# Patient Record
Sex: Male | Born: 1945 | Race: Black or African American | Hispanic: No | Marital: Single | Smoking: Current every day smoker
Health system: Southern US, Community
[De-identification: ages and names within clinical notes are randomized; demographics above are authoritative.]

## PROBLEM LIST (undated history)

## (undated) ENCOUNTER — Emergency Department (HOSPITAL_COMMUNITY): Admission: EM | Payer: Medicare Other | Source: Home / Self Care

## (undated) DIAGNOSIS — K219 Gastro-esophageal reflux disease without esophagitis: Secondary | ICD-10-CM

## (undated) DIAGNOSIS — R109 Unspecified abdominal pain: Secondary | ICD-10-CM

## (undated) DIAGNOSIS — I10 Essential (primary) hypertension: Secondary | ICD-10-CM

## (undated) DIAGNOSIS — IMO0001 Reserved for inherently not codable concepts without codable children: Secondary | ICD-10-CM

## (undated) DIAGNOSIS — R634 Abnormal weight loss: Secondary | ICD-10-CM

## (undated) DIAGNOSIS — M25569 Pain in unspecified knee: Secondary | ICD-10-CM

## (undated) DIAGNOSIS — F329 Major depressive disorder, single episode, unspecified: Secondary | ICD-10-CM

## (undated) DIAGNOSIS — F32A Depression, unspecified: Secondary | ICD-10-CM

## (undated) DIAGNOSIS — B1920 Unspecified viral hepatitis C without hepatic coma: Secondary | ICD-10-CM

## (undated) DIAGNOSIS — K769 Liver disease, unspecified: Secondary | ICD-10-CM

## (undated) DIAGNOSIS — R05 Cough: Secondary | ICD-10-CM

## (undated) DIAGNOSIS — F509 Eating disorder, unspecified: Secondary | ICD-10-CM

## (undated) DIAGNOSIS — R002 Palpitations: Secondary | ICD-10-CM

## (undated) DIAGNOSIS — I708 Atherosclerosis of other arteries: Secondary | ICD-10-CM

## (undated) DIAGNOSIS — M1990 Unspecified osteoarthritis, unspecified site: Secondary | ICD-10-CM

## (undated) DIAGNOSIS — F419 Anxiety disorder, unspecified: Secondary | ICD-10-CM

## (undated) HISTORY — DX: Essential (primary) hypertension: I10

## (undated) HISTORY — DX: Major depressive disorder, single episode, unspecified: F32.9

## (undated) HISTORY — DX: Pain in unspecified knee: M25.569

## (undated) HISTORY — DX: Eating disorder, unspecified: F50.9

## (undated) HISTORY — DX: Palpitations: R00.2

## (undated) HISTORY — DX: Unspecified abdominal pain: R10.9

## (undated) HISTORY — DX: Atherosclerosis of other arteries: I70.8

## (undated) HISTORY — DX: Abnormal weight loss: R63.4

## (undated) HISTORY — DX: Depression, unspecified: F32.A

## (undated) HISTORY — DX: Cough: R05

## (undated) HISTORY — DX: Liver disease, unspecified: K76.9

## (undated) HISTORY — DX: Unspecified viral hepatitis C without hepatic coma: B19.20

---

## 1988-12-13 HISTORY — PX: TUMOR REMOVAL: SHX12

## 1998-05-26 ENCOUNTER — Emergency Department (HOSPITAL_COMMUNITY): Admission: EM | Admit: 1998-05-26 | Discharge: 1998-05-26 | Payer: Self-pay | Admitting: Emergency Medicine

## 2000-05-24 ENCOUNTER — Ambulatory Visit (HOSPITAL_COMMUNITY): Admission: RE | Admit: 2000-05-24 | Discharge: 2000-05-24 | Payer: Self-pay | Admitting: Gastroenterology

## 2001-04-28 ENCOUNTER — Encounter: Admission: RE | Admit: 2001-04-28 | Discharge: 2001-04-28 | Payer: Self-pay | Admitting: *Deleted

## 2001-04-28 ENCOUNTER — Encounter: Payer: Self-pay | Admitting: *Deleted

## 2002-02-19 ENCOUNTER — Ambulatory Visit (HOSPITAL_COMMUNITY): Admission: RE | Admit: 2002-02-19 | Discharge: 2002-02-19 | Payer: Self-pay | Admitting: *Deleted

## 2005-01-19 ENCOUNTER — Encounter: Admission: RE | Admit: 2005-01-19 | Discharge: 2005-01-19 | Payer: Self-pay | Admitting: Internal Medicine

## 2005-02-16 ENCOUNTER — Encounter: Admission: RE | Admit: 2005-02-16 | Discharge: 2005-02-16 | Payer: Self-pay | Admitting: Internal Medicine

## 2005-07-13 ENCOUNTER — Ambulatory Visit: Payer: Self-pay | Admitting: Internal Medicine

## 2011-01-03 ENCOUNTER — Encounter: Payer: Self-pay | Admitting: Internal Medicine

## 2011-06-03 ENCOUNTER — Other Ambulatory Visit: Payer: Self-pay | Admitting: Gastroenterology

## 2011-06-03 ENCOUNTER — Ambulatory Visit (INDEPENDENT_AMBULATORY_CARE_PROVIDER_SITE_OTHER): Payer: Medicare Other | Admitting: Gastroenterology

## 2011-06-03 VITALS — BP 166/97 | HR 76 | Temp 97.7°F | Ht 71.0 in | Wt 174.0 lb

## 2011-06-03 DIAGNOSIS — B182 Chronic viral hepatitis C: Secondary | ICD-10-CM

## 2011-06-03 LAB — COMPREHENSIVE METABOLIC PANEL
ALT: 51 U/L (ref 0–53)
AST: 50 U/L — ABNORMAL HIGH (ref 0–37)
Albumin: 4.1 g/dL (ref 3.5–5.2)
Alkaline Phosphatase: 65 U/L (ref 39–117)
BUN: 13 mg/dL (ref 6–23)
CO2: 24 mEq/L (ref 19–32)
Calcium: 9.5 mg/dL (ref 8.4–10.5)
Chloride: 106 mEq/L (ref 96–112)
Creat: 0.8 mg/dL (ref 0.50–1.35)
Glucose, Bld: 93 mg/dL (ref 70–99)
Potassium: 4.3 mEq/L (ref 3.5–5.3)
Sodium: 139 mEq/L (ref 135–145)
Total Bilirubin: 0.5 mg/dL (ref 0.3–1.2)
Total Protein: 7.5 g/dL (ref 6.0–8.3)

## 2011-06-03 LAB — IRON AND TIBC
%SAT: 40 % (ref 20–55)
Iron: 147 ug/dL (ref 42–165)
TIBC: 372 ug/dL (ref 215–435)
UIBC: 225 ug/dL

## 2011-06-04 LAB — HEPATITIS A ANTIBODY, TOTAL: Hep A Total Ab: POSITIVE — AB

## 2011-06-04 LAB — HEPATITIS B SURFACE ANTIGEN: Hepatitis B Surface Ag: NEGATIVE

## 2011-06-04 LAB — TSH: TSH: 0.864 u[IU]/mL (ref 0.350–4.500)

## 2011-06-04 LAB — PROTIME-INR
INR: 0.93 (ref <1.50)
Prothrombin Time: 12.8 seconds (ref 11.6–15.2)

## 2011-06-04 LAB — HEPATITIS B CORE ANTIBODY, TOTAL: Hep B Core Total Ab: POSITIVE — AB

## 2011-06-04 LAB — ANA: Anti Nuclear Antibody(ANA): NEGATIVE

## 2011-06-04 LAB — FERRITIN: Ferritin: 1349 ng/mL — ABNORMAL HIGH (ref 22–322)

## 2011-06-04 LAB — HEPATITIS B SURFACE ANTIBODY,QUALITATIVE: Hep B S Ab: NEGATIVE

## 2011-06-04 LAB — AFP TUMOR MARKER: AFP-Tumor Marker: 16.5 ng/mL — ABNORMAL HIGH (ref 0.0–8.0)

## 2011-06-10 LAB — HEPATITIS C GENOTYPE

## 2011-06-10 NOTE — Progress Notes (Addendum)
NAME:  Ronald Wolf, Ronald Wolf  MR#:  [id number]      DATE:  06/03/2011  DOB:  04/20/1946    Referring Physician   cc: Dorothyann Peng MD, [facility], [address], [address], [place], [place]  [address]   Fax: [phone]  Larey Dresser, MD, [facility], [facility], [place]. [address], [address], [place], [place] [address]  Fax: [phone]   Reason for referral:  Genotype unknown hepatitis C virus.    History of present illness:  The patient is a 65 year old gentleman whom I have been asked by Dr. Allyne Gee to see in consultation regarding genotype unknown hepatitis C.  On 07/13/2005, the patient was seen at this clinic regarding his  hepatitis C.  The notes indicate that he was positive for the hepatitis C antibody in February 2006 with an ALT of 56. He reported that he had had hepatitis C viral testing the week prior to his  consultation on July 13, 2005, and therefore it was not drawn. Old records were requested, and it appears that the only laboratories that were done were his liver enzymes, but no viral load or genotyping were  done.  The patient was supposed to return in followup. There is no indication from the records as to what happened.  The patient claims that he never received a followup appointment.  He now returns at the request of Dr. Allyne Gee to discuss his hepatitis C. There are no symptoms referable to his history of hepatitis C, and there is some to suggest cryoglobulin mediated or decompensated liver  disease.  With respect to risk factors for liver disease, he reports drinking a pint of alcohol twice a week, with his last drink being Monday. He believes that this a significant reduction in his previous use. It  should be noted that his referral notes from 02/15/2011 state that he smelled of alcohol. He had 3 DWIs, the last being over 10 years ago. There is no history of intravenous or intranasal drug use, tattoos,   unsterile body piercing, but he thought that he believes he had a blood transfusion in 1990 when he underwent surgery for bladder  cancer.  There is no family history of liver disease, and he cannot recall being immunized against hepatitis A or B.   PAST MEDICAL HISTORY:  Significant for hypertension. 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 He continues to follow up with [facility] for  this. He denies any history of diabetes, dyslipidemia, coronary disease .   Past surgical history:  Resection of bladder cancer in 1990.    Past psychiatric history:  In the 1990s he saw a therapist to deal with the impact of his bladder cancer, but he denies any history of depression other than as a reaction to his bladder cancer. The notes of 07/13/2005 indicate that  he was treated with fluoxetine but stopped it as I believe it did not help him.   CURRENT MEDICATIONS:  1. Losartan 50 mg p.o. daily.  2. Nitrofurantoin 100 mg nightly to prevent bacterial infections through the ureteral conduit.   Allergies:  Denies.     Smoking:  Approximately a pack of cigarettes per day.    Alcohol:  As above.   FAMILY HISTORY:  As above.   SOCIAL HISTORY:  Divorced.  Has 6 children. 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   REVIEW OF SYSTEMS:  All 10 systems reviewed today with the patient.  Negative other than that which is mentioned above. CES-D was 30.   PHYSICAL EXAMINATION:  Constitutional:  Appeared stated age without significant bitemporal wasting.   Vital signs:  Height 71 inches, weight 124 pounds, blood pressure 166/97, pulse 76, temperature 97.7 degrees Fahrenheit.   Ears, nose, mouth and throat:  Unremarkable oropharynx.    neck:  No thyromegaly or neck masses.    Chest:  Resonant to percussion.  Clear to auscultation.    Cardiovascular:  Heart sounds normal.  S1, S2 without murmurs or rubs.   There is no peripheral edema.    abdomen:  Normal bowel sounds.  No masses or tenderness.  I could not appreciate a liver edge or spleen tip.  I could not appreciate any hernias.    Lymphatics:  No cervical or inguinal lymphadenopathy.    Central Nervous System:  No asterixis or focal neurologic findings.    Dermatologic:  Anicteric without palmar erythema or spider angiomata.    Eyes:  Anicteric sclerae.  Pupils are equal and reactive to light.   LABORATORY STUDIES:  From 02/15/2011, creatinine was 0.68.  AST 92, ALT 77, ALP 86, total bilirubin 0.6, albumin 4.3.  Triglycerides 166. GGG 427.  From  02/25/2010, platelet count was 288. Albumin 3.9, globulin 3.8, AST 60, ALT 54, ALP 88, total bilirubin 0.8, creatinine was 0.8.   IMAGING STUDIES:  Last imaging of the liver on 02/16/2005 with contrast showed 2 low-attenuation lesion within liver and a third smaller lesion near the capsule in the left hepatic lobe, which were indeterminate in nature  but thought to probably be cysts.  There is also hyperdensity within the liver suggesting fatty infiltration.    assessment:  The patient is a 65 year old gentleman with a history of genotype unknown hepatitis C virus, with a history of significant alcohol use as well. I have concerns about his ongoing alcohol use, such that it  may preclude him from being considered for treatment.  In addition, he has been lost to followup for 6 years now, so I need to see how compliant he will be, because compliance with therapy is key to  success.  There is a history of depression in the past, and one wonders if he is self-medicating with alcohol.  In my discussion today with the patient, we discussed the nature and natural history of hepatitis C. We discussed the significance of genotyping.  We discussed the role of biopsy for genotype 1. We  discussed treatment with pegylated interferon and ribavirin for all genotypes, and a protease inhibitor in addition for  genotype 1 patients.  I reviewed the success rates, our treatment protocol, and  the side effects of treatment. We also discussed the risks of contagion.  I discussed the interactions with alcohol use and abuse  and cirrhosis from hepatitis C.  I have told him that he must completely abstain from alcohol.   PLAN:  1. Standard laboratories including genotyping him. 2. If genotype 1, will consider proceeding to liver biopsy to demonstrate the significance of disease and then bring him back to clinic to followup. 3. If genotype non-1, will bring back to clinic to followup to see if he has made any progress with his abstinence as well as compliance with follow up. 4. Literature on hepatitis C given.              Brooke Dare, MD  ADDENDUM: Genotype 1a.  Will book for biopsy.   Hepatitis A immune.   Total Hepatitis B core Ab positive - will consider this a true positive and not immunize.  Ferritin 1349, likely considering his iron saturation of 40%, a reflection of HCV and alcoholic liver disease.  Will assess iron on biopsy.  403 .[id number]  D:  Thu Jun 21 20:26:47 2012 ; T:  Sat Jun 23 14:24:48 2012  Job #:  [id number]

## 2012-02-08 ENCOUNTER — Other Ambulatory Visit: Payer: Self-pay | Admitting: Gastroenterology

## 2012-02-08 DIAGNOSIS — R6881 Early satiety: Secondary | ICD-10-CM

## 2012-02-08 DIAGNOSIS — R1013 Epigastric pain: Secondary | ICD-10-CM

## 2012-02-08 DIAGNOSIS — R634 Abnormal weight loss: Secondary | ICD-10-CM

## 2012-02-15 ENCOUNTER — Other Ambulatory Visit (HOSPITAL_COMMUNITY): Payer: Medicare Other

## 2012-04-13 ENCOUNTER — Other Ambulatory Visit (HOSPITAL_COMMUNITY): Payer: Self-pay | Admitting: Urology

## 2012-04-13 ENCOUNTER — Other Ambulatory Visit (HOSPITAL_COMMUNITY): Payer: Medicare Other

## 2012-04-13 DIAGNOSIS — Z8551 Personal history of malignant neoplasm of bladder: Secondary | ICD-10-CM

## 2012-04-14 ENCOUNTER — Ambulatory Visit (HOSPITAL_COMMUNITY)
Admission: RE | Admit: 2012-04-14 | Discharge: 2012-04-14 | Disposition: A | Discharge status: Home / Self Care | Payer: Medicare Other | Source: Ambulatory Visit | Attending: Urology | Admitting: Urology

## 2012-04-14 DIAGNOSIS — Z9889 Other specified postprocedural states: Secondary | ICD-10-CM | POA: Insufficient documentation

## 2012-04-14 DIAGNOSIS — Z8551 Personal history of malignant neoplasm of bladder: Secondary | ICD-10-CM

## 2012-04-14 DIAGNOSIS — C679 Malignant neoplasm of bladder, unspecified: Secondary | ICD-10-CM | POA: Insufficient documentation

## 2012-05-22 ENCOUNTER — Encounter: Payer: Self-pay | Admitting: Internal Medicine

## 2012-05-22 ENCOUNTER — Ambulatory Visit (INDEPENDENT_AMBULATORY_CARE_PROVIDER_SITE_OTHER): Payer: Medicare Other | Admitting: Internal Medicine

## 2012-05-22 VITALS — BP 124/72 | HR 79 | Temp 98.4°F | Resp 18 | Ht 70.0 in | Wt 161.0 lb

## 2012-05-22 DIAGNOSIS — B1920 Unspecified viral hepatitis C without hepatic coma: Secondary | ICD-10-CM

## 2012-05-22 DIAGNOSIS — G500 Trigeminal neuralgia: Secondary | ICD-10-CM

## 2012-05-22 DIAGNOSIS — C679 Malignant neoplasm of bladder, unspecified: Secondary | ICD-10-CM

## 2012-05-22 MED ORDER — CARBAMAZEPINE ER 100 MG PO TB12: 100.0000 mg | ORAL_TABLET | Freq: Two times a day (BID) | ORAL | Status: DC

## 2012-05-28 DIAGNOSIS — G500 Trigeminal neuralgia: Secondary | ICD-10-CM | POA: Insufficient documentation

## 2012-05-28 DIAGNOSIS — C679 Malignant neoplasm of bladder, unspecified: Secondary | ICD-10-CM | POA: Insufficient documentation

## 2012-05-28 DIAGNOSIS — B1920 Unspecified viral hepatitis C without hepatic coma: Secondary | ICD-10-CM | POA: Insufficient documentation

## 2012-05-28 NOTE — Progress Notes (Signed)
  Subjective:    Patient ID: Ronald Wolf, male    DOB: 1946-01-21, 66 y.o.   MRN: [id number]  HPI Pt presents to clinic for evaluation of facial pain. Notes chronic intermittent for 15 years of severe facial pain. Pain in unilateral left sided and lasts 20-30 secs. Occurs every few months and when active will be daily. Has attempted medication in the past but nothing currently. Also h/o hep c s/p ID with no follow up. Has not undergone treatment but it was being discussed. Has PMD and was unaware he was seeing IM physician today- was referred by a friend. No other complaints.  Past Medical History  Diagnosis Date  . Depression   . Eating disorder   . Hepatitis C   . Hypertension    Past Surgical History  Procedure Date  . Tumor removal 1990    bladder    reports that he has been smoking.  He has never used smokeless tobacco. He reports that he drinks alcohol. He reports that he does not use illicit drugs. family history includes Cancer in his brother; Diabetes in his mother; and Healthy in his sister.  There is no history of Prostate cancer, and Colon cancer, and Breast cancer, and Heart disease, and Hypertension, . Allergies  Allergen Reactions  . Iohexol      Desc: pt broke out in hives years ago from iv contrast. he was given 50mg  benadryl po, 1 hr prior to ct and did fine today w/o complications.  JB      Review of Systems  Neurological: Negative for dizziness, facial asymmetry, speech difficulty, weakness, numbness and headaches.  All other systems reviewed and are negative.       Objective:   Physical Exam  Nursing note and vitals reviewed. Constitutional: He appears well-developed and well-nourished. No distress.  HENT:  Head: Normocephalic and atraumatic.  Right Ear: External ear normal.  Left Ear: External ear normal.  Eyes: Conjunctivae are normal. No scleral icterus.  Neck: Neck supple.  Cardiovascular: Normal rate, regular rhythm and normal heart sounds.     Pulmonary/Chest: Effort normal and breath sounds normal.  Lymphadenopathy:    He has no cervical adenopathy.  Neurological: He is alert. No cranial nerve deficit.  Skin: Skin is warm and dry. No rash noted. He is not diaphoretic. No erythema.  Psychiatric: He has a normal mood and affect.          Assessment & Plan:

## 2012-05-28 NOTE — Assessment & Plan Note (Signed)
Begin tegretol. Schedule neurology consult. May f/u with pmd or here

## 2012-05-28 NOTE — Assessment & Plan Note (Signed)
Schedule follow up with ID

## 2012-06-12 ENCOUNTER — Other Ambulatory Visit: Payer: Self-pay | Admitting: Internal Medicine

## 2012-06-12 ENCOUNTER — Ambulatory Visit (INDEPENDENT_AMBULATORY_CARE_PROVIDER_SITE_OTHER): Payer: Medicare Other | Admitting: Internal Medicine

## 2012-06-12 ENCOUNTER — Encounter: Payer: Self-pay | Admitting: Internal Medicine

## 2012-06-12 VITALS — BP 142/76 | HR 84 | Temp 98.8°F | Resp 16 | Wt 168.2 lb

## 2012-06-12 DIAGNOSIS — Z79899 Other long term (current) drug therapy: Secondary | ICD-10-CM

## 2012-06-12 DIAGNOSIS — G500 Trigeminal neuralgia: Secondary | ICD-10-CM

## 2012-06-12 DIAGNOSIS — I10 Essential (primary) hypertension: Secondary | ICD-10-CM

## 2012-06-12 DIAGNOSIS — B1920 Unspecified viral hepatitis C without hepatic coma: Secondary | ICD-10-CM

## 2012-06-12 LAB — CBC WITH DIFFERENTIAL/PLATELET
Basophils Absolute: 0 10*3/uL (ref 0.0–0.1)
Basophils Relative: 1 % (ref 0–1)
Eosinophils Absolute: 0.1 10*3/uL (ref 0.0–0.7)
Eosinophils Relative: 2 % (ref 0–5)
HCT: 36.4 % — ABNORMAL LOW (ref 39.0–52.0)
Hemoglobin: 12.5 g/dL — ABNORMAL LOW (ref 13.0–17.0)
Lymphocytes Relative: 51 % — ABNORMAL HIGH (ref 12–46)
Lymphs Abs: 3.5 10*3/uL (ref 0.7–4.0)
MCH: 34.9 pg — ABNORMAL HIGH (ref 26.0–34.0)
MCHC: 34.3 g/dL (ref 30.0–36.0)
MCV: 101.7 fL — ABNORMAL HIGH (ref 78.0–100.0)
Monocytes Absolute: 0.9 10*3/uL (ref 0.1–1.0)
Monocytes Relative: 14 % — ABNORMAL HIGH (ref 3–12)
Neutro Abs: 2.3 10*3/uL (ref 1.7–7.7)
Neutrophils Relative %: 32 % — ABNORMAL LOW (ref 43–77)
Platelets: 266 10*3/uL (ref 150–400)
RBC: 3.58 MIL/uL — ABNORMAL LOW (ref 4.22–5.81)
RDW: 12.4 % (ref 11.5–15.5)
WBC: 6.8 10*3/uL (ref 4.0–10.5)

## 2012-06-12 LAB — HEPATIC FUNCTION PANEL
ALT: 36 U/L (ref 0–53)
AST: 36 U/L (ref 0–37)
Albumin: 3.9 g/dL (ref 3.5–5.2)
Alkaline Phosphatase: 60 U/L (ref 39–117)
Bilirubin, Direct: 0.1 mg/dL (ref 0.0–0.3)
Indirect Bilirubin: 0.4 mg/dL (ref 0.0–0.9)
Total Bilirubin: 0.5 mg/dL (ref 0.3–1.2)
Total Protein: 7.2 g/dL (ref 6.0–8.3)

## 2012-06-12 MED ORDER — CARBAMAZEPINE ER 100 MG PO TB12: 100.0000 mg | ORAL_TABLET | Freq: Two times a day (BID) | ORAL | Status: DC

## 2012-06-12 MED ORDER — LOSARTAN POTASSIUM 100 MG PO TABS: 100.0000 mg | ORAL_TABLET | Freq: Every day | ORAL | Status: DC

## 2012-06-12 NOTE — Progress Notes (Signed)
  Subjective:    Patient ID: Ronald Wolf, male    DOB: 1946/04/20, 66 y.o.   MRN: [id number]  HPI Pt presents to clinic for followup of multiple medical problems. Last visit began tegretol for suspected trigeminal neuralgia. Tolerates with mild intermittent dizziness. No syncope or falls. States facial pain entirely resolved since beginning medication-has only mild intermittent tingling. Neurology appt pending next week. H/o hep c and referral to re-establish with ID pending. BP mildly elevated and states just ran out of arb. Last lab work in January.   Past Medical History  Diagnosis Date  . Depression   . Eating disorder   . Hepatitis C   . Hypertension    Past Surgical History  Procedure Date  . Tumor removal 1990    bladder    reports that he has been smoking.  He has never used smokeless tobacco. He reports that he drinks alcohol. He reports that he does not use illicit drugs. family history includes Cancer in his brother; Diabetes in his mother; and Healthy in his sister.  There is no history of Prostate cancer, and Colon cancer, and Breast cancer, and Heart disease, and Hypertension, . Allergies  Allergen Reactions  . Iohexol      Desc: pt broke out in hives years ago from iv contrast. he was given 50mg  benadryl po, 1 hr prior to ct and did fine today w/o complications.  JB       Review of Systems see hpi     Objective:   Physical Exam  Nursing note and vitals reviewed. Constitutional: He appears well-developed and well-nourished. No distress.  HENT:  Head: Normocephalic and atraumatic.  Neurological: He is alert.  Skin: He is not diaphoretic.  Psychiatric: He has a normal mood and affect.          Assessment & Plan:

## 2012-06-12 NOTE — Assessment & Plan Note (Signed)
RF losartan. Obtain cbc, chem7, tsh.

## 2012-06-12 NOTE — Assessment & Plan Note (Signed)
ID appt pending. Obtain lft

## 2012-06-12 NOTE — Assessment & Plan Note (Signed)
Improved. Continue tegretol. Keep neurology appt given 15+ year hx of sx's.

## 2012-06-13 LAB — BASIC METABOLIC PANEL
BUN: 22 mg/dL (ref 6–23)
CO2: 18 mEq/L — ABNORMAL LOW (ref 19–32)
Calcium: 9.1 mg/dL (ref 8.4–10.5)
Chloride: 108 mEq/L (ref 96–112)
Creat: 1.01 mg/dL (ref 0.50–1.35)
Glucose, Bld: 101 mg/dL — ABNORMAL HIGH (ref 70–99)
Potassium: 4.2 mEq/L (ref 3.5–5.3)
Sodium: 139 mEq/L (ref 135–145)

## 2012-06-13 LAB — TSH: TSH: 2.329 u[IU]/mL (ref 0.350–4.500)

## 2012-06-16 ENCOUNTER — Telehealth: Payer: Self-pay | Admitting: *Deleted

## 2012-06-16 DIAGNOSIS — D531 Other megaloblastic anemias, not elsewhere classified: Secondary | ICD-10-CM

## 2012-06-16 LAB — VITAMIN B12

## 2012-06-16 NOTE — Telephone Encounter (Signed)
Message copied by Regis Bill on Fri Jun 16, 2012  1:07 PM ------      Message from: Edwyna Perfect      Created: Wed Jun 14, 2012 11:57 PM       pls see if [facility] can add vit b12 dx-megaloblastic anemia. Labs ok

## 2012-06-16 NOTE — Telephone Encounter (Signed)
Patient informed; will call when B12 lab results are received; per [facility], Vitamin B12 lab Dx: megaloblastic anemia has been added on to previous blood work/SLS

## 2012-06-19 NOTE — Telephone Encounter (Signed)
Can it be ordered with next visit pls

## 2012-06-19 NOTE — Telephone Encounter (Signed)
No future labs pending, will order at next office appt per Provider.

## 2012-06-19 NOTE — Telephone Encounter (Signed)
Received call from Sherri at [facility] stating they are unable to add B12 level as there was not enough specimen to perform the test.  Do we need to call pt back to the lab to complete this test?

## 2012-07-18 ENCOUNTER — Encounter: Payer: Self-pay | Admitting: Internal Medicine

## 2012-07-18 ENCOUNTER — Ambulatory Visit (HOSPITAL_BASED_OUTPATIENT_CLINIC_OR_DEPARTMENT_OTHER)
Admission: RE | Admit: 2012-07-18 | Discharge: 2012-07-18 | Disposition: A | Discharge status: Home / Self Care | Payer: Medicare Other | Source: Ambulatory Visit | Attending: Internal Medicine | Admitting: Internal Medicine

## 2012-07-18 ENCOUNTER — Other Ambulatory Visit: Payer: Self-pay | Admitting: Internal Medicine

## 2012-07-18 ENCOUNTER — Ambulatory Visit (INDEPENDENT_AMBULATORY_CARE_PROVIDER_SITE_OTHER): Payer: Medicare Other | Admitting: Internal Medicine

## 2012-07-18 VITALS — BP 118/64 | HR 88 | Temp 98.4°F | Resp 16 | Ht 71.0 in | Wt 163.0 lb

## 2012-07-18 DIAGNOSIS — R200 Anesthesia of skin: Secondary | ICD-10-CM

## 2012-07-18 DIAGNOSIS — R209 Unspecified disturbances of skin sensation: Secondary | ICD-10-CM | POA: Insufficient documentation

## 2012-07-18 DIAGNOSIS — Z1389 Encounter for screening for other disorder: Secondary | ICD-10-CM

## 2012-07-18 DIAGNOSIS — I10 Essential (primary) hypertension: Secondary | ICD-10-CM

## 2012-07-18 DIAGNOSIS — Z8551 Personal history of malignant neoplasm of bladder: Secondary | ICD-10-CM | POA: Insufficient documentation

## 2012-07-18 DIAGNOSIS — G500 Trigeminal neuralgia: Secondary | ICD-10-CM

## 2012-07-18 DIAGNOSIS — B1920 Unspecified viral hepatitis C without hepatic coma: Secondary | ICD-10-CM | POA: Insufficient documentation

## 2012-07-18 DIAGNOSIS — R6884 Jaw pain: Secondary | ICD-10-CM | POA: Insufficient documentation

## 2012-07-18 MED ORDER — GADOBENATE DIMEGLUMINE 529 MG/ML IV SOLN
15.0000 mL | Freq: Once | INTRAVENOUS | Status: AC | PRN
  Administered 2012-07-18: 15 mL via INTRAVENOUS

## 2012-07-18 MED ORDER — CARBAMAZEPINE ER 100 MG PO TB12: 100.0000 mg | ORAL_TABLET | Freq: Two times a day (BID) | ORAL | Status: DC

## 2012-07-18 NOTE — Progress Notes (Signed)
  Subjective:    Patient ID: Ronald Wolf, male    DOB: September 26, 1946, 66 y.o.   MRN: [id number]  HPI Pt presents to clinic for followup of multiple medical problems. Notes previously improved unilateral facial pain and numbness has now worsened despite tegretol. Now radiates to his forehead. Remains left sided and does not cross the midline. Duration of sx's has been years. Tolerating tegretol without side effects. No other neurologic sx's. Did not keep appointment with neurology.BP reviewed normotensive and tolerating ARB without side effects.  Past Medical History  Diagnosis Date  . Depression   . Eating disorder   . Hepatitis C   . Hypertension    Past Surgical History  Procedure Date  . Tumor removal 1990    bladder    reports that he has been smoking.  He has never used smokeless tobacco. He reports that he drinks alcohol. He reports that he does not use illicit drugs. family history includes Cancer in his brother; Diabetes in his mother; and Healthy in his sister.  There is no history of Prostate cancer, and Colon cancer, and Breast cancer, and Heart disease, and Hypertension, . Allergies  Allergen Reactions  . Iohexol      Desc: pt broke out in hives years ago from iv contrast. he was given 50mg  benadryl po, 1 hr prior to ct and did fine today w/o complications.  JB       Review of Systems see hpi     Objective:   Physical Exam  Nursing note and vitals reviewed. Constitutional: He appears well-developed and well-nourished.  HENT:  Head: Normocephalic and atraumatic.  Right Ear: External ear normal.  Left Ear: External ear normal.  Eyes: Conjunctivae and EOM are normal. No scleral icterus.  Neurological: He is alert.  Psychiatric: He has a normal mood and affect.          Assessment & Plan:

## 2012-07-18 NOTE — Patient Instructions (Addendum)
We are in the process of scheduling an MRI of the brain and a neurology appointment for you

## 2012-07-19 DIAGNOSIS — R200 Anesthesia of skin: Secondary | ICD-10-CM | POA: Insufficient documentation

## 2012-07-19 NOTE — Assessment & Plan Note (Signed)
Suspected trigeminal neuralgia however chronicity and worsening sx's. Proceed with MRI of head and reschedule neurology consult. Continue tegretol.

## 2012-07-19 NOTE — Assessment & Plan Note (Signed)
Normotensive and stable. Continue current regimen. Monitor bp as outpt and followup in clinic as scheduled.  

## 2012-09-14 ENCOUNTER — Ambulatory Visit (HOSPITAL_BASED_OUTPATIENT_CLINIC_OR_DEPARTMENT_OTHER)
Admission: RE | Admit: 2012-09-14 | Discharge: 2012-09-14 | Disposition: A | Discharge status: Home / Self Care | Payer: Medicare Other | Source: Ambulatory Visit | Attending: Internal Medicine | Admitting: Internal Medicine

## 2012-09-14 ENCOUNTER — Encounter: Payer: Self-pay | Admitting: Internal Medicine

## 2012-09-14 ENCOUNTER — Ambulatory Visit (INDEPENDENT_AMBULATORY_CARE_PROVIDER_SITE_OTHER): Payer: Medicare Other | Admitting: Internal Medicine

## 2012-09-14 VITALS — BP 136/80 | HR 79 | Temp 97.8°F | Resp 16 | Ht 70.0 in | Wt 170.0 lb

## 2012-09-14 DIAGNOSIS — R079 Chest pain, unspecified: Secondary | ICD-10-CM

## 2012-09-14 DIAGNOSIS — D531 Other megaloblastic anemias, not elsewhere classified: Secondary | ICD-10-CM

## 2012-09-14 DIAGNOSIS — G500 Trigeminal neuralgia: Secondary | ICD-10-CM

## 2012-09-14 DIAGNOSIS — Z125 Encounter for screening for malignant neoplasm of prostate: Secondary | ICD-10-CM

## 2012-09-14 DIAGNOSIS — Z23 Encounter for immunization: Secondary | ICD-10-CM

## 2012-09-14 DIAGNOSIS — D539 Nutritional anemia, unspecified: Secondary | ICD-10-CM

## 2012-09-14 DIAGNOSIS — M899 Disorder of bone, unspecified: Secondary | ICD-10-CM

## 2012-09-14 DIAGNOSIS — D649 Anemia, unspecified: Secondary | ICD-10-CM

## 2012-09-14 LAB — CBC WITH DIFFERENTIAL/PLATELET
Basophils Absolute: 0 10*3/uL (ref 0.0–0.1)
Basophils Relative: 0 % (ref 0–1)
Eosinophils Absolute: 0.1 10*3/uL (ref 0.0–0.7)
Eosinophils Relative: 2 % (ref 0–5)
HCT: 36.8 % — ABNORMAL LOW (ref 39.0–52.0)
Hemoglobin: 12.3 g/dL — ABNORMAL LOW (ref 13.0–17.0)
Lymphocytes Relative: 47 % — ABNORMAL HIGH (ref 12–46)
Lymphs Abs: 3.2 10*3/uL (ref 0.7–4.0)
MCH: 35.5 pg — ABNORMAL HIGH (ref 26.0–34.0)
MCHC: 33.4 g/dL (ref 30.0–36.0)
MCV: 106.4 fL — ABNORMAL HIGH (ref 78.0–100.0)
Monocytes Absolute: 1 10*3/uL (ref 0.1–1.0)
Monocytes Relative: 14 % — ABNORMAL HIGH (ref 3–12)
Neutro Abs: 2.5 10*3/uL (ref 1.7–7.7)
Neutrophils Relative %: 37 % — ABNORMAL LOW (ref 43–77)
Platelets: 264 10*3/uL (ref 150–400)
RBC: 3.46 MIL/uL — ABNORMAL LOW (ref 4.22–5.81)
RDW: 12.5 % (ref 11.5–15.5)
WBC: 6.7 10*3/uL (ref 4.0–10.5)

## 2012-09-14 LAB — VITAMIN B12: Vitamin B-12: 1392 pg/mL — ABNORMAL HIGH (ref 211–911)

## 2012-09-14 LAB — FERRITIN: Ferritin: 1649 ng/mL — ABNORMAL HIGH (ref 22–322)

## 2012-09-14 NOTE — Patient Instructions (Addendum)
We are in the process of scheduling a heart stress test for you

## 2012-09-15 LAB — PSA, MEDICARE: PSA: 0.02 ng/mL (ref <=4.00)

## 2012-09-17 DIAGNOSIS — M899 Disorder of bone, unspecified: Secondary | ICD-10-CM | POA: Insufficient documentation

## 2012-09-17 DIAGNOSIS — R079 Chest pain, unspecified: Secondary | ICD-10-CM | POA: Insufficient documentation

## 2012-09-17 DIAGNOSIS — D649 Anemia, unspecified: Secondary | ICD-10-CM | POA: Insufficient documentation

## 2012-09-17 NOTE — Assessment & Plan Note (Signed)
Obtain PSA. Discussed potential bone scan and we'll readdress at close followup after cardiac evaluation

## 2012-09-17 NOTE — Assessment & Plan Note (Addendum)
Reschedule neurology consult

## 2012-09-17 NOTE — Progress Notes (Signed)
  Subjective:    Patient ID: Ronald Wolf, male    DOB: September 15, 1946, 66 y.o.   MRN: [id number]  HPI Pt presents to clinic for followup of multiple medical problems. Continues to have neuropathic pain felt to be trigeminal  neuralgia of left face. Two attempts to refer to neurology the patient has not kept appointment. Willing to have appointment rescheduled. 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 States colonoscopy approximately 3 years ago. Also notes left sided chest pain intermittently occurring sometimes at night. Has associated left arm numbness. Symptoms are nonexertional. There is been at least one occasion where belching or taking TUMS helped. Denies GERD symptoms.  Past Medical History  Diagnosis Date  . Depression   . Eating disorder   . Hepatitis C   . Hypertension    Past Surgical History  Procedure Date  . Tumor removal 1990    bladder    reports that he has been smoking.  He has never used smokeless tobacco. He reports that he drinks alcohol. He reports that he does not use illicit drugs. family history includes Cancer in his brother; Diabetes in his mother; and Healthy in his sister.  There is no history of Prostate cancer, and Colon cancer, and Breast cancer, and Heart disease, and Hypertension, . Allergies  Allergen Reactions  . Iohexol      Desc: pt broke out in hives years ago from iv contrast. he was given 50mg  benadryl po, 1 hr prior to ct and did fine today w/o complications.  JB       Review of Systems see history of present illness     Objective:   Physical Exam  Nursing note and vitals reviewed. Constitutional: He appears well-developed and well-nourished. No distress.  HENT:  Head: Normocephalic and atraumatic.  Right Ear: External ear normal.  Eyes: Conjunctivae normal are normal. No scleral icterus.  Neck: Neck supple.  Cardiovascular: Normal rate, regular rhythm and normal heart sounds.  Exam reveals no  gallop and no friction rub.   No murmur heard. Pulmonary/Chest: Effort normal and breath sounds normal. No respiratory distress. He has no wheezes. He has no rales.  Neurological: He is alert.  Skin: Skin is warm and dry. He is not diaphoretic.  Psychiatric: He has a normal mood and affect.          Assessment & Plan:

## 2012-09-17 NOTE — Assessment & Plan Note (Signed)
Obtain CBC B12 and iron level

## 2012-09-17 NOTE — Assessment & Plan Note (Signed)
Does have both typical and atypical features. EKG obtained demonstrates normal sinus rhythm with a rate of seventy-seven. Normal axis and interval. No evidence of acute ischemic change. Obtain chest x-ray and schedule nuclear stress test. Given samples of Nexium 40 mg daily. Close followup scheduled

## 2012-09-21 ENCOUNTER — Other Ambulatory Visit: Payer: Self-pay | Admitting: Internal Medicine

## 2012-09-21 DIAGNOSIS — R079 Chest pain, unspecified: Secondary | ICD-10-CM

## 2012-09-29 ENCOUNTER — Telehealth: Payer: Self-pay | Admitting: *Deleted

## 2012-09-29 NOTE — Telephone Encounter (Signed)
Pt called stating tegretol is no longer helping his facial pain. He reports that he took 5 of them yesterday with no relief. Pt states the pain is almost unbearable and wants to know what else he can do. Pt reports that he doesn't want to go back to previous neurology group because they did not help him before.  Please advise.

## 2012-10-02 ENCOUNTER — Other Ambulatory Visit: Payer: Self-pay | Admitting: Internal Medicine

## 2012-10-02 DIAGNOSIS — G518 Other disorders of facial nerve: Secondary | ICD-10-CM

## 2012-10-02 NOTE — Telephone Encounter (Signed)
Referral order placed.

## 2012-10-02 NOTE — Telephone Encounter (Signed)
Per VO, [facility], pt can get new referral to Neurology only if he keeps this appointment made, has had previous missed OVs that were scheduled and there will be no future referrals if this is not complied with/SLS

## 2012-10-02 NOTE — Telephone Encounter (Signed)
Patient states that he does want to be referred to a neurologist but not the one that Dr. Rodena Medin has previously referred him to.

## 2012-10-05 ENCOUNTER — Ambulatory Visit (INDEPENDENT_AMBULATORY_CARE_PROVIDER_SITE_OTHER): Payer: Medicare Other | Admitting: Internal Medicine

## 2012-10-05 ENCOUNTER — Encounter: Payer: Medicare Other | Admitting: Physician Assistant

## 2012-10-05 ENCOUNTER — Encounter: Payer: Self-pay | Admitting: Internal Medicine

## 2012-10-05 DIAGNOSIS — G500 Trigeminal neuralgia: Secondary | ICD-10-CM

## 2012-10-05 DIAGNOSIS — M898X1 Other specified disorders of bone, shoulder: Secondary | ICD-10-CM | POA: Insufficient documentation

## 2012-10-05 DIAGNOSIS — J310 Chronic rhinitis: Secondary | ICD-10-CM

## 2012-10-05 DIAGNOSIS — G8929 Other chronic pain: Secondary | ICD-10-CM | POA: Insufficient documentation

## 2012-10-05 DIAGNOSIS — I10 Essential (primary) hypertension: Secondary | ICD-10-CM

## 2012-10-05 DIAGNOSIS — R079 Chest pain, unspecified: Secondary | ICD-10-CM

## 2012-10-05 DIAGNOSIS — M899 Disorder of bone, unspecified: Secondary | ICD-10-CM

## 2012-10-05 LAB — IRON: Iron: 151 ug/dL (ref 42–165)

## 2012-10-05 MED ORDER — TRAMADOL HCL 50 MG PO TABS: ORAL_TABLET | ORAL | Status: AC

## 2012-10-05 MED ORDER — FLUTICASONE PROPIONATE 50 MCG/ACT NA SUSP: NASAL | Status: DC

## 2012-10-05 MED ORDER — DICLOFENAC SODIUM 75 MG PO TBEC: DELAYED_RELEASE_TABLET | ORAL | Status: AC

## 2012-10-05 MED ORDER — AMLODIPINE BESYLATE 5 MG PO TABS: ORAL_TABLET | ORAL | Status: DC

## 2012-10-05 MED ORDER — TRAMADOL HCL 50 MG PO TABS: ORAL_TABLET | ORAL | Status: DC

## 2012-10-05 NOTE — Assessment & Plan Note (Signed)
Temporary Ultram when necessary for pain. Keep neurology appointment. Cranial MRI without evidence of mass.

## 2012-10-05 NOTE — Assessment & Plan Note (Signed)
Attempt Flonase daily

## 2012-10-05 NOTE — Assessment & Plan Note (Signed)
Provided with additional Nexium samples and encouraged to take daily. Stress test scheduled

## 2012-10-05 NOTE — Assessment & Plan Note (Signed)
At next visit discussed possible bone scan for followup of indeterminate C2 area noted on MRI

## 2012-10-05 NOTE — Progress Notes (Signed)
  Subjective:    Patient ID: Ronald Wolf, male    DOB: 10/30/1946, 66 y.o.   MRN: [id number]  HPI Pt presents to clinic for followup of multiple medical problems. Blood pressure mildly elevated however states has not taken medication today. Home monitoring demonstrates systolic blood pressure in the 140's range. Compliant with losartan daily without adverse effect. Initially scheduled for nuclear stress test due to intermittent chest pain however test was denied by his insurance company. Has been rescheduled for EST and currently pending. Taking Nexium samples intermittently with some possible improvement of discomfort. Complains of chronic nasal drainage described as clear and predominantly the left side. Has intermittent left maxillary sinus discomfort. Also complains of chronic intermittent left scapular pain described as a burning. Denies neck pain, radicular arm pain, shoulder pain or pain with movement. Taking no medication for the problem. No alleviating or exacerbating factors. Chronic facial pain polyps of the neuralgia now worsening. Has not kept multiple referral appointments for neurology. Appointment remade for next week. Compliant with Tegretol daily.  Past Medical History  Diagnosis Date  . Depression   . Eating disorder   . Hepatitis C   . Hypertension    Past Surgical History  Procedure Date  . Tumor removal 1990    bladder    reports that he has been smoking.  He has never used smokeless tobacco. He reports that he drinks alcohol. He reports that he does not use illicit drugs. family history includes Cancer in his brother; Diabetes in his mother; and Healthy in his sister.  There is no history of Prostate cancer, and Colon cancer, and Breast cancer, and Heart disease, and Hypertension, . Allergies  Allergen Reactions  . Iohexol      Desc: pt broke out in hives years ago from iv contrast. he was given 50mg  benadryl po, 1 hr prior to ct and did fine today w/o complications.   JB       Review of Systems see hpi     Objective:   Physical Exam  Nursing note and vitals reviewed. Constitutional: He appears well-developed and well-nourished. No distress.  HENT:  Head: Normocephalic and atraumatic.  Musculoskeletal:       Left scapula nontender. No bony abnormality. Full range of motion left shoulder with intermittent crepitus. No pain with abduction with passive or active resistance.  Neurological: He is alert.  Skin: He is not diaphoretic.  Psychiatric: He has a normal mood and affect.          Assessment & Plan:

## 2012-10-05 NOTE — Assessment & Plan Note (Signed)
Chronically elevated ferritin.? Acute phase reactant. Check iron level

## 2012-10-05 NOTE — Assessment & Plan Note (Signed)
Suboptimal control. Add Norvasc 5 mg a day. Close followup scheduled. monitor blood pressure as an outpatient as well

## 2012-10-05 NOTE — Assessment & Plan Note (Signed)
Discussed cervical vs. shoulder etiology. Attempt Voltaren with food and no other anti-inflammatory. Followup if no improvement or worsening.

## 2012-10-18 ENCOUNTER — Ambulatory Visit (INDEPENDENT_AMBULATORY_CARE_PROVIDER_SITE_OTHER): Payer: Medicare Other | Admitting: Physician Assistant

## 2012-10-18 DIAGNOSIS — R079 Chest pain, unspecified: Secondary | ICD-10-CM

## 2012-10-18 NOTE — Progress Notes (Signed)
Ronald Wolf is a 66 y.o. male referred by PCP for ETT due to chest pain.  He has a hx of HTN.  No hx of DM2,HL.  He is a smoker.  No FHx of CAD.  Notes left sided chest pain for the last 1-2 months.  Not exertional. Notes DOE.  No syncope.  Exam unremarkable.  Of note, baseline BP high, but he has not taken his medications today.  Exercise Treadmill Test  Pre-Exercise Testing Evaluation Rhythm: normal sinus  Rate: 71   PR:  .17 QRS:  .10  QT:  .38 QTc: .41           Test  Exercise Tolerance Test Ordering MD: Charlynn Court  Interpreting MD: [name], PA-C  Unique Test No: 1  Treadmill:  1  Indication for ETT: chest pain - rule out ischemia  Contraindication to ETT: No   Stress Modality: exercise - treadmill  Cardiac Imaging Performed: non   Protocol: standard Bruce - maximal  Max BP:  210/97  Max MPHR (bpm):  154 85% MPR (bpm):  131  MPHR obtained (bpm):  148 % MPHR obtained:  96%  Reached 85% MPHR (min:sec):  1:50 Total Exercise Time (min-sec):  3:01  Workload in METS:  4.6 Borg Scale: 13  Reason ETT Terminated:  desired heart rate attained; hypertensive response    ST Segment Analysis At Rest: normal ST segments - no evidence of significant ST depression With Exercise: no evidence of significant ST depression  Other Information Arrhythmia:  No Angina during ETT:  absent (0) Quality of ETT:  diagnostic  ETT Interpretation:  normal - no evidence of ischemia by ST analysis  Comments: Poor exercise tolerance. No chest pain. Hypertensive BP response to exercise.  Baseline BP elevated (patient did not take medications today). No ST-T changes to suggest ischemia.   Recommendations: Follow up with Letitia Libra, Ala Dach, MD as directed. Patient advised to take BP medications as soon as possible today.  Luna Glasgow, PA-C  4:07 PM 10/18/2012

## 2012-10-27 NOTE — Progress Notes (Signed)
Thought his f/u appt was before now. pls notify stress test looks ok

## 2012-11-03 ENCOUNTER — Ambulatory Visit: Payer: Medicare Other | Admitting: Internal Medicine

## 2012-11-16 ENCOUNTER — Ambulatory Visit (INDEPENDENT_AMBULATORY_CARE_PROVIDER_SITE_OTHER): Payer: Medicare Other | Admitting: Internal Medicine

## 2012-11-16 ENCOUNTER — Encounter: Payer: Self-pay | Admitting: Internal Medicine

## 2012-11-16 VITALS — BP 156/84 | HR 78 | Temp 98.6°F | Resp 14 | Wt 168.8 lb

## 2012-11-16 DIAGNOSIS — M899 Disorder of bone, unspecified: Secondary | ICD-10-CM

## 2012-11-16 DIAGNOSIS — R7989 Other specified abnormal findings of blood chemistry: Secondary | ICD-10-CM

## 2012-11-16 DIAGNOSIS — I10 Essential (primary) hypertension: Secondary | ICD-10-CM

## 2012-11-16 DIAGNOSIS — M949 Disorder of cartilage, unspecified: Secondary | ICD-10-CM

## 2012-11-16 MED ORDER — AMLODIPINE BESYLATE 10 MG PO TABS: ORAL_TABLET | ORAL | Status: DC

## 2012-11-16 NOTE — Patient Instructions (Signed)
Please increase your norvasc (amlodipine) to 10mg . You can take two 5mg  pills until they run out then fill the new prescription we gave you.

## 2012-11-18 NOTE — Assessment & Plan Note (Signed)
Hematology consult  ?

## 2012-11-18 NOTE — Progress Notes (Signed)
  Subjective:    Patient ID: Ronald Wolf, male    DOB: Mar 25, 1946, 66 y.o.   MRN: [id number]  HPI Pt presents to clinic for followup of multiple medical problems. S/p neurology consult for trigeminal neuralgia with med dose increase. No improvement of sx's. BP remains elevated after adding norvasc-tolerating without side effects. H/o bladder ca following with urology with regular surveillance. Reviewed cranial mri with indeterminate bony appearance of c2. Also reviewed elevated ferritin.  Past Medical History  Diagnosis Date  . Depression   . Eating disorder   . Hepatitis C   . Hypertension    Past Surgical History  Procedure Date  . Tumor removal 1990    bladder    reports that he has been smoking.  He has never used smokeless tobacco. He reports that he drinks alcohol. He reports that he does not use illicit drugs. family history includes Cancer in his brother; Diabetes in his mother; and Healthy in his sister.  There is no history of Prostate cancer, and Colon cancer, and Breast cancer, and Heart disease, and Hypertension, . Allergies  Allergen Reactions  . Iohexol      Desc: pt broke out in hives years ago from iv contrast. he was given 50mg  benadryl po, 1 hr prior to ct and did fine today w/o complications.  JB       Review of Systems see hpi     Objective:   Physical Exam  Nursing note and vitals reviewed. Constitutional: He appears well-developed and well-nourished. No distress.  Skin: He is not diaphoretic.          Assessment & Plan:

## 2012-11-18 NOTE — Assessment & Plan Note (Signed)
Increase norvasc 10mg  qd

## 2012-11-23 ENCOUNTER — Telehealth: Payer: Self-pay | Admitting: Hematology & Oncology

## 2012-11-23 NOTE — Telephone Encounter (Signed)
Per referral pt wanted appointment before 12-21. When I talked to Pt he said 12-23 would be ok. Pt aware of 12-23 appointment

## 2012-12-04 ENCOUNTER — Other Ambulatory Visit (HOSPITAL_BASED_OUTPATIENT_CLINIC_OR_DEPARTMENT_OTHER): Payer: Medicare Other | Admitting: Lab

## 2012-12-04 ENCOUNTER — Ambulatory Visit (HOSPITAL_BASED_OUTPATIENT_CLINIC_OR_DEPARTMENT_OTHER): Payer: Medicare Other | Admitting: Medical

## 2012-12-04 ENCOUNTER — Ambulatory Visit: Payer: Medicare Other

## 2012-12-04 VITALS — BP 172/83 | HR 86 | Temp 98.8°F | Resp 18 | Ht 71.0 in | Wt 167.0 lb

## 2012-12-04 DIAGNOSIS — R7989 Other specified abnormal findings of blood chemistry: Secondary | ICD-10-CM

## 2012-12-04 DIAGNOSIS — M899 Disorder of bone, unspecified: Secondary | ICD-10-CM

## 2012-12-04 DIAGNOSIS — B1920 Unspecified viral hepatitis C without hepatic coma: Secondary | ICD-10-CM

## 2012-12-04 DIAGNOSIS — Z8551 Personal history of malignant neoplasm of bladder: Secondary | ICD-10-CM

## 2012-12-04 LAB — CBC WITH DIFFERENTIAL (CANCER CENTER ONLY)
BASO#: 0.1 10*3/uL (ref 0.0–0.2)
BASO%: 0.6 % (ref 0.0–2.0)
EOS%: 1.3 % (ref 0.0–7.0)
Eosinophils Absolute: 0.1 10*3/uL (ref 0.0–0.5)
HCT: 39.4 % (ref 38.7–49.9)
HGB: 13.2 g/dL (ref 13.0–17.1)
LYMPH#: 2.7 10*3/uL (ref 0.9–3.3)
LYMPH%: 35.2 % (ref 14.0–48.0)
MCH: 35.9 pg — ABNORMAL HIGH (ref 28.0–33.4)
MCHC: 33.5 g/dL (ref 32.0–35.9)
MCV: 107 fL — ABNORMAL HIGH (ref 82–98)
MONO#: 0.9 10*3/uL (ref 0.1–0.9)
MONO%: 12.1 % (ref 0.0–13.0)
NEUT#: 3.9 10*3/uL (ref 1.5–6.5)
NEUT%: 50.8 % (ref 40.0–80.0)
Platelets: 249 10*3/uL (ref 145–400)
RBC: 3.68 10*6/uL — ABNORMAL LOW (ref 4.20–5.70)
RDW: 11.4 % (ref 11.1–15.7)
WBC: 7.7 10*3/uL (ref 4.0–10.0)

## 2012-12-04 LAB — CHCC SATELLITE - SMEAR

## 2012-12-04 NOTE — H&P (Signed)
[facility] NEW PATIENT EVALUATION   Name: Ronald Wolf Date: 12/04/2012 MRN: [id number] DOB: October 10, 1946   REFERRING PHYSICIAN: Edwyna Perfect, MD  REASON FOR REFERRAL: Increased level of ferritin and follow up on Brain MR from 8/13    HISTORY OF PRESENT ILLNESS:Ronald Wolf is a 66 y.o. male who was kindly referred to [place] for further evaluation of his increased ferritin level.  This is a pleasant, gentleman, who, reports, that he was told his ferritin level was high and needed to followup with hematologist.  He does have a significant past medical history for depression, hepatitis C, hypertension, trigeminal neuralgia, any history of bladder cancer.  According to Ronald Wolf.  He follows up at [facility] urology for the history of bladder cancer.  He recently had some lab work done in his ferritin level was noted to be 1,649.  This was back on October 13.  Back on June 03, 2011 his ferritin level was 1349.  His iron studies were all within normal limits.  The last iron panel we have on him from June, 2012 revealed an iron of 147, with 40% saturation.  Again, he does have a history of hepatitis C.  His AFP tumor marker was 16.5.  On 06/03/2011.  He had a negative ANA.  His TSH is within normal limits.  His PSA is 0.02.  According to Ronald Wolf.  He's not been seen by a gastroenterologist.  I'm not sure why this is.  He, reports, that he's had it for about 6 years.  He states, that after he had the, tumor, removed from his bladder in 1990 he received a blood transfusion.  He states he found that he had hepatitis C in 2006.  It is a possibility his elevated ferritin could be likely due to his hepatitis C.  I think the main concern right now, is to get him referred to a gastroenterologist.  We will go ahead with an ultrasound of his abdomen to rule out a hepatoma.  In terms of the history of bladder cancer.  He did have an MR of the brain, which did reveal an area of altered signal intensity C2  vertebral body/base of the dense.  Metastatic disease cannot be ruled out.  He does have followup with regular surveillance at [facility] urology.  One would think that if this in fact, was metastatic disease, it would've progressed by now.  We will go ahead and repeat an MR of the brain with and without contrast.  He does report a decreased appetite.  He is having unintentional weight loss.  He, reports she's lost about 20 pounds less than one year.  He does complain of some fatigue.  He does have some dyspnea on exertion.  He does report a cough that has been chronic.  He does not report any unusual bone pain, or arthralgias or myalgias.  He does not report any specific joint pain.  He denies any fevers, chills, or night sweats.  He denies any palpable adenopathy.  He denies any headaches, visual changes, or rashes.  He denies any obvious, or abnormal bleeding.  He denies any abdominal pain.  We did go ahead and send off hemochromatosis assay. I do think his biggest issue right now is the hepatitis, C.  He will require a thorough workup with this.  We will get an ultrasound of the liver.  He, otherwise, is able to perform his activities of daily living independently without any hindrance or decline  PAST MEDICAL HISTORY:  has a past medical history of Depression; Eating disorder; Hepatitis C; Hypertension, trigeminal neuralgia , and bladder cancer  PAST SURGICAL HISTORY: Past Surgical History  Procedure Date  . Tumor removal 1990    bladder   Current Outpatient Prescriptions on File Prior to Visit  Medication Sig Dispense Refill  . amLODipine (NORVASC) 10 MG tablet One by mouth daily for blood pressure  30 tablet  6  . fish oil-omega-3 fatty acids 1000 MG capsule Take 2 g by mouth daily.      . fluticasone (FLONASE) 50 MCG/ACT nasal spray 2 sprays each nostril once a day for nasal drainage  16 g  6  . losartan (COZAAR) 100 MG tablet Take 100 mg by mouth daily. Pt will pick up at pharmacy 12-04-12  ,  has been out for 3 days.       ALLERGIES: Iohexol  SOCIAL HISTORY: He resides in [place].  He lives alone.  He is retired, but still continues to work.  He  reports that he has been smoking.   He's been smoking for the past 40 years about a pack a day. He has never used smokeless tobacco. He reports that he drinks alcohol on a daily basis . He reports that he does not use illicit drugs.  FAMILY HISTORY: family history includes Cancer in his brother; Diabetes in his mother; and Healthy in his sister.  There is no history of Prostate cancer, and Colon cancer, and Breast cancer, and Heart disease, and Hypertension, .  LABORATORY DATA:  White count 7.7, hemoglobin 13.2, hematocrit 39.4, MCV 107, platelets 249,000   RADIOGRAPHY: MR of the brain with and without contrast on 07/25/2012: Impression: #1 motion degraded exam.  No acute infarct is noted.  No intracranial hemorrhage.  Scattered, mild, small vessel disease, type changes.  Global atrophy.  An area of altered signal intensity C2 vertebral body/base of the dense.  Etiology indeterminate.  Metastatic disease, not entirely excluded.  No other findings to suggest metastatic disease.   Review of Systems: Constitutional:Negative for malaise/fatigue, fever, chills, weight loss, diaphoresis, activity change, appetite change, and unexpected weight change.  HEENT: Negative for double vision, blurred vision, visual loss, ear pain, tinnitus, congestion, rhinorrhea, epistaxis sore throat or sinus disease, oral pain/lesion, tongue soreness Respiratory: Negative for cough, chest tightness, shortness of breath, wheezing and stridor.  Cardiovascular: Negative for chest pain, palpitations, leg swelling, orthopnea, PND, DOE or claudication Gastrointestinal: Negative for nausea, vomiting, abdominal pain, diarrhea, constipation, blood in stool, melena, hematochezia, abdominal distention, anal bleeding, rectal pain, anorexia and hematemesis.  Genitourinary:  Negative for dysuria, frequency, hematuria,  Musculoskeletal: Negative for myalgias, back pain, joint swelling, arthralgias and gait problem.  Skin: Negative for rash, color change, pallor and wound.  Neurological:. Negative for dizziness/light-headedness, tremors, seizures, syncope, facial asymmetry, speech difficulty, weakness, numbness, headaches and paresthesias.  Hematological: Negative for adenopathy. Does not bruise/bleed easily.  Psychiatric/Behavioral:  Negative for depression, no loss of interest in normal activity or change in sleep pattern.   Physical Exam: This is a pleasant, 65 year old, well-developed, well-nourished, African American, gentleman, in no obvious distress Vitals: Temperature 90.8 degrees, pulse 86, respirations 18, blood pressure 172/83, weight 167 pounds HEENT reveals a normocephalic, atraumatic skull, no scleral icterus, no oral lesions  Neck is supple without any cervical or supraclavicular adenopathy.  Lungs are clear to auscultation bilaterally. There are no wheezes, rales or rhonci Cardiac is regular rate and rhythm with a normal S1 and S2. There are no  murmurs, rubs, or bruits.  Abdomen is soft with good bowel sounds, there is no palpable mass. There is no palpable hepatosplenomegaly. There is no palpable fluid wave.  Musculoskeletal no tenderness of the spine, ribs, or hips.  Extremities there are no clubbing, cyanosis, or edema.  Skin no petechia, purpura or ecchymosis Neurologic is nonfocal.  Assessment/Plan: This is a pleasant, 66 year old, African American, gentleman, with the following issues:  #1.  Elevated ferritin level.  This is most likely secondary to his hepatitis C..  We did send off a hemachromatosis assay.   #2.  Hepatitis C. ,  I think right now, this is his biggest problem. We will go ahead and make a referral to gastroenterology.  We will go ahead and get an ultrasound of his abdomen.  This could potentially be the cause of his elevated  ferritin.  I did advise against his daily alcohol use, however, not sure how compliant he will be.  #3  History of bladder cancer.  He continues to followup at [facility] urology.  #4  Cranial MRI with indeterminate bony appearance of C2 vertebral body/base of the dens from August 2013.  We will go ahead and repeat a MR of the brain to rule out metastatic disease.  #5.  Followup.  We will follow back up with Mr. Vogan in 2 months, but before then should there be questions or concerns.   The above assessment and plan was collaborated and agreed upon with Dr. Myna Hidalgo.  Addendum:  It does appear.  Back on 06/03/2011.  Mr. Gerdeman was seen by Dr. Lavada Mesi for his genotype unknown hepatitis C virus.  Apparently at that time.  He was drinking a pint of alcohol twice a week.  He had also had 3 DWIs with the last being over 11 years ago.  There is no history of intravenous or intranasal drug use, tattoos, or unsterile body piercing.  The significance of genotyping was discussed with the patient.  It was discussed the role of biopsy.  For genotype 1.  Treatment with pegylated interferon and ribavirin for all genotypes, and a protease inhibitor in addition, for genotype 1 patients, was discussed.  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  They did book him for a biopsy.  However, I am unable to find, that pathology report.  I do not see, where he ever made it back in for followup.

## 2012-12-04 NOTE — Progress Notes (Signed)
This office note has been dictated.

## 2012-12-05 ENCOUNTER — Ambulatory Visit (HOSPITAL_BASED_OUTPATIENT_CLINIC_OR_DEPARTMENT_OTHER)
Admission: RE | Admit: 2012-12-05 | Discharge: 2012-12-05 | Disposition: A | Discharge status: Home / Self Care | Payer: Medicare Other | Source: Ambulatory Visit | Attending: Medical | Admitting: Medical

## 2012-12-05 ENCOUNTER — Ambulatory Visit (HOSPITAL_BASED_OUTPATIENT_CLINIC_OR_DEPARTMENT_OTHER): Admission: RE | Admit: 2012-12-05 | Payer: Medicare Other | Source: Ambulatory Visit

## 2012-12-05 DIAGNOSIS — B1920 Unspecified viral hepatitis C without hepatic coma: Secondary | ICD-10-CM | POA: Insufficient documentation

## 2012-12-05 DIAGNOSIS — K8020 Calculus of gallbladder without cholecystitis without obstruction: Secondary | ICD-10-CM | POA: Insufficient documentation

## 2012-12-05 LAB — IRON AND TIBC
%SAT: 82 % — ABNORMAL HIGH (ref 20–55)
Iron: 273 ug/dL — ABNORMAL HIGH (ref 42–165)
TIBC: 334 ug/dL (ref 215–435)
UIBC: 61 ug/dL — ABNORMAL LOW (ref 125–400)

## 2012-12-05 LAB — COMPREHENSIVE METABOLIC PANEL
ALT: 76 U/L — ABNORMAL HIGH (ref 0–53)
AST: 78 U/L — ABNORMAL HIGH (ref 0–37)
Albumin: 4 g/dL (ref 3.5–5.2)
Alkaline Phosphatase: 94 U/L (ref 39–117)
BUN: 21 mg/dL (ref 6–23)
CO2: 25 mEq/L (ref 19–32)
Calcium: 9.7 mg/dL (ref 8.4–10.5)
Chloride: 105 mEq/L (ref 96–112)
Creatinine, Ser: 0.93 mg/dL (ref 0.50–1.35)
Glucose, Bld: 90 mg/dL (ref 70–99)
Potassium: 4.7 mEq/L (ref 3.5–5.3)
Sodium: 137 mEq/L (ref 135–145)
Total Bilirubin: 0.8 mg/dL (ref 0.3–1.2)
Total Protein: 7.7 g/dL (ref 6.0–8.3)

## 2012-12-05 LAB — FERRITIN: Ferritin: 1944 ng/mL — ABNORMAL HIGH (ref 22–322)

## 2012-12-08 ENCOUNTER — Telehealth: Payer: Self-pay | Admitting: Hematology & Oncology

## 2012-12-08 NOTE — Telephone Encounter (Signed)
Was checking to see if [facility] has gotten referral for pt. Tried to call no answer, checked referral saw note to [facility]. Sent note back to Amy with CC to [place] that we needed them to see Pt that [facility] would not see him.

## 2012-12-08 NOTE — Telephone Encounter (Signed)
Called to check on referral, she said they do not see Hep C patients and we need to call [phone] which is a clinic in [facility] but she did not know the name of it. [place] PA aware.

## 2012-12-08 NOTE — Telephone Encounter (Signed)
Per [place] PA cx referral to GI MD

## 2012-12-09 ENCOUNTER — Encounter (HOSPITAL_BASED_OUTPATIENT_CLINIC_OR_DEPARTMENT_OTHER): Payer: Self-pay

## 2012-12-09 ENCOUNTER — Ambulatory Visit (HOSPITAL_BASED_OUTPATIENT_CLINIC_OR_DEPARTMENT_OTHER)
Admission: RE | Admit: 2012-12-09 | Discharge: 2012-12-09 | Disposition: A | Discharge status: Home / Self Care | Payer: Medicare Other | Source: Ambulatory Visit | Attending: Medical | Admitting: Medical

## 2012-12-09 DIAGNOSIS — G319 Degenerative disease of nervous system, unspecified: Secondary | ICD-10-CM | POA: Insufficient documentation

## 2012-12-09 DIAGNOSIS — R7989 Other specified abnormal findings of blood chemistry: Secondary | ICD-10-CM

## 2012-12-09 MED ORDER — GADOBENATE DIMEGLUMINE 529 MG/ML IV SOLN
15.0000 mL | Freq: Once | INTRAVENOUS | Status: AC | PRN
  Administered 2012-12-09: 15 mL via INTRAVENOUS

## 2012-12-28 ENCOUNTER — Telehealth: Payer: Self-pay | Admitting: Internal Medicine

## 2012-12-28 ENCOUNTER — Telehealth: Payer: Self-pay | Admitting: *Deleted

## 2012-12-28 ENCOUNTER — Other Ambulatory Visit: Payer: Self-pay | Admitting: Medical

## 2012-12-28 NOTE — Telephone Encounter (Signed)
Patient informed that Oncology would have been forwarded the results of the tests in question, as they were the office/provider that ordered the test/imaging when seen in office 12.26.13; gave pt contact name [in EMR] & office phone number/SLS

## 2012-12-28 NOTE — Telephone Encounter (Signed)
Patient states that he never has received the results from the [place] and MRI that he did a month ago.

## 2012-12-28 NOTE — Progress Notes (Signed)
Attempted to call pt. Several times to no avail.  Pt. Finally called this office today to get results of MRI of brain and abdominal U/S.  Provided results.  Pt. Had ref. Made to GI for f/u on Hep C.  Apparently pt. Has been non-compliant in the past with follow-ups.

## 2012-12-28 NOTE — Telephone Encounter (Signed)
Pt left a message asking for the results of his MRI and [place]. Gave message to Eunice Blase, PA as she saw the pt. He can be reached at [phone].

## 2013-01-16 ENCOUNTER — Telehealth: Payer: Self-pay

## 2013-01-16 NOTE — Telephone Encounter (Signed)
Patient informed and states he is still having pain in his jaw. I informed patient that he would need to come in for an appt since we have never seen him for any of these issues. Pt said "oh my god" and hung up.  I discussed this with Dr Abner Greenspan and she voiced that she agreed that pt needs an appt.

## 2013-01-16 NOTE — Telephone Encounter (Signed)
Patient called stating he had labs and an ultrasound done in Dec 2013 and has never got the results back. Pt stated he is really worried about these tests and would like the results ASAP.  I apologized to the patient and informed him that the MD and myself were new at this office and I would find out the answers for him and get back with him this afternoon. Patient voiced understanding.  Please advise?

## 2013-01-16 NOTE — Telephone Encounter (Signed)
So I have reviewed his chart. Dr Rodena Medin did not order the tests so that is why he did not manage them. MR showed no acute process or mass in brain, spot in neck is unchanged so no acute worry. Labs unremarkable just mildly elevated LFTs c/w Hep C. Ultrasound showed 2 small lesions that are likely benign but they recommend a follow up image in about 3 months which would be late February or early March, he can either com ein to see me to get that ordered or go back to hematology for further evaluation since they ordered the test

## 2013-01-16 NOTE — Telephone Encounter (Signed)
FYI: Pt left another message stating he wanted Dr "Lonna Duval" to return his call. I called pt and informed him again I could take a message and pass it on to Dr Abner Greenspan. I also informed him that I discussed this with Dr Abner Greenspan and she stated she would not call and he would need an appt to be referred out. Pt then proceeded to say he wanted to know his lab results. I informed him that I would tell him again but we have told him twice already. Pt doesn't think he needs to make an appt and then hung up.

## 2013-02-05 ENCOUNTER — Ambulatory Visit: Payer: Medicare Other | Admitting: Hematology & Oncology

## 2013-02-05 ENCOUNTER — Other Ambulatory Visit: Payer: Medicare Other | Admitting: Lab

## 2013-02-05 ENCOUNTER — Telehealth: Payer: Self-pay | Admitting: Hematology & Oncology

## 2013-02-05 NOTE — Telephone Encounter (Signed)
Pt cx 2-24 said would call back to reschedule

## 2013-03-29 ENCOUNTER — Telehealth: Payer: Self-pay | Admitting: Family Medicine

## 2013-03-29 ENCOUNTER — Encounter: Payer: Self-pay | Admitting: Family Medicine

## 2013-03-29 ENCOUNTER — Ambulatory Visit (INDEPENDENT_AMBULATORY_CARE_PROVIDER_SITE_OTHER): Payer: Medicare Other | Admitting: Family Medicine

## 2013-03-29 VITALS — BP 126/76 | HR 75 | Temp 98.6°F | Resp 18 | Wt 165.0 lb

## 2013-03-29 DIAGNOSIS — G500 Trigeminal neuralgia: Secondary | ICD-10-CM

## 2013-03-29 DIAGNOSIS — R079 Chest pain, unspecified: Secondary | ICD-10-CM

## 2013-03-29 DIAGNOSIS — Z5189 Encounter for other specified aftercare: Secondary | ICD-10-CM

## 2013-03-29 DIAGNOSIS — S0432XD Injury of trigeminal nerve, left side, subsequent encounter: Secondary | ICD-10-CM

## 2013-03-29 DIAGNOSIS — K769 Liver disease, unspecified: Secondary | ICD-10-CM

## 2013-03-29 DIAGNOSIS — I10 Essential (primary) hypertension: Secondary | ICD-10-CM

## 2013-03-29 DIAGNOSIS — K7689 Other specified diseases of liver: Secondary | ICD-10-CM

## 2013-03-29 DIAGNOSIS — M549 Dorsalgia, unspecified: Secondary | ICD-10-CM

## 2013-03-29 MED ORDER — CARBAMAZEPINE 100 MG PO CHEW: 200.0000 mg | CHEWABLE_TABLET | Freq: Two times a day (BID) | ORAL | Status: DC

## 2013-03-29 NOTE — Telephone Encounter (Signed)
Patient is scheduled for CT abd w/wo    He has allergy to IV contrast and needs pre medication   , see instruction from [place] [facility]  ,patient also needs order for labs

## 2013-04-01 ENCOUNTER — Encounter: Payer: Self-pay | Admitting: Family Medicine

## 2013-04-01 ENCOUNTER — Other Ambulatory Visit: Payer: Self-pay | Admitting: Family Medicine

## 2013-04-01 DIAGNOSIS — K769 Liver disease, unspecified: Secondary | ICD-10-CM

## 2013-04-01 DIAGNOSIS — Z79899 Other long term (current) drug therapy: Secondary | ICD-10-CM

## 2013-04-01 HISTORY — DX: Liver disease, unspecified: K76.9

## 2013-04-01 NOTE — Telephone Encounter (Signed)
So please call Ronald Wolf and see how they need me to change is order. His chart reports he is allergic to IV contrast, but when he received Benadryl first he did fine, how do they need me to change the order. I have placed an order for him to come do a renal panel.

## 2013-04-01 NOTE — Assessment & Plan Note (Signed)
Well controlled, no changes to meds 

## 2013-04-01 NOTE — Progress Notes (Signed)
Patient ID: Ronald Wolf, male   DOB: May 02, 1946, 67 y.o.   MRN: [id number] LLIAM HOH [id number] 11-05-1946 04/01/2013      Progress Note-Follow Up  Subjective  Chief Complaint  Chief Complaint  Patient presents with  . Hypertension    Pt states BP is elevated every evening.  . Weight Loss    Pt reports 15 pound weight loss since his first visit here.  Marland Kitchen Results    Pt wants to discuss imaging results. Recent labs indicated elevated LFTs.  . Jaw Pain    Pt continues to have jaw pain    HPI  Patient is a 67 year old AA male in today for follow up with numerous complaints. He notes weight loss and persistent pain secondary to his left sided trigeminal neuralgia, did see neurologya nd was given an rx for Carbamazepine and found that helpful but is out of the med at this time. He is concerned about worsening SOB with exertion. And worsening fatigue. Notes intermittent chest pain, anterior chest wall and worse at night. Has episodes of palpitations worse at night for the past few weeks. Does not note cp with palp. No nausea or diaphoresis associated with pain, no pain in office. No other GI or Gu c/o. Has persistent, stable low back pain.  Past Medical History  Diagnosis Date  . Depression   . Eating disorder   . Hepatitis C   . Hypertension   . Lesion of liver 04/01/2013    Past Surgical History  Procedure Laterality Date  . 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 Prostate cancer Neg Hx   . Colon cancer Neg Hx   . Breast cancer Neg Hx   . Heart disease Neg Hx   . Diabetes Mother   . Hypertension Neg Hx   . Healthy Sister     x 2  . Cancer Brother     stomach    History   Social History  . Marital Status: Single    Spouse Name: N/A    Number of Children: N/A  . Years of Education: N/A   Occupational History  . Not on file.   Social History Main Topics  . Smoking status: Current Every Day Smoker  . Smokeless  tobacco: Never Used  . Alcohol Use: Yes  . Drug Use: No  . Sexually Active: Not on file   Other Topics Concern  . Not on file   Social History Narrative  . No narrative on file    Current Outpatient Prescriptions on File Prior to Visit  Medication Sig Dispense Refill  . amLODipine (NORVASC) 10 MG tablet One by mouth daily for blood pressure  30 tablet  6  . fish oil-omega-3 fatty acids 1000 MG capsule Take 2 g by mouth daily.      . fluticasone (FLONASE) 50 MCG/ACT nasal spray 2 sprays each nostril once a day for nasal drainage  16 g  6  . losartan (COZAAR) 100 MG tablet Take 100 mg by mouth daily. Pt will pick up at pharmacy 12-04-12  , has been out for 3 days.       No current facility-administered medications on file prior to visit.    Allergies  Allergen Reactions  . Iohexol      Desc: pt broke out in hives years ago from iv contrast. he was given 50mg  benadryl po, 1 hr prior to  ct and did fine today w/o complications.  JB     Review of Systems  Review of Systems  Constitutional: Positive for weight loss and malaise/fatigue. Negative for fever.  HENT: Negative for congestion.   Eyes: Negative for discharge.  Respiratory: Positive for shortness of breath. Negative for cough and sputum production.   Cardiovascular: Positive for chest pain. Negative for palpitations and leg swelling.  Gastrointestinal: Negative for heartburn, nausea, abdominal pain and diarrhea.  Genitourinary: Negative for dysuria.  Musculoskeletal: Negative for falls.  Skin: Negative for rash.  Neurological: Negative for loss of consciousness and headaches.  Endo/Heme/Allergies: Negative for polydipsia.  Psychiatric/Behavioral: Negative for depression and suicidal ideas. The patient is not nervous/anxious and does not have insomnia.     Objective  BP 126/76  Pulse 75  Temp(Src) 98.6 F (37 C) (Oral)  Resp 18  Wt 165 lb (74.844 kg)  BMI 23.02 kg/m2  SpO2 98%  Physical Exam  Physical Exam   Constitutional: He is oriented to person, place, and time and well-developed, well-nourished, and in no distress. No distress.  HENT:  Head: Normocephalic and atraumatic.  Eyes: Conjunctivae are normal.  Neck: Neck supple. No thyromegaly present.  Cardiovascular: Normal rate, regular rhythm and normal heart sounds.   Pulmonary/Chest: Effort normal and breath sounds normal. No respiratory distress.  Abdominal: He exhibits no distension and no mass. There is no tenderness.  Musculoskeletal: He exhibits no edema.  Neurological: He is alert and oriented to person, place, and time.  Skin: Skin is warm.  Psychiatric: Memory, affect and judgment normal.    Lab Results  Component Value Date   TSH 2.329 06/12/2012   Lab Results  Component Value Date   WBC 7.7 12/04/2012   HGB 13.2 12/04/2012   HCT 39.4 12/04/2012   MCV 107* 12/04/2012   PLT 249 12/04/2012   Lab Results  Component Value Date   CREATININE 0.93 12/04/2012   BUN 21 12/04/2012   NA 137 12/04/2012   K 4.7 12/04/2012   CL 105 12/04/2012   CO2 25 12/04/2012   Lab Results  Component Value Date   ALT 76* 12/04/2012   AST 78* 12/04/2012   ALKPHOS 94 12/04/2012   BILITOT 0.8 12/04/2012    Assessment & Plan  HTN (hypertension) Well controlled, no changes to meds  Lesion of liver CT in fall 2013 showed some lesions in the liver, repeat imaging ordered today to assess for stability.  Trigeminal neuralgia Good response to Carbamazepine bid and is given a refill today.   Chest pain Patient has been struggling with intermittent atypical chest pain for several months and is complaining of worsening sob as well. Is referred to cardiology for further consideration at this time, he will seek further care if symptoms return and do not resolve before his cardiology work up has been completed.

## 2013-04-01 NOTE — Assessment & Plan Note (Signed)
Good response to Carbamazepine bid and is given a refill today.

## 2013-04-01 NOTE — Assessment & Plan Note (Signed)
CT in fall 2013 showed some lesions in the liver, repeat imaging ordered today to assess for stability.

## 2013-04-01 NOTE — Assessment & Plan Note (Signed)
Patient has been struggling with intermittent atypical chest pain for several months and is complaining of worsening sob as well. Is referred to cardiology for further consideration at this time, he will seek further care if symptoms return and do not resolve before his cardiology work up has been completed.

## 2013-04-02 ENCOUNTER — Other Ambulatory Visit: Payer: Self-pay | Admitting: Family Medicine

## 2013-04-02 ENCOUNTER — Ambulatory Visit (INDEPENDENT_AMBULATORY_CARE_PROVIDER_SITE_OTHER): Payer: Medicare Other | Admitting: Family Medicine

## 2013-04-02 DIAGNOSIS — Z5189 Encounter for other specified aftercare: Secondary | ICD-10-CM

## 2013-04-02 DIAGNOSIS — Z91041 Radiographic dye allergy status: Secondary | ICD-10-CM

## 2013-04-02 MED ORDER — DIPHENHYDRAMINE HCL 25 MG PO TABS: 50.0000 mg | ORAL_TABLET | Freq: Once | ORAL | Status: DC

## 2013-04-02 MED ORDER — PREDNISONE 50 MG PO TABS: ORAL_TABLET | ORAL | Status: DC

## 2013-04-02 NOTE — Telephone Encounter (Signed)
I have ordered the prednisone please let him to pick it up and how to take it prior to the CT scan

## 2013-04-02 NOTE — Telephone Encounter (Signed)
I spoke to Ronald Wolf in radiology and she states that pt has to have Prednisone 50 mg 13 hours before procedure,7 hours before and 1 hour before along with 50 mg of Benadryl 1 hour before.

## 2013-04-02 NOTE — Telephone Encounter (Signed)
I have placed order in computer for Benadryl 50 mg 1 hour prior to C T scan, po, not sure if radiology needs to hear that verbally, please call and let them and patient know what to do

## 2013-04-02 NOTE — Progress Notes (Signed)
Patient ID: Ronald Wolf, male   DOB: 10-21-1946, 67 y.o.   MRN: [id number] Needs 50 mg Benadryl po 1 hour prior to CT scan. Order placed

## 2013-04-03 NOTE — Telephone Encounter (Signed)
Left a message for patient to return my call. 

## 2013-04-04 ENCOUNTER — Telehealth: Payer: Self-pay

## 2013-04-04 DIAGNOSIS — S0432XD Injury of trigeminal nerve, left side, subsequent encounter: Secondary | ICD-10-CM

## 2013-04-04 NOTE — Telephone Encounter (Signed)
Rose with CT called stating she can't get ahold of pt. She is going to leave pt on her schedule but will most likely have to reschedule because no labs have been done.  I tried to call pt again and left a message to return my call.  I'm closing note due to pt supposed to be going tomorrow

## 2013-04-04 NOTE — Telephone Encounter (Signed)
Please go ahead and send his carbamezapine again, I sent it at his visit but I am not sure it got there. Sen a months supply and 3 refills

## 2013-04-04 NOTE — Telephone Encounter (Signed)
Patient left a message stating that his jaw really hurts and he went to the pharmacy and there was no pain medication there?   I tried to call patient back and got his vm. Left a message to return my call

## 2013-04-05 ENCOUNTER — Other Ambulatory Visit: Payer: Medicare Other

## 2013-04-05 DIAGNOSIS — Z79899 Other long term (current) drug therapy: Secondary | ICD-10-CM

## 2013-04-05 LAB — RENAL FUNCTION PANEL
Albumin: 3.7 g/dL (ref 3.5–5.2)
BUN: 14 mg/dL (ref 6–23)
CO2: 24 mEq/L (ref 19–32)
Calcium: 9.2 mg/dL (ref 8.4–10.5)
Chloride: 107 mEq/L (ref 96–112)
Creat: 0.9 mg/dL (ref 0.50–1.35)
Glucose, Bld: 128 mg/dL — ABNORMAL HIGH (ref 70–99)
Phosphorus: 3.5 mg/dL (ref 2.3–4.6)
Potassium: 3.9 mEq/L (ref 3.5–5.3)
Sodium: 139 mEq/L (ref 135–145)

## 2013-04-05 MED ORDER — CARBAMAZEPINE 100 MG PO CHEW: 200.0000 mg | CHEWABLE_TABLET | Freq: Two times a day (BID) | ORAL | Status: DC

## 2013-04-05 NOTE — Telephone Encounter (Signed)
Resent rx

## 2013-04-06 ENCOUNTER — Telehealth: Payer: Self-pay

## 2013-04-06 ENCOUNTER — Ambulatory Visit (INDEPENDENT_AMBULATORY_CARE_PROVIDER_SITE_OTHER)
Admission: RE | Admit: 2013-04-06 | Discharge: 2013-04-06 | Disposition: A | Discharge status: Home / Self Care | Payer: Medicare Other | Source: Ambulatory Visit | Attending: Family Medicine | Admitting: Family Medicine

## 2013-04-06 DIAGNOSIS — K769 Liver disease, unspecified: Secondary | ICD-10-CM

## 2013-04-06 DIAGNOSIS — K7689 Other specified diseases of liver: Secondary | ICD-10-CM

## 2013-04-06 MED ORDER — IOHEXOL 350 MG/ML SOLN
100.0000 mL | Freq: Once | INTRAVENOUS | Status: AC | PRN
  Administered 2013-04-06: 100 mL via INTRAVENOUS

## 2013-04-10 ENCOUNTER — Telehealth: Payer: Self-pay | Admitting: Family Medicine

## 2013-04-10 NOTE — Progress Notes (Signed)
Quick Note:  Patient Informed and voiced understanding ______ 

## 2013-04-10 NOTE — Telephone Encounter (Signed)
Please call patient with CT results    [phone]

## 2013-04-10 NOTE — Progress Notes (Signed)
Patient informed and voiced understanding

## 2013-04-10 NOTE — Telephone Encounter (Signed)
Patient informed. 

## 2013-04-13 NOTE — Telephone Encounter (Signed)
Opened in error

## 2013-04-23 ENCOUNTER — Telehealth: Payer: Self-pay

## 2013-04-23 NOTE — Telephone Encounter (Signed)
Pt called stating he would like his MRI results. I informed pt that MD and I both looked at the chart and we did not order any MRI.

## 2013-04-24 ENCOUNTER — Ambulatory Visit: Payer: Medicare Other | Admitting: Cardiovascular Disease

## 2013-05-03 ENCOUNTER — Ambulatory Visit (INDEPENDENT_AMBULATORY_CARE_PROVIDER_SITE_OTHER): Payer: Medicare Other | Admitting: Cardiovascular Disease

## 2013-05-03 ENCOUNTER — Encounter: Payer: Self-pay | Admitting: Cardiovascular Disease

## 2013-05-03 VITALS — BP 134/80 | HR 81 | Ht 71.0 in | Wt 169.0 lb

## 2013-05-03 DIAGNOSIS — R079 Chest pain, unspecified: Secondary | ICD-10-CM

## 2013-05-03 DIAGNOSIS — IMO0001 Reserved for inherently not codable concepts without codable children: Secondary | ICD-10-CM | POA: Insufficient documentation

## 2013-05-03 DIAGNOSIS — I10 Essential (primary) hypertension: Secondary | ICD-10-CM

## 2013-05-03 DIAGNOSIS — F17200 Nicotine dependence, unspecified, uncomplicated: Secondary | ICD-10-CM | POA: Insufficient documentation

## 2013-05-03 MED ORDER — VARENICLINE TARTRATE 0.5 MG X 11 & 1 MG X 42 PO MISC: ORAL | Status: DC

## 2013-05-03 NOTE — Progress Notes (Signed)
Patient ID: Ronald Wolf, male   DOB: February 08, 1946, 67 y.o.   MRN: [id number] 67 yo referred for chest pain.  Just had an normal ETT with [name] PA.  Seems to have dementia and very forgetful  Denies current SSCP.  Wanted results of jaw and head xrays Not in system ? Done in [facility].  Reviewed his body CT and no evidence of CA  Has colostomy SSCP was self limited over a month or so Not pleuritic  Smokes 1 ppd Interested in chantix.  Explained its use.  Occasional palpitatins No syncope  ROS: Denies fever, malais, weight loss, blurry vision, decreased visual acuity, cough, sputum, SOB, hemoptysis, pleuritic pain, palpitaitons, heartburn, abdominal pain, melena, lower extremity edema, claudication, or rash.  All other systems reviewed and negative   General: Affect appropriate Healthy:  appears stated age HEENT: normal Neck supple with no adenopathy JVP normal no bruits no thyromegaly Lungs clear with no wheezing and good diaphragmatic motion Heart:  S1/S2 no murmur,rub, gallop or click PMI normal Abdomen: benighn, BS positve, no tenderness, no AAA colostomy no bruit.  No HSM or HJR Distal pulses intact with no bruits No edema Neuro non-focal but memory poor Skin warm and dry No muscular weakness  Medications Current Outpatient Prescriptions  Medication Sig Dispense Refill  . amLODipine (NORVASC) 10 MG tablet One by mouth daily for blood pressure  30 tablet  6  . carbamazepine (TEGRETOL) 100 MG chewable tablet Chew 2 tablets (200 mg total) by mouth 2 (two) times daily.  120 tablet  2  . fish oil-omega-3 fatty acids 1000 MG capsule Take 2 g by mouth daily.      . fluticasone (FLONASE) 50 MCG/ACT nasal spray 2 sprays each nostril once a day for nasal drainage  16 g  6   No current facility-administered medications for this visit.    Allergies Iohexol  Family History: Family History  Problem Relation Age of Onset  . Prostate cancer Neg Hx   . Colon cancer Neg Hx   . Breast  cancer Neg Hx   . Heart disease Neg Hx   . Diabetes Mother   . Hypertension Neg Hx   . Healthy Sister     x 2  . Cancer Brother     stomach    Social History: History   Social History  . Marital Status: Single    Spouse Name: N/A    Number of Children: N/A  . Years of Education: N/A   Occupational History  . Not on file.   Social History Main Topics  . Smoking status: Current Every Day Smoker  . Smokeless tobacco: Never Used  . Alcohol Use: Yes  . Drug Use: No  . Sexually Active: Not on file   Other Topics Concern  . Not on file   Social History Narrative  . No narrative on file    Electrocardiogram:  Sr rate 81 LAD LVH    Assessment and Plan

## 2013-05-03 NOTE — Patient Instructions (Addendum)
Your physician has recommended you make the following change in your medication:   CHANTIX WAS SENT TO YOUR PHARMACY TO ASSIST YOU IN STOPPING CIGARETTE SMOKING  Your physician recommends that you schedule a follow-up appointment in: CALL AS NEEDED

## 2013-05-03 NOTE — Assessment & Plan Note (Signed)
Called in Chantix for patient Discussed quit date on fathers day.  F/U Dr Rogelia Rohrer

## 2013-05-03 NOTE — Assessment & Plan Note (Signed)
Atypical Resolved Normal ETT

## 2013-05-03 NOTE — Assessment & Plan Note (Signed)
Well controlled.  Continue current medications and low sodium Dash type diet.    

## 2013-05-04 ENCOUNTER — Other Ambulatory Visit: Payer: Self-pay | Admitting: Internal Medicine

## 2013-05-04 NOTE — Telephone Encounter (Signed)
Rx sent in to pharmacy. 

## 2013-05-11 ENCOUNTER — Ambulatory Visit (INDEPENDENT_AMBULATORY_CARE_PROVIDER_SITE_OTHER): Payer: Medicare Other | Admitting: Family Medicine

## 2013-05-11 ENCOUNTER — Encounter: Payer: Self-pay | Admitting: Family Medicine

## 2013-05-11 VITALS — BP 130/70 | HR 71 | Temp 98.3°F | Ht 71.0 in | Wt 170.5 lb

## 2013-05-11 DIAGNOSIS — G500 Trigeminal neuralgia: Secondary | ICD-10-CM

## 2013-05-11 DIAGNOSIS — F17200 Nicotine dependence, unspecified, uncomplicated: Secondary | ICD-10-CM

## 2013-05-11 DIAGNOSIS — R350 Frequency of micturition: Secondary | ICD-10-CM

## 2013-05-11 DIAGNOSIS — M549 Dorsalgia, unspecified: Secondary | ICD-10-CM

## 2013-05-11 DIAGNOSIS — I10 Essential (primary) hypertension: Secondary | ICD-10-CM

## 2013-05-11 DIAGNOSIS — I708 Atherosclerosis of other arteries: Secondary | ICD-10-CM

## 2013-05-11 LAB — POCT URINALYSIS DIPSTICK
Glucose, UA: NEGATIVE
Ketones, UA: NEGATIVE
Nitrite, UA: NEGATIVE
Protein, UA: 30
Spec Grav, UA: 1.01
Urobilinogen, UA: 0.2
pH, UA: 7.5

## 2013-05-11 MED ORDER — AMOXICILLIN-POT CLAVULANATE ER 1000-62.5 MG PO TB12: 1.0000 | ORAL_TABLET | Freq: Two times a day (BID) | ORAL | Status: DC

## 2013-05-11 MED ORDER — TRAMADOL HCL 50 MG PO TABS: 50.0000 mg | ORAL_TABLET | Freq: Three times a day (TID) | ORAL | Status: DC | PRN

## 2013-05-11 MED ORDER — PREDNISONE 20 MG PO TABS: ORAL_TABLET | ORAL | Status: DC

## 2013-05-11 NOTE — Patient Instructions (Addendum)
Salon Pas patches Start a probiotics such as Digestive Advantage Cranberry tab daily   Urinary Tract Infection Urinary tract infections (UTIs) can develop anywhere along your urinary tract. Your urinary tract is your body's drainage system for removing wastes and extra water. Your urinary tract includes two kidneys, two ureters, a bladder, and a urethra. Your kidneys are a pair of bean-shaped organs. Each kidney is about the size of your fist. They are located below your ribs, one on each side of your spine. CAUSES Infections are caused by microbes, which are microscopic organisms, including fungi, viruses, and bacteria. These organisms are so small that they can only be seen through a microscope. Bacteria are the microbes that most commonly cause UTIs. SYMPTOMS  Symptoms of UTIs may vary by age and gender of the patient and by the location of the infection. Symptoms in young women typically include a frequent and intense urge to urinate and a painful, burning feeling in the bladder or urethra during urination. Older women and men are more likely to be tired, shaky, and weak and have muscle aches and abdominal pain. A fever may mean the infection is in your kidneys. Other symptoms of a kidney infection include pain in your back or sides below the ribs, nausea, and vomiting. DIAGNOSIS To diagnose a UTI, your caregiver will ask you about your symptoms. Your caregiver also will ask to provide a urine sample. The urine sample will be tested for bacteria and white blood cells. White blood cells are made by your body to help fight infection. TREATMENT  Typically, UTIs can be treated with medication. Because most UTIs are caused by a bacterial infection, they usually can be treated with the use of antibiotics. The choice of antibiotic and length of treatment depend on your symptoms and the type of bacteria causing your infection. HOME CARE INSTRUCTIONS  If you were prescribed antibiotics, take them exactly  as your caregiver instructs you. Finish the medication even if you feel better after you have only taken some of the medication.  Drink enough water and fluids to keep your urine clear or pale yellow.  Avoid caffeine, tea, and carbonated beverages. They tend to irritate your bladder.  Empty your bladder often. Avoid holding urine for long periods of time.  Empty your bladder before and after sexual intercourse.  After a bowel movement, women should cleanse from front to back. Use each tissue only once. SEEK MEDICAL CARE IF:   You have back pain.  You develop a fever.  Your symptoms do not begin to resolve within 3 days. SEEK IMMEDIATE MEDICAL CARE IF:   You have severe back pain or lower abdominal pain.  You develop chills.  You have nausea or vomiting.  You have continued burning or discomfort with urination. MAKE SURE YOU:   Understand these instructions.  Will watch your condition.  Will get help right away if you are not doing well or get worse. Document Released: 09/08/2005 Document Revised: 05/30/2012 Document Reviewed: 01/07/2012 [facility] Patient Information 2014 [place], [place].

## 2013-05-13 ENCOUNTER — Encounter: Payer: Self-pay | Admitting: Family Medicine

## 2013-05-13 DIAGNOSIS — I7090 Unspecified atherosclerosis: Secondary | ICD-10-CM

## 2013-05-13 DIAGNOSIS — I708 Atherosclerosis of other arteries: Secondary | ICD-10-CM

## 2013-05-13 HISTORY — DX: Unspecified atherosclerosis: I70.90

## 2013-05-13 HISTORY — DX: Atherosclerosis of other arteries: I70.8

## 2013-05-13 NOTE — Assessment & Plan Note (Signed)
Continues to smoke, encouraged complete cessation

## 2013-05-13 NOTE — Assessment & Plan Note (Signed)
Pain comes and goes, gets some relief from Carbamezapine will increase to tid

## 2013-05-13 NOTE — Assessment & Plan Note (Signed)
Seen on recent CT abdomen and pelvis. Patient continues to smoke and has multiple comorbidities and is asymptomatic at this time. Consider referral to vascular surgery as needed.

## 2013-05-13 NOTE — Progress Notes (Signed)
Patient ID: Ronald Wolf, male   DOB: 1945/12/18, 67 y.o.   MRN: [id number] Ronald Wolf [id number] [birth year] 05/13/2013      Progress Note-Follow Up  Subjective  Chief Complaint  Chief Complaint  Patient presents with  . Follow-up    6 week    HPI  Patient is a 67 year old [place] American male who is in today for followup. He previously she has struggled with bladder cancer and after excision has been left with recurrent UTIs. He previously followed with urology but would like to have treatment here if possible. Is having some urinary frequency urgency and hematuria. No fevers or chills but he does have fatigue, malaise and weakness. No chest pain or palpitations. Continues to have trouble with his trigeminal nausea on the left side of his face but it does fluctuate in intensity. Sometimes is tolerable and sometimes not. He is getting some relief from carbamazepine.  Past Medical History  Diagnosis Date  . Depression   . Eating disorder   . Hepatitis C   . Hypertension   . Lesion of liver 04/01/2013  . Atherosclerosis of arteries 05/13/2013    Past Surgical History  Procedure Laterality Date  . 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 Prostate cancer Neg Hx   . Colon cancer Neg Hx   . Breast cancer Neg Hx   . Heart disease Neg Hx   . Diabetes Mother   . Hypertension Neg Hx   . Healthy Sister     x 2  . Cancer Brother     stomach    History   Social History  . Marital Status: Single    Spouse Name: N/A    Number of Children: N/A  . Years of Education: N/A   Occupational History  . Not on file.   Social History Main Topics  . Smoking status: Current Every Day Smoker  . Smokeless tobacco: Never Used  . Alcohol Use: Yes  . Drug Use: No  . Sexually Active: Not on file   Other Topics Concern  . Not on file   Social History Narrative  . No narrative on file    Current Outpatient Prescriptions on File Prior to  Visit  Medication Sig Dispense Refill  . amLODipine (NORVASC) 10 MG tablet One by mouth daily for blood pressure  30 tablet  6  . amLODipine (NORVASC) 5 MG tablet Take 1 tablet (5 mg total) by mouth daily.  30 tablet  5  . carbamazepine (TEGRETOL) 100 MG chewable tablet Chew 2 tablets (200 mg total) by mouth 2 (two) times daily.  120 tablet  2  . fish oil-omega-3 fatty acids 1000 MG capsule Take 2 g by mouth daily.      . fluticasone (FLONASE) 50 MCG/ACT nasal spray 2 sprays each nostril once a day for nasal drainage  16 g  6  . varenicline (CHANTIX STARTING MONTH PAK) 0.5 MG X 11 & 1 MG X 42 tablet Take one 0.5 mg tablet by mouth once daily for 3 days, then increase to one 0.5 mg tablet twice daily for 4 days, then increase to one 1 mg tablet twice daily.  53 tablet  0   No current facility-administered medications on file prior to visit.    Allergies  Allergen Reactions  . Iohexol      Desc: pt broke out in hives years ago from iv contrast. he  was given 50mg  benadryl po, 1 hr prior to ct and did fine today w/o complications.  JB     Review of Systems  Review of Systems  Constitutional: Positive for malaise/fatigue. Negative for fever.  HENT: Negative for congestion.   Eyes: Negative for discharge.  Respiratory: Negative for shortness of breath.   Cardiovascular: Negative for chest pain, palpitations and leg swelling.  Gastrointestinal: Negative for nausea, abdominal pain and diarrhea.  Genitourinary: Positive for urgency, frequency and hematuria. Negative for dysuria.  Musculoskeletal: Positive for myalgias and back pain. Negative for falls.  Skin: Negative for rash.  Neurological: Negative for loss of consciousness and headaches.  Endo/Heme/Allergies: Negative for polydipsia.  Psychiatric/Behavioral: Negative for depression and suicidal ideas. The patient is not nervous/anxious and does not have insomnia.     Objective  BP 130/70  Pulse 71  Temp(Src) 98.3 F (36.8 C)  (Oral)  Ht 5\' 11"  (1.803 m)  Wt 170 lb 8 oz (77.338 kg)  BMI 23.79 kg/m2  SpO2 97%  Physical Exam  Physical Exam  Constitutional: He is oriented to person, place, and time and well-developed, well-nourished, and in no distress. No distress.  HENT:  Head: Normocephalic and atraumatic.  Eyes: Conjunctivae are normal.  Neck: Neck supple. No thyromegaly present.  Cardiovascular: Normal rate, regular rhythm and normal heart sounds.   Pulmonary/Chest: Effort normal and breath sounds normal. No respiratory distress.  Abdominal: He exhibits no distension and no mass. There is no tenderness.  Musculoskeletal: He exhibits no edema.  Neurological: He is alert and oriented to person, place, and time.  Skin: Skin is warm.  Psychiatric: Memory, affect and judgment normal.    Lab Results  Component Value Date   TSH 2.329 06/12/2012   Lab Results  Component Value Date   WBC 7.7 12/04/2012   HGB 13.2 12/04/2012   HCT 39.4 12/04/2012   MCV 107* 12/04/2012   PLT 249 12/04/2012   Lab Results  Component Value Date   CREATININE 0.90 04/05/2013   BUN 14 04/05/2013   NA 139 04/05/2013   K 3.9 04/05/2013   CL 107 04/05/2013   CO2 24 04/05/2013   Lab Results  Component Value Date   ALT 76* 12/04/2012   AST 78* 12/04/2012   ALKPHOS 94 12/04/2012   BILITOT 0.8 12/04/2012     Assessment & Plan  HTN (hypertension) Well controlled today, no changes.   Smoking Continues to smoke, encouraged complete cessation  Atherosclerosis of arteries Seen on recent CT abdomen and pelvis. Patient continues to smoke and has multiple comorbidities and is asymptomatic at this time. Consider referral to vascular surgery as needed.  Trigeminal neuralgia Pain comes and goes, gets some relief from Carbamezapine will increase to tid

## 2013-05-13 NOTE — Assessment & Plan Note (Signed)
Well controlled today, no changes 

## 2013-05-14 LAB — URINE CULTURE: Colony Count: >=100000

## 2013-05-15 ENCOUNTER — Other Ambulatory Visit: Payer: Self-pay | Admitting: Family Medicine

## 2013-05-15 DIAGNOSIS — R350 Frequency of micturition: Secondary | ICD-10-CM

## 2013-05-15 LAB — URINALYSIS, ROUTINE W REFLEX MICROSCOPIC
Glucose, UA: NEGATIVE mg/dL
Hgb urine dipstick: NEGATIVE
Ketones, ur: NEGATIVE mg/dL
Nitrite: NEGATIVE
Protein, ur: 30 mg/dL — AB
Specific Gravity, Urine: 1.016 (ref 1.005–1.030)
Urobilinogen, UA: 1 mg/dL (ref 0.0–1.0)
pH: 7 (ref 5.0–8.0)

## 2013-05-15 NOTE — Addendum Note (Signed)
Addended by: Mervin Kung A on: 05/15/2013 03:56 PM   Modules accepted: Orders

## 2013-05-16 LAB — URINALYSIS, MICROSCOPIC ONLY
Casts: NONE SEEN
Crystals: NONE SEEN
Squamous Epithelial / LPF: NONE SEEN

## 2013-05-17 LAB — URINE CULTURE: Colony Count: >=100000

## 2013-05-17 NOTE — Progress Notes (Signed)
Quick Note:  Patient Informed and voiced understanding ______ 

## 2013-06-21 ENCOUNTER — Encounter: Payer: Self-pay | Admitting: Nurse Practitioner

## 2013-06-21 ENCOUNTER — Ambulatory Visit (INDEPENDENT_AMBULATORY_CARE_PROVIDER_SITE_OTHER): Payer: Medicare Other | Admitting: Nurse Practitioner

## 2013-06-21 VITALS — BP 120/80 | HR 83 | Temp 98.6°F | Resp 16 | Ht 71.0 in | Wt 168.1 lb

## 2013-06-21 DIAGNOSIS — R350 Frequency of micturition: Secondary | ICD-10-CM

## 2013-06-21 DIAGNOSIS — R10819 Abdominal tenderness, unspecified site: Secondary | ICD-10-CM

## 2013-06-21 DIAGNOSIS — R319 Hematuria, unspecified: Secondary | ICD-10-CM

## 2013-06-21 DIAGNOSIS — N390 Urinary tract infection, site not specified: Secondary | ICD-10-CM

## 2013-06-21 LAB — POCT URINALYSIS DIPSTICK
Glucose, UA: NEGATIVE
Ketones, UA: NEGATIVE
Nitrite, UA: POSITIVE
Protein, UA: 30
Spec Grav, UA: 1.01
Urobilinogen, UA: 0.2
pH, UA: 6.5

## 2013-06-21 MED ORDER — AMOXICILLIN-POT CLAVULANATE ER 1000-62.5 MG PO TB12: 1.0000 | ORAL_TABLET | Freq: Two times a day (BID) | ORAL | Status: DC

## 2013-06-21 NOTE — Patient Instructions (Addendum)
You have indications of a kidney infection:nitrates and white cells. Blood is showing today whereas it did not before. I will recommend another 5 days of augmentin, but want you to see urology again in 2-4 weeks to see if the blood clears. Also, our office will call you this afternoon regarding who you were referred to for GI (stomach). I think this is important, given that you have Heapatis C and have had some elevated liver enzymes. Try Vitamin C 500 mg twice daily, to see if it helps with urine odor. Pleasure to meet you!

## 2013-06-23 LAB — URINE CULTURE: Colony Count: >=100000

## 2013-06-24 NOTE — Progress Notes (Signed)
Subjective:    Ronald Wolf is a 67 y.o. male who complains of foul smelling urine and he has had a urostomy since 1990.Marland Kitchen He has had symptoms for 1 month. He recently finished a round of augmentin for the same symtpoms-states odor went away, but has now returned. He has no other symptoms, but states this odor is embarrassing-he feels he cannot empty his urostomy bag in a public bathroom due to the strong odor. Patient denies back pain, cough and fever. Patient does not have a history of recurrent UTI. Patient does not have a history of pyelonephritis. He was seeing Dr. Vonita Moss (urology) for many years before his retirement. He has not followed with urology in several months.  The following portions of the patient's history were reviewed and updated as appropriate: allergies, current medications, past medical history, past surgical history and problem list.  Review of Systems Constitutional: negative for chills and fevers Respiratory: negative for asthma, cough, pleurisy/chest pain and wheezing Cardiovascular: negative for chest pain, irregular heart beat and syncope Gastrointestinal: positive for urostomy, negative for change in bowel habits, nausea and vomiting, . Genitourinary:positive for malodorous urine, negative for typical symptoms of UTI such as dysuria, hesitancy, etc, because pt has urostomy w/collection bag.    Objective:    BP 120/80  Pulse 83  Temp(Src) 98.6 F (37 C) (Oral)  Resp 16  Ht 5\' 11"  (1.803 m)  Wt 168 lb 1.3 oz (76.241 kg)  BMI 23.45 kg/m2  SpO2 98% General appearance: alert, cooperative, appears older than stated age and no distress Eyes: positive findings: sclera mildly icteric Lungs: clear to auscultation bilaterally Heart: regular rate and rhythm, S1, S2 normal, no murmur, click, rub or gallop Abdomen: pt has urostomy w/bag RLQ. C/o tenderness to palpation RUQ & LUQ, abdomen is rounded & protruberant although he is not overweight. Extremities: extremities  normal, atraumatic, no cyanosis or edema  Laboratory:  Urine dipstick: pos for nitrites, leuks & blood.   Micro exam: pending.    Assessment:   Complicated UT-pt has urostomy w/collection bag Hematuria Abdominal tenderness- may be r/t infected urine. DD: activated Hep C    Plan:   Urine culture pending, 5 more days augmentin, F/u w/urology. Continue to monitor. Consider checking hepa func if symptoms persist. May need to f/u w/GI See pt intsructions.

## 2013-06-25 ENCOUNTER — Encounter: Payer: Self-pay | Admitting: Nurse Practitioner

## 2013-06-25 ENCOUNTER — Telehealth: Payer: Self-pay | Admitting: *Deleted

## 2013-06-25 NOTE — Telephone Encounter (Signed)
Called pt today-he has not filled anitbiotic prescription that was prescribed last week, plans to pick up today. Still having malodorous urine, although odor is somewhat improved. He is not having back pain or fever. His urine culture grew e. Coli.  I sent letter to Dr Margarita Grizzle today-urology, requesting his help with recurrent malodorous urine from urostomy bag.   I told pt that he needs to have labs done to "keep eye" on liver enzymes and h/o anemia & elevated ferritin. Labs were ordered 2/14, but not completed.  I am concerned that Ronald Wolf may have some dementia or other disorganized thought processes that interfere with his ability to self-care (keep appointments, etc.).

## 2013-06-25 NOTE — Telephone Encounter (Signed)
Please advise 

## 2013-06-25 NOTE — Telephone Encounter (Signed)
Message copied by Marlene Lard on Mon Jun 25, 2013  1:44 PM ------      Message from: WEAVER, [place] [facility]      Created: Mon Jun 25, 2013 11:05 AM       Please call [facility] in [place] and ask what doctor this patient sees. I need to send letter. thanks ------

## 2013-06-25 NOTE — Telephone Encounter (Signed)
Patient is seeing Dr. Magdalene Molly.

## 2013-06-26 ENCOUNTER — Telehealth: Payer: Self-pay

## 2013-06-26 DIAGNOSIS — R823 Hemoglobinuria: Secondary | ICD-10-CM

## 2013-06-26 DIAGNOSIS — N390 Urinary tract infection, site not specified: Secondary | ICD-10-CM

## 2013-06-26 NOTE — Telephone Encounter (Signed)
I informed patient and he states he will come in around 2 weeks. UA ordered

## 2013-06-26 NOTE — Telephone Encounter (Signed)
Received fax from [facility]. They noted they left a message for the patient to CB to schedule appt.

## 2013-07-09 LAB — URINALYSIS
Bilirubin Urine: NEGATIVE
Glucose, UA: NEGATIVE mg/dL
Ketones, ur: NEGATIVE mg/dL
Leukocytes, UA: NEGATIVE
Nitrite: POSITIVE — AB
Protein, ur: 30 mg/dL — AB
Specific Gravity, Urine: 1.011 (ref 1.005–1.030)
Urobilinogen, UA: 0.2 mg/dL (ref 0.0–1.0)
pH: 7 (ref 5.0–8.0)

## 2013-07-10 NOTE — Addendum Note (Signed)
Addended by: Court Joy on: 07/10/2013 04:58 PM   Modules accepted: Orders

## 2013-07-14 LAB — URINE CULTURE: Colony Count: >=100000

## 2013-07-16 MED ORDER — SULFAMETHOXAZOLE-TMP DS 800-160 MG PO TABS: 1.0000 | ORAL_TABLET | Freq: Two times a day (BID) | ORAL | Status: DC

## 2013-07-16 NOTE — Addendum Note (Signed)
Addended by: Court Joy on: 07/16/2013 04:59 PM   Modules accepted: Orders

## 2013-09-24 ENCOUNTER — Encounter: Payer: Self-pay | Admitting: Family Medicine

## 2013-09-24 ENCOUNTER — Ambulatory Visit (INDEPENDENT_AMBULATORY_CARE_PROVIDER_SITE_OTHER): Payer: Medicare Other | Admitting: Family Medicine

## 2013-09-24 VITALS — BP 142/88 | HR 84 | Temp 98.3°F | Ht 71.0 in | Wt 168.1 lb

## 2013-09-24 DIAGNOSIS — Z720 Tobacco use: Secondary | ICD-10-CM

## 2013-09-24 DIAGNOSIS — N390 Urinary tract infection, site not specified: Secondary | ICD-10-CM

## 2013-09-24 DIAGNOSIS — M25569 Pain in unspecified knee: Secondary | ICD-10-CM

## 2013-09-24 DIAGNOSIS — F17200 Nicotine dependence, unspecified, uncomplicated: Secondary | ICD-10-CM

## 2013-09-24 DIAGNOSIS — R05 Cough: Secondary | ICD-10-CM

## 2013-09-24 DIAGNOSIS — Z23 Encounter for immunization: Secondary | ICD-10-CM

## 2013-09-24 DIAGNOSIS — R079 Chest pain, unspecified: Secondary | ICD-10-CM

## 2013-09-24 DIAGNOSIS — R109 Unspecified abdominal pain: Secondary | ICD-10-CM

## 2013-09-24 DIAGNOSIS — M25562 Pain in left knee: Secondary | ICD-10-CM

## 2013-09-24 DIAGNOSIS — B1920 Unspecified viral hepatitis C without hepatic coma: Secondary | ICD-10-CM

## 2013-09-24 DIAGNOSIS — I10 Essential (primary) hypertension: Secondary | ICD-10-CM

## 2013-09-24 DIAGNOSIS — R059 Cough, unspecified: Secondary | ICD-10-CM

## 2013-09-24 MED ORDER — VARENICLINE TARTRATE 0.5 MG X 11 & 1 MG X 42 PO MISC: ORAL | Status: DC

## 2013-09-24 MED ORDER — VARENICLINE TARTRATE 1 MG PO TABS: 1.0000 mg | ORAL_TABLET | Freq: Two times a day (BID) | ORAL | Status: DC

## 2013-09-24 NOTE — Progress Notes (Signed)
Patient ID: Ronald Wolf, male   DOB: 11-Dec-1946, 67 y.o.   MRN: [id number] Ronald Wolf [id number] 03-29-1946 09/24/2013      Progress Note-Follow Up  Subjective  Chief Complaint  Chief Complaint  Patient presents with  . Follow-up    3 month  . Injections    flu- high dose    HPI  Patient is a 67 year old American male who is in today for followup. He continues to smoke but only a small amount at night. He is complaining of some left knee pain. It hurts medially and comes and goes. He describes aching it hurts when he is lying down when he is walking. It is somewhat better when he is sitting. No redness or warmth. No falls or injury. He is complaining of persistent cough and some shortness of breath and occasional chest pain. Acknowledges also some anorexia. No nausea vomiting or diarrhea. Some occasional abdominal cramping is noted but this is fleeting. Cough is worse each bedtime  Past Medical History  Diagnosis Date  . Depression   . Eating disorder   . Hepatitis C   . Hypertension   . Lesion of liver 04/01/2013  . Atherosclerosis of arteries 05/13/2013    Past Surgical History  Procedure Laterality Date  . 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 Prostate cancer Neg Hx   . Colon cancer Neg Hx   . Breast cancer Neg Hx   . Heart disease Neg Hx   . Diabetes Mother   . Hypertension Neg Hx   . Healthy Sister     x 2  . Cancer Brother     stomach    History   Social History  . Marital Status: Single    Spouse Name: N/A    Number of Children: N/A  . Years of Education: N/A   Occupational History  . Not on file.   Social History Main Topics  . Smoking status: Current Every Day Smoker  . Smokeless tobacco: Never Used  . Alcohol Use: Yes  . Drug Use: No  . Sexual Activity: Not on file   Other Topics Concern  . Not on file   Social History Narrative  . No narrative on file    Current Outpatient Prescriptions  on File Prior to Visit  Medication Sig Dispense Refill  . amLODipine (NORVASC) 10 MG tablet One by mouth daily for blood pressure  30 tablet  6  . amLODipine (NORVASC) 5 MG tablet Take 1 tablet (5 mg total) by mouth daily.  30 tablet  5  . amoxicillin-clavulanate (AUGMENTIN XR) 1000-62.5 MG per tablet Take 1 tablet by mouth 2 (two) times daily.  10 tablet  0  . carbamazepine (TEGRETOL) 100 MG chewable tablet Chew 2 tablets (200 mg total) by mouth 2 (two) times daily.  120 tablet  2  . fish oil-omega-3 fatty acids 1000 MG capsule Take 2 g by mouth daily.      . fluticasone (FLONASE) 50 MCG/ACT nasal spray 2 sprays each nostril once a day for nasal drainage  16 g  6  . predniSONE (DELTASONE) 20 MG tablet 2 tabs po daily x 5 days and 1 tab po daily x 5 days and then 1/2 tab po daily  20 tablet  1  . sulfamethoxazole-trimethoprim (BACTRIM DS) 800-160 MG per tablet Take 1 tablet by mouth 2 (two) times daily.  14 tablet  0  .  traMADol (ULTRAM) 50 MG tablet Take 1 tablet (50 mg total) by mouth every 8 (eight) hours as needed for pain.  60 tablet  1  . varenicline (CHANTIX STARTING MONTH PAK) 0.5 MG X 11 & 1 MG X 42 tablet Take one 0.5 mg tablet by mouth once daily for 3 days, then increase to one 0.5 mg tablet twice daily for 4 days, then increase to one 1 mg tablet twice daily.  53 tablet  0   No current facility-administered medications on file prior to visit.    Allergies  Allergen Reactions  . Iohexol      Desc: pt broke out in hives years ago from iv contrast. he was given 50mg  benadryl po, 1 hr prior to ct and did fine today w/o complications.  JB     Review of Systems  Review of Systems  Constitutional: Positive for malaise/fatigue and diaphoresis. Negative for fever.  HENT: Negative for congestion.   Eyes: Negative for discharge.  Respiratory: Positive for cough and sputum production. Negative for shortness of breath.   Cardiovascular: Positive for palpitations. Negative for chest pain  and leg swelling.  Gastrointestinal: Negative for nausea and diarrhea.  Genitourinary: Negative for dysuria.  Musculoskeletal: Negative for falls.  Skin: Negative for rash.  Neurological: Negative for loss of consciousness and headaches.  Endo/Heme/Allergies: Negative for polydipsia.  Psychiatric/Behavioral: Negative for depression and suicidal ideas. The patient is not nervous/anxious and does not have insomnia.     Objective  BP 150/84  Pulse 84  Temp(Src) 98.3 F (36.8 C) (Oral)  Ht 5\' 11"  (1.803 m)  Wt 168 lb 1.9 oz (76.259 kg)  BMI 23.46 kg/m2  SpO2 98%  Physical Exam  Physical Exam  Constitutional: He is oriented to person, place, and time and well-developed, well-nourished, and in no distress. No distress.  HENT:  Head: Normocephalic and atraumatic.  Eyes: Conjunctivae are normal.  Neck: Neck supple. No thyromegaly present.  Cardiovascular: Normal rate, regular rhythm and normal heart sounds.   No murmur heard. Pulmonary/Chest: Effort normal and breath sounds normal. No respiratory distress.  Abdominal: He exhibits no distension and no mass. There is no tenderness.  Musculoskeletal: He exhibits no edema.  Neurological: He is alert and oriented to person, place, and time.  Skin: Skin is warm.  Psychiatric: Memory, affect and judgment normal.    Lab Results  Component Value Date   TSH 2.329 06/12/2012   Lab Results  Component Value Date   WBC 7.7 12/04/2012   HGB 13.2 12/04/2012   HCT 39.4 12/04/2012   MCV 107* 12/04/2012   PLT 249 12/04/2012   Lab Results  Component Value Date   CREATININE 0.90 04/05/2013   BUN 14 04/05/2013   NA 139 04/05/2013   K 3.9 04/05/2013   CL 107 04/05/2013   CO2 24 04/05/2013   Lab Results  Component Value Date   ALT 76* 12/04/2012   AST 78* 12/04/2012   ALKPHOS 94 12/04/2012   BILITOT 0.8 12/04/2012     Assessment & Plan  HTN (hypertension) Improved on recheck, no changes today  Medial knee pain Xray did show some  degenerative changes. Have referred to ortho for furthere consideration, may try salon pas cream prn til seen  Smoking Still smoking each night encouraged cessation  Abdominal  pain, other specified site Intermittent, weight stable will monitor  Chest pain Unclear etiology has been seen by cardiology, is a current smoker with some sob with exertion and a cough. CXR nondiagnostic  will set up CT of chest to r/o malignancy

## 2013-09-24 NOTE — Patient Instructions (Signed)
Knee Pain  The knee is the complex joint between your thigh and your lower leg. It is made up of bones, tendons, ligaments, and cartilage. The bones that make up the knee are:   The femur in the thigh.   The tibia and fibula in the lower leg.   The patella or kneecap riding in the groove on the lower femur.  CAUSES   Knee pain is a common complaint with many causes. A few of these causes are:   Injury, such as:   A ruptured ligament or tendon injury.   Torn cartilage.   Medical conditions, such as:   Gout   Arthritis   Infections   Overuse, over training or overdoing a physical activity.  Knee pain can be minor or severe. Knee pain can accompany debilitating injury. Minor knee problems often respond well to self-care measures or get well on their own. More serious injuries may need medical intervention or even surgery.  SYMPTOMS  The knee is complex. Symptoms of knee problems can vary widely. Some of the problems are:   Pain with movement and weight bearing.   Swelling and tenderness.   Buckling of the knee.   Inability to straighten or extend your knee.   Your knee locks and you cannot straighten it.   Warmth and redness with pain and fever.   Deformity or dislocation of the kneecap.  DIAGNOSIS   Determining what is wrong may be very straight forward such as when there is an injury. It can also be challenging because of the complexity of the knee. Tests to make a diagnosis may include:   Your caregiver taking a history and doing a physical exam.   Routine X-rays can be used to rule out other problems. X-rays will not reveal a cartilage tear. Some injuries of the knee can be diagnosed by:   Arthroscopy a surgical technique by which a small video camera is inserted through tiny incisions on the sides of the knee. This procedure is used to examine and repair internal knee joint problems. Tiny instruments can be used during arthroscopy to repair the torn knee cartilage (meniscus).   Arthrography  is a radiology technique. A contrast liquid is directly injected into the knee joint. Internal structures of the knee joint then become visible on X-ray film.   An MRI scan is a non x-ray radiology procedure in which magnetic fields and a computer produce two- or three-dimensional images of the inside of the knee. Cartilage tears are often visible using an MRI scanner. MRI scans have largely replaced arthrography in diagnosing cartilage tears of the knee.   Blood work.   Examination of the fluid that helps to lubricate the knee joint (synovial fluid). This is done by taking a sample out using a needle and a syringe.  TREATMENT  The treatment of knee problems depends on the cause. Some of these treatments are:   Depending on the injury, proper casting, splinting, surgery or physical therapy care will be needed.   Give yourself adequate recovery time. Do not overuse your joints. If you begin to get sore during workout routines, back off. Slow down or do fewer repetitions.   For repetitive activities such as cycling or running, maintain your strength and nutrition.   Alternate muscle groups. For example if you are a weight lifter, work the upper body on one day and the lower body the next.   Either tight or weak muscles do not give the proper support for your   knee. Tight or weak muscles do not absorb the stress placed on the knee joint. Keep the muscles surrounding the knee strong.   Take care of mechanical problems.   If you have flat feet, orthotics or special shoes may help. See your caregiver if you need help.   Arch supports, sometimes with wedges on the inner or outer aspect of the heel, can help. These can shift pressure away from the side of the knee most bothered by osteoarthritis.   A brace called an "unloader" brace also may be used to help ease the pressure on the most arthritic side of the knee.   If your caregiver has prescribed crutches, braces, wraps or ice, use as directed. The acronym for  this is PRICE. This means protection, rest, ice, compression and elevation.   Nonsteroidal anti-inflammatory drugs (NSAID's), can help relieve pain. But if taken immediately after an injury, they may actually increase swelling. Take NSAID's with food in your stomach. Stop them if you develop stomach problems. Do not take these if you have a history of ulcers, stomach pain or bleeding from the bowel. Do not take without your caregiver's approval if you have problems with fluid retention, heart failure, or kidney problems.   For ongoing knee problems, physical therapy may be helpful.   Glucosamine and chondroitin are over-the-counter dietary supplements. Both may help relieve the pain of osteoarthritis in the knee. These medicines are different from the usual anti-inflammatory drugs. Glucosamine may decrease the rate of cartilage destruction.   Injections of a corticosteroid drug into your knee joint may help reduce the symptoms of an arthritis flare-up. They may provide pain relief that lasts a few months. You may have to wait a few months between injections. The injections do have a small increased risk of infection, water retention and elevated blood sugar levels.   Hyaluronic acid injected into damaged joints may ease pain and provide lubrication. These injections may work by reducing inflammation. A series of shots may give relief for as long as 6 months.   Topical painkillers. Applying certain ointments to your skin may help relieve the pain and stiffness of osteoarthritis. Ask your pharmacist for suggestions. Many over the-counter products are approved for temporary relief of arthritis pain.   In some countries, doctors often prescribe topical NSAID's for relief of chronic conditions such as arthritis and tendinitis. A review of treatment with NSAID creams found that they worked as well as oral medications but without the serious side effects.  PREVENTION   Maintain a healthy weight. Extra pounds put  more strain on your joints.   Get strong, stay limber. Weak muscles are a common cause of knee injuries. Stretching is important. Include flexibility exercises in your workouts.   Be smart about exercise. If you have osteoarthritis, chronic knee pain or recurring injuries, you may need to change the way you exercise. This does not mean you have to stop being active. If your knees ache after jogging or playing basketball, consider switching to swimming, water aerobics or other low-impact activities, at least for a few days a week. Sometimes limiting high-impact activities will provide relief.   Make sure your shoes fit well. Choose footwear that is right for your sport.   Protect your knees. Use the proper gear for knee-sensitive activities. Use kneepads when playing volleyball or laying carpet. Buckle your seat belt every time you drive. Most shattered kneecaps occur in car accidents.   Rest when you are tired.  SEEK MEDICAL CARE IF:     You have knee pain that is continual and does not seem to be getting better.   SEEK IMMEDIATE MEDICAL CARE IF:   Your knee joint feels hot to the touch and you have a high fever.  MAKE SURE YOU:    Understand these instructions.   Will watch your condition.   Will get help right away if you are not doing well or get worse.  Document Released: 09/26/2007 Document Revised: 02/21/2012 Document Reviewed: 09/26/2007  [employer] Patient Information 2014 [employer]

## 2013-09-25 ENCOUNTER — Inpatient Hospital Stay: Admission: RE | Admit: 2013-09-25 | Payer: Medicare Other | Source: Ambulatory Visit

## 2013-09-25 ENCOUNTER — Ambulatory Visit
Admission: RE | Admit: 2013-09-25 | Discharge: 2013-09-25 | Disposition: A | Discharge status: Home / Self Care | Payer: Medicare Other | Source: Ambulatory Visit | Attending: Family Medicine | Admitting: Family Medicine

## 2013-09-25 ENCOUNTER — Other Ambulatory Visit: Payer: Medicare Other

## 2013-09-25 DIAGNOSIS — R059 Cough, unspecified: Secondary | ICD-10-CM

## 2013-09-25 DIAGNOSIS — M25562 Pain in left knee: Secondary | ICD-10-CM

## 2013-09-25 DIAGNOSIS — R05 Cough: Secondary | ICD-10-CM

## 2013-09-26 ENCOUNTER — Telehealth: Payer: Self-pay | Admitting: *Deleted

## 2013-09-26 ENCOUNTER — Encounter: Payer: Self-pay | Admitting: Family Medicine

## 2013-09-26 ENCOUNTER — Other Ambulatory Visit: Payer: Medicare Other

## 2013-09-26 DIAGNOSIS — R109 Unspecified abdominal pain: Secondary | ICD-10-CM

## 2013-09-26 DIAGNOSIS — R059 Cough, unspecified: Secondary | ICD-10-CM

## 2013-09-26 DIAGNOSIS — M25569 Pain in unspecified knee: Secondary | ICD-10-CM

## 2013-09-26 DIAGNOSIS — R05 Cough: Secondary | ICD-10-CM | POA: Insufficient documentation

## 2013-09-26 HISTORY — DX: Cough, unspecified: R05.9

## 2013-09-26 HISTORY — DX: Unspecified abdominal pain: R10.9

## 2013-09-26 HISTORY — DX: Pain in unspecified knee: M25.569

## 2013-09-26 NOTE — Telephone Encounter (Signed)
So I have already referred him to orthopaedics for his knee, make sure he knows when this appt is. I have also ordered him a CT chest to investigate his cough, cp etc

## 2013-09-26 NOTE — Assessment & Plan Note (Signed)
Improved on recheck, no changes today 

## 2013-09-26 NOTE — Assessment & Plan Note (Signed)
Unclear etiology has been seen by cardiology, is a current smoker with some sob with exertion and a cough. CXR nondiagnostic will set up CT of chest to r/o malignancy

## 2013-09-26 NOTE — Assessment & Plan Note (Signed)
Xray did show some degenerative changes. Have referred to ortho for furthere consideration, may try salon pas cream prn til seen

## 2013-09-26 NOTE — Assessment & Plan Note (Signed)
Still smoking each night encouraged cessation

## 2013-09-26 NOTE — Telephone Encounter (Signed)
Message copied by Kathi Simpers on Wed Sep 26, 2013  4:24 PM ------      Message from: Danise Edge A      Created: Tue Sep 25, 2013  7:20 PM       Notify arthritis noted in knee, mild but no acute disease ------

## 2013-09-26 NOTE — Assessment & Plan Note (Signed)
Intermittent, weight stable will monitor

## 2013-09-26 NOTE — Telephone Encounter (Signed)
Notified pt of result and he voices understanding. Pt wants to know what Provider recommends. He states that he was advised to try an OTC cream first but has not picked it up yet. Advised him to proceed with previous recommendation. Please advise if there are further instructions?

## 2013-09-27 NOTE — Telephone Encounter (Signed)
Patient went to Ortho Tues, 10.14.14, confirm arthritis; pt also had CT scan done on Tues, 10.14.14, informed we will call with results. Pt will be in to give Urine sample to lab shortly/SLS

## 2013-09-28 ENCOUNTER — Other Ambulatory Visit (HOSPITAL_BASED_OUTPATIENT_CLINIC_OR_DEPARTMENT_OTHER): Payer: Medicare Other

## 2013-10-01 ENCOUNTER — Telehealth: Payer: Self-pay | Admitting: *Deleted

## 2013-10-01 NOTE — Telephone Encounter (Signed)
He can have Voltaren 1 % gel but it is expensive and some insurances pay and some do not. Apply 4 gm topically to affected joint qid prn pain, max of 32 gm per day. Disp #5 tubes with 1 rf, for OA of knees

## 2013-10-01 NOTE — Telephone Encounter (Signed)
Received call from pt. He states the OTC cream for his knee has not helped. He states he just saw his chiropractor that told him to call us and request rx for voltaren gel.  Advised pt to have chiropractor fax [place] their office note and we would call him back with response.  Please advise.

## 2013-10-02 ENCOUNTER — Ambulatory Visit (HOSPITAL_BASED_OUTPATIENT_CLINIC_OR_DEPARTMENT_OTHER): Admission: RE | Admit: 2013-10-02 | Payer: Medicare Other | Source: Ambulatory Visit

## 2013-10-02 MED ORDER — DICLOFENAC SODIUM 1 % TD GEL: 4.0000 g | Freq: Four times a day (QID) | TRANSDERMAL | Status: DC | PRN

## 2013-10-02 NOTE — Telephone Encounter (Signed)
Notified pt and he voices understanding. Rx sent. 

## 2013-10-28 ENCOUNTER — Other Ambulatory Visit: Payer: Self-pay | Admitting: Family Medicine

## 2013-10-29 NOTE — Telephone Encounter (Signed)
Please advise Amlodipine refill? Is pt taking Amlodipine 10 mg and 5 mg?

## 2013-10-29 NOTE — Telephone Encounter (Signed)
I signed the rx I do not know what I signed

## 2013-10-29 NOTE — Telephone Encounter (Signed)
Looks like we dropped it to 5 unless someone else increased it again

## 2014-01-01 ENCOUNTER — Other Ambulatory Visit (HOSPITAL_COMMUNITY): Payer: Self-pay | Admitting: Urology

## 2014-01-01 DIAGNOSIS — N281 Cyst of kidney, acquired: Secondary | ICD-10-CM

## 2014-01-08 ENCOUNTER — Ambulatory Visit (HOSPITAL_COMMUNITY)
Admission: RE | Admit: 2014-01-08 | Discharge: 2014-01-08 | Disposition: A | Discharge status: Home / Self Care | Payer: Medicare Other | Source: Ambulatory Visit | Attending: Urology | Admitting: Urology

## 2014-01-08 DIAGNOSIS — I10 Essential (primary) hypertension: Secondary | ICD-10-CM | POA: Insufficient documentation

## 2014-01-08 DIAGNOSIS — N281 Cyst of kidney, acquired: Secondary | ICD-10-CM

## 2014-06-21 ENCOUNTER — Emergency Department (HOSPITAL_BASED_OUTPATIENT_CLINIC_OR_DEPARTMENT_OTHER): Payer: Medicare Other

## 2014-06-21 ENCOUNTER — Encounter (HOSPITAL_BASED_OUTPATIENT_CLINIC_OR_DEPARTMENT_OTHER): Payer: Self-pay | Admitting: Emergency Medicine

## 2014-06-21 ENCOUNTER — Encounter: Payer: Self-pay | Admitting: Family Medicine

## 2014-06-21 ENCOUNTER — Emergency Department (HOSPITAL_BASED_OUTPATIENT_CLINIC_OR_DEPARTMENT_OTHER)
Admission: EM | Admit: 2014-06-21 | Discharge: 2014-06-21 | Disposition: A | Discharge status: Home / Self Care | Payer: Medicare Other | Attending: Emergency Medicine | Admitting: Emergency Medicine

## 2014-06-21 ENCOUNTER — Ambulatory Visit (INDEPENDENT_AMBULATORY_CARE_PROVIDER_SITE_OTHER): Payer: Medicare Other | Admitting: Family Medicine

## 2014-06-21 VITALS — BP 130/64 | HR 80 | Temp 99.4°F | Ht 71.0 in | Wt 164.0 lb

## 2014-06-21 DIAGNOSIS — K5900 Constipation, unspecified: Secondary | ICD-10-CM

## 2014-06-21 DIAGNOSIS — R0602 Shortness of breath: Secondary | ICD-10-CM

## 2014-06-21 DIAGNOSIS — R630 Anorexia: Secondary | ICD-10-CM

## 2014-06-21 DIAGNOSIS — J189 Pneumonia, unspecified organism: Secondary | ICD-10-CM

## 2014-06-21 DIAGNOSIS — R002 Palpitations: Secondary | ICD-10-CM

## 2014-06-21 DIAGNOSIS — J209 Acute bronchitis, unspecified: Secondary | ICD-10-CM | POA: Insufficient documentation

## 2014-06-21 DIAGNOSIS — I10 Essential (primary) hypertension: Secondary | ICD-10-CM

## 2014-06-21 DIAGNOSIS — Z8619 Personal history of other infectious and parasitic diseases: Secondary | ICD-10-CM | POA: Insufficient documentation

## 2014-06-21 DIAGNOSIS — F329 Major depressive disorder, single episode, unspecified: Secondary | ICD-10-CM | POA: Insufficient documentation

## 2014-06-21 DIAGNOSIS — F17200 Nicotine dependence, unspecified, uncomplicated: Secondary | ICD-10-CM

## 2014-06-21 DIAGNOSIS — F3289 Other specified depressive episodes: Secondary | ICD-10-CM | POA: Insufficient documentation

## 2014-06-21 DIAGNOSIS — Z8719 Personal history of other diseases of the digestive system: Secondary | ICD-10-CM | POA: Insufficient documentation

## 2014-06-21 DIAGNOSIS — R198 Other specified symptoms and signs involving the digestive system and abdomen: Secondary | ICD-10-CM

## 2014-06-21 DIAGNOSIS — J159 Unspecified bacterial pneumonia: Secondary | ICD-10-CM | POA: Insufficient documentation

## 2014-06-21 DIAGNOSIS — R079 Chest pain, unspecified: Secondary | ICD-10-CM

## 2014-06-21 DIAGNOSIS — D649 Anemia, unspecified: Secondary | ICD-10-CM

## 2014-06-21 DIAGNOSIS — R634 Abnormal weight loss: Secondary | ICD-10-CM

## 2014-06-21 DIAGNOSIS — Z791 Long term (current) use of non-steroidal anti-inflammatories (NSAID): Secondary | ICD-10-CM | POA: Insufficient documentation

## 2014-06-21 DIAGNOSIS — Z79899 Other long term (current) drug therapy: Secondary | ICD-10-CM | POA: Insufficient documentation

## 2014-06-21 DIAGNOSIS — J40 Bronchitis, not specified as acute or chronic: Secondary | ICD-10-CM

## 2014-06-21 DIAGNOSIS — R194 Change in bowel habit: Secondary | ICD-10-CM

## 2014-06-21 DIAGNOSIS — R0789 Other chest pain: Secondary | ICD-10-CM

## 2014-06-21 DIAGNOSIS — Z8739 Personal history of other diseases of the musculoskeletal system and connective tissue: Secondary | ICD-10-CM | POA: Insufficient documentation

## 2014-06-21 DIAGNOSIS — R1013 Epigastric pain: Secondary | ICD-10-CM

## 2014-06-21 HISTORY — DX: Abnormal weight loss: R63.4

## 2014-06-21 HISTORY — DX: Palpitations: R00.2

## 2014-06-21 LAB — TROPONIN I
Troponin I: <0.3 ng/mL (ref <0.30)
Troponin I: <0.3 ng/mL (ref <0.30)

## 2014-06-21 LAB — CBC WITH DIFFERENTIAL/PLATELET
Basophils Absolute: 0 10*3/uL (ref 0.0–0.1)
Basophils Relative: 0 % (ref 0–1)
Eosinophils Absolute: 0.1 10*3/uL (ref 0.0–0.7)
Eosinophils Relative: 1 % (ref 0–5)
HCT: 39.6 % (ref 39.0–52.0)
Hemoglobin: 13.6 g/dL (ref 13.0–17.0)
Lymphocytes Relative: 42 % (ref 12–46)
Lymphs Abs: 4 10*3/uL (ref 0.7–4.0)
MCH: 35.6 pg — ABNORMAL HIGH (ref 26.0–34.0)
MCHC: 34.3 g/dL (ref 30.0–36.0)
MCV: 103.7 fL — ABNORMAL HIGH (ref 78.0–100.0)
Monocytes Absolute: 1.2 10*3/uL — ABNORMAL HIGH (ref 0.1–1.0)
Monocytes Relative: 12 % (ref 3–12)
Neutro Abs: 4.3 10*3/uL (ref 1.7–7.7)
Neutrophils Relative %: 45 % (ref 43–77)
Platelets: 214 10*3/uL (ref 150–400)
RBC: 3.82 MIL/uL — ABNORMAL LOW (ref 4.22–5.81)
RDW: 12.4 % (ref 11.5–15.5)
WBC: 9.5 10*3/uL (ref 4.0–10.5)

## 2014-06-21 LAB — COMPREHENSIVE METABOLIC PANEL
ALT: 33 U/L (ref 0–53)
AST: 32 U/L (ref 0–37)
Albumin: 3.8 g/dL (ref 3.5–5.2)
Alkaline Phosphatase: 75 U/L (ref 39–117)
Anion gap: 13 (ref 5–15)
BUN: 15 mg/dL (ref 6–23)
CO2: 24 mEq/L (ref 19–32)
Calcium: 10 mg/dL (ref 8.4–10.5)
Chloride: 104 mEq/L (ref 96–112)
Creatinine, Ser: 1.1 mg/dL (ref 0.50–1.35)
GFR calc Af Amer: 78 mL/min — ABNORMAL LOW (ref >90)
GFR calc non Af Amer: 67 mL/min — ABNORMAL LOW (ref >90)
Glucose, Bld: 102 mg/dL — ABNORMAL HIGH (ref 70–99)
Potassium: 4.2 mEq/L (ref 3.7–5.3)
Sodium: 141 mEq/L (ref 137–147)
Total Bilirubin: 0.6 mg/dL (ref 0.3–1.2)
Total Protein: 7.8 g/dL (ref 6.0–8.3)

## 2014-06-21 LAB — D-DIMER, QUANTITATIVE (NOT AT ARMC): D-Dimer, Quant: 0.48 ug/mL-FEU (ref 0.00–0.48)

## 2014-06-21 MED ORDER — DEXTROSE 5 % IV SOLN
500.0000 mg | Freq: Once | INTRAVENOUS | Status: AC
  Administered 2014-06-21: 500 mg via INTRAVENOUS
  Filled 2014-06-21: qty 500

## 2014-06-21 MED ORDER — ALBUTEROL SULFATE (2.5 MG/3ML) 0.083% IN NEBU
5.0000 mg | INHALATION_SOLUTION | Freq: Once | RESPIRATORY_TRACT | Status: AC
  Administered 2014-06-21: 5 mg via RESPIRATORY_TRACT
  Filled 2014-06-21: qty 6

## 2014-06-21 MED ORDER — ALBUTEROL SULFATE HFA 108 (90 BASE) MCG/ACT IN AERS: 1.0000 | INHALATION_SPRAY | Freq: Four times a day (QID) | RESPIRATORY_TRACT | Status: DC | PRN

## 2014-06-21 MED ORDER — AZITHROMYCIN 250 MG PO TABS: 250.0000 mg | ORAL_TABLET | Freq: Every day | ORAL | Status: DC

## 2014-06-21 NOTE — Assessment & Plan Note (Signed)
Continues to smoke.

## 2014-06-21 NOTE — Discharge Instructions (Signed)
Use albuterol as needed for cough or shortness of breath.   Take azithromycin as prescribed.   Stay hydrated.   You need outpatient stress test with your doctor for further workup.   Follow up with your doctor.   Return to ER if you have severe chest pain, shortness of breath.

## 2014-06-21 NOTE — ED Notes (Signed)
Pt c/o palpations/ SOB  with walking  X 1 week, snet from [facility] office with EKg changes

## 2014-06-21 NOTE — Assessment & Plan Note (Signed)
Needs cbc labs

## 2014-06-21 NOTE — Progress Notes (Signed)
Patient ID: Ronald Wolf, male   DOB: 1946-12-11, 68 y.o.   MRN: [id number] Ronald Wolf [id number] [birth year] 06/21/2014      Progress Note-Follow Up  Subjective  Chief Complaint  Chief Complaint  Patient presents with  . Follow-up    HPI  Patient is a 68 year old male in today for routine medical care. Patient is in today for the first time in several months complaining of escalation of chest pain, palpitations, epigastric pain, anorexia, weight loss. He has had trouble off and on for the last 6-12 months is changes in bowel habits. Noting he went from once daily to every third day with the stool. No bloody or tarry stool. He's had intermittent episodes of palpitations occurring both day and night over the last 6 months. In the last week he has come notably worse. He has been noting worsening shortness of breath and dyspnea on exertion for roughly 6 months and that has also escalated. He notes no appetite and has not really today. Even small bites of food and he can feel nauseous and full. No recent fevers or chills. No significant congestion or complaints of headache. Palpitations can be brought on by exertion but also can occur at rest and have directly over the last week. He had an episode of feeling palpitations while in the office but it resolved before we were able to auscultate. No chest pain in office  Past Medical History  Diagnosis Date  . Depression   . Eating disorder   . Hepatitis C   . Hypertension   . Lesion of liver 04/01/2013  . Atherosclerosis of arteries 05/13/2013  . Medial knee pain 09/26/2013  . Abdominal pain, other specified site 09/26/2013  . Cough 09/26/2013  . Palpitations 06/21/2014  . Loss of weight 06/21/2014    Past Surgical History  Procedure Laterality Date  . 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 Prostate cancer Neg Hx   . Colon cancer Neg Hx   . Breast cancer Neg Hx   . Heart disease Neg Hx   .  Diabetes Mother   . Hypertension Neg Hx   . Healthy Sister     x 2  . Cancer Brother     stomach    History   Social History  . Marital Status: Single    Spouse Name: N/A    Number of Children: N/A  . Years of Education: N/A   Occupational History  . Not on file.   Social History Main Topics  . Smoking status: Current Every Day Smoker  . Smokeless tobacco: Never Used  . Alcohol Use: Yes  . Drug Use: No  . Sexual Activity: Not on file   Other Topics Concern  . Not on file   Social History Narrative  . No narrative on file    Current Outpatient Prescriptions on File Prior to Visit  Medication Sig Dispense Refill  . amLODipine (NORVASC) 10 MG tablet One by mouth daily for blood pressure  30 tablet  6  . amLODipine (NORVASC) 5 MG tablet take 1 tablet by mouth once daily  30 tablet  5  . diclofenac sodium (VOLTAREN) 1 % GEL Apply 4 g topically 4 (four) times daily as needed. Max 32gm per day.  5 Tube  1  . fish oil-omega-3 fatty acids 1000 MG capsule Take 2 g by mouth daily.      Marland Kitchen  traMADol (ULTRAM) 50 MG tablet Take 1 tablet (50 mg total) by mouth every 8 (eight) hours as needed for pain.  60 tablet  1  . carbamazepine (TEGRETOL) 100 MG chewable tablet Chew 2 tablets (200 mg total) by mouth 2 (two) times daily.  120 tablet  2   No current facility-administered medications on file prior to visit.    Allergies  Allergen Reactions  . Iohexol      Desc: pt broke out in hives years ago from iv contrast. he was given 50mg  benadryl po, 1 hr prior to ct and did fine today w/o complications.  JB     Review of Systems  Review of Systems  Constitutional: Positive for weight loss and malaise/fatigue. Negative for fever and chills.  HENT: Negative for congestion, hearing loss and nosebleeds.   Eyes: Negative for discharge.  Respiratory: Positive for shortness of breath. Negative for cough, sputum production and wheezing.   Cardiovascular: Positive for chest pain and  palpitations. Negative for leg swelling.  Gastrointestinal: Positive for nausea, abdominal pain and constipation. Negative for heartburn, vomiting, diarrhea, blood in stool and melena.  Genitourinary: Negative for dysuria, urgency, frequency and hematuria.  Musculoskeletal: Positive for myalgias. Negative for back pain and falls.  Skin: Negative for rash.  Neurological: Negative for dizziness, tremors, sensory change, focal weakness, loss of consciousness, weakness and headaches.  Endo/Heme/Allergies: Negative for polydipsia. Does not bruise/bleed easily.  Psychiatric/Behavioral: Negative for depression and suicidal ideas. The patient is not nervous/anxious and does not have insomnia.     Objective  BP 130/64  Pulse 80  Temp(Src) 99.4 F (37.4 C) (Oral)  Ht 5\' 11"  (1.803 m)  Wt 164 lb (74.39 kg)  BMI 22.88 kg/m2  SpO2 97%  Physical Exam  Physical Exam  Constitutional: He is oriented to person, place, and time and well-developed, well-nourished, and in no distress. No distress.  HENT:  Head: Normocephalic and atraumatic.  Eyes: Conjunctivae are normal.  Neck: Neck supple. No thyromegaly present.  Cardiovascular: Normal rate, regular rhythm and normal heart sounds.   No murmur heard. Pulmonary/Chest: Effort normal and breath sounds normal. No respiratory distress.  Abdominal: Soft. Bowel sounds are normal. He exhibits no distension and no mass. There is tenderness.  Epigastric tenderness noted  Musculoskeletal: He exhibits no edema.  Neurological: He is alert and oriented to person, place, and time.  Skin: Skin is warm.  Psychiatric: Memory, affect and judgment normal.    Lab Results  Component Value Date   TSH 2.329 06/12/2012   Lab Results  Component Value Date   WBC 7.7 12/04/2012   HGB 13.2 12/04/2012   HCT 39.4 12/04/2012   MCV 107* 12/04/2012   PLT 249 12/04/2012   Lab Results  Component Value Date   CREATININE 0.90 04/05/2013   BUN 14 04/05/2013   NA 139  04/05/2013   K 3.9 04/05/2013   CL 107 04/05/2013   CO2 24 04/05/2013   Lab Results  Component Value Date   ALT 76* 12/04/2012   AST 78* 12/04/2012   ALKPHOS 94 12/04/2012   BILITOT 0.8 12/04/2012   No results found for this basename: CHOL   No results found for this basename: HDL   No results found for this basename: LDLCALC   No results found for this basename: TRIG   No results found for this basename: CHOLHDL     Assessment & Plan  Palpitations Patient is in today for the first time in several months complaining of a  one-week history of escalation as palpitations, shortness of breath, chest pain, epigastric pain, anorexia. 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 Due to his risk factors and is worsening symptoms we will refer him to the emergency room for more emergent workup. Due to his weight loss, epigastric pain, nausea and loss of appetite there are also concerns for intra-abdominal pathology including H. Pylori, constipation and or concerning disease. Over time would benefit from abdominal x-ray possibly CAT scanning as well as blood work to rule out H. Pyloric, pancreatic disease etc. Needs rule out for acute cardiac process  Smoking Continue sto smoke  Anemia Needs cbc labs  HTN (hypertension) Well controlled, no changes to meds. Encouraged heart healthy diet such as the DASH diet and exercise as tolerated.   Loss of weight Differential is extensive, intraabdominal pathology needs to be ruled out   .

## 2014-06-21 NOTE — Assessment & Plan Note (Signed)
Differential is extensive, intraabdominal pathology needs to be ruled out

## 2014-06-21 NOTE — Patient Instructions (Signed)
And in the

## 2014-06-21 NOTE — Assessment & Plan Note (Signed)
Well controlled, no changes to meds. Encouraged heart healthy diet such as the DASH diet and exercise as tolerated.  °

## 2014-06-21 NOTE — ED Provider Notes (Signed)
CSN: [id number]     Arrival date & time 06/21/14  1720 History   First MD Initiated Contact with Patient 06/21/14 1737     Chief Complaint  Patient presents with  . Palpitations     (Consider location/radiation/quality/duration/timing/severity/associated sxs/prior Treatment) The history is provided by the patient.  Ronald Wolf is a 68 y.o. male hx of hep C, HTN, here with palpitations, shortness of breath, weight loss. Weight loss over several months. Also shortness of breath and palpitations when he ambulates over the last week. Has intermittent chest pain as well. Denies hx of CAD or stents. 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 No active chest pain. Still smoking and has some productive cough and low grade temp.    Past Medical History  Diagnosis Date  . Depression   . Eating disorder   . Hepatitis C   . Hypertension   . Lesion of liver 04/01/2013  . Atherosclerosis of arteries 05/13/2013  . Medial knee pain 09/26/2013  . Abdominal pain, other specified site 09/26/2013  . Cough 09/26/2013  . Palpitations 06/21/2014  . Loss of weight 06/21/2014   Past Surgical History  Procedure Laterality Date  . 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 Prostate cancer Neg Hx   . Colon cancer Neg Hx   . Breast cancer Neg Hx   . Heart disease Neg Hx   . Diabetes Mother   . Hypertension Neg Hx   . Healthy Sister     x 2  . Cancer Brother     stomach   History  Substance Use Topics  . Smoking status: Current Every Day Smoker -- 1.00 packs/day  . Smokeless tobacco: Never Used  . Alcohol Use: Yes    Review of Systems  Respiratory: Positive for cough and shortness of breath.   Cardiovascular: Positive for palpitations.  All other systems reviewed and are negative.     Allergies  Iohexol  Home Medications   Prior to Admission medications   Medication Sig Start Date End Date Taking? Authorizing Provider  amLODipine  (NORVASC) 10 MG tablet One by mouth daily for blood pressure 11/16/12   Burnice Logan, MD  amLODipine (NORVASC) 5 MG tablet take 1 tablet by mouth once daily 10/28/13   Mosie Lukes, MD  carbamazepine (TEGRETOL) 100 MG chewable tablet Chew 2 tablets (200 mg total) by mouth 2 (two) times daily. 03/29/13   Mosie Lukes, MD  diclofenac sodium (VOLTAREN) 1 % GEL Apply 4 g topically 4 (four) times daily as needed. Max 32gm per day. 10/02/13   Mosie Lukes, MD  fish oil-omega-3 fatty acids 1000 MG capsule Take 2 g by mouth daily.    Historical Provider, MD  traMADol (ULTRAM) 50 MG tablet Take 1 tablet (50 mg total) by mouth every 8 (eight) hours as needed for pain. 05/11/13   Mosie Lukes, MD   BP 146/78  Pulse 89  Temp(Src) 98.8 F (37.1 C) (Oral)  Resp 22  Ht 5\' 11"  (1.803 m)  Wt 164 lb (74.39 kg)  BMI 22.88 kg/m2  SpO2 100% Physical Exam  Nursing note and vitals reviewed. Constitutional: He is oriented to person, place, and time. He appears well-nourished.  Coughing   HENT:  Head: Normocephalic.  Mouth/Throat: Oropharynx is clear and moist.  Eyes: Conjunctivae and EOM are normal. Pupils are equal, round, and reactive to light.  Neck: Normal range of motion. Neck supple.  Cardiovascular: Normal rate, regular rhythm and normal heart sounds.   Pulmonary/Chest:  Crackles R base   Abdominal: Soft. Bowel sounds are normal. He exhibits no distension. There is no tenderness. There is no rebound.  Musculoskeletal: Normal range of motion. He exhibits no edema and no tenderness.  Neurological: He is alert and oriented to person, place, and time. No cranial nerve deficit. Coordination normal.  Skin: Skin is warm and dry.  Psychiatric: He has a normal mood and affect. His behavior is normal. Judgment and thought content normal.    ED Course  Procedures (including critical care time) Labs Review Labs Reviewed  CBC WITH DIFFERENTIAL - Abnormal; Notable for the following:    RBC 3.82 (*)     MCV 103.7 (*)    MCH 35.6 (*)    Monocytes Absolute 1.2 (*)    All other components within normal limits  COMPREHENSIVE METABOLIC PANEL - Abnormal; Notable for the following:    Glucose, Bld 102 (*)    GFR calc non Af Amer 67 (*)    GFR calc Af Amer 78 (*)    All other components within normal limits  TROPONIN I  D-DIMER, QUANTITATIVE  TROPONIN I    Imaging Review Dg Chest 2 View  06/21/2014   CLINICAL DATA:  Chest pain and palpitations  EXAM: CHEST  2 VIEW  COMPARISON:  Chest radiograph 09/25/2013  FINDINGS: Normal cardiac silhouette. Lungs are hyperinflated. The chronic bronchitic markings centrally. No infiltrate or pneumothorax. No pulmonary edema. Degenerative osteophytosis of the thoracic spine.  IMPRESSION: 1. Hyperinflated lungs. 2. No acute findings.   Electronically Signed   By: Suzy Bouchard M.D.   On: 06/21/2014 18:07     EKG Interpretation   Date/Time:  Friday June 21 2014 17:29:09 EDT Ventricular Rate:  66 PR Interval:  200 QRS Duration: 112 QT Interval:  388 QTC Calculation: 406 R Axis:   -54 Text Interpretation:  Normal sinus rhythm Left anterior fascicular block  Left ventricular hypertrophy Abnormal ECG No significant change since last  tracing Confirmed by Letisha Yera  MD, Lonzy Mato ([id number]) on 06/21/2014 5:38:23 PM      MDM   Final diagnoses:  None    Ronald Wolf is a 68 y.o. male here with SOB on exertion, palpitations. Consider ACS vs pneumonia (crackles R base). Will get trop x 2. Will get cxr and get labs and reassess.   8:48 PM Trop neg x 2. D-dimer nl. CXR showed no acute findings. I still think he has atypical pneumonia. Given zithromax. Will d/c home. Has outpatient f/u.    Wandra Arthurs, MD 06/21/14 2107

## 2014-06-21 NOTE — Assessment & Plan Note (Addendum)
Patient is in today for the first time in several months complaining of a one-week history of escalation as palpitations, shortness of breath, chest pain, epigastric pain, anorexia. 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 Due to his risk factors and is worsening symptoms we will refer him to the emergency room for more emergent workup. Due to his weight loss, epigastric pain, nausea and loss of appetite there are also concerns for intra-abdominal pathology including H. Pylori, constipation and or concerning disease. Over time would benefit from abdominal x-ray possibly CAT scanning as well as blood work to rule out H. Pyloric, pancreatic disease etc. Needs rule out for acute cardiac process

## 2014-06-21 NOTE — ED Notes (Signed)
Patient transported to X-ray 

## 2014-06-21 NOTE — ED Notes (Signed)
MD at bedside discussing plan of care.

## 2014-06-21 NOTE — Progress Notes (Signed)
Pre visit review using our clinic review tool, if applicable. No additional management support is needed unless otherwise documented below in the visit note. 

## 2014-06-25 ENCOUNTER — Telehealth: Payer: Self-pay | Admitting: Family Medicine

## 2014-06-25 NOTE — Telephone Encounter (Signed)
Spoke with pt. He is requesting results from his ER visit. Doesn't understand what they found. Advised pt per ER note they are treating him for possible atypical pneumonia. I asked pt if he is taking zpack and inhaler that was prescribed from the ER. Pt states he did not get anything. Advised pt to check his papers given to him by the ER and he found both Rxs. Advised pt to get Rxs filled and start medications. Pt has f/u on 06/28/14 with PA, Hassell Done and states he will need note to return to work at that time.

## 2014-06-25 NOTE — Telephone Encounter (Signed)
Patient is requesting last results

## 2014-06-25 NOTE — Telephone Encounter (Signed)
Thank you for making me aware of this.  I will reassess his symptoms at his visit on the 17th.

## 2014-06-28 ENCOUNTER — Encounter: Payer: Self-pay | Admitting: Physician Assistant

## 2014-06-28 ENCOUNTER — Ambulatory Visit (INDEPENDENT_AMBULATORY_CARE_PROVIDER_SITE_OTHER): Payer: Medicare Other | Admitting: Physician Assistant

## 2014-06-28 ENCOUNTER — Ambulatory Visit (HOSPITAL_BASED_OUTPATIENT_CLINIC_OR_DEPARTMENT_OTHER)
Admission: RE | Admit: 2014-06-28 | Discharge: 2014-06-28 | Disposition: A | Discharge status: Home / Self Care | Payer: Medicare Other | Source: Ambulatory Visit | Attending: Family Medicine | Admitting: Family Medicine

## 2014-06-28 VITALS — BP 150/72 | HR 101 | Temp 98.8°F | Resp 16 | Ht 71.0 in | Wt 160.2 lb

## 2014-06-28 DIAGNOSIS — K5900 Constipation, unspecified: Secondary | ICD-10-CM

## 2014-06-28 DIAGNOSIS — Z8551 Personal history of malignant neoplasm of bladder: Secondary | ICD-10-CM | POA: Insufficient documentation

## 2014-06-28 DIAGNOSIS — R079 Chest pain, unspecified: Secondary | ICD-10-CM

## 2014-06-28 DIAGNOSIS — I10 Essential (primary) hypertension: Secondary | ICD-10-CM

## 2014-06-28 DIAGNOSIS — R634 Abnormal weight loss: Secondary | ICD-10-CM | POA: Insufficient documentation

## 2014-06-28 DIAGNOSIS — R0602 Shortness of breath: Secondary | ICD-10-CM

## 2014-06-28 DIAGNOSIS — M545 Low back pain, unspecified: Secondary | ICD-10-CM

## 2014-06-28 DIAGNOSIS — J189 Pneumonia, unspecified organism: Secondary | ICD-10-CM

## 2014-06-28 DIAGNOSIS — R1013 Epigastric pain: Secondary | ICD-10-CM

## 2014-06-28 DIAGNOSIS — R002 Palpitations: Secondary | ICD-10-CM

## 2014-06-28 DIAGNOSIS — R194 Change in bowel habit: Secondary | ICD-10-CM

## 2014-06-28 DIAGNOSIS — R630 Anorexia: Secondary | ICD-10-CM | POA: Insufficient documentation

## 2014-06-28 LAB — HEPATIC FUNCTION PANEL
ALT: 27 U/L (ref 0–53)
AST: 28 U/L (ref 0–37)
Albumin: 3.8 g/dL (ref 3.5–5.2)
Alkaline Phosphatase: 74 U/L (ref 39–117)
Bilirubin, Direct: 0.2 mg/dL (ref 0.0–0.3)
Indirect Bilirubin: 0.4 mg/dL (ref 0.2–1.2)
Total Bilirubin: 0.6 mg/dL (ref 0.2–1.2)
Total Protein: 7.4 g/dL (ref 6.0–8.3)

## 2014-06-28 LAB — AMYLASE: Amylase: 83 U/L (ref 0–105)

## 2014-06-28 LAB — RENAL FUNCTION PANEL
Albumin: 3.8 g/dL (ref 3.5–5.2)
BUN: 13 mg/dL (ref 6–23)
CO2: 26 mEq/L (ref 19–32)
Calcium: 9.9 mg/dL (ref 8.4–10.5)
Chloride: 107 mEq/L (ref 96–112)
Creat: 1 mg/dL (ref 0.50–1.35)
Glucose, Bld: 108 mg/dL — ABNORMAL HIGH (ref 70–99)
Phosphorus: 3.2 mg/dL (ref 2.3–4.6)
Potassium: 4.3 mEq/L (ref 3.5–5.3)
Sodium: 142 mEq/L (ref 135–145)

## 2014-06-28 LAB — CBC WITH DIFFERENTIAL/PLATELET
Basophils Absolute: 0 10*3/uL (ref 0.0–0.1)
Basophils Relative: 0 % (ref 0–1)
Eosinophils Absolute: 0.1 10*3/uL (ref 0.0–0.7)
Eosinophils Relative: 2 % (ref 0–5)
HCT: 42.1 % (ref 39.0–52.0)
Hemoglobin: 14.2 g/dL (ref 13.0–17.0)
Lymphocytes Relative: 38 % (ref 12–46)
Lymphs Abs: 2.4 10*3/uL (ref 0.7–4.0)
MCH: 34.5 pg — ABNORMAL HIGH (ref 26.0–34.0)
MCHC: 33.7 g/dL (ref 30.0–36.0)
MCV: 102.4 fL — ABNORMAL HIGH (ref 78.0–100.0)
Monocytes Absolute: 0.8 10*3/uL (ref 0.1–1.0)
Monocytes Relative: 13 % — ABNORMAL HIGH (ref 3–12)
Neutro Abs: 2.9 10*3/uL (ref 1.7–7.7)
Neutrophils Relative %: 47 % (ref 43–77)
Platelets: 287 10*3/uL (ref 150–400)
RBC: 4.11 MIL/uL — ABNORMAL LOW (ref 4.22–5.81)
RDW: 12.8 % (ref 11.5–15.5)
WBC: 6.2 10*3/uL (ref 4.0–10.5)

## 2014-06-28 LAB — LIPASE: Lipase: 61 U/L (ref 0–75)

## 2014-06-28 LAB — TSH: TSH: 1.394 u[IU]/mL (ref 0.350–4.500)

## 2014-06-28 LAB — SEDIMENTATION RATE: Sed Rate: 40 mm/h — ABNORMAL HIGH (ref 0–16)

## 2014-06-28 MED ORDER — AMLODIPINE BESYLATE 5 MG PO TABS: ORAL_TABLET | ORAL | Status: DC

## 2014-06-28 MED ORDER — TRAMADOL HCL 50 MG PO TABS: 50.0000 mg | ORAL_TABLET | Freq: Three times a day (TID) | ORAL | Status: DC | PRN

## 2014-06-28 NOTE — Patient Instructions (Addendum)
Finish antibiotic.  Stay well hydrated.  Continue multivitamin.  Use Delsym for cough.  I am glad you are feeling better.  The pneumonia seems to be resolving with medication.  We will recheck labs today.   For anorexia and weight changes, please obtain the labs we have ordered.  Then go to the first floor and obtain imaging.  I will call you with all of your results and we will proceed from there.    You will also be contacted by a Cardiologist due to your recent abnormal EKGs.

## 2014-06-28 NOTE — Progress Notes (Signed)
Pre visit review using our clinic review tool, if applicable. No additional management support is needed unless otherwise documented below in the visit note/SLS  

## 2014-07-01 ENCOUNTER — Telehealth: Payer: Self-pay | Admitting: *Deleted

## 2014-07-01 DIAGNOSIS — R630 Anorexia: Secondary | ICD-10-CM

## 2014-07-01 DIAGNOSIS — R700 Elevated erythrocyte sedimentation rate: Secondary | ICD-10-CM

## 2014-07-01 LAB — H. PYLORI ANTIBODY, IGG: H Pylori IgG: 0.78 {ISR}

## 2014-07-01 NOTE — Telephone Encounter (Signed)
Message copied by Rockwell Germany on Mon Jul 01, 2014  5:20 PM ------      Message from: Raiford Noble      Created: Mon Jul 01, 2014 12:07 PM       See previous note ------

## 2014-07-01 NOTE — Telephone Encounter (Signed)
Notes Recorded by Leeanne Rio, PA-C on 07/01/2014 at 12:07 PM Labs are unremarkable except for elevated ESR. Recommend he come back in for repeat ESR, hs-CRP, ANA and RF. Needs to schedule follow-up with Dr. Charlett Blake in 2 weeks. I would like to send him to GI for evaluation of anorexia. Please see if patient is willing to let me do this.  Patient informed, understood & agreed; please proceed with GI Referral, future orders placed/SLS

## 2014-07-02 NOTE — Telephone Encounter (Signed)
Referral placed.

## 2014-07-03 ENCOUNTER — Encounter: Payer: Self-pay | Admitting: Gastroenterology

## 2014-07-07 DIAGNOSIS — J189 Pneumonia, unspecified organism: Secondary | ICD-10-CM | POA: Insufficient documentation

## 2014-07-07 NOTE — Progress Notes (Signed)
Patient presents to clinic today for ER follow-up of CAP.  Patient seen in ER on 06/21/14 for cough, shortness of breath and fatigue.  CXR revealed hyperinflated lungs but no acute abnormality.  ER physician still with concern for CAP as patient was dehydrated.  Patient given Rx for Azithromycin, albuterol and cough medication..  Since discharge patient states he was unaware he was supposed to pick up a prescription for an antibiotic until he spoke with a CMA in our office who reiterated ER instructions to patient.  Is currently taking Azithromycin.  Still has several days of medication left.  Endorses continued fatigue but improved breathing and diminished cough.  Denies fever, chills, pleuritic chest pain.  Denies new symptoms.   Patient also needs completion of workup started at last visit with PCP.  Patient had complained of anorexia and weight loss over the past several months.  See previous clinic note from Penni Homans, MD.  Patient was triaged to ER due to abnormal EKG.  Patient has not obtained blood work ordered by MD.  Past Medical History  Diagnosis Date  . Depression   . Eating disorder   . Hepatitis C   . Hypertension   . Lesion of liver 04/01/2013  . Atherosclerosis of arteries 05/13/2013  . Medial knee pain 09/26/2013  . Abdominal pain, other specified site 09/26/2013  . Cough 09/26/2013  . Palpitations 06/21/2014  . Loss of weight 06/21/2014    Current Outpatient Prescriptions on File Prior to Visit  Medication Sig Dispense Refill  . albuterol (PROVENTIL HFA;VENTOLIN HFA) 108 (90 BASE) MCG/ACT inhaler Inhale 1-2 puffs into the lungs every 6 (six) hours as needed for wheezing or shortness of breath.  1 Inhaler  0  . azithromycin (ZITHROMAX) 250 MG tablet Take 1 tablet (250 mg total) by mouth daily.  5 tablet  0  . diclofenac sodium (VOLTAREN) 1 % GEL Apply 4 g topically 4 (four) times daily as needed. Max 32gm per day.  5 Tube  1  . fish oil-omega-3 fatty acids 1000 MG capsule Take  2 g by mouth daily.       No current facility-administered medications on file prior to visit.    Allergies  Allergen Reactions  . Iohexol      Desc: pt broke out in hives years ago from iv contrast. he was given 74m benadryl po, 1 hr prior to ct and did fine today w/o complications.  JB     Family History  Problem Relation Age of Onset  . Prostate cancer Neg Hx   . Colon cancer Neg Hx   . Breast cancer Neg Hx   . Heart disease Neg Hx   . Diabetes Mother   . Hypertension Neg Hx   . Healthy Sister     x 2  . Cancer Brother     stomach    History   Social History  . Marital Status: Single    Spouse Name: N/A    Number of Children: N/A  . Years of Education: N/A   Social History Main Topics  . Smoking status: Current Every Day Smoker -- 1.00 packs/day  . Smokeless tobacco: Never Used  . Alcohol Use: Yes  . Drug Use: Yes    Special: Marijuana  . Sexual Activity: None   Other Topics Concern  . None   Social History Narrative  . None    Review of Systems - See HPI.  All other ROS are negative.  BP 150/72  Pulse  101  Temp(Src) 98.8 F (37.1 C) (Oral)  Resp 16  Ht _0  (1.803 m)  Wt 160 lb 4 oz (72.689 kg)  BMI 22.36 kg/m2  SpO2 99%  Physical Exam  Constitutional: He is oriented to person, place, and time and well-developed, well-nourished, and in no distress.  HENT:  Head: Normocephalic and atraumatic.  Right Ear: External ear normal.  Left Ear: External ear normal.  Nose: Nose normal.  Mouth/Throat: Oropharynx is clear and moist. No oropharyngeal exudate.  Eyes: Conjunctivae are normal. Pupils are equal, round, and reactive to light.  Neck: Neck supple.  Cardiovascular: Normal rate, regular rhythm, normal heart sounds and intact distal pulses.   Pulmonary/Chest: Effort normal and breath sounds normal. No respiratory distress. He has no wheezes. He has no rales. He exhibits no tenderness.  Abdominal: Soft. Bowel sounds are normal. He exhibits no  distension and no mass. There is no tenderness. There is no rebound and no guarding.  Lymphadenopathy:    He has no cervical adenopathy.  Neurological: He is alert and oriented to person, place, and time.  Skin: Skin is warm and dry. No rash noted.  Psychiatric: Affect normal.    Recent Results (from the past 2160 hour(s))  CBC WITH DIFFERENTIAL     Status: Abnormal   Collection Time    06/21/14  5:37 PM      Result Value Ref Range   WBC 9.5  4.0 - 10.5 K/uL   RBC 3.82 (*) 4.22 - 5.81 MIL/uL   Hemoglobin 13.6  13.0 - 17.0 g/dL   HCT 39.6  39.0 - 52.0 %   MCV 103.7 (*) 78.0 - 100.0 fL   MCH 35.6 (*) 26.0 - 34.0 pg   MCHC 34.3  30.0 - 36.0 g/dL   RDW 12.4  11.5 - 15.5 %   Platelets 214  150 - 400 K/uL   Neutrophils Relative % 45  43 - 77 %   Neutro Abs 4.3  1.7 - 7.7 K/uL   Lymphocytes Relative 42  12 - 46 %   Lymphs Abs 4.0  0.7 - 4.0 K/uL   Monocytes Relative 12  3 - 12 %   Monocytes Absolute 1.2 (*) 0.1 - 1.0 K/uL   Eosinophils Relative 1  0 - 5 %   Eosinophils Absolute 0.1  0.0 - 0.7 K/uL   Basophils Relative 0  0 - 1 %   Basophils Absolute 0.0  0.0 - 0.1 K/uL  COMPREHENSIVE METABOLIC PANEL     Status: Abnormal   Collection Time    06/21/14  5:37 PM      Result Value Ref Range   Sodium 141  137 - 147 mEq/L   Potassium 4.2  3.7 - 5.3 mEq/L   Chloride 104  96 - 112 mEq/L   CO2 24  19 - 32 mEq/L   Glucose, Bld 102 (*) 70 - 99 mg/dL   BUN 15  6 - 23 mg/dL   Creatinine, Ser 1.10  0.50 - 1.35 mg/dL   Calcium 10.0  8.4 - 10.5 mg/dL   Total Protein 7.8  6.0 - 8.3 g/dL   Albumin 3.8  3.5 - 5.2 g/dL   AST 32  0 - 37 U/L   ALT 33  0 - 53 U/L   Alkaline Phosphatase 75  39 - 117 U/L   Total Bilirubin 0.6  0.3 - 1.2 mg/dL   GFR calc non Af Amer 67 (*) >90 mL/min   GFR calc Af  Amer 78 (*) >90 mL/min   Comment: (NOTE)     The eGFR has been calculated using the CKD EPI equation.     This calculation has not been validated in all clinical situations.     eGFR's persistently <90  mL/min signify possible Chronic Kidney     Disease.   Anion gap 13  5 - 15  TROPONIN I     Status: None   Collection Time    06/21/14  5:37 PM      Result Value Ref Range   Troponin I <0.30  <0.30 ng/mL   Comment:            Due to the release kinetics of cTnI,     a negative result within the first hours     of the onset of symptoms does not rule out     myocardial infarction with certainty.     If myocardial infarction is still suspected,     repeat the test at appropriate intervals.  D-DIMER, QUANTITATIVE     Status: None   Collection Time    06/21/14  6:12 PM      Result Value Ref Range   D-Dimer, Quant 0.48  0.00 - 0.48 ug/mL-FEU   Comment:            AT THE INHOUSE ESTABLISHED CUTOFF     VALUE OF 0.48 ug/mL FEU,     THIS ASSAY HAS BEEN DOCUMENTED     IN THE LITERATURE TO HAVE     A SENSITIVITY AND NEGATIVE     PREDICTIVE VALUE OF AT LEAST     98 TO 99%.  THE TEST RESULT     SHOULD BE CORRELATED WITH     AN ASSESSMENT OF THE CLINICAL     PROBABILITY OF DVT / VTE.  TROPONIN I     Status: None   Collection Time    06/21/14  8:16 PM      Result Value Ref Range   Troponin I <0.30  <0.30 ng/mL   Comment:            Due to the release kinetics of cTnI,     a negative result within the first hours     of the onset of symptoms does not rule out     myocardial infarction with certainty.     If myocardial infarction is still suspected,     repeat the test at appropriate intervals.  H. PYLORI ANTIBODY, IGG     Status: None   Collection Time    06/28/14 11:22 AM      Result Value Ref Range   H Pylori IgG 0.78     Comment: No significant level of IgG antibody to H. pylori detected.              ISR = Immune Status Ratio                  <0.90         ISR       Negative                  0.90 - 1.09   ISR       Equivocal                  >=1.10        ISR       Positive           The  above results were obtained with the Immulite 2000 H. pylori IgG     EIA.  Results  obtained from other manufacturer's assay methods may not     be used interchangeably.        RENAL FUNCTION PANEL     Status: Abnormal   Collection Time    06/28/14 11:22 AM      Result Value Ref Range   Sodium 142  135 - 145 mEq/L   Potassium 4.3  3.5 - 5.3 mEq/L   Chloride 107  96 - 112 mEq/L   CO2 26  19 - 32 mEq/L   Glucose, Bld 108 (*) 70 - 99 mg/dL   BUN 13  6 - 23 mg/dL   Creat 1.00  0.50 - 1.35 mg/dL   Albumin 3.8  3.5 - 5.2 g/dL   Calcium 9.9  8.4 - 10.5 mg/dL   Phosphorus 3.2  2.3 - 4.6 mg/dL  HEPATIC FUNCTION PANEL     Status: None   Collection Time    06/28/14 11:22 AM      Result Value Ref Range   Total Bilirubin 0.6  0.2 - 1.2 mg/dL   Bilirubin, Direct 0.2  0.0 - 0.3 mg/dL   Indirect Bilirubin 0.4  0.2 - 1.2 mg/dL   Alkaline Phosphatase 74  39 - 117 U/L   AST 28  0 - 37 U/L   ALT 27  0 - 53 U/L   Total Protein 7.4  6.0 - 8.3 g/dL   Albumin 3.8  3.5 - 5.2 g/dL  TSH     Status: None   Collection Time    06/28/14 11:22 AM      Result Value Ref Range   TSH 1.394  0.350 - 4.500 uIU/mL  CBC WITH DIFFERENTIAL     Status: Abnormal   Collection Time    06/28/14 11:22 AM      Result Value Ref Range   WBC 6.2  4.0 - 10.5 K/uL   RBC 4.11 (*) 4.22 - 5.81 MIL/uL   Hemoglobin 14.2  13.0 - 17.0 g/dL   HCT 42.1  39.0 - 52.0 %   MCV 102.4 (*) 78.0 - 100.0 fL   MCH 34.5 (*) 26.0 - 34.0 pg   MCHC 33.7  30.0 - 36.0 g/dL   RDW 12.8  11.5 - 15.5 %   Platelets 287  150 - 400 K/uL   Neutrophils Relative % 47  43 - 77 %   Neutro Abs 2.9  1.7 - 7.7 K/uL   Lymphocytes Relative 38  12 - 46 %   Lymphs Abs 2.4  0.7 - 4.0 K/uL   Monocytes Relative 13 (*) 3 - 12 %   Monocytes Absolute 0.8  0.1 - 1.0 K/uL   Eosinophils Relative 2  0 - 5 %   Eosinophils Absolute 0.1  0.0 - 0.7 K/uL   Basophils Relative 0  0 - 1 %   Basophils Absolute 0.0  0.0 - 0.1 K/uL   Smear Review Criteria for review not met    SEDIMENTATION RATE     Status: Abnormal   Collection Time    06/28/14 11:22 AM       Result Value Ref Range   Sed Rate 40 (*) 0 - 16 mm/hr  AMYLASE     Status: None   Collection Time    06/28/14 11:22 AM      Result Value Ref Range   Amylase 83  0 - 105 U/L  LIPASE     Status: None   Collection Time    06/28/14 11:22 AM      Result Value Ref Range   Lipase 61  0 - 75 U/L    Assessment/Plan: CAP (community acquired pneumonia) Diagnosed based on clinical assessment in ER.  X-ray negative for noted consolidation.  Patient to continue Azithromycin until all tablets are gone.  Increase fluids.  Rest.  Continue albuterol if needed for chest tightness. Plain Mucinex.  Probiotic.  Humidifier in bedroom.  Return precautions discussed with patient.   Loss of weight Please obtain labs previously ordered.  Will obtain imaging.  Will refer to GI for further evaluation and colonoscopy.

## 2014-07-07 NOTE — Assessment & Plan Note (Signed)
Diagnosed based on clinical assessment in ER.  X-ray negative for noted consolidation.  Patient to continue Azithromycin until all tablets are gone.  Increase fluids.  Rest.  Continue albuterol if needed for chest tightness. Plain Mucinex.  Probiotic.  Humidifier in bedroom.  Return precautions discussed with patient.

## 2014-07-07 NOTE — Assessment & Plan Note (Signed)
Please obtain labs previously ordered.  Will obtain imaging.  Will refer to GI for further evaluation and colonoscopy.

## 2014-07-15 ENCOUNTER — Ambulatory Visit: Payer: Medicare Other | Admitting: Gastroenterology

## 2014-07-23 ENCOUNTER — Encounter: Payer: Self-pay | Admitting: Family Medicine

## 2014-07-23 ENCOUNTER — Ambulatory Visit (INDEPENDENT_AMBULATORY_CARE_PROVIDER_SITE_OTHER): Payer: Medicare Other | Admitting: Family Medicine

## 2014-07-23 VITALS — BP 152/70 | HR 80 | Temp 98.9°F | Ht 71.0 in | Wt 171.0 lb

## 2014-07-23 DIAGNOSIS — I10 Essential (primary) hypertension: Secondary | ICD-10-CM

## 2014-07-23 DIAGNOSIS — F32A Depression, unspecified: Secondary | ICD-10-CM

## 2014-07-23 DIAGNOSIS — F419 Anxiety disorder, unspecified: Principal | ICD-10-CM

## 2014-07-23 DIAGNOSIS — R52 Pain, unspecified: Secondary | ICD-10-CM

## 2014-07-23 DIAGNOSIS — F17200 Nicotine dependence, unspecified, uncomplicated: Secondary | ICD-10-CM

## 2014-07-23 DIAGNOSIS — F329 Major depressive disorder, single episode, unspecified: Secondary | ICD-10-CM

## 2014-07-23 DIAGNOSIS — F341 Dysthymic disorder: Secondary | ICD-10-CM

## 2014-07-23 DIAGNOSIS — I739 Peripheral vascular disease, unspecified: Secondary | ICD-10-CM

## 2014-07-23 MED ORDER — PREDNISONE 20 MG PO TABS: ORAL_TABLET | ORAL | Status: DC

## 2014-07-23 MED ORDER — PREDNISONE 5 MG PO TABS: ORAL_TABLET | ORAL | Status: DC

## 2014-07-23 MED ORDER — BUPROPION HCL ER (XL) 150 MG PO TB24: 150.0000 mg | ORAL_TABLET | Freq: Every day | ORAL | Status: DC

## 2014-07-23 MED ORDER — BUPROPION HCL ER (XL) 300 MG PO TB24: 300.0000 mg | ORAL_TABLET | Freq: Every day | ORAL | Status: DC

## 2014-07-23 NOTE — Progress Notes (Signed)
Pre visit review using our clinic review tool, if applicable. No additional management support is needed unless otherwise documented below in the visit note. 

## 2014-07-24 ENCOUNTER — Telehealth: Payer: Self-pay | Admitting: Family Medicine

## 2014-07-24 NOTE — Telephone Encounter (Signed)
Relevant patient education mailed to patient.  

## 2014-07-28 ENCOUNTER — Encounter: Payer: Self-pay | Admitting: Family Medicine

## 2014-07-28 DIAGNOSIS — R52 Pain, unspecified: Secondary | ICD-10-CM | POA: Insufficient documentation

## 2014-07-28 DIAGNOSIS — I739 Peripheral vascular disease, unspecified: Secondary | ICD-10-CM | POA: Insufficient documentation

## 2014-07-28 DIAGNOSIS — F329 Major depressive disorder, single episode, unspecified: Secondary | ICD-10-CM | POA: Insufficient documentation

## 2014-07-28 DIAGNOSIS — F32A Depression, unspecified: Secondary | ICD-10-CM | POA: Insufficient documentation

## 2014-07-28 DIAGNOSIS — F419 Anxiety disorder, unspecified: Principal | ICD-10-CM

## 2014-07-28 NOTE — Assessment & Plan Note (Signed)
Is c/o back pain has been seeing chiropractor and he is struggling with unsteady gait and fatigue, consider further referral if syptoms worsen

## 2014-07-28 NOTE — Assessment & Plan Note (Signed)
conitnues to smoke 1/2 ppd. Encouraged complete cessation. Discussed need to quit as relates to risk of numerous cancers, cardiac and pulmonary disease as well as neurologic complications. Counseled for greater than 3 minutes

## 2014-07-28 NOTE — Assessment & Plan Note (Signed)
Mild elevation but improved with recheck

## 2014-07-28 NOTE — Assessment & Plan Note (Signed)
He declines referral to vascular today and has been seen in past, will proceed with ABI and reassess at next visit, encouraged smoking cessation

## 2014-07-28 NOTE — Progress Notes (Signed)
Patient ID: Ronald Wolf, male   DOB: [birth year], 68 y.o.   MRN: [id number] Ronald Wolf [id number] 1946/09/08 07/28/2014      Progress Note-Follow Up  Subjective  Chief Complaint  Chief Complaint  Patient presents with  . Follow-up    HPI  Patient is a 68 year old male in today for routine medical care. She is noting improvement in his respiratory symptoms. His cough and congestion are somewhat improved although he does still smoke half pack per day and often clear clear phlegm in the mornings. No fevers or chills. He continues to complain of unsteady gait and weakness. No falls but he has trouble walking a straight line frequently. Denies any headaches or other concerning acute symptoms. Does still acknowledge fatigue and anorexia. Denies CP/palp/SOB/HA/congestion/fevers/GI or GU c/o. Taking meds as prescribed  Past Medical History  Diagnosis Date  . Depression   . Eating disorder   . Hepatitis C   . Hypertension   . Lesion of liver 04/01/2013  . Atherosclerosis of arteries 05/13/2013  . Medial knee pain 09/26/2013  . Abdominal pain, other specified site 09/26/2013  . Cough 09/26/2013  . Palpitations 06/21/2014  . Loss of weight 06/21/2014    Past Surgical History  Procedure Laterality Date  . 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 Prostate cancer Neg Hx   . Colon cancer Neg Hx   . Breast cancer Neg Hx   . Heart disease Neg Hx   . Diabetes Mother   . Hypertension Neg Hx   . Healthy Sister     x 2  . Cancer Brother     stomach    History   Social History  . Marital Status: Single    Spouse Name: N/A    Number of Children: N/A  . Years of Education: N/A   Occupational History  . Not on file.   Social History Main Topics  . Smoking status: Current Every Day Smoker -- 1.00 packs/day  . Smokeless tobacco: Never Used  . Alcohol Use: Yes  . Drug Use: Yes    Special: Marijuana  . Sexual Activity: Not on file   Other  Topics Concern  . Not on file   Social History Narrative  . No narrative on file    Current Outpatient Prescriptions on File Prior to Visit  Medication Sig Dispense Refill  . albuterol (PROVENTIL HFA;VENTOLIN HFA) 108 (90 BASE) MCG/ACT inhaler Inhale 1-2 puffs into the lungs every 6 (six) hours as needed for wheezing or shortness of breath.  1 Inhaler  0  . amLODipine (NORVASC) 5 MG tablet take 1 tablet by mouth once daily  30 tablet  5  . carbamazepine (TEGRETOL) 100 MG chewable tablet Chew 200 mg by mouth 2 (two) times daily as needed.      . diclofenac sodium (VOLTAREN) 1 % GEL Apply 4 g topically 4 (four) times daily as needed. Max 32gm per day.  5 Tube  1  . fish oil-omega-3 fatty acids 1000 MG capsule Take 2 g by mouth daily.      . traMADol (ULTRAM) 50 MG tablet Take 1 tablet (50 mg total) by mouth every 8 (eight) hours as needed.  60 tablet  1   No current facility-administered medications on file prior to visit.    Allergies  Allergen Reactions  . Iohexol      Desc: pt broke out in hives  years ago from iv contrast. he was given 50mg  benadryl po, 1 hr prior to ct and did fine today w/o complications.  JB     Review of Systems  Review of Systems  Constitutional: Positive for malaise/fatigue. Negative for fever.  HENT: Positive for congestion.   Eyes: Positive for pain. Negative for discharge.  Respiratory: Positive for sputum production and shortness of breath.   Cardiovascular: Negative for chest pain, palpitations and leg swelling.  Gastrointestinal: Negative for nausea, abdominal pain and diarrhea.  Genitourinary: Negative for dysuria.  Musculoskeletal: Positive for back pain and myalgias. Negative for falls.  Skin: Negative for rash.  Neurological: Positive for dizziness. Negative for loss of consciousness and headaches.  Endo/Heme/Allergies: Negative for polydipsia.  Psychiatric/Behavioral: Negative for depression and suicidal ideas. The patient is not  nervous/anxious and does not have insomnia.     Objective  BP 152/70  Pulse 80  Temp(Src) 98.9 F (37.2 C) (Oral)  Ht 5\' 11"  (1.803 m)  Wt 171 lb 0.6 oz (77.583 kg)  BMI 23.87 kg/m2  SpO2 98%  Physical Exam  Physical Exam  Constitutional: He is oriented to person, place, and time and well-developed, well-nourished, and in no distress. No distress.  HENT:  Head: Normocephalic and atraumatic.  Eyes: Conjunctivae are normal.  Neck: Neck supple. No thyromegaly present.  Cardiovascular: Normal rate, regular rhythm and normal heart sounds.   No murmur heard. Pulmonary/Chest: Effort normal and breath sounds normal. No respiratory distress.  Abdominal: He exhibits no distension and no mass. There is no tenderness.  Musculoskeletal: He exhibits no edema.  Neurological: He is alert and oriented to person, place, and time.  Skin: Skin is warm.  Psychiatric: Memory, affect and judgment normal.    Lab Results  Component Value Date   TSH 1.394 06/28/2014   Lab Results  Component Value Date   WBC 6.2 06/28/2014   HGB 14.2 06/28/2014   HCT 42.1 06/28/2014   MCV 102.4* 06/28/2014   PLT 287 06/28/2014   Lab Results  Component Value Date   CREATININE 1.00 06/28/2014   BUN 13 06/28/2014   NA 142 06/28/2014   K 4.3 06/28/2014   CL 107 06/28/2014   CO2 26 06/28/2014   Lab Results  Component Value Date   ALT 27 06/28/2014   AST 28 06/28/2014   ALKPHOS 74 06/28/2014   BILITOT 0.6 06/28/2014     Assessment & Plan  HTN (hypertension) Mild elevation but improved with recheck  PVD (peripheral vascular disease) He declines referral to vascular today and has been seen in past, will proceed with ABI and reassess at next visit, encouraged smoking cessation  Smoking conitnues to smoke 1/2 ppd. Encouraged complete cessation. Discussed need to quit as relates to risk of numerous cancers, cardiac and pulmonary disease as well as neurologic complications. Counseled for greater than 3  minutes  Pain Is c/o back pain has been seeing chiropractor and he is struggling with unsteady gait and fatigue, consider further referral if syptoms worsen

## 2014-07-30 ENCOUNTER — Encounter (HOSPITAL_COMMUNITY): Payer: Medicare Other

## 2014-09-11 ENCOUNTER — Ambulatory Visit: Payer: Medicare Other | Admitting: Gastroenterology

## 2014-10-23 ENCOUNTER — Encounter: Payer: Self-pay | Admitting: Medical

## 2014-10-23 ENCOUNTER — Ambulatory Visit (INDEPENDENT_AMBULATORY_CARE_PROVIDER_SITE_OTHER): Payer: Medicare Other | Admitting: Medical

## 2014-10-23 VITALS — BP 160/80 | HR 76 | Temp 98.3°F | Ht 71.0 in | Wt 171.4 lb

## 2014-10-23 DIAGNOSIS — Z23 Encounter for immunization: Secondary | ICD-10-CM

## 2014-10-23 DIAGNOSIS — I2510 Atherosclerotic heart disease of native coronary artery without angina pectoris: Secondary | ICD-10-CM

## 2014-10-23 DIAGNOSIS — Z720 Tobacco use: Secondary | ICD-10-CM

## 2014-10-23 DIAGNOSIS — R739 Hyperglycemia, unspecified: Secondary | ICD-10-CM

## 2014-10-23 DIAGNOSIS — F17200 Nicotine dependence, unspecified, uncomplicated: Secondary | ICD-10-CM

## 2014-10-23 DIAGNOSIS — Z0000 Encounter for general adult medical examination without abnormal findings: Secondary | ICD-10-CM

## 2014-10-23 DIAGNOSIS — R319 Hematuria, unspecified: Secondary | ICD-10-CM

## 2014-10-23 DIAGNOSIS — R82998 Other abnormal findings in urine: Secondary | ICD-10-CM | POA: Insufficient documentation

## 2014-10-23 DIAGNOSIS — R94120 Abnormal auditory function study: Secondary | ICD-10-CM

## 2014-10-23 DIAGNOSIS — N390 Urinary tract infection, site not specified: Secondary | ICD-10-CM

## 2014-10-23 DIAGNOSIS — R5383 Other fatigue: Secondary | ICD-10-CM

## 2014-10-23 DIAGNOSIS — Z431 Encounter for attention to gastrostomy: Secondary | ICD-10-CM | POA: Insufficient documentation

## 2014-10-23 DIAGNOSIS — Z1211 Encounter for screening for malignant neoplasm of colon: Secondary | ICD-10-CM

## 2014-10-23 LAB — POCT URINALYSIS DIPSTICK
Glucose, UA: NEGATIVE
Ketones, UA: NEGATIVE
Nitrite, UA: POSITIVE
Protein, UA: 300
Spec Grav, UA: 1.01
Urobilinogen, UA: 1
pH, UA: 7

## 2014-10-23 MED ORDER — ZOSTER VACCINE LIVE 19400 UNT/0.65ML ~~LOC~~ SOLR: 0.6500 mL | Freq: Once | SUBCUTANEOUS | Status: DC

## 2014-10-23 MED ORDER — CIPROFLOXACIN HCL 500 MG PO TABS: 500.0000 mg | ORAL_TABLET | Freq: Two times a day (BID) | ORAL | Status: DC

## 2014-10-23 NOTE — Patient Instructions (Addendum)
We gave you flu vaccine and pneumo vaccine today. I will send zosta vaccine to your pharmacy. I am putting in future order for fasting lipid panel. Check you blood pressure daily and if above 140/90 then take amlodipine 10 mg q day.  Preventive Care for Adults A healthy lifestyle and preventive care can promote health and wellness. Preventive health guidelines for men include the following key practices:  A routine yearly physical is a good way to check with your health care provider about your health and preventative screening. It is a chance to share any concerns and updates on your health and to receive a thorough exam.  Visit your dentist for a routine exam and preventative care every 6 months. Brush your teeth twice a day and floss once a day. Good oral hygiene prevents tooth decay and gum disease.  The frequency of eye exams is based on your age, health, family medical history, use of contact lenses, and other factors. Follow your health care provider's recommendations for frequency of eye exams.  Eat a healthy diet. Foods such as vegetables, fruits, whole grains, low-fat dairy products, and lean protein foods contain the nutrients you need without too many calories. Decrease your intake of foods high in solid fats, added sugars, and salt. Eat the right amount of calories for you.Get information about a proper diet from your health care provider, if necessary.  Regular physical exercise is one of the most important things you can do for your health. Most adults should get at least 150 minutes of moderate-intensity exercise (any activity that increases your heart rate and causes you to sweat) each week. In addition, most adults need muscle-strengthening exercises on 2 or more days a week.  Maintain a healthy weight. The body mass index (BMI) is a screening tool to identify possible weight problems. It provides an estimate of body fat based on height and weight. Your health care provider can find  your BMI and can help you achieve or maintain a healthy weight.For adults 20 years and older:  A BMI below 18.5 is considered underweight.  A BMI of 18.5 to 24.9 is normal.  A BMI of 25 to 29.9 is considered overweight.  A BMI of 30 and above is considered obese.  Maintain normal blood lipids and cholesterol levels by exercising and minimizing your intake of saturated fat. Eat a balanced diet with plenty of fruit and vegetables. Blood tests for lipids and cholesterol should begin at age 25 and be repeated every 5 years. If your lipid or cholesterol levels are high, you are over 50, or you are at high risk for heart disease, you may need your cholesterol levels checked more frequently.Ongoing high lipid and cholesterol levels should be treated with medicines if diet and exercise are not working.  If you smoke, find out from your health care provider how to quit. If you do not use tobacco, do not start.  Lung cancer screening is recommended for adults aged 62-80 years who are at high risk for developing lung cancer because of a history of smoking. A yearly low-dose CT scan of the lungs is recommended for people who have at least a 30-pack-year history of smoking and are a current smoker or have quit within the past 15 years. A pack year of smoking is smoking an average of 1 pack of cigarettes a day for 1 year (for example: 1 pack a day for 30 years or 2 packs a day for 15 years). Yearly screening should continue  until the smoker has stopped smoking for at least 15 years. Yearly screening should be stopped for people who develop a health problem that would prevent them from having lung cancer treatment.  If you choose to drink alcohol, do not have more than 2 drinks per day. One drink is considered to be 12 ounces (355 mL) of beer, 5 ounces (148 mL) of wine, or 1.5 ounces (44 mL) of liquor.  Avoid use of street drugs. Do not share needles with anyone. Ask for help if you need support or instructions  about stopping the use of drugs.  High blood pressure causes heart disease and increases the risk of stroke. Your blood pressure should be checked at least every 1-2 years. Ongoing high blood pressure should be treated with medicines, if weight loss and exercise are not effective.  If you are 62-84 years old, ask your health care provider if you should take aspirin to prevent heart disease.  Diabetes screening involves taking a blood sample to check your fasting blood sugar level. This should be done once every 3 years, after age 80, if you are within normal weight and without risk factors for diabetes. Testing should be considered at a younger age or be carried out more frequently if you are overweight and have at least 1 risk factor for diabetes.  Colorectal cancer can be detected and often prevented. Most routine colorectal cancer screening begins at the age of 30 and continues through age 66. However, your health care provider may recommend screening at an earlier age if you have risk factors for colon cancer. On a yearly basis, your health care provider may provide home test kits to check for hidden blood in the stool. Use of a small camera at the end of a tube to directly examine the colon (sigmoidoscopy or colonoscopy) can detect the earliest forms of colorectal cancer. Talk to your health care provider about this at age 26, when routine screening begins. Direct exam of the colon should be repeated every 5-10 years through age 54, unless early forms of precancerous polyps or small growths are found.  People who are at an increased risk for hepatitis B should be screened for this virus. You are considered at high risk for hepatitis B if:  You were born in a country where hepatitis B occurs often. Talk with your health care provider about which countries are considered high risk.  Your parents were born in a high-risk country and you have not received a shot to protect against hepatitis B  (hepatitis B vaccine).  You have HIV or AIDS.  You use needles to inject street drugs.  You live with, or have sex with, someone who has hepatitis B.  You are a man who has sex with other men (MSM).  You get hemodialysis treatment.  You take certain medicines for conditions such as cancer, organ transplantation, and autoimmune conditions.  Hepatitis C blood testing is recommended for all people born from [birth year] through 1965 and any individual with known risks for hepatitis C.  Practice safe sex. Use condoms and avoid high-risk sexual practices to reduce the spread of sexually transmitted infections (STIs). STIs include gonorrhea, chlamydia, syphilis, trichomonas, herpes, HPV, and human immunodeficiency virus (HIV). Herpes, HIV, and HPV are viral illnesses that have no cure. They can result in disability, cancer, and death.  If you are at risk of being infected with HIV, it is recommended that you take a prescription medicine daily to prevent HIV infection. This is  called preexposure prophylaxis (PrEP). You are considered at risk if:  You are a man who has sex with other men (MSM) and have other risk factors.  You are a heterosexual man, are sexually active, and are at increased risk for HIV infection.  You take drugs by injection.  You are sexually active with a partner who has HIV.  Talk with your health care provider about whether you are at high risk of being infected with HIV. If you choose to begin PrEP, you should first be tested for HIV. You should then be tested every 3 months for as long as you are taking PrEP.  A one-time screening for abdominal aortic aneurysm (AAA) and surgical repair of large AAAs by ultrasound are recommended for men ages 61 to 74 years who are current or former smokers.  Healthy men should no longer receive prostate-specific antigen (PSA) blood tests as part of routine cancer screening. Talk with your health care provider about prostate cancer  screening.  Testicular cancer screening is not recommended for adult males who have no symptoms. Screening includes self-exam, a health care provider exam, and other screening tests. Consult with your health care provider about any symptoms you have or any concerns you have about testicular cancer.  Use sunscreen. Apply sunscreen liberally and repeatedly throughout the day. You should seek shade when your shadow is shorter than you. Protect yourself by wearing long sleeves, pants, a wide-brimmed hat, and sunglasses year round, whenever you are outdoors.  Once a month, do a whole-body skin exam, using a mirror to look at the skin on your back. Tell your health care provider about new moles, moles that have irregular borders, moles that are larger than a pencil eraser, or moles that have changed in shape or color.  Stay current with required vaccines (immunizations).  Influenza vaccine. All adults should be immunized every year.  Tetanus, diphtheria, and acellular pertussis (Td, Tdap) vaccine. An adult who has not previously received Tdap or who does not know his vaccine status should receive 1 dose of Tdap. This initial dose should be followed by tetanus and diphtheria toxoids (Td) booster doses every 10 years. Adults with an unknown or incomplete history of completing a 3-dose immunization series with Td-containing vaccines should begin or complete a primary immunization series including a Tdap dose. Adults should receive a Td booster every 10 years.  Varicella vaccine. An adult without evidence of immunity to varicella should receive 2 doses or a second dose if he has previously received 1 dose.  Human papillomavirus (HPV) vaccine. Males aged 97-21 years who have not received the vaccine previously should receive the 3-dose series. Males aged 22-26 years may be immunized. Immunization is recommended through the age of 50 years for any male who has sex with males and did not get any or all doses  earlier. Immunization is recommended for any person with an immunocompromised condition through the age of 70 years if he did not get any or all doses earlier. During the 3-dose series, the second dose should be obtained 4-8 weeks after the first dose. The third dose should be obtained 24 weeks after the first dose and 16 weeks after the second dose.  Zoster vaccine. One dose is recommended for adults aged 8 years or older unless certain conditions are present.  Measles, mumps, and rubella (MMR) vaccine. Adults born before [birth year] generally are considered immune to measles and mumps. Adults born in [birth year] or later should have 1 or more doses of  MMR vaccine unless there is a contraindication to the vaccine or there is laboratory evidence of immunity to each of the three diseases. A routine second dose of MMR vaccine should be obtained at least 28 days after the first dose for students attending postsecondary schools, health care workers, or international travelers. People who received inactivated measles vaccine or an unknown type of measles vaccine during 1963-1967 should receive 2 doses of MMR vaccine. People who received inactivated mumps vaccine or an unknown type of mumps vaccine before 1979 and are at high risk for mumps infection should consider immunization with 2 doses of MMR vaccine. Unvaccinated health care workers born before [birth year] who lack laboratory evidence of measles, mumps, or rubella immunity or laboratory confirmation of disease should consider measles and mumps immunization with 2 doses of MMR vaccine or rubella immunization with 1 dose of MMR vaccine.  Pneumococcal 13-valent conjugate (PCV13) vaccine. When indicated, a person who is uncertain of his immunization history and has no record of immunization should receive the PCV13 vaccine. An adult aged 45 years or older who has certain medical conditions and has not been previously immunized should receive 1 dose of PCV13 vaccine. This PCV13  should be followed with a dose of pneumococcal polysaccharide (PPSV23) vaccine. The PPSV23 vaccine dose should be obtained at least 8 weeks after the dose of PCV13 vaccine. An adult aged 5 years or older who has certain medical conditions and previously received 1 or more doses of PPSV23 vaccine should receive 1 dose of PCV13. The PCV13 vaccine dose should be obtained 1 or more years after the last PPSV23 vaccine dose.  Pneumococcal polysaccharide (PPSV23) vaccine. When PCV13 is also indicated, PCV13 should be obtained first. All adults aged 70 years and older should be immunized. An adult younger than age 5 years who has certain medical conditions should be immunized. Any person who resides in a nursing home or long-term care facility should be immunized. An adult smoker should be immunized. People with an immunocompromised condition and certain other conditions should receive both PCV13 and PPSV23 vaccines. People with human immunodeficiency virus (HIV) infection should be immunized as soon as possible after diagnosis. Immunization during chemotherapy or radiation therapy should be avoided. Routine use of PPSV23 vaccine is not recommended for American Indians, [place] Natives, or people younger than 65 years unless there are medical conditions that require PPSV23 vaccine. When indicated, people who have unknown immunization and have no record of immunization should receive PPSV23 vaccine. One-time revaccination 5 years after the first dose of PPSV23 is recommended for people aged 19-64 years who have chronic kidney failure, nephrotic syndrome, asplenia, or immunocompromised conditions. People who received 1-2 doses of PPSV23 before age 40 years should receive another dose of PPSV23 vaccine at age 13 years or later if at least 5 years have passed since the previous dose. Doses of PPSV23 are not needed for people immunized with PPSV23 at or after age 1 years.  Meningococcal vaccine. Adults with asplenia or  persistent complement component deficiencies should receive 2 doses of quadrivalent meningococcal conjugate (MenACWY-D) vaccine. The doses should be obtained at least 2 months apart. Microbiologists working with certain meningococcal bacteria, [place] recruits, people at risk during an outbreak, and people who travel to or live in countries with a high rate of meningitis should be immunized. A first-year college student up through age 48 years who is living in a residence hall should receive a dose if he did not receive a dose on or after his  16th birthday. Adults who have certain high-risk conditions should receive one or more doses of vaccine.  Hepatitis A vaccine. Adults who wish to be protected from this disease, have certain high-risk conditions, work with hepatitis A-infected animals, work in hepatitis A research labs, or travel to or work in countries with a high rate of hepatitis A should be immunized. Adults who were previously unvaccinated and who anticipate close contact with an international adoptee during the first 60 days after arrival in the [place] from a country with a high rate of hepatitis A should be immunized.  Hepatitis B vaccine. Adults should be immunized if they wish to be protected from this disease, have certain high-risk conditions, may be exposed to blood or other infectious body fluids, are household contacts or sex partners of hepatitis B positive people, are clients or workers in certain care facilities, or travel to or work in countries with a high rate of hepatitis B.  Haemophilus influenzae type b (Hib) vaccine. A previously unvaccinated person with asplenia or sickle cell disease or having a scheduled splenectomy should receive 1 dose of Hib vaccine. Regardless of previous immunization, a recipient of a hematopoietic stem cell transplant should receive a 3-dose series 6-12 months after his successful transplant. Hib vaccine is not recommended for adults with HIV  infection. Preventive Service / Frequency Ages 45 to 60  Blood pressure check.** / Every 1 to 2 years.  Lipid and cholesterol check.** / Every 5 years beginning at age 40.  Hepatitis C blood test.** / For any individual with known risks for hepatitis C.  Skin self-exam. / Monthly.  Influenza vaccine. / Every year.  Tetanus, diphtheria, and acellular pertussis (Tdap, Td) vaccine.** / Consult your health care provider. 1 dose of Td every 10 years.  Varicella vaccine.** / Consult your health care provider.  HPV vaccine. / 3 doses over 6 months, if 76 or younger.  Measles, mumps, rubella (MMR) vaccine.** / You need at least 1 dose of MMR if you were born in 1957 or later. You may also need a second dose.  Pneumococcal 13-valent conjugate (PCV13) vaccine.** / Consult your health care provider.  Pneumococcal polysaccharide (PPSV23) vaccine.** / 1 to 2 doses if you smoke cigarettes or if you have certain conditions.  Meningococcal vaccine.** / 1 dose if you are age 58 to 75 years and a Market researcher living in a residence hall, or have one of several medical conditions. You may also need additional booster doses.  Hepatitis A vaccine.** / Consult your health care provider.  Hepatitis B vaccine.** / Consult your health care provider.  Haemophilus influenzae type b (Hib) vaccine.** / Consult your health care provider. Ages 109 to 55  Blood pressure check.** / Every 1 to 2 years.  Lipid and cholesterol check.** / Every 5 years beginning at age 36.  Lung cancer screening. / Every year if you are aged 64-80 years and have a 30-pack-year history of smoking and currently smoke or have quit within the past 15 years. Yearly screening is stopped once you have quit smoking for at least 15 years or develop a health problem that would prevent you from having lung cancer treatment.  Fecal occult blood test (FOBT) of stool. / Every year beginning at age 93 and continuing until age 50.  You may not have to do this test if you get a colonoscopy every 10 years.  Flexible sigmoidoscopy** or colonoscopy.** / Every 5 years for a flexible sigmoidoscopy or every 10 years  for a colonoscopy beginning at age 22 and continuing until age 56.  Hepatitis C blood test.** / For all people born from [birth year] through 1965 and any individual with known risks for hepatitis C.  Skin self-exam. / Monthly.  Influenza vaccine. / Every year.  Tetanus, diphtheria, and acellular pertussis (Tdap/Td) vaccine.** / Consult your health care provider. 1 dose of Td every 10 years.  Varicella vaccine.** / Consult your health care provider.  Zoster vaccine.** / 1 dose for adults aged 71 years or older.  Measles, mumps, rubella (MMR) vaccine.** / You need at least 1 dose of MMR if you were born in 1957 or later. You may also need a second dose.  Pneumococcal 13-valent conjugate (PCV13) vaccine.** / Consult your health care provider.  Pneumococcal polysaccharide (PPSV23) vaccine.** / 1 to 2 doses if you smoke cigarettes or if you have certain conditions.  Meningococcal vaccine.** / Consult your health care provider.  Hepatitis A vaccine.** / Consult your health care provider.  Hepatitis B vaccine.** / Consult your health care provider.  Haemophilus influenzae type b (Hib) vaccine.** / Consult your health care provider. Ages 67 and over  Blood pressure check.** / Every 1 to 2 years.  Lipid and cholesterol check.**/ Every 5 years beginning at age 71.  Lung cancer screening. / Every year if you are aged 52-80 years and have a 30-pack-year history of smoking and currently smoke or have quit within the past 15 years. Yearly screening is stopped once you have quit smoking for at least 15 years or develop a health problem that would prevent you from having lung cancer treatment.  Fecal occult blood test (FOBT) of stool. / Every year beginning at age 44 and continuing until age 98. You may not have to do this  test if you get a colonoscopy every 10 years.  Flexible sigmoidoscopy** or colonoscopy.** / Every 5 years for a flexible sigmoidoscopy or every 10 years for a colonoscopy beginning at age 28 and continuing until age 18.  Hepatitis C blood test.** / For all people born from [birth year] through 1965 and any individual with known risks for hepatitis C.  Abdominal aortic aneurysm (AAA) screening.** / A one-time screening for ages 74 to 34 years who are current or former smokers.  Skin self-exam. / Monthly.  Influenza vaccine. / Every year.  Tetanus, diphtheria, and acellular pertussis (Tdap/Td) vaccine.** / 1 dose of Td every 10 years.  Varicella vaccine.** / Consult your health care provider.  Zoster vaccine.** / 1 dose for adults aged 45 years or older.  Pneumococcal 13-valent conjugate (PCV13) vaccine.** / Consult your health care provider.  Pneumococcal polysaccharide (PPSV23) vaccine.** / 1 dose for all adults aged 66 years and older.  Meningococcal vaccine.** / Consult your health care provider.  Hepatitis A vaccine.** / Consult your health care provider.  Hepatitis B vaccine.** / Consult your health care provider.  Haemophilus influenzae type b (Hib) vaccine.** / Consult your health care provider. **Family history and personal history of risk and conditions may change your health care provider's recommendations. Document Released: 01/25/2002 Document Revised: 12/04/2013 Document Reviewed: 04/26/2011 [facility] Patient Information 2015 [place], [place]. This information is not intended to replace advice given to you by your health care provider. Make sure you discuss any questions you have with your health care provider. her

## 2014-10-23 NOTE — Assessment & Plan Note (Signed)
We gave you flu vaccine and pneumo vaccine today. I will send zosta vaccine to your pharmacy. I am putting in future order for fasting lipid panel. Check you blood pressure daily and if above 140/90 then take amlodipine 10 mg q day.  See smart set plan and meds and orders.

## 2014-10-23 NOTE — Progress Notes (Signed)
Pre visit review using our clinic review tool, if applicable. No additional management support is needed unless otherwise documented below in the visit note. 

## 2014-10-23 NOTE — Assessment & Plan Note (Signed)
Mild in past will get a1-c today.

## 2014-10-23 NOTE — Progress Notes (Signed)
Subjective:    Patient ID: Ronald Wolf, male    DOB: 22-Oct-1946, 68 y.o.   MRN: [id number]  HPI   Pt in for wellness exam.   Pt has some atherosclerosis. No recent lipid panel.  Pt last colonoscopy. No old report in chart. [facility] [place]([employer])  Pt does smoke now. Smoked more than 30 yrs. Pack a day. Pt states CT of chest chest done last year. I see order but don't see the report.  Abdominal US done in 11-2013. No aneursym.  Pt has history of bladder cancer. Had bladder removed.  Pt will get flu vaccine. Pneumo vaccine.  Pt will take zostavax order and consider.  Last ekg done 06-2014.  Hypertension. BP level is high. No cardiac or neurologic signs or symptom. I got 160/80. Pt states that when he checks his bp range varies. Usually when stressed it is higher. Sometimes at home 120/80. Pt not on medication today. Pt is on amlodipine.   Pt feels lousy. This is the way he has felt for months. Not new. No energy at all exhausted. No black or blood stools.  Pt did have little elevated bs on last visit.            Review of Systems  See medicare smart set. Objective:   Physical Exam  See medicare smart set.    Assessment & Plan:   Subjective:    Ronald Wolf is a 68 y.o. male who presents for Medicare Annual/Subsequent preventive examination.   Preventive Screening-Counseling & Management  Tobacco History  Smoking status  . Current Every Day Smoker -- 1.00 packs/day  Smokeless tobacco  . Never Used    Problems Prior to Visit 1. Fatigue, hyperlipidemia  Current Problems (verified) Patient Active Problem List   Diagnosis Date Noted  . PVD (peripheral vascular disease) 07/28/2014  . Pain 07/28/2014  . Anxiety and depression 07/28/2014  . CAP (community acquired pneumonia) 07/07/2014  . Palpitations 06/21/2014  . Loss of weight 06/21/2014  . Medial knee pain 09/26/2013  . Abdominal  pain, other specified site 09/26/2013    . Cough 09/26/2013  . Atherosclerosis of arteries 05/13/2013  . Smoking 05/03/2013  . Lesion of liver 04/01/2013  . Rhinitis 10/05/2012  . Iron excess 10/05/2012  . Chronic scapular pain 10/05/2012  . Chest pain 09/17/2012  . Anemia 09/17/2012  . Bone lesion 09/17/2012  . Lt facial numbness 07/19/2012  . HTN (hypertension) 06/12/2012  . Hepatitis C 05/28/2012  . Trigeminal neuralgia 05/28/2012  . Bladder cancer 05/28/2012    Medications Prior to Visit Current Outpatient Prescriptions on File Prior to Visit  Medication Sig Dispense Refill  . albuterol (PROVENTIL HFA;VENTOLIN HFA) 108 (90 BASE) MCG/ACT inhaler Inhale 1-2 puffs into the lungs every 6 (six) hours as needed for wheezing or shortness of breath. 1 Inhaler 0  . amLODipine (NORVASC) 5 MG tablet take 1 tablet by mouth once daily 30 tablet 5  . buPROPion (WELLBUTRIN XL) 150 MG 24 hr tablet Take 1 tablet (150 mg total) by mouth daily. 7 tablet 0  . buPROPion (WELLBUTRIN XL) 300 MG 24 hr tablet Take 1 tablet (300 mg total) by mouth daily. 30 tablet 2  . carbamazepine (TEGRETOL) 100 MG chewable tablet Chew 200 mg by mouth 2 (two) times daily as needed.    . diclofenac sodium (VOLTAREN) 1 % GEL Apply 4 g topically 4 (four) times daily as needed. Max 32gm per day. 5 Tube 1  . fish  oil-omega-3 fatty acids 1000 MG capsule Take 2 g by mouth daily.    . predniSONE (DELTASONE) 20 MG tablet 2 tabs po daily x 5day then 1 tab po daily x 5 day then 1/2 tab po daily x 5 day 20 tablet 0  . predniSONE (DELTASONE) 5 MG tablet Start after done with 20 mg tabs then 1 tab po daily x 5 days then 1/2 tab po daily x 5 days 10 tablet 0  . traMADol (ULTRAM) 50 MG tablet Take 1 tablet (50 mg total) by mouth every 8 (eight) hours as needed. 60 tablet 1   No current facility-administered medications on file prior to visit.    Current Medications (verified) Current Outpatient Prescriptions  Medication Sig Dispense Refill  . albuterol (PROVENTIL  HFA;VENTOLIN HFA) 108 (90 BASE) MCG/ACT inhaler Inhale 1-2 puffs into the lungs every 6 (six) hours as needed for wheezing or shortness of breath. 1 Inhaler 0  . amLODipine (NORVASC) 5 MG tablet take 1 tablet by mouth once daily 30 tablet 5  . buPROPion (WELLBUTRIN XL) 150 MG 24 hr tablet Take 1 tablet (150 mg total) by mouth daily. 7 tablet 0  . buPROPion (WELLBUTRIN XL) 300 MG 24 hr tablet Take 1 tablet (300 mg total) by mouth daily. 30 tablet 2  . carbamazepine (TEGRETOL) 100 MG chewable tablet Chew 200 mg by mouth 2 (two) times daily as needed.    . diclofenac sodium (VOLTAREN) 1 % GEL Apply 4 g topically 4 (four) times daily as needed. Max 32gm per day. 5 Tube 1  . fish oil-omega-3 fatty acids 1000 MG capsule Take 2 g by mouth daily.    . predniSONE (DELTASONE) 20 MG tablet 2 tabs po daily x 5day then 1 tab po daily x 5 day then 1/2 tab po daily x 5 day 20 tablet 0  . predniSONE (DELTASONE) 5 MG tablet Start after done with 20 mg tabs then 1 tab po daily x 5 days then 1/2 tab po daily x 5 days 10 tablet 0  . traMADol (ULTRAM) 50 MG tablet Take 1 tablet (50 mg total) by mouth every 8 (eight) hours as needed. 60 tablet 1   No current facility-administered medications for this visit.     Allergies (verified) Iohexol   PAST HISTORY  Family History Family History  Problem Relation Age of Onset  . Prostate cancer Neg Hx   . Colon cancer Neg Hx   . Breast cancer Neg Hx   . Heart disease Neg Hx   . Diabetes Mother   . Hypertension Neg Hx   . Healthy Sister     x 2  . Cancer Brother     stomach    Social History History  Substance Use Topics  . Smoking status: Current Every Day Smoker -- 1.00 packs/day  . Smokeless tobacco: Never Used  . Alcohol Use: Yes    Are there smokers in your home (other than you)?  No  Risk Factors Current exercise habits: Walks alot on the job. Walking all day. Not official.  Dietary issues discussed: none.  Cardiac risk factors: male, age,  smoker, CAD,  Mild hyperglycemia..  Depression Screen (Note: if answer to either of the following is "Yes", a more complete depression screening is indicated)   Q1: Over the past two weeks, have you felt down, depressed or hopeless? Yes  Q2: Over the past two weeks, have you felt little interest or pleasure in doing things? No  Have you lost  interest or pleasure in daily life? Yes  Do you often feel hopeless? Yes  Do you cry easily over simple problems? No  Activities of Daily Living In your present state of health, do you have any difficulty performing the following activities?:  Driving? No Managing money?  No Feeding yourself? No Getting from bed to chair? No Climbing a flight of stairs? Yes Preparing food and eating?: No Bathing or showering? No Getting dressed: No Getting to the toilet? No Using the toilet:No Moving around from place to place: No In the past year have you fallen or had a near fall?:No   Are you sexually active?  No  Do you have more than one partner?  No  Hearing Difficulties: Sometimes. Do you often ask people to speak up or repeat themselves? Yes Do you experience ringing or noises in your ears? No Do you have difficulty understanding soft or whispered voices? Yes   Do you feel that you have a problem with memory? Yes. Occasional.  Do you often misplace items? No  Do you feel safe at home?  Yes  Cognitive Testing  Alert? Yes  Normal Appearance?Yes  Oriented to person? Yes  Place? Yes   Time? Yes  Recall of three objects?  Yes  Can perform simple calculations? Yes  Displays appropriate judgment?Yes  Can read the correct time from a watch face?Yes   Advanced Directives have been discussed with the patient? Yes. Pt is aware that he needs but has not done it.   List the Names of Other Physician/Practitioners you currently use: 1.  Dr. Johnsie Cancel [facility], Dr Katheran [facility].  Indicate any recent Medical Services you may have received from other  than [facility] providers in the past year (date may be approximate).  Immunization History  Administered Date(s) Administered  . Influenza Split 09/14/2012  . Influenza, High Dose Seasonal PF 09/24/2013    Screening Tests Health Maintenance  Topic Date Due  . TETANUS/TDAP  02/11/1965  . COLONOSCOPY  02/12/1996  . ZOSTAVAX  02/11/2006  . PNEUMOCOCCAL POLYSACCHARIDE VACCINE AGE 98 AND OVER  02/12/2011  . INFLUENZA VACCINE  07/13/2014    All answers were reviewed with the patient and necessary referrals were made:  Mackie Pai, PA-C   10/23/2014   History reviewed:  He  has a past medical history of Depression; Eating disorder; Hepatitis C; Hypertension; Lesion of liver (04/01/2013); Atherosclerosis of arteries (05/13/2013); Medial knee pain (09/26/2013); Abdominal pain, other specified site (09/26/2013); Cough (09/26/2013); Palpitations (06/21/2014); and Loss of weight (06/21/2014). He  does not have any pertinent problems on file. He  has past surgical history that includes Tumor removal (1990). His family history includes Cancer in his brother; Diabetes in his mother; Healthy in his sister. There is no history of Prostate cancer, Colon cancer, Breast cancer, Heart disease, or Hypertension. He  reports that he has been smoking.  He has never used smokeless tobacco. He reports that he drinks alcohol. He reports that he uses illicit drugs (Marijuana). He has a current medication list which includes the following prescription(s): albuterol, amlodipine, bupropion, bupropion, carbamazepine, diclofenac sodium, fish oil-omega-3 fatty acids, prednisone, prednisone, tramadol, and zoster vaccine live (pf). Current Outpatient Prescriptions on File Prior to Visit  Medication Sig Dispense Refill  . albuterol (PROVENTIL HFA;VENTOLIN HFA) 108 (90 BASE) MCG/ACT inhaler Inhale 1-2 puffs into the lungs every 6 (six) hours as needed for wheezing or shortness of breath. 1 Inhaler 0  . amLODipine (NORVASC) 5 MG  tablet take 1 tablet by  mouth once daily 30 tablet 5  . buPROPion (WELLBUTRIN XL) 150 MG 24 hr tablet Take 1 tablet (150 mg total) by mouth daily. 7 tablet 0  . buPROPion (WELLBUTRIN XL) 300 MG 24 hr tablet Take 1 tablet (300 mg total) by mouth daily. 30 tablet 2  . carbamazepine (TEGRETOL) 100 MG chewable tablet Chew 200 mg by mouth 2 (two) times daily as needed.    . diclofenac sodium (VOLTAREN) 1 % GEL Apply 4 g topically 4 (four) times daily as needed. Max 32gm per day. 5 Tube 1  . fish oil-omega-3 fatty acids 1000 MG capsule Take 2 g by mouth daily.    . predniSONE (DELTASONE) 20 MG tablet 2 tabs po daily x 5day then 1 tab po daily x 5 day then 1/2 tab po daily x 5 day 20 tablet 0  . predniSONE (DELTASONE) 5 MG tablet Start after done with 20 mg tabs then 1 tab po daily x 5 days then 1/2 tab po daily x 5 days 10 tablet 0  . traMADol (ULTRAM) 50 MG tablet Take 1 tablet (50 mg total) by mouth every 8 (eight) hours as needed. 60 tablet 1   No current facility-administered medications on file prior to visit.   He is allergic to iohexol.  Review of Systems A comprehensive review of systems was negative except for: Severe daily fatigue.    Objective:    Vision by Snellen chart: right CWU:GQBVQXI declines measurement, left HWT:UUEKCMK declines measurement Blood pressure 182/85, pulse 76, temperature 98.3 F (36.8 C), temperature source Oral, height 5\' 11"  (1.803 m), weight 171 lb 6.4 oz (77.747 kg), SpO2 98 %. Body mass index is 23.92 kg/(m^2).  General Mental Status- Alert. Orientation- Oriented x3.  Build and Nutrition- Well nourished and Well Developed.  Skin General:-Normal. Color- Normal color. Moisture- Normal. Temperature-Warm.  HEENT  Ears- Normal. Auditory Canal- Bilateral-Normal. Tympanic Membrane- Bilateral-Normal. Eye Fundi-Bilateral-Normal. Pupil- bilateral- Direct reaction to light normal. Nose & Sinuses- Normal. Nostrils-Bilateral- Normal. Mouth &  Throat-Normal.  Neck Neck- No Bruits or Masses. Trachea midline.  Thyroid- Normal.  Chest and Lung Exam Percussion: Quality and Intensity-Percussion normal. Percussion of the chest reveals- No Dullness.  Palpation: Palpation of the chest reveals- Non-tender- No dullness. Auscultation: Breath Sounds- Normal.  Adventitous Sounds:-No adventitious sounds.  Cardiovascular Inspection:- No Heaves. Auscultation:-Normal sinus rhythm without murmur gallop, S1 WNL and S2 WNL.  Abdomen Inspection:-Inspection Normal. Inspection of the abdomen reveals- No hernias Palpation/Percussion:- Palpation and Percussion of the Abdomen reveal- Non Tender and No Palpable abdominal masses. Liver: Other Characteristics- No hepatomegaly. Spleen:Other Characteristics- No Splenomegaly. Auscultation:- Auscultation of the abdomen reveals- Bowel sounds normal and No Abdominal bruits.  Male Genitourinary Deferred by pt.  Rectal Deferred by pt.  Peripheral  Vascular Lower Extremity:Inspection- Bilateral-Inspection Normal  Palpation: Femoral pulse- Bilateral- 2+. Popliteal pulse- Bilateral-2+. Dorsalis pedis pulse- Bilateral- 1/2+. Edema- Bilateral- No edema.  Neurologic(CN III-XII intact) neg rhomber. Finger to nose intact. Neg romberg.  Mental Status:- Normal. Cranial Nerves:-Normal Bilaterally. Motor:-Normal. Strength:5/5 normal muscle strength-All Muscles. General Assessment of Reflexes: Right Knee-2+. Left Knee- 2+. Coordination-Normal. Gait- Normal.  Meningeal Signs- None.  Musculoskeletal Global Assessment General-Joints show full range of motion without obvious deformity and Normal muscle mass. Strength in upper and lower extremities.  Lymphatics General lymphatics Description- No generalized lymphadenopathy.    Assessment:    Patient presents for yearly preventative medicine examination. Medicare questionnaire was completed  All immunizations and health maintenance protocols were  reviewed with the patient and needed orders  were placed.  Appropriate screening laboratory values were ordered for the patient including screening of hyperlipidemia, renal function and hepatic function. If indicated by BPH, a PSA was ordered.  Medication reconciliation,  past medical history, social history, problem list and allergies were reviewed in detail with the patient  Goals were established with regard to weight loss, exercise, and  diet in compliance with medications  End of life planning was discussed.      Plan:    During the course of the visit the patient was educated and counseled about appropriate screening and preventive services including:    fluvaccine, pneumovaccine(today). zostavax. Referal colonoscopy, Ct of chest(smoker) and audiology(for hearing eval).  Diet review for nutrition referral? Yes ____  Not Indicated ___x_   Patient Instructions (the written plan) was given to the patient.  Medicare Attestation I have personally reviewed: The patient's medical and social history Their use of alcohol, tobacco or illicit drugs Their current medications and supplements The patient's functional ability including ADLs,fall risks, home safety risks, cognitive, and hearing and visual impairment Diet and physical activities Evidence for depression or mood disorders  The patient's weight, height, BMI, and visual acuity have been recorded in the chart.  I have made referrals, counseling, and provided education to the patient based on review of the above and I have provided the patient with a written personalized care plan for preventive services.     Mackie Pai, PA-C   10/23/2014

## 2014-10-23 NOTE — Assessment & Plan Note (Signed)
Pt has profound fatigue so did labs today. Did order cbc, cmp today. He also has hx of hep c so will see if his enzymes are elevated.

## 2014-10-23 NOTE — Assessment & Plan Note (Signed)
With odor to urine and nitrates in urine. Culture is pending. Will go ahead and send cipro to his pharmacy.

## 2014-10-24 ENCOUNTER — Telehealth: Payer: Self-pay | Admitting: Medical

## 2014-10-24 LAB — CBC WITH DIFFERENTIAL/PLATELET
Basophils Absolute: 0 10*3/uL (ref 0.0–0.1)
Basophils Relative: 0.4 % (ref 0.0–3.0)
Eosinophils Absolute: 0.1 10*3/uL (ref 0.0–0.7)
Eosinophils Relative: 1.3 % (ref 0.0–5.0)
HCT: 41.1 % (ref 39.0–52.0)
Hemoglobin: 13.3 g/dL (ref 13.0–17.0)
Lymphocytes Relative: 40.9 % (ref 12.0–46.0)
Lymphs Abs: 3.2 10*3/uL (ref 0.7–4.0)
MCHC: 32.3 g/dL (ref 30.0–36.0)
MCV: 107 fl — ABNORMAL HIGH (ref 78.0–100.0)
Monocytes Absolute: 0.8 10*3/uL (ref 0.1–1.0)
Monocytes Relative: 10.1 % (ref 3.0–12.0)
Neutro Abs: 3.7 10*3/uL (ref 1.4–7.7)
Neutrophils Relative %: 47.3 % (ref 43.0–77.0)
Platelets: 275 10*3/uL (ref 150.0–400.0)
RBC: 3.85 Mil/uL — ABNORMAL LOW (ref 4.22–5.81)
RDW: 12.5 % (ref 11.5–15.5)
WBC: 7.8 10*3/uL (ref 4.0–10.5)

## 2014-10-24 LAB — COMPREHENSIVE METABOLIC PANEL
ALT: 49 U/L (ref 0–53)
AST: 53 U/L — ABNORMAL HIGH (ref 0–37)
Albumin: 3.3 g/dL — ABNORMAL LOW (ref 3.5–5.2)
Alkaline Phosphatase: 77 U/L (ref 39–117)
BUN: 18 mg/dL (ref 6–23)
CO2: 23 mEq/L (ref 19–32)
Calcium: 9.2 mg/dL (ref 8.4–10.5)
Chloride: 108 mEq/L (ref 96–112)
Creatinine, Ser: 1 mg/dL (ref 0.4–1.5)
GFR: 96.48 mL/min (ref >60.00)
Glucose, Bld: 100 mg/dL — ABNORMAL HIGH (ref 70–99)
Potassium: 4.2 mEq/L (ref 3.5–5.1)
Sodium: 141 mEq/L (ref 135–145)
Total Bilirubin: 0.5 mg/dL (ref 0.2–1.2)
Total Protein: 7.5 g/dL (ref 6.0–8.3)

## 2014-10-24 LAB — URINE CULTURE: Colony Count: >=100000

## 2014-10-24 LAB — HEMOGLOBIN A1C: Hgb A1c MFr Bld: 4.8 % (ref 4.6–6.5)

## 2014-10-24 NOTE — Telephone Encounter (Signed)
Did you send out his urine culture. I don't want to change cipro. Usually is good for uti and prostatitis. If we did send out culture then will know on Monday if has bacteria and with sensitivity can change. So that culture is very important.

## 2014-10-25 ENCOUNTER — Other Ambulatory Visit (INDEPENDENT_AMBULATORY_CARE_PROVIDER_SITE_OTHER): Payer: Medicare Other

## 2014-10-25 ENCOUNTER — Other Ambulatory Visit: Payer: Self-pay | Admitting: Medical

## 2014-10-25 DIAGNOSIS — R8290 Unspecified abnormal findings in urine: Secondary | ICD-10-CM

## 2014-10-25 DIAGNOSIS — N419 Inflammatory disease of prostate, unspecified: Secondary | ICD-10-CM

## 2014-10-26 LAB — LIPID PANEL
Cholesterol: 159 mg/dL (ref 0–200)
HDL: 60 mg/dL (ref >39)
LDL Cholesterol: 77 mg/dL (ref 0–99)
Total CHOL/HDL Ratio: 2.7 Ratio
Triglycerides: 111 mg/dL (ref <150)
VLDL: 22 mg/dL (ref 0–40)

## 2014-10-26 LAB — PSA: PSA: 0.01 ng/mL (ref <=4.00)

## 2014-10-27 LAB — URINE CULTURE: Colony Count: >=100000

## 2014-10-28 ENCOUNTER — Ambulatory Visit (HOSPITAL_BASED_OUTPATIENT_CLINIC_OR_DEPARTMENT_OTHER): Payer: Medicare Other

## 2014-10-28 ENCOUNTER — Other Ambulatory Visit (HOSPITAL_BASED_OUTPATIENT_CLINIC_OR_DEPARTMENT_OTHER): Payer: Medicare Other

## 2014-10-29 ENCOUNTER — Telehealth: Payer: Self-pay | Admitting: *Deleted

## 2014-10-29 NOTE — Telephone Encounter (Signed)
On the 12th he wanted to switch antibiotic and I sent LPN noted stating I did not recommend switching. Then got a message on the 13th asking if should start the antibiotic. So my question is did he ever take the cipro??

## 2014-10-29 NOTE — Telephone Encounter (Signed)
Pt called Ronald Wolf on Mon (10/28/14) wanting to know why his blood that was drawn on (10/23/14) was still in the lab.  He informed Ronald Wolf that the tube of blood was shown to him when he was here on (10/25/14).  I called the pt to fine out what questions he had and what I could help him with.  First the pt stated that he did not have any questions, and that he had spoken with someone and they had given him his lab results.   I asked the pt if he had any questions regarding the blood we had drawn on him, and he stated that he had blood drawn on (10/23/14) and he didn't no why we still had his blood in the lab when he came back for more blood work on (10/25/14).  I explained to the pt that we was asked to drawn a extra tube of blood in case we need to add on blood test.  And that is why we had the tube of blood.   Asked the pt if he had any other questions and he stated that he did not.  Asked if he understood and he stated that he did.//AB/CMA

## 2014-10-30 ENCOUNTER — Other Ambulatory Visit: Payer: Self-pay

## 2014-10-30 NOTE — Telephone Encounter (Signed)
Decided to advise pt to continue cipro since he thinks med is helping.

## 2014-10-30 NOTE — Telephone Encounter (Signed)
  Will advise lpn to pass message to continue cipro for 7 days.

## 2014-10-31 NOTE — Telephone Encounter (Signed)
Patient advised to complete Cipro since he states it is helping.

## 2014-11-05 ENCOUNTER — Ambulatory Visit (HOSPITAL_BASED_OUTPATIENT_CLINIC_OR_DEPARTMENT_OTHER)
Admission: RE | Admit: 2014-11-05 | Discharge: 2014-11-05 | Disposition: A | Discharge status: Home / Self Care | Payer: Medicare Other | Source: Ambulatory Visit | Attending: Medical | Admitting: Medical

## 2014-11-05 DIAGNOSIS — B1920 Unspecified viral hepatitis C without hepatic coma: Secondary | ICD-10-CM | POA: Insufficient documentation

## 2014-11-05 DIAGNOSIS — I2510 Atherosclerotic heart disease of native coronary artery without angina pectoris: Secondary | ICD-10-CM | POA: Diagnosis not present

## 2014-11-05 DIAGNOSIS — I700 Atherosclerosis of aorta: Secondary | ICD-10-CM | POA: Insufficient documentation

## 2014-11-05 DIAGNOSIS — M2578 Osteophyte, vertebrae: Secondary | ICD-10-CM | POA: Diagnosis not present

## 2014-11-05 DIAGNOSIS — F17200 Nicotine dependence, unspecified, uncomplicated: Secondary | ICD-10-CM

## 2014-11-05 DIAGNOSIS — Z720 Tobacco use: Secondary | ICD-10-CM | POA: Diagnosis not present

## 2014-11-05 DIAGNOSIS — J9811 Atelectasis: Secondary | ICD-10-CM | POA: Insufficient documentation

## 2014-11-05 DIAGNOSIS — I10 Essential (primary) hypertension: Secondary | ICD-10-CM | POA: Diagnosis present

## 2014-11-19 ENCOUNTER — Ambulatory Visit: Payer: Medicare Other | Admitting: Physician Assistant

## 2014-11-21 ENCOUNTER — Ambulatory Visit (INDEPENDENT_AMBULATORY_CARE_PROVIDER_SITE_OTHER): Payer: Medicare Other | Admitting: Medical

## 2014-11-21 ENCOUNTER — Encounter: Payer: Self-pay | Admitting: Medical

## 2014-11-21 VITALS — BP 175/80 | HR 79 | Temp 98.8°F | Wt 170.6 lb

## 2014-11-21 DIAGNOSIS — N3001 Acute cystitis with hematuria: Secondary | ICD-10-CM

## 2014-11-21 DIAGNOSIS — I10 Essential (primary) hypertension: Secondary | ICD-10-CM

## 2014-11-21 DIAGNOSIS — N390 Urinary tract infection, site not specified: Secondary | ICD-10-CM | POA: Insufficient documentation

## 2014-11-21 DIAGNOSIS — R8290 Unspecified abnormal findings in urine: Secondary | ICD-10-CM

## 2014-11-21 DIAGNOSIS — R319 Hematuria, unspecified: Secondary | ICD-10-CM

## 2014-11-21 DIAGNOSIS — B3749 Other urogenital candidiasis: Secondary | ICD-10-CM | POA: Insufficient documentation

## 2014-11-21 DIAGNOSIS — G500 Trigeminal neuralgia: Secondary | ICD-10-CM

## 2014-11-21 LAB — POCT URINALYSIS DIPSTICK
Bilirubin, UA: NEGATIVE
Glucose, UA: NEGATIVE
Ketones, UA: NEGATIVE
Nitrite, UA: NEGATIVE
Spec Grav, UA: 1.02
Urobilinogen, UA: 0.2
pH, UA: 6

## 2014-11-21 MED ORDER — SULFAMETHOXAZOLE-TRIMETHOPRIM 800-160 MG PO TABS: 1.0000 | ORAL_TABLET | Freq: Two times a day (BID) | ORAL | Status: DC

## 2014-11-21 MED ORDER — AMLODIPINE BESYLATE 10 MG PO TABS: 10.0000 mg | ORAL_TABLET | Freq: Every day | ORAL | Status: DC

## 2014-11-21 MED ORDER — TRAMADOL HCL 50 MG PO TABS
50.0000 mg | ORAL_TABLET | Freq: Four times a day (QID) | ORAL | Status: DC | PRN
Start: 2014-11-21 — End: 2015-03-27

## 2014-11-21 MED ORDER — CARBAMAZEPINE 100 MG PO CHEW: 200.0000 mg | CHEWABLE_TABLET | Freq: Two times a day (BID) | ORAL | Status: DC | PRN

## 2014-11-21 NOTE — Progress Notes (Signed)
Subjective:    Patient ID: Ronald Wolf, male    DOB: May 03, 1946, 68 y.o.   MRN: [id number]  HPI    Pt has some high blood pressure in the past. No ha, no chest pain. No neurololigic symptoms . No cardiac symptoms. He thinks bp due to facial  region pain.  Left side of face hurts. Pain for years. Pain is on and off in the past. Recent pain since Sunday. Pain is very sensitive to light touch. Pt had teeth pulled in the past trying to find out cause. Pt states that he saw neurolgist maybe 2 years ago. Pt is familiar with term trigeminal neuralgia but he is not sure if he had that diagnosis. Pt states for a while has been out of his carbamazepine which he took for facial pain.  Pt states some odor to his urine just recently. But no pain or fever.  Past Medical History  Diagnosis Date  . Depression   . Eating disorder   . Hepatitis C   . Hypertension   . Lesion of liver 04/01/2013  . Atherosclerosis of arteries 05/13/2013  . Medial knee pain 09/26/2013  . Abdominal pain, other specified site 09/26/2013  . Cough 09/26/2013  . Palpitations 06/21/2014  . Loss of weight 06/21/2014    History   Social History  . Marital Status: Single    Spouse Name: N/A    Number of Children: N/A  . Years of Education: N/A   Occupational History  . Not on file.   Social History Main Topics  . Smoking status: Current Every Day Smoker -- 1.00 packs/day  . Smokeless tobacco: Never Used  . Alcohol Use: Yes  . Drug Use: Yes    Special: Marijuana  . Sexual Activity: Not on file   Other Topics Concern  . Not on file   Social History Narrative    Past Surgical History  Procedure Laterality Date  . 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 Prostate cancer Neg Hx   . Colon cancer Neg Hx   . Breast cancer Neg Hx   . Heart disease Neg Hx   . Diabetes Mother   . Hypertension Neg Hx   . Healthy Sister     x 2  . Cancer Brother     stomach     Allergies  Allergen Reactions  . Iohexol      Desc: pt broke out in hives years ago from iv contrast. he was given 50mg  benadryl po, 1 hr prior to ct and did fine today w/o complications.  JB     Current Outpatient Prescriptions on File Prior to Visit  Medication Sig Dispense Refill  . amLODipine (NORVASC) 5 MG tablet take 1 tablet by mouth once daily 30 tablet 5  . albuterol (PROVENTIL HFA;VENTOLIN HFA) 108 (90 BASE) MCG/ACT inhaler Inhale 1-2 puffs into the lungs every 6 (six) hours as needed for wheezing or shortness of breath. (Patient not taking: Reported on 11/21/2014) 1 Inhaler 0  . buPROPion (WELLBUTRIN XL) 150 MG 24 hr tablet Take 1 tablet (150 mg total) by mouth daily. (Patient not taking: Reported on 11/21/2014) 7 tablet 0  . buPROPion (WELLBUTRIN XL) 300 MG 24 hr tablet Take 1 tablet (300 mg total) by mouth daily. (Patient not taking: Reported on 11/21/2014) 30 tablet 2  . ciprofloxacin (CIPRO) 500 MG tablet Take 1 tablet (500 mg total) by mouth  2 (two) times daily. (Patient not taking: Reported on 11/21/2014) 20 tablet 0  . diclofenac sodium (VOLTAREN) 1 % GEL Apply 4 g topically 4 (four) times daily as needed. Max 32gm per day. (Patient not taking: Reported on 11/21/2014) 5 Tube 1  . fish oil-omega-3 fatty acids 1000 MG capsule Take 2 g by mouth daily.    . predniSONE (DELTASONE) 20 MG tablet 2 tabs po daily x 5day then 1 tab po daily x 5 day then 1/2 tab po daily x 5 day (Patient not taking: Reported on 11/21/2014) 20 tablet 0  . predniSONE (DELTASONE) 5 MG tablet Start after done with 20 mg tabs then 1 tab po daily x 5 days then 1/2 tab po daily x 5 days (Patient not taking: Reported on 11/21/2014) 10 tablet 0  . traMADol (ULTRAM) 50 MG tablet Take 1 tablet (50 mg total) by mouth every 8 (eight) hours as needed. (Patient not taking: Reported on 11/21/2014) 60 tablet 1  . zoster vaccine live, PF, (ZOSTAVAX) 34742 UNT/0.65ML injection Inject 19,400 Units into the skin once.  (Patient not taking: Reported on 11/21/2014) 1 each 0   No current facility-administered medications on file prior to visit.    BP 175/80 mmHg  Pulse 79  Temp(Src) 98.8 F (37.1 C) (Oral)  Wt 170 lb 9.6 oz (77.384 kg)  SpO2 97%      Review of Systems  Constitutional: Negative for fever, chills and fatigue.  HENT: Negative.   Respiratory: Negative for cough, chest tightness, shortness of breath and wheezing.   Cardiovascular: Negative for chest pain and palpitations.  Gastrointestinal: Negative.   Genitourinary: Negative for dysuria, frequency, flank pain, decreased urine volume, discharge, scrotal swelling, enuresis, penile pain and testicular pain.       He states bad odor to his urine this week.  Musculoskeletal: Negative for back pain.  Skin:       Tender area left cheek region.  Neurological: Negative for dizziness, tremors, seizures, syncope, facial asymmetry, speech difficulty, weakness, light-headedness, numbness and headaches.       Sharp transient intremittent severe facial pain left side.  Hematological: Negative for adenopathy. Does not bruise/bleed easily.       Objective:   Physical Exam   General Mental Status- Alert. General Appearance- Not in acute distress.   Skin General: Color- Normal Color. Moisture- Normal Moisture.  Neck Carotid Arteries- Normal color. Moisture- Normal Moisture. No carotid bruits. No JVD.  Chest and Lung Exam Auscultation: Breath Sounds:-Normal.  Cardiovascular Auscultation:Rythm- Regular. Murmurs & Other Heart Sounds:Auscultation of the heart reveals- No Murmurs.  Abdomen Inspection:-Inspeection Normal. Palpation/Percussion:Note:No mass. Palpation and Percussion of the abdomen reveal- Non Tender, Non Distended + BS, no rebound or guarding.  Back- no cva tenderness  HEENT- Rt tm normal. Lt tm- faint minimal redness in center. No frontal or rt side maxillary sinus tenderness. Mouth- when pt removed his upper denture, I  palpated his palate and could cause onset of his severe pain.   Skin- on palpation of skin over his lt cheek. Small 1cm x 1cm area that is mild indurate and tender.    Neurologic Cranial Nerve exam:- CN III-XII intact(No nystagmus), symmetric smile. Drift Test:- No drift. Romberg Exam:- Negative.  .Finger to Nose:- Normal/Intact Strength:- 5/5 equal and symmetric strength both upper and lower extremities.        Assessment & Plan:

## 2014-11-21 NOTE — Assessment & Plan Note (Signed)
For your blood pressure which is elevated, I want you to take amlodipine 10 mg every day. I sent in new prescription.

## 2014-11-21 NOTE — Assessment & Plan Note (Signed)
I refilled prior rx for which he had ran out and has not been on for weeks per his report.

## 2014-11-21 NOTE — Patient Instructions (Addendum)
For your blood pressure which is elevated, I want you to take amlodipine 10 mg every day. I sent in new prescription.  You may have skin infection of rt cheek so I am prescribing bactrim ds. You report odor to your urine. I will send your urine out for culture and bactrim is antibiotic that can treat uti. Will get psa today.  Please see dentist for refit of your denture. As this may be factor in your pain.  To help with the facial pain, I am prescribing tramadol  Follow up 7 days or as needed.

## 2014-11-21 NOTE — Assessment & Plan Note (Signed)
You report odor to your urine. I will send your urine out for culture and bactrim is antibiotic that can treat uti. Will get psa today.

## 2014-11-21 NOTE — Progress Notes (Signed)
Pre visit review using our clinic review tool, if applicable. No additional management support is needed unless otherwise documented below in the visit note. 

## 2014-11-22 LAB — PSA: PSA: 0.01 ng/mL — ABNORMAL LOW (ref 0.10–4.00)

## 2014-11-25 ENCOUNTER — Telehealth: Payer: Self-pay | Admitting: Medical

## 2014-11-25 LAB — URINE CULTURE: Colony Count: >=100000

## 2014-11-25 MED ORDER — AMPICILLIN 500 MG PO CAPS: 500.0000 mg | ORAL_CAPSULE | Freq: Three times a day (TID) | ORAL | Status: DC

## 2014-11-25 NOTE — Telephone Encounter (Signed)
rx ampicillin per urine culture.

## 2014-12-19 ENCOUNTER — Telehealth: Payer: Self-pay

## 2014-12-19 NOTE — Telephone Encounter (Signed)
Patient called in to schedule an appointment. States that he had fallen yesterday, has a laceration, has a headache with dizziness, and blurred vision. Advised patient to go to ED now. Information was reviewed by Dr B as well.  Patient ask if he could go in the morning. I advised him that these symptoms are not something that should wait and he should go NOW!! States understanding.   Patient also requested to get a refill on Tegretol and antibiotics from his last visit. Recommended that if  he is still experiencing the same issues that he should discuss with the ED provider as well. Verbalizes that he understands.

## 2014-12-24 ENCOUNTER — Telehealth: Payer: Self-pay | Admitting: Family Medicine

## 2014-12-24 NOTE — Telephone Encounter (Signed)
Caller name: Ronald Wolf, Ronald Wolf Relation to pt: self  Call back number: [phone] Pharmacy:  [facility] - [place], [place] [phone] (Phone)     Reason for call:  pt states he is completely out requesting a refill ampicillin (PRINCIPEN) 500 MG capsule & carbamazepine (TEGRETOL) 100 MG chewable tablet

## 2014-12-25 ENCOUNTER — Other Ambulatory Visit: Payer: Self-pay

## 2014-12-25 MED ORDER — AMPICILLIN 500 MG PO CAPS: 500.0000 mg | ORAL_CAPSULE | Freq: Three times a day (TID) | ORAL | Status: DC

## 2014-12-25 MED ORDER — CARBAMAZEPINE 100 MG PO CHEW: 200.0000 mg | CHEWABLE_TABLET | Freq: Two times a day (BID) | ORAL | Status: DC | PRN

## 2015-03-24 ENCOUNTER — Other Ambulatory Visit: Payer: Self-pay

## 2015-03-24 MED ORDER — AMPICILLIN 500 MG PO CAPS: 500.0000 mg | ORAL_CAPSULE | Freq: Three times a day (TID) | ORAL | Status: DC

## 2015-03-24 MED ORDER — CARBAMAZEPINE 100 MG PO CHEW: 200.0000 mg | CHEWABLE_TABLET | Freq: Two times a day (BID) | ORAL | Status: DC | PRN

## 2015-03-24 NOTE — Telephone Encounter (Signed)
Patient requesting medications to last until he can be seen on Thursday. States he has urine odor and can come in to give specemine prior to Thursday if necessary. He is also having problems wit his jaw and has not been able to eat.Marland Kitchen Has to come in after 3:30 for appointment .

## 2015-03-25 NOTE — Telephone Encounter (Signed)
Left message for patient that temporary supply of the 2 medications he requested had been sent to pharmacy per his request.

## 2015-03-26 ENCOUNTER — Other Ambulatory Visit: Payer: Medicare Other

## 2015-03-27 ENCOUNTER — Ambulatory Visit (INDEPENDENT_AMBULATORY_CARE_PROVIDER_SITE_OTHER): Payer: Medicare Other | Admitting: Medical

## 2015-03-27 ENCOUNTER — Encounter: Payer: Self-pay | Admitting: Medical

## 2015-03-27 VITALS — BP 162/84 | HR 90 | Temp 98.2°F | Ht 71.0 in | Wt 166.2 lb

## 2015-03-27 DIAGNOSIS — F329 Major depressive disorder, single episode, unspecified: Secondary | ICD-10-CM

## 2015-03-27 DIAGNOSIS — R8290 Unspecified abnormal findings in urine: Secondary | ICD-10-CM | POA: Diagnosis not present

## 2015-03-27 DIAGNOSIS — G500 Trigeminal neuralgia: Secondary | ICD-10-CM

## 2015-03-27 DIAGNOSIS — N390 Urinary tract infection, site not specified: Secondary | ICD-10-CM | POA: Diagnosis not present

## 2015-03-27 DIAGNOSIS — F418 Other specified anxiety disorders: Secondary | ICD-10-CM

## 2015-03-27 DIAGNOSIS — F32A Depression, unspecified: Secondary | ICD-10-CM

## 2015-03-27 DIAGNOSIS — R82998 Other abnormal findings in urine: Secondary | ICD-10-CM

## 2015-03-27 DIAGNOSIS — Z87440 Personal history of urinary (tract) infections: Secondary | ICD-10-CM

## 2015-03-27 DIAGNOSIS — F419 Anxiety disorder, unspecified: Secondary | ICD-10-CM

## 2015-03-27 LAB — POCT URINALYSIS DIPSTICK
Bilirubin, UA: 17
Blood, UA: NEGATIVE
Glucose, UA: NEGATIVE
Ketones, UA: 50
Nitrite, UA: NEGATIVE
Protein, UA: 100
Spec Grav, UA: 1.015
Urobilinogen, UA: 0.2
pH, UA: 7

## 2015-03-27 MED ORDER — CARBAMAZEPINE 100 MG PO CHEW: 200.0000 mg | CHEWABLE_TABLET | Freq: Two times a day (BID) | ORAL | Status: DC | PRN

## 2015-03-27 MED ORDER — AMLODIPINE BESYLATE 10 MG PO TABS
10.0000 mg | ORAL_TABLET | Freq: Every day | ORAL | Status: DC
Start: 2015-03-27 — End: 2016-05-06

## 2015-03-27 MED ORDER — TRAMADOL HCL 50 MG PO TABS: 50.0000 mg | ORAL_TABLET | Freq: Four times a day (QID) | ORAL | Status: DC | PRN

## 2015-03-27 NOTE — Assessment & Plan Note (Signed)
Pt will continue tegretol and will rx tramadol. Ask that he let us know if combination not stopping pain.

## 2015-03-27 NOTE — Assessment & Plan Note (Signed)
I asked him to follow up in 3 wks to see if his pain controlled and to assess mood. May give antidepressant on follow up.

## 2015-03-27 NOTE — Progress Notes (Signed)
Subjective:    Patient ID: Ronald Wolf, male    DOB: April 09, 1946, 69 y.o.   MRN: [id number]  HPI  Pt in for follow up. He has history of jaw pain. He by history of trigeminal neuralgia. Pt has seen various dentist in the past and had teeth pulled before dx made. He ran out of tegrotol about 2 wks ago. Pt had some tramadol in the past.  Pt states frustrated and depressed due to pain. This has been the case for 2 wks. Pain constant now.  Pt is also urinating frequently past week. Hx of bladder removed.Pt states urine from catheter. Hx of prostate cancer. No fever or chills. Foul smell to urine.  Review of Systems  Constitutional: Negative for fever, chills and fatigue.  Respiratory: Negative for cough, choking and wheezing.   Cardiovascular: Negative for chest pain and palpitations.  Gastrointestinal: Negative for vomiting, abdominal pain and anal bleeding.  Musculoskeletal: Negative for arthralgias and neck pain.       Lt jaw area pain.  Hematological: Negative for adenopathy. Does not bruise/bleed easily.  Psychiatric/Behavioral: Positive for dysphoric mood. Negative for suicidal ideas and behavioral problems. The patient is not nervous/anxious.        Due to pain.   Past Medical History  Diagnosis Date  . Depression   . Eating disorder   . Hepatitis C   . Hypertension   . Lesion of liver 04/01/2013  . Atherosclerosis of arteries 05/13/2013  . Medial knee pain 09/26/2013  . Abdominal pain, other specified site 09/26/2013  . Cough 09/26/2013  . Palpitations 06/21/2014  . Loss of weight 06/21/2014    History   Social History  . Marital Status: Single    Spouse Name: N/A  . Number of Children: N/A  . Years of Education: N/A   Occupational History  . Not on file.   Social History Main Topics  . Smoking status: Current Every Day Smoker -- 1.00 packs/day  . Smokeless tobacco: Never Used  . Alcohol Use: Yes  . Drug Use: Yes    Special: Marijuana  . Sexual Activity: Not  on file   Other Topics Concern  . Not on file   Social History Narrative    Past Surgical History  Procedure Laterality Date  . 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 Prostate cancer Neg Hx   . Colon cancer Neg Hx   . Breast cancer Neg Hx   . Heart disease Neg Hx   . Diabetes Mother   . Hypertension Neg Hx   . Healthy Sister     x 2  . Cancer Brother     stomach    Allergies  Allergen Reactions  . Iohexol      Desc: pt broke out in hives years ago from iv contrast. he was given 50mg  benadryl po, 1 hr prior to ct and did fine today w/o complications.  JB     Current Outpatient Prescriptions on File Prior to Visit  Medication Sig Dispense Refill  . albuterol (PROVENTIL HFA;VENTOLIN HFA) 108 (90 BASE) MCG/ACT inhaler Inhale 1-2 puffs into the lungs every 6 (six) hours as needed for wheezing or shortness of breath. (Patient not taking: Reported on 11/21/2014) 1 Inhaler 0  . amLODipine (NORVASC) 5 MG tablet take 1 tablet by mouth once daily 30 tablet 5  . ampicillin (PRINCIPEN) 500 MG capsule Take 1 capsule (500 mg  total) by mouth 3 (three) times daily. 9 capsule 0  . buPROPion (WELLBUTRIN XL) 150 MG 24 hr tablet Take 1 tablet (150 mg total) by mouth daily. (Patient not taking: Reported on 11/21/2014) 7 tablet 0  . buPROPion (WELLBUTRIN XL) 300 MG 24 hr tablet Take 1 tablet (300 mg total) by mouth daily. (Patient not taking: Reported on 11/21/2014) 30 tablet 2  . carbamazepine (TEGRETOL) 100 MG chewable tablet Chew 2 tablets (200 mg total) by mouth 2 (two) times daily as needed. 60 tablet 0  . ciprofloxacin (CIPRO) 500 MG tablet Take 1 tablet (500 mg total) by mouth 2 (two) times daily. (Patient not taking: Reported on 11/21/2014) 20 tablet 0  . diclofenac sodium (VOLTAREN) 1 % GEL Apply 4 g topically 4 (four) times daily as needed. Max 32gm per day. (Patient not taking: Reported on 11/21/2014) 5 Tube 1  . fish oil-omega-3 fatty  acids 1000 MG capsule Take 2 g by mouth daily.    . predniSONE (DELTASONE) 20 MG tablet 2 tabs po daily x 5day then 1 tab po daily x 5 day then 1/2 tab po daily x 5 day (Patient not taking: Reported on 11/21/2014) 20 tablet 0  . predniSONE (DELTASONE) 5 MG tablet Start after done with 20 mg tabs then 1 tab po daily x 5 days then 1/2 tab po daily x 5 days (Patient not taking: Reported on 11/21/2014) 10 tablet 0  . sulfamethoxazole-trimethoprim (BACTRIM DS,SEPTRA DS) 800-160 MG per tablet Take 1 tablet by mouth 2 (two) times daily. 14 tablet 0  . traMADol (ULTRAM) 50 MG tablet Take 1 tablet (50 mg total) by mouth every 8 (eight) hours as needed. (Patient not taking: Reported on 11/21/2014) 60 tablet 1  . zoster vaccine live, PF, (ZOSTAVAX) 47829 UNT/0.65ML injection Inject 19,400 Units into the skin once. (Patient not taking: Reported on 11/21/2014) 1 each 0   No current facility-administered medications on file prior to visit.    BP 162/84 mmHg  Pulse 90  Temp(Src) 98.2 F (36.8 C) (Oral)  Ht 5\' 11"  (1.803 m)  Wt 166 lb 3.2 oz (75.388 kg)  BMI 23.19 kg/m2  SpO2 98%      Objective:   Physical Exam  General  Mental Status - Alert. General Appearance - Well groomed. Not in acute distress.  Skin Rashes- No Rashes.  HEENT Head- Normal. Ear Auditory Canal - Left- Normal. Right - Normal.Tympanic Membrane- Left- Normal. Right- Normal. Eye Sclera/Conjunctiva- Left- Normal. Right- Normal. Nose & Sinuses Nasal Mucosa- Left-  Not boggy or Congested. Right-  Not  boggy or Congested. Mouth & Throat Lips: Upper Lip- Normal: no dryness, cracking, pallor, cyanosis, or vesicular eruption. Lower Lip-Normal: no dryness, cracking, pallor, cyanosis or vesicular eruption. Buccal Mucosa- Bilateral- No Aphthous ulcers. Oropharynx- No Discharge or Erythema. Tonsils: Characteristics- Bilateral- No Erythema or Congestion. Size/Enlargement- Bilateral- No enlargement. Discharge- bilateral-None.  Lt jaw-  faint tender to palpation presently.   Abdomen Inspection:-Inspection Normal.  Palpation/Perucssion: Palpation and Percussion of the abdomen reveal- Non Tender, No Rebound tenderness, No rigidity(Guarding) and No Palpable abdominal masses.  Liver:-Normal.  Spleen:- Normal.   Back- no cva tenderness.    Chest and Lung Exam Auscultation: Breath Sounds:-Clear even and unlabored.  Cardiovascular Auscultation:Rythm- Regular, rate and rhythm. Murmurs & Other Heart Sounds:Ausculatation of the heart reveal- No Murmurs.  Lymphatic Head & Neck General Head & Neck Lymphatics: Bilateral: Description- No Localized lymphadenopathy.  Neuro- CN III- XII grossly intact.        Assessment &  Plan:

## 2015-03-27 NOTE — Assessment & Plan Note (Signed)
Foul odor to urine today. Will get culture. Pt had antibiotic sent to his pharmacy the other day. Pending urine culture.

## 2015-03-27 NOTE — Patient Instructions (Addendum)
Trigeminal neuralgia Pt will continue tegretol and will rx tramadol. Ask that he let us know if combination not stopping pain.   History of UTI Foul odor to urine today. Will get culture. Pt had antibiotic sent to his pharmacy the other day. Pending urine culture.   Anxiety and depression I asked him to follow up in 3 wks to see if his pain controlled and to assess mood. May give antidepressant on follow up.   Second rx of tramadol accidentally printed. This rx was shredded.  Follow up 3 wks or as needed.  Asked staff/lpn to get pt to sign controlled med contract.  Yesterday pt gave uds.

## 2015-03-27 NOTE — Progress Notes (Signed)
Pre visit review using our clinic review tool, if applicable. No additional management support is needed unless otherwise documented below in the visit note. 

## 2015-03-30 ENCOUNTER — Telehealth: Payer: Self-pay | Admitting: Medical

## 2015-03-30 LAB — URINE CULTURE: Colony Count: >=100000

## 2015-03-30 MED ORDER — AMPICILLIN 500 MG PO CAPS: 500.0000 mg | ORAL_CAPSULE | Freq: Three times a day (TID) | ORAL | Status: DC

## 2015-03-30 NOTE — Telephone Encounter (Signed)
rx ampicillin.

## 2015-03-31 NOTE — Telephone Encounter (Signed)
Patient informed 4 additional days  ABO sent to pharmacy.

## 2015-04-24 ENCOUNTER — Ambulatory Visit (INDEPENDENT_AMBULATORY_CARE_PROVIDER_SITE_OTHER): Payer: Medicare Other | Admitting: Medical

## 2015-04-24 ENCOUNTER — Encounter: Payer: Self-pay | Admitting: Medical

## 2015-04-24 ENCOUNTER — Telehealth: Payer: Self-pay | Admitting: Medical

## 2015-04-24 VITALS — BP 172/91 | HR 89 | Temp 98.8°F | Ht 71.0 in | Wt 158.2 lb

## 2015-04-24 DIAGNOSIS — R8290 Unspecified abnormal findings in urine: Secondary | ICD-10-CM

## 2015-04-24 DIAGNOSIS — F329 Major depressive disorder, single episode, unspecified: Secondary | ICD-10-CM | POA: Insufficient documentation

## 2015-04-24 DIAGNOSIS — R42 Dizziness and giddiness: Secondary | ICD-10-CM

## 2015-04-24 DIAGNOSIS — N390 Urinary tract infection, site not specified: Secondary | ICD-10-CM

## 2015-04-24 DIAGNOSIS — R51 Headache: Secondary | ICD-10-CM

## 2015-04-24 DIAGNOSIS — G4452 New daily persistent headache (NDPH): Secondary | ICD-10-CM

## 2015-04-24 DIAGNOSIS — G500 Trigeminal neuralgia: Secondary | ICD-10-CM

## 2015-04-24 DIAGNOSIS — Z8551 Personal history of malignant neoplasm of bladder: Secondary | ICD-10-CM

## 2015-04-24 DIAGNOSIS — F418 Other specified anxiety disorders: Secondary | ICD-10-CM

## 2015-04-24 DIAGNOSIS — F419 Anxiety disorder, unspecified: Secondary | ICD-10-CM

## 2015-04-24 DIAGNOSIS — I10 Essential (primary) hypertension: Secondary | ICD-10-CM

## 2015-04-24 DIAGNOSIS — R519 Headache, unspecified: Secondary | ICD-10-CM | POA: Insufficient documentation

## 2015-04-24 DIAGNOSIS — F32A Depression, unspecified: Secondary | ICD-10-CM | POA: Insufficient documentation

## 2015-04-24 DIAGNOSIS — R82998 Other abnormal findings in urine: Secondary | ICD-10-CM

## 2015-04-24 LAB — POCT URINALYSIS DIPSTICK
Bilirubin, UA: 1
Glucose, UA: NEGATIVE
Ketones, UA: NEGATIVE
Nitrite, UA: POSITIVE
Protein, UA: 100
Spec Grav, UA: 1.015
Urobilinogen, UA: 0.2
pH, UA: 7

## 2015-04-24 MED ORDER — CARBAMAZEPINE 200 MG PO TABS: 200.0000 mg | ORAL_TABLET | Freq: Three times a day (TID) | ORAL | Status: DC

## 2015-04-24 MED ORDER — LOSARTAN POTASSIUM 50 MG PO TABS: 50.0000 mg | ORAL_TABLET | Freq: Every day | ORAL | Status: DC

## 2015-04-24 MED ORDER — BUPROPION HCL ER (XL) 150 MG PO TB24: 150.0000 mg | ORAL_TABLET | Freq: Every day | ORAL | Status: DC

## 2015-04-24 NOTE — Assessment & Plan Note (Signed)
New ha may be bp related but also constant dizziness. Will try to get bp down but also try to get ct of head stat without contrast.

## 2015-04-24 NOTE — Progress Notes (Signed)
Subjective:    Patient ID: Ronald Wolf, male    DOB: [birth year], 69 y.o.   MRN: [id number]  HPI  Pt in states that he has some history of trigeminal neuralgia. Pain he has severe sensitivity to left upper lip. He states when he touches his face feels like lightning. Pt states he can't eat much due to pain.  Pt is on tegretol for pain.   Pt also states he is feeling dizziness since last visit. He has lost balance 2-3 times since last saw him.  Pt reports he has gotten ha recently for about 10 days. He reports he almost never gets ha.   He is not sure if tramadol effected his balance. He has not been taking much of tramadol.  Pt is getting depressed due to his conditions above. But on review he states he is not taking wellbutrin.  Review of Systems  Constitutional: Negative for fever, chills, diaphoresis, activity change and fatigue.  Respiratory: Negative for cough, chest tightness and shortness of breath.   Cardiovascular: Negative for chest pain, palpitations and leg swelling.  Gastrointestinal: Negative for nausea, vomiting and abdominal pain.  Genitourinary: Negative.        Foul urine smell.  Musculoskeletal: Negative for back pain, neck pain and neck stiffness.  Neurological: Positive for dizziness and headaches. Negative for tremors, seizures, syncope, facial asymmetry, speech difficulty, weakness, light-headedness and numbness.       Mild dizziness and. Ha.  Left side face pain  Hematological: Negative for adenopathy. Does not bruise/bleed easily.  Psychiatric/Behavioral: Positive for dysphoric mood. Negative for suicidal ideas, behavioral problems, confusion, sleep disturbance and agitation. The patient is not nervous/anxious.     Past Medical History  Diagnosis Date  . Depression   . Eating disorder   . Hepatitis C   . Hypertension   . Lesion of liver 04/01/2013  . Atherosclerosis of arteries 05/13/2013  . Medial knee pain 09/26/2013  . Abdominal pain, other  specified site 09/26/2013  . Cough 09/26/2013  . Palpitations 06/21/2014  . Loss of weight 06/21/2014    History   Social History  . Marital Status: Single    Spouse Name: N/A  . Number of Children: N/A  . Years of Education: N/A   Occupational History  . Not on file.   Social History Main Topics  . Smoking status: Current Every Day Smoker -- 1.00 packs/day  . Smokeless tobacco: Never Used  . Alcohol Use: Yes  . Drug Use: Yes    Special: Marijuana  . Sexual Activity: Not on file   Other Topics Concern  . Not on file   Social History Narrative    Past Surgical History  Procedure Laterality Date  . 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 Prostate cancer Neg Hx   . Colon cancer Neg Hx   . Breast cancer Neg Hx   . Heart disease Neg Hx   . Diabetes Mother   . Hypertension Neg Hx   . Healthy Sister     x 2  . Cancer Brother     stomach    Allergies  Allergen Reactions  . Iohexol      Desc: pt broke out in hives years ago from iv contrast. he was given 50mg  benadryl po, 1 hr prior to ct and did fine today w/o complications.  JB     Current Outpatient Prescriptions on File Prior  to Visit  Medication Sig Dispense Refill  . amLODipine (NORVASC) 10 MG tablet Take 1 tablet (10 mg total) by mouth daily. 30 tablet 5  . buPROPion (WELLBUTRIN XL) 150 MG 24 hr tablet Take 1 tablet (150 mg total) by mouth daily. 7 tablet 0  . fish oil-omega-3 fatty acids 1000 MG capsule Take 2 g by mouth daily.    Marland Kitchen sulfamethoxazole-trimethoprim (BACTRIM DS,SEPTRA DS) 800-160 MG per tablet Take 1 tablet by mouth 2 (two) times daily. 14 tablet 0  . traMADol (ULTRAM) 50 MG tablet Take 1 tablet (50 mg total) by mouth every 6 (six) hours as needed. 30 tablet 0  . albuterol (PROVENTIL HFA;VENTOLIN HFA) 108 (90 BASE) MCG/ACT inhaler Inhale 1-2 puffs into the lungs every 6 (six) hours as needed for wheezing or shortness of breath. (Patient not taking:  Reported on 11/21/2014) 1 Inhaler 0  . predniSONE (DELTASONE) 20 MG tablet 2 tabs po daily x 5day then 1 tab po daily x 5 day then 1/2 tab po daily x 5 day (Patient not taking: Reported on 11/21/2014) 20 tablet 0  . predniSONE (DELTASONE) 5 MG tablet Start after done with 20 mg tabs then 1 tab po daily x 5 days then 1/2 tab po daily x 5 days (Patient not taking: Reported on 11/21/2014) 10 tablet 0  . traMADol (ULTRAM) 50 MG tablet Take 1 tablet (50 mg total) by mouth every 8 (eight) hours as needed. (Patient not taking: Reported on 11/21/2014) 60 tablet 1  . zoster vaccine live, PF, (ZOSTAVAX) 25638 UNT/0.65ML injection Inject 19,400 Units into the skin once. (Patient not taking: Reported on 11/21/2014) 1 each 0   No current facility-administered medications on file prior to visit.    BP 172/91 mmHg  Pulse 89  Temp(Src) 98.8 F (37.1 C) (Oral)  Ht 5\' 11"  (1.803 m)  Wt 158 lb 3.2 oz (71.759 kg)  BMI 22.07 kg/m2  SpO2 98%      Objective:   Physical Exam  General  Mental Status - Alert. General Appearance - Well groomed. Not in acute distress.  Skin Rashes- No Rashes.  HEENT Head- Normal. Ear Auditory Canal - Left- Normal. Right - Normal.Tympanic Membrane- Left- Normal. Right- Normal. Eye Sclera/Conjunctiva- Left- Normal. Right- Normal. Nose & Sinuses Nasal Mucosa- Left- Not boggy or Congested. Right- Not boggy or Congested. Mouth & Throat Lips: Upper Lip- Normal: no dryness, cracking, pallor, cyanosis, or vesicular eruption. Lower Lip-Normal: no dryness, cracking, pallor, cyanosis or vesicular eruption. Buccal Mucosa- Bilateral- No Aphthous ulcers. Oropharynx- No Discharge or Erythema. Tonsils: Characteristics- Bilateral- No Erythema or Congestion. Size/Enlargement- Bilateral- No enlargement. Discharge- bilateral-None.  Lt jaw- faint tender to palpation presently.   Abdomen Inspection:-Inspection Normal.  Palpation/Perucssion: Palpation and Percussion of the abdomen  reveal- Non Tender, No Rebound tenderness, No rigidity(Guarding) and No Palpable abdominal masses.  Liver:-Normal.  Spleen:- Normal.   Back- no cva tenderness.    Chest and Lung Exam Auscultation: Breath Sounds:-Clear even and unlabored.  Cardiovascular Auscultation:Rythm- Regular, rate and rhythm. Murmurs & Other Heart Sounds:Ausculatation of the heart reveal- No Murmurs.  Lymphatic Head & Neck General Head & Neck Lymphatics: Bilateral: Description- No Localized lymphadenopathy.     . Neurologic Cranial Nerve exam:- CN III-XII intact(No nystagmus), symmetric smile. Romberg Exam:- Negative.  Finger to Nose:- Normal/Intact Strength:- 5/5 equal and symmetric strength both upper and lower extremities.     Assessment & Plan:

## 2015-04-24 NOTE — Assessment & Plan Note (Signed)
Rx wellbutrin.

## 2015-04-24 NOTE — Assessment & Plan Note (Signed)
Hx of. 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 Pt has chronic foul smelling urine. I will refer pt to urologist. Repeat urine culture today.

## 2015-04-24 NOTE — Assessment & Plan Note (Signed)
Will refer you back to your neurologist. Increase tegretol to 200 mg 3 times daily.

## 2015-04-24 NOTE — Telephone Encounter (Signed)
Message to referral staff. To schedule ct stat.

## 2015-04-24 NOTE — Patient Instructions (Addendum)
HTN (hypertension) Losartan 50 mg 1 tab a day. Continue amlodipine. Get cbc and cmp today.     Trigeminal neuralgia Will refer you back to your neurologist. Increase tegretol to 200 mg 3 times daily.   Headache New ha may be bp related but also constant dizziness. Will try to get bp down but also try to get ct of head stat without contrast.    Bladder cancer Hx of. 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 Pt has chronic foul smelling urine. I will refer pt to urologist. Repeat urine culture today.     If dizziness or ha pending ct head then go to ED. Or if new neurologic type symptom occur then ED.   Follow up in 1-1.5 month with Dr. Charlett Blake. She has seen you in the past and want to make sure she is aware of your condition.

## 2015-04-24 NOTE — Assessment & Plan Note (Signed)
Losartan 50 mg 1 tab a day. Continue amlodipine. Get cbc and cmp today.

## 2015-04-24 NOTE — Telephone Encounter (Signed)
I called pt and notified him that I have his tramadol bottle here in our office. I asked him to pick it up tomorrow. Explained that I want him to have it for severe pain and it is type of med that he needs to be in his possession. If by chance he does not pick it up by 11 am will call him again. As I do not want to hold this medication.When I was placing his numerous lab order for visit grabbed his paperwork and bottle as well by accident.

## 2015-04-24 NOTE — Progress Notes (Signed)
Pre visit review using our clinic review tool, if applicable. No additional management support is needed unless otherwise documented below in the visit note. 

## 2015-04-25 ENCOUNTER — Telehealth: Payer: Self-pay | Admitting: Medical

## 2015-04-25 LAB — URINE CULTURE: Colony Count: >=100000

## 2015-04-25 LAB — COMPREHENSIVE METABOLIC PANEL
ALT: 77 U/L — ABNORMAL HIGH (ref 0–53)
AST: 64 U/L — ABNORMAL HIGH (ref 0–37)
Albumin: 3.9 g/dL (ref 3.5–5.2)
Alkaline Phosphatase: 109 U/L (ref 39–117)
BUN: 12 mg/dL (ref 6–23)
CO2: 24 mEq/L (ref 19–32)
Calcium: 10.3 mg/dL (ref 8.4–10.5)
Chloride: 102 mEq/L (ref 96–112)
Creatinine, Ser: 0.89 mg/dL (ref 0.40–1.50)
GFR: 108.93 mL/min (ref >60.00)
Glucose, Bld: 103 mg/dL — ABNORMAL HIGH (ref 70–99)
Potassium: 4 mEq/L (ref 3.5–5.1)
Sodium: 135 mEq/L (ref 135–145)
Total Bilirubin: 0.8 mg/dL (ref 0.2–1.2)
Total Protein: 8.3 g/dL (ref 6.0–8.3)

## 2015-04-25 LAB — CBC WITH DIFFERENTIAL/PLATELET
Basophils Absolute: 0 10*3/uL (ref 0.0–0.1)
Basophils Relative: 0.6 % (ref 0.0–3.0)
Eosinophils Absolute: 0.1 10*3/uL (ref 0.0–0.7)
Eosinophils Relative: 0.9 % (ref 0.0–5.0)
HCT: 44.1 % (ref 39.0–52.0)
Hemoglobin: 14.9 g/dL (ref 13.0–17.0)
Lymphocytes Relative: 32.8 % (ref 12.0–46.0)
Lymphs Abs: 2.7 10*3/uL (ref 0.7–4.0)
MCHC: 33.9 g/dL (ref 30.0–36.0)
MCV: 102.4 fl — ABNORMAL HIGH (ref 78.0–100.0)
Monocytes Absolute: 0.9 10*3/uL (ref 0.1–1.0)
Monocytes Relative: 10.5 % (ref 3.0–12.0)
Neutro Abs: 4.5 10*3/uL (ref 1.4–7.7)
Neutrophils Relative %: 55.2 % (ref 43.0–77.0)
Platelets: 271 10*3/uL (ref 150.0–400.0)
RBC: 4.31 Mil/uL (ref 4.22–5.81)
RDW: 12.6 % (ref 11.5–15.5)
WBC: 8.2 10*3/uL (ref 4.0–10.5)

## 2015-04-25 NOTE — Telephone Encounter (Signed)
I called pt cell and left message stating insurance authorized ct of the head. Radiology has left him a message so they can arrange the test. Also reminded him to come upstairs an get his medicine which I have her at office(by accident). Will call back later if he does not call back.

## 2015-04-25 NOTE — Telephone Encounter (Signed)
I called pt and wanted him to get ct of head done today.Pt does not understand why he needs to get test done today. He has to go to funeral. He will let me know if he will get test done now. If not now then advised on Monday. Since he is not coming by today to do CT . Pt won't be in to get his tramadol rx which I accidentally had on my desk.( see last night telephone call). Last night I locked the rx in my desk. This am in light of the fact that pt not coming in today I discussed the med situation with Josh. He recommended giving the prescription to [facility]. I talked with her and she will lock up the rx in her desk.   On Monday will call pt regarding getting the CT of  his head for new onset ha, dizziness and htn. Remind him to pick up rx from our office on Monday as well. Note done 04-25-2015 at 11:02.

## 2015-04-29 ENCOUNTER — Telehealth: Payer: Self-pay | Admitting: Medical

## 2015-04-29 ENCOUNTER — Ambulatory Visit (HOSPITAL_BASED_OUTPATIENT_CLINIC_OR_DEPARTMENT_OTHER): Payer: Medicare Other

## 2015-04-29 ENCOUNTER — Ambulatory Visit (INDEPENDENT_AMBULATORY_CARE_PROVIDER_SITE_OTHER): Payer: Medicare Other | Admitting: *Deleted

## 2015-04-29 DIAGNOSIS — T83511A Infection and inflammatory reaction due to indwelling urethral catheter, initial encounter: Principal | ICD-10-CM

## 2015-04-29 DIAGNOSIS — T8351XA Infection and inflammatory reaction due to indwelling urinary catheter, initial encounter: Secondary | ICD-10-CM | POA: Diagnosis not present

## 2015-04-29 DIAGNOSIS — N390 Urinary tract infection, site not specified: Secondary | ICD-10-CM | POA: Diagnosis not present

## 2015-04-29 MED ORDER — CEFTRIAXONE SODIUM 1 G IJ SOLR: 1.0000 g | INTRAMUSCULAR | Status: DC

## 2015-04-29 MED ORDER — CIPROFLOXACIN HCL 500 MG PO TABS: 500.0000 mg | ORAL_TABLET | Freq: Two times a day (BID) | ORAL | Status: DC

## 2015-04-29 MED ORDER — CEFTRIAXONE SODIUM 1 G IJ SOLR
1.0000 g | Freq: Once | INTRAMUSCULAR | Status: AC
  Administered 2015-04-29: 1 g via INTRAMUSCULAR

## 2015-04-29 NOTE — Telephone Encounter (Signed)
Nursing staff did give pt his tramadol rx when he came in for injection Rocephin.

## 2015-04-29 NOTE — Progress Notes (Signed)
Pre visit review using our clinic review tool, if applicable. No additional management support is needed unless otherwise documented below in the visit note.  Verbal order from [employer], [place] for 1G Rocephin IM.   Patient tolerated injection well.  Medication (Tramadol) given to patient per note:   Mackie Pai, PA-C at 04/29/2015 9:16 AM     Status: Signed        Note to lpn. To make sure pt gets his tramadol rx when he is office today for rocephin.

## 2015-04-29 NOTE — Telephone Encounter (Signed)
Tramadol Rx given to patient at nurse visit.

## 2015-04-29 NOTE — Telephone Encounter (Signed)
Note to lpn. To make sure pt gets his tramadol rx when he is office today for rocephin.

## 2015-04-29 NOTE — Telephone Encounter (Signed)
Note to RN.

## 2015-04-29 NOTE — Telephone Encounter (Signed)
-----   Message from Mackie Pai, PA-C sent at 04/29/2015 3:39 PM EDT -----    Thanks for giving the injection. Did you give him the tramadol prescription.    ----- Message from Bunnie Domino, LPN sent at 3/77/9396 11:47 AM EDT -----    Printed information for [place]. I forgot I will not be here either    ----- Message from Mackie Pai, PA-C sent at 04/29/2015 9:21 AM EDT -----    Just realized that regarding Ronald Wolf that this is my half day. I won't be in when he is here. Pt tramadol rx bottle is in [facility] office. When he was in last time he gave me bottle saying that it made him dizzy. And I accidentally carried it into my office. He needs to get it today. Please make sure when he is in that you talk with Dannielle Karvonen so he can get back his medication.    ----- Message from Mackie Pai, PA-C sent at 04/29/2015 8:51 AM EDT -----    Sent in rx cipro. Let him know about the injection rocephin. Could just be nurse visit. Unless he has unstable vital signs. So if he agrees on injection. Check bp, pulse and temp.

## 2015-04-29 NOTE — Telephone Encounter (Signed)
Pt coming in today for rocephin IM. Will notify Conception Oms RN so we can get him his medication.

## 2015-04-29 NOTE — Telephone Encounter (Signed)
Fever, chills, fatigue. 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 Rx cipro. Advise pt to come in for rocephin im.

## 2015-05-05 ENCOUNTER — Ambulatory Visit: Payer: Medicare Other | Admitting: Neurology

## 2015-05-06 ENCOUNTER — Ambulatory Visit (INDEPENDENT_AMBULATORY_CARE_PROVIDER_SITE_OTHER): Payer: Medicare Other | Admitting: Family Medicine

## 2015-05-06 ENCOUNTER — Encounter: Payer: Self-pay | Admitting: Family Medicine

## 2015-05-06 VITALS — BP 130/72 | HR 82 | Temp 98.7°F | Ht 71.0 in | Wt 162.4 lb

## 2015-05-06 DIAGNOSIS — F17200 Nicotine dependence, unspecified, uncomplicated: Secondary | ICD-10-CM

## 2015-05-06 DIAGNOSIS — M545 Low back pain, unspecified: Secondary | ICD-10-CM

## 2015-05-06 DIAGNOSIS — Z720 Tobacco use: Secondary | ICD-10-CM | POA: Diagnosis not present

## 2015-05-06 DIAGNOSIS — R52 Pain, unspecified: Secondary | ICD-10-CM

## 2015-05-06 DIAGNOSIS — I10 Essential (primary) hypertension: Secondary | ICD-10-CM

## 2015-05-06 DIAGNOSIS — G500 Trigeminal neuralgia: Secondary | ICD-10-CM

## 2015-05-06 DIAGNOSIS — R739 Hyperglycemia, unspecified: Secondary | ICD-10-CM

## 2015-05-06 LAB — CBC
HCT: 40.5 % (ref 39.0–52.0)
Hemoglobin: 13.6 g/dL (ref 13.0–17.0)
MCHC: 33.5 g/dL (ref 30.0–36.0)
MCV: 102.4 fl — ABNORMAL HIGH (ref 78.0–100.0)
Platelets: 265 10*3/uL (ref 150.0–400.0)
RBC: 3.96 Mil/uL — ABNORMAL LOW (ref 4.22–5.81)
RDW: 13.1 % (ref 11.5–15.5)
WBC: 7.1 10*3/uL (ref 4.0–10.5)

## 2015-05-06 LAB — COMPREHENSIVE METABOLIC PANEL
ALT: 51 U/L (ref 0–53)
AST: 58 U/L — ABNORMAL HIGH (ref 0–37)
Albumin: 3.8 g/dL (ref 3.5–5.2)
Alkaline Phosphatase: 113 U/L (ref 39–117)
BUN: 11 mg/dL (ref 6–23)
CO2: 28 mEq/L (ref 19–32)
Calcium: 9.5 mg/dL (ref 8.4–10.5)
Chloride: 107 mEq/L (ref 96–112)
Creatinine, Ser: 0.75 mg/dL (ref 0.40–1.50)
GFR: 132.71 mL/min (ref >60.00)
Glucose, Bld: 112 mg/dL — ABNORMAL HIGH (ref 70–99)
Potassium: 4 mEq/L (ref 3.5–5.1)
Sodium: 140 mEq/L (ref 135–145)
Total Bilirubin: 0.7 mg/dL (ref 0.2–1.2)
Total Protein: 7.7 g/dL (ref 6.0–8.3)

## 2015-05-06 LAB — SEDIMENTATION RATE: Sed Rate: 51 mm/hr — ABNORMAL HIGH (ref 0–22)

## 2015-05-06 MED ORDER — CARBAMAZEPINE 200 MG PO TABS: 200.0000 mg | ORAL_TABLET | Freq: Three times a day (TID) | ORAL | Status: DC

## 2015-05-06 MED ORDER — TRAMADOL HCL 50 MG PO TABS: 50.0000 mg | ORAL_TABLET | Freq: Four times a day (QID) | ORAL | Status: DC | PRN

## 2015-05-06 NOTE — Progress Notes (Signed)
Pre visit review using our clinic review tool, if applicable. No additional management support is needed unless otherwise documented below in the visit note. 

## 2015-05-06 NOTE — Patient Instructions (Signed)
Trigeminal Neuralgia  Trigeminal neuralgia is a nerve disorder that causes sudden attacks of severe facial pain. It is caused by damage to the trigeminal nerve, a major nerve in the face. It is more common in women and in the elderly, although it can also happen in younger patients. Attacks last from a few seconds to several minutes and can occur from a couple of times per year to several times per day. Trigeminal neuralgia can be a very distressing and disabling condition. Surgery may be needed in very severe cases if medical treatment does not give relief.  HOME CARE INSTRUCTIONS    If your caregiver prescribed medication to help prevent attacks, take as directed.   To help prevent attacks:   Chew on the unaffected side of the mouth.   Avoid touching your face.   Avoid blasts of hot or cold air.   Men may wish to grow a beard to avoid having to shave.  SEEK IMMEDIATE MEDICAL CARE IF:   Pain is unbearable and your medicine does not help.   You develop new, unexplained symptoms (problems).   You have problems that may be related to a medication you are taking.  Document Released: 11/26/2000 Document Revised: 02/21/2012 Document Reviewed: 09/26/2009  [employer] Patient Information 2015 [employer]. This information is not intended to replace advice given to you by your health care provider. Make sure you discuss any questions you have with your health care provider.

## 2015-05-06 NOTE — Progress Notes (Signed)
Ronald Wolf  [id number] 31-May-1946 05/06/2015      Progress Note-Follow Up  Subjective  Chief Complaint  Chief Complaint  Patient presents with  . Follow-up    disability     HPI  Patient is a 69 y.o. male in today for routine medical care. Patient is in today for numerous reasons. He is having worsening pain in his cheek secondary to his trigeminal neuralgia. Medications he needs to take cause sedation and have less affected his work. At this time he is unable to drive for continue his previous work position. Has been having back pain mostly in the lower back. Initially responded to chiropractic care but now pain is persistent and there is a radicular element. He reports poor appetite but denies nausea vomiting, diarrhea or change in bowel habits. Needs refill on his Tegretol and his tramadol to manage his pain. Denies CP/palp/SOB/HA/congestion/fevers/GI or GU c/o. Taking meds as prescribed  Past Medical History  Diagnosis Date  . Depression   . Eating disorder   . Hepatitis C   . Hypertension   . Lesion of liver 04/01/2013  . Atherosclerosis of arteries 05/13/2013  . Medial knee pain 09/26/2013  . Abdominal pain, other specified site 09/26/2013  . Cough 09/26/2013  . Palpitations 06/21/2014  . Loss of weight 06/21/2014    Past Surgical History  Procedure Laterality Date  . 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 Prostate cancer Neg Hx   . Colon cancer Neg Hx   . Breast cancer Neg Hx   . Heart disease Neg Hx   . Diabetes Mother   . Hypertension Neg Hx   . Healthy Sister     x 2  . Cancer Brother     stomach    History   Social History  . Marital Status: Single    Spouse Name: N/A  . Number of Children: N/A  . Years of Education: N/A   Occupational History  . Not on file.   Social History Main Topics  . Smoking status: Current Every Day Smoker -- 1.00 packs/day  . Smokeless tobacco: Never Used  . Alcohol Use:  Yes  . Drug Use: Yes    Special: Marijuana  . Sexual Activity: Not on file   Other Topics Concern  . Not on file   Social History Narrative    Current Outpatient Prescriptions on File Prior to Visit  Medication Sig Dispense Refill  . albuterol (PROVENTIL HFA;VENTOLIN HFA) 108 (90 BASE) MCG/ACT inhaler Inhale 1-2 puffs into the lungs every 6 (six) hours as needed for wheezing or shortness of breath. 1 Inhaler 0  . amLODipine (NORVASC) 10 MG tablet Take 1 tablet (10 mg total) by mouth daily. 30 tablet 5  . buPROPion (WELLBUTRIN XL) 150 MG 24 hr tablet Take 1 tablet (150 mg total) by mouth daily. 30 tablet 1  . carbamazepine (TEGRETOL) 200 MG tablet Take 1 tablet (200 mg total) by mouth 3 (three) times daily. 90 tablet 0  . traMADol (ULTRAM) 50 MG tablet Take 1 tablet (50 mg total) by mouth every 6 (six) hours as needed. 30 tablet 0  . fish oil-omega-3 fatty acids 1000 MG capsule Take 2 g by mouth daily.    Marland Kitchen losartan (COZAAR) 50 MG tablet Take 1 tablet (50 mg total) by mouth daily. 30 tablet 1   No current facility-administered medications on file prior to visit.  Allergies  Allergen Reactions  . Iohexol      Desc: pt broke out in hives years ago from iv contrast. he was given 50mg  benadryl po, 1 hr prior to ct and did fine today w/o complications.  JB     Review of Systems  Review of Systems  Constitutional: Positive for malaise/fatigue. Negative for fever.  HENT: Negative for congestion.   Eyes: Positive for redness. Negative for discharge.  Respiratory: Negative for shortness of breath.   Cardiovascular: Negative for chest pain, palpitations and leg swelling.  Gastrointestinal: Positive for nausea. Negative for abdominal pain and diarrhea.  Genitourinary: Negative for dysuria.  Musculoskeletal: Positive for myalgias, back pain, falls and neck pain.  Skin: Negative for rash.  Neurological: Positive for tingling. Negative for loss of consciousness and headaches.    Endo/Heme/Allergies: Negative for polydipsia.  Psychiatric/Behavioral: Negative for depression and suicidal ideas. The patient is not nervous/anxious and does not have insomnia.     Objective  BP 174/82 mmHg  Pulse 82  Temp(Src) 98.7 F (37.1 C) (Oral)  Ht 5\' 11"  (1.803 m)  Wt 162 lb 6 oz (73.653 kg)  BMI 22.66 kg/m2  SpO2 98%  Physical Exam  Physical Exam  Constitutional: He is oriented to person, place, and time and well-developed, well-nourished, and in no distress. No distress.  HENT:  Head: Normocephalic and atraumatic.  Eyes: Conjunctivae are normal.  Neck: Neck supple. No thyromegaly present.  Cardiovascular: Normal rate, regular rhythm and normal heart sounds.   No murmur heard. Pulmonary/Chest: Effort normal and breath sounds normal. No respiratory distress.  Abdominal: He exhibits no distension and no mass. There is no tenderness.  Musculoskeletal: He exhibits no edema.  Neurological: He is alert and oriented to person, place, and time.  Skin: Skin is warm.  Psychiatric: Memory, affect and judgment normal.    Lab Results  Component Value Date   TSH 1.394 06/28/2014   Lab Results  Component Value Date   WBC 8.2 04/24/2015   HGB 14.9 04/24/2015   HCT 44.1 04/24/2015   MCV 102.4* 04/24/2015   PLT 271.0 04/24/2015   Lab Results  Component Value Date   CREATININE 0.89 04/24/2015   BUN 12 04/24/2015   NA 135 04/24/2015   K 4.0 04/24/2015   CL 102 04/24/2015   CO2 24 04/24/2015   Lab Results  Component Value Date   ALT 77* 04/24/2015   AST 64* 04/24/2015   ALKPHOS 109 04/24/2015   BILITOT 0.8 04/24/2015   Lab Results  Component Value Date   CHOL 159 10/25/2014   Lab Results  Component Value Date   HDL 60 10/25/2014   Lab Results  Component Value Date   LDLCALC 77 10/25/2014   Lab Results  Component Value Date   TRIG 111 10/25/2014   Lab Results  Component Value Date   CHOLHDL 2.7 10/25/2014     Assessment & Plan  HTN  (hypertension) Well controlled, no changes to meds. Encouraged heart healthy diet such as the DASH diet and exercise as tolerated. Improved on recheck   Trigeminal neuralgia Worsening pain, will increase medications and is referred back to neurology due to increased pain   Smoking Encouraged complete cessation. Discussed need to quit as relates to risk of numerous cancers, cardiac and pulmonary disease as well as neurologic complications. Counseled for greater than 3 minutes   Hyperglycemia  minimize simple carbs. Increase exercise as tolerated.   Pain Low back pain, referred to ortho for evaluation. Encouraged moist  heat and gentle stretching as tolerated. May try NSAIDs and prescription meds as directed and report if symptoms worsen or seek immediate care

## 2015-05-07 ENCOUNTER — Telehealth: Payer: Self-pay

## 2015-05-07 NOTE — Telephone Encounter (Signed)
-----   Message from Mosie Lukes, MD sent at 05/06/2015 11:07 PM EDT ----- No acute concerns in labwork, no changes

## 2015-05-07 NOTE — Telephone Encounter (Signed)
Pt notified verbalized understanding. No questions or concerns at this time 

## 2015-05-12 ENCOUNTER — Encounter: Payer: Self-pay | Admitting: Family Medicine

## 2015-05-12 NOTE — Assessment & Plan Note (Signed)
minimize simple carbs. Increase exercise as tolerated.  

## 2015-05-12 NOTE — Assessment & Plan Note (Signed)
Low back pain, referred to ortho for evaluation. Encouraged moist heat and gentle stretching as tolerated. May try NSAIDs and prescription meds as directed and report if symptoms worsen or seek immediate care

## 2015-05-12 NOTE — Assessment & Plan Note (Signed)
Well controlled, no changes to meds. Encouraged heart healthy diet such as the DASH diet and exercise as tolerated. Improved on recheck 

## 2015-05-12 NOTE — Assessment & Plan Note (Signed)
Encouraged complete cessation. Discussed need to quit as relates to risk of numerous cancers, cardiac and pulmonary disease as well as neurologic complications. Counseled for greater than 3 minutes 

## 2015-05-12 NOTE — Assessment & Plan Note (Signed)
Worsening pain, will increase medications and is referred back to neurology due to increased pain

## 2015-05-15 ENCOUNTER — Telehealth: Payer: Self-pay | Admitting: Neurology

## 2015-05-15 ENCOUNTER — Ambulatory Visit: Payer: Self-pay | Admitting: Neurology

## 2015-05-15 NOTE — Telephone Encounter (Signed)
This patient did not show for a new patient appointment today. 

## 2015-05-20 ENCOUNTER — Telehealth: Payer: Self-pay | Admitting: Family Medicine

## 2015-05-20 ENCOUNTER — Encounter: Payer: Self-pay | Admitting: Neurology

## 2015-05-20 NOTE — Telephone Encounter (Signed)
Caller name: Inez Catalina from [employer] for short term disability  Call back number: [phone] ext. [phone] fax# [phone]    Reason for call:  Requesting additional office notes to support pt being out of work.

## 2015-05-21 NOTE — Telephone Encounter (Signed)
Requested OV notes faxed successfully to [facility] [place] for short term disability at the fax number provided below. JG//CMA

## 2015-05-23 NOTE — Telephone Encounter (Signed)
Received call from Perry Hall at [employer] regarding pts' disability claim. She would like to have a letter stating that patient is totally disabled starting 05/06/2015 for 1-2 months. She would also like to have the exact reason as to why. This needs to be faxed to her at [phone]. Please advise. JG/CMA

## 2015-05-28 ENCOUNTER — Encounter: Payer: Self-pay | Admitting: Neurology

## 2015-05-28 ENCOUNTER — Ambulatory Visit (INDEPENDENT_AMBULATORY_CARE_PROVIDER_SITE_OTHER): Payer: Medicare Other | Admitting: Neurology

## 2015-05-28 VITALS — BP 158/98 | HR 88 | Ht 71.0 in | Wt 162.4 lb

## 2015-05-28 DIAGNOSIS — G500 Trigeminal neuralgia: Secondary | ICD-10-CM | POA: Diagnosis not present

## 2015-05-28 DIAGNOSIS — Z5181 Encounter for therapeutic drug level monitoring: Secondary | ICD-10-CM

## 2015-05-28 MED ORDER — GABAPENTIN 300 MG PO CAPS: ORAL_CAPSULE | ORAL | Status: DC

## 2015-05-28 NOTE — Patient Instructions (Signed)
Trigeminal Neuralgia  Trigeminal neuralgia is a nerve disorder that causes sudden attacks of severe facial pain. It is caused by damage to the trigeminal nerve, a major nerve in the face. It is more common in women and in the elderly, although it can also happen in younger patients. Attacks last from a few seconds to several minutes and can occur from a couple of times per year to several times per day. Trigeminal neuralgia can be a very distressing and disabling condition. Surgery may be needed in very severe cases if medical treatment does not give relief.  HOME CARE INSTRUCTIONS    If your caregiver prescribed medication to help prevent attacks, take as directed.   To help prevent attacks:   Chew on the unaffected side of the mouth.   Avoid touching your face.   Avoid blasts of hot or cold air.   Men may wish to grow a beard to avoid having to shave.  SEEK IMMEDIATE MEDICAL CARE IF:   Pain is unbearable and your medicine does not help.   You develop new, unexplained symptoms (problems).   You have problems that may be related to a medication you are taking.  Document Released: 11/26/2000 Document Revised: 02/21/2012 Document Reviewed: 09/26/2009  [employer] Patient Information 2015 [employer]. This information is not intended to replace advice given to you by your health care provider. Make sure you discuss any questions you have with your health care provider.

## 2015-05-28 NOTE — Progress Notes (Signed)
Reason for visit: Trigeminal neuralgia  Referring physician: Dr. Erick Alley is a 69 y.o. male  History of present illness:  Ronald Wolf is a 69 year old right-handed black male with a history of trigeminal neuralgia on the left V2 distribution since 1999. Over the last 2 years, he indicates that the symptoms have gradually gotten much worse. The patient is having episodes of severe pain at this point that may be induced by chewing or talking or with light touch on the face. He has not been able to shave on a regular basis, and he is losing weight because he cannot eat well. He is able to rest at night if he is able to position himself in the proper position. He denies any numbness or weakness on the face, arms, or legs. He does note some gait instability. He denies issues controlling the bowels or the bladder. In 2013, he had MRI evaluation of the brain that was unremarkable. This was reviewed on line. The patient is sent to this office for further evaluation.  Past Medical History  Diagnosis Date  . Depression   . Eating disorder   . Hepatitis C   . Hypertension   . Lesion of liver 04/01/2013  . Atherosclerosis of arteries 05/13/2013  . Medial knee pain 09/26/2013  . Abdominal pain, other specified site 09/26/2013  . Cough 09/26/2013  . Palpitations 06/21/2014  . Loss of weight 06/21/2014    Past Surgical History  Procedure Laterality Date  . 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 Prostate cancer Neg Hx   . Colon cancer Neg Hx   . Breast cancer Neg Hx   . Heart disease Neg Hx   . Hypertension Neg Hx   . Diabetes Mother   . Healthy Sister     x 2  . Cancer Brother     stomach  . Dementia Father     Social history:  reports that he has been smoking Cigarettes.  He has been smoking about 1.00 pack per day. He has never used smokeless tobacco. He reports that he drinks alcohol. He reports that he does not use illicit  drugs.  Medications:  Prior to Admission medications   Medication Sig Start Date End Date Taking? Authorizing Provider  albuterol (PROVENTIL HFA;VENTOLIN HFA) 108 (90 BASE) MCG/ACT inhaler Inhale 1-2 puffs into the lungs every 6 (six) hours as needed for wheezing or shortness of breath. 06/21/14  Yes Wandra Arthurs, MD  amLODipine (NORVASC) 10 MG tablet Take 1 tablet (10 mg total) by mouth daily. 03/27/15  Yes Edward Saguier, PA-C  buPROPion (WELLBUTRIN XL) 150 MG 24 hr tablet Take 1 tablet (150 mg total) by mouth daily. 04/24/15  Yes Edward Saguier, PA-C  carbamazepine (TEGRETOL) 200 MG tablet Take 1 tablet (200 mg total) by mouth 3 (three) times daily. 05/06/15  Yes Mosie Lukes, MD  fish oil-omega-3 fatty acids 1000 MG capsule Take 2 g by mouth daily.   Yes Historical Provider, MD  losartan (COZAAR) 50 MG tablet Take 1 tablet (50 mg total) by mouth daily. 04/24/15  Yes Edward Saguier, PA-C  predniSONE (DELTASONE) 5 MG tablet Take 5 mg by mouth daily with breakfast.   Yes Historical Provider, MD  traMADol (ULTRAM) 50 MG tablet Take 1 tablet (50 mg total) by mouth every 6 (six) hours as needed. 05/06/15  Yes Mosie Lukes, MD  gabapentin (NEURONTIN)  300 MG capsule One capsule twice a day for 2 weeks, then take one capsule three times a day 05/28/15   Kathrynn Ducking, MD      Allergies  Allergen Reactions  . Iohexol      Desc: pt broke out in hives years ago from iv contrast. he was given 50mg  benadryl po, 1 hr prior to ct and did fine today w/o complications.  JB     ROS:  Out of a complete 14 system review of symptoms, the patient complains only of the following symptoms, and all other reviewed systems are negative.  Weight loss, fatigue Blurred vision, double vision, eye pain Cough, wheezing Hearing loss Feeling cold, increased thirst Joint pain Impotence Runny nose Memory loss, confusion, headache, weakness, slurred speech, dizziness Depression, too much sleep, decreased energy,  change in appetite, racing thoughts  Blood pressure 158/98, pulse 88, height 5\' 11"  (1.803 m), weight 162 lb 6.4 oz (73.664 kg).  Physical Exam  General: The patient is alert and cooperative at the time of the examination.  Eyes: Pupils are equal, round, and reactive to light. Discs are flat bilaterally.  Neck: The neck is supple, no carotid bruits are noted.  Respiratory: The respiratory examination is clear.  Cardiovascular: The cardiovascular examination reveals a regular rate and rhythm, no obvious murmurs or rubs are noted.  Skin: Extremities are without significant edema.  Neurologic Exam  Mental status: The patient is alert and oriented x 3 at the time of the examination. The patient has apparent normal recent and remote memory, with an apparently normal attention span and concentration ability.  Cranial nerves: Facial symmetry is present. There is good sensation of the face to pinprick and soft touch bilaterally. The strength of the facial muscles and the muscles to head turning and shoulder shrug are normal bilaterally. Speech is well enunciated, no aphasia or dysarthria is noted. Extraocular movements are full. Visual fields are full. The tongue is midline, and the patient has symmetric elevation of the soft palate. No obvious hearing deficits are noted.  Motor: The motor testing reveals 5 over 5 strength of all 4 extremities. Good symmetric motor tone is noted throughout.  Sensory: Sensory testing is intact to pinprick, soft touch, vibration sensation, and position sense on all 4 extremities. No evidence of extinction is noted.  Coordination: Cerebellar testing reveals good finger-nose-finger and heel-to-shin bilaterally.  Gait and station: Gait is normal. Tandem gait is slightly unsteady. Romberg is negative. No drift is seen.  Reflexes: Deep tendon reflexes are symmetric and normal bilaterally. Toes are downgoing bilaterally.   Assessment/Plan:  1. Left trigeminal  neuralgia, V2 distribution  The patient is having severe pain at this point. A trial on prednisone previously did not help. There appears to be a discrepancy on how he is taking his carbamazepine. The records indicate he takes 200 mg 3 times daily, but the patient indicates that he is taking 3 chewable tablets 4 times a day as needed. The patient will go for blood work today for the carbamazepine level. Gabapentin will be added to the regimen. A note was written to keep him out of work for the next 90 days. If the medications are not effective, the patient will be referred to [facility] for gamma knife therapy.  Jill Alexanders MD 05/28/2015 7:22 PM  [facility] [address] [place], [place] [address]  Phone [phone] Fax [phone]

## 2015-05-29 ENCOUNTER — Telehealth: Payer: Self-pay | Admitting: Neurology

## 2015-05-29 LAB — CARBAMAZEPINE LEVEL, TOTAL: Carbamazepine Lvl: 6.4 ug/mL (ref 4.0–12.0)

## 2015-05-29 NOTE — Telephone Encounter (Signed)
I called the patient. The carbamazepine level is in the therapeutic range. It appears that the patient is taking the 100 mg carbamazepine tablets, taking the medication on an as-needed basis. I indicated that this is not a when necessary medication. The patient is to begin taking 200 mg 3 times a day and stay on the medication. Gabapentin will be added to this. We will need to recheck the carbamazepine level in the future, it is not clear that the patient was actually taking the medication daily.

## 2015-05-30 ENCOUNTER — Telehealth: Payer: Self-pay | Admitting: Family Medicine

## 2015-05-30 NOTE — Telephone Encounter (Signed)
Spoke to this patient and his appointment has nothing to do with a brain scan.  He said it is suppose to just be a followup from his last appt. In May for Trigeminal neuralgia.  He is confused because the appts. Been changed and not sure what the deal is??

## 2015-05-30 NOTE — Telephone Encounter (Signed)
-----   Message from Mosie Lukes, MD sent at 05/29/2015  5:45 PM EDT ----- Regarding: FW: Appt for pt Can you check what he is after. There is no brain scan in the computer to review that I see, he did recently see [facility] and maybe they did one out side of the system if that is the case he should follow that up with him since I do not have it. ----- Message -----    From: Margot Ables    Sent: 05/29/2015   8:43 AM      To: Mosie Lukes, MD Subject: Appt for pt                                    Patient called back to reschedule appt. Currently scheduled for 6/21 at 4:45pm for a follow up on a brain scan. It is over a month past that date to find another follow up appt. Can I use one of the "Unavailable - Provider Requested" spots on 6/21 or another day for him? I thought it might be something that needed follow up on at the indicated f/u interval. Thanks!

## 2015-05-31 NOTE — Telephone Encounter (Signed)
I need him back in follow up just to assess pain level and document his medical situation for his disability case. They will turn it down if he does not have documentation. He should come in some time in the next month

## 2015-06-02 NOTE — Telephone Encounter (Signed)
Patient has appointment this week June 05, 2015.

## 2015-06-03 ENCOUNTER — Ambulatory Visit: Payer: Medicare Other | Admitting: Family Medicine

## 2015-06-04 ENCOUNTER — Encounter: Payer: Self-pay | Admitting: Family Medicine

## 2015-06-04 NOTE — Telephone Encounter (Signed)
I have written his letterjessica

## 2015-06-05 ENCOUNTER — Ambulatory Visit (INDEPENDENT_AMBULATORY_CARE_PROVIDER_SITE_OTHER): Payer: Medicare Other | Admitting: Family Medicine

## 2015-06-05 ENCOUNTER — Ambulatory Visit (HOSPITAL_BASED_OUTPATIENT_CLINIC_OR_DEPARTMENT_OTHER)
Admission: RE | Admit: 2015-06-05 | Discharge: 2015-06-05 | Disposition: A | Discharge status: Home / Self Care | Payer: Medicare Other | Source: Ambulatory Visit | Attending: Family Medicine | Admitting: Family Medicine

## 2015-06-05 ENCOUNTER — Encounter: Payer: Self-pay | Admitting: Family Medicine

## 2015-06-05 VITALS — BP 132/82 | HR 95 | Temp 98.3°F | Ht 71.0 in | Wt 159.5 lb

## 2015-06-05 DIAGNOSIS — H547 Unspecified visual loss: Secondary | ICD-10-CM

## 2015-06-05 DIAGNOSIS — R739 Hyperglycemia, unspecified: Secondary | ICD-10-CM | POA: Diagnosis not present

## 2015-06-05 DIAGNOSIS — M5440 Lumbago with sciatica, unspecified side: Secondary | ICD-10-CM | POA: Diagnosis not present

## 2015-06-05 DIAGNOSIS — M5116 Intervertebral disc disorders with radiculopathy, lumbar region: Secondary | ICD-10-CM | POA: Diagnosis not present

## 2015-06-05 DIAGNOSIS — F329 Major depressive disorder, single episode, unspecified: Secondary | ICD-10-CM

## 2015-06-05 DIAGNOSIS — F419 Anxiety disorder, unspecified: Principal | ICD-10-CM

## 2015-06-05 DIAGNOSIS — Z720 Tobacco use: Secondary | ICD-10-CM

## 2015-06-05 DIAGNOSIS — M545 Low back pain: Secondary | ICD-10-CM | POA: Diagnosis present

## 2015-06-05 DIAGNOSIS — G500 Trigeminal neuralgia: Secondary | ICD-10-CM

## 2015-06-05 DIAGNOSIS — R002 Palpitations: Secondary | ICD-10-CM

## 2015-06-05 DIAGNOSIS — F17200 Nicotine dependence, unspecified, uncomplicated: Secondary | ICD-10-CM

## 2015-06-05 DIAGNOSIS — F418 Other specified anxiety disorders: Secondary | ICD-10-CM

## 2015-06-05 DIAGNOSIS — F32A Depression, unspecified: Secondary | ICD-10-CM

## 2015-06-05 DIAGNOSIS — I10 Essential (primary) hypertension: Secondary | ICD-10-CM

## 2015-06-05 MED ORDER — TRAMADOL HCL 50 MG PO TABS
50.0000 mg | ORAL_TABLET | Freq: Four times a day (QID) | ORAL | Status: DC | PRN
Start: 2015-06-05 — End: 2015-09-05

## 2015-06-05 MED ORDER — BUPROPION HCL ER (XL) 150 MG PO TB24: 150.0000 mg | ORAL_TABLET | Freq: Every day | ORAL | Status: DC

## 2015-06-05 MED ORDER — CARBAMAZEPINE 200 MG PO TABS: 400.0000 mg | ORAL_TABLET | Freq: Three times a day (TID) | ORAL | Status: DC

## 2015-06-05 NOTE — Patient Instructions (Signed)
Trigeminal Neuralgia  Trigeminal neuralgia is a nerve disorder that causes sudden attacks of severe facial pain. It is caused by damage to the trigeminal nerve, a major nerve in the face. It is more common in women and in the elderly, although it can also happen in younger patients. Attacks last from a few seconds to several minutes and can occur from a couple of times per year to several times per day. Trigeminal neuralgia can be a very distressing and disabling condition. Surgery may be needed in very severe cases if medical treatment does not give relief.  HOME CARE INSTRUCTIONS    If your caregiver prescribed medication to help prevent attacks, take as directed.   To help prevent attacks:   Chew on the unaffected side of the mouth.   Avoid touching your face.   Avoid blasts of hot or cold air.   Men may wish to grow a beard to avoid having to shave.  SEEK IMMEDIATE MEDICAL CARE IF:   Pain is unbearable and your medicine does not help.   You develop new, unexplained symptoms (problems).   You have problems that may be related to a medication you are taking.  Document Released: 11/26/2000 Document Revised: 02/21/2012 Document Reviewed: 09/26/2009  [employer] Patient Information 2015 [employer]. This information is not intended to replace advice given to you by your health care provider. Make sure you discuss any questions you have with your health care provider.

## 2015-06-05 NOTE — Progress Notes (Signed)
Pre visit review using our clinic review tool, if applicable. No additional management support is needed unless otherwise documented below in the visit note. 

## 2015-06-06 LAB — HEMOGLOBIN A1C: Hgb A1c MFr Bld: 4.7 % (ref 4.6–6.5)

## 2015-06-06 NOTE — Telephone Encounter (Signed)
Letter faxed successfully. JG//CMA

## 2015-06-11 ENCOUNTER — Other Ambulatory Visit: Payer: Self-pay

## 2015-06-11 MED ORDER — LOSARTAN POTASSIUM 50 MG PO TABS: 50.0000 mg | ORAL_TABLET | Freq: Every day | ORAL | Status: DC

## 2015-06-18 ENCOUNTER — Encounter: Payer: Self-pay | Admitting: Family Medicine

## 2015-06-18 DIAGNOSIS — M545 Low back pain, unspecified: Secondary | ICD-10-CM | POA: Insufficient documentation

## 2015-06-18 DIAGNOSIS — H547 Unspecified visual loss: Secondary | ICD-10-CM | POA: Insufficient documentation

## 2015-06-18 NOTE — Progress Notes (Signed)
Ronald Wolf  [id number] [birth year] 06/18/2015      Progress Note-Follow Up  Subjective  Chief Complaint  Chief Complaint  Patient presents with  . Follow-up    HPI  Patient is a 69 y.o. male in today for routine medical care. Patient is in today for follow-up. Continues to struggle with significant trigeminal neuralgia pain and does not know whether he will be able to return to work. At this point neurology is keeping him out of work total October as a work on his pain level. He is also noting significant low back pain and radicular symptoms down both legs right worse than left and worse with ambulation. No incontinence. No numbness or tingling in the legs. Is using tramadol and Tegretol for his trigeminal neuralgia with only partial response. No recent illness. Denies CP/palp/SOB/HA/congestion/fevers/GI or GU c/o. Taking meds as prescribed  Past Medical History  Diagnosis Date  . Depression   . Eating disorder   . Hepatitis C   . Hypertension   . Lesion of liver 04/01/2013  . Atherosclerosis of arteries 05/13/2013  . Medial knee pain 09/26/2013  . Abdominal pain, other specified site 09/26/2013  . Cough 09/26/2013  . Palpitations 06/21/2014  . Loss of weight 06/21/2014    Past Surgical History  Procedure Laterality Date  . 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 Prostate cancer Neg Hx   . Colon cancer Neg Hx   . Breast cancer Neg Hx   . Heart disease Neg Hx   . Hypertension Neg Hx   . Diabetes Mother   . Healthy Sister     x 2  . Cancer Brother     stomach  . Dementia Father     History   Social History  . Marital Status: Single    Spouse Name: N/A  . Number of Children: 6  . Years of Education: 14   Occupational History  . Not on file.   Social History Main Topics  . Smoking status: Current Every Day Smoker -- 1.00 packs/day    Types: Cigarettes  . Smokeless tobacco: Never Used  . Alcohol Use: 0.0 oz/week      0 Standard drinks or equivalent per week     Comment: 1/2 pint daily  . Drug Use: No  . Sexual Activity: Not on file   Other Topics Concern  . Not on file   Social History Narrative   Patient drinks caffeine occasionally.   Patient is right handed.    Current Outpatient Prescriptions on File Prior to Visit  Medication Sig Dispense Refill  . albuterol (PROVENTIL HFA;VENTOLIN HFA) 108 (90 BASE) MCG/ACT inhaler Inhale 1-2 puffs into the lungs every 6 (six) hours as needed for wheezing or shortness of breath. 1 Inhaler 0  . amLODipine (NORVASC) 10 MG tablet Take 1 tablet (10 mg total) by mouth daily. 30 tablet 5  . fish oil-omega-3 fatty acids 1000 MG capsule Take 2 g by mouth daily.    Marland Kitchen gabapentin (NEURONTIN) 300 MG capsule One capsule twice a day for 2 weeks, then take one capsule three times a day 90 capsule 3  . predniSONE (DELTASONE) 5 MG tablet Take 5 mg by mouth daily with breakfast.     No current facility-administered medications on file prior to visit.    Allergies  Allergen Reactions  . Iohexol      Desc: pt broke out in  hives years ago from iv contrast. he was given 50mg  benadryl po, 1 hr prior to ct and did fine today w/o complications.  JB     Review of Systems  Review of Systems  Constitutional: Negative for fever and malaise/fatigue.  HENT: Negative for congestion.   Eyes: Negative for discharge.  Respiratory: Negative for shortness of breath.   Cardiovascular: Negative for chest pain, palpitations and leg swelling.  Gastrointestinal: Negative for nausea, abdominal pain and diarrhea.  Genitourinary: Negative for dysuria.  Musculoskeletal: Positive for back pain and joint pain. Negative for falls.  Skin: Negative for rash.  Neurological: Positive for tremors and sensory change. Negative for focal weakness, seizures, loss of consciousness and headaches.  Endo/Heme/Allergies: Negative for polydipsia.  Psychiatric/Behavioral: Negative for depression and  suicidal ideas. The patient is not nervous/anxious and does not have insomnia.     Objective  BP 132/82 mmHg  Pulse 95  Temp(Src) 98.3 F (36.8 C) (Oral)  Ht 5\' 11"  (1.803 m)  Wt 159 lb 8 oz (72.349 kg)  BMI 22.26 kg/m2  SpO2 100%  Physical Exam  Physical Exam  Constitutional: He is oriented to person, place, and time and well-developed, well-nourished, and in no distress. No distress.  HENT:  Head: Normocephalic and atraumatic.  Eyes: Conjunctivae are normal.  Neck: Neck supple. No thyromegaly present.  Cardiovascular: Normal rate, regular rhythm and normal heart sounds.   Pulmonary/Chest: Effort normal and breath sounds normal. No respiratory distress.  Abdominal: Soft. Bowel sounds are normal. He exhibits no distension and no mass. There is no tenderness.  Musculoskeletal: He exhibits no edema.  Pain with palpation over low back at midline and over b/l posterior sacroiliac joints  Neurological: He is alert and oriented to person, place, and time.  Skin: Skin is warm.  Psychiatric: Memory, affect and judgment normal.    Lab Results  Component Value Date   TSH 1.394 06/28/2014   Lab Results  Component Value Date   WBC 7.1 05/06/2015   HGB 13.6 05/06/2015   HCT 40.5 05/06/2015   MCV 102.4* 05/06/2015   PLT 265.0 05/06/2015   Lab Results  Component Value Date   CREATININE 0.75 05/06/2015   BUN 11 05/06/2015   NA 140 05/06/2015   K 4.0 05/06/2015   CL 107 05/06/2015   CO2 28 05/06/2015   Lab Results  Component Value Date   ALT 51 05/06/2015   AST 58* 05/06/2015   ALKPHOS 113 05/06/2015   BILITOT 0.7 05/06/2015   Lab Results  Component Value Date   CHOL 159 10/25/2014   Lab Results  Component Value Date   HDL 60 10/25/2014   Lab Results  Component Value Date   LDLCALC 77 10/25/2014   Lab Results  Component Value Date   TRIG 111 10/25/2014   Lab Results  Component Value Date   CHOLHDL 2.7 10/25/2014     Assessment &  Plan  Smoking Encouraged complete cessation. Discussed need to quit as relates to risk of numerous cancers, cardiac and pulmonary disease as well as neurologic complications. Counseled for greater than 3 minutes  HTN (hypertension) Well controlled, no changes to meds. Encouraged heart healthy diet such as the DASH diet and exercise as tolerated.   Palpitations No recent flares  Lumbago Xray confirms degenerative joint disease, may need to consider referral to neurosurgery or ortho if symptoms worsen. Encouraged moist heat and gentle stretching as tolerated. May try NSAIDs and prescription meds as directed and report if symptoms worsen or  seek immediate care  Decreased visual acuity Referred to opthamology for consideration  Trigeminal neuralgia Has been in a great deal of pain and is following with Dr Jannifer Franklin of neurology. Is out of work for til October 2016 as they work on controlling his pain. Is maintaining on Tegretol and Tramadol

## 2015-06-18 NOTE — Assessment & Plan Note (Signed)
Encouraged complete cessation. Discussed need to quit as relates to risk of numerous cancers, cardiac and pulmonary disease as well as neurologic complications. Counseled for greater than 3 minutes 

## 2015-06-18 NOTE — Assessment & Plan Note (Signed)
Xray confirms degenerative joint disease, may need to consider referral to neurosurgery or ortho if symptoms worsen. Encouraged moist heat and gentle stretching as tolerated. May try NSAIDs and prescription meds as directed and report if symptoms worsen or seek immediate care

## 2015-06-18 NOTE — Assessment & Plan Note (Signed)
Has been in a great deal of pain and is following with Dr Jannifer Franklin of neurology. Is out of work for til October 2016 as they work on controlling his pain. Is maintaining on Tegretol and Tramadol

## 2015-06-18 NOTE — Assessment & Plan Note (Signed)
No recent flares 

## 2015-06-18 NOTE — Assessment & Plan Note (Signed)
Referred to opthamology for consideration

## 2015-06-18 NOTE — Assessment & Plan Note (Signed)
Well controlled, no changes to meds. Encouraged heart healthy diet such as the DASH diet and exercise as tolerated.  °

## 2015-07-01 ENCOUNTER — Telehealth: Payer: Self-pay | Admitting: Family Medicine

## 2015-07-01 NOTE — Telephone Encounter (Signed)
Caller name:betty Relationship to patient:none Can be reached:[phone] ext [phone] Pharmacy:  Reason for call:wants to discuss short term disability papers

## 2015-07-03 NOTE — Telephone Encounter (Signed)
Called and unable to reach by phone. Will try again later.

## 2015-08-20 ENCOUNTER — Telehealth: Payer: Self-pay | Admitting: Family Medicine

## 2015-08-20 DIAGNOSIS — N390 Urinary tract infection, site not specified: Secondary | ICD-10-CM

## 2015-08-20 NOTE — Telephone Encounter (Signed)
Relation to NT:ZGYF Call back [place]: [facility], [place] [phone] (Phone) [phone] (Fax)         Reason for call:  Patient requesting a refill antibiotic patient does not know the name of Rx.

## 2015-08-20 NOTE — Telephone Encounter (Signed)
Pt states he feels he has a UTI would like to have antibiotic filled. Asked pt if he would be able to come in to do a sample so that we can make any treatment changes. Told pt I would forward the msg. Please advise.

## 2015-08-20 NOTE — Telephone Encounter (Signed)
Am willing to give a refill on the Bactrim Ds once he gives [place] a urine sample for a UA and c&s. Confirm what his symtoms are.

## 2015-08-21 ENCOUNTER — Other Ambulatory Visit (INDEPENDENT_AMBULATORY_CARE_PROVIDER_SITE_OTHER): Payer: Medicare Other

## 2015-08-21 DIAGNOSIS — N390 Urinary tract infection, site not specified: Secondary | ICD-10-CM | POA: Diagnosis not present

## 2015-08-21 LAB — URINALYSIS
Bilirubin Urine: NEGATIVE
Hgb urine dipstick: NEGATIVE
Ketones, ur: NEGATIVE
Leukocytes, UA: NEGATIVE
Nitrite: NEGATIVE
Specific Gravity, Urine: 1.01 (ref 1.000–1.030)
Urine Glucose: NEGATIVE
Urobilinogen, UA: 0.2 (ref 0.0–1.0)
pH: 8 (ref 5.0–8.0)

## 2015-08-21 NOTE — Telephone Encounter (Signed)
Patient informed and did schedule lab appt and ordered lab test.

## 2015-08-22 LAB — URINE CULTURE: Colony Count: >=100000

## 2015-08-25 MED ORDER — CIPROFLOXACIN HCL 250 MG PO TABS: 250.0000 mg | ORAL_TABLET | Freq: Two times a day (BID) | ORAL | Status: DC

## 2015-08-25 NOTE — Addendum Note (Signed)
Addended by: Leticia Penna A on: 08/25/2015 05:10 PM   Modules accepted: Orders

## 2015-09-05 ENCOUNTER — Ambulatory Visit (INDEPENDENT_AMBULATORY_CARE_PROVIDER_SITE_OTHER): Payer: Medicare Other | Admitting: Family Medicine

## 2015-09-05 VITALS — BP 138/80 | HR 77 | Temp 98.0°F | Resp 16 | Wt 161.2 lb

## 2015-09-05 DIAGNOSIS — I10 Essential (primary) hypertension: Secondary | ICD-10-CM

## 2015-09-05 DIAGNOSIS — F418 Other specified anxiety disorders: Secondary | ICD-10-CM | POA: Diagnosis not present

## 2015-09-05 DIAGNOSIS — Z23 Encounter for immunization: Secondary | ICD-10-CM | POA: Diagnosis not present

## 2015-09-05 DIAGNOSIS — R739 Hyperglycemia, unspecified: Secondary | ICD-10-CM

## 2015-09-05 DIAGNOSIS — D649 Anemia, unspecified: Secondary | ICD-10-CM

## 2015-09-05 DIAGNOSIS — F17200 Nicotine dependence, unspecified, uncomplicated: Secondary | ICD-10-CM

## 2015-09-05 DIAGNOSIS — Z720 Tobacco use: Secondary | ICD-10-CM

## 2015-09-05 MED ORDER — BENZONATATE 100 MG PO CAPS: 100.0000 mg | ORAL_CAPSULE | Freq: Two times a day (BID) | ORAL | Status: DC | PRN

## 2015-09-05 MED ORDER — TRAMADOL HCL 50 MG PO TABS: 50.0000 mg | ORAL_TABLET | Freq: Four times a day (QID) | ORAL | Status: DC | PRN

## 2015-09-05 MED ORDER — BUPROPION HCL ER (XL) 300 MG PO TB24: 300.0000 mg | ORAL_TABLET | Freq: Every day | ORAL | Status: DC

## 2015-09-05 NOTE — Patient Instructions (Addendum)
Salon Pas or Aspercreme with Lidocaine patches as needed for shoulder    Hypertension Hypertension, commonly called high blood pressure, is when the force of blood pumping through your arteries is too strong. Your arteries are the blood vessels that carry blood from your heart throughout your body. A blood pressure reading consists of a higher number over a lower number, such as 110/72. The higher number (systolic) is the pressure inside your arteries when your heart pumps. The lower number (diastolic) is the pressure inside your arteries when your heart relaxes. Ideally you want your blood pressure below 120/80. Hypertension forces your heart to work harder to pump blood. Your arteries may become narrow or stiff. Having hypertension puts you at risk for heart disease, stroke, and other problems.  RISK FACTORS Some risk factors for high blood pressure are controllable. Others are not.  Risk factors you cannot control include:   Race. You may be at higher risk if you are African American.  Age. Risk increases with age.  Gender. Men are at higher risk than women before age 42 years. After age 17, women are at higher risk than men. Risk factors you can control include:  Not getting enough exercise or physical activity.  Being overweight.  Getting too much fat, sugar, calories, or salt in your diet.  Drinking too much alcohol. SIGNS AND SYMPTOMS Hypertension does not usually cause signs or symptoms. Extremely high blood pressure (hypertensive crisis) may cause headache, anxiety, shortness of breath, and nosebleed. DIAGNOSIS  To check if you have hypertension, your health care provider will measure your blood pressure while you are seated, with your arm held at the level of your heart. It should be measured at least twice using the same arm. Certain conditions can cause a difference in blood pressure between your right and left arms. A blood pressure reading that is higher than normal on one  occasion does not mean that you need treatment. If one blood pressure reading is high, ask your health care provider about having it checked again. TREATMENT  Treating high blood pressure includes making lifestyle changes and possibly taking medicine. Living a healthy lifestyle can help lower high blood pressure. You may need to change some of your habits. Lifestyle changes may include:  Following the DASH diet. This diet is high in fruits, vegetables, and whole grains. It is low in salt, red meat, and added sugars.  Getting at least 2 hours of brisk physical activity every week.  Losing weight if necessary.  Not smoking.  Limiting alcoholic beverages.  Learning ways to reduce stress. If lifestyle changes are not enough to get your blood pressure under control, your health care provider may prescribe medicine. You may need to take more than one. Work closely with your health care provider to understand the risks and benefits. HOME CARE INSTRUCTIONS  Have your blood pressure rechecked as directed by your health care provider.   Take medicines only as directed by your health care provider. Follow the directions carefully. Blood pressure medicines must be taken as prescribed. The medicine does not work as well when you skip doses. Skipping doses also puts you at risk for problems.   Do not smoke.   Monitor your blood pressure at home as directed by your health care provider. SEEK MEDICAL CARE IF:   You think you are having a reaction to medicines taken.  You have recurrent headaches or feel dizzy.  You have swelling in your ankles.  You have trouble with your  vision. SEEK IMMEDIATE MEDICAL CARE IF:  You develop a severe headache or confusion.  You have unusual weakness, numbness, or feel faint.  You have severe chest or abdominal pain.  You vomit repeatedly.  You have trouble breathing. MAKE SURE YOU:   Understand these instructions.  Will watch your  condition.  Will get help right away if you are not doing well or get worse. Document Released: 11/29/2005 Document Revised: 04/15/2014 Document Reviewed: 09/21/2013 [facility] Patient Information 2015 [place], [place]. This information is not intended to replace advice given to you by your health care provider. Make sure you discuss any questions you have with your health care provider.

## 2015-09-05 NOTE — Progress Notes (Signed)
Pre visit review using our clinic review tool, if applicable. No additional management support is needed unless otherwise documented below in the visit note. 

## 2015-09-12 ENCOUNTER — Encounter: Payer: Self-pay | Admitting: Family Medicine

## 2015-09-12 NOTE — Assessment & Plan Note (Signed)
minimize simple carbs. Increase exercise as tolerated.  

## 2015-09-12 NOTE — Progress Notes (Signed)
Subjective:    Patient ID: Ronald Wolf, male    DOB: [birth year], 69 y.o.   MRN: [id number]  Chief Complaint  Patient presents with  . Follow-up    jaw pain    HPI Patient is in today for for follow-up. Continues to struggle with fatigue and has lost his job. An injury his head requiring stitches but that was several months ago. He denies any persistent neurologic complaints. Does note some intermittent left shoulder pain. No falls or recent injury noted. Denies CP/palp/SOB/HA/congestion/fevers/GI or GU c/o. Taking meds as prescribed  Past Medical History  Diagnosis Date  . Depression   . Eating disorder   . Hepatitis C   . Hypertension   . Lesion of liver 04/01/2013  . Atherosclerosis of arteries 05/13/2013  . Medial knee pain 09/26/2013  . Abdominal pain, other specified site 09/26/2013  . Cough 09/26/2013  . Palpitations 06/21/2014  . Loss of weight 06/21/2014    Past Surgical History  Procedure Laterality Date  . 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 Prostate cancer Neg Hx   . Colon cancer Neg Hx   . Breast cancer Neg Hx   . Heart disease Neg Hx   . Hypertension Neg Hx   . Diabetes Mother   . Healthy Sister     x 2  . Cancer Brother     stomach  . Dementia Father     Social History   Social History  . Marital Status: Single    Spouse Name: N/A  . Number of Children: 6  . Years of Education: 14   Occupational History  . Not on file.   Social History Main Topics  . Smoking status: Current Every Day Smoker -- 1.00 packs/day    Types: Cigarettes  . Smokeless tobacco: Never Used  . Alcohol Use: 0.0 oz/week    0 Standard drinks or equivalent per week     Comment: 1/2 pint daily  . Drug Use: No  . Sexual Activity: Not on file   Other Topics Concern  . Not on file   Social History Narrative   Patient drinks caffeine occasionally.   Patient is right handed.    Outpatient Prescriptions Prior to Visit    Medication Sig Dispense Refill  . amLODipine (NORVASC) 10 MG tablet Take 1 tablet (10 mg total) by mouth daily. 30 tablet 5  . carbamazepine (TEGRETOL) 200 MG tablet Take 2 tablets (400 mg total) by mouth 3 (three) times daily. 90 tablet 0  . fish oil-omega-3 fatty acids 1000 MG capsule Take 2 g by mouth daily.    Marland Kitchen losartan (COZAAR) 50 MG tablet Take 1 tablet (50 mg total) by mouth daily. 30 tablet 1  . buPROPion (WELLBUTRIN XL) 150 MG 24 hr tablet Take 1 tablet (150 mg total) by mouth daily. 30 tablet 1  . ciprofloxacin (CIPRO) 250 MG tablet Take 1 tablet (250 mg total) by mouth 2 (two) times daily. 10 tablet 0  . gabapentin (NEURONTIN) 300 MG capsule One capsule twice a day for 2 weeks, then take one capsule three times a day 90 capsule 3  . traMADol (ULTRAM) 50 MG tablet Take 1 tablet (50 mg total) by mouth every 6 (six) hours as needed. 30 tablet 0  . albuterol (PROVENTIL HFA;VENTOLIN HFA) 108 (90 BASE) MCG/ACT inhaler Inhale 1-2 puffs into the lungs every 6 (six) hours as needed for wheezing  or shortness of breath. (Patient not taking: Reported on 09/05/2015) 1 Inhaler 0  . predniSONE (DELTASONE) 5 MG tablet Take 5 mg by mouth daily with breakfast.     No facility-administered medications prior to visit.    Allergies  Allergen Reactions  . Iohexol      Desc: pt broke out in hives years ago from iv contrast. he was given 50mg  benadryl po, 1 hr prior to ct and did fine today w/o complications.  JB     Review of Systems  Constitutional: Negative for fever and malaise/fatigue.  HENT: Negative for congestion.   Eyes: Negative for discharge.  Respiratory: Negative for shortness of breath.   Cardiovascular: Negative for chest pain, palpitations and leg swelling.  Gastrointestinal: Negative for nausea and abdominal pain.  Genitourinary: Negative for dysuria.  Musculoskeletal: Negative for falls.  Skin: Negative for rash.  Neurological: Negative for loss of consciousness and headaches.   Endo/Heme/Allergies: Negative for environmental allergies.  Psychiatric/Behavioral: Negative for depression. The patient is not nervous/anxious.        Objective:    Physical Exam  Constitutional: He is oriented to person, place, and time. He appears well-developed and well-nourished. No distress.  HENT:  Head: Normocephalic and atraumatic.  Nose: Nose normal.  Eyes: Right eye exhibits no discharge. Left eye exhibits no discharge.  Neck: Normal range of motion. Neck supple.  Cardiovascular: Normal rate, regular rhythm and normal heart sounds.   No murmur heard. Pulmonary/Chest: Effort normal and breath sounds normal.  Abdominal: Soft. Bowel sounds are normal. There is no tenderness.  Musculoskeletal: He exhibits no edema.  Neurological: He is alert and oriented to person, place, and time.  Skin: Skin is warm and dry.  Psychiatric: He has a normal mood and affect.  Nursing note and vitals reviewed.   BP 138/80 mmHg  Pulse 77  Temp(Src) 98 F (36.7 C) (Oral)  Resp 16  Wt 161 lb 3.2 oz (73.12 kg)  SpO2 77% Wt Readings from Last 3 Encounters:  09/05/15 161 lb 3.2 oz (73.12 kg)  06/05/15 159 lb 8 oz (72.349 kg)  05/28/15 162 lb 6.4 oz (73.664 kg)     Lab Results  Component Value Date   WBC 7.1 05/06/2015   HGB 13.6 05/06/2015   HCT 40.5 05/06/2015   PLT 265.0 05/06/2015   GLUCOSE 112* 05/06/2015   CHOL 159 10/25/2014   TRIG 111 10/25/2014   HDL 60 10/25/2014   LDLCALC 77 10/25/2014   ALT 51 05/06/2015   AST 58* 05/06/2015   NA 140 05/06/2015   K 4.0 05/06/2015   CL 107 05/06/2015   CREATININE 0.75 05/06/2015   BUN 11 05/06/2015   CO2 28 05/06/2015   TSH 1.394 06/28/2014   PSA 0.01* 11/21/2014   INR 0.93 06/03/2011   HGBA1C 4.7 06/05/2015    Lab Results  Component Value Date   TSH 1.394 06/28/2014   Lab Results  Component Value Date   WBC 7.1 05/06/2015   HGB 13.6 05/06/2015   HCT 40.5 05/06/2015   MCV 102.4* 05/06/2015   PLT 265.0 05/06/2015    Lab Results  Component Value Date   NA 140 05/06/2015   K 4.0 05/06/2015   CO2 28 05/06/2015   GLUCOSE 112* 05/06/2015   BUN 11 05/06/2015   CREATININE 0.75 05/06/2015   BILITOT 0.7 05/06/2015   ALKPHOS 113 05/06/2015   AST 58* 05/06/2015   ALT 51 05/06/2015   PROT 7.7 05/06/2015   ALBUMIN 3.8 05/06/2015  CALCIUM 9.5 05/06/2015   ANIONGAP 13 06/21/2014   GFR 132.71 05/06/2015   Lab Results  Component Value Date   CHOL 159 10/25/2014   Lab Results  Component Value Date   HDL 60 10/25/2014   Lab Results  Component Value Date   LDLCALC 77 10/25/2014   Lab Results  Component Value Date   TRIG 111 10/25/2014   Lab Results  Component Value Date   CHOLHDL 2.7 10/25/2014   Lab Results  Component Value Date   HGBA1C 4.7 06/05/2015       Assessment & Plan:   Problem List Items Addressed This Visit    Smoking   Hyperglycemia    minimize simple carbs. Increase exercise as tolerated.       HTN (hypertension)    Well controlled, no changes to meds. Encouraged heart healthy diet such as the DASH diet and exercise as tolerated.       Anemia    Increase leafy greens, consider increased lean red meat and using cast iron cookware. Continue to monitor, report any concerns       Other Visit Diagnoses    Need for influenza vaccination    -  Primary    Relevant Orders    Flu vaccine HIGH DOSE PF (Fluzone High dose) (Completed)    Depression with anxiety        Relevant Medications    buPROPion (WELLBUTRIN XL) 300 MG 24 hr tablet       I have discontinued Mr. Dault gabapentin, buPROPion, and ciprofloxacin. I am also having him start on buPROPion and benzonatate. Additionally, I am having him maintain his fish oil-omega-3 fatty acids, albuterol, amLODipine, predniSONE, carbamazepine, losartan, and traMADol.  Meds ordered this encounter  Medications  . traMADol (ULTRAM) 50 MG tablet    Sig: Take 1 tablet (50 mg total) by mouth every 6 (six) hours as needed.     Dispense:  30 tablet    Refill:  0  . buPROPion (WELLBUTRIN XL) 300 MG 24 hr tablet    Sig: Take 1 tablet (300 mg total) by mouth daily.    Dispense:  30 tablet    Refill:  3  . benzonatate (TESSALON) 100 MG capsule    Sig: Take 1 capsule (100 mg total) by mouth 2 (two) times daily as needed for cough.    Dispense:  30 capsule    Refill:  1     Penni Homans, MD

## 2015-09-12 NOTE — Assessment & Plan Note (Signed)
Increase leafy greens, consider increased lean red meat and using cast iron cookware. Continue to monitor, report any concerns 

## 2015-09-12 NOTE — Assessment & Plan Note (Signed)
Well controlled, no changes to meds. Encouraged heart healthy diet such as the DASH diet and exercise as tolerated.  °

## 2015-09-22 ENCOUNTER — Ambulatory Visit: Payer: Medicare Other | Admitting: Neurology

## 2015-11-17 ENCOUNTER — Telehealth: Payer: Self-pay | Admitting: *Deleted

## 2015-11-19 ENCOUNTER — Telehealth: Payer: Self-pay | Admitting: Family Medicine

## 2015-11-19 NOTE — Telephone Encounter (Signed)
Received call from [facility] about lidocaine ointment that was requested by the patient and they faxed form 11/11/15. Advised her that the pt would need to discuss with provider.

## 2015-12-02 ENCOUNTER — Telehealth: Payer: Self-pay | Admitting: *Deleted

## 2015-12-02 NOTE — Telephone Encounter (Signed)
Forwarded to [place] to scan/email to medical records. JG//[id number]

## 2016-01-06 ENCOUNTER — Ambulatory Visit: Payer: Medicare Other | Admitting: Family Medicine

## 2016-01-08 NOTE — Telephone Encounter (Signed)
No Information

## 2016-02-26 ENCOUNTER — Encounter (HOSPITAL_COMMUNITY): Payer: Medicare Other

## 2016-03-08 ENCOUNTER — Encounter: Payer: Self-pay | Admitting: Hematology & Oncology

## 2016-03-08 ENCOUNTER — Other Ambulatory Visit (HOSPITAL_BASED_OUTPATIENT_CLINIC_OR_DEPARTMENT_OTHER): Payer: Medicare Other

## 2016-03-08 ENCOUNTER — Ambulatory Visit: Payer: Medicare Other

## 2016-03-08 ENCOUNTER — Ambulatory Visit (HOSPITAL_BASED_OUTPATIENT_CLINIC_OR_DEPARTMENT_OTHER): Payer: Medicare Other | Admitting: Hematology & Oncology

## 2016-03-08 DIAGNOSIS — B1920 Unspecified viral hepatitis C without hepatic coma: Secondary | ICD-10-CM

## 2016-03-08 DIAGNOSIS — R7989 Other specified abnormal findings of blood chemistry: Secondary | ICD-10-CM

## 2016-03-08 LAB — COMPREHENSIVE METABOLIC PANEL
ALT: 43 U/L (ref 0–55)
AST: 42 U/L — ABNORMAL HIGH (ref 5–34)
Albumin: 3.6 g/dL (ref 3.5–5.0)
Alkaline Phosphatase: 79 U/L (ref 40–150)
Anion Gap: 9 mEq/L (ref 3–11)
BUN: 14.2 mg/dL (ref 7.0–26.0)
CO2: 28 mEq/L (ref 22–29)
Calcium: 9.8 mg/dL (ref 8.4–10.4)
Chloride: 108 mEq/L (ref 98–109)
Creatinine: 0.9 mg/dL (ref 0.7–1.3)
EGFR: >90 mL/min/{1.73_m2} (ref >90)
Glucose: 98 mg/dl (ref 70–140)
Potassium: 4.5 mEq/L (ref 3.5–5.1)
Sodium: 145 mEq/L (ref 136–145)
Total Bilirubin: 0.74 mg/dL (ref 0.20–1.20)
Total Protein: 8.2 g/dL (ref 6.4–8.3)

## 2016-03-08 LAB — CBC WITH DIFFERENTIAL (CANCER CENTER ONLY)
BASO#: 0 10*3/uL (ref 0.0–0.2)
BASO%: 0.3 % (ref 0.0–2.0)
EOS%: 1.1 % (ref 0.0–7.0)
Eosinophils Absolute: 0.1 10*3/uL (ref 0.0–0.5)
HCT: 42.1 % (ref 38.7–49.9)
HGB: 14.5 g/dL (ref 13.0–17.1)
LYMPH#: 2.4 10*3/uL (ref 0.9–3.3)
LYMPH%: 33.5 % (ref 14.0–48.0)
MCH: 36.4 pg — ABNORMAL HIGH (ref 28.0–33.4)
MCHC: 34.4 g/dL (ref 32.0–35.9)
MCV: 106 fL — ABNORMAL HIGH (ref 82–98)
MONO#: 1 10*3/uL — ABNORMAL HIGH (ref 0.1–0.9)
MONO%: 13.3 % — ABNORMAL HIGH (ref 0.0–13.0)
NEUT#: 3.7 10*3/uL (ref 1.5–6.5)
NEUT%: 51.8 % (ref 40.0–80.0)
Platelets: 274 10*3/uL (ref 145–400)
RBC: 3.98 10*6/uL — ABNORMAL LOW (ref 4.20–5.70)
RDW: 13.3 % (ref 11.1–15.7)
WBC: 7.1 10*3/uL (ref 4.0–10.0)

## 2016-03-08 LAB — CHCC SATELLITE - SMEAR

## 2016-03-09 LAB — IRON AND TIBC
%SAT: 53 % (ref 20–55)
Iron: 183 ug/dL — ABNORMAL HIGH (ref 42–163)
TIBC: 348 ug/dL (ref 202–409)
UIBC: 164 ug/dL (ref 117–376)

## 2016-03-09 LAB — FERRITIN: Ferritin: 1168 ng/ml — ABNORMAL HIGH (ref 22–316)

## 2016-03-09 LAB — AFP TUMOR MARKER: AFP, Serum, Tumor Marker: 8.5 ng/mL — ABNORMAL HIGH (ref 0.0–8.3)

## 2016-03-09 NOTE — Progress Notes (Signed)
Referral MD  Reason for Referral: Hepatitis C and elevated ferritin  Chief Complaint  Patient presents with  . OTHER    New Patient  : I have no idea why I am here.  HPI: Ronald Wolf is a very nice 70 year old African-American male. He has hepatitis C. He is not sure how he got this. I think he did do some recreational drugs when he was younger. He never has had a Blood transfusion.  He has had routine lab studies done.  Gong back to 2012, a ferritin was 1349. Iron saturation was 40%.  In 2013, the ferritin was 1649. In December of that year, the ferritin was 1944 with iron saturation of 82%.  It was noted that he had hepatitis C back in 2012. Not sure as to why he is never been treated.  In February of this year, some lab work was done. This showed a ferritin of 1290. His iron saturation was 42%. Total iron was 121.  He was referred to the [facility] to evaluate for hemochromatosis.  I must say that I'm never seen an African-American patient with hemochromatosis.  He is still smoking. He is still having some alcoholic beverages.  There is no evidence family who has had cirrhosis. No and his family has had early onset heart disease.  He's had no nausea.  There's been no bleeding.  He's had no obvious change in bowel or bladder habits.  Overall, his performance status is ECOG 1.   Past Medical History  Diagnosis Date  . Depression   . Eating disorder   . Hepatitis C   . Hypertension   . Lesion of liver 04/01/2013  . Atherosclerosis of arteries 05/13/2013  . Medial knee pain 09/26/2013  . Abdominal pain, other specified site 09/26/2013  . Cough 09/26/2013  . Palpitations 06/21/2014  . Loss of weight 06/21/2014  :  Past Surgical History  Procedure Laterality Date  . 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  albuterol (PROVENTIL HFA;VENTOLIN HFA) 108 (90 BASE) MCG/ACT inhaler, Inhale 1-2 puffs into the lungs every  6 (six) hours as needed for wheezing or shortness of breath., Disp: 1 Inhaler, Rfl: 0 .  amLODipine (NORVASC) 10 MG tablet, Take 1 tablet (10 mg total) by mouth daily., Disp: 30 tablet, Rfl: 5 .  benzonatate (TESSALON) 100 MG capsule, Take 1 capsule (100 mg total) by mouth 2 (two) times daily as needed for cough., Disp: 30 capsule, Rfl: 1 .  buPROPion (WELLBUTRIN XL) 300 MG 24 hr tablet, Take 1 tablet (300 mg total) by mouth daily., Disp: 30 tablet, Rfl: 3 .  fish oil-omega-3 fatty acids 1000 MG capsule, Take 2 g by mouth daily., Disp: , Rfl: :  :  Allergies  Allergen Reactions  . Iohexol      Desc: pt broke out in hives years ago from iv contrast. he was given 50mg  benadryl po, 1 hr prior to ct and did fine today w/o complications.  JB   :  Family History  Problem Relation Age of Onset  . Prostate cancer Neg Hx   . Colon cancer Neg Hx   . Breast cancer Neg Hx   . Heart disease Neg Hx   . Hypertension Neg Hx   . Diabetes Mother   . Healthy Sister     x 2  . Cancer Brother     stomach  . Dementia Father   :  Social History   Social History  . Marital Status: Single    Spouse Name: N/A  . Number of Children: 6  . Years of Education: 14   Occupational History  . Not on file.   Social History Main Topics  . Smoking status: Current Every Day Smoker -- 1.00 packs/day    Types: Cigarettes  . Smokeless tobacco: Never Used  . Alcohol Use: 0.0 oz/week    0 Standard drinks or equivalent per week     Comment: 1/2 pint daily  . Drug Use: No  . Sexual Activity: Not on file   Other Topics Concern  . Not on file   Social History Narrative   Patient drinks caffeine occasionally.   Patient is right handed.  :  Pertinent items are noted in HPI.  Exam: @IPVITALS @ Well-developed and well-nourished African-American male. Vital signs temperature of 98.2. Pulse 74. Blood pressure 177/92. Weight is 163 pounds. Head and neck exam shows no ocular or oral lesions. He has no scleral  icterus. He has no adenopathy in the neck. Lungs are clear. Cardiac exam regular rate and rhythm with no murmurs, rubs or bruits. Abdomen is soft. He has good bowel sounds. There is no fluid wave. There is no palpable liver or spleen tip. Back exam shows no tenderness over the spine, ribs or hips. Extremities shows no clubbing, cyanosis or edema. Skin exam shows no rashes, ecchymoses or petechia.    Recent Labs  03/08/16 1355  WBC 7.1  HGB 14.5  HCT 42.1  PLT 274    Recent Labs  03/08/16 1356  NA 145  K 4.5  CO2 28  GLUCOSE 98  BUN 14.2  CREATININE 0.9  CALCIUM 9.8    Blood smear review:  Normochromic and normocytic population of red blood cells. There are no deflated red blood cells. I see no target cells. He has no inclusion bodies. White cells been normal in morphology maturation. He has no hypersegmented polys. He has no immature myeloid or lymphoid forms. Platelets are adequate in number and size. Platelets are well granulated.  Pathology: None     Assessment and Plan:  Ronald Wolf is a 70 year old African-American male. He has an elevated ferritin. He has had this for 4 years. If he had hemachromatosis, I would expect this to be a lot worse.  I had to believe of the elevated ferritin is off from his hepatitis. Ferritin is an acute phase reactant.  Again, I am not sure as why he has not been treated yet. I'm sure once he gets treated and hepatitis goes into remission, his ferritin will improve.  As I stated previously, I never seen hemachromatosis in an African-American patient. This is typically a disease of European Caucasians.  His liver function tests are not all that bad.  I wonder if he needs some kind of liver biopsy to assess for chronic active hepatitis.  From my point of view, I don't think we have to see him back. We will see what his lab work shows.  I spent about 45 minutes with him. He is very nice. It was good talked with him. I answered his  questions.  Laurey Arrow

## 2016-03-10 ENCOUNTER — Other Ambulatory Visit (HOSPITAL_COMMUNITY): Payer: Self-pay | Admitting: Nurse Practitioner

## 2016-03-10 DIAGNOSIS — B1920 Unspecified viral hepatitis C without hepatic coma: Secondary | ICD-10-CM

## 2016-03-10 LAB — HEMOGLOBINOPATHY EVALUATION
HGB C: 0 %
HGB S: 0 %
Hemoglobin A2 Quantitation: 2.3 % (ref 0.7–3.1)
Hemoglobin F Quantitation: 0 % (ref 0.0–2.0)
Hgb A: 97.7 % (ref 94.0–98.0)

## 2016-03-15 LAB — HEMOCHROMATOSIS DNA-PCR(C282Y,H63D)

## 2016-04-08 ENCOUNTER — Ambulatory Visit (HOSPITAL_COMMUNITY)
Admission: RE | Admit: 2016-04-08 | Discharge: 2016-04-08 | Disposition: A | Discharge status: Home / Self Care | Payer: Medicare Other | Source: Ambulatory Visit | Attending: Vascular Surgery | Admitting: Vascular Surgery

## 2016-04-08 ENCOUNTER — Other Ambulatory Visit (HOSPITAL_COMMUNITY): Payer: Self-pay | Admitting: Internal Medicine

## 2016-04-08 DIAGNOSIS — I7789 Other specified disorders of arteries and arterioles: Secondary | ICD-10-CM | POA: Insufficient documentation

## 2016-04-08 DIAGNOSIS — F17200 Nicotine dependence, unspecified, uncomplicated: Secondary | ICD-10-CM | POA: Insufficient documentation

## 2016-04-08 DIAGNOSIS — Z0000 Encounter for general adult medical examination without abnormal findings: Secondary | ICD-10-CM | POA: Diagnosis present

## 2016-05-12 ENCOUNTER — Other Ambulatory Visit (HOSPITAL_COMMUNITY): Payer: Self-pay | Admitting: *Deleted

## 2016-05-12 ENCOUNTER — Encounter (HOSPITAL_COMMUNITY): Payer: Self-pay

## 2016-05-12 ENCOUNTER — Encounter (HOSPITAL_COMMUNITY)
Admission: RE | Admit: 2016-05-12 | Discharge: 2016-05-12 | Disposition: A | Discharge status: Home / Self Care | Payer: Medicare Other | Source: Ambulatory Visit | Attending: Otolaryngology | Admitting: Otolaryngology

## 2016-05-12 DIAGNOSIS — I10 Essential (primary) hypertension: Secondary | ICD-10-CM | POA: Diagnosis not present

## 2016-05-12 DIAGNOSIS — C320 Malignant neoplasm of glottis: Secondary | ICD-10-CM | POA: Diagnosis present

## 2016-05-12 DIAGNOSIS — R490 Dysphonia: Secondary | ICD-10-CM | POA: Diagnosis not present

## 2016-05-12 DIAGNOSIS — J449 Chronic obstructive pulmonary disease, unspecified: Secondary | ICD-10-CM | POA: Diagnosis not present

## 2016-05-12 DIAGNOSIS — F17210 Nicotine dependence, cigarettes, uncomplicated: Secondary | ICD-10-CM | POA: Diagnosis not present

## 2016-05-12 DIAGNOSIS — K219 Gastro-esophageal reflux disease without esophagitis: Secondary | ICD-10-CM | POA: Diagnosis not present

## 2016-05-12 HISTORY — DX: Anxiety disorder, unspecified: F41.9

## 2016-05-12 HISTORY — DX: Gastro-esophageal reflux disease without esophagitis: K21.9

## 2016-05-12 HISTORY — DX: Reserved for inherently not codable concepts without codable children: IMO0001

## 2016-05-12 HISTORY — DX: Unspecified osteoarthritis, unspecified site: M19.90

## 2016-05-12 LAB — CBC
HCT: 43.8 % (ref 39.0–52.0)
Hemoglobin: 13.7 g/dL (ref 13.0–17.0)
MCH: 33.2 pg (ref 26.0–34.0)
MCHC: 31.3 g/dL (ref 30.0–36.0)
MCV: 106.1 fL — ABNORMAL HIGH (ref 78.0–100.0)
Platelets: 360 10*3/uL (ref 150–400)
RBC: 4.13 MIL/uL — ABNORMAL LOW (ref 4.22–5.81)
RDW: 12.3 % (ref 11.5–15.5)
WBC: 9.5 10*3/uL (ref 4.0–10.5)

## 2016-05-12 LAB — COMPREHENSIVE METABOLIC PANEL
ALT: 18 U/L (ref 17–63)
AST: 20 U/L (ref 15–41)
Albumin: 3.7 g/dL (ref 3.5–5.0)
Alkaline Phosphatase: 73 U/L (ref 38–126)
Anion gap: 7 (ref 5–15)
BUN: 17 mg/dL (ref 6–20)
CO2: 26 mmol/L (ref 22–32)
Calcium: 10.2 mg/dL (ref 8.9–10.3)
Chloride: 107 mmol/L (ref 101–111)
Creatinine, Ser: 1.26 mg/dL — ABNORMAL HIGH (ref 0.61–1.24)
GFR calc Af Amer: >60 mL/min (ref >60)
GFR calc non Af Amer: 56 mL/min — ABNORMAL LOW (ref >60)
Glucose, Bld: 93 mg/dL (ref 65–99)
Potassium: 4.6 mmol/L (ref 3.5–5.1)
Sodium: 140 mmol/L (ref 135–145)
Total Bilirubin: 0.7 mg/dL (ref 0.3–1.2)
Total Protein: 7.8 g/dL (ref 6.5–8.1)

## 2016-05-12 NOTE — Pre-Procedure Instructions (Signed)
    Ronald Wolf  05/12/2016      [id number], [place] [address] Phone: [phone] Fax: [phone]    Your procedure is scheduled on 05-14-2016  Friday    Report to [facility] Admitting at 7:30 A.M.    Call this number if you have problems the morning of surgery:  [phone]   Remember:  Do not eat food or drink liquids after midnight.   Take these medicines the morning of surgery with A SIP OF WATER Albuterol inhaler if needed,bupropion(Wellbutrin),Omeprezole(Prilosec),Visine eye drops as needed   Do not wear jewelry,   Do not wear lotions, powders, or perfumes.  You may NOT wear deodorant.  Do not shave 48 hours prior to surgery.  Men may shave face and neck.   Do not bring valuables to the hospital.  [facility] is not responsible for any belongings or valuables.  Contacts, dentures or bridgework may not be worn into surgery.  Leave your suitcase in the car.  After surgery it may be brought to your room.  For patients admitted to the hospital, discharge time will be determined by your treatment team.  Patients discharged the day of surgery will not be allowed to drive home.    Special instructions:  See instructions on CHG showers   Please read over the following fact sheets that you were given. Surgical Site Infection Prevention

## 2016-05-14 ENCOUNTER — Ambulatory Visit (HOSPITAL_COMMUNITY): Payer: Medicare Other | Admitting: Certified Registered Nurse Anesthetist

## 2016-05-14 ENCOUNTER — Encounter (HOSPITAL_COMMUNITY): Payer: Self-pay | Admitting: *Deleted

## 2016-05-14 ENCOUNTER — Encounter (HOSPITAL_COMMUNITY)
Admission: RE | Disposition: A | Discharge status: Home / Self Care | Payer: Self-pay | Source: Ambulatory Visit | Attending: Otolaryngology

## 2016-05-14 ENCOUNTER — Ambulatory Visit (HOSPITAL_COMMUNITY)
Admission: RE | Admit: 2016-05-14 | Discharge: 2016-05-14 | Disposition: A | Discharge status: Home / Self Care | Payer: Medicare Other | Source: Ambulatory Visit | Attending: Otolaryngology | Admitting: Otolaryngology

## 2016-05-14 DIAGNOSIS — F17210 Nicotine dependence, cigarettes, uncomplicated: Secondary | ICD-10-CM | POA: Diagnosis not present

## 2016-05-14 DIAGNOSIS — K219 Gastro-esophageal reflux disease without esophagitis: Secondary | ICD-10-CM | POA: Insufficient documentation

## 2016-05-14 DIAGNOSIS — I10 Essential (primary) hypertension: Secondary | ICD-10-CM | POA: Diagnosis not present

## 2016-05-14 DIAGNOSIS — C320 Malignant neoplasm of glottis: Secondary | ICD-10-CM | POA: Insufficient documentation

## 2016-05-14 DIAGNOSIS — R490 Dysphonia: Secondary | ICD-10-CM | POA: Diagnosis not present

## 2016-05-14 DIAGNOSIS — J449 Chronic obstructive pulmonary disease, unspecified: Secondary | ICD-10-CM | POA: Insufficient documentation

## 2016-05-14 HISTORY — PX: MICROLARYNGOSCOPY WITH CO2 LASER AND EXCISION OF VOCAL CORD LESION: SHX5970

## 2016-05-14 SURGERY — MICROLARYNGOSCOPY WITH CO2 LASER AND EXCISION OF VOCAL CORD LESION
Anesthesia: General

## 2016-05-14 MED ORDER — PROPOFOL 1000 MG/100ML IV EMUL
INTRAVENOUS | Status: AC
  Filled 2016-05-14: qty 200

## 2016-05-14 MED ORDER — HYDROCODONE-ACETAMINOPHEN 5-325 MG PO TABS: 1.0000 | ORAL_TABLET | Freq: Four times a day (QID) | ORAL | Status: DC | PRN

## 2016-05-14 MED ORDER — LACTATED RINGERS IV SOLN
INTRAVENOUS | Status: DC
  Administered 2016-05-14 (×2): via INTRAVENOUS

## 2016-05-14 MED ORDER — ONDANSETRON HCL 4 MG/2ML IJ SOLN
INTRAMUSCULAR | Status: AC
  Filled 2016-05-14: qty 2

## 2016-05-14 MED ORDER — SUGAMMADEX SODIUM 200 MG/2ML IV SOLN
INTRAVENOUS | Status: DC | PRN
  Administered 2016-05-14: 280 mg via INTRAVENOUS

## 2016-05-14 MED ORDER — FENTANYL CITRATE (PF) 250 MCG/5ML IJ SOLN
INTRAMUSCULAR | Status: AC
  Filled 2016-05-14: qty 5

## 2016-05-14 MED ORDER — MEPERIDINE HCL 25 MG/ML IJ SOLN: 6.2500 mg | INTRAMUSCULAR | Status: DC | PRN

## 2016-05-14 MED ORDER — FENTANYL CITRATE (PF) 100 MCG/2ML IJ SOLN: 25.0000 ug | INTRAMUSCULAR | Status: DC | PRN

## 2016-05-14 MED ORDER — ROCURONIUM BROMIDE 50 MG/5ML IV SOLN
INTRAVENOUS | Status: AC
  Filled 2016-05-14: qty 1

## 2016-05-14 MED ORDER — PHENYLEPHRINE 40 MCG/ML (10ML) SYRINGE FOR IV PUSH (FOR BLOOD PRESSURE SUPPORT)
PREFILLED_SYRINGE | INTRAVENOUS | Status: AC
  Filled 2016-05-14: qty 10

## 2016-05-14 MED ORDER — EPINEPHRINE HCL (NASAL) 0.1 % NA SOLN
NASAL | Status: AC
  Filled 2016-05-14: qty 30

## 2016-05-14 MED ORDER — EPINEPHRINE HCL 1 MG/ML IJ SOLN
INTRAMUSCULAR | Status: DC | PRN
  Administered 2016-05-14: 30 mL

## 2016-05-14 MED ORDER — PROMETHAZINE HCL 25 MG/ML IJ SOLN: 6.2500 mg | INTRAMUSCULAR | Status: DC | PRN

## 2016-05-14 MED ORDER — MIDAZOLAM HCL 2 MG/2ML IJ SOLN
INTRAMUSCULAR | Status: AC
  Filled 2016-05-14: qty 2

## 2016-05-14 MED ORDER — ONDANSETRON HCL 4 MG/2ML IJ SOLN
INTRAMUSCULAR | Status: DC | PRN
  Administered 2016-05-14: 4 mg via INTRAVENOUS

## 2016-05-14 MED ORDER — PROPOFOL 10 MG/ML IV BOLUS
INTRAVENOUS | Status: DC | PRN
  Administered 2016-05-14: 40 mg via INTRAVENOUS
  Administered 2016-05-14 (×2): 100 mg via INTRAVENOUS
  Administered 2016-05-14 (×2): 40 mg via INTRAVENOUS

## 2016-05-14 MED ORDER — PROPOFOL 10 MG/ML IV BOLUS
INTRAVENOUS | Status: AC
  Filled 2016-05-14: qty 20

## 2016-05-14 MED ORDER — FENTANYL CITRATE (PF) 100 MCG/2ML IJ SOLN
INTRAMUSCULAR | Status: DC | PRN
  Administered 2016-05-14 (×2): 50 ug via INTRAVENOUS

## 2016-05-14 MED ORDER — PROPOFOL 500 MG/50ML IV EMUL
INTRAVENOUS | Status: DC | PRN
  Administered 2016-05-14: 150 ug/kg/min via INTRAVENOUS

## 2016-05-14 MED ORDER — MIDAZOLAM HCL 2 MG/2ML IJ SOLN: 0.5000 mg | Freq: Once | INTRAMUSCULAR | Status: DC | PRN

## 2016-05-14 MED ORDER — MIDAZOLAM HCL 5 MG/5ML IJ SOLN
INTRAMUSCULAR | Status: DC | PRN
  Administered 2016-05-14: 2 mg via INTRAVENOUS

## 2016-05-14 MED ORDER — 0.9 % SODIUM CHLORIDE (POUR BTL) OPTIME
TOPICAL | Status: DC | PRN
  Administered 2016-05-14: 1000 mL

## 2016-05-14 MED ORDER — ROCURONIUM BROMIDE 100 MG/10ML IV SOLN
INTRAVENOUS | Status: DC | PRN
  Administered 2016-05-14: 40 mg via INTRAVENOUS

## 2016-05-14 MED ORDER — LIDOCAINE HCL 4 % MT SOLN
OROMUCOSAL | Status: DC | PRN
  Administered 2016-05-14: 4 mL via TOPICAL

## 2016-05-14 MED ORDER — DEXAMETHASONE SODIUM PHOSPHATE 10 MG/ML IJ SOLN
INTRAMUSCULAR | Status: DC | PRN
  Administered 2016-05-14: 10 mg via INTRAVENOUS

## 2016-05-14 SURGICAL SUPPLY — 19 items
BANDAGE EYE OVAL (MISCELLANEOUS) ×3 IMPLANT
CANISTER SUCTION 2500CC (MISCELLANEOUS) ×3 IMPLANT
CONT SPEC 4OZ CLIKSEAL STRL BL (MISCELLANEOUS) ×3 IMPLANT
COVER TABLE BACK 60X90 (DRAPES) ×3 IMPLANT
CRADLE DONUT ADULT HEAD (MISCELLANEOUS) ×3 IMPLANT
DRAPE PROXIMA HALF (DRAPES) ×3 IMPLANT
GAUZE SPONGE 4X4 12PLY STRL (GAUZE/BANDAGES/DRESSINGS) ×3 IMPLANT
GLOVE BIO SURGEON STRL SZ7.5 (GLOVE) ×3 IMPLANT
GOWN STRL REUS W/ TWL LRG LVL3 (GOWN DISPOSABLE) ×2 IMPLANT
GOWN STRL REUS W/TWL LRG LVL3 (GOWN DISPOSABLE) ×6
KIT BASIN OR (CUSTOM PROCEDURE TRAY) ×3 IMPLANT
KIT ROOM TURNOVER OR (KITS) ×3 IMPLANT
NS IRRIG 1000ML POUR BTL (IV SOLUTION) ×3 IMPLANT
PAD ARMBOARD 7.5X6 YLW CONV (MISCELLANEOUS) ×3 IMPLANT
PATTIES SURGICAL .5 X3 (DISPOSABLE) ×3 IMPLANT
TOWEL OR 17X24 6PK STRL BLUE (TOWEL DISPOSABLE) ×3 IMPLANT
TUBE CONNECTING 12'X1/4 (SUCTIONS) ×1
TUBE CONNECTING 12X1/4 (SUCTIONS) ×2 IMPLANT
WATER STERILE IRR 1000ML POUR (IV SOLUTION) ×3 IMPLANT

## 2016-05-14 NOTE — H&P (Signed)
Ronald Wolf is an 70 y.o. male.   Chief Complaint: hoarseness, right vocal fold lesion HPI: 70 year old male with hoarseness for the past couple of months that has worsened.  He has had no pain but does have some trouble swallowing.  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  Presents for surgical biopsy.  Past Medical History  Diagnosis Date  . Depression   . Eating disorder   . Hepatitis C   . Hypertension   . Lesion of liver 04/01/2013  . Atherosclerosis of arteries 05/13/2013  . Medial knee pain 09/26/2013  . Abdominal pain, other specified site 09/26/2013  . Cough 09/26/2013  . Palpitations 06/21/2014  . Loss of weight 06/21/2014  . Shortness of breath dyspnea     with exertion  . Anxiety   . GERD (gastroesophageal reflux disease)   . Arthritis     Past Surgical History  Procedure Laterality Date  . 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 Prostate cancer Neg Hx   . Colon cancer Neg Hx   . Breast cancer Neg Hx   . Heart disease Neg Hx   . Hypertension Neg Hx   . Diabetes Mother   . Healthy Sister     x 2  . Cancer Brother     stomach  . Dementia Father    Social History:  reports that he has been smoking Cigarettes.  He has a 35 pack-year smoking history. He has never used smokeless tobacco. He reports that he does not drink alcohol or use illicit drugs.  Allergies:  Allergies  Allergen Reactions  . Iohexol Hives     Desc: pt broke out in hives years ago from iv contrast. he was given 20m benadryl po, 1 hr prior to ct and did fine today w/o complications.  JB     Medications Prior to Admission  Medication Sig Dispense Refill  . albuterol (PROVENTIL HFA;VENTOLIN HFA) 108 (90 BASE) MCG/ACT inhaler Inhale 1-2 puffs into the lungs every 6 (six) hours as needed for wheezing or shortness of breath. 1 Inhaler 0  . buPROPion (WELLBUTRIN XL) 300 MG 24 hr tablet Take 1 tablet (300 mg  total) by mouth daily. 30 tablet 3  . fish oil-omega-3 fatty acids 1000 MG capsule Take 2 g by mouth daily as needed (for supplementatioin).     .Marland Kitchenlosartan (COZAAR) 50 MG tablet Take 50 mg by mouth daily.  0  . omeprazole (PRILOSEC) 40 MG capsule Take 40 mg by mouth daily as needed (for acid reflux or heartburn).   0  . tetrahydrozoline-zinc (VISINE-AC) 0.05-0.25 % ophthalmic solution Place 1 drop into both eyes daily as needed.      Results for orders placed or performed during the hospital encounter of 05/12/16 (from the past 48 hour(s))  CBC     Status: Abnormal   Collection Time: 05/12/16  2:36 PM  Result Value Ref Range   WBC 9.5 4.0 - 10.5 K/uL   RBC 4.13 (L) 4.22 - 5.81 MIL/uL   Hemoglobin 13.7 13.0 - 17.0 g/dL   HCT 43.8 39.0 - 52.0 %   MCV 106.1 (H) 78.0 - 100.0 fL   MCH 33.2 26.0 - 34.0 pg   MCHC 31.3 30.0 - 36.0 g/dL   RDW 12.3 11.5 - 15.5 %   Platelets 360 150 - 400 K/uL  Comprehensive metabolic  panel     Status: Abnormal   Collection Time: 05/12/16  2:36 PM  Result Value Ref Range   Sodium 140 135 - 145 mmol/L   Potassium 4.6 3.5 - 5.1 mmol/L   Chloride 107 101 - 111 mmol/L   CO2 26 22 - 32 mmol/L   Glucose, Bld 93 65 - 99 mg/dL   BUN 17 6 - 20 mg/dL   Creatinine, Ser 1.26 (H) 0.61 - 1.24 mg/dL   Calcium 10.2 8.9 - 10.3 mg/dL   Total Protein 7.8 6.5 - 8.1 g/dL   Albumin 3.7 3.5 - 5.0 g/dL   AST 20 15 - 41 U/L   ALT 18 17 - 63 U/L   Alkaline Phosphatase 73 38 - 126 U/L   Total Bilirubin 0.7 0.3 - 1.2 mg/dL   GFR calc non Af Amer 56 (L) >60 mL/min   GFR calc Af Amer >60 >60 mL/min    Comment: (NOTE) The eGFR has been calculated using the CKD EPI equation. This calculation has not been validated in all clinical situations. eGFR's persistently <60 mL/min signify possible Chronic Kidney Disease.    Anion gap 7 5 - 15   No results found.  Review of Systems  All other systems reviewed and are negative.   Blood pressure 187/88, pulse 83, temperature 97.7 F  (36.5 C), temperature source Oral, resp. rate 20, height _0  (1.803 m), weight 68.947 kg (152 lb), SpO2 100 %. Physical Exam  Constitutional: He is oriented to person, place, and time. He appears well-developed and well-nourished. No distress.  HENT:  Head: Normocephalic and atraumatic.  Right Ear: External ear normal.  Left Ear: External ear normal.  Nose: Nose normal.  Mouth/Throat: Oropharynx is clear and moist.  Markedly hoarse.  Eyes: Conjunctivae and EOM are normal. Pupils are equal, round, and reactive to light.  Neck: Normal range of motion. Neck supple.  Cardiovascular: Normal rate.   Respiratory: Effort normal.  Musculoskeletal: Normal range of motion.  Neurological: He is alert and oriented to person, place, and time. No cranial nerve deficit.  Skin: Skin is warm and dry.  Psychiatric: He has a normal mood and affect. His behavior is normal. Judgment and thought content normal.     Assessment/Plan Right vocal fold lesion, hoarseness To OR for SMDL with CO2 laser excision of right vocal fold lesion.  Melida Quitter, MD 05/14/2016, 9:51 AM

## 2016-05-14 NOTE — Transfer of Care (Signed)
Immediate Anesthesia Transfer of Care Note  Patient: Ronald Wolf  Procedure(s) Performed: Procedure(s) with comments: MICROLARYNGOSCOPY WITH CO2 LASER AND EXCISION OF VOCAL CORD LESION (N/A) - Suspended micro laryngoscopy with CO2 laser excision of vocal fold lesion  Patient Location: PACU  Anesthesia Type:General  Level of Consciousness: awake  Airway & Oxygen Therapy: Patient Spontanous Breathing and Patient connected to nasal cannula oxygen  Post-op Assessment: Report given to RN and Post -op Vital signs reviewed and stable  Post vital signs: Reviewed and stable  Last Vitals:  Filed Vitals:   05/14/16 0755  BP: 187/88  Pulse: 83  Temp: 36.5 C  Resp: 20    Last Pain: There were no vitals filed for this visit.       Complications: No apparent anesthesia complications

## 2016-05-14 NOTE — Anesthesia Postprocedure Evaluation (Signed)
Anesthesia Post Note  Patient: Ronald Wolf  Procedure(s) Performed: Procedure(s) (LRB): MICROLARYNGOSCOPY WITH CO2 LASER AND EXCISION OF VOCAL CORD LESION (N/A)  Patient location during evaluation: PACU Anesthesia Type: General Level of consciousness: awake and alert, oriented and patient cooperative Pain management: pain level controlled Vital Signs Assessment: post-procedure vital signs reviewed and stable Respiratory status: spontaneous breathing, nonlabored ventilation and respiratory function stable Cardiovascular status: blood pressure returned to baseline and stable Postop Assessment: no signs of nausea or vomiting Anesthetic complications: no    Last Vitals:  Filed Vitals:   05/14/16 1209 05/14/16 1221  BP: 159/86 165/83  Pulse: 64 65  Temp: 36.4 C   Resp: 20     Last Pain: There were no vitals filed for this visit.               [name]

## 2016-05-14 NOTE — Brief Op Note (Signed)
05/14/2016  10:50 AM  PATIENT:  Ronald Wolf  70 y.o. male  PRE-OPERATIVE DIAGNOSIS:  vocal cord lesion  POST-OPERATIVE DIAGNOSIS:  vocal cord lesion  PROCEDURE:  Procedure(s) with comments: MICROLARYNGOSCOPY WITH CO2 LASER AND EXCISION OF VOCAL CORD LESION (N/A) - Suspended micro laryngoscopy with CO2 laser excision of vocal fold lesion  SURGEON:  Surgeon(s) and Role:    * Melida Quitter, MD - Primary  PHYSICIAN ASSISTANT:   ASSISTANTS: none   ANESTHESIA:   general  EBL:  Total I/O In: 700 [I.V.:700] Out: 25 [Blood:25]  BLOOD ADMINISTERED:none  DRAINS: none   LOCAL MEDICATIONS USED:  NONE  SPECIMEN:  Source of Specimen:  right vocal fold lesion  DISPOSITION OF SPECIMEN:  PATHOLOGY  COUNTS:  YES  TOURNIQUET:  * No tourniquets in log *  DICTATION: .Other Dictation: Dictation Number [id number]  PLAN OF CARE: Discharge to home after PACU  PATIENT DISPOSITION:  PACU - hemodynamically stable.   Delay start of Pharmacological VTE agent (>24hrs) due to surgical blood loss or risk of bleeding: no

## 2016-05-14 NOTE — Anesthesia Preprocedure Evaluation (Addendum)
Anesthesia Evaluation  Patient identified by MRN, date of birth, ID band Patient awake    Reviewed: Allergy & Precautions, NPO status , Patient's Chart, lab work & pertinent test results  History of Anesthesia Complications Negative for: history of anesthetic complications  Airway Mallampati: I  TM Distance: >3 FB Neck ROM: Full    Dental  (+) Edentulous Upper, Edentulous Lower   Pulmonary shortness of breath, COPD,  COPD inhaler, Current Smoker,    breath sounds clear to auscultation       Cardiovascular Exercise Tolerance: Poor hypertension, Pt. on medications (-) angina+ Peripheral Vascular Disease   Rhythm:Regular Rate:Normal     Neuro/Psych    GI/Hepatic GERD  Medicated and Controlled,(+) Hepatitis -, C  Endo/Other  negative endocrine ROS  Renal/GU Renal InsufficiencyRenal disease (creat 1.26)     Musculoskeletal  (+) Arthritis , Osteoarthritis,    Abdominal Normal abdominal exam  (+)   Peds  Hematology negative hematology ROS (+)   Anesthesia Other Findings   Reproductive/Obstetrics                           Lab Results  Component Value Date   WBC 9.5 05/12/2016   HGB 13.7 05/12/2016   HCT 43.8 05/12/2016   MCV 106.1* 05/12/2016   PLT 360 05/12/2016     Chemistry      Component Value Date/Time   NA 140 05/12/2016 1436   NA 145 03/08/2016 1356   K 4.6 05/12/2016 1436   K 4.5 03/08/2016 1356   CL 107 05/12/2016 1436   CO2 26 05/12/2016 1436   CO2 28 03/08/2016 1356   BUN 17 05/12/2016 1436   BUN 14.2 03/08/2016 1356   CREATININE 1.26* 05/12/2016 1436   CREATININE 0.9 03/08/2016 1356   CREATININE 1.00 06/28/2014 1122      Component Value Date/Time   CALCIUM 10.2 05/12/2016 1436   CALCIUM 9.8 03/08/2016 1356   ALKPHOS 73 05/12/2016 1436   ALKPHOS 79 03/08/2016 1356   AST 20 05/12/2016 1436   AST 42* 03/08/2016 1356   ALT 18 05/12/2016 1436   ALT 43 03/08/2016 1356    BILITOT 0.7 05/12/2016 1436   BILITOT 0.74 03/08/2016 1356     BP Readings from Last 3 Encounters:  05/14/16 187/88  05/12/16 141/82  03/08/16 177/92    Anesthesia Physical Anesthesia Plan  ASA: III  Anesthesia Plan: General   Post-op Pain Management:    Induction: Intravenous  Airway Management Planned:   Additional Equipment:   Intra-op Plan:   Post-operative Plan: Extubation in OR  Informed Consent: I have reviewed the patients History and Physical, chart, labs and discussed the procedure including the risks, benefits and alternatives for the proposed anesthesia with the patient or authorized representative who has indicated his/her understanding and acceptance.   Dental advisory given  Plan Discussed with: CRNA, Anesthesiologist and Surgeon  Anesthesia Plan Comments: (Plan routine monitors, GA with Jet Ventilation)       Anesthesia Quick Evaluation

## 2016-05-15 NOTE — Op Note (Signed)
NAME:  Ronald Wolf, Ronald Wolf NO.:  [id number]  MEDICAL RECORD NO.:  [id number]  LOCATION:  [facility]                         FACILITY:  [facility]  PHYSICIAN:  Onnie Graham, MD     DATE OF BIRTH:  [birth year]  DATE OF PROCEDURE:  05/14/2016 DATE OF DISCHARGE:  05/14/2016                              OPERATIVE REPORT   PREOPERATIVE DIAGNOSIS:  Right vocal fold lesion and hoarseness.  POSTOPERATIVE DIAGNOSIS:  Right vocal fold lesion and hoarseness.  PROCEDURE:  Suspended microdirect laryngoscopy with CO2 laser excision of right vocal fold lesion.  SURGEON:  Melida Quitter, M.D.  ANESTHESIA:  General mask anesthesia.  COMPLICATIONS:  None.  INDICATION:  The patient is a 70 year old male with a couple of months history of worsening hoarseness with a little bit of trouble swallowing but no pain.  In the office, he was found to have a pedunculated white lesion of the right vocal fold and presents to the operating room for surgical management.  FINDINGS:  There was a fairly bulky, wide and papillary mass of the right vocal fold that was pedicled to much of the length of the membranous vocal fold and extending just a bit onto the superior surface of the vocal fold.  The gross lesion was fully excised in a piecemeal fashion with CO2 laser by dissecting into the superficial muscular portion of the vocal fold.  DESCRIPTION OF PROCEDURE:  The patient was identified in the holding room and informed consent having been obtained including discussion of risks, benefits, alternatives, the patient was brought to the operative suite and put on the operating table in supine position.  Anesthesia was induced, and the patient was maintained via mask ventilation.  The eyes were taped closed and the bed was turned 90 degrees from anesthesia.  A damp gauze was placed over the upper gum and a Storz laryngoscope was then placed into a glottic position and suspended to the Mayo stand using a  Lewy arm.  Jet ventilation was then initiated.  Damp eye pads were taped over the eyes and a damp towel was placed over the face.  A 0- degree telescope was used to make a preoperative photograph.  The lesion was then grasped with an upbiting cup forceps and using the CO2 laser, the lesion was removed in a piecemeal fashion and passed to Nursing for pathology.  Dissection continued to just lateral of the lesion on the superior surface of the vocal fold and dissection continued through the superficial muscular layer of the vocal fold until the entirety of the lesion was removed grossly.  Epinephrine-soaked pledgets were held against the site to help control bleeding.  Airway was suctioned and a postoperative photograph was made with a 0-degree telescope. Topical lidocaine was then sprayed against the larynx, and the laryngoscope was then taken out of suspension and removed from the patient's mouth while suctioning the airway.  He was returned to mask ventilation and turned back to Anesthesia for wake up.  He was moved to recovery room in stable condition.     Onnie Graham, MD     DDB/MEDQ  D:  05/14/2016  T:  05/15/2016  Job:  [id number]  cc:   Dr. Redmond Baseman' office

## 2016-05-17 ENCOUNTER — Encounter (HOSPITAL_COMMUNITY): Payer: Self-pay | Admitting: Otolaryngology

## 2016-06-04 ENCOUNTER — Other Ambulatory Visit: Payer: Self-pay

## 2016-06-04 MED ORDER — LOSARTAN POTASSIUM 50 MG PO TABS: 50.0000 mg | ORAL_TABLET | Freq: Every day | ORAL | Status: DC

## 2016-07-28 ENCOUNTER — Other Ambulatory Visit: Payer: Self-pay | Admitting: Medical

## 2016-08-05 ENCOUNTER — Ambulatory Visit (HOSPITAL_COMMUNITY): Payer: Medicare Other

## 2016-08-13 ENCOUNTER — Telehealth: Payer: Self-pay | Admitting: Family Medicine

## 2016-08-13 NOTE — Telephone Encounter (Signed)
Received call from [facility]. Patient complaining of severe oral pain. Has history of trigeminal neuralgia. He's requesting carbamazepine. This is no longer his medication list. It appears that it was discontinued in March. He has not seen his PCP since September 2016. Will not refill this medication as a result of the above. He has seen neurology. RN to direct him to the on-call neurologist.

## 2016-09-14 ENCOUNTER — Encounter: Payer: Self-pay | Admitting: Gastroenterology

## 2016-09-14 ENCOUNTER — Ambulatory Visit (HOSPITAL_COMMUNITY): Payer: Medicare Other

## 2016-11-02 ENCOUNTER — Ambulatory Visit (AMBULATORY_SURGERY_CENTER): Payer: Self-pay

## 2016-11-02 ENCOUNTER — Telehealth: Payer: Self-pay

## 2016-11-02 VITALS — Ht 71.0 in | Wt 176.0 lb

## 2016-11-02 DIAGNOSIS — Z860100 Personal history of colon polyps, unspecified: Secondary | ICD-10-CM

## 2016-11-02 MED ORDER — SUPREP BOWEL PREP KIT 17.5-3.13-1.6 GM/177ML PO SOLN: 1.0000 | Freq: Once | ORAL | 0 refills | Status: AC

## 2016-11-02 NOTE — Telephone Encounter (Signed)
Dr Ardeth Perfect,      Pt saw me today in previsit for colonoscopy.  He mentioned he hasnt taken his Losartan in 3 days b/c he feels so dizzy.  Advised him to call your office, but thought I'd send you a note as well.                                                           Just FYI,                                                                          Thurston Pounds RN

## 2016-11-02 NOTE — Progress Notes (Signed)
No allergies to eggs or soy No past problems with anesthesia No diet meds No home oxygen  Declined emmi 

## 2016-11-09 ENCOUNTER — Encounter: Payer: Self-pay | Admitting: Gastroenterology

## 2016-11-16 ENCOUNTER — Encounter: Payer: Self-pay | Admitting: Gastroenterology

## 2016-11-16 ENCOUNTER — Ambulatory Visit (AMBULATORY_SURGERY_CENTER): Payer: Medicare Other | Admitting: Gastroenterology

## 2016-11-16 VITALS — BP 143/89 | HR 71 | Temp 97.8°F | Resp 24 | Ht 71.0 in | Wt 176.0 lb

## 2016-11-16 DIAGNOSIS — D124 Benign neoplasm of descending colon: Secondary | ICD-10-CM | POA: Diagnosis not present

## 2016-11-16 DIAGNOSIS — D123 Benign neoplasm of transverse colon: Secondary | ICD-10-CM

## 2016-11-16 DIAGNOSIS — Z86010 Personal history of colonic polyps: Secondary | ICD-10-CM

## 2016-11-16 DIAGNOSIS — D122 Benign neoplasm of ascending colon: Secondary | ICD-10-CM

## 2016-11-16 MED ORDER — SODIUM CHLORIDE 0.9 % IV SOLN: 500.0000 mL | INTRAVENOUS | Status: DC

## 2016-11-16 NOTE — Patient Instructions (Signed)
YOU HAD AN ENDOSCOPIC PROCEDURE TODAY AT [facility]:   Refer to the procedure report that was given to you for any specific questions about what was found during the examination.  If the procedure report does not answer your questions, please call your gastroenterologist to clarify.  If you requested that your care partner not be given the details of your procedure findings, then the procedure report has been included in a sealed envelope for you to review at your convenience later.  YOU SHOULD EXPECT: Some feelings of bloating in the abdomen. Passage of more gas than usual.  Walking can help get rid of the air that was put into your GI tract during the procedure and reduce the bloating. If you had a lower endoscopy (such as a colonoscopy or flexible sigmoidoscopy) you may notice spotting of blood in your stool or on the toilet paper. If you underwent a bowel prep for your procedure, you may not have a normal bowel movement for a few days.  Please Note:  You might notice some irritation and congestion in your nose or some drainage.  This is from the oxygen used during your procedure.  There is no need for concern and it should clear up in a day or so.  SYMPTOMS TO REPORT IMMEDIATELY:   Following lower endoscopy (colonoscopy or flexible sigmoidoscopy):  Excessive amounts of blood in the stool  Significant tenderness or worsening of abdominal pains  Swelling of the abdomen that is new, acute  Fever of 100F or higher   Following upper endoscopy (EGD)  Vomiting of blood or coffee ground material  New chest pain or pain under the shoulder blades  Painful or persistently difficult swallowing  New shortness of breath  Fever of 100F or higher  Black, tarry-looking stools  For urgent or emergent issues, a gastroenterologist can be reached at any hour by calling [phone].   DIET:  We do recommend a small meal at first, but then you may proceed to your regular diet.  Drink  plenty of fluids but you should avoid alcoholic beverages for 24 hours.  ACTIVITY:  You should plan to take it easy for the rest of today and you should NOT DRIVE or use heavy machinery until tomorrow (because of the sedation medicines used during the test).    FOLLOW UP: Our staff will call the number listed on your records the next business day following your procedure to check on you and address any questions or concerns that you may have regarding the information given to you following your procedure. If we do not reach you, we will leave a message.  However, if you are feeling well and you are not experiencing any problems, there is no need to return our call.  We will assume that you have returned to your regular daily activities without incident.  If any biopsies were taken you will be contacted by phone or by letter within the next 1-3 weeks.  Please call us at [phone] if you have not heard about the biopsies in 3 weeks.   Polyps (handout given) Diverticulosis (handout given) Repeat Colonoscopy will be determined by pathology   SIGNATURES/CONFIDENTIALITY: You and/or your care partner have signed paperwork which will be entered into your electronic medical record.  These signatures attest to the fact that that the information above on your After Visit Summary has been reviewed and is understood.  Full responsibility of the confidentiality of this discharge information lies with you and/or  your care-partner. 

## 2016-11-16 NOTE — Progress Notes (Signed)
Upon admission" pt. Stated that he has a sprang ankle left,pt. Stated that he did it helping granddaughter put up [employer]. he lost his balance."

## 2016-11-16 NOTE — Progress Notes (Signed)
Called to room to assist during endoscopic procedure.  Patient ID and intended procedure confirmed with present staff. Received instructions for my participation in the procedure from the performing physician.  

## 2016-11-16 NOTE — Progress Notes (Signed)
Report given to PACU RN, vss 

## 2016-11-16 NOTE — Op Note (Signed)
[place] Patient Name: Ronald Wolf Procedure Date: 11/16/2016 11:06 AM MRN: [id number] Endoscopist: [place]. Loletha Carrow , MD Age: 70 Referring MD:  Date of Birth: Feb 22, 1946 Gender: Male Account #: [id number] Procedure:                Colonoscopy Indications:              High risk colon cancer surveillance: Personal                            history of colonic polyps (2 diminutive TA 1998, no                            polyps 2001) Medicines:                Monitored Anesthesia Care Procedure:                Pre-Anesthesia Assessment:                           - Prior to the procedure, a History and Physical                            was performed, and patient medications and                            allergies were reviewed. The patient's tolerance of                            previous anesthesia was also reviewed. The risks                            and benefits of the procedure and the sedation                            options and risks were discussed with the patient.                            All questions were answered, and informed consent                            was obtained. Prior Anticoagulants: The patient has                            taken no previous anticoagulant or antiplatelet                            agents. ASA Grade Assessment: II - A patient with                            mild systemic disease. After reviewing the risks                            and benefits, the patient was deemed in  satisfactory condition to undergo the procedure.                           After obtaining informed consent, the colonoscope                            was passed under direct vision. Throughout the                            procedure, the patient's blood pressure, pulse, and                            oxygen saturations were monitored continuously. The                            Model CF-HQ190L [id number] [id number]) scope was introduced                           through the anus and advanced to the the cecum,                            identified by appendiceal orifice and ileocecal                            valve. The colonoscopy was performed without                            difficulty. The patient tolerated the procedure                            well. The quality of the bowel preparation was                            excellent. The ileocecal valve, appendiceal                            orifice, and rectum were photographed. The quality                            of the bowel preparation was evaluated using the                            BBPS [facility] Bowel Preparation Scale) with scores                            of: Right Colon = 3, Transverse Colon = 3 and Left                            Colon = 3 (entire mucosa seen well with no residual                            staining, small fragments of stool or opaque  liquid). The total BBPS score equals 9. The bowel                            preparation used was SUPREP. Scope In: 11:20:47 AM Scope Out: 11:39:52 AM Scope Withdrawal Time: 0 hours 15 minutes 39 seconds  Total Procedure Duration: 0 hours 19 minutes 5 seconds  Findings:                 The digital rectal exam findings include decreased                            sphincter tone.                           Six sessile polyps were found in the proximal                            descending colon, distal transverse colon and                            hepatic flexure. The polyps were 4 mm in size.                            These polyps were removed with a cold snare.                            Resection and retrieval were complete.                           A few medium-mouthed diverticula were found in the                            left colon and right colon.                           The exam was otherwise without abnormality on                            direct and retroflexion  views. Complications:            No immediate complications. Estimated Blood Loss:     Estimated blood loss: none. Impression:               - Decreased sphincter tone found on digital rectal                            exam.                           - Six 4 mm polyps in the proximal descending colon,                            in the distal transverse colon and at the hepatic                            flexure, removed  with a cold snare. Resected and                            retrieved.                           - Diverticulosis in the left colon and in the right                            colon.                           - The examination was otherwise normal on direct                            and retroflexion views. Recommendation:           - Patient has a contact number available for                            emergencies. The signs and symptoms of potential                            delayed complications were discussed with the                            patient. Return to normal activities tomorrow.                            Written discharge instructions were provided to the                            patient.                           - Resume previous diet.                           - Continue present medications.                           - Await pathology results.                           - Repeat colonoscopy is recommended for                            surveillance. The colonoscopy date will be                            determined after pathology results from today's                            exam become available for review. Angelamarie Avakian L. Loletha Carrow, MD 11/16/2016 11:51:12 AM This report has been signed electronically.

## 2016-11-17 ENCOUNTER — Telehealth: Payer: Self-pay | Admitting: *Deleted

## 2016-11-17 NOTE — Telephone Encounter (Signed)
  Follow up Call-  Call back number 11/16/2016  Post procedure Call Back phone  # [phone]  Permission to leave phone message Yes  Some recent data might be hidden     Patient questions:  Do you have a fever, pain , or abdominal swelling? No. Pain Score  0 *  Have you tolerated food without any problems? Yes.    Have you been able to return to your normal activities? Yes.    Do you have any questions about your discharge instructions: Diet   No. Medications  No. Follow up visit  No.  Do you have questions or concerns about your Care? No.  Actions: * If pain score is 4 or above: No action needed, pain <4.

## 2016-11-18 ENCOUNTER — Encounter: Payer: Self-pay | Admitting: Gastroenterology

## 2017-01-25 ENCOUNTER — Other Ambulatory Visit: Payer: Self-pay | Admitting: Internal Medicine

## 2017-01-25 DIAGNOSIS — Z0000 Encounter for general adult medical examination without abnormal findings: Secondary | ICD-10-CM

## 2017-01-25 DIAGNOSIS — F17208 Nicotine dependence, unspecified, with other nicotine-induced disorders: Secondary | ICD-10-CM

## 2017-01-27 ENCOUNTER — Inpatient Hospital Stay
Admission: RE | Admit: 2017-01-27 | Discharge: 2017-01-27 | Disposition: A | Discharge status: Home / Self Care | Payer: Medicare Other | Source: Ambulatory Visit | Attending: Internal Medicine | Admitting: Internal Medicine

## 2017-07-13 ENCOUNTER — Encounter (HOSPITAL_COMMUNITY): Payer: Self-pay | Admitting: *Deleted

## 2017-07-13 DIAGNOSIS — R4182 Altered mental status, unspecified: Secondary | ICD-10-CM | POA: Diagnosis present

## 2017-07-13 LAB — CBG MONITORING, ED: Glucose-Capillary: 94 mg/dL (ref 65–99)

## 2017-07-13 NOTE — ED Triage Notes (Signed)
Pt lives alone. A neighbor found pt outside, disoriented and confused, then contacted pt's family. Pt reports not eating x 2 days due to lack of appetite. Pt A&Ox2, to place and name

## 2017-07-14 ENCOUNTER — Emergency Department (HOSPITAL_COMMUNITY)
Admission: EM | Admit: 2017-07-14 | Discharge: 2017-07-14 | Discharge status: Against Medical Advice | Payer: Medicare Other | Attending: Emergency Medicine | Admitting: Emergency Medicine

## 2017-07-14 LAB — COMPREHENSIVE METABOLIC PANEL
ALT: 20 U/L (ref 17–63)
AST: 22 U/L (ref 15–41)
Albumin: 4.2 g/dL (ref 3.5–5.0)
Alkaline Phosphatase: 97 U/L (ref 38–126)
Anion gap: 10 (ref 5–15)
BUN: 26 mg/dL — ABNORMAL HIGH (ref 6–20)
CO2: 19 mmol/L — ABNORMAL LOW (ref 22–32)
Calcium: 10 mg/dL (ref 8.9–10.3)
Chloride: 109 mmol/L (ref 101–111)
Creatinine, Ser: 1.42 mg/dL — ABNORMAL HIGH (ref 0.61–1.24)
GFR calc Af Amer: 56 mL/min — ABNORMAL LOW (ref >60)
GFR calc non Af Amer: 48 mL/min — ABNORMAL LOW (ref >60)
Glucose, Bld: 98 mg/dL (ref 65–99)
Potassium: 4.6 mmol/L (ref 3.5–5.1)
Sodium: 138 mmol/L (ref 135–145)
Total Bilirubin: 0.8 mg/dL (ref 0.3–1.2)
Total Protein: 8.3 g/dL — ABNORMAL HIGH (ref 6.5–8.1)

## 2017-07-14 LAB — CBC
HCT: 42.6 % (ref 39.0–52.0)
Hemoglobin: 13.9 g/dL (ref 13.0–17.0)
MCH: 34.6 pg — ABNORMAL HIGH (ref 26.0–34.0)
MCHC: 32.6 g/dL (ref 30.0–36.0)
MCV: 106 fL — ABNORMAL HIGH (ref 78.0–100.0)
Platelets: 380 10*3/uL (ref 150–400)
RBC: 4.02 MIL/uL — ABNORMAL LOW (ref 4.22–5.81)
RDW: 11.9 % (ref 11.5–15.5)
WBC: 9.4 10*3/uL (ref 4.0–10.5)

## 2017-07-14 NOTE — ED Notes (Signed)
Unable to locate pt  

## 2017-07-14 NOTE — ED Notes (Signed)
Pt seen exiting lobby with family

## 2017-07-14 NOTE — ED Notes (Addendum)
Pt called for in waiting area for vitial signs. No answer no response.

## 2017-07-25 ENCOUNTER — Other Ambulatory Visit: Payer: Self-pay | Admitting: Internal Medicine

## 2017-07-25 DIAGNOSIS — R4182 Altered mental status, unspecified: Secondary | ICD-10-CM

## 2017-08-08 ENCOUNTER — Other Ambulatory Visit: Payer: Self-pay | Admitting: Internal Medicine

## 2017-08-08 ENCOUNTER — Ambulatory Visit
Admission: RE | Admit: 2017-08-08 | Discharge: 2017-08-08 | Disposition: A | Discharge status: Home / Self Care | Payer: Medicare Other | Source: Ambulatory Visit | Attending: Internal Medicine | Admitting: Internal Medicine

## 2017-08-08 DIAGNOSIS — R4182 Altered mental status, unspecified: Secondary | ICD-10-CM

## 2017-08-22 ENCOUNTER — Other Ambulatory Visit: Payer: Self-pay | Admitting: Internal Medicine

## 2017-08-22 DIAGNOSIS — I639 Cerebral infarction, unspecified: Secondary | ICD-10-CM

## 2017-08-23 ENCOUNTER — Telehealth: Payer: Self-pay | Admitting: Radiology

## 2017-08-23 NOTE — Telephone Encounter (Signed)
Called 13 hour prep to [facility]. Called pt and explained prep, he seemed confused about what I was taking about. Explained he would need to take 3 doses of Prednisone at specific times and Benadryl with the last dose.

## 2017-08-24 ENCOUNTER — Ambulatory Visit
Admission: RE | Admit: 2017-08-24 | Discharge: 2017-08-24 | Disposition: A | Discharge status: Home / Self Care | Payer: Medicare Other | Source: Ambulatory Visit | Attending: Internal Medicine | Admitting: Internal Medicine

## 2017-08-24 DIAGNOSIS — I639 Cerebral infarction, unspecified: Secondary | ICD-10-CM

## 2017-08-24 MED ORDER — IOPAMIDOL (ISOVUE-370) INJECTION 76%: 75.0000 mL | Freq: Once | INTRAVENOUS | Status: DC | PRN

## 2017-08-26 ENCOUNTER — Other Ambulatory Visit (HOSPITAL_COMMUNITY): Payer: Self-pay | Admitting: Interventional Radiology

## 2017-08-26 ENCOUNTER — Other Ambulatory Visit: Payer: Medicare Other

## 2017-08-26 DIAGNOSIS — I771 Stricture of artery: Secondary | ICD-10-CM

## 2017-08-29 ENCOUNTER — Ambulatory Visit (HOSPITAL_COMMUNITY): Payer: Medicare Other | Attending: Cardiology

## 2017-08-29 DIAGNOSIS — I10 Essential (primary) hypertension: Secondary | ICD-10-CM | POA: Diagnosis not present

## 2017-08-29 DIAGNOSIS — I639 Cerebral infarction, unspecified: Secondary | ICD-10-CM | POA: Diagnosis present

## 2017-08-29 DIAGNOSIS — Z720 Tobacco use: Secondary | ICD-10-CM | POA: Insufficient documentation

## 2017-09-07 ENCOUNTER — Other Ambulatory Visit: Payer: Self-pay | Admitting: Radiology

## 2017-09-07 ENCOUNTER — Ambulatory Visit (HOSPITAL_COMMUNITY)
Admission: RE | Admit: 2017-09-07 | Discharge: 2017-09-07 | Disposition: A | Discharge status: Home / Self Care | Payer: Medicare Other | Source: Ambulatory Visit | Attending: Interventional Radiology | Admitting: Interventional Radiology

## 2017-09-07 ENCOUNTER — Other Ambulatory Visit (HOSPITAL_COMMUNITY): Payer: Self-pay | Admitting: Interventional Radiology

## 2017-09-07 DIAGNOSIS — I771 Stricture of artery: Secondary | ICD-10-CM

## 2017-09-07 HISTORY — PX: IR RADIOLOGIST EVAL & MGMT: IMG5224

## 2017-09-08 ENCOUNTER — Encounter (HOSPITAL_COMMUNITY): Payer: Self-pay | Admitting: Interventional Radiology

## 2017-09-09 ENCOUNTER — Encounter (HOSPITAL_COMMUNITY): Payer: Self-pay

## 2017-09-09 ENCOUNTER — Other Ambulatory Visit (HOSPITAL_COMMUNITY): Payer: Self-pay | Admitting: Interventional Radiology

## 2017-09-09 ENCOUNTER — Ambulatory Visit (HOSPITAL_COMMUNITY)
Admission: RE | Admit: 2017-09-09 | Discharge: 2017-09-09 | Disposition: A | Discharge status: Home / Self Care | Payer: Medicare Other | Source: Ambulatory Visit | Attending: Interventional Radiology | Admitting: Interventional Radiology

## 2017-09-09 DIAGNOSIS — B1920 Unspecified viral hepatitis C without hepatic coma: Secondary | ICD-10-CM | POA: Insufficient documentation

## 2017-09-09 DIAGNOSIS — F17210 Nicotine dependence, cigarettes, uncomplicated: Secondary | ICD-10-CM | POA: Insufficient documentation

## 2017-09-09 DIAGNOSIS — I771 Stricture of artery: Secondary | ICD-10-CM

## 2017-09-09 DIAGNOSIS — Z8673 Personal history of transient ischemic attack (TIA), and cerebral infarction without residual deficits: Secondary | ICD-10-CM | POA: Diagnosis not present

## 2017-09-09 DIAGNOSIS — Z7982 Long term (current) use of aspirin: Secondary | ICD-10-CM | POA: Diagnosis not present

## 2017-09-09 DIAGNOSIS — G508 Other disorders of trigeminal nerve: Secondary | ICD-10-CM | POA: Insufficient documentation

## 2017-09-09 DIAGNOSIS — K219 Gastro-esophageal reflux disease without esophagitis: Secondary | ICD-10-CM | POA: Diagnosis not present

## 2017-09-09 DIAGNOSIS — F329 Major depressive disorder, single episode, unspecified: Secondary | ICD-10-CM | POA: Insufficient documentation

## 2017-09-09 DIAGNOSIS — I10 Essential (primary) hypertension: Secondary | ICD-10-CM | POA: Diagnosis not present

## 2017-09-09 DIAGNOSIS — I6502 Occlusion and stenosis of left vertebral artery: Secondary | ICD-10-CM | POA: Diagnosis not present

## 2017-09-09 DIAGNOSIS — I6601 Occlusion and stenosis of right middle cerebral artery: Secondary | ICD-10-CM | POA: Diagnosis not present

## 2017-09-09 DIAGNOSIS — I6522 Occlusion and stenosis of left carotid artery: Secondary | ICD-10-CM | POA: Insufficient documentation

## 2017-09-09 DIAGNOSIS — M1990 Unspecified osteoarthritis, unspecified site: Secondary | ICD-10-CM | POA: Diagnosis not present

## 2017-09-09 DIAGNOSIS — F419 Anxiety disorder, unspecified: Secondary | ICD-10-CM | POA: Insufficient documentation

## 2017-09-09 DIAGNOSIS — R930 Abnormal findings on diagnostic imaging of skull and head, not elsewhere classified: Secondary | ICD-10-CM | POA: Diagnosis present

## 2017-09-09 HISTORY — PX: IR ANGIO VERTEBRAL SEL VERTEBRAL BILAT MOD SED: IMG5369

## 2017-09-09 HISTORY — PX: IR ANGIO INTRA EXTRACRAN SEL COM CAROTID INNOMINATE BILAT MOD SED: IMG5360

## 2017-09-09 LAB — BASIC METABOLIC PANEL
Anion gap: 8 (ref 5–15)
BUN: 19 mg/dL (ref 6–20)
CO2: 23 mmol/L (ref 22–32)
Calcium: 8.9 mg/dL (ref 8.9–10.3)
Chloride: 109 mmol/L (ref 101–111)
Creatinine, Ser: 1.43 mg/dL — ABNORMAL HIGH (ref 0.61–1.24)
GFR calc Af Amer: 55 mL/min — ABNORMAL LOW (ref >60)
GFR calc non Af Amer: 48 mL/min — ABNORMAL LOW (ref >60)
Glucose, Bld: 112 mg/dL — ABNORMAL HIGH (ref 65–99)
Potassium: 3.7 mmol/L (ref 3.5–5.1)
Sodium: 140 mmol/L (ref 135–145)

## 2017-09-09 LAB — CBC
HCT: 30.7 % — ABNORMAL LOW (ref 39.0–52.0)
Hemoglobin: 9.9 g/dL — ABNORMAL LOW (ref 13.0–17.0)
MCH: 34.1 pg — ABNORMAL HIGH (ref 26.0–34.0)
MCHC: 32.2 g/dL (ref 30.0–36.0)
MCV: 105.9 fL — ABNORMAL HIGH (ref 78.0–100.0)
Platelets: 222 10*3/uL (ref 150–400)
RBC: 2.9 MIL/uL — ABNORMAL LOW (ref 4.22–5.81)
RDW: 14.9 % (ref 11.5–15.5)
WBC: 8.2 10*3/uL (ref 4.0–10.5)

## 2017-09-09 LAB — PROTIME-INR
INR: 1.02
Prothrombin Time: 13.3 seconds (ref 11.4–15.2)

## 2017-09-09 LAB — APTT: aPTT: 26 seconds (ref 24–36)

## 2017-09-09 MED ORDER — MIDAZOLAM HCL 2 MG/2ML IJ SOLN
INTRAMUSCULAR | Status: AC
  Filled 2017-09-09: qty 2

## 2017-09-09 MED ORDER — DIPHENHYDRAMINE HCL 25 MG PO CAPS
ORAL_CAPSULE | ORAL | Status: AC
  Filled 2017-09-09: qty 2

## 2017-09-09 MED ORDER — SODIUM CHLORIDE 0.9 % IV SOLN: Freq: Once | INTRAVENOUS | Status: DC

## 2017-09-09 MED ORDER — HEPARIN SODIUM (PORCINE) 1000 UNIT/ML IJ SOLN
INTRAMUSCULAR | Status: AC
  Filled 2017-09-09: qty 2

## 2017-09-09 MED ORDER — IOPAMIDOL (ISOVUE-300) INJECTION 61%
INTRAVENOUS | Status: AC
  Administered 2017-09-09: 15 mL
  Filled 2017-09-09: qty 50

## 2017-09-09 MED ORDER — SODIUM CHLORIDE 0.9 % IV SOLN: INTRAVENOUS | Status: AC

## 2017-09-09 MED ORDER — FENTANYL CITRATE (PF) 100 MCG/2ML IJ SOLN
INTRAMUSCULAR | Status: AC | PRN
  Administered 2017-09-09: 25 ug via INTRAVENOUS

## 2017-09-09 MED ORDER — FENTANYL CITRATE (PF) 100 MCG/2ML IJ SOLN
INTRAMUSCULAR | Status: AC
  Filled 2017-09-09: qty 2

## 2017-09-09 MED ORDER — HEPARIN SODIUM (PORCINE) 1000 UNIT/ML IJ SOLN
INTRAMUSCULAR | Status: AC | PRN
  Administered 2017-09-09: 1000 [IU] via INTRAVENOUS

## 2017-09-09 MED ORDER — LIDOCAINE HCL (PF) 1 % IJ SOLN
INTRAMUSCULAR | Status: AC | PRN
  Administered 2017-09-09: 10 mL

## 2017-09-09 MED ORDER — MIDAZOLAM HCL 2 MG/2ML IJ SOLN
INTRAMUSCULAR | Status: AC | PRN
  Administered 2017-09-09: 1 mg via INTRAVENOUS

## 2017-09-09 MED ORDER — DIPHENHYDRAMINE HCL 25 MG PO CAPS
50.0000 mg | ORAL_CAPSULE | ORAL | Status: AC
  Administered 2017-09-09: 50 mg via ORAL

## 2017-09-09 MED ORDER — LIDOCAINE HCL 1 % IJ SOLN
INTRAMUSCULAR | Status: AC
  Filled 2017-09-09: qty 20

## 2017-09-09 MED ORDER — IOPAMIDOL (ISOVUE-300) INJECTION 61%
INTRAVENOUS | Status: AC
  Administered 2017-09-09: 80 mL
  Filled 2017-09-09: qty 150

## 2017-09-09 NOTE — Sedation Documentation (Signed)
Patient is resting comfortably. 

## 2017-09-09 NOTE — H&P (Signed)
Chief Complaint: Patient was seen in consultation today for cerebral arteriogram at the request of Dr Hoover Brunette   Supervising Physician: Luanne Bras  Patient Status: [facility] - Out-pt  History of Present Illness: Ronald Wolf is a 71 y.o. male   Hx CVA 07/2017 Worsening memory issues over last 4 weeks or so Left jaw pain Being treated now for trigeminal neuropathy No real relief  CTA 9/12: IMPRESSION: 1. Positive for severe atherosclerotic stenoses of the: - Left ICA siphon, - Left vertebrobasilar junction, - Left PCA P2 and P3 segments, and - Left MCA M1 segment. This constellation supports a watershed type ischemia mechanism for the posterior left hemisphere infarct which occurred in August. 2. Moderate atherosclerotic stenosis of the: - Left vertebral artery origin, - proximal Basilar artery, - Right ICA siphon, - Right MCA M1 segment. 3. Bilateral cervical carotid atherosclerosis without hemodynamically significant stenosis. 4. Expected appearance of the August posterior left hemisphere infarct. No associated hemorrhage or mass effect. 5. No new intracranial abnormality identified.  Was consulted with Dr Estanislado Pandy 9/26 Scheduled now for cerebral arteriogram    Past Medical History:  Diagnosis Date  . Abdominal pain, other specified site 09/26/2013  . Anxiety   . Arthritis   . Atherosclerosis of arteries 05/13/2013  . Cough 09/26/2013  . Depression   . Eating disorder   . GERD (gastroesophageal reflux disease)   . Hepatitis C   . Hypertension   . Lesion of liver 04/01/2013  . Loss of weight 06/21/2014  . Medial knee pain 09/26/2013  . Palpitations 06/21/2014  . Shortness of breath dyspnea    with exertion    Past Surgical History:  Procedure Laterality Date  . IR RADIOLOGIST EVAL & MGMT  09/07/2017  . MICROLARYNGOSCOPY WITH CO2 LASER AND EXCISION OF VOCAL CORD LESION N/A 05/14/2016   Procedure: MICROLARYNGOSCOPY WITH CO2 LASER AND EXCISION OF  VOCAL CORD LESION;  Surgeon: Melida Quitter, MD;  Location: [place];  Service: ENT;  Laterality: N/A;  Suspended micro laryngoscopy with CO2 laser excision of vocal fold lesion  . TUMOR REMOVAL  1990   bladder    Allergies: Iohexol  Medications: Prior to Admission medications   Medication Sig Start Date End Date Taking? Authorizing Provider  aspirin EC 81 MG tablet Take 81 mg by mouth daily.   Yes [provider]  buPROPion (WELLBUTRIN XL) 300 MG 24 hr tablet Take 1 tablet (300 mg total) by mouth daily. 09/05/15  Yes Mosie Lukes, MD  carbamazepine (CARBATROL) 100 MG 12 hr capsule Take 200 mg by mouth 2 (two) times daily.    Yes [provider]  fish oil-omega-3 fatty acids 1000 MG capsule Take 2 g by mouth daily as needed (for supplementatioin).    Yes [provider]  HYDROcodone-acetaminophen (NORCO/VICODIN) 5-325 MG tablet Take 1 tablet by mouth every 6 (six) hours as needed for moderate pain. 05/14/16  Yes Melida Quitter, MD  losartan (COZAAR) 50 MG tablet take 1 tablet by mouth once daily 07/29/16  Yes Saguier, Percell Miller, PA-C  omeprazole (PRILOSEC) 40 MG capsule Take 40 mg by mouth daily as needed (for acid reflux or heartburn).  04/13/16  Yes [provider]  albuterol (PROVENTIL HFA;VENTOLIN HFA) 108 (90 BASE) MCG/ACT inhaler Inhale 1-2 puffs into the lungs every 6 (six) hours as needed for wheezing or shortness of breath. Patient not taking: Reported on 11/16/2016 06/21/14   Drenda Freeze, MD  losartan (COZAAR) 100 MG tablet  08/14/17  [provider]  tetrahydrozoline-zinc (VISINE-AC) 0.05-0.25 % ophthalmic solution Place 1 drop into both eyes daily as needed.    [provider]     Family History  Problem Relation Age of Onset  . Diabetes Mother   . Healthy Sister        x 2  . Cancer Brother        stomach  . Dementia Father   . Prostate cancer Neg Hx   . Colon cancer Neg Hx   . Breast cancer Neg Hx   . Heart disease Neg Hx    . Hypertension Neg Hx     Social History   Social History  . Marital status: Single    Spouse name: N/A  . Number of children: 6  . Years of education: 52   Social History Main Topics  . Smoking status: Current Every Day Smoker    Packs/day: 1.00    Years: 35.00    Types: Cigarettes  . Smokeless tobacco: Never Used  . Alcohol use 0.0 oz/week     Comment: 1/2 pint daily    none in a month and a half  . Drug use: No  . Sexual activity: Not Asked   Other Topics Concern  . None   Social History Narrative   Patient drinks caffeine occasionally.   Patient is right handed.    Review of Systems: A 12 point ROS discussed and pertinent positives are indicated in the HPI above.  All other systems are negative.  Review of Systems  Constitutional: Positive for activity change. Negative for appetite change, fatigue and fever.  HENT: Negative for tinnitus and trouble swallowing.   Eyes: Negative for visual disturbance.  Respiratory: Negative for shortness of breath.   Cardiovascular: Negative for chest pain.  Gastrointestinal: Negative for abdominal pain.  Musculoskeletal: Negative for gait problem.  Neurological: Negative for dizziness, tremors, seizures, syncope, facial asymmetry, speech difficulty, weakness, light-headedness, numbness and headaches.  Psychiatric/Behavioral: Positive for decreased concentration. Negative for behavioral problems.    Vital Signs: BP 135/74   Pulse 93   Temp 98.5 F (36.9 C)   Resp 18   Ht 5\' 11"  (1.803 m)   Wt 165 lb (74.8 kg)   SpO2 99%   BMI 23.01 kg/m   Physical Exam  Constitutional: He is oriented to person, place, and time. He appears well-nourished.  HENT:  Head: Atraumatic.  Eyes: EOM are normal.  Neck: Neck supple.  Cardiovascular: Normal rate, regular rhythm and normal heart sounds.   No murmur heard. Pulmonary/Chest: Effort normal and breath sounds normal. He has no wheezes.  Abdominal: Soft. Bowel sounds are normal.  There is no tenderness.  Musculoskeletal: Normal range of motion.  Neurological: He is alert and oriented to person, place, and time.  Skin: Skin is warm and dry.  Psychiatric: He has a normal mood and affect. His behavior is normal. Judgment and thought content normal.  Nursing note and vitals reviewed.   Imaging: Ct Angio Head W Or Wo Contrast  Result Date: 08/24/2017 CLINICAL DATA:  71 year old male with left MCA/PCA watershed territory infarct in August, superimposed on underlying chronic ischemia. EXAM: CT ANGIOGRAPHY HEAD AND NECK TECHNIQUE: Multidetector CT imaging of the head and neck was performed using the standard protocol during bolus administration of intravenous contrast. Multiplanar CT image reconstructions and MIPs were obtained to evaluate the vascular anatomy. Carotid stenosis measurements (when applicable) are obtained utilizing NASCET criteria, using the distal internal carotid diameter as the denominator. CONTRAST:  75 mL Isovue 370 COMPARISON:  Brain MRI 08/08/2017, and earlier. Head CT without contrast 02/16/2005. FINDINGS: CT HEAD Brain: Persistent hypodensity in the posterior left hemisphere (series 602, image 40) corresponding to the diffusion abnormality demonstrated in August. No associated hemorrhage or mass effect. Preserved gray-white matter differentiation elsewhere. No ventriculomegaly. Calvarium and skull base: Negative. Paranasal sinuses: Clear. Orbits: Visualized orbits and scalp soft tissues are within normal limits. CTA NECK Skeleton: Absent dentition. Cervical spine degeneration and flowing anterior endplate osteophytes with subsequent cervicothoracic junction interbody ankylosis. No acute osseous abnormality identified. Upper chest: Centrilobular and some paraseptal emphysema. No superior mediastinal lymphadenopathy. Other neck: Negative.  No cervical lymphadenopathy. Aortic arch: 3 vessel arch configuration. Mild to moderate soft and calcified arch atherosclerosis.  Right carotid system: No brachiocephalic or right CCA origin stenosis despite plaque. Soft and calcified plaque at the right carotid bifurcation but no significant stenosis (series 607, image 106). Severe tortuosity of the right ICA at the skullbase with a kinked appearance (Same image). Left carotid system: No left CCA origin stenosis despite some soft plaque. Left carotid bifurcation soft and calcified plaque with no stenosis. Mildly tortuous cervical left ICA. Vertebral arteries: No hemodynamically significant proximal right subclavian artery stenosis despite soft and calcified plaque. Soft and calcified plaque at the right vertebral artery origin resulting in mild to moderate stenosis. Otherwise the right vertebral artery is normal to the skullbase. No proximal left subclavian artery stenosis despite soft and calcified plaque. Soft and calcified plaque at the left vertebral artery origin resulting in moderate stenosis (series 606, image 173). The left vertebral artery is otherwise negative to the skullbase. CTA HEAD Posterior circulation: Mild distal right vertebral artery atherosclerosis proximal to the vertebrobasilar junction with no stenosis. Dominant appearing right AICA origin is patent. Moderate to severe distal left vertebral artery atherosclerosis, worst at the left vertebrobasilar junction where there is severe stenosis or occlusion (series 606, image 126). The basilar artery is patent and irregular in keeping with atherosclerosis. There is mild to moderate tandem basilar artery stenosis. The distal basilar appears more normal. Patent SCA and PCA origins. Right posterior communicating artery is present, the left is diminutive or absent. Right PCA branches are within normal limits. There is moderate to severe irregularity and stenosis in the left PCA P2 and P3 segments (series 700, image 52). The left PICA has an early takeoff and is patent. Anterior circulation: The left ICA siphon is patent but there  is combined soft and calcified plaque beginning in the cavernous segment resulting in tandem moderate to severe stenosis (series 701, image 238 through 245. Normal left ophthalmic artery origin. Patent left carotid terminus. Extensive calcified plaque of the right ICA siphon. Moderate stenosis in the distal cavernous segment. Normal right ophthalmic and posterior communicating artery origins. Patent right ICA terminus. Normal MCA and ACA origins. Tortuous ACA is with diminutive or absent anterior communicating artery. ACA branches are within normal limits. Left MCA M1 segment is patent but irregular with moderate to severe distal M1 stenosis best seen on series 700, image 54. The left MCA bifurcation and left MCA branches are patent with only mild irregularity. Right MCA mid M1 segment demonstrates moderate irregularity and stenosis (series 700, image 52). Right MCA bifurcation is patent. Right MCA branches are within normal limits. Venous sinuses: Patent. Anatomic variants: None. Delayed phase: Mild gyriform post ischemic enhancement at the posterior left hemisphere infarct. No other abnormal enhancement identified. Review of the MIP images confirms the above findings IMPRESSION: 1. Positive for  severe atherosclerotic stenoses of the: - Left ICA siphon, - Left vertebrobasilar junction, - Left PCA P2 and P3 segments, and - Left MCA M1 segment. This constellation supports a watershed type ischemia mechanism for the posterior left hemisphere infarct which occurred in August. 2. Moderate atherosclerotic stenosis of the: - Left vertebral artery origin, - proximal Basilar artery, - Right ICA siphon, - Right MCA M1 segment. 3. Bilateral cervical carotid atherosclerosis without hemodynamically significant stenosis. 4. Expected appearance of the August posterior left hemisphere infarct. No associated hemorrhage or mass effect. 5. No new intracranial abnormality identified. 6. Aortic Atherosclerosis (ICD10-I70.0) and  Emphysema (ICD10-J43.9). Electronically Signed   By: Genevie Ann M.D.   On: 08/24/2017 20:54   Ct Angio Neck W Or Wo Contrast  Result Date: 08/24/2017 CLINICAL DATA:  71 year old male with left MCA/PCA watershed territory infarct in August, superimposed on underlying chronic ischemia. EXAM: CT ANGIOGRAPHY HEAD AND NECK TECHNIQUE: Multidetector CT imaging of the head and neck was performed using the standard protocol during bolus administration of intravenous contrast. Multiplanar CT image reconstructions and MIPs were obtained to evaluate the vascular anatomy. Carotid stenosis measurements (when applicable) are obtained utilizing NASCET criteria, using the distal internal carotid diameter as the denominator. CONTRAST:  75 mL Isovue 370 COMPARISON:  Brain MRI 08/08/2017, and earlier. Head CT without contrast 02/16/2005. FINDINGS: CT HEAD Brain: Persistent hypodensity in the posterior left hemisphere (series 602, image 40) corresponding to the diffusion abnormality demonstrated in August. No associated hemorrhage or mass effect. Preserved gray-white matter differentiation elsewhere. No ventriculomegaly. Calvarium and skull base: Negative. Paranasal sinuses: Clear. Orbits: Visualized orbits and scalp soft tissues are within normal limits. CTA NECK Skeleton: Absent dentition. Cervical spine degeneration and flowing anterior endplate osteophytes with subsequent cervicothoracic junction interbody ankylosis. No acute osseous abnormality identified. Upper chest: Centrilobular and some paraseptal emphysema. No superior mediastinal lymphadenopathy. Other neck: Negative.  No cervical lymphadenopathy. Aortic arch: 3 vessel arch configuration. Mild to moderate soft and calcified arch atherosclerosis. Right carotid system: No brachiocephalic or right CCA origin stenosis despite plaque. Soft and calcified plaque at the right carotid bifurcation but no significant stenosis (series 607, image 106). Severe tortuosity of the right ICA  at the skullbase with a kinked appearance (Same image). Left carotid system: No left CCA origin stenosis despite some soft plaque. Left carotid bifurcation soft and calcified plaque with no stenosis. Mildly tortuous cervical left ICA. Vertebral arteries: No hemodynamically significant proximal right subclavian artery stenosis despite soft and calcified plaque. Soft and calcified plaque at the right vertebral artery origin resulting in mild to moderate stenosis. Otherwise the right vertebral artery is normal to the skullbase. No proximal left subclavian artery stenosis despite soft and calcified plaque. Soft and calcified plaque at the left vertebral artery origin resulting in moderate stenosis (series 606, image 173). The left vertebral artery is otherwise negative to the skullbase. CTA HEAD Posterior circulation: Mild distal right vertebral artery atherosclerosis proximal to the vertebrobasilar junction with no stenosis. Dominant appearing right AICA origin is patent. Moderate to severe distal left vertebral artery atherosclerosis, worst at the left vertebrobasilar junction where there is severe stenosis or occlusion (series 606, image 126). The basilar artery is patent and irregular in keeping with atherosclerosis. There is mild to moderate tandem basilar artery stenosis. The distal basilar appears more normal. Patent SCA and PCA origins. Right posterior communicating artery is present, the left is diminutive or absent. Right PCA branches are within normal limits. There is moderate to severe irregularity and stenosis  in the left PCA P2 and P3 segments (series 700, image 52). The left PICA has an early takeoff and is patent. Anterior circulation: The left ICA siphon is patent but there is combined soft and calcified plaque beginning in the cavernous segment resulting in tandem moderate to severe stenosis (series 701, image 238 through 245. Normal left ophthalmic artery origin. Patent left carotid terminus.  Extensive calcified plaque of the right ICA siphon. Moderate stenosis in the distal cavernous segment. Normal right ophthalmic and posterior communicating artery origins. Patent right ICA terminus. Normal MCA and ACA origins. Tortuous ACA is with diminutive or absent anterior communicating artery. ACA branches are within normal limits. Left MCA M1 segment is patent but irregular with moderate to severe distal M1 stenosis best seen on series 700, image 54. The left MCA bifurcation and left MCA branches are patent with only mild irregularity. Right MCA mid M1 segment demonstrates moderate irregularity and stenosis (series 700, image 52). Right MCA bifurcation is patent. Right MCA branches are within normal limits. Venous sinuses: Patent. Anatomic variants: None. Delayed phase: Mild gyriform post ischemic enhancement at the posterior left hemisphere infarct. No other abnormal enhancement identified. Review of the MIP images confirms the above findings IMPRESSION: 1. Positive for severe atherosclerotic stenoses of the: - Left ICA siphon, - Left vertebrobasilar junction, - Left PCA P2 and P3 segments, and - Left MCA M1 segment. This constellation supports a watershed type ischemia mechanism for the posterior left hemisphere infarct which occurred in August. 2. Moderate atherosclerotic stenosis of the: - Left vertebral artery origin, - proximal Basilar artery, - Right ICA siphon, - Right MCA M1 segment. 3. Bilateral cervical carotid atherosclerosis without hemodynamically significant stenosis. 4. Expected appearance of the August posterior left hemisphere infarct. No associated hemorrhage or mass effect. 5. No new intracranial abnormality identified. 6. Aortic Atherosclerosis (ICD10-I70.0) and Emphysema (ICD10-J43.9). Electronically Signed   By: Genevie Ann M.D.   On: 08/24/2017 20:54   Ir Radiologist Eval & Mgmt  Result Date: 09/08/2017 EXAM: NEW PATIENT OFFICE VISIT CHIEF COMPLAINT: Memory difficulties and confusion a  few weeks ago.  Left jaw pain. Current Pain Level: 1-10 HISTORY OF PRESENT ILLNESS: The patient is a 71 year old right-handed gentleman who has been referred for evaluation of a recently discovered severe stenosis intracranially of his left vertebrobasilar junction, his left internal carotid artery cavernous segment, and probably his left MCA proximally. The patient is accompanied by his brother, his niece and his sister. The patient's main complaints are those of recent onset of memory difficulties, intermittent confusion, and also some difficulty in seeing to the right side. He also complains of left-sided jaw pain which is intermittent, sharp and spreads in the trigeminal nerve distribution over his mandibular, maxillary and also ophthalmic region. This pain is fairly incapacitating. It is precipitated usually by changes in temperature such as smoking, drinking alcohol, or shaving. The patient has been prescribed carbamazepine for this. However, it is doubtful if the patient has been taking these medications on regular basis. Overall, the patient denies any motor weakness, sensory symptoms, incoordination, falling spells, seizure-like activity. He denies any speech difficulties. He lives by himself. Past Medical History: Depression. Hypertension. Tobacco and alcohol abuse. HCV hepatitis C. History of bladder cancer status post cystectomy with ileal conduit in 1990. Previous UTIs. History of cirrhosis of the liver. Medications: Ultram. Carbamazepine. Losartan. Omeprazole. Omega 3 tablets. ProAir albuterol aerosol solution. Allergies: Iohexol. Social History: Single. He has 5 children. Four siblings. The patient drinks up to half a  pint of brandy a day. Smokes 1/2 pack per day cigarettes. Denies using illicit chemicals. Family History:  One brother with intestinal cancer. REVIEW OF SYSTEMS: Negative unless as mentioned above. PHYSICAL EXAMINATION: Appears to be in no acute distress. Affect appropriate to the  situation. Speech and comprehension normal. No gross lateralizing abnormalities except for partial right homonymous visual field deficit. Motor, sensory, coordination, station and gait unremarkable grossly. ASSESSMENT AND PLAN: The patient's recent MRI of the brain, and CT angiogram of the head and neck were all reviewed with him and his family. Brought to their attention was the subacute infarct involving the left occipital and the left parieto-occipital regions. The CT angiogram apparently reveals a high-grade stenosis of the left middle cerebral artery M1 segment, and also the left internal carotid artery intra cavernous segment, and also irregularity of the proximal basilar artery with possible significant stenosis involving the left vertebrobasilar junction. The need for a cerebral diagnostic arteriogram was discussed with him and his family. The reasons of more accurately verifying the suspected areas of stenosis probably symptomatic were all reviewed. The reasoning was to identify a potentially reversible causes of stenosis with probable endovascular intervention to prevent further ischemic neurological event which could be significantly debilitating or even be fatal. The patient, the brother and sister were in agreement to proceed with a diagnostic catheter arteriogram initially. The procedure, the potential minute risk of a stroke, alternatives were all reviewed in detail. Questions were answered to their satisfaction. The diagnostic catheter arteriogram will be scheduled as soon as possible. In the meantime, the patient has been advised to start taking 1 baby aspirin a day. In addition it was stressed upon him to take his carbamazepine on a regular basis to have continued relief of his trigeminal neuralgia. Should the patient develop symptoms of progressive visual blurring, blindness, gait imbalance, stage difficulties, facial or limb weakness, to call 911. They all leave with good understanding and  agreement with the above management plan. Electronically Signed   By: Luanne Bras M.D.   On: 09/07/2017 17:57    Labs:  CBC:  Recent Labs  07/13/17 2302 09/09/17 0827  WBC 9.4 8.2  HGB 13.9 9.9*  HCT 42.6 30.7*  PLT 380 222    COAGS:  Recent Labs  09/09/17 0827  INR 1.02  APTT 26    BMP:  Recent Labs  07/13/17 2302 09/09/17 0827  NA 138 140  K 4.6 3.7  CL 109 109  CO2 19* 23  GLUCOSE 98 112*  BUN 26* 19  CALCIUM 10.0 8.9  CREATININE 1.42* 1.43*  GFRNONAA 48* 48*  GFRAA 56* 55*    LIVER FUNCTION TESTS:  Recent Labs  07/13/17 2302  BILITOT 0.8  AST 22  ALT 20  ALKPHOS 97  PROT 8.3*  ALBUMIN 4.2    TUMOR MARKERS: No results for input(s): AFPTM, CEA, CA199, CHROMGRNA in the last 8760 hours.  Assessment and Plan:  Recent CVA Left jaw pain-- trigeminal neuropathy Noted cerebral artery stenosis on CTA Now scheduled for cerebral arteriogram Risks and benefits of cerebral arteriogram were discussed with the patient including, but not limited to bleeding, infection, vascular injury, contrast induced renal failure, stroke or even death. This interventional procedure involves the use of X-rays and because of the nature of the planned procedure, it is possible that we will have prolonged use of X-ray fluoroscopy. Potential radiation risks to you include (but are not limited to) the following: - A slightly elevated risk for cancer  several years later in life. This risk is typically less than 0.5% percent. This risk is low in comparison to the normal incidence of human cancer, which is 33% for women and 50% for men according to the [employer]. - Radiation induced injury can include skin redness, resembling a rash, tissue breakdown / ulcers and hair loss (which can be temporary or permanent).  The likelihood of either of these occurring depends on the difficulty of the procedure and whether you are sensitive to radiation due to previous  procedures, disease, or genetic conditions.  IF your procedure requires a prolonged use of radiation, you will be notified and given written instructions for further action.  It is your responsibility to monitor the irradiated area for the 2 weeks following the procedure and to notify your physician if you are concerned that you have suffered a radiation induced injury.    All of the patient's questions were answered, patient is agreeable to proceed. Consent signed and in chart.    Thank you for this interesting consult.  I greatly enjoyed meeting Ronald Wolf and look forward to participating in their care.  A copy of this report was sent to the requesting provider on this date.  Electronically Signed: Lavonia Drafts, PA-C 09/09/2017, 9:43 AM   I spent a total of  30 Minutes   in face to face in clinical consultation, greater than 50% of which was counseling/coordinating care for cerebral arteriogram

## 2017-09-09 NOTE — Procedures (Signed)
S/P 4 vessel cerebral arteriogram. RT CFA approach. Findings. 1.LVA origin  75 % stenosis . 2.70 to 80 % stenosis  LT VBJ. 3.RT MCA M1 50 to 70 % stenosis . 4. Lt  MCA 75 to 80 % stenosis. 5.. LIT ICA 50 % petrous cav junction ,and 60 % stenosis Lt ICA caval cav junction. LT

## 2017-09-09 NOTE — Discharge Instructions (Signed)
Cerebral Angiogram, Care After °Refer to this sheet in the next few weeks. These instructions provide you with information on caring for yourself after your procedure. Your health care provider may also give you more specific instructions. Your treatment has been planned according to current medical practices, but problems sometimes occur. Call your health care provider if you have any problems or questions after your procedure. °What can I expect after the procedure? °After your procedure, it is typical to have the following: °· Bruising at the catheter insertion site that usually fades within 1-2 weeks. °· Blood collecting in the tissue (hematoma) that may be painful to the touch. It should usually decrease in size and tenderness within 1-2 weeks. °· A mild headache. ° °Follow these instructions at home: °· Take medicines only as directed by your health care provider. °· You may shower 24-48 hours after the procedure or as directed by your health care provider. Remove the bandage (dressing) and gently wash the site with plain soap and water. Pat the area dry with a clean towel. Do not rub the site, because this may cause bleeding. °· Do not take baths, swim, or use a hot tub until your health care provider approves. °· Check your insertion site every day for redness, swelling, or drainage. °· Do not apply powder or lotion to the site. °· Do not lift over 10 lb (4.5 kg) for 5 days after your procedure or as directed by your health care provider. °· Ask your health care provider when it is okay to: °? Return to work or school. °? Resume usual physical activities or sports. °? Resume sexual activity. °· Do not drive home if you are discharged the same day as the procedure. Have someone else drive you. °· You may drive 24 hours after the procedure unless otherwise instructed by your health care provider. °· Do not operate machinery or power tools for 24 hours after the procedure or as directed by your health care  provider. °· If your procedure was done as an outpatient procedure, which means that you went home the same day as your procedure, a responsible adult should be with you for the first 24 hours after you arrive home. °· Keep all follow-up visits as directed by your health care provider. This is important. °Contact a health care provider if: °· You have a fever. °· You have chills. °· You have increased bleeding from the catheter insertion site. Hold pressure on the site. °Get help right away if: °· You have vision changes or loss of vision. °· You have numbness or weakness on one side of your body. °· You have difficulty talking, or you have slurred speech or cannot speak (aphasia). °· You feel confused or have difficulty remembering. °· You have unusual pain at the catheter insertion site. °· You have redness, warmth, or swelling at the catheter insertion site. °· You have drainage (other than a small amount of blood on the dressing) from the catheter insertion site. °· The catheter insertion site is bleeding, and the bleeding does not stop after 30 minutes of holding steady pressure on the site. °These symptoms may represent a serious problem that is an emergency. Do not wait to see if the symptoms will go away. Get medical help right away. Call your local emergency services (911 in [place] Do not drive yourself to the hospital. °This information is not intended to replace advice given to you by your health care provider. Make sure you discuss any questions   you have with your health care provider. °Document Released: 04/15/2014 Document Revised: 05/06/2016 Document Reviewed: 12/12/2013 °Elsevier Interactive Patient Education © 2017 Elsevier Inc. ° °

## 2017-09-09 NOTE — Sedation Documentation (Signed)
[place] removed per MD

## 2017-09-12 ENCOUNTER — Encounter (HOSPITAL_COMMUNITY): Payer: Self-pay | Admitting: Interventional Radiology

## 2017-09-13 ENCOUNTER — Telehealth (HOSPITAL_COMMUNITY): Payer: Self-pay

## 2017-09-13 NOTE — Telephone Encounter (Signed)
Called to schedule f/u, left message for pt to return call. AW 

## 2017-09-14 ENCOUNTER — Other Ambulatory Visit (HOSPITAL_COMMUNITY): Payer: Self-pay | Admitting: Interventional Radiology

## 2017-09-14 DIAGNOSIS — I771 Stricture of artery: Secondary | ICD-10-CM

## 2017-09-20 ENCOUNTER — Other Ambulatory Visit: Payer: Self-pay | Admitting: Internal Medicine

## 2017-09-20 ENCOUNTER — Ambulatory Visit (INDEPENDENT_AMBULATORY_CARE_PROVIDER_SITE_OTHER): Payer: Medicare Other

## 2017-09-20 ENCOUNTER — Telehealth: Payer: Self-pay | Admitting: Internal Medicine

## 2017-09-20 DIAGNOSIS — R002 Palpitations: Secondary | ICD-10-CM | POA: Diagnosis not present

## 2017-09-20 DIAGNOSIS — R410 Disorientation, unspecified: Secondary | ICD-10-CM

## 2017-09-20 DIAGNOSIS — R9431 Abnormal electrocardiogram [ECG] [EKG]: Secondary | ICD-10-CM

## 2017-09-20 DIAGNOSIS — I639 Cerebral infarction, unspecified: Secondary | ICD-10-CM

## 2017-09-20 NOTE — Telephone Encounter (Signed)
Sure, happy to see him

## 2017-09-20 NOTE — Telephone Encounter (Signed)
Caller name:Wade,[name] Relation to pt: daughter   Call back number: [phone]   Reason for call:  Patient would like to re establish with you available

## 2017-09-21 NOTE — Telephone Encounter (Signed)
Daughter informed awaiting call back

## 2017-09-22 NOTE — Telephone Encounter (Signed)
Patient scheduled with Dr. Charlett Blake for Thu 11/10/2017 at 11:15am

## 2017-10-06 ENCOUNTER — Ambulatory Visit (HOSPITAL_COMMUNITY): Payer: Medicare Other

## 2017-10-20 ENCOUNTER — Ambulatory Visit (HOSPITAL_COMMUNITY): Admission: RE | Admit: 2017-10-20 | Payer: Medicare Other | Source: Ambulatory Visit

## 2017-11-10 ENCOUNTER — Ambulatory Visit: Payer: Medicare Other | Admitting: Family Medicine

## 2017-11-29 ENCOUNTER — Telehealth: Payer: Self-pay | Admitting: Family Medicine

## 2017-11-29 NOTE — Telephone Encounter (Signed)
Copied from [place] [id number]. Topic: Appointment Scheduling - Scheduling Inquiry for Clinic >> Nov 29, 2017  3:51 PM Neva Seat wrote: Pt's daughter [name] called needing help with her dad.  She states he has been diagnosed with Neuralgia - not eating  - losing weight- and when he can eat he has severe abdominal pain.  His New Pt visit isn't until Feb. 7th with Dr. Charlett Blake.  He is requesting to see her... If he can be seen sooner...  I Rolley Sims,  spoke with Pt Mr. Ronald Wolf who gave [name], his daughter permission to speak with [place] about his medical needs on his behalf.  Her cell [phone]. I also advised her to have her dad to sign a release of info form for future medical issues.

## 2017-12-01 NOTE — Telephone Encounter (Signed)
I am willing to see him sooner than February but am out of the office next week. The following week am willing to come in early or stay late to get started but if he is having severe abdominal pain I am not sure he should wiat. He may need to see the ER or another provider urgently.

## 2017-12-01 NOTE — Telephone Encounter (Signed)
Please advise 

## 2017-12-05 NOTE — Telephone Encounter (Signed)
We only have 16 so far for that day

## 2017-12-05 NOTE — Telephone Encounter (Signed)
Spoke with patients daughter she stated it is hard getting him up in the early mornings.   Set an appointment with you for Jan 8th at 6pm I did tell her about our grace period.

## 2017-12-07 NOTE — Telephone Encounter (Signed)
Yes go ahead and add him

## 2017-12-20 ENCOUNTER — Ambulatory Visit: Payer: Medicare Other | Admitting: Family Medicine

## 2017-12-20 ENCOUNTER — Encounter: Payer: Self-pay | Admitting: Family Medicine

## 2017-12-20 VITALS — BP 138/84 | HR 79 | Temp 97.9°F | Resp 18 | Ht 71.0 in | Wt 157.6 lb

## 2017-12-20 DIAGNOSIS — Z79899 Other long term (current) drug therapy: Secondary | ICD-10-CM

## 2017-12-20 DIAGNOSIS — R350 Frequency of micturition: Secondary | ICD-10-CM

## 2017-12-20 DIAGNOSIS — J301 Allergic rhinitis due to pollen: Secondary | ICD-10-CM

## 2017-12-20 DIAGNOSIS — R05 Cough: Secondary | ICD-10-CM

## 2017-12-20 DIAGNOSIS — Z87440 Personal history of urinary (tract) infections: Secondary | ICD-10-CM

## 2017-12-20 DIAGNOSIS — C320 Malignant neoplasm of glottis: Secondary | ICD-10-CM | POA: Diagnosis not present

## 2017-12-20 DIAGNOSIS — G500 Trigeminal neuralgia: Secondary | ICD-10-CM

## 2017-12-20 DIAGNOSIS — R200 Anesthesia of skin: Secondary | ICD-10-CM

## 2017-12-20 DIAGNOSIS — G8929 Other chronic pain: Secondary | ICD-10-CM

## 2017-12-20 DIAGNOSIS — Z23 Encounter for immunization: Secondary | ICD-10-CM | POA: Diagnosis not present

## 2017-12-20 DIAGNOSIS — R739 Hyperglycemia, unspecified: Secondary | ICD-10-CM

## 2017-12-20 DIAGNOSIS — M898X1 Other specified disorders of bone, shoulder: Secondary | ICD-10-CM

## 2017-12-20 DIAGNOSIS — I639 Cerebral infarction, unspecified: Secondary | ICD-10-CM | POA: Insufficient documentation

## 2017-12-20 DIAGNOSIS — R002 Palpitations: Secondary | ICD-10-CM

## 2017-12-20 DIAGNOSIS — I10 Essential (primary) hypertension: Secondary | ICD-10-CM | POA: Diagnosis not present

## 2017-12-20 DIAGNOSIS — F1099 Alcohol use, unspecified with unspecified alcohol-induced disorder: Secondary | ICD-10-CM

## 2017-12-20 DIAGNOSIS — E785 Hyperlipidemia, unspecified: Secondary | ICD-10-CM

## 2017-12-20 DIAGNOSIS — R059 Cough, unspecified: Secondary | ICD-10-CM

## 2017-12-20 LAB — POC URINALSYSI DIPSTICK (AUTOMATED)
Bilirubin, UA: NEGATIVE
Blood, UA: NEGATIVE
Glucose, UA: NEGATIVE
Ketones, UA: NEGATIVE
Nitrite, UA: NEGATIVE
Protein, UA: POSITIVE
Spec Grav, UA: 1.01 (ref 1.010–1.025)
Urobilinogen, UA: 0.2 E.U./dL
pH, UA: 7 (ref 5.0–8.0)

## 2017-12-20 MED ORDER — LIDOCAINE VISCOUS 2 % MT SOLN: 20.0000 mL | OROMUCOSAL | 0 refills | Status: DC | PRN

## 2017-12-20 MED ORDER — TRAMADOL HCL 50 MG PO TABS: 50.0000 mg | ORAL_TABLET | Freq: Three times a day (TID) | ORAL | 0 refills | Status: DC | PRN

## 2017-12-20 NOTE — Assessment & Plan Note (Signed)
Seasonal

## 2017-12-20 NOTE — Assessment & Plan Note (Signed)
move

## 2017-12-20 NOTE — Progress Notes (Signed)
Subjective:  I acted as a Education administrator for Dr. Charlett Blake. [place], [place]  Patient ID: Ronald Wolf, male    DOB: 1946/07/26, 72 y.o.   MRN: [id number]  No chief complaint on file.   HPI  Patient is in today to re establish care and is accompanied by his daughters.  His ongoing greatest complaint is of his severe trigeminal neuralgia pain on the left side of his face.  They actually already have an appointment scheduled with a neurologist at [facility] but have not gone for yet.  The carbamazepine helps to manage each day but he still has breakthrough pain for which he uses medications on occasion.  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 Redmond Baseman although he has been lost to follow-up.  Does have some throat pain at times.  Denies any dysphagia or bleeding.  He does note that the pain in his jaw is bad enough that he does not eat regularly.  No recent febrile illness or hospitalizations.  He has noted some urinary frequency but no dysuria or hematuria.  No change in bowel habits.  Has missed some medications the last few days. Denies CP/palp/SOB/HA/congestion/fevers/GI c/o. Taking meds as prescribed  Patient Care Team: Velna Hatchet, MD as PCP - General (Internal Medicine)   Past Medical History:  Diagnosis Date  . Abdominal pain, other specified site 09/26/2013  . Anxiety   . Arthritis   . Atherosclerosis of arteries 05/13/2013  . Cough 09/26/2013  . Depression   . Eating disorder   . GERD (gastroesophageal reflux disease)   . Hepatitis C   . Hypertension   . Lesion of liver 04/01/2013  . Loss of weight 06/21/2014  . Medial knee pain 09/26/2013  . Palpitations 06/21/2014  . Shortness of breath dyspnea    with exertion    Past Surgical History:  Procedure Laterality Date  . IR ANGIO INTRA EXTRACRAN SEL COM CAROTID INNOMINATE BILAT MOD SED  09/09/2017  . IR ANGIO VERTEBRAL SEL VERTEBRAL BILAT MOD SED   09/09/2017  . IR RADIOLOGIST EVAL & MGMT  09/07/2017  . MICROLARYNGOSCOPY WITH CO2 LASER AND EXCISION OF VOCAL CORD LESION N/A 05/14/2016   Procedure: MICROLARYNGOSCOPY WITH CO2 LASER AND EXCISION OF VOCAL CORD LESION;  Surgeon: Melida Quitter, MD;  Location: [place];  Service: ENT;  Laterality: N/A;  Suspended micro laryngoscopy with CO2 laser excision of vocal fold lesion  . TUMOR REMOVAL  1990   bladder    Family History  Problem Relation Age of Onset  . Diabetes Mother   . Arthritis Mother   . Healthy Sister        x 2  . Cancer Brother        stomach  . Dementia Father   . Prostate cancer Neg Hx   . Colon cancer Neg Hx   . Breast cancer Neg Hx   . Heart disease Neg Hx   . Hypertension Neg Hx     Social History   Socioeconomic History  . Marital status: Single    Spouse name: Not on file  . Number of children: 6  . Years of education: 38  . Highest education level: Not on file  Social Needs  . Financial resource strain: Not on file  . Food insecurity - worry: Not on file  . Food insecurity - inability: Not on file  . Transportation needs - medical: Not on  file  . Transportation needs - non-medical: Not on file  Occupational History  . Not on file  Tobacco Use  . Smoking status: Current Every Day Smoker    Packs/day: 1.00    Years: 35.00    Pack years: 35.00    Types: Cigarettes  . Smokeless tobacco: Never Used  Substance and Sexual Activity  . Alcohol use: Yes    Alcohol/week: 0.0 oz    Comment: 11 pint daily    none in a month and a half  . Drug use: No  . Sexual activity: Not on file  Other Topics Concern  . Not on file  Social History Narrative   Patient drinks caffeine occasionally.   Patient is right handed   Lives alone   Wears seat belts    Outpatient Medications Prior to Visit  Medication Sig Dispense Refill  . aspirin EC 81 MG tablet Take 81 mg by mouth daily.    Marland Kitchen atorvastatin (LIPITOR) 20 MG tablet Take 1 tablet (20 mg total) by mouth daily. 90  tablet 3  . carbamazepine (CARBATROL) 100 MG 12 hr capsule Take 200 mg by mouth 2 (two) times daily.     Marland Kitchen losartan (COZAAR) 100 MG tablet   0  . tetrahydrozoline-zinc (VISINE-AC) 0.05-0.25 % ophthalmic solution Place 1 drop into both eyes daily as needed.    Marland Kitchen albuterol (PROVENTIL HFA;VENTOLIN HFA) 108 (90 BASE) MCG/ACT inhaler Inhale 1-2 puffs into the lungs every 6 (six) hours as needed for wheezing or shortness of breath. (Patient not taking: Reported on 11/16/2016) 1 Inhaler 0  . buPROPion (WELLBUTRIN XL) 300 MG 24 hr tablet Take 1 tablet (300 mg total) by mouth daily. 30 tablet 3  . fish oil-omega-3 fatty acids 1000 MG capsule Take 2 g by mouth daily as needed (for supplementatioin).     Marland Kitchen HYDROcodone-acetaminophen (NORCO/VICODIN) 5-325 MG tablet Take 1 tablet by mouth every 6 (six) hours as needed for moderate pain. 20 tablet 0  . losartan (COZAAR) 50 MG tablet take 1 tablet by mouth once daily 15 tablet 0  . omeprazole (PRILOSEC) 40 MG capsule Take 40 mg by mouth daily as needed (for acid reflux or heartburn).   0   No facility-administered medications prior to visit.     Allergies  Allergen Reactions  . Iohexol Hives     Desc: pt broke out in hives years ago from iv contrast. he was given 53m benadryl po, 1 hr prior to ct and did fine today w/o complications.  JB     Review of Systems  Constitutional: Positive for malaise/fatigue. Negative for chills and fever.  HENT: Negative for congestion and hearing loss.   Eyes: Negative for discharge.  Respiratory: Negative for cough, sputum production and shortness of breath.   Cardiovascular: Negative for chest pain, palpitations and leg swelling.  Gastrointestinal: Negative for abdominal pain, blood in stool, constipation, diarrhea, heartburn, nausea and vomiting.  Genitourinary: Negative for dysuria, frequency, hematuria and urgency.  Musculoskeletal: Positive for joint pain. Negative for back pain, falls and myalgias.  Skin: Negative  for rash.  Neurological: Positive for headaches. Negative for dizziness, sensory change, loss of consciousness and weakness.  Endo/Heme/Allergies: Negative for environmental allergies. Does not bruise/bleed easily.  Psychiatric/Behavioral: Positive for depression. Negative for suicidal ideas. The patient is not nervous/anxious and does not have insomnia.        Objective:    Physical Exam  Constitutional: He is oriented to person, place, and time. He appears well-developed and  well-nourished. No distress.  HENT:  Head: Normocephalic and atraumatic.  Eyes: Conjunctivae are normal.  Neck: Neck supple. No thyromegaly present.  Cardiovascular: Normal rate, regular rhythm and normal heart sounds.  No murmur heard. Pulmonary/Chest: Effort normal and breath sounds normal. No respiratory distress. He has no wheezes.  Abdominal: Soft. Bowel sounds are normal. He exhibits no mass. There is no tenderness.  Musculoskeletal: He exhibits no edema.  Lymphadenopathy:    He has no cervical adenopathy.  Neurological: He is alert and oriented to person, place, and time.  Skin: Skin is warm and dry.  Psychiatric: He has a normal mood and affect. His behavior is normal.    BP 138/84   Pulse 79   Temp 97.9 F (36.6 C) (Oral)   Resp 18   Ht 5' 11"  (1.803 m)   Wt 157 lb 9.6 oz (71.5 kg)   SpO2 100%   BMI 21.98 kg/m  Wt Readings from Last 3 Encounters:  12/20/17 157 lb 9.6 oz (71.5 kg)  09/09/17 165 lb (74.8 kg)  07/13/17 165 lb (74.8 kg)   BP Readings from Last 3 Encounters:  12/25/17 138/84  09/09/17 (!) 144/71  07/13/17 (!) 191/102     Immunization History  Administered Date(s) Administered  . Influenza Split 09/14/2012  . Influenza, High Dose Seasonal PF 09/24/2013, 09/05/2015, 12/20/2017  . Influenza,inj,Quad PF,6+ Mos 10/23/2014  . Pneumococcal Conjugate-13 10/23/2014    Health Maintenance  Topic Date Due  . TETANUS/TDAP  02/11/1965  . PNA vac Low Risk Adult (2 of 2 - PPSV23)  10/24/2015  . COLONOSCOPY  11/17/2019  . INFLUENZA VACCINE  Completed  . Hepatitis C Screening  Completed    Lab Results  Component Value Date   WBC 8.3 12/20/2017   HGB 13.5 12/20/2017   HCT 40.6 12/20/2017   PLT 355.0 12/20/2017   GLUCOSE 102 (H) 12/20/2017   CHOL 185 12/20/2017   TRIG 198.0 (H) 12/20/2017   HDL 46.90 12/20/2017   LDLCALC 99 12/20/2017   ALT 25 12/20/2017   AST 26 12/20/2017   NA 141 12/20/2017   K 4.8 12/20/2017   CL 107 12/20/2017   CREATININE 1.02 12/20/2017   BUN 20 12/20/2017   CO2 24 12/20/2017   TSH 1.46 12/20/2017   PSA 0.01 (L) 11/21/2014   INR 1.02 09/09/2017   HGBA1C 4.9 12/20/2017    Lab Results  Component Value Date   TSH 1.46 12/20/2017   Lab Results  Component Value Date   WBC 8.3 12/20/2017   HGB 13.5 12/20/2017   HCT 40.6 12/20/2017   MCV 108.8 (H) 12/20/2017   PLT 355.0 12/20/2017   Lab Results  Component Value Date   NA 141 12/20/2017   K 4.8 12/20/2017   CHLORIDE 108 03/08/2016   CO2 24 12/20/2017   GLUCOSE 102 (H) 12/20/2017   BUN 20 12/20/2017   CREATININE 1.02 12/20/2017   BILITOT 0.6 12/20/2017   ALKPHOS 118 (H) 12/20/2017   AST 26 12/20/2017   ALT 25 12/20/2017   PROT 7.9 12/20/2017   ALBUMIN 4.1 12/20/2017   CALCIUM 10.1 12/20/2017   ANIONGAP 8 09/09/2017   EGFR >90 03/08/2016   GFR 92.37 12/20/2017   Lab Results  Component Value Date   CHOL 185 12/20/2017   Lab Results  Component Value Date   HDL 46.90 12/20/2017   Lab Results  Component Value Date   LDLCALC 99 12/20/2017   Lab Results  Component Value Date   TRIG 198.0 (H) 12/20/2017  Lab Results  Component Value Date   CHOLHDL 4 12/20/2017   Lab Results  Component Value Date   HGBA1C 4.9 12/20/2017         Assessment & Plan:   Problem List Items Addressed This Visit    Palpitations (Chronic)    On occasion mostly after smoking 2 much      Trigeminal neuralgia    Has struggled with significant pain for years. Manages it  with carbazepine but still has pain. Has an appt soon with [facility] neurology for further consideration.       Relevant Orders   Ambulatory referral to Neurology   HTN (hypertension)    Well controlled, no changes to meds. Encouraged heart healthy diet such as the DASH diet and exercise as tolerated.       Relevant Medications   atorvastatin (LIPITOR) 20 MG tablet   Other Relevant Orders   CBC (Completed)   Comprehensive metabolic panel (Completed)   TSH (Completed)   RESOLVED: Lt facial numbness   Relevant Orders   Ambulatory referral to Neurology   Rhinitis    Seasonal       Chronic scapular pain    Has tried chiropractic but gets minimal relief at times. Try topical lidocaine patches      Cough    move      Relevant Orders   Ambulatory referral to ENT   Hyperglycemia - Primary    minimize simple carbs. Increase exercise as tolerated.      Relevant Orders   Hemoglobin A1c (Completed)   History of UTI    Urine culture negative. UA unremarkable      Stroke [facility])    Last event back in July      Relevant Medications   atorvastatin (LIPITOR) 20 MG tablet   Cancer of glottis [facility])    Has followed with ENT Dr Redmond Baseman in the past but had been lost to follow up. He agrees to return referral is placed.       Relevant Orders   Ambulatory referral to ENT   Alcohol use, unspecified with unspecified alcohol-induced disorder ([place])    Encouraged cessation, discussed need to titrate down or consider treatment. He is accompanied by his daughters during this visit to reestablish as a patient here after several years.        Other Visit Diagnoses    High risk medication use       Relevant Orders   Pain Mgmt, Profile 8 w/Conf, U (Completed)   Hyperlipidemia, unspecified hyperlipidemia type       Relevant Medications   atorvastatin (LIPITOR) 20 MG tablet   Other Relevant Orders   Lipid panel (Completed)   Urine frequency       Relevant Orders   Urine Culture (Completed)    POCT Urinalysis Dipstick (Automated) (Completed)   Needs flu shot       Relevant Orders   Flu vaccine HIGH DOSE PF (Fluzone High dose) (Completed)      I have discontinued Danney L. Bredeson's fish oil-omega-3 fatty acids, albuterol, buPROPion, omeprazole, and HYDROcodone-acetaminophen. I am also having him start on traMADol and lidocaine. Additionally, I am having him maintain his tetrahydrozoline-zinc, carbamazepine, aspirin EC, losartan, and atorvastatin.  Meds ordered this encounter  Medications  . traMADol (ULTRAM) 50 MG tablet    Sig: Take 1 tablet (50 mg total) by mouth 3 (three) times daily as needed.    Dispense:  90 tablet    Refill:  0  .  lidocaine (XYLOCAINE) 2 % solution    Sig: Use as directed 20 mLs in the mouth or throat as needed for mouth pain.    Dispense:  100 mL    Refill:  0    CMA served as scribe during this visit. History, Physical and Plan performed by medical provider. Documentation and orders reviewed and attested to.  Penni Homans, MD

## 2017-12-20 NOTE — Assessment & Plan Note (Signed)
On occasion mostly after smoking 2 much

## 2017-12-20 NOTE — Assessment & Plan Note (Signed)
Has tried chiropractic but gets minimal relief at times. Try topical lidocaine patches

## 2017-12-20 NOTE — Assessment & Plan Note (Signed)
Last event back in July

## 2017-12-20 NOTE — Patient Instructions (Addendum)
Vitamin B complex daily Aspercreme, salon pas and icy hot companies make Lidocaine gel  Move Omeprazole to evening and chew two Tums at bed  At next visit here pneumovax pneumonia   Tetanus and the shingles called Shingrix it is 2 shots over a 2 to 6 month window at pharmacy Trigeminal Neuralgia Trigeminal neuralgia is a nerve disorder that causes attacks of severe facial pain. The attacks last from a few seconds to several minutes. They can happen for days, weeks, or months and then go away for months or years. Trigeminal neuralgia is also called tic douloureux. What are the causes? This condition is caused by damage to a nerve in the face that is called the trigeminal nerve. An attack can be triggered by:  Talking.  Chewing.  Putting on makeup.  Washing your face.  Shaving your face.  Brushing your teeth.  Touching your face.  What increases the risk? This condition is more likely to develop in:  Women.  People who are 82 years of age or older.  What are the signs or symptoms? The main symptom of this condition is pain in the jaw, lips, eyes, nose, scalp, forehead, and face. The pain may be intense, stabbing, electric, or shock-like. How is this diagnosed? This condition is diagnosed with a physical exam. A CT scan or MRI may be done to rule out other conditions that can cause facial pain. How is this treated? This condition may be treated with:  Avoiding the things that trigger your attacks.  Pain medicine.  Surgery. This may be done in severe cases if other medical treatment does not provide relief.  Follow these instructions at home:  Take over-the-counter and prescription medicines only as told by your health care provider.  If you wish to get pregnant, talk with your health care provider before you start trying to get pregnant.  Avoid the things that trigger your attacks. It may help to: ? Chew on the unaffected side of your mouth. ? Avoid touching your  face. ? Avoid blasts of hot or cold air. Contact a health care provider if:  Your pain medicine is not helping.  You develop new, unexplained symptoms, such as: ? Double vision. ? Facial weakness. ? Changes in hearing or balance.  You become pregnant. Get help right away if:  Your pain is unbearable, and your pain medicine does not help. This information is not intended to replace advice given to you by your health care provider. Make sure you discuss any questions you have with your health care provider. Document Released: 11/26/2000 Document Revised: 08/01/2016 Document Reviewed: 03/24/2015 Elsevier Interactive Patient Education  [employer].

## 2017-12-21 LAB — COMPREHENSIVE METABOLIC PANEL
ALT: 25 U/L (ref 0–53)
AST: 26 U/L (ref 0–37)
Albumin: 4.1 g/dL (ref 3.5–5.2)
Alkaline Phosphatase: 118 U/L — ABNORMAL HIGH (ref 39–117)
BUN: 20 mg/dL (ref 6–23)
CO2: 24 mEq/L (ref 19–32)
Calcium: 10.1 mg/dL (ref 8.4–10.5)
Chloride: 107 mEq/L (ref 96–112)
Creatinine, Ser: 1.02 mg/dL (ref 0.40–1.50)
GFR: 92.37 mL/min (ref >60.00)
Glucose, Bld: 102 mg/dL — ABNORMAL HIGH (ref 70–99)
Potassium: 4.8 mEq/L (ref 3.5–5.1)
Sodium: 141 mEq/L (ref 135–145)
Total Bilirubin: 0.6 mg/dL (ref 0.2–1.2)
Total Protein: 7.9 g/dL (ref 6.0–8.3)

## 2017-12-21 LAB — CBC
HCT: 40.6 % (ref 39.0–52.0)
Hemoglobin: 13.5 g/dL (ref 13.0–17.0)
MCHC: 33.2 g/dL (ref 30.0–36.0)
MCV: 108.8 fl — ABNORMAL HIGH (ref 78.0–100.0)
Platelets: 355 10*3/uL (ref 150.0–400.0)
RBC: 3.73 Mil/uL — ABNORMAL LOW (ref 4.22–5.81)
RDW: 12.9 % (ref 11.5–15.5)
WBC: 8.3 10*3/uL (ref 4.0–10.5)

## 2017-12-21 LAB — LIPID PANEL
Cholesterol: 185 mg/dL (ref 0–200)
HDL: 46.9 mg/dL (ref >39.00)
LDL Cholesterol: 99 mg/dL (ref 0–99)
NonHDL: 138.13
Total CHOL/HDL Ratio: 4
Triglycerides: 198 mg/dL — ABNORMAL HIGH (ref 0.0–149.0)
VLDL: 39.6 mg/dL (ref 0.0–40.0)

## 2017-12-21 LAB — HEMOGLOBIN A1C: Hgb A1c MFr Bld: 4.9 % (ref 4.6–6.5)

## 2017-12-21 LAB — TSH: TSH: 1.46 u[IU]/mL (ref 0.35–4.50)

## 2017-12-22 LAB — URINE CULTURE
MICRO NUMBER:: 90034815
SPECIMEN QUALITY:: ADEQUATE

## 2017-12-24 LAB — PAIN MGMT, PROFILE 8 W/CONF, U
6 Acetylmorphine: NEGATIVE ng/mL (ref <10)
Alcohol Metabolites: POSITIVE ng/mL — AB (ref <500)
Amphetamines: NEGATIVE ng/mL (ref <500)
Benzodiazepines: NEGATIVE ng/mL (ref <100)
Buprenorphine, Urine: NEGATIVE ng/mL (ref <5)
Cocaine Metabolite: NEGATIVE ng/mL (ref <150)
Creatinine: 67.9 mg/dL
Ethyl Glucuronide (ETG): 57759 ng/mL — ABNORMAL HIGH (ref <500)
Ethyl Sulfate (ETS): 19376 ng/mL — ABNORMAL HIGH (ref <100)
MDMA: NEGATIVE ng/mL (ref <500)
Marijuana Metabolite: NEGATIVE ng/mL (ref <20)
Opiates: NEGATIVE ng/mL (ref <100)
Oxidant: NEGATIVE ug/mL (ref <200)
Oxycodone: NEGATIVE ng/mL (ref <100)
pH: 8.04 (ref 4.5–9.0)

## 2017-12-25 DIAGNOSIS — C320 Malignant neoplasm of glottis: Secondary | ICD-10-CM | POA: Insufficient documentation

## 2017-12-25 DIAGNOSIS — F1099 Alcohol use, unspecified with unspecified alcohol-induced disorder: Secondary | ICD-10-CM | POA: Insufficient documentation

## 2017-12-25 NOTE — Assessment & Plan Note (Signed)
Has struggled with significant pain for years. Manages it with carbazepine but still has pain. Has an appt soon with [facility] neurology for further consideration.

## 2017-12-25 NOTE — Assessment & Plan Note (Signed)
Well controlled, no changes to meds. Encouraged heart healthy diet such as the DASH diet and exercise as tolerated.  °

## 2017-12-25 NOTE — Assessment & Plan Note (Signed)
minimize simple carbs. Increase exercise as tolerated.  

## 2017-12-25 NOTE — Assessment & Plan Note (Signed)
Urine culture negative. UA unremarkable

## 2017-12-25 NOTE — Assessment & Plan Note (Signed)
Encouraged cessation, discussed need to titrate down or consider treatment. He is accompanied by his daughters during this visit to reestablish as a patient here after several years.

## 2017-12-25 NOTE — Assessment & Plan Note (Signed)
Has followed with ENT Dr Redmond Baseman in the past but had been lost to follow up. He agrees to return referral is placed.

## 2018-01-12 ENCOUNTER — Telehealth: Payer: Self-pay | Admitting: Family Medicine

## 2018-01-12 NOTE — Telephone Encounter (Signed)
Copied from [place] [id number]. Topic: Quick Communication - See Telephone Encounter >> Jan 12, 2018  9:47 AM Burnis Medin, NT wrote: CRM for notification. See Telephone encounter for: Adela Lank is calling to see if the doctor or nurse could give her a call for pre authorization for some medications that the patient has requested. Medications are calcipotrine, hydrocortisone valerate cream, mometasone, omega 3 and oxiconazole. She would like a call back. 01/12/18.

## 2018-01-13 NOTE — Telephone Encounter (Signed)
Spoke with pharmacy he uses a different pharmacy

## 2018-01-19 ENCOUNTER — Ambulatory Visit: Payer: Medicare Other | Admitting: Family Medicine

## 2018-02-23 DIAGNOSIS — Z510 Encounter for antineoplastic radiation therapy: Secondary | ICD-10-CM | POA: Diagnosis not present

## 2018-02-23 DIAGNOSIS — G500 Trigeminal neuralgia: Secondary | ICD-10-CM | POA: Diagnosis not present

## 2018-03-02 ENCOUNTER — Ambulatory Visit: Payer: Medicare Other | Admitting: Family Medicine

## 2018-03-08 ENCOUNTER — Other Ambulatory Visit: Payer: Self-pay | Admitting: Family Medicine

## 2018-03-08 NOTE — Telephone Encounter (Signed)
Last OV: 12/20/17 PCP: [place], [place] [phone] (Phone) [phone] (Fax)

## 2018-03-08 NOTE — Telephone Encounter (Signed)
Copied from [place] [id number]. Topic: Quick Communication - Rx Refill/Question >> Mar 08, 2018  4:15 PM Arletha Grippe wrote: Medication: lidocane 5% ointment solution Has the patient contacted their pharmacy? Yes.   (Agent: If no, request that the patient contact the pharmacy for the refill.) Preferred Pharmacy (with phone number or street name): [facility] [phone]  Agent: Please be advised that RX refills may take up to 3 business days. We ask that you follow-up with your pharmacy.

## 2018-03-14 MED ORDER — LIDOCAINE VISCOUS 2 % MT SOLN: 20.0000 mL | OROMUCOSAL | 0 refills | Status: DC | PRN

## 2018-04-11 ENCOUNTER — Ambulatory Visit: Payer: Self-pay | Admitting: Family Medicine

## 2018-04-11 ENCOUNTER — Telehealth (HOSPITAL_COMMUNITY): Payer: Self-pay

## 2018-04-11 NOTE — Telephone Encounter (Signed)
Called to schedule consult, no answer, left vm. AW  

## 2018-08-02 DIAGNOSIS — H2513 Age-related nuclear cataract, bilateral: Secondary | ICD-10-CM | POA: Diagnosis not present

## 2018-12-17 ENCOUNTER — Inpatient Hospital Stay (HOSPITAL_COMMUNITY)
Admission: EM | Admit: 2018-12-17 | Discharge: 2018-12-24 | DRG: 064 | Disposition: A | Discharge status: Skilled Nursing Facility | Payer: Medicare PPO | Attending: Family Medicine | Admitting: Family Medicine

## 2018-12-17 ENCOUNTER — Emergency Department (HOSPITAL_COMMUNITY): Payer: Medicare PPO

## 2018-12-17 DIAGNOSIS — R402362 Coma scale, best motor response, obeys commands, at arrival to emergency department: Secondary | ICD-10-CM | POA: Diagnosis present

## 2018-12-17 DIAGNOSIS — I6389 Other cerebral infarction: Secondary | ICD-10-CM | POA: Diagnosis not present

## 2018-12-17 DIAGNOSIS — I69320 Aphasia following cerebral infarction: Secondary | ICD-10-CM | POA: Diagnosis not present

## 2018-12-17 DIAGNOSIS — R29701 NIHSS score 1: Secondary | ICD-10-CM | POA: Diagnosis present

## 2018-12-17 DIAGNOSIS — F419 Anxiety disorder, unspecified: Secondary | ICD-10-CM | POA: Diagnosis present

## 2018-12-17 DIAGNOSIS — M2550 Pain in unspecified joint: Secondary | ICD-10-CM | POA: Diagnosis not present

## 2018-12-17 DIAGNOSIS — R402142 Coma scale, eyes open, spontaneous, at arrival to emergency department: Secondary | ICD-10-CM | POA: Diagnosis present

## 2018-12-17 DIAGNOSIS — Z79899 Other long term (current) drug therapy: Secondary | ICD-10-CM

## 2018-12-17 DIAGNOSIS — D649 Anemia, unspecified: Secondary | ICD-10-CM | POA: Diagnosis present

## 2018-12-17 DIAGNOSIS — Z7401 Bed confinement status: Secondary | ICD-10-CM | POA: Diagnosis not present

## 2018-12-17 DIAGNOSIS — Z9289 Personal history of other medical treatment: Secondary | ICD-10-CM

## 2018-12-17 DIAGNOSIS — I10 Essential (primary) hypertension: Secondary | ICD-10-CM | POA: Diagnosis not present

## 2018-12-17 DIAGNOSIS — D7589 Other specified diseases of blood and blood-forming organs: Secondary | ICD-10-CM | POA: Diagnosis present

## 2018-12-17 DIAGNOSIS — I672 Cerebral atherosclerosis: Secondary | ICD-10-CM | POA: Diagnosis present

## 2018-12-17 DIAGNOSIS — R402422 Glasgow coma scale score 9-12, at arrival to emergency department: Secondary | ICD-10-CM | POA: Diagnosis not present

## 2018-12-17 DIAGNOSIS — Z01818 Encounter for other preprocedural examination: Secondary | ICD-10-CM | POA: Diagnosis not present

## 2018-12-17 DIAGNOSIS — Z7982 Long term (current) use of aspirin: Secondary | ICD-10-CM

## 2018-12-17 DIAGNOSIS — G459 Transient cerebral ischemic attack, unspecified: Secondary | ICD-10-CM | POA: Diagnosis not present

## 2018-12-17 DIAGNOSIS — I160 Hypertensive urgency: Secondary | ICD-10-CM | POA: Diagnosis present

## 2018-12-17 DIAGNOSIS — T465X6A Underdosing of other antihypertensive drugs, initial encounter: Secondary | ICD-10-CM | POA: Diagnosis present

## 2018-12-17 DIAGNOSIS — Z9114 Patient's other noncompliance with medication regimen: Secondary | ICD-10-CM

## 2018-12-17 DIAGNOSIS — I63512 Cerebral infarction due to unspecified occlusion or stenosis of left middle cerebral artery: Secondary | ICD-10-CM | POA: Diagnosis not present

## 2018-12-17 DIAGNOSIS — Z87440 Personal history of urinary (tract) infections: Secondary | ICD-10-CM

## 2018-12-17 DIAGNOSIS — R402222 Coma scale, best verbal response, incomprehensible words, at arrival to emergency department: Secondary | ICD-10-CM | POA: Diagnosis present

## 2018-12-17 DIAGNOSIS — I639 Cerebral infarction, unspecified: Secondary | ICD-10-CM | POA: Diagnosis not present

## 2018-12-17 DIAGNOSIS — J322 Chronic ethmoidal sinusitis: Secondary | ICD-10-CM | POA: Diagnosis present

## 2018-12-17 DIAGNOSIS — J321 Chronic frontal sinusitis: Secondary | ICD-10-CM | POA: Diagnosis present

## 2018-12-17 DIAGNOSIS — N390 Urinary tract infection, site not specified: Secondary | ICD-10-CM | POA: Diagnosis not present

## 2018-12-17 DIAGNOSIS — Z888 Allergy status to other drugs, medicaments and biological substances status: Secondary | ICD-10-CM | POA: Diagnosis not present

## 2018-12-17 DIAGNOSIS — F17210 Nicotine dependence, cigarettes, uncomplicated: Secondary | ICD-10-CM | POA: Diagnosis present

## 2018-12-17 DIAGNOSIS — Z8551 Personal history of malignant neoplasm of bladder: Secondary | ICD-10-CM

## 2018-12-17 DIAGNOSIS — I471 Supraventricular tachycardia: Secondary | ICD-10-CM | POA: Diagnosis not present

## 2018-12-17 DIAGNOSIS — N179 Acute kidney failure, unspecified: Secondary | ICD-10-CM | POA: Diagnosis not present

## 2018-12-17 DIAGNOSIS — R2689 Other abnormalities of gait and mobility: Secondary | ICD-10-CM | POA: Diagnosis not present

## 2018-12-17 DIAGNOSIS — F17200 Nicotine dependence, unspecified, uncomplicated: Secondary | ICD-10-CM | POA: Diagnosis not present

## 2018-12-17 DIAGNOSIS — R4182 Altered mental status, unspecified: Secondary | ICD-10-CM

## 2018-12-17 DIAGNOSIS — M6281 Muscle weakness (generalized): Secondary | ICD-10-CM | POA: Diagnosis not present

## 2018-12-17 DIAGNOSIS — R404 Transient alteration of awareness: Secondary | ICD-10-CM | POA: Diagnosis not present

## 2018-12-17 DIAGNOSIS — K219 Gastro-esophageal reflux disease without esophagitis: Secondary | ICD-10-CM | POA: Diagnosis present

## 2018-12-17 DIAGNOSIS — F1010 Alcohol abuse, uncomplicated: Secondary | ICD-10-CM | POA: Diagnosis present

## 2018-12-17 DIAGNOSIS — R4701 Aphasia: Secondary | ICD-10-CM | POA: Diagnosis present

## 2018-12-17 DIAGNOSIS — I63312 Cerebral infarction due to thrombosis of left middle cerebral artery: Secondary | ICD-10-CM | POA: Diagnosis not present

## 2018-12-17 DIAGNOSIS — R5381 Other malaise: Secondary | ICD-10-CM | POA: Diagnosis not present

## 2018-12-17 DIAGNOSIS — F329 Major depressive disorder, single episode, unspecified: Secondary | ICD-10-CM | POA: Diagnosis present

## 2018-12-17 DIAGNOSIS — R933 Abnormal findings on diagnostic imaging of other parts of digestive tract: Secondary | ICD-10-CM | POA: Diagnosis not present

## 2018-12-17 DIAGNOSIS — B1920 Unspecified viral hepatitis C without hepatic coma: Secondary | ICD-10-CM | POA: Diagnosis present

## 2018-12-17 DIAGNOSIS — F1011 Alcohol abuse, in remission: Secondary | ICD-10-CM | POA: Diagnosis present

## 2018-12-17 DIAGNOSIS — D638 Anemia in other chronic diseases classified elsewhere: Secondary | ICD-10-CM | POA: Diagnosis present

## 2018-12-17 DIAGNOSIS — I4510 Unspecified right bundle-branch block: Secondary | ICD-10-CM | POA: Diagnosis not present

## 2018-12-17 DIAGNOSIS — R2681 Unsteadiness on feet: Secondary | ICD-10-CM | POA: Diagnosis not present

## 2018-12-17 DIAGNOSIS — R41841 Cognitive communication deficit: Secondary | ICD-10-CM | POA: Diagnosis not present

## 2018-12-17 DIAGNOSIS — IMO0001 Reserved for inherently not codable concepts without codable children: Secondary | ICD-10-CM | POA: Diagnosis present

## 2018-12-17 DIAGNOSIS — R52 Pain, unspecified: Secondary | ICD-10-CM | POA: Diagnosis not present

## 2018-12-17 LAB — URINALYSIS, ROUTINE W REFLEX MICROSCOPIC
Bilirubin Urine: NEGATIVE
Glucose, UA: NEGATIVE mg/dL
Hgb urine dipstick: NEGATIVE
Ketones, ur: NEGATIVE mg/dL
Nitrite: NEGATIVE
Protein, ur: 100 mg/dL — AB
Specific Gravity, Urine: 1.012 (ref 1.005–1.030)
pH: 9 — ABNORMAL HIGH (ref 5.0–8.0)

## 2018-12-17 LAB — DIFFERENTIAL
Abs Immature Granulocytes: 0.02 10*3/uL (ref 0.00–0.07)
Basophils Absolute: 0 10*3/uL (ref 0.0–0.1)
Basophils Relative: 0 %
Eosinophils Absolute: 0.1 10*3/uL (ref 0.0–0.5)
Eosinophils Relative: 1 %
Immature Granulocytes: 0 %
Lymphocytes Relative: 32 %
Lymphs Abs: 2.8 10*3/uL (ref 0.7–4.0)
Monocytes Absolute: 1.1 10*3/uL — ABNORMAL HIGH (ref 0.1–1.0)
Monocytes Relative: 13 %
Neutro Abs: 4.6 10*3/uL (ref 1.7–7.7)
Neutrophils Relative %: 54 %

## 2018-12-17 LAB — COMPREHENSIVE METABOLIC PANEL
ALT: 46 U/L — ABNORMAL HIGH (ref 0–44)
AST: 46 U/L — ABNORMAL HIGH (ref 15–41)
Albumin: 3.6 g/dL (ref 3.5–5.0)
Alkaline Phosphatase: 75 U/L (ref 38–126)
Anion gap: 11 (ref 5–15)
BUN: 18 mg/dL (ref 8–23)
CO2: 24 mmol/L (ref 22–32)
Calcium: 9.8 mg/dL (ref 8.9–10.3)
Chloride: 106 mmol/L (ref 98–111)
Creatinine, Ser: 0.98 mg/dL (ref 0.61–1.24)
GFR calc Af Amer: >60 mL/min (ref >60)
GFR calc non Af Amer: >60 mL/min (ref >60)
Glucose, Bld: 98 mg/dL (ref 70–99)
Potassium: 3.9 mmol/L (ref 3.5–5.1)
Sodium: 141 mmol/L (ref 135–145)
Total Bilirubin: 0.9 mg/dL (ref 0.3–1.2)
Total Protein: 7.8 g/dL (ref 6.5–8.1)

## 2018-12-17 LAB — CBC
HCT: 45 % (ref 39.0–52.0)
Hemoglobin: 14.5 g/dL (ref 13.0–17.0)
MCH: 35.8 pg — ABNORMAL HIGH (ref 26.0–34.0)
MCHC: 32.2 g/dL (ref 30.0–36.0)
MCV: 111.1 fL — ABNORMAL HIGH (ref 80.0–100.0)
Platelets: 267 10*3/uL (ref 150–400)
RBC: 4.05 MIL/uL — ABNORMAL LOW (ref 4.22–5.81)
RDW: 12.8 % (ref 11.5–15.5)
WBC: 8.5 10*3/uL (ref 4.0–10.5)
nRBC: 0 % (ref 0.0–0.2)

## 2018-12-17 LAB — PROTIME-INR
INR: 0.95
Prothrombin Time: 12.6 seconds (ref 11.4–15.2)

## 2018-12-17 LAB — RAPID URINE DRUG SCREEN, HOSP PERFORMED
Amphetamines: NOT DETECTED
Barbiturates: NOT DETECTED
Benzodiazepines: NOT DETECTED
Cocaine: NOT DETECTED
Opiates: NOT DETECTED
Tetrahydrocannabinol: NOT DETECTED

## 2018-12-17 LAB — ETHANOL: Alcohol, Ethyl (B): <10 mg/dL (ref <10)

## 2018-12-17 LAB — APTT: aPTT: 28 seconds (ref 24–36)

## 2018-12-17 LAB — I-STAT TROPONIN, ED: Troponin i, poc: 0.03 ng/mL (ref 0.00–0.08)

## 2018-12-17 MED ORDER — SODIUM CHLORIDE 0.9 % IV SOLN
INTRAVENOUS | Status: DC
  Administered 2018-12-18 (×2): via INTRAVENOUS

## 2018-12-17 MED ORDER — SENNOSIDES-DOCUSATE SODIUM 8.6-50 MG PO TABS: 1.0000 | ORAL_TABLET | Freq: Every evening | ORAL | Status: DC | PRN

## 2018-12-17 MED ORDER — ACETAMINOPHEN 160 MG/5ML PO SOLN: 650.0000 mg | ORAL | Status: DC | PRN

## 2018-12-17 MED ORDER — ASPIRIN 325 MG PO TABS
325.0000 mg | ORAL_TABLET | Freq: Every day | ORAL | Status: DC
  Administered 2018-12-18: 325 mg via ORAL
  Filled 2018-12-17: qty 1

## 2018-12-17 MED ORDER — ACETAMINOPHEN 650 MG RE SUPP: 650.0000 mg | RECTAL | Status: DC | PRN

## 2018-12-17 MED ORDER — ASPIRIN 300 MG RE SUPP: 300.0000 mg | Freq: Every day | RECTAL | Status: DC

## 2018-12-17 MED ORDER — ACETAMINOPHEN 325 MG PO TABS: 650.0000 mg | ORAL_TABLET | ORAL | Status: DC | PRN

## 2018-12-17 MED ORDER — ATORVASTATIN CALCIUM 40 MG PO TABS
40.0000 mg | ORAL_TABLET | Freq: Every day | ORAL | Status: DC
  Administered 2018-12-18: 40 mg via ORAL
  Filled 2018-12-17: qty 1

## 2018-12-17 MED ORDER — ENOXAPARIN SODIUM 40 MG/0.4ML ~~LOC~~ SOLN
40.0000 mg | Freq: Every day | SUBCUTANEOUS | Status: DC
  Administered 2018-12-18 – 2018-12-24 (×7): 40 mg via SUBCUTANEOUS
  Filled 2018-12-17 (×7): qty 0.4

## 2018-12-17 MED ORDER — LOSARTAN POTASSIUM 50 MG PO TABS
100.0000 mg | ORAL_TABLET | Freq: Every day | ORAL | Status: DC
  Administered 2018-12-18 – 2018-12-23 (×6): 100 mg via ORAL
  Filled 2018-12-17 (×8): qty 2

## 2018-12-17 MED ORDER — STROKE: EARLY STAGES OF RECOVERY BOOK
Freq: Once | Status: AC
  Administered 2018-12-18: 09:00:00
  Filled 2018-12-17: qty 1

## 2018-12-17 NOTE — ED Triage Notes (Signed)
BIB EMS from home, LKW Friday evening. Per family, pt woke up yesterday AM (Saturday) altered, woke up this afternoon from nap and has been non verbal ever since. Hypertensive. CBG 102.

## 2018-12-17 NOTE — ED Provider Notes (Signed)
[facility] EMERGENCY DEPARTMENT Provider Note   CSN: [id number] Arrival date & time: 12/17/18  2012     History   Chief Complaint Chief Complaint  Patient presents with  . Altered Mental Status    HPI Ronald Wolf is a 73 y.o. male.  HPI Patient presents to the emergency room for evaluation of altered mental status.  According to the EMS report the patient started having a change in his mental status yesterday morning.  The patient was less alert and seemed confused.  Today his symptoms are worse and he is not speaking.  He still is following some commands but is not at his baseline.  Patient has history of stroke as well as prior urinary tract infections.  Patient himself is unable to answer any questions for me.  He is sitting up in bed and looking at me and does follow some commands intermittently. Past Medical History:  Diagnosis Date  . Abdominal pain, other specified site 09/26/2013  . Anxiety   . Arthritis   . Atherosclerosis of arteries 05/13/2013  . Cough 09/26/2013  . Depression   . Eating disorder   . GERD (gastroesophageal reflux disease)   . Hepatitis C   . Hypertension   . Lesion of liver 04/01/2013  . Loss of weight 06/21/2014  . Medial knee pain 09/26/2013  . Palpitations 06/21/2014  . Shortness of breath dyspnea    with exertion    Patient Active Problem List   Diagnosis Date Noted  . 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 Alcohol use, unspecified with unspecified alcohol-induced disorder ([place]) 12/25/2017  . Stroke ([place]) 12/20/2017  . Decreased visual acuity 06/18/2015  . History of bladder cancer 04/24/2015  . History of UTI 03/27/2015  . Fatigue 10/23/2014  . Hyperglycemia 10/23/2014  . Wellness examination 10/23/2014  . PVD (peripheral vascular disease) ([place]) 07/28/2014  . Anxiety and depression 07/28/2014  . Palpitations 06/21/2014  . Cough 09/26/2013  . Atherosclerosis of arteries 05/13/2013  . Smoking 05/03/2013  . Lesion  of liver 04/01/2013  . Rhinitis 10/05/2012  . Chronic scapular pain 10/05/2012  . Anemia 09/17/2012  . Bone lesion 09/17/2012  . HTN (hypertension) 06/12/2012  . Trigeminal neuralgia 05/28/2012    Past Surgical History:  Procedure Laterality Date  . IR ANGIO INTRA EXTRACRAN SEL COM CAROTID INNOMINATE BILAT MOD SED  09/09/2017  . IR ANGIO VERTEBRAL SEL VERTEBRAL BILAT MOD SED  09/09/2017  . IR RADIOLOGIST EVAL & MGMT  09/07/2017  . MICROLARYNGOSCOPY WITH CO2 LASER AND EXCISION OF VOCAL CORD LESION N/A 05/14/2016   Procedure: MICROLARYNGOSCOPY WITH CO2 LASER AND EXCISION OF VOCAL CORD LESION;  Surgeon: Melida Quitter, MD;  Location: [facility];  Service: ENT;  Laterality: N/A;  Suspended micro laryngoscopy with CO2 laser excision of vocal fold lesion  . TUMOR REMOVAL  1990   bladder        Home Medications    Prior to Admission medications   Medication Sig Start Date End Date Taking? Authorizing Provider  aspirin EC 81 MG tablet Take 81 mg by mouth daily.   Yes [provider]  losartan (COZAAR) 100 MG tablet Take 100 mg by mouth daily.  08/14/17  Yes [provider]  tetrahydrozoline-zinc (VISINE-AC) 0.05-0.25 % ophthalmic solution Place 1 drop into both eyes daily as needed (dry eyes).    Yes [provider]  lidocaine (XYLOCAINE) 2 % solution Use as directed 20 mLs in the mouth or throat as needed for mouth  pain. Patient not taking: Reported on 12/17/2018 03/14/18   Mosie Lukes, MD  traMADol (ULTRAM) 50 MG tablet Take 1 tablet (50 mg total) by mouth 3 (three) times daily as needed. Patient not taking: Reported on 12/17/2018 12/20/17   Mosie Lukes, MD    Family History Family History  Problem Relation Age of Onset  . Diabetes Mother   . Arthritis Mother   . Healthy Sister        x 2  . Cancer Brother        stomach  . Dementia Father   . Prostate cancer Neg Hx   . Colon cancer Neg Hx   . Breast cancer Neg Hx   . Heart disease Neg Hx   . Hypertension Neg  Hx     Social History Social History   Tobacco Use  . Smoking status: Current Every Day Smoker    Packs/day: 1.00    Years: 35.00    Pack years: 35.00    Types: Cigarettes  . Smokeless tobacco: Never Used  Substance Use Topics  . Alcohol use: Yes    Alcohol/week: 0.0 standard drinks    Comment: 11 pint daily    none in a month and a half  . Drug use: No    Types: Marijuana     Allergies   Iohexol   Review of Systems Review of Systems  Unable to perform ROS: Mental status change     Physical Exam Updated Vital Signs BP (!) 205/96   Pulse 67   Temp 98.2 F (36.8 C) (Oral)   Resp (!) 23   SpO2 98%   Physical Exam Vitals signs and nursing note reviewed.  Constitutional:      General: He is not in acute distress.    Appearance: He is well-developed. He is not ill-appearing or toxic-appearing.  HENT:     Head: Normocephalic and atraumatic.     Right Ear: External ear normal.     Left Ear: External ear normal.  Eyes:     General: No scleral icterus.       Right eye: No discharge.        Left eye: No discharge.     Conjunctiva/sclera: Conjunctivae normal.  Neck:     Musculoskeletal: Neck supple.     Trachea: No tracheal deviation.  Cardiovascular:     Rate and Rhythm: Normal rate and regular rhythm.  Pulmonary:     Effort: Pulmonary effort is normal. No respiratory distress.     Breath sounds: Normal breath sounds. No stridor. No wheezing or rales.  Abdominal:     General: Bowel sounds are normal. There is no distension.     Palpations: Abdomen is soft.     Tenderness: There is no abdominal tenderness. There is no guarding or rebound.     Comments: Urostomy bag noted in the right lower abdomen  Musculoskeletal:        General: No tenderness.  Skin:    General: Skin is warm and dry.     Findings: No rash.  Neurological:     Mental Status: He is alert. He is disoriented.     GCS: GCS eye subscore is 4. GCS verbal subscore is 2. GCS motor subscore is 6.      Cranial Nerves: No cranial nerve deficit (no facial droop, extraocular movements intact, patient does not answer any questions).     Sensory: No sensory deficit.     Motor: No abnormal muscle tone  or seizure activity.     Comments: Patient moves all extremities however he does not consistently follow commands      ED Treatments / Results  Labs (all labs ordered are listed, but only abnormal results are displayed) Labs Reviewed  CBC - Abnormal; Notable for the following components:      Result Value   RBC 4.05 (*)    MCV 111.1 (*)    MCH 35.8 (*)    All other components within normal limits  DIFFERENTIAL - Abnormal; Notable for the following components:   Monocytes Absolute 1.1 (*)    All other components within normal limits  COMPREHENSIVE METABOLIC PANEL - Abnormal; Notable for the following components:   AST 46 (*)    ALT 46 (*)    All other components within normal limits  URINALYSIS, ROUTINE W REFLEX MICROSCOPIC - Abnormal; Notable for the following components:   Color, Urine AMBER (*)    APPearance CLOUDY (*)    pH 9.0 (*)    Protein, ur 100 (*)    Leukocytes, UA TRACE (*)    Bacteria, UA MANY (*)    Non Squamous Epithelial 0-5 (*)    All other components within normal limits  URINE CULTURE  ETHANOL  PROTIME-INR  APTT  RAPID URINE DRUG SCREEN, HOSP PERFORMED  I-STAT TROPONIN, ED    EKG EKG Interpretation  Date/Time:  Sunday December 17 2018 21:03:26 EST Ventricular Rate:  68 PR Interval:    QRS Duration: 132 QT Interval:  437 QTC Calculation: 465 R Axis:   -72 Text Interpretation:  sinus rhythm RBBB and LAFB Left ventricular hypertrophy No significant change since last tracing Confirmed by Dorie Rank [phone]) on 12/17/2018 9:06:48 PM   Radiology Ct Head Wo Contrast  Result Date: 12/17/2018 CLINICAL DATA:  Nonverbal since awakening from a nap this afternoon, history hypertension, smoker EXAM: CT HEAD WITHOUT CONTRAST TECHNIQUE: Contiguous axial images  were obtained from the base of the skull through the vertex without intravenous contrast. Sagittal and coronal MPR images reconstructed from axial data set. COMPARISON:  08/24/2017 FINDINGS: Brain: Generalized atrophy. Normal ventricular morphology. No midline shift or mass effect. Further evolution of LEFT posterior parietal watershed infarct seen previously. Small vessel chronic ischemic changes of deep cerebral white matter. No intracranial hemorrhage, mass lesion, or evidence of acute infarction. No extra-axial fluid collections. Vascular: Atherosclerotic calcifications of internal carotid arteries at skull base Skull: Intact Sinuses/Orbits: Opacified RIGHT frontal sinus with air-fluid level. Remaining paranasal sinuses and mastoid air cells clear. Other: N/A IMPRESSION: Atrophy with small vessel chronic ischemic changes of deep cerebral white matter. Further evolution of previously identified LEFT posterior parietal watershed infarct. No definite new intracranial abnormalities. Question RIGHT frontal sinusitis. Electronically Signed   By: Lavonia Dana M.D.   On: 12/17/2018 21:00    Procedures Procedures (including critical care time)  Medications Ordered in ED Medications - No data to display   Initial Impression / Assessment and Plan / ED Course  I have reviewed the triage vital signs and the nursing notes.  Pertinent labs & imaging results that were available during my care of the patient were reviewed by me and considered in my medical decision making (see chart for details).   Patient presents to the emergency room for evaluation of a change in his mental status.  Patient's laboratory tests are unremarkable.  Urinalysis does show many bacteria in the urine however he does have a urostomy.  I am not certain that this is acute infection  rather than colonization.  CT scan shows prior strokes but no acute stroke.  Patient does have an aphasia that is concerning for possible recurrent stroke.  He  is well outside any acute intervention window.  His symptoms started greater than 24 hours ago.  I will consult the medical service for admission and further evaluation.  He will likely need MRI and neurology consultation.    Final Clinical Impressions(s) / ED Diagnoses   Final diagnoses:  Altered mental status, unspecified altered mental status type  Aphasia      Dorie Rank, MD 12/17/18 2308

## 2018-12-17 NOTE — ED Notes (Signed)
Admitting at bedside 

## 2018-12-17 NOTE — ED Notes (Signed)
Patient transported to CT 

## 2018-12-17 NOTE — H&P (Signed)
History and Physical   Ronald Wolf CBS:[id number] DOB: [birth year] DOA: 12/17/2018  Referring MD/NP/PA: Dr Dorie Rank  PCP: Mosie Lukes, MD   Outpatient Specialists: None   Patient coming from: Home  Chief Complaint: Aphasia  HPI: Ronald Wolf is a 73 y.o. male with medical history significant of previous CVA, hypertension, recurrent UTIs, previous alcohol abuse, GERD, history of bladder cancer and medication noncompliance who was brought in due to progressive altered mental status since yesterday morning.  Patient has not been himself since yesterday morning.  Initially family thought may be UTI.  Today however he has not been able to verbalize much.  He can answer 1 or 2 words only this evening.  No recent accidents.  He has had prior CVA back in 2014 apparently.  Not taking any medications.  He has longstanding hypertension with systolic in the 846K but patient has not been taking his blood pressure medicine on a regular basis.  Initial CT head showed no evidence of CVA.  Urinalysis WBCs but suspected to be secondary to chronic catheter.  ED Course: Temperature 98.2 with blood pressure 213/90 pulse 71 respiratory 23 oxygen sat 98% room air.  CBC essentially normal chemistry mostly normal except for mildly elevated AST and ALT at 46.  Initial troponin 0 0.03.  Head CT without contrast showed no acute findings.  Urine drug screen is negative.  Urinalysis showed cloudy urine which is amber with trace leukocytes many bacteria WBC 21-50 and triple phosphate area.  Patient is therefore being admitted for possible CVA or UTI.  Review of Systems: As per HPI otherwise 10 point review of systems negative.    Past Medical History:  Diagnosis Date  . Abdominal pain, other specified site 09/26/2013  . Anxiety   . Arthritis   . Atherosclerosis of arteries 05/13/2013  . Cough 09/26/2013  . Depression   . Eating disorder   . GERD (gastroesophageal reflux disease)   . Hepatitis C   . Hypertension     . Lesion of liver 04/01/2013  . Loss of weight 06/21/2014  . Medial knee pain 09/26/2013  . Palpitations 06/21/2014  . Shortness of breath dyspnea    with exertion    Past Surgical History:  Procedure Laterality Date  . IR ANGIO INTRA EXTRACRAN SEL COM CAROTID INNOMINATE BILAT MOD SED  09/09/2017  . IR ANGIO VERTEBRAL SEL VERTEBRAL BILAT MOD SED  09/09/2017  . IR RADIOLOGIST EVAL & MGMT  09/07/2017  . MICROLARYNGOSCOPY WITH CO2 LASER AND EXCISION OF VOCAL CORD LESION N/A 05/14/2016   Procedure: MICROLARYNGOSCOPY WITH CO2 LASER AND EXCISION OF VOCAL CORD LESION;  Surgeon: Melida Quitter, MD;  Location: [facility];  Service: ENT;  Laterality: N/A;  Suspended micro laryngoscopy with CO2 laser excision of vocal fold lesion  . TUMOR REMOVAL  1990   bladder     reports that he has been smoking cigarettes. He has a 35.00 pack-year smoking history. He has never used smokeless tobacco. He reports current alcohol use. He reports that he does not use drugs.  Allergies  Allergen Reactions  . Iohexol Hives     Desc: pt broke out in hives years ago from iv contrast. he was given 50mg  benadryl po, 1 hr prior to ct and did fine today w/o complications.  JB     Family History  Problem Relation Age of Onset  . Diabetes Mother   . Arthritis Mother   . Healthy Sister  x 2  . Cancer Brother        stomach  . Dementia Father   . Prostate cancer Neg Hx   . Colon cancer Neg Hx   . Breast cancer Neg Hx   . Heart disease Neg Hx   . Hypertension Neg Hx      Prior to Admission medications   Medication Sig Start Date End Date Taking? Authorizing Provider  aspirin EC 81 MG tablet Take 81 mg by mouth daily.   Yes [provider]  losartan (COZAAR) 100 MG tablet Take 100 mg by mouth daily.  08/14/17  Yes [provider]  tetrahydrozoline-zinc (VISINE-AC) 0.05-0.25 % ophthalmic solution Place 1 drop into both eyes daily as needed (dry eyes).    Yes [provider]  lidocaine  (XYLOCAINE) 2 % solution Use as directed 20 mLs in the mouth or throat as needed for mouth pain. Patient not taking: Reported on 12/17/2018 03/14/18   Mosie Lukes, MD  traMADol (ULTRAM) 50 MG tablet Take 1 tablet (50 mg total) by mouth 3 (three) times daily as needed. Patient not taking: Reported on 12/17/2018 12/20/17   Mosie Lukes, MD    Physical Exam: Vitals:   12/17/18 2130 12/17/18 2145 12/17/18 2300 12/17/18 2345  BP: (!) 197/86 (!) 200/86 (!) 205/96 (!) 194/90  Pulse: 71 69 67 66  Resp: 17 15 (!) 23 18  Temp:      TempSrc:      SpO2: 99% 98% 98% 98%      Constitutional: NAD, calm, comfortable Vitals:   12/17/18 2130 12/17/18 2145 12/17/18 2300 12/17/18 2345  BP: (!) 197/86 (!) 200/86 (!) 205/96 (!) 194/90  Pulse: 71 69 67 66  Resp: 17 15 (!) 23 18  Temp:      TempSrc:      SpO2: 99% 98% 98% 98%   Eyes: PERRL, lids and conjunctivae normal ENMT: Mucous membranes are moist. Posterior pharynx clear of any exudate or lesions.Normal dentition.  Neck: normal, supple, no masses, no thyromegaly Respiratory: clear to auscultation bilaterally, no wheezing, no crackles. Normal respiratory effort. No accessory muscle use.  Cardiovascular: Regular rate and rhythm, no murmurs / rubs / gallops. No extremity edema. 2+ pedal pulses. No carotid bruits.  Abdomen: no tenderness, no masses palpated. No hepatosplenomegaly. Bowel sounds positive.  Musculoskeletal: no clubbing / cyanosis. No joint deformity upper and lower extremities. Good ROM, no contractures. Normal muscle tone.  Skin: no rashes, lesions, ulcers. No induration Neurologic: CN 2-12 grossly intact. Sensation intact, DTR normal. Strength 5/5 in all 4.  Psychiatric: Normal judgment and insight. Alert and oriented x 3. Normal mood.     Labs on Admission: I have personally reviewed following labs and imaging studies  CBC: Recent Labs  Lab 12/17/18 2032  WBC 8.5  NEUTROABS 4.6  HGB 14.5  HCT 45.0  MCV 111.1*  PLT 992    Basic Metabolic Panel: Recent Labs  Lab 12/17/18 2032  NA 141  K 3.9  CL 106  CO2 24  GLUCOSE 98  BUN 18  CREATININE 0.98  CALCIUM 9.8   GFR: CrCl cannot be calculated (Unknown ideal weight.). Liver Function Tests: Recent Labs  Lab 12/17/18 2032  AST 46*  ALT 46*  ALKPHOS 75  BILITOT 0.9  PROT 7.8  ALBUMIN 3.6   No results for input(s): LIPASE, AMYLASE in the last 168 hours. No results for input(s): AMMONIA in the last 168 hours. Coagulation Profile: Recent Labs  Lab 12/17/18 2032  INR 0.95   Cardiac Enzymes: No results for input(s): CKTOTAL, CKMB, CKMBINDEX, TROPONINI in the last 168 hours. BNP (last 3 results) No results for input(s): PROBNP in the last 8760 hours. HbA1C: No results for input(s): HGBA1C in the last 72 hours. CBG: No results for input(s): GLUCAP in the last 168 hours. Lipid Profile: No results for input(s): CHOL, HDL, LDLCALC, TRIG, CHOLHDL, LDLDIRECT in the last 72 hours. Thyroid Function Tests: No results for input(s): TSH, T4TOTAL, FREET4, T3FREE, THYROIDAB in the last 72 hours. Anemia Panel: No results for input(s): VITAMINB12, FOLATE, FERRITIN, TIBC, IRON, RETICCTPCT in the last 72 hours. Urine analysis:    Component Value Date/Time   COLORURINE AMBER (A) 12/17/2018 2159   APPEARANCEUR CLOUDY (A) 12/17/2018 2159   LABSPEC 1.012 12/17/2018 2159   PHURINE 9.0 (H) 12/17/2018 2159   GLUCOSEU NEGATIVE 12/17/2018 2159   GLUCOSEU NEGATIVE 08/21/2015 1412   HGBUR NEGATIVE 12/17/2018 2159   BILIRUBINUR NEGATIVE 12/17/2018 2159   BILIRUBINUR negative 12/20/2017 1846   KETONESUR NEGATIVE 12/17/2018 2159   PROTEINUR 100 (A) 12/17/2018 2159   UROBILINOGEN 0.2 12/20/2017 1846   UROBILINOGEN 0.2 08/21/2015 1412   NITRITE NEGATIVE 12/17/2018 2159   LEUKOCYTESUR TRACE (A) 12/17/2018 2159   Sepsis Labs: @LABRCNTIP (procalcitonin:4,lacticidven:4) )No results found for this or any previous visit (from the past 240 hour(s)).   Radiological  Exams on Admission: Ct Head Wo Contrast  Result Date: 12/17/2018 CLINICAL DATA:  Nonverbal since awakening from a nap this afternoon, history hypertension, smoker EXAM: CT HEAD WITHOUT CONTRAST TECHNIQUE: Contiguous axial images were obtained from the base of the skull through the vertex without intravenous contrast. Sagittal and coronal MPR images reconstructed from axial data set. COMPARISON:  08/24/2017 FINDINGS: Brain: Generalized atrophy. Normal ventricular morphology. No midline shift or mass effect. Further evolution of LEFT posterior parietal watershed infarct seen previously. Small vessel chronic ischemic changes of deep cerebral white matter. No intracranial hemorrhage, mass lesion, or evidence of acute infarction. No extra-axial fluid collections. Vascular: Atherosclerotic calcifications of internal carotid arteries at skull base Skull: Intact Sinuses/Orbits: Opacified RIGHT frontal sinus with air-fluid level. Remaining paranasal sinuses and mastoid air cells clear. Other: N/A IMPRESSION: Atrophy with small vessel chronic ischemic changes of deep cerebral white matter. Further evolution of previously identified LEFT posterior parietal watershed infarct. No definite new intracranial abnormalities. Question RIGHT frontal sinusitis. Electronically Signed   By: Lavonia Dana M.D.   On: 12/17/2018 21:00    EKG: Independently reviewed.  It showed sinus rhythm with a rate of 68, right bundle branch block, left anterior fascicular block, no significant ST changes.  EKG unchanged from previous.  Assessment/Plan Principal Problem:   CVA (cerebral vascular accident) ([place]) Active Problems:   HTN (hypertension)   Anemia   Smoking   UTI (urinary tract infection)   History of bladder cancer     #1 aphasia and altered mental status: Suspected acute CVA.  Negative head CT.  Will get MRI of the brain with carotid Dopplers as well as echocardiogram.  If MRI is positive neurology will be consulted on the  stroke team will follow with the patient.  Patient arrived outside the window for TPA symptoms have been going on more than 24 hours.  Continue with aspirin and statin for now.  Speech therapy occupational therapy and physical therapy consultation.  #2 uncontrolled hypertension: Patient has not been taking his medications on a regular basis.  If CVA is confirmed we will do permissive hypertension otherwise we will aggressively treat.  Put  patient back on his losartan and additional medications as needed.    #3 anemia of chronic disease: follow H&H and treat accordingly.  #4 history of bladder cancer: Defer to his outpatient oncologist for any continued care.  #5 UTI: Urine culture and sensitivities ordered.  We will treat patient with empiric Rocephin.   DVT prophylaxis: Lovenox Code Status: Full code Family Communication: Daughter, granddaughter and girlfriend at bedside Disposition Plan: To be determined Consults called: None at this point Admission status: Inpatient  Severity of Illness: The appropriate patient status for this patient is INPATIENT. Inpatient status is judged to be reasonable and necessary in order to provide the required intensity of service to ensure the patient's safety. The patient's presenting symptoms, physical exam findings, and initial radiographic and laboratory data in the context of their chronic comorbidities is felt to place them at high risk for further clinical deterioration. Furthermore, it is not anticipated that the patient will be medically stable for discharge from the hospital within 2 midnights of admission. The following factors support the patient status of inpatient.   " The patient's presenting symptoms include aphasia and altered mental status. " The worrisome physical exam findings include aphasia with confusion. " The initial radiographic and laboratory data are worrisome because of normal head CT. " The chronic co-morbidities include  hypertension on medication noncompliance.   * I certify that at the point of admission it is my clinical judgment that the patient will require inpatient hospital care spanning beyond 2 midnights from the point of admission due to high intensity of service, high risk for further deterioration and high frequency of surveillance required.Barbette Merino MD [facility] Pager [phone]  If 7PM-7AM, please contact night-coverage [email or web address] Password [email or web address]  12/18/2018, 12:00 AM

## 2018-12-18 ENCOUNTER — Inpatient Hospital Stay (HOSPITAL_COMMUNITY): Payer: Medicare PPO

## 2018-12-18 DIAGNOSIS — F17200 Nicotine dependence, unspecified, uncomplicated: Secondary | ICD-10-CM

## 2018-12-18 DIAGNOSIS — I639 Cerebral infarction, unspecified: Secondary | ICD-10-CM

## 2018-12-18 DIAGNOSIS — D649 Anemia, unspecified: Secondary | ICD-10-CM

## 2018-12-18 DIAGNOSIS — I6389 Other cerebral infarction: Secondary | ICD-10-CM

## 2018-12-18 LAB — COMPREHENSIVE METABOLIC PANEL
ALT: 43 U/L (ref 0–44)
AST: 40 U/L (ref 15–41)
Albumin: 3.4 g/dL — ABNORMAL LOW (ref 3.5–5.0)
Alkaline Phosphatase: 77 U/L (ref 38–126)
Anion gap: 9 (ref 5–15)
BUN: 15 mg/dL (ref 8–23)
CO2: 24 mmol/L (ref 22–32)
Calcium: 9.3 mg/dL (ref 8.9–10.3)
Chloride: 106 mmol/L (ref 98–111)
Creatinine, Ser: 0.81 mg/dL (ref 0.61–1.24)
GFR calc Af Amer: >60 mL/min (ref >60)
GFR calc non Af Amer: >60 mL/min (ref >60)
Glucose, Bld: 101 mg/dL — ABNORMAL HIGH (ref 70–99)
Potassium: 3.7 mmol/L (ref 3.5–5.1)
Sodium: 139 mmol/L (ref 135–145)
Total Bilirubin: 1.2 mg/dL (ref 0.3–1.2)
Total Protein: 7.4 g/dL (ref 6.5–8.1)

## 2018-12-18 LAB — CBC
HCT: 43 % (ref 39.0–52.0)
HCT: 44.6 % (ref 39.0–52.0)
Hemoglobin: 13.7 g/dL (ref 13.0–17.0)
Hemoglobin: 13.8 g/dL (ref 13.0–17.0)
MCH: 34.9 pg — ABNORMAL HIGH (ref 26.0–34.0)
MCH: 34.2 pg — ABNORMAL HIGH (ref 26.0–34.0)
MCHC: 31.9 g/dL (ref 30.0–36.0)
MCHC: 30.9 g/dL (ref 30.0–36.0)
MCV: 109.4 fL — ABNORMAL HIGH (ref 80.0–100.0)
MCV: 110.4 fL — ABNORMAL HIGH (ref 80.0–100.0)
Platelets: 239 10*3/uL (ref 150–400)
Platelets: 244 10*3/uL (ref 150–400)
RBC: 3.93 MIL/uL — ABNORMAL LOW (ref 4.22–5.81)
RBC: 4.04 MIL/uL — ABNORMAL LOW (ref 4.22–5.81)
RDW: 12.7 % (ref 11.5–15.5)
RDW: 12.6 % (ref 11.5–15.5)
WBC: 8.1 10*3/uL (ref 4.0–10.5)
WBC: 7.8 10*3/uL (ref 4.0–10.5)
nRBC: 0 % (ref 0.0–0.2)
nRBC: 0 % (ref 0.0–0.2)

## 2018-12-18 LAB — LIPID PANEL
Cholesterol: 178 mg/dL (ref 0–200)
HDL: 41 mg/dL (ref >40)
LDL Cholesterol: 113 mg/dL — ABNORMAL HIGH (ref 0–99)
Total CHOL/HDL Ratio: 4.3 RATIO
Triglycerides: 119 mg/dL (ref <150)
VLDL: 24 mg/dL (ref 0–40)

## 2018-12-18 LAB — HEMOGLOBIN A1C
Hgb A1c MFr Bld: 4.2 % — ABNORMAL LOW (ref 4.8–5.6)
Mean Plasma Glucose: 73.84 mg/dL

## 2018-12-18 LAB — CREATININE, SERUM
Creatinine, Ser: 0.98 mg/dL (ref 0.61–1.24)
GFR calc Af Amer: >60 mL/min (ref >60)
GFR calc non Af Amer: >60 mL/min (ref >60)

## 2018-12-18 LAB — ECHOCARDIOGRAM COMPLETE
Height: 71 in
Weight: 2447.99 oz

## 2018-12-18 LAB — TROPONIN I: Troponin I: <0.03 ng/mL (ref <0.03)

## 2018-12-18 MED ORDER — CLOPIDOGREL BISULFATE 75 MG PO TABS
300.0000 mg | ORAL_TABLET | Freq: Once | ORAL | Status: AC
  Administered 2018-12-18: 300 mg via ORAL
  Filled 2018-12-18: qty 4

## 2018-12-18 MED ORDER — CLOPIDOGREL BISULFATE 75 MG PO TABS: 75.0000 mg | ORAL_TABLET | Freq: Every day | ORAL | Status: DC

## 2018-12-18 MED ORDER — ASPIRIN EC 81 MG PO TBEC
81.0000 mg | DELAYED_RELEASE_TABLET | Freq: Every day | ORAL | Status: DC
  Administered 2018-12-18 – 2018-12-24 (×7): 81 mg via ORAL
  Filled 2018-12-18 (×8): qty 1

## 2018-12-18 MED ORDER — CLOPIDOGREL BISULFATE 75 MG PO TABS
75.0000 mg | ORAL_TABLET | Freq: Every day | ORAL | Status: DC
  Administered 2018-12-19 – 2018-12-24 (×6): 75 mg via ORAL
  Filled 2018-12-18 (×7): qty 1

## 2018-12-18 MED ORDER — PERFLUTREN LIPID MICROSPHERE
1.0000 mL | INTRAVENOUS | Status: AC | PRN
  Administered 2018-12-18: 2 mL via INTRAVENOUS
  Filled 2018-12-18: qty 10

## 2018-12-18 NOTE — Evaluation (Signed)
Speech Language Pathology Evaluation Patient Details Name: Ronald Wolf MRN: [id number] DOB: 1946/10/04 Today's Date: 12/18/2018 Time: 1025-1100 SLP Time Calculation (min) (ACUTE ONLY): 35 min  Problem List:  Patient Active Problem List   Diagnosis Date Noted  . CVA (cerebral vascular accident) ([place]) 12/17/2018  . 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 Alcohol use, unspecified with unspecified alcohol-induced disorder ([place]) 12/25/2017  . Stroke ([place]) 12/20/2017  . Decreased visual acuity 06/18/2015  . History of bladder cancer 04/24/2015  . History of UTI 03/27/2015  . UTI (urinary tract infection) 11/21/2014  . Fatigue 10/23/2014  . Hyperglycemia 10/23/2014  . Wellness examination 10/23/2014  . PVD (peripheral vascular disease) ([place]) 07/28/2014  . Anxiety and depression 07/28/2014  . Palpitations 06/21/2014  . Cough 09/26/2013  . Atherosclerosis of arteries 05/13/2013  . Smoking 05/03/2013  . Lesion of liver 04/01/2013  . Rhinitis 10/05/2012  . Chronic scapular pain 10/05/2012  . Anemia 09/17/2012  . Bone lesion 09/17/2012  . HTN (hypertension) 06/12/2012  . Trigeminal neuralgia 05/28/2012   Past Medical History:  Past Medical History:  Diagnosis Date  . Abdominal pain, other specified site 09/26/2013  . Anxiety   . Arthritis   . Atherosclerosis of arteries 05/13/2013  . Cough 09/26/2013  . Depression   . Eating disorder   . GERD (gastroesophageal reflux disease)   . Hepatitis C   . Hypertension   . Lesion of liver 04/01/2013  . Loss of weight 06/21/2014  . Medial knee pain 09/26/2013  . Palpitations 06/21/2014  . Shortness of breath dyspnea    with exertion   Past Surgical History:  Past Surgical History:  Procedure Laterality Date  . IR ANGIO INTRA EXTRACRAN SEL COM CAROTID INNOMINATE BILAT MOD SED  09/09/2017  . IR ANGIO VERTEBRAL SEL VERTEBRAL BILAT MOD SED  09/09/2017  . IR RADIOLOGIST EVAL & MGMT  09/07/2017  . MICROLARYNGOSCOPY WITH CO2 LASER AND EXCISION OF  VOCAL CORD LESION N/A 05/14/2016   Procedure: MICROLARYNGOSCOPY WITH CO2 LASER AND EXCISION OF VOCAL CORD LESION;  Surgeon: Melida Quitter, MD;  Location: [facility];  Service: ENT;  Laterality: N/A;  Suspended micro laryngoscopy with CO2 laser excision of vocal fold lesion  . TUMOR REMOVAL  1990   bladder   HPI:  73 y.o. male with medical history significant for previous CVA, hypertension, recurrent UTIs, previous alcohol abuse, GERD, history of bladder cancer and medication noncompliance who was brought in due to progressive altered mental status.  MRI 12/18/18 reveals patchy multifocal acute to early subacute left MCA territory infarcts involving the left frontal lobe and basal ganglia. Associated faint petechial hemorrhage without frank hemorrhagictransformation. No significant mass effect. 2. Chronic left parieto-occipital infarct 3. Underlying moderately advanced cerebral atrophy with chronic   Assessment / Plan / Recommendation Clinical Impression  Pt with acute left frontal CVA and hx ETOH presents with symptoms of aphasia; however, poor participation during assessment prevented thorough diagnosis of degree and type of aphasia.  Demonstrated decreased verbal initiation and delayed response time.  Pt followed simple commands during predictable context, but initiated no effort to follow less predictable commands.  He did not repeat when asked.  He stated his name and DOB when queried, but did not name familiar family members nor common items in room.  He occasionally spoke spontaneously in fluent phrases with no dysarthria noted.  Pt's brother, Ronald Wolf, was present and we discussed plan over next few days, including testing and acute rehab therapy evaluations.  Ronald Wolf reiterated his brother's  dependence on alcohol, confirmed by pt's girlfriend, Ronald Wolf, over phone that pt was drinking a pint of liquor daily until "a couple weeks ago."  SLP will follow for ongoing assessment as participation will allow.  At this time  he would require 24 hour supervision.     SLP Assessment  SLP Recommendation/Assessment: Patient needs continued Speech Lanaguage Pathology Services    Follow Up Recommendations  24 hour supervision/assistance;Other (comment)(tba)    Frequency and Duration min 1 x/week  2 weeks      SLP Evaluation Cognition  Overall Cognitive Status: Impaired/Different from baseline Arousal/Alertness: Awake/alert Attention: Sustained Sustained Attention: Impaired Sustained Attention Impairment: Verbal basic       Comprehension  Auditory Comprehension Overall Auditory Comprehension: Impaired Yes/No Questions: Impaired Basic Immediate Environment Questions: 25-49% accurate Commands: Impaired One Step Basic Commands: 0-24% accurate Conversation: Simple Visual Recognition/Discrimination Discrimination: Exceptions to [facility] Pictures: Unable to indentify    Expression Expression Primary Mode of Expression: Verbal Verbal Expression Overall Verbal Expression: Impaired Initiation: Impaired Automatic Speech: Name Level of Generative/Spontaneous Verbalization: Phrase Repetition: (no response) Naming: Impairment Confrontation: Impaired Pictures: Unable to indentify Convergent: 25-49% accurate   Oral / Motor  Oral Motor/Sensory Function Overall Oral Motor/Sensory Function: Other (comment)(no apparent asymmetry) Motor Speech Overall Motor Speech: Appears within functional limits for tasks assessed   GO                    Juan Quam Laurice 12/18/2018, 11:51 AM  Estill Bamberg L. Tivis Ringer, [place] Office number [phone] Pager [phone]

## 2018-12-18 NOTE — Consult Note (Addendum)
Neurology Consultation  Reason for Consult: Stroke Referring Physician:  Dr. Bonner Puna  CC: Altered mental status and difficulty speaking  History is obtained from: Friends who are in room  HPI: Ronald Wolf is a 73 y.o. male with past medical history of trigeminal neuralgia of the left V2 distribution ,previous CVA, hypertension, recurrent UTIs, alcohol abuse and medication noncompliance.  Due to altered mental status on Saturday patient was brought to the emergency department as family thought he might be having an UTI.  On Sunday his symptoms became worse and was only able to answer 1 or 2 words.  Patient was admitted to the hospital for a aphasia and altered mental status along with uncontrolled hypertension and UTI.  On admission patient was placed on Rocephin.  Patient was transferred to do MRI this morning which noted patient had a left MCA distribution infarct.  Neurology was consulted for evaluation.   ED course : Patient was transferred to [facility] by EMS secondary to altered mental status last Saturday.  By the time he came to the hospital family noted that his symptoms are worse and he was not speaking.  While in the ED he was still following commands but not his baseline.  Head CT was obtained which showed atrophy with small vessel chronic ischemic changes of deep cerebral white matter.  There is also findings of a left posterior parietal watershed infarct.  Due to patient being out of window for TPA and had a window for intervention patient was admitted to the hospital.  It was noted that patient's blood pressure was 213/90 with a pulse of 71 upon arriving at the emergency department.   Chart review: Patient has been seen by neurology for trigeminal neuralgia.  At that time he was seeing Dr. Jannifer Franklin of [facility].  Since then patient has had gamma knife surgery for that trigeminal neuralgia at [facility].  Per friend who lives with the patient.  Patient has  neurocognitive decline- she cooks, does the bills, helps bathe him, cannot remember the date but has no problem with family members.  Patient, per live-in friend, does not take aspirin on a daily basis nor does he take his antihypertensives on a daily basis.  He does smoke half pack a day.   LKW: 12/15/2018 tpa given?: no, out of window Premorbid modified Rankin scale (mRS): 1 Stroke scale 3  ROS: Unable to obtain due to altered mental status.   Past Medical History:  Diagnosis Date  . Abdominal pain, other specified site 09/26/2013  . Anxiety   . Arthritis   . Atherosclerosis of arteries 05/13/2013  . Cough 09/26/2013  . Depression   . Eating disorder   . GERD (gastroesophageal reflux disease)   . Hepatitis C   . Hypertension   . Lesion of liver 04/01/2013  . Loss of weight 06/21/2014  . Medial knee pain 09/26/2013  . Palpitations 06/21/2014  . Shortness of breath dyspnea    with exertion    Family History  Problem Relation Age of Onset  . Diabetes Mother   . Arthritis Mother   . Healthy Sister        x 2  . Cancer Brother        stomach  . Dementia Father   . Prostate cancer Neg Hx   . Colon cancer Neg Hx   . Breast cancer Neg Hx   . Heart disease Neg Hx   . Hypertension Neg Hx  Social History:   reports that he has been smoking cigarettes. He has a 35.00 pack-year smoking history. He has never used smokeless tobacco. He reports current alcohol use. He reports that he does not use drugs.  Medications  Current Facility-Administered Medications:  .  0.9 %  sodium chloride infusion, , Intravenous, Continuous, Garba, Mohammad L, MD, Last Rate: 100 mL/hr at 12/18/18 0032 .  acetaminophen (TYLENOL) tablet 650 mg, 650 mg, Oral, Q4H PRN **OR** acetaminophen (TYLENOL) solution 650 mg, 650 mg, Per Tube, Q4H PRN **OR** acetaminophen (TYLENOL) suppository 650 mg, 650 mg, Rectal, Q4H PRN, Jonelle Sidle, Mohammad L, MD .  aspirin suppository 300 mg, 300 mg, Rectal, Daily **OR**  aspirin tablet 325 mg, 325 mg, Oral, Daily, Jonelle Sidle, Mohammad L, MD, 325 mg at 12/18/18 0915 .  atorvastatin (LIPITOR) tablet 40 mg, 40 mg, Oral, q1800, Garba, Mohammad L, MD .  enoxaparin (LOVENOX) injection 40 mg, 40 mg, Subcutaneous, Daily, Garba, Mohammad L, MD .  losartan (COZAAR) tablet 100 mg, 100 mg, Oral, Daily, Jonelle Sidle, Mohammad L, MD, 100 mg at 12/18/18 0915 .  senna-docusate (Senokot-S) tablet 1 tablet, 1 tablet, Oral, QHS PRN, Elwyn Reach, MD   Exam: Current vital signs: BP (!) 145/81 (BP Location: Right Arm)   Pulse 61   Temp 98.3 F (36.8 C) (Oral)   Resp 18   Ht 5\' 11"  (1.803 m)   Wt 69.4 kg   SpO2 100%   BMI 21.34 kg/m  Vital signs in last 24 hours: Temp:  [98.2 F (36.8 C)-98.9 F (37.2 C)] 98.3 F (36.8 C) (01/06 1203) Pulse Rate:  [61-72] 61 (01/06 1203) Resp:  [15-24] 18 (01/06 1203) BP: (145-213)/(80-96) 145/81 (01/06 1203) SpO2:  [97 %-100 %] 100 % (01/06 1203) Weight:  [69.4 kg] 69.4 kg (01/06 0900)  Physical Exam  Constitutional: Appears well-developed and well-nourished.  Psych: Affect appropriate to situation Eyes: No scleral injection HENT: No OP obstrucion Head: Normocephalic.  Cardiovascular: Normal rate and regular rhythm.  Respiratory: Effort normal, non-labored breathing GI: Soft.  No distension. There is no tenderness.  Skin: WDI  Neuro: Mental Status: Patient knows that he is at the hospital, does not know the year, he does know that his [place], able to name pen and thumb, able to repeat but does have significant difficulty following verbal commands and is very bradykinetic when he does so.  He has also very bradyphrenic when answering questions.  As patient does not speak a lot I noted he has more of a receptive aphasia than expressive aphasia at the time of consultation Cranial Nerves: II: Right hemianopsia III,IV, VI: EOMI without ptosis or diploplia. Pupils are equal, round, and reactive to light.   V: Facial sensation is  symmetric to temperature- it should be noted that is difficult to get a clear sensation with patient as he oftentimes does not understand what I am asking him VII: Facial movement is symmetric.  VIII: hearing is intact to voice X: Uvula elevates symmetrically XI: Shoulder shrug is symmetric. XII: tongue is midline without atrophy or fasciculations.  Motor: Tone is normal. Bulk is normal. 5/5 strength in the upper extremities however the lower extremities when I was testing his strength patient did not understand I was try to get him to lift his legs but had great strength with hip extension and knee flexion along with plantarflexion and dorsiflexion. Sensory: Stated there is no difference from right to left and sensation Deep Tendon Reflexes: 1+ in the upper extremities and patella with  no Achilles Plantars: Downgoing bilaterally Cerebellar: No finger to finger dysmetria noted could not get patient to do heel-to-shin  Labs I have reviewed labs in epic and the results pertinent to this consultation are:   CBC    Component Value Date/Time   WBC 7.8 12/18/2018 0322   RBC 4.04 (L) 12/18/2018 0322   HGB 13.8 12/18/2018 0322   HGB 14.5 03/08/2016 1355   HCT 44.6 12/18/2018 0322   HCT 42.1 03/08/2016 1355   PLT 244 12/18/2018 0322   PLT 274 03/08/2016 1355   MCV 110.4 (H) 12/18/2018 0322   MCV 106 (H) 03/08/2016 1355   MCH 34.2 (H) 12/18/2018 0322   MCHC 30.9 12/18/2018 0322   RDW 12.6 12/18/2018 0322   RDW 13.3 03/08/2016 1355   LYMPHSABS 2.8 12/17/2018 2032   LYMPHSABS 2.4 03/08/2016 1355   MONOABS 1.1 (H) 12/17/2018 2032   EOSABS 0.1 12/17/2018 2032   EOSABS 0.1 03/08/2016 1355   BASOSABS 0.0 12/17/2018 2032   BASOSABS 0.0 03/08/2016 1355    CMP     Component Value Date/Time   NA 139 12/18/2018 0322   NA 145 03/08/2016 1356   K 3.7 12/18/2018 0322   K 4.5 03/08/2016 1356   CL 106 12/18/2018 0322   CO2 24 12/18/2018 0322   CO2 28 03/08/2016 1356   GLUCOSE 101 (H)  12/18/2018 0322   GLUCOSE 98 03/08/2016 1356   BUN 15 12/18/2018 0322   BUN 14.2 03/08/2016 1356   CREATININE 0.81 12/18/2018 0322   CREATININE 0.9 03/08/2016 1356   CALCIUM 9.3 12/18/2018 0322   CALCIUM 9.8 03/08/2016 1356   PROT 7.4 12/18/2018 0322   PROT 8.2 03/08/2016 1356   ALBUMIN 3.4 (L) 12/18/2018 0322   ALBUMIN 3.6 03/08/2016 1356   AST 40 12/18/2018 0322   AST 42 (H) 03/08/2016 1356   ALT 43 12/18/2018 0322   ALT 43 03/08/2016 1356   ALKPHOS 77 12/18/2018 0322   ALKPHOS 79 03/08/2016 1356   BILITOT 1.2 12/18/2018 0322   BILITOT 0.74 03/08/2016 1356   GFRNONAA >60 12/18/2018 0322   GFRAA >60 12/18/2018 0322    Lipid Panel     Component Value Date/Time   CHOL 178 12/18/2018 0322   TRIG 119 12/18/2018 0322   HDL 41 12/18/2018 0322   CHOLHDL 4.3 12/18/2018 0322   VLDL 24 12/18/2018 0322   LDLCALC 113 (H) 12/18/2018 0322     Imaging I have reviewed the images obtained:  CT-scan of the brain--Atrophy with small vessel chronic ischemic changes of deep cerebral white matter.   Further evolution of previously identified LEFT posterior parietal watershed infarct.  MRI /MRA examination of the brain-- --Patchy multifocal acute to early subacute left MCA territory infarcts involving the left frontal lobe and basal ganglia. Associated faint petechial hemorrhage without frank hemorrhagic transformation. No significant mass effect. Chronic left parieto-occipital infarct . Underlying moderately advanced cerebral atrophy with chronic small vessel ischemic disease. --Negative intracranial MRA for large vessel occlusion.  Extensive atheromatous disease throughout the intracranial circulation, with notable findings including moderate tandem cavernous/supraclinoid left ICA stenoses, moderate bilateral M1 stenoses, moderate to severe proximal left M2 branch stenoses, and severe tandem left V4 stenoses. Overall, appearance of the intracranial circulation likely relatively similar as  compared to previous arteriogram from 2018.  Etta Quill PA-C [facility] [phone]  M-F  (9:00 am- 5:00 PM)  12/18/2018, 12:42 PM     Assessment:  Left MCA stroke most likely secondary to intracranial atherosclerotic disease.  Recommend #Transthoracic Echo,   and carotid duplex # Start patient on ASA 81mg  and plavix 75,g daily (loaded with 300mg  Plavix) x 59months  #Start or continue Atorvastatin 80 mg/other high intensity statin # BP goal: permissive HTN upto 220/120 mmHg # HBAIC and Lipid profile # Telemetry monitoring # Frequent neuro checks # NPO until passes stroke swallow screen    NEUROHOSPITALIST ADDENDUM Performed a face to face diagnostic evaluation.   I have reviewed the contents of history and physical exam as documented by PA/ARNP/Resident and agree with above documentation.  I have discussed and formulated the above plan as documented. Edits to the note have been made as needed.  Ronald Wolf is a 73 year old male history of hypertension, prior stroke, alcohol abuse, bladder cancer admitted for altered mental status for about 1 day.  Initially it was thought that he had a UTI however patient speech continued to get worse.  An MRI brain was performed which showed watershed-like pattern  Of acute infarcts in the left MCA distribution.  Patient has moderate to severe severe left ICA stenosis as well as severe left M2 stenosis.  He also has multivessel intracranial atherosclerotic disease.  Carotid Dopplers are still pending and important to rule out  symptomatic carotid stenosis. I will start him on dual antiplatelet therapy for 3 months as well as high-dose statin.  He will also need aggressive control of risk factors, healthy diet and exercise  Stroke Team to follow.      Karena Addison  MD [facility] [phone]   If 7pm to 7am, please call on call as listed on AMION.

## 2018-12-18 NOTE — ED Notes (Addendum)
Pt returned from MRI °

## 2018-12-18 NOTE — Evaluation (Signed)
Occupational Therapy Evaluation Patient Details Name: Ronald Wolf MRN: [id number] DOB: Feb 24, 1946 Today's Date: 12/18/2018    History of Present Illness Ronald Wolf is a 73 y.o. male with medical history significant of previous CVA, hypertension, recurrent UTIs, previous alcohol abuse, GERD, history of bladder cancer and medication noncompliance , brought in with AMS and imaging now showing L MCA infarct   Clinical Impression   Pt admitted with L MCA infarct, now presenting with mild R hemiparesis, aphasia, and cognitive deficits that limits Ronald ability to complete ADLs with PLOF. Per discussion with family present, family is willing and able to provide 24/7 (S) and min A level care. Discussed benefits of brief SNF stay and this is OT's current recommendation. If family takes pt home- recommend BSC and TTB. Pt overall at min A level currently d/t cognitive deficits.     Follow Up Recommendations  SNF;Supervision/Assistance - 24 hour    Equipment Recommendations  None recommended by OT    Recommendations for Other Services PT consult;Speech consult     Precautions / Restrictions Precautions Precautions: Fall Restrictions Weight Bearing Restrictions: No      Mobility Bed Mobility Overal bed mobility: Needs Assistance Bed Mobility: Sit to Supine;Supine to Sit     Supine to sit: Min assist Sit to supine: Min assist      Transfers Overall transfer level: Needs assistance Equipment used: Rolling walker (2 wheeled) Transfers: Sit to/from [facility] Sit to Stand: Min guard Stand pivot transfers: Min assist       General transfer comment: cueing for Rw management and UE placement    Balance Overall balance assessment: Mild deficits observed, not formally tested                                         ADL either performed or assessed with clinical judgement   ADL Overall ADL's : Needs assistance/impaired Eating/Feeding: Set up    Grooming: Set up   Upper Body Bathing: Supervision/ safety;Cueing for sequencing   Lower Body Bathing: Minimal assistance;Sit to/from stand;Cueing for sequencing   Upper Body Dressing : Minimal assistance;Cueing for sequencing;Sitting   Lower Body Dressing: Minimal assistance;Cueing for safety;Cueing for sequencing;Sit to/from stand   Toilet Transfer: Minimal assistance;BSC;RW;Stand-pivot;Cueing for sequencing;Cueing for safety   Toileting- Clothing Manipulation and Hygiene: Minimal assistance;Sit to/from stand;Cueing for sequencing       Functional mobility during ADLs: Minimal assistance;Rolling walker;Cueing for sequencing;Cueing for safety       Vision Baseline Vision/History: Cataracts Patient Visual Report: Peripheral vision impairment(per family) Vision Assessment?: Yes Eye Alignment: Impaired (comment) Ocular Range of Motion: Restricted on the right Alignment/Gaze Preference: Gaze left Tracking/Visual Pursuits: Unable to hold eye position out of midline;Requires cues, head turns, or add eye shifts to track Saccades: Impaired - to be further tested in functional context Visual Fields: Right homonymous hemianopsia     Perception Perception Perception Tested?: Yes Perception Deficits: Inattention/neglect Inattention/Neglect: Does not attend to right visual field   Praxis Praxis Praxis tested?: Deficits Deficits: Ideomotor;Motor Impersistence;Organization;Ideation;Initiation    Pertinent Vitals/Pain Pain Assessment: No/denies pain     Hand Dominance Right   Extremity/Trunk Assessment Upper Extremity Assessment Upper Extremity Assessment: RUE deficits/detail;Generalized weakness RUE Deficits / Details: 4/5, decreased coordination RUE Coordination: decreased fine motor   Lower Extremity Assessment Lower Extremity Assessment: Defer to PT evaluation   Cervical / Trunk Assessment Cervical / Trunk Assessment: Kyphotic  Communication  Communication Communication: Receptive difficulties;Expressive difficulties   Cognition Arousal/Alertness: Awake/alert Behavior During Therapy: WFL for tasks assessed/performed Overall Cognitive Status: Impaired/Different from baseline Area of Impairment: Orientation;Attention;Memory;Following commands;Safety/judgement;Awareness;Problem solving                 Orientation Level: Time;Situation Current Attention Level: Sustained Memory: Decreased short-term memory Following Commands: Follows one step commands inconsistently Safety/Judgement: Decreased awareness of safety Awareness: Intellectual Problem Solving: Slow processing;Difficulty sequencing;Decreased initiation;Requires verbal cues;Requires tactile cues     General Comments  Pt's Wolf and Ronald Wolf present throughout Ronald Wolf expects to be discharged to:: Private residence Living Arrangements: Spouse/significant other Available Help at Discharge: Family;Available 24 hours/day Type of Home: House Home Access: Level entry     Home Layout: One level     Bathroom Shower/Tub: Teacher, early years/pre: Standard Bathroom Accessibility: No   Home Equipment: Cane - single point   Additional Comments: All information supplied by pt's Wolf and Ronald Wolf  Lives With: Significant other    Prior Functioning/Environment Level of Independence: Independent with assistive device(s)        Comments: Pt's girlfriend performed all home manegement tasks, overall independent with ADLs, occasonally used cane        OT Problem List: Decreased strength;Decreased range of motion;Decreased activity tolerance;Impaired balance (sitting and/or standing);Impaired vision/perception;Decreased coordination;Decreased cognition;Decreased safety awareness;Decreased knowledge of use of DME or AE;Decreased knowledge of precautions;Impaired UE functional use      OT Treatment/Interventions:  Self-care/ADL training;Therapeutic exercise;Neuromuscular education;Energy conservation;DME and/or AE instruction;Balance training;Patient/family education;Visual/perceptual remediation/compensation;Cognitive remediation/compensation;Therapeutic activities;Manual therapy    OT Goals(Current goals can be found in the care plan section) Acute Rehab OT Goals Patient Stated Goal: none stated OT Goal Formulation: With patient/family Time For Goal Achievement: 12/25/18 Potential to Achieve Goals: Fair  OT Frequency: Min 2X/week    AM-PAC OT "6 Clicks" Daily Activity     Outcome Measure Help from another person eating meals?: A Little Help from another person taking care of personal grooming?: A Little Help from another person toileting, which includes using toliet, bedpan, or urinal?: A Little Help from another person bathing (including washing, rinsing, drying)?: A Little Help from another person to put on and taking off regular upper body clothing?: A Little Help from another person to put on and taking off regular lower body clothing?: A Little 6 Click Score: 18   End of Session Equipment Utilized During Treatment: Rolling walker Nurse Communication: Mobility status  Activity Tolerance: Patient tolerated treatment well Patient left: in bed  OT Visit Diagnosis: Unsteadiness on feet (R26.81);Muscle weakness (generalized) (M62.81)                Time: 1610-9604 OT Time Calculation (min): 24 min Charges:  OT General Charges $OT Visit: 1 Visit OT Evaluation $OT Eval Moderate Complexity: 1 Mod OT Treatments $Therapeutic Activity: 8-22 mins  Curtis Sites OTR/L  12/18/2018, 2:34 PM

## 2018-12-18 NOTE — ED Notes (Signed)
Attempted report and was advised RN was in a contact room, would call back.

## 2018-12-18 NOTE — Progress Notes (Signed)
PROGRESS NOTE  Ronald Wolf  GGY:[id number] DOB: [birth year] DOA: 12/17/2018 PCP: Mosie Lukes, MD   Brief Narrative: Ronald Wolf is a 73 y.o. male with a history of CVA, HTN, tobacco use, recurrent UTI's, alcohol abuse in remission, GERD, history of bladder CA and medical nonadherence who presented to the ED with progressive AMS and aphasia. Found to be hypertensive, having not taken BP medications regularly. MRI demonstrated acute to subacute left MCA infarcts.  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  - Continue stroke work up. Echo, carotid U/S formal interpretation pending. HbA1c 4.2%. - With significant deficits, PT, OT recommend SNF at discharge. CSW consulted. - Start high intensity statin (LDL 113), ASA per neurology - Permissive HTN  HTN with hypertensive urgency:  - Permissive HTN, but will need good control going forward. Continue ARB.  History of bladder CA:  - Oncology follow up recommended  Anemia of chronic disease:  - Monitor intermittently. - Macrocytosis noted. Check vitamin B12, folate.  Tobacco use:  - Cessation counseling provided.   UTI:  - Empiric abx, monitor urine culture.   DVT prophylaxis: Lovenox Code Status: Full Family Communication: Multiple at bedside Disposition Plan: SNF  Consultants:   Neurology  Procedures:   None  Antimicrobials:  Ceftriaxone   Subjective: Aphasic, stable per family. Denies pain or new numbness, weakness.  Objective: Vitals:   12/18/18 0837 12/18/18 0900 12/18/18 1203 12/18/18 1613  BP: (!) 175/84  (!) 145/81 (!) 168/79  Pulse: 65  61 61  Resp: (!) 24  18 18   Temp: 98.4 F (36.9 C)  98.3 F (36.8 C) 99.1 F (37.3 C)  TempSrc: Oral  Oral Oral    SpO2: 98%  100% 96%  Weight:  69.4 kg    Height:  5\' 11"  (1.803 m)      Intake/Output Summary (Last 24 hours) at 12/18/2018 1807 Last data filed at 12/18/2018 1200 Gross per 24 hour  Intake 600 ml  Output 1250 ml  Net -650 ml   Filed Weights   12/18/18 0900  Weight: 69.4 kg    Gen: 73 y.o. male in no distress  Pulm: Non-labored breathing room air. Clear to auscultation bilaterally.  CV: Regular rate and rhythm. No murmur, rub, or gallop. No JVD, no pedal edema. GI: Abdomen soft, non-tender, non-distended, with normoactive bowel sounds. No organomegaly or masses felt. Ext: Warm, no deformities Skin: No rashes, lesions or ulcers Neuro: Alert and oriented. Expressive aphasia noted, sparse speech, delayed response. In extremities, no focal neurological deficits. Psych: Judgement and insight appear fair. Mood & affect appropriate.   Data Reviewed: I have personally reviewed following labs and imaging studies  CBC: Recent Labs  Lab 12/17/18 2032 12/18/18 0049 12/18/18 0322  WBC 8.5 8.1 7.8  NEUTROABS 4.6  --   --   HGB 14.5 13.7 13.8  HCT 45.0 43.0 44.6  MCV 111.1* 109.4* 110.4*  PLT 267 239 035   Basic Metabolic Panel: Recent Labs  Lab 12/17/18 2032 12/18/18 0049 12/18/18 0322  NA 141  --  139  K 3.9  --  3.7  CL 106  --  106  CO2 24  --  24  GLUCOSE 98  --  101*  BUN 18  --  15  CREATININE 0.98 0.98 0.81  CALCIUM 9.8  --  9.3   GFR: Estimated Creatinine Clearance: 80.9 mL/min (by C-G formula based on SCr of 0.81 mg/dL). Liver Function Tests: Recent Labs  Lab 12/17/18 2032 12/18/18 0322  AST 46* 40  ALT 46* 43  ALKPHOS 75 77  BILITOT 0.9 1.2  PROT 7.8 7.4  ALBUMIN 3.6 3.4*   No results for input(s): LIPASE, AMYLASE in the last 168 hours. No results for input(s): AMMONIA in the last 168 hours. Coagulation Profile: Recent Labs  Lab 12/17/18 2032  INR 0.95   Cardiac Enzymes: Recent Labs  Lab 12/18/18 0049  TROPONINI <0.03   BNP (last 3  results) No results for input(s): PROBNP in the last 8760 hours. HbA1C: Recent Labs    12/18/18 0322  HGBA1C 4.2*   CBG: No results for input(s): GLUCAP in the last 168 hours. Lipid Profile: Recent Labs    12/18/18 0322  CHOL 178  HDL 41  LDLCALC 113*  TRIG 119  CHOLHDL 4.3   Thyroid Function Tests: No results for input(s): TSH, T4TOTAL, FREET4, T3FREE, THYROIDAB in the last 72 hours. Anemia Panel: No results for input(s): VITAMINB12, FOLATE, FERRITIN, TIBC, IRON, RETICCTPCT in the last 72 hours. Urine analysis:    Component Value Date/Time   COLORURINE AMBER (A) 12/17/2018 2159   APPEARANCEUR CLOUDY (A) 12/17/2018 2159   LABSPEC 1.012 12/17/2018 2159   PHURINE 9.0 (H) 12/17/2018 2159   GLUCOSEU NEGATIVE 12/17/2018 2159   GLUCOSEU NEGATIVE 08/21/2015 1412   HGBUR NEGATIVE 12/17/2018 2159   BILIRUBINUR NEGATIVE 12/17/2018 2159   BILIRUBINUR negative 12/20/2017 1846   KETONESUR NEGATIVE 12/17/2018 2159   PROTEINUR 100 (A) 12/17/2018 2159   UROBILINOGEN 0.2 12/20/2017 1846   UROBILINOGEN 0.2 08/21/2015 1412   NITRITE NEGATIVE 12/17/2018 2159   LEUKOCYTESUR TRACE (A) 12/17/2018 2159   No results found for this or any previous visit (from the past 240 hour(s)).    Radiology Studies: Dg Chest 1 View  Result Date: 12/18/2018 CLINICAL DATA:  Initial evaluation for MRI clearance. EXAM: CHEST  1 VIEW COMPARISON:  Prior radiograph from 06/21/2014. FINDINGS: Cardiomegaly. Mediastinal silhouette within normal limits. Aortic atherosclerosis. Lungs mildly hypoinflated. Mild diffuse interstitial congestion without overt pulmonary edema. No pleural effusion. No consolidative opacity. No pneumothorax. No acute osseous abnormality. No radiopaque hardware or other finding to prevent MRI exam. IMPRESSION: 1. Patient clear for MRI.  No radiopaque hardware identified. 2. Cardiomegaly with mild diffuse pulmonary interstitial congestion without overt pulmonary edema. 3. Aortic atherosclerosis.  Electronically Signed   By: Jeannine Boga M.D.   On: 12/18/2018 01:56   Dg Abdomen 1 View  Result Date: 12/18/2018 CLINICAL DATA:  Initial evaluation for MRI screening clearance. EXAM: ABDOMEN - 1 VIEW COMPARISON:  Prior CT from 04/06/2013 FINDINGS: Bowel gas pattern within normal limits without obstruction or ileus. No abnormal bowel wall thickening. No soft tissue mass or abnormal calcification. Multiple surgical clips seen overlying the pelvis, stable from prior CT. Anastomotic suture noted within the right abdomen. No other radiopaque density or findings to prevent MRI exam. Extensive degenerative changes noted within the visualized spine and about the hips bilaterally. IMPRESSION: 1. Patient cleared for MRI. Multiple surgical clips overlying the pelvis with anastomotic suture within the right abdomen felt to be safe for MRI, and are stable relative to prior CT from 2013. 2. Nonobstructive bowel gas pattern Electronically Signed   By: Jeannine Boga M.D.   On: 12/18/2018 01:54   Ct Head Wo Contrast  Result Date: 12/17/2018 CLINICAL DATA:  Nonverbal since awakening from a nap this afternoon, history hypertension, smoker EXAM: CT HEAD WITHOUT CONTRAST TECHNIQUE: Contiguous axial images were obtained from the base of the skull through the vertex without intravenous contrast. Sagittal and coronal MPR images reconstructed from axial data set. COMPARISON:  08/24/2017 FINDINGS: Brain: Generalized atrophy. Normal ventricular morphology. No midline shift or mass effect. Further evolution of LEFT posterior parietal watershed infarct seen previously. Small vessel chronic ischemic changes of deep cerebral white matter. No intracranial hemorrhage, mass lesion, or evidence of acute infarction. No extra-axial fluid collections. Vascular: Atherosclerotic calcifications of internal carotid arteries at skull base Skull: Intact Sinuses/Orbits: Opacified RIGHT frontal sinus with air-fluid level. Remaining  paranasal sinuses and mastoid air cells clear. Other: N/A IMPRESSION: Atrophy with small vessel chronic ischemic changes of deep cerebral white matter. Further evolution of previously identified LEFT posterior parietal watershed infarct. No definite new intracranial abnormalities. Question RIGHT frontal sinusitis. Electronically Signed   By: Lavonia Dana M.D.   On: 12/17/2018 21:00   Mr Brain Wo Contrast  Result Date: 12/18/2018 CLINICAL DATA:  Initial evaluation for acute TIA. EXAM: MRI HEAD WITHOUT CONTRAST MRA HEAD WITHOUT CONTRAST TECHNIQUE: Multiplanar, multiecho pulse sequences of the brain and surrounding structures were obtained without intravenous contrast. Angiographic images of the head were obtained using MRA technique without contrast. COMPARISON:  Prior CT from 12/17/2017 as well as previous MRI from 08/08/2017 and arteriogram from 09/09/2017. FINDINGS: MRI HEAD FINDINGS Brain: Examination mildly degraded by motion artifact. Moderately advanced cerebral atrophy with chronic small vessel ischemic disease. Encephalomalacia with gliosis within the left parieto-occipital region compatible with remote left posterior MCA territory infarcts and/or left MCA/PCA watershed infarcts. Associated chronic hemosiderin staining and/or laminar necrosis within this region. Intrinsic diffusion abnormality related to susceptibility artifact noted about this chronic infarct. Patchy multifocal areas of acute to early subacute ischemic infarcts seen involving the cortical gray matter and underlying hemispheric white matter of the anterior and mid left frontal lobe, left MCA territory (series 5, image 90, 80 for). Associated patchy involvement within the underlying caudate and left lentiform nuclei. Associated minimal faint petechial hemorrhage without hemorrhagic transformation (series 20, image 44). No other evidence for acute or subacute ischemia. Gray-white matter differentiation otherwise maintained. No other evidence  for acute or chronic intracranial hemorrhage. No mass lesion, midline shift or mass effect. Ventricular prominence related global parenchymal volume loss without hydrocephalus. No extra-axial fluid collection. Vascular: Major intracranial vascular flow voids maintained at the skull base. Skull and upper cervical spine: Prominent degenerative changes noted about the C1-2 articulation. Craniocervical junction otherwise unremarkable. Bone marrow signal intensity within normal limits. No scalp soft tissue abnormality. Sinuses/Orbits: Globes and orbital soft tissues within normal limits. Right fronto ethmoidal sinusitis noted. Small bilateral mastoid effusions, of doubtful significance. Inner ear structures grossly normal. Other: None. MRA HEAD FINDINGS ANTERIOR CIRCULATION: Visualized distal cervical segments of the internal carotid arteries are patent with symmetric antegrade flow. Petrous, cavernous, and supraclinoid right ICA patent to the terminus without stenosis. Petrous left ICA widely patent. Short-segment moderate approximately 50% stenosis at the left petrous-cavernous junction. Tandem moderate stenoses involving the cavernous and supraclinoid left ICA of up to approximately 50%. Left ICA terminus well perfused. Right A1 widely patent. Short-segment moderate stenosis within the mid left A1 segment (series 1028, image 9) anterior communicating artery not well assessed on this exam. Anterior cerebral arteries demonstrates mild atheromatous irregularity but are patent to their distal aspects without flow-limiting stenosis. Short-segment moderate approximate 50-70%  stenosis at the mid right M1 segment (series 1028, image 8). Right MCA otherwise patent to the bifurcation. Extensive small vessel atheromatous irregularity seen throughout the right MCA branches. Left M1 segment diffusely irregular, likely reflecting atherosclerotic disease. There is a superimposed short-segment moderate approximate 50% stenosis at the  proximal left M1 segment (series 9, image 98). Left M1 otherwise patent to the bifurcation. Short-segment moderate to severe proximal left M2 stenoses involving both the superior and inferior divisions. Extensive atheromatous irregularity distally within the left MCA branches. POSTERIOR CIRCULATION: Right vertebral artery dominant, with a diffusely hypoplastic left vertebral artery. Atheromatous irregularity throughout the V4 segments compatible with atherosclerotic change. No significant stenosis on the right. There are severe tandem stenoses involving the distal left V4 segment (series 1037, image 6). Posterior inferior cerebral arteries not seen. Diffuse atheromatous irregularity throughout the basilar artery without high-grade stenosis. Superior cerebral arteries patent bilaterally. Both of the posterior cerebral arteries primarily supplied via the basilar. Extensive atheromatous change throughout the PCAs bilaterally without high-grade stenosis. Overall, appearance of the intracranial circulation likely similar as compared to previous arteriogram. IMPRESSION: MRI HEAD IMPRESSION: 1. Patchy multifocal acute to early subacute left MCA territory infarcts involving the left frontal lobe and basal ganglia. Associated faint petechial hemorrhage without frank hemorrhagic transformation. No significant mass effect. 2. Chronic left parieto-occipital infarct 3. Underlying moderately advanced cerebral atrophy with chronic small vessel ischemic disease. 4. Right fronto ethmoidal sinusitis. MRA HEAD IMPRESSION: 1. Negative intracranial MRA for large vessel occlusion. 2. Extensive atheromatous disease throughout the intracranial circulation, with notable findings including moderate tandem cavernous/supraclinoid left ICA stenoses, moderate bilateral M1 stenoses, moderate to severe proximal left M2 branch stenoses, and severe tandem left V4 stenoses. Overall, appearance of the intracranial circulation likely relatively similar  as compared to previous arteriogram from 2018. Electronically Signed   By: Jeannine Boga M.D.   On: 12/18/2018 03:27   Mr Jodene Nam Head Wo Contrast  Result Date: 12/18/2018 CLINICAL DATA:  Initial evaluation for acute TIA. EXAM: MRI HEAD WITHOUT CONTRAST MRA HEAD WITHOUT CONTRAST TECHNIQUE: Multiplanar, multiecho pulse sequences of the brain and surrounding structures were obtained without intravenous contrast. Angiographic images of the head were obtained using MRA technique without contrast. COMPARISON:  Prior CT from 12/17/2017 as well as previous MRI from 08/08/2017 and arteriogram from 09/09/2017. FINDINGS: MRI HEAD FINDINGS Brain: Examination mildly degraded by motion artifact. Moderately advanced cerebral atrophy with chronic small vessel ischemic disease. Encephalomalacia with gliosis within the left parieto-occipital region compatible with remote left posterior MCA territory infarcts and/or left MCA/PCA watershed infarcts. Associated chronic hemosiderin staining and/or laminar necrosis within this region. Intrinsic diffusion abnormality related to susceptibility artifact noted about this chronic infarct. Patchy multifocal areas of acute to early subacute ischemic infarcts seen involving the cortical gray matter and underlying hemispheric white matter of the anterior and mid left frontal lobe, left MCA territory (series 5, image 90, 80 for). Associated patchy involvement within the underlying caudate and left lentiform nuclei. Associated minimal faint petechial hemorrhage without hemorrhagic transformation (series 20, image 44). No other evidence for acute or subacute ischemia. Gray-white matter differentiation otherwise maintained. No other evidence for acute or chronic intracranial hemorrhage. No mass lesion, midline shift or mass effect. Ventricular prominence related global parenchymal volume loss without hydrocephalus. No extra-axial fluid collection. Vascular: Major intracranial vascular flow  voids maintained at the skull base. Skull and upper cervical spine: Prominent degenerative changes noted about the C1-2 articulation. Craniocervical junction otherwise unremarkable. Bone marrow signal intensity within normal limits.  No scalp soft tissue abnormality. Sinuses/Orbits: Globes and orbital soft tissues within normal limits. Right fronto ethmoidal sinusitis noted. Small bilateral mastoid effusions, of doubtful significance. Inner ear structures grossly normal. Other: None. MRA HEAD FINDINGS ANTERIOR CIRCULATION: Visualized distal cervical segments of the internal carotid arteries are patent with symmetric antegrade flow. Petrous, cavernous, and supraclinoid right ICA patent to the terminus without stenosis. Petrous left ICA widely patent. Short-segment moderate approximately 50% stenosis at the left petrous-cavernous junction. Tandem moderate stenoses involving the cavernous and supraclinoid left ICA of up to approximately 50%. Left ICA terminus well perfused. Right A1 widely patent. Short-segment moderate stenosis within the mid left A1 segment (series 1028, image 9) anterior communicating artery not well assessed on this exam. Anterior cerebral arteries demonstrates mild atheromatous irregularity but are patent to their distal aspects without flow-limiting stenosis. Short-segment moderate approximate 50-70% stenosis at the mid right M1 segment (series 1028, image 8). Right MCA otherwise patent to the bifurcation. Extensive small vessel atheromatous irregularity seen throughout the right MCA branches. Left M1 segment diffusely irregular, likely reflecting atherosclerotic disease. There is a superimposed short-segment moderate approximate 50% stenosis at the proximal left M1 segment (series 9, image 98). Left M1 otherwise patent to the bifurcation. Short-segment moderate to severe proximal left M2 stenoses involving both the superior and inferior divisions. Extensive atheromatous irregularity distally  within the left MCA branches. POSTERIOR CIRCULATION: Right vertebral artery dominant, with a diffusely hypoplastic left vertebral artery. Atheromatous irregularity throughout the V4 segments compatible with atherosclerotic change. No significant stenosis on the right. There are severe tandem stenoses involving the distal left V4 segment (series 1037, image 6). Posterior inferior cerebral arteries not seen. Diffuse atheromatous irregularity throughout the basilar artery without high-grade stenosis. Superior cerebral arteries patent bilaterally. Both of the posterior cerebral arteries primarily supplied via the basilar. Extensive atheromatous change throughout the PCAs bilaterally without high-grade stenosis. Overall, appearance of the intracranial circulation likely similar as compared to previous arteriogram. IMPRESSION: MRI HEAD IMPRESSION: 1. Patchy multifocal acute to early subacute left MCA territory infarcts involving the left frontal lobe and basal ganglia. Associated faint petechial hemorrhage without frank hemorrhagic transformation. No significant mass effect. 2. Chronic left parieto-occipital infarct 3. Underlying moderately advanced cerebral atrophy with chronic small vessel ischemic disease. 4. Right fronto ethmoidal sinusitis. MRA HEAD IMPRESSION: 1. Negative intracranial MRA for large vessel occlusion. 2. Extensive atheromatous disease throughout the intracranial circulation, with notable findings including moderate tandem cavernous/supraclinoid left ICA stenoses, moderate bilateral M1 stenoses, moderate to severe proximal left M2 branch stenoses, and severe tandem left V4 stenoses. Overall, appearance of the intracranial circulation likely relatively similar as compared to previous arteriogram from 2018. Electronically Signed   By: Jeannine Boga M.D.   On: 12/18/2018 03:27   Vas US Carotid (at [place] Only)  Result Date: 12/18/2018 Carotid Arterial Duplex Study Indications:   CVA and  Speech disturbance. Risk Factors:  Hypertension, prior CVA. Other Factors: Non compliant with medications.  Performing Technologist: Toma Copier RVS  Examination Guidelines: A complete evaluation includes B-mode imaging, spectral Doppler, color Doppler, and power Doppler as needed of all accessible portions of each vessel. Bilateral testing is considered an integral part of a complete examination. Limited examinations for reoccurring indications may be performed as noted.  Right Carotid Findings: +----------+--------+--------+--------+------------+--------------------+           PSV cm/sEDV cm/sStenosisDescribe    Comments             +----------+--------+--------+--------+------------+--------------------+ CCA Prox  75  4                           mild intimal changes +----------+--------+--------+--------+------------+--------------------+ CCA Distal63      9                           mild intimal changes +----------+--------+--------+--------+------------+--------------------+ ICA Prox  32      7               heterogenousmild plaque          +----------+--------+--------+--------+------------+--------------------+ ICA Mid   67      14                          tortuous             +----------+--------+--------+--------+------------+--------------------+ ICA Distal76      16                          tortuous             +----------+--------+--------+--------+------------+--------------------+ ECA       112     12              heterogenousmild plaque          +----------+--------+--------+--------+------------+--------------------+ +----------+--------+-------+--------+-------------------+           PSV cm/sEDV cmsDescribeArm Pressure (mmHG) +----------+--------+-------+--------+-------------------+ [id number]                                         +----------+--------+-------+--------+-------------------+ +---------+--------+--+--------+-+  VertebralPSV cm/s46EDV cm/s8 +---------+--------+--+--------+-+ 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  Left Carotid Findings: +----------+--------+--------+--------+------------+---------------------------+           PSV cm/sEDV cm/sStenosisDescribe    Comments                    +----------+--------+--------+--------+------------+---------------------------+ CCA Prox  84      3                           mild intimal changes        +----------+--------+--------+--------+------------+---------------------------+ CCA Distal61      9                           mild intimal changes        +----------+--------+--------+--------+------------+---------------------------+ ICA Prox  50      10              heterogenousmild plaque                 +----------+--------+--------+--------+------------+---------------------------+ ICA Mid   86      19              heterogenousNot visualized              +----------+--------+--------+--------+------------+---------------------------+ ICA Distal                                    unable to visualize the  most distal ICA due to high                                               bifurcation]                +----------+--------+--------+--------+------------+---------------------------+ ECA       36      5               heterogenousmild plaque                 +----------+--------+--------+--------+------------+---------------------------+ +----------+--------+--------+--------+-------------------+ SubclavianPSV cm/sEDV cm/sDescribeArm Pressure (mmHG) +----------+--------+--------+--------+-------------------+           151                                         +----------+--------+--------+--------+-------------------+ +---------+--------+--+--------+-+ VertebralPSV cm/s34EDV cm/s6 +---------+--------+--+--------+-+  Technically difficult due to 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  Summary: Right Carotid: Velocities in the right ICA are consistent with a 1-39% stenosis.                See technician notes listed above. Left Carotid: Velocities in the left ICA are consistent with a 1-39% stenosis.               See technician notes listed above. Vertebrals:  Bilateral vertebral arteries demonstrate antegrade flow. Subclavians: Normal flow hemodynamics were seen in bilateral subclavian              arteries. *See table(s) above for measurements and observations.     Preliminary     Scheduled Meds: . aspirin  300 mg Rectal Daily   Or  . aspirin  325 mg Oral Daily  . atorvastatin  40 mg Oral q1800  . enoxaparin (LOVENOX) injection  40 mg Subcutaneous Daily  . losartan  100 mg Oral Daily   Continuous Infusions: . sodium chloride 100 mL/hr at 12/18/18 0032     LOS: 1 day   Time spent: 25 minutes.  Patrecia Pour, MD [facility] [email or web address] Password [facility] 12/18/2018, 6:07 PM

## 2018-12-18 NOTE — Progress Notes (Signed)
Echocardiogram 2D Echocardiogram has been performed.  12/18/2018 3:23 PM Maudry Mayhew, MHA, RVT, RDCS, RDMS

## 2018-12-18 NOTE — ED Notes (Signed)
Patient transported to MRI 

## 2018-12-18 NOTE — Progress Notes (Signed)
Bilateral carotid duplex completed- Technically difficult due to extremely high bifurcations. Preliminary results found in CV PROC under chart review. Liberal Eather Chaires,RVS 12/18/2018. 3:10 PM

## 2018-12-18 NOTE — Evaluation (Signed)
Physical Therapy Evaluation Patient Details Name: Ronald Wolf MRN: [id number] DOB: 1946/03/22 Today's Date: 12/18/2018   History of Present Illness  Ronald Wolf is a 73 y.o. male with medical history significant of previous CVA, hypertension, recurrent UTIs, previous alcohol abuse, GERD, history of bladder cancer and medication noncompliance , brought in with AMS and imaging now showing L MCA infarct  Clinical Impression  Pt admitted with/for AMS and found to have suffered a stroke.  Presently, this patient needs minimal assist for basic mobility and gait.  Pt could benefit from ST rehab at the SNF level to improve strength, stability and overall function prior to getting home..  Pt currently limited functionally due to the problems listed. ( See problems list.)   Pt will benefit from PT to maximize function and safety in order to get ready for next venue listed below.     Follow Up Recommendations SNF;Supervision/Assistance - 24 hour    Equipment Recommendations  None recommended by PT    Recommendations for Other Services       Precautions / Restrictions Precautions Precautions: Fall Restrictions Weight Bearing Restrictions: No      Mobility  Bed Mobility Overal bed mobility: Needs Assistance Bed Mobility: Sit to Supine;Supine to Sit     Supine to sit: Min assist Sit to supine: Min guard   General bed mobility comments: stability assist and extra time to get OOB  Transfers Overall transfer level: Needs assistance Equipment used: Rolling walker (2 wheeled) Transfers: Sit to/from Stand Sit to Stand: Min guard Stand pivot transfers: Min assist       General transfer comment: cuing for hand placement and pt used legs for stability against the bed frame.  Ambulation/Gait Ambulation/Gait assistance: Min Web designer (Feet): 160 Feet Assistive device: None;IV Pole Gait Pattern/deviations: Step-through pattern;Decreased step length - right;Decreased step  length - left;Decreased stance time - right;Decreased stride length Gait velocity: slow Gait velocity interpretation: <1.31 ft/sec, indicative of household ambulator General Gait Details: short, low amplitude steps, tentative gait with hand held stiff in a guard position.  pt consistently reaching for the IV pole due to clearly not confident.  Stairs            Wheelchair Mobility    Modified Rankin (Stroke Patients Only) Modified Rankin (Stroke Patients Only) Pre-Morbid Rankin Score: Slight disability Modified Rankin: Moderately severe disability     Balance Overall balance assessment: Mild deficits observed, not formally tested                                           Pertinent Vitals/Pain Pain Assessment: No/denies pain    Home Living Family/patient expects to be discharged to:: Private residence Living Arrangements: Spouse/significant other Available Help at Discharge: Family;Available 24 hours/day Type of Home: House Home Access: Level entry     Home Layout: One level Home Equipment: Cane - single point Additional Comments: All information supplied by pt's son and brother    Prior Function Level of Independence: Independent with assistive device(s)         Comments: Pt's girlfriend performed all home manegement tasks, overall independent with ADLs, occasonally used cane     Hand Dominance   Dominant Hand: Right    Extremity/Trunk Assessment   Upper Extremity Assessment Upper Extremity Assessment: Defer to OT evaluation RUE Deficits / Details: 4/5, decreased coordination RUE Coordination: decreased fine motor  Lower Extremity Assessment Lower Extremity Assessment: Difficult to assess due to impaired cognition(no overt focal weakness, R mildly weaker)    Cervical / Trunk Assessment Cervical / Trunk Assessment: Kyphotic  Communication   Communication: Receptive difficulties;Expressive difficulties  Cognition  Arousal/Alertness: Awake/alert Behavior During Therapy: WFL for tasks assessed/performed Overall Cognitive Status: Impaired/Different from baseline Area of Impairment: Orientation;Attention;Memory;Following commands;Safety/judgement;Awareness;Problem solving                 Orientation Level: Time;Situation Current Attention Level: Sustained Memory: Decreased short-term memory Following Commands: Follows one step commands inconsistently Safety/Judgement: Decreased awareness of safety Awareness: Intellectual Problem Solving: Slow processing;Difficulty sequencing;Decreased initiation;Requires verbal cues;Requires tactile cues        General Comments General comments (skin integrity, edema, etc.): pt's significant other/room mate was prest for the end of the session    Exercises     Assessment/Plan    PT Assessment Patient needs continued PT services  PT Problem List Decreased strength;Decreased balance;Decreased mobility;Decreased coordination       PT Treatment Interventions DME instruction;Gait training;Functional mobility training;Therapeutic activities;Balance training;Patient/family education;Neuromuscular re-education    PT Goals (Current goals can be found in the Care Plan section)  Acute Rehab PT Goals Patient Stated Goal: none stated PT Goal Formulation: Patient unable to participate in goal setting Time For Goal Achievement: 12/25/18 Potential to Achieve Goals: Good    Frequency Min 3X/week   Barriers to discharge        Co-evaluation               AM-PAC PT "6 Clicks" Mobility  Outcome Measure Help needed turning from your back to your side while in a flat bed without using bedrails?: A Little Help needed moving from lying on your back to sitting on the side of a flat bed without using bedrails?: A Little Help needed moving to and from a bed to a chair (including a wheelchair)?: A Little Help needed standing up from a chair using your arms  (e.g., wheelchair or bedside chair)?: A Little Help needed to walk in hospital room?: A Little Help needed climbing 3-5 steps with a railing? : A Little 6 Click Score: 18    End of Session   Activity Tolerance: Patient tolerated treatment well;Patient limited by fatigue Patient left: in bed;with call bell/phone within reach;with bed alarm set;with family/visitor present Nurse Communication: Mobility status PT Visit Diagnosis: Unsteadiness on feet (R26.81);Other abnormalities of gait and mobility (R26.89);Hemiplegia and hemiparesis Hemiplegia - Right/Left: Right Hemiplegia - dominant/non-dominant: Dominant Hemiplegia - caused by: Cerebral infarction    Time: 0037-0488 PT Time Calculation (min) (ACUTE ONLY): 29 min   Charges:   PT Evaluation $PT Eval Moderate Complexity: 1 Mod PT Treatments $Gait Training: 8-22 mins        12/18/2018  Donnella Sham, PT Acute Rehabilitation Services [phone]  (pager) [phone]  (office)  Tessie Fass Jeb Schloemer 12/18/2018, 5:13 PM

## 2018-12-18 NOTE — Progress Notes (Signed)
Report received from [place], [place] in the ED. Patient last seen normal Friday, and came to the ED on Sunday, after patient became totally aphasic.Alert, patient is pleasant and cooperative. Passed swallow and is on a heart healthy diet. CT was negative. MRI positive for infarct. Denies pain. Awaiting patient.

## 2018-12-19 DIAGNOSIS — I63312 Cerebral infarction due to thrombosis of left middle cerebral artery: Secondary | ICD-10-CM

## 2018-12-19 LAB — URINE CULTURE

## 2018-12-19 LAB — FOLATE: Folate: 7 ng/mL (ref >5.9)

## 2018-12-19 LAB — VITAMIN B12: Vitamin B-12: 322 pg/mL (ref 180–914)

## 2018-12-19 MED ORDER — ATORVASTATIN CALCIUM 80 MG PO TABS
80.0000 mg | ORAL_TABLET | Freq: Every day | ORAL | Status: DC
  Administered 2018-12-19 – 2018-12-23 (×5): 80 mg via ORAL
  Filled 2018-12-19 (×5): qty 1

## 2018-12-19 NOTE — NC FL2 (Signed)
[place] LEVEL OF CARE SCREENING TOOL     IDENTIFICATION  Patient Name: Ronald Wolf Birthdate: Jul 31, 1946 Sex: male Admission Date (Current Location): 12/17/2018  [facility] and [place] Number:  Herbalist and Address:  [facility], [place] [address], [place], [place] [address]      Provider Number: [id number]  Attending Physician Name and Address:  Patrecia Pour, MD  Relative Name and Phone Number:       Current Level of Care: Hospital Recommended Level of Care: [place] Prior Approval Number:    Date Approved/Denied:   PASRR Number: [id number]  Discharge Plan: SNF    Current Diagnoses: Patient Active Problem List   Diagnosis Date Noted  . CVA (cerebral vascular accident) ([place]) 12/17/2018  . 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 Alcohol use, unspecified with unspecified alcohol-induced disorder ([place]) 12/25/2017  . Stroke ([place]) 12/20/2017  . Decreased visual acuity 06/18/2015  . History of bladder cancer 04/24/2015  . History of UTI 03/27/2015  . UTI (urinary tract infection) 11/21/2014  . Fatigue 10/23/2014  . Hyperglycemia 10/23/2014  . Wellness examination 10/23/2014  . PVD (peripheral vascular disease) ([place]) 07/28/2014  . Anxiety and depression 07/28/2014  . Palpitations 06/21/2014  . Cough 09/26/2013  . Atherosclerosis of arteries 05/13/2013  . Smoking 05/03/2013  . Lesion of liver 04/01/2013  . Rhinitis 10/05/2012  . Chronic scapular pain 10/05/2012  . Anemia 09/17/2012  . Bone lesion 09/17/2012  . HTN (hypertension) 06/12/2012  . Trigeminal neuralgia 05/28/2012    Orientation RESPIRATION BLADDER Height & Weight     Self, Place  Normal Incontinent Weight: 153 lb (69.4 kg) Height:  5\' 11"  (180.3 cm)  BEHAVIORAL SYMPTOMS/MOOD NEUROLOGICAL BOWEL NUTRITION STATUS      Continent Diet(see DC summary)  AMBULATORY STATUS COMMUNICATION OF NEEDS Skin   Limited Assist Verbally Normal                        Personal Care Assistance Level of Assistance  Bathing, Feeding, Dressing Bathing Assistance: Limited assistance Feeding assistance: Independent Dressing Assistance: Limited assistance     Functional Limitations Info  Sight, Hearing, Speech Sight Info: Adequate Hearing Info: Adequate Speech Info: Impaired(expressive aphasia)    SPECIAL CARE FACTORS FREQUENCY  PT (By licensed PT), OT (By licensed OT), Speech therapy     PT Frequency: 5x/wk OT Frequency: 5x/wk     Speech Therapy Frequency: 5x/wk      Contractures Contractures Info: Not present    Additional Factors Info  Code Status, Allergies Code Status Info: Full Allergies Info: Iohexol           Current Medications (12/19/2018):  This is the current hospital active medication list Current Facility-Administered Medications  Medication Dose Route Frequency Provider Last Rate Last Dose  . 0.9 %  sodium chloride infusion   Intravenous Continuous Elwyn Reach, MD 100 mL/hr at 12/18/18 2257    . acetaminophen (TYLENOL) tablet 650 mg  650 mg Oral Q4H PRN Elwyn Reach, MD       Or  . acetaminophen (TYLENOL) solution 650 mg  650 mg Per Tube Q4H PRN Elwyn Reach, MD       Or  . acetaminophen (TYLENOL) suppository 650 mg  650 mg Rectal Q4H PRN Elwyn Reach, MD      . aspirin EC tablet 81 mg  81 mg Oral Daily Aroor, Lanice Schwab, MD   81 mg  at 12/19/18 1026  . atorvastatin (LIPITOR) tablet 40 mg  40 mg Oral q1800 Elwyn Reach, MD   40 mg at 12/18/18 1807  . clopidogrel (PLAVIX) tablet 75 mg  75 mg Oral Daily Aroor, Lanice Schwab, MD   75 mg at 12/19/18 1026  . enoxaparin (LOVENOX) injection 40 mg  40 mg Subcutaneous Daily Gala Romney L, MD   40 mg at 12/19/18 1027  . losartan (COZAAR) tablet 100 mg  100 mg Oral Daily Gala Romney L, MD   100 mg at 12/19/18 1026  . senna-docusate (Senokot-S) tablet 1 tablet  1 tablet Oral QHS PRN Elwyn Reach, MD         Discharge  Medications: Please see discharge summary for a list of discharge medications.  Relevant Imaging Results:  Relevant Lab Results:   Additional Information SS#: [id number]  Geralynn Ochs, LCSW

## 2018-12-19 NOTE — Clinical Social Work Note (Signed)
Clinical Social Work Assessment  Patient Details  Name: Ronald Wolf MRN: [id number] Date of Birth: [birth year]  Date of referral:  12/19/18               Reason for consult:  Facility Placement                Permission sought to share information with:  Facility Sport and exercise psychologist, Family Supports Permission granted to share information::  Yes, Verbal Permission Granted  Name::     Ronald Wolf  Agency::  SNF  Relationship::  Daughter, Environmental manager Information:     Housing/Transportation Living arrangements for the past 2 months:  Single Family Home Source of Information:  Patient, Medical Team, Siblings, Adult Children Patient Interpreter Needed:  None Criminal Activity/Legal Involvement Pertinent to Current Situation/Hospitalization:  No - Comment as needed Significant Relationships:  Siblings, Significant Other, Adult Children Lives with:  Self, Significant Other Do you feel safe going back to the place where you live?  Yes Need for family participation in patient care:  Yes (Comment)  Care giving concerns:  Patient from home with significant other, but will need short term rehab at discharge. Patient's family asking whether patient would need placement or could go home.   Social Worker assessment / plan:  CSW met with patient, significant other, and brother at bedside to discuss recommendation for SNF. CSW discussed expectations for SNF placement, and answered family's questions about home health. Patient said he'd like to talk it over with his family, and come up with a decision later. Patient's brother told CSW to also call and discuss with patient's daughter. CSW called and spoke with patient's daughter over the phone, and patient's daughter said she definitely thinks that the patient needs SNF at discharge. CSW emailed patient's daughter information on facility options, and she will review to come up with preferences. CSW faxed out referral and will follow.  Employment  status:  Retired Nurse, adult PT Recommendations:  [place] / Referral to community resources:  [place]  Patient/Family's Response to care:  Patient was unsure about what he wanted to do, but patient's daughter said that the patient will definitely need SNF placement at discharge.  Patient/Family's Understanding of and Emotional Response to Diagnosis, Current Treatment, and Prognosis:  Patient's family aware that the patient will need assistance when he leaves the hospital. Patient's brother asked about whether SNF would help the family with figuring out what they needed to do to help the patient after he gets back home, as they don't really know what they'd need to do therapy-wise because the patient was doing his own stuff at home before this happened. Patient's daughter appreciative of information as well and will review list.  Emotional Assessment Appearance:  Appears stated age Attitude/Demeanor/Rapport:  Engaged Affect (typically observed):  Pleasant Orientation:  Oriented to Self, Oriented to Place Alcohol / Substance use:  Not Applicable Psych involvement (Current and /or in the community):  No (Comment)  Discharge Needs  Concerns to be addressed:  Care Coordination Readmission within the last 30 days:  No Current discharge risk:  Physical Impairment, Dependent with Mobility Barriers to Discharge:  Continued Medical Work up, [place], [place] 12/19/2018, 3:54 PM

## 2018-12-19 NOTE — Progress Notes (Signed)
PROGRESS NOTE  Ronald Wolf  GHW:[id number] DOB: 1946-04-16 DOA: 12/17/2018 PCP: Mosie Lukes, MD   Brief Narrative: Ronald Wolf is a 73 y.o. male with a history of prior CVA, HTN, tobacco use, recurrent UTI's, alcohol abuse in remission, GERD, history of bladder CA and medical nonadherence who presented to the ED with progressive AMS and aphasia. Found to be hypertensive, having not taken BP medications regularly. Initial concern was for UTI, though MRI demonstrated acute to subacute watershed left MCA infarcts. CT angiography revealed multivessel atherosclerotic disease with mod-severe left ICA stenosis and severe M2 stenosis. High-dose statin and dual antiplatelet therapy were initiated and therapy consulted. Current recommendation is for acute rehabilitation at SNF, CSW consulted, FL2 signed.  Assessment & Plan: Principal Problem:   CVA (cerebral vascular accident) ([place]) Active Problems:   HTN (hypertension)   Anemia   Smoking   UTI (urinary tract infection)   History of bladder cancer  Acute MCA territory CVA's on chronic CVD: With resultant expressive aphasia. No CES on echocardiogram. Carotid U/S with bilateral 1-39% stenosis with high level of bifurcation. HbA1c 4.2%. - Discussed with neurology, Dr. Leonie Man. Due to atherosclerotic burden, pt was loaded with plavix 300mg  followed by [facility] with plans to continue aspirin indefinitely and plavix for 3 months. - With significant deficits, PT, OT recommend SNF at discharge. CSW consulted. - Started high intensity statin (LDL 113) - Permissive HTN per neuro  HTN with hypertensive urgency:  - Permissive HTN, but will need good control going forward. Continue ARB.  History of bladder CA:  - Oncology follow up recommended  Anemia of chronic disease:  - Monitor intermittently. - Macrocytosis noted, though B12 and folate are wnl.  Tobacco use:  - Cessation counseling provided.   Pyuria: Urine culture nonclonal. This is not likely a  true infection. No leukocytosis or fever.   DVT prophylaxis: Lovenox Code Status: Full Family Communication: Multiple at bedside Disposition Plan: Medically stable for discharge to SNF. CSW working diligently.  Consultants:   Neurology, stroke team  Procedures:   None  Antimicrobials:  None  Subjective: Aphasia is improving but still severe. Able to swallow, consents to going to rehab for acute therapy. No new symptoms to speak of.   Objective: Vitals:   12/18/18 1613 12/18/18 1953 12/18/18 2331 12/19/18 0338  BP: (!) 168/79 (!) 172/76 (!) 173/86 (!) 176/84  Pulse: 61 66 (!) 59 (!) 53  Resp: 18 (!) 21 (!) 21 20  Temp: 99.1 F (37.3 C) 98.7 F (37.1 C) 98.6 F (37 C) 98.4 F (36.9 C)  TempSrc: Oral Oral Oral Oral  SpO2: 96% 100% 100% 99%  Weight:      Height:        Intake/Output Summary (Last 24 hours) at 12/19/2018 1445 Last data filed at 12/19/2018 1200 Gross per 24 hour  Intake 820 ml  Output 1510 ml  Net -690 ml   Filed Weights   12/18/18 0900  Weight: 69.4 kg   Gen: 73 y.o. male in no distress Pulm: Nonlabored breathing room air. Clear. CV: Regular rate and rhythm. No murmur, rub, or gallop. No JVD, no dependent edema. GI: Abdomen soft, non-tender, non-distended, with normoactive bowel sounds.  Ext: Warm, no deformities. Distal pulses decreased, ~1+. Skin: No rashes, lesions or ulcers on visualized skin.  Neuro: Alert and oriented. Sparse words with appropriate content and phonation. Slow response times, moves all extremities. Psych: Judgement and insight appear fair. Mood euthymic & affect congruent. Behavior is appropriate.  Data Reviewed: I have personally reviewed following labs and imaging studies  CBC: Recent Labs  Lab 12/17/18 2032 12/18/18 0049 12/18/18 0322  WBC 8.5 8.1 7.8  NEUTROABS 4.6  --   --   HGB 14.5 13.7 13.8  HCT 45.0 43.0 44.6  MCV 111.1* 109.4* 110.4*  PLT 267 239 244   Basic Metabolic Panel: Recent Labs  Lab  12/17/18 2032 12/18/18 0049 12/18/18 0322  NA 141  --  139  K 3.9  --  3.7  CL 106  --  106  CO2 24  --  24  GLUCOSE 98  --  101*  BUN 18  --  15  CREATININE 0.98 0.98 0.81  CALCIUM 9.8  --  9.3   GFR: Estimated Creatinine Clearance: 80.9 mL/min (by C-G formula based on SCr of 0.81 mg/dL). Liver Function Tests: Recent Labs  Lab 12/17/18 2032 12/18/18 0322  AST 46* 40  ALT 46* 43  ALKPHOS 75 77  BILITOT 0.9 1.2  PROT 7.8 7.4  ALBUMIN 3.6 3.4*   No results for input(s): LIPASE, AMYLASE in the last 168 hours. No results for input(s): AMMONIA in the last 168 hours. Coagulation Profile: Recent Labs  Lab 12/17/18 2032  INR 0.95   Cardiac Enzymes: Recent Labs  Lab 12/18/18 0049  TROPONINI <0.03   BNP (last 3 results) No results for input(s): PROBNP in the last 8760 hours. HbA1C: Recent Labs    12/18/18 0322  HGBA1C 4.2*   CBG: No results for input(s): GLUCAP in the last 168 hours. Lipid Profile: Recent Labs    12/18/18 0322  CHOL 178  HDL 41  LDLCALC 113*  TRIG 119  CHOLHDL 4.3   Thyroid Function Tests: No results for input(s): TSH, T4TOTAL, FREET4, T3FREE, THYROIDAB in the last 72 hours. Anemia Panel: Recent Labs    12/19/18 0619  VITAMINB12 322  FOLATE 7.0   Urine analysis:    Component Value Date/Time   COLORURINE AMBER (A) 12/17/2018 2159   APPEARANCEUR CLOUDY (A) 12/17/2018 2159   LABSPEC 1.012 12/17/2018 2159   PHURINE 9.0 (H) 12/17/2018 2159   GLUCOSEU NEGATIVE 12/17/2018 2159   GLUCOSEU NEGATIVE 08/21/2015 1412   HGBUR NEGATIVE 12/17/2018 2159   BILIRUBINUR NEGATIVE 12/17/2018 2159   BILIRUBINUR negative 12/20/2017 1846   KETONESUR NEGATIVE 12/17/2018 2159   PROTEINUR 100 (A) 12/17/2018 2159   UROBILINOGEN 0.2 12/20/2017 1846   UROBILINOGEN 0.2 08/21/2015 1412   NITRITE NEGATIVE 12/17/2018 2159   LEUKOCYTESUR TRACE (A) 12/17/2018 2159   Recent Results (from the past 240 hour(s))  Urine Culture     Status: Abnormal    Collection Time: 12/17/18 11:00 PM  Result Value Ref Range Status   Specimen Description URINE, RANDOM  Final   Special Requests   Final    NONE Performed at [facility], [place] [address], [place], [place] [address]    Culture MULTIPLE SPECIES PRESENT, SUGGEST RECOLLECTION (A)  Final   Report Status 12/19/2018 FINAL  Final      Radiology Studies: Dg Chest 1 View  Result Date: 12/18/2018 CLINICAL DATA:  Initial evaluation for MRI clearance. EXAM: CHEST  1 VIEW COMPARISON:  Prior radiograph from 06/21/2014. FINDINGS: Cardiomegaly. Mediastinal silhouette within normal limits. Aortic atherosclerosis. Lungs mildly hypoinflated. Mild diffuse interstitial congestion without overt pulmonary edema. No pleural effusion. No consolidative opacity. No pneumothorax. No acute osseous abnormality. No radiopaque hardware or other finding to prevent MRI exam. IMPRESSION: 1. Patient clear for MRI.  No radiopaque hardware identified.  2. Cardiomegaly with mild diffuse pulmonary interstitial congestion without overt pulmonary edema. 3. Aortic atherosclerosis. Electronically Signed   By: Jeannine Boga M.D.   On: 12/18/2018 01:56   Dg Abdomen 1 View  Result Date: 12/18/2018 CLINICAL DATA:  Initial evaluation for MRI screening clearance. EXAM: ABDOMEN - 1 VIEW COMPARISON:  Prior CT from 04/06/2013 FINDINGS: Bowel gas pattern within normal limits without obstruction or ileus. No abnormal bowel wall thickening. No soft tissue mass or abnormal calcification. Multiple surgical clips seen overlying the pelvis, stable from prior CT. Anastomotic suture noted within the right abdomen. No other radiopaque density or findings to prevent MRI exam. Extensive degenerative changes noted within the visualized spine and about the hips bilaterally. IMPRESSION: 1. Patient cleared for MRI. Multiple surgical clips overlying the pelvis with anastomotic suture within the right abdomen felt to be safe for MRI, and are stable relative  to prior CT from 2013. 2. Nonobstructive bowel gas pattern Electronically Signed   By: Jeannine Boga M.D.   On: 12/18/2018 01:54   Ct Head Wo Contrast  Result Date: 12/17/2018 CLINICAL DATA:  Nonverbal since awakening from a nap this afternoon, history hypertension, smoker EXAM: CT HEAD WITHOUT CONTRAST TECHNIQUE: Contiguous axial images were obtained from the base of the skull through the vertex without intravenous contrast. Sagittal and coronal MPR images reconstructed from axial data set. COMPARISON:  08/24/2017 FINDINGS: Brain: Generalized atrophy. Normal ventricular morphology. No midline shift or mass effect. Further evolution of LEFT posterior parietal watershed infarct seen previously. Small vessel chronic ischemic changes of deep cerebral white matter. No intracranial hemorrhage, mass lesion, or evidence of acute infarction. No extra-axial fluid collections. Vascular: Atherosclerotic calcifications of internal carotid arteries at skull base Skull: Intact Sinuses/Orbits: Opacified RIGHT frontal sinus with air-fluid level. Remaining paranasal sinuses and mastoid air cells clear. Other: N/A IMPRESSION: Atrophy with small vessel chronic ischemic changes of deep cerebral white matter. Further evolution of previously identified LEFT posterior parietal watershed infarct. No definite new intracranial abnormalities. Question RIGHT frontal sinusitis. Electronically Signed   By: Lavonia Dana M.D.   On: 12/17/2018 21:00   Mr Brain Wo Contrast  Result Date: 12/18/2018 CLINICAL DATA:  Initial evaluation for acute TIA. EXAM: MRI HEAD WITHOUT CONTRAST MRA HEAD WITHOUT CONTRAST TECHNIQUE: Multiplanar, multiecho pulse sequences of the brain and surrounding structures were obtained without intravenous contrast. Angiographic images of the head were obtained using MRA technique without contrast. COMPARISON:  Prior CT from 12/17/2017 as well as previous MRI from 08/08/2017 and arteriogram from 09/09/2017. FINDINGS:  MRI HEAD FINDINGS Brain: Examination mildly degraded by motion artifact. Moderately advanced cerebral atrophy with chronic small vessel ischemic disease. Encephalomalacia with gliosis within the left parieto-occipital region compatible with remote left posterior MCA territory infarcts and/or left MCA/PCA watershed infarcts. Associated chronic hemosiderin staining and/or laminar necrosis within this region. Intrinsic diffusion abnormality related to susceptibility artifact noted about this chronic infarct. Patchy multifocal areas of acute to early subacute ischemic infarcts seen involving the cortical gray matter and underlying hemispheric white matter of the anterior and mid left frontal lobe, left MCA territory (series 5, image 90, 80 for). Associated patchy involvement within the underlying caudate and left lentiform nuclei. Associated minimal faint petechial hemorrhage without hemorrhagic transformation (series 20, image 44). No other evidence for acute or subacute ischemia. Gray-white matter differentiation otherwise maintained. No other evidence for acute or chronic intracranial hemorrhage. No mass lesion, midline shift or mass effect. Ventricular prominence related global parenchymal volume loss without hydrocephalus. No extra-axial fluid  collection. Vascular: Major intracranial vascular flow voids maintained at the skull base. Skull and upper cervical spine: Prominent degenerative changes noted about the C1-2 articulation. Craniocervical junction otherwise unremarkable. Bone marrow signal intensity within normal limits. No scalp soft tissue abnormality. Sinuses/Orbits: Globes and orbital soft tissues within normal limits. Right fronto ethmoidal sinusitis noted. Small bilateral mastoid effusions, of doubtful significance. Inner ear structures grossly normal. Other: None. MRA HEAD FINDINGS ANTERIOR CIRCULATION: Visualized distal cervical segments of the internal carotid arteries are patent with symmetric  antegrade flow. Petrous, cavernous, and supraclinoid right ICA patent to the terminus without stenosis. Petrous left ICA widely patent. Short-segment moderate approximately 50% stenosis at the left petrous-cavernous junction. Tandem moderate stenoses involving the cavernous and supraclinoid left ICA of up to approximately 50%. Left ICA terminus well perfused. Right A1 widely patent. Short-segment moderate stenosis within the mid left A1 segment (series 1028, image 9) anterior communicating artery not well assessed on this exam. Anterior cerebral arteries demonstrates mild atheromatous irregularity but are patent to their distal aspects without flow-limiting stenosis. Short-segment moderate approximate 50-70% stenosis at the mid right M1 segment (series 1028, image 8). Right MCA otherwise patent to the bifurcation. Extensive small vessel atheromatous irregularity seen throughout the right MCA branches. Left M1 segment diffusely irregular, likely reflecting atherosclerotic disease. There is a superimposed short-segment moderate approximate 50% stenosis at the proximal left M1 segment (series 9, image 98). Left M1 otherwise patent to the bifurcation. Short-segment moderate to severe proximal left M2 stenoses involving both the superior and inferior divisions. Extensive atheromatous irregularity distally within the left MCA branches. POSTERIOR CIRCULATION: Right vertebral artery dominant, with a diffusely hypoplastic left vertebral artery. Atheromatous irregularity throughout the V4 segments compatible with atherosclerotic change. No significant stenosis on the right. There are severe tandem stenoses involving the distal left V4 segment (series 1037, image 6). Posterior inferior cerebral arteries not seen. Diffuse atheromatous irregularity throughout the basilar artery without high-grade stenosis. Superior cerebral arteries patent bilaterally. Both of the posterior cerebral arteries primarily supplied via the basilar.  Extensive atheromatous change throughout the PCAs bilaterally without high-grade stenosis. Overall, appearance of the intracranial circulation likely similar as compared to previous arteriogram. IMPRESSION: MRI HEAD IMPRESSION: 1. Patchy multifocal acute to early subacute left MCA territory infarcts involving the left frontal lobe and basal ganglia. Associated faint petechial hemorrhage without frank hemorrhagic transformation. No significant mass effect. 2. Chronic left parieto-occipital infarct 3. Underlying moderately advanced cerebral atrophy with chronic small vessel ischemic disease. 4. Right fronto ethmoidal sinusitis. MRA HEAD IMPRESSION: 1. Negative intracranial MRA for large vessel occlusion. 2. Extensive atheromatous disease throughout the intracranial circulation, with notable findings including moderate tandem cavernous/supraclinoid left ICA stenoses, moderate bilateral M1 stenoses, moderate to severe proximal left M2 branch stenoses, and severe tandem left V4 stenoses. Overall, appearance of the intracranial circulation likely relatively similar as compared to previous arteriogram from 2018. Electronically Signed   By: Jeannine Boga M.D.   On: 12/18/2018 03:27   Mr Jodene Nam Head Wo Contrast  Result Date: 12/18/2018 CLINICAL DATA:  Initial evaluation for acute TIA. EXAM: MRI HEAD WITHOUT CONTRAST MRA HEAD WITHOUT CONTRAST TECHNIQUE: Multiplanar, multiecho pulse sequences of the brain and surrounding structures were obtained without intravenous contrast. Angiographic images of the head were obtained using MRA technique without contrast. COMPARISON:  Prior CT from 12/17/2017 as well as previous MRI from 08/08/2017 and arteriogram from 09/09/2017. FINDINGS: MRI HEAD FINDINGS Brain: Examination mildly degraded by motion artifact. Moderately advanced cerebral atrophy with chronic small vessel ischemic disease.  Encephalomalacia with gliosis within the left parieto-occipital region compatible with remote  left posterior MCA territory infarcts and/or left MCA/PCA watershed infarcts. Associated chronic hemosiderin staining and/or laminar necrosis within this region. Intrinsic diffusion abnormality related to susceptibility artifact noted about this chronic infarct. Patchy multifocal areas of acute to early subacute ischemic infarcts seen involving the cortical gray matter and underlying hemispheric white matter of the anterior and mid left frontal lobe, left MCA territory (series 5, image 90, 80 for). Associated patchy involvement within the underlying caudate and left lentiform nuclei. Associated minimal faint petechial hemorrhage without hemorrhagic transformation (series 20, image 44). No other evidence for acute or subacute ischemia. Gray-white matter differentiation otherwise maintained. No other evidence for acute or chronic intracranial hemorrhage. No mass lesion, midline shift or mass effect. Ventricular prominence related global parenchymal volume loss without hydrocephalus. No extra-axial fluid collection. Vascular: Major intracranial vascular flow voids maintained at the skull base. Skull and upper cervical spine: Prominent degenerative changes noted about the C1-2 articulation. Craniocervical junction otherwise unremarkable. Bone marrow signal intensity within normal limits. No scalp soft tissue abnormality. Sinuses/Orbits: Globes and orbital soft tissues within normal limits. Right fronto ethmoidal sinusitis noted. Small bilateral mastoid effusions, of doubtful significance. Inner ear structures grossly normal. Other: None. MRA HEAD FINDINGS ANTERIOR CIRCULATION: Visualized distal cervical segments of the internal carotid arteries are patent with symmetric antegrade flow. Petrous, cavernous, and supraclinoid right ICA patent to the terminus without stenosis. Petrous left ICA widely patent. Short-segment moderate approximately 50% stenosis at the left petrous-cavernous junction. Tandem moderate stenoses  involving the cavernous and supraclinoid left ICA of up to approximately 50%. Left ICA terminus well perfused. Right A1 widely patent. Short-segment moderate stenosis within the mid left A1 segment (series 1028, image 9) anterior communicating artery not well assessed on this exam. Anterior cerebral arteries demonstrates mild atheromatous irregularity but are patent to their distal aspects without flow-limiting stenosis. Short-segment moderate approximate 50-70% stenosis at the mid right M1 segment (series 1028, image 8). Right MCA otherwise patent to the bifurcation. Extensive small vessel atheromatous irregularity seen throughout the right MCA branches. Left M1 segment diffusely irregular, likely reflecting atherosclerotic disease. There is a superimposed short-segment moderate approximate 50% stenosis at the proximal left M1 segment (series 9, image 98). Left M1 otherwise patent to the bifurcation. Short-segment moderate to severe proximal left M2 stenoses involving both the superior and inferior divisions. Extensive atheromatous irregularity distally within the left MCA branches. POSTERIOR CIRCULATION: Right vertebral artery dominant, with a diffusely hypoplastic left vertebral artery. Atheromatous irregularity throughout the V4 segments compatible with atherosclerotic change. No significant stenosis on the right. There are severe tandem stenoses involving the distal left V4 segment (series 1037, image 6). Posterior inferior cerebral arteries not seen. Diffuse atheromatous irregularity throughout the basilar artery without high-grade stenosis. Superior cerebral arteries patent bilaterally. Both of the posterior cerebral arteries primarily supplied via the basilar. Extensive atheromatous change throughout the PCAs bilaterally without high-grade stenosis. Overall, appearance of the intracranial circulation likely similar as compared to previous arteriogram. IMPRESSION: MRI HEAD IMPRESSION: 1. Patchy multifocal  acute to early subacute left MCA territory infarcts involving the left frontal lobe and basal ganglia. Associated faint petechial hemorrhage without frank hemorrhagic transformation. No significant mass effect. 2. Chronic left parieto-occipital infarct 3. Underlying moderately advanced cerebral atrophy with chronic small vessel ischemic disease. 4. Right fronto ethmoidal sinusitis. MRA HEAD IMPRESSION: 1. Negative intracranial MRA for large vessel occlusion. 2. Extensive atheromatous disease throughout the intracranial circulation, with notable findings including moderate  tandem cavernous/supraclinoid left ICA stenoses, moderate bilateral M1 stenoses, moderate to severe proximal left M2 branch stenoses, and severe tandem left V4 stenoses. Overall, appearance of the intracranial circulation likely relatively similar as compared to previous arteriogram from 2018. Electronically Signed   By: Jeannine Boga M.D.   On: 12/18/2018 03:27   Vas US Carotid (at [place] Only)  Result Date: 12/19/2018 Carotid Arterial Duplex Study Indications:   CVA and Speech disturbance. Risk Factors:  Hypertension, prior CVA. Other Factors: Non compliant with medications.  Performing Technologist: Toma Copier RVS  Examination Guidelines: A complete evaluation includes B-mode imaging, spectral Doppler, color Doppler, and power Doppler as needed of all accessible portions of each vessel. Bilateral testing is considered an integral part of a complete examination. Limited examinations for reoccurring indications may be performed as noted.  Right Carotid Findings: +----------+--------+--------+--------+------------+--------------------+           PSV cm/sEDV cm/sStenosisDescribe    Comments             +----------+--------+--------+--------+------------+--------------------+ CCA Prox  75      4                           mild intimal changes +----------+--------+--------+--------+------------+--------------------+  CCA Distal63      9                           mild intimal changes +----------+--------+--------+--------+------------+--------------------+ ICA Prox  32      7               heterogenousmild plaque          +----------+--------+--------+--------+------------+--------------------+ ICA Mid   67      14                          tortuous             +----------+--------+--------+--------+------------+--------------------+ ICA Distal76      16                          tortuous             +----------+--------+--------+--------+------------+--------------------+ ECA       112     12              heterogenousmild plaque          +----------+--------+--------+--------+------------+--------------------+ +----------+--------+-------+--------+-------------------+           PSV cm/sEDV cmsDescribeArm Pressure (mmHG) +----------+--------+-------+--------+-------------------+ [id number]                                         +----------+--------+-------+--------+-------------------+ +---------+--------+--+--------+-+ VertebralPSV cm/s46EDV cm/s8 +---------+--------+--+--------+-+ 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  Left Carotid Findings: +----------+--------+--------+--------+------------+---------------------------+           PSV cm/sEDV cm/sStenosisDescribe    Comments                    +----------+--------+--------+--------+------------+---------------------------+ CCA Prox  84      3                           mild intimal changes        +----------+--------+--------+--------+------------+---------------------------+ CCA Distal61      9  mild intimal changes        +----------+--------+--------+--------+------------+---------------------------+ ICA Prox  50      10              heterogenousmild plaque                  +----------+--------+--------+--------+------------+---------------------------+ ICA Mid   86      19              heterogenousNot visualized              +----------+--------+--------+--------+------------+---------------------------+ ICA Distal                                    unable to visualize the                                                   most distal ICA due to high                                               bifurcation]                +----------+--------+--------+--------+------------+---------------------------+ ECA       36      5               heterogenousmild plaque                 +----------+--------+--------+--------+------------+---------------------------+ +----------+--------+--------+--------+-------------------+ SubclavianPSV cm/sEDV cm/sDescribeArm Pressure (mmHG) +----------+--------+--------+--------+-------------------+           151                                         +----------+--------+--------+--------+-------------------+ +---------+--------+--+--------+-+ VertebralPSV cm/s34EDV cm/s6 +---------+--------+--+--------+-+ Technically difficult due to 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  Summary: Right Carotid: Velocities in the right ICA are consistent with a 1-39% stenosis.                See technician notes listed above. Left Carotid: Velocities in the left ICA are consistent with a 1-39% stenosis.               See technician notes listed above. Vertebrals:  Bilateral vertebral arteries demonstrate antegrade flow. Subclavians: Normal flow hemodynamics were seen in bilateral subclavian              arteries. *See table(s) above for measurements and observations.  Electronically signed by Antony Contras MD on 12/19/2018 at 2:05:41 PM.    Final     Scheduled Meds: . aspirin EC  81 mg Oral Daily  . atorvastatin  40 mg Oral q1800  . clopidogrel  75 mg Oral Daily  . enoxaparin (LOVENOX) injection  40 mg  Subcutaneous Daily  . losartan  100 mg Oral Daily   Continuous Infusions: . sodium chloride 100 mL/hr at 12/18/18 2257     LOS: 2 days   Time spent: 25 minutes.  Patrecia Pour, MD [facility] [email or web address] Password [email or web address] 12/19/2018, 2:45 PM

## 2018-12-19 NOTE — Progress Notes (Signed)
Occupational Therapy Treatment Patient Details Name: Ronald Wolf MRN: [id number] DOB: 1946/05/09 Today's Date: 12/19/2018    History of present illness Ronald Wolf is a 73 y.o. male with medical history significant of previous CVA, hypertension, recurrent UTIs, previous alcohol abuse, GERD, history of bladder cancer and medication noncompliance , brought in with AMS and imaging now showing L MCA infarct   OT comments  Pt progressing towards OT goals, presents sitting up in recliner agreeable to therapy session. Pt completing functional mobility in room and short distance into hallway using RW with minA this session; completing standing grooming ADL at sink with minA for static balance. Pt requires increased time to perform functional tasks and cues for safe RW use as well as to avoid obstacles in R visual field during mobility. He continues to demonstrate cognitive impairments requiring increased time for processing and mod multimodal cues to follow one step commands. Feel SNF recommendation remains appropriate at this time to maximize pt's safety and independence with ADLs and mobility prior to return home. Will continue to follow acutely to progress pt towards established OT goals.   Follow Up Recommendations  SNF;Supervision/Assistance - 24 hour    Equipment Recommendations  None recommended by OT          Precautions / Restrictions Precautions Precautions: Fall Restrictions Weight Bearing Restrictions: No       Mobility Bed Mobility               General bed mobility comments: OOB in recliner upon arrival  Transfers Overall transfer level: Needs assistance   Transfers: Sit to/from Stand Sit to Stand: Min assist         General transfer comment: cuing for hand placement with assist to rise and steady at RW; increased time and cues for placing RUE onto RW    Balance Overall balance assessment: Needs assistance Sitting-balance support: Feet supported Sitting  balance-Leahy Scale: Fair     Standing balance support: Bilateral upper extremity supported;During functional activity;No upper extremity supported Standing balance-Leahy Scale: Poor                             ADL either performed or assessed with clinical judgement   ADL Overall ADL's : Needs assistance/impaired Eating/Feeding: Set up;Sitting Eating/Feeding Details (indicate cue type and reason): setup assist to open containers, pt required cues to correctly identify items as he initially attempts to use spoon for cup of water (family member stating he most likely thinks it is icecream); had to adjust tray towards L side of table as pt with difficulty locating items towards R side Grooming: Wash/dry face;Minimal assistance;Standing Grooming Details (indicate cue type and reason): minA standing balance; pt performing task appropriately given cues to initiate                              Functional mobility during ADLs: Minimal assistance;Rolling walker;Cueing for sequencing;Cueing for safety General ADL Comments: pt performing standing grooming ADL and short distance into hallway using RW, requires cues to avoid obstacles on R side     Vision   Additional Comments: pt with noted R peripheral deficits; requires cues and assist to avoid obstacles on R side   Perception     Praxis      Cognition Arousal/Alertness: Awake/alert Behavior During Therapy: WFL for tasks assessed/performed Overall Cognitive Status: Impaired/Different from baseline Area of Impairment: Orientation;Attention;Memory;Following commands;Safety/judgement;Awareness;Problem  solving                 Orientation Level: Time;Situation Current Attention Level: Sustained Memory: Decreased short-term memory Following Commands: Follows one step commands inconsistently;Follows one step commands with increased time Safety/Judgement: Decreased awareness of safety Awareness:  Intellectual Problem Solving: Slow processing;Difficulty sequencing;Decreased initiation;Requires verbal cues;Requires tactile cues General Comments: pt requires multimodal cues to follow simple commands this session, increased processing time; states the year is 1989 then [place]; able to identify 3 friend/family members present in room given increased time to do so        Exercises     Shoulder Instructions       General Comments pt's family present and supportive during session    Pertinent Vitals/ Pain       Pain Assessment: No/denies pain  Home Living                                          Prior Functioning/Environment              Frequency  Min 2X/week        Progress Toward Goals  OT Goals(current goals can now be found in the care plan section)  Progress towards OT goals: Progressing toward goals  Acute Rehab OT Goals Patient Stated Goal: none stated, per family to return to PLOF OT Goal Formulation: With patient/family Time For Goal Achievement: 01/01/19 Potential to Achieve Goals: Good  Plan Discharge plan remains appropriate    Co-evaluation                 AM-PAC OT "6 Clicks" Daily Activity     Outcome Measure   Help from another person eating meals?: A Little Help from another person taking care of personal grooming?: A Little Help from another person toileting, which includes using toliet, bedpan, or urinal?: A Little Help from another person bathing (including washing, rinsing, drying)?: A Little Help from another person to put on and taking off regular upper body clothing?: A Little Help from another person to put on and taking off regular lower body clothing?: A Lot 6 Click Score: 17    End of Session Equipment Utilized During Treatment: Gait belt;Rolling walker  OT Visit Diagnosis: Unsteadiness on feet (R26.81);Muscle weakness (generalized) (M62.81)   Activity Tolerance Patient tolerated treatment well    Patient Left in chair;with call bell/phone within reach;with chair alarm set   Nurse Communication Mobility status        Time: 6270-3500 OT Time Calculation (min): 25 min  Charges: OT General Charges $OT Visit: 1 Visit OT Treatments $Self Care/Home Management : 23-37 mins  Ronald Wolf, [place] Pager [phone] Office [phone]    Raymondo Band 12/19/2018, 1:01 PM

## 2018-12-19 NOTE — Progress Notes (Addendum)
STROKE TEAM PROGRESS NOTE   INTERVAL HISTORY His friend (girl) is at the bedside.  She is upset given his confused state. Has prior history of stroke and states he was admitted to the hospital   Vitals:   12/18/18 1613 12/18/18 1953 12/18/18 2331 12/19/18 0338  BP: (!) 168/79 (!) 172/76 (!) 173/86 (!) 176/84  Pulse: 61 66 (!) 59 (!) 53  Resp: 18 (!) 21 (!) 21 20  Temp: 99.1 F (37.3 C) 98.7 F (37.1 C) 98.6 F (37 C) 98.4 F (36.9 C)  TempSrc: Oral Oral Oral Oral  SpO2: 96% 100% 100% 99%  Weight:      Height:        CBC:  Recent Labs  Lab 12/17/18 2032 12/18/18 0049 12/18/18 0322  WBC 8.5 8.1 7.8  NEUTROABS 4.6  --   --   HGB 14.5 13.7 13.8  HCT 45.0 43.0 44.6  MCV 111.1* 109.4* 110.4*  PLT 267 239 694    Basic Metabolic Panel:  Recent Labs  Lab 12/17/18 2032 12/18/18 0049 12/18/18 0322  NA 141  --  139  K 3.9  --  3.7  CL 106  --  106  CO2 24  --  24  GLUCOSE 98  --  101*  BUN 18  --  15  CREATININE 0.98 0.98 0.81  CALCIUM 9.8  --  9.3   Lipid Panel:     Component Value Date/Time   CHOL 178 12/18/2018 0322   TRIG 119 12/18/2018 0322   HDL 41 12/18/2018 0322   CHOLHDL 4.3 12/18/2018 0322   VLDL 24 12/18/2018 0322   LDLCALC 113 (H) 12/18/2018 0322   HgbA1c:  Lab Results  Component Value Date   HGBA1C 4.2 (L) 12/18/2018   Urine Drug Screen:     Component Value Date/Time   LABOPIA NONE DETECTED 12/17/2018 2159   COCAINSCRNUR NONE DETECTED 12/17/2018 2159   LABBENZ NONE DETECTED 12/17/2018 2159   AMPHETMU NONE DETECTED 12/17/2018 2159   THCU NONE DETECTED 12/17/2018 2159   LABBARB NONE DETECTED 12/17/2018 2159    Alcohol Level     Component Value Date/Time   Total [facility] <10 12/17/2018 2032    IMAGING Dg Chest 1 View  Result Date: 12/18/2018 CLINICAL DATA:  Initial evaluation for MRI clearance. EXAM: CHEST  1 VIEW COMPARISON:  Prior radiograph from 06/21/2014. FINDINGS: Cardiomegaly. Mediastinal silhouette within normal limits. Aortic  atherosclerosis. Lungs mildly hypoinflated. Mild diffuse interstitial congestion without overt pulmonary edema. No pleural effusion. No consolidative opacity. No pneumothorax. No acute osseous abnormality. No radiopaque hardware or other finding to prevent MRI exam. IMPRESSION: 1. Patient clear for MRI.  No radiopaque hardware identified. 2. Cardiomegaly with mild diffuse pulmonary interstitial congestion without overt pulmonary edema. 3. Aortic atherosclerosis. Electronically Signed   By: Jeannine Boga M.D.   On: 12/18/2018 01:56   Dg Abdomen 1 View  Result Date: 12/18/2018 CLINICAL DATA:  Initial evaluation for MRI screening clearance. EXAM: ABDOMEN - 1 VIEW COMPARISON:  Prior CT from 04/06/2013 FINDINGS: Bowel gas pattern within normal limits without obstruction or ileus. No abnormal bowel wall thickening. No soft tissue mass or abnormal calcification. Multiple surgical clips seen overlying the pelvis, stable from prior CT. Anastomotic suture noted within the right abdomen. No other radiopaque density or findings to prevent MRI exam. Extensive degenerative changes noted within the visualized spine and about the hips bilaterally. IMPRESSION: 1. Patient cleared for MRI. Multiple surgical clips overlying the pelvis with anastomotic suture within the right abdomen  felt to be safe for MRI, and are stable relative to prior CT from 2013. 2. Nonobstructive bowel gas pattern Electronically Signed   By: Jeannine Boga M.D.   On: 12/18/2018 01:54   Ct Head Wo Contrast  Result Date: 12/17/2018 CLINICAL DATA:  Nonverbal since awakening from a nap this afternoon, history hypertension, smoker EXAM: CT HEAD WITHOUT CONTRAST TECHNIQUE: Contiguous axial images were obtained from the base of the skull through the vertex without intravenous contrast. Sagittal and coronal MPR images reconstructed from axial data set. COMPARISON:  08/24/2017 FINDINGS: Brain: Generalized atrophy. Normal ventricular morphology. No  midline shift or mass effect. Further evolution of LEFT posterior parietal watershed infarct seen previously. Small vessel chronic ischemic changes of deep cerebral white matter. No intracranial hemorrhage, mass lesion, or evidence of acute infarction. No extra-axial fluid collections. Vascular: Atherosclerotic calcifications of internal carotid arteries at skull base Skull: Intact Sinuses/Orbits: Opacified RIGHT frontal sinus with air-fluid level. Remaining paranasal sinuses and mastoid air cells clear. Other: N/A IMPRESSION: Atrophy with small vessel chronic ischemic changes of deep cerebral white matter. Further evolution of previously identified LEFT posterior parietal watershed infarct. No definite new intracranial abnormalities. Question RIGHT frontal sinusitis. Electronically Signed   By: Lavonia Dana M.D.   On: 12/17/2018 21:00   Mr Brain Wo Contrast  Result Date: 12/18/2018 CLINICAL DATA:  Initial evaluation for acute TIA. EXAM: MRI HEAD WITHOUT CONTRAST MRA HEAD WITHOUT CONTRAST TECHNIQUE: Multiplanar, multiecho pulse sequences of the brain and surrounding structures were obtained without intravenous contrast. Angiographic images of the head were obtained using MRA technique without contrast. COMPARISON:  Prior CT from 12/17/2017 as well as previous MRI from 08/08/2017 and arteriogram from 09/09/2017. FINDINGS: MRI HEAD FINDINGS Brain: Examination mildly degraded by motion artifact. Moderately advanced cerebral atrophy with chronic small vessel ischemic disease. Encephalomalacia with gliosis within the left parieto-occipital region compatible with remote left posterior MCA territory infarcts and/or left MCA/PCA watershed infarcts. Associated chronic hemosiderin staining and/or laminar necrosis within this region. Intrinsic diffusion abnormality related to susceptibility artifact noted about this chronic infarct. Patchy multifocal areas of acute to early subacute ischemic infarcts seen involving the  cortical gray matter and underlying hemispheric white matter of the anterior and mid left frontal lobe, left MCA territory (series 5, image 90, 80 for). Associated patchy involvement within the underlying caudate and left lentiform nuclei. Associated minimal faint petechial hemorrhage without hemorrhagic transformation (series 20, image 44). No other evidence for acute or subacute ischemia. Gray-white matter differentiation otherwise maintained. No other evidence for acute or chronic intracranial hemorrhage. No mass lesion, midline shift or mass effect. Ventricular prominence related global parenchymal volume loss without hydrocephalus. No extra-axial fluid collection. Vascular: Major intracranial vascular flow voids maintained at the skull base. Skull and upper cervical spine: Prominent degenerative changes noted about the C1-2 articulation. Craniocervical junction otherwise unremarkable. Bone marrow signal intensity within normal limits. No scalp soft tissue abnormality. Sinuses/Orbits: Globes and orbital soft tissues within normal limits. Right fronto ethmoidal sinusitis noted. Small bilateral mastoid effusions, of doubtful significance. Inner ear structures grossly normal. Other: None. MRA HEAD FINDINGS ANTERIOR CIRCULATION: Visualized distal cervical segments of the internal carotid arteries are patent with symmetric antegrade flow. Petrous, cavernous, and supraclinoid right ICA patent to the terminus without stenosis. Petrous left ICA widely patent. Short-segment moderate approximately 50% stenosis at the left petrous-cavernous junction. Tandem moderate stenoses involving the cavernous and supraclinoid left ICA of up to approximately 50%. Left ICA terminus well perfused. Right A1 widely patent. Short-segment moderate  stenosis within the mid left A1 segment (series 1028, image 9) anterior communicating artery not well assessed on this exam. Anterior cerebral arteries demonstrates mild atheromatous irregularity  but are patent to their distal aspects without flow-limiting stenosis. Short-segment moderate approximate 50-70% stenosis at the mid right M1 segment (series 1028, image 8). Right MCA otherwise patent to the bifurcation. Extensive small vessel atheromatous irregularity seen throughout the right MCA branches. Left M1 segment diffusely irregular, likely reflecting atherosclerotic disease. There is a superimposed short-segment moderate approximate 50% stenosis at the proximal left M1 segment (series 9, image 98). Left M1 otherwise patent to the bifurcation. Short-segment moderate to severe proximal left M2 stenoses involving both the superior and inferior divisions. Extensive atheromatous irregularity distally within the left MCA branches. POSTERIOR CIRCULATION: Right vertebral artery dominant, with a diffusely hypoplastic left vertebral artery. Atheromatous irregularity throughout the V4 segments compatible with atherosclerotic change. No significant stenosis on the right. There are severe tandem stenoses involving the distal left V4 segment (series 1037, image 6). Posterior inferior cerebral arteries not seen. Diffuse atheromatous irregularity throughout the basilar artery without high-grade stenosis. Superior cerebral arteries patent bilaterally. Both of the posterior cerebral arteries primarily supplied via the basilar. Extensive atheromatous change throughout the PCAs bilaterally without high-grade stenosis. Overall, appearance of the intracranial circulation likely similar as compared to previous arteriogram. IMPRESSION: MRI HEAD IMPRESSION: 1. Patchy multifocal acute to early subacute left MCA territory infarcts involving the left frontal lobe and basal ganglia. Associated faint petechial hemorrhage without frank hemorrhagic transformation. No significant mass effect. 2. Chronic left parieto-occipital infarct 3. Underlying moderately advanced cerebral atrophy with chronic small vessel ischemic disease. 4. Right  fronto ethmoidal sinusitis. MRA HEAD IMPRESSION: 1. Negative intracranial MRA for large vessel occlusion. 2. Extensive atheromatous disease throughout the intracranial circulation, with notable findings including moderate tandem cavernous/supraclinoid left ICA stenoses, moderate bilateral M1 stenoses, moderate to severe proximal left M2 branch stenoses, and severe tandem left V4 stenoses. Overall, appearance of the intracranial circulation likely relatively similar as compared to previous arteriogram from 2018. Electronically Signed   By: Jeannine Boga M.D.   On: 12/18/2018 03:27   Mr Jodene Nam Head Wo Contrast  Result Date: 12/18/2018 CLINICAL DATA:  Initial evaluation for acute TIA. EXAM: MRI HEAD WITHOUT CONTRAST MRA HEAD WITHOUT CONTRAST TECHNIQUE: Multiplanar, multiecho pulse sequences of the brain and surrounding structures were obtained without intravenous contrast. Angiographic images of the head were obtained using MRA technique without contrast. COMPARISON:  Prior CT from 12/17/2017 as well as previous MRI from 08/08/2017 and arteriogram from 09/09/2017. FINDINGS: MRI HEAD FINDINGS Brain: Examination mildly degraded by motion artifact. Moderately advanced cerebral atrophy with chronic small vessel ischemic disease. Encephalomalacia with gliosis within the left parieto-occipital region compatible with remote left posterior MCA territory infarcts and/or left MCA/PCA watershed infarcts. Associated chronic hemosiderin staining and/or laminar necrosis within this region. Intrinsic diffusion abnormality related to susceptibility artifact noted about this chronic infarct. Patchy multifocal areas of acute to early subacute ischemic infarcts seen involving the cortical gray matter and underlying hemispheric white matter of the anterior and mid left frontal lobe, left MCA territory (series 5, image 90, 80 for). Associated patchy involvement within the underlying caudate and left lentiform nuclei. Associated  minimal faint petechial hemorrhage without hemorrhagic transformation (series 20, image 44). No other evidence for acute or subacute ischemia. Gray-white matter differentiation otherwise maintained. No other evidence for acute or chronic intracranial hemorrhage. No mass lesion, midline shift or mass effect. Ventricular prominence related global parenchymal volume loss  without hydrocephalus. No extra-axial fluid collection. Vascular: Major intracranial vascular flow voids maintained at the skull base. Skull and upper cervical spine: Prominent degenerative changes noted about the C1-2 articulation. Craniocervical junction otherwise unremarkable. Bone marrow signal intensity within normal limits. No scalp soft tissue abnormality. Sinuses/Orbits: Globes and orbital soft tissues within normal limits. Right fronto ethmoidal sinusitis noted. Small bilateral mastoid effusions, of doubtful significance. Inner ear structures grossly normal. Other: None. MRA HEAD FINDINGS ANTERIOR CIRCULATION: Visualized distal cervical segments of the internal carotid arteries are patent with symmetric antegrade flow. Petrous, cavernous, and supraclinoid right ICA patent to the terminus without stenosis. Petrous left ICA widely patent. Short-segment moderate approximately 50% stenosis at the left petrous-cavernous junction. Tandem moderate stenoses involving the cavernous and supraclinoid left ICA of up to approximately 50%. Left ICA terminus well perfused. Right A1 widely patent. Short-segment moderate stenosis within the mid left A1 segment (series 1028, image 9) anterior communicating artery not well assessed on this exam. Anterior cerebral arteries demonstrates mild atheromatous irregularity but are patent to their distal aspects without flow-limiting stenosis. Short-segment moderate approximate 50-70% stenosis at the mid right M1 segment (series 1028, image 8). Right MCA otherwise patent to the bifurcation. Extensive small vessel  atheromatous irregularity seen throughout the right MCA branches. Left M1 segment diffusely irregular, likely reflecting atherosclerotic disease. There is a superimposed short-segment moderate approximate 50% stenosis at the proximal left M1 segment (series 9, image 98). Left M1 otherwise patent to the bifurcation. Short-segment moderate to severe proximal left M2 stenoses involving both the superior and inferior divisions. Extensive atheromatous irregularity distally within the left MCA branches. POSTERIOR CIRCULATION: Right vertebral artery dominant, with a diffusely hypoplastic left vertebral artery. Atheromatous irregularity throughout the V4 segments compatible with atherosclerotic change. No significant stenosis on the right. There are severe tandem stenoses involving the distal left V4 segment (series 1037, image 6). Posterior inferior cerebral arteries not seen. Diffuse atheromatous irregularity throughout the basilar artery without high-grade stenosis. Superior cerebral arteries patent bilaterally. Both of the posterior cerebral arteries primarily supplied via the basilar. Extensive atheromatous change throughout the PCAs bilaterally without high-grade stenosis. Overall, appearance of the intracranial circulation likely similar as compared to previous arteriogram. IMPRESSION: MRI HEAD IMPRESSION: 1. Patchy multifocal acute to early subacute left MCA territory infarcts involving the left frontal lobe and basal ganglia. Associated faint petechial hemorrhage without frank hemorrhagic transformation. No significant mass effect. 2. Chronic left parieto-occipital infarct 3. Underlying moderately advanced cerebral atrophy with chronic small vessel ischemic disease. 4. Right fronto ethmoidal sinusitis. MRA HEAD IMPRESSION: 1. Negative intracranial MRA for large vessel occlusion. 2. Extensive atheromatous disease throughout the intracranial circulation, with notable findings including moderate tandem  cavernous/supraclinoid left ICA stenoses, moderate bilateral M1 stenoses, moderate to severe proximal left M2 branch stenoses, and severe tandem left V4 stenoses. Overall, appearance of the intracranial circulation likely relatively similar as compared to previous arteriogram from 2018. Electronically Signed   By: Jeannine Boga M.D.   On: 12/18/2018 03:27   Vas US Carotid (at [place] Only)  Result Date: 12/18/2018 Carotid Arterial Duplex Study Indications:   CVA and Speech disturbance. Risk Factors:  Hypertension, prior CVA. Other Factors: Non compliant with medications.  Performing Technologist: Toma Copier RVS  Examination Guidelines: A complete evaluation includes B-mode imaging, spectral Doppler, color Doppler, and power Doppler as needed of all accessible portions of each vessel. Bilateral testing is considered an integral part of a complete examination. Limited examinations for reoccurring indications may be performed as noted.  Right Carotid Findings: +----------+--------+--------+--------+------------+--------------------+           PSV cm/sEDV cm/sStenosisDescribe    Comments             +----------+--------+--------+--------+------------+--------------------+ CCA Prox  75      4                           mild intimal changes +----------+--------+--------+--------+------------+--------------------+ CCA Distal63      9                           mild intimal changes +----------+--------+--------+--------+------------+--------------------+ ICA Prox  32      7               heterogenousmild plaque          +----------+--------+--------+--------+------------+--------------------+ ICA Mid   67      14                          tortuous             +----------+--------+--------+--------+------------+--------------------+ ICA Distal76      16                          tortuous              +----------+--------+--------+--------+------------+--------------------+ ECA       112     12              heterogenousmild plaque          +----------+--------+--------+--------+------------+--------------------+ +----------+--------+-------+--------+-------------------+           PSV cm/sEDV cmsDescribeArm Pressure (mmHG) +----------+--------+-------+--------+-------------------+ [id number]                                         +----------+--------+-------+--------+-------------------+ +---------+--------+--+--------+-+ VertebralPSV cm/s46EDV cm/s8 +---------+--------+--+--------+-+ 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  Left Carotid Findings: +----------+--------+--------+--------+------------+---------------------------+           PSV cm/sEDV cm/sStenosisDescribe    Comments                    +----------+--------+--------+--------+------------+---------------------------+ CCA Prox  84      3                           mild intimal changes        +----------+--------+--------+--------+------------+---------------------------+ CCA Distal61      9                           mild intimal changes        +----------+--------+--------+--------+------------+---------------------------+ ICA Prox  50      10              heterogenousmild plaque                 +----------+--------+--------+--------+------------+---------------------------+ ICA Mid   86      19              heterogenousNot visualized              +----------+--------+--------+--------+------------+---------------------------+ ICA Distal  unable to visualize the                                                   most distal ICA due to high                                               bifurcation]                +----------+--------+--------+--------+------------+---------------------------+ ECA       36       5               heterogenousmild plaque                 +----------+--------+--------+--------+------------+---------------------------+ +----------+--------+--------+--------+-------------------+ SubclavianPSV cm/sEDV cm/sDescribeArm Pressure (mmHG) +----------+--------+--------+--------+-------------------+           151                                         +----------+--------+--------+--------+-------------------+ +---------+--------+--+--------+-+ VertebralPSV cm/s34EDV cm/s6 +---------+--------+--+--------+-+ Technically difficult due to 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  Summary: Right Carotid: Velocities in the right ICA are consistent with a 1-39% stenosis.                See technician notes listed above. Left Carotid: Velocities in the left ICA are consistent with a 1-39% stenosis.               See technician notes listed above. Vertebrals:  Bilateral vertebral arteries demonstrate antegrade flow. Subclavians: Normal flow hemodynamics were seen in bilateral subclavian              arteries. *See table(s) above for measurements and observations.     Preliminary    2D Echocardiogram  - Left ventricle: The cavity size was normal. Wall thickness was normal. Systolic function was normal. The estimated ejection fraction was in the range of 60% to 65%. Wall motion was normal; there were no regional wall motion abnormalities. Doppler parameters are consistent with abnormal left ventricular relaxation (grade 1 diastolic dysfunction). - Mitral valve: Calcified annulus. - Right atrium: The atrium was mildly dilated. Impressions: - No cardiac source of emboli was indentified.   PHYSICAL EXAM Pleasant elderly African-American male currently not in distress. . Afebrile. Head is nontraumatic. Neck is supple without bruit.    Cardiac exam no murmur or gallop. Lungs are clear to auscultation. Distal pulses are well felt. Neurological Exam :  Awake alert nonfluent speech  with word hesitancy and only short sentences. Follows only simple midline and one-step commands. No paraphasic errors. Difficulty with naming and petition. Blinks to threat on the left but not on the right side. Mild right lower facial weakness. Tongue midline. Motor system exam reveals symmetric upper and lower extremity strength but some poor effort by testing right grip. Feels subjective diminished sensation on the right hemibody. Deep tendon results in symmetric. Plantars are downgoing. Gait not tested.  ASSESSMENT/PLAN Ronald Wolf is a 73 y.o. male with history of trigeminal neuralgia of the left V2 distribution ,previous CVA, hypertension, recurrent UTIs, alcohol abuse and medication noncompliance presenting with altered mental status,  aphasia, uncontrolled HTN.   Stroke:  Patchy left MCA infarcts in setting of severe large vessel disease  CT head 1/5 2100 evolution L post parietal watershed infarct. No new. ? R frontal sinusitis. Small vessel disease. Atrophy.  MRI  Patchy L MCA infarcts w/ faint petechial hmg. Old L PO infarct. Small vessel disease. Atrophy. R fronto ethmoidal sinusitis.  MRA  Neg LVO. Extensive atherosclerotic disease, similar to 2018  Carotid Doppler  B ICA 1-39% stenosis, VAs antegrade   2D Echo  EF 60-65%. No source of embolus   LDL 113  HgbA1c 4.2  Lovenox 40 mg sq daily for VTE prophylaxis  No antithrombotic (pt non-compliant with aspirin 81 mg that was ordered daily prior to admission), now on aspirin 81 mg daily and clopidogrel 75 mg daily following plavix load. Continue DAPT x 3 months the aspirin alone  Therapy recommendations:  SNF  Disposition:  pending   Hypertension  Stable  Non-compliant with home meds . Permissive hypertension (OK if < 220/120) but gradually normalize in 5-7 days . Long-term BP goal normotensive  Hyperlipidemia  Home meds:  No statin  Now on lipitor 40  LDL 113, goal < 70  Continue statin at  discharge  Other Stroke Risk Factors  Advanced age  Cigarette smoker, advised to stop smoking  ETOH use, advised to drink no more than 2 drink(s) a day  Hx stroke/TIA  07/2017 MRI results acute on subacute L parietal occipital infarct, watershed vs MCA. Old sm L frontal love infarct new since 2013. No associated hospitalization.   Other Active Problems  Medical non-complaince  Hx bladder cancer  Anemia of chronic disease  UTI, empiric tx, Cx pending   trigeminal neuralgia s/p gamma knife, followed by Dr. Jannifer Franklin  NOTHING FURTHER TO ADD FROM THE STROKE STANDPOINT  Patient has a 10-15% risk of having another stroke over the next year, the highest risk is within 2 weeks of the most recent stroke/TIA (risk of having a stroke following a stroke or TIA is the same).  Ongoing risk factor control by Primary Care Physician  Stroke Service will sign off. Please call should any needs arise.  Follow-up Stroke Clinic at [facility] in 4 weeks, order placed.   Hospital day # 2  Burnetta Sabin, MSN, APRN, ANVP-BC, AGPCNP-BC Advanced Practice Stroke Nurse [place] for Schedule & Pager information 12/19/2018 3:17 PM  I have personally obtained history,examined this patient, reviewed notes, independently viewed imaging studies, participated in medical decision making and plan of care.ROS completed by me personally and pertinent positives fully documented  I have made any additions or clarifications directly to the above note. Agree with note above. 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 Recommend dual antiplatelet therapy for 3 months along with aggressive risk factor modification. Continue ongoing therapies. Follow-up as an outpatient in stroke clinic in 6 weeks. Greater than 50% time during this 25 minute visit was spent on  counseling and coordination of care about his stroke and answered questions. Discussed with patient and girlfriend and Dr. Genella Mech, [place] Pager: [phone] 12/19/2018 4:49 PM  To contact Stroke Continuity provider, please refer to [email or web address] After hours, contact General Neurology

## 2018-12-20 MED ORDER — METOPROLOL TARTRATE 12.5 MG HALF TABLET
12.5000 mg | ORAL_TABLET | Freq: Two times a day (BID) | ORAL | Status: DC
  Administered 2018-12-20 – 2018-12-24 (×9): 12.5 mg via ORAL
  Filled 2018-12-20 (×9): qty 1

## 2018-12-20 NOTE — Consult Note (Addendum)
[facility] Nurse ostomy consult note Stoma type/location: RLQ urostomy Stomal assessment/size: Measured through pouch, approximately 1 and 1/4 inches, red, round, moist and budded. OS at center. Peristomal assessment: Unremarkable Treatment options for stomal/peristomal skin: I am applying a convex pouch today, but return to a flat system would suffice upon discharge. Output: amber clear urine Ostomy pouching: wearing a 2-piece [employer]va[employer] pouching system with no adapter for bedside drainage bag.  Taught that we do not carry ConvaTec, but that he could switch back to them upon discharge or use those from home. I have ordered [employer] brand pouches and adapters this morning and a spare pouching system is in the room.  Education provided: See above Enrolled patient in [facility] program: No. Patient is established with a supply provider.  [facility] nursing team will not follow, but will remain available to this patient, the nursing and medical teams.  Please re-consult if needed. Thanks, Maudie Flakes, MSN, RN, [place], Arther Abbott  Pager# [phone]

## 2018-12-20 NOTE — Progress Notes (Signed)
PROGRESS NOTE  Ronald Wolf  JEH:[id number] DOB: 02-04-1946 DOA: 12/17/2018 PCP: Mosie Lukes, MD   Brief Narrative: Ronald Wolf is a 73 y.o. male with a history of prior CVA, HTN, tobacco use, recurrent UTI's, alcohol abuse in remission, GERD, history of bladder CA and medical nonadherence who presented to the ED with progressive AMS and aphasia. Found to be hypertensive, having not taken BP medications regularly. Initial concern was for UTI, though MRI demonstrated acute to subacute watershed left MCA infarcts. CT angiography revealed multivessel atherosclerotic disease with mod-severe left ICA stenosis and severe M2 stenosis. High-dose statin and dual antiplatelet therapy were initiated and therapy consulted. Current recommendation is for acute rehabilitation at SNF, CSW consulted, FL2 signed.  Assessment & Plan: Principal Problem:   CVA (cerebral vascular accident) ([place]) Active Problems:   HTN (hypertension)   Anemia   Smoking   UTI (urinary tract infection)   History of bladder cancer  Acute MCA territory CVA's on chronic CVD: With resultant expressive aphasia. No CES on echocardiogram. Carotid U/S with bilateral 1-39% stenosis with high level of bifurcation. HbA1c 4.2%. - Discussed with neurology, Dr. Leonie Man. Due to atherosclerotic burden, pt was loaded with plavix 300mg  followed by [facility] with plans to continue aspirin indefinitely and plavix for 3 months. - With significant deficits, PT, OT recommend SNF at discharge. CSW consulted. - Started high intensity statin (LDL 113)  HTN with hypertensive urgency:  - Moving out of permissive HTN window. Continue ARB at high dose.  History of bladder CA s/p urostomy:  - Oncology follow up recommended - WOC consulted for management/supplies.   SVT: Short runs noted on telemetry.  - Start low dose metoprolol and continue monitoring, consider cardiac monitoring as outpatient, though stroke was not consistent with embolism.  Anemia of  chronic disease:  - Monitor intermittently. - Macrocytosis noted, though B12 and folate are wnl.  Tobacco use:  - Cessation counseling provided.   Pyuria: Urine culture nonclonal. This is not likely a true infection. No leukocytosis or fever.   DVT prophylaxis: Lovenox Code Status: Full Family Communication: None at bedside this AM. Disposition Plan: Medically stable for discharge to SNF. CSW working diligently.  Consultants:   Neurology, stroke team  Procedures:   None  Antimicrobials:  None  Subjective: Has not improved to prior level of functioning but eager to work with therapy at [facility]. No palpitations, chest pain, dyspnea.   Objective: Vitals:   12/19/18 2010 12/20/18 0004 12/20/18 0411 12/20/18 0745  BP: (!) 144/67 (!) 161/73 131/70 (!) 141/73  Pulse: 65 64 (!) 59 (!) 57  Resp: 18 17 17 18   Temp: 98.7 F (37.1 C) 99 F (37.2 C) 98.5 F (36.9 C) 98.6 F (37 C)  TempSrc: Oral Oral Oral Oral  SpO2: 100% 97% 97% 98%  Weight:      Height:        Intake/Output Summary (Last 24 hours) at 12/20/2018 1442 Last data filed at 12/20/2018 1200 Gross per 24 hour  Intake 360 ml  Output -  Net 360 ml   Filed Weights   12/18/18 0900  Weight: 69.4 kg   Gen: 73 y.o. male in no distress Pulm: Nonlabored breathing room air. Clear. CV: Regular rate and rhythm. No murmur, rub, or gallop. No JVD, no dependent edema. GI: Abdomen soft, non-tender, non-distended, with normoactive bowel sounds.  Ext: Warm, no deformities Skin: No rashes, lesions or ulcers on visualized skin. RLQ urostomy pink, no erythema/induration/tenderness Neuro: Alert and oriented. +expressive  aphasia, sparse words, slow but appropriate response. No other focal neurological deficits. Psych: Judgement and insight appear fair. Mood euthymic & affect congruent. Behavior is appropriate.    Data Reviewed: I have personally reviewed following labs and imaging studies  CBC: Recent Labs  Lab 12/17/18 2032  12/18/18 0049 12/18/18 0322  WBC 8.5 8.1 7.8  NEUTROABS 4.6  --   --   HGB 14.5 13.7 13.8  HCT 45.0 43.0 44.6  MCV 111.1* 109.4* 110.4*  PLT 267 239 253   Basic Metabolic Panel: Recent Labs  Lab 12/17/18 2032 12/18/18 0049 12/18/18 0322  NA 141  --  139  K 3.9  --  3.7  CL 106  --  106  CO2 24  --  24  GLUCOSE 98  --  101*  BUN 18  --  15  CREATININE 0.98 0.98 0.81  CALCIUM 9.8  --  9.3   GFR: Estimated Creatinine Clearance: 80.9 mL/min (by C-G formula based on SCr of 0.81 mg/dL). Liver Function Tests: Recent Labs  Lab 12/17/18 2032 12/18/18 0322  AST 46* 40  ALT 46* 43  ALKPHOS 75 77  BILITOT 0.9 1.2  PROT 7.8 7.4  ALBUMIN 3.6 3.4*   No results for input(s): LIPASE, AMYLASE in the last 168 hours. No results for input(s): AMMONIA in the last 168 hours. Coagulation Profile: Recent Labs  Lab 12/17/18 2032  INR 0.95   Cardiac Enzymes: Recent Labs  Lab 12/18/18 0049  TROPONINI <0.03   BNP (last 3 results) No results for input(s): PROBNP in the last 8760 hours. HbA1C: Recent Labs    12/18/18 0322  HGBA1C 4.2*   CBG: No results for input(s): GLUCAP in the last 168 hours. Lipid Profile: Recent Labs    12/18/18 0322  CHOL 178  HDL 41  LDLCALC 113*  TRIG 119  CHOLHDL 4.3   Thyroid Function Tests: No results for input(s): TSH, T4TOTAL, FREET4, T3FREE, THYROIDAB in the last 72 hours. Anemia Panel: Recent Labs    12/19/18 0619  VITAMINB12 322  FOLATE 7.0   Urine analysis:    Component Value Date/Time   COLORURINE AMBER (A) 12/17/2018 2159   APPEARANCEUR CLOUDY (A) 12/17/2018 2159   LABSPEC 1.012 12/17/2018 2159   PHURINE 9.0 (H) 12/17/2018 2159   GLUCOSEU NEGATIVE 12/17/2018 2159   GLUCOSEU NEGATIVE 08/21/2015 1412   HGBUR NEGATIVE 12/17/2018 2159   BILIRUBINUR NEGATIVE 12/17/2018 2159   BILIRUBINUR negative 12/20/2017 1846   KETONESUR NEGATIVE 12/17/2018 2159   PROTEINUR 100 (A) 12/17/2018 2159   UROBILINOGEN 0.2 12/20/2017 1846    UROBILINOGEN 0.2 08/21/2015 1412   NITRITE NEGATIVE 12/17/2018 2159   LEUKOCYTESUR TRACE (A) 12/17/2018 2159   Recent Results (from the past 240 hour(s))  Urine Culture     Status: Abnormal   Collection Time: 12/17/18 11:00 PM  Result Value Ref Range Status   Specimen Description URINE, RANDOM  Final   Special Requests   Final    NONE Performed at [facility], [place] [address], [place], [place] [address]    Culture MULTIPLE SPECIES PRESENT, SUGGEST RECOLLECTION (A)  Final   Report Status 12/19/2018 FINAL  Final      Radiology Studies: Vas US Carotid (at [facility] Only)  Result Date: 12/19/2018 Carotid Arterial Duplex Study Indications:   CVA and Speech disturbance. Risk Factors:  Hypertension, prior CVA. Other Factors: Non compliant with medications.  Performing Technologist: Toma Copier RVS  Examination Guidelines: A complete evaluation includes B-mode imaging, spectral  Doppler, color Doppler, and power Doppler as needed of all accessible portions of each vessel. Bilateral testing is considered an integral part of a complete examination. Limited examinations for reoccurring indications may be performed as noted.  Right Carotid Findings: +----------+--------+--------+--------+------------+--------------------+           PSV cm/sEDV cm/sStenosisDescribe    Comments             +----------+--------+--------+--------+------------+--------------------+ CCA Prox  75      4                           mild intimal changes +----------+--------+--------+--------+------------+--------------------+ CCA Distal63      9                           mild intimal changes +----------+--------+--------+--------+------------+--------------------+ ICA Prox  32      7               heterogenousmild plaque          +----------+--------+--------+--------+------------+--------------------+ ICA Mid   67      14                          tortuous              +----------+--------+--------+--------+------------+--------------------+ ICA Distal76      16                          tortuous             +----------+--------+--------+--------+------------+--------------------+ ECA       112     12              heterogenousmild plaque          +----------+--------+--------+--------+------------+--------------------+ +----------+--------+-------+--------+-------------------+           PSV cm/sEDV cmsDescribeArm Pressure (mmHG) +----------+--------+-------+--------+-------------------+ [id number]                                         +----------+--------+-------+--------+-------------------+ +---------+--------+--+--------+-+ VertebralPSV cm/s46EDV cm/s8 +---------+--------+--+--------+-+ 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  Left Carotid Findings: +----------+--------+--------+--------+------------+---------------------------+           PSV cm/sEDV cm/sStenosisDescribe    Comments                    +----------+--------+--------+--------+------------+---------------------------+ CCA Prox  84      3                           mild intimal changes        +----------+--------+--------+--------+------------+---------------------------+ CCA Distal61      9                           mild intimal changes        +----------+--------+--------+--------+------------+---------------------------+ ICA Prox  50      10              heterogenousmild plaque                 +----------+--------+--------+--------+------------+---------------------------+ ICA Mid   86      19              heterogenousNot visualized              +----------+--------+--------+--------+------------+---------------------------+  ICA Distal                                    unable to visualize the                                                   most distal ICA due to high                                                bifurcation]                +----------+--------+--------+--------+------------+---------------------------+ ECA       36      5               heterogenousmild plaque                 +----------+--------+--------+--------+------------+---------------------------+ +----------+--------+--------+--------+-------------------+ SubclavianPSV cm/sEDV cm/sDescribeArm Pressure (mmHG) +----------+--------+--------+--------+-------------------+           151                                         +----------+--------+--------+--------+-------------------+ +---------+--------+--+--------+-+ VertebralPSV cm/s34EDV cm/s6 +---------+--------+--+--------+-+ Technically difficult due to 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  Summary: Right Carotid: Velocities in the right ICA are consistent with a 1-39% stenosis.                See technician notes listed above. Left Carotid: Velocities in the left ICA are consistent with a 1-39% stenosis.               See technician notes listed above. Vertebrals:  Bilateral vertebral arteries demonstrate antegrade flow. Subclavians: Normal flow hemodynamics were seen in bilateral subclavian              arteries. *See table(s) above for measurements and observations.  Electronically signed by Antony Contras MD on 12/19/2018 at 2:05:41 PM.    Final     Scheduled Meds: . aspirin EC  81 mg Oral Daily  . atorvastatin  80 mg Oral q1800  . clopidogrel  75 mg Oral Daily  . enoxaparin (LOVENOX) injection  40 mg Subcutaneous Daily  . losartan  100 mg Oral Daily   Continuous Infusions: . sodium chloride 100 mL/hr at 12/18/18 2257     LOS: 3 days   Time spent: 25 minutes.  Patrecia Pour, MD [facility] [email or web address] Password [email or web address] 12/20/2018, 2:42 PM

## 2018-12-20 NOTE — Progress Notes (Signed)
Physical Therapy Treatment Patient Details Name: Ronald Wolf MRN: [id number] DOB: May 23, 1946 Today's Date: 12/20/2018    History of Present Illness Ronald Wolf is a 73 y.o. male with medical history significant of previous CVA, hypertension, recurrent UTIs, previous alcohol abuse, GERD, history of bladder cancer and medication noncompliance , brought in with AMS and imaging now showing L MCA infarct    PT Comments    Pt slowly improving.  There is not a great difference in his stability with the cane or the RW, but appears more tentative with the cane.  Pt clearly has a visual field deficit on the right and consistently runs into obstacles on the right and misses door ways and passages, ie the walkway around the foot of the bed to the chair.    Follow Up Recommendations  SNF;Supervision/Assistance - 24 hour     Equipment Recommendations  None recommended by PT(may need RW )    Recommendations for Other Services       Precautions / Restrictions Precautions Precautions: Fall    Mobility  Bed Mobility Overal bed mobility: Needs Assistance Bed Mobility: Supine to Sit     Supine to sit: Min guard     General bed mobility comments: extra time to get to EOB, but no rail or assist  Transfers Overall transfer level: Needs assistance   Transfers: Sit to/from Stand Sit to Stand: Min assist         General transfer comment: steadying assist at least this first time up today  Ambulation/Gait Ambulation/Gait assistance: Min assist Gait Distance (Feet): 80 Feet(x2, first with the cane then with the RW.) Assistive device: Rolling walker (2 wheeled);Straight cane Gait Pattern/deviations: Step-through pattern;Decreased step length - right;Decreased step length - left;Decreased stride length Gait velocity: slow Gait velocity interpretation: <1.31 ft/sec, indicative of household ambulator General Gait Details: Short, low amplitude steps continue with consistently flexed knees  whether with cane or RW.  pt also stays too far behine the RW, but is not as tentative as with just the cane.   Stairs             Wheelchair Mobility    Modified Rankin (Stroke Patients Only) Modified Rankin (Stroke Patients Only) Pre-Morbid Rankin Score: Slight disability Modified Rankin: Moderately severe disability     Balance Overall balance assessment: Needs assistance Sitting-balance support: Feet supported Sitting balance-Leahy Scale: Fair     Standing balance support: Bilateral upper extremity supported;During functional activity;No upper extremity supported Standing balance-Leahy Scale: Poor Standing balance comment: reliant on external stability or an AD                            Cognition Arousal/Alertness: Awake/alert Behavior During Therapy: WFL for tasks assessed/performed Overall Cognitive Status: Impaired/Different from baseline Area of Impairment: Orientation;Attention;Memory;Following commands;Safety/judgement;Awareness;Problem solving                 Orientation Level: Time;Situation Current Attention Level: Sustained Memory: Decreased short-term memory Following Commands: Follows one step commands inconsistently;Follows one step commands with increased time Safety/Judgement: Decreased awareness of safety Awareness: Intellectual Problem Solving: Slow processing;Difficulty sequencing;Decreased initiation;Requires verbal cues;Requires tactile cues        Exercises      General Comments        Pertinent Vitals/Pain Pain Assessment: Faces Faces Pain Scale: No hurt    Home Living  Prior Function            PT Goals (current goals can now be found in the care plan section) Acute Rehab PT Goals PT Goal Formulation: Patient unable to participate in goal setting Time For Goal Achievement: 12/25/18 Potential to Achieve Goals: Good Progress towards PT goals: Progressing toward goals     Frequency    Min 3X/week      PT Plan Current plan remains appropriate    Co-evaluation              AM-PAC PT "6 Clicks" Mobility   Outcome Measure  Help needed turning from your back to your side while in a flat bed without using bedrails?: A Little Help needed moving from lying on your back to sitting on the side of a flat bed without using bedrails?: A Little Help needed moving to and from a bed to a chair (including a wheelchair)?: A Little Help needed standing up from a chair using your arms (e.g., wheelchair or bedside chair)?: A Little Help needed to walk in hospital room?: A Little Help needed climbing 3-5 steps with a railing? : A Little 6 Click Score: 18    End of Session   Activity Tolerance: Patient tolerated treatment well;Patient limited by fatigue Patient left: in chair;with call bell/phone within reach;with chair alarm set Nurse Communication: Mobility status PT Visit Diagnosis: Unsteadiness on feet (R26.81);Other abnormalities of gait and mobility (R26.89);Hemiplegia and hemiparesis Hemiplegia - Right/Left: Right Hemiplegia - dominant/non-dominant: Dominant Hemiplegia - caused by: Cerebral infarction     Time: 1436-1500 PT Time Calculation (min) (ACUTE ONLY): 24 min  Charges:  $Gait Training: 8-22 mins $Therapeutic Activity: 8-22 mins                     12/20/2018  Donnella Sham, PT Acute Rehabilitation Services [phone]  (pager) [phone]  (office)   Ronald Wolf 12/20/2018, 3:30 PM

## 2018-12-21 NOTE — Progress Notes (Signed)
CSW received email back from daughter this morning that she either deleted CSW email or didn't receive the email, so CSW sent it back to daughter. Daughter emailed back that she had received the email, and would review facility options and come up with a decision as soon as possible.  Ronald Wolf, [place] Clinical Social Worker [phone]

## 2018-12-21 NOTE — Progress Notes (Signed)
CSW met with patient's significant other and daughter at bedside to discuss offers and where they are with their research. Patient's daughter requested [facility], [facility], or [facility]. CSW reached out to [facility] and [facility], and they are both full; [facility] has initiated [employer] authorization.  CSW to continue to follow.   , LCSW Clinical Social Worker [phone]  

## 2018-12-21 NOTE — Progress Notes (Signed)
PROGRESS NOTE  Ronald Wolf  EXB:[id number] DOB: 1946-05-31 DOA: 12/17/2018 PCP: Mosie Lukes, MD   Brief Narrative: Ronald Wolf is a 73 y.o. male with a history of prior CVA, HTN, tobacco use, recurrent UTI's, alcohol abuse in remission, GERD, history of bladder CA and medical nonadherence who presented to the ED with progressive AMS and aphasia. Found to be hypertensive, having not taken BP medications regularly. Initial concern was for UTI, though MRI demonstrated acute to subacute watershed left MCA infarcts. CT angiography revealed multivessel atherosclerotic disease with mod-severe left ICA stenosis and severe M2 stenosis. High-dose statin and dual antiplatelet therapy were initiated and therapy consulted. Current recommendation is for acute rehabilitation at SNF, CSW consulted, FL2 signed.  Assessment & Plan: Principal Problem:   CVA (cerebral vascular accident) ([place]) Active Problems:   HTN (hypertension)   Anemia   Smoking   UTI (urinary tract infection)   History of bladder cancer  Acute MCA territory CVA's on chronic CVD: With resultant expressive aphasia. No CES on echocardiogram. Carotid U/S with bilateral 1-39% stenosis with high level of bifurcation. HbA1c 4.2%. - Discussed with neurology, Dr. Leonie Man. Due to atherosclerotic burden, pt was loaded with plavix 300mg  followed by [facility] with plans to continue aspirin indefinitely and plavix for 3 months. - With significant deficits, PT, OT recommend SNF at discharge. CSW consulted. - Started high intensity statin (LDL 113)  HTN with hypertensive urgency:  - Moving out of permissive HTN window. Continue ARB at high dose. Metop added as below.  History of bladder CA s/p urostomy:  - Oncology follow up recommended - WOC consulted for management/supplies.   SVT: Short runs noted on telemetry.  - Started low dose metoprolol and continue monitoring, consider cardiac monitoring as outpatient, though stroke not consistent with embolic  etiology. HR 50's-70's.  Anemia of chronic disease: Currently hgb is stable in 13's. - Monitor intermittently. - Macrocytosis noted, though B12 and folate are wnl.  Tobacco use:  - Cessation counseling provided.   Pyuria: Urine culture nonclonal. This is not likely a true infection. No leukocytosis or fever.   DVT prophylaxis: Lovenox Code Status: Full Family Communication: None at bedside this AM. Disposition Plan: Medically stable for discharge to SNF. CSW working diligently. Insurance authorization in process for [place].   Consultants:   Neurology, stroke team  Procedures:   None  Antimicrobials:  None  Subjective: No new complaints, wants to get better as fast as possible, knows SNF is the best chance of that. No new numbness, weakness.  Objective: Vitals:   12/20/18 1532 12/20/18 1934 12/20/18 2313 12/21/18 0755  BP:  (!) 159/72 (!) 149/70 136/70  Pulse: 75 70 77 65  Resp:  20 20 17   Temp:  98.7 F (37.1 C) 98.5 F (36.9 C) 98.7 F (37.1 C)  TempSrc:  Oral Oral Oral  SpO2:  97% 98% 100%  Weight:      Height:        Intake/Output Summary (Last 24 hours) at 12/21/2018 1419 Last data filed at 12/21/2018 0000 Gross per 24 hour  Intake 705.61 ml  Output 800 ml  Net -94.39 ml   Filed Weights   12/18/18 0900  Weight: 69.4 kg   Gen: 73 y.o. male in no distress Pulm: Nonlabored breathing room air. Clear. CV: Regular rate and rhythm. No murmur, rub, or gallop. No JVD, no dependent edema. GI: Abdomen soft, non-tender, non-distended, with normoactive bowel sounds.  Ext: Warm, no deformities Skin: No rashes, lesions or  ulcers on visualized skin. Neuro: Alert and oriented. +expressive aphasia, delayed response but cognitive function intact. Psych: Judgement and insight appear fair. Mood euthymic & affect congruent. Behavior is appropriate.    Data Reviewed: I have personally reviewed following labs and imaging studies  CBC: Recent Labs  Lab 12/17/18 2032  12/18/18 0049 12/18/18 0322  WBC 8.5 8.1 7.8  NEUTROABS 4.6  --   --   HGB 14.5 13.7 13.8  HCT 45.0 43.0 44.6  MCV 111.1* 109.4* 110.4*  PLT 267 239 962   Basic Metabolic Panel: Recent Labs  Lab 12/17/18 2032 12/18/18 0049 12/18/18 0322  NA 141  --  139  K 3.9  --  3.7  CL 106  --  106  CO2 24  --  24  GLUCOSE 98  --  101*  BUN 18  --  15  CREATININE 0.98 0.98 0.81  CALCIUM 9.8  --  9.3   GFR: Estimated Creatinine Clearance: 80.9 mL/min (by C-G formula based on SCr of 0.81 mg/dL).  Liver Function Tests: Recent Labs  Lab 12/17/18 2032 12/18/18 0322  AST 46* 40  ALT 46* 43  ALKPHOS 75 77  BILITOT 0.9 1.2  PROT 7.8 7.4  ALBUMIN 3.6 3.4*   Coagulation Profile: Recent Labs  Lab 12/17/18 2032  INR 0.95   Cardiac Enzymes: Recent Labs  Lab 12/18/18 0049  TROPONINI <0.03    Anemia Panel: Recent Labs    12/19/18 0619  VITAMINB12 322  FOLATE 7.0   Urine analysis:    Component Value Date/Time   COLORURINE AMBER (A) 12/17/2018 2159   APPEARANCEUR CLOUDY (A) 12/17/2018 2159   LABSPEC 1.012 12/17/2018 2159   PHURINE 9.0 (H) 12/17/2018 2159   GLUCOSEU NEGATIVE 12/17/2018 2159   GLUCOSEU NEGATIVE 08/21/2015 1412   HGBUR NEGATIVE 12/17/2018 2159   BILIRUBINUR NEGATIVE 12/17/2018 2159   BILIRUBINUR negative 12/20/2017 1846   KETONESUR NEGATIVE 12/17/2018 2159   PROTEINUR 100 (A) 12/17/2018 2159   UROBILINOGEN 0.2 12/20/2017 1846   UROBILINOGEN 0.2 08/21/2015 1412   NITRITE NEGATIVE 12/17/2018 2159   LEUKOCYTESUR TRACE (A) 12/17/2018 2159   Recent Results (from the past 240 hour(s))  Urine Culture     Status: Abnormal   Collection Time: 12/17/18 11:00 PM  Result Value Ref Range Status   Specimen Description URINE, RANDOM  Final   Special Requests   Final    NONE Performed at [facility], [place] [address], [place], [place] [address]    Culture MULTIPLE SPECIES PRESENT, SUGGEST RECOLLECTION (A)  Final   Report Status 12/19/2018 FINAL  Final        Radiology Studies: No results found.  Scheduled Meds: . aspirin EC  81 mg Oral Daily  . atorvastatin  80 mg Oral q1800  . clopidogrel  75 mg Oral Daily  . enoxaparin (LOVENOX) injection  40 mg Subcutaneous Daily  . losartan  100 mg Oral Daily  . metoprolol tartrate  12.5 mg Oral BID   Continuous Infusions: . sodium chloride Stopped (12/20/18 2305)    LOS: 4 days   Time spent: 25 minutes.  Patrecia Pour, MD [facility] [email or web address] Password [email or web address] 12/21/2018, 2:19 PM

## 2018-12-21 NOTE — Progress Notes (Signed)
  Speech Language Pathology Treatment: Cognitive-Linquistic  Patient Details Name: Ronald Wolf MRN: [id number] DOB: Apr 06, 1946 Today's Date: 12/21/2018 Time: 9675-9163 SLP Time Calculation (min) (ACUTE ONLY): 25 min  Assessment / Plan / Recommendation Clinical Impression  Pt presents with much improved spontaneity of expression - he is speaking in fluent sentences; no dysarthria; answers questions, engages in social communication and humor appropriately.  Pt is less willing to participate in formal assessment - he had difficulty initiating responses and completing tasks related to clock-drawing, number/letter sequencing.  Demonstrated delayed processing of instructions and difficulty executing task, exacerbated by his skepticism of purpose of testing.  He is following simple, contextual directions related to self and environment.  Communication is functional to meet basic needs.  No further acute SLP f/u is warranted.  Pt may benefit from further assessment after admission to SNF rehab.    HPI HPI: 73 y.o. male with medical history significant for previous CVA, hypertension, recurrent UTIs, previous alcohol abuse, GERD, history of bladder cancer and medication noncompliance who was brought in due to progressive altered mental status.  MRI 12/18/18 reveals patchy multifocal acute to early subacute left MCA territory infarcts involving the left frontal lobe and basal ganglia. Associated faint petechial hemorrhage without frank hemorrhagictransformation. No significant mass effect. 2. Chronic left parieto-occipital infarct 3. Underlying moderately advanced cerebral atrophy with chronic      SLP Plan  All goals met       Recommendations                   Oral Care Recommendations: Oral care BID Follow up Recommendations: 24 hour supervision/assistance SLP Visit Diagnosis: Cognitive communication deficit (W46.659) Plan: All goals met                     Margareta Laureano L. Tivis Ringer, [place]  CCC/SLP Acute Rehabilitation Services Office number [phone] Pager [phone]   Juan Quam Laurice 12/21/2018, 3:11 PM

## 2018-12-22 NOTE — Progress Notes (Addendum)
CSW contacted by [facility] about updated PT notes. CSW alerted PT and sent updated note once received. Patient continues to await authorization to admit to SNF when authorization received. CSW met with daughter at bedside to discuss how the only hold up at this point is waiting on authorization.  CSW to follow.  Laveda Abbe, [place] Clinical Social Worker [phone]

## 2018-12-22 NOTE — Progress Notes (Signed)
Physical Therapy Treatment Patient Details Name: Ronald Wolf MRN: [id number] DOB: August 19, 1946 Today's Date: 12/22/2018    History of Present Illness Ronald Wolf is a 73 y.o. male with medical history significant of previous CVA, hypertension, recurrent UTIs, previous alcohol abuse, GERD, history of bladder cancer and medication noncompliance , brought in with AMS and imaging now showing L MCA infarct    PT Comments    Pt is making slow gains with mobility and gait stability, but safety still further hampered by pt not acknowledging or attending to information/objects in his right /visual field deficit. Furthermore, pt is having some trouble doing various cognitive tasks such as using directional cues to return to his room.   Follow Up Recommendations  SNF;Supervision/Assistance - 24 hour     Equipment Recommendations  None recommended by PT    Recommendations for Other Services       Precautions / Restrictions Precautions Precautions: Fall    Mobility  Bed Mobility               General bed mobility comments: OOB on arrival  Transfers Overall transfer level: Needs assistance Equipment used: Rolling walker (2 wheeled) Transfers: Sit to/from Stand Sit to Stand: Min assist(min guard from elevated surface)         General transfer comment: assist need to get pt more forward so he could boost up.  Ambulation/Gait Ambulation/Gait assistance: Min assist Gait Distance (Feet): 180 Feet Assistive device: Rolling walker (2 wheeled) Gait Pattern/deviations: Step-through pattern Gait velocity: slow Gait velocity interpretation: <1.31 ft/sec, indicative of household ambulator General Gait Details: Steps continue short with mildly flexed knees.  Pt needs consistent cues to scan his environment, even left, but has to be physically cued to look right.   Stairs             Wheelchair Mobility    Modified Rankin (Stroke Patients Only) Modified Rankin (Stroke  Patients Only) Modified Rankin: Moderately severe disability     Balance Overall balance assessment: Needs assistance Sitting-balance support: Feet supported Sitting balance-Leahy Scale: Fair       Standing balance-Leahy Scale: Poor Standing balance comment: reliant on external stability or an AD                            Cognition Arousal/Alertness: Awake/alert Behavior During Therapy: WFL for tasks assessed/performed Overall Cognitive Status: Impaired/Different from baseline                   Orientation Level: Time;Situation Current Attention Level: Sustained Memory: Decreased short-term memory Following Commands: Follows one step commands with increased time;Follows one step commands inconsistently Safety/Judgement: Decreased awareness of safety Awareness: Intellectual Problem Solving: Slow processing General Comments: pt is not aware of much of his surrounding on the right.  Pt can't use the directional cues in the hospital even if assisted or led through the cognitive task.      Exercises      General Comments General comments (skin integrity, edema, etc.): Daughter present to witness how pt mobilizes and to receive updates.      Pertinent Vitals/Pain Pain Assessment: Faces Faces Pain Scale: No hurt    Home Living                      Prior Function            PT Goals (current goals can now be found in the care plan  section) Acute Rehab PT Goals Patient Stated Goal: none stated, per family to return to PLOF PT Goal Formulation: Patient unable to participate in goal setting Time For Goal Achievement: 12/25/18 Potential to Achieve Goals: Good Progress towards PT goals: Progressing toward goals    Frequency    Min 2X/week      PT Plan Current plan remains appropriate    Co-evaluation              AM-PAC PT "6 Clicks" Mobility   Outcome Measure  Help needed turning from your back to your side while in a flat  bed without using bedrails?: A Little Help needed moving from lying on your back to sitting on the side of a flat bed without using bedrails?: A Little Help needed moving to and from a bed to a chair (including a wheelchair)?: A Little Help needed standing up from a chair using your arms (e.g., wheelchair or bedside chair)?: A Little Help needed to walk in hospital room?: A Little Help needed climbing 3-5 steps with a railing? : A Little 6 Click Score: 18    End of Session   Activity Tolerance: Patient tolerated treatment well;Other (comment) Patient left: in chair;with call bell/phone within reach;with chair alarm set Nurse Communication: Mobility status PT Visit Diagnosis: Unsteadiness on feet (R26.81);Other abnormalities of gait and mobility (R26.89)     Time: 1914-7829 PT Time Calculation (min) (ACUTE ONLY): 23 min  Charges:  $Gait Training: 8-22 mins $Therapeutic Activity: 8-22 mins                     12/22/2018  Donnella Sham, PT [place] [phone]  (pager) [phone]  (office)   Ronald Wolf 12/22/2018, 1:19 PM

## 2018-12-22 NOTE — Progress Notes (Signed)
PROGRESS NOTE  Ronald Wolf  UDJ:[id number] DOB: 1946/06/16 DOA: 12/17/2018 PCP: Mosie Lukes, MD   Brief Narrative: Ronald Wolf is a 73 y.o. male with a history of prior CVA, HTN, tobacco use, recurrent UTI's, alcohol abuse in remission, GERD, history of bladder CA and medical nonadherence who presented to the ED with progressive AMS and aphasia. Found to be hypertensive, having not taken BP medications regularly. Initial concern was for UTI, though MRI demonstrated acute to subacute watershed left MCA infarcts. CT angiography revealed multivessel atherosclerotic disease with mod-severe left ICA stenosis and severe M2 stenosis. High-dose statin and dual antiplatelet therapy were initiated and therapy consulted. Current recommendation is for acute rehabilitation at SNF, CSW consulted, FL2 signed.  Assessment & Plan: Principal Problem:   CVA (cerebral vascular accident) ([place]) Active Problems:   HTN (hypertension)   Anemia   Smoking   UTI (urinary tract infection)   History of bladder cancer  Acute MCA territory CVA's on chronic CVD: With resultant expressive aphasia. No CES on echocardiogram. Carotid U/S with bilateral 1-39% stenosis with high level of bifurcation. HbA1c 4.2%. - Patient evaluated by Dr. Leonie Man. Due to atherosclerotic burden, pt was loaded with plavix 300mg  followed by [facility] with plans to continue aspirin indefinitely and plavix for 3 months. - With significant deficits, PT, OT recommend SNF at discharge. CSW consulted. - Started high intensity statin (LDL 113) - Needs neurology follow up in 6-8 weeks.  HTN with hypertensive urgency:  - Moving out of permissive HTN window. Continue ARB at high dose. Metop added as below. BP improved.  History of bladder CA s/p urostomy:  - Oncology follow up recommended - WOC consulted for management/supplies.   SVT: Short runs noted on telemetry.  - Started low dose metoprolol and continue monitoring, consider cardiac monitoring as  outpatient, though stroke not consistent with embolic etiology. HR remains without significant bradycardia and mild 1st deg AVB on my personal review of telemetry today. No changes to beta blocker.  Anemia of chronic disease: Currently hgb is stable in 13's. - Monitor intermittently. - Macrocytosis noted, though B12 and folate are wnl.  Tobacco use:  - Cessation counseling provided.   Pyuria: Urine culture nonclonal. This is not likely a true infection. No leukocytosis or fever.   DVT prophylaxis: Lovenox Code Status: Full Family Communication: None at bedside this AM. Disposition Plan: Medically stable for discharge to SNF. CSW working diligently. Insurance authorization in process for [place].   Consultants:   Neurology, stroke team  Procedures:   None  Antimicrobials:  None  Subjective: Continues to work with therapy, eating ok, no palpitations, dizziness/lightheadedness, chest pain or dyspnea.   Objective: Vitals:   12/22/18 0449 12/22/18 0754 12/22/18 1216 12/22/18 1646  BP: (!) 146/79 (!) 160/74 135/75 112/60  Pulse: (!) 57 61 (!) 59 70  Resp: 18 17 16 17   Temp: 98.4 F (36.9 C) 98.9 F (37.2 C) 98.1 F (36.7 C) 98.7 F (37.1 C)  TempSrc: Oral Oral Oral Oral  SpO2: 95% 100% 100% 100%  Weight:      Height:        Intake/Output Summary (Last 24 hours) at 12/22/2018 1920 Last data filed at 12/22/2018 1551 Gross per 24 hour  Intake -  Output 1150 ml  Net -1150 ml   Filed Weights   12/18/18 0900  Weight: 69.4 kg   Gen: 73 y.o. male in no distress Pulm: Nonlabored breathing room air. Clear. CV: Regular rate and rhythm. No murmur, rub,  or gallop. No JVD, no dependent edema. GI: Abdomen soft, non-tender, non-distended, with normoactive bowel sounds.  Ext: Warm, no deformities Skin: No new rashes, lesions or ulcers on visualized skin. Neuro: Alert and oriented. Expressive aphasia continues, some delayed processing continues. No new focal neurological  deficits. Psych: Judgement and insight appear fair. Mood euthymic & affect congruent. Behavior is appropriate.    Data Reviewed: I have personally reviewed following labs and imaging studies  CBC: Recent Labs  Lab 12/17/18 2032 12/18/18 0049 12/18/18 0322  WBC 8.5 8.1 7.8  NEUTROABS 4.6  --   --   HGB 14.5 13.7 13.8  HCT 45.0 43.0 44.6  MCV 111.1* 109.4* 110.4*  PLT 267 239 203   Basic Metabolic Panel: Recent Labs  Lab 12/17/18 2032 12/18/18 0049 12/18/18 0322  NA 141  --  139  K 3.9  --  3.7  CL 106  --  106  CO2 24  --  24  GLUCOSE 98  --  101*  BUN 18  --  15  CREATININE 0.98 0.98 0.81  CALCIUM 9.8  --  9.3   GFR: Estimated Creatinine Clearance: 80.9 mL/min (by C-G formula based on SCr of 0.81 mg/dL).  Liver Function Tests: Recent Labs  Lab 12/17/18 2032 12/18/18 0322  AST 46* 40  ALT 46* 43  ALKPHOS 75 77  BILITOT 0.9 1.2  PROT 7.8 7.4  ALBUMIN 3.6 3.4*   Coagulation Profile: Recent Labs  Lab 12/17/18 2032  INR 0.95   Cardiac Enzymes: Recent Labs  Lab 12/18/18 0049  TROPONINI <0.03    Anemia Panel: No results for input(s): VITAMINB12, FOLATE, FERRITIN, TIBC, IRON, RETICCTPCT in the last 72 hours. Urine analysis:    Component Value Date/Time   COLORURINE AMBER (A) 12/17/2018 2159   APPEARANCEUR CLOUDY (A) 12/17/2018 2159   LABSPEC 1.012 12/17/2018 2159   PHURINE 9.0 (H) 12/17/2018 2159   GLUCOSEU NEGATIVE 12/17/2018 2159   GLUCOSEU NEGATIVE 08/21/2015 1412   HGBUR NEGATIVE 12/17/2018 2159   BILIRUBINUR NEGATIVE 12/17/2018 2159   BILIRUBINUR negative 12/20/2017 1846   KETONESUR NEGATIVE 12/17/2018 2159   PROTEINUR 100 (A) 12/17/2018 2159   UROBILINOGEN 0.2 12/20/2017 1846   UROBILINOGEN 0.2 08/21/2015 1412   NITRITE NEGATIVE 12/17/2018 2159   LEUKOCYTESUR TRACE (A) 12/17/2018 2159   Recent Results (from the past 240 hour(s))  Urine Culture     Status: Abnormal   Collection Time: 12/17/18 11:00 PM  Result Value Ref Range Status    Specimen Description URINE, RANDOM  Final   Special Requests   Final    NONE Performed at [facility], [place] [address], [place], [place] [address]    Culture MULTIPLE SPECIES PRESENT, SUGGEST RECOLLECTION (A)  Final   Report Status 12/19/2018 FINAL  Final      Radiology Studies: No results found.  Scheduled Meds: . aspirin EC  81 mg Oral Daily  . atorvastatin  80 mg Oral q1800  . clopidogrel  75 mg Oral Daily  . enoxaparin (LOVENOX) injection  40 mg Subcutaneous Daily  . losartan  100 mg Oral Daily  . metoprolol tartrate  12.5 mg Oral BID   Continuous Infusions: . sodium chloride Stopped (12/20/18 2305)    LOS: 5 days   Time spent: 25 minutes.  Patrecia Pour, MD [facility] [email or web address] Password [email or web address] 12/22/2018, 7:20 PM

## 2018-12-22 NOTE — Plan of Care (Signed)
Pt is progressing toward desired goal 

## 2018-12-23 NOTE — Progress Notes (Signed)
PROGRESS NOTE  Ronald Wolf  ASN:[id number] DOB: 09/17/1946 DOA: 12/17/2018 PCP: Mosie Lukes, MD   Brief Narrative: Ronald Wolf is a 73 y.o. male with a history of prior CVA, HTN, tobacco use, recurrent UTI's, alcohol abuse in remission, GERD, history of bladder CA and medical nonadherence who presented to the ED with progressive AMS and aphasia. Found to be hypertensive, having not taken BP medications regularly. Initial concern was for UTI, though MRI demonstrated acute to subacute watershed left MCA infarcts. CT angiography revealed multivessel atherosclerotic disease with mod-severe left ICA stenosis and severe M2 stenosis. High-dose statin and dual antiplatelet therapy were initiated and therapy consulted. Current recommendation is for acute rehabilitation at SNF, CSW consulted, FL2 signed.  Assessment & Plan: Principal Problem:   CVA (cerebral vascular accident) ([place]) Active Problems:   HTN (hypertension)   Anemia   Smoking   UTI (urinary tract infection)   History of bladder cancer  Acute MCA territory CVA's on chronic CVD: With resultant expressive aphasia. No CES on echocardiogram. Carotid U/S with bilateral 1-39% stenosis with high level of bifurcation. HbA1c 4.2%. - Patient evaluated by Dr. Leonie Man. Due to atherosclerotic burden, pt was loaded with plavix 300mg  followed by [facility] with plans to continue aspirin indefinitely and plavix for 3 months. - With significant deficits, PT, OT recommend SNF at discharge. CSW consulted. - Started high intensity statin (LDL 113) - Needs neurology follow up in 6-8 weeks.  HTN with hypertensive urgency:  - Moving out of permissive HTN window. Continue ARB at high dose. Metop added as below. BP improved. 129/66 today.  History of bladder CA s/p urostomy:  - Oncology follow up recommended - WOC consulted for management/supplies.   SVT: Short runs noted on telemetry.  - Started low dose metoprolol and continue monitoring, consider cardiac  monitoring as outpatient, though stroke not consistent with embolic etiology. HR stable on my review of telemetry today. Continue beta blocker.  Anemia of chronic disease: Currently hgb is stable in 13's. - Monitor intermittently. - Macrocytosis noted, though B12 and folate are wnl.  Tobacco use:  - Cessation counseling provided.   Pyuria: Urine culture nonclonal. This is not likely a true infection. No leukocytosis or fever.   DVT prophylaxis: Lovenox Code Status: Full Family Communication: None at bedside this AM. Disposition Plan: Medically stable for discharge to SNF. CSW working diligently. Insurance authorization in process for [place].   Consultants:   Neurology, stroke team  Procedures:   None  Antimicrobials:  None  Subjective: Frustrated with restrictions due to fall risk. Eating well. No new numbness or weakness.   Objective: Vitals:   12/23/18 0035 12/23/18 0402 12/23/18 0731 12/23/18 1218  BP: (!) 120/59 (!) 146/78 (!) 151/72 129/66  Pulse: 64 65 61 (!) 58  Resp: 18 18 18 18   Temp: 98.4 F (36.9 C) 98.3 F (36.8 C) 98.2 F (36.8 C) 98.1 F (36.7 C)  TempSrc: Oral Oral Oral Oral  SpO2: 100% 100% 100% 100%  Weight:      Height:        Intake/Output Summary (Last 24 hours) at 12/23/2018 1221 Last data filed at 12/22/2018 1551 Gross per 24 hour  Intake -  Output 600 ml  Net -600 ml   Filed Weights   12/18/18 0900  Weight: 69.4 kg   Gen: 73 y.o. male in no distress Pulm: Nonlabored breathing room air. Clear. CV: Regular borderline bradycardia. No murmur, JVD, or dependent edema. GI: Abdomen soft, non-tender, non-distended, with normoactive  bowel sounds. Urostomy site intact, no tenderness or erythema. Ext: Warm, no deformities Skin: No new rashes, lesions or ulcers on visualized skin. Neuro: Alert and oriented. Expressive aphasia is stable. Psych: Judgement and insight appear fair. Mood depressed & affect restricted. Behavior is appropriate.      Data Reviewed: I have personally reviewed following labs and imaging studies  CBC: Recent Labs  Lab 12/17/18 2032 12/18/18 0049 12/18/18 0322  WBC 8.5 8.1 7.8  NEUTROABS 4.6  --   --   HGB 14.5 13.7 13.8  HCT 45.0 43.0 44.6  MCV 111.1* 109.4* 110.4*  PLT 267 239 106   Basic Metabolic Panel: Recent Labs  Lab 12/17/18 2032 12/18/18 0049 12/18/18 0322  NA 141  --  139  K 3.9  --  3.7  CL 106  --  106  CO2 24  --  24  GLUCOSE 98  --  101*  BUN 18  --  15  CREATININE 0.98 0.98 0.81  CALCIUM 9.8  --  9.3   GFR: Estimated Creatinine Clearance: 80.9 mL/min (by C-G formula based on SCr of 0.81 mg/dL).  Liver Function Tests: Recent Labs  Lab 12/17/18 2032 12/18/18 0322  AST 46* 40  ALT 46* 43  ALKPHOS 75 77  BILITOT 0.9 1.2  PROT 7.8 7.4  ALBUMIN 3.6 3.4*   Coagulation Profile: Recent Labs  Lab 12/17/18 2032  INR 0.95   Cardiac Enzymes: Recent Labs  Lab 12/18/18 0049  TROPONINI <0.03    Anemia Panel: No results for input(s): VITAMINB12, FOLATE, FERRITIN, TIBC, IRON, RETICCTPCT in the last 72 hours. Urine analysis:    Component Value Date/Time   COLORURINE AMBER (A) 12/17/2018 2159   APPEARANCEUR CLOUDY (A) 12/17/2018 2159   LABSPEC 1.012 12/17/2018 2159   PHURINE 9.0 (H) 12/17/2018 2159   GLUCOSEU NEGATIVE 12/17/2018 2159   GLUCOSEU NEGATIVE 08/21/2015 1412   HGBUR NEGATIVE 12/17/2018 2159   BILIRUBINUR NEGATIVE 12/17/2018 2159   BILIRUBINUR negative 12/20/2017 1846   KETONESUR NEGATIVE 12/17/2018 2159   PROTEINUR 100 (A) 12/17/2018 2159   UROBILINOGEN 0.2 12/20/2017 1846   UROBILINOGEN 0.2 08/21/2015 1412   NITRITE NEGATIVE 12/17/2018 2159   LEUKOCYTESUR TRACE (A) 12/17/2018 2159   Recent Results (from the past 240 hour(s))  Urine Culture     Status: Abnormal   Collection Time: 12/17/18 11:00 PM  Result Value Ref Range Status   Specimen Description URINE, RANDOM  Final   Special Requests   Final    NONE Performed at [facility], [place] [address], [place], [place] [address]    Culture MULTIPLE SPECIES PRESENT, SUGGEST RECOLLECTION (A)  Final   Report Status 12/19/2018 FINAL  Final      Radiology Studies: No results found.  Scheduled Meds: . aspirin EC  81 mg Oral Daily  . atorvastatin  80 mg Oral q1800  . clopidogrel  75 mg Oral Daily  . enoxaparin (LOVENOX) injection  40 mg Subcutaneous Daily  . losartan  100 mg Oral Daily  . metoprolol tartrate  12.5 mg Oral BID   Continuous Infusions: . sodium chloride Stopped (12/20/18 2305)    LOS: 6 days   Time spent: 25 minutes.  Patrecia Pour, MD [facility] [email or web address] Password [facility] 12/23/2018, 12:21 PM

## 2018-12-23 NOTE — Progress Notes (Signed)
CSW called [facility] to check on insurance authorization status. CSW left Child psychotherapist.   Awaiting a return phone call. CSW will continue to follow.   Domenic Schwab, MSW, [place]

## 2018-12-23 NOTE — Progress Notes (Signed)
CSW has received insurance authorization from [place]. Patient will be able to discharge to the SNF once he is medically stable.   CSW will continue to follow.   Domenic Schwab, MSW, [place]

## 2018-12-24 DIAGNOSIS — I10 Essential (primary) hypertension: Secondary | ICD-10-CM | POA: Diagnosis not present

## 2018-12-24 DIAGNOSIS — I69320 Aphasia following cerebral infarction: Secondary | ICD-10-CM | POA: Diagnosis not present

## 2018-12-24 DIAGNOSIS — Z7401 Bed confinement status: Secondary | ICD-10-CM | POA: Diagnosis not present

## 2018-12-24 DIAGNOSIS — I63312 Cerebral infarction due to thrombosis of left middle cerebral artery: Secondary | ICD-10-CM | POA: Diagnosis not present

## 2018-12-24 DIAGNOSIS — Z720 Tobacco use: Secondary | ICD-10-CM | POA: Diagnosis not present

## 2018-12-24 DIAGNOSIS — R29818 Other symptoms and signs involving the nervous system: Secondary | ICD-10-CM | POA: Diagnosis not present

## 2018-12-24 DIAGNOSIS — Z20828 Contact with and (suspected) exposure to other viral communicable diseases: Secondary | ICD-10-CM | POA: Diagnosis not present

## 2018-12-24 DIAGNOSIS — F17200 Nicotine dependence, unspecified, uncomplicated: Secondary | ICD-10-CM | POA: Diagnosis not present

## 2018-12-24 DIAGNOSIS — F339 Major depressive disorder, recurrent, unspecified: Secondary | ICD-10-CM | POA: Diagnosis not present

## 2018-12-24 DIAGNOSIS — D638 Anemia in other chronic diseases classified elsewhere: Secondary | ICD-10-CM | POA: Diagnosis not present

## 2018-12-24 DIAGNOSIS — R404 Transient alteration of awareness: Secondary | ICD-10-CM | POA: Diagnosis not present

## 2018-12-24 DIAGNOSIS — R2681 Unsteadiness on feet: Secondary | ICD-10-CM | POA: Diagnosis not present

## 2018-12-24 DIAGNOSIS — F329 Major depressive disorder, single episode, unspecified: Secondary | ICD-10-CM | POA: Diagnosis not present

## 2018-12-24 DIAGNOSIS — M6281 Muscle weakness (generalized): Secondary | ICD-10-CM | POA: Diagnosis not present

## 2018-12-24 DIAGNOSIS — Z8551 Personal history of malignant neoplasm of bladder: Secondary | ICD-10-CM | POA: Diagnosis not present

## 2018-12-24 DIAGNOSIS — R4189 Other symptoms and signs involving cognitive functions and awareness: Secondary | ICD-10-CM | POA: Diagnosis not present

## 2018-12-24 DIAGNOSIS — M2550 Pain in unspecified joint: Secondary | ICD-10-CM | POA: Diagnosis not present

## 2018-12-24 DIAGNOSIS — N179 Acute kidney failure, unspecified: Secondary | ICD-10-CM | POA: Diagnosis not present

## 2018-12-24 DIAGNOSIS — R5381 Other malaise: Secondary | ICD-10-CM | POA: Diagnosis not present

## 2018-12-24 DIAGNOSIS — D649 Anemia, unspecified: Secondary | ICD-10-CM | POA: Diagnosis not present

## 2018-12-24 DIAGNOSIS — R41841 Cognitive communication deficit: Secondary | ICD-10-CM | POA: Diagnosis not present

## 2018-12-24 DIAGNOSIS — I471 Supraventricular tachycardia: Secondary | ICD-10-CM | POA: Diagnosis not present

## 2018-12-24 DIAGNOSIS — R2689 Other abnormalities of gait and mobility: Secondary | ICD-10-CM | POA: Diagnosis not present

## 2018-12-24 LAB — CREATININE, SERUM
Creatinine, Ser: 1.36 mg/dL — ABNORMAL HIGH (ref 0.61–1.24)
GFR calc Af Amer: 60 mL/min — ABNORMAL LOW (ref >60)
GFR calc non Af Amer: 52 mL/min — ABNORMAL LOW (ref >60)

## 2018-12-24 MED ORDER — CLOPIDOGREL BISULFATE 75 MG PO TABS: 75.0000 mg | ORAL_TABLET | Freq: Every day | ORAL | Status: DC

## 2018-12-24 MED ORDER — METOPROLOL TARTRATE 25 MG PO TABS: 12.5000 mg | ORAL_TABLET | Freq: Two times a day (BID) | ORAL | Status: DC

## 2018-12-24 MED ORDER — ATORVASTATIN CALCIUM 80 MG PO TABS: 80.0000 mg | ORAL_TABLET | Freq: Every day | ORAL | Status: DC

## 2018-12-24 NOTE — Progress Notes (Signed)
CSW called and left a message with [facility] regarding the patient. He is ready for DC and we need a room number for the patient along with the number for the nurse to report to.   CSW is awaiting a return phone call.   CSW will continue to follow.   Domenic Schwab, MSW, [place]

## 2018-12-24 NOTE — Discharge Summary (Signed)
Physician Discharge Summary  Ronald Wolf VOH:[id number] DOB: [birth year] DOA: 12/17/2018  PCP: Mosie Lukes, MD  Admit date: 12/17/2018 Discharge date: 12/24/2018  Admitted From: Home Disposition: SNF   Recommendations for Outpatient Follow-up:  1. Follow up with PCP in 1-2 weeks 2. Please obtain BMP in the next week. Holding losartan for AKI.  3. Follow up with neurology in 6-8 weeks post CVA.  Home Health: N/A Equipment/Devices: Per SNF Discharge Condition: Stable CODE STATUS: Full Diet recommendation: Heart healthy  Brief/Interim Summary: Ronald Wolf is a 73 y.o. male with a history of prior CVA, HTN, tobacco use, recurrent UTI's, alcohol abuse in remission, GERD, history of bladder CA and medical nonadherence who presented to the ED with progressive AMS and aphasia. Found to be hypertensive, having not taken BP medications regularly. Initial concern was for UTI, though MRI demonstrated acute to subacute watershed left MCA infarcts. CT angiography revealed multivessel atherosclerotic disease with mod-severe left ICA stenosis and severe M2 stenosis. High-dose statin and dual antiplatelet therapy were initiated and therapy consulted. Current recommendation is for acute rehabilitation at SNF, CSW consulted, FL2 signed.  Discharge Diagnoses:  Principal Problem:   CVA (cerebral vascular accident) ([place]) Active Problems:   HTN (hypertension)   Anemia   Smoking   UTI (urinary tract infection)   History of bladder cancer  Acute MCA territory CVA's on chronic CVD: With resultant expressive aphasia. No CES on echocardiogram. Carotid U/S with bilateral 1-39% stenosis with high level of bifurcation. HbA1c 4.2%. - Patient evaluated by Dr. Leonie Man. Due to atherosclerotic burden, pt was loaded with plavix 300mg  followed by [facility] with plans to continue aspirin indefinitely and plavix for 3 months. - With significant deficits, PT, OT recommend SNF at discharge.   - Started high intensity statin  (LDL 113) - Needs neurology follow up in 6-8 weeks.  HTN with hypertensive urgency:  - Moving out of permissive HTN window. Continued ARB at high dose, though will hold due to AKI. Pt not compliant with this as outpatient, so may need to restart at lower dose. Metop added as below. BP improved.   History of bladder CA s/p urostomy:  - Oncology follow up recommended - WOC consulted for management/supplies.   SVT: Short runs noted on telemetry.  - Started low dose metoprolol and continue monitoring, consider cardiac monitoring as outpatient, though stroke not consistent with embolic etiology.    Anemia of chronic disease: Currently hgb is stable in 13's. - Monitor intermittently. - Macrocytosis noted, though B12 and folate are wnl.  Tobacco use:  - Cessation counseling provided.   Pyuria: Urine culture nonclonal. This is not likely a true infection. No leukocytosis or fever.   Discharge Instructions Discharge Instructions    Ambulatory referral to Neurology   Complete by:  As directed    Follow up with stroke clinic NP (Jessica Vanschaick or Cecille Rubin, if both not available, consider Dr. Antony Contras, Dr. Bess Harvest, or Dr. Sarina Ill) at [facility] in about 4 weeks.  Needs neuro follow up at discharge. Anticipate he will go to SNF when d/c'd. We are signing off today     Allergies as of 12/24/2018      Reactions   Iohexol Hives    Desc: pt broke out in hives years ago from iv contrast. he was given 50mg  benadryl po, 1 hr prior to ct and did fine today w/o complications.  JB      Medication List    STOP taking  these medications   losartan 100 MG tablet Commonly known as:  COZAAR   traMADol 50 MG tablet Commonly known as:  ULTRAM     TAKE these medications   aspirin EC 81 MG tablet Take 81 mg by mouth daily.   atorvastatin 80 MG tablet Commonly known as:  LIPITOR Take 1 tablet (80 mg total) by mouth daily at 6 PM.   clopidogrel  75 MG tablet Commonly known as:  PLAVIX Take 1 tablet (75 mg total) by mouth daily.   lidocaine 2 % solution Commonly known as:  XYLOCAINE Use as directed 20 mLs in the mouth or throat as needed for mouth pain.   metoprolol tartrate 25 MG tablet Commonly known as:  LOPRESSOR Take 0.5 tablets (12.5 mg total) by mouth 2 (two) times daily.   tetrahydrozoline-zinc 0.05-0.25 % ophthalmic solution Commonly known as:  VISINE-AC Place 1 drop into both eyes daily as needed (dry eyes).       Contact information for follow-up providers    [facility] Follow up in 4 week(s).   Specialty:  Neurology Why:  stroke clinic. office will call with appt date and time. Contact information: [address] [place] [phone]           Contact information for after-discharge care    [facility] .   Service:  Skilled Nursing Contact information: [address] [address] [phone]                 Allergies  Allergen Reactions  . Iohexol Hives     Desc: pt broke out in hives years ago from iv contrast. he was given 50mg  benadryl po, 1 hr prior to ct and did fine today w/o complications.  [place]     Consultations:  Neurology  Procedures/Studies: Dg Chest 1 View  Result Date: 12/18/2018 CLINICAL DATA:  Initial evaluation for MRI clearance. EXAM: CHEST  1 VIEW COMPARISON:  Prior radiograph from 06/21/2014. FINDINGS: Cardiomegaly. Mediastinal silhouette within normal limits. Aortic atherosclerosis. Lungs mildly hypoinflated. Mild diffuse interstitial congestion without overt pulmonary edema. No pleural effusion. No consolidative opacity. No pneumothorax. No acute osseous abnormality. No radiopaque hardware or other finding to prevent MRI exam. IMPRESSION: 1. Patient clear for MRI.  No radiopaque hardware identified. 2. Cardiomegaly with mild diffuse pulmonary  interstitial congestion without overt pulmonary edema. 3. Aortic atherosclerosis. Electronically Signed   By: Jeannine Boga M.D.   On: 12/18/2018 01:56   Dg Abdomen 1 View  Result Date: 12/18/2018 CLINICAL DATA:  Initial evaluation for MRI screening clearance. EXAM: ABDOMEN - 1 VIEW COMPARISON:  Prior CT from 04/06/2013 FINDINGS: Bowel gas pattern within normal limits without obstruction or ileus. No abnormal bowel wall thickening. No soft tissue mass or abnormal calcification. Multiple surgical clips seen overlying the pelvis, stable from prior CT. Anastomotic suture noted within the right abdomen. No other radiopaque density or findings to prevent MRI exam. Extensive degenerative changes noted within the visualized spine and about the hips bilaterally. IMPRESSION: 1. Patient cleared for MRI. Multiple surgical clips overlying the pelvis with anastomotic suture within the right abdomen felt to be safe for MRI, and are stable relative to prior CT from 2013. 2. Nonobstructive bowel gas pattern Electronically Signed   By: Jeannine Boga M.D.   On: 12/18/2018 01:54   Ct Head Wo Contrast  Result Date: 12/17/2018 CLINICAL DATA:  Nonverbal since awakening from a nap  this afternoon, history hypertension, smoker EXAM: CT HEAD WITHOUT CONTRAST TECHNIQUE: Contiguous axial images were obtained from the base of the skull through the vertex without intravenous contrast. Sagittal and coronal MPR images reconstructed from axial data set. COMPARISON:  08/24/2017 FINDINGS: Brain: Generalized atrophy. Normal ventricular morphology. No midline shift or mass effect. Further evolution of LEFT posterior parietal watershed infarct seen previously. Small vessel chronic ischemic changes of deep cerebral white matter. No intracranial hemorrhage, mass lesion, or evidence of acute infarction. No extra-axial fluid collections. Vascular: Atherosclerotic calcifications of internal carotid arteries at skull base Skull: Intact  Sinuses/Orbits: Opacified RIGHT frontal sinus with air-fluid level. Remaining paranasal sinuses and mastoid air cells clear. Other: N/A IMPRESSION: Atrophy with small vessel chronic ischemic changes of deep cerebral white matter. Further evolution of previously identified LEFT posterior parietal watershed infarct. No definite new intracranial abnormalities. Question RIGHT frontal sinusitis. Electronically Signed   By: Lavonia Dana M.D.   On: 12/17/2018 21:00   Mr Brain Wo Contrast  Result Date: 12/18/2018 CLINICAL DATA:  Initial evaluation for acute TIA. EXAM: MRI HEAD WITHOUT CONTRAST MRA HEAD WITHOUT CONTRAST TECHNIQUE: Multiplanar, multiecho pulse sequences of the brain and surrounding structures were obtained without intravenous contrast. Angiographic images of the head were obtained using MRA technique without contrast. COMPARISON:  Prior CT from 12/17/2017 as well as previous MRI from 08/08/2017 and arteriogram from 09/09/2017. FINDINGS: MRI HEAD FINDINGS Brain: Examination mildly degraded by motion artifact. Moderately advanced cerebral atrophy with chronic small vessel ischemic disease. Encephalomalacia with gliosis within the left parieto-occipital region compatible with remote left posterior MCA territory infarcts and/or left MCA/PCA watershed infarcts. Associated chronic hemosiderin staining and/or laminar necrosis within this region. Intrinsic diffusion abnormality related to susceptibility artifact noted about this chronic infarct. Patchy multifocal areas of acute to early subacute ischemic infarcts seen involving the cortical gray matter and underlying hemispheric white matter of the anterior and mid left frontal lobe, left MCA territory (series 5, image 90, 80 for). Associated patchy involvement within the underlying caudate and left lentiform nuclei. Associated minimal faint petechial hemorrhage without hemorrhagic transformation (series 20, image 44). No other evidence for acute or subacute  ischemia. Gray-white matter differentiation otherwise maintained. No other evidence for acute or chronic intracranial hemorrhage. No mass lesion, midline shift or mass effect. Ventricular prominence related global parenchymal volume loss without hydrocephalus. No extra-axial fluid collection. Vascular: Major intracranial vascular flow voids maintained at the skull base. Skull and upper cervical spine: Prominent degenerative changes noted about the C1-2 articulation. Craniocervical junction otherwise unremarkable. Bone marrow signal intensity within normal limits. No scalp soft tissue abnormality. Sinuses/Orbits: Globes and orbital soft tissues within normal limits. Right fronto ethmoidal sinusitis noted. Small bilateral mastoid effusions, of doubtful significance. Inner ear structures grossly normal. Other: None. MRA HEAD FINDINGS ANTERIOR CIRCULATION: Visualized distal cervical segments of the internal carotid arteries are patent with symmetric antegrade flow. Petrous, cavernous, and supraclinoid right ICA patent to the terminus without stenosis. Petrous left ICA widely patent. Short-segment moderate approximately 50% stenosis at the left petrous-cavernous junction. Tandem moderate stenoses involving the cavernous and supraclinoid left ICA of up to approximately 50%. Left ICA terminus well perfused. Right A1 widely patent. Short-segment moderate stenosis within the mid left A1 segment (series 1028, image 9) anterior communicating artery not well assessed on this exam. Anterior cerebral arteries demonstrates mild atheromatous irregularity but are patent to their distal aspects without flow-limiting stenosis. Short-segment moderate approximate 50-70% stenosis at the mid right M1 segment (series 1028, image 8). Right  MCA otherwise patent to the bifurcation. Extensive small vessel atheromatous irregularity seen throughout the right MCA branches. Left M1 segment diffusely irregular, likely reflecting atherosclerotic  disease. There is a superimposed short-segment moderate approximate 50% stenosis at the proximal left M1 segment (series 9, image 98). Left M1 otherwise patent to the bifurcation. Short-segment moderate to severe proximal left M2 stenoses involving both the superior and inferior divisions. Extensive atheromatous irregularity distally within the left MCA branches. POSTERIOR CIRCULATION: Right vertebral artery dominant, with a diffusely hypoplastic left vertebral artery. Atheromatous irregularity throughout the V4 segments compatible with atherosclerotic change. No significant stenosis on the right. There are severe tandem stenoses involving the distal left V4 segment (series 1037, image 6). Posterior inferior cerebral arteries not seen. Diffuse atheromatous irregularity throughout the basilar artery without high-grade stenosis. Superior cerebral arteries patent bilaterally. Both of the posterior cerebral arteries primarily supplied via the basilar. Extensive atheromatous change throughout the PCAs bilaterally without high-grade stenosis. Overall, appearance of the intracranial circulation likely similar as compared to previous arteriogram. IMPRESSION: MRI HEAD IMPRESSION: 1. Patchy multifocal acute to early subacute left MCA territory infarcts involving the left frontal lobe and basal ganglia. Associated faint petechial hemorrhage without frank hemorrhagic transformation. No significant mass effect. 2. Chronic left parieto-occipital infarct 3. Underlying moderately advanced cerebral atrophy with chronic small vessel ischemic disease. 4. Right fronto ethmoidal sinusitis. MRA HEAD IMPRESSION: 1. Negative intracranial MRA for large vessel occlusion. 2. Extensive atheromatous disease throughout the intracranial circulation, with notable findings including moderate tandem cavernous/supraclinoid left ICA stenoses, moderate bilateral M1 stenoses, moderate to severe proximal left M2 branch stenoses, and severe tandem left V4  stenoses. Overall, appearance of the intracranial circulation likely relatively similar as compared to previous arteriogram from 2018. Electronically Signed   By: Jeannine Boga M.D.   On: 12/18/2018 03:27   Mr Jodene Nam Head Wo Contrast  Result Date: 12/18/2018 CLINICAL DATA:  Initial evaluation for acute TIA. EXAM: MRI HEAD WITHOUT CONTRAST MRA HEAD WITHOUT CONTRAST TECHNIQUE: Multiplanar, multiecho pulse sequences of the brain and surrounding structures were obtained without intravenous contrast. Angiographic images of the head were obtained using MRA technique without contrast. COMPARISON:  Prior CT from 12/17/2017 as well as previous MRI from 08/08/2017 and arteriogram from 09/09/2017. FINDINGS: MRI HEAD FINDINGS Brain: Examination mildly degraded by motion artifact. Moderately advanced cerebral atrophy with chronic small vessel ischemic disease. Encephalomalacia with gliosis within the left parieto-occipital region compatible with remote left posterior MCA territory infarcts and/or left MCA/PCA watershed infarcts. Associated chronic hemosiderin staining and/or laminar necrosis within this region. Intrinsic diffusion abnormality related to susceptibility artifact noted about this chronic infarct. Patchy multifocal areas of acute to early subacute ischemic infarcts seen involving the cortical gray matter and underlying hemispheric white matter of the anterior and mid left frontal lobe, left MCA territory (series 5, image 90, 80 for). Associated patchy involvement within the underlying caudate and left lentiform nuclei. Associated minimal faint petechial hemorrhage without hemorrhagic transformation (series 20, image 44). No other evidence for acute or subacute ischemia. Gray-white matter differentiation otherwise maintained. No other evidence for acute or chronic intracranial hemorrhage. No mass lesion, midline shift or mass effect. Ventricular prominence related global parenchymal volume loss without  hydrocephalus. No extra-axial fluid collection. Vascular: Major intracranial vascular flow voids maintained at the skull base. Skull and upper cervical spine: Prominent degenerative changes noted about the C1-2 articulation. Craniocervical junction otherwise unremarkable. Bone marrow signal intensity within normal limits. No scalp soft tissue abnormality. Sinuses/Orbits: Globes and orbital soft tissues within  normal limits. Right fronto ethmoidal sinusitis noted. Small bilateral mastoid effusions, of doubtful significance. Inner ear structures grossly normal. Other: None. MRA HEAD FINDINGS ANTERIOR CIRCULATION: Visualized distal cervical segments of the internal carotid arteries are patent with symmetric antegrade flow. Petrous, cavernous, and supraclinoid right ICA patent to the terminus without stenosis. Petrous left ICA widely patent. Short-segment moderate approximately 50% stenosis at the left petrous-cavernous junction. Tandem moderate stenoses involving the cavernous and supraclinoid left ICA of up to approximately 50%. Left ICA terminus well perfused. Right A1 widely patent. Short-segment moderate stenosis within the mid left A1 segment (series 1028, image 9) anterior communicating artery not well assessed on this exam. Anterior cerebral arteries demonstrates mild atheromatous irregularity but are patent to their distal aspects without flow-limiting stenosis. Short-segment moderate approximate 50-70% stenosis at the mid right M1 segment (series 1028, image 8). Right MCA otherwise patent to the bifurcation. Extensive small vessel atheromatous irregularity seen throughout the right MCA branches. Left M1 segment diffusely irregular, likely reflecting atherosclerotic disease. There is a superimposed short-segment moderate approximate 50% stenosis at the proximal left M1 segment (series 9, image 98). Left M1 otherwise patent to the bifurcation. Short-segment moderate to severe proximal left M2 stenoses involving  both the superior and inferior divisions. Extensive atheromatous irregularity distally within the left MCA branches. POSTERIOR CIRCULATION: Right vertebral artery dominant, with a diffusely hypoplastic left vertebral artery. Atheromatous irregularity throughout the V4 segments compatible with atherosclerotic change. No significant stenosis on the right. There are severe tandem stenoses involving the distal left V4 segment (series 1037, image 6). Posterior inferior cerebral arteries not seen. Diffuse atheromatous irregularity throughout the basilar artery without high-grade stenosis. Superior cerebral arteries patent bilaterally. Both of the posterior cerebral arteries primarily supplied via the basilar. Extensive atheromatous change throughout the PCAs bilaterally without high-grade stenosis. Overall, appearance of the intracranial circulation likely similar as compared to previous arteriogram. IMPRESSION: MRI HEAD IMPRESSION: 1. Patchy multifocal acute to early subacute left MCA territory infarcts involving the left frontal lobe and basal ganglia. Associated faint petechial hemorrhage without frank hemorrhagic transformation. No significant mass effect. 2. Chronic left parieto-occipital infarct 3. Underlying moderately advanced cerebral atrophy with chronic small vessel ischemic disease. 4. Right fronto ethmoidal sinusitis. MRA HEAD IMPRESSION: 1. Negative intracranial MRA for large vessel occlusion. 2. Extensive atheromatous disease throughout the intracranial circulation, with notable findings including moderate tandem cavernous/supraclinoid left ICA stenoses, moderate bilateral M1 stenoses, moderate to severe proximal left M2 branch stenoses, and severe tandem left V4 stenoses. Overall, appearance of the intracranial circulation likely relatively similar as compared to previous arteriogram from 2018. Electronically Signed   By: Jeannine Boga M.D.   On: 12/18/2018 03:27   Vas US Carotid (at [facility]  Only)  Result Date: 12/19/2018 Carotid Arterial Duplex Study Indications:   CVA and Speech disturbance. Risk Factors:  Hypertension, prior CVA. Other Factors: Non compliant with medications.  Performing Technologist: Toma Copier RVS  Examination Guidelines: A complete evaluation includes B-mode imaging, spectral Doppler, color Doppler, and power Doppler as needed of all accessible portions of each vessel. Bilateral testing is considered an integral part of a complete examination. Limited examinations for reoccurring indications may be performed as noted.  Right Carotid Findings: +----------+--------+--------+--------+------------+--------------------+           PSV cm/sEDV cm/sStenosisDescribe    Comments             +----------+--------+--------+--------+------------+--------------------+ CCA Prox  75      4  mild intimal changes +----------+--------+--------+--------+------------+--------------------+ CCA Distal63      9                           mild intimal changes +----------+--------+--------+--------+------------+--------------------+ ICA Prox  32      7               heterogenousmild plaque          +----------+--------+--------+--------+------------+--------------------+ ICA Mid   67      14                          tortuous             +----------+--------+--------+--------+------------+--------------------+ ICA Distal76      16                          tortuous             +----------+--------+--------+--------+------------+--------------------+ ECA       112     12              heterogenousmild plaque          +----------+--------+--------+--------+------------+--------------------+ +----------+--------+-------+--------+-------------------+           PSV cm/sEDV cmsDescribeArm Pressure (mmHG) +----------+--------+-------+--------+-------------------+ [id number]                                          +----------+--------+-------+--------+-------------------+ +---------+--------+--+--------+-+ VertebralPSV cm/s46EDV cm/s8 +---------+--------+--+--------+-+ 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  Left Carotid Findings: +----------+--------+--------+--------+------------+---------------------------+           PSV cm/sEDV cm/sStenosisDescribe    Comments                    +----------+--------+--------+--------+------------+---------------------------+ CCA Prox  84      3                           mild intimal changes        +----------+--------+--------+--------+------------+---------------------------+ CCA Distal61      9                           mild intimal changes        +----------+--------+--------+--------+------------+---------------------------+ ICA Prox  50      10              heterogenousmild plaque                 +----------+--------+--------+--------+------------+---------------------------+ ICA Mid   86      19              heterogenousNot visualized              +----------+--------+--------+--------+------------+---------------------------+ ICA Distal                                    unable to visualize the                                                   most distal ICA due  to high                                               bifurcation]                +----------+--------+--------+--------+------------+---------------------------+ ECA       36      5               heterogenousmild plaque                 +----------+--------+--------+--------+------------+---------------------------+ +----------+--------+--------+--------+-------------------+ SubclavianPSV cm/sEDV cm/sDescribeArm Pressure (mmHG) +----------+--------+--------+--------+-------------------+           151                                         +----------+--------+--------+--------+-------------------+  +---------+--------+--+--------+-+ VertebralPSV cm/s34EDV cm/s6 +---------+--------+--+--------+-+ Technically difficult due to 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  Summary: Right Carotid: Velocities in the right ICA are consistent with a 1-39% stenosis.                See technician notes listed above. Left Carotid: Velocities in the left ICA are consistent with a 1-39% stenosis.               See technician notes listed above. Vertebrals:  Bilateral vertebral arteries demonstrate antegrade flow. Subclavians: Normal flow hemodynamics were seen in bilateral subclavian              arteries. *See table(s) above for measurements and observations.  Electronically signed by Antony Contras MD on 12/19/2018 at 2:05:41 PM.    Final      Subjective: No new complaints. Working with PT, frustrated at delay in [employer].  Discharge Exam: Vitals:   12/24/18 0324 12/24/18 0750  BP: 137/88 (!) 146/61  Pulse: (!) 55 61  Resp: 18 20  Temp: 98 F (36.7 C) 98.1 F (36.7 C)  SpO2: 100% 100%   General: Pt is alert, awake, not in acute distress Cardiovascular: RRR, S1/S2 +, no rubs, no gallops Respiratory: CTA bilaterally, no wheezing, no rhonchi Abdominal: Soft, NT, ND, bowel sounds + Extremities: No edema, no cyanosis Neuro: Alert, oriented, delayed speech and processing, right field visual deficits suspected. No numbness or weakness x4 extremities.  Labs: Basic Metabolic Panel: Recent Labs  Lab 12/17/18 2032 12/18/18 0049 12/18/18 0322 12/24/18 0531  NA 141  --  139  --   K 3.9  --  3.7  --   CL 106  --  106  --   CO2 24  --  24  --   GLUCOSE 98  --  101*  --   BUN 18  --  15  --   CREATININE 0.98 0.98 0.81 1.36*  CALCIUM 9.8  --  9.3  --    Liver Function Tests: Recent Labs  Lab 12/17/18 2032 12/18/18 0322  AST 46* 40  ALT 46* 43  ALKPHOS 75 77  BILITOT 0.9 1.2  PROT 7.8 7.4  ALBUMIN 3.6 3.4*   CBC: Recent Labs  Lab 12/17/18 2032 12/18/18 0049  12/18/18 0322  WBC 8.5 8.1 7.8  NEUTROABS 4.6  --   --   HGB 14.5 13.7 13.8  HCT 45.0 43.0 44.6  MCV 111.1* 109.4* 110.4*  PLT 267 239 244  Cardiac Enzymes: Recent Labs  Lab 12/18/18 0049  TROPONINI <0.03   Urinalysis    Component Value Date/Time   COLORURINE AMBER (A) 12/17/2018 2159   APPEARANCEUR CLOUDY (A) 12/17/2018 2159   LABSPEC 1.012 12/17/2018 2159   PHURINE 9.0 (H) 12/17/2018 2159   GLUCOSEU NEGATIVE 12/17/2018 2159   GLUCOSEU NEGATIVE 08/21/2015 1412   HGBUR NEGATIVE 12/17/2018 2159   BILIRUBINUR NEGATIVE 12/17/2018 2159   BILIRUBINUR negative 12/20/2017 1846   KETONESUR NEGATIVE 12/17/2018 2159   PROTEINUR 100 (A) 12/17/2018 2159   UROBILINOGEN 0.2 12/20/2017 1846   UROBILINOGEN 0.2 08/21/2015 1412   NITRITE NEGATIVE 12/17/2018 2159   LEUKOCYTESUR TRACE (A) 12/17/2018 2159    Microbiology Recent Results (from the past 240 hour(s))  Urine Culture     Status: Abnormal   Collection Time: 12/17/18 11:00 PM  Result Value Ref Range Status   Specimen Description URINE, RANDOM  Final   Special Requests   Final    NONE Performed at [facility], [place] [address] [place], [place] [address]    Culture MULTIPLE SPECIES PRESENT, SUGGEST RECOLLECTION (A)  Final   Report Status 12/19/2018 FINAL  Final    Time coordinating discharge: Approximately 40 minutes  Patrecia Pour, MD  [facility] 12/24/2018, 8:21 AM Pager [phone]

## 2018-12-24 NOTE — Progress Notes (Signed)
P.t d/c from unit via [facility].  Soil scientist on person. Clothes packed up in personal bag and transported with p.t. P.t. daughter called. And updated.

## 2018-12-24 NOTE — Clinical Social Work Placement (Signed)
Nurse to call and report to [phone] and will report to room 312.   CLINICAL SOCIAL WORK PLACEMENT  NOTE  Date:  12/24/2018  Patient Details  Name: Ronald Wolf MRN: [id number] Date of Birth: 1946-05-14  Clinical Social Work is seeking post-discharge placement for this patient at the [place] level of care (*CSW will initial, date and re-position this form in  chart as items are completed):  Yes   Patient/family provided with [facility] Work Department's list of facilities offering this level of care within the geographic area requested by the patient (or if unable, by the patient's family).  Yes   Patient/family informed of their freedom to choose among providers that offer the needed level of care, that participate in Medicare, Medicaid or managed care program needed by the patient, have an available bed and are willing to accept the patient.  Yes   Patient/family informed of [facility]'s ownership interest in [facility] and [facility], as well as of the fact that they are under no obligation to receive care at these facilities.  PASRR submitted to EDS on       PASRR number received on 12/19/18     Existing PASRR number confirmed on       FL2 transmitted to all facilities in geographic area requested by pt/family on       FL2 transmitted to all facilities within larger geographic area on       Patient informed that his/her managed care company has contracts with or will negotiate with certain facilities, including the following:        Yes   Patient/family informed of bed offers received.  Patient chooses bed at [facility] recommends and patient chooses bed at      Patient to be transferred to [facility] on 12/24/18.  Patient to be transferred to facility by [facility]     Patient family notified on 12/24/18 of transfer.  Name of family member notified:  Derrick Ravel, Daughter      PHYSICIAN       Additional Comment:    _______________________________________________ Gelene Mink, [place] 12/24/2018, 10:58 AM

## 2018-12-25 ENCOUNTER — Non-Acute Institutional Stay (SKILLED_NURSING_FACILITY): Payer: Medicare PPO | Admitting: Adult Health

## 2018-12-25 ENCOUNTER — Encounter: Payer: Self-pay | Admitting: Adult Health

## 2018-12-25 DIAGNOSIS — Z720 Tobacco use: Secondary | ICD-10-CM

## 2018-12-25 DIAGNOSIS — I471 Supraventricular tachycardia: Secondary | ICD-10-CM | POA: Diagnosis not present

## 2018-12-25 DIAGNOSIS — N179 Acute kidney failure, unspecified: Secondary | ICD-10-CM | POA: Diagnosis not present

## 2018-12-25 DIAGNOSIS — I63312 Cerebral infarction due to thrombosis of left middle cerebral artery: Secondary | ICD-10-CM

## 2018-12-25 DIAGNOSIS — Z8551 Personal history of malignant neoplasm of bladder: Secondary | ICD-10-CM | POA: Diagnosis not present

## 2018-12-25 DIAGNOSIS — I10 Essential (primary) hypertension: Secondary | ICD-10-CM

## 2018-12-25 NOTE — Progress Notes (Signed)
Location:  [facility] Room Number: 270-W Place of Service:  SNF (31) Provider:  Durenda Age, NP  Patient Care Team: Mosie Lukes, MD as PCP - General (Family Medicine)  Extended Emergency Contact Information Primary Emergency Contact: [place] Mobile Phone: [phone] Relation: Daughter  Code Status:  Full Code  Goals of care: Advanced Directive information Advanced Directives 09/09/2017  Does Patient Have a Medical Advance Directive? No     Chief Complaint  Patient presents with  . Acute Visit    Hospital followup status post    HPI:  Pt is a 73 y.o. male seen today for hospital followup.  He was admitted to [facility] for short-term rehabilitation following an admission at [facility] 1/5-1/12/19 for CVA. He has a PMH of CVA, HTN, tobacco use, recurrent UTIs, alcohol abuse in remission, GERD, history of bladder CA, and medical nonadherence.  He was having progressive AMS and aphasia.  He was found to be hypertensive, having not taken BP medications on a regular basis.  MRI demonstrated acute to subacute watershed left MCA infarcts.  CT angiography revealed multivessel atherosclerotic disease with moderate to severe left ICA stenosis and severe M2 stenosis.  High-dose statin and dual antiplatelet therapy were initiated. He was evaluated by Dr. Leonie Man, due to atherosclerotic burden, he was given Plavix 300 mg followed by DAPT with plans to continue Aspirin indefinitely and Plavix for 3 months.  He was seen in his room today with speech therapist at bedside. He is verbally responsive but disoriented to time and place.   Past Medical History:  Diagnosis Date  . Abdominal pain, other specified site 09/26/2013  . Anxiety   . Arthritis   . Atherosclerosis of arteries 05/13/2013  . Cough 09/26/2013  . Depression   . Eating disorder   . GERD (gastroesophageal reflux disease)   . Hepatitis C   .  Hypertension   . Lesion of liver 04/01/2013  . Loss of weight 06/21/2014  . Medial knee pain 09/26/2013  . Palpitations 06/21/2014  . Shortness of breath dyspnea    with exertion   Past Surgical History:  Procedure Laterality Date  . IR ANGIO INTRA EXTRACRAN SEL COM CAROTID INNOMINATE BILAT MOD SED  09/09/2017  . IR ANGIO VERTEBRAL SEL VERTEBRAL BILAT MOD SED  09/09/2017  . IR RADIOLOGIST EVAL & MGMT  09/07/2017  . MICROLARYNGOSCOPY WITH CO2 LASER AND EXCISION OF VOCAL CORD LESION N/A 05/14/2016   Procedure: MICROLARYNGOSCOPY WITH CO2 LASER AND EXCISION OF VOCAL CORD LESION;  Surgeon: Melida Quitter, MD;  Location: [facility];  Service: ENT;  Laterality: N/A;  Suspended micro laryngoscopy with CO2 laser excision of vocal fold lesion  . TUMOR REMOVAL  1990   bladder    Allergies  Allergen Reactions  . Iohexol Hives     Desc: pt broke out in hives years ago from iv contrast. he was given 50mg  benadryl po, 1 hr prior to ct and did fine today w/o complications.  JB     Outpatient Encounter Medications as of 12/25/2018  Medication Sig  . aspirin EC 81 MG tablet Take 81 mg by mouth daily.  Marland Kitchen atorvastatin (LIPITOR) 80 MG tablet Take 1 tablet (80 mg total) by mouth daily at 6 PM.  . clopidogrel (PLAVIX) 75 MG tablet Take 1 tablet (75 mg total) by mouth daily.  Marland Kitchen lidocaine (XYLOCAINE) 2 % solution Use as directed 20 mLs in the mouth or throat as needed for mouth  pain.  . metoprolol tartrate (LOPRESSOR) 25 MG tablet Take 0.5 tablets (12.5 mg total) by mouth 2 (two) times daily.  Marland Kitchen tetrahydrozoline-zinc (VISINE-AC) 0.05-0.25 % ophthalmic solution Place 1 drop into both eyes daily as needed (dry eyes).    No facility-administered encounter medications on file as of 12/25/2018.     Review of Systems  GENERAL: No change in appetite, no fatigue, no weight changes, no fever, chills or weakness MOUTH and THROAT: Denies oral discomfort, gingival pain or bleeding RESPIRATORY: no cough, SOB, DOE, wheezing,  hemoptysis CARDIAC: No chest pain, edema or palpitations GI: No abdominal pain, diarrhea, constipation, heart burn, nausea or vomiting NEUROLOGICAL: Denies dizziness, syncope, numbness, or headache PSYCHIATRIC: Denies feelings of depression or anxiety. No report of hallucinations, insomnia, paranoia, or agitation   Immunization History  Administered Date(s) Administered  . Influenza Split 09/14/2012  . Influenza, High Dose Seasonal PF 09/24/2013, 09/05/2015, 12/20/2017  . Influenza,inj,Quad PF,6+ Mos 10/23/2014  . Pneumococcal Conjugate-13 10/23/2014   Pertinent  Health Maintenance Due  Topic Date Due  . PNA vac Low Risk Adult (2 of 2 - PPSV23) 10/24/2015  . INFLUENZA VACCINE  07/13/2018  . COLONOSCOPY  11/17/2019   Fall Risk  12/20/2017 03/08/2016 10/23/2014 10/23/2014  Falls in the past year? No Yes No No  Number falls in past yr: - 1 - -  Injury with Fall? - No - -  Risk for fall due to : - - - History of fall(s)    Vitals:   12/25/18 1225  BP: (!) 141/80  Pulse: 64  Resp: 20  Temp: (!) 97.3 F (36.3 C)  TempSrc: Oral  SpO2: 100%  Weight: 153 lb (69.4 kg)  Height: 5\' 11"  (1.803 m)   Body mass index is 21.34 kg/m.  Physical Exam  GENERAL APPEARANCE: Well nourished. In no acute distress. Normal body habitus SKIN:  Skin is warm and dry.  MOUTH and THROAT: Lips are without lesions. Oral mucosa is moist and without lesions.  RESPIRATORY: Breathing is even & unlabored, BS CTAB CARDIAC: RRR, no murmur,no extra heart sounds, no edema GI: Abdomen soft, normal BS, no masses, no tenderness GU:  Right urostomy attached to urine bag  EXTREMITIES:  Able to move X 4 extremities NEUROLOGICAL: There is no tremor. Speech is clear. Alert to self, disoriented to time and place. PSYCHIATRIC:  Affect and behavior are appropriate   Labs reviewed: Recent Labs    12/17/18 2032 12/18/18 0049 12/18/18 0322 12/24/18 0531  NA 141  --  139  --   K 3.9  --  3.7  --   CL 106  --  106   --   CO2 24  --  24  --   GLUCOSE 98  --  101*  --   BUN 18  --  15  --   CREATININE 0.98 0.98 0.81 1.36*  CALCIUM 9.8  --  9.3  --    Recent Labs    12/17/18 2032 12/18/18 0322  AST 46* 40  ALT 46* 43  ALKPHOS 75 77  BILITOT 0.9 1.2  PROT 7.8 7.4  ALBUMIN 3.6 3.4*   Recent Labs    12/17/18 2032 12/18/18 0049 12/18/18 0322  WBC 8.5 8.1 7.8  NEUTROABS 4.6  --   --   HGB 14.5 13.7 13.8  HCT 45.0 43.0 44.6  MCV 111.1* 109.4* 110.4*  PLT 267 239 244   Lab Results  Component Value Date   TSH 1.46 12/20/2017   Lab Results  Component Value Date   HGBA1C 4.2 (L) 12/18/2018   Lab Results  Component Value Date   CHOL 178 12/18/2018   HDL 41 12/18/2018   LDLCALC 113 (H) 12/18/2018   TRIG 119 12/18/2018   CHOLHDL 4.3 12/18/2018    Significant Diagnostic Results in last 30 days:  Dg Chest 1 View  Result Date: 12/18/2018 CLINICAL DATA:  Initial evaluation for MRI clearance. EXAM: CHEST  1 VIEW COMPARISON:  Prior radiograph from 06/21/2014. FINDINGS: Cardiomegaly. Mediastinal silhouette within normal limits. Aortic atherosclerosis. Lungs mildly hypoinflated. Mild diffuse interstitial congestion without overt pulmonary edema. No pleural effusion. No consolidative opacity. No pneumothorax. No acute osseous abnormality. No radiopaque hardware or other finding to prevent MRI exam. IMPRESSION: 1. Patient clear for MRI.  No radiopaque hardware identified. 2. Cardiomegaly with mild diffuse pulmonary interstitial congestion without overt pulmonary edema. 3. Aortic atherosclerosis. Electronically Signed   By: Jeannine Boga M.D.   On: 12/18/2018 01:56   Dg Abdomen 1 View  Result Date: 12/18/2018 CLINICAL DATA:  Initial evaluation for MRI screening clearance. EXAM: ABDOMEN - 1 VIEW COMPARISON:  Prior CT from 04/06/2013 FINDINGS: Bowel gas pattern within normal limits without obstruction or ileus. No abnormal bowel wall thickening. No soft tissue mass or abnormal calcification.  Multiple surgical clips seen overlying the pelvis, stable from prior CT. Anastomotic suture noted within the right abdomen. No other radiopaque density or findings to prevent MRI exam. Extensive degenerative changes noted within the visualized spine and about the hips bilaterally. IMPRESSION: 1. Patient cleared for MRI. Multiple surgical clips overlying the pelvis with anastomotic suture within the right abdomen felt to be safe for MRI, and are stable relative to prior CT from 2013. 2. Nonobstructive bowel gas pattern Electronically Signed   By: Jeannine Boga M.D.   On: 12/18/2018 01:54   Ct Head Wo Contrast  Result Date: 12/17/2018 CLINICAL DATA:  Nonverbal since awakening from a nap this afternoon, history hypertension, smoker EXAM: CT HEAD WITHOUT CONTRAST TECHNIQUE: Contiguous axial images were obtained from the base of the skull through the vertex without intravenous contrast. Sagittal and coronal MPR images reconstructed from axial data set. COMPARISON:  08/24/2017 FINDINGS: Brain: Generalized atrophy. Normal ventricular morphology. No midline shift or mass effect. Further evolution of LEFT posterior parietal watershed infarct seen previously. Small vessel chronic ischemic changes of deep cerebral white matter. No intracranial hemorrhage, mass lesion, or evidence of acute infarction. No extra-axial fluid collections. Vascular: Atherosclerotic calcifications of internal carotid arteries at skull base Skull: Intact Sinuses/Orbits: Opacified RIGHT frontal sinus with air-fluid level. Remaining paranasal sinuses and mastoid air cells clear. Other: N/A IMPRESSION: Atrophy with small vessel chronic ischemic changes of deep cerebral white matter. Further evolution of previously identified LEFT posterior parietal watershed infarct. No definite new intracranial abnormalities. Question RIGHT frontal sinusitis. Electronically Signed   By: Lavonia Dana M.D.   On: 12/17/2018 21:00   Mr Brain Wo Contrast  Result  Date: 12/18/2018 CLINICAL DATA:  Initial evaluation for acute TIA. EXAM: MRI HEAD WITHOUT CONTRAST MRA HEAD WITHOUT CONTRAST TECHNIQUE: Multiplanar, multiecho pulse sequences of the brain and surrounding structures were obtained without intravenous contrast. Angiographic images of the head were obtained using MRA technique without contrast. COMPARISON:  Prior CT from 12/17/2017 as well as previous MRI from 08/08/2017 and arteriogram from 09/09/2017. FINDINGS: MRI HEAD FINDINGS Brain: Examination mildly degraded by motion artifact. Moderately advanced cerebral atrophy with chronic small vessel ischemic disease. Encephalomalacia with gliosis within the left parieto-occipital region compatible with remote left  posterior MCA territory infarcts and/or left MCA/PCA watershed infarcts. Associated chronic hemosiderin staining and/or laminar necrosis within this region. Intrinsic diffusion abnormality related to susceptibility artifact noted about this chronic infarct. Patchy multifocal areas of acute to early subacute ischemic infarcts seen involving the cortical gray matter and underlying hemispheric white matter of the anterior and mid left frontal lobe, left MCA territory (series 5, image 90, 80 for). Associated patchy involvement within the underlying caudate and left lentiform nuclei. Associated minimal faint petechial hemorrhage without hemorrhagic transformation (series 20, image 44). No other evidence for acute or subacute ischemia. Gray-white matter differentiation otherwise maintained. No other evidence for acute or chronic intracranial hemorrhage. No mass lesion, midline shift or mass effect. Ventricular prominence related global parenchymal volume loss without hydrocephalus. No extra-axial fluid collection. Vascular: Major intracranial vascular flow voids maintained at the skull base. Skull and upper cervical spine: Prominent degenerative changes noted about the C1-2 articulation. Craniocervical junction  otherwise unremarkable. Bone marrow signal intensity within normal limits. No scalp soft tissue abnormality. Sinuses/Orbits: Globes and orbital soft tissues within normal limits. Right fronto ethmoidal sinusitis noted. Small bilateral mastoid effusions, of doubtful significance. Inner ear structures grossly normal. Other: None. MRA HEAD FINDINGS ANTERIOR CIRCULATION: Visualized distal cervical segments of the internal carotid arteries are patent with symmetric antegrade flow. Petrous, cavernous, and supraclinoid right ICA patent to the terminus without stenosis. Petrous left ICA widely patent. Short-segment moderate approximately 50% stenosis at the left petrous-cavernous junction. Tandem moderate stenoses involving the cavernous and supraclinoid left ICA of up to approximately 50%. Left ICA terminus well perfused. Right A1 widely patent. Short-segment moderate stenosis within the mid left A1 segment (series 1028, image 9) anterior communicating artery not well assessed on this exam. Anterior cerebral arteries demonstrates mild atheromatous irregularity but are patent to their distal aspects without flow-limiting stenosis. Short-segment moderate approximate 50-70% stenosis at the mid right M1 segment (series 1028, image 8). Right MCA otherwise patent to the bifurcation. Extensive small vessel atheromatous irregularity seen throughout the right MCA branches. Left M1 segment diffusely irregular, likely reflecting atherosclerotic disease. There is a superimposed short-segment moderate approximate 50% stenosis at the proximal left M1 segment (series 9, image 98). Left M1 otherwise patent to the bifurcation. Short-segment moderate to severe proximal left M2 stenoses involving both the superior and inferior divisions. Extensive atheromatous irregularity distally within the left MCA branches. POSTERIOR CIRCULATION: Right vertebral artery dominant, with a diffusely hypoplastic left vertebral artery. Atheromatous  irregularity throughout the V4 segments compatible with atherosclerotic change. No significant stenosis on the right. There are severe tandem stenoses involving the distal left V4 segment (series 1037, image 6). Posterior inferior cerebral arteries not seen. Diffuse atheromatous irregularity throughout the basilar artery without high-grade stenosis. Superior cerebral arteries patent bilaterally. Both of the posterior cerebral arteries primarily supplied via the basilar. Extensive atheromatous change throughout the PCAs bilaterally without high-grade stenosis. Overall, appearance of the intracranial circulation likely similar as compared to previous arteriogram. IMPRESSION: MRI HEAD IMPRESSION: 1. Patchy multifocal acute to early subacute left MCA territory infarcts involving the left frontal lobe and basal ganglia. Associated faint petechial hemorrhage without frank hemorrhagic transformation. No significant mass effect. 2. Chronic left parieto-occipital infarct 3. Underlying moderately advanced cerebral atrophy with chronic small vessel ischemic disease. 4. Right fronto ethmoidal sinusitis. MRA HEAD IMPRESSION: 1. Negative intracranial MRA for large vessel occlusion. 2. Extensive atheromatous disease throughout the intracranial circulation, with notable findings including moderate tandem cavernous/supraclinoid left ICA stenoses, moderate bilateral M1 stenoses, moderate to severe proximal  left M2 branch stenoses, and severe tandem left V4 stenoses. Overall, appearance of the intracranial circulation likely relatively similar as compared to previous arteriogram from 2018. Electronically Signed   By: Jeannine Boga M.D.   On: 12/18/2018 03:27   Mr Jodene Nam Head Wo Contrast  Result Date: 12/18/2018 CLINICAL DATA:  Initial evaluation for acute TIA. EXAM: MRI HEAD WITHOUT CONTRAST MRA HEAD WITHOUT CONTRAST TECHNIQUE: Multiplanar, multiecho pulse sequences of the brain and surrounding structures were obtained without  intravenous contrast. Angiographic images of the head were obtained using MRA technique without contrast. COMPARISON:  Prior CT from 12/17/2017 as well as previous MRI from 08/08/2017 and arteriogram from 09/09/2017. FINDINGS: MRI HEAD FINDINGS Brain: Examination mildly degraded by motion artifact. Moderately advanced cerebral atrophy with chronic small vessel ischemic disease. Encephalomalacia with gliosis within the left parieto-occipital region compatible with remote left posterior MCA territory infarcts and/or left MCA/PCA watershed infarcts. Associated chronic hemosiderin staining and/or laminar necrosis within this region. Intrinsic diffusion abnormality related to susceptibility artifact noted about this chronic infarct. Patchy multifocal areas of acute to early subacute ischemic infarcts seen involving the cortical gray matter and underlying hemispheric white matter of the anterior and mid left frontal lobe, left MCA territory (series 5, image 90, 80 for). Associated patchy involvement within the underlying caudate and left lentiform nuclei. Associated minimal faint petechial hemorrhage without hemorrhagic transformation (series 20, image 44). No other evidence for acute or subacute ischemia. Gray-white matter differentiation otherwise maintained. No other evidence for acute or chronic intracranial hemorrhage. No mass lesion, midline shift or mass effect. Ventricular prominence related global parenchymal volume loss without hydrocephalus. No extra-axial fluid collection. Vascular: Major intracranial vascular flow voids maintained at the skull base. Skull and upper cervical spine: Prominent degenerative changes noted about the C1-2 articulation. Craniocervical junction otherwise unremarkable. Bone marrow signal intensity within normal limits. No scalp soft tissue abnormality. Sinuses/Orbits: Globes and orbital soft tissues within normal limits. Right fronto ethmoidal sinusitis noted. Small bilateral mastoid  effusions, of doubtful significance. Inner ear structures grossly normal. Other: None. MRA HEAD FINDINGS ANTERIOR CIRCULATION: Visualized distal cervical segments of the internal carotid arteries are patent with symmetric antegrade flow. Petrous, cavernous, and supraclinoid right ICA patent to the terminus without stenosis. Petrous left ICA widely patent. Short-segment moderate approximately 50% stenosis at the left petrous-cavernous junction. Tandem moderate stenoses involving the cavernous and supraclinoid left ICA of up to approximately 50%. Left ICA terminus well perfused. Right A1 widely patent. Short-segment moderate stenosis within the mid left A1 segment (series 1028, image 9) anterior communicating artery not well assessed on this exam. Anterior cerebral arteries demonstrates mild atheromatous irregularity but are patent to their distal aspects without flow-limiting stenosis. Short-segment moderate approximate 50-70% stenosis at the mid right M1 segment (series 1028, image 8). Right MCA otherwise patent to the bifurcation. Extensive small vessel atheromatous irregularity seen throughout the right MCA branches. Left M1 segment diffusely irregular, likely reflecting atherosclerotic disease. There is a superimposed short-segment moderate approximate 50% stenosis at the proximal left M1 segment (series 9, image 98). Left M1 otherwise patent to the bifurcation. Short-segment moderate to severe proximal left M2 stenoses involving both the superior and inferior divisions. Extensive atheromatous irregularity distally within the left MCA branches. POSTERIOR CIRCULATION: Right vertebral artery dominant, with a diffusely hypoplastic left vertebral artery. Atheromatous irregularity throughout the V4 segments compatible with atherosclerotic change. No significant stenosis on the right. There are severe tandem stenoses involving the distal left V4 segment (series 1037, image 6). Posterior inferior cerebral  arteries not  seen. Diffuse atheromatous irregularity throughout the basilar artery without high-grade stenosis. Superior cerebral arteries patent bilaterally. Both of the posterior cerebral arteries primarily supplied via the basilar. Extensive atheromatous change throughout the PCAs bilaterally without high-grade stenosis. Overall, appearance of the intracranial circulation likely similar as compared to previous arteriogram. IMPRESSION: MRI HEAD IMPRESSION: 1. Patchy multifocal acute to early subacute left MCA territory infarcts involving the left frontal lobe and basal ganglia. Associated faint petechial hemorrhage without frank hemorrhagic transformation. No significant mass effect. 2. Chronic left parieto-occipital infarct 3. Underlying moderately advanced cerebral atrophy with chronic small vessel ischemic disease. 4. Right fronto ethmoidal sinusitis. MRA HEAD IMPRESSION: 1. Negative intracranial MRA for large vessel occlusion. 2. Extensive atheromatous disease throughout the intracranial circulation, with notable findings including moderate tandem cavernous/supraclinoid left ICA stenoses, moderate bilateral M1 stenoses, moderate to severe proximal left M2 branch stenoses, and severe tandem left V4 stenoses. Overall, appearance of the intracranial circulation likely relatively similar as compared to previous arteriogram from 2018. Electronically Signed   By: Jeannine Boga M.D.   On: 12/18/2018 03:27   Vas US Carotid (at [facility] Only)  Result Date: 12/19/2018 Carotid Arterial Duplex Study Indications:   CVA and Speech disturbance. Risk Factors:  Hypertension, prior CVA. Other Factors: Non compliant with medications.  Performing Technologist: Toma Copier RVS  Examination Guidelines: A complete evaluation includes B-mode imaging, spectral Doppler, color Doppler, and power Doppler as needed of all accessible portions of each vessel. Bilateral testing is considered an integral part of a complete examination.  Limited examinations for reoccurring indications may be performed as noted.  Right Carotid Findings: +----------+--------+--------+--------+------------+--------------------+           PSV cm/sEDV cm/sStenosisDescribe    Comments             +----------+--------+--------+--------+------------+--------------------+ CCA Prox  75      4                           mild intimal changes +----------+--------+--------+--------+------------+--------------------+ CCA Distal63      9                           mild intimal changes +----------+--------+--------+--------+------------+--------------------+ ICA Prox  32      7               heterogenousmild plaque          +----------+--------+--------+--------+------------+--------------------+ ICA Mid   67      14                          tortuous             +----------+--------+--------+--------+------------+--------------------+ ICA Distal76      16                          tortuous             +----------+--------+--------+--------+------------+--------------------+ ECA       112     12              heterogenousmild plaque          +----------+--------+--------+--------+------------+--------------------+ +----------+--------+-------+--------+-------------------+           PSV cm/sEDV cmsDescribeArm Pressure (mmHG) +----------+--------+-------+--------+-------------------+ [id number]                                         +----------+--------+-------+--------+-------------------+ +---------+--------+--+--------+-+  VertebralPSV cm/s46EDV cm/s8 +---------+--------+--+--------+-+ 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  Left Carotid Findings: +----------+--------+--------+--------+------------+---------------------------+           PSV cm/sEDV cm/sStenosisDescribe    Comments                    +----------+--------+--------+--------+------------+---------------------------+  CCA Prox  84      3                           mild intimal changes        +----------+--------+--------+--------+------------+---------------------------+ CCA Distal61      9                           mild intimal changes        +----------+--------+--------+--------+------------+---------------------------+ ICA Prox  50      10              heterogenousmild plaque                 +----------+--------+--------+--------+------------+---------------------------+ ICA Mid   86      19              heterogenousNot visualized              +----------+--------+--------+--------+------------+---------------------------+ ICA Distal                                    unable to visualize the                                                   most distal ICA due to high                                               bifurcation]                +----------+--------+--------+--------+------------+---------------------------+ ECA       36      5               heterogenousmild plaque                 +----------+--------+--------+--------+------------+---------------------------+ +----------+--------+--------+--------+-------------------+ SubclavianPSV cm/sEDV cm/sDescribeArm Pressure (mmHG) +----------+--------+--------+--------+-------------------+           151                                         +----------+--------+--------+--------+-------------------+ +---------+--------+--+--------+-+ VertebralPSV cm/s34EDV cm/s6 +---------+--------+--+--------+-+ Technically difficult due to 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  Summary: Right Carotid: Velocities in the right ICA are consistent with a 1-39% stenosis.                See technician notes listed above. Left Carotid: Velocities in the left ICA are consistent with a 1-39% stenosis.               See technician notes listed above. Vertebrals:  Bilateral vertebral arteries demonstrate antegrade flow.  Subclavians: Normal flow hemodynamics were seen in bilateral subclavian  arteries. *See table(s) above for measurements and observations.  Electronically signed by Antony Contras MD on 12/19/2018 at 2:05:41 PM.    Final     Assessment/Plan  1. Cerebrovascular accident (CVA) due to thrombosis of left middle cerebral artery ([place]) -  MRI demonstrated acute to subacute watershed left MCA infarcts.  CT angiography revealed multivessel atherosclerotic disease with moderate to severe left ICA stenosis and severe M2 stenosis; for PT and OT, for therapeutic strengthening exercises, continue aspirin EC 81 mg 1 tab daily indefinitely, and Plavix 75 mg 1 tab daily x3 months, atorvastatin 80 mg 1 tab every 6 p.m., follow-up with neurology in 4 weeks   2. Essential hypertension -  continue metoprolol tartrate 25 mg 1 tab twice daily, BP/HR BID X 1 week, ARB on hold due to AKI   3. History of bladder cancer -S/P urostomy, follow-up with oncology   4. SVT (supraventricular tachycardia) (HCC) - rate-controlled, continue metoprolol tartrate 25 mg 1 tab twice daily   5. Tobacco use - counseled, will start on Nicotine patch   6. AKI - ARB on hold due to AKI, will repeat BMP in 1 week Lab Results  Component Value Date   CREATININE 1.36 (H) 12/24/2018      Family/ staff Communication:  Discussed plan of care with patient.  Labs/tests ordered:  BMP in 1 week  Goals of care:   Short-term rehabilitation.   Durenda Age, NP [facility] [phone] (Monday-Friday 8:00 a.m. - 5:00 p.m.) [phone] (after hours)

## 2018-12-26 ENCOUNTER — Non-Acute Institutional Stay (SKILLED_NURSING_FACILITY): Payer: Medicare PPO | Admitting: Internal Medicine

## 2018-12-26 ENCOUNTER — Encounter: Payer: Self-pay | Admitting: Internal Medicine

## 2018-12-26 DIAGNOSIS — I10 Essential (primary) hypertension: Secondary | ICD-10-CM | POA: Diagnosis not present

## 2018-12-26 DIAGNOSIS — R4189 Other symptoms and signs involving cognitive functions and awareness: Secondary | ICD-10-CM | POA: Diagnosis not present

## 2018-12-26 DIAGNOSIS — R29818 Other symptoms and signs involving the nervous system: Secondary | ICD-10-CM | POA: Diagnosis not present

## 2018-12-26 DIAGNOSIS — I63312 Cerebral infarction due to thrombosis of left middle cerebral artery: Secondary | ICD-10-CM | POA: Diagnosis not present

## 2018-12-26 NOTE — Progress Notes (Signed)
NURSING HOME LOCATION:  [facility] ROOM NUMBER:  [id number]  CODE STATUS:  Full Code  PCP:  Mosie Lukes, MD  [address] [place] [id number]  This is a comprehensive admission note to [facility] performed on this date less than 30 days from date of admission. Included are preadmission medical/surgical history; reconciled medication list; family history; social history and comprehensive review of systems.  Corrections and additions to the records were documented. Comprehensive physical exam was also performed. Additionally a clinical summary was entered for each active diagnosis pertinent to this admission in the Problem List to enhance continuity of care.  HPI: The patient was hospitalized 1/5-1/11/2019 having presented from home to the ED with progressive acute mental status changes and aphasia.  He was found to be hypertensive in the context of noncompliance with his antihypertensive medications.   MRI revealed acute to subacute watershed left MCA infarcts.  CT angiography revealed multivessel atherosclerotic disease with moderate-severe left ICA stenosis and severe M2 stenosis.  Neurology, Dr. Leonie Man consulted.  High-dose statin and dual antiplatelet therapy were initiated because of atherosclerotic burden.  Plavix was to be continued for 3 months and aspirin indefinitely. Hypertensive urgency was treated initially with permissive hypertension.High-dose ARB was held due to AKI.  As noted the patient has been noncompliant with this regimen.  Metoprolol was added. He had intermittent short runs of SVT.  The stroke was not felt to be consistent with embolic etiology. Urinalysis revealed pyuria ; urine culture revealed multiple species. No fever or leukocytosis noted. He had anemia attributed to chronic disease.  Hemoglobin was stable.  Macrocytosis was present, B12 and folate are normal.  Rehab at the SNF was recommended.  Neurology follow-up in 6-8 weeks was  recommended.  Past medical and surgical history: History of bladder cancer, status post urostomy.  Medical history also includes history of hepatitis C, GERD, depression, anxiety. Other surgeries include excision of a vocal cord lesion.  Social history: Patient was an active smoker at the time of presentation with his strokes.  Cessation counseling was provided.  He states he started smoking at age 52.  History of alcohol abuse  Family history: Reviewed   Review of systems: Responses are typically monosyllabic and "no".  Responses are questionable as he changes his history.  He was able to give the day of the week but stated it was 2000.  He could not name the president.BIMS testing revealed a score of 8.  Physical exam:  Pertinent or positive findings: Affect is markedly flat and he has masked facies. Responses are slow and monosyllabic as noted.  Head is shaven.  He has a mustache.  Arcus senilis is present.  Eyebrows are essentially absent.  He is edentulous.  Heart sounds are distant and heard best in the epigastrium.  Breath sounds are also markedly distant with a suggestion of minor scattered rhonchi.  He exhibits an intermittent deep cough which is nonproductive.  Ostomy present right lower quadrant.  Pedal pulses are decreased, especially dorsalis pedis pulses.  Clubbing of the nailbeds is present.  Although strength appears good to opposition in the extremities; he required 1-2 person assistant to sit up.  General appearance: Adequately nourished; no acute distress, increased work of breathing is present.   Lymphatic: No lymphadenopathy about the head, neck, axilla. Eyes: No conjunctival inflammation or lid edema is present. There is no scleral icterus. Ears:  External ear exam shows no significant lesions or deformities.  Nose:  External nasal examination shows no deformity or inflammation. Nasal mucosa are pink and moist without lesions, exudates Oral exam: Lips and gums are healthy  appearing.There is no oropharyngeal erythema or exudate. Neck:  No thyromegaly, masses, tenderness noted.    Heart:  No gallop, murmur, click, rub.  Abdomen: Bowel sounds are normal.  Abdomen is soft and nontender with no organomegaly, hernias, masses. GU: Deferred  Extremities:  No cyanosis, edema. Neurologic exam: Balance, Rhomberg, finger to nose testing could not be completed due to clinical state Skin: Warm & dry w/o tenting. No significant lesions or rash.  See clinical summary under each active problem in the Problem List with associated updated therapeutic plan

## 2018-12-26 NOTE — Patient Instructions (Signed)
See assessment and plan under each diagnosis in the problem list and acutely for this visit 

## 2018-12-26 NOTE — Assessment & Plan Note (Signed)
BP controlled; no change in antihypertensive medications  

## 2018-12-26 NOTE — Assessment & Plan Note (Signed)
PT/OT at [facility] Neurology follow-up 6-8 weeks

## 2018-12-26 NOTE — Assessment & Plan Note (Addendum)
Additional testing as indicated per Neurology

## 2019-01-09 ENCOUNTER — Encounter: Payer: Self-pay | Admitting: Adult Health

## 2019-01-09 ENCOUNTER — Non-Acute Institutional Stay (SKILLED_NURSING_FACILITY): Payer: Medicare PPO | Admitting: Adult Health

## 2019-01-09 DIAGNOSIS — I63312 Cerebral infarction due to thrombosis of left middle cerebral artery: Secondary | ICD-10-CM | POA: Diagnosis not present

## 2019-01-09 NOTE — Progress Notes (Signed)
Location:  [place] Room Number: 643-P Place of Service:  SNF (31) Provider:  Durenda Age, NP  Patient Care Team: Mosie Lukes, MD as PCP - General (Family Medicine)  Extended Emergency Contact Information Primary Emergency Contact: [place] Mobile Phone: [phone] Relation: Daughter  Code Status:  Full Code  Goals of care: Advanced Directive information Advanced Directives 09/09/2017  Does Patient Have a Medical Advance Directive? No     Chief Complaint  Patient presents with  . Acute Visit    Patient with right sided neglect, seen for possible extension of stroke.    HPI:   Pt is a 73 y.o. male seen today for an acute visit due to concerns of the speech therapist that he has right-sided neglect and concern for extension of his CVA. Yesterday, he was noted to be walking into the wall on his right side while walking with a walker. MRI head done on 12/18/18 showed patchy multifocal acute to early subacute left MCA territory infarcts involving the left frontal lobe and basal ganglia; associated faint petechial hemorrhage without frank hemorrhagic transformation; has chronic left parieto-occipital infarct.   He was an inpatient at [facility] 1/5-1/11/2019 for a CVA. High-dose statin and dual antiplatelet therapy were initiated. He was evaluated by Dr. Leonie Man and was given Plavix 300 mg followed by DAPT with plans to continue Aspirin indefinitely and Plavix for 3 months. He is a short-term rehabilitation resident of [facility].  He has a PMH of CVA, HTN, tobacco use, recurrent UTIs, alcohol abuse in remission, GERD, history of bladder CA, and medical nonadherence.      Past Medical History:  Diagnosis Date  . Abdominal pain, other specified site 09/26/2013  . Anxiety   . Arthritis   . Atherosclerosis of arteries 05/13/2013  . Cough 09/26/2013  . Depression   . Eating disorder   . GERD  (gastroesophageal reflux disease)   . Hepatitis C   . Hypertension   . Lesion of liver 04/01/2013  . Loss of weight 06/21/2014  . Medial knee pain 09/26/2013  . Palpitations 06/21/2014  . Shortness of breath dyspnea    with exertion   Past Surgical History:  Procedure Laterality Date  . IR ANGIO INTRA EXTRACRAN SEL COM CAROTID INNOMINATE BILAT MOD SED  09/09/2017  . IR ANGIO VERTEBRAL SEL VERTEBRAL BILAT MOD SED  09/09/2017  . IR RADIOLOGIST EVAL & MGMT  09/07/2017  . MICROLARYNGOSCOPY WITH CO2 LASER AND EXCISION OF VOCAL CORD LESION N/A 05/14/2016   Procedure: MICROLARYNGOSCOPY WITH CO2 LASER AND EXCISION OF VOCAL CORD LESION;  Surgeon: Melida Quitter, MD;  Location: [place];  Service: ENT;  Laterality: N/A;  Suspended micro laryngoscopy with CO2 laser excision of vocal fold lesion  . TUMOR REMOVAL  1990   bladder    Allergies  Allergen Reactions  . Iohexol Hives     Desc: pt broke out in hives years ago from iv contrast. he was given 50mg  benadryl po, 1 hr prior to ct and did fine today w/o complications.  JB     Outpatient Encounter Medications as of 01/09/2019  Medication Sig  . aspirin EC 81 MG tablet Take 81 mg by mouth daily.  Marland Kitchen atorvastatin (LIPITOR) 80 MG tablet Take 1 tablet (80 mg total) by mouth daily at 6 PM.  . clopidogrel (PLAVIX) 75 MG tablet Take 1 tablet (75 mg total) by mouth daily.  Marland Kitchen lidocaine (XYLOCAINE) 2 % solution Use as directed  20 mLs in the mouth or throat as needed for mouth pain.  . metoprolol tartrate (LOPRESSOR) 25 MG tablet Take 0.5 tablets (12.5 mg total) by mouth 2 (two) times daily.  . nicotine (NICODERM CQ - DOSED IN MG/24 HOURS) 21 mg/24hr patch Place 21 mg onto the skin daily.  Marland Kitchen NUTRITIONAL SUPPLEMENT LIQD Take 120 mLs by mouth daily.  . pseudoephedrine-dextromethorphan-guaifenesin (ROBITUSSIN-PE) 30-10-100 MG/5ML solution Take 10 mLs by mouth 3 (three) times daily.  . sertraline (ZOLOFT) 50 MG tablet Take 50 mg by mouth daily.  Marland Kitchen  tetrahydrozoline-zinc (VISINE-AC) 0.05-0.25 % ophthalmic solution Place 1 drop into both eyes daily as needed (dry eyes).    No facility-administered encounter medications on file as of 01/09/2019.     Review of Systems  GENERAL: No change in appetite, no fatigue, no weight changes, no fever, chills or weakness MOUTH and THROAT: Denies oral discomfort, gingival pain or bleeding, pain from teeth or hoarseness   RESPIRATORY: no cough, SOB, DOE, wheezing, hemoptysis CARDIAC: No chest pain, edema or palpitations GI: No abdominal pain, diarrhea, constipation, heart burn, nausea or vomiting NEUROLOGICAL: Denies dizziness, syncope, numbness, or headache PSYCHIATRIC: Denies feelings of depression or anxiety. No report of hallucinations, insomnia, paranoia, or agitation   Immunization History  Administered Date(s) Administered  . Influenza Split 09/14/2012  . Influenza, High Dose Seasonal PF 09/24/2013, 09/05/2015, 12/20/2017  . Influenza,inj,Quad PF,6+ Mos 10/23/2014  . Pneumococcal Conjugate-13 10/23/2014   Pertinent  Health Maintenance Due  Topic Date Due  . PNA vac Low Risk Adult (2 of 2 - PPSV23) 10/24/2015  . INFLUENZA VACCINE  07/13/2018  . COLONOSCOPY  11/17/2019   Fall Risk  12/20/2017 03/08/2016 10/23/2014 10/23/2014  Falls in the past year? No Yes No No  Number falls in past yr: - 1 - -  Injury with Fall? - No - -  Risk for fall due to : - - - History of fall(s)     Vitals:   01/09/19 1435  BP: 125/79  Pulse: 93  Resp: 18  Temp: (!) 97.5 F (36.4 C)  TempSrc: Oral  SpO2: 98%  Weight: 147 lb 6.4 oz (66.9 kg)  Height: 5\' 11"  (1.803 m)   Body mass index is 20.56 kg/m.  Physical Exam  GENERAL APPEARANCE: Well nourished. In no acute distress. Normal body habitus SKIN:  Skin is warm and dry.  MOUTH and THROAT: Lips are without lesions. Oral mucosa is moist and without lesions.  RESPIRATORY: Breathing is even & unlabored, BS CTAB CARDIAC: RRR, no murmur,no extra heart  sounds, no edema GI: Abdomen soft, normal BS, no masses, no tenderness GU:  Right urostomy draining to urine bag with yellowish urine. EXTREMITIES:  Able to move X 4 extremities NEUROLOGICAL: There is no tremor. Speech is clear. Has right lateral deficit. Has receptive aphasia PSYCHIATRIC: Affect and behavior are appropriate   Labs reviewed: Recent Labs    12/17/18 2032 12/18/18 0049 12/18/18 0322 12/24/18 0531  NA 141  --  139  --   K 3.9  --  3.7  --   CL 106  --  106  --   CO2 24  --  24  --   GLUCOSE 98  --  101*  --   BUN 18  --  15  --   CREATININE 0.98 0.98 0.81 1.36*  CALCIUM 9.8  --  9.3  --    Recent Labs    12/17/18 2032 12/18/18 0322  AST 46* 40  ALT 46* 43  ALKPHOS 75 77  BILITOT 0.9 1.2  PROT 7.8 7.4  ALBUMIN 3.6 3.4*   Recent Labs    12/17/18 2032 12/18/18 0049 12/18/18 0322  WBC 8.5 8.1 7.8  NEUTROABS 4.6  --   --   HGB 14.5 13.7 13.8  HCT 45.0 43.0 44.6  MCV 111.1* 109.4* 110.4*  PLT 267 239 244   Lab Results  Component Value Date   TSH 1.46 12/20/2017   Lab Results  Component Value Date   HGBA1C 4.2 (L) 12/18/2018   Lab Results  Component Value Date   CHOL 178 12/18/2018   HDL 41 12/18/2018   LDLCALC 113 (H) 12/18/2018   TRIG 119 12/18/2018   CHOLHDL 4.3 12/18/2018    Significant Diagnostic Results in last 30 days:  Dg Chest 1 View  Result Date: 12/18/2018 CLINICAL DATA:  Initial evaluation for MRI clearance. EXAM: CHEST  1 VIEW COMPARISON:  Prior radiograph from 06/21/2014. FINDINGS: Cardiomegaly. Mediastinal silhouette within normal limits. Aortic atherosclerosis. Lungs mildly hypoinflated. Mild diffuse interstitial congestion without overt pulmonary edema. No pleural effusion. No consolidative opacity. No pneumothorax. No acute osseous abnormality. No radiopaque hardware or other finding to prevent MRI exam. IMPRESSION: 1. Patient clear for MRI.  No radiopaque hardware identified. 2. Cardiomegaly with mild diffuse pulmonary  interstitial congestion without overt pulmonary edema. 3. Aortic atherosclerosis. Electronically Signed   By: Jeannine Boga M.D.   On: 12/18/2018 01:56   Dg Abdomen 1 View  Result Date: 12/18/2018 CLINICAL DATA:  Initial evaluation for MRI screening clearance. EXAM: ABDOMEN - 1 VIEW COMPARISON:  Prior CT from 04/06/2013 FINDINGS: Bowel gas pattern within normal limits without obstruction or ileus. No abnormal bowel wall thickening. No soft tissue mass or abnormal calcification. Multiple surgical clips seen overlying the pelvis, stable from prior CT. Anastomotic suture noted within the right abdomen. No other radiopaque density or findings to prevent MRI exam. Extensive degenerative changes noted within the visualized spine and about the hips bilaterally. IMPRESSION: 1. Patient cleared for MRI. Multiple surgical clips overlying the pelvis with anastomotic suture within the right abdomen felt to be safe for MRI, and are stable relative to prior CT from 2013. 2. Nonobstructive bowel gas pattern Electronically Signed   By: Jeannine Boga M.D.   On: 12/18/2018 01:54   Ct Head Wo Contrast  Result Date: 12/17/2018 CLINICAL DATA:  Nonverbal since awakening from a nap this afternoon, history hypertension, smoker EXAM: CT HEAD WITHOUT CONTRAST TECHNIQUE: Contiguous axial images were obtained from the base of the skull through the vertex without intravenous contrast. Sagittal and coronal MPR images reconstructed from axial data set. COMPARISON:  08/24/2017 FINDINGS: Brain: Generalized atrophy. Normal ventricular morphology. No midline shift or mass effect. Further evolution of LEFT posterior parietal watershed infarct seen previously. Small vessel chronic ischemic changes of deep cerebral white matter. No intracranial hemorrhage, mass lesion, or evidence of acute infarction. No extra-axial fluid collections. Vascular: Atherosclerotic calcifications of internal carotid arteries at skull base Skull: Intact  Sinuses/Orbits: Opacified RIGHT frontal sinus with air-fluid level. Remaining paranasal sinuses and mastoid air cells clear. Other: N/A IMPRESSION: Atrophy with small vessel chronic ischemic changes of deep cerebral white matter. Further evolution of previously identified LEFT posterior parietal watershed infarct. No definite new intracranial abnormalities. Question RIGHT frontal sinusitis. Electronically Signed   By: Lavonia [place] M.D.   On: 12/17/2018 21:00   Mr Brain Wo Contrast  Result Date: 12/18/2018 CLINICAL DATA:  Initial evaluation for acute TIA. EXAM: MRI HEAD WITHOUT CONTRAST MRA HEAD  WITHOUT CONTRAST TECHNIQUE: Multiplanar, multiecho pulse sequences of the brain and surrounding structures were obtained without intravenous contrast. Angiographic images of the head were obtained using MRA technique without contrast. COMPARISON:  Prior CT from 12/17/2017 as well as previous MRI from 08/08/2017 and arteriogram from 09/09/2017. FINDINGS: MRI HEAD FINDINGS Brain: Examination mildly degraded by motion artifact. Moderately advanced cerebral atrophy with chronic small vessel ischemic disease. Encephalomalacia with gliosis within the left parieto-occipital region compatible with remote left posterior MCA territory infarcts and/or left MCA/PCA watershed infarcts. Associated chronic hemosiderin staining and/or laminar necrosis within this region. Intrinsic diffusion abnormality related to susceptibility artifact noted about this chronic infarct. Patchy multifocal areas of acute to early subacute ischemic infarcts seen involving the cortical gray matter and underlying hemispheric white matter of the anterior and mid left frontal lobe, left MCA territory (series 5, image 90, 80 for). Associated patchy involvement within the underlying caudate and left lentiform nuclei. Associated minimal faint petechial hemorrhage without hemorrhagic transformation (series 20, image 44). No other evidence for acute or subacute  ischemia. Gray-white matter differentiation otherwise maintained. No other evidence for acute or chronic intracranial hemorrhage. No mass lesion, midline shift or mass effect. Ventricular prominence related global parenchymal volume loss without hydrocephalus. No extra-axial fluid collection. Vascular: Major intracranial vascular flow voids maintained at the skull base. Skull and upper cervical spine: Prominent degenerative changes noted about the C1-2 articulation. Craniocervical junction otherwise unremarkable. Bone marrow signal intensity within normal limits. No scalp soft tissue abnormality. Sinuses/Orbits: Globes and orbital soft tissues within normal limits. Right fronto ethmoidal sinusitis noted. Small bilateral mastoid effusions, of doubtful significance. Inner ear structures grossly normal. Other: None. MRA HEAD FINDINGS ANTERIOR CIRCULATION: Visualized distal cervical segments of the internal carotid arteries are patent with symmetric antegrade flow. Petrous, cavernous, and supraclinoid right ICA patent to the terminus without stenosis. Petrous left ICA widely patent. Short-segment moderate approximately 50% stenosis at the left petrous-cavernous junction. Tandem moderate stenoses involving the cavernous and supraclinoid left ICA of up to approximately 50%. Left ICA terminus well perfused. Right A1 widely patent. Short-segment moderate stenosis within the mid left A1 segment (series 1028, image 9) anterior communicating artery not well assessed on this exam. Anterior cerebral arteries demonstrates mild atheromatous irregularity but are patent to their distal aspects without flow-limiting stenosis. Short-segment moderate approximate 50-70% stenosis at the mid right M1 segment (series 1028, image 8). Right MCA otherwise patent to the bifurcation. Extensive small vessel atheromatous irregularity seen throughout the right MCA branches. Left M1 segment diffusely irregular, likely reflecting atherosclerotic  disease. There is a superimposed short-segment moderate approximate 50% stenosis at the proximal left M1 segment (series 9, image 98). Left M1 otherwise patent to the bifurcation. Short-segment moderate to severe proximal left M2 stenoses involving both the superior and inferior divisions. Extensive atheromatous irregularity distally within the left MCA branches. POSTERIOR CIRCULATION: Right vertebral artery dominant, with a diffusely hypoplastic left vertebral artery. Atheromatous irregularity throughout the V4 segments compatible with atherosclerotic change. No significant stenosis on the right. There are severe tandem stenoses involving the distal left V4 segment (series 1037, image 6). Posterior inferior cerebral arteries not seen. Diffuse atheromatous irregularity throughout the basilar artery without high-grade stenosis. Superior cerebral arteries patent bilaterally. Both of the posterior cerebral arteries primarily supplied via the basilar. Extensive atheromatous change throughout the PCAs bilaterally without high-grade stenosis. Overall, appearance of the intracranial circulation likely similar as compared to previous arteriogram. IMPRESSION: MRI HEAD IMPRESSION: 1. Patchy multifocal acute to early subacute left MCA territory  infarcts involving the left frontal lobe and basal ganglia. Associated faint petechial hemorrhage without frank hemorrhagic transformation. No significant mass effect. 2. Chronic left parieto-occipital infarct 3. Underlying moderately advanced cerebral atrophy with chronic small vessel ischemic disease. 4. Right fronto ethmoidal sinusitis. MRA HEAD IMPRESSION: 1. Negative intracranial MRA for large vessel occlusion. 2. Extensive atheromatous disease throughout the intracranial circulation, with notable findings including moderate tandem cavernous/supraclinoid left ICA stenoses, moderate bilateral M1 stenoses, moderate to severe proximal left M2 branch stenoses, and severe tandem left V4  stenoses. Overall, appearance of the intracranial circulation likely relatively similar as compared to previous arteriogram from 2018. Electronically Signed   By: Jeannine Boga M.D.   On: 12/18/2018 03:27   Mr Jodene Nam Head Wo Contrast  Result Date: 12/18/2018 CLINICAL DATA:  Initial evaluation for acute TIA. EXAM: MRI HEAD WITHOUT CONTRAST MRA HEAD WITHOUT CONTRAST TECHNIQUE: Multiplanar, multiecho pulse sequences of the brain and surrounding structures were obtained without intravenous contrast. Angiographic images of the head were obtained using MRA technique without contrast. COMPARISON:  Prior CT from 12/17/2017 as well as previous MRI from 08/08/2017 and arteriogram from 09/09/2017. FINDINGS: MRI HEAD FINDINGS Brain: Examination mildly degraded by motion artifact. Moderately advanced cerebral atrophy with chronic small vessel ischemic disease. Encephalomalacia with gliosis within the left parieto-occipital region compatible with remote left posterior MCA territory infarcts and/or left MCA/PCA watershed infarcts. Associated chronic hemosiderin staining and/or laminar necrosis within this region. Intrinsic diffusion abnormality related to susceptibility artifact noted about this chronic infarct. Patchy multifocal areas of acute to early subacute ischemic infarcts seen involving the cortical gray matter and underlying hemispheric white matter of the anterior and mid left frontal lobe, left MCA territory (series 5, image 90, 80 for). Associated patchy involvement within the underlying caudate and left lentiform nuclei. Associated minimal faint petechial hemorrhage without hemorrhagic transformation (series 20, image 44). No other evidence for acute or subacute ischemia. Gray-white matter differentiation otherwise maintained. No other evidence for acute or chronic intracranial hemorrhage. No mass lesion, midline shift or mass effect. Ventricular prominence related global parenchymal volume loss without  hydrocephalus. No extra-axial fluid collection. Vascular: Major intracranial vascular flow voids maintained at the skull base. Skull and upper cervical spine: Prominent degenerative changes noted about the C1-2 articulation. Craniocervical junction otherwise unremarkable. Bone marrow signal intensity within normal limits. No scalp soft tissue abnormality. Sinuses/Orbits: Globes and orbital soft tissues within normal limits. Right fronto ethmoidal sinusitis noted. Small bilateral mastoid effusions, of doubtful significance. Inner ear structures grossly normal. Other: None. MRA HEAD FINDINGS ANTERIOR CIRCULATION: Visualized distal cervical segments of the internal carotid arteries are patent with symmetric antegrade flow. Petrous, cavernous, and supraclinoid right ICA patent to the terminus without stenosis. Petrous left ICA widely patent. Short-segment moderate approximately 50% stenosis at the left petrous-cavernous junction. Tandem moderate stenoses involving the cavernous and supraclinoid left ICA of up to approximately 50%. Left ICA terminus well perfused. Right A1 widely patent. Short-segment moderate stenosis within the mid left A1 segment (series 1028, image 9) anterior communicating artery not well assessed on this exam. Anterior cerebral arteries demonstrates mild atheromatous irregularity but are patent to their distal aspects without flow-limiting stenosis. Short-segment moderate approximate 50-70% stenosis at the mid right M1 segment (series 1028, image 8). Right MCA otherwise patent to the bifurcation. Extensive small vessel atheromatous irregularity seen throughout the right MCA branches. Left M1 segment diffusely irregular, likely reflecting atherosclerotic disease. There is a superimposed short-segment moderate approximate 50% stenosis at the proximal left M1 segment (series 9,  image 98). Left M1 otherwise patent to the bifurcation. Short-segment moderate to severe proximal left M2 stenoses involving  both the superior and inferior divisions. Extensive atheromatous irregularity distally within the left MCA branches. POSTERIOR CIRCULATION: Right vertebral artery dominant, with a diffusely hypoplastic left vertebral artery. Atheromatous irregularity throughout the V4 segments compatible with atherosclerotic change. No significant stenosis on the right. There are severe tandem stenoses involving the distal left V4 segment (series 1037, image 6). Posterior inferior cerebral arteries not seen. Diffuse atheromatous irregularity throughout the basilar artery without high-grade stenosis. Superior cerebral arteries patent bilaterally. Both of the posterior cerebral arteries primarily supplied via the basilar. Extensive atheromatous change throughout the PCAs bilaterally without high-grade stenosis. Overall, appearance of the intracranial circulation likely similar as compared to previous arteriogram. IMPRESSION: MRI HEAD IMPRESSION: 1. Patchy multifocal acute to early subacute left MCA territory infarcts involving the left frontal lobe and basal ganglia. Associated faint petechial hemorrhage without frank hemorrhagic transformation. No significant mass effect. 2. Chronic left parieto-occipital infarct 3. Underlying moderately advanced cerebral atrophy with chronic small vessel ischemic disease. 4. Right fronto ethmoidal sinusitis. MRA HEAD IMPRESSION: 1. Negative intracranial MRA for large vessel occlusion. 2. Extensive atheromatous disease throughout the intracranial circulation, with notable findings including moderate tandem cavernous/supraclinoid left ICA stenoses, moderate bilateral M1 stenoses, moderate to severe proximal left M2 branch stenoses, and severe tandem left V4 stenoses. Overall, appearance of the intracranial circulation likely relatively similar as compared to previous arteriogram from 2018. Electronically Signed   By: Jeannine Boga M.D.   On: 12/18/2018 03:27   Vas US Carotid (at [facility]  Only)  Result Date: 12/19/2018 Carotid Arterial Duplex Study Indications:   CVA and Speech disturbance. Risk Factors:  Hypertension, prior CVA. Other Factors: Non compliant with medications.  Performing Technologist: Toma Copier RVS  Examination Guidelines: A complete evaluation includes B-mode imaging, spectral Doppler, color Doppler, and power Doppler as needed of all accessible portions of each vessel. Bilateral testing is considered an integral part of a complete examination. Limited examinations for reoccurring indications may be performed as noted.  Right Carotid Findings: +----------+--------+--------+--------+------------+--------------------+           PSV cm/sEDV cm/sStenosisDescribe    Comments             +----------+--------+--------+--------+------------+--------------------+ CCA Prox  75      4                           mild intimal changes +----------+--------+--------+--------+------------+--------------------+ CCA Distal63      9                           mild intimal changes +----------+--------+--------+--------+------------+--------------------+ ICA Prox  32      7               heterogenousmild plaque          +----------+--------+--------+--------+------------+--------------------+ ICA Mid   67      14                          tortuous             +----------+--------+--------+--------+------------+--------------------+ ICA Distal76      16                          tortuous             +----------+--------+--------+--------+------------+--------------------+  ECA       112     12              heterogenousmild plaque          +----------+--------+--------+--------+------------+--------------------+ +----------+--------+-------+--------+-------------------+           PSV cm/sEDV cmsDescribeArm Pressure (mmHG) +----------+--------+-------+--------+-------------------+ [id number]                                          +----------+--------+-------+--------+-------------------+ +---------+--------+--+--------+-+ VertebralPSV cm/s46EDV cm/s8 +---------+--------+--+--------+-+ 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  Left Carotid Findings: +----------+--------+--------+--------+------------+---------------------------+           PSV cm/sEDV cm/sStenosisDescribe    Comments                    +----------+--------+--------+--------+------------+---------------------------+ CCA Prox  84      3                           mild intimal changes        +----------+--------+--------+--------+------------+---------------------------+ CCA Distal61      9                           mild intimal changes        +----------+--------+--------+--------+------------+---------------------------+ ICA Prox  50      10              heterogenousmild plaque                 +----------+--------+--------+--------+------------+---------------------------+ ICA Mid   86      19              heterogenousNot visualized              +----------+--------+--------+--------+------------+---------------------------+ ICA Distal                                    unable to visualize the                                                   most distal ICA due to high                                               bifurcation]                +----------+--------+--------+--------+------------+---------------------------+ ECA       36      5               heterogenousmild plaque                 +----------+--------+--------+--------+------------+---------------------------+ +----------+--------+--------+--------+-------------------+ SubclavianPSV cm/sEDV cm/sDescribeArm Pressure (mmHG) +----------+--------+--------+--------+-------------------+           151                                         +----------+--------+--------+--------+-------------------+  +---------+--------+--+--------+-+ VertebralPSV cm/s34EDV  cm/s6 +---------+--------+--+--------+-+ Technically difficult due to 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  Summary: Right Carotid: Velocities in the right ICA are consistent with a 1-39% stenosis.                See technician notes listed above. Left Carotid: Velocities in the left ICA are consistent with a 1-39% stenosis.               See technician notes listed above. Vertebrals:  Bilateral vertebral arteries demonstrate antegrade flow. Subclavians: Normal flow hemodynamics were seen in bilateral subclavian              arteries. *See table(s) above for measurements and observations.  Electronically signed by Antony Contras MD on 12/19/2018 at 2:05:41 PM.    Final     Assessment/Plan  1. Cerebrovascular accident (CVA) due to thrombosis of left middle cerebral artery ([place]) -  Noted to have right lateral deficit, had been noted to run on the wall with therapists, will continue Plavix 75 mg daily, ASA 81 mg daily, Atorvastatin 80 mg Q 6 PM, will get neurology consult   Family/ staff Communication: Discussed plan of care with resident.  Labs/tests ordered:  None  Goals of care:   Short-term rehaiblitation.   Durenda Age, NP [facility] [phone] (Monday-Friday 8:00 a.m. - 5:00 p.m.) [phone] (after hours)

## 2019-01-12 ENCOUNTER — Non-Acute Institutional Stay (SKILLED_NURSING_FACILITY): Payer: Medicare PPO | Admitting: Adult Health

## 2019-01-12 ENCOUNTER — Encounter: Payer: Self-pay | Admitting: Adult Health

## 2019-01-12 DIAGNOSIS — I471 Supraventricular tachycardia: Secondary | ICD-10-CM | POA: Diagnosis not present

## 2019-01-12 DIAGNOSIS — Z720 Tobacco use: Secondary | ICD-10-CM

## 2019-01-12 DIAGNOSIS — I63312 Cerebral infarction due to thrombosis of left middle cerebral artery: Secondary | ICD-10-CM

## 2019-01-12 DIAGNOSIS — Z20828 Contact with and (suspected) exposure to other viral communicable diseases: Secondary | ICD-10-CM | POA: Diagnosis not present

## 2019-01-12 DIAGNOSIS — F339 Major depressive disorder, recurrent, unspecified: Secondary | ICD-10-CM

## 2019-01-12 DIAGNOSIS — I10 Essential (primary) hypertension: Secondary | ICD-10-CM

## 2019-01-12 MED ORDER — METOPROLOL TARTRATE 25 MG PO TABS: 12.5000 mg | ORAL_TABLET | Freq: Two times a day (BID) | ORAL | 0 refills | Status: DC

## 2019-01-12 MED ORDER — SERTRALINE HCL 50 MG PO TABS: 50.0000 mg | ORAL_TABLET | Freq: Every day | ORAL | 0 refills | Status: DC

## 2019-01-12 MED ORDER — CLOPIDOGREL BISULFATE 75 MG PO TABS: 75.0000 mg | ORAL_TABLET | Freq: Every day | ORAL | 0 refills | Status: DC

## 2019-01-12 MED ORDER — NICOTINE 21 MG/24HR TD PT24: 21.0000 mg | MEDICATED_PATCH | Freq: Every day | TRANSDERMAL | 0 refills | Status: AC

## 2019-01-12 MED ORDER — OSELTAMIVIR PHOSPHATE 75 MG PO CAPS: 75.0000 mg | ORAL_CAPSULE | Freq: Every day | ORAL | 0 refills | Status: AC

## 2019-01-12 MED ORDER — ATORVASTATIN CALCIUM 80 MG PO TABS: 80.0000 mg | ORAL_TABLET | Freq: Every day | ORAL | 0 refills | Status: DC

## 2019-01-12 MED ORDER — LIDOCAINE VISCOUS HCL 2 % MT SOLN: 20.0000 mL | OROMUCOSAL | 0 refills | Status: DC | PRN

## 2019-01-12 NOTE — Progress Notes (Signed)
Location:  [place] Room Number: 573-U Place of Service:  SNF (31) Provider:  Durenda Age, NP  Patient Care Team: Mosie Lukes, MD as PCP - General (Family Medicine)  Extended Emergency Contact Information Primary Emergency Contact: [place] Mobile Phone: [phone] Relation: Daughter  Code Status:  Full Code  Goals of care: Advanced Directive information Advanced Directives 09/09/2017  Does Patient Have a Medical Advance Directive? No     Chief Complaint  Patient presents with  . Discharge Note    Patient is to discharge home on 01/13/19    HPI:  Pt is a 73 y.o. male seen today for a discharge visit.  He is to discharge home on 01/13/19 with home health PT, OT, and Nursing services.    He has been admitted to [facility] on 12/24/2018 following an admission at [facility] 1/5- 12/24/2022 CVA. He has a PMH of CVA, HTN, tobacco use, recurrent UTIs, GERD, history of bladder cancer, and medical nonadherence.  He was having progressive AMS and aphasia.  He was found to be hypertensive, having a taking BP medications on a regular basis.  MRI demonstrated acute to subacute watershed left MCA infarcts.  CT angiography revealed multivessel atherosclerotic disease with moderate to severe left ICA stenosis and severe M2 stenosis.  High-dose statin and dual antiplatelet therapy were initiated.  He was evaluated by neurology.  He was given Plavix 300 mg followed by [facility] with plans to continue aspirin indefinitely and Plavix for 3 months.  Patient was admitted to this facility for short-term rehabilitation after the patient's recent hospitalization.  Patient has completed SNF rehabilitation and therapy has cleared the patient for discharge.   Past Medical History:  Diagnosis Date  . Abdominal pain, other specified site 09/26/2013  . Anxiety   . Arthritis   . Atherosclerosis of arteries 05/13/2013  . Cough  09/26/2013  . Depression   . Eating disorder   . GERD (gastroesophageal reflux disease)   . Hepatitis C   . Hypertension   . Lesion of liver 04/01/2013  . Loss of weight 06/21/2014  . Medial knee pain 09/26/2013  . Palpitations 06/21/2014  . Shortness of breath dyspnea    with exertion   Past Surgical History:  Procedure Laterality Date  . IR ANGIO INTRA EXTRACRAN SEL COM CAROTID INNOMINATE BILAT MOD SED  09/09/2017  . IR ANGIO VERTEBRAL SEL VERTEBRAL BILAT MOD SED  09/09/2017  . IR RADIOLOGIST EVAL & MGMT  09/07/2017  . MICROLARYNGOSCOPY WITH CO2 LASER AND EXCISION OF VOCAL CORD LESION N/A 05/14/2016   Procedure: MICROLARYNGOSCOPY WITH CO2 LASER AND EXCISION OF VOCAL CORD LESION;  Surgeon: Melida Quitter, MD;  Location: [place];  Service: ENT;  Laterality: N/A;  Suspended micro laryngoscopy with CO2 laser excision of vocal fold lesion  . TUMOR REMOVAL  1990   bladder    Allergies  Allergen Reactions  . Iohexol Hives     Desc: pt broke out in hives years ago from iv contrast. he was given 50mg  benadryl po, 1 hr prior to ct and did fine today w/o complications.  JB     Outpatient Encounter Medications as of 01/12/2019  Medication Sig  . aspirin EC 81 MG tablet Take 81 mg by mouth daily.   Marland Kitchen atorvastatin (LIPITOR) 80 MG tablet Take 1 tablet (80 mg total) by mouth daily at 6 PM.  . clopidogrel (PLAVIX) 75 MG tablet Take 1 tablet (75 mg total) by mouth  daily.  . lidocaine (XYLOCAINE) 2 % solution Use as directed 20 mLs in the mouth or throat as needed for mouth pain.  . metoprolol tartrate (LOPRESSOR) 25 MG tablet Take 0.5 tablets (12.5 mg total) by mouth 2 (two) times daily.  . nicotine (NICODERM CQ - DOSED IN MG/24 HOURS) 21 mg/24hr patch Place 21 mg onto the skin daily.  Marland Kitchen NUTRITIONAL SUPPLEMENT LIQD Take 120 mLs by mouth daily. MedPass  . oseltamivir (TAMIFLU) 75 MG capsule Take 75 mg by mouth daily.  . pseudoephedrine-dextromethorphan-guaifenesin (ROBITUSSIN-PE) 30-10-100 MG/5ML solution  Take 10 mLs by mouth 3 (three) times daily.  . sertraline (ZOLOFT) 50 MG tablet Take 50 mg by mouth daily.  Marland Kitchen tetrahydrozoline-zinc (VISINE-AC) 0.05-0.25 % ophthalmic solution Place 1 drop into both eyes daily as needed (dry eyes).    No facility-administered encounter medications on file as of 01/12/2019.     Review of Systems  GENERAL: No change in appetite, no fatigue, no weight changes, no fever, chills or weakness EYES: Denies dry eyes, eye pain, itching or discharge, has right neglect EARS: Denies change in hearing, ringing in ears, or earache MOUTH and THROAT: Denies oral discomfort, gingival pain or bleeding RESPIRATORY: no cough, SOB, DOE, wheezing, hemoptysis CARDIAC: No chest pain, edema or palpitations GI: No abdominal pain, diarrhea, constipation, heart burn, nausea or vomiting NEUROLOGICAL: Denies dizziness, syncope, numbness, or headache PSYCHIATRIC: Denies feelings of depression or anxiety. No report of hallucinations, insomnia, paranoia, or agitation    Immunization History  Administered Date(s) Administered  . Influenza Split 09/14/2012  . Influenza, High Dose Seasonal PF 09/24/2013, 09/05/2015, 12/20/2017  . Influenza,inj,Quad PF,6+ Mos 10/23/2014  . Pneumococcal Conjugate-13 10/23/2014   Pertinent  Health Maintenance Due  Topic Date Due  . PNA vac Low Risk Adult (2 of 2 - PPSV23) 10/24/2015  . INFLUENZA VACCINE  07/13/2018  . COLONOSCOPY  11/17/2019   Fall Risk  12/20/2017 03/08/2016 10/23/2014 10/23/2014  Falls in the past year? No Yes No No  Number falls in past yr: - 1 - -  Injury with Fall? - No - -  Risk for fall due to : - - - History of fall(s)      Vitals:   01/12/19 1002  BP: 117/68  Pulse: 65  Resp: 20  Temp: (!) 97 F (36.1 C)  TempSrc: Oral  SpO2: 98%  Weight: 147 lb 6.4 oz (66.9 kg)  Height: 5\' 11"  (1.803 m)   Body mass index is 20.56 kg/m.  Physical Exam  GENERAL APPEARANCE:  In no acute distress.  SKIN:  Skin is warm and dry.    EYES: Lids open and close normally. No blepharitis, entropion or ectropion.  Conjunctivae are clear and sclerae are white. Lenses are without opacity. Has right neglect EARS: Pinnae are normal. Patient hears normal voice tunes of the examiner MOUTH and THROAT: Lips are without lesions. Oral mucosa is moist and without lesions.  RESPIRATORY: Breathing is even & unlabored, BS CTAB CARDIAC: RRR, no murmur,no extra heart sounds, no edema GI: Abdomen soft, normal BS, no masses, no tenderness GU:  Right urostomy attached to a bag draining to urine bag EXTREMITIES: Able to move X 4 extremities NEUROLOGICAL: There is no tremor. Speech is clear. Alert and oriented X 3. PSYCHIATRIC:  Affect and behavior are appropriate   Labs reviewed: Recent Labs    12/17/18 2032 12/18/18 0049 12/18/18 0322 12/24/18 0531  NA 141  --  139  --   K 3.9  --  3.7  --  CL 106  --  106  --   CO2 24  --  24  --   GLUCOSE 98  --  101*  --   BUN 18  --  15  --   CREATININE 0.98 0.98 0.81 1.36*  CALCIUM 9.8  --  9.3  --    Recent Labs    12/17/18 2032 12/18/18 0322  AST 46* 40  ALT 46* 43  ALKPHOS 75 77  BILITOT 0.9 1.2  PROT 7.8 7.4  ALBUMIN 3.6 3.4*   Recent Labs    12/17/18 2032 12/18/18 0049 12/18/18 0322  WBC 8.5 8.1 7.8  NEUTROABS 4.6  --   --   HGB 14.5 13.7 13.8  HCT 45.0 43.0 44.6  MCV 111.1* 109.4* 110.4*  PLT 267 239 244   Lab Results  Component Value Date   TSH 1.46 12/20/2017   Lab Results  Component Value Date   HGBA1C 4.2 (L) 12/18/2018   Lab Results  Component Value Date   CHOL 178 12/18/2018   HDL 41 12/18/2018   LDLCALC 113 (H) 12/18/2018   TRIG 119 12/18/2018   CHOLHDL 4.3 12/18/2018    Significant Diagnostic Results in last 30 days:  Dg Chest 1 View  Result Date: 12/18/2018 CLINICAL DATA:  Initial evaluation for MRI clearance. EXAM: CHEST  1 VIEW COMPARISON:  Prior radiograph from 06/21/2014. FINDINGS: Cardiomegaly. Mediastinal silhouette within normal limits.  Aortic atherosclerosis. Lungs mildly hypoinflated. Mild diffuse interstitial congestion without overt pulmonary edema. No pleural effusion. No consolidative opacity. No pneumothorax. No acute osseous abnormality. No radiopaque hardware or other finding to prevent MRI exam. IMPRESSION: 1. Patient clear for MRI.  No radiopaque hardware identified. 2. Cardiomegaly with mild diffuse pulmonary interstitial congestion without overt pulmonary edema. 3. Aortic atherosclerosis. Electronically Signed   By: Jeannine Boga M.D.   On: 12/18/2018 01:56   Dg Abdomen 1 View  Result Date: 12/18/2018 CLINICAL DATA:  Initial evaluation for MRI screening clearance. EXAM: ABDOMEN - 1 VIEW COMPARISON:  Prior CT from 04/06/2013 FINDINGS: Bowel gas pattern within normal limits without obstruction or ileus. No abnormal bowel wall thickening. No soft tissue mass or abnormal calcification. Multiple surgical clips seen overlying the pelvis, stable from prior CT. Anastomotic suture noted within the right abdomen. No other radiopaque density or findings to prevent MRI exam. Extensive degenerative changes noted within the visualized spine and about the hips bilaterally. IMPRESSION: 1. Patient cleared for MRI. Multiple surgical clips overlying the pelvis with anastomotic suture within the right abdomen felt to be safe for MRI, and are stable relative to prior CT from 2013. 2. Nonobstructive bowel gas pattern Electronically Signed   By: Jeannine Boga M.D.   On: 12/18/2018 01:54   Ct Head Wo Contrast  Result Date: 12/17/2018 CLINICAL DATA:  Nonverbal since awakening from a nap this afternoon, history hypertension, smoker EXAM: CT HEAD WITHOUT CONTRAST TECHNIQUE: Contiguous axial images were obtained from the base of the skull through the vertex without intravenous contrast. Sagittal and coronal MPR images reconstructed from axial data set. COMPARISON:  08/24/2017 FINDINGS: Brain: Generalized atrophy. Normal ventricular morphology.  No midline shift or mass effect. Further evolution of LEFT posterior parietal watershed infarct seen previously. Small vessel chronic ischemic changes of deep cerebral white matter. No intracranial hemorrhage, mass lesion, or evidence of acute infarction. No extra-axial fluid collections. Vascular: Atherosclerotic calcifications of internal carotid arteries at skull base Skull: Intact Sinuses/Orbits: Opacified RIGHT frontal sinus with air-fluid level. Remaining paranasal sinuses and mastoid air  cells clear. Other: N/A IMPRESSION: Atrophy with small vessel chronic ischemic changes of deep cerebral white matter. Further evolution of previously identified LEFT posterior parietal watershed infarct. No definite new intracranial abnormalities. Question RIGHT frontal sinusitis. Electronically Signed   By: Lavonia Dana M.D.   On: 12/17/2018 21:00   Mr Brain Wo Contrast  Result Date: 12/18/2018 CLINICAL DATA:  Initial evaluation for acute TIA. EXAM: MRI HEAD WITHOUT CONTRAST MRA HEAD WITHOUT CONTRAST TECHNIQUE: Multiplanar, multiecho pulse sequences of the brain and surrounding structures were obtained without intravenous contrast. Angiographic images of the head were obtained using MRA technique without contrast. COMPARISON:  Prior CT from 12/17/2017 as well as previous MRI from 08/08/2017 and arteriogram from 09/09/2017. FINDINGS: MRI HEAD FINDINGS Brain: Examination mildly degraded by motion artifact. Moderately advanced cerebral atrophy with chronic small vessel ischemic disease. Encephalomalacia with gliosis within the left parieto-occipital region compatible with remote left posterior MCA territory infarcts and/or left MCA/PCA watershed infarcts. Associated chronic hemosiderin staining and/or laminar necrosis within this region. Intrinsic diffusion abnormality related to susceptibility artifact noted about this chronic infarct. Patchy multifocal areas of acute to early subacute ischemic infarcts seen involving the  cortical gray matter and underlying hemispheric white matter of the anterior and mid left frontal lobe, left MCA territory (series 5, image 90, 80 for). Associated patchy involvement within the underlying caudate and left lentiform nuclei. Associated minimal faint petechial hemorrhage without hemorrhagic transformation (series 20, image 44). No other evidence for acute or subacute ischemia. Gray-white matter differentiation otherwise maintained. No other evidence for acute or chronic intracranial hemorrhage. No mass lesion, midline shift or mass effect. Ventricular prominence related global parenchymal volume loss without hydrocephalus. No extra-axial fluid collection. Vascular: Major intracranial vascular flow voids maintained at the skull base. Skull and upper cervical spine: Prominent degenerative changes noted about the C1-2 articulation. Craniocervical junction otherwise unremarkable. Bone marrow signal intensity within normal limits. No scalp soft tissue abnormality. Sinuses/Orbits: Globes and orbital soft tissues within normal limits. Right fronto ethmoidal sinusitis noted. Small bilateral mastoid effusions, of doubtful significance. Inner ear structures grossly normal. Other: None. MRA HEAD FINDINGS ANTERIOR CIRCULATION: Visualized distal cervical segments of the internal carotid arteries are patent with symmetric antegrade flow. Petrous, cavernous, and supraclinoid right ICA patent to the terminus without stenosis. Petrous left ICA widely patent. Short-segment moderate approximately 50% stenosis at the left petrous-cavernous junction. Tandem moderate stenoses involving the cavernous and supraclinoid left ICA of up to approximately 50%. Left ICA terminus well perfused. Right A1 widely patent. Short-segment moderate stenosis within the mid left A1 segment (series 1028, image 9) anterior communicating artery not well assessed on this exam. Anterior cerebral arteries demonstrates mild atheromatous irregularity  but are patent to their distal aspects without flow-limiting stenosis. Short-segment moderate approximate 50-70% stenosis at the mid right M1 segment (series 1028, image 8). Right MCA otherwise patent to the bifurcation. Extensive small vessel atheromatous irregularity seen throughout the right MCA branches. Left M1 segment diffusely irregular, likely reflecting atherosclerotic disease. There is a superimposed short-segment moderate approximate 50% stenosis at the proximal left M1 segment (series 9, image 98). Left M1 otherwise patent to the bifurcation. Short-segment moderate to severe proximal left M2 stenoses involving both the superior and inferior divisions. Extensive atheromatous irregularity distally within the left MCA branches. POSTERIOR CIRCULATION: Right vertebral artery dominant, with a diffusely hypoplastic left vertebral artery. Atheromatous irregularity throughout the V4 segments compatible with atherosclerotic change. No significant stenosis on the right. There are severe tandem stenoses involving the distal left  V4 segment (series 1037, image 6). Posterior inferior cerebral arteries not seen. Diffuse atheromatous irregularity throughout the basilar artery without high-grade stenosis. Superior cerebral arteries patent bilaterally. Both of the posterior cerebral arteries primarily supplied via the basilar. Extensive atheromatous change throughout the PCAs bilaterally without high-grade stenosis. Overall, appearance of the intracranial circulation likely similar as compared to previous arteriogram. IMPRESSION: MRI HEAD IMPRESSION: 1. Patchy multifocal acute to early subacute left MCA territory infarcts involving the left frontal lobe and basal ganglia. Associated faint petechial hemorrhage without frank hemorrhagic transformation. No significant mass effect. 2. Chronic left parieto-occipital infarct 3. Underlying moderately advanced cerebral atrophy with chronic small vessel ischemic disease. 4. Right  fronto ethmoidal sinusitis. MRA HEAD IMPRESSION: 1. Negative intracranial MRA for large vessel occlusion. 2. Extensive atheromatous disease throughout the intracranial circulation, with notable findings including moderate tandem cavernous/supraclinoid left ICA stenoses, moderate bilateral M1 stenoses, moderate to severe proximal left M2 branch stenoses, and severe tandem left V4 stenoses. Overall, appearance of the intracranial circulation likely relatively similar as compared to previous arteriogram from 2018. Electronically Signed   By: Jeannine Boga M.D.   On: 12/18/2018 03:27   Mr Jodene Nam Head Wo Contrast  Result Date: 12/18/2018 CLINICAL DATA:  Initial evaluation for acute TIA. EXAM: MRI HEAD WITHOUT CONTRAST MRA HEAD WITHOUT CONTRAST TECHNIQUE: Multiplanar, multiecho pulse sequences of the brain and surrounding structures were obtained without intravenous contrast. Angiographic images of the head were obtained using MRA technique without contrast. COMPARISON:  Prior CT from 12/17/2017 as well as previous MRI from 08/08/2017 and arteriogram from 09/09/2017. FINDINGS: MRI HEAD FINDINGS Brain: Examination mildly degraded by motion artifact. Moderately advanced cerebral atrophy with chronic small vessel ischemic disease. Encephalomalacia with gliosis within the left parieto-occipital region compatible with remote left posterior MCA territory infarcts and/or left MCA/PCA watershed infarcts. Associated chronic hemosiderin staining and/or laminar necrosis within this region. Intrinsic diffusion abnormality related to susceptibility artifact noted about this chronic infarct. Patchy multifocal areas of acute to early subacute ischemic infarcts seen involving the cortical gray matter and underlying hemispheric white matter of the anterior and mid left frontal lobe, left MCA territory (series 5, image 90, 80 for). Associated patchy involvement within the underlying caudate and left lentiform nuclei. Associated  minimal faint petechial hemorrhage without hemorrhagic transformation (series 20, image 44). No other evidence for acute or subacute ischemia. Gray-white matter differentiation otherwise maintained. No other evidence for acute or chronic intracranial hemorrhage. No mass lesion, midline shift or mass effect. Ventricular prominence related global parenchymal volume loss without hydrocephalus. No extra-axial fluid collection. Vascular: Major intracranial vascular flow voids maintained at the skull base. Skull and upper cervical spine: Prominent degenerative changes noted about the C1-2 articulation. Craniocervical junction otherwise unremarkable. Bone marrow signal intensity within normal limits. No scalp soft tissue abnormality. Sinuses/Orbits: Globes and orbital soft tissues within normal limits. Right fronto ethmoidal sinusitis noted. Small bilateral mastoid effusions, of doubtful significance. Inner ear structures grossly normal. Other: None. MRA HEAD FINDINGS ANTERIOR CIRCULATION: Visualized distal cervical segments of the internal carotid arteries are patent with symmetric antegrade flow. Petrous, cavernous, and supraclinoid right ICA patent to the terminus without stenosis. Petrous left ICA widely patent. Short-segment moderate approximately 50% stenosis at the left petrous-cavernous junction. Tandem moderate stenoses involving the cavernous and supraclinoid left ICA of up to approximately 50%. Left ICA terminus well perfused. Right A1 widely patent. Short-segment moderate stenosis within the mid left A1 segment (series 1028, image 9) anterior communicating artery not well assessed on this exam. Anterior  cerebral arteries demonstrates mild atheromatous irregularity but are patent to their distal aspects without flow-limiting stenosis. Short-segment moderate approximate 50-70% stenosis at the mid right M1 segment (series 1028, image 8). Right MCA otherwise patent to the bifurcation. Extensive small vessel  atheromatous irregularity seen throughout the right MCA branches. Left M1 segment diffusely irregular, likely reflecting atherosclerotic disease. There is a superimposed short-segment moderate approximate 50% stenosis at the proximal left M1 segment (series 9, image 98). Left M1 otherwise patent to the bifurcation. Short-segment moderate to severe proximal left M2 stenoses involving both the superior and inferior divisions. Extensive atheromatous irregularity distally within the left MCA branches. POSTERIOR CIRCULATION: Right vertebral artery dominant, with a diffusely hypoplastic left vertebral artery. Atheromatous irregularity throughout the V4 segments compatible with atherosclerotic change. No significant stenosis on the right. There are severe tandem stenoses involving the distal left V4 segment (series 1037, image 6). Posterior inferior cerebral arteries not seen. Diffuse atheromatous irregularity throughout the basilar artery without high-grade stenosis. Superior cerebral arteries patent bilaterally. Both of the posterior cerebral arteries primarily supplied via the basilar. Extensive atheromatous change throughout the PCAs bilaterally without high-grade stenosis. Overall, appearance of the intracranial circulation likely similar as compared to previous arteriogram. IMPRESSION: MRI HEAD IMPRESSION: 1. Patchy multifocal acute to early subacute left MCA territory infarcts involving the left frontal lobe and basal ganglia. Associated faint petechial hemorrhage without frank hemorrhagic transformation. No significant mass effect. 2. Chronic left parieto-occipital infarct 3. Underlying moderately advanced cerebral atrophy with chronic small vessel ischemic disease. 4. Right fronto ethmoidal sinusitis. MRA HEAD IMPRESSION: 1. Negative intracranial MRA for large vessel occlusion. 2. Extensive atheromatous disease throughout the intracranial circulation, with notable findings including moderate tandem  cavernous/supraclinoid left ICA stenoses, moderate bilateral M1 stenoses, moderate to severe proximal left M2 branch stenoses, and severe tandem left V4 stenoses. Overall, appearance of the intracranial circulation likely relatively similar as compared to previous arteriogram from 2018. Electronically Signed   By: Jeannine Boga M.D.   On: 12/18/2018 03:27   Vas US Carotid (at [place] Only)  Result Date: 12/19/2018 Carotid Arterial Duplex Study Indications:   CVA and Speech disturbance. Risk Factors:  Hypertension, prior CVA. Other Factors: Non compliant with medications.  Performing Technologist: Toma Copier RVS  Examination Guidelines: A complete evaluation includes B-mode imaging, spectral Doppler, color Doppler, and power Doppler as needed of all accessible portions of each vessel. Bilateral testing is considered an integral part of a complete examination. Limited examinations for reoccurring indications may be performed as noted.  Right Carotid Findings: +----------+--------+--------+--------+------------+--------------------+           PSV cm/sEDV cm/sStenosisDescribe    Comments             +----------+--------+--------+--------+------------+--------------------+ CCA Prox  75      4                           mild intimal changes +----------+--------+--------+--------+------------+--------------------+ CCA Distal63      9                           mild intimal changes +----------+--------+--------+--------+------------+--------------------+ ICA Prox  32      7               heterogenousmild plaque          +----------+--------+--------+--------+------------+--------------------+ ICA Mid   67      14  tortuous             +----------+--------+--------+--------+------------+--------------------+ ICA Distal76      16                          tortuous              +----------+--------+--------+--------+------------+--------------------+ ECA       112     12              heterogenousmild plaque          +----------+--------+--------+--------+------------+--------------------+ +----------+--------+-------+--------+-------------------+           PSV cm/sEDV cmsDescribeArm Pressure (mmHG) +----------+--------+-------+--------+-------------------+ [id number]                                         +----------+--------+-------+--------+-------------------+ +---------+--------+--+--------+-+ VertebralPSV cm/s46EDV cm/s8 +---------+--------+--+--------+-+ 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  Left Carotid Findings: +----------+--------+--------+--------+------------+---------------------------+           PSV cm/sEDV cm/sStenosisDescribe    Comments                    +----------+--------+--------+--------+------------+---------------------------+ CCA Prox  84      3                           mild intimal changes        +----------+--------+--------+--------+------------+---------------------------+ CCA Distal61      9                           mild intimal changes        +----------+--------+--------+--------+------------+---------------------------+ ICA Prox  50      10              heterogenousmild plaque                 +----------+--------+--------+--------+------------+---------------------------+ ICA Mid   86      19              heterogenousNot visualized              +----------+--------+--------+--------+------------+---------------------------+ ICA Distal                                    unable to visualize the                                                   most distal ICA due to high                                               bifurcation]                +----------+--------+--------+--------+------------+---------------------------+ ECA       36       5               heterogenousmild plaque                 +----------+--------+--------+--------+------------+---------------------------+ +----------+--------+--------+--------+-------------------+  SubclavianPSV cm/sEDV cm/sDescribeArm Pressure (mmHG) +----------+--------+--------+--------+-------------------+           151                                         +----------+--------+--------+--------+-------------------+ +---------+--------+--+--------+-+ VertebralPSV cm/s34EDV cm/s6 +---------+--------+--+--------+-+ Technically difficult due to 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  Summary: Right Carotid: Velocities in the right ICA are consistent with a 1-39% stenosis.                See technician notes listed above. Left Carotid: Velocities in the left ICA are consistent with a 1-39% stenosis.               See technician notes listed above. Vertebrals:  Bilateral vertebral arteries demonstrate antegrade flow. Subclavians: Normal flow hemodynamics were seen in bilateral subclavian              arteries. *See table(s) above for measurements and observations.  Electronically signed by Antony Contras MD on 12/19/2018 at 2:05:41 PM.    Final     Assessment/Plan  1. Cerebrovascular accident (CVA) due to thrombosis of left middle cerebral artery ([place]) --Follow-up with neurology - atorvastatin (LIPITOR) 80 MG tablet; Take 1 tablet (80 mg total) by mouth daily at 6 PM.  Dispense: 30 tablet; Refill: 0 - clopidogrel (PLAVIX) 75 MG tablet; Take 1 tablet (75 mg total) by mouth daily.  Dispense: 30 tablet; Refill: 0 - metoprolol tartrate (LOPRESSOR) 25 MG tablet; Take 0.5 tablets (12.5 mg total) by mouth 2 (two) times daily.  Dispense: 30 tablet; Refill: 0  2. Essential hypertension - metoprolol tartrate (LOPRESSOR) 25 MG tablet; Take 0.5 tablets (12.5 mg total) by mouth 2 (two) times daily.  Dispense: 30 tablet; Refill: 0  3. SVT (supraventricular tachycardia) (HCC) - metoprolol  tartrate (LOPRESSOR) 25 MG tablet; Take 0.5 tablets (12.5 mg total) by mouth 2 (two) times daily.  Dispense: 30 tablet; Refill: 0  4. Depression, recurrent ([place]) - sertraline (ZOLOFT) 50 MG tablet; Take 1 tablet (50 mg total) by mouth daily.  Dispense: 30 tablet; Refill: 0  5. Tobacco use - nicotine (NICODERM CQ - DOSED IN MG/24 HOURS) 21 mg/24hr patch; Place 1 patch (21 mg total) onto the skin daily for 28 days.  Dispense: 28 patch; Refill: 0  6. Exposure to the flu - oseltamivir (TAMIFLU) 75 MG capsule; Take 1 capsule (75 mg total) by mouth daily for 5 days.  Dispense: 5 capsule; Refill: 0    I have filled out patient's discharge paperwork and written prescriptions.  Patient will receive home health PT, OT and Nursing..  DME provided:  Shower bench, 3-in-1 bedside commode,urostomy supplies and a wheelchair.   Total discharge time: Greater than 30 minutes Greater than 50% was spent in counseling and coordination of care.   Discharge time involved coordination of the discharge process with social worker, nursing staff and therapy department. Medical justification for home health services/DME verified.   Durenda Age, NP [facility] [phone] (Monday-Friday 8:00 a.m. - 5:00 p.m.) [phone] (after hours)

## 2019-01-15 ENCOUNTER — Inpatient Hospital Stay: Payer: Medicare PPO | Admitting: Family Medicine

## 2019-01-18 ENCOUNTER — Telehealth: Payer: Self-pay | Admitting: Family Medicine

## 2019-01-18 NOTE — Telephone Encounter (Signed)
Copied from [place] [id number]. Topic: Quick Communication - See Telephone Encounter >> Jan 18, 2019 10:49 AM Bea Graff, NT wrote: CRM for notification. See Telephone encounter for: 01/18/19. Keica with [facility] calling to let Dr. Charlett Blake know they will start seeing this pt on 01/19/2019 instead of 01/18/2019. CB#: [phone]

## 2019-01-19 ENCOUNTER — Telehealth: Payer: Self-pay | Admitting: Family Medicine

## 2019-01-19 DIAGNOSIS — Z8551 Personal history of malignant neoplasm of bladder: Secondary | ICD-10-CM | POA: Diagnosis not present

## 2019-01-19 DIAGNOSIS — I69320 Aphasia following cerebral infarction: Secondary | ICD-10-CM | POA: Diagnosis not present

## 2019-01-19 DIAGNOSIS — B1920 Unspecified viral hepatitis C without hepatic coma: Secondary | ICD-10-CM | POA: Diagnosis not present

## 2019-01-19 DIAGNOSIS — Z87440 Personal history of urinary (tract) infections: Secondary | ICD-10-CM | POA: Diagnosis not present

## 2019-01-19 DIAGNOSIS — I119 Hypertensive heart disease without heart failure: Secondary | ICD-10-CM | POA: Diagnosis not present

## 2019-01-19 DIAGNOSIS — G3184 Mild cognitive impairment, so stated: Secondary | ICD-10-CM | POA: Diagnosis not present

## 2019-01-19 DIAGNOSIS — Z87891 Personal history of nicotine dependence: Secondary | ICD-10-CM | POA: Diagnosis not present

## 2019-01-19 DIAGNOSIS — M1990 Unspecified osteoarthritis, unspecified site: Secondary | ICD-10-CM | POA: Diagnosis not present

## 2019-01-19 NOTE — Telephone Encounter (Signed)
Copied from [place]. Topic: General - Other >> Jan 19, 2019 12:59 PM Yvette Rack wrote: Reason for CRM: Nurse Willis Modena would like someone to call either [facility] or [facility] @ [phone] stating that pt has been discharged from a facility on the 1st and would like to f/u with meds and diease management for  1 x 1    2 x 3   and 2 PRN

## 2019-01-22 DIAGNOSIS — G3184 Mild cognitive impairment, so stated: Secondary | ICD-10-CM | POA: Diagnosis not present

## 2019-01-22 DIAGNOSIS — Z87440 Personal history of urinary (tract) infections: Secondary | ICD-10-CM | POA: Diagnosis not present

## 2019-01-22 DIAGNOSIS — Z87891 Personal history of nicotine dependence: Secondary | ICD-10-CM | POA: Diagnosis not present

## 2019-01-22 DIAGNOSIS — I69320 Aphasia following cerebral infarction: Secondary | ICD-10-CM | POA: Diagnosis not present

## 2019-01-22 DIAGNOSIS — B1920 Unspecified viral hepatitis C without hepatic coma: Secondary | ICD-10-CM | POA: Diagnosis not present

## 2019-01-22 DIAGNOSIS — M1990 Unspecified osteoarthritis, unspecified site: Secondary | ICD-10-CM | POA: Diagnosis not present

## 2019-01-22 DIAGNOSIS — Z8551 Personal history of malignant neoplasm of bladder: Secondary | ICD-10-CM | POA: Diagnosis not present

## 2019-01-22 DIAGNOSIS — I119 Hypertensive heart disease without heart failure: Secondary | ICD-10-CM | POA: Diagnosis not present

## 2019-01-22 NOTE — Telephone Encounter (Signed)
Please advise 

## 2019-01-22 NOTE — Telephone Encounter (Signed)
Yes please give them order to follow up as requested and bring him in her for follow up in the next couple of weeks.

## 2019-01-23 ENCOUNTER — Telehealth: Payer: Self-pay | Admitting: Family Medicine

## 2019-01-23 NOTE — Telephone Encounter (Unsigned)
Copied from [place] [id number]. Topic: Quick Communication - Home Health Verbal Orders >> Jan 23, 2019  9:31 AM Yvette Rack wrote: Caller/Agency: Joey with [facility] Number: [phone] Requesting OT/PT/Skilled Nursing/Social Work: PT  Frequency: 2 times a week for 4 weeks

## 2019-01-25 DIAGNOSIS — Z87891 Personal history of nicotine dependence: Secondary | ICD-10-CM | POA: Diagnosis not present

## 2019-01-25 DIAGNOSIS — B1920 Unspecified viral hepatitis C without hepatic coma: Secondary | ICD-10-CM | POA: Diagnosis not present

## 2019-01-25 DIAGNOSIS — Z87440 Personal history of urinary (tract) infections: Secondary | ICD-10-CM | POA: Diagnosis not present

## 2019-01-25 DIAGNOSIS — G3184 Mild cognitive impairment, so stated: Secondary | ICD-10-CM | POA: Diagnosis not present

## 2019-01-25 DIAGNOSIS — I119 Hypertensive heart disease without heart failure: Secondary | ICD-10-CM | POA: Diagnosis not present

## 2019-01-25 DIAGNOSIS — Z8551 Personal history of malignant neoplasm of bladder: Secondary | ICD-10-CM | POA: Diagnosis not present

## 2019-01-25 DIAGNOSIS — I69320 Aphasia following cerebral infarction: Secondary | ICD-10-CM | POA: Diagnosis not present

## 2019-01-25 DIAGNOSIS — M1990 Unspecified osteoarthritis, unspecified site: Secondary | ICD-10-CM | POA: Diagnosis not present

## 2019-01-25 NOTE — Telephone Encounter (Signed)
Verbal orders given  

## 2019-01-26 DIAGNOSIS — I119 Hypertensive heart disease without heart failure: Secondary | ICD-10-CM | POA: Diagnosis not present

## 2019-01-26 DIAGNOSIS — Z87891 Personal history of nicotine dependence: Secondary | ICD-10-CM | POA: Diagnosis not present

## 2019-01-26 DIAGNOSIS — Z87440 Personal history of urinary (tract) infections: Secondary | ICD-10-CM | POA: Diagnosis not present

## 2019-01-26 DIAGNOSIS — Z8551 Personal history of malignant neoplasm of bladder: Secondary | ICD-10-CM | POA: Diagnosis not present

## 2019-01-26 DIAGNOSIS — G3184 Mild cognitive impairment, so stated: Secondary | ICD-10-CM | POA: Diagnosis not present

## 2019-01-26 DIAGNOSIS — B1920 Unspecified viral hepatitis C without hepatic coma: Secondary | ICD-10-CM | POA: Diagnosis not present

## 2019-01-26 DIAGNOSIS — I69320 Aphasia following cerebral infarction: Secondary | ICD-10-CM | POA: Diagnosis not present

## 2019-01-26 DIAGNOSIS — M1990 Unspecified osteoarthritis, unspecified site: Secondary | ICD-10-CM | POA: Diagnosis not present

## 2019-01-30 DIAGNOSIS — Z87891 Personal history of nicotine dependence: Secondary | ICD-10-CM | POA: Diagnosis not present

## 2019-01-30 DIAGNOSIS — G3184 Mild cognitive impairment, so stated: Secondary | ICD-10-CM | POA: Diagnosis not present

## 2019-01-30 DIAGNOSIS — Z87440 Personal history of urinary (tract) infections: Secondary | ICD-10-CM | POA: Diagnosis not present

## 2019-01-30 DIAGNOSIS — M1990 Unspecified osteoarthritis, unspecified site: Secondary | ICD-10-CM | POA: Diagnosis not present

## 2019-01-30 DIAGNOSIS — I119 Hypertensive heart disease without heart failure: Secondary | ICD-10-CM | POA: Diagnosis not present

## 2019-01-30 DIAGNOSIS — I69320 Aphasia following cerebral infarction: Secondary | ICD-10-CM | POA: Diagnosis not present

## 2019-01-30 DIAGNOSIS — Z8551 Personal history of malignant neoplasm of bladder: Secondary | ICD-10-CM | POA: Diagnosis not present

## 2019-01-30 DIAGNOSIS — B1920 Unspecified viral hepatitis C without hepatic coma: Secondary | ICD-10-CM | POA: Diagnosis not present

## 2019-01-31 ENCOUNTER — Inpatient Hospital Stay: Payer: Medicare PPO | Admitting: Adult Health

## 2019-01-31 ENCOUNTER — Telehealth: Payer: Self-pay

## 2019-01-31 NOTE — Telephone Encounter (Signed)
Patient was a no call/no show for their appointment today.   

## 2019-01-31 NOTE — Progress Notes (Deleted)
[facility] [address] [place]. [place] [address] [phone]       OFFICE FOLLOW UP NOTE  Mr. Ronald Wolf Date of Birth:  21-Nov-1946 Medical Record Number:  [id number]   Reason for Referral:  hospital stroke follow up  CHIEF COMPLAINT:  No chief complaint on file.   HPI: Ronald Wolf is being seen today for initial visit in the office for *** on ***. History obtained from *** and chart review. Reviewed all radiology images and labs personally.   ROS:   14 system review of systems performed and negative with exception of ***  PMH:  Past Medical History:  Diagnosis Date  . Abdominal pain, other specified site 09/26/2013  . Anxiety   . Arthritis   . Atherosclerosis of arteries 05/13/2013  . Cough 09/26/2013  . Depression   . Eating disorder   . GERD (gastroesophageal reflux disease)   . Hepatitis C   . Hypertension   . Lesion of liver 04/01/2013  . Loss of weight 06/21/2014  . Medial knee pain 09/26/2013  . Palpitations 06/21/2014  . Shortness of breath dyspnea    with exertion    PSH:  Past Surgical History:  Procedure Laterality Date  . IR ANGIO INTRA EXTRACRAN SEL COM CAROTID INNOMINATE BILAT MOD SED  09/09/2017  . IR ANGIO VERTEBRAL SEL VERTEBRAL BILAT MOD SED  09/09/2017  . IR RADIOLOGIST EVAL & MGMT  09/07/2017  . MICROLARYNGOSCOPY WITH CO2 LASER AND EXCISION OF VOCAL CORD LESION N/A 05/14/2016   Procedure: MICROLARYNGOSCOPY WITH CO2 LASER AND EXCISION OF VOCAL CORD LESION;  Surgeon: Melida Quitter, MD;  Location: [facility];  Service: ENT;  Laterality: N/A;  Suspended micro laryngoscopy with CO2 laser excision of vocal fold lesion  . TUMOR REMOVAL  1990   bladder    Social History:  Social History   Socioeconomic History  . Marital status: Single    Spouse name: Not on file  . Number of children: 6  . Years of education: 39  . Highest education level: Not on file  Occupational History  . Not on file  Social Needs  . Financial resource  strain: Not on file  . Food insecurity:    Worry: Not on file    Inability: Not on file  . Transportation needs:    Medical: Not on file    Non-medical: Not on file  Tobacco Use  . Smoking status: Current Every Day Smoker    Packs/day: 1.00    Years: 35.00    Pack years: 35.00    Types: Cigarettes  . Smokeless tobacco: Never Used  Substance and Sexual Activity  . Alcohol use: Yes    Alcohol/week: 0.0 standard drinks    Comment: 11 pint daily    none in a month and a half  . Drug use: No    Types: Marijuana  . Sexual activity: Not on file  Lifestyle  . Physical activity:    Days per week: Not on file    Minutes per session: Not on file  . Stress: Not on file  Relationships  . Social connections:    Talks on phone: Not on file    Gets together: Not on file    Attends religious service: Not on file    Active member of club or organization: Not on file    Attends meetings of clubs or organizations: Not on file    Relationship status: Not on file  . Intimate partner violence:  Fear of current or ex partner: Not on file    Emotionally abused: Not on file    Physically abused: Not on file    Forced sexual activity: Not on file  Other Topics Concern  . Not on file  Social History Narrative   Patient drinks caffeine occasionally.   Patient is right handed   Lives alone   Wears seat belts    Family History:  Family History  Problem Relation Age of Onset  . Diabetes Mother   . Arthritis Mother   . Healthy Sister        x 2  . Cancer Brother        stomach  . Dementia Father   . Prostate cancer Neg Hx   . Colon cancer Neg Hx   . Breast cancer Neg Hx   . Heart disease Neg Hx   . Hypertension Neg Hx     Medications:   Current Outpatient Medications on File Prior to Visit  Medication Sig Dispense Refill  . aspirin EC 81 MG tablet Take 81 mg by mouth daily.     Marland Kitchen atorvastatin (LIPITOR) 80 MG tablet Take 1 tablet (80 mg total) by mouth daily at 6 PM. 30 tablet 0    . clopidogrel (PLAVIX) 75 MG tablet Take 1 tablet (75 mg total) by mouth daily. 30 tablet 0  . lidocaine (XYLOCAINE) 2 % solution Use as directed 20 mLs in the mouth or throat as needed for mouth pain. 100 mL 0  . metoprolol tartrate (LOPRESSOR) 25 MG tablet Take 0.5 tablets (12.5 mg total) by mouth 2 (two) times daily. 30 tablet 0  . nicotine (NICODERM CQ - DOSED IN MG/24 HOURS) 21 mg/24hr patch Place 1 patch (21 mg total) onto the skin daily for 28 days. 28 patch 0  . NUTRITIONAL SUPPLEMENT LIQD Take 120 mLs by mouth daily. MedPass    . pseudoephedrine-dextromethorphan-guaifenesin (ROBITUSSIN-PE) 30-10-100 MG/5ML solution Take 10 mLs by mouth 3 (three) times daily.    . sertraline (ZOLOFT) 50 MG tablet Take 1 tablet (50 mg total) by mouth daily. 30 tablet 0  . tetrahydrozoline-zinc (VISINE-AC) 0.05-0.25 % ophthalmic solution Place 1 drop into both eyes daily as needed (dry eyes).      No current facility-administered medications on file prior to visit.     Allergies:   Allergies  Allergen Reactions  . Iohexol Hives     Desc: pt broke out in hives years ago from iv contrast. he was given 50mg  benadryl po, 1 hr prior to ct and did fine today w/o complications.  JB      Physical Exam  There were no vitals filed for this visit. There is no height or weight on file to calculate BMI. No exam data present  Depression screen [facility] 2/9 12/20/2017  Decreased Interest 0  Down, Depressed, Hopeless 0  PHQ - 2 Score 0  Altered sleeping -  Tired, decreased energy -  Change in appetite -  Feeling bad or failure about yourself  -  Trouble concentrating -  Moving slowly or fidgety/restless -  Suicidal thoughts -  PHQ-9 Score -     General: well developed, well nourished, seated, in no evident distress Head: head normocephalic and atraumatic.   Neck: supple with no carotid or supraclavicular bruits Cardiovascular: regular rate and rhythm, no murmurs Musculoskeletal: no deformity Skin:  no  rash/petichiae Vascular:  Normal pulses all extremities  Neurologic Exam Mental Status: Awake and fully alert. Oriented to place and  time. Recent and remote memory intact. Attention span, concentration and fund of knowledge appropriate. Mood and affect appropriate.  Cranial Nerves: Fundoscopic exam reveals sharp disc margins. Pupils equal, briskly reactive to light. Extraocular movements full without nystagmus. Visual fields full to confrontation. Hearing intact. Facial sensation intact. Face, tongue, palate moves normally and symmetrically.  Motor: Normal bulk and tone. Normal strength in all tested extremity muscles. Sensory.: intact to touch , pinprick , position and vibratory sensation.  Coordination: Rapid alternating movements normal in all extremities. Finger-to-nose and heel-to-shin performed accurately bilaterally. Gait and Station: Arises from chair without difficulty. Stance is normal. Gait demonstrates normal stride length and balance. Able to heel, toe and tandem walk without difficulty.  Reflexes: 1+ and symmetric. Toes downgoing.    NIHSS  *** Modified [place]in  *** CHA2DS2-VASc *** HAS-BLED ***   Diagnostic Data (Labs, Imaging, Testing)  CT HEAD WO CONTRAST ***  CT ANGIO HEAD W OR WO CONTRAST CT ANGIO NECK W OR WO CONTRAST ***  MR BRAIN WO CONTRAST ***  MR MRA HEAD  MR MRA NECK ***  ECHOCARDIOGRAM ***    ASSESSMENT: ARISTIDE WAGGLE is a 73 y.o. year old male here with *** on *** secondary to ***. Vascular risk factors include ***.     PLAN:  1. *** : Continue {anticoagulants:31417}  and ***  for secondary stroke prevention. Maintain strict control of hypertension with blood pressure goal below 130/90, diabetes with hemoglobin A1c goal below 6.5% and cholesterol with LDL cholesterol (bad cholesterol) goal below 70 mg/dL.  I also advised the patient to eat a healthy diet with plenty of whole grains, cereals, fruits and vegetables, exercise regularly with at  least 30 minutes of continuous activity daily and maintain ideal body weight. 2. HTN: Advised to continue current treatment regimen.  Today's BP ***.  Advised to continue to monitor at home along with continued follow-up with PCP for management 3. HLD: Advised to continue current treatment regimen along with continued follow-up with PCP for future prescribing and monitoring of lipid panel 4. DMII: Advised to continue to monitor glucose levels at home along with continued follow-up with PCP for management and monitoring    Follow up in *** or call earlier if needed   Greater than 50% of time during this 25 minute visit was spent on counseling, explanation of diagnosis of ***, reviewing risk factor management of ***, planning of further management along with potential future management, and discussion with patient and family answering all questions.    Venancio Poisson, AGNP-BC  [facility] [address] [place], [place] [address]  Phone [phone] Fax [phone] Note: This document was prepared with digital dictation and possible smart phrase technology. Any transcriptional errors that result from this process are unintentional.

## 2019-02-01 DIAGNOSIS — G3184 Mild cognitive impairment, so stated: Secondary | ICD-10-CM | POA: Diagnosis not present

## 2019-02-01 DIAGNOSIS — Z87891 Personal history of nicotine dependence: Secondary | ICD-10-CM | POA: Diagnosis not present

## 2019-02-01 DIAGNOSIS — I119 Hypertensive heart disease without heart failure: Secondary | ICD-10-CM | POA: Diagnosis not present

## 2019-02-01 DIAGNOSIS — M1990 Unspecified osteoarthritis, unspecified site: Secondary | ICD-10-CM | POA: Diagnosis not present

## 2019-02-01 DIAGNOSIS — B1920 Unspecified viral hepatitis C without hepatic coma: Secondary | ICD-10-CM | POA: Diagnosis not present

## 2019-02-01 DIAGNOSIS — Z8551 Personal history of malignant neoplasm of bladder: Secondary | ICD-10-CM | POA: Diagnosis not present

## 2019-02-01 DIAGNOSIS — Z87440 Personal history of urinary (tract) infections: Secondary | ICD-10-CM | POA: Diagnosis not present

## 2019-02-01 DIAGNOSIS — I69320 Aphasia following cerebral infarction: Secondary | ICD-10-CM | POA: Diagnosis not present

## 2019-02-05 ENCOUNTER — Encounter: Payer: Self-pay | Admitting: Adult Health

## 2019-02-06 DIAGNOSIS — B1920 Unspecified viral hepatitis C without hepatic coma: Secondary | ICD-10-CM | POA: Diagnosis not present

## 2019-02-06 DIAGNOSIS — Z8551 Personal history of malignant neoplasm of bladder: Secondary | ICD-10-CM | POA: Diagnosis not present

## 2019-02-06 DIAGNOSIS — I119 Hypertensive heart disease without heart failure: Secondary | ICD-10-CM | POA: Diagnosis not present

## 2019-02-06 DIAGNOSIS — M1990 Unspecified osteoarthritis, unspecified site: Secondary | ICD-10-CM | POA: Diagnosis not present

## 2019-02-06 DIAGNOSIS — G3184 Mild cognitive impairment, so stated: Secondary | ICD-10-CM | POA: Diagnosis not present

## 2019-02-06 DIAGNOSIS — I69320 Aphasia following cerebral infarction: Secondary | ICD-10-CM | POA: Diagnosis not present

## 2019-02-06 DIAGNOSIS — Z87891 Personal history of nicotine dependence: Secondary | ICD-10-CM | POA: Diagnosis not present

## 2019-02-06 DIAGNOSIS — Z87440 Personal history of urinary (tract) infections: Secondary | ICD-10-CM | POA: Diagnosis not present

## 2019-02-08 DIAGNOSIS — Z87440 Personal history of urinary (tract) infections: Secondary | ICD-10-CM | POA: Diagnosis not present

## 2019-02-08 DIAGNOSIS — G3184 Mild cognitive impairment, so stated: Secondary | ICD-10-CM | POA: Diagnosis not present

## 2019-02-08 DIAGNOSIS — I69320 Aphasia following cerebral infarction: Secondary | ICD-10-CM | POA: Diagnosis not present

## 2019-02-08 DIAGNOSIS — Z8551 Personal history of malignant neoplasm of bladder: Secondary | ICD-10-CM | POA: Diagnosis not present

## 2019-02-08 DIAGNOSIS — B1920 Unspecified viral hepatitis C without hepatic coma: Secondary | ICD-10-CM | POA: Diagnosis not present

## 2019-02-08 DIAGNOSIS — Z87891 Personal history of nicotine dependence: Secondary | ICD-10-CM | POA: Diagnosis not present

## 2019-02-08 DIAGNOSIS — I119 Hypertensive heart disease without heart failure: Secondary | ICD-10-CM | POA: Diagnosis not present

## 2019-02-08 DIAGNOSIS — M1990 Unspecified osteoarthritis, unspecified site: Secondary | ICD-10-CM | POA: Diagnosis not present

## 2019-02-09 ENCOUNTER — Other Ambulatory Visit: Payer: Self-pay | Admitting: Adult Health

## 2019-02-09 DIAGNOSIS — B1920 Unspecified viral hepatitis C without hepatic coma: Secondary | ICD-10-CM | POA: Diagnosis not present

## 2019-02-09 DIAGNOSIS — I63312 Cerebral infarction due to thrombosis of left middle cerebral artery: Secondary | ICD-10-CM

## 2019-02-09 DIAGNOSIS — I10 Essential (primary) hypertension: Secondary | ICD-10-CM

## 2019-02-09 DIAGNOSIS — Z87440 Personal history of urinary (tract) infections: Secondary | ICD-10-CM | POA: Diagnosis not present

## 2019-02-09 DIAGNOSIS — I119 Hypertensive heart disease without heart failure: Secondary | ICD-10-CM | POA: Diagnosis not present

## 2019-02-09 DIAGNOSIS — M1990 Unspecified osteoarthritis, unspecified site: Secondary | ICD-10-CM | POA: Diagnosis not present

## 2019-02-09 DIAGNOSIS — F339 Major depressive disorder, recurrent, unspecified: Secondary | ICD-10-CM

## 2019-02-09 DIAGNOSIS — Z87891 Personal history of nicotine dependence: Secondary | ICD-10-CM | POA: Diagnosis not present

## 2019-02-09 DIAGNOSIS — G3184 Mild cognitive impairment, so stated: Secondary | ICD-10-CM | POA: Diagnosis not present

## 2019-02-09 DIAGNOSIS — I69320 Aphasia following cerebral infarction: Secondary | ICD-10-CM | POA: Diagnosis not present

## 2019-02-09 DIAGNOSIS — I471 Supraventricular tachycardia: Secondary | ICD-10-CM

## 2019-02-09 DIAGNOSIS — Z8551 Personal history of malignant neoplasm of bladder: Secondary | ICD-10-CM | POA: Diagnosis not present

## 2019-02-12 ENCOUNTER — Other Ambulatory Visit: Payer: Self-pay | Admitting: Adult Health

## 2019-02-12 DIAGNOSIS — I10 Essential (primary) hypertension: Secondary | ICD-10-CM

## 2019-02-12 DIAGNOSIS — I471 Supraventricular tachycardia: Secondary | ICD-10-CM

## 2019-02-12 DIAGNOSIS — I63312 Cerebral infarction due to thrombosis of left middle cerebral artery: Secondary | ICD-10-CM

## 2019-02-13 DIAGNOSIS — Z8551 Personal history of malignant neoplasm of bladder: Secondary | ICD-10-CM | POA: Diagnosis not present

## 2019-02-13 DIAGNOSIS — M1990 Unspecified osteoarthritis, unspecified site: Secondary | ICD-10-CM | POA: Diagnosis not present

## 2019-02-13 DIAGNOSIS — I69320 Aphasia following cerebral infarction: Secondary | ICD-10-CM | POA: Diagnosis not present

## 2019-02-13 DIAGNOSIS — Z87440 Personal history of urinary (tract) infections: Secondary | ICD-10-CM | POA: Diagnosis not present

## 2019-02-13 DIAGNOSIS — Z87891 Personal history of nicotine dependence: Secondary | ICD-10-CM | POA: Diagnosis not present

## 2019-02-13 DIAGNOSIS — G3184 Mild cognitive impairment, so stated: Secondary | ICD-10-CM | POA: Diagnosis not present

## 2019-02-13 DIAGNOSIS — I119 Hypertensive heart disease without heart failure: Secondary | ICD-10-CM | POA: Diagnosis not present

## 2019-02-13 DIAGNOSIS — B1920 Unspecified viral hepatitis C without hepatic coma: Secondary | ICD-10-CM | POA: Diagnosis not present

## 2019-02-16 DIAGNOSIS — B1920 Unspecified viral hepatitis C without hepatic coma: Secondary | ICD-10-CM | POA: Diagnosis not present

## 2019-02-16 DIAGNOSIS — I69320 Aphasia following cerebral infarction: Secondary | ICD-10-CM | POA: Diagnosis not present

## 2019-02-16 DIAGNOSIS — I119 Hypertensive heart disease without heart failure: Secondary | ICD-10-CM | POA: Diagnosis not present

## 2019-02-16 DIAGNOSIS — Z8551 Personal history of malignant neoplasm of bladder: Secondary | ICD-10-CM | POA: Diagnosis not present

## 2019-02-16 DIAGNOSIS — Z87891 Personal history of nicotine dependence: Secondary | ICD-10-CM | POA: Diagnosis not present

## 2019-02-16 DIAGNOSIS — G3184 Mild cognitive impairment, so stated: Secondary | ICD-10-CM | POA: Diagnosis not present

## 2019-02-16 DIAGNOSIS — Z87440 Personal history of urinary (tract) infections: Secondary | ICD-10-CM | POA: Diagnosis not present

## 2019-02-16 DIAGNOSIS — M1990 Unspecified osteoarthritis, unspecified site: Secondary | ICD-10-CM | POA: Diagnosis not present

## 2019-02-26 ENCOUNTER — Other Ambulatory Visit: Payer: Self-pay | Admitting: Adult Health

## 2019-02-26 ENCOUNTER — Other Ambulatory Visit: Payer: Self-pay | Admitting: Family Medicine

## 2019-02-26 DIAGNOSIS — I471 Supraventricular tachycardia: Secondary | ICD-10-CM

## 2019-02-26 DIAGNOSIS — I63312 Cerebral infarction due to thrombosis of left middle cerebral artery: Secondary | ICD-10-CM

## 2019-02-26 DIAGNOSIS — I10 Essential (primary) hypertension: Secondary | ICD-10-CM

## 2019-02-26 MED ORDER — METOPROLOL TARTRATE 25 MG PO TABS: 12.5000 mg | ORAL_TABLET | Freq: Two times a day (BID) | ORAL | 0 refills | Status: DC

## 2019-02-26 MED ORDER — ATORVASTATIN CALCIUM 80 MG PO TABS: 80.0000 mg | ORAL_TABLET | Freq: Every day | ORAL | 0 refills | Status: DC

## 2019-02-26 NOTE — Telephone Encounter (Signed)
Copied from [place] [id number]. Topic: Quick Communication - Rx Refill/Question >> Feb 26, 2019  4:26 PM Blase Mess A wrote: Medication: atorvastatin (LIPITOR) 80 MG tablet [[id number]] , metoprolol tartrate (LOPRESSOR) 25 MG tablet [[id number]] ,   Has the patient contacted their pharmacy? Yes  (Agent: If no, request that the patient contact the pharmacy for the refill.) (Agent: If yes, when and what did the pharmacy advise?)  Preferred Pharmacy (with phone number or street name): [facility] [phone] - [place], [place] - [address] AT [address] [phone] (Phone) [phone] (Fax)    Agent: Please be advised that RX refills may take up to 3 business days. We ask that you follow-up with your pharmacy.

## 2019-02-26 NOTE — Telephone Encounter (Signed)
30 day supplies sent. Overdue for visit.

## 2019-02-27 ENCOUNTER — Other Ambulatory Visit: Payer: Self-pay

## 2019-02-27 ENCOUNTER — Encounter: Payer: Self-pay | Admitting: Family Medicine

## 2019-02-27 ENCOUNTER — Other Ambulatory Visit: Payer: Self-pay | Admitting: Family Medicine

## 2019-02-27 ENCOUNTER — Ambulatory Visit: Payer: Medicare PPO | Admitting: Family Medicine

## 2019-02-27 DIAGNOSIS — R739 Hyperglycemia, unspecified: Secondary | ICD-10-CM

## 2019-02-27 DIAGNOSIS — G500 Trigeminal neuralgia: Secondary | ICD-10-CM | POA: Diagnosis not present

## 2019-02-27 DIAGNOSIS — I63312 Cerebral infarction due to thrombosis of left middle cerebral artery: Secondary | ICD-10-CM

## 2019-02-27 DIAGNOSIS — I10 Essential (primary) hypertension: Secondary | ICD-10-CM

## 2019-02-27 DIAGNOSIS — Z8673 Personal history of transient ischemic attack (TIA), and cerebral infarction without residual deficits: Secondary | ICD-10-CM | POA: Diagnosis not present

## 2019-02-27 MED ORDER — NICOTINE 21 MG/24HR TD PT24: 21.0000 mg | MEDICATED_PATCH | Freq: Every day | TRANSDERMAL | 1 refills | Status: DC

## 2019-02-27 MED ORDER — CLOPIDOGREL BISULFATE 75 MG PO TABS: 75.0000 mg | ORAL_TABLET | Freq: Every day | ORAL | 0 refills | Status: DC

## 2019-02-27 MED ORDER — NICOTINE 14 MG/24HR TD PT24: 14.0000 mg | MEDICATED_PATCH | Freq: Every day | TRANSDERMAL | 1 refills | Status: DC

## 2019-02-27 MED ORDER — NICOTINE 7 MG/24HR TD PT24: 7.0000 mg | MEDICATED_PATCH | Freq: Every day | TRANSDERMAL | 1 refills | Status: DC

## 2019-02-27 NOTE — Patient Instructions (Signed)
Tobacco Use Disorder Tobacco use disorder (TUD) occurs when a person craves, seeks, and uses tobacco, regardless of the consequences. This disorder can cause problems with mental and physical health. It can affect your ability to have healthy relationships, and it can keep you from meeting your responsibilities at work, home, or school. Tobacco may be:  Smoked as a cigarette or cigar.  Inhaled using e-cigarettes.  Smoked in a pipe or hookah.  Chewed as smokeless tobacco.  Inhaled into the nostrils as snuff. Tobacco products contain a dangerous chemical called nicotine, which is very addictive. Nicotine triggers hormones that make the body feel stimulated and works on areas of the brain that make you feel good. These effects can make it hard for people to quit nicotine. Tobacco contains many other unsafe chemicals that can damage almost every organ in the body. Smoking tobacco also puts others in danger due to fire risk and possible health problems caused by breathing in secondhand smoke. What are the signs or symptoms? Symptoms of TUD may include:  Being unable to slow down or stop your tobacco use.  Spending an abnormal amount of time getting or using tobacco.  Craving tobacco. Cravings may last for up to 6 months after quitting.  Tobacco use that: ? Interferes with your work, school, or home life. ? Interferes with your personal and social relationships. ? Makes you give up activities that you once enjoyed or found important.  Using tobacco even though you know that it is: ? Dangerous or bad for your health or someone else's health. ? Causing problems in your life.  Needing more and more of the substance to get the same effect (developing tolerance).  Experiencing unpleasant symptoms if you do not use the substance (withdrawal). Withdrawal symptoms may include: ? Depressed, anxious, or irritable mood. ? Difficulty concentrating. ? Increased appetite. ? Restlessness or trouble  sleeping.  Using the substance to avoid withdrawal. How is this diagnosed? This condition may be diagnosed based on:  Your current and past tobacco use. Your health care provider may ask questions about how your tobacco use affects your life.  A physical exam. You may be diagnosed with TUD if you have at least two symptoms within a 12-month period. How is this treated? This condition is treated by stopping tobacco use. Many people are unable to quit on their own and need help. Treatment may include:  Nicotine replacement therapy (NRT). NRT provides nicotine without the other harmful chemicals in tobacco. NRT gradually lowers the dosage of nicotine in the body and reduces withdrawal symptoms. NRT is available as: ? Over-the-counter gums, lozenges, and skin patches. ? Prescription mouth inhalers and nasal sprays.  Medicine that acts on the brain to reduce cravings and withdrawal symptoms.  A type of talk therapy that examines your triggers for tobacco use, how to avoid them, and how to cope with cravings (behavioral therapy).  Hypnosis. This may help with withdrawal symptoms.  Joining a support group for others coping with TUD. The best treatment for TUD is usually a combination of medicine, talk therapy, and support groups. Recovery can be a long process. Many people start using tobacco again after stopping (relapse). If you relapse, it does not mean that treatment will not work. Follow these instructions at home:  Lifestyle  Do not use any products that contain nicotine or tobacco, such as cigarettes and e-cigarettes.  Avoid things that trigger tobacco use as much as you can. Triggers include people and situations that usually cause you   to use tobacco.  Avoid drinks that contain caffeine, including coffee. These may worsen some withdrawal symptoms.  Find ways to manage stress. Wanting to smoke may cause stress, and stress can make you want to smoke. Relaxation techniques such as  deep breathing, meditation, and yoga may help.  Attend support groups as needed. These groups are an important part of long-term recovery for many people. General instructions  Take over-the-counter and prescription medicines only as told by your health care provider.  Check with your health care provider before taking any new prescription or over-the-counter medicines.  Decide on a friend, family member, or smoking quit-line (such as 1-800-QUIT-NOW in the [place] that you can call or text when you feel the urge to smoke or when you need help coping with cravings.  Keep all follow-up visits as told by your health care provider and therapist. This is important. Contact a health care provider if:  You are not able to take your medicines as prescribed.  Your symptoms get worse, even with treatment. Summary  Tobacco use disorder (TUD) occurs when a person craves, seeks, and uses tobacco regardless of the consequences.  This condition may be diagnosed based on your current and past tobacco use and a physical exam.  Many people are unable to quit on their own and need help. Recovery can be a long process.  The most effective treatment for TUD is usually a combination of medicine, talk therapy, and support groups. This information is not intended to replace advice given to you by your health care provider. Make sure you discuss any questions you have with your health care provider. Document Released: 08/04/2004 Document Revised: 11/16/2017 Document Reviewed: 11/16/2017 Elsevier Interactive Patient Education  2019 [employer]vier Inc.  

## 2019-02-28 NOTE — Assessment & Plan Note (Signed)
Improved some on repeat but he has been out of his Metoprolol so we have restarted and he will let us know BP numbers in one week.

## 2019-02-28 NOTE — Assessment & Plan Note (Signed)
Has been home from a nursing home for about a month, is here today with his daughter and he lives with a girlfriend who helps to care for him. They will help make sure he gets his medications and to appointments etc. They have missed a neurology follow up appointment so he is set up with a new referral no change in meds today

## 2019-02-28 NOTE — Assessment & Plan Note (Signed)
Manageable at this time.

## 2019-02-28 NOTE — Assessment & Plan Note (Signed)
hgba1c acceptable, minimize simple carbs. Increase exercise as tolerated.  

## 2019-02-28 NOTE — Progress Notes (Addendum)
Subjective:    Patient ID: Ronald Wolf, male    DOB: Apr 10, 1946, 73 y.o.   MRN: [id number]  Chief Complaint  Patient presents with  . Stroke    Pt states doing good since getting our if rehab and states feeling weak today due to not eating. Pt states no appetite.  . Follow-up  . Medication Refill    Plavix    HPI Patient is in today for follow up. He is here today accompanied by his daughter. He has been home from a nursing home for about a month. He has been out of his medications but he reports he feels well. No current acute concerns. No recent febrile illness. Denies CP/palp/SOB/HA/congestion/fevers/GI or GU c/o. Taking meds as prescribed  Past Medical History:  Diagnosis Date  . Abdominal pain, other specified site 09/26/2013  . Anxiety   . Arthritis   . Atherosclerosis of arteries 05/13/2013  . Cough 09/26/2013  . Depression   . Eating disorder   . GERD (gastroesophageal reflux disease)   . Hepatitis C   . Hypertension   . Lesion of liver 04/01/2013  . Loss of weight 06/21/2014  . Medial knee pain 09/26/2013  . Palpitations 06/21/2014  . Shortness of breath dyspnea    with exertion    Past Surgical History:  Procedure Laterality Date  . IR ANGIO INTRA EXTRACRAN SEL COM CAROTID INNOMINATE BILAT MOD SED  09/09/2017  . IR ANGIO VERTEBRAL SEL VERTEBRAL BILAT MOD SED  09/09/2017  . IR RADIOLOGIST EVAL & MGMT  09/07/2017  . MICROLARYNGOSCOPY WITH CO2 LASER AND EXCISION OF VOCAL CORD LESION N/A 05/14/2016   Procedure: MICROLARYNGOSCOPY WITH CO2 LASER AND EXCISION OF VOCAL CORD LESION;  Surgeon: Melida Quitter, MD;  Location: [facility];  Service: ENT;  Laterality: N/A;  Suspended micro laryngoscopy with CO2 laser excision of vocal fold lesion  . TUMOR REMOVAL  1990   bladder    Family History  Problem Relation Age of Onset  . Diabetes Mother   . Arthritis Mother   . Healthy Sister        x 2  . Cancer Brother        stomach  . Dementia Father   . Prostate cancer Neg Hx   .  Colon cancer Neg Hx   . Breast cancer Neg Hx   . Heart disease Neg Hx   . Hypertension Neg Hx     Social History   Socioeconomic History  . Marital status: Single    Spouse name: Not on file  . Number of children: 6  . Years of education: 65  . Highest education level: Not on file  Occupational History  . Not on file  Social Needs  . Financial resource strain: Not on file  . Food insecurity:    Worry: Not on file    Inability: Not on file  . Transportation needs:    Medical: Not on file    Non-medical: Not on file  Tobacco Use  . Smoking status: Current Every Day Smoker    Packs/day: 1.00    Years: 35.00    Pack years: 35.00    Types: Cigarettes  . Smokeless tobacco: Never Used  Substance and Sexual Activity  . Alcohol use: Yes    Alcohol/week: 0.0 standard drinks    Comment: 11 pint daily    none in a month and a half  . Drug use: No    Types: Marijuana  . Sexual activity: Not on  file  Lifestyle  . Physical activity:    Days per week: Not on file    Minutes per session: Not on file  . Stress: Not on file  Relationships  . Social connections:    Talks on phone: Not on file    Gets together: Not on file    Attends religious service: Not on file    Active member of club or organization: Not on file    Attends meetings of clubs or organizations: Not on file    Relationship status: Not on file  . Intimate partner violence:    Fear of current or ex partner: Not on file    Emotionally abused: Not on file    Physically abused: Not on file    Forced sexual activity: Not on file  Other Topics Concern  . Not on file  Social History Narrative   Patient drinks caffeine occasionally.   Patient is right handed   Lives alone   Wears seat belts    Outpatient Medications Prior to Visit  Medication Sig Dispense Refill  . aspirin EC 81 MG tablet Take 81 mg by mouth daily.     Marland Kitchen atorvastatin (LIPITOR) 80 MG tablet TAKE 1 TABLET(80 MG) BY MOUTH DAILY AT 6 PM 90 tablet 1   . metoprolol tartrate (LOPRESSOR) 25 MG tablet TAKE 1/2 TABLET(12.5 MG) BY MOUTH TWICE DAILY 90 tablet 1  . NUTRITIONAL SUPPLEMENT LIQD Take 120 mLs by mouth daily. [employer]    . tetrahydrozoline-zinc (VISINE-AC) 0.05-0.25 % ophthalmic solution Place 1 drop into both eyes daily as needed (dry eyes).     . clopidogrel (PLAVIX) 75 MG tablet Take 1 tablet (75 mg total) by mouth daily. 30 tablet 0  . lidocaine (XYLOCAINE) 2 % solution Use as directed 20 mLs in the mouth or throat as needed for mouth pain. 100 mL 0  . pseudoephedrine-dextromethorphan-guaifenesin (ROBITUSSIN-PE) 30-10-100 MG/5ML solution Take 10 mLs by mouth 3 (three) times daily.    . sertraline (ZOLOFT) 50 MG tablet Take 1 tablet (50 mg total) by mouth daily. 30 tablet 0   No facility-administered medications prior to visit.     Allergies  Allergen Reactions  . Iohexol Hives     Desc: pt broke out in hives years ago from iv contrast. he was given 31m benadryl po, 1 hr prior to ct and did fine today w/o complications.  JB     Review of Systems  Constitutional: Negative for fever and malaise/fatigue.  HENT: Negative for congestion.   Eyes: Negative for blurred vision.  Respiratory: Negative for shortness of breath.   Cardiovascular: Negative for chest pain, palpitations and leg swelling.  Gastrointestinal: Negative for abdominal pain, blood in stool and nausea.  Genitourinary: Negative for dysuria and frequency.  Musculoskeletal: Negative for falls.  Skin: Negative for rash.  Neurological: Negative for dizziness, loss of consciousness and headaches.  Endo/Heme/Allergies: Negative for environmental allergies.  Psychiatric/Behavioral: Positive for memory loss. Negative for depression. The patient is not nervous/anxious.        Objective:    Physical Exam Vitals signs and nursing note reviewed.  Constitutional:      General: He is not in acute distress.    Appearance: He is well-developed.     Comments: In a  wheelchair  HENT:     Head: Normocephalic and atraumatic.     Nose: Nose normal.  Eyes:     General:        Right eye: No discharge.  Left eye: No discharge.  Neck:     Musculoskeletal: Normal range of motion and neck supple.  Cardiovascular:     Rate and Rhythm: Normal rate and regular rhythm.     Heart sounds: Murmur present.  Pulmonary:     Effort: Pulmonary effort is normal.     Breath sounds: Normal breath sounds.  Abdominal:     General: Bowel sounds are normal.     Palpations: Abdomen is soft.     Tenderness: There is no abdominal tenderness.  Skin:    General: Skin is warm and dry.  Neurological:     Mental Status: He is alert and oriented to person, place, and time.     BP (!) 152/80   Pulse (!) 59   Temp 97.6 F (36.4 C) (Oral)   Resp 18   Ht _0  (1.803 m)   Wt 157 lb 12.8 oz (71.6 kg)   SpO2 98%   BMI 22.01 kg/m  Wt Readings from Last 3 Encounters:  02/27/19 157 lb 12.8 oz (71.6 kg)  01/12/19 147 lb 6.4 oz (66.9 kg)  01/09/19 147 lb 6.4 oz (66.9 kg)     Lab Results  Component Value Date   WBC 7.8 12/18/2018   HGB 13.8 12/18/2018   HCT 44.6 12/18/2018   PLT 244 12/18/2018   GLUCOSE 101 (H) 12/18/2018   CHOL 178 12/18/2018   TRIG 119 12/18/2018   HDL 41 12/18/2018   LDLCALC 113 (H) 12/18/2018   ALT 43 12/18/2018   AST 40 12/18/2018   NA 139 12/18/2018   K 3.7 12/18/2018   CL 106 12/18/2018   CREATININE 1.36 (H) 12/24/2018   BUN 15 12/18/2018   CO2 24 12/18/2018   TSH 1.46 12/20/2017   PSA 0.01 (L) 11/21/2014   INR 0.95 12/17/2018   HGBA1C 4.2 (L) 12/18/2018    Lab Results  Component Value Date   TSH 1.46 12/20/2017   Lab Results  Component Value Date   WBC 7.8 12/18/2018   HGB 13.8 12/18/2018   HCT 44.6 12/18/2018   MCV 110.4 (H) 12/18/2018   PLT 244 12/18/2018   Lab Results  Component Value Date   NA 139 12/18/2018   K 3.7 12/18/2018   CHLORIDE 108 03/08/2016   CO2 24 12/18/2018   GLUCOSE 101 (H) 12/18/2018    BUN 15 12/18/2018   CREATININE 1.36 (H) 12/24/2018   BILITOT 1.2 12/18/2018   ALKPHOS 77 12/18/2018   AST 40 12/18/2018   ALT 43 12/18/2018   PROT 7.4 12/18/2018   ALBUMIN 3.4 (L) 12/18/2018   CALCIUM 9.3 12/18/2018   ANIONGAP 9 12/18/2018   EGFR >90 03/08/2016   GFR 92.37 12/20/2017   Lab Results  Component Value Date   CHOL 178 12/18/2018   Lab Results  Component Value Date   HDL 41 12/18/2018   Lab Results  Component Value Date   LDLCALC 113 (H) 12/18/2018   Lab Results  Component Value Date   TRIG 119 12/18/2018   Lab Results  Component Value Date   CHOLHDL 4.3 12/18/2018   Lab Results  Component Value Date   HGBA1C 4.2 (L) 12/18/2018       Assessment & Plan:   Problem List Items Addressed This Visit    Trigeminal neuralgia    Manageable at this time.       Relevant Medications   nicotine (NICODERM CQ) 7 mg/24hr patch   nicotine (NICODERM CQ) 14 mg/24hr patch   nicotine (NICODERM  CQ) 21 mg/24hr patch   HTN (hypertension)    Improved some on repeat but he has been out of his Metoprolol so we have restarted and he will let us know BP numbers in one week.      Hyperglycemia    hgba1c acceptable, minimize simple carbs. Increase exercise as tolerated.      CVA (cerebral vascular accident) ([place])    Has been home from a nursing home for about a month, is here today with his daughter and he lives with a girlfriend who helps to care for him. They will help make sure he gets his medications and to appointments etc. They have missed a neurology follow up appointment so he is set up with a new referral no change in meds today      Relevant Orders   Ambulatory referral to Neurology      I have discontinued Duquan L. Berke's pseudoephedrine-dextromethorphan-guaifenesin, clopidogrel, sertraline, and lidocaine. I am also having him start on nicotine, nicotine, and nicotine. Additionally, I am having him maintain his tetrahydrozoline-zinc, aspirin EC, Nutritional  Supplement, metoprolol tartrate, and atorvastatin.  Meds ordered this encounter  Medications  . DISCONTD: clopidogrel (PLAVIX) 75 MG tablet    Sig: Take 1 tablet (75 mg total) by mouth daily.    Dispense:  30 tablet    Refill:  0  . nicotine (NICODERM CQ) 7 mg/24hr patch    Sig: Place 1 patch (7 mg total) onto the skin daily. START WHEN DONE WITH 14 MG PATCHES    Dispense:  28 patch    Refill:  1  . nicotine (NICODERM CQ) 14 mg/24hr patch    Sig: Place 1 patch (14 mg total) onto the skin daily. START AFTER DONE WITH 21 MG PATCHES    Dispense:  28 patch    Refill:  1  . nicotine (NICODERM CQ) 21 mg/24hr patch    Sig: Place 1 patch (21 mg total) onto the skin daily.    Dispense:  28 patch    Refill:  1     Penni Homans, MD

## 2019-04-16 ENCOUNTER — Telehealth: Payer: Self-pay

## 2019-04-16 NOTE — Telephone Encounter (Signed)
I contacted the pt and was able to discuss appt scheduled for 04/17/19 at 2 pm with his wife. I advised due to current COVID 19 pandemic, our office is severely reducing in office visits for at least the next 2 weeks, in order to minimize the risk to our patients and healthcare providers.  Pt was offered a video visit but was declined.  Pt has been rescheduled for face to face visit on 06/21/19 at 2 pm. I stated if our office was still under the covid 19 restrictions we wold notify him closer to his appt date.

## 2019-04-17 ENCOUNTER — Ambulatory Visit: Payer: Medicare PPO | Admitting: Neurology

## 2019-06-21 ENCOUNTER — Telehealth: Payer: Self-pay | Admitting: Neurology

## 2019-06-21 ENCOUNTER — Ambulatory Visit: Payer: Self-pay | Admitting: Neurology

## 2019-06-21 NOTE — Telephone Encounter (Signed)
This is the second new patient no-show for this patient, the patient will be discharged from our practice.

## 2019-06-25 ENCOUNTER — Encounter: Payer: Self-pay | Admitting: Neurology

## 2019-07-30 ENCOUNTER — Ambulatory Visit: Payer: Medicare PPO | Admitting: Family Medicine

## 2019-07-30 DIAGNOSIS — Z0289 Encounter for other administrative examinations: Secondary | ICD-10-CM

## 2019-09-06 ENCOUNTER — Other Ambulatory Visit: Payer: Self-pay | Admitting: Family Medicine

## 2019-09-06 DIAGNOSIS — I471 Supraventricular tachycardia: Secondary | ICD-10-CM

## 2019-09-06 DIAGNOSIS — I10 Essential (primary) hypertension: Secondary | ICD-10-CM

## 2019-09-06 DIAGNOSIS — I63312 Cerebral infarction due to thrombosis of left middle cerebral artery: Secondary | ICD-10-CM

## 2019-10-20 DIAGNOSIS — M170 Bilateral primary osteoarthritis of knee: Secondary | ICD-10-CM | POA: Diagnosis not present

## 2019-10-20 DIAGNOSIS — I10 Essential (primary) hypertension: Secondary | ICD-10-CM | POA: Diagnosis not present

## 2019-10-20 DIAGNOSIS — E559 Vitamin D deficiency, unspecified: Secondary | ICD-10-CM | POA: Diagnosis not present

## 2019-10-20 DIAGNOSIS — Z6822 Body mass index (BMI) 22.0-22.9, adult: Secondary | ICD-10-CM | POA: Diagnosis not present

## 2019-10-20 DIAGNOSIS — I739 Peripheral vascular disease, unspecified: Secondary | ICD-10-CM | POA: Diagnosis not present

## 2019-10-20 DIAGNOSIS — Z8673 Personal history of transient ischemic attack (TIA), and cerebral infarction without residual deficits: Secondary | ICD-10-CM | POA: Diagnosis not present

## 2019-10-20 DIAGNOSIS — E785 Hyperlipidemia, unspecified: Secondary | ICD-10-CM | POA: Diagnosis not present

## 2019-10-20 DIAGNOSIS — Z720 Tobacco use: Secondary | ICD-10-CM | POA: Diagnosis not present

## 2019-10-20 DIAGNOSIS — J449 Chronic obstructive pulmonary disease, unspecified: Secondary | ICD-10-CM | POA: Diagnosis not present

## 2019-11-06 ENCOUNTER — Encounter: Payer: Self-pay | Admitting: Gastroenterology

## 2019-11-20 DIAGNOSIS — Z20828 Contact with and (suspected) exposure to other viral communicable diseases: Secondary | ICD-10-CM | POA: Diagnosis not present

## 2019-12-16 ENCOUNTER — Emergency Department (HOSPITAL_COMMUNITY): Payer: Medicare PPO

## 2019-12-16 ENCOUNTER — Encounter (HOSPITAL_COMMUNITY): Payer: Self-pay | Admitting: Emergency Medicine

## 2019-12-16 ENCOUNTER — Other Ambulatory Visit: Payer: Self-pay

## 2019-12-16 ENCOUNTER — Inpatient Hospital Stay (HOSPITAL_COMMUNITY)
Admission: EM | Admit: 2019-12-16 | Discharge: 2020-01-01 | DRG: 698 | Disposition: A | Discharge status: Skilled Nursing Facility | Payer: Medicare PPO | Attending: Internal Medicine | Admitting: Internal Medicine

## 2019-12-16 DIAGNOSIS — E785 Hyperlipidemia, unspecified: Secondary | ICD-10-CM | POA: Diagnosis not present

## 2019-12-16 DIAGNOSIS — J189 Pneumonia, unspecified organism: Secondary | ICD-10-CM | POA: Diagnosis not present

## 2019-12-16 DIAGNOSIS — G40909 Epilepsy, unspecified, not intractable, without status epilepticus: Secondary | ICD-10-CM | POA: Diagnosis present

## 2019-12-16 DIAGNOSIS — Z833 Family history of diabetes mellitus: Secondary | ICD-10-CM

## 2019-12-16 DIAGNOSIS — R4182 Altered mental status, unspecified: Secondary | ICD-10-CM | POA: Diagnosis present

## 2019-12-16 DIAGNOSIS — F17210 Nicotine dependence, cigarettes, uncomplicated: Secondary | ICD-10-CM | POA: Diagnosis present

## 2019-12-16 DIAGNOSIS — R000 Tachycardia, unspecified: Secondary | ICD-10-CM | POA: Diagnosis not present

## 2019-12-16 DIAGNOSIS — I10 Essential (primary) hypertension: Secondary | ICD-10-CM | POA: Diagnosis not present

## 2019-12-16 DIAGNOSIS — I6389 Other cerebral infarction: Secondary | ICD-10-CM | POA: Diagnosis not present

## 2019-12-16 DIAGNOSIS — R400 Somnolence: Secondary | ICD-10-CM | POA: Diagnosis not present

## 2019-12-16 DIAGNOSIS — G40001 Localization-related (focal) (partial) idiopathic epilepsy and epileptic syndromes with seizures of localized onset, not intractable, with status epilepticus: Secondary | ICD-10-CM | POA: Diagnosis not present

## 2019-12-16 DIAGNOSIS — N390 Urinary tract infection, site not specified: Secondary | ICD-10-CM | POA: Diagnosis not present

## 2019-12-16 DIAGNOSIS — R2990 Unspecified symptoms and signs involving the nervous system: Secondary | ICD-10-CM

## 2019-12-16 DIAGNOSIS — A419 Sepsis, unspecified organism: Secondary | ICD-10-CM | POA: Diagnosis present

## 2019-12-16 DIAGNOSIS — Z800 Family history of malignant neoplasm of digestive organs: Secondary | ICD-10-CM

## 2019-12-16 DIAGNOSIS — Z91041 Radiographic dye allergy status: Secondary | ICD-10-CM

## 2019-12-16 DIAGNOSIS — I4510 Unspecified right bundle-branch block: Secondary | ICD-10-CM | POA: Diagnosis present

## 2019-12-16 DIAGNOSIS — Z8673 Personal history of transient ischemic attack (TIA), and cerebral infarction without residual deficits: Secondary | ICD-10-CM | POA: Diagnosis not present

## 2019-12-16 DIAGNOSIS — R1310 Dysphagia, unspecified: Secondary | ICD-10-CM | POA: Diagnosis present

## 2019-12-16 DIAGNOSIS — R4701 Aphasia: Secondary | ICD-10-CM | POA: Diagnosis not present

## 2019-12-16 DIAGNOSIS — R29718 NIHSS score 18: Secondary | ICD-10-CM | POA: Diagnosis present

## 2019-12-16 DIAGNOSIS — R488 Other symbolic dysfunctions: Secondary | ICD-10-CM | POA: Diagnosis not present

## 2019-12-16 DIAGNOSIS — M2550 Pain in unspecified joint: Secondary | ICD-10-CM | POA: Diagnosis not present

## 2019-12-16 DIAGNOSIS — Z8551 Personal history of malignant neoplasm of bladder: Secondary | ICD-10-CM

## 2019-12-16 DIAGNOSIS — R404 Transient alteration of awareness: Secondary | ICD-10-CM | POA: Diagnosis not present

## 2019-12-16 DIAGNOSIS — E876 Hypokalemia: Secondary | ICD-10-CM | POA: Diagnosis not present

## 2019-12-16 DIAGNOSIS — Z20822 Contact with and (suspected) exposure to covid-19: Secondary | ICD-10-CM | POA: Diagnosis not present

## 2019-12-16 DIAGNOSIS — I672 Cerebral atherosclerosis: Secondary | ICD-10-CM | POA: Diagnosis present

## 2019-12-16 DIAGNOSIS — I639 Cerebral infarction, unspecified: Secondary | ICD-10-CM | POA: Diagnosis not present

## 2019-12-16 DIAGNOSIS — R29810 Facial weakness: Secondary | ICD-10-CM | POA: Diagnosis not present

## 2019-12-16 DIAGNOSIS — R739 Hyperglycemia, unspecified: Secondary | ICD-10-CM | POA: Diagnosis present

## 2019-12-16 DIAGNOSIS — F329 Major depressive disorder, single episode, unspecified: Secondary | ICD-10-CM | POA: Diagnosis present

## 2019-12-16 DIAGNOSIS — G9341 Metabolic encephalopathy: Secondary | ICD-10-CM | POA: Diagnosis not present

## 2019-12-16 DIAGNOSIS — I160 Hypertensive urgency: Secondary | ICD-10-CM | POA: Diagnosis present

## 2019-12-16 DIAGNOSIS — I6523 Occlusion and stenosis of bilateral carotid arteries: Secondary | ICD-10-CM | POA: Diagnosis not present

## 2019-12-16 DIAGNOSIS — Z7902 Long term (current) use of antithrombotics/antiplatelets: Secondary | ICD-10-CM

## 2019-12-16 DIAGNOSIS — Z7982 Long term (current) use of aspirin: Secondary | ICD-10-CM

## 2019-12-16 DIAGNOSIS — R918 Other nonspecific abnormal finding of lung field: Secondary | ICD-10-CM | POA: Diagnosis not present

## 2019-12-16 DIAGNOSIS — R52 Pain, unspecified: Secondary | ICD-10-CM | POA: Diagnosis not present

## 2019-12-16 DIAGNOSIS — R401 Stupor: Secondary | ICD-10-CM | POA: Diagnosis not present

## 2019-12-16 DIAGNOSIS — F1021 Alcohol dependence, in remission: Secondary | ICD-10-CM | POA: Diagnosis present

## 2019-12-16 DIAGNOSIS — R509 Fever, unspecified: Secondary | ICD-10-CM | POA: Diagnosis not present

## 2019-12-16 DIAGNOSIS — R5381 Other malaise: Secondary | ICD-10-CM | POA: Diagnosis not present

## 2019-12-16 DIAGNOSIS — E870 Hyperosmolality and hypernatremia: Secondary | ICD-10-CM | POA: Diagnosis not present

## 2019-12-16 DIAGNOSIS — R262 Difficulty in walking, not elsewhere classified: Secondary | ICD-10-CM | POA: Diagnosis not present

## 2019-12-16 DIAGNOSIS — I119 Hypertensive heart disease without heart failure: Secondary | ICD-10-CM | POA: Diagnosis present

## 2019-12-16 DIAGNOSIS — G92 Toxic encephalopathy: Secondary | ICD-10-CM | POA: Diagnosis not present

## 2019-12-16 DIAGNOSIS — R1312 Dysphagia, oropharyngeal phase: Secondary | ICD-10-CM | POA: Diagnosis not present

## 2019-12-16 DIAGNOSIS — R64 Cachexia: Secondary | ICD-10-CM | POA: Diagnosis not present

## 2019-12-16 DIAGNOSIS — K219 Gastro-esophageal reflux disease without esophagitis: Secondary | ICD-10-CM | POA: Diagnosis present

## 2019-12-16 DIAGNOSIS — Z8521 Personal history of malignant neoplasm of larynx: Secondary | ICD-10-CM

## 2019-12-16 DIAGNOSIS — R569 Unspecified convulsions: Secondary | ICD-10-CM | POA: Diagnosis not present

## 2019-12-16 DIAGNOSIS — F1910 Other psychoactive substance abuse, uncomplicated: Secondary | ICD-10-CM | POA: Diagnosis present

## 2019-12-16 DIAGNOSIS — F0150 Vascular dementia without behavioral disturbance: Secondary | ICD-10-CM | POA: Diagnosis present

## 2019-12-16 DIAGNOSIS — Y846 Urinary catheterization as the cause of abnormal reaction of the patient, or of later complication, without mention of misadventure at the time of the procedure: Secondary | ICD-10-CM | POA: Diagnosis present

## 2019-12-16 DIAGNOSIS — I69351 Hemiplegia and hemiparesis following cerebral infarction affecting right dominant side: Secondary | ICD-10-CM | POA: Diagnosis not present

## 2019-12-16 DIAGNOSIS — Z8619 Personal history of other infectious and parasitic diseases: Secondary | ICD-10-CM

## 2019-12-16 DIAGNOSIS — I6629 Occlusion and stenosis of unspecified posterior cerebral artery: Secondary | ICD-10-CM | POA: Diagnosis present

## 2019-12-16 DIAGNOSIS — T83598A Infection and inflammatory reaction due to other prosthetic device, implant and graft in urinary system, initial encounter: Secondary | ICD-10-CM | POA: Diagnosis not present

## 2019-12-16 DIAGNOSIS — R269 Unspecified abnormalities of gait and mobility: Secondary | ICD-10-CM | POA: Diagnosis not present

## 2019-12-16 DIAGNOSIS — B379 Candidiasis, unspecified: Secondary | ICD-10-CM | POA: Diagnosis not present

## 2019-12-16 DIAGNOSIS — Z8261 Family history of arthritis: Secondary | ICD-10-CM

## 2019-12-16 DIAGNOSIS — M6281 Muscle weakness (generalized): Secondary | ICD-10-CM | POA: Diagnosis not present

## 2019-12-16 DIAGNOSIS — I6622 Occlusion and stenosis of left posterior cerebral artery: Secondary | ICD-10-CM | POA: Diagnosis not present

## 2019-12-16 DIAGNOSIS — Z7401 Bed confinement status: Secondary | ICD-10-CM | POA: Diagnosis not present

## 2019-12-16 DIAGNOSIS — R531 Weakness: Secondary | ICD-10-CM | POA: Diagnosis not present

## 2019-12-16 DIAGNOSIS — F445 Conversion disorder with seizures or convulsions: Secondary | ICD-10-CM | POA: Diagnosis not present

## 2019-12-16 DIAGNOSIS — I63319 Cerebral infarction due to thrombosis of unspecified middle cerebral artery: Secondary | ICD-10-CM

## 2019-12-16 DIAGNOSIS — R29818 Other symptoms and signs involving the nervous system: Secondary | ICD-10-CM | POA: Diagnosis not present

## 2019-12-16 DIAGNOSIS — Z6822 Body mass index (BMI) 22.0-22.9, adult: Secondary | ICD-10-CM

## 2019-12-16 DIAGNOSIS — Z79899 Other long term (current) drug therapy: Secondary | ICD-10-CM

## 2019-12-16 LAB — COMPREHENSIVE METABOLIC PANEL
ALT: 40 U/L (ref 0–44)
AST: 36 U/L (ref 15–41)
Albumin: 3.7 g/dL (ref 3.5–5.0)
Alkaline Phosphatase: 100 U/L (ref 38–126)
Anion gap: 10 (ref 5–15)
BUN: 16 mg/dL (ref 8–23)
CO2: 24 mmol/L (ref 22–32)
Calcium: 9.5 mg/dL (ref 8.9–10.3)
Chloride: 111 mmol/L (ref 98–111)
Creatinine, Ser: 1.14 mg/dL (ref 0.61–1.24)
GFR calc Af Amer: >60 mL/min (ref >60)
GFR calc non Af Amer: >60 mL/min (ref >60)
Glucose, Bld: 131 mg/dL — ABNORMAL HIGH (ref 70–99)
Potassium: 3.6 mmol/L (ref 3.5–5.1)
Sodium: 145 mmol/L (ref 135–145)
Total Bilirubin: 0.9 mg/dL (ref 0.3–1.2)
Total Protein: 7.5 g/dL (ref 6.5–8.1)

## 2019-12-16 LAB — DIFFERENTIAL
Abs Immature Granulocytes: 0.06 10*3/uL (ref 0.00–0.07)
Basophils Absolute: 0.1 10*3/uL (ref 0.0–0.1)
Basophils Relative: 0 %
Eosinophils Absolute: 0.1 10*3/uL (ref 0.0–0.5)
Eosinophils Relative: 1 %
Immature Granulocytes: 1 %
Lymphocytes Relative: 36 %
Lymphs Abs: 4 10*3/uL (ref 0.7–4.0)
Monocytes Absolute: 1.1 10*3/uL — ABNORMAL HIGH (ref 0.1–1.0)
Monocytes Relative: 10 %
Neutro Abs: 5.9 10*3/uL (ref 1.7–7.7)
Neutrophils Relative %: 52 %

## 2019-12-16 LAB — CBC
HCT: 43.4 % (ref 39.0–52.0)
Hemoglobin: 13.8 g/dL (ref 13.0–17.0)
MCH: 35.2 pg — ABNORMAL HIGH (ref 26.0–34.0)
MCHC: 31.8 g/dL (ref 30.0–36.0)
MCV: 110.7 fL — ABNORMAL HIGH (ref 80.0–100.0)
Platelets: 286 10*3/uL (ref 150–400)
RBC: 3.92 MIL/uL — ABNORMAL LOW (ref 4.22–5.81)
RDW: 12.8 % (ref 11.5–15.5)
WBC: 11.1 10*3/uL — ABNORMAL HIGH (ref 4.0–10.5)
nRBC: 0 % (ref 0.0–0.2)

## 2019-12-16 LAB — SARS CORONAVIRUS 2 (TAT 6-24 HRS): SARS Coronavirus 2: NEGATIVE

## 2019-12-16 LAB — TSH: TSH: 1.4 u[IU]/mL (ref 0.350–4.500)

## 2019-12-16 LAB — AMMONIA: Ammonia: 52 umol/L — ABNORMAL HIGH (ref 9–35)

## 2019-12-16 LAB — I-STAT CHEM 8, ED
BUN: 18 mg/dL (ref 8–23)
Calcium, Ion: 1.3 mmol/L (ref 1.15–1.40)
Chloride: 110 mmol/L (ref 98–111)
Creatinine, Ser: 1.1 mg/dL (ref 0.61–1.24)
Glucose, Bld: 121 mg/dL — ABNORMAL HIGH (ref 70–99)
HCT: 43 % (ref 39.0–52.0)
Hemoglobin: 14.6 g/dL (ref 13.0–17.0)
Potassium: 3.4 mmol/L — ABNORMAL LOW (ref 3.5–5.1)
Sodium: 145 mmol/L (ref 135–145)
TCO2: 26 mmol/L (ref 22–32)

## 2019-12-16 LAB — APTT: aPTT: 24 seconds (ref 24–36)

## 2019-12-16 LAB — PROTIME-INR
INR: 1 (ref 0.8–1.2)
Prothrombin Time: 12.8 seconds (ref 11.4–15.2)

## 2019-12-16 LAB — CBG MONITORING, ED: Glucose-Capillary: 117 mg/dL — ABNORMAL HIGH (ref 70–99)

## 2019-12-16 MED ORDER — ACETAMINOPHEN 650 MG RE SUPP
650.0000 mg | RECTAL | Status: DC | PRN
  Administered 2019-12-17 – 2019-12-18 (×4): 650 mg via RECTAL
  Filled 2019-12-16 (×4): qty 1

## 2019-12-16 MED ORDER — ACETAMINOPHEN 325 MG PO TABS
650.0000 mg | ORAL_TABLET | ORAL | Status: DC | PRN
  Administered 2019-12-19 – 2019-12-22 (×2): 650 mg via ORAL
  Filled 2019-12-16 (×2): qty 2

## 2019-12-16 MED ORDER — LEVETIRACETAM IN NACL 1500 MG/100ML IV SOLN
1500.0000 mg | Freq: Once | INTRAVENOUS | Status: AC
  Administered 2019-12-16: 1500 mg via INTRAVENOUS
  Filled 2019-12-16: qty 100

## 2019-12-16 MED ORDER — LORAZEPAM 2 MG/ML IJ SOLN: 2.0000 mg | Freq: Once | INTRAMUSCULAR | Status: AC

## 2019-12-16 MED ORDER — LEVETIRACETAM IN NACL 500 MG/100ML IV SOLN
500.0000 mg | Freq: Two times a day (BID) | INTRAVENOUS | Status: DC
  Administered 2019-12-16 – 2019-12-24 (×16): 500 mg via INTRAVENOUS
  Filled 2019-12-16 (×19): qty 100

## 2019-12-16 MED ORDER — ASPIRIN EC 81 MG PO TBEC: 81.0000 mg | DELAYED_RELEASE_TABLET | Freq: Every day | ORAL | Status: DC

## 2019-12-16 MED ORDER — CLOPIDOGREL BISULFATE 75 MG PO TABS
75.0000 mg | ORAL_TABLET | Freq: Every day | ORAL | Status: DC
  Administered 2019-12-20 – 2020-01-01 (×13): 75 mg via ORAL
  Filled 2019-12-16 (×15): qty 1

## 2019-12-16 MED ORDER — DIPHENHYDRAMINE HCL 50 MG/ML IJ SOLN
25.0000 mg | Freq: Once | INTRAMUSCULAR | Status: AC
  Administered 2019-12-16: 25 mg via INTRAVENOUS

## 2019-12-16 MED ORDER — LORAZEPAM 2 MG/ML IJ SOLN
1.0000 mg | INTRAMUSCULAR | Status: DC | PRN
  Administered 2019-12-17: 2 mg via INTRAVENOUS
  Filled 2019-12-16: qty 1

## 2019-12-16 MED ORDER — NICOTINE 21 MG/24HR TD PT24
21.0000 mg | MEDICATED_PATCH | Freq: Every day | TRANSDERMAL | Status: DC
  Administered 2019-12-16 – 2020-01-01 (×17): 21 mg via TRANSDERMAL
  Filled 2019-12-16 (×17): qty 1

## 2019-12-16 MED ORDER — THIAMINE HCL 100 MG/ML IJ SOLN
100.0000 mg | Freq: Every day | INTRAMUSCULAR | Status: DC
  Administered 2019-12-17 – 2019-12-28 (×5): 100 mg via INTRAVENOUS
  Filled 2019-12-16 (×6): qty 2

## 2019-12-16 MED ORDER — FOLIC ACID 1 MG PO TABS
1.0000 mg | ORAL_TABLET | Freq: Every day | ORAL | Status: DC
  Administered 2019-12-20 – 2020-01-01 (×13): 1 mg via ORAL
  Filled 2019-12-16 (×15): qty 1

## 2019-12-16 MED ORDER — SODIUM CHLORIDE 0.9% FLUSH
3.0000 mL | Freq: Once | INTRAVENOUS | Status: AC
Start: 2019-12-16 — End: 2019-12-16
  Administered 2019-12-16: 3 mL via INTRAVENOUS

## 2019-12-16 MED ORDER — SENNOSIDES-DOCUSATE SODIUM 8.6-50 MG PO TABS: 1.0000 | ORAL_TABLET | Freq: Every evening | ORAL | Status: DC | PRN

## 2019-12-16 MED ORDER — LORAZEPAM 2 MG/ML IJ SOLN
INTRAMUSCULAR | Status: AC
  Administered 2019-12-16: 2 mg via INTRAVENOUS
  Filled 2019-12-16: qty 1

## 2019-12-16 MED ORDER — IOHEXOL 350 MG/ML SOLN
125.0000 mL | Freq: Once | INTRAVENOUS | Status: AC | PRN
  Administered 2019-12-16: 125 mL via INTRAVENOUS

## 2019-12-16 MED ORDER — LORAZEPAM 1 MG PO TABS: 1.0000 mg | ORAL_TABLET | ORAL | Status: DC | PRN

## 2019-12-16 MED ORDER — ATORVASTATIN CALCIUM 80 MG PO TABS
80.0000 mg | ORAL_TABLET | Freq: Every day | ORAL | Status: DC
  Administered 2019-12-19 – 2020-01-01 (×14): 80 mg via ORAL
  Filled 2019-12-16 (×15): qty 1

## 2019-12-16 MED ORDER — SODIUM CHLORIDE 0.9 % IV SOLN: INTRAVENOUS | Status: DC

## 2019-12-16 MED ORDER — ADULT MULTIVITAMIN W/MINERALS CH
1.0000 | ORAL_TABLET | Freq: Every day | ORAL | Status: DC
  Administered 2019-12-20 – 2020-01-01 (×13): 1 via ORAL
  Filled 2019-12-16 (×15): qty 1

## 2019-12-16 MED ORDER — THIAMINE HCL 100 MG PO TABS
100.0000 mg | ORAL_TABLET | Freq: Every day | ORAL | Status: DC
  Administered 2019-12-20 – 2020-01-01 (×11): 100 mg via ORAL
  Filled 2019-12-16 (×14): qty 1

## 2019-12-16 MED ORDER — STROKE: EARLY STAGES OF RECOVERY BOOK: Freq: Once | Status: DC

## 2019-12-16 MED ORDER — ENOXAPARIN SODIUM 40 MG/0.4ML ~~LOC~~ SOLN
40.0000 mg | SUBCUTANEOUS | Status: DC
  Administered 2019-12-16 – 2020-01-01 (×17): 40 mg via SUBCUTANEOUS
  Filled 2019-12-16 (×16): qty 0.4

## 2019-12-16 MED ORDER — ACETAMINOPHEN 160 MG/5ML PO SOLN: 650.0000 mg | ORAL | Status: DC | PRN

## 2019-12-16 NOTE — ED Notes (Signed)
Patient started to have bilateral eye and face twitching. Providers notified and at bedside. Lasted 60 seconds and Ativan ordered 2 mh ordered.

## 2019-12-16 NOTE — H&P (Signed)
History and Physical    Ronald Wolf [id number] DOB: 1946/11/14 DOA: 12/16/2019  PCP: Mosie Lukes, MD Consultants:  Jannifer Franklin - neurology Patient coming from: Home - lives with girlfriend; NOK: Daughter, [name], [phone]  Chief Complaint: code stroke  HPI: Ronald Wolf is a 74 y.o. 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  He is unable to provide history.    I spoke with the patient's daughter.  Her brother found him today about 1pm unresponsive.  Yesterday, he was alert, counted out money, was weak because he hadn't eaten in 3 days - his girlfriend is his caregiver and went out of town (without notifying family).  He ate well yesterday after that.  No one would have seen him in the interim.  No known deficits from prior strokes other than vascular dementia.   ED Course:  New CVA, worsening seizures.  Called Code Stroke.  Seen by neuro, nothing obvious on CTA.  Needs MRI and seizure meds - loaded with Keppra and needs 500 mg BID.  Has had persistent seizure activity in the ER x 2 prior to [place].  Non-verbal, which is not baseline.  Review of Systems: Unable to perform  Ambulatory Status: Ambulates without assistance  Past Medical History:  Diagnosis Date  . Abdominal pain, other specified site 09/26/2013  . Anxiety   . Arthritis   . Atherosclerosis of arteries 05/13/2013  . Cough 09/26/2013  . Depression   . Eating disorder   . GERD (gastroesophageal reflux disease)   . Hepatitis C   . Hypertension   . Lesion of liver 04/01/2013  . Loss of weight 06/21/2014  . Medial knee pain 09/26/2013  . Palpitations 06/21/2014  . Shortness of breath dyspnea    with exertion    Past Surgical History:  Procedure Laterality Date  . IR ANGIO INTRA  EXTRACRAN SEL COM CAROTID INNOMINATE BILAT MOD SED  09/09/2017  . IR ANGIO VERTEBRAL SEL VERTEBRAL BILAT MOD SED  09/09/2017  . IR RADIOLOGIST EVAL & MGMT  09/07/2017  . MICROLARYNGOSCOPY WITH CO2 LASER AND EXCISION OF VOCAL CORD LESION N/A 05/14/2016   Procedure: MICROLARYNGOSCOPY WITH CO2 LASER AND EXCISION OF VOCAL CORD LESION;  Surgeon: Melida Quitter, MD;  Location: [facility];  Service: ENT;  Laterality: N/A;  Suspended micro laryngoscopy with CO2 laser excision of vocal fold lesion  . TUMOR REMOVAL  1990   bladder    Social History   Socioeconomic History  . Marital status: Single    Spouse name: Not on file  . Number of children: 6  . Years of education: 44  . Highest education level: Not on file  Occupational History  . Not on file  Tobacco Use  . Smoking status: Current Every Day Smoker    Packs/day: 2.00    Years: 60.00    Pack years: 120.00    Types: Cigarettes  . Smokeless tobacco: Never Used  Substance and Sexual Activity  . Alcohol use: Yes    Alcohol/week: 0.0 standard drinks    Comment:  drinks brandy daily  . Drug use: Yes    Types: Marijuana    Comment: "the legal kind"  . Sexual activity: Not on file  Other Topics Concern  . Not on file  Social History Narrative   Patient drinks caffeine  occasionally.   Patient is right handed   Lives alone   Wears seat belts   Social Determinants of Health   Financial Resource Strain:   . Difficulty of Paying Living Expenses: Not on file  Food Insecurity:   . Worried About Charity fundraiser in the Last Year: Not on file  . Ran Out of Food in the Last Year: Not on file  Transportation Needs:   . Lack of Transportation (Medical): Not on file  . Lack of Transportation (Non-Medical): Not on file  Physical Activity:   . Days of Exercise per Week: Not on file  . Minutes of Exercise per Session: Not on file  Stress:   . Feeling of Stress : Not on file  Social Connections:   . Frequency of Communication with Friends and  Family: Not on file  . Frequency of Social Gatherings with Friends and Family: Not on file  . Attends Religious Services: Not on file  . Active Member of Clubs or Organizations: Not on file  . Attends Archivist Meetings: Not on file  . Marital Status: Not on file  Intimate Partner Violence:   . Fear of Current or Ex-Partner: Not on file  . Emotionally Abused: Not on file  . Physically Abused: Not on file  . Sexually Abused: Not on file    Allergies  Allergen Reactions  . Iohexol Hives     Desc: pt broke out in hives years ago from iv contrast. he was given 50mg  benadryl po, 1 hr prior to ct and did fine today w/o complications.  JB     Family History  Problem Relation Age of Onset  . Diabetes Mother   . Arthritis Mother   . Healthy Sister        x 2  . Cancer Brother        stomach  . Dementia Father   . Prostate cancer Neg Hx   . Colon cancer Neg Hx   . Breast cancer Neg Hx   . Heart disease Neg Hx   . Hypertension Neg Hx     Prior to Admission medications   Medication Sig Start Date End Date Taking? Authorizing Provider  aspirin EC 81 MG tablet Take 81 mg by mouth daily.     [provider]  atorvastatin (LIPITOR) 80 MG tablet TAKE 1 TABLET(80 MG) BY MOUTH DAILY AT 6 PM Patient taking differently: Take 80 mg by mouth daily at 6 PM.  09/06/19   Mosie Lukes, MD  clopidogrel (PLAVIX) 75 MG tablet TAKE 1 TABLET(75 MG) BY MOUTH DAILY Patient taking differently: Take 75 mg by mouth daily.  09/06/19   Mosie Lukes, MD  metoprolol tartrate (LOPRESSOR) 25 MG tablet TAKE 1/2 TABLET(12.5 MG) BY MOUTH TWICE DAILY Patient taking differently: Take 12.5 mg by mouth 2 (two) times daily.  09/06/19   Mosie Lukes, MD  nicotine (NICODERM CQ) 14 mg/24hr patch Place 1 patch (14 mg total) onto the skin daily. START AFTER DONE WITH 21 MG PATCHES 02/27/19   Mosie Lukes, MD  nicotine (NICODERM CQ) 21 mg/24hr patch Place 1 patch (21 mg total) onto the skin daily.  02/27/19   Mosie Lukes, MD  nicotine (NICODERM CQ) 7 mg/24hr patch Place 1 patch (7 mg total) onto the skin daily. START WHEN DONE WITH 14 MG PATCHES 02/27/19   Mosie Lukes, MD  NUTRITIONAL SUPPLEMENT LIQD Take 120 mLs by mouth daily. [employer]  [provider]  tetrahydrozoline-zinc (VISINE-AC) 0.05-0.25 % ophthalmic solution Place 1 drop into both eyes daily as needed (dry eyes).     [provider]    Physical Exam: Vitals:   12/16/19 1620 12/16/19 1630 12/16/19 1645 12/16/19 1700  BP: (!) 213/111 (!) 211/104 (!) 215/105 (!) 205/106  Pulse: (!) 127 (!) 124 (!) 128 (!) 128  Resp: 16 15 (!) 24 19  Temp:      TempSrc:      SpO2: 100% 100% 100% 100%  Weight:      Height:         . General:  Sleeping comfortably at the time of my exam, after Ativan; per neurology note, he was awake but not responsive to stimuli . Eyes:  PERRL when forced open, normal lids, iris . ENT:  grossly normal lips & tongue, mmm . Neck:  no LAD, masses or thyromegaly . Cardiovascular:  RR with tachycardia, no m/r/g. No LE edema.  Marland Kitchen Respiratory:   CTA bilaterally with no wheezes/rales/rhonchi.  Normal respiratory effort. . Abdomen:  soft, NT, ND, NABS . Skin:  no rash or induration seen on limited exam . Musculoskeletal:  Unable to effectively assess strength/tone, no bony abnormality . Psychiatric:  Sedated and unresponsive . Neurologic:  Unable to perform, no obvious facial droop    Radiological Exams on Admission: CT Code Stroke CTA Head W/WO contrast  Result Date: 12/16/2019 EXAM: CT ANGIOGRAPHY HEAD AND NECK CT PERFUSION BRAIN TECHNIQUE: Multidetector CT imaging of the head and neck was performed using the standard protocol during bolus administration of intravenous contrast. Multiplanar CT image reconstructions and MIPs were obtained to evaluate the vascular anatomy. Carotid stenosis measurements (when applicable) are obtained utilizing NASCET criteria, using the distal internal  carotid diameter as the denominator. Multiphase CT imaging of the brain was performed following IV bolus contrast injection. Subsequent parametric perfusion maps were calculated using RAPID software. CONTRAST:  121mL OMNIPAQUE IOHEXOL 350 MG/ML SOLN COMPARISON:  Head CT same day.  CT angiography 08/24/2017. FINDINGS: CTA NECK FINDINGS Aortic arch: There is aortic atherosclerosis. The scan does not include the brachiocephalic vessel origins other than the left subclavian origin which is widely patent. Right carotid system: Common carotid artery widely patent to the bifurcation. Atherosclerotic disease at the carotid bifurcation and ICA bulb but no stenosis of the bulb beyond that of more distal cervical ICA. Left carotid system: Common carotid artery widely patent to the bifurcation. Atherosclerotic plaque at the carotid bifurcation and ICA bulb. Minimal diameter of the ICA bulb is 4 mm. Compared to a more distal cervical ICA diameter of 4.2 mm, this does not represent any significant stenosis. Vertebral arteries: 30% stenosis at both vertebral artery origins. Beyond the origins, both vessels are widely patent through the cervical region to the foramen magnum. Skeleton: Ordinary cervical spondylosis and facet osteoarthritis. Other neck: No mass or lymphadenopathy. Upper chest: Scarring and emphysematous changes at the apices. No acute process. Review of the MIP images confirms the above findings CTA HEAD FINDINGS Anterior circulation: Internal carotid arteries are patent through the carotid siphon regions, but there is severe atherosclerotic narrowing and irregularity, particularly on the left where there appears to be greater than 80% stenosis. Stenosis on the right estimated at 50-70%. Supraclinoid internal carotid arteries are widely patent. Both anterior cerebral arteries are widely patent. There is atherosclerotic disease of both M1 segments, 50% stenosis on the right and serial irregular 50-70% stenoses on the  left. I do not see any acute  distal large or medium vessel occlusion however. Posterior circulation: Dominant right vertebral artery widely patent to the basilar. Small left vertebral artery largely terminates in PICA. Mild narrowing and irregularity of the basilar without flow limiting stenosis. Superior cerebellar and posterior cerebral arteries are patent. 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 Severe stenosis of the left P1 segment. Venous sinuses: Patent and normal. Anatomic variants: None significant. 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 No sign of acute infarction by head CT. CBF (<30%) Volume: 64mL Perfusion (Tmax>6.0s) volume: 30mL Mismatch Volume: 103mL Infarction Location:None IMPRESSION: No acute large vessel occlusion. Atherosclerotic disease at both carotid bifurcations. No significant stenosis. 30% stenosis at both vertebral artery origins. Severe atherosclerotic disease in both carotid siphon regions, worse on the left than the right. On the left, stenosis appears to be greater than 80%. On the right, stenosis is estimated at 50-70%. Atherosclerotic disease of both M1 segments. 50% stenosis on the right. Serial irregular 50-70% stenoses on the left. Atherosclerotic narrowing of both PCA branches as seen previously, with a severe stenosis of the P1 segment on the left. Electronically Signed   By: Nelson Chimes M.D.   On: 12/16/2019 15:57   CT Code Stroke CTA Neck W/WO contrast  Result Date: 12/16/2019 EXAM: CT ANGIOGRAPHY HEAD AND NECK CT PERFUSION BRAIN TECHNIQUE: Multidetector CT imaging of the head and neck was performed using the standard protocol during bolus administration of intravenous contrast. Multiplanar CT image reconstructions and MIPs were obtained to evaluate the vascular anatomy. Carotid stenosis measurements (when applicable) are  obtained utilizing NASCET criteria, using the distal internal carotid diameter as the denominator. Multiphase CT imaging of the brain was performed following IV bolus contrast injection. Subsequent parametric perfusion maps were calculated using RAPID software. CONTRAST:  185mL OMNIPAQUE IOHEXOL 350 MG/ML SOLN COMPARISON:  Head CT same day.  CT angiography 08/24/2017. FINDINGS: CTA NECK FINDINGS Aortic arch: There is aortic atherosclerosis. The scan does not include the brachiocephalic vessel origins other than the left subclavian origin which is widely patent. Right carotid system: Common carotid artery widely patent to the bifurcation. Atherosclerotic disease at the carotid bifurcation and ICA bulb but no stenosis of the bulb beyond that of more distal cervical ICA. Left carotid system: Common carotid artery widely patent to the bifurcation. Atherosclerotic plaque at the carotid bifurcation and ICA bulb. Minimal diameter of the ICA bulb is 4 mm. Compared to a more distal cervical ICA diameter of 4.2 mm, this does not represent any significant stenosis. Vertebral arteries: 30% stenosis at both vertebral artery origins. Beyond the origins, both vessels are widely patent through the cervical region to the foramen magnum. Skeleton: Ordinary cervical spondylosis and facet osteoarthritis. Other neck: No mass or lymphadenopathy. Upper chest: Scarring and emphysematous changes at the apices. No acute process. Review of the MIP images confirms the above findings CTA HEAD FINDINGS Anterior circulation: Internal carotid arteries are patent through the carotid siphon regions, but there is severe atherosclerotic narrowing and irregularity, particularly on the left where there appears to be greater than 80% stenosis. Stenosis on the right estimated at 50-70%. Supraclinoid internal carotid arteries are widely patent. Both anterior cerebral arteries are widely patent. There is atherosclerotic disease of both M1 segments, 50%  stenosis on the right and serial irregular 50-70% stenoses on the left. I do not see any acute distal large or medium vessel  occlusion however. Posterior circulation: Dominant right vertebral artery widely patent to the basilar. Small left vertebral artery largely terminates in PICA. Mild narrowing and irregularity of the basilar without flow limiting stenosis. Superior cerebellar and posterior cerebral arteries are patent. 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 Severe stenosis of the left P1 segment. Venous sinuses: Patent and normal. Anatomic variants: None significant. 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 No sign of acute infarction by head CT. CBF (<30%) Volume: 26mL Perfusion (Tmax>6.0s) volume: 42mL Mismatch Volume: 27mL Infarction Location:None IMPRESSION: No acute large vessel occlusion. Atherosclerotic disease at both carotid bifurcations. No significant stenosis. 30% stenosis at both vertebral artery origins. Severe atherosclerotic disease in both carotid siphon regions, worse on the left than the right. On the left, stenosis appears to be greater than 80%. On the right, stenosis is estimated at 50-70%. Atherosclerotic disease of both M1 segments. 50% stenosis on the right. Serial irregular 50-70% stenoses on the left. Atherosclerotic narrowing of both PCA branches as seen previously, with a severe stenosis of the P1 segment on the left. Electronically Signed   By: Nelson Chimes M.D.   On: 12/16/2019 15:57   MR Brain Wo Contrast (neuro protocol)  Result Date: 12/16/2019 CLINICAL DATA:  Right arm weakness and gait disturbance. Abnormal gaze. Old infarctions left MCA territory. EXAM: MRI HEAD WITHOUT CONTRAST TECHNIQUE: Multiplanar, multiecho pulse sequences of the brain and surrounding structures were obtained without intravenous contrast. COMPARISON:  CT  studies earlier same day.  MRI 04/28/2019 FINDINGS: Brain: The study suffers from some motion degradation. No sign of acute infarction based on diffusion imaging. Punctate focus of restricted diffusion in the left posterior frontal region and left parietal region in the areas of old infarction not likely related to acute insult. No brainstem or cerebellar infarction. Right cerebral hemisphere shows mild chronic small-vessel change of the deep white matter. On the left, there is an extensive old left MCA territory infarction affecting the left basal ganglia, external capsule and frontoparietal brain. Hemosiderin deposition in the regions of old left MCA infarction. Volume loss with ex vacuo enlargement of the left lateral ventricle. Mass lesion or extra-axial collection Vascular: Major vessels at the base of the brain show flow. 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 No sign of acute or subacute infarction. 2 small foci of restricted diffusion within the regions of old infarction in the left posterior frontal and parietal region not felt to relate to acute insult. Hemosiderin deposition in the regions of the old left MCA infarction. Electronically Signed   By: Nelson Chimes M.D.   On: 12/16/2019 17:53   CT Code Stroke Cerebral Perfusion with contrast  Result Date: 12/16/2019 EXAM: CT ANGIOGRAPHY HEAD AND NECK CT PERFUSION BRAIN TECHNIQUE: Multidetector CT imaging of the head and neck was performed using the standard protocol during bolus administration of intravenous contrast. Multiplanar CT image reconstructions and MIPs were obtained to evaluate the vascular anatomy. Carotid stenosis measurements (when applicable) are obtained utilizing NASCET criteria, using the distal internal carotid diameter as the denominator. Multiphase CT imaging of the brain was performed following IV bolus contrast injection.  Subsequent parametric perfusion maps were calculated using RAPID software. CONTRAST:  165mL OMNIPAQUE IOHEXOL 350 MG/ML SOLN COMPARISON:  Head CT same day.  CT angiography 08/24/2017. FINDINGS: CTA NECK  FINDINGS Aortic arch: There is aortic atherosclerosis. The scan does not include the brachiocephalic vessel origins other than the left subclavian origin which is widely patent. Right carotid system: Common carotid artery widely patent to the bifurcation. Atherosclerotic disease at the carotid bifurcation and ICA bulb but no stenosis of the bulb beyond that of more distal cervical ICA. Left carotid system: Common carotid artery widely patent to the bifurcation. Atherosclerotic plaque at the carotid bifurcation and ICA bulb. Minimal diameter of the ICA bulb is 4 mm. Compared to a more distal cervical ICA diameter of 4.2 mm, this does not represent any significant stenosis. Vertebral arteries: 30% stenosis at both vertebral artery origins. Beyond the origins, both vessels are widely patent through the cervical region to the foramen magnum. Skeleton: Ordinary cervical spondylosis and facet osteoarthritis. Other neck: No mass or lymphadenopathy. Upper chest: Scarring and emphysematous changes at the apices. No acute process. Review of the MIP images confirms the above findings CTA HEAD FINDINGS Anterior circulation: Internal carotid arteries are patent through the carotid siphon regions, but there is severe atherosclerotic narrowing and irregularity, particularly on the left where there appears to be greater than 80% stenosis. Stenosis on the right estimated at 50-70%. Supraclinoid internal carotid arteries are widely patent. Both anterior cerebral arteries are widely patent. There is atherosclerotic disease of both M1 segments, 50% stenosis on the right and serial irregular 50-70% stenoses on the left. I do not see any acute distal large or medium vessel occlusion however. Posterior circulation: Dominant right vertebral  artery widely patent to the basilar. Small left vertebral artery largely terminates in PICA. Mild narrowing and irregularity of the basilar without flow limiting stenosis. Superior cerebellar and posterior cerebral arteries are patent. 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 Severe stenosis of the left P1 segment. Venous sinuses: Patent and normal. Anatomic variants: None significant. 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 No sign of acute infarction by head CT. CBF (<30%) Volume: 19mL Perfusion (Tmax>6.0s) volume: 64mL Mismatch Volume: 84mL Infarction Location:None IMPRESSION: No acute large vessel occlusion. Atherosclerotic disease at both carotid bifurcations. No significant stenosis. 30% stenosis at both vertebral artery origins. Severe atherosclerotic disease in both carotid siphon regions, worse on the left than the right. On the left, stenosis appears to be greater than 80%. On the right, stenosis is estimated at 50-70%. Atherosclerotic disease of both M1 segments. 50% stenosis on the right. Serial irregular 50-70% stenoses on the left. Atherosclerotic narrowing of both PCA branches as seen previously, with a severe stenosis of the P1 segment on the left. Electronically Signed   By: Nelson Chimes M.D.   On: 12/16/2019 15:57   CT HEAD CODE STROKE WO CONTRAST  Result Date: 12/16/2019 CLINICAL DATA:  Code stroke. Right sided arm weakness, gait disturbance and abnormal gaze. EXAM: CT HEAD WITHOUT CONTRAST TECHNIQUE: Contiguous axial images were obtained from the base of the skull through the vertex without intravenous contrast. COMPARISON:  MRI 12/18/2018 FINDINGS: Brain: No abnormality is seen affecting the brainstem or cerebellum. Cerebral hemispheres show atrophy with chronic small-vessel ischemic changes of the white matter. There is old infarction in the left  middle cerebral artery territory with encephalomalacia and gliosis. This is progressive compared to the study of last year, particularly in the frontal region and left basal ganglia/external capsule. No finding typical of acute insult. No mass, hemorrhage, hydrocephalus or  extra-axial collection. Vascular: There is atherosclerotic calcification of the major vessels at the base of the brain. Skull: Negative Sinuses/Orbits: Clear/normal Other: None ASPECTS ([place] Stroke Program Early CT Score) difficult to apply the setting of extensive old left MCA territory infarction. No sign of acute infarction suspected. IMPRESSION: 1. Extensive old ischemic changes in the left MCA territory. No sign of acute infarction. Compared to the study of last January, findings are progressive particularly in the left frontal region, basal ganglia and external capsule region. 2. No sign of hemorrhage. 3. These results were communicated to Dr. Rory Percy at 3:29 pmon 1/3/2021by text page via the [facility] messaging system. Electronically Signed   By: Nelson Chimes M.D.   On: 12/16/2019 15:30    EKG: Independently reviewed.  Sinus tachycardia with rate 115; RBBB, LAFB; nonspecific ST changes which may be rate related or due to ischemia   Labs on Admission: I have personally reviewed the available labs and imaging studies at the time of the admission.  Pertinent labs:   Glucose 131 WBC 11.1 INR 1.0   Assessment/Plan Principal Problem:   Altered mental status Active Problems:   HTN (hypertension)   Hyperglycemia   Seizure (HCC)   Polysubstance abuse (HCC)   History of stroke   AMS -Patient found unresponsive at home -With prior h/o CVA, aphasia, and recurrent brief seizures, there is concern for recurrent CVA -Will admit for fruther CVA evaluation -Seizure is also an expected diagnosis given his prior reported h/o seizures for which he is not taking AEDs -Telemetry monitoring -MRI -Echo -Risk stratification with FLP,  A1c; will also check TSH and UDS -Continue ASA and Plavix daily -Neurology consult -PT/OT/ST/Nutrition Consults  HTN -Allow permissive HTN for now -Treat BP only if >220/120, and then with goal of 15% reduction -Hold Lopressor and plan to restart in 48-72 hours   HLD -Check FLP -Cotinue Lipitor 80 mg daily   Hyperglycemia -A1c in 2019 and 2020 indicated no diabetes -Will recheck A1c -No SSI for now  Polysubstance abuse -Cessation should be encouraged on an ongoing basis -UDS ordered -Will order CIWA, as the patient's daughter is unsure how much alcohol he actually drinks  Heavy tobacco dependence -Encourage cessation.   -Patch ordered      Note: This patient has been tested and is pending for the novel coronavirus COVID-19.    DVT prophylaxis:  Lovenox  Code Status: Full - confirmed with family Family Communication: None present; I spoke with the patient's daughter at the time of admission Disposition Plan:  Home once clinically improved Consults called: Neurology; PT/OT/ST/TOC team/Nutrition  Admission status: Admit - It is my clinical opinion that admission to INPATIENT is reasonable and necessary because of the expectation that this patient will require hospital care that crosses at least 2 midnights to treat this condition based on the medical complexity of the problems presented.  Given the aforementioned information, the predictability of an adverse outcome is felt to be significant.     Karmen Bongo MD [facility]   How to contact the [facility] Attending or Consulting provider [place] or covering provider during after hours [place], for this patient?  1. Check the care team in [facility] and look for a) attending/consulting [facility] provider listed and b) the [facility] team listed 2. Log into [email or web address] and use [place]'s universal password to access. If you do not have the password, please contact the hospital operator. 3. Locate the [facility] provider you are looking for  under [facility]  and page to a number that you can be directly reached. 4. If you still have difficulty reaching the provider, please page the [facility] (Director on Call) for the Hospitalists listed on amion for assistance.   12/16/2019, 6:16 PM

## 2019-12-16 NOTE — ED Notes (Signed)
Patient developed bilateral eye twitching providers notified PA and MD at bedside lasting 60 seconds.

## 2019-12-16 NOTE — ED Provider Notes (Signed)
[facility] EMERGENCY DEPARTMENT Provider Note   CSN: [id number] Arrival date & time: 12/16/19  1513     History Chief Complaint  Patient presents with  . Code Stroke    Ronald Wolf is a 74 y.o. male presenting for evaluation for stroke.  Level 5 caveat due to patient being nonverbal.  History obtained from patient's daughter, EMS, and triage note.   Last seen normal 830 PM yesterday. H/o stroke in 12/2018. ?if f/u with neuro. Today family notices R sided weakness. Had 1 min episode of sz-like activity of the face. EMS was called. Pt had another episode of face twitching with EMS.  Pt has been nonverbal since arrival to the ED, not baseline per daughter.  Per daughter pt lives with his girlfriend. Pt is able to ambulate minimally without assistance. He needs assistance with taking care of himself (bathing and feeding).   Additional history obtained from chart review.  History of CVA (?on plavix), hep C, hypertension, noncompliance, anxiety, cancer of the glottis, alcohol use, trigeminal neuralgia    HPI     Past Medical History:  Diagnosis Date  . Abdominal pain, other specified site 09/26/2013  . Anxiety   . Arthritis   . Atherosclerosis of arteries 05/13/2013  . Cough 09/26/2013  . Depression   . Eating disorder   . GERD (gastroesophageal reflux disease)   . Hepatitis C   . Hypertension   . Lesion of liver 04/01/2013  . Loss of weight 06/21/2014  . Medial knee pain 09/26/2013  . Palpitations 06/21/2014  . Shortness of breath dyspnea    with exertion    Patient Active Problem List   Diagnosis Date Noted  . Neurocognitive deficits 12/26/2018  . CVA (cerebral vascular accident) ([place]) 12/17/2018  . Cancer of glottis ([place]) 12/25/2017  . Alcohol use, unspecified with unspecified alcohol-induced disorder ([place]) 12/25/2017  . Stroke ([place]) 12/20/2017  . Decreased visual acuity 06/18/2015  . History of bladder cancer 04/24/2015  . History of UTI  03/27/2015  . UTI (urinary tract infection) 11/21/2014  . Fatigue 10/23/2014  . Hyperglycemia 10/23/2014  . Wellness examination 10/23/2014  . PVD (peripheral vascular disease) ([place]) 07/28/2014  . Anxiety and depression 07/28/2014  . Palpitations 06/21/2014  . Cough 09/26/2013  . Atherosclerosis of arteries 05/13/2013  . Smoking 05/03/2013  . Lesion of liver 04/01/2013  . Rhinitis 10/05/2012  . Chronic scapular pain 10/05/2012  . Anemia 09/17/2012  . Bone lesion 09/17/2012  . HTN (hypertension) 06/12/2012  . Trigeminal neuralgia 05/28/2012    Past Surgical History:  Procedure Laterality Date  . IR ANGIO INTRA EXTRACRAN SEL COM CAROTID INNOMINATE BILAT MOD SED  09/09/2017  . IR ANGIO VERTEBRAL SEL VERTEBRAL BILAT MOD SED  09/09/2017  . IR RADIOLOGIST EVAL & MGMT  09/07/2017  . MICROLARYNGOSCOPY WITH CO2 LASER AND EXCISION OF VOCAL CORD LESION N/A 05/14/2016   Procedure: MICROLARYNGOSCOPY WITH CO2 LASER AND EXCISION OF VOCAL CORD LESION;  Surgeon: Melida Quitter, MD;  Location: [facility];  Service: ENT;  Laterality: N/A;  Suspended micro laryngoscopy with CO2 laser excision of vocal fold lesion  . TUMOR REMOVAL  1990   bladder       Family History  Problem Relation Age of Onset  . Diabetes Mother   . Arthritis Mother   . Healthy Sister        x 2  . Cancer Brother        stomach  . Dementia Father   . Prostate  cancer Neg Hx   . Colon cancer Neg Hx   . Breast cancer Neg Hx   . Heart disease Neg Hx   . Hypertension Neg Hx     Social History   Tobacco Use  . Smoking status: Current Every Day Smoker    Packs/day: 1.00    Years: 35.00    Pack years: 35.00    Types: Cigarettes  . Smokeless tobacco: Never Used  Substance Use Topics  . Alcohol use: Yes    Alcohol/week: 0.0 standard drinks    Comment: 11 pint daily    none in a month and a half  . Drug use: No    Types: Marijuana    Home Medications Prior to Admission medications   Medication Sig Start Date End Date  Taking? Authorizing Provider  aspirin EC 81 MG tablet Take 81 mg by mouth daily.    Yes [provider]  atorvastatin (LIPITOR) 80 MG tablet TAKE 1 TABLET(80 MG) BY MOUTH DAILY AT 6 PM Patient taking differently: Take 80 mg by mouth daily at 6 PM.  09/06/19  Yes Mosie Lukes, MD  clopidogrel (PLAVIX) 75 MG tablet TAKE 1 TABLET(75 MG) BY MOUTH DAILY Patient taking differently: Take 75 mg by mouth daily.  09/06/19  Yes Mosie Lukes, MD  metoprolol tartrate (LOPRESSOR) 25 MG tablet TAKE 1/2 TABLET(12.5 MG) BY MOUTH TWICE DAILY Patient taking differently: Take 12.5 mg by mouth 2 (two) times daily.  09/06/19  Yes Mosie Lukes, MD  NUTRITIONAL SUPPLEMENT LIQD Take 120 mLs by mouth daily. [employer]   Yes [provider]  tetrahydrozoline-zinc (VISINE-AC) 0.05-0.25 % ophthalmic solution Place 1 drop into both eyes daily as needed (dry eyes).    Yes [provider]  nicotine (NICODERM CQ) 14 mg/24hr patch Place 1 patch (14 mg total) onto the skin daily. START AFTER DONE WITH 21 MG PATCHES Patient not taking: Reported on 12/16/2019 02/27/19   Mosie Lukes, MD  nicotine (NICODERM CQ) 21 mg/24hr patch Place 1 patch (21 mg total) onto the skin daily. Patient not taking: Reported on 12/16/2019 02/27/19   Mosie Lukes, MD  nicotine (NICODERM CQ) 7 mg/24hr patch Place 1 patch (7 mg total) onto the skin daily. START WHEN DONE WITH 14 MG PATCHES Patient not taking: Reported on 12/16/2019 02/27/19   Mosie Lukes, MD    Allergies    Iohexol  Review of Systems   Review of Systems  Unable to perform ROS: Patient nonverbal  Neurological: Positive for seizures and weakness.  Hematological: Bruises/bleeds easily.    Physical Exam Updated Vital Signs BP (!) 205/106   Pulse (!) 128   Temp 98.5 F (36.9 C) (Oral)   Resp 19   Ht 5\' 9"  (1.753 m)   Wt 70.6 kg   SpO2 100%   BMI 22.99 kg/m   Physical Exam Vitals and nursing note reviewed.  Constitutional:      Comments: Thin  elderly male who is not speaking  HENT:     Head: Normocephalic and atraumatic.  Eyes:     Extraocular Movements: Extraocular movements intact.     Conjunctiva/sclera: Conjunctivae normal.     Pupils: Pupils are equal, round, and reactive to light.  Cardiovascular:     Rate and Rhythm: Regular rhythm. Tachycardia present.     Pulses: Normal pulses.     Comments: tachycardic around 120 Pulmonary:     Effort: Pulmonary effort is normal. No respiratory distress.  Breath sounds: Normal breath sounds. No wheezing.     Comments: Clear lung sounds Abdominal:     General: There is no distension.     Palpations: Abdomen is soft. There is no mass.     Tenderness: There is no abdominal tenderness. There is no guarding or rebound.     Comments: Urostomy without signs of infection  Musculoskeletal:        General: Normal range of motion.     Cervical back: Normal range of motion and neck supple.     Comments: Weakness of R upper and lower ext  Skin:    General: Skin is warm and dry.     Capillary Refill: Capillary refill takes less than 2 seconds.  Neurological:     GCS: GCS eye subscore is 4. GCS verbal subscore is 1. GCS motor subscore is 5.     Sensory: Sensory deficit present.     Motor: Weakness and pronator drift present.     ED Results / Procedures / Treatments   Labs (all labs ordered are listed, but only abnormal results are displayed) Labs Reviewed  CBC - Abnormal; Notable for the following components:      Result Value   WBC 11.1 (*)    RBC 3.92 (*)    MCV 110.7 (*)    MCH 35.2 (*)    All other components within normal limits  DIFFERENTIAL - Abnormal; Notable for the following components:   Monocytes Absolute 1.1 (*)    All other components within normal limits  COMPREHENSIVE METABOLIC PANEL - Abnormal; Notable for the following components:   Glucose, Bld 131 (*)    All other components within normal limits  I-STAT CHEM 8, ED - Abnormal; Notable for the following  components:   Potassium 3.4 (*)    Glucose, Bld 121 (*)    All other components within normal limits  CBG MONITORING, ED - Abnormal; Notable for the following components:   Glucose-Capillary 117 (*)    All other components within normal limits  SARS CORONAVIRUS 2 (TAT 6-24 HRS)  PROTIME-INR  APTT  AMMONIA    EKG None  Radiology CT Code Stroke CTA Head W/WO contrast  Result Date: 12/16/2019 EXAM: CT ANGIOGRAPHY HEAD AND NECK CT PERFUSION BRAIN TECHNIQUE: Multidetector CT imaging of the head and neck was performed using the standard protocol during bolus administration of intravenous contrast. Multiplanar CT image reconstructions and MIPs were obtained to evaluate the vascular anatomy. Carotid stenosis measurements (when applicable) are obtained utilizing NASCET criteria, using the distal internal carotid diameter as the denominator. Multiphase CT imaging of the brain was performed following IV bolus contrast injection. Subsequent parametric perfusion maps were calculated using RAPID software. CONTRAST:  156mL OMNIPAQUE IOHEXOL 350 MG/ML SOLN COMPARISON:  Head CT same day.  CT angiography 08/24/2017. FINDINGS: CTA NECK FINDINGS Aortic arch: There is aortic atherosclerosis. The scan does not include the brachiocephalic vessel origins other than the left subclavian origin which is widely patent. Right carotid system: Common carotid artery widely patent to the bifurcation. Atherosclerotic disease at the carotid bifurcation and ICA bulb but no stenosis of the bulb beyond that of more distal cervical ICA. Left carotid system: Common carotid artery widely patent to the bifurcation. Atherosclerotic plaque at the carotid bifurcation and ICA bulb. Minimal diameter of the ICA bulb is 4 mm. Compared to a more distal cervical ICA diameter of 4.2 mm, this does not represent any significant stenosis. Vertebral arteries: 30% stenosis at both vertebral artery origins.  Beyond the origins, both vessels are widely  patent through the cervical region to the foramen magnum. Skeleton: Ordinary cervical spondylosis and facet osteoarthritis. Other neck: No mass or lymphadenopathy. Upper chest: Scarring and emphysematous changes at the apices. No acute process. Review of the MIP images confirms the above findings CTA HEAD FINDINGS Anterior circulation: Internal carotid arteries are patent through the carotid siphon regions, but there is severe atherosclerotic narrowing and irregularity, particularly on the left where there appears to be greater than 80% stenosis. Stenosis on the right estimated at 50-70%. Supraclinoid internal carotid arteries are widely patent. Both anterior cerebral arteries are widely patent. There is atherosclerotic disease of both M1 segments, 50% stenosis on the right and serial irregular 50-70% stenoses on the left. I do not see any acute distal large or medium vessel occlusion however. Posterior circulation: Dominant right vertebral artery widely patent to the basilar. Small left vertebral artery largely terminates in PICA. Mild narrowing and irregularity of the basilar without flow limiting stenosis. Superior cerebellar and posterior cerebral arteries are patent. 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 Severe stenosis of the left P1 segment. Venous sinuses: Patent and normal. Anatomic variants: None significant. 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 No sign of acute infarction by head CT. CBF (<30%) Volume: 9mL Perfusion (Tmax>6.0s) volume: 85mL Mismatch Volume: 3mL Infarction Location:None IMPRESSION: No acute large vessel occlusion. Atherosclerotic disease at both carotid bifurcations. No significant stenosis. 30% stenosis at both vertebral artery origins. Severe atherosclerotic disease in both carotid siphon regions, worse on the left than the right. On  the left, stenosis appears to be greater than 80%. On the right, stenosis is estimated at 50-70%. Atherosclerotic disease of both M1 segments. 50% stenosis on the right. Serial irregular 50-70% stenoses on the left. Atherosclerotic narrowing of both PCA branches as seen previously, with a severe stenosis of the P1 segment on the left. Electronically Signed   By: Nelson Chimes M.D.   On: 12/16/2019 15:57   CT Code Stroke CTA Neck W/WO contrast  Result Date: 12/16/2019 EXAM: CT ANGIOGRAPHY HEAD AND NECK CT PERFUSION BRAIN TECHNIQUE: Multidetector CT imaging of the head and neck was performed using the standard protocol during bolus administration of intravenous contrast. Multiplanar CT image reconstructions and MIPs were obtained to evaluate the vascular anatomy. Carotid stenosis measurements (when applicable) are obtained utilizing NASCET criteria, using the distal internal carotid diameter as the denominator. Multiphase CT imaging of the brain was performed following IV bolus contrast injection. Subsequent parametric perfusion maps were calculated using RAPID software. CONTRAST:  140mL OMNIPAQUE IOHEXOL 350 MG/ML SOLN COMPARISON:  Head CT same day.  CT angiography 08/24/2017. FINDINGS: CTA NECK FINDINGS Aortic arch: There is aortic atherosclerosis. The scan does not include the brachiocephalic vessel origins other than the left subclavian origin which is widely patent. Right carotid system: Common carotid artery widely patent to the bifurcation. Atherosclerotic disease at the carotid bifurcation and ICA bulb but no stenosis of the bulb beyond that of more distal cervical ICA. Left carotid system: Common carotid artery widely patent to the bifurcation. Atherosclerotic plaque at the carotid bifurcation and ICA bulb. Minimal diameter of the ICA bulb is 4 mm. Compared to a more distal cervical ICA diameter of 4.2 mm, this does not represent any significant stenosis. Vertebral arteries: 30% stenosis at both vertebral  artery origins. Beyond the origins, both  vessels are widely patent through the cervical region to the foramen magnum. Skeleton: Ordinary cervical spondylosis and facet osteoarthritis. Other neck: No mass or lymphadenopathy. Upper chest: Scarring and emphysematous changes at the apices. No acute process. Review of the MIP images confirms the above findings CTA HEAD FINDINGS Anterior circulation: Internal carotid arteries are patent through the carotid siphon regions, but there is severe atherosclerotic narrowing and irregularity, particularly on the left where there appears to be greater than 80% stenosis. Stenosis on the right estimated at 50-70%. Supraclinoid internal carotid arteries are widely patent. Both anterior cerebral arteries are widely patent. There is atherosclerotic disease of both M1 segments, 50% stenosis on the right and serial irregular 50-70% stenoses on the left. I do not see any acute distal large or medium vessel occlusion however. Posterior circulation: Dominant right vertebral artery widely patent to the basilar. Small left vertebral artery largely terminates in PICA. Mild narrowing and irregularity of the basilar without flow limiting stenosis. Superior cerebellar and posterior cerebral arteries are patent. 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 Severe stenosis of the left P1 segment. Venous sinuses: Patent and normal. Anatomic variants: None significant. 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 No sign of acute infarction by head CT. CBF (<30%) Volume: 37mL Perfusion (Tmax>6.0s) volume: 57mL Mismatch Volume: 58mL Infarction Location:None IMPRESSION: No acute large vessel occlusion. Atherosclerotic disease at both carotid bifurcations. No significant stenosis. 30% stenosis at both vertebral artery origins. Severe atherosclerotic disease in both  carotid siphon regions, worse on the left than the right. On the left, stenosis appears to be greater than 80%. On the right, stenosis is estimated at 50-70%. Atherosclerotic disease of both M1 segments. 50% stenosis on the right. Serial irregular 50-70% stenoses on the left. Atherosclerotic narrowing of both PCA branches as seen previously, with a severe stenosis of the P1 segment on the left. Electronically Signed   By: Nelson Chimes M.D.   On: 12/16/2019 15:57   CT Code Stroke Cerebral Perfusion with contrast  Result Date: 12/16/2019 EXAM: CT ANGIOGRAPHY HEAD AND NECK CT PERFUSION BRAIN TECHNIQUE: Multidetector CT imaging of the head and neck was performed using the standard protocol during bolus administration of intravenous contrast. Multiplanar CT image reconstructions and MIPs were obtained to evaluate the vascular anatomy. Carotid stenosis measurements (when applicable) are obtained utilizing NASCET criteria, using the distal internal carotid diameter as the denominator. Multiphase CT imaging of the brain was performed following IV bolus contrast injection. Subsequent parametric perfusion maps were calculated using RAPID software. CONTRAST:  197mL OMNIPAQUE IOHEXOL 350 MG/ML SOLN COMPARISON:  Head CT same day.  CT angiography 08/24/2017. FINDINGS: CTA NECK FINDINGS Aortic arch: There is aortic atherosclerosis. The scan does not include the brachiocephalic vessel origins other than the left subclavian origin which is widely patent. Right carotid system: Common carotid artery widely patent to the bifurcation. Atherosclerotic disease at the carotid bifurcation and ICA bulb but no stenosis of the bulb beyond that of more distal cervical ICA. Left carotid system: Common carotid artery widely patent to the bifurcation. Atherosclerotic plaque at the carotid bifurcation and ICA bulb. Minimal diameter of the ICA bulb is 4 mm. Compared to a more distal cervical ICA diameter of 4.2 mm, this does not represent any  significant stenosis. Vertebral arteries: 30% stenosis at both vertebral artery origins. Beyond the origins, both vessels are widely patent through  the cervical region to the foramen magnum. Skeleton: Ordinary cervical spondylosis and facet osteoarthritis. Other neck: No mass or lymphadenopathy. Upper chest: Scarring and emphysematous changes at the apices. No acute process. Review of the MIP images confirms the above findings CTA HEAD FINDINGS Anterior circulation: Internal carotid arteries are patent through the carotid siphon regions, but there is severe atherosclerotic narrowing and irregularity, particularly on the left where there appears to be greater than 80% stenosis. Stenosis on the right estimated at 50-70%. Supraclinoid internal carotid arteries are widely patent. Both anterior cerebral arteries are widely patent. There is atherosclerotic disease of both M1 segments, 50% stenosis on the right and serial irregular 50-70% stenoses on the left. I do not see any acute distal large or medium vessel occlusion however. Posterior circulation: Dominant right vertebral artery widely patent to the basilar. Small left vertebral artery largely terminates in PICA. Mild narrowing and irregularity of the basilar without flow limiting stenosis. Superior cerebellar and posterior cerebral arteries are patent. 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 Severe stenosis of the left P1 segment. Venous sinuses: Patent and normal. Anatomic variants: None significant. 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 No sign of acute infarction by head CT. CBF (<30%) Volume: 55mL Perfusion (Tmax>6.0s) volume: 53mL Mismatch Volume: 48mL Infarction Location:None IMPRESSION: No acute large vessel occlusion. Atherosclerotic disease at both carotid bifurcations. No significant stenosis. 30% stenosis at  both vertebral artery origins. Severe atherosclerotic disease in both carotid siphon regions, worse on the left than the right. On the left, stenosis appears to be greater than 80%. On the right, stenosis is estimated at 50-70%. Atherosclerotic disease of both M1 segments. 50% stenosis on the right. Serial irregular 50-70% stenoses on the left. Atherosclerotic narrowing of both PCA branches as seen previously, with a severe stenosis of the P1 segment on the left. Electronically Signed   By: Nelson Chimes M.D.   On: 12/16/2019 15:57   CT HEAD CODE STROKE WO CONTRAST  Result Date: 12/16/2019 CLINICAL DATA:  Code stroke. Right sided arm weakness, gait disturbance and abnormal gaze. EXAM: CT HEAD WITHOUT CONTRAST TECHNIQUE: Contiguous axial images were obtained from the base of the skull through the vertex without intravenous contrast. COMPARISON:  MRI 12/18/2018 FINDINGS: Brain: No abnormality is seen affecting the brainstem or cerebellum. Cerebral hemispheres show atrophy with chronic small-vessel ischemic changes of the white matter. There is old infarction in the left middle cerebral artery territory with encephalomalacia and gliosis. This is progressive compared to the study of last year, particularly in the frontal region and left basal ganglia/external capsule. No finding typical of acute insult. No mass, hemorrhage, hydrocephalus or extra-axial collection. Vascular: There is atherosclerotic calcification of the major vessels at the base of the brain. Skull: Negative Sinuses/Orbits: Clear/normal Other: None ASPECTS ([place] Stroke Program Early CT Score) difficult to apply the setting of extensive old left MCA territory infarction. No sign of acute infarction suspected. IMPRESSION: 1. Extensive old ischemic changes in the left MCA territory. No sign of acute infarction. Compared to the study of last January, findings are progressive particularly in the left frontal region, basal ganglia and external capsule  region. 2. No sign of hemorrhage. 3. These results were communicated to Dr. Rory Percy at 3:29 pmon 1/3/2021by text page via the [facility] messaging system. Electronically Signed   By: Nelson Chimes M.D.   On: 12/16/2019 15:30  Procedures .Critical Care Performed by: Franchot Heidelberg, PA-C Authorized by: Franchot Heidelberg, PA-C   Critical care provider statement:    Critical care time (minutes):  60   Critical care time was exclusive of:  Separately billable procedures and treating other patients and teaching time   Critical care was necessary to treat or prevent imminent or life-threatening deterioration of the following conditions:  CNS failure or compromise   Critical care was time spent personally by me on the following activities:  Blood draw for specimens, development of treatment plan with patient or surrogate, evaluation of patient's response to treatment, examination of patient, obtaining history from patient or surrogate, ordering and performing treatments and interventions, ordering and review of laboratory studies, ordering and review of radiographic studies, pulse oximetry, re-evaluation of patient's condition and review of old charts   I assumed direction of critical care for this patient from another provider in my specialty: no   Comments:     Pt with signs of stroke and repetitive sz.  Will need admission and sz-medications.    (including critical care time)  Medications Ordered in ED Medications  sodium chloride flush (NS) 0.9 % injection 3 mL (3 mLs Intravenous Given 12/16/19 1605)  diphenhydrAMINE (BENADRYL) injection 25 mg (25 mg Intravenous Given 12/16/19 1530)  iohexol (OMNIPAQUE) 350 MG/ML injection 125 mL (125 mLs Intravenous Contrast Given 12/16/19 1546)  levETIRAcetam (KEPPRA) IVPB 1500 mg/ 100 mL premix (0 mg Intravenous Stopped 12/16/19 1640)  LORazepam (ATIVAN) injection 2 mg (2 mg Intravenous Given 12/16/19 1617)    ED Course  I have reviewed the triage vital signs and the  nursing notes.  Pertinent labs & imaging results that were available during my care of the patient were reviewed by me and considered in my medical decision making (see chart for details).    MDM Rules/Calculators/A&P                      Patient presenting with acute onset right-sided weakness.  Nonverbal, not baseline.  GCS of 10.  Last seen normal at 830 last night, thus not a candidate for TPA.  Additionally, has had multiple short seizure-like activity described by twitching of the face. ?status.  Was evaluated by neurology, will obtain CTA with perfusion.  Patient does have a contrast allergy of hives, as such 25 mg of Benadryl was given prior to contrast.  On reevaluation after contrast, patient without sign of hives or anaphylaxis.  No signs of respiratory distress.  Called in by RN as patient had another episode of facial twitching, last about a minute. 2 of ativan given.   Per neuro recommendation, pt will need 1500 mg keppra bolus and then 500 mg BID. Will need admission for CVA w/u. CTA/P negative. MRI ordered. Labs overall reassuring. Will admit to medicine.   Discussed with Dr. Lorin Mercy from [facility] service, pt to be admitted.   Final Clinical Impression(s) / ED Diagnoses Final diagnoses:  Cerebrovascular accident (CVA), unspecified mechanism ([place])  Seizure [facility])    Rx / DC Orders ED Discharge Orders    None       Franchot Heidelberg, PA-C 12/16/19 2007    Fredia Sorrow, MD 12/23/19 1001

## 2019-12-16 NOTE — ED Provider Notes (Signed)
Medical screening examination/treatment/procedure(s) were conducted as a shared visit with non-physician practitioner(s) and myself.  I personally evaluated the patient during the encounter.      ED ECG REPORT   Date: 12/16/2019  Rate: 115  Rhythm: sinus tachycardia  QRS Axis: normal  Intervals: normal  ST/T Wave abnormalities: nonspecific ST/T changes  Conduction Disutrbances:right bundle branch block and left anterior fascicular block  Narrative Interpretation:   Old EKG Reviewed: none available  I have personally reviewed the EKG tracing and agree with the computerized printout as noted.   Patient seen by me along with the physician assistant.  Patient arrived as a code stroke.  Patient with a history of prior stroke.  Used to be followed by neurology.  Brought in by EMS.  Patient apparently had twitching of the right side of his face.  Seizure-like activity.  Patient past medical history significant for the stroke as mentioned above with residual deficits with difficulty walking also has hepatitis C hypertension and possible cognitive deficit.  Last seen normal 8:30 PM.  Family went to check on him today and he was not acting himself.  He was nonverbal EMS was called.  Brought here for evaluation in route patient had a 45-second full facial twitching that resolved spontaneously without any medications.  Patient arrived as a code stroke.  CT head had chronic changes no acute changes CTA and perfusion without any significant abnormalities.  But comparison according to neuro hospitalist of the head CT compared to old there could be an extension of his old infarct area.  Patient brought back here patient continued to have 2 more episodes of seizures was given Ativan and got a Keppra load.  Patient now an MRI.  Plan is admission to medical service.  But if he has any further seizures will notify neuro hospitalist again.  Critical care time documented by physician assistant.   Fredia Sorrow, MD 12/16/19 [phone]

## 2019-12-16 NOTE — ED Notes (Signed)
Called pharmacy for medication to be sent STAT.

## 2019-12-16 NOTE — Consult Note (Addendum)
Neurology Consultation  Reason for Consult: Cpde stroke Referring Physician: Dr Rogene Houston   CC: Aphasia, RSW, seizure en route with facial twitching 45 sec  History is obtained from: Chart review  HPI: Ronald Wolf is a 74 y.o. male who has a past medical history of prior strokes with residual deficits of difficulty walking, hepatitis C, depression, anxiety, hypertension, possible cognitive deficits, presented to the emergency room with a last known normal at 8:30 PM last night when seen by family.  This morning when the family checked on him, he was not acting like himself.  He had difficulty walking to the door.  He was swelling to the right.  He also was nonverbal.  EMS was called.  They assessed him and found him to be LVO positive on the stroke assessment and brought him to [facility] for further evaluation.  At the house, he was systolic [id number].  CBG-no hypoglycemia On the way to the hospital, had a 45-second episode of full facial twitching that resolved spontaneously without administration of benzodiazepines.  I was able to speak with the daughter Ms. Alveta Heimlich on the phone, who said that he has been deteriorating in terms of his mental abilities over the past couple of years.  He has not done his bills for the past 2 years.  He lives with his girlfriend who has changed his care from his prior neurologist to somewhere else where she does not know.  He is not on any antiseizure medications although he had a seizure few months ago and was told that that is secondary to his stroke.  At baseline, he lives with girlfriend, walks independently but is unable to take care of his ADLs without constant help.  CT head: Chronic changes.  No acute changes CTA and perfusion: Allergic to dye-hives-->25 mg benadryl IV given pre- med to CTA  LKW: 8:30 PM on 12/15/2019 tpa given?: no, outside the window Premorbid modified Rankin scale (mRS): 3 ROS:  Unable to obtain due to altered mental status.    Past Medical History:  Diagnosis Date  . Abdominal pain, other specified site 09/26/2013  . Anxiety   . Arthritis   . Atherosclerosis of arteries 05/13/2013  . Cough 09/26/2013  . Depression   . Eating disorder   . GERD (gastroesophageal reflux disease)   . Hepatitis C   . Hypertension   . Lesion of liver 04/01/2013  . Loss of weight 06/21/2014  . Medial knee pain 09/26/2013  . Palpitations 06/21/2014  . Shortness of breath dyspnea    with exertion    Family History  Problem Relation Age of Onset  . Diabetes Mother   . Arthritis Mother   . Healthy Sister        x 2  . Cancer Brother        stomach  . Dementia Father   . Prostate cancer Neg Hx   . Colon cancer Neg Hx   . Breast cancer Neg Hx   . Heart disease Neg Hx   . Hypertension Neg Hx    Social History:   reports that he has been smoking cigarettes. He has a 35.00 pack-year smoking history. He has never used smokeless tobacco. He reports current alcohol use. He reports that he does not use drugs.  Medications  Current Facility-Administered Medications:  .  diphenhydrAMINE (BENADRYL) injection 25 mg, 25 mg, Intravenous, Once, Laurey Morale N, NP .  sodium chloride flush (NS) 0.9 % injection 3 mL, 3 mL, Intravenous, Once,  Fredia Sorrow, MD  Current Outpatient Medications:  .  aspirin EC 81 MG tablet, Take 81 mg by mouth daily. , Disp: , Rfl:  .  atorvastatin (LIPITOR) 80 MG tablet, TAKE 1 TABLET(80 MG) BY MOUTH DAILY AT 6 PM, Disp: 90 tablet, Rfl: 1 .  clopidogrel (PLAVIX) 75 MG tablet, TAKE 1 TABLET(75 MG) BY MOUTH DAILY, Disp: 90 tablet, Rfl: 1 .  metoprolol tartrate (LOPRESSOR) 25 MG tablet, TAKE 1/2 TABLET(12.5 MG) BY MOUTH TWICE DAILY, Disp: 90 tablet, Rfl: 1 .  nicotine (NICODERM CQ) 14 mg/24hr patch, Place 1 patch (14 mg total) onto the skin daily. START AFTER DONE WITH 21 MG PATCHES, Disp: 28 patch, Rfl: 1 .  nicotine (NICODERM CQ) 21 mg/24hr patch, Place 1 patch (21 mg total) onto the skin daily.,  Disp: 28 patch, Rfl: 1 .  nicotine (NICODERM CQ) 7 mg/24hr patch, Place 1 patch (7 mg total) onto the skin daily. START WHEN DONE WITH 14 MG PATCHES, Disp: 28 patch, Rfl: 1 .  NUTRITIONAL SUPPLEMENT LIQD, Take 120 mLs by mouth daily. MedPass, Disp: , Rfl:  .  tetrahydrozoline-zinc (VISINE-AC) 0.05-0.25 % ophthalmic solution, Place 1 drop into both eyes daily as needed (dry eyes). , Disp: , Rfl:    Exam: Current vital signs: There were no vitals taken for this visit. Vital signs in last 24 hours:   General: Awake, not responsive to stimuli. HEENT: Cephalic atraumatic, dry oral mucous membranes Lungs: Clear to auscultation Cardiovascular: S1-2 heard regular rate rhythm Abdomen: Soft nondistended nontender Neurological exam He is awake, appears alert, does not follow any commands. Sitting in blankly staring ahead.  Does not track the examiner Cranial nerves: Pupils are equal round react light, extraocular movements are difficult to assess but he has a slight leftward gaze preference, blinks to threat from left not from the right, right lower facial subtle weakness at rest. Motor exam: Right upper extremity is flaccid, right lower extremity is 3/5, left upper extremities 5/5, left lower extremity is 3/5. Sensory exam: Mild grimace to noxious stimulation from the right side.  Withdrawal and strong grimace to noxious stimulation on the left. Coordination cannot be tested due to mentation NIH stroke scale: 1a Level of Conscious.: 0 1b LOC Questions: 2 1c LOC Commands: 2 2 Best Gaze: 1 3 Visual: 0 4 Facial Palsy: 1 5a Motor Arm - left: 0 5b Motor Arm - Right: 3 6a Motor Leg - Left: 2 6b Motor Leg - Right: 2 7 Limb Ataxia: 0 8 Sensory: 0 9 Best Language: 3 10 Dysarthria: 2 11 Extinct. and Inatten.: 0 TOTAL: 18   Labs I have reviewed labs in epic and the results pertinent to this consultation are: CBC    Component Value Date/Time   WBC 11.1 (H) 12/16/2019 1515   RBC 3.92 (L)  12/16/2019 1515   HGB 14.6 12/16/2019 1535   HGB 14.5 03/08/2016 1355   HCT 43.0 12/16/2019 1535   HCT 42.1 03/08/2016 1355   PLT 286 12/16/2019 1515   PLT 274 03/08/2016 1355   MCV 110.7 (H) 12/16/2019 1515   MCV 106 (H) 03/08/2016 1355   MCH 35.2 (H) 12/16/2019 1515   MCHC 31.8 12/16/2019 1515   RDW 12.8 12/16/2019 1515   RDW 13.3 03/08/2016 1355   LYMPHSABS 4.0 12/16/2019 1515   LYMPHSABS 2.4 03/08/2016 1355   MONOABS 1.1 (H) 12/16/2019 1515   EOSABS 0.1 12/16/2019 1515   EOSABS 0.1 03/08/2016 1355   BASOSABS 0.1 12/16/2019 1515   BASOSABS 0.0 03/08/2016  1355   CMP     Component Value Date/Time   NA 145 12/16/2019 1535   NA 145 03/08/2016 1356   K 3.4 (L) 12/16/2019 1535   K 4.5 03/08/2016 1356   CL 110 12/16/2019 1535   CO2 24 12/18/2018 0322   CO2 28 03/08/2016 1356   GLUCOSE 121 (H) 12/16/2019 1535   GLUCOSE 98 03/08/2016 1356   BUN 18 12/16/2019 1535   BUN 14.2 03/08/2016 1356   CREATININE 1.10 12/16/2019 1535   CREATININE 0.9 03/08/2016 1356   CALCIUM 9.3 12/18/2018 0322   CALCIUM 9.8 03/08/2016 1356   PROT 7.4 12/18/2018 0322   PROT 8.2 03/08/2016 1356   ALBUMIN 3.4 (L) 12/18/2018 0322   ALBUMIN 3.6 03/08/2016 1356   AST 40 12/18/2018 0322   AST 42 (H) 03/08/2016 1356   ALT 43 12/18/2018 0322   ALT 43 03/08/2016 1356   ALKPHOS 77 12/18/2018 0322   ALKPHOS 79 03/08/2016 1356   BILITOT 1.2 12/18/2018 0322   BILITOT 0.74 03/08/2016 1356   GFRNONAA 52 (L) 12/24/2018 0531   GFRAA 60 (L) 12/24/2018 0531     Imaging I have reviewed the images obtained:  CT-scan of the brain-no acute changes.  Chronic white matter disease.  Extensive old ischemic changes in the left MCA territory.  Compared to study in January, there is progressive hypodensities in the left frontal region basal ganglia and external capsule.  No hemorrhage. CT angio-multifocal intracranial stenosis.  No emergent LVO.  Severe atherosclerotic disease in both carotid siphons worse on the left  than right.  Left is greater than 80%.  Right is 50 to 70%.  Also atherosclerotic M ones bilaterally 50% stenosis on the right and a regular serial 50- 70% on the left.  Atherosclerotic narrowing of the PCA branches along with severe stenosis of P1 segment on the left. CT perfusion with no perfusion deficit.  Assessment: 74 year old man past history of strokes with gait deficits, hepatitis C, depression, anxiety, hypertension, probable cognitive deficits and a history of a seizure few months ago not on any antiepileptics presented to the emergency room with difficulty talking and right-sided weakness along with a short seizure involving facial twitching that happened while he was in the ambulance. His examination is suggestive of a left hemispheric deficit but the CT angiogram and CT perfusion study did not show any evidence of a thrombus or perfusion deficit. Noncontrast head CT has showed progressed white matter disease in the left hemisphere compared to last year. Current presentation could be secondary to hypoperfusion and new strokes because of the left carotid stenosis-worse on the left to about 80%.  Along with left M1 stenosis which is also worse compared to the right 50 to 70%. Other possibilities include seizures. He will need admission for further work-up  Impression: Evaluate for stroke Evaluate for symptomatic carotid stenosis Multifocal intracranial atherosclerosis Evaluate for seizure Evaluate for toxic metabolic encephalopathy  Recommendations: Admit to hospitalist Telemetry Frequent neurochecks 2D echo Continue dual antiplatelet High intensity statin Loaded with Keppra 1500 mg IV now and continue 500 mg twice daily going forward since this is the second episode of the seizure and he has structural reasons disease. Seizure precautions PT OT speech therapy I do not think is a good candidate for intervention given his baseline modified Rankin score of 3-4, but I will defer to  the stroke team to discuss with endovascular after the MRI results become available. Check chest x-ray, UA, labs EEG in AM Stroke neurology will  continue to follow with you.  -- Amie Portland, MD [facility] Pager: [phone] If 7pm to 7am, please call on call as listed on AMION.   CRITICAL CARE ATTESTATION Performed by: Amie Portland, MD Total critical care time: 55 minutes Critical care time was exclusive of separately billable procedures and treating other patients and/or supervising APPs/Residents/Students Critical care was necessary to treat or prevent imminent or life-threatening deterioration due to code stroke for right-sided weakness, right-sided weakness, aphasia, strokelike symptoms, seizure This patient is critically ill and at significant risk for neurological worsening and/or death and care requires constant monitoring. Critical care was time spent personally by me on the following activities: development of treatment plan with patient and/or surrogate as well as nursing, discussions with consultants, evaluation of patient's response to treatment, examination of patient, obtaining history from patient or surrogate, ordering and performing treatments and interventions, ordering and review of laboratory studies, ordering and review of radiographic studies, pulse oximetry, re-evaluation of patient's condition, participation in multidisciplinary rounds and medical decision making of high complexity in the care of this patient.

## 2019-12-16 NOTE — ED Notes (Signed)
Provider made aware of pt condition. Unable to thoroughly do [facility] d/t patient not being a/o enough to do assessment.

## 2019-12-16 NOTE — ED Triage Notes (Signed)
Arrived via EMS CODE STROKE LVO positive. Patient family stated LKW 2030 last night. Family stated noticed facial twitching and not acting himself. EMS reported patient developed facical twitching, bilateral eye blinking lasting 45 seconds. Upon arrival patient non verbal airway intact.

## 2019-12-17 ENCOUNTER — Inpatient Hospital Stay (HOSPITAL_COMMUNITY): Payer: Medicare PPO

## 2019-12-17 DIAGNOSIS — A419 Sepsis, unspecified organism: Secondary | ICD-10-CM

## 2019-12-17 DIAGNOSIS — I6389 Other cerebral infarction: Secondary | ICD-10-CM

## 2019-12-17 LAB — ECHOCARDIOGRAM COMPLETE
Height: 69 in
Weight: 2491.2 oz

## 2019-12-17 LAB — LIPID PANEL
Cholesterol: 143 mg/dL (ref 0–200)
HDL: 48 mg/dL (ref >40)
LDL Cholesterol: 78 mg/dL (ref 0–99)
Total CHOL/HDL Ratio: 3 RATIO
Triglycerides: 85 mg/dL (ref <150)
VLDL: 17 mg/dL (ref 0–40)

## 2019-12-17 LAB — RAPID URINE DRUG SCREEN, HOSP PERFORMED
Amphetamines: NOT DETECTED
Barbiturates: NOT DETECTED
Benzodiazepines: NOT DETECTED
Cocaine: NOT DETECTED
Opiates: NOT DETECTED
Tetrahydrocannabinol: NOT DETECTED

## 2019-12-17 LAB — HEMOGLOBIN A1C
Hgb A1c MFr Bld: 4.5 % — ABNORMAL LOW (ref 4.8–5.6)
Mean Plasma Glucose: 82.45 mg/dL

## 2019-12-17 MED ORDER — LABETALOL HCL 5 MG/ML IV SOLN
10.0000 mg | INTRAVENOUS | Status: DC | PRN
  Administered 2019-12-17 – 2019-12-19 (×7): 10 mg via INTRAVENOUS
  Filled 2019-12-17 (×7): qty 4

## 2019-12-17 MED ORDER — METOPROLOL TARTRATE 5 MG/5ML IV SOLN
2.5000 mg | Freq: Four times a day (QID) | INTRAVENOUS | Status: DC
  Administered 2019-12-17 – 2019-12-18 (×4): 2.5 mg via INTRAVENOUS
  Filled 2019-12-17 (×4): qty 5

## 2019-12-17 MED ORDER — ASPIRIN 300 MG RE SUPP
300.0000 mg | Freq: Every day | RECTAL | Status: DC
  Administered 2019-12-17 – 2019-12-19 (×3): 300 mg via RECTAL
  Filled 2019-12-17 (×4): qty 1

## 2019-12-17 MED ORDER — SODIUM CHLORIDE 0.9 % IV SOLN
1.0000 g | INTRAVENOUS | Status: DC
  Administered 2019-12-17: 1 g via INTRAVENOUS
  Filled 2019-12-17 (×2): qty 10

## 2019-12-17 MED ORDER — SODIUM CHLORIDE 0.9 % IV SOLN: INTRAVENOUS | Status: DC

## 2019-12-17 NOTE — ED Notes (Signed)
Found pt

## 2019-12-17 NOTE — ED Notes (Signed)
Paged provider, who returned call.  Aware of vitals.  Will put order in for blood pressure control.  Pt is to be NPO and ordered a swallow evaluation.

## 2019-12-17 NOTE — Procedures (Signed)
Patient Name: CLIVE DISHAW  MRN: [id number]  Epilepsy Attending: [name]  Referring Physician/Provider: Dr. Karmen Bongo Date: 12/17/2019 Duration: 28.50 minutes  Patient history: 74 year old male with left MCA infarct presented with seizure-like episode.  EEG to evaluate for seizures.  Level of alertness: Awake  AEDs during EEG study: Keppra  Technical aspects: This EEG study was done with scalp electrodes positioned according to the 10-20 International system of electrode placement. Electrical activity was acquired at a sampling rate of 500Hz  and reviewed with a high frequency filter of 70Hz  and a low frequency filter of 1Hz . EEG data were recorded continuously and digitally stored.   Description: During awake state, no clear posterior dominant rhythm was seen.  EEG showed continuous generalized, maximal left frontotemporal 3 to 5 Hz slowing.  Sharp waves were also seen in left frontotemporal region.  Hyperventilation and photic stimulation were not performed.  Abnormality -Continuous slow, generalized, maximal left frontotemporal -Sharp waves, left frontotemporal  IMPRESSION: This study showed evidence of epileptogenicity and cortical dysfunction in left frontotemporal region likely secondary to underlying encephalomalacia.  Additionally there is evidence of mild to moderate diffuse encephalopathy, nonspecific etiology.  No seizures were seen throughout the recording.  Dominic Rhome [name]

## 2019-12-17 NOTE — ED Notes (Signed)
Family notified of bed assignment on [facility].

## 2019-12-17 NOTE — ED Notes (Signed)
Found pt w/ urine collection bag full, overflowing.  Bed sheets changed.  Pt continues to be non-responsive however continues to hold his hands at his throat.  As we were cleaning him and changing his sheets, we noticed it appeared that he had trouble swallowing.  He has a deep cough that sounds very fleam in the throat.  Pt sat up and was able to clear his throat.

## 2019-12-17 NOTE — ED Notes (Signed)
EEG at bedside.

## 2019-12-17 NOTE — ED Notes (Signed)
ED TO INPATIENT HANDOFF REPORT  ED Nurse Name and Phone #:   S Name/Age/Gender Ronald Wolf 74 y.o. male Room/Bed: 011C/011C  Code Status   Code Status: Full Code  Home/SNF/Other Nursing Home Patient oriented to: self Is this baseline? No   Triage Complete: Triage complete  Chief Complaint Seizure [facility]) [R56.9]  Triage Note Arrived via EMS CODE STROKE LVO positive. Patient family stated LKW 2030 last night. Family stated noticed facial twitching and not acting himself. EMS reported patient developed facical twitching, bilateral eye blinking lasting 45 seconds. Upon arrival patient non verbal airway intact.   Pt does not follow directions at this time.  Pt not taking PO'S    Allergies Allergies  Allergen Reactions  . Iohexol Hives     Desc: pt broke out in hives years ago from iv contrast. he was given 50mg  benadryl po, 1 hr prior to ct and did fine today w/o complications.  JB     Level of Care/Admitting Diagnosis ED Disposition    ED Disposition Condition Comment   Admit  Hospital Area: [place] [[id number]]  Level of Care: Progressive [102]  Admit to Progressive based on following criteria: NEUROLOGICAL AND NEUROSURGICAL complex patients with significant risk of instability, who do not meet ICU criteria, yet require close observation or frequent assessment (< / = every 2 - 4 hours) with medical / nursing intervention.  Covid Evaluation: Asymptomatic Screening Protocol (No Symptoms)  Diagnosis: Seizure ([place]) [id number]  Admitting Physician: Karmen Bongo [[id number]]  Attending Physician: Karmen Bongo [[id number]]  Estimated length of stay: past midnight tomorrow  Certification:: I certify this patient will need inpatient services for at least 2 midnights       B Medical/Surgery History Past Medical History:  Diagnosis Date  . Abdominal pain, other specified site 09/26/2013  . Anxiety   . Arthritis   . Atherosclerosis of arteries 05/13/2013  . Cough  09/26/2013  . Depression   . Eating disorder   . GERD (gastroesophageal reflux disease)   . Hepatitis C   . Hypertension   . Lesion of liver 04/01/2013  . Loss of weight 06/21/2014  . Medial knee pain 09/26/2013  . Palpitations 06/21/2014  . Shortness of breath dyspnea    with exertion   Past Surgical History:  Procedure Laterality Date  . IR ANGIO INTRA EXTRACRAN SEL COM CAROTID INNOMINATE BILAT MOD SED  09/09/2017  . IR ANGIO VERTEBRAL SEL VERTEBRAL BILAT MOD SED  09/09/2017  . IR RADIOLOGIST EVAL & MGMT  09/07/2017  . MICROLARYNGOSCOPY WITH CO2 LASER AND EXCISION OF VOCAL CORD LESION N/A 05/14/2016   Procedure: MICROLARYNGOSCOPY WITH CO2 LASER AND EXCISION OF VOCAL CORD LESION;  Surgeon: Melida Quitter, MD;  Location: [place];  Service: ENT;  Laterality: N/A;  Suspended micro laryngoscopy with CO2 laser excision of vocal fold lesion  . TUMOR REMOVAL  1990   bladder     A IV Location/Drains/Wounds Patient Lines/Drains/Airways Status   Active Line/Drains/Airways    Name:   Placement date:   Placement time:   Site:   Days:   Peripheral IV 12/16/19 Left Antecubital   12/16/19    1600    Antecubital   1   Peripheral IV 12/17/19 Right Hand   12/17/19    1119    Hand   less than 1   Urostomy Ureterostomy right RLQ   --    --    RLQ      Incision (Closed) 05/14/16 Lip  05/14/16    1029     1312          Intake/Output Last 24 hours  Intake/Output Summary (Last 24 hours) at 12/17/2019 1833 Last data filed at 12/17/2019 1600 Gross per 24 hour  Intake 395.73 ml  Output 175 ml  Net 220.73 ml    Labs/Imaging Results for orders placed or performed during the hospital encounter of 12/16/19 (from the past 48 hour(s))  Protime-INR     Status: None   Collection Time: 12/16/19  3:15 PM  Result Value Ref Range   Prothrombin Time 12.8 11.4 - 15.2 seconds   INR 1.0 0.8 - 1.2    Comment: (NOTE) INR goal varies based on device and disease states. Performed at [facility], [place] [address] [place], [place] [address]   APTT     Status: None   Collection Time: 12/16/19  3:15 PM  Result Value Ref Range   aPTT 24 24 - 36 seconds    Comment: Performed at [facility] [address] [place], [place] [address]  CBC     Status: Abnormal   Collection Time: 12/16/19  3:15 PM  Result Value Ref Range   WBC 11.1 (H) 4.0 - 10.5 K/uL   RBC 3.92 (L) 4.22 - 5.81 MIL/uL   Hemoglobin 13.8 13.0 - 17.0 g/dL   HCT 43.4 39.0 - 52.0 %   MCV 110.7 (H) 80.0 - 100.0 fL   MCH 35.2 (H) 26.0 - 34.0 pg   MCHC 31.8 30.0 - 36.0 g/dL   RDW 12.8 11.5 - 15.5 %   Platelets 286 150 - 400 K/uL   nRBC 0.0 0.0 - 0.2 %    Comment: Performed at [facility], [place] [address], [place], [place] [address]  Differential     Status: Abnormal   Collection Time: 12/16/19  3:15 PM  Result Value Ref Range   Neutrophils Relative % 52 %   Neutro Abs 5.9 1.7 - 7.7 K/uL   Lymphocytes Relative 36 %   Lymphs Abs 4.0 0.7 - 4.0 K/uL   Monocytes Relative 10 %   Monocytes Absolute 1.1 (H) 0.1 - 1.0 K/uL   Eosinophils Relative 1 %   Eosinophils Absolute 0.1 0.0 - 0.5 K/uL   Basophils Relative 0 %   Basophils Absolute 0.1 0.0 - 0.1 K/uL   Immature Granulocytes 1 %   Abs Immature Granulocytes 0.06 0.00 - 0.07 K/uL    Comment: Performed at [facility] [address] [place], [place] [address]  Comprehensive metabolic panel     Status: Abnormal   Collection Time: 12/16/19  3:15 PM  Result Value Ref Range   Sodium 145 135 - 145 mmol/L   Potassium 3.6 3.5 - 5.1 mmol/L   Chloride 111 98 - 111 mmol/L   CO2 24 22 - 32 mmol/L   Glucose, Bld 131 (H) 70 - 99 mg/dL   BUN 16 8 - 23 mg/dL   Creatinine, Ser 1.14 0.61 - 1.24 mg/dL   Calcium 9.5 8.9 - 10.3 mg/dL   Total Protein 7.5 6.5 - 8.1 g/dL   Albumin 3.7 3.5 - 5.0 g/dL   AST 36 15 - 41 U/L   ALT 40 0 - 44 U/L   Alkaline Phosphatase 100 38 - 126 U/L   Total Bilirubin 0.9 0.3 - 1.2 mg/dL   GFR calc non Af Amer >60 >60 mL/min   GFR calc Af Amer >60 >60  mL/min   Anion gap 10 5 - 15    Comment: Performed at [facility] [address] [place], [place] [address]  CBG monitoring, ED     Status: Abnormal   Collection Time: 12/16/19  3:19 PM  Result Value Ref Range   Glucose-Capillary 117 (H) 70 - 99 mg/dL  I-stat chem 8, ED     Status: Abnormal   Collection Time: 12/16/19  3:35 PM  Result Value Ref Range   Sodium 145 135 - 145 mmol/L   Potassium 3.4 (L) 3.5 - 5.1 mmol/L   Chloride 110 98 - 111 mmol/L   BUN 18 8 - 23 mg/dL   Creatinine, Ser 1.10 0.61 - 1.24 mg/dL   Glucose, Bld 121 (H) 70 - 99 mg/dL   Calcium, Ion 1.30 1.15 - 1.40 mmol/L   TCO2 26 22 - 32 mmol/L   Hemoglobin 14.6 13.0 - 17.0 g/dL   HCT 43.0 39.0 - 52.0 %  SARS CORONAVIRUS 2 (TAT 6-24 HRS) Nasopharyngeal Nasopharyngeal Swab     Status: None   Collection Time: 12/16/19  3:55 PM   Specimen: Nasopharyngeal Swab  Result Value Ref Range   SARS Coronavirus 2 NEGATIVE NEGATIVE    Comment: (NOTE) SARS-CoV-2 target nucleic acids are NOT DETECTED. The SARS-CoV-2 RNA is generally detectable in upper and lower respiratory specimens during the acute phase of infection. Negative results do not preclude SARS-CoV-2 infection, do not rule out co-infections with other pathogens, and should not be used as the sole basis for treatment or other patient management decisions. Negative results must be combined with clinical observations, patient history, and epidemiological information. The expected result is Negative. Fact Sheet for Patients: SugarRoll.be Fact Sheet for Healthcare Providers: https://www.woods-mathews.com/ This test is not yet approved or cleared by the [place] FDA and  has been authorized for detection and/or diagnosis of SARS-CoV-2 by FDA under an Emergency Use Authorization (EUA). This EUA will remain  in effect (meaning this test can be used) for the duration of the COVID-19 declaration under Section 56 4(b)(1)  of the Act, 21 U.S.C. section 360bbb-3(b)(1), unless the authorization is terminated or revoked sooner. Performed at [facility], [place] [address], [place], [place] [address]   Ammonia     Status: Abnormal   Collection Time: 12/16/19  4:56 PM  Result Value Ref Range   Ammonia 52 (H) 9 - 35 umol/L    Comment: Performed at [facility], [place] [address] [place], [place] [address]  TSH     Status: None   Collection Time: 12/16/19  8:13 PM  Result Value Ref Range   TSH 1.400 0.350 - 4.500 uIU/mL    Comment: Performed by a 3rd Generation assay with a functional sensitivity of <=0.01 uIU/mL. Performed at [facility], [place] [address], [place], [place] [address]   Urine rapid drug screen (hosp performed)not at [facility]     Status: None   Collection Time: 12/17/19  7:25 AM  Result Value Ref Range   Opiates NONE DETECTED NONE DETECTED   Cocaine NONE DETECTED NONE DETECTED   Benzodiazepines NONE DETECTED NONE DETECTED   Amphetamines NONE DETECTED NONE DETECTED   Tetrahydrocannabinol NONE DETECTED NONE DETECTED   Barbiturates NONE DETECTED NONE DETECTED    Comment: (NOTE) DRUG SCREEN FOR MEDICAL PURPOSES ONLY.  IF CONFIRMATION IS NEEDED FOR ANY PURPOSE, NOTIFY LAB WITHIN 5 DAYS. LOWEST DETECTABLE LIMITS FOR URINE DRUG SCREEN Drug Class  Cutoff (ng/mL) Amphetamine and metabolites    1000 Barbiturate and metabolites    200 Benzodiazepine                 [id number] Tricyclics and metabolites     300 Opiates and metabolites        300 Cocaine and metabolites        300 THC                            50 Performed at [facility], [place] [address], [place], [place] [address]   Hemoglobin A1c     Status: Abnormal   Collection Time: 12/17/19  8:19 AM  Result Value Ref Range   Hgb A1c MFr Bld 4.5 (L) 4.8 - 5.6 %    Comment: (NOTE) Pre diabetes:          5.7%-6.4% Diabetes:              >6.4% Glycemic control for   <7.0% adults with diabetes    Mean Plasma  Glucose 82.45 mg/dL    Comment: Performed at [facility] [address], [place], [place] [address]  Lipid panel     Status: None   Collection Time: 12/17/19  8:19 AM  Result Value Ref Range   Cholesterol 143 0 - 200 mg/dL   Triglycerides 85 <150 mg/dL   HDL 48 >40 mg/dL   Total CHOL/HDL Ratio 3.0 RATIO   VLDL 17 0 - 40 mg/dL   LDL Cholesterol 78 0 - 99 mg/dL    Comment:        Total Cholesterol/HDL:CHD Risk Coronary Heart Disease Risk Table                     Men   Women  1/2 Average Risk   3.4   3.3  Average Risk       5.0   4.4  2 X Average Risk   9.6   7.1  3 X Average Risk  23.4   11.0        Use the calculated Patient Ratio above and the CHD Risk Table to determine the patient's CHD Risk.        ATP III CLASSIFICATION (LDL):  <100     mg/dL   Optimal  100-129  mg/dL   Near or Above                    Optimal  130-159  mg/dL   Borderline  160-189  mg/dL   High  >190     mg/dL   Very High Performed at [facility] [address], [place],  [address]    CT Code Stroke CTA Head W/WO contrast  Result Date: 12/16/2019 EXAM: CT ANGIOGRAPHY HEAD AND NECK CT PERFUSION BRAIN TECHNIQUE: Multidetector CT imaging of the head and neck was performed using the standard protocol during bolus administration of intravenous contrast. Multiplanar CT image reconstructions and MIPs were obtained to evaluate the vascular anatomy. Carotid stenosis measurements (when applicable) are obtained utilizing NASCET criteria, using the distal internal carotid diameter as the denominator. Multiphase CT imaging of the brain was performed following IV bolus contrast injection. Subsequent parametric perfusion maps were calculated using RAPID software. CONTRAST:  167mL OMNIPAQUE IOHEXOL 350 MG/ML SOLN COMPARISON:  Head CT same day.  CT angiography 08/24/2017. FINDINGS: CTA NECK FINDINGS Aortic arch: There is aortic atherosclerosis. The  scan does not include the brachiocephalic vessel origins  other than the left subclavian origin which is widely patent. Right carotid system: Common carotid artery widely patent to the bifurcation. Atherosclerotic disease at the carotid bifurcation and ICA bulb but no stenosis of the bulb beyond that of more distal cervical ICA. Left carotid system: Common carotid artery widely patent to the bifurcation. Atherosclerotic plaque at the carotid bifurcation and ICA bulb. Minimal diameter of the ICA bulb is 4 mm. Compared to a more distal cervical ICA diameter of 4.2 mm, this does not represent any significant stenosis. Vertebral arteries: 30% stenosis at both vertebral artery origins. Beyond the origins, both vessels are widely patent through the cervical region to the foramen magnum. Skeleton: Ordinary cervical spondylosis and facet osteoarthritis. Other neck: No mass or lymphadenopathy. Upper chest: Scarring and emphysematous changes at the apices. No acute process. Review of the MIP images confirms the above findings CTA HEAD FINDINGS Anterior circulation: Internal carotid arteries are patent through the carotid siphon regions, but there is severe atherosclerotic narrowing and irregularity, particularly on the left where there appears to be greater than 80% stenosis. Stenosis on the right estimated at 50-70%. Supraclinoid internal carotid arteries are widely patent. Both anterior cerebral arteries are widely patent. There is atherosclerotic disease of both M1 segments, 50% stenosis on the right and serial irregular 50-70% stenoses on the left. I do not see any acute distal large or medium vessel occlusion however. Posterior circulation: Dominant right vertebral artery widely patent to the basilar. Small left vertebral artery largely terminates in PICA. Mild narrowing and irregularity of the basilar without flow limiting stenosis. Superior cerebellar and posterior cerebral arteries are patent. 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 Severe  stenosis of the left P1 segment. Venous sinuses: Patent and normal. Anatomic variants: None significant. 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 No sign of acute infarction by head CT. CBF (<30%) Volume: 72mL Perfusion (Tmax>6.0s) volume: 97mL Mismatch Volume: 73mL Infarction Location:None IMPRESSION: No acute large vessel occlusion. Atherosclerotic disease at both carotid bifurcations. No significant stenosis. 30% stenosis at both vertebral artery origins. Severe atherosclerotic disease in both carotid siphon regions, worse on the left than the right. On the left, stenosis appears to be greater than 80%. On the right, stenosis is estimated at 50-70%. Atherosclerotic disease of both M1 segments. 50% stenosis on the right. Serial irregular 50-70% stenoses on the left. Atherosclerotic narrowing of both PCA branches as seen previously, with a severe stenosis of the P1 segment on the left. Electronically Signed   By: Nelson Chimes M.D.   On: 12/16/2019 15:57   CT Code Stroke CTA Neck W/WO contrast  Result Date: 12/16/2019 EXAM: CT ANGIOGRAPHY HEAD AND NECK CT PERFUSION BRAIN TECHNIQUE: Multidetector CT imaging of the head and neck was performed using the standard protocol during bolus administration of intravenous contrast. Multiplanar CT image reconstructions and MIPs were obtained to evaluate the vascular anatomy. Carotid stenosis measurements (when applicable) are obtained utilizing NASCET criteria, using the distal internal carotid diameter as the denominator. Multiphase CT imaging of the brain was performed following IV bolus contrast injection. Subsequent parametric perfusion maps were calculated using RAPID software. CONTRAST:  134mL OMNIPAQUE IOHEXOL 350 MG/ML SOLN COMPARISON:  Head CT same day.  CT angiography 08/24/2017. FINDINGS: CTA NECK FINDINGS Aortic arch: There is aortic atherosclerosis. The  scan does not include  the brachiocephalic vessel origins other than the left subclavian origin which is widely patent. Right carotid system: Common carotid artery widely patent to the bifurcation. Atherosclerotic disease at the carotid bifurcation and ICA bulb but no stenosis of the bulb beyond that of more distal cervical ICA. Left carotid system: Common carotid artery widely patent to the bifurcation. Atherosclerotic plaque at the carotid bifurcation and ICA bulb. Minimal diameter of the ICA bulb is 4 mm. Compared to a more distal cervical ICA diameter of 4.2 mm, this does not represent any significant stenosis. Vertebral arteries: 30% stenosis at both vertebral artery origins. Beyond the origins, both vessels are widely patent through the cervical region to the foramen magnum. Skeleton: Ordinary cervical spondylosis and facet osteoarthritis. Other neck: No mass or lymphadenopathy. Upper chest: Scarring and emphysematous changes at the apices. No acute process. Review of the MIP images confirms the above findings CTA HEAD FINDINGS Anterior circulation: Internal carotid arteries are patent through the carotid siphon regions, but there is severe atherosclerotic narrowing and irregularity, particularly on the left where there appears to be greater than 80% stenosis. Stenosis on the right estimated at 50-70%. Supraclinoid internal carotid arteries are widely patent. Both anterior cerebral arteries are widely patent. There is atherosclerotic disease of both M1 segments, 50% stenosis on the right and serial irregular 50-70% stenoses on the left. I do not see any acute distal large or medium vessel occlusion however. Posterior circulation: Dominant right vertebral artery widely patent to the basilar. Small left vertebral artery largely terminates in PICA. Mild narrowing and irregularity of the basilar without flow limiting stenosis. Superior cerebellar and posterior cerebral arteries are patent. Sternotomy disease in  both PCA branches, worse on the left than the right. Severe stenosis of the left P1 segment. Venous sinuses: Patent and normal. Anatomic variants: None significant. 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 No sign of acute infarction by head CT. CBF (<30%) Volume: 50mL Perfusion (Tmax>6.0s) volume: 28mL Mismatch Volume: 40mL Infarction Location:None IMPRESSION: No acute large vessel occlusion. Atherosclerotic disease at both carotid bifurcations. No significant stenosis. 30% stenosis at both vertebral artery origins. Severe atherosclerotic disease in both carotid siphon regions, worse on the left than the right. On the left, stenosis appears to be greater than 80%. On the right, stenosis is estimated at 50-70%. Atherosclerotic disease of both M1 segments. 50% stenosis on the right. Serial irregular 50-70% stenoses on the left. Atherosclerotic narrowing of both PCA branches as seen previously, with a severe stenosis of the P1 segment on the left. Electronically Signed   By: Nelson Chimes M.D.   On: 12/16/2019 15:57   MR Brain Wo Contrast (neuro protocol)  Result Date: 12/16/2019 CLINICAL DATA:  Right arm weakness and gait disturbance. Abnormal gaze. Old infarctions left MCA territory. EXAM: MRI HEAD WITHOUT CONTRAST TECHNIQUE: Multiplanar, multiecho pulse sequences of the brain and surrounding structures were obtained without intravenous contrast. COMPARISON:  CT studies earlier same day.  MRI 04/28/2019 FINDINGS: Brain: The study suffers from some motion degradation. No sign of acute infarction based on diffusion imaging. Punctate focus of restricted diffusion in the left posterior frontal region and left parietal region in the areas of old infarction not likely related to acute insult. No brainstem or cerebellar infarction. Right cerebral hemisphere shows mild chronic small-vessel change of the deep  white matter. On the left, there is an extensive old left MCA territory infarction affecting  the left basal ganglia, external capsule and frontoparietal brain. Hemosiderin deposition in the regions of old left MCA infarction. Volume loss with ex vacuo enlargement of the left lateral ventricle. Mass lesion or extra-axial collection Vascular: Major vessels at the base of the brain show flow. 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 No sign of acute or subacute infarction. 2 small foci of restricted diffusion within the regions of old infarction in the left posterior frontal and parietal region not felt to relate to acute insult. Hemosiderin deposition in the regions of the old left MCA infarction. Electronically Signed   By: Nelson Chimes M.D.   On: 12/16/2019 17:53   CT Code Stroke Cerebral Perfusion with contrast  Result Date: 12/16/2019 EXAM: CT ANGIOGRAPHY HEAD AND NECK CT PERFUSION BRAIN TECHNIQUE: Multidetector CT imaging of the head and neck was performed using the standard protocol during bolus administration of intravenous contrast. Multiplanar CT image reconstructions and MIPs were obtained to evaluate the vascular anatomy. Carotid stenosis measurements (when applicable) are obtained utilizing NASCET criteria, using the distal internal carotid diameter as the denominator. Multiphase CT imaging of the brain was performed following IV bolus contrast injection. Subsequent parametric perfusion maps were calculated using RAPID software. CONTRAST:  163mL OMNIPAQUE IOHEXOL 350 MG/ML SOLN COMPARISON:  Head CT same day.  CT angiography 08/24/2017. FINDINGS: CTA NECK FINDINGS Aortic arch: There is aortic atherosclerosis. The scan does not include the brachiocephalic vessel origins other than the left subclavian origin which is widely patent. Right carotid system: Common carotid artery widely patent to the  bifurcation. Atherosclerotic disease at the carotid bifurcation and ICA bulb but no stenosis of the bulb beyond that of more distal cervical ICA. Left carotid system: Common carotid artery widely patent to the bifurcation. Atherosclerotic plaque at the carotid bifurcation and ICA bulb. Minimal diameter of the ICA bulb is 4 mm. Compared to a more distal cervical ICA diameter of 4.2 mm, this does not represent any significant stenosis. Vertebral arteries: 30% stenosis at both vertebral artery origins. Beyond the origins, both vessels are widely patent through the cervical region to the foramen magnum. Skeleton: Ordinary cervical spondylosis and facet osteoarthritis. Other neck: No mass or lymphadenopathy. Upper chest: Scarring and emphysematous changes at the apices. No acute process. Review of the MIP images confirms the above findings CTA HEAD FINDINGS Anterior circulation: Internal carotid arteries are patent through the carotid siphon regions, but there is severe atherosclerotic narrowing and irregularity, particularly on the left where there appears to be greater than 80% stenosis. Stenosis on the right estimated at 50-70%. Supraclinoid internal carotid arteries are widely patent. Both anterior cerebral arteries are widely patent. There is atherosclerotic disease of both M1 segments, 50% stenosis on the right and serial irregular 50-70% stenoses on the left. I do not see any acute distal large or medium vessel occlusion however. Posterior circulation: Dominant right vertebral artery widely patent to the basilar. Small left vertebral artery largely terminates in PICA. Mild narrowing and irregularity of the basilar without flow limiting stenosis. Superior cerebellar and posterior cerebral arteries are patent. 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 Severe stenosis of the left P1 segment. Venous sinuses: Patent and normal. Anatomic variants: None significant. 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 No sign of acute infarction by head CT. CBF (<  30%) Volume: 19mL Perfusion (Tmax>6.0s) volume: 38mL Mismatch Volume: 53mL Infarction Location:None IMPRESSION: No acute large vessel occlusion. Atherosclerotic disease at both carotid bifurcations. No significant stenosis. 30% stenosis at both vertebral artery origins. Severe atherosclerotic disease in both carotid siphon regions, worse on the left than the right. On the left, stenosis appears to be greater than 80%. On the right, stenosis is estimated at 50-70%. Atherosclerotic disease of both M1 segments. 50% stenosis on the right. Serial irregular 50-70% stenoses on the left. Atherosclerotic narrowing of both PCA branches as seen previously, with a severe stenosis of the P1 segment on the left. Electronically Signed   By: Nelson Chimes M.D.   On: 12/16/2019 15:57   DG Chest Port 1 View  Result Date: 12/17/2019 CLINICAL DATA:  Altered mental status. EXAM: PORTABLE CHEST 1 VIEW COMPARISON:  Chest x-ray 12/18/2018 and chest CT dated 11/05/2014 FINDINGS: The heart size and mediastinal contours are within normal limits. Aortic atherosclerosis. Both lungs are clear. The visualized skeletal structures are unremarkable. IMPRESSION: 1. No active cardiopulmonary disease. 2.  Aortic Atherosclerosis (ICD10-I70.0). Electronically Signed   By: Lorriane Shire M.D.   On: 12/17/2019 14:06   EEG adult  Result Date: 12/17/2019 [name], MD     12/17/2019 11:55 AM Patient Name: Ronald Wolf MRN: [id number] Epilepsy Attending: [name] Referring Physician/Provider: Dr. Karmen Bongo Date: 12/17/2019 Duration: 28.50 minutes Patient history: 74 year old male with left MCA infarct presented with seizure-like episode.  EEG to evaluate for seizures. Level of alertness: Awake AEDs during EEG study: Keppra Technical aspects: This EEG study was  done with scalp electrodes positioned according to the 10-20 International system of electrode placement. Electrical activity was acquired at a sampling rate of 500Hz  and reviewed with a high frequency filter of 70Hz  and a low frequency filter of 1Hz . EEG data were recorded continuously and digitally stored. Description: During awake state, no clear posterior dominant rhythm was seen.  EEG showed continuous generalized, maximal left frontotemporal 3 to 5 Hz slowing.  Sharp waves were also seen in left frontotemporal region.  Hyperventilation and photic stimulation were not performed. Abnormality -Continuous slow, generalized, maximal left frontotemporal -Sharp waves, left frontotemporal IMPRESSION: This study showed evidence of epileptogenicity and cortical dysfunction in left frontotemporal region likely secondary to underlying encephalomalacia.  Additionally there is evidence of mild to moderate diffuse encephalopathy, nonspecific etiology.  No seizures were seen throughout the recording. [name]   ECHOCARDIOGRAM COMPLETE  Result Date: 12/17/2019   ECHOCARDIOGRAM REPORT   Patient Name:   Ronald Wolf Date of Exam: 12/17/2019 Medical Rec #:  [id number]     Height:       69.0 in Accession #:    [id number]    Weight:       155.7 lb Date of Birth:  1946/11/02      BSA:          1.86 m Patient Age:    55 years      BP:           187/94 mmHg Patient Gender: M             HR:           118 bpm. Exam Location:  Inpatient Procedure: 2D Echo Indications:    Stroke 434.91/I163.9  History:        Patient has prior history of Echocardiogram examinations, most  recent 12/18/2018. Risk Factors:Hypertension. H/o CVA.  Sonographer:    Clayton Lefort RDCS (AE) Referring Phys: [id number] JENNIFER YATES  Sonographer Comments: Image acquisition challenging due to uncooperative patient and Image acquisition challenging due to patient behavioral factors. Patient increasingly uncooperative as echocardiogram progressed.  Patient continued to use left arm to push against my left arm as I scanned, making image acquisition challenging. Patient did not respond to requests to keep arm at left side and image acquisition became impossible. IMPRESSIONS  1. Left ventricular ejection fraction, by visual estimation, is 60 to 65%. The left ventricle has normal function. There is moderately increased left ventricular hypertrophy.  2. Left ventricular diastolic parameters are consistent with Grade I diastolic dysfunction (impaired relaxation).  3. The left ventricle has no regional wall motion abnormalities.  4. Global right ventricle has normal systolic function.The right ventricular size is normal. No increase in right ventricular wall thickness.  5. Left atrial size was normal.  6. Right atrial size was normal.  7. The mitral valve is normal in structure. No evidence of mitral valve regurgitation.  8. The tricuspid valve is normal in structure.  9. The aortic valve is normal in structure. Aortic valve regurgitation is not visualized. 10. The pulmonic valve was normal in structure. Pulmonic valve regurgitation is not visualized. 11. Patient would not cooperate with completion of echo. 12. The atrial septum is grossly normal. FINDINGS  Left Ventricle: Left ventricular ejection fraction, by visual estimation, is 60 to 65%. The left ventricle has normal function. The left ventricle has no regional wall motion abnormalities. There is moderately increased left ventricular hypertrophy. Left ventricular diastolic parameters are consistent with Grade I diastolic dysfunction (impaired relaxation). Right Ventricle: The right ventricular size is normal. No increase in right ventricular wall thickness. Global RV systolic function is has normal systolic function. Left Atrium: Left atrial size was normal in size. Right Atrium: Right atrial size was normal in size Pericardium: There is no evidence of pericardial effusion. Mitral Valve: The mitral valve is  normal in structure. No evidence of mitral valve regurgitation. MV peak gradient, 10.1 mmHg. Tricuspid Valve: The tricuspid valve is normal in structure. Tricuspid valve regurgitation is not demonstrated. Aortic Valve: The aortic valve is normal in structure. Aortic valve regurgitation is not visualized. Aortic valve mean gradient measures 4.0 mmHg. Aortic valve peak gradient measures 6.1 mmHg. Aortic valve area, by VTI measures 3.15 cm. Pulmonic Valve: The pulmonic valve was normal in structure. Pulmonic valve regurgitation is not visualized. Pulmonic regurgitation is not visualized. Aorta: The aortic root and ascending aorta are structurally normal, with no evidence of dilitation. IAS/Shunts: The atrial septum is grossly normal. Additional Comments: Patient would not cooperate with completion of echo.  LEFT VENTRICLE PLAX 2D LVIDd:         3.80 cm  Diastology LVIDs:         2.70 cm  LV e' lateral:   14.60 cm/s LV PW:         1.30 cm  LV E/e' lateral: 3.5 LV IVS:        1.60 cm  LV e' medial:    4.57 cm/s LVOT diam:     2.20 cm  LV E/e' medial:  11.1 LV SV:         35 ml LV SV Index:   18.81 LVOT Area:     3.80 cm  RIGHT VENTRICLE TAPSE (M-mode): 1.9 cm LEFT ATRIUM           Index LA diam:  4.30 cm 2.32 cm/m LA Vol (A4C): 52.8 ml 28.43 ml/m  AORTIC VALVE AV Area (Vmax):    3.46 cm AV Area (Vmean):   3.02 cm AV Area (VTI):     3.15 cm AV Vmax:           123.00 cm/s AV Vmean:          94.000 cm/s AV VTI:            0.210 m AV Peak Grad:      6.1 mmHg AV Mean Grad:      4.0 mmHg LVOT Vmax:         112.00 cm/s LVOT Vmean:        74.800 cm/s LVOT VTI:          0.174 m LVOT/AV VTI ratio: 0.83  AORTA Ao Root diam: 2.90 cm MITRAL VALVE MV Area (PHT): 5.88 cm              SHUNTS MV Peak grad:  10.1 mmHg             Systemic VTI:  0.17 m MV Mean grad:  3.0 mmHg              Systemic Diam: 2.20 cm MV Vmax:       1.59 m/s MV Vmean:      78.0 cm/s MV VTI:        0.26 m MV PHT:        37.41 msec MV Decel Time: 129  msec MV E velocity: 50.70 cm/s  103 cm/s MV A velocity: 150.00 cm/s 70.3 cm/s MV E/A ratio:  0.34        1.5  Mertie Moores MD Electronically signed by Mertie Moores MD Signature Date/Time: 12/17/2019/2:23:44 PM    Final    CT HEAD CODE STROKE WO CONTRAST  Result Date: 12/16/2019 CLINICAL DATA:  Code stroke. Right sided arm weakness, gait disturbance and abnormal gaze. EXAM: CT HEAD WITHOUT CONTRAST TECHNIQUE: Contiguous axial images were obtained from the base of the skull through the vertex without intravenous contrast. COMPARISON:  MRI 12/18/2018 FINDINGS: Brain: No abnormality is seen affecting the brainstem or cerebellum. Cerebral hemispheres show atrophy with chronic small-vessel ischemic changes of the white matter. There is old infarction in the left middle cerebral artery territory with encephalomalacia and gliosis. This is progressive compared to the study of last year, particularly in the frontal region and left basal ganglia/external capsule. No finding typical of acute insult. No mass, hemorrhage, hydrocephalus or extra-axial collection. Vascular: There is atherosclerotic calcification of the major vessels at the base of the brain. Skull: Negative Sinuses/Orbits: Clear/normal Other: None ASPECTS ([place] Stroke Program Early CT Score) difficult to apply the setting of extensive old left MCA territory infarction. No sign of acute infarction suspected. IMPRESSION: 1. Extensive old ischemic changes in the left MCA territory. No sign of acute infarction. Compared to the study of last January, findings are progressive particularly in the left frontal region, basal ganglia and external capsule region. 2. No sign of hemorrhage. 3. These results were communicated to Dr. Rory Percy at 3:29 pmon 1/3/2021by text page via the [facility] messaging system. Electronically Signed   By: Nelson Chimes M.D.   On: 12/16/2019 15:30    Pending Labs Unresulted Labs (From admission, onward)    Start     Ordered   12/18/19 0500   CBC  Tomorrow morning,   R     12/17/19 1332   12/18/19 [id number]  Basic metabolic  panel  Tomorrow morning,   R     12/17/19 1332   12/17/19 1329  Culture, blood (routine x 2)  BLOOD CULTURE X 2,   R (with STAT occurrences)     12/17/19 1332   12/17/19 1329  Culture, Urine  ONCE - STAT,   STAT     12/17/19 1332          Vitals/Pain Today's Vitals   12/17/19 1700 12/17/19 1715 12/17/19 1756 12/17/19 1815  BP: (!) 184/96 (!) 188/97 (!) 186/103 (!) 177/106  Pulse: 97 100  (!) 102  Resp: (!) 21 (!) 24  (!) 31  Temp:      TempSrc:      SpO2: 97% 99%  99%  Weight:      Height:      PainSc:        Isolation Precautions No active isolations  Medications Medications  nicotine (NICODERM CQ - dosed in mg/24 hours) patch 21 mg (21 mg Transdermal Patch Applied 12/17/19 0937)  atorvastatin (LIPITOR) tablet 80 mg (0 mg Oral Hold 12/16/19 2049)  clopidogrel (PLAVIX) tablet 75 mg (75 mg Oral Not Given 12/17/19 0906)  enoxaparin (LOVENOX) injection 40 mg (40 mg Subcutaneous Given 12/16/19 2143)   stroke: mapping our early stages of recovery book ( Does not apply Not Given 12/16/19 2105)  acetaminophen (TYLENOL) tablet 650 mg ( Oral See Alternative 12/17/19 1008)    Or  acetaminophen (TYLENOL) 160 MG/5ML solution 650 mg ( Per Tube See Alternative 12/17/19 1008)    Or  acetaminophen (TYLENOL) suppository 650 mg (650 mg Rectal Given 12/17/19 1008)  senna-docusate (Senokot-S) tablet 1 tablet (has no administration in time range)  levETIRAcetam (KEPPRA) IVPB 500 mg/100 mL premix (0 mg Intravenous Stopped 12/17/19 1019)  LORazepam (ATIVAN) tablet 1-4 mg ( Oral See Alternative 12/17/19 1810)    Or  LORazepam (ATIVAN) injection 1-4 mg (2 mg Intravenous Given 12/17/19 1810)  thiamine tablet 100 mg ( Oral See Alternative 12/17/19 0937)    Or  thiamine (B-1) injection 100 mg (100 mg Intravenous Given [id number])  folic acid (FOLVITE) tablet 1 mg (1 mg Oral Not Given 12/17/19 0906)  multivitamin with minerals tablet 1 tablet  (1 tablet Oral Not Given 12/17/19 0906)  aspirin suppository 300 mg (300 mg Rectal Given 12/17/19 0937)  labetalol (NORMODYNE) injection 10 mg (10 mg Intravenous Given 12/17/19 0822)  cefTRIAXone (ROCEPHIN) 1 g in sodium chloride 0.9 % 100 mL IVPB (0 g Intravenous Stopped 12/17/19 1600)  metoprolol tartrate (LOPRESSOR) injection 2.5 mg (2.5 mg Intravenous Given 12/17/19 1811)  0.9 %  sodium chloride infusion ( Intravenous New Bag/Given 12/17/19 1530)  sodium chloride flush (NS) 0.9 % injection 3 mL (3 mLs Intravenous Given 12/16/19 1605)  diphenhydrAMINE (BENADRYL) injection 25 mg (25 mg Intravenous Given 12/16/19 1530)  iohexol (OMNIPAQUE) 350 MG/ML injection 125 mL (125 mLs Intravenous Contrast Given 12/16/19 1546)  levETIRAcetam (KEPPRA) IVPB 1500 mg/ 100 mL premix (0 mg Intravenous Stopped 12/16/19 1640)  LORazepam (ATIVAN) injection 2 mg (2 mg Intravenous Given 12/16/19 1617)    Mobility non-ambulatory High fall risk   Focused Assessments Neuro Assessment Handoff:  Swallow screen pass? No    NIH Stroke Scale ( + Modified Stroke Scale Criteria)  Interval: Initial(By Dr Rory Percy) Level of Consciousness (1a.)   : Alert, keenly responsive LOC Questions (1b. )   +: Answers neither question correctly LOC Commands (1c. )   + : Performs one task correctly Best Gaze (2. )  +:  Normal Visual (3. )  +: No visual loss Facial Palsy (4. )    : Minor paralysis Motor Arm, Left (5a. )   +: No drift Motor Arm, Right (5b. )   +: No drift Motor Leg, Left (6a. )   +: No drift Motor Leg, Right (6b. )   +: Some effort against gravity Limb Ataxia (7. ): Absent Sensory (8. )   +: Normal, no sensory loss Best Language (9. )   +: Mute, global aphasia Dysarthria (10. ): Severe dysarthria, patient's speech is so slurred as to be unintelligible in the absence of or out of proportion to any dysphasia, or is mute/anarthric Extinction/Inattention (11.)   +: No Abnormality Modified SS Total  +: 8 Complete NIHSS TOTAL: 18      Neuro Assessment: Exceptions to [facility] Neuro Checks:   Initial(By Dr Rory Percy) (12/16/19 1605)  Last Documented NIHSS Modified Score: 8 (12/17/19 0820) Has TPA been given? No If patient is a Neuro Trauma and patient is going to OR before floor call report to [facility] nurse: [phone] or [phone]     R Recommendations: See Admitting Provider Note  Report given to:   Additional Notes:

## 2019-12-17 NOTE — Progress Notes (Signed)
Portable EEG completed, results pending. 

## 2019-12-17 NOTE — Progress Notes (Signed)
Reason for consult:   Subjective:patient awake, mumbles incoherently    [id number] to obtain due to poor mental status  Examination  Vital signs in last 24 hours: Temp:  [98.5 F (36.9 C)-101.5 F (38.6 C)] 100.7 F (38.2 C) (01/04 1112) Pulse Rate:  [103-140] 111 (01/04 1000) Resp:  [15-31] 17 (01/04 1000) BP: (170-224)/(77-120) 192/102 (01/04 1000) SpO2:  [90 %-100 %] 92 % (01/04 1000) Weight:  [70.6 kg] 70.6 kg (01/03 1559)  General: lying in bed CVS: pulse-normal rate and rhythm RS: breathing comfortably Extremities: normal   Neuro: MS: Alert, mumbles incoherently, follows simple commands CN: pupils equal and reactive,  EOMI, face symmetric, tongue midline Motor: He is antigravity in both upper extremities, wiggles toes in both legs Reflexes:  plantars: flexor Coordination: Not tested Gait: not tested  Basic Metabolic Panel: Recent Labs  Lab 12/16/19 1515 12/16/19 1535  NA 145 145  K 3.6 3.4*  CL 111 110  CO2 24  --   GLUCOSE 131* 121*  BUN 16 18  CREATININE 1.14 1.10  CALCIUM 9.5  --     CBC: Recent Labs  Lab 12/16/19 1515 12/16/19 1535  WBC 11.1*  --   NEUTROABS 5.9  --   HGB 13.8 14.6  HCT 43.4 43.0  MCV 110.7*  --   PLT 286  --      Coagulation Studies: Recent Labs    12/16/19 1515  LABPROT 12.8  INR 1.0    Imaging Reviewed:     ASSESSMENT AND PLAN  74 year old male with past medical history of CVA ( L MCA), hepatitis C, hypertension and seizure disorder presents to the emergency room difficulty talking and worsening right-sided deficits followed by seizure characterized with facial twitching witnessed in the ER. MRI brain was completed which showed extensive old left MCA infarction, no acute stroke.  Presentation felt to be likely secondary to seizure. Workup also included CT angiogram of the neck : noted to have left carotid stenosis of approximately 80% and right carotid stenosis at 50 to 70%.  CT angiogram of the head showed  intracranial atherosclerotic disease with bilateral M1 segment stenosis and PCA  stenosis with significant P1 segment stenosis.   Seizure  Chronic left MCA stroke Intracranial atherosclerotic disease Bilateral carotid stenosis  Recommendations Routine EEG in the morning Continue Keppra 500 mg twice daily Continue aspirin and Plavix, high dose statin for intracranial and extracranial atherosclerotic disease Will defer evaluation for carotid endarterectomy as MRI negative for any acute infarcts, outpatient stroke neurology and vascular surgery follow-up PT OT and speech therapy Seizure precautions   Karena Addison Ramy Greth [facility] Pager Number [phone] For questions after 7pm please refer to AMION to reach the Neurologist on call

## 2019-12-17 NOTE — Progress Notes (Signed)
  Echocardiogram 2D Echocardiogram has been performed.  Ronald Wolf 12/17/2019, 12:21 PM

## 2019-12-17 NOTE — Progress Notes (Signed)
Spoke to the pts daughter, update was given. Daughter requested facetime call to see the pt. Pt was able to mumble words once he seen and heard the daughter. Unable to understand what the pt was saying. Will notify daughter with any significant changes.

## 2019-12-17 NOTE — ED Triage Notes (Signed)
Pt does not follow directions at this time.  Pt not taking PO'S

## 2019-12-17 NOTE — Progress Notes (Signed)
PROGRESS NOTE   [place]  [id number]    DOB: 05-02-1946    DOA: 12/16/2019  PCP: Mosie Lukes, MD   I have briefly reviewed patients previous medical records in [facility].  Chief Complaint:   Chief Complaint  Patient presents with  . Code Stroke    Brief Narrative:  74 year old male with PMH of anxiety, depression, GERD, hepatitis C, hypertension, hyperlipidemia, trigeminal neuralgia s/p, knife treatment, CVA (left MCA), alcohol dependence in remission, vascular dementia, seizure disorder post stroke not on medications PTA, s/p urostomy, followed by family unresponsive at 1 PM on 1/3.  He reportedly had difficulty talking, worsening right-sided deficits followed by seizures characterized by facial twitching witnessed in the ER.  Acute stroke ruled out by MRI.  Admitted for suspected seizures, confirmed by EEG.  Neurology consulted.   Assessment & Plan:  Principal Problem:   Altered mental status Active Problems:   HTN (hypertension)   Hyperglycemia   Seizure (HCC)   Polysubstance abuse (HCC)   History of stroke   Seizure disorder  MRI brain negative for acute stroke.  EEG confirms epileptogenicity and cortical dysfunction in left frontotemporal region likely secondary to underlying encephalomalacia from prior CVA along with diffuse encephalopathic changes.  Neurology consultation and follow-up appreciated.  Continue IV Keppra 500 mg twice daily until speech therapy and able to take safely by mouth.  S/p chronic left MCA stroke  No acute stroke based on repeat MRI.  Neurology input appreciated.  Changed aspirin to rectal until able to take p.o. after which continue aspirin, Plavix and high-dose statins.  Has right hemiparesis.  Bilateral carotid artery stenosis  CTA neck showed left carotid artery stenosis of approximately 80% and right carotid artery stenosis at 50-70%.  Outpatient follow-up.  Suspected sepsis due to UTI  Present on admission  including fever of 101.5, tachypnea, tachycardia, mild leukocytosis and suspected urinary source.  Gentle IV fluids and start IV ceftriaxone pending urine culture results.  We will also get chest x-ray in case he has aspiration pneumonia due to his dysphagia.  Check blood cultures x2.  Acute metabolic encephalopathy  Suspect due to postictal state.  Currently alert but not oriented, does not follow commands, unsafe for oral intake.  Could also be related to fever/sepsis, possibly from UTI.  Likely worse than his baseline.  Extensive imaging and EEG results as noted below.  No concern for metabolic derangements as cause.  TSH 1.4.  A1c 4.5.  UDS negative.  Monitor closely.  Essential hypertension/hypertensive urgency  Blood pressures markedly elevated in the ED up to 217/93.  Also associated sinus tachycardia in the 130s.  Appears to be on metoprolol 12.5 mg twice daily at home.  Currently n.p.o.  Hence will place on scheduled IV metoprolol 2.5 mg every 6 hourly and as needed IV labetalol 10 mg every 2 hours as needed.  Aggressive blood pressure control since no acute CVA.  Initiate p.o. meds when able.  Hyperlipidemia  Resume home statins when able.  LDL 78.  Tobacco abuse  Has nicotine patch.  Reported polysubstance abuse  UDS negative.  Suspected dysphagia  N.p.o. pending ST evaluation.  S/p urostomy   DVT prophylaxis: Lovenox Code Status: Full Family Communication: None at bedside Disposition: To be determined pending clinical improvement   Consultants:   Neurology  Procedures:   None  Antimicrobials:   IV ceftriaxone   Subjective:  Patient is alert, tracks activity around the room with his eyes, mumbles incomprehensibly, not oriented,  does not follow instructions.  Objective:   Vitals:   12/17/19 1005 12/17/19 1112 12/17/19 1115 12/17/19 1130  BP:   (!) 192/95 (!) 187/94  Pulse:   (!) 123 (!) 113  Resp:   (!) 27 (!) 27  Temp: (!) 101.5 F (38.6 C)  (!) 100.7 F (38.2 C)    TempSrc: Oral Oral    SpO2:   91% 95%  Weight:      Height:        General exam: Elderly male, moderately built and thinly nourished, chronically ill looking lying comfortably propped up in bed without distress.  Oral mucosa dry. Respiratory system: Clear to auscultation but poor inspiratory effort. Respiratory effort normal. Cardiovascular system: S1 & S2 heard, RRR. No JVD, murmurs, rubs, gallops or clicks. No pedal edema. Gastrointestinal system: Abdomen is nondistended, soft and nontender. No organomegaly or masses felt. Normal bowel sounds heard.  Has urostomy with clear urine. Central nervous system: Mental status as noted above.  Aphasic Extremities: Purposeful movements of left upper extremity, at least grade 3 x 5 power.  At least grade 2 x 5 power in bilateral lower extremities.?  Contractures in right upper extremity with grade 2 x 5 power. Skin: No rashes, lesions or ulcers Psychiatry: Judgement and insight impaired. Mood & affect cannot be assessed.     Data Reviewed:   I have personally reviewed following labs and imaging studies   CBC: Recent Labs  Lab 12/16/19 1515 12/16/19 1535  WBC 11.1*  --   NEUTROABS 5.9  --   HGB 13.8 14.6  HCT 43.4 43.0  MCV 110.7*  --   PLT 286  --     Basic Metabolic Panel: Recent Labs  Lab 12/16/19 1515 12/16/19 1535  NA 145 145  K 3.6 3.4*  CL 111 110  CO2 24  --   GLUCOSE 131* 121*  BUN 16 18  CREATININE 1.14 1.10  CALCIUM 9.5  --     Liver Function Tests: Recent Labs  Lab 12/16/19 1515  AST 36  ALT 40  ALKPHOS 100  BILITOT 0.9  PROT 7.5  ALBUMIN 3.7    CBG: Recent Labs  Lab 12/16/19 1519  GLUCAP 117*    Microbiology Studies:   Recent Results (from the past 240 hour(s))  SARS CORONAVIRUS 2 (TAT 6-24 HRS) Nasopharyngeal Nasopharyngeal Swab     Status: None   Collection Time: 12/16/19  3:55 PM   Specimen: Nasopharyngeal Swab  Result Value Ref Range Status   SARS Coronavirus  2 NEGATIVE NEGATIVE Final    Comment: (NOTE) SARS-CoV-2 target nucleic acids are NOT DETECTED. The SARS-CoV-2 RNA is generally detectable in upper and lower respiratory specimens during the acute phase of infection. Negative results do not preclude SARS-CoV-2 infection, do not rule out co-infections with other pathogens, and should not be used as the sole basis for treatment or other patient management decisions. Negative results must be combined with clinical observations, patient history, and epidemiological information. The expected result is Negative. Fact Sheet for Patients: SugarRoll.be Fact Sheet for Healthcare Providers: https://www.woods-mathews.com/ This test is not yet approved or cleared by the [place] FDA and  has been authorized for detection and/or diagnosis of SARS-CoV-2 by FDA under an Emergency Use Authorization (EUA). This EUA will remain  in effect (meaning this test can be used) for the duration of the COVID-19 declaration under Section 56 4(b)(1) of the Act, 21 U.S.C. section 360bbb-3(b)(1), unless the authorization is terminated or revoked sooner. Performed at  [facility], [place] [address], [place], [place] [address]      Radiology Studies:  CT Code Stroke CTA Head W/WO contrast  Result Date: 12/16/2019 EXAM: CT ANGIOGRAPHY HEAD AND NECK CT PERFUSION BRAIN TECHNIQUE: Multidetector CT imaging of the head and neck was performed using the standard protocol during bolus administration of intravenous contrast. Multiplanar CT image reconstructions and MIPs were obtained to evaluate the vascular anatomy. Carotid stenosis measurements (when applicable) are obtained utilizing NASCET criteria, using the distal internal carotid diameter as the denominator. Multiphase CT imaging of the brain was performed following IV bolus contrast injection. Subsequent parametric perfusion maps were calculated using RAPID software.  CONTRAST:  11mL OMNIPAQUE IOHEXOL 350 MG/ML SOLN COMPARISON:  Head CT same day.  CT angiography 08/24/2017. FINDINGS: CTA NECK FINDINGS Aortic arch: There is aortic atherosclerosis. The scan does not include the brachiocephalic vessel origins other than the left subclavian origin which is widely patent. Right carotid system: Common carotid artery widely patent to the bifurcation. Atherosclerotic disease at the carotid bifurcation and ICA bulb but no stenosis of the bulb beyond that of more distal cervical ICA. Left carotid system: Common carotid artery widely patent to the bifurcation. Atherosclerotic plaque at the carotid bifurcation and ICA bulb. Minimal diameter of the ICA bulb is 4 mm. Compared to a more distal cervical ICA diameter of 4.2 mm, this does not represent any significant stenosis. Vertebral arteries: 30% stenosis at both vertebral artery origins. Beyond the origins, both vessels are widely patent through the cervical region to the foramen magnum. Skeleton: Ordinary cervical spondylosis and facet osteoarthritis. Other neck: No mass or lymphadenopathy. Upper chest: Scarring and emphysematous changes at the apices. No acute process. Review of the MIP images confirms the above findings CTA HEAD FINDINGS Anterior circulation: Internal carotid arteries are patent through the carotid siphon regions, but there is severe atherosclerotic narrowing and irregularity, particularly on the left where there appears to be greater than 80% stenosis. Stenosis on the right estimated at 50-70%. Supraclinoid internal carotid arteries are widely patent. Both anterior cerebral arteries are widely patent. There is atherosclerotic disease of both M1 segments, 50% stenosis on the right and serial irregular 50-70% stenoses on the left. I do not see any acute distal large or medium vessel occlusion however. Posterior circulation: Dominant right vertebral artery widely patent to the basilar. Small left vertebral artery largely  terminates in PICA. Mild narrowing and irregularity of the basilar without flow limiting stenosis. Superior cerebellar and posterior cerebral arteries are patent. 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 Severe stenosis of the left P1 segment. Venous sinuses: Patent and normal. Anatomic variants: None significant. 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 No sign of acute infarction by head CT. CBF (<30%) Volume: 82mL Perfusion (Tmax>6.0s) volume: 50mL Mismatch Volume: 63mL Infarction Location:None IMPRESSION: No acute large vessel occlusion. Atherosclerotic disease at both carotid bifurcations. No significant stenosis. 30% stenosis at both vertebral artery origins. Severe atherosclerotic disease in both carotid siphon regions, worse on the left than the right. On the left, stenosis appears to be greater than 80%. On the right, stenosis is estimated at 50-70%. Atherosclerotic disease of both M1 segments. 50% stenosis on the right. Serial irregular 50-70% stenoses on the left. Atherosclerotic narrowing of both PCA branches as seen previously, with a severe stenosis of the P1 segment on the left. Electronically Signed  By: Nelson Chimes M.D.   On: 12/16/2019 15:57   CT Code Stroke CTA Neck W/WO contrast  Result Date: 12/16/2019 EXAM: CT ANGIOGRAPHY HEAD AND NECK CT PERFUSION BRAIN TECHNIQUE: Multidetector CT imaging of the head and neck was performed using the standard protocol during bolus administration of intravenous contrast. Multiplanar CT image reconstructions and MIPs were obtained to evaluate the vascular anatomy. Carotid stenosis measurements (when applicable) are obtained utilizing NASCET criteria, using the distal internal carotid diameter as the denominator. Multiphase CT imaging of the brain was performed following IV bolus contrast injection. Subsequent  parametric perfusion maps were calculated using RAPID software. CONTRAST:  132mL OMNIPAQUE IOHEXOL 350 MG/ML SOLN COMPARISON:  Head CT same day.  CT angiography 08/24/2017. FINDINGS: CTA NECK FINDINGS Aortic arch: There is aortic atherosclerosis. The scan does not include the brachiocephalic vessel origins other than the left subclavian origin which is widely patent. Right carotid system: Common carotid artery widely patent to the bifurcation. Atherosclerotic disease at the carotid bifurcation and ICA bulb but no stenosis of the bulb beyond that of more distal cervical ICA. Left carotid system: Common carotid artery widely patent to the bifurcation. Atherosclerotic plaque at the carotid bifurcation and ICA bulb. Minimal diameter of the ICA bulb is 4 mm. Compared to a more distal cervical ICA diameter of 4.2 mm, this does not represent any significant stenosis. Vertebral arteries: 30% stenosis at both vertebral artery origins. Beyond the origins, both vessels are widely patent through the cervical region to the foramen magnum. Skeleton: Ordinary cervical spondylosis and facet osteoarthritis. Other neck: No mass or lymphadenopathy. Upper chest: Scarring and emphysematous changes at the apices. No acute process. Review of the MIP images confirms the above findings CTA HEAD FINDINGS Anterior circulation: Internal carotid arteries are patent through the carotid siphon regions, but there is severe atherosclerotic narrowing and irregularity, particularly on the left where there appears to be greater than 80% stenosis. Stenosis on the right estimated at 50-70%. Supraclinoid internal carotid arteries are widely patent. Both anterior cerebral arteries are widely patent. There is atherosclerotic disease of both M1 segments, 50% stenosis on the right and serial irregular 50-70% stenoses on the left. I do not see any acute distal large or medium vessel occlusion however. Posterior circulation: Dominant right vertebral artery  widely patent to the basilar. Small left vertebral artery largely terminates in PICA. Mild narrowing and irregularity of the basilar without flow limiting stenosis. Superior cerebellar and posterior cerebral arteries are patent. 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 Severe stenosis of the left P1 segment. Venous sinuses: Patent and normal. Anatomic variants: None significant. 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 No sign of acute infarction by head CT. CBF (<30%) Volume: 72mL Perfusion (Tmax>6.0s) volume: 19mL Mismatch Volume: 9mL Infarction Location:None IMPRESSION: No acute large vessel occlusion. Atherosclerotic disease at both carotid bifurcations. No significant stenosis. 30% stenosis at both vertebral artery origins. Severe atherosclerotic disease in both carotid siphon regions, worse on the left than the right. On the left, stenosis appears to be greater than 80%. On the right, stenosis is estimated at 50-70%. Atherosclerotic disease of both M1 segments. 50% stenosis on the right. Serial irregular 50-70% stenoses on the left. Atherosclerotic narrowing of both PCA branches as seen previously, with a severe stenosis of the P1 segment on the left. Electronically Signed   By: Jan Fireman.D.  On: 12/16/2019 15:57   MR Brain Wo Contrast (neuro protocol)  Result Date: 12/16/2019 CLINICAL DATA:  Right arm weakness and gait disturbance. Abnormal gaze. Old infarctions left MCA territory. EXAM: MRI HEAD WITHOUT CONTRAST TECHNIQUE: Multiplanar, multiecho pulse sequences of the brain and surrounding structures were obtained without intravenous contrast. COMPARISON:  CT studies earlier same day.  MRI 04/28/2019 FINDINGS: Brain: The study suffers from some motion degradation. No sign of acute infarction based on diffusion imaging. Punctate focus of restricted  diffusion in the left posterior frontal region and left parietal region in the areas of old infarction not likely related to acute insult. No brainstem or cerebellar infarction. Right cerebral hemisphere shows mild chronic small-vessel change of the deep white matter. On the left, there is an extensive old left MCA territory infarction affecting the left basal ganglia, external capsule and frontoparietal brain. Hemosiderin deposition in the regions of old left MCA infarction. Volume loss with ex vacuo enlargement of the left lateral ventricle. Mass lesion or extra-axial collection Vascular: Major vessels at the base of the brain show flow. 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 No sign of acute or subacute infarction. 2 small foci of restricted diffusion within the regions of old infarction in the left posterior frontal and parietal region not felt to relate to acute insult. Hemosiderin deposition in the regions of the old left MCA infarction. Electronically Signed   By: Nelson Chimes M.D.   On: 12/16/2019 17:53   CT Code Stroke Cerebral Perfusion with contrast  Result Date: 12/16/2019 EXAM: CT ANGIOGRAPHY HEAD AND NECK CT PERFUSION BRAIN TECHNIQUE: Multidetector CT imaging of the head and neck was performed using the standard protocol during bolus administration of intravenous contrast. Multiplanar CT image reconstructions and MIPs were obtained to evaluate the vascular anatomy. Carotid stenosis measurements (when applicable) are obtained utilizing NASCET criteria, using the distal internal carotid diameter as the denominator. Multiphase CT imaging of the brain was performed following IV bolus contrast injection. Subsequent parametric perfusion maps were calculated using RAPID software. CONTRAST:  121mL OMNIPAQUE IOHEXOL 350 MG/ML SOLN COMPARISON:  Head CT same day.  CT angiography 08/24/2017. FINDINGS: CTA  NECK FINDINGS Aortic arch: There is aortic atherosclerosis. The scan does not include the brachiocephalic vessel origins other than the left subclavian origin which is widely patent. Right carotid system: Common carotid artery widely patent to the bifurcation. Atherosclerotic disease at the carotid bifurcation and ICA bulb but no stenosis of the bulb beyond that of more distal cervical ICA. Left carotid system: Common carotid artery widely patent to the bifurcation. Atherosclerotic plaque at the carotid bifurcation and ICA bulb. Minimal diameter of the ICA bulb is 4 mm. Compared to a more distal cervical ICA diameter of 4.2 mm, this does not represent any significant stenosis. Vertebral arteries: 30% stenosis at both vertebral artery origins. Beyond the origins, both vessels are widely patent through the cervical region to the foramen magnum. Skeleton: Ordinary cervical spondylosis and facet osteoarthritis. Other neck: No mass or lymphadenopathy. Upper chest: Scarring and emphysematous changes at the apices. No acute process. Review of the MIP images confirms the above findings CTA HEAD FINDINGS Anterior circulation: Internal carotid arteries are patent through the carotid siphon regions, but there is severe atherosclerotic narrowing and irregularity, particularly on the left where there appears to be greater than 80% stenosis. Stenosis on the right estimated at 50-70%. Supraclinoid internal carotid arteries are widely patent. Both anterior cerebral  arteries are widely patent. There is atherosclerotic disease of both M1 segments, 50% stenosis on the right and serial irregular 50-70% stenoses on the left. I do not see any acute distal large or medium vessel occlusion however. Posterior circulation: Dominant right vertebral artery widely patent to the basilar. Small left vertebral artery largely terminates in PICA. Mild narrowing and irregularity of the basilar without flow limiting stenosis. Superior cerebellar and  posterior cerebral arteries are patent. 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 Severe stenosis of the left P1 segment. Venous sinuses: Patent and normal. Anatomic variants: None significant. 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 No sign of acute infarction by head CT. CBF (<30%) Volume: 70mL Perfusion (Tmax>6.0s) volume: 60mL Mismatch Volume: 43mL Infarction Location:None IMPRESSION: No acute large vessel occlusion. Atherosclerotic disease at both carotid bifurcations. No significant stenosis. 30% stenosis at both vertebral artery origins. Severe atherosclerotic disease in both carotid siphon regions, worse on the left than the right. On the left, stenosis appears to be greater than 80%. On the right, stenosis is estimated at 50-70%. Atherosclerotic disease of both M1 segments. 50% stenosis on the right. Serial irregular 50-70% stenoses on the left. Atherosclerotic narrowing of both PCA branches as seen previously, with a severe stenosis of the P1 segment on the left. Electronically Signed   By: [place] Chimes M.D.   On: 12/16/2019 15:57   EEG adult  Result Date: 12/17/2019 [name], MD     12/17/2019 11:55 AM Patient Name: [place] MRN: [id number] Epilepsy Attending: [name] Referring Physician/Provider: Dr. Karmen Bongo Date: 12/17/2019 Duration: 28.50 minutes Patient history: 74 year old male with left MCA infarct presented with seizure-like episode.  EEG to evaluate for seizures. Level of alertness: Awake AEDs during EEG study: Keppra Technical aspects: This EEG study was done with scalp electrodes positioned according to the 10-20 International system of electrode placement. Electrical activity was acquired at a sampling rate of 500Hz  and reviewed with a high frequency filter of 70Hz  and a low frequency filter of 1Hz . EEG data were recorded  continuously and digitally stored. Description: During awake state, no clear posterior dominant rhythm was seen.  EEG showed continuous generalized, maximal left frontotemporal 3 to 5 Hz slowing.  Sharp waves were also seen in left frontotemporal region.  Hyperventilation and photic stimulation were not performed. Abnormality -Continuous slow, generalized, maximal left frontotemporal -Sharp waves, left frontotemporal IMPRESSION: This study showed evidence of epileptogenicity and cortical dysfunction in left frontotemporal region likely secondary to underlying encephalomalacia.  Additionally there is evidence of mild to moderate diffuse encephalopathy, nonspecific etiology.  No seizures were seen throughout the recording. [name]   CT HEAD CODE STROKE WO CONTRAST  Result Date: 12/16/2019 CLINICAL DATA:  Code stroke. Right sided arm weakness, gait disturbance and abnormal gaze. EXAM: CT HEAD WITHOUT CONTRAST TECHNIQUE: Contiguous axial images were obtained from the base of the skull through the vertex without intravenous contrast. COMPARISON:  MRI 12/18/2018 FINDINGS: Brain: No abnormality is seen affecting the brainstem or cerebellum. Cerebral hemispheres show atrophy with chronic small-vessel ischemic changes of the white matter. There is old infarction in the left middle cerebral artery territory with encephalomalacia and gliosis. This is progressive compared to the study of last year, particularly in the frontal region and left basal ganglia/external capsule. No finding typical of acute insult. No mass, hemorrhage, hydrocephalus or  extra-axial collection. Vascular: There is atherosclerotic calcification of the major vessels at the base of the brain. Skull: Negative Sinuses/Orbits: Clear/normal Other: None ASPECTS ([place] Stroke Program Early CT Score) difficult to apply the setting of extensive old left MCA territory infarction. No sign of acute infarction suspected. IMPRESSION: 1. Extensive old  ischemic changes in the left MCA territory. No sign of acute infarction. Compared to the study of last January, findings are progressive particularly in the left frontal region, basal ganglia and external capsule region. 2. No sign of hemorrhage. 3. These results were communicated to Dr. Rory Percy at 3:29 pmon 1/3/2021by text page via the [facility] messaging system. Electronically Signed   By: Nelson Chimes M.D.   On: 12/16/2019 15:30     Scheduled Meds:   .  stroke: mapping our early stages of recovery book   Does not apply Once  . aspirin  300 mg Rectal Daily  . atorvastatin  80 mg Oral q1800  . clopidogrel  75 mg Oral Daily  . enoxaparin (LOVENOX) injection  40 mg Subcutaneous Q24H  . folic acid  1 mg Oral Daily  . multivitamin with minerals  1 tablet Oral Daily  . nicotine  21 mg Transdermal Daily  . thiamine  100 mg Oral Daily   Or  . thiamine  100 mg Intravenous Daily    Continuous Infusions:   . sodium chloride 50 mL/hr at 12/17/19 0041  . levETIRAcetam Stopped (12/17/19 1019)     LOS: 1 day     Vernell Leep, MD, [place], [facility]. [facility]    To contact the attending provider between 7A-7P or the covering provider during after hours 7P-7A, please log into the web site [email or web address] and access using universal [place] password for that web site. If you do not have the password, please call the hospital operator.  12/17/2019, 1:05 PM

## 2019-12-18 ENCOUNTER — Inpatient Hospital Stay (HOSPITAL_COMMUNITY): Payer: Medicare PPO

## 2019-12-18 DIAGNOSIS — R400 Somnolence: Secondary | ICD-10-CM

## 2019-12-18 DIAGNOSIS — E870 Hyperosmolality and hypernatremia: Secondary | ICD-10-CM

## 2019-12-18 LAB — CBC
HCT: 42.5 % (ref 39.0–52.0)
Hemoglobin: 13.5 g/dL (ref 13.0–17.0)
MCH: 34.9 pg — ABNORMAL HIGH (ref 26.0–34.0)
MCHC: 31.8 g/dL (ref 30.0–36.0)
MCV: 109.8 fL — ABNORMAL HIGH (ref 80.0–100.0)
Platelets: 254 10*3/uL (ref 150–400)
RBC: 3.87 MIL/uL — ABNORMAL LOW (ref 4.22–5.81)
RDW: 12.7 % (ref 11.5–15.5)
WBC: 17.4 10*3/uL — ABNORMAL HIGH (ref 4.0–10.5)
nRBC: 0 % (ref 0.0–0.2)

## 2019-12-18 LAB — BASIC METABOLIC PANEL
Anion gap: 14 (ref 5–15)
BUN: 15 mg/dL (ref 8–23)
CO2: 24 mmol/L (ref 22–32)
Calcium: 9.3 mg/dL (ref 8.9–10.3)
Chloride: 110 mmol/L (ref 98–111)
Creatinine, Ser: 1.03 mg/dL (ref 0.61–1.24)
GFR calc Af Amer: >60 mL/min (ref >60)
GFR calc non Af Amer: >60 mL/min (ref >60)
Glucose, Bld: 101 mg/dL — ABNORMAL HIGH (ref 70–99)
Potassium: 3.6 mmol/L (ref 3.5–5.1)
Sodium: 148 mmol/L — ABNORMAL HIGH (ref 135–145)

## 2019-12-18 LAB — MRSA PCR SCREENING: MRSA by PCR: NEGATIVE

## 2019-12-18 LAB — LACTIC ACID, PLASMA
Lactic Acid, Venous: 3.5 mmol/L (ref 0.5–1.9)
Lactic Acid, Venous: 3.6 mmol/L (ref 0.5–1.9)

## 2019-12-18 LAB — URINE CULTURE

## 2019-12-18 LAB — INFLUENZA PANEL BY PCR (TYPE A & B)
Influenza A By PCR: NEGATIVE
Influenza B By PCR: NEGATIVE

## 2019-12-18 MED ORDER — SODIUM CHLORIDE 0.9 % IV BOLUS (SEPSIS)
1000.0000 mL | Freq: Once | INTRAVENOUS | Status: AC
  Administered 2019-12-18: 1000 mL via INTRAVENOUS

## 2019-12-18 MED ORDER — VANCOMYCIN HCL IN DEXTROSE 1-5 GM/200ML-% IV SOLN
1000.0000 mg | Freq: Once | INTRAVENOUS | Status: DC
  Filled 2019-12-18: qty 200

## 2019-12-18 MED ORDER — SODIUM CHLORIDE 0.45 % IV SOLN: INTRAVENOUS | Status: AC

## 2019-12-18 MED ORDER — PIPERACILLIN-TAZOBACTAM 3.375 G IVPB
3.3750 g | Freq: Three times a day (TID) | INTRAVENOUS | Status: AC
  Administered 2019-12-18 – 2019-12-23 (×17): 3.375 g via INTRAVENOUS
  Filled 2019-12-18 (×18): qty 50

## 2019-12-18 MED ORDER — VANCOMYCIN HCL 1250 MG/250ML IV SOLN
1250.0000 mg | INTRAVENOUS | Status: DC
  Administered 2019-12-19 – 2019-12-20 (×2): 1250 mg via INTRAVENOUS
  Filled 2019-12-18 (×3): qty 250

## 2019-12-18 MED ORDER — METOPROLOL TARTRATE 5 MG/5ML IV SOLN
5.0000 mg | Freq: Four times a day (QID) | INTRAVENOUS | Status: DC
  Administered 2019-12-18 – 2019-12-19 (×5): 5 mg via INTRAVENOUS
  Filled 2019-12-18 (×5): qty 5

## 2019-12-18 MED ORDER — VANCOMYCIN HCL 1250 MG/250ML IV SOLN
1250.0000 mg | Freq: Once | INTRAVENOUS | Status: AC
  Administered 2019-12-18: 1250 mg via INTRAVENOUS
  Filled 2019-12-18: qty 250

## 2019-12-18 NOTE — Progress Notes (Signed)
Administered 10mg  Labetalol for BP 181/90. Will continue to monitor pt.

## 2019-12-18 NOTE — Progress Notes (Signed)
Notified by NT that MEWS was a 5. BP 193/96, temp 102.5 orally, respiratory rate was 27. Tylenol suppository and PRN labetalol given. Dr. Algis Liming notified. Rapid Response notified. Will follow protocol. [place]

## 2019-12-18 NOTE — Progress Notes (Signed)
CRITICAL VALUE ALERT  Critical Value:  Lactic Acid 3.5  Date & Time Notied:  [id number]  Provider Notified: Dr. Algis Liming   Orders Received/Actions taken: awaiting orders

## 2019-12-18 NOTE — Progress Notes (Addendum)
PROGRESS NOTE   CORBITT FOSS  [id number]    DOB: 06-03-1946    DOA: 12/16/2019  PCP: Mosie Lukes, MD   I have briefly reviewed patients previous medical records in [facility].  Chief Complaint:   Chief Complaint  Patient presents with  . Code Stroke    Brief Narrative:  74 year old male with PMH of anxiety, depression, GERD, hepatitis C, hypertension, hyperlipidemia, trigeminal neuralgia s/p, knife treatment, CVA (left MCA), alcohol dependence in remission, vascular dementia, seizure disorder post stroke not on medications PTA, s/p urostomy, followed by family unresponsive at 1 PM on 1/3.  He reportedly had difficulty talking, worsening right-sided deficits followed by seizures characterized by facial twitching witnessed in the ER.  Acute stroke ruled out by MRI.  Admitted for suspected seizures, confirmed by EEG.  Neurology consulted.  Hospital course complicated by febrile illness, sepsis due to UTI versus other etiology. On 1/5, treated with aggressive IVF per sepsis protocol, developed congested cough and IV changed to NSL.   Assessment & Plan:  Principal Problem:   Altered mental status Active Problems:   HTN (hypertension)   Hyperglycemia   Seizure (HCC)   Polysubstance abuse (HCC)   History of stroke   Seizure disorder  MRI brain negative for acute stroke.  EEG confirms epileptogenicity and cortical dysfunction in left frontotemporal region likely secondary to underlying encephalomalacia from prior CVA along with diffuse encephalopathic changes.  Continue IV Keppra 500 mg twice daily until speech therapy and able to take safely by mouth.  Neurology follow-up appreciated.  I discussed with Dr. Lorraine Lax, due to worsening mental status changes today (less responsive compared to yesterday), repeated spot EEG without seizure-like activity.  Current mental status attributed to sepsis related acute encephalopathy.  S/p chronic left MCA stroke  No acute stroke based on  repeat MRI.  Neurology input appreciated.  Changed aspirin to rectal until able to take p.o. after which continue aspirin, Plavix and high-dose statins.  Has right hemiparesis.  Bilateral carotid artery stenosis  CTA neck showed left carotid artery stenosis of approximately 80% and right carotid artery stenosis at 50-70%.  Outpatient follow-up with neurology and vascular surgery..  Sepsis due to UTI versus other etiologies-principal problem now.  Present on admission including fever of 101.5, tachypnea, tachycardia, mild leukocytosis and suspected urinary source.  Despite gentle IV fluids and IV ceftriaxone, developed high fevers overnight.  Repeated chest x-ray and no acute cardiopulmonary disease, personally reviewed.  Flu panel PCR negative.  MRSA PCR negative.  Blood cultures x2 from 1/4: Negative to date.  Urine culture from 1/4 likely contaminant, will repeat but may not be helpful now that on antibiotics.  Worsened leukocytosis  Empirically started IV ceftriaxone discontinued and initiated IV Zosyn and vancomycin per pharmacy.    On 1/5 initiated IV fluids per sepsis protocol, developed congested cough without respiratory distress or tachypnea.  Changed IV fluids to normal saline lock.  Lactate elevated, continue to trend.  Acute metabolic encephalopathy  Suspect due to sepsis and possible postictal state.  Less alert than yesterday and not oriented, does not follow commands, unsafe for oral intake.   Likely worse than his baseline.  Extensive imaging and EEG results as noted below.  No concern for metabolic derangements as cause.  TSH 1.4.  A1c 4.5.  UDS negative.  Monitor closely.  Hypernatremia  Likely due to volume depletion.  Changed normal saline to IV half-normal saline follow BMP in a.m.  Essential hypertension/hypertensive urgency  Blood pressures  markedly elevated in the ED up to 217/93.  Also associated sinus tachycardia in the 130s.  Appears to be on  metoprolol 12.5 mg twice daily at home.  Currently n.p.o. changed IV metoprolol to 5 mg every 6 hours and continue as needed IV labetalol 10 mg every 2 hours as needed.  Aggressive blood pressure control since no acute CVA.  Initiate p.o. meds when able.  Hyperlipidemia  Resume home statins when able.  LDL 78.  Tobacco abuse  Has nicotine patch.  Reported polysubstance abuse  UDS negative.  Suspected dysphagia  N.p.o. pending ST evaluation.  May not be able to evaluate due to current mental status changes  S/p urostomy  Alcohol use  As per daughter, a bottle of brandy would last him 1 to 2 weeks.  Currently encephalopathic without overt tremors.  Discontinued Ativan but continue to monitor CIWA.   DVT prophylaxis: Lovenox Code Status: Full Family Communication: Discussed in detail with patient's daughter at bedside.  She indicates that at baseline patient is coherent.  Updated critical nature of patient's illness.  She confirmed full code. Disposition: To be determined pending clinical improvement   Consultants:   Neurology  Procedures:   None  Antimicrobials:   IV ceftriaxone-discontinued IV Zosyn and vancomycin 1/5 >   Subjective:  Appears less alert, not tracking activity with eyes as he did yesterday in ED.  Spontaneous movement of all limbs.  Nonverbal and does not follow instructions.  As per RN, high fevers but no noted cough, nausea, vomiting, diarrhea or skin lesions.  Objective:   Vitals:   12/18/19 1548 12/18/19 1658 12/18/19 1704 12/18/19 1757  BP: (!) 161/95 (!) 176/97  (!) 182/90  Pulse: 92 88  81  Resp: (!) 24 18  (!) 24  Temp: 98.5 F (36.9 C)  (!) 100.6 F (38.1 C) 98.3 F (36.8 C)  TempSrc: Oral  Rectal Oral  SpO2: 98% 95%  97%  Weight:      Height:        General exam: Elderly male, moderately built and thinly nourished, chronically ill looking lying comfortably propped up in bed without distress.  Oral mucosa dry. Respiratory system:  Clear to auscultation.  No increased work of breathing. Cardiovascular system: S1 and S2 heard, RRR.  No JVD, murmurs or pedal edema.  Telemetry personally reviewed: Sinus tachycardia in the 100s. Gastrointestinal system: Abdomen is nondistended, soft and nontender. No organomegaly or masses felt. Normal bowel sounds heard.  Has urostomy with clear urine. Central nervous system: Mental status as noted above.  Nonverbal.  Not following instructions.  No facial asymmetry. Extremities: Seems to be moving all extremities today.?  Contractures in right upper extremity with grade 2 x 5 power. Skin: Examined in detail along with patient's RN, no acute findings to explain fever. Lymphatics: No obvious lymphadenopathy. Psychiatry: Judgement and insight impaired. Mood & affect cannot be assessed.     Data Reviewed:   I have personally reviewed following labs and imaging studies   CBC: Recent Labs  Lab 12/16/19 1515 12/16/19 1535 12/18/19 0331  WBC 11.1*  --  17.4*  NEUTROABS 5.9  --   --   HGB 13.8 14.6 13.5  HCT 43.4 43.0 42.5  MCV 110.7*  --  109.8*  PLT 286  --  0000000    Basic Metabolic Panel: Recent Labs  Lab 12/16/19 1515 12/16/19 1535 12/18/19 0331  NA 145 145 148*  K 3.6 3.4* 3.6  CL 111 110 110  CO2 24  --  24  GLUCOSE 131* 121* 101*  BUN 16 18 15   CREATININE 1.14 1.10 1.03  CALCIUM 9.5  --  9.3    Liver Function Tests: Recent Labs  Lab 12/16/19 1515  AST 36  ALT 40  ALKPHOS 100  BILITOT 0.9  PROT 7.5  ALBUMIN 3.7    CBG: Recent Labs  Lab 12/16/19 1519  GLUCAP 117*    Microbiology Studies:   Recent Results (from the past 240 hour(s))  SARS CORONAVIRUS 2 (TAT 6-24 HRS) Nasopharyngeal Nasopharyngeal Swab     Status: None   Collection Time: 12/16/19  3:55 PM   Specimen: Nasopharyngeal Swab  Result Value Ref Range Status   SARS Coronavirus 2 NEGATIVE NEGATIVE Final    Comment: (NOTE) SARS-CoV-2 target nucleic acids are NOT DETECTED. The SARS-CoV-2 RNA  is generally detectable in upper and lower respiratory specimens during the acute phase of infection. Negative results do not preclude SARS-CoV-2 infection, do not rule out co-infections with other pathogens, and should not be used as the sole basis for treatment or other patient management decisions. Negative results must be combined with clinical observations, patient history, and epidemiological information. The expected result is Negative. Fact Sheet for Patients: SugarRoll.be Fact Sheet for Healthcare Providers: https://www.woods-mathews.com/ This test is not yet approved or cleared by the [place] FDA and  has been authorized for detection and/or diagnosis of SARS-CoV-2 by FDA under an Emergency Use Authorization (EUA). This EUA will remain  in effect (meaning this test can be used) for the duration of the COVID-19 declaration under Section 56 4(b)(1) of the Act, 21 U.S.C. section 360bbb-3(b)(1), unless the authorization is terminated or revoked sooner. Performed at [facility], [place] [address] [place], [place] [address]   Culture, Urine     Status: Abnormal   Collection Time: 12/17/19  3:43 PM   Specimen: Urine, Random  Result Value Ref Range Status   Specimen Description URINE, RANDOM  Final   Special Requests   Final    NONE Performed at [facility], [place] [address] [place], [place] [address]    Culture MULTIPLE SPECIES PRESENT, SUGGEST RECOLLECTION (A)  Final   Report Status 12/18/2019 FINAL  Final  Culture, blood (routine x 2)     Status: None (Preliminary result)   Collection Time: 12/17/19  8:58 PM   Specimen: BLOOD  Result Value Ref Range Status   Specimen Description BLOOD LEFT ANTECUBITAL  Final   Special Requests   Final    BOTTLES DRAWN AEROBIC ONLY Blood Culture results may not be optimal due to an inadequate volume of blood received in culture bottles   Culture   Final    NO GROWTH < 24 HOURS  Performed at [facility], [place] [address], [place], [place] [address]    Report Status PENDING  Incomplete  Culture, blood (routine x 2)     Status: None (Preliminary result)   Collection Time: 12/17/19  9:17 PM   Specimen: BLOOD LEFT HAND  Result Value Ref Range Status   Specimen Description BLOOD LEFT HAND  Final   Special Requests   Final    BOTTLES DRAWN AEROBIC ONLY Blood Culture results may not be optimal due to an inadequate volume of blood received in culture bottles   Culture   Final    NO GROWTH < 24 HOURS Performed at [facility], [place] [address] [place],  [address]    Report Status PENDING  Incomplete  MRSA  PCR Screening     Status: None   Collection Time: 12/18/19  8:38 AM   Specimen: Nasopharyngeal  Result Value Ref Range Status   MRSA by PCR NEGATIVE NEGATIVE Final    Comment:        The GeneXpert MRSA Assay (FDA approved for NASAL specimens only), is one component of a comprehensive MRSA colonization surveillance program. It is not intended to diagnose MRSA infection nor to guide or monitor treatment for MRSA infections. Performed at [facility], [place] [address], [place],  [address]      Radiology Studies:  EEG  Result Date: 12/18/2019 [name], MD     12/18/2019 12:51 PM Patient Name: Ronald Wolf MRN: [id number] Epilepsy Attending: [name] Referring Physician/Provider: Etta Quill, PA Date: 12/18/2019 Duration: 24.31 minutes  Patient history: 74 year old male with left MCA infarct presented with seizure-like episode, now with worsening ams.  EEG to evaluate for seizures.  Level of alertness: Lethargic  AEDs during EEG study: Keppra  Technical aspects: This EEG study was done with scalp electrodes positioned according to the 10-20 International system of electrode placement. Electrical activity was acquired at a sampling rate of 500Hz  and reviewed with a high frequency filter of 70Hz  and a low frequency filter of  1Hz . EEG data were recorded continuously and digitally stored.  Description: During awake state, no clear posterior dominant rhythm was seen.  EEG showed continuous generalized, maximal left frontotemporal 3 to 5 Hz slowing.  Sharp waves were also seen in left frontotemporal region.  Hyperventilation and photic stimulation were not performed.  Abnormality -Continuous slow, generalized, maximal left frontotemporal -Sharp waves, left frontotemporal  IMPRESSION: This study showed evidence of epileptogenicity and cortical dysfunction in left frontotemporal region likely secondary to underlying encephalomalacia.  Additionally there is evidence of mild to moderate diffuse encephalopathy, nonspecific etiology.  No seizures were seen throughout the recording. EEG is similar to previous study performed yesterday.  [name]   DG Chest Port 1 View  Result Date: 12/18/2019 CLINICAL DATA:  Fever EXAM: PORTABLE CHEST 1 VIEW COMPARISON:  12/17/2019 FINDINGS: The heart size and mediastinal contours are stable. No new focal airspace consolidation, pleural effusion, or pneumothorax. There is a 10 cm thin linear radiopaque structure projecting over the mid and right hemithorax, new from prior, and likely external to the patient. IMPRESSION: 1. No active cardiopulmonary disease. 2. 10 cm thin linear radiopaque structure projecting over the mid and right hemithorax, likely external to the patient. Electronically Signed   By: Davina Poke D.O.   On: 12/18/2019 09:16     Scheduled Meds:   . aspirin  300 mg Rectal Daily  . atorvastatin  80 mg Oral q1800  . clopidogrel  75 mg Oral Daily  . enoxaparin (LOVENOX) injection  40 mg Subcutaneous Q24H  . folic acid  1 mg Oral Daily  . metoprolol tartrate  5 mg Intravenous Q6H  . multivitamin with minerals  1 tablet Oral Daily  . nicotine  21 mg Transdermal Daily  . thiamine  100 mg Oral Daily   Or  . thiamine  100 mg Intravenous Daily    Continuous Infusions:    . sodium chloride 100 mL/hr at 12/18/19 0912  . levETIRAcetam 500 mg (12/18/19 0947)  . piperacillin-tazobactam (ZOSYN)  IV 3.375 g (12/18/19 1801)  . [START ON 12/19/2019] vancomycin       LOS: 2 days     Vernell Leep, MD, [place], [facility]. [facility]  To contact the attending provider between 7A-7P or the covering provider during after hours 7P-7A, please log into the web site [email or web address] and access using universal [place] password for that web site. If you do not have the password, please call the hospital operator.  12/18/2019, 6:28 PM

## 2019-12-18 NOTE — Progress Notes (Signed)
Nutrition Follow-up   RD working remotely.  DOCUMENTATION CODES:   Not applicable  INTERVENTION:  Diet advancement per MD and team as appropriate.  RD to continue to monitor.  NUTRITION DIAGNOSIS:   Inadequate oral intake related to inability to eat as evidenced by NPO status.  GOAL:   Patient will meet greater than or equal to 90% of their needs  MONITOR:   Diet advancement, Skin, Weight trends, Labs, I & O's  REASON FOR ASSESSMENT:   Consult Assessment of nutrition requirement/status  ASSESSMENT:   74 year old male with PMH of anxiety, depression, GERD, hepatitis C, hypertension, hyperlipidemia, trigeminal neuralgia s/p, knife treatment, CVA (left MCA), alcohol dependence in remission, vascular dementia, 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 MRI brain negative for acute stroke.  EEG confirms epileptogenicity and cortical dysfunction in left frontotemporal region.  Per MD, pt nonverbal and not following instructions. RD unable to obtain pt nutrition history. Pt with acute metabolic encephalopathy suspected due to sepsis and possible postictal state. Pt continues on NPO status. SLP to evaluate for swallow. RD to order nutritional supplements as appropriate once diet advances.  Unable to complete Nutrition-Focused physical exam at this time.   Labs and medications reviewed. Sodium elevated at 148.   Diet Order:   Diet Order            Diet NPO time specified  Diet effective now              EDUCATION NEEDS:   Not appropriate for education at this time  Skin:  Skin Assessment: Reviewed RN Assessment  Last BM:  Unknown  Height:   Ht Readings from Last 1 Encounters:  12/16/19 5\' 9"  (1.753 m)    Weight:   Wt Readings from Last 1 Encounters:  12/17/19 62.2 kg    Ideal Body Weight:  72.7 kg  BMI:  Body mass index is 20.25 kg/m.  Estimated Nutritional Needs:   Kcal:  1850-2050  Protein:  85-100  grams  Fluid:  >/= 1.8 L/day    Ronald Parker, MS, RD, LDN Pager # [phone] After hours/ weekend pager # [phone]

## 2019-12-18 NOTE — Progress Notes (Signed)
NEUROLOGY PROGRESS NOTE  Subjective: Patient alert, no locality, tracks me in the room with eyes.  He also developed a fever overnight and temperature was 102.5.  Exam: Vitals:   12/18/19 0915 12/18/19 0930  BP: (!) 169/98 (!) 165/97  Pulse: (!) 106 (!) 107  Resp: (!) 24 (!) 25  Temp: 98.5 F (36.9 C) 98.8 F (37.1 C)  SpO2: 95% 94%    ROS Unable to obtain due to mental poor status  Physical Exam  Constitutional: Cachectic Eyes: No scleral injection HENT: No OP obstrucion Head: Normocephalic.  Cardiovascular: Normal rate and regular rhythm.  Respiratory: Effort normal, non-labored breathing GI: Soft.  No distension.   Skin: WDI   Neuro:  MS: Alert not following commands but tracking me in the room Cranial nerves: Pupils equal and reactive, extraocular muscles intact, face symmetrical, Motor: Holding bilateral upper extremities antigravity.  Will be in both legs spontaneously Plantar: Flexion bilaterally   Medications:  Scheduled: .  stroke: mapping our early stages of recovery book   Does not apply Once  . aspirin  300 mg Rectal Daily  . atorvastatin  80 mg Oral q1800  . clopidogrel  75 mg Oral Daily  . enoxaparin (LOVENOX) injection  40 mg Subcutaneous Q24H  . folic acid  1 mg Oral Daily  . metoprolol tartrate  5 mg Intravenous Q6H  . multivitamin with minerals  1 tablet Oral Daily  . nicotine  21 mg Transdermal Daily  . thiamine  100 mg Oral Daily   Or  . thiamine  100 mg Intravenous Daily   Continuous: . sodium chloride 100 mL/hr at 12/18/19 0912  . levETIRAcetam 500 mg (12/18/19 0947)  . piperacillin-tazobactam (ZOSYN)  IV    . vancomycin    . [START ON 12/19/2019] vancomycin     [id number] **OR** acetaminophen (TYLENOL) oral liquid 160 mg/5 mL **OR** acetaminophen, labetalol, LORazepam **OR** LORazepam, senna-docusate  Pertinent Labs/Diagnostics: -Sodium 148 -White blood cell 17.4   MR Brain Wo Contrast (neuro protocol)  Result Date:  12/16/2019  IMPRESSION: Extensive old infarction in the left MCA territory. No sign of acute or subacute infarction. 2 small foci of restricted diffusion within the regions of old infarction in the left posterior frontal and parietal region not felt to relate to acute insult. Hemosiderin deposition in the regions of the old left MCA infarction. Electronically Signed   By: Nelson Chimes M.D.   On: 12/16/2019 17:53   CT Code Stroke Cerebral Perfusion with contrast  Result Date: 12/16/2019  IMPRESSION: No acute large vessel occlusion. Atherosclerotic disease at both carotid bifurcations. No significant stenosis. 30% stenosis at both vertebral artery origins. Severe atherosclerotic disease in both carotid siphon regions, worse on the left than the right. On the left, stenosis appears to be greater than 80%. On the right, stenosis is estimated at 50-70%. Atherosclerotic disease of both M1 segments. 50% stenosis on the right. Serial irregular 50-70% stenoses on the left. Atherosclerotic narrowing of both PCA branches as seen previously, with a severe stenosis of the P1 segment on the left. Electronically Signed   By: Nelson Chimes M.D.   On: 12/16/2019 15:57   EEG adult  Result Date: 12/17/2019  IMPRESSION: This study showed evidence of epileptogenicity and cortical dysfunction in left frontotemporal region likely secondary to underlying encephalomalacia.  Additionally there is evidence of mild to moderate diffuse encephalopathy, nonspecific etiology.  No seizures were seen throughout the recording. [name]   ECHOCARDIOGRAM COMPLETE  Result Date: 12/17/2019  ECHOCARDIOGRAM REPORT   Patient Name:   Ronald Wolf Date of Exam:  IMPRESSIONS  1. Left ventricular ejection fraction, by visual estimation, is 60 to 65%. The left ventricle has normal function. There is moderately increased left ventricular hypertrophy.  2. Left ventricular diastolic parameters are consistent with Grade I diastolic dysfunction  (impaired relaxation).  3. The left ventricle has no regional wall motion abnormalities.  4. Global right ventricle has normal systolic function.The right ventricular size is normal. No increase in right ventricular wall thickness.  5. Left atrial size was normal.  6. Right atrial size was normal.  7. The mitral valve is normal in structure. No evidence of mitral valve regurgitation.  8. The tricuspid valve is normal in structure.  9. The aortic valve is normal in structure. Aortic valve regurgitation is not visualized. 10. The pulmonic valve was normal in structure. Pulmonic valve regurgitation is not visualized. 11. Patient would not cooperate with completion of echo. 12. The atrial septum is grossly normal.CT HEAD CODE STROKE WO CONTRAST  Result Date: 12/16/2019 IMPRESSION: 1. Extensive old ischemic changes in the left MCA territory. No sign of acute infarction. Compared to the study of last January, findings are progressive particularly in the left frontal region, basal ganglia and external capsule region. 2. No sign of hemorrhage. 3. These results were communicated to Dr. Rory Percy at 3:29 pmon 1/3/2021by text page via the [facility] messaging system. Electronically Signed   By: Nelson Chimes M.D.   On: 12/16/2019 15:30   EEG-12/17/2019  Etta Quill PA-C [facility] [phone]   12/18/2019, 10:17 AM   NEUROHOSPITALIST ADDENDUM Performed a face to face diagnostic evaluation.   I have reviewed the contents of history and physical exam as documented by PA/ARNP/Resident and agree with above documentation.  I have discussed and formulated the above plan as documented. Edits to the note have been made as needed.   Assessment:  74 year-old male with past medical history of CVA (left MCA), hepatitis C, hypertension and seizure disorder presented to the emergency department with difficulty talking and worsening right-sided deficits followed by seizure characterized with facial twitching witnessed by  ER staff.  MRI brain was completed which showed extensive old left MCA infarct, no acute stroke.  Presentation was felt to be likely secondary to seizure. Work-up for stroke including CT angiogram of the neck: Noted to have a left carotid stenosis of the 80% and right carotid stenosis at 50 to 70%.  CT angiogram of the head showed intracranial atherosclerotic disease with bilateral M1 segment stenosis and PCA stenosis with significant P1 segment stenosis  Patient has not had any further clinical seizures since admission.  Routine EEG performed yesterday showed cortical dysfunction in the left frontotemporal region, likely due to underlying encephalomalacia.  On examination patient remains significantly altered today, nonverbal and not following any commands.  This decline from yesterday, where a least he would follow simple commands such as raising his arm.  He does track examiner across the room and is moving all extremities.  Patient developed a fever, has now been started on broad-spectrum antibiotics. Due to worsening confusion repeat routine EEG was performed, continues to show left frontotemporal dysfunction with no electrographic seizures.   Impression: --Acute metabolic encephalopathy in the setting of sepsis --Seizures -Chronic left MCA stroke -Intracranial atherosclerotic disease -Bilateral carotid stenosis   Recommendations: -Primary team to continue treating underlying sepsis, avoid cefepime if possible -Routine EEG this a.m. repeated: Negative for ongoing seizures, redemonstrates frontotemporal dysfunction similar to  yesterday's EEG likely due to underlying encephalomalacia from prior stroke. -Continue Keppra 500 mg twice daily -Continue aspirin and Plavix, high-dose statin for intracranial and extracranial atherosclerotic disease -We will defer evaluation for carotid endarterectomy as the MRI was negative for acute infarcts, outpatient stroke neurology and vascular surgery  follow-up -PT/OT and speech therapy -Seizure precautions     Donell Sliwinski MD [facility] [phone]   If 7pm to 7am, please call on call as listed on AMION.

## 2019-12-18 NOTE — Significant Event (Signed)
Rapid Response Event Note  Overview: Code Sepsis Initiation  Initial Focused Assessment: Nurse called to inform that Code Sepsis Bundle was started for the patient. Per nurse, patient was already receiving Vancomycin and Zosyn. Upon arrival, patient was alert, he could track me around the room, nonverbal, and not able to follow commands. HR - 80s SR, SBP 170s, 97% on RA, RR 20-24. Patient's hands and feet were quite cold to touch - concern for cold sepsis. Temperature checked rectally -- 100. 5 and patient had mild rigors/chills. Lung sounds -- clear in the upper fields - diminished in the lower. + Cough. + Urostomy  Interventions: -- No RRT Interventions   Plan of Care: -- RN started fluid boluses as ordered -- Continue to follow orders per sepsis orders and MD -- Please check rectal temperature - given patient's overall condition and age -- Monitor VS and recheck as needed  -- Monitor for signs of worsening sepsis or shock -- Strict I/Os   Event Summary:  Start Time 1630 End Time 1710   Ronald Wolf, [place]

## 2019-12-18 NOTE — Procedures (Signed)
Patient Name: Ronald Wolf  MRN: [id number]  Epilepsy Attending: [name]  Referring Physician/Provider: Etta Quill, PA Date: 12/18/2019 Duration: 24.31 minutes   Patient history: 74 year old male with left MCA infarct presented with seizure-like episode, now with worsening ams.  EEG to evaluate for seizures.   Level of alertness: Lethargic   AEDs during EEG study: Keppra   Technical aspects: This EEG study was done with scalp electrodes positioned according to the 10-20 International system of electrode placement. Electrical activity was acquired at a sampling rate of 500Hz  and reviewed with a high frequency filter of 70Hz  and a low frequency filter of 1Hz . EEG data were recorded continuously and digitally stored.    Description: During awake state, no clear posterior dominant rhythm was seen.  EEG showed continuous generalized, maximal left frontotemporal 3 to 5 Hz slowing.  Sharp waves were also seen in left frontotemporal region.  Hyperventilation and photic stimulation were not performed.   Abnormality -Continuous slow, generalized, maximal left frontotemporal -Sharp waves, left frontotemporal   IMPRESSION: This study showed evidence of epileptogenicity and cortical dysfunction in left frontotemporal region likely secondary to underlying encephalomalacia.  Additionally there is evidence of mild to moderate diffuse encephalopathy, nonspecific etiology.  No seizures were seen throughout the recording.  EEG is similar to previous study performed yesterday.   Ketzia Guzek [name]

## 2019-12-18 NOTE — Progress Notes (Signed)
BP 182/90, taken manually. Temp 102.4, taken rectally. Dr. Algis Liming notified. [place]

## 2019-12-18 NOTE — Significant Event (Signed)
Rapid Response Event Note  Overview: MEWS  Received a call at 0833 from nurse to report MEWS score of 5 - per nurse patient had a temperature and hypertension. Nurse treated both with PRN medications and MD was notified. I instructed the nurse to continue checking VS per MEWS protocol and externally cool the patient with ice packs as well to help with fever   I called back at 1003 to get an update from the nurse, she informed that BP and Temperature had improved but now he is febrile and hypertensive. I asked that she obtain a manual BP and rectal temperature and notify the MD on call.   Quadarius Henton R

## 2019-12-18 NOTE — Progress Notes (Signed)
Pharmacy Antibiotic Note  Ronald Wolf is a 74 y.o. male admitted on 12/16/2019 with AMS after seizures and was started on Rocephin for possible UTI. The patient is with continued fevers and worsening WBC on Rocephin. Pharmacy has been consulted to broaden to Vancomycin + Zosyn.   Plan: - Start Vancomycin 1250 mg IV every 24 hours - Start Zosyn 3.375g IV every 8 hours - Will continue to follow renal function, culture results, LOT, and antibiotic de-escalation plans    Height: 5\' 9"  (175.3 cm) Weight: 137 lb 2 oz (62.2 kg) IBW/kg (Calculated) : 70.7  Temp (24hrs), Avg:100.2 F (37.9 C), Min:97.4 F (36.3 C), Max:102.5 F (39.2 C)  Recent Labs  Lab 12/16/19 1515 12/16/19 1535 12/18/19 0331  WBC 11.1*  --  17.4*  CREATININE 1.14 1.10 1.03    Estimated Creatinine Clearance: 56.2 mL/min (by C-G formula based on SCr of 1.03 mg/dL).    Allergies  Allergen Reactions  . Iohexol Hives     Desc: pt broke out in hives years ago from iv contrast. he was given 50mg  benadryl po, 1 hr prior to ct and did fine today w/o complications.  JB     Antimicrobials this admission: CTX 1/4 >> 1/5 Vanc 1/5 >> Zosyn 1/5 >>  Dose adjustments this admission: n/a  Microbiology results: 1/3 COVID >> neg 1/4 UCx >> 1/4 BCx >> 1/4 MRSA PCR >>  Thank you for allowing pharmacy to be a part of this patient's care.  Alycia Rossetti, PharmD, BCPS Clinical Pharmacist Clinical phone for 12/18/2019: [phone] 12/18/2019 9:01 AM   **Pharmacist phone directory can now be found on [email or web address] ([email or web address]).  Listed under [place].

## 2019-12-18 NOTE — Progress Notes (Signed)
PT Cancellation Note  Patient Details Name: Ronald Wolf MRN: [id number] DOB: 1946/05/20   Cancelled Treatment:    Reason Eval/Treat Not Completed: Patient not medically ready. Per RN pt with temp of 102, high BP, and RR in 20s with MEWS of 5. Pt also with no command follow and very lethargic. Pt not appropriate for PT eval at this time. PT to return as able, as appropriate to complete PT eval.  Kittie Plater, PT, DPT Acute Rehabilitation Services Pager #: [phone] Office #: [phone]    [place] Lopes 12/18/2019, 10:37 AM

## 2019-12-18 NOTE — Progress Notes (Signed)
Administered PRN Labetalol 10mg  for BP 197/95. Additionally, administered 650mg  Tylenol suppository for temp 100.4. Will continue to monitor pt.

## 2019-12-18 NOTE — Progress Notes (Addendum)
SLP Cancellation Note  Patient Details Name: Ronald Wolf MRN: [id number] DOB: June 28, 1946   Cancelled treatment:       Reason Eval/Treat Not Completed: Patient at procedure or test/unavailable. Orders acknowledged, patient getting EKG.  Per nurse, patient also with fever. Will re-attempt later in day.  Addendum: Re-attemped 4:30pm. Code septic just called on patient, therefore pt not appropriate for eval at this time.   Marina Goodell, M.Ed., CCC-SLP Speech Therapy  12/18/2019, 11:35 AM

## 2019-12-18 NOTE — Progress Notes (Addendum)
Patient BP 182/90, PRN Labetalol given. Pt has developed a congested cough. Lungs sound diminished. Dr. Algis Liming notified. Orders to stop second bolus. Keep patient KVO.  [place]

## 2019-12-18 NOTE — Progress Notes (Signed)
OT Cancellation Note  Patient Details Name: Ronald Wolf MRN: [id number] DOB: 1946/11/05   Cancelled Treatment:    Reason Eval/Treat Not Completed: Medical issues which prohibited therapy Per RN, pt with 102 temp, high BP and RR in 20s. Pt not appropriate for OT eval at this time, will continue to follow and return as time allows and pt is appropriate.   Dorinda Hill OTR/L Acute Rehabilitation Services Office: [place] 12/18/2019, 10:56 AM

## 2019-12-18 NOTE — Progress Notes (Signed)
EEG completed, results pending Notified Neuro for STAT read

## 2019-12-19 DIAGNOSIS — G9341 Metabolic encephalopathy: Secondary | ICD-10-CM

## 2019-12-19 LAB — BASIC METABOLIC PANEL
Anion gap: 13 (ref 5–15)
BUN: 21 mg/dL (ref 8–23)
CO2: 19 mmol/L — ABNORMAL LOW (ref 22–32)
Calcium: 8.6 mg/dL — ABNORMAL LOW (ref 8.9–10.3)
Chloride: 115 mmol/L — ABNORMAL HIGH (ref 98–111)
Creatinine, Ser: 1.1 mg/dL (ref 0.61–1.24)
GFR calc Af Amer: >60 mL/min (ref >60)
GFR calc non Af Amer: >60 mL/min (ref >60)
Glucose, Bld: 80 mg/dL (ref 70–99)
Potassium: 2.7 mmol/L — CL (ref 3.5–5.1)
Sodium: 147 mmol/L — ABNORMAL HIGH (ref 135–145)

## 2019-12-19 LAB — CBC WITH DIFFERENTIAL/PLATELET
Abs Immature Granulocytes: 0.08 10*3/uL — ABNORMAL HIGH (ref 0.00–0.07)
Basophils Absolute: 0 10*3/uL (ref 0.0–0.1)
Basophils Relative: 0 %
Eosinophils Absolute: 0 10*3/uL (ref 0.0–0.5)
Eosinophils Relative: 0 %
HCT: 42.5 % (ref 39.0–52.0)
Hemoglobin: 13.6 g/dL (ref 13.0–17.0)
Immature Granulocytes: 1 %
Lymphocytes Relative: 15 %
Lymphs Abs: 2.4 10*3/uL (ref 0.7–4.0)
MCH: 34.8 pg — ABNORMAL HIGH (ref 26.0–34.0)
MCHC: 32 g/dL (ref 30.0–36.0)
MCV: 108.7 fL — ABNORMAL HIGH (ref 80.0–100.0)
Monocytes Absolute: 1.9 10*3/uL — ABNORMAL HIGH (ref 0.1–1.0)
Monocytes Relative: 12 %
Neutro Abs: 11.8 10*3/uL — ABNORMAL HIGH (ref 1.7–7.7)
Neutrophils Relative %: 72 %
Platelets: 219 10*3/uL (ref 150–400)
RBC: 3.91 MIL/uL — ABNORMAL LOW (ref 4.22–5.81)
RDW: 12.5 % (ref 11.5–15.5)
WBC: 16.2 10*3/uL — ABNORMAL HIGH (ref 4.0–10.5)
nRBC: 0 % (ref 0.0–0.2)

## 2019-12-19 LAB — MAGNESIUM: Magnesium: 1.8 mg/dL (ref 1.7–2.4)

## 2019-12-19 LAB — URINE CULTURE: Culture: NO GROWTH

## 2019-12-19 LAB — LACTIC ACID, PLASMA: Lactic Acid, Venous: 1.7 mmol/L (ref 0.5–1.9)

## 2019-12-19 MED ORDER — DEXTROSE 5 % IV SOLN: INTRAVENOUS | Status: AC

## 2019-12-19 MED ORDER — POTASSIUM CHLORIDE CRYS ER 20 MEQ PO TBCR
40.0000 meq | EXTENDED_RELEASE_TABLET | Freq: Once | ORAL | Status: DC
  Filled 2019-12-19: qty 2

## 2019-12-19 MED ORDER — POTASSIUM CHLORIDE 10 MEQ/100ML IV SOLN
10.0000 meq | INTRAVENOUS | Status: AC
  Administered 2019-12-19 (×4): 10 meq via INTRAVENOUS
  Filled 2019-12-19 (×4): qty 100

## 2019-12-19 MED ORDER — METOPROLOL TARTRATE 25 MG PO TABS
25.0000 mg | ORAL_TABLET | Freq: Two times a day (BID) | ORAL | Status: DC
  Administered 2019-12-19 – 2019-12-20 (×2): 25 mg via ORAL
  Filled 2019-12-19 (×2): qty 1

## 2019-12-19 MED ORDER — AMLODIPINE BESYLATE 10 MG PO TABS
10.0000 mg | ORAL_TABLET | Freq: Every day | ORAL | Status: DC
  Administered 2019-12-19 – 2020-01-01 (×14): 10 mg via ORAL
  Filled 2019-12-19 (×14): qty 1

## 2019-12-19 NOTE — Progress Notes (Signed)
NEUROLOGY PROGRESS NOTE  Subjective: Patient is more alert today.  When asking him to follow commands he states "go get him get him".  Exam: Vitals:   12/19/19 0655 12/19/19 0720  BP:  (!) 169/84  Pulse:  70  Resp: 20 15  Temp:  (!) 97.4 F (36.3 C)  SpO2: 98%     ROS -Unable to obtain secondary to poor mental status    Physical Exam  Constitutional: Cachectic Psych: Affect appropriate to situation Eyes: No scleral injection HENT: No OP obstrucion Head: Normocephalic.  Cardiovascular: Normal rate and regular rhythm.  Respiratory: Effort normal, non-labored breathing GI: Soft.  No distension. There is no tenderness.  Skin: WDI   Neuro:  Mental Status: Patient easily woken from sleep.  When asked to follow commands as above states "go get them get him ". Cranial Nerves: Pupils equal round and reactive, extraocular muscle intact, tracks my movements in the room, face symmetric Motor: Moving all extremities with right upper extremity weaker than left.  Moving all extremities spontaneously Plantar: Flexion bilaterally     Medications:  Scheduled: . aspirin  300 mg Rectal Daily  . atorvastatin  80 mg Oral q1800  . clopidogrel  75 mg Oral Daily  . enoxaparin (LOVENOX) injection  40 mg Subcutaneous Q24H  . folic acid  1 mg Oral Daily  . metoprolol tartrate  5 mg Intravenous Q6H  . multivitamin with minerals  1 tablet Oral Daily  . nicotine  21 mg Transdermal Daily  . potassium chloride  40 mEq Oral Once  . thiamine  100 mg Oral Daily   Or  . thiamine  100 mg Intravenous Daily   Continuous: . levETIRAcetam 500 mg (12/18/19 2154)  . piperacillin-tazobactam (ZOSYN)  IV 3.375 g (12/19/19 0230)  . vancomycin      Pertinent Labs/Diagnostics: -Sodium 147 -Potassium 2.7 -WBC 16.2  EEG Result Date: 12/18/2019   IMPRESSION: This study showed evidence of epileptogenicity and cortical dysfunction in left frontotemporal region likely secondary to underlying  encephalomalacia.  Additionally there is evidence of mild to moderate diffuse encephalopathy, nonspecific etiology.  No seizures were seen throughout the recording. EEG is similar to previous study performed yesterday.  Priyanka Cipriano Mile PA-C [facility] [phone]  Assessment:  74 year old male with past medical history of CVA-left MCA, hepatitis C, hypertension and seizure disorder who presented to the emergency department with difficulty talking and worsening right-sided deficits followed by seizure characterized by facial twitching witnessed by ER staff.  MRI was obtained and did not show acute infarct. patient's exam yesterday was slightly different than the day before.  Repeat EEG did not show any epileptiform activity.  CT angiogram of the neck was noted for left carotid stenosis of 80% and right carotid stenosis at 50 to 70%.  CT angiogram of head showed intracranial atherosclerotic disease with bilateral M1 segment stenosis and PCA stenosis with significant P1 segment stenosis.  Patient has had no further seizures since in hospital.  Both initial and repeat EEG did not show any epileptiform activity.  Today's exam was more characteristic of exam on 12/17/2019 as noted above.  Encephalopathy likely secondary to sepsis   Impression: -Acute metabolic encephalopathy in the setting of sepsis -Seizure -Chronic left MCA stroke -Intracranial atherosclerotic disease -Bilateral carotid stenosis  Recommendations: -Primary team to continue treating underlying sepsis, avoid cefepime if possible -Continue Keppra 500 mg twice daily -Continue aspirin and Plavix, high-dose statin for intracranial and extracranial atherosclerotic disease -We will defer  evaluation for carotid endarterectomy as the MRI was negative for acute infarcts, outpatient stroke neurology and vascular surgery follow-up -PT/OT and speech therapy -Seizure precautions   12/19/2019, 9:12  AM  NEUROHOSPITALIST ADDENDUM Performed a face to face diagnostic evaluation.   I have reviewed the contents of history and physical exam as documented by PA/ARNP/Resident and agree with above documentation.  I have discussed and formulated the above plan as documented. Edits to the note have been made as needed.   Patient is awake and attempts to state his name.  Suspect encephalopathy in the setting of sepsis with recrudescence of his old stroke symptoms.  Neurology will follow along however suspect improvement after correction sepsis.    Karena Addison Kanyon Bunn MD [facility] [phone]   If 7pm to 7am, please call on call as listed on AMION.

## 2019-12-19 NOTE — Plan of Care (Signed)
SLP to address dysphagia goals.

## 2019-12-19 NOTE — Evaluation (Addendum)
Clinical/Bedside Swallow Evaluation Patient Details  Name: CHRISTA MUNSHI MRN: [id number] Date of Birth: 1946/10/17  Today's Date: 12/19/2019 Time: SLP Start Time (ACUTE ONLY): 1026 SLP Stop Time (ACUTE ONLY): 1041 SLP Time Calculation (min) (ACUTE ONLY): 15 min  Past Medical History:  Past Medical History:  Diagnosis Date  . Abdominal pain, other specified site 09/26/2013  . Anxiety   . Arthritis   . Atherosclerosis of arteries 05/13/2013  . Cough 09/26/2013  . Depression   . Eating disorder   . GERD (gastroesophageal reflux disease)   . Hepatitis C   . Hypertension   . Lesion of liver 04/01/2013  . Loss of weight 06/21/2014  . Medial knee pain 09/26/2013  . Palpitations 06/21/2014  . Shortness of breath dyspnea    with exertion   Past Surgical History:  Past Surgical History:  Procedure Laterality Date  . IR ANGIO INTRA EXTRACRAN SEL COM CAROTID INNOMINATE BILAT MOD SED  09/09/2017  . IR ANGIO VERTEBRAL SEL VERTEBRAL BILAT MOD SED  09/09/2017  . IR RADIOLOGIST EVAL & MGMT  09/07/2017  . MICROLARYNGOSCOPY WITH CO2 LASER AND EXCISION OF VOCAL CORD LESION N/A 05/14/2016   Procedure: MICROLARYNGOSCOPY WITH CO2 LASER AND EXCISION OF VOCAL CORD LESION;  Surgeon: Melida Quitter, MD;  Location: [place];  Service: ENT;  Laterality: N/A;  Suspended micro laryngoscopy with CO2 laser excision of vocal fold lesion  . 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  He reportedly had difficulty talking, worsening right-sided deficits followed by seizures characterized by facial twitching witnessed in the ER.  Acute stroke ruled out by MRI.  Admitted for suspected seizures, confirmed by EEG.   Neurology consulted.  Hospital course complicated by febrile illness, sepsis due to UTI versus other etiology. On 1/5, treated with aggressive IVF per sepsis protocol, developed congested cough and IV changed to NSL. Patient seen briefly by SLP a year ago (Jan 2020) for speech/language related to left MCA.    Assessment / Plan / Recommendation Clinical Impression   Pt seen for bedside swallowing evaluation. Pt alert, able to visually track. Pt unable to state name or answer basic yes/no questions, though verbosity was demonstrated. Oral mechanism exam limited by patient's inability to follow commands. Pt noted to be edentulous (no dentures present in room) with white coating on tongue (?oral thrush). No facial asymmetry noted.  Patient provided PO trials of thin liquids (water) via spoon sip, cup sip and straw sip. Pt with good labial seal, good oral acceptance and AP transfer appeared to be timely. Hyolaryngeal elevation/excurion present. Spontaneous double swallow noted in almost all sips. Belching x2. With ice chip trials, patient accepting bolus with limited oral manipulation (allowing ice chip to melt and then initiating the swallow). No wet voice quality of overt s/sx aspiration observed with thin liquid or ice chip trials. Pt provided spoon bites of puree (applesauce), good oral acceptance/labial control, mildly delayed oral transit and brief oral holding noted before the swallow.  Awareness of bolus appearing intermittently reduced (likely 2/2 confusion), but patient able to swallow bolus in all puree trials. Oral clearance appeared to be grossly adequate (though difficult to obtain full view of oral cavity d/t patient's inability to follow command to  open mouth). In one instance, puree trial followed by straw sip of thin liquid resulted in delayed cough. Unclear if cough related to PO. Recommend MBS (once patient is medically stable to participate) to objectively assess swallow function. Also consider  esophageal workup/GI referral.   At this time, recommend dysphagia 1 (puree) solids, thin liquids. Ensure patient is alert, sitting upright for PO intake, provide small bites and sips, alternate liquids and solids.  SLP to follow acutely.  Results d/w RN, she verbalized understanding.   SLP Visit Diagnosis: Dysphagia, oropharyngeal phase (R13.12)    Aspiration Risk  Mild aspiration risk    Diet Recommendation Thin liquid;Dysphagia 1 (Puree)   Liquid Administration via: Cup Medication Administration: Whole meds with liquid Supervision: Full supervision/cueing for compensatory strategies Compensations: Minimize environmental distractions;Slow rate;Small sips/bites;Follow solids with liquid Postural Changes: Seated upright at 90 degrees    Other  Recommendations Recommended Consults: Consider esophageal assessment Oral Care Recommendations: Oral care BID   Follow up Recommendations Other (comment)(tbd)      Frequency and Duration min 2x/week  3 weeks       Prognosis Prognosis for Safe Diet Advancement: Good Barriers to Reach Goals: Language deficits      Swallow Study   General HPI: 74 year old male with PMH of anxiety, depression, GERD, hepatitis C, hypertension, hyperlipidemia, trigeminal neuralgia s/p, knife treatment, CVA (left MCA), alcohol dependence in remission, vascular dementia, seizure disorder post stroke not on medications PTA, s/p urostomy, followed by family unresponsive at 1 PM on 1/3.  He reportedly had difficulty talking, worsening right-sided deficits followed by seizures characterized by facial twitching witnessed in the ER.  Acute stroke ruled out by MRI.  Admitted for suspected seizures, confirmed by EEG.  Neurology consulted.  Hospital course complicated by febrile illness, sepsis due to UTI versus other etiology. On 1/5, treated with aggressive IVF per sepsis protocol, developed congested cough and IV changed to NSL. Patient seen briefly by SLP a year ago  (Jan 2020) for speech/language related to left MCA.  Type of Study: Bedside Swallow Evaluation Diet Prior to this Study: Information not available Temperature Spikes Noted: No Respiratory Status: Room air Behavior/Cognition: Pleasant mood;Doesn't follow directions;Confused Oral Cavity Assessment: Other (comment)(white coating on tongue (thrush?)) Oral Care Completed by SLP: Yes Oral Cavity - Dentition: Edentulous(unknown if he has dentures) Vision: Functional for self-feeding Self-Feeding Abilities: Needs assist Patient Positioning: Upright in bed Baseline Vocal Quality: Normal Volitional Cough: Cognitively unable to elicit Volitional Swallow: Able to elicit    Oral/Motor/Sensory Function Overall Oral Motor/Sensory Function: Other (comment)   Ice Chips Ice chips: Within functional limits Presentation: Spoon   Thin Liquid Thin Liquid: Within functional limits Presentation: Straw;Cup;Spoon    Nectar Thick Nectar Thick Liquid: Not tested   Honey Thick Honey Thick Liquid: Not tested   Puree Puree: Impaired Presentation: Spoon Oral Phase Impairments: Poor awareness of bolus Oral Phase Functional Implications: Prolonged oral transit;Oral holding Pharyngeal Phase Impairments: Multiple swallows;Cough - Delayed   Solid     Solid: Not tested      Marina Goodell, M.Ed., CCC-SLP Speech Therapy Acute Rehabilitation 12/19/2019,10:54 AM

## 2019-12-19 NOTE — Care Management Important Message (Signed)
Important Message  Patient Details  Name: Ronald Wolf MRN: [id number] Date of Birth: Dec 01, 1946   Medicare Important Message Given:  Yes     Orbie Pyo 12/19/2019, 2:04 PM

## 2019-12-19 NOTE — Progress Notes (Signed)
PROGRESS NOTE    Ronald Wolf   [id number]  DOB: February 26, 1946  DOA: 12/16/2019 PCP: Ronald Lukes, MD   Brief Narrative:  Ronald Wolf 74 year old male with PMH of anxiety, depression, GERD, hepatitis C, hypertension, hyperlipidemia, trigeminal neuralgia s/p, knife treatment, CVA (left MCA), alcohol dependence in remission, vascular dementia, seizure disorder post stroke not on medications PTA, s/p urostomy, found by family unresponsive at 1 PM on 1/3.  He reportedly had difficulty talking, worsening right-sided deficits followed by seizures characterized by facial twitching witnessed in the ER.  Acute stroke ruled out by MRI.  Admitted for suspected seizures, confirmed by EEG.  Neurology consulted.  Hospital course complicated by febrile illness, sepsis due to UTI versus other etiology. On 1/5, treated with aggressive IVF per sepsis protocol, developed congested cough and IV changed to NSL.  Subjective: No complaints-appears confused  Assessment & Plan:   Principal Problem:   Acute metabolic encephalopathy -Thought to be related to sepsis/UTI and possibly a post ictal state -MRI on 12/16/2019 did not show an acute CVA -He continues to be confused today, not following commands and not able to tell me his name -He is undergone 2 EEGs and no epileptiform activity has been noted -He is being continued on Keppra 500 mg twice daily by neurology -He does have a history of alcohol use and history obtained by my colleague mention that a bottle of brandy would  last 1 to 2 weeks  Active Problems: UTI in setting of urostomy with sepsis(POA) - fever-leukocytosis-lactic acidosis with acute metabolic encephalopathy -Patient continued to have fevers on ceftriaxone on 1/4 and therefore the antibiotics were switched to Zosyn and vancomycin on 1/5 -Urine culture unfortunately was contaminated-repeat culture was ordered on 1/5 -MRSA PCR negative -Blood cultures x2 sets negative - CXR 1/5 negative- -  temp ~ 99 today- might consider repeating CXR tomorrow to r/o pneumonia  Hypernatremia -IV fluids discontinued yesterday due to "gurgling" -Start D5 water today and follow  Thrush - start Diflucan  Hypokalemia -Potassium 2.7 today with normal magnesium level - replace by IV and oral route  ?  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 -MRI 12/16/2019> Extensive old infarction in the left MCA territory -Continue new aspirin, Plavix and statin  Bilateral carotid artery stenosis -12/16/19 CTA showed left carotid artery stenosis of about 80% and right carotid artery stenosis of about 50-70%  Tobacco abuse -Continue nicotine patch  Reported polysubstance abuse -UDS negative   Time spent in minutes: 35 DVT prophylaxis: Lovenox Code Status: Full code Family Communication:  Disposition Plan: cont to follow confusion Consultants:   neuro Procedures:   EEG x 2 Antimicrobials:  Anti-infectives (From admission, onward)   Start     Dose/Rate Route Frequency Ordered Stop   12/19/19 1000  vancomycin (VANCOREADY) IVPB 1250 mg/250 mL     1,250 mg 166.7 mL/hr over 90 Minutes Intravenous Every 24 hours 12/18/19 0914     12/18/19 1000  piperacillin-tazobactam (ZOSYN) IVPB 3.375 g     3.375 g 12.5 mL/hr over 240 Minutes Intravenous Every 8 hours 12/18/19 0903     12/18/19 0930  vancomycin (VANCOCIN) IVPB 1000 mg/200 mL premix  Status:  Discontinued  1,000 mg 200 mL/hr over 60 Minutes Intravenous  Once 12/18/19 0903 12/18/19 0906   12/18/19 0930  vancomycin (VANCOREADY) IVPB 1250 mg/250 mL     1,250 mg 166.7 mL/hr over 90 Minutes Intravenous  Once 12/18/19 0906 12/18/19 1205   12/17/19 1345   cefTRIAXone (ROCEPHIN) 1 g in sodium chloride 0.9 % 100 mL IVPB  Status:  Discontinued     1 g 200 mL/hr over 30 Minutes Intravenous Every 24 hours 12/17/19 1332 12/18/19 0838       Objective: Vitals:   12/19/19 0655 12/19/19 0720 12/19/19 1000 12/19/19 1120  BP:  (!) 169/84 (!) 176/101 (!) 178/97  Pulse:  70  81  Resp: 20 15  16   Temp:  (!) 97.4 F (36.3 C)  98.5 F (36.9 C)  TempSrc:  Oral  Oral  SpO2: 98%     Weight:      Height:        Intake/Output Summary (Last 24 hours) at 12/19/2019 1501 Last data filed at 12/19/2019 1320 Gross per 24 hour  Intake 4147.93 ml  Output 1350 ml  Net 2797.93 ml   Filed Weights   12/16/19 1559 12/17/19 1950  Weight: 70.6 kg 62.2 kg    Examination: General exam: Appears comfortable  HEENT: PERRLA, oral mucosa moist, no sclera icterus or thrush Respiratory system: Clear to auscultation. Respiratory effort normal. Cardiovascular system: S1 & S2 heard, RRR.   Gastrointestinal system: Abdomen soft, non-tender, nondistended. Normal bowel sounds. Central nervous system: Alert - severely disoriented does not follow commands. moves extremities Extremities: No cyanosis, clubbing or edema Skin: No rashes or ulcers      Data Reviewed: I have personally reviewed following labs and imaging studies  CBC: Recent Labs  Lab 12/16/19 1515 12/16/19 1535 12/18/19 0331 12/19/19 0224  WBC 11.1*  --  17.4* 16.2*  NEUTROABS 5.9  --   --  11.8*  HGB 13.8 14.6 13.5 13.6  HCT 43.4 43.0 42.5 42.5  MCV 110.7*  --  109.8* 108.7*  PLT 286  --  254 [id number]   Basic Metabolic Panel: Recent Labs  Lab 12/16/19 1515 12/16/19 1535 12/18/19 0331 12/19/19 0224  NA 145 145 148* 147*  K 3.6 3.4* 3.6 2.7*  CL 111 110 110 115*  CO2 24  --  24 19*  GLUCOSE 131* 121* 101* 80  BUN 16 18 15 21   CREATININE 1.14 1.10 1.03 1.10  CALCIUM 9.5  --  9.3 8.6*  MG  --   --   --  1.8   GFR: Estimated Creatinine Clearance: 52.6 mL/min (by C-G formula based on SCr of  1.1 mg/dL). Liver Function Tests: Recent Labs  Lab 12/16/19 1515  AST 36  ALT 40  ALKPHOS 100  BILITOT 0.9  PROT 7.5  ALBUMIN 3.7   No results for input(s): LIPASE, AMYLASE in the last 168 hours. Recent Labs  Lab 12/16/19 1656  AMMONIA 52*   Coagulation Profile: Recent Labs  Lab 12/16/19 1515  INR 1.0   Cardiac Enzymes: No results for input(s): CKTOTAL, CKMB, CKMBINDEX, TROPONINI in the last 168 hours. BNP (last 3 results) No results for input(s): PROBNP in the last 8760 hours. HbA1C: Recent Labs    12/17/19 0819  HGBA1C 4.5*   CBG: Recent Labs  Lab 12/16/19 1519  GLUCAP 117*   Lipid Profile: Recent Labs    12/17/19 0819  CHOL 143  HDL 48  LDLCALC 78  TRIG 85  CHOLHDL 3.0   Thyroid Function Tests:  Recent Labs    12/16/19 2013  TSH 1.400   Anemia Panel: No results for input(s): VITAMINB12, FOLATE, FERRITIN, TIBC, IRON, RETICCTPCT in the last 72 hours. Urine analysis:    Component Value Date/Time   COLORURINE AMBER (A) 12/17/2018 2159   APPEARANCEUR CLOUDY (A) 12/17/2018 2159   LABSPEC 1.012 12/17/2018 2159   PHURINE 9.0 (H) 12/17/2018 2159   GLUCOSEU NEGATIVE 12/17/2018 2159   GLUCOSEU NEGATIVE 08/21/2015 1412   HGBUR NEGATIVE 12/17/2018 2159   BILIRUBINUR NEGATIVE 12/17/2018 2159   BILIRUBINUR negative 12/20/2017 1846   KETONESUR NEGATIVE 12/17/2018 2159   PROTEINUR 100 (A) 12/17/2018 2159   UROBILINOGEN 0.2 12/20/2017 1846   UROBILINOGEN 0.2 08/21/2015 1412   NITRITE NEGATIVE 12/17/2018 2159   LEUKOCYTESUR TRACE (A) 12/17/2018 2159   Sepsis Labs: @LABRCNTIP (procalcitonin:4,lacticidven:4) ) Recent Results (from the past 240 hour(s))  SARS CORONAVIRUS 2 (TAT 6-24 HRS) Nasopharyngeal Nasopharyngeal Swab     Status: None   Collection Time: 12/16/19  3:55 PM   Specimen: Nasopharyngeal Swab  Result Value Ref Range Status   SARS Coronavirus 2 NEGATIVE NEGATIVE Final    Comment: (NOTE) SARS-CoV-2 target nucleic acids are NOT  DETECTED. The SARS-CoV-2 RNA is generally detectable in upper and lower respiratory specimens during the acute phase of infection. Negative results do not preclude SARS-CoV-2 infection, do not rule out co-infections with other pathogens, and should not be used as the sole basis for treatment or other patient management decisions. Negative results must be combined with clinical observations, patient history, and epidemiological information. The expected result is Negative. Fact Sheet for Patients: SugarRoll.be Fact Sheet for Healthcare Providers: https://www.woods-mathews.com/ This test is not yet approved or cleared by the [place] FDA and  has been authorized for detection and/or diagnosis of SARS-CoV-2 by FDA under an Emergency Use Authorization (EUA). This EUA will remain  in effect (meaning this test can be used) for the duration of the COVID-19 declaration under Section 56 4(b)(1) of the Act, 21 U.S.C. section 360bbb-3(b)(1), unless the authorization is terminated or revoked sooner. Performed at [facility], [place] [address], [place], [place] [address]   Culture, Urine     Status: Abnormal   Collection Time: 12/17/19  3:43 PM   Specimen: Urine, Random  Result Value Ref Range Status   Specimen Description URINE, RANDOM  Final   Special Requests   Final    NONE Performed at [facility], [place] [address], [place], [place] [address]    Culture MULTIPLE SPECIES PRESENT, SUGGEST RECOLLECTION (A)  Final   Report Status 12/18/2019 FINAL  Final  Culture, blood (routine x 2)     Status: None (Preliminary result)   Collection Time: 12/17/19  8:58 PM   Specimen: BLOOD  Result Value Ref Range Status   Specimen Description BLOOD LEFT ANTECUBITAL  Final   Special Requests   Final    BOTTLES DRAWN AEROBIC ONLY Blood Culture results may not be optimal due to an inadequate volume of blood received in culture bottles   Culture   Final     NO GROWTH 2 DAYS Performed at [facility], [place] [address], [place], [place] [address]    Report Status PENDING  Incomplete  Culture, blood (routine x 2)     Status: None (Preliminary result)   Collection Time: 12/17/19  9:17 PM   Specimen: BLOOD LEFT HAND  Result Value Ref Range Status   Specimen Description BLOOD LEFT HAND  Final   Special Requests  Final    BOTTLES DRAWN AEROBIC ONLY Blood Culture results may not be optimal due to an inadequate volume of blood received in culture bottles   Culture   Final    NO GROWTH 2 DAYS Performed at [facility], [place] [address], [place], [place] [address]    Report Status PENDING  Incomplete  MRSA PCR Screening     Status: None   Collection Time: 12/18/19  8:38 AM   Specimen: Nasopharyngeal  Result Value Ref Range Status   MRSA by PCR NEGATIVE NEGATIVE Final    Comment:        The GeneXpert MRSA Assay (FDA approved for NASAL specimens only), is one component of a comprehensive MRSA colonization surveillance program. It is not intended to diagnose MRSA infection nor to guide or monitor treatment for MRSA infections. Performed at [facility], [place] [address] [place], [place] [address]   Culture, Urine     Status: None   Collection Time: 12/18/19  4:48 PM   Specimen: Urine, Clean Catch  Result Value Ref Range Status   Specimen Description URINE, CLEAN CATCH  Final   Special Requests NONE  Final   Culture   Final    NO GROWTH Performed at [facility], [place] [address], [place], [place] [address]    Report Status 12/19/2019 FINAL  Final         Radiology Studies: EEG  Result Date: 12/18/2019 [name], MD     12/18/2019 12:51 PM Patient Name: BREWER LESHKO MRN: [id number] Epilepsy Attending: [name] Referring Physician/Provider: Etta Quill, PA Date: 12/18/2019 Duration: 24.31 minutes  Patient history: 74 year old male with left MCA infarct presented with seizure-like episode, now with  worsening ams.  EEG to evaluate for seizures.  Level of alertness: Lethargic  AEDs during EEG study: Keppra  Technical aspects: This EEG study was done with scalp electrodes positioned according to the 10-20 International system of electrode placement. Electrical activity was acquired at a sampling rate of 500Hz  and reviewed with a high frequency filter of 70Hz  and a low frequency filter of 1Hz . EEG data were recorded continuously and digitally stored.  Description: During awake state, no clear posterior dominant rhythm was seen.  EEG showed continuous generalized, maximal left frontotemporal 3 to 5 Hz slowing.  Sharp waves were also seen in left frontotemporal region.  Hyperventilation and photic stimulation were not performed.  Abnormality -Continuous slow, generalized, maximal left frontotemporal -Sharp waves, left frontotemporal  IMPRESSION: This study showed evidence of epileptogenicity and cortical dysfunction in left frontotemporal region likely secondary to underlying encephalomalacia.  Additionally there is evidence of mild to moderate diffuse encephalopathy, nonspecific etiology.  No seizures were seen throughout the recording. EEG is similar to previous study performed yesterday.  [name]   DG Chest Port 1 View  Result Date: 12/18/2019 CLINICAL DATA:  Fever EXAM: PORTABLE CHEST 1 VIEW COMPARISON:  12/17/2019 FINDINGS: The heart size and mediastinal contours are stable. No new focal airspace consolidation, pleural effusion, or pneumothorax. There is a 10 cm thin linear radiopaque structure projecting over the mid and right hemithorax, new from prior, and likely external to the patient. IMPRESSION: 1. No active cardiopulmonary disease. 2. 10 cm thin linear radiopaque structure projecting over the mid and right hemithorax, likely external to the patient. Electronically Signed   By: Davina Poke D.O.   On: 12/18/2019 09:16      Scheduled Meds: . aspirin  300 mg Rectal  Daily  .  atorvastatin  80 mg Oral q1800  . clopidogrel  75 mg Oral Daily  . enoxaparin (LOVENOX) injection  40 mg Subcutaneous Q24H  . folic acid  1 mg Oral Daily  . metoprolol tartrate  5 mg Intravenous Q6H  . multivitamin with minerals  1 tablet Oral Daily  . nicotine  21 mg Transdermal Daily  . potassium chloride  40 mEq Oral Once  . thiamine  100 mg Oral Daily   Or  . thiamine  100 mg Intravenous Daily   Continuous Infusions: . levETIRAcetam 500 mg (12/19/19 1319)  . piperacillin-tazobactam (ZOSYN)  IV 3.375 g (12/19/19 1019)  . vancomycin 1,250 mg (12/19/19 1035)     LOS: 3 days      Debbe Odea, MD [facility] Pager: [email or web address] Password [email or web address] 12/19/2019, 3:01 PM

## 2019-12-19 NOTE — Progress Notes (Signed)
Occupational Therapy Evaluation Patient Details Name: Ronald Wolf MRN: [id number] DOB: Dec 02, 1946 Today's Date: 12/19/2019    History of Present Illness 74 year old male with PMH of anxiety, depression, hepatitis C, hypertension, hyperlipidemia, trigeminal neuralgia s/p knife treatment, CVA (left MCA), alcohol dependence in remission, vascular dementia, seizure disorder post stroke not on medications PTA, s/p urostomy, found by family unresponsive at 1 PM on 1/3. Acute stroke ruled out by MRI.  Admitted for suspected seizures, confirmed by EEG. On 1/5, treated with aggressive IVF per sepsis protocol, developed congested cough and IV changed to NSL.   Clinical Impression   Pt with impaired communication, unable to provide prior level of functioning and home setup information. Based on pt's previous admission 12/2018 pt was requiring minA for static balance at RW and required cues for safety due to R visual field deficit and required multimodal cues for ADL due to cognitive limitations. Pt currently requires maxA+2 for bed mobility, maxA+2 for sit<>stand and was unable to progress into full upright standing position. Pt requires totalA for all ADL, when provided with toothbrush, pt attempted to brush his mustache, he required hand over hand assistance to wash face. Pt demonstrates cognitive limitations and physical limitations impacting his safety and independence with ADL and functional mobility. Due to decline in current level of function, pt would benefit from acute OT to address established goals to facilitate safe D/C to venue listed below. At this time, recommend CIR follow-up. Will continue to follow acutely.     Follow Up Recommendations  CIR    Equipment Recommendations  3 in 1 bedside commode    Recommendations for Other Services       Precautions / Restrictions Precautions Precautions: Fall Precaution Comments: seizure Restrictions Weight Bearing Restrictions: No      Mobility  Bed Mobility Overal bed mobility: Needs Assistance Bed Mobility: Supine to Sit;Sit to Supine     Supine to sit: Max assist;+2 for physical assistance;+2 for safety/equipment Sit to supine: +2 for physical assistance;+2 for safety/equipment;Max assist   General bed mobility comments: maxA for all aspects, pt initiated pushing trunk to upright posture  Transfers Overall transfer level: Needs assistance Equipment used: None Transfers: Sit to/from Stand Sit to Stand: Max assist;+2 physical assistance;+2 safety/equipment         General transfer comment: pt required maxA to powerup into standing, unable to reach fully upright posture    Balance Overall balance assessment: Needs assistance Sitting-balance support: Bilateral upper extremity supported;Feet supported Sitting balance-Leahy Scale: Poor Sitting balance - Comments: reliant on external assistance Postural control: Right lateral lean Standing balance support: Bilateral upper extremity supported Standing balance-Leahy Scale: Zero Standing balance comment: unable to progress to full upright posture                           ADL either performed or assessed with clinical judgement   ADL Overall ADL's : Needs assistance/impaired     Grooming: Total assistance Grooming Details (indicate cue type and reason): provided washcloth, required hand over hand assistance to initiate and continue washing face                               General ADL Comments: totalA for all ADL;limited by impaired communication and cognitive limitations;     Vision   Vision Assessment?: Vision impaired- to be further tested in functional context Additional Comments: tracks therapist to the left,  does not appear to track on his right side of environment     Perception     Praxis      Pertinent Vitals/Pain Pain Assessment: Faces Faces Pain Scale: No hurt Pain Intervention(s): Monitored during session     Hand  Dominance Right   Extremity/Trunk Assessment Upper Extremity Assessment Upper Extremity Assessment: Difficult to assess due to impaired cognition   Lower Extremity Assessment Lower Extremity Assessment: Difficult to assess due to impaired cognition   Cervical / Trunk Assessment Cervical / Trunk Assessment: Kyphotic(forward neck flexion)   Communication Communication Communication: Receptive difficulties;Expressive difficulties   Cognition Arousal/Alertness: Awake/alert Behavior During Therapy: Flat affect Overall Cognitive Status: Difficult to assess                                 General Comments: pt nodded head one time "yes" when asked if he wanted the TV on;otherwise pt with unintelligible communication;difficult to assess cognition;provided pt with toothbrush, attempted to brush mustache;required hand over hand assistance to wash face   General Comments  BP sitting 184/100, return to supine BP 188/99    Exercises     Shoulder Instructions      Home Living Family/patient expects to be discharged to:: Private residence Living Arrangements: Spouse/significant other Available Help at Discharge: Family;Available PRN/intermittently Type of Home: House Home Access: Level entry     Home Layout: One level     Bathroom Shower/Tub: Teacher, early years/pre: Standard Bathroom Accessibility: No   Home Equipment: Cane - single point   Additional Comments: unsure pt's prior level due to communication deficits, information above from previous admission      Prior Functioning/Environment Level of Independence: Needs assistance  Gait / Transfers Assistance Needed: pt was requiring RW with minA 12/19/2018   Communication / Swallowing Assistance Needed: pt required increased time and cues for following one step commands 12/19/2018 admission  Comments: information per chart, pt with impaired communication        OT Problem List: Decreased  strength;Decreased range of motion;Decreased activity tolerance;Impaired balance (sitting and/or standing);Impaired vision/perception;Decreased cognition;Decreased safety awareness;Decreased knowledge of precautions;Cardiopulmonary status limiting activity      OT Treatment/Interventions: Self-care/ADL training;Therapeutic exercise;Energy conservation;Neuromuscular education;DME and/or AE instruction;Therapeutic activities;Cognitive remediation/compensation;Visual/perceptual remediation/compensation;Patient/family education;Balance training    OT Goals(Current goals can be found in the care plan section) Acute Rehab OT Goals Patient Stated Goal: pt did not state OT Goal Formulation: Patient unable to participate in goal setting Time For Goal Achievement: 01/02/20 Potential to Achieve Goals: Good ADL Goals Pt Will Perform Eating: with supervision;sitting Pt Will Perform Grooming: with supervision;sitting Pt Will Transfer to Toilet: ambulating;with supervision Additional ADL Goal #1: Pt will follow multi-step commands consistently during ADL.  OT Frequency: Min 2X/week   Barriers to D/C: Decreased caregiver support  unsure pt's support at d/c       Co-evaluation PT/OT/SLP Co-Evaluation/Treatment: Yes Reason for Co-Treatment: Complexity of the patient's impairments (multi-system involvement);For patient/therapist safety;Necessary to address cognition/behavior during functional activity;To address functional/ADL transfers   OT goals addressed during session: ADL's and self-care      AM-PAC OT "6 Clicks" Daily Activity     Outcome Measure Help from another person eating meals?: Total Help from another person taking care of personal grooming?: Total Help from another person toileting, which includes using toliet, bedpan, or urinal?: Total Help from another person bathing (including washing, rinsing, drying)?: Total Help from another person to put on  and taking off regular upper body  clothing?: Total Help from another person to put on and taking off regular lower body clothing?: Total 6 Click Score: 6   End of Session Equipment Utilized During Treatment: Gait belt Nurse Communication: Mobility status  Activity Tolerance: Patient tolerated treatment well Patient left: in bed;with call bell/phone within reach;with bed alarm set  OT Visit Diagnosis: Unsteadiness on feet (R26.81);Other abnormalities of gait and mobility (R26.89);Muscle weakness (generalized) (M62.81);Feeding difficulties (R63.3);Other symptoms and signs involving cognitive function                Time: [id number] OT Time Calculation (min): 23 min Charges:  OT General Charges $OT Visit: 1 Visit OT Evaluation $OT Eval Moderate Complexity: [place] OTR/L Acute Rehabilitation Services Office: [place] 12/19/2019, 2:00 PM

## 2019-12-19 NOTE — Evaluation (Signed)
Physical Therapy Evaluation Patient Details Name: Ronald Wolf MRN: [id number] DOB: [birth year] Today's Date: 12/19/2019   History of Present Illness  74 year old male with PMH of anxiety, depression, hepatitis C, hypertension, hyperlipidemia, trigeminal neuralgia s/p knife treatment, CVA (left MCA), alcohol dependence in remission, vascular dementia, seizure disorder post stroke not on medications PTA, s/p urostomy, found by family unresponsive at 1 PM on 1/3. Acute stroke ruled out by MRI.  Admitted for suspected seizures, confirmed by EEG. On 1/5, treated with aggressive IVF per sepsis protocol, developed congested cough and IV changed to NSL.  Clinical Impression  Pt admitted with above. On PT evaluation, pt requiring two person maximal assist for transferring to standing; unable to ambulate due to abnormal posture. Presents with weakness, impaired tone, balance impairments, decreased cognition. Pt only following ~5% of simple commands, but is tracking therapist and attempting verbalizations (although nonsensical). Recommending post acute rehab to maximize functional mobility.     Follow Up Recommendations CIR;Supervision/Assistance - 24 hour    Equipment Recommendations  Other (comment)(TBA)    Recommendations for Other Services Rehab consult     Precautions / Restrictions Precautions Precautions: Fall Precaution Comments: seizure Restrictions Weight Bearing Restrictions: No      Mobility  Bed Mobility Overal bed mobility: Needs Assistance Bed Mobility: Supine to Sit;Sit to Supine     Supine to sit: Max assist;+2 for physical assistance;+2 for safety/equipment Sit to supine: +2 for physical assistance;+2 for safety/equipment;Max assist   General bed mobility comments: maxA for all aspects, pt initiated pushing trunk to upright posture  Transfers Overall transfer level: Needs assistance Equipment used: None Transfers: Sit to/from Stand Sit to Stand: Max assist;+2 physical  assistance;+2 safety/equipment         General transfer comment: pt required maxA to powerup into standing, unable to reach fully upright posture with increased cervical flexion, bilateral knee flexion  Ambulation/Gait                Stairs            Wheelchair Mobility    Modified Rankin (Stroke Patients Only)       Balance Overall balance assessment: Needs assistance Sitting-balance support: Bilateral upper extremity supported;Feet supported Sitting balance-Leahy Scale: Poor Sitting balance - Comments: reliant on external assistance Postural control: Right lateral lean Standing balance support: Bilateral upper extremity supported Standing balance-Leahy Scale: Zero Standing balance comment: unable to progress to full upright posture                             Pertinent Vitals/Pain Pain Assessment: Faces Faces Pain Scale: No hurt Pain Intervention(s): Monitored during session    Home Living Family/patient expects to be discharged to:: Private residence Living Arrangements: Spouse/significant other Available Help at Discharge: Family;Available PRN/intermittently Type of Home: House Home Access: Level entry     Home Layout: One level Home Equipment: Cane - single point Additional Comments: unsure pt's prior level due to communication deficits, information above from previous admission    Prior Function Level of Independence: Needs assistance   Gait / Transfers Assistance Needed: pt was requiring RW with minA 12/19/2018     Comments: information per chart, pt with impaired communication     Hand Dominance   Dominant Hand: Right    Extremity/Trunk Assessment   Upper Extremity Assessment Upper Extremity Assessment: Difficult to assess due to impaired cognition    Lower Extremity Assessment Lower Extremity Assessment: Difficult to assess due  to impaired cognition;RLE deficits/detail RLE Deficits / Details: Increased tone     Cervical / Trunk Assessment Cervical / Trunk Assessment: Kyphotic(forward neck flexion)  Communication   Communication: Receptive difficulties;Expressive difficulties  Cognition Arousal/Alertness: Awake/alert Behavior During Therapy: Flat affect Overall Cognitive Status: Difficult to assess                                 General Comments: pt nodded head one time "yes" when asked if he wanted the TV on;otherwise pt with unintelligible communication;difficult to assess cognition;provided pt with toothbrush, attempted to brush mustache;required hand over hand assistance to wash face      General Comments General comments (skin integrity, edema, etc.): BP sitting 184/100, return to supine BP 188/99    Exercises     Assessment/Plan    PT Assessment Patient needs continued PT services  PT Problem List Decreased strength;Decreased range of motion;Decreased activity tolerance;Decreased balance;Decreased mobility;Decreased cognition;Decreased safety awareness       PT Treatment Interventions DME instruction;Gait training;Therapeutic activities;Functional mobility training;Therapeutic exercise;Balance training;Patient/family education    PT Goals (Current goals can be found in the Care Plan section)  Acute Rehab PT Goals Patient Stated Goal: pt did not state PT Goal Formulation: With patient Time For Goal Achievement: 01/02/20 Potential to Achieve Goals: Fair    Frequency Min 3X/week   Barriers to discharge        Co-evaluation PT/OT/SLP Co-Evaluation/Treatment: Yes Reason for Co-Treatment: Complexity of the patient's impairments (multi-system involvement);Necessary to address cognition/behavior during functional activity;For patient/therapist safety;To address functional/ADL transfers PT goals addressed during session: Mobility/safety with mobility OT goals addressed during session: ADL's and self-care       AM-PAC PT "6 Clicks" Mobility  Outcome Measure  Help needed turning from your back to your side while in a flat bed without using bedrails?: A Lot Help needed moving from lying on your back to sitting on the side of a flat bed without using bedrails?: Total Help needed moving to and from a bed to a chair (including a wheelchair)?: Total Help needed standing up from a chair using your arms (e.g., wheelchair or bedside chair)?: Total Help needed to walk in hospital room?: Total Help needed climbing 3-5 steps with a railing? : Total 6 Click Score: 7    End of Session Equipment Utilized During Treatment: Gait belt Activity Tolerance: Other (comment)(limited due to decreased cognition) Patient left: in bed;with call bell/phone within reach;with bed alarm set Nurse Communication: Mobility status PT Visit Diagnosis: Other abnormalities of gait and mobility (R26.89)    Time: [id number] PT Time Calculation (min) (ACUTE ONLY): 23 min   Charges:   PT Evaluation $PT Eval Moderate Complexity: 1 Mod          Ellamae Sia, PT, DPT Acute Rehabilitation Services Pager [phone] Office [phone]   Ronald Wolf 12/19/2019, 2:44 PM

## 2019-12-20 ENCOUNTER — Inpatient Hospital Stay (HOSPITAL_COMMUNITY): Payer: Medicare PPO

## 2019-12-20 LAB — CBC
HCT: 34.9 % — ABNORMAL LOW (ref 39.0–52.0)
Hemoglobin: 11.5 g/dL — ABNORMAL LOW (ref 13.0–17.0)
MCH: 35.2 pg — ABNORMAL HIGH (ref 26.0–34.0)
MCHC: 33 g/dL (ref 30.0–36.0)
MCV: 106.7 fL — ABNORMAL HIGH (ref 80.0–100.0)
Platelets: 258 10*3/uL (ref 150–400)
RBC: 3.27 MIL/uL — ABNORMAL LOW (ref 4.22–5.81)
RDW: 12.5 % (ref 11.5–15.5)
WBC: 13 10*3/uL — ABNORMAL HIGH (ref 4.0–10.5)
nRBC: 0.2 % (ref 0.0–0.2)

## 2019-12-20 LAB — IRON AND TIBC
Iron: 87 ug/dL (ref 45–182)
Saturation Ratios: 47 % — ABNORMAL HIGH (ref 17.9–39.5)
TIBC: 186 ug/dL — ABNORMAL LOW (ref 250–450)
UIBC: 99 ug/dL

## 2019-12-20 LAB — BASIC METABOLIC PANEL
Anion gap: 9 (ref 5–15)
BUN: 19 mg/dL (ref 8–23)
CO2: 22 mmol/L (ref 22–32)
Calcium: 8.5 mg/dL — ABNORMAL LOW (ref 8.9–10.3)
Chloride: 115 mmol/L — ABNORMAL HIGH (ref 98–111)
Creatinine, Ser: 1.14 mg/dL (ref 0.61–1.24)
GFR calc Af Amer: >60 mL/min (ref >60)
GFR calc non Af Amer: >60 mL/min (ref >60)
Glucose, Bld: 118 mg/dL — ABNORMAL HIGH (ref 70–99)
Potassium: 2.8 mmol/L — ABNORMAL LOW (ref 3.5–5.1)
Sodium: 146 mmol/L — ABNORMAL HIGH (ref 135–145)

## 2019-12-20 LAB — FOLATE: Folate: 10 ng/mL (ref >5.9)

## 2019-12-20 LAB — FERRITIN: Ferritin: 1004 ng/mL — ABNORMAL HIGH (ref 24–336)

## 2019-12-20 LAB — OSMOLALITY: Osmolality: 306 mOsm/kg — ABNORMAL HIGH (ref 275–295)

## 2019-12-20 LAB — SODIUM, URINE, RANDOM: Sodium, Ur: 99 mmol/L

## 2019-12-20 LAB — OSMOLALITY, URINE: Osmolality, Ur: 504 mOsm/kg (ref 300–900)

## 2019-12-20 LAB — RETICULOCYTES
Immature Retic Fract: 7.2 % (ref 2.3–15.9)
RBC.: 3.26 MIL/uL — ABNORMAL LOW (ref 4.22–5.81)
Retic Count, Absolute: 46.3 10*3/uL (ref 19.0–186.0)
Retic Ct Pct: 1.4 % (ref 0.4–3.1)

## 2019-12-20 LAB — PROCALCITONIN: Procalcitonin: 0.73 ng/mL

## 2019-12-20 LAB — VITAMIN B12: Vitamin B-12: 267 pg/mL (ref 180–914)

## 2019-12-20 MED ORDER — CARVEDILOL 3.125 MG PO TABS
3.1250 mg | ORAL_TABLET | Freq: Two times a day (BID) | ORAL | Status: DC
  Administered 2019-12-20 – 2020-01-01 (×25): 3.125 mg via ORAL
  Filled 2019-12-20 (×25): qty 1

## 2019-12-20 MED ORDER — SODIUM CHLORIDE 0.45 % IV SOLN: INTRAVENOUS | Status: DC

## 2019-12-20 MED ORDER — ASPIRIN 81 MG PO CHEW
81.0000 mg | CHEWABLE_TABLET | Freq: Every day | ORAL | Status: DC
  Administered 2019-12-20 – 2020-01-01 (×13): 81 mg via ORAL
  Filled 2019-12-20 (×13): qty 1

## 2019-12-20 MED ORDER — POTASSIUM CHLORIDE IN NACL 20-0.45 MEQ/L-% IV SOLN
INTRAVENOUS | Status: AC
  Filled 2019-12-20 (×2): qty 1000

## 2019-12-20 MED ORDER — POTASSIUM CHLORIDE 10 MEQ/100ML IV SOLN
10.0000 meq | INTRAVENOUS | Status: AC
  Administered 2019-12-20 (×4): 10 meq via INTRAVENOUS
  Filled 2019-12-20 (×4): qty 100

## 2019-12-20 MED ORDER — ENSURE ENLIVE PO LIQD
237.0000 mL | Freq: Two times a day (BID) | ORAL | Status: DC
  Administered 2019-12-21 – 2020-01-01 (×24): 237 mL via ORAL

## 2019-12-20 MED ORDER — SODIUM CHLORIDE 3 % IN NEBU
4.0000 mL | INHALATION_SOLUTION | Freq: Two times a day (BID) | RESPIRATORY_TRACT | Status: DC
  Administered 2019-12-21: 4 mL via RESPIRATORY_TRACT
  Filled 2019-12-20 (×4): qty 4

## 2019-12-20 MED ORDER — POTASSIUM CHLORIDE 20 MEQ PO PACK
40.0000 meq | PACK | Freq: Two times a day (BID) | ORAL | Status: AC
  Administered 2019-12-20 (×2): 40 meq via ORAL
  Filled 2019-12-20 (×2): qty 2

## 2019-12-20 NOTE — Progress Notes (Signed)
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  Patient was found by family unresponsive at 1 PM on 12/16/2019.  Patient reportedly had difficulty talking, worsening right-sided deficits followed by seizures characterized by facial twitching witnessed in the ER.  Acute stroke was ruled out by MRI.    Patient was admitted for suspected seizures, confirmed by EEG.  Neurology consulted.  Hospital course complicated by febrile illness, sepsis due to possible UTI versus other etiology.  As per prior documentation, on 12/18/2019, patient was treated with aggressive IVF per sepsis protocol, developed congested cough.   0 12/20/2019: Patient was seen alongside patient's nurse.  Sodium of 146 and potassium of 2.8 is noted.  As per collateral information, patient be started on some sort of oral intake.  Leukocytosis is improving.  Probably urine culture grew multiple organisms.  Repeat urine culture done on 12/18/2019 is still pending.  Last chest x-ray did not reveal any acute abnormality.  Patient may be having difficulty coughing up phlegms.  Will check urine sodium, urine and serum osmolality.  Will start patient on half-normal saline with potassium chloride, and will cautiously monitor renal function and electrolytes.  Will replete potassium.  Last magnesium was 1.8.  Continue current antibiotics regimen.  Continue aspiration precautions.  Culture patient's sputum.  Further management will depend on hospital course  Subjective: No new complaints.  Patient is not a good historian.  Assessment & Plan:    Principal Problem: Acute metabolic encephalopathy -Thought to be related to sepsis/UTI and possibly a post ictal state -MRI on 12/16/2019 did not show an acute CVA -He continues to be confused today, not following commands and not able to tell me his name -He is undergone 2 EEGs and no epileptiform activity has been noted -He is being continued on Keppra 500 mg twice daily by neurology -He does have a history of alcohol use and history obtained by my colleague mention that a bottle of brandy would  last 1 to 2 weeks 12/20/2019: Patient seen today.  No new changes.  Kindly see above documentation.  UTI in setting of urostomy with sepsis(POA) - fever-leukocytosis-lactic acidosis with acute metabolic encephalopathy -Patient continued to have fevers on ceftriaxone on 1/4 and therefore the antibiotics were switched to Zosyn and vancomycin on 1/5 -Urine culture unfortunately was contaminated-repeat culture was ordered on 1/5 -MRSA PCR negative -Blood cultures x2 sets negative - CXR 1/5 negative- - temp ~ 99 today- might consider repeating CXR tomorrow to r/o pneumonia 12/19/2018: Currently see above documentation.  Will repeat chest x-ray.  We will follow pending urine culture.  We will culture patient's sputum.  Will start patient on nebs hypertonic saline to assist with expectoration of sputum.  Continue aspiration precautions.  Hypernatremia -IV fluids discontinued yesterday due to "gurgling" -Start D5 water today and follow 12/19/2018: Sodium today is 146.  IV fluid is on hold.  Start patient on half-normal saline at 75 cc an hour.  "Gurgling" noted this likely secondary to possible thick sputum and inability to expectorate easily.  Continue to monitor renal function and electrolytes.  Hypokalemia: -Potassium is 2.8 today. -Replete. -Magnesium is 1.8. -Hypokalemia will  likely resolve while volume depletion is corrected.  Thrush - start Diflucan  ?Dysphagia -SLP has evaluated him today and  recommends D1 diet with thin liquids 12/20/2019: Speech therapy input is appreciated.  We will follow speech recommendations.    HTN (hypertension) -He receives 12.5 mg twice daily metoprolol at home and is currently on IV Lopressor 5 mg 4 times daily -Also receiving labetalol as needed -I will add Norvasc as BP remains high 12/20/2019: Blood pressure still uncontrolled.  Will change metoprolol to Coreg 3.125 Mg p.o. twice daily.  Continue to monitor blood pressure closely.  May take a few more days to see the full impact of Norvasc.  Goal blood pressure should be less than 130/80 mmHg, but we need gradual control.  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  Tobacco abuse -Continue nicotine patch  Reported polysubstance abuse -UDS negative   Time spent in minutes: 35 DVT prophylaxis: Lovenox Code Status: Full code Family Communication:  Disposition Plan: cont to follow confusion Consultants:   neuro Procedures:   EEG x 2 Antimicrobials:  Anti-infectives (From admission, onward)   Start     Dose/Rate Route Frequency Ordered Stop   12/19/19 1000  vancomycin (VANCOREADY) IVPB 1250 mg/250 mL     1,250 mg 166.7 mL/hr over 90 Minutes Intravenous Every 24 hours 12/18/19 0914     12/18/19 1000  piperacillin-tazobactam (ZOSYN) IVPB 3.375 g     3.375 g 12.5 mL/hr over 240 Minutes Intravenous Every 8 hours 12/18/19 0903     12/18/19 0930  vancomycin (VANCOCIN) IVPB 1000 mg/200 mL premix  Status:  Discontinued     1,000 mg 200 mL/hr over 60 Minutes Intravenous  Once 12/18/19 0903 12/18/19 0906   12/18/19 0930  vancomycin (VANCOREADY) IVPB 1250 mg/250 mL     1,250 mg 166.7  mL/hr over 90 Minutes Intravenous  Once 12/18/19 0906 12/18/19 1205   12/17/19 1345  cefTRIAXone (ROCEPHIN) 1 g in sodium chloride 0.9 % 100 mL IVPB  Status:  Discontinued     1 g 200 mL/hr over 30 Minutes Intravenous Every 24 hours 12/17/19 1332 12/18/19 0838       Objective: Vitals:   12/19/19 2010 12/19/19 2348 12/20/19 0341 12/20/19 0949  BP: (!) 171/74 (!) 177/75 (!) 144/86 (!) 169/86  Pulse: (!) 101 83 73 89  Resp: (!) 22 (!) 25 (!) 22 14  Temp: 98.9 F (37.2 C) 99 F (37.2 C) 99.3 F (37.4 C)   TempSrc: Oral Oral Oral   SpO2: 98% 98% 100% 96%  Weight:      Height:        Intake/Output Summary (Last 24 hours) at 12/20/2019 1038 Last data filed at 12/20/2019 0700 Gross per 24 hour  Intake 937 ml  Output 1475 ml  Net -538 ml   Filed Weights   12/16/19 1559 12/17/19 1950  Weight: 70.6 kg 62.2 kg    Examination: General exam: Appears comfortable.  Patient is not in any distress. HEENT: Mild pallor.  No jaundice.   Respiratory system: Clear to auscultation. Respiratory effort normal. Cardiovascular system: S1 & S2    Gastrointestinal system: Abdomen soft, non-tender, nondistended. Normal bowel sounds. Central nervous system: Awake and alert.  Patient will not comply with full  neurological examination.  However, patient moves all extremities.   Extremities: No leg edema.  Data Reviewed: I have personally reviewed following labs and imaging studies  CBC: Recent Labs  Lab 12/16/19 1515 12/16/19 1535 12/18/19 0331 12/19/19 0224 12/20/19 0546  WBC 11.1*  --  17.4* 16.2* 13.0*  NEUTROABS 5.9  --   --  11.8*  --   HGB 13.8 14.6 13.5 13.6 11.5*  HCT 43.4 43.0 42.5 42.5 34.9*  MCV 110.7*  --  109.8* 108.7* 106.7*  PLT 286  --  254 219 0000000   Basic Metabolic Panel: Recent Labs  Lab 12/16/19 1515 12/16/19 1535 12/18/19 0331 12/19/19 0224 12/20/19 0546  NA 145 145 148* 147* 146*  K 3.6 3.4* 3.6 2.7* 2.8*  CL 111 110 110 115* 115*  CO2 24  --  24 19* 22   GLUCOSE 131* 121* 101* 80 118*  BUN 16 18 15 21 19   CREATININE 1.14 1.10 1.03 1.10 1.14  CALCIUM 9.5  --  9.3 8.6* 8.5*  MG  --   --   --  1.8  --    GFR: Estimated Creatinine Clearance: 50.8 mL/min (by C-G formula based on SCr of 1.14 mg/dL). Liver Function Tests: Recent Labs  Lab 12/16/19 1515  AST 36  ALT 40  ALKPHOS 100  BILITOT 0.9  PROT 7.5  ALBUMIN 3.7   No results for input(s): LIPASE, AMYLASE in the last 168 hours. Recent Labs  Lab 12/16/19 1656  AMMONIA 52*   Coagulation Profile: Recent Labs  Lab 12/16/19 1515  INR 1.0   Cardiac Enzymes: No results for input(s): CKTOTAL, CKMB, CKMBINDEX, TROPONINI in the last 168 hours. BNP (last 3 results) No results for input(s): PROBNP in the last 8760 hours. HbA1C: No results for input(s): HGBA1C in the last 72 hours. CBG: Recent Labs  Lab 12/16/19 1519  GLUCAP 117*   Lipid Profile: No results for input(s): CHOL, HDL, LDLCALC, TRIG, CHOLHDL, LDLDIRECT in the last 72 hours. Thyroid Function Tests: No results for input(s): TSH, T4TOTAL, FREET4, T3FREE, THYROIDAB in the last 72 hours. Anemia Panel: Recent Labs    12/20/19 0546  VITAMINB12 267  FOLATE 10.0  FERRITIN 1,004*  TIBC 186*  IRON 87  RETICCTPCT 1.4   Urine analysis:    Component Value Date/Time   COLORURINE AMBER (A) 12/17/2018 2159   APPEARANCEUR CLOUDY (A) 12/17/2018 2159   LABSPEC 1.012 12/17/2018 2159   PHURINE 9.0 (H) 12/17/2018 2159   GLUCOSEU NEGATIVE 12/17/2018 2159   GLUCOSEU NEGATIVE 08/21/2015 1412   HGBUR NEGATIVE 12/17/2018 2159   BILIRUBINUR NEGATIVE 12/17/2018 2159   BILIRUBINUR negative 12/20/2017 1846   KETONESUR NEGATIVE 12/17/2018 2159   PROTEINUR 100 (A) 12/17/2018 2159   UROBILINOGEN 0.2 12/20/2017 1846   UROBILINOGEN 0.2 08/21/2015 1412   NITRITE NEGATIVE 12/17/2018 2159   LEUKOCYTESUR TRACE (A) 12/17/2018 2159   Sepsis Labs: @LABRCNTIP (procalcitonin:4,lacticidven:4) ) Recent Results (from the past 240 hour(s))   SARS CORONAVIRUS 2 (TAT 6-24 HRS) Nasopharyngeal Nasopharyngeal Swab     Status: None   Collection Time: 12/16/19  3:55 PM   Specimen: Nasopharyngeal Swab  Result Value Ref Range Status   SARS Coronavirus 2 NEGATIVE NEGATIVE Final    Comment: (NOTE) SARS-CoV-2 target nucleic acids are NOT DETECTED. The SARS-CoV-2 RNA is generally detectable in upper and lower respiratory specimens during the acute phase of infection. Negative results do not preclude SARS-CoV-2 infection, do not rule out co-infections with other pathogens, and should not be used as the sole  basis for treatment or other patient management decisions. Negative results must be combined with clinical observations, patient history, and epidemiological information. The expected result is Negative. Fact Sheet for Patients: SugarRoll.be Fact Sheet for Healthcare Providers: https://www.woods-mathews.com/ This test is not yet approved or cleared by the [place] FDA and  has been authorized for detection and/or diagnosis of SARS-CoV-2 by FDA under an Emergency Use Authorization (EUA). This EUA will remain  in effect (meaning this test can be used) for the duration of the COVID-19 declaration under Section 56 4(b)(1) of the Act, 21 U.S.C. section 360bbb-3(b)(1), unless the authorization is terminated or revoked sooner. Performed at [facility], [place] [address] [place], [place] [address]   Culture, Urine     Status: Abnormal   Collection Time: 12/17/19  3:43 PM   Specimen: Urine, Random  Result Value Ref Range Status   Specimen Description URINE, RANDOM  Final   Special Requests   Final    NONE Performed at [facility], [place] [address] [place], [place] [address]    Culture MULTIPLE SPECIES PRESENT, SUGGEST RECOLLECTION (A)  Final   Report Status 12/18/2019 FINAL  Final  Culture, blood (routine x 2)     Status: None (Preliminary result)   Collection Time: 12/17/19   8:58 PM   Specimen: BLOOD  Result Value Ref Range Status   Specimen Description BLOOD LEFT ANTECUBITAL  Final   Special Requests   Final    BOTTLES DRAWN AEROBIC ONLY Blood Culture results may not be optimal due to an inadequate volume of blood received in culture bottles   Culture   Final    NO GROWTH 2 DAYS Performed at [facility], [place] [address] [place], [place] [address]    Report Status PENDING  Incomplete  Culture, blood (routine x 2)     Status: None (Preliminary result)   Collection Time: 12/17/19  9:17 PM   Specimen: BLOOD LEFT HAND  Result Value Ref Range Status   Specimen Description BLOOD LEFT HAND  Final   Special Requests   Final    BOTTLES DRAWN AEROBIC ONLY Blood Culture results may not be optimal due to an inadequate volume of blood received in culture bottles   Culture   Final    NO GROWTH 2 DAYS Performed at [facility], [place] [address] [place], [place] [address]    Report Status PENDING  Incomplete  MRSA PCR Screening     Status: None   Collection Time: 12/18/19  8:38 AM   Specimen: Nasopharyngeal  Result Value Ref Range Status   MRSA by PCR NEGATIVE NEGATIVE Final    Comment:        The GeneXpert MRSA Assay (FDA approved for NASAL specimens only), is one component of a comprehensive MRSA colonization surveillance program. It is not intended to diagnose MRSA infection nor to guide or monitor treatment for MRSA infections. Performed at [facility], [place] [address], [place],  [address]   Culture, Urine     Status: None   Collection Time: 12/18/19  4:48 PM   Specimen: Urine, Clean Catch  Result Value Ref Range Status   Specimen Description URINE, CLEAN CATCH  Final   Special Requests NONE  Final   Culture   Final    NO GROWTH Performed at [facility], [place] [address] [place],  [address]    Report Status 12/19/2019 FINAL  Final  Radiology Studies: EEG  Result Date: 12/18/2019 [name], MD     12/18/2019 12:51 PM Patient Name: Ronald Wolf MRN: [id number] Epilepsy Attending: [name] Referring Physician/Provider: Etta Quill, PA Date: 12/18/2019 Duration: 24.31 minutes  Patient history: 74 year old male with left MCA infarct presented with seizure-like episode, now with worsening ams.  EEG to evaluate for seizures.  Level of alertness: Lethargic  AEDs during EEG study: Keppra  Technical aspects: This EEG study was done with scalp electrodes positioned according to the 10-20 International system of electrode placement. Electrical activity was acquired at a sampling rate of 500Hz  and reviewed with a high frequency filter of 70Hz  and a low frequency filter of 1Hz . EEG data were recorded continuously and digitally stored.  Description: During awake state, no clear posterior dominant rhythm was seen.  EEG showed continuous generalized, maximal left frontotemporal 3 to 5 Hz slowing.  Sharp waves were also seen in left frontotemporal region.  Hyperventilation and photic stimulation were not performed.  Abnormality -Continuous slow, generalized, maximal left frontotemporal -Sharp waves, left frontotemporal  IMPRESSION: This study showed evidence of epileptogenicity and cortical dysfunction in left frontotemporal region likely secondary to underlying encephalomalacia.  Additionally there is evidence of mild to moderate diffuse encephalopathy, nonspecific etiology.  No seizures were seen throughout the recording. EEG is similar to previous study performed yesterday.  Priyanka [name]      Scheduled Meds: . amLODipine  10 mg Oral Daily  . aspirin  300 mg Rectal Daily  . atorvastatin  80 mg Oral q1800  . clopidogrel  75 mg Oral Daily  . enoxaparin (LOVENOX) injection  40 mg Subcutaneous Q24H  . folic acid  1 mg Oral Daily  . metoprolol tartrate  25 mg Oral BID  . multivitamin with minerals  1 tablet Oral Daily  . nicotine  21 mg Transdermal Daily  . potassium chloride  40 mEq Oral Q12H   . sodium chloride HYPERTONIC  4 mL Nebulization BID  . thiamine  100 mg Oral Daily   Or  . thiamine  100 mg Intravenous Daily   Continuous Infusions: . 0.45 % NaCl with KCl 20 mEq / L    . dextrose 110 mL/hr at 12/20/19 0351  . levETIRAcetam 500 mg (12/20/19 0950)  . piperacillin-tazobactam (ZOSYN)  IV 3.375 g (12/20/19 0203)  . potassium chloride    . vancomycin 1,250 mg (12/19/19 1035)     LOS: 4 days      Bonnell Public, MD [facility] Pager: [email or web address] Password [facility] 12/20/2019, 10:38 AM

## 2019-12-20 NOTE — Progress Notes (Signed)
Nutrition Follow-up  DOCUMENTATION CODES:   Not applicable  INTERVENTION:  Provide Ensure Enlive po BID, each supplement provides 350 kcal and 20 grams of protein.  Encourage adequate PO intake.   NUTRITION DIAGNOSIS:   Inadequate oral intake related to inability to eat as evidenced by NPO status; diet advanced; progressing  GOAL:   Patient will meet greater than or equal to 90% of their needs; progressing  MONITOR:   PO intake, Supplement acceptance, Diet advancement, Skin, Weight trends, Labs, I & O's  REASON FOR ASSESSMENT:   Consult Assessment of nutrition requirement/status  ASSESSMENT:   74 year old male with PMH of anxiety, depression, GERD, hepatitis C, hypertension, hyperlipidemia, trigeminal neuralgia s/p, knife treatment, CVA (left MCA), alcohol dependence in remission, vascular dementia, 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 MRI brain negative for acute stroke.  EEG confirms epileptogenicity and cortical dysfunction in left frontotemporal region.  Pt unavailable during attempted time of visit. MBS done yesterday. Pt with mild pharyngeal phase dysphagia. Diet has been advanced to a dysphagia 1 diet with thin liquids. Meal completion has been 75-90%. RD to order nutritional supplements to aid in caloric and protein needs.   Unable to complete Nutrition-Focused physical exam at this time.   Labs and medications reviewed.   Diet Order:   Diet Order            DIET - DYS 1 Room service appropriate? No; Fluid consistency: Thin  Diet effective now              EDUCATION NEEDS:   Not appropriate for education at this time  Skin:  Skin Assessment: Reviewed RN Assessment  Last BM:  1/7  Height:   Ht Readings from Last 1 Encounters:  12/16/19 5\' 9"  (1.753 m)    Weight:   Wt Readings from Last 1 Encounters:  12/17/19 62.2 kg    Ideal Body Weight:  72.7 kg  BMI:  Body mass index is 20.25  kg/m.  Estimated Nutritional Needs:   Kcal:  1850-2050  Protein:  85-100 grams  Fluid:  >/= 1.8 L/day    Corrin Parker, MS, RD, LDN Pager # [phone] After hours/ weekend pager # [phone]

## 2019-12-20 NOTE — Evaluation (Addendum)
Speech Language Pathology Evaluation Patient Details Name: Ronald Wolf MRN: [id number] DOB: 1946-11-30 Today's Date: 12/20/2019 Time: [id number] SLP Time Calculation (min) (ACUTE ONLY): 15 min  Problem List:  Patient Active Problem List   Diagnosis Date Noted  . Acute metabolic encephalopathy [id number]  . Seizure ([place]) 12/16/2019  . Polysubstance abuse ([place]) 12/16/2019  . History of stroke 12/16/2019  . Neurocognitive deficits 12/26/2018  . CVA (cerebral vascular accident) ([place]) 12/17/2018  . 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 Alcohol use, unspecified with unspecified alcohol-induced disorder ([place]) 12/25/2017  . Stroke ([place]) 12/20/2017  . Decreased visual acuity 06/18/2015  . History of bladder cancer 04/24/2015  . History of UTI 03/27/2015  . UTI (urinary tract infection) 11/21/2014  . Fatigue 10/23/2014  . Hyperglycemia 10/23/2014  . Wellness examination 10/23/2014  . PVD (peripheral vascular disease) ([place]) 07/28/2014  . Anxiety and depression 07/28/2014  . Palpitations 06/21/2014  . Cough 09/26/2013  . Atherosclerosis of arteries 05/13/2013  . Smoking 05/03/2013  . Lesion of liver 04/01/2013  . Rhinitis 10/05/2012  . Chronic scapular pain 10/05/2012  . Anemia 09/17/2012  . Bone lesion 09/17/2012  . HTN (hypertension) 06/12/2012  . Trigeminal neuralgia 05/28/2012   Past Medical History:  Past Medical History:  Diagnosis Date  . Abdominal pain, other specified site 09/26/2013  . Anxiety   . Arthritis   . Atherosclerosis of arteries 05/13/2013  . Cough 09/26/2013  . Depression   . Eating disorder   . GERD (gastroesophageal reflux disease)   . Hepatitis C   . Hypertension   . Lesion of liver 04/01/2013  . Loss of weight 06/21/2014  . Medial knee pain 09/26/2013  . Palpitations 06/21/2014  . Shortness of breath dyspnea    with exertion   Past Surgical History:  Past Surgical History:  Procedure Laterality Date  . IR ANGIO INTRA EXTRACRAN SEL COM CAROTID  INNOMINATE BILAT MOD SED  09/09/2017  . IR ANGIO VERTEBRAL SEL VERTEBRAL BILAT MOD SED  09/09/2017  . IR RADIOLOGIST EVAL & MGMT  09/07/2017  . MICROLARYNGOSCOPY WITH CO2 LASER AND EXCISION OF VOCAL CORD LESION N/A 05/14/2016   Procedure: MICROLARYNGOSCOPY WITH CO2 LASER AND EXCISION OF VOCAL CORD LESION;  Surgeon: Melida Quitter, MD;  Location: [facility];  Service: ENT;  Laterality: N/A;  Suspended micro laryngoscopy with CO2 laser excision of vocal fold lesion  . 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  He reportedly had difficulty talking, worsening right-sided deficits followed by seizures characterized by facial twitching witnessed in the ER.  Acute stroke ruled out by MRI.  Admitted for suspected seizures, confirmed by EEG.  Neurology consulted.  Hospital course complicated by febrile illness, sepsis due to UTI versus other etiology. On 1/5, treated with aggressive IVF per sepsis protocol, developed congested cough and IV changed to NSL. Patient seen briefly by SLP a year ago (Jan 2020) for speech/language related to left MCA.    Assessment / Plan / Recommendation Clinical Impression   Pt was largely nonverbal during speech-language evaluation. He was able to sustain attention to therapist but didn't indicate any understanding of information or tasks provided. He is oriented to his name/self but didn't state his name. He was not able  to follow any directions. He smiled x 1 when presented with yes/no questions but didn't indicate any responses to yes/no when attempting to convey information. At this time, pt's current speech-language abilities appear more impaired than during previous  admission (Jan 2020) as he is not able to communicate wants/needs. Skilled ST to follow for ongoing treatment of cognitive linguistic deficits and to aid with appropriate discharge planning.    SLP Assessment  SLP Recommendation/Assessment: Patient needs continued Speech Lanaguage Pathology Services SLP Visit Diagnosis: Cognitive communication deficit (R41.841)    Follow Up Recommendations  (TBD)    Frequency and Duration min 2x/week  2 weeks      SLP Evaluation Cognition  Overall Cognitive Status: No family/caregiver present to determine baseline cognitive functioning Arousal/Alertness: Awake/alert Orientation Level: Oriented to person Attention: Sustained Sustained Attention: Impaired Sustained Attention Impairment: Verbal basic;Functional basic       Comprehension  Auditory Comprehension Overall Auditory Comprehension: Impaired Commands: Impaired One Step Basic Commands: 0-24% accurate Visual Recognition/Discrimination Discrimination: Not tested    Expression Expression Primary Mode of Expression: Verbal Verbal Expression Overall Verbal Expression: Impaired Initiation: Impaired Written Expression Dominant Hand: Right Written Expression: Not tested   Oral / Motor  Motor Speech Overall Motor Speech: (no attempts to speak during assessment)   GO                    Saleema Weppler 12/20/2019, 12:11 PM

## 2019-12-20 NOTE — Progress Notes (Signed)
Modified Barium Swallow Progress Note  Patient Details  Name: Ronald Wolf MRN: [id number] Date of Birth: Jan 05, 1946  Today's Date: 12/20/2019  Modified Barium Swallow completed.  Full report located under Chart Review in the Imaging Section.  Brief recommendations include the following:  Clinical Impression  Pt presents with oral phase deficits related to cognitive impairments and mild pharyngeal phase dysphagia. Pt demonstrated functional oral abilities when consuming puree and thin liquids (via spoon, cup and straw). However, he was not able to masticate piece of graham caracker and pushed it out of this mouth. Additionally, pt's swallow is initiated at the pyriform sinuses but is swift and he protects his airway well. He does have moderate amounts of residue left with thin liquids with the vallecula and pyriform sinuses. At no point in the study do he ever have any aspiration of residue. Alternating trials of puree and thin liquids were also presented with no aspiration observed. Recommend dysphagia 1 diet with thin liquids and medicine crushed in puree.    Of note, there was one time during study that thin liquids appeared to have upward movement back into pyriform sinuses post swallow. Therefore recommend pt remain upright after eating.    Swallow Evaluation Recommendations       SLP Diet Recommendations: Dysphagia 1 (Puree) solids;Thin liquid   Liquid Administration via: Straw;Cup   Medication Administration: Crushed with puree   Supervision: Intermittent supervision to cue for compensatory strategies   Compensations: Minimize environmental distractions;Slow rate;Small sips/bites;Follow solids with liquid   Postural Changes: Remain semi-upright after after feeds/meals (Comment);Seated upright at 90 degrees   Oral Care Recommendations: Oral care BID        Layney Gillson 12/20/2019,11:58 AM

## 2019-12-20 NOTE — Progress Notes (Signed)
Orders to get an expectorated sputum assessment. Patient is not following commands to spit into container. Patient coughs spontaneously, but when cued to do so, he will not follow the command. Will keep trying. [place]ley

## 2019-12-21 LAB — CBC WITH DIFFERENTIAL/PLATELET
Abs Immature Granulocytes: 0.1 10*3/uL — ABNORMAL HIGH (ref 0.00–0.07)
Basophils Absolute: 0 10*3/uL (ref 0.0–0.1)
Basophils Relative: 0 %
Eosinophils Absolute: 0.1 10*3/uL (ref 0.0–0.5)
Eosinophils Relative: 1 %
HCT: 38.5 % — ABNORMAL LOW (ref 39.0–52.0)
Hemoglobin: 12.8 g/dL — ABNORMAL LOW (ref 13.0–17.0)
Immature Granulocytes: 1 %
Lymphocytes Relative: 25 %
Lymphs Abs: 3.3 10*3/uL (ref 0.7–4.0)
MCH: 34.8 pg — ABNORMAL HIGH (ref 26.0–34.0)
MCHC: 33.2 g/dL (ref 30.0–36.0)
MCV: 104.6 fL — ABNORMAL HIGH (ref 80.0–100.0)
Monocytes Absolute: 1.1 10*3/uL — ABNORMAL HIGH (ref 0.1–1.0)
Monocytes Relative: 8 %
Neutro Abs: 8.4 10*3/uL — ABNORMAL HIGH (ref 1.7–7.7)
Neutrophils Relative %: 65 %
Platelets: 261 10*3/uL (ref 150–400)
RBC: 3.68 MIL/uL — ABNORMAL LOW (ref 4.22–5.81)
RDW: 12.4 % (ref 11.5–15.5)
WBC: 12.9 10*3/uL — ABNORMAL HIGH (ref 4.0–10.5)
nRBC: 0.2 % (ref 0.0–0.2)

## 2019-12-21 LAB — RENAL FUNCTION PANEL
Albumin: 2.5 g/dL — ABNORMAL LOW (ref 3.5–5.0)
Anion gap: 11 (ref 5–15)
BUN: 15 mg/dL (ref 8–23)
CO2: 19 mmol/L — ABNORMAL LOW (ref 22–32)
Calcium: 8.5 mg/dL — ABNORMAL LOW (ref 8.9–10.3)
Chloride: 110 mmol/L (ref 98–111)
Creatinine, Ser: 0.87 mg/dL (ref 0.61–1.24)
GFR calc Af Amer: >60 mL/min (ref >60)
GFR calc non Af Amer: >60 mL/min (ref >60)
Glucose, Bld: 98 mg/dL (ref 70–99)
Phosphorus: 1.5 mg/dL — ABNORMAL LOW (ref 2.5–4.6)
Potassium: 3.8 mmol/L (ref 3.5–5.1)
Sodium: 140 mmol/L (ref 135–145)

## 2019-12-21 LAB — MAGNESIUM: Magnesium: 1.9 mg/dL (ref 1.7–2.4)

## 2019-12-21 NOTE — Progress Notes (Signed)
PROGRESS NOTE    Ronald Wolf  [id number] DOB: [birth year] DOA: 12/16/2019 PCP: Mosie Lukes, MD   Brief Narrative: Ronald Wolf is a 74 y.o. male with PMH of anxiety, depression, GERD, hepatitis C, hypertension, hyperlipidemia, trigeminal neuralgia s/p, knife treatment, CVA (left MCA), alcohol dependence in remission, vascular dementia, seizure disorder post stroke not on medications PTA, s/p urostomy. Patient presented after being found unresponsive with concern for seizures. Patient's hospitalization complicated by sepsis secondary to UTI.   Assessment & Plan:   Principal Problem:   Acute metabolic encephalopathy Active Problems:   HTN (hypertension)   Hyperglycemia   Seizure (HCC)   Polysubstance abuse (HCC)   History of stroke   Acute metabolic encephalopathy Likely multifactorial. Patient has not improved too much with regard to mental status but is more interactive than what is noted in chart review. MRI negative for acute process. EEG was not significant for noted seizures.  Seizures EEG consistent with epileptogenicity and cortical dysfunction in left frontotemporal region likely secondary to underlying encephalomalacia from prior CVA along with diffuse encephalopathic changes with no definite seizures. Patient started on Keppra -Continue Keppra  Sepsis Secondary to UTI. Physiology resolved. Vitals stable.  UTI In setting of urostomy. Patient managed on Ceftriaxone initially, which was switched to vancomycin and zosyn. Blood cultures negative. Urine cultures significant for (12/17/2019) multiple specie and (12/18/2019) no growth -Discontinue vancomycin -Continue Zosyn  Concern for dysphagia Evaluated by speech therapy and is on a dysphagia diet  History of left MCA stroke No acute stroke noted on recent MRI -Continue Lipitor, Plavix and aspirin  Hyperlipidemia -Continue Lipitor  Essential hypertension -Continue amlodipine and  Coreg  Hypernatremia Possibly secondary to dehydration. Improved with IV fluids.  Tobacco use -Continue nicotine patch  Alcohol abuse -Continue multivitamin, thiamine and folate  S/p urostomy  DVT prophylaxis: Lovenox Code Status:   Code Status: Full Code Family Communication: None at bedside Disposition Plan: Discharge pending possible clinical improvement; PT recommending CIR   Consultants:   Neurology  Procedures:   1/4: EEG Description: During awake state, no clear posterior dominant rhythm was seen.  EEG showed continuous generalized, maximal left frontotemporal 3 to 5 Hz slowing.  Sharp waves were also seen in left frontotemporal region.  Hyperventilation and photic stimulation were not performed.  Abnormality -Continuous slow, generalized, maximal left frontotemporal -Sharp waves, left frontotemporal  IMPRESSION: This study showed evidence of epileptogenicity and cortical dysfunction in left frontotemporal region likely secondary to underlying encephalomalacia.  Additionally there is evidence of mild to moderate diffuse encephalopathy, nonspecific etiology.  No seizures were seen throughout the recording.   1/5: EEG Description: During awake state, no clear posterior dominant rhythm was seen. EEG showed continuous generalized, maximal left frontotemporal 3 to 5 Hz slowing. Sharp waves were also seen in left frontotemporal region. Hyperventilation and photic stimulation were not performed.  Abnormality -Continuous slow, generalized, maximal left frontotemporal -Sharp waves, left frontotemporal  IMPRESSION: This study showed evidence of epileptogenicity and cortical dysfunction in left frontotemporal region likely secondary to underlying encephalomalacia. Additionally there is evidence of mild to moderate diffuse encephalopathy, nonspecific etiology. No seizures were seen throughout the recording.  EEG is similar to previous study performed  yesterday.  Antimicrobials:  Vancomycin  Ceftriaxone  Zosyn    Subjective: Not responsive to my questions  Objective: Vitals:   12/21/19 0854 12/21/19 0900 12/21/19 0956 12/21/19 1140  BP: (!) 165/77 (!) 165/77  (!) 156/87  Pulse:  82  96  Resp:  Marland Kitchen)  26 19 20   Temp:    99.3 F (37.4 C)  TempSrc:    Oral  SpO2:  98%  98%  Weight:      Height:        Intake/Output Summary (Last 24 hours) at 12/21/2019 1320 Last data filed at 12/21/2019 1101 Gross per 24 hour  Intake 3316.77 ml  Output 1800 ml  Net 1516.77 ml   Filed Weights   12/16/19 1559 12/17/19 1950  Weight: 70.6 kg 62.2 kg    Examination:  General exam: Appears calm and comfortable Respiratory system: Diminished. Respiratory effort normal. Cardiovascular system: S1 & S2 heard, RRR. No murmurs, rubs, gallops or clicks. Gastrointestinal system: Abdomen is nondistended, soft and nontender. No organomegaly or masses felt. Normal bowel sounds heard. Central nervous system: Alert. Follows some commands. Patient noted to be talking to family on the telephone when I stepped out of the room Extremities: No edema. No calf tenderness Skin: No cyanosis. No rashes Psychiatry: Judgement and insight appear impaired. Flat affect    Data Reviewed: I have personally reviewed following labs and imaging studies  CBC: Recent Labs  Lab 12/16/19 1515 12/16/19 1535 12/18/19 0331 12/19/19 0224 12/20/19 0546 12/21/19 0439  WBC 11.1*  --  17.4* 16.2* 13.0* 12.9*  NEUTROABS 5.9  --   --  11.8*  --  8.4*  HGB 13.8 14.6 13.5 13.6 11.5* 12.8*  HCT 43.4 43.0 42.5 42.5 34.9* 38.5*  MCV 110.7*  --  109.8* 108.7* 106.7* 104.6*  PLT 286  --  254 219 258 0000000   Basic Metabolic Panel: Recent Labs  Lab 12/16/19 1515 12/16/19 1535 12/18/19 0331 12/19/19 0224 12/20/19 0546 12/21/19 0439  NA 145 145 148* 147* 146* 140  K 3.6 3.4* 3.6 2.7* 2.8* 3.8  CL 111 110 110 115* 115* 110  CO2 24  --  24 19* 22 19*  GLUCOSE 131* 121* 101*  80 118* 98  BUN 16 18 15 21 19 15   CREATININE 1.14 1.10 1.03 1.10 1.14 0.87  CALCIUM 9.5  --  9.3 8.6* 8.5* 8.5*  MG  --   --   --  1.8  --  1.9  PHOS  --   --   --   --   --  1.5*   GFR: Estimated Creatinine Clearance: 66.5 mL/min (by C-G formula based on SCr of 0.87 mg/dL). Liver Function Tests: Recent Labs  Lab 12/16/19 1515 12/21/19 0439  AST 36  --   ALT 40  --   ALKPHOS 100  --   BILITOT 0.9  --   PROT 7.5  --   ALBUMIN 3.7 2.5*   No results for input(s): LIPASE, AMYLASE in the last 168 hours. Recent Labs  Lab 12/16/19 1656  AMMONIA 52*   Coagulation Profile: Recent Labs  Lab 12/16/19 1515  INR 1.0   Cardiac Enzymes: No results for input(s): CKTOTAL, CKMB, CKMBINDEX, TROPONINI in the last 168 hours. BNP (last 3 results) No results for input(s): PROBNP in the last 8760 hours. HbA1C: No results for input(s): HGBA1C in the last 72 hours. CBG: Recent Labs  Lab 12/16/19 1519  GLUCAP 117*   Lipid Profile: No results for input(s): CHOL, HDL, LDLCALC, TRIG, CHOLHDL, LDLDIRECT in the last 72 hours. Thyroid Function Tests: No results for input(s): TSH, T4TOTAL, FREET4, T3FREE, THYROIDAB in the last 72 hours. Anemia Panel: Recent Labs    12/20/19 0546  VITAMINB12 267  FOLATE 10.0  FERRITIN 1,004*  TIBC 186*  IRON 87  RETICCTPCT 1.4   Sepsis Labs: Recent Labs  Lab 12/18/19 1455 12/18/19 1716 12/19/19 1604 12/20/19 0546  PROCALCITON  --   --   --  0.73  LATICACIDVEN 3.5* 3.6* 1.7  --     Recent Results (from the past 240 hour(s))  SARS CORONAVIRUS 2 (TAT 6-24 HRS) Nasopharyngeal Nasopharyngeal Swab     Status: None   Collection Time: 12/16/19  3:55 PM   Specimen: Nasopharyngeal Swab  Result Value Ref Range Status   SARS Coronavirus 2 NEGATIVE NEGATIVE Final    Comment: (NOTE) SARS-CoV-2 target nucleic acids are NOT DETECTED. The SARS-CoV-2 RNA is generally detectable in upper and lower respiratory specimens during the acute phase of infection.  Negative results do not preclude SARS-CoV-2 infection, do not rule out co-infections with other pathogens, and should not be used as the sole basis for treatment or other patient management decisions. Negative results must be combined with clinical observations, patient history, and epidemiological information. The expected result is Negative. Fact Sheet for Patients: SugarRoll.be Fact Sheet for Healthcare Providers: https://www.woods-mathews.com/ This test is not yet approved or cleared by the [place] FDA and  has been authorized for detection and/or diagnosis of SARS-CoV-2 by FDA under an Emergency Use Authorization (EUA). This EUA will remain  in effect (meaning this test can be used) for the duration of the COVID-19 declaration under Section 56 4(b)(1) of the Act, 21 U.S.C. section 360bbb-3(b)(1), unless the authorization is terminated or revoked sooner. Performed at [facility], [place] [address] [place], [place] [address]   Culture, Urine     Status: Abnormal   Collection Time: 12/17/19  3:43 PM   Specimen: Urine, Random  Result Value Ref Range Status   Specimen Description URINE, RANDOM  Final   Special Requests   Final    NONE Performed at [facility], [place] [address], [place], [place] [address]    Culture MULTIPLE SPECIES PRESENT, SUGGEST RECOLLECTION (A)  Final   Report Status 12/18/2019 FINAL  Final  Culture, blood (routine x 2)     Status: None (Preliminary result)   Collection Time: 12/17/19  8:58 PM   Specimen: BLOOD  Result Value Ref Range Status   Specimen Description BLOOD LEFT ANTECUBITAL  Final   Special Requests   Final    BOTTLES DRAWN AEROBIC ONLY Blood Culture results may not be optimal due to an inadequate volume of blood received in culture bottles   Culture   Final    NO GROWTH 4 DAYS Performed at [facility], [place] [address], [place], [place] [address]    Report Status PENDING   Incomplete  Culture, blood (routine x 2)     Status: None (Preliminary result)   Collection Time: 12/17/19  9:17 PM   Specimen: BLOOD LEFT HAND  Result Value Ref Range Status   Specimen Description BLOOD LEFT HAND  Final   Special Requests   Final    BOTTLES DRAWN AEROBIC ONLY Blood Culture results may not be optimal due to an inadequate volume of blood received in culture bottles   Culture   Final    NO GROWTH 4 DAYS Performed at [facility], [place] [address], [place], [place] [address]    Report Status PENDING  Incomplete  MRSA PCR Screening     Status: None   Collection Time: 12/18/19  8:38 AM   Specimen: Nasopharyngeal  Result Value Ref Range Status   MRSA by PCR NEGATIVE NEGATIVE Final  Comment:        The GeneXpert MRSA Assay (FDA approved for NASAL specimens only), is one component of a comprehensive MRSA colonization surveillance program. It is not intended to diagnose MRSA infection nor to guide or monitor treatment for MRSA infections. Performed at [facility], [place] [address] [place], [place] [address]   Culture, Urine     Status: None   Collection Time: 12/18/19  4:48 PM   Specimen: Urine, Clean Catch  Result Value Ref Range Status   Specimen Description URINE, CLEAN CATCH  Final   Special Requests NONE  Final   Culture   Final    NO GROWTH Performed at [facility], [place] [address] [place], [place] [address]    Report Status 12/19/2019 FINAL  Final         Radiology Studies: DG CHEST PORT 1 VIEW  Result Date: 12/20/2019 CLINICAL DATA:  Encounter for pneumonia EXAM: PORTABLE CHEST 1 VIEW COMPARISON:  12/18/2019 FINDINGS: Stable cardiac silhouette.  Aortic calcification. Increased perihilar opacities with lower lobe predominance. No focal consolidation. No pleural fluid. Oral contrast noted in the stomach from recent swallowing study. IMPRESSION: Mild increase in bibasilar opacities could represent early pneumonia or edema.  Electronically Signed   By: Suzy Bouchard M.D.   On: 12/20/2019 11:47   DG Swallowing Func-Speech Pathology  Result Date: 12/20/2019 Objective Swallowing Evaluation: Type of Study: MBS-Modified Barium Swallow Study  Patient Details Name: Ronald Wolf MRN: [id number] Date of Birth: May 19, 1946 Today's Date: 12/20/2019 Time: SLP Start Time (ACUTE ONLY): [id number] -SLP Stop Time (ACUTE ONLY): 0940 SLP Time Calculation (min) (ACUTE ONLY): 16 min Past Medical History: Past Medical History: Diagnosis Date . Abdominal pain, other specified site 09/26/2013 . Anxiety  . Arthritis  . Atherosclerosis of arteries 05/13/2013 . Cough 09/26/2013 . Depression  . Eating disorder  . GERD (gastroesophageal reflux disease)  . Hepatitis C  . Hypertension  . Lesion of liver 04/01/2013 . Loss of weight 06/21/2014 . Medial knee pain 09/26/2013 . Palpitations 06/21/2014 . Shortness of breath dyspnea   with exertion Past Surgical History: Past Surgical History: Procedure Laterality Date . IR ANGIO INTRA EXTRACRAN SEL COM CAROTID INNOMINATE BILAT MOD SED  09/09/2017 . IR ANGIO VERTEBRAL SEL VERTEBRAL BILAT MOD SED  09/09/2017 . IR RADIOLOGIST EVAL & MGMT  09/07/2017 . MICROLARYNGOSCOPY WITH CO2 LASER AND EXCISION OF VOCAL CORD LESION N/A 05/14/2016  Procedure: MICROLARYNGOSCOPY WITH CO2 LASER AND EXCISION OF VOCAL CORD LESION;  Surgeon: Melida Quitter, MD;  Location: [facility];  Service: ENT;  Laterality: N/A;  Suspended micro laryngoscopy with CO2 laser excision of vocal fold lesion . TUMOR REMOVAL  1990  bladder HPI: 74 year old male with PMH of anxiety, depression, GERD, hepatitis C, hypertension, hyperlipidemia, trigeminal neuralgia s/p, knife treatment, CVA (left MCA), alcohol dependence in remission, vascular dementia, seizure disorder post stroke not on medications PTA, s/p urostomy, followed by family unresponsive at 1 PM on 1/3.  He reportedly had difficulty talking, worsening right-sided deficits followed by seizures characterized by facial twitching  witnessed in the ER.  Acute stroke ruled out by MRI.  Admitted for suspected seizures, confirmed by EEG.  Neurology consulted.  Hospital course complicated by febrile illness, sepsis due to UTI versus other etiology. On 1/5, treated with aggressive IVF per sepsis protocol, developed congested cough and IV changed to NSL. Patient seen briefly by SLP a year ago (Jan 2020) for speech/language related to left MCA.  Subjective: alert Assessment / Plan /  Recommendation CHL IP CLINICAL IMPRESSIONS 12/20/2019 Clinical Impression Pt presents with oral phase deficits related to cognitive impairments and mild pharyngeal phase dysphagia. Pt demonstrated functional oral abilities when consuming puree and thin liquids (via spoon, cup and straw). However, he was not able to masticate piece of graham caracker and pushed it out of this mouth. Additionally, pt's swallow is initiated at the pyriform sinuses but is swift and he protects his airway well. He does have moderate amounts of residue left with thin liquids with the vallecula and pyriform sinuses. At no point in the study do he ever have any aspiration of residue. Alternating trials of puree and thin liquids were also presented with no aspiration observed. Recommend dysphagia 1 diet with thin liquids and medicine crushed in puree.  SLP Visit Diagnosis Dysphagia, oropharyngeal phase (R13.12) Attention and concentration deficit following -- Frontal lobe and executive function deficit following -- Impact on safety and function Mild aspiration risk   CHL IP TREATMENT RECOMMENDATION 12/20/2019 Treatment Recommendations Therapy as outlined in treatment plan below   Prognosis 12/20/2019 Prognosis for Safe Diet Advancement Fair Barriers to Reach Goals Cognitive deficits Barriers/Prognosis Comment -- CHL IP DIET RECOMMENDATION 12/20/2019 SLP Diet Recommendations Dysphagia 1 (Puree) solids;Thin liquid Liquid Administration via Straw;Cup Medication Administration Crushed with puree Compensations  Minimize environmental distractions;Slow rate;Small sips/bites;Follow solids with liquid Postural Changes Remain semi-upright after after feeds/meals (Comment);Seated upright at 90 degrees   CHL IP OTHER RECOMMENDATIONS 12/20/2019 Recommended Consults -- Oral Care Recommendations Oral care BID Other Recommendations --   CHL IP FOLLOW UP RECOMMENDATIONS 12/20/2019 Follow up Recommendations (No Data)   CHL IP FREQUENCY AND DURATION 12/20/2019 Speech Therapy Frequency (ACUTE ONLY) min 2x/week Treatment Duration 2 weeks      CHL IP ORAL PHASE 12/20/2019 Oral Phase Impaired Oral - Pudding Teaspoon -- Oral - Pudding Cup -- Oral - Honey Teaspoon -- Oral - Honey Cup -- Oral - Nectar Teaspoon -- Oral - Nectar Cup -- Oral - Nectar Straw -- Oral - Thin Teaspoon WFL Oral - Thin Cup WFL Oral - Thin Straw WFL Oral - Puree WFL Oral - Mech Soft Weak lingual manipulation;Impaired mastication Oral - Regular -- Oral - Multi-Consistency -- Oral - Pill -- Oral Phase - Comment --  CHL IP PHARYNGEAL PHASE 12/20/2019 Pharyngeal Phase Impaired Pharyngeal- Pudding Teaspoon -- Pharyngeal -- Pharyngeal- Pudding Cup -- Pharyngeal -- Pharyngeal- Honey Teaspoon -- Pharyngeal -- Pharyngeal- Honey Cup -- Pharyngeal -- Pharyngeal- Nectar Teaspoon -- Pharyngeal -- Pharyngeal- Nectar Cup -- Pharyngeal -- Pharyngeal- Nectar Straw -- Pharyngeal -- Pharyngeal- Thin Teaspoon Delayed swallow initiation-pyriform sinuses;Pharyngeal residue - valleculae;Pharyngeal residue - pyriform Pharyngeal -- Pharyngeal- Thin Cup Delayed swallow initiation-pyriform sinuses;Pharyngeal residue - valleculae;Pharyngeal residue - pyriform Pharyngeal -- Pharyngeal- Thin Straw Delayed swallow initiation-pyriform sinuses;Pharyngeal residue - valleculae;Pharyngeal residue - pyriform Pharyngeal -- Pharyngeal- Puree WFL Pharyngeal -- Pharyngeal- Mechanical Soft -- Pharyngeal -- Pharyngeal- Regular -- Pharyngeal -- Pharyngeal- Multi-consistency -- Pharyngeal -- Pharyngeal- Pill -- Pharyngeal  -- Pharyngeal Comment --  CHL IP CERVICAL ESOPHAGEAL PHASE 12/20/2019 Cervical Esophageal Phase -- Pudding Teaspoon -- Pudding Cup -- Honey Teaspoon -- Honey Cup -- Nectar Teaspoon -- Nectar Cup -- Nectar Straw -- Thin Teaspoon -- Thin Cup -- Thin Straw -- Puree -- Mechanical Soft -- Regular -- Multi-consistency -- Pill -- Cervical Esophageal Comment (No Data) Happi Overton 12/20/2019, 12:00 PM                   Scheduled Meds: . amLODipine  10 mg Oral Daily  .  aspirin  81 mg Oral Daily  . atorvastatin  80 mg Oral q1800  . carvedilol  3.125 mg Oral BID WC  . clopidogrel  75 mg Oral Daily  . enoxaparin (LOVENOX) injection  40 mg Subcutaneous Q24H  . feeding supplement (ENSURE ENLIVE)  237 mL Oral BID BM  . folic acid  1 mg Oral Daily  . multivitamin with minerals  1 tablet Oral Daily  . nicotine  21 mg Transdermal Daily  . sodium chloride HYPERTONIC  4 mL Nebulization BID  . thiamine  100 mg Oral Daily   Or  . thiamine  100 mg Intravenous Daily   Continuous Infusions: . levETIRAcetam 500 mg (12/21/19 0928)  . piperacillin-tazobactam (ZOSYN)  IV 3.375 g (12/21/19 1015)     LOS: 5 days     Cordelia Poche, MD [facility] 12/21/2019, 1:20 PM  If 7PM-7AM, please contact night-coverage [email or web address]

## 2019-12-21 NOTE — Progress Notes (Signed)
Physical Therapy Treatment Patient Details Name: Ronald Wolf MRN: [id number] DOB: 1946-08-04 Today's Date: 12/21/2019    History of Present Illness 74 year old male with PMH of anxiety, depression, hepatitis C, hypertension, hyperlipidemia, trigeminal neuralgia s/p knife treatment, CVA (left MCA), alcohol dependence in remission, vascular dementia, seizure disorder post stroke not on medications PTA, s/p urostomy, found by family unresponsive at 1 PM on 1/3. Acute stroke ruled out by MRI.  Admitted for suspected seizures, confirmed by EEG. On 1/5, treated with aggressive IVF per sepsis protocol, developed congested cough and IV changed to NSL.    PT Comments    Pt performed gt training and functional mobility but limited due to elevate HR @ 154 bpm.  He was also limited due to stool incontinence.  Plan for CIR remains appropriate as he is progressing well but continues to require significant external assistance.  Will plan to progress gt next session with close monitoring on HR.      Follow Up Recommendations  CIR;Supervision/Assistance - 24 hour     Equipment Recommendations  Other (comment)(TBA)    Recommendations for Other Services Rehab consult     Precautions / Restrictions Precautions Precautions: Fall Precaution Comments: seizure Restrictions Weight Bearing Restrictions: No    Mobility  Bed Mobility Overal bed mobility: Needs Assistance Bed Mobility: Supine to Sit     Supine to sit: Mod assist     General bed mobility comments: Mod assistance to move to edge of bed.  He required assistance with B LEs to edge of bed and assistance to boost trunk into and seated position.  Transfers Overall transfer level: Needs assistance Equipment used: None Transfers: Sit to/from Stand Sit to Stand: Mod assist;+2 safety/equipment         General transfer comment: Mod assistance to power up into standing.  He required assistance to keep L hand on RW. Cues for hand placementt  to and from seated surface.  Ambulation/Gait Ambulation/Gait assistance: +2 safety/equipment;Mod assist Gait Distance (Feet): 4 Feet(terminated gt due to elevated HR and bowel incontinence.) Assistive device: Rolling walker (2 wheeled) Gait Pattern/deviations: Step-through pattern;Trunk flexed     General Gait Details: Limited as HR elevate to 154 bpm.  He was doing quite well leading off but HR became to high and further gt terminated.   Stairs             Wheelchair Mobility    Modified Rankin (Stroke Patients Only)       Balance Overall balance assessment: Needs assistance Sitting-balance support: Bilateral upper extremity supported;Feet supported Sitting balance-Leahy Scale: Poor       Standing balance-Leahy Scale: Zero Standing balance comment: unable to progress to full upright posture                            Cognition Arousal/Alertness: Awake/alert Behavior During Therapy: Flat affect Overall Cognitive Status: No family/caregiver present to determine baseline cognitive functioning                                 General Comments: Pt at times nodding head to answer questions.  He required multimodal cues to complete tasks.      Exercises      General Comments        Pertinent Vitals/Pain Pain Assessment: Faces Faces Pain Scale: No hurt    Home Living  Prior Function            PT Goals (current goals can now be found in the care plan section) Acute Rehab PT Goals Patient Stated Goal: pt did not state Potential to Achieve Goals: Fair Progress towards PT goals: Progressing toward goals    Frequency    Min 3X/week      PT Plan Current plan remains appropriate    Co-evaluation              AM-PAC PT "6 Clicks" Mobility   Outcome Measure  Help needed turning from your back to your side while in a flat bed without using bedrails?: A Lot Help needed moving from lying on  your back to sitting on the side of a flat bed without using bedrails?: A Lot Help needed moving to and from a bed to a chair (including a wheelchair)?: A Lot Help needed standing up from a chair using your arms (e.g., wheelchair or bedside chair)?: A Lot Help needed to walk in hospital room?: A Lot Help needed climbing 3-5 steps with a railing? : Total 6 Click Score: 11    End of Session Equipment Utilized During Treatment: Gait belt Activity Tolerance: Treatment limited secondary to medical complications (Comment)(HR elevated)   Nurse Communication: Mobility status PT Visit Diagnosis: Other abnormalities of gait and mobility (R26.89)     Time: [id number] PT Time Calculation (min) (ACUTE ONLY): 26 min  Charges:  $Therapeutic Activity: 23-37 mins                     Erasmo Leventhal , PTA Acute Rehabilitation Services Pager [phone] Office [phone]     Runell Kovich Eli Hose 12/21/2019, 2:33 PM

## 2019-12-21 NOTE — Progress Notes (Signed)
Pharmacy Antibiotic Note  Ronald Wolf is a 74 y.o. male admitted on 12/16/2019 with AMS after seizures and was started on Rocephin for possible UTI. The patient is with continued fevers and worsening WBC on Rocephin. Pharmacy has been consulted to broaden to Vancomycin + Zosyn. Cultures now NGTD and MRSA PCR is negative.   Plan: - Continue Vancomycin 1250 mg IV every 24 hours - Continue Zosyn 3.375g IV every 8 hours - F/u with MD re: de-escalation  - Will continue to follow renal function, culture results, LOT, and antibiotic plans    Height: 5\' 9"  (175.3 cm) Weight: 137 lb 2 oz (62.2 kg) IBW/kg (Calculated) : 70.7  Temp (24hrs), Avg:98.2 F (36.8 C), Min:97.5 F (36.4 C), Max:99 F (37.2 C)  Recent Labs  Lab 12/16/19 1515 12/16/19 1535 12/18/19 0331 12/18/19 1455 12/18/19 1716 12/19/19 0224 12/19/19 1604 12/20/19 0546 12/21/19 0439  WBC 11.1*  --  17.4*  --   --  16.2*  --  13.0* 12.9*  CREATININE 1.14 1.10 1.03  --   --  1.10  --  1.14 0.87  LATICACIDVEN  --   --   --  3.5* 3.6*  --  1.7  --   --     Estimated Creatinine Clearance: 66.5 mL/min (by C-G formula based on SCr of 0.87 mg/dL).    Allergies  Allergen Reactions  . Iohexol Hives     Desc: pt broke out in hives years ago from iv contrast. he was given 50mg  benadryl po, 1 hr prior to ct and did fine today w/o complications.  JB     Antimicrobials this admission: CTX 1/4 >> 1/5 Vanc 1/5 >> Zosyn 1/5 >>  Dose adjustments this admission: n/a  Microbiology results: 1/3 COVID >> neg 1/4 UCx >>NGTD 1/4 BCx >>NGTD 1/4 MRSA PCR >>negative  Annalyse Langlais A. Levada Dy, PharmD, BCPS, FNKF Clinical Pharmacist [place] Please utilize Amion for appropriate phone number to reach the unit pharmacist ([place])

## 2019-12-22 LAB — CULTURE, BLOOD (ROUTINE X 2)
Culture: NO GROWTH
Culture: NO GROWTH

## 2019-12-22 NOTE — Progress Notes (Signed)
PROGRESS NOTE    Ronald Wolf  [id number] DOB: [birth year] DOA: 12/16/2019 PCP: Mosie Lukes, MD   Brief Narrative: Ronald Wolf is a 74 y.o. male with PMH of anxiety, depression, GERD, hepatitis C, hypertension, hyperlipidemia, trigeminal neuralgia s/p, knife treatment, CVA (left MCA), alcohol dependence in remission, vascular dementia, seizure disorder post stroke not on medications PTA, s/p urostomy. Patient presented after being found unresponsive with concern for seizures. Patient's hospitalization complicated by sepsis secondary to UTI.   Assessment & Plan:   Principal Problem:   Acute metabolic encephalopathy Active Problems:   HTN (hypertension)   Hyperglycemia   Seizure (HCC)   Polysubstance abuse (HCC)   History of stroke   Acute metabolic encephalopathy Likely multifactorial. Patient has not improved too much with regard to mental status but is more interactive than what is noted in chart review. MRI negative for acute process. EEG was not significant for noted seizures.  Seizures EEG consistent with epileptogenicity and cortical dysfunction in left frontotemporal region likely secondary to underlying encephalomalacia from prior CVA along with diffuse encephalopathic changes with no definite seizures. Patient started on Keppra -Continue Keppra  Sepsis Secondary to UTI. Physiology resolved. Vitals stable.  UTI In setting of urostomy. Patient managed on Ceftriaxone initially, which was switched to vancomycin and zosyn. Blood cultures negative. Urine cultures significant for (12/17/2019) multiple specie and (12/18/2019) no growth -Continue Zosyn  Concern for dysphagia Evaluated by speech therapy and is on a dysphagia diet  History of left MCA stroke No acute stroke noted on recent MRI -Continue Lipitor, Plavix and aspirin  Hyperlipidemia -Continue Lipitor  Essential hypertension -Continue amlodipine and Coreg  Hypernatremia Possibly secondary to  dehydration. Improved with IV fluids.  Tobacco use -Continue nicotine patch  Alcohol abuse -Continue multivitamin, thiamine and folate  S/p urostomy  DVT prophylaxis: Lovenox Code Status:   Code Status: Full Code Family Communication: None at bedside Disposition Plan: Discharge pending possible clinical improvement; PT recommending CIR   Consultants:   Neurology  Procedures:   1/4: EEG Description: During awake state, no clear posterior dominant rhythm was seen.  EEG showed continuous generalized, maximal left frontotemporal 3 to 5 Hz slowing.  Sharp waves were also seen in left frontotemporal region.  Hyperventilation and photic stimulation were not performed.  Abnormality -Continuous slow, generalized, maximal left frontotemporal -Sharp waves, left frontotemporal  IMPRESSION: This study showed evidence of epileptogenicity and cortical dysfunction in left frontotemporal region likely secondary to underlying encephalomalacia.  Additionally there is evidence of mild to moderate diffuse encephalopathy, nonspecific etiology.  No seizures were seen throughout the recording.   1/5: EEG Description: During awake state, no clear posterior dominant rhythm was seen. EEG showed continuous generalized, maximal left frontotemporal 3 to 5 Hz slowing. Sharp waves were also seen in left frontotemporal region. Hyperventilation and photic stimulation were not performed.  Abnormality -Continuous slow, generalized, maximal left frontotemporal -Sharp waves, left frontotemporal  IMPRESSION: This study showed evidence of epileptogenicity and cortical dysfunction in left frontotemporal region likely secondary to underlying encephalomalacia. Additionally there is evidence of mild to moderate diffuse encephalopathy, nonspecific etiology. No seizures were seen throughout the recording.  EEG is similar to previous study performed  yesterday.  Antimicrobials:  Vancomycin  Ceftriaxone  Zosyn    Subjective: Patient responds unintelligibly to my questions and repeats the same answer.  Objective: Vitals:   12/22/19 0016 12/22/19 0403 12/22/19 0824 12/22/19 0904  BP: 129/71 133/71 123/70   Pulse: (!) 56 (!) 57 70 79  Resp: 18 16 18    Temp: 97.9 F (36.6 C) 98.1 F (36.7 C) 99.5 F (37.5 C)   TempSrc: Oral Oral Oral   SpO2: 99% 100% 100%   Weight:      Height:        Intake/Output Summary (Last 24 hours) at 12/22/2019 1048 Last data filed at 12/22/2019 0900 Gross per 24 hour  Intake 557.33 ml  Output 1750 ml  Net -1192.67 ml   Filed Weights   12/16/19 1559 12/17/19 1950  Weight: 70.6 kg 62.2 kg    Examination:  General exam: Appears calm and comfortable Respiratory system: Clear to auscultation. Respiratory effort normal. Cardiovascular system: S1 & S2 heard, RRR. No murmurs, rubs, gallops or clicks. Gastrointestinal system: Abdomen is nondistended, soft and nontender. Normal bowel sounds heard. Central nervous system: Alert Extremities: No edema. No calf tenderness Skin: No cyanosis. No rashes Psychiatry: Judgement and insight appear impaired.    Data Reviewed: I have personally reviewed following labs and imaging studies  CBC: Recent Labs  Lab 12/16/19 1515 12/16/19 1535 12/18/19 0331 12/19/19 0224 12/20/19 0546 12/21/19 0439  WBC 11.1*  --  17.4* 16.2* 13.0* 12.9*  NEUTROABS 5.9  --   --  11.8*  --  8.4*  HGB 13.8 14.6 13.5 13.6 11.5* 12.8*  HCT 43.4 43.0 42.5 42.5 34.9* 38.5*  MCV 110.7*  --  109.8* 108.7* 106.7* 104.6*  PLT 286  --  254 219 258 0000000   Basic Metabolic Panel: Recent Labs  Lab 12/16/19 1515 12/16/19 1535 12/18/19 0331 12/19/19 0224 12/20/19 0546 12/21/19 0439  NA 145 145 148* 147* 146* 140  K 3.6 3.4* 3.6 2.7* 2.8* 3.8  CL 111 110 110 115* 115* 110  CO2 24  --  24 19* 22 19*  GLUCOSE 131* 121* 101* 80 118* 98  BUN 16 18 15 21 19 15   CREATININE 1.14  1.10 1.03 1.10 1.14 0.87  CALCIUM 9.5  --  9.3 8.6* 8.5* 8.5*  MG  --   --   --  1.8  --  1.9  PHOS  --   --   --   --   --  1.5*   GFR: Estimated Creatinine Clearance: 66.5 mL/min (by C-G formula based on SCr of 0.87 mg/dL). Liver Function Tests: Recent Labs  Lab 12/16/19 1515 12/21/19 0439  AST 36  --   ALT 40  --   ALKPHOS 100  --   BILITOT 0.9  --   PROT 7.5  --   ALBUMIN 3.7 2.5*   No results for input(s): LIPASE, AMYLASE in the last 168 hours. Recent Labs  Lab 12/16/19 1656  AMMONIA 52*   Coagulation Profile: Recent Labs  Lab 12/16/19 1515  INR 1.0   Cardiac Enzymes: No results for input(s): CKTOTAL, CKMB, CKMBINDEX, TROPONINI in the last 168 hours. BNP (last 3 results) No results for input(s): PROBNP in the last 8760 hours. HbA1C: No results for input(s): HGBA1C in the last 72 hours. CBG: Recent Labs  Lab 12/16/19 1519  GLUCAP 117*   Lipid Profile: No results for input(s): CHOL, HDL, LDLCALC, TRIG, CHOLHDL, LDLDIRECT in the last 72 hours. Thyroid Function Tests: No results for input(s): TSH, T4TOTAL, FREET4, T3FREE, THYROIDAB in the last 72 hours. Anemia Panel: Recent Labs    12/20/19 0546  VITAMINB12 267  FOLATE 10.0  FERRITIN 1,004*  TIBC 186*  IRON 87  RETICCTPCT 1.4   Sepsis Labs: Recent Labs  Lab 12/18/19 1455 12/18/19 1716 12/19/19 1604  12/20/19 0546  PROCALCITON  --   --   --  0.73  LATICACIDVEN 3.5* 3.6* 1.7  --     Recent Results (from the past 240 hour(s))  SARS CORONAVIRUS 2 (TAT 6-24 HRS) Nasopharyngeal Nasopharyngeal Swab     Status: None   Collection Time: 12/16/19  3:55 PM   Specimen: Nasopharyngeal Swab  Result Value Ref Range Status   SARS Coronavirus 2 NEGATIVE NEGATIVE Final    Comment: (NOTE) SARS-CoV-2 target nucleic acids are NOT DETECTED. The SARS-CoV-2 RNA is generally detectable in upper and lower respiratory specimens during the acute phase of infection. Negative results do not preclude SARS-CoV-2  infection, do not rule out co-infections with other pathogens, and should not be used as the sole basis for treatment or other patient management decisions. Negative results must be combined with clinical observations, patient history, and epidemiological information. The expected result is Negative. Fact Sheet for Patients: SugarRoll.be Fact Sheet for Healthcare Providers: https://www.woods-mathews.com/ This test is not yet approved or cleared by the [place] FDA and  has been authorized for detection and/or diagnosis of SARS-CoV-2 by FDA under an Emergency Use Authorization (EUA). This EUA will remain  in effect (meaning this test can be used) for the duration of the COVID-19 declaration under Section 56 4(b)(1) of the Act, 21 U.S.C. section 360bbb-3(b)(1), unless the authorization is terminated or revoked sooner. Performed at [facility], [place] [address] [place], [place] [address]   Culture, Urine     Status: Abnormal   Collection Time: 12/17/19  3:43 PM   Specimen: Urine, Random  Result Value Ref Range Status   Specimen Description URINE, RANDOM  Final   Special Requests   Final    NONE Performed at [facility], [place] [address] [place], [place] [address]    Culture MULTIPLE SPECIES PRESENT, SUGGEST RECOLLECTION (A)  Final   Report Status 12/18/2019 FINAL  Final  Culture, blood (routine x 2)     Status: None   Collection Time: 12/17/19  8:58 PM   Specimen: BLOOD  Result Value Ref Range Status   Specimen Description BLOOD LEFT ANTECUBITAL  Final   Special Requests   Final    BOTTLES DRAWN AEROBIC ONLY Blood Culture results may not be optimal due to an inadequate volume of blood received in culture bottles   Culture   Final    NO GROWTH 5 DAYS Performed at [facility], [place] [address] [place], [place] [address]    Report Status 12/22/2019 FINAL  Final  Culture, blood (routine x 2)     Status: None    Collection Time: 12/17/19  9:17 PM   Specimen: BLOOD LEFT HAND  Result Value Ref Range Status   Specimen Description BLOOD LEFT HAND  Final   Special Requests   Final    BOTTLES DRAWN AEROBIC ONLY Blood Culture results may not be optimal due to an inadequate volume of blood received in culture bottles   Culture   Final    NO GROWTH 5 DAYS Performed at [facility], [place] [address], [place], [place] [address]    Report Status 12/22/2019 FINAL  Final  MRSA PCR Screening     Status: None   Collection Time: 12/18/19  8:38 AM   Specimen: Nasopharyngeal  Result Value Ref Range Status   MRSA by PCR NEGATIVE NEGATIVE Final    Comment:        The GeneXpert MRSA Assay (FDA approved for  NASAL specimens only), is one component of a comprehensive MRSA colonization surveillance program. It is not intended to diagnose MRSA infection nor to guide or monitor treatment for MRSA infections. Performed at [facility], [place] [address], [place], [place] [address]   Culture, Urine     Status: None   Collection Time: 12/18/19  4:48 PM   Specimen: Urine, Clean Catch  Result Value Ref Range Status   Specimen Description URINE, CLEAN CATCH  Final   Special Requests NONE  Final   Culture   Final    NO GROWTH Performed at [facility], [place] [address] [place], [place] [address]    Report Status 12/19/2019 FINAL  Final         Radiology Studies: DG CHEST PORT 1 VIEW  Result Date: 12/20/2019 CLINICAL DATA:  Encounter for pneumonia EXAM: PORTABLE CHEST 1 VIEW COMPARISON:  12/18/2019 FINDINGS: Stable cardiac silhouette.  Aortic calcification. Increased perihilar opacities with lower lobe predominance. No focal consolidation. No pleural fluid. Oral contrast noted in the stomach from recent swallowing study. IMPRESSION: Mild increase in bibasilar opacities could represent early pneumonia or edema. Electronically Signed   By: Suzy Bouchard M.D.   On: 12/20/2019 11:47         Scheduled Meds: . amLODipine  10 mg Oral Daily  . aspirin  81 mg Oral Daily  . atorvastatin  80 mg Oral q1800  . carvedilol  3.125 mg Oral BID WC  . clopidogrel  75 mg Oral Daily  . enoxaparin (LOVENOX) injection  40 mg Subcutaneous Q24H  . feeding supplement (ENSURE ENLIVE)  237 mL Oral BID BM  . folic acid  1 mg Oral Daily  . multivitamin with minerals  1 tablet Oral Daily  . nicotine  21 mg Transdermal Daily  . thiamine  100 mg Oral Daily   Or  . thiamine  100 mg Intravenous Daily   Continuous Infusions: . levETIRAcetam 500 mg (12/22/19 0904)  . piperacillin-tazobactam (ZOSYN)  IV 3.375 g (12/22/19 0920)     LOS: 6 days     Cordelia Poche, MD [facility] 12/22/2019, 10:48 AM  If 7PM-7AM, please contact night-coverage [email or web address]

## 2019-12-22 NOTE — Progress Notes (Signed)
Inpatient Rehabilitation Admissions Coordinator  Inpatient rehab admit recommended by Therapy. I will place order so that Admissions coordinator can fully assess patient for a possible CIR admit. AC will see pt on Monday.  Danne Baxter, RN, MSN Rehab Admissions Coordinator [phone] 12/22/2019 1:02 PM

## 2019-12-23 ENCOUNTER — Inpatient Hospital Stay (HOSPITAL_COMMUNITY): Payer: Medicare PPO

## 2019-12-23 LAB — CBC
HCT: 35 % — ABNORMAL LOW (ref 39.0–52.0)
Hemoglobin: 11.5 g/dL — ABNORMAL LOW (ref 13.0–17.0)
MCH: 34.7 pg — ABNORMAL HIGH (ref 26.0–34.0)
MCHC: 32.9 g/dL (ref 30.0–36.0)
MCV: 105.7 fL — ABNORMAL HIGH (ref 80.0–100.0)
Platelets: 276 10*3/uL (ref 150–400)
RBC: 3.31 MIL/uL — ABNORMAL LOW (ref 4.22–5.81)
RDW: 12.7 % (ref 11.5–15.5)
WBC: 10.2 10*3/uL (ref 4.0–10.5)
nRBC: 0 % (ref 0.0–0.2)

## 2019-12-23 NOTE — Progress Notes (Signed)
PROGRESS NOTE    [name]  [id number] DOB: [birth year] DOA: 12/16/2019 PCP: Mosie Lukes, MD   Brief Narrative: Ronald Wolf is a 74 y.o. male with PMH of anxiety, depression, GERD, hepatitis C, hypertension, hyperlipidemia, trigeminal neuralgia s/p, knife treatment, CVA (left MCA), alcohol dependence in remission, vascular dementia, seizure disorder post stroke not on medications PTA, s/p urostomy. Patient presented after being found unresponsive with concern for seizures. Patient's hospitalization complicated by sepsis secondary to UTI.   Assessment & Plan:   Principal Problem:   Acute metabolic encephalopathy Active Problems:   HTN (hypertension)   Hyperglycemia   Seizure (HCC)   Polysubstance abuse (HCC)   History of stroke   Acute metabolic encephalopathy Likely multifactorial. Patient has not improved too much with regard to mental status but is more interactive than what is noted in chart review. MRI negative for acute process. EEG was not significant for noted seizures.  Seizures EEG consistent with epileptogenicity and cortical dysfunction in left frontotemporal region likely secondary to underlying encephalomalacia from prior CVA along with diffuse encephalopathic changes with no definite seizures. Patient started on Keppra -Continue Keppra  Sepsis Secondary to UTI. Physiology resolved. Vitals stable.  UTI In setting of urostomy. Patient managed on Ceftriaxone initially, which was switched to vancomycin and zosyn. Blood cultures negative. Urine cultures significant for (12/17/2019) multiple specie and (12/18/2019) no growth -Continue Zosyn  Concern for dysphagia Evaluated by speech therapy and is on a dysphagia diet  History of left MCA stroke No acute stroke noted on recent MRI -Continue Lipitor, Plavix and aspirin  Hyperlipidemia -Continue Lipitor  Essential hypertension -Continue amlodipine and Coreg  Hypernatremia Possibly secondary to  dehydration. Improved with IV fluids.  Tobacco use -Continue nicotine patch  Alcohol abuse -Continue multivitamin, thiamine and folate  S/p urostomy  DVT prophylaxis: Lovenox Code Status:   Code Status: Full Code Family Communication: None at bedside Disposition Plan: Discharge pending possible clinical improvement; PT recommending CIR   Consultants:   Neurology  Procedures:   1/4: EEG Description: During awake state, no clear posterior dominant rhythm was seen.  EEG showed continuous generalized, maximal left frontotemporal 3 to 5 Hz slowing.  Sharp waves were also seen in left frontotemporal region.  Hyperventilation and photic stimulation were not performed.  Abnormality -Continuous slow, generalized, maximal left frontotemporal -Sharp waves, left frontotemporal  IMPRESSION: This study showed evidence of epileptogenicity and cortical dysfunction in left frontotemporal region likely secondary to underlying encephalomalacia.  Additionally there is evidence of mild to moderate diffuse encephalopathy, nonspecific etiology.  No seizures were seen throughout the recording.   1/5: EEG Description: During awake state, no clear posterior dominant rhythm was seen. EEG showed continuous generalized, maximal left frontotemporal 3 to 5 Hz slowing. Sharp waves were also seen in left frontotemporal region. Hyperventilation and photic stimulation were not performed.  Abnormality -Continuous slow, generalized, maximal left frontotemporal -Sharp waves, left frontotemporal  IMPRESSION: This study showed evidence of epileptogenicity and cortical dysfunction in left frontotemporal region likely secondary to underlying encephalomalacia. Additionally there is evidence of mild to moderate diffuse encephalopathy, nonspecific etiology. No seizures were seen throughout the recording.  EEG is similar to previous study performed  yesterday.  Antimicrobials:  Vancomycin  Ceftriaxone  Zosyn    Subjective: Patient asking "what's going on." Does not answer appropriately when asking about his day or any concerns.  Objective: Vitals:   12/23/19 0400 12/23/19 0750 12/23/19 1013 12/23/19 1140  BP: (!) 152/68 (!) 125/55 117/62 125/62  Pulse: 66 63  81  Resp: 20 18  18   Temp: 98.4 F (36.9 C) 98.5 F (36.9 C)  98.9 F (37.2 C)  TempSrc: Oral   Oral  SpO2: 99%   95%  Weight:      Height:        Intake/Output Summary (Last 24 hours) at 12/23/2019 1303 Last data filed at 12/23/2019 1100 Gross per 24 hour  Intake 946.23 ml  Output 1375 ml  Net -428.77 ml   Filed Weights   12/16/19 1559 12/17/19 1950  Weight: 70.6 kg 62.2 kg    Examination:  General exam: Appears calm and comfortable Respiratory system: Clear to auscultation. Respiratory effort normal. Cardiovascular system: S1 & S2 heard, RRR. No murmurs, rubs, gallops or clicks. Gastrointestinal system: Abdomen is nondistended, soft and nontender. No organomegaly or masses felt. Normal bowel sounds heard. Central nervous system: Alert. Does not follow commands but interactive with me, speaks to me Extremities: No edema. No calf tenderness Skin: No cyanosis. No rashes Psychiatry: Judgement and insight appear normal. Mood & affect appropriate.     Data Reviewed: I have personally reviewed following labs and imaging studies  CBC: Recent Labs  Lab 12/16/19 1515 12/18/19 0331 12/19/19 0224 12/20/19 0546 12/21/19 0439 12/23/19 0431  WBC 11.1* 17.4* 16.2* 13.0* 12.9* 10.2  NEUTROABS 5.9  --  11.8*  --  8.4*  --   HGB 13.8 13.5 13.6 11.5* 12.8* 11.5*  HCT 43.4 42.5 42.5 34.9* 38.5* 35.0*  MCV 110.7* 109.8* 108.7* 106.7* 104.6* 105.7*  PLT 286 254 219 258 261 [id number]   Basic Metabolic Panel: Recent Labs  Lab 12/16/19 1515 12/16/19 1535 12/18/19 0331 12/19/19 0224 12/20/19 0546 12/21/19 0439  NA 145 145 148* 147* 146* 140  K 3.6 3.4* 3.6  2.7* 2.8* 3.8  CL 111 110 110 115* 115* 110  CO2 24  --  24 19* 22 19*  GLUCOSE 131* 121* 101* 80 118* 98  BUN 16 18 15 21 19 15   CREATININE 1.14 1.10 1.03 1.10 1.14 0.87  CALCIUM 9.5  --  9.3 8.6* 8.5* 8.5*  MG  --   --   --  1.8  --  1.9  PHOS  --   --   --   --   --  1.5*   GFR: Estimated Creatinine Clearance: 66.5 mL/min (by C-G formula based on SCr of 0.87 mg/dL). Liver Function Tests: Recent Labs  Lab 12/16/19 1515 12/21/19 0439  AST 36  --   ALT 40  --   ALKPHOS 100  --   BILITOT 0.9  --   PROT 7.5  --   ALBUMIN 3.7 2.5*   No results for input(s): LIPASE, AMYLASE in the last 168 hours. Recent Labs  Lab 12/16/19 1656  AMMONIA 52*   Coagulation Profile: Recent Labs  Lab 12/16/19 1515  INR 1.0   Cardiac Enzymes: No results for input(s): CKTOTAL, CKMB, CKMBINDEX, TROPONINI in the last 168 hours. BNP (last 3 results) No results for input(s): PROBNP in the last 8760 hours. HbA1C: No results for input(s): HGBA1C in the last 72 hours. CBG: Recent Labs  Lab 12/16/19 1519  GLUCAP 117*   Lipid Profile: No results for input(s): CHOL, HDL, LDLCALC, TRIG, CHOLHDL, LDLDIRECT in the last 72 hours. Thyroid Function Tests: No results for input(s): TSH, T4TOTAL, FREET4, T3FREE, THYROIDAB in the last 72 hours. Anemia Panel: No results for input(s): VITAMINB12, FOLATE, FERRITIN, TIBC, IRON, RETICCTPCT in the last 72 hours. Sepsis Labs: Recent  Labs  Lab 12/18/19 1455 12/18/19 1716 12/19/19 1604 12/20/19 0546  PROCALCITON  --   --   --  0.73  LATICACIDVEN 3.5* 3.6* 1.7  --     Recent Results (from the past 240 hour(s))  SARS CORONAVIRUS 2 (TAT 6-24 HRS) Nasopharyngeal Nasopharyngeal Swab     Status: None   Collection Time: 12/16/19  3:55 PM   Specimen: Nasopharyngeal Swab  Result Value Ref Range Status   SARS Coronavirus 2 NEGATIVE NEGATIVE Final    Comment: (NOTE) SARS-CoV-2 target nucleic acids are NOT DETECTED. The SARS-CoV-2 RNA is generally detectable in  upper and lower respiratory specimens during the acute phase of infection. Negative results do not preclude SARS-CoV-2 infection, do not rule out co-infections with other pathogens, and should not be used as the sole basis for treatment or other patient management decisions. Negative results must be combined with clinical observations, patient history, and epidemiological information. The expected result is Negative. Fact Sheet for Patients: SugarRoll.be Fact Sheet for Healthcare Providers: https://www.woods-mathews.com/ This test is not yet approved or cleared by the [place] FDA and  has been authorized for detection and/or diagnosis of SARS-CoV-2 by FDA under an Emergency Use Authorization (EUA). This EUA will remain  in effect (meaning this test can be used) for the duration of the COVID-19 declaration under Section 56 4(b)(1) of the Act, 21 U.S.C. section 360bbb-3(b)(1), unless the authorization is terminated or revoked sooner. Performed at [facility], [place] [address] [place], [place] [address]   Culture, Urine     Status: Abnormal   Collection Time: 12/17/19  3:43 PM   Specimen: Urine, Random  Result Value Ref Range Status   Specimen Description URINE, RANDOM  Final   Special Requests   Final    NONE Performed at [facility], [place] [address], [place], [place] [address]    Culture MULTIPLE SPECIES PRESENT, SUGGEST RECOLLECTION (A)  Final   Report Status 12/18/2019 FINAL  Final  Culture, blood (routine x 2)     Status: None   Collection Time: 12/17/19  8:58 PM   Specimen: BLOOD  Result Value Ref Range Status   Specimen Description BLOOD LEFT ANTECUBITAL  Final   Special Requests   Final    BOTTLES DRAWN AEROBIC ONLY Blood Culture results may not be optimal due to an inadequate volume of blood received in culture bottles   Culture   Final    NO GROWTH 5 DAYS Performed at [facility], [place] [address],  [place], [place] [address]    Report Status 12/22/2019 FINAL  Final  Culture, blood (routine x 2)     Status: None   Collection Time: 12/17/19  9:17 PM   Specimen: BLOOD LEFT HAND  Result Value Ref Range Status   Specimen Description BLOOD LEFT HAND  Final   Special Requests   Final    BOTTLES DRAWN AEROBIC ONLY Blood Culture results may not be optimal due to an inadequate volume of blood received in culture bottles   Culture   Final    NO GROWTH 5 DAYS Performed at [facility], [place] [address] [place],  [address]    Report Status 12/22/2019 FINAL  Final  MRSA PCR Screening     Status: None   Collection Time: 12/18/19  8:38 AM   Specimen: Nasopharyngeal  Result Value Ref Range Status   MRSA by PCR NEGATIVE NEGATIVE Final    Comment:  The GeneXpert MRSA Assay (FDA approved for NASAL specimens only), is one component of a comprehensive MRSA colonization surveillance program. It is not intended to diagnose MRSA infection nor to guide or monitor treatment for MRSA infections. Performed at [facility], [place] [address] [place], [place] [address]   Culture, Urine     Status: None   Collection Time: 12/18/19  4:48 PM   Specimen: Urine, Clean Catch  Result Value Ref Range Status   Specimen Description URINE, CLEAN CATCH  Final   Special Requests NONE  Final   Culture   Final    NO GROWTH Performed at [facility], [place] [address] [place], [place] [address]    Report Status 12/19/2019 FINAL  Final         Radiology Studies: No results found.      Scheduled Meds: . amLODipine  10 mg Oral Daily  . aspirin  81 mg Oral Daily  . atorvastatin  80 mg Oral q1800  . carvedilol  3.125 mg Oral BID WC  . clopidogrel  75 mg Oral Daily  . enoxaparin (LOVENOX) injection  40 mg Subcutaneous Q24H  . feeding supplement (ENSURE ENLIVE)  237 mL Oral BID BM  . folic acid  1 mg Oral Daily  . multivitamin with minerals  1 tablet Oral Daily  . nicotine  21  mg Transdermal Daily  . thiamine  100 mg Oral Daily   Or  . thiamine  100 mg Intravenous Daily   Continuous Infusions: . levETIRAcetam 500 mg (12/23/19 1012)  . piperacillin-tazobactam (ZOSYN)  IV 3.375 g (12/23/19 1030)     LOS: 7 days     Cordelia Poche, MD [facility] 12/23/2019, 1:03 PM  If 7PM-7AM, please contact night-coverage [email or web address]

## 2019-12-23 NOTE — Progress Notes (Signed)
LTM EEG hooked up and running - no initial skin breakdown - push button tested - neuro notified.  

## 2019-12-23 NOTE — Procedures (Addendum)
Patient Name: Ronald Wolf  MRN: [id number]  Epilepsy Attending: [name]  Referring Physician/Provider: Dr Remi Haggard Aroor Date: 12/23/2019 Duration: 30.16 mins  Patient history: 74yo M with chronic L MCA infarct who presented with aphasia. EEG to evaluate for seizure.   Level of alertness: awake  AEDs during EEG study: LEV  Technical aspects: This EEG study was done with scalp electrodes positioned according to the 10-20 International system of electrode placement. Electrical activity was acquired at a sampling rate of 500Hz  and reviewed with a high frequency filter of 70Hz  and a low frequency filter of 1Hz . EEG data were recorded continuously and digitally stored.   BACKGROUND ACTIVITY: The posterior dominant rhythm consists of 10 Hz activity of moderate voltage (25-35 uV) seen predominantly in posterior head regions, symmetric and reactive to eye opening and eye closing. EEG showed continuous 3 to 5 Hz slowing in left frontotemporal. Sharp waves were also seen in left frontotemporal region. Hyperventilation and photic stimulation were not performed.         Abnormality -Continuous slow, left frontotemporal -Sharp waves, left frontotemporal  IMPRESSION: This study showed evidence of epileptogenicity and cortical dysfunction in left frontotemporal region likely secondary to underlying encephalomalacia. No seizures were seen throughout the recording.

## 2019-12-23 NOTE — Progress Notes (Signed)
EEG complete - results pending 

## 2019-12-23 NOTE — Progress Notes (Addendum)
NEUROLOGY PROGRESS NOTE  Subjective: Patient is awake, alert, NAD sitting up in bed with breakfast tray.  When asking him to follow commands he states "I already answered that  Or re-states his name. Spoke to patient's daughters ullinda: at baseline he is able to follow commands and answer questions. Right before he was admitted He was able to count his money. He was complaining to her about his vision and she states that his eyes were : running water" and he wanted her to make him an eye appointment.  Exam: Vitals:   12/23/19 0400 12/23/19 0750  BP: (!) 152/68 (!) 125/55  Pulse: 66 63  Resp: 20 18  Temp: 98.4 F (36.9 C) 98.5 F (36.9 C)  SpO2: 99%     Physical Exam  Constitutional: Cachectic Psych: Affect appropriate to situation Eyes: No scleral injection HENT: No OP obstrucion Head: Normocephalic.  Cardiovascular: Normal rate and regular rhythm.  Respiratory: Effort normal, non-labored breathing GI: Soft.  No distension. There is no tenderness.  Skin: WDI   Neuro:  Mental Status: Patient awake, alert.  When asked to follow commands as above states continue to stat his name or say " I already answered that". Does not follow commands.able to feed himself, but sometimes needs prompting. Cranial Nerves: blinks to threat bilaterally,  Pupils equal round and reactive, extraocular muscle intact, tracks my movements in the room, face symmetric Motor: Moving all extremities with right upper extremity weaker than left.  Moving all extremities spontaneously Plantar: Flexion bilaterally     Medications:  Scheduled: . amLODipine  10 mg Oral Daily  . aspirin  81 mg Oral Daily  . atorvastatin  80 mg Oral q1800  . carvedilol  3.125 mg Oral BID WC  . clopidogrel  75 mg Oral Daily  . enoxaparin (LOVENOX) injection  40 mg Subcutaneous Q24H  . feeding supplement (ENSURE ENLIVE)  237 mL Oral BID BM  . folic acid  1 mg Oral Daily  . multivitamin with minerals  1 tablet Oral Daily  .  nicotine  21 mg Transdermal Daily  . thiamine  100 mg Oral Daily   Or  . thiamine  100 mg Intravenous Daily   Continuous: . levETIRAcetam Stopped (12/22/19 2123)  . piperacillin-tazobactam (ZOSYN)  IV Stopped (12/23/19 [id number])    Pertinent Labs/Diagnostics: Labs on 12/21/19 -Sodium 146 -Potassium 2.8 12/23/19 -WBC WNL  EEG Result Date: 12/18/2019   IMPRESSION: This study showed evidence of epileptogenicity and cortical dysfunction in left frontotemporal region likely secondary to underlying encephalomalacia.  Additionally there is evidence of mild to moderate diffuse encephalopathy, nonspecific etiology.  No seizures were seen throughout the recording. EEG is similar to previous study performed yesterday.  Priyanka Angus Seller, MSN, NP-C [facility] [phone]  Attending Neurologist note to follow with final recommendations:   Assessment:  74 year old male with past medical history of CVA-left MCA, hepatitis C, hypertension and seizure disorder who presented to the emergency department with difficulty talking and worsening right-sided deficits followed by seizure characterized by facial twitching witnessed by ER staff.  MRI was obtained and did not show acute infarct. patient's exam yesterday was slightly different than the day before.  Repeat EEG did not show any epileptiform activity.  CT angiogram of the neck was noted for left carotid stenosis of 80% and right carotid stenosis at 50 to 70%.  CT angiogram of head showed intracranial atherosclerotic disease with bilateral M1 segment stenosis and PCA stenosis with significant P1 segment  stenosis.  Patient has had no further seizures since in hospital.  Both initial and repeat EEG did not show any epileptiform activity.    Encephalopathy likely secondary to sepsis   Impression: -Acute metabolic encephalopathy in the setting of sepsis -Seizure -Chronic left MCA stroke -Intracranial atherosclerotic  disease -Bilateral carotid stenosis  Recommendations: -Primary team to continue treating underlying sepsis, avoid cefepime if possible -- Will obtain 24 hr EEG to r/o subclinical seizures as patient still not back to baseline despite improvement of underlying sepsis -Continue Keppra 500 mg twice daily -Continue aspirin and Plavix, high-dose statin for intracranial and extracranial atherosclerotic disease -We will defer evaluation for carotid endarterectomy as the MRI was negative for acute infarcts, outpatient stroke neurology and vascular surgery follow-up -PT/OT and speech therapy -Seizure precautions   12/23/2019, 9:55 AM   NEUROHOSPITALIST ADDENDUM Performed a face to face diagnostic evaluation.   I have reviewed the contents of history and physical exam as documented by PA/ARNP/Resident and agree with above documentation.  I have discussed and formulated the above plan as documented. Edits to the note have been made as needed.  Patient still aphasic, spoke with patient's daughter and patient was more active, able to follow commands and answer questions before admission.  Sepsis is improved. Will obtain 24-hour continuous EEG to rule out subclinical seizures that may be causing patient to have persistent aphasia.  EEG negative for seizures, suspect worsening aphasia due to prolonged postictal deficits, recrudescence of stroke symptoms in the setting of sepsis, medication effect.  Suspect this will gradually improve over time.   Karena Addison Mekhi Lascola MD [facility] [phone]   If 7pm to 7am, please call on call as listed on AMION.

## 2019-12-24 MED ORDER — SODIUM CHLORIDE 0.9 % IV SOLN
750.0000 mg | Freq: Two times a day (BID) | INTRAVENOUS | Status: DC
  Administered 2019-12-24: 750 mg via INTRAVENOUS
  Filled 2019-12-24 (×3): qty 7.5

## 2019-12-24 NOTE — NC FL2 (Signed)
Ronald Wolf LEVEL OF CARE SCREENING TOOL     IDENTIFICATION  Patient Name: Ronald Wolf Birthdate: June 18, 1946 Sex: male Admission Date (Current Location): 12/16/2019  Ronald Wolf Number:  Ronald Wolf and Address:  Ronald Wolf. Ronald Wolf, Ronald Wolf [address], [place], [place] [address]      Provider Number: Ronald Wolf  Attending Physician Name and Address:  Ronald Aloe, MD  Relative Name and Phone Number:       Current Level of Care: Wolf Recommended Level of Care: [place] Prior Approval Number:    Date Approved/Denied:   PASRR Number: [id number]  Discharge Plan: SNF    Current Diagnoses: Patient Active Problem List   Diagnosis Date Noted  . Acute metabolic encephalopathy 123456  . Seizure ([place]) 12/16/2019  . Polysubstance abuse ([place]) 12/16/2019  . History of stroke 12/16/2019  . Neurocognitive deficits 12/26/2018  . CVA (cerebral vascular accident) ([place]) 12/17/2018  . 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 Alcohol use, unspecified with unspecified alcohol-induced disorder ([place]) 12/25/2017  . Stroke ([place]) 12/20/2017  . Decreased visual acuity 06/18/2015  . History of bladder cancer 04/24/2015  . History of UTI 03/27/2015  . UTI (urinary tract infection) 11/21/2014  . Fatigue 10/23/2014  . Hyperglycemia 10/23/2014  . Wellness examination 10/23/2014  . PVD (peripheral vascular disease) ([place]) 07/28/2014  . Anxiety and depression 07/28/2014  . Palpitations 06/21/2014  . Cough 09/26/2013  . Atherosclerosis of arteries 05/13/2013  . Smoking 05/03/2013  . Lesion of liver 04/01/2013  . Rhinitis 10/05/2012  . Chronic scapular pain 10/05/2012  . Anemia 09/17/2012  . Bone lesion 09/17/2012  . HTN (hypertension) 06/12/2012  . Trigeminal neuralgia 05/28/2012    Orientation RESPIRATION BLADDER Height & Weight     Self  Normal Urostomy Weight: 137 lb 2 oz (62.2 kg) Height:  5\' 9"  (175.3 cm)   BEHAVIORAL SYMPTOMS/MOOD NEUROLOGICAL BOWEL NUTRITION STATUS    Convulsions/Seizures Incontinent Diet(see DC Summary)  AMBULATORY STATUS COMMUNICATION OF NEEDS Skin   Extensive Assist Verbally Normal                       Personal Care Assistance Level of Assistance  Bathing, Feeding, Dressing Bathing Assistance: Maximum assistance Feeding assistance: Limited assistance Dressing Assistance: Maximum assistance     Functional Limitations Info  Speech     Speech Info: Impaired(dysarthria)    SPECIAL CARE FACTORS FREQUENCY  PT (By licensed PT), OT (By licensed OT), Speech therapy     PT Frequency: 5x/wk OT Frequency: 5x/wk     Speech Therapy Frequency: 5x/wk      Contractures Contractures Info: Not present    Additional Factors Info  Code Status, Allergies Code Status Info: Full Allergies Info: Iohexol           Current Medications (12/24/2019):  This is Ronald current Wolf active medication list Current Facility-Administered Medications  Medication Dose Route Frequency Provider Last Rate Last Admin  . acetaminophen (TYLENOL) tablet 650 mg  650 mg Oral Q4H PRN Karmen Bongo, MD   650 mg at 12/22/19 [id number]   Or  . acetaminophen (TYLENOL) 160 MG/5ML solution 650 mg  650 mg Per Tube Q4H PRN Karmen Bongo, MD       Or  . acetaminophen (TYLENOL) suppository 650 mg  650 mg Rectal Q4H PRN Karmen Bongo, MD   650 mg at 12/18/19 1713  . amLODipine (NORVASC) tablet 10 mg  10 mg Oral Daily Ronald Wolf, [place],  MD   10 mg at 12/24/19 0929  . aspirin chewable tablet 81 mg  81 mg Oral Daily Ronald Allan I, MD   81 mg at 12/24/19 0929  . atorvastatin (LIPITOR) tablet 80 mg  80 mg Oral q1800 Karmen Bongo, MD   80 mg at 12/23/19 1733  . carvedilol (COREG) tablet 3.125 mg  3.125 mg Oral BID WC Ronald Allan I, MD   3.125 mg at 12/24/19 0929  . clopidogrel (PLAVIX) tablet 75 mg  75 mg Oral Daily Karmen Bongo, MD   75 mg at 12/24/19 0929  . enoxaparin (LOVENOX)  injection 40 mg  40 mg Subcutaneous Q24H Karmen Bongo, MD   40 mg at 12/23/19 1728  . feeding supplement (ENSURE ENLIVE) (ENSURE ENLIVE) liquid 237 mL  237 mL Oral BID BM Ronald Allan I, MD   237 mL at 12/24/19 1442  . folic acid (FOLVITE) tablet 1 mg  1 mg Oral Daily Karmen Bongo, MD   1 mg at 12/24/19 0950  . labetalol (NORMODYNE) injection 10 mg  10 mg Intravenous Q2H PRN Modena Jansky, MD   10 mg at 12/19/19 1210  . levETIRAcetam (KEPPRA) 750 mg in sodium chloride 0.9 % 100 mL IVPB  750 mg Intravenous Q12H [name], MD      . multivitamin with minerals tablet 1 tablet  1 tablet Oral Daily Karmen Bongo, MD   1 tablet at 12/24/19 0929  . nicotine (NICODERM CQ - dosed in mg/24 hours) patch 21 mg  21 mg Transdermal Daily Karmen Bongo, MD   21 mg at 12/24/19 0927  . senna-docusate (Senokot-S) tablet 1 tablet  1 tablet Oral QHS PRN Karmen Bongo, MD      . thiamine tablet 100 mg  100 mg Oral Daily Karmen Bongo, MD   100 mg at 12/24/19 [id number]   Or  . thiamine (B-1) injection 100 mg  100 mg Intravenous Daily Karmen Bongo, MD   100 mg at 12/19/19 1001     Discharge Medications: Please see discharge summary for a list of discharge medications.  Relevant Imaging Results:  Relevant Lab Results:   Additional Information SS#: [id number]  Geralynn Ochs, LCSW

## 2019-12-24 NOTE — Progress Notes (Signed)
  Speech Language Pathology Treatment: Dysphagia  Patient Details Name: Ronald Wolf MRN: [id number] DOB: Jun 27, 1946 Today's Date: 12/24/2019 Time: [id number] SLP Time Calculation (min) (ACUTE ONLY): 14 min  Assessment / Plan / Recommendation Clinical Impression  Pt has no overt s/s of aspiration, but continues to have slow oral preparation and oral residue with soft solids. SLP provided pureed boluses to clear his mouth. Pt cleared his mouth better with mixed solid boluses (small piece of solid mixed with puree), but needed more puree than solid on the spoon at a time. Recommend continuing with current diet for now.   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  He reportedly had difficulty talking, worsening right-sided deficits followed by seizures characterized by facial twitching witnessed in the ER.  Acute stroke ruled out by MRI.  Admitted for suspected seizures, confirmed by EEG.  Neurology consulted.  Hospital course complicated by febrile illness, sepsis due to UTI versus other etiology. On 1/5, treated with aggressive IVF per sepsis protocol, developed congested cough and IV changed to NSL. Patient seen briefly by SLP a year ago (Jan 2020) for speech/language related to left MCA.       SLP Plan  Continue with current plan of care       Recommendations  Diet recommendations: Dysphagia 1 (puree);Thin liquid Liquids provided via: Cup;Straw Medication Administration: Crushed with puree Supervision: Staff to assist with self feeding;Full supervision/cueing for compensatory strategies Compensations: Minimize environmental distractions;Slow rate;Small sips/bites;Follow solids with liquid Postural Changes  and/or Swallow Maneuvers: Seated upright 90 degrees;Upright 30-60 min after meal                Oral Care Recommendations: Oral care BID Follow up Recommendations: Inpatient Rehab SLP Visit Diagnosis: Dysphagia, oral phase (R13.11) Plan: Continue with current plan of care       GO                Osie Bond., M.A. [place] Acute Rehabilitation Services Pager [phone] Office [phone]  12/24/2019, 12:36 PM

## 2019-12-24 NOTE — Progress Notes (Signed)
PT Cancellation Note  Patient Details Name: Ronald Wolf MRN: [id number] DOB: 1945-12-23   Cancelled Treatment:    Reason Eval/Treat Not Completed: (P) Other (comment)(Will defer tx until continuous EEG is removed to allow for progression of gt training.  RN will inform PT when patient is available.)   Cristela Blue 12/24/2019, 11:11 AM Erasmo Leventhal , PTA Acute Rehabilitation Services Pager [phone] Office [phone]

## 2019-12-24 NOTE — TOC Initial Note (Signed)
Transition of Care [facility]) - Initial/Assessment Note    Patient Details  Name: Ronald Wolf MRN: [id number] Date of Birth: 03/20/1946  [facility]) CM/SW Contact:    Pollie Friar, RN Phone Number: 12/24/2019, 3:10 PM  Clinical Narrative:                 Recommendations are for CIR. Awaiting CiR to evaluate. Pt was home with girlfriend prior to admission. TOC following.  Expected Discharge Plan: IP Rehab Facility Barriers to Discharge: Continued Medical Work up   Patient Goals and CMS Choice   CMS Medicare.gov Compare Post Acute Care list provided to:: Patient Represenative (must comment)    Expected Discharge Plan and Services Expected Discharge Plan: [place] In-house Referral: Clinical Social Work Discharge Planning Services: CM Consult Post Acute Care Choice: IP Rehab                                        Prior Living Arrangements/Services   Lives with:: Significant Other          Need for Family Participation in Patient Care: Yes (Comment)        Activities of Daily Living      Permission Sought/Granted                  Emotional Assessment       Orientation: : Oriented to Self Alcohol / Substance Use: Alcohol Use Psych Involvement: No (comment)  Admission diagnosis:  Seizure ([place]) [R56.9] Fever [R50.9] Cerebrovascular accident (CVA), unspecified mechanism ([place]) [I63.9] Patient Active Problem List   Diagnosis Date Noted  . Acute metabolic encephalopathy 123456  . Seizure ([place]) 12/16/2019  . Polysubstance abuse ([place]) 12/16/2019  . History of stroke 12/16/2019  . Neurocognitive deficits 12/26/2018  . CVA (cerebral vascular accident) ([place]) 12/17/2018  . 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 Alcohol use, unspecified with unspecified alcohol-induced disorder ([place]) 12/25/2017  . Stroke ([place]) 12/20/2017  . Decreased visual acuity 06/18/2015  . History of bladder cancer 04/24/2015  . History of UTI 03/27/2015   . UTI (urinary tract infection) 11/21/2014  . Fatigue 10/23/2014  . Hyperglycemia 10/23/2014  . Wellness examination 10/23/2014  . PVD (peripheral vascular disease) ([place]) 07/28/2014  . Anxiety and depression 07/28/2014  . Palpitations 06/21/2014  . Cough 09/26/2013  . Atherosclerosis of arteries 05/13/2013  . Smoking 05/03/2013  . Lesion of liver 04/01/2013  . Rhinitis 10/05/2012  . Chronic scapular pain 10/05/2012  . Anemia 09/17/2012  . Bone lesion 09/17/2012  . HTN (hypertension) 06/12/2012  . Trigeminal neuralgia 05/28/2012   PCP:  Mosie Lukes, MD Pharmacy:   [facility] [place], [address] [place] AT [address] [place] [address] Phone: [phone] Fax: [phone]     Social Determinants of Health (SDOH) Interventions    Readmission Risk Interventions No flowsheet data found.

## 2019-12-24 NOTE — Progress Notes (Addendum)
Subjective: No acute events overnight.  On my examination initially, patient looked at me but did not follow any commands.  When I turned back to reexamine with the nurse and with speech therapist, they reported that patient intermittently answers questions appropriately and follows some commands appropriately.  ROS: Unable to obtain due to poor mental status  Examination  Vital signs in last 24 hours: Temp:  [98.3 F (36.8 C)-99.2 F (37.3 C)] 98.5 F (36.9 C) (01/11 1244) Pulse Rate:  [85-102] 102 (01/11 1244) Resp:  [20-26] 20 (01/11 1244) BP: (122-154)/(63-81) 130/73 (01/11 1244) SpO2:  [97 %-99 %] 99 % (01/11 1244)  General: lying in bed,  CVS: pulse-normal rate and rhythm RS: breathing comfortably Extremities: normal   Neuro: MS: Awake, alert, did not follow commands on my initial exam but did follow simple commands like raising his arm and leg (occasionally appeared to be mimicking ) after reexamining with nurses. CN: pupils equal and reactive, able to track examiner in the room, no apparent facial asymmetry, rest of the cranial nerves difficult to examine as patient does not consistently follow commands  Motor: Antigravity strength in all 4 extremities L>R  Basic Metabolic Panel: Recent Labs  Lab 12/18/19 0331 12/19/19 0224 12/20/19 0546 12/21/19 0439  NA 148* 147* 146* 140  K 3.6 2.7* 2.8* 3.8  CL 110 115* 115* 110  CO2 24 19* 22 19*  GLUCOSE 101* 80 118* 98  BUN 15 21 19 15   CREATININE 1.03 1.10 1.14 0.87  CALCIUM 9.3 8.6* 8.5* 8.5*  MG  --  1.8  --  1.9  PHOS  --   --   --  1.5*    CBC: Recent Labs  Lab 12/18/19 0331 12/19/19 0224 12/20/19 0546 12/21/19 0439 12/23/19 0431  WBC 17.4* 16.2* 13.0* 12.9* 10.2  NEUTROABS  --  11.8*  --  8.4*  --   HGB 13.5 13.6 11.5* 12.8* 11.5*  HCT 42.5 42.5 34.9* 38.5* 35.0*  MCV 109.8* 108.7* 106.7* 104.6* 105.7*  PLT 254 219 258 261 276     Coagulation Studies: No results for input(s): LABPROT, INR in the last  72 hours.  Imaging MRI brain without contrast 12/16/2019: Extensive old infarction in the left MCA territory. No sign of acute or subacute infarction. 2 small foci of restricted diffusion within the regions of old infarction in the left posterior frontal and parietal region not felt to relate to acute insult. Hemosiderin deposition in the regions of the old left MCA infarction.  ASSESSMENT AND PLAN: 74 year old male with previous left MCA infarct who presented on 12/16/2019 for altered mental status.  He had an EEG which showed left frontotemporal sharp waves.  Therefore patient was started on Keppra.  Outside infectious work-up which was negative.  Epilepsy Acute on chronic infarcts Aphasia Transient alteration of awareness -Likely etiology for transient alteration of awareness and aphasia is intermittent seizures/postictal state versus hypoperfusion in setting of bilateral carotid stenosis versus less likely behavioral  Recommendations -LTM EEG did not show any definite seizures.  However, as patient has intermittent episodes where he is more responsive followed by periods of relatively less responsiveness, given his previous stroke this could possibly be secondary to seizures. -DC LTM EEG as no definite seizures. -Increase Keppra to 750 mg twice daily   -We will check orthostatic vital signs to look for orthostasis -Continue aspirin, Plavix, statin for secondary stroke prevention -Continue seizure precautions -As needed IV Ativan 2 mg for generalized tonic-clonic seizure lasting more than 2  minutes or focal seizure lasting more than 5 minutes -Minimize sedating medications -Neurology follow-up in 6 to 8 weeks after discharge  Thank you for allowing [place] to participate in the care of this patient.  Neurology will follow.  Please page neuro hospitalist for further questions after 5 PM.   I have spent a total of 25 minutes with the patient reviewing hospital notes,  test results, labs and  examining the patient as well as establishing an assessment and plan that was discussed personally with the patient and the team.  > 50% of time was spent in direct patient care.

## 2019-12-24 NOTE — Progress Notes (Signed)
Physical Therapy Treatment Patient Details Name: Ronald Wolf MRN: [id number] DOB: [birth year] Today's Date: 12/24/2019    History of Present Illness 74 year old male with PMH of anxiety, depression, hepatitis C, hypertension, hyperlipidemia, trigeminal neuralgia s/p knife treatment, CVA (left MCA), alcohol dependence in remission, vascular dementia, seizure disorder post stroke not on medications PTA, s/p urostomy, found by family unresponsive at 1 PM on 1/3. Acute stroke ruled out by MRI.  Admitted for suspected seizures, confirmed by EEG. On 1/5, treated with aggressive IVF per sepsis protocol, developed congested cough and IV changed to NSL.    PT Comments    Pt performed gt training and functional mobility during session this pm.  He required max encouragement to participate.  He presents with R sided neglect and decreased strength.  PLan remains appropriate for skilled post acute rehab at CIR to improve strength and function before returning home.    HR elevated to 123 bpm    Follow Up Recommendations  CIR;Supervision/Assistance - 24 hour     Equipment Recommendations  (TBA)    Recommendations for Other Services       Precautions / Restrictions Precautions Precautions: Fall Precaution Comments: seizure Restrictions Weight Bearing Restrictions: No    Mobility  Bed Mobility Overal bed mobility: Needs Assistance Bed Mobility: Supine to Sit;Sit to Supine     Supine to sit: Mod assist     General bed mobility comments: Pt required assistance to move to edge of bed with multimodal cueing for hand placement to improve ease of transfer.  Transfers Overall transfer level: Needs assistance Equipment used: Rolling walker (2 wheeled) Transfers: Sit to/from Stand Sit to Stand: Mod assist         General transfer comment: Pt placed B hands on RW to achieve standing.  Moderate assistance to boost into standing.  Ambulation/Gait Ambulation/Gait assistance: Mod assist Gait  Distance (Feet): 24 Feet Assistive device: Rolling walker (2 wheeled) Gait Pattern/deviations: Step-through pattern;Trunk flexed     General Gait Details: HR elevated to 123 bpm.  Cues to stand tall and step closer.  Pt with poor vision and obstacle negotiation on the R side.  Cues for obstacle avoidance.   Stairs             Wheelchair Mobility    Modified Rankin (Stroke Patients Only)       Balance Overall balance assessment: Needs assistance Sitting-balance support: Bilateral upper extremity supported;Feet supported Sitting balance-Leahy Scale: Poor Sitting balance - Comments: reliant on external assistance     Standing balance-Leahy Scale: Zero Standing balance comment: unable to progress to full upright posture                            Cognition Arousal/Alertness: Awake/alert Behavior During Therapy: Flat affect Overall Cognitive Status: No family/caregiver present to determine baseline cognitive functioning                                 General Comments: Pt more communicative during session this pm.  He required max VCs to participate in session.      Exercises      General Comments        Pertinent Vitals/Pain Pain Assessment: Faces Pain Score: 0-No pain Faces Pain Scale: No hurt    Home Living  Prior Function            PT Goals (current goals can now be found in the care plan section) Acute Rehab PT Goals Patient Stated Goal: pt did not state Potential to Achieve Goals: Fair Progress towards PT goals: Progressing toward goals    Frequency    Min 3X/week      PT Plan Current plan remains appropriate    Co-evaluation              AM-PAC PT "6 Clicks" Mobility   Outcome Measure  Help needed turning from your back to your side while in a flat bed without using bedrails?: A Lot Help needed moving from lying on your back to sitting on the side of a flat bed without  using bedrails?: A Lot Help needed moving to and from a bed to a chair (including a wheelchair)?: A Lot Help needed standing up from a chair using your arms (e.g., wheelchair or bedside chair)?: A Lot Help needed to walk in hospital room?: A Lot Help needed climbing 3-5 steps with a railing? : Total 6 Click Score: 11    End of Session Equipment Utilized During Treatment: Gait belt Activity Tolerance: Treatment limited secondary to medical complications (Comment)(HR elevated) Patient left: in chair;with call bell/phone within reach;with chair alarm set Nurse Communication: Mobility status PT Visit Diagnosis: Other abnormalities of gait and mobility (R26.89)     Time: [id number] PT Time Calculation (min) (ACUTE ONLY): 20 min  Charges:  $Gait Training: 8-22 mins                     Erasmo Leventhal , PTA Acute Rehabilitation Services Pager [phone] Office [phone]     Jil Penland Eli Hose 12/24/2019, 3:13 PM

## 2019-12-24 NOTE — Progress Notes (Signed)
   12/24/19 1536  Orthostatic Lying   BP- Lying 134/72  Pulse- Lying 98  Orthostatic Sitting  BP- Sitting 141/70  Pulse- Sitting 105  Orthostatic Standing at 0 minutes  BP- Standing at 0 minutes 135/67  Pulse- Standing at 0 minutes 124  Orthostatic Standing at 3 minutes  BP- Standing at 3 minutes 126/80  Pulse- Standing at 3 minutes 131

## 2019-12-24 NOTE — Plan of Care (Signed)
  Problem: Education: Goal: Knowledge of General Education information will improve Description: Including pain rating scale, medication(s)/side effects and non-pharmacologic comfort measures Outcome: Progressing   Problem: Health Behavior/Discharge Planning: Goal: Ability to manage health-related needs will improve Outcome: Progressing   Problem: Clinical Measurements: Goal: Ability to maintain clinical measurements within normal limits will improve Outcome: Progressing Goal: Will remain free from infection Outcome: Progressing Goal: Diagnostic test results will improve Outcome: Progressing Goal: Respiratory complications will improve Outcome: Progressing Goal: Cardiovascular complication will be avoided Outcome: Progressing   Problem: Activity: Goal: Risk for activity intolerance will decrease Outcome: Progressing   Problem: Nutrition: Goal: Adequate nutrition will be maintained Outcome: Progressing   Problem: Coping: Goal: Level of anxiety will decrease Outcome: Progressing   Problem: Elimination: Goal: Will not experience complications related to bowel motility Outcome: Progressing Goal: Will not experience complications related to urinary retention Outcome: Progressing   Problem: Pain Managment: Goal: General experience of comfort will improve Outcome: Progressing   Problem: Safety: Goal: Ability to remain free from injury will improve Outcome: Progressing   Problem: Skin Integrity: Goal: Risk for impaired skin integrity will decrease Outcome: Progressing   Problem: Education: Goal: Expressions of having a comfortable level of knowledge regarding the disease process will increase Outcome: Progressing   Problem: Coping: Goal: Ability to adjust to condition or change in health will improve Outcome: Progressing Goal: Ability to identify appropriate support needs will improve Outcome: Progressing   Problem: Health Behavior/Discharge Planning: Goal:  Compliance with prescribed medication regimen will improve Outcome: Progressing   Problem: Medication: Goal: Risk for medication side effects will decrease Outcome: Progressing   Problem: Clinical Measurements: Goal: Complications related to the disease process, condition or treatment will be avoided or minimized Outcome: Progressing Goal: Diagnostic test results will improve Outcome: Progressing   Problem: Safety: Goal: Verbalization of understanding the information provided will improve Outcome: Progressing   Problem: Self-Concept: Goal: Level of anxiety will decrease Outcome: Progressing Goal: Ability to verbalize feelings about condition will improve Outcome: Progressing   Problem: Fluid Volume: Goal: Hemodynamic stability will improve Outcome: Progressing   Problem: Clinical Measurements: Goal: Diagnostic test results will improve Outcome: Progressing Goal: Signs and symptoms of infection will decrease Outcome: Progressing   Problem: Respiratory: Goal: Ability to maintain adequate ventilation will improve Outcome: Progressing

## 2019-12-24 NOTE — Procedures (Addendum)
Patient Name: Ronald Wolf  MRN: [id number]  Epilepsy Attending: [name]  Referring Physician/Provider: Dr Remi Haggard A[place]or Duration: 12/23/2019 1440 to 12/24/2019 1303  Patient history: 74yo M with chronic L MCA infarct who presented with aphasia. EEG to evaluate for seizure.   Level of alertness: awake, asleep  AEDs during EEG study: LEV  Technical aspects: This EEG study was done with scalp electrodes positioned according to the 10-20 International system of electrode placement. Electrical activity was acquired at a sampling rate of 500Hz  and reviewed with a high frequency filter of 70Hz  and a low frequency filter of 1Hz . EEG data were recorded continuously and digitally stored.   BACKGROUND ACTIVITY: The posterior dominant rhythm consists of 10 Hz activity of moderate voltage (25-35 uV) seen predominantly in posterior head regions, symmetric and reactive to eye opening and eye closing.   Sleep was characterized by vertex waves, sleep spindles (12 to 14 Hz), maximal frontocentral.  EEG showed continuous 3 to 5 Hz slowing as well as intermittent rhythmic 2 to 3 Hz delta slowing in left frontotemporal.  Sharp waves were also seen in left frontotemporal region. Hyperventilation and photic stimulation were not performed.         Abnormality -Continuous slow, left frontotemporal -Intermittent rhythmic delta slow, left frontotemporal -Sharp waves, left frontotemporal  IMPRESSION: This study showed evidence of epileptogenicity and cortical dysfunction in left frontotemporal region likely secondary to underlying encephalomalacia. No definite seizures were seen throughout the recording.  [name] [name]

## 2019-12-24 NOTE — Progress Notes (Signed)
Inpatient Rehab Admissions:  Inpatient Rehab Consult received.  I met with pt at the bedside for rehabilitation assessment. Pt following minimal verbal commands and unable to provide any PLOF. Anticipate pt will need 24/7 Supervision at DC. I have left a voicemail with his family to discuss CIR program and determine if pt has the caregiver support to warrant an IP Rehab stay.  Will follow up once I have heard from family. I have notified TOC team that if pt does note have 24/7 S at DC, he will need SNF placement.   Please call if questions.   Raechel Ache, OTR/L  Rehab Admissions Coordinator  [phone] 12/24/2019 4:07 PM

## 2019-12-24 NOTE — Progress Notes (Signed)
PROGRESS NOTE    Ronald Wolf  [id number] DOB: Mar 11, 1946 DOA: 12/16/2019 PCP: Mosie Lukes, MD   Brief Narrative: Ronald Wolf is a 74 y.o. male with PMH of anxiety, depression, GERD, hepatitis C, hypertension, hyperlipidemia, trigeminal neuralgia s/p, knife treatment, CVA (left MCA), alcohol dependence in remission, vascular dementia, seizure disorder post stroke not on medications PTA, s/p urostomy. Patient presented after being found unresponsive with concern for seizures. Patient's hospitalization complicated by sepsis secondary to UTI.   Assessment & Plan:   Principal Problem:   Acute metabolic encephalopathy Active Problems:   HTN (hypertension)   Hyperglycemia   Seizure (HCC)   Polysubstance abuse (HCC)   History of stroke   Acute metabolic encephalopathy Likely multifactorial. Patient has not improved too much with regard to mental status but is more interactive than what is noted in chart review. MRI negative for acute process. EEG was not significant for noted seizures.  Seizures EEG consistent with epileptogenicity and cortical dysfunction in left frontotemporal region likely secondary to underlying encephalomalacia from prior CVA along with diffuse encephalopathic changes with no definite seizures. Patient started on [place] -Neurology recommendations: LT EEG  Sepsis Secondary to UTI. Physiology resolved. Vitals stable.  UTI In setting of urostomy. Patient managed on Ceftriaxone initially, which was switched to vancomycin and zosyn. Blood cultures negative. Urine cultures significant for (12/17/2019) multiple specie and (12/18/2019) no growth. Discontinue Zosyn today.  Concern for dysphagia Evaluated by speech therapy and is on a dysphagia diet  History of left MCA stroke No acute stroke noted on recent MRI -Continue Lipitor, Plavix and aspirin  Hyperlipidemia -Continue Lipitor  Essential hypertension -Continue amlodipine and  Coreg  Hypernatremia Possibly secondary to dehydration. Improved with IV fluids.  Tobacco use -Continue nicotine patch  Alcohol abuse -Continue multivitamin, thiamine and folate  S/p urostomy  DVT prophylaxis: Lovenox Code Status:   Code Status: Full Code Family Communication: None at bedside Disposition Plan: Discharge pending possible clinical improvement; PT recommending CIR   Consultants:   Neurology  Procedures:   1/4: EEG Description: During awake state, no clear posterior dominant rhythm was seen.  EEG showed continuous generalized, maximal left frontotemporal 3 to 5 Hz slowing.  Sharp waves were also seen in left frontotemporal region.  Hyperventilation and photic stimulation were not performed.  Abnormality -Continuous slow, generalized, maximal left frontotemporal -Sharp waves, left frontotemporal  IMPRESSION: This study showed evidence of epileptogenicity and cortical dysfunction in left frontotemporal region likely secondary to underlying encephalomalacia.  Additionally there is evidence of mild to moderate diffuse encephalopathy, nonspecific etiology.  No seizures were seen throughout the recording.   1/5: EEG Description: During awake state, no clear posterior dominant rhythm was seen. EEG showed continuous generalized, maximal left frontotemporal 3 to 5 Hz slowing. Sharp waves were also seen in left frontotemporal region. Hyperventilation and photic stimulation were not performed.  Abnormality -Continuous slow, generalized, maximal left frontotemporal -Sharp waves, left frontotemporal  IMPRESSION: This study showed evidence of epileptogenicity and cortical dysfunction in left frontotemporal region likely secondary to underlying encephalomalacia. Additionally there is evidence of mild to moderate diffuse encephalopathy, nonspecific etiology. No seizures were seen throughout the recording.  EEG is similar to previous study performed  yesterday.  Antimicrobials:  Vancomycin  Ceftriaxone  Zosyn    Subjective: Aphasia makes it difficult for him to communicate.  Objective: Vitals:   12/24/19 0354 12/24/19 0747 12/24/19 0929 12/24/19 1134  BP: 139/81 (!) 154/72 (!) 151/77 (!) 141/81  Pulse: 93 85  97 67  Resp: (!) 22 20  20   Temp: 98.5 F (36.9 C) 99.1 F (37.3 C)  98.5 F (36.9 C)  TempSrc: Oral Oral  Oral  SpO2: 98% 98%  98%  Weight:      Height:        Intake/Output Summary (Last 24 hours) at 12/24/2019 1203 Last data filed at 12/24/2019 0400 Gross per 24 hour  Intake 270 ml  Output 1250 ml  Net -980 ml   Filed Weights   12/16/19 1559 12/17/19 1950  Weight: 70.6 kg 62.2 kg    Examination:  General exam: Appears calm and comfortable Respiratory system: Clear to auscultation. Respiratory effort normal. Cardiovascular system: S1 & S2 heard, RRR. No murmurs, rubs, gallops or clicks. Gastrointestinal system: Abdomen is nondistended, soft and nontender. No organomegaly or masses felt. Normal bowel sounds heard. Central nervous system: Alert. Aphasia. Follows simple commands. Extremities: No edema. No calf tenderness Skin: No cyanosis. No rashes Psychiatry: Mood & affect appropriate.     Data Reviewed: I have personally reviewed following labs and imaging studies  CBC: Recent Labs  Lab 12/18/19 0331 12/19/19 0224 12/20/19 0546 12/21/19 0439 12/23/19 0431  WBC 17.4* 16.2* 13.0* 12.9* 10.2  NEUTROABS  --  11.8*  --  8.4*  --   HGB 13.5 13.6 11.5* 12.8* 11.5*  HCT 42.5 42.5 34.9* 38.5* 35.0*  MCV 109.8* 108.7* 106.7* 104.6* 105.7*  PLT 254 219 258 261 [id number]   Basic Metabolic Panel: Recent Labs  Lab 12/18/19 0331 12/19/19 0224 12/20/19 0546 12/21/19 0439  NA 148* 147* 146* 140  K 3.6 2.7* 2.8* 3.8  CL 110 115* 115* 110  CO2 24 19* 22 19*  GLUCOSE 101* 80 118* 98  BUN 15 21 19 15   CREATININE 1.03 1.10 1.14 0.87  CALCIUM 9.3 8.6* 8.5* 8.5*  MG  --  1.8  --  1.9  PHOS  --   --    --  1.5*   GFR: Estimated Creatinine Clearance: 66.5 mL/min (by C-G formula based on SCr of 0.87 mg/dL). Liver Function Tests: Recent Labs  Lab 12/21/19 0439  ALBUMIN 2.5*   No results for input(s): LIPASE, AMYLASE in the last 168 hours. No results for input(s): AMMONIA in the last 168 hours. Coagulation Profile: No results for input(s): INR, PROTIME in the last 168 hours. Cardiac Enzymes: No results for input(s): CKTOTAL, CKMB, CKMBINDEX, TROPONINI in the last 168 hours. BNP (last 3 results) No results for input(s): PROBNP in the last 8760 hours. HbA1C: No results for input(s): HGBA1C in the last 72 hours. CBG: No results for input(s): GLUCAP in the last 168 hours. Lipid Profile: No results for input(s): CHOL, HDL, LDLCALC, TRIG, CHOLHDL, LDLDIRECT in the last 72 hours. Thyroid Function Tests: No results for input(s): TSH, T4TOTAL, FREET4, T3FREE, THYROIDAB in the last 72 hours. Anemia Panel: No results for input(s): VITAMINB12, FOLATE, FERRITIN, TIBC, IRON, RETICCTPCT in the last 72 hours. Sepsis Labs: Recent Labs  Lab 12/18/19 1455 12/18/19 1716 12/19/19 1604 12/20/19 0546  PROCALCITON  --   --   --  0.73  LATICACIDVEN 3.5* 3.6* 1.7  --     Recent Results (from the past 240 hour(s))  SARS CORONAVIRUS 2 (TAT 6-24 HRS) Nasopharyngeal Nasopharyngeal Swab     Status: None   Collection Time: 12/16/19  3:55 PM   Specimen: Nasopharyngeal Swab  Result Value Ref Range Status   SARS Coronavirus 2 NEGATIVE NEGATIVE Final    Comment: (NOTE) SARS-CoV-2 target nucleic acids  are NOT DETECTED. The SARS-CoV-2 RNA is generally detectable in upper and lower respiratory specimens during the acute phase of infection. Negative results do not preclude SARS-CoV-2 infection, do not rule out co-infections with other pathogens, and should not be used as the sole basis for treatment or other patient management decisions. Negative results must be combined with clinical  observations, patient history, and epidemiological information. The expected result is Negative. Fact Sheet for Patients: SugarRoll.be Fact Sheet for Healthcare Providers: https://www.woods-mathews.com/ This test is not yet approved or cleared by the [place] FDA and  has been authorized for detection and/or diagnosis of SARS-CoV-2 by FDA under an Emergency Use Authorization (EUA). This EUA will remain  in effect (meaning this test can be used) for the duration of the COVID-19 declaration under Section 56 4(b)(1) of the Act, 21 U.S.C. section 360bbb-3(b)(1), unless the authorization is terminated or revoked sooner. Performed at [facility], [place] [address], [place], [place] [address]   Culture, Urine     Status: Abnormal   Collection Time: 12/17/19  3:43 PM   Specimen: Urine, Random  Result Value Ref Range Status   Specimen Description URINE, RANDOM  Final   Special Requests   Final    NONE Performed at [facility], [place] [address] [place], [place] [address]    Culture MULTIPLE SPECIES PRESENT, SUGGEST RECOLLECTION (A)  Final   Report Status 12/18/2019 FINAL  Final  Culture, blood (routine x 2)     Status: None   Collection Time: 12/17/19  8:58 PM   Specimen: BLOOD  Result Value Ref Range Status   Specimen Description BLOOD LEFT ANTECUBITAL  Final   Special Requests   Final    BOTTLES DRAWN AEROBIC ONLY Blood Culture results may not be optimal due to an inadequate volume of blood received in culture bottles   Culture   Final    NO GROWTH 5 DAYS Performed at [facility], [place] [address] [place], [place] [address]    Report Status 12/22/2019 FINAL  Final  Culture, blood (routine x 2)     Status: None   Collection Time: 12/17/19  9:17 PM   Specimen: BLOOD LEFT HAND  Result Value Ref Range Status   Specimen Description BLOOD LEFT HAND  Final   Special Requests   Final    BOTTLES DRAWN AEROBIC ONLY Blood Culture  results may not be optimal due to an inadequate volume of blood received in culture bottles   Culture   Final    NO GROWTH 5 DAYS Performed at [facility], [place] [address] [place], [place] [address]    Report Status 12/22/2019 FINAL  Final  MRSA PCR Screening     Status: None   Collection Time: 12/18/19  8:38 AM   Specimen: Nasopharyngeal  Result Value Ref Range Status   MRSA by PCR NEGATIVE NEGATIVE Final    Comment:        The GeneXpert MRSA Assay (FDA approved for NASAL specimens only), is one component of a comprehensive MRSA colonization surveillance program. It is not intended to diagnose MRSA infection nor to guide or monitor treatment for MRSA infections. Performed at [facility], [place] [address] [place], [place] [address]   Culture, Urine     Status: None   Collection Time: 12/18/19  4:48 PM   Specimen: Urine, Clean Catch  Result Value Ref Range Status   Specimen Description URINE, CLEAN CATCH  Final  Special Requests NONE  Final   Culture   Final    NO GROWTH Performed at [facility], [place] [address], [place], [place] [address]    Report Status 12/19/2019 FINAL  Final         Radiology Studies: No results found.      Scheduled Meds: . amLODipine  10 mg Oral Daily  . aspirin  81 mg Oral Daily  . atorvastatin  80 mg Oral q1800  . carvedilol  3.125 mg Oral BID WC  . clopidogrel  75 mg Oral Daily  . enoxaparin (LOVENOX) injection  40 mg Subcutaneous Q24H  . feeding supplement (ENSURE ENLIVE)  237 mL Oral BID BM  . folic acid  1 mg Oral Daily  . multivitamin with minerals  1 tablet Oral Daily  . nicotine  21 mg Transdermal Daily  . thiamine  100 mg Oral Daily   Or  . thiamine  100 mg Intravenous Daily   Continuous Infusions: . levETIRAcetam 500 mg (12/24/19 0950)     LOS: 8 days     Cordelia Poche, MD [facility] 12/24/2019, 12:03 PM  If 7PM-7AM, please contact night-coverage [email or web address]

## 2019-12-24 NOTE — Progress Notes (Signed)
LTM EEG discontinued - no skin breakdown at unhook.   

## 2019-12-24 NOTE — Progress Notes (Signed)
CSW acknowledging consult for substance abuse resources. CSW has been following to assess and offer resources when appropriate, but patient has consistently remained altered, only oriented to self. Patient is inappropriate for substance abuse counseling and assessment at this time.   Please consult CSW again if patient's capacity improves and he can appropriately engage in assessment. For now, CSW signing off.  Laveda Abbe, [place] Clinical Social Worker [phone]

## 2019-12-24 NOTE — Progress Notes (Signed)
LTM maint complete - no skin breakdown under:  T4, f7, f8. t3

## 2019-12-25 DIAGNOSIS — G9341 Metabolic encephalopathy: Secondary | ICD-10-CM

## 2019-12-25 MED ORDER — LEVETIRACETAM 750 MG PO TABS
750.0000 mg | ORAL_TABLET | Freq: Two times a day (BID) | ORAL | Status: DC
  Administered 2019-12-25 – 2019-12-30 (×12): 750 mg via ORAL
  Filled 2019-12-25 (×12): qty 1

## 2019-12-25 NOTE — Progress Notes (Signed)
Subjective: No acute events overnight.  Patient appears to be more conversational today.  Denies any concerns.   ROS: Unable to obtain due to poor mental status, aphasia  Examination  Vital signs in last 24 hours: Temp:  [97.7 F (36.5 C)-99.4 F (37.4 C)] 97.7 F (36.5 C) (01/12 0837) Pulse Rate:  [57-102] 75 (01/12 0837) Resp:  [20-31] 20 (01/12 0837) BP: (118-159)/(64-87) 131/69 (01/12 0837) SpO2:  [98 %-100 %] 99 % (01/12 0837)  General: lying in bed, NAD CVS: pulse-normal rate and rhythm RS: breathing comfortably Extremities: normal   Neuro: MS: Awake, alert, follows commands intermittently like raised his arm but attempted to raise left arm when asked to raise right arm, did not wiggle his toes.  Answered questions appropriately at times but mostly monosyllable responses and significant lag between asking the question and getting a response.   CN: pupils equal and reactive, able to track examiner in the room, no apparent facial asymmetry, rest of the cranial nerves difficult to examine as patient does not consistently follow commands  Motor: Antigravity strength in all 4 extremities L>R  Basic Metabolic Panel: Recent Labs  Lab 12/19/19 0224 12/20/19 0546 12/21/19 0439  NA 147* 146* 140  K 2.7* 2.8* 3.8  CL 115* 115* 110  CO2 19* 22 19*  GLUCOSE 80 118* 98  BUN 21 19 15   CREATININE 1.10 1.14 0.87  CALCIUM 8.6* 8.5* 8.5*  MG 1.8  --  1.9  PHOS  --   --  1.5*    CBC: Recent Labs  Lab 12/19/19 0224 12/20/19 0546 12/21/19 0439 12/23/19 0431  WBC 16.2* 13.0* 12.9* 10.2  NEUTROABS 11.8*  --  8.4*  --   HGB 13.6 11.5* 12.8* 11.5*  HCT 42.5 34.9* 38.5* 35.0*  MCV 108.7* 106.7* 104.6* 105.7*  PLT 219 258 261 276     Coagulation Studies: No results for input(s): LABPROT, INR in the last 72 hours.  Imaging MRI brain without contrast 12/16/2019: Extensive old infarction in the left MCA territory. No sign of acute or subacute infarction. 2 small foci of  restricted diffusion within the regions of old infarction in the left posterior frontal and parietal region not felt to relate to acute insult. Hemosiderin deposition in the regions of the old left MCA infarction.  ASSESSMENT AND PLAN: 74 year old male with previous left MCA infarct who presented on 12/16/2019 for altered mental status.  He had an EEG which showed left frontotemporal sharp waves.  Therefore patient was started on Keppra.  Infectious work-up was negative.  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  Recommendations -Continue Keppra 750 mg twice daily   -Continue aspirin, Plavix, statin for secondary stroke prevention -Continue seizure precautions, avoid hypotension, minimize sedating medications -As needed IV Ativan 2 mg for generalized tonic-clonic seizure lasting more than 2 minutes or focal seizure lasting more than 5 minutes while inpatient -Neurology follow-up in 6 to 8 weeks after discharge for seizure and  vascular surgery consult as an outpatient for bilateral carotid stenosisfor  Thank you for allowing [place] to participate in the care of this patient.  Neurology will sign off. Please page neuro hospitalist for further questions after 5 PM.   I have spent a total of25 minuteswith the patient reviewing hospitalnotes,  test results, labs and examining the patient as well as establishing an assessment and plan that was  discussed personally with the patient and the team.>50% of time was spent in direct patient care.

## 2019-12-25 NOTE — Progress Notes (Signed)
Occupational Therapy Treatment Patient Details Name: Ronald Wolf MRN: [id number] DOB: 23-Sep-1946 Today's Date: 12/25/2019    History of present illness 74 year old male with PMH of anxiety, depression, hepatitis C, hypertension, hyperlipidemia, trigeminal neuralgia s/p knife treatment, CVA (left MCA), alcohol dependence in remission, vascular dementia, seizure disorder post stroke not on medications PTA, s/p urostomy, found by family unresponsive at 1 PM on 1/3. Acute stroke ruled out by MRI.  Admitted for suspected seizures, confirmed by EEG. On 1/5, treated with aggressive IVF per sepsis protocol, developed congested cough and IV changed to NSL.   OT comments  Upon OT arrival, pt asleep in bed, pleasantly awoke to therapist. Pt was soiled and required modA to roll R<>L and totalA for pericare. Pt required modA to progress trunk to upright posture sitting EOB. He washed his face after visual cue, when provided with a toothbrush, pt attempted to brush eye despite visual cue. Pt followed simple one step commands <50% of the time with visual or gestural cues. He scooted laterally toward [facility] with minguard assistance after gestural cue. Pt visually attends to therapist in L and R visual fields when tracking from left field into right field, but increased difficulty to spontaneously locate object in R visual field. Pt will continue to benefit from skilled OT services to maximize safety and independence with ADL/IADL and functional mobility. Will continue to follow acutely and progress as tolerated.    Follow Up Recommendations  SNF    Equipment Recommendations  3 in 1 bedside commode    Recommendations for Other Services      Precautions / Restrictions Precautions Precautions: Fall Precaution Comments: seizure Restrictions Weight Bearing Restrictions: No       Mobility Bed Mobility Overal bed mobility: Needs Assistance Bed Mobility: Supine to Sit;Sit to Supine;Rolling Rolling: Min  assist   Supine to sit: HOB elevated;Mod assist Sit to supine: Min assist   General bed mobility comments: modA to progress trunk to upright posture;  Transfers Overall transfer level: Needs assistance   Transfers: Lateral/Scoot Transfers          Lateral/Scoot Transfers: Min guard General transfer comment: lateral scoot towards HOB to the R x8 after gestural cue    Balance Overall balance assessment: Needs assistance Sitting-balance support: Single extremity supported;Feet supported Sitting balance-Leahy Scale: Fair Sitting balance - Comments: reliant on at least single UE support on external surface                                   ADL either performed or assessed with clinical judgement   ADL Overall ADL's : Needs assistance/impaired     Grooming: Moderate assistance Grooming Details (indicate cue type and reason): modA with visual cue to initiate washing face                 Toilet Transfer: Moderate assistance Toilet Transfer Details (indicate cue type and reason): modA to roll R<>L for pericare;pt soiled upon arrival Toileting- Clothing Manipulation and Hygiene: Total assistance       Functional mobility during ADLs: Moderate assistance General ADL Comments: pt soiled upon arrival required modA -total A for posterior pericare;while grooming sitting EOB pt initiated use of RUE to wash face;     Vision   Vision Assessment?: Vision impaired- to be further tested in functional context Additional Comments: tracks therapist horizontally and vertically in the room;decreased visual attention to smaller stimulus (pen);decreased ability to maintain  focus outside of midline, requires increased time and cues to track to the R    Perception     Praxis      Cognition Arousal/Alertness: Awake/alert Behavior During Therapy: Flat affect Overall Cognitive Status: No family/caregiver present to determine baseline cognitive functioning                                  General Comments: pt with minimal communication during session, did not respond to therapist's questions        Exercises     Shoulder Instructions       General Comments vital within normal range    Pertinent Vitals/ Pain       Pain Assessment: Faces Faces Pain Scale: No hurt Pain Intervention(s): Monitored during session  Home Living                                          Prior Functioning/Environment              Frequency  Min 2X/week        Progress Toward Goals  OT Goals(current goals can now be found in the care plan section)  Progress towards OT goals: Progressing toward goals  Acute Rehab OT Goals Patient Stated Goal: pt did not state OT Goal Formulation: Patient unable to participate in goal setting Time For Goal Achievement: 01/02/20 Potential to Achieve Goals: Good ADL Goals Pt Will Perform Eating: with supervision;sitting Pt Will Perform Grooming: with supervision;sitting Pt Will Transfer to Toilet: ambulating;with supervision Additional ADL Goal #1: Pt will follow multi-step commands consistently during ADL.  Plan Discharge plan needs to be updated    Co-evaluation                 AM-PAC OT "6 Clicks" Daily Activity     Outcome Measure   Help from another person eating meals?: A Lot Help from another person taking care of personal grooming?: A Lot Help from another person toileting, which includes using toliet, bedpan, or urinal?: A Lot Help from another person bathing (including washing, rinsing, drying)?: A Lot Help from another person to put on and taking off regular upper body clothing?: A Lot Help from another person to put on and taking off regular lower body clothing?: A Lot 6 Click Score: 12    End of Session    OT Visit Diagnosis: Unsteadiness on feet (R26.81);Other abnormalities of gait and mobility (R26.89);Muscle weakness (generalized) (M62.81);Feeding difficulties  (R63.3);Other symptoms and signs involving cognitive function   Activity Tolerance Patient tolerated treatment well   Patient Left in bed;with call bell/phone within reach;with bed alarm set;with nursing/sitter in room(NT arrived for vitals)   Nurse Communication Mobility status        Time: [id number] OT Time Calculation (min): 30 min  Charges: OT General Charges $OT Visit: 1 Visit OT Treatments $Self Care/Home Management : 23-37 mins  Dorinda Hill OTR/L Acute Rehabilitation Services Office: [place] 12/25/2019, 4:51 PM

## 2019-12-25 NOTE — Progress Notes (Signed)
Inpatient Rehabilitation-Admissions Coordinator   Spoke with pt's sister via phone to discuss CIR program and determine if our program is appropriate for his post acute rehab. Based on my assessment, the pt will need 24/7 S at DC based on cognitive deficits. Per his sister, the pt was needing more assistance at home PTA than he was getting from family and friends. Pt's sister in agreement that a short term intensive rehab may not be the best option for him and she is in agreement for a longer rehab program. Due to lack of caregiver support at DC, recommend SNF placement for post acute rehab.   I have notified TOC team regarding recommendation.   AC will sign off.   Raechel Ache, OTR/L  Rehab Admissions Coordinator  [phone] 12/25/2019 10:38 AM

## 2019-12-25 NOTE — Progress Notes (Signed)
PROGRESS NOTE    Ronald Wolf  [id number] DOB: 1946/02/05 DOA: 12/16/2019 PCP: Mosie Lukes, MD   Brief Narrative: Ronald Wolf is a 74 y.o. male with PMH of anxiety, depression, GERD, hepatitis C, hypertension, hyperlipidemia, trigeminal neuralgia s/p, knife treatment, CVA (left MCA), alcohol dependence in remission, vascular dementia, seizure disorder post stroke not on medications PTA, s/p urostomy. Patient presented after being found unresponsive with concern for seizures. Patient's hospitalization complicated by sepsis secondary to UTI.   Assessment & Plan:   Principal Problem:   Acute metabolic encephalopathy Active Problems:   HTN (hypertension)   Hyperglycemia   Seizure (HCC)   Polysubstance abuse (HCC)   History of stroke   Acute metabolic encephalopathy Likely multifactorial. Patient has not improved too much with regard to mental status but is more interactive than what is noted in chart review. MRI negative for acute process. EEG was not significant for noted seizures.  Seizures EEG consistent with epileptogenicity and cortical dysfunction in left frontotemporal region likely secondary to underlying encephalomalacia from prior CVA along with diffuse encephalopathic changes with no definite seizures. Patient started on Keppra -Continue Keppra 750 mg BID -Neuro recommendations: As needed IV Ativan 2 mg for generalized tonic-clonic seizure lasting more than 2 minutes or focal seizure lasting more than 5 minutes while inpatient; outpatient follow-up 6-8 weeks after discharge; recommending vascular surgery consult for bilateral carotid stenosis (vas [place] from 2020 shows 1-39% bilaterally).  Aphasia Thought to be secondary to hypoperfusion/seizure. Symptoms improved today. Neurology signed off.  Sepsis Secondary to UTI. Physiology resolved. Vitals stable.  UTI In setting of urostomy. Patient managed on Ceftriaxone initially, which was switched to vancomycin and zosyn.  Blood cultures negative. Urine cultures significant for (12/17/2019) multiple specie and (12/18/2019) no growth. Completed treatment of Zosyn  Concern for dysphagia Evaluated by speech therapy and is on a dysphagia diet  History of left MCA stroke No acute stroke noted on recent MRI -Continue Lipitor, Plavix and aspirin  Hyperlipidemia -Continue Lipitor  Essential hypertension -Continue amlodipine and Coreg  Hypernatremia Possibly secondary to dehydration. Improved with IV fluids.  Tobacco use -Continue nicotine patch  Alcohol abuse -Continue multivitamin, thiamine and folate  S/p urostomy  DVT prophylaxis: Lovenox Code Status:   Code Status: Full Code Family Communication: None at bedside Disposition Plan: Discharge to SNF when bed available   Consultants:   Neurology  Procedures:   1/4: EEG Description: During awake state, no clear posterior dominant rhythm was seen.  EEG showed continuous generalized, maximal left frontotemporal 3 to 5 Hz slowing.  Sharp waves were also seen in left frontotemporal region.  Hyperventilation and photic stimulation were not performed.  Abnormality -Continuous slow, generalized, maximal left frontotemporal -Sharp waves, left frontotemporal  IMPRESSION: This study showed evidence of epileptogenicity and cortical dysfunction in left frontotemporal region likely secondary to underlying encephalomalacia.  Additionally there is evidence of mild to moderate diffuse encephalopathy, nonspecific etiology.  No seizures were seen throughout the recording.   1/5: EEG Description: During awake state, no clear posterior dominant rhythm was seen. EEG showed continuous generalized, maximal left frontotemporal 3 to 5 Hz slowing. Sharp waves were also seen in left frontotemporal region. Hyperventilation and photic stimulation were not performed.  Abnormality -Continuous slow, generalized, maximal left frontotemporal -Sharp waves, left  frontotemporal  IMPRESSION: This study showed evidence of epileptogenicity and cortical dysfunction in left frontotemporal region likely secondary to underlying encephalomalacia. Additionally there is evidence of mild to moderate diffuse encephalopathy, nonspecific etiology. No seizures  were seen throughout the recording.  EEG is similar to previous study performed yesterday.  Antimicrobials:  Vancomycin  Ceftriaxone  Zosyn    Subjective: Patient reports no issues today.  Objective: Vitals:   12/25/19 0404 12/25/19 0406 12/25/19 0837 12/25/19 1229  BP:   131/69 (!) 150/71  Pulse:   75 79  Resp: (!) 25 (!) 21 20 20   Temp:   97.7 F (36.5 C) 98.5 F (36.9 C)  TempSrc:   Oral Oral  SpO2:   99% 100%  Weight:      Height:        Intake/Output Summary (Last 24 hours) at 12/25/2019 1345 Last data filed at 12/24/2019 2320 Gross per 24 hour  Intake 300.06 ml  Output 200 ml  Net 100.06 ml   Filed Weights   12/16/19 1559 12/17/19 1950  Weight: 70.6 kg 62.2 kg    Examination:  General exam: Appears calm and comfortable Respiratory system: Clear to auscultation. Respiratory effort normal. Cardiovascular system: S1 & S2 heard, RRR. No murmurs, rubs, gallops or clicks. Gastrointestinal system: Abdomen is nondistended, soft and nontender. No organomegaly or masses felt. Normal bowel sounds heard. Central nervous system: Alert. Answers some questions. Follows minimal commands. Extremities: No edema. No calf tenderness Skin: No cyanosis. No rashes Psychiatry: Judgement and insight appear impaired.     Data Reviewed: I have personally reviewed following labs and imaging studies  CBC: Recent Labs  Lab 12/19/19 0224 12/20/19 0546 12/21/19 0439 12/23/19 0431  WBC 16.2* 13.0* 12.9* 10.2  NEUTROABS 11.8*  --  8.4*  --   HGB 13.6 11.5* 12.8* 11.5*  HCT 42.5 34.9* 38.5* 35.0*  MCV 108.7* 106.7* 104.6* 105.7*  PLT 219 258 261 [id number]   Basic Metabolic Panel: Recent Labs   Lab 12/19/19 0224 12/20/19 0546 12/21/19 0439  NA 147* 146* 140  K 2.7* 2.8* 3.8  CL 115* 115* 110  CO2 19* 22 19*  GLUCOSE 80 118* 98  BUN 21 19 15   CREATININE 1.10 1.14 0.87  CALCIUM 8.6* 8.5* 8.5*  MG 1.8  --  1.9  PHOS  --   --  1.5*   GFR: Estimated Creatinine Clearance: 66.5 mL/min (by C-G formula based on SCr of 0.87 mg/dL). Liver Function Tests: Recent Labs  Lab 12/21/19 0439  ALBUMIN 2.5*   No results for input(s): LIPASE, AMYLASE in the last 168 hours. No results for input(s): AMMONIA in the last 168 hours. Coagulation Profile: No results for input(s): INR, PROTIME in the last 168 hours. Cardiac Enzymes: No results for input(s): CKTOTAL, CKMB, CKMBINDEX, TROPONINI in the last 168 hours. BNP (last 3 results) No results for input(s): PROBNP in the last 8760 hours. HbA1C: No results for input(s): HGBA1C in the last 72 hours. CBG: No results for input(s): GLUCAP in the last 168 hours. Lipid Profile: No results for input(s): CHOL, HDL, LDLCALC, TRIG, CHOLHDL, LDLDIRECT in the last 72 hours. Thyroid Function Tests: No results for input(s): TSH, T4TOTAL, FREET4, T3FREE, THYROIDAB in the last 72 hours. Anemia Panel: No results for input(s): VITAMINB12, FOLATE, FERRITIN, TIBC, IRON, RETICCTPCT in the last 72 hours. Sepsis Labs: Recent Labs  Lab 12/18/19 1455 12/18/19 1716 12/19/19 1604 12/20/19 0546  PROCALCITON  --   --   --  0.73  LATICACIDVEN 3.5* 3.6* 1.7  --     Recent Results (from the past 240 hour(s))  SARS CORONAVIRUS 2 (TAT 6-24 HRS) Nasopharyngeal Nasopharyngeal Swab     Status: None   Collection Time: 12/16/19  3:55 PM   Specimen: Nasopharyngeal Swab  Result Value Ref Range Status   SARS Coronavirus 2 NEGATIVE NEGATIVE Final    Comment: (NOTE) SARS-CoV-2 target nucleic acids are NOT DETECTED. The SARS-CoV-2 RNA is generally detectable in upper and lower respiratory specimens during the acute phase of infection. Negative results do not  preclude SARS-CoV-2 infection, do not rule out co-infections with other pathogens, and should not be used as the sole basis for treatment or other patient management decisions. Negative results must be combined with clinical observations, patient history, and epidemiological information. The expected result is Negative. Fact Sheet for Patients: SugarRoll.be Fact Sheet for Healthcare Providers: https://www.woods-mathews.com/ This test is not yet approved or cleared by the [place] FDA and  has been authorized for detection and/or diagnosis of SARS-CoV-2 by FDA under an Emergency Use Authorization (EUA). This EUA will remain  in effect (meaning this test can be used) for the duration of the COVID-19 declaration under Section 56 4(b)(1) of the Act, 21 U.S.C. section 360bbb-3(b)(1), unless the authorization is terminated or revoked sooner. Performed at [facility], [place] [address] [place], [place] [address]   Culture, Urine     Status: Abnormal   Collection Time: 12/17/19  3:43 PM   Specimen: Urine, Random  Result Value Ref Range Status   Specimen Description URINE, RANDOM  Final   Special Requests   Final    NONE Performed at [facility], [place] [address], [place], [place] [address]    Culture MULTIPLE SPECIES PRESENT, SUGGEST RECOLLECTION (A)  Final   Report Status 12/18/2019 FINAL  Final  Culture, blood (routine x 2)     Status: None   Collection Time: 12/17/19  8:58 PM   Specimen: BLOOD  Result Value Ref Range Status   Specimen Description BLOOD LEFT ANTECUBITAL  Final   Special Requests   Final    BOTTLES DRAWN AEROBIC ONLY Blood Culture results may not be optimal due to an inadequate volume of blood received in culture bottles   Culture   Final    NO GROWTH 5 DAYS Performed at [facility], [place] [address], [place], [place] [address]    Report Status 12/22/2019 FINAL  Final  Culture, blood (routine x 2)      Status: None   Collection Time: 12/17/19  9:17 PM   Specimen: BLOOD LEFT HAND  Result Value Ref Range Status   Specimen Description BLOOD LEFT HAND  Final   Special Requests   Final    BOTTLES DRAWN AEROBIC ONLY Blood Culture results may not be optimal due to an inadequate volume of blood received in culture bottles   Culture   Final    NO GROWTH 5 DAYS Performed at [facility], [place] [address] [place], [place] [address]    Report Status 12/22/2019 FINAL  Final  MRSA PCR Screening     Status: None   Collection Time: 12/18/19  8:38 AM   Specimen: Nasopharyngeal  Result Value Ref Range Status   MRSA by PCR NEGATIVE NEGATIVE Final    Comment:        The GeneXpert MRSA Assay (FDA approved for NASAL specimens only), is one component of a comprehensive MRSA colonization surveillance program. It is not intended to diagnose MRSA infection nor to guide or monitor treatment for MRSA infections. Performed at [facility], [place] [address] [place], [place] [address]   Culture, Urine     Status: None  Collection Time: 12/18/19  4:48 PM   Specimen: Urine, Clean Catch  Result Value Ref Range Status   Specimen Description URINE, CLEAN CATCH  Final   Special Requests NONE  Final   Culture   Final    NO GROWTH Performed at [facility], [place] [address] [place], [place] [address]    Report Status 12/19/2019 FINAL  Final         Radiology Studies: Overnight EEG with video  Result Date: 12/24/2019 [name], MD     12/24/2019  2:33 PM Patient Name: Ronald Wolf MRN: [id number] Epilepsy Attending: [name] Referring Physician/Provider: Dr Remi Haggard Aroor Duration: 12/23/2019 1440 to 12/24/2019 1303  Patient history: 74yo M with chronic L MCA infarct who presented with aphasia. EEG to evaluate for seizure.  Level of alertness: awake, asleep  AEDs during EEG study: LEV  Technical aspects: This EEG study was done with scalp electrodes positioned according to  the 10-20 International system of electrode placement. Electrical activity was acquired at a sampling rate of 500Hz  and reviewed with a high frequency filter of 70Hz  and a low frequency filter of 1Hz . EEG data were recorded continuously and digitally stored.  BACKGROUND ACTIVITY: The posterior dominant rhythm consists of 10 Hz activity of moderate voltage (25-35 uV) seen predominantly in posterior head regions, symmetric and reactive to eye opening and eye closing.   Sleep was characterized by vertex waves, sleep spindles (12 to 14 Hz), maximal frontocentral.  EEG showed continuous 3 to 5 Hz slowing as well as intermittent rhythmic 2 to 3 Hz delta slowing in left frontotemporal.  Sharp waves were also seen in left frontotemporal region. Hyperventilation and photic stimulation were not performed.        Abnormality -Continuous slow, left frontotemporal -Intermittent rhythmic delta slow, left frontotemporal -Sharp waves, left frontotemporal  IMPRESSION: This study showed evidence of epileptogenicity and cortical dysfunction in left frontotemporal region likely secondary to underlying encephalomalacia. No definite seizures were seen throughout the recording. Priyanka [name]        Scheduled Meds:  amLODipine  10 mg Oral Daily   aspirin  81 mg Oral Daily   atorvastatin  80 mg Oral q1800   carvedilol  3.125 mg Oral BID WC   clopidogrel  75 mg Oral Daily   enoxaparin (LOVENOX) injection  40 mg Subcutaneous Q24H   feeding supplement (ENSURE ENLIVE)  237 mL Oral BID BM   folic acid  1 mg Oral Daily   levETIRAcetam  750 mg Oral BID   multivitamin with minerals  1 tablet Oral Daily   nicotine  21 mg Transdermal Daily   thiamine  100 mg Oral Daily   Or   thiamine  100 mg Intravenous Daily   Continuous Infusions:    LOS: 9 days     Cordelia Poche, MD [facility] 12/25/2019, 1:45 PM  If 7PM-7AM, please contact night-coverage [email or web address]

## 2019-12-26 NOTE — Progress Notes (Signed)
Physical Therapy Treatment Patient Details Name: Ronald Wolf MRN: [id number] DOB: 31-Oct-1946 Today's Date: 12/26/2019    History of Present Illness 74 year old male with PMH of anxiety, depression, hepatitis C, hypertension, hyperlipidemia, trigeminal neuralgia s/p knife treatment, CVA (left MCA), alcohol dependence in remission, vascular dementia, seizure disorder post stroke not on medications PTA, s/p urostomy, found by family unresponsive at 1 PM on 1/3. Acute stroke ruled out by MRI.  Admitted for suspected seizures, confirmed by EEG. On 1/5, treated with aggressive IVF per sepsis protocol, developed congested cough and IV changed to NSL.    PT Comments    Pt supine in bed required max cues for encouragement to participate in session.  After sitting up noted with leakage from urostomy and he was seated in stool.  RN informed of urostomy bag failing and need for replacement.  Pt limited to standing to perform pericare.  Plan next session for progression of gt to tolerance.  He fatigued quickly after lengthy standing trial.      Follow Up Recommendations  CIR;Supervision/Assistance - 24 hour     Equipment Recommendations  (TBA)    Recommendations for Other Services Rehab consult     Precautions / Restrictions Precautions Precautions: Fall Precaution Comments: seizure Restrictions Weight Bearing Restrictions: No    Mobility  Bed Mobility Overal bed mobility: Needs Assistance Bed Mobility: Supine to Sit     Supine to sit: Mod assist     General bed mobility comments: Pt able to advance LE to edge of bed without assistance.  He required use of PTA as a railing to pull his trunk into a seated position.  Transfers Overall transfer level: Needs assistance Equipment used: Rolling walker (2 wheeled) Transfers: Sit to/from Stand Sit to Stand: Min assist         General transfer comment: Cues for hand placement to push from seated surface and rise into  standing.  Ambulation/Gait Ambulation/Gait assistance: Min assist Gait Distance (Feet): 4 Feet Assistive device: Rolling walker (2 wheeled) Gait Pattern/deviations: Step-through pattern;Trunk flexed     General Gait Details: Pt limited to shorter trial as urostomy bag was failing and he was also sitting in a large amount of stool.  Increased time in standing to clean patient of bowel and urinary incontinence.   Stairs             Wheelchair Mobility    Modified Rankin (Stroke Patients Only)       Balance Overall balance assessment: Needs assistance Sitting-balance support: Single extremity supported;Feet supported Sitting balance-Leahy Scale: Fair   Postural control: Right lateral lean   Standing balance-Leahy Scale: Zero                              Cognition Arousal/Alertness: Awake/alert Behavior During Therapy: Flat affect Overall Cognitive Status: No family/caregiver present to determine baseline cognitive functioning                                 General Comments: pt with minimal communication during session, did not respond to therapist's questions.  He would follow commands with increased time as he remains slow to process.      Exercises      General Comments        Pertinent Vitals/Pain Pain Assessment: 0-10 Faces Pain Scale: No hurt    Home Living  Prior Function            PT Goals (current goals can now be found in the care plan section) Acute Rehab PT Goals Patient Stated Goal: pt did not state Potential to Achieve Goals: Fair Progress towards PT goals: Progressing toward goals    Frequency    Min 3X/week      PT Plan Current plan remains appropriate    Co-evaluation              AM-PAC PT "6 Clicks" Mobility   Outcome Measure  Help needed turning from your back to your side while in a flat bed without using bedrails?: A Lot Help needed moving from lying on  your back to sitting on the side of a flat bed without using bedrails?: A Lot Help needed moving to and from a bed to a chair (including a wheelchair)?: A Little Help needed standing up from a chair using your arms (e.g., wheelchair or bedside chair)?: A Little Help needed to walk in hospital room?: A Little Help needed climbing 3-5 steps with a railing? : A Lot 6 Click Score: 15    End of Session Equipment Utilized During Treatment: Gait belt Activity Tolerance: Treatment limited secondary to medical complications (Comment) Patient left: in chair;with call bell/phone within reach;with chair alarm set Nurse Communication: Mobility status PT Visit Diagnosis: Other abnormalities of gait and mobility (R26.89)     Time: 1351-1418 PT Time Calculation (min) (ACUTE ONLY): 27 min  Charges:  $Gait Training: 8-22 mins $Therapeutic Activity: 8-22 mins                     Erasmo Leventhal , PTA Acute Rehabilitation Services Pager [phone] Office [phone]     [place] Dibella Eli Hose 12/26/2019, 2:31 PM

## 2019-12-26 NOTE — Progress Notes (Signed)
Nutrition Follow-up  DOCUMENTATION CODES:   Not applicable  INTERVENTION:  Continue Ensure Enlive po BID, each supplement provides 350 kcal and 20 grams of protein  Encourage adequate PO intake.   NUTRITION DIAGNOSIS:   Inadequate oral intake related to inability to eat as evidenced by NPO status; diet advanced; improving  GOAL:   Patient will meet greater than or equal to 90% of their needs; met  MONITOR:   PO intake, Supplement acceptance, Diet advancement, Skin, Weight trends, Labs, I & O's  REASON FOR ASSESSMENT:   Consult Assessment of nutrition requirement/status  ASSESSMENT:   74 year old male with PMH of anxiety, depression, GERD, hepatitis C, hypertension, hyperlipidemia, trigeminal neuralgia s/p, knife treatment, CVA (left MCA), alcohol dependence in remission, vascular dementia, 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 MRI brain negative for acute stroke.  EEG confirms epileptogenicity and cortical dysfunction in left frontotemporal region.  Pt asleep during time of visit and did not awaken to RD visit. Pt continues on a dysphagia 1 diet with thin liquids. Meal completion has been 25-90%. Pt currently has Ensure ordered and has been consuming them. RD to continue with current orders to aid in caloric and protein needs. Labs and medications reviewed.   Diet Order:   Diet Order            DIET - DYS 1 Room service appropriate? No; Fluid consistency: Thin  Diet effective now              EDUCATION NEEDS:   Not appropriate for education at this time  Skin:  Skin Assessment: Reviewed RN Assessment  Last BM:  1/11  Height:   Ht Readings from Last 1 Encounters:  12/16/19 5' 9"  (1.753 m)    Weight:   Wt Readings from Last 1 Encounters:  12/17/19 62.2 kg    Ideal Body Weight:  72.7 kg  BMI:  Body mass index is 20.25 kg/m.  Estimated Nutritional Needs:   Kcal:  1850-2050  Protein:  85-100  grams  Fluid:  >/= 1.8 L/day    Corrin Parker, MS, RD, LDN Pager # [phone] After hours/ weekend pager # [phone]

## 2019-12-26 NOTE — Progress Notes (Signed)
PROGRESS NOTE  [name] [id number] DOB: 09-19-1946 DOA: 12/16/2019 PCP: Mosie Lukes, MD   LOS: 10 days   Brief narrative: As per HPI,  Ronald Wolf is a 74 y.o. 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 Patient's hospitalization was complicated by sepsis secondary to UTI.  Assessment/Plan:  Principal Problem:   Acute metabolic encephalopathy Active Problems:   HTN (hypertension)   Hyperglycemia   Seizure (HCC)   Polysubstance abuse ([place])   History of stroke  Acute metabolic encephalopathy Metabolic encephalopathy likely multifactorial from sepsis, seizures UTI.  MRI was negative for acute process.  Continue to monitor  Seizures No further seizures.  EEG consistent with epileptogenicity and cortical dysfunction in left frontotemporal region likely secondary to underlying encephalomalacia from prior CVA along with diffuse encephalopathic changes with no definite seizures.  He is currently Keppra 750 mg twice daily and neuro recommends Ativan 2 mg as needed for seizures lasting more than 2 minutes or focal seizure lasting more than 5 minuteswhile inpatient; outpatient follow-up 6-8 weeks after discharge; recommending vascular surgery consult for bilateral carotid stenosis (vascular  ultrasound from 2020 shows stenosis 1-39% bilaterally).   Aphasia Seen by neurology.  Thought to be secondary to hypoperfusion and seizure.  Is mildly verbal today.  Sepsis Secondary to UTI.  Resolved  UTI In setting of urostomy.  Received IV Rocephin initially.  Completed treatment of Zosyn.  Urine culture with no growth  dysphagia Seen by speech therapy.  Recommend dysphagia 1  diet  History of left MCA stroke MRI was negative for acute stroke this time.  Continue Lipitor, Plavix and aspirin  Hyperlipidemia Continue Lipitor  Essential hypertension Continue amlodipine and Coreg.  Blood pressure 129/ 63  Hypernatremia Thought to be secondary to dehydration.  No labs in the last 72 hours.  Tobacco use Continue nicotine patch  History of alcohol abuse continue multivitamin, thiamine and folate.  Currently stable  S/p urostomy.   VTE Prophylaxis: Lovenox subcu  Code Status: Full code  Family Communication: None today.  Disposition Plan: Physical therapy recommend CIR.  Rehabilitation team has seen the patient and recommend skilled nursing facility placement.  Patient understands a few commands but is mostly nonverbal.   Consultants:  Neurology  PMR  Procedures:  EEG  Antibiotics: None currently  Anti-infectives (From admission, onward)   Start     Dose/Rate Route Frequency Ordered Stop   12/19/19 1000  vancomycin (VANCOREADY) IVPB 1250 mg/250 mL  Status:  Discontinued     1,250 mg 166.7 mL/hr over 90 Minutes Intravenous Every 24 hours 12/18/19 0914 12/21/19 1048   12/18/19 1000  piperacillin-tazobactam (ZOSYN) IVPB 3.375 g     3.375 g 12.5 mL/hr over 240 Minutes Intravenous Every 8 hours 12/18/19 0903 12/23/19 2128   12/18/19 0930  vancomycin (VANCOCIN) IVPB 1000 mg/200 mL premix  Status:  Discontinued     1,000 mg 200 mL/hr over 60 Minutes Intravenous  Once 12/18/19 0903 12/18/19 0906   12/18/19 0930  vancomycin (VANCOREADY) IVPB 1250 mg/250 mL     1,250 mg 166.7 mL/hr over 90 Minutes Intravenous  Once 12/18/19 0906 12/18/19 1205   12/17/19 1345  cefTRIAXone (ROCEPHIN) 1 g in sodium chloride 0.9 % 100 mL IVPB  Status:  Discontinued  1 g 200 mL/hr over 30 Minutes Intravenous Every 24 hours 12/17/19 1332 12/18/19 0838     Subjective: Today, patient denies interval complaints.  Understands my questions intermittently but  not very readily verbal.  Objective: Vitals:   12/26/19 1000 12/26/19 1151  BP:  129/63  Pulse:  73  Resp:  20  Temp:  98.6 F (37 C)  SpO2: 98% 98%    Intake/Output Summary (Last 24 hours) at 12/26/2019 1324 Last data filed at 12/26/2019 0900 Gross per 24 hour  Intake 600 ml  Output 1400 ml  Net -800 ml   Filed Weights   12/16/19 1559 12/17/19 1950  Weight: 70.6 kg 62.2 kg   Body mass index is 20.25 kg/m.   Physical Exam: GENERAL: Patient is alert awake.  Has expressive aphasia.  Intermittently understands few commands. Not in obvious distress. HENT: No scleral pallor or icterus. Pupils equally reactive to light. Oral mucosa is moist NECK: is supple, no palpable thyroid enlargement. CHEST: Clear to auscultation. No crackles or wheezes. Non tender on palpation. Diminished breath sounds bilaterally. CVS: S1 and S2 heard, no murmur. Regular rate and rhythm. No pericardial rub. ABDOMEN: Soft, non-tender, bowel sounds are present. EXTREMITIES: No edema. CNS: Expressive aphasia.  Moving all extremities SKIN: warm and dry without rashes.  Data Review: I have personally reviewed the following laboratory data and studies,  CBC: Recent Labs  Lab 12/20/19 0546 12/21/19 0439 12/23/19 0431  WBC 13.0* 12.9* 10.2  NEUTROABS  --  8.4*  --   HGB 11.5* 12.8* 11.5*  HCT 34.9* 38.5* 35.0*  MCV 106.7* 104.6* 105.7*  PLT 258 261 [id number]   Basic Metabolic Panel: Recent Labs  Lab 12/20/19 0546 12/21/19 0439  NA 146* 140  K 2.8* 3.8  CL 115* 110  CO2 22 19*  GLUCOSE 118* 98  BUN 19 15  CREATININE 1.14 0.87  CALCIUM 8.5* 8.5*  MG  --  1.9  PHOS  --  1.5*   Liver Function Tests: Recent Labs  Lab 12/21/19 0439  ALBUMIN 2.5*   No results for input(s): LIPASE, AMYLASE in the last 168 hours. No results for input(s): AMMONIA in the last 168 hours. Cardiac Enzymes: No results for input(s): CKTOTAL, CKMB, CKMBINDEX, TROPONINI in the last 168 hours. BNP (last 3 results) No  results for input(s): BNP in the last 8760 hours.  ProBNP (last 3 results) No results for input(s): PROBNP in the last 8760 hours.  CBG: No results for input(s): GLUCAP in the last 168 hours. Recent Results (from the past 240 hour(s))  SARS CORONAVIRUS 2 (TAT 6-24 HRS) Nasopharyngeal Nasopharyngeal Swab     Status: None   Collection Time: 12/16/19  3:55 PM   Specimen: Nasopharyngeal Swab  Result Value Ref Range Status   SARS Coronavirus 2 NEGATIVE NEGATIVE Final    Comment: (NOTE) SARS-CoV-2 target nucleic acids are NOT DETECTED. The SARS-CoV-2 RNA is generally detectable in upper and lower respiratory specimens during the acute phase of infection. Negative results do not preclude SARS-CoV-2 infection, do not rule out co-infections with other pathogens, and should not be used as the sole basis for treatment or other patient management decisions. Negative results must be combined with clinical observations, patient history, and epidemiological information. The expected result is Negative. Fact Sheet for Patients: SugarRoll.be Fact Sheet for Healthcare Providers: https://www.woods-mathews.com/ This test is not yet approved or cleared by the [place] FDA and  has been authorized for detection and/or diagnosis of SARS-CoV-2 by FDA under an  Emergency Use Authorization (EUA). This EUA will remain  in effect (meaning this test can be used) for the duration of the COVID-19 declaration under Section 56 4(b)(1) of the Act, 21 U.S.C. section 360bbb-3(b)(1), unless the authorization is terminated or revoked sooner. Performed at [facility], [place] [address] [place], [place] [address]   Culture, Urine     Status: Abnormal   Collection Time: 12/17/19  3:43 PM   Specimen: Urine, Random  Result Value Ref Range Status   Specimen Description URINE, RANDOM  Final   Special Requests   Final    NONE Performed at [facility], [place] [address], [place], [place] [address]    Culture MULTIPLE SPECIES PRESENT, SUGGEST RECOLLECTION (A)  Final   Report Status 12/18/2019 FINAL  Final  Culture, blood (routine x 2)     Status: None   Collection Time: 12/17/19  8:58 PM   Specimen: BLOOD  Result Value Ref Range Status   Specimen Description BLOOD LEFT ANTECUBITAL  Final   Special Requests   Final    BOTTLES DRAWN AEROBIC ONLY Blood Culture results may not be optimal due to an inadequate volume of blood received in culture bottles   Culture   Final    NO GROWTH 5 DAYS Performed at [facility], [place] [address] [place], [place] [address]    Report Status 12/22/2019 FINAL  Final  Culture, blood (routine x 2)     Status: None   Collection Time: 12/17/19  9:17 PM   Specimen: BLOOD LEFT HAND  Result Value Ref Range Status   Specimen Description BLOOD LEFT HAND  Final   Special Requests   Final    BOTTLES DRAWN AEROBIC ONLY Blood Culture results may not be optimal due to an inadequate volume of blood received in culture bottles   Culture   Final    NO GROWTH 5 DAYS Performed at [facility], [place] [address] [place], [place] [address]    Report Status 12/22/2019 FINAL  Final  MRSA PCR Screening     Status: None   Collection Time: 12/18/19  8:38 AM   Specimen: Nasopharyngeal  Result Value Ref Range Status   MRSA by PCR NEGATIVE NEGATIVE Final    Comment:        The GeneXpert MRSA Assay (FDA approved for NASAL specimens only), is one component of a comprehensive MRSA colonization surveillance program. It is not intended to diagnose MRSA infection nor to guide or monitor treatment for MRSA infections. Performed at [facility], [place] [address], [place], [place] [address]   Culture, Urine     Status: None   Collection Time: 12/18/19  4:48 PM   Specimen: Urine, Clean Catch  Result Value Ref Range Status   Specimen Description URINE, CLEAN CATCH  Final   Special Requests NONE  Final   Culture   Final    NO  GROWTH Performed at [facility], [place] [address] [place], [place] [address]    Report Status 12/19/2019 FINAL  Final     Studies: Overnight EEG with video  Result Date: 12/24/2019 [name], MD     12/24/2019  2:33 PM Patient Name: Ronald Wolf MRN: [id number] Epilepsy Attending: [name] Referring Physician/Provider: Dr Remi Haggard A[place]or Duration: 12/23/2019 1440 to 12/24/2019 1303  Patient history: 74yo M with chronic L MCA infarct who presented with aphasia. EEG to evaluate for seizure.  Level of alertness:  awake, asleep  AEDs during EEG study: LEV  Technical aspects: This EEG study was done with scalp electrodes positioned according to the 10-20 International system of electrode placement. Electrical activity was acquired at a sampling rate of 500Hz  and reviewed with a high frequency filter of 70Hz  and a low frequency filter of 1Hz . EEG data were recorded continuously and digitally stored.  BACKGROUND ACTIVITY: The posterior dominant rhythm consists of 10 Hz activity of moderate voltage (25-35 uV) seen predominantly in posterior head regions, symmetric and reactive to eye opening and eye closing.   Sleep was characterized by vertex waves, sleep spindles (12 to 14 Hz), maximal frontocentral.  EEG showed continuous 3 to 5 Hz slowing as well as intermittent rhythmic 2 to 3 Hz delta slowing in left frontotemporal.  Sharp waves were also seen in left frontotemporal region. Hyperventilation and photic stimulation were not performed.        Abnormality -Continuous slow, left frontotemporal -Intermittent rhythmic delta slow, left frontotemporal -Sharp waves, left frontotemporal  IMPRESSION: This study showed evidence of epileptogenicity and cortical dysfunction in left frontotemporal region likely secondary to underlying encephalomalacia. No definite seizures were seen throughout the recording. Priyanka [name]    Scheduled Meds: . amLODipine  10 mg Oral Daily  . aspirin  81 mg  Oral Daily  . atorvastatin  80 mg Oral q1800  . carvedilol  3.125 mg Oral BID WC  . clopidogrel  75 mg Oral Daily  . enoxaparin (LOVENOX) injection  40 mg Subcutaneous Q24H  . feeding supplement (ENSURE ENLIVE)  237 mL Oral BID BM  . folic acid  1 mg Oral Daily  . levETIRAcetam  750 mg Oral BID  . multivitamin with minerals  1 tablet Oral Daily  . nicotine  21 mg Transdermal Daily  . thiamine  100 mg Oral Daily   Or  . thiamine  100 mg Intravenous Daily    Continuous Infusions:   Flora Lipps, MD  [facility] 12/26/2019

## 2019-12-26 NOTE — Progress Notes (Signed)
  Speech Language Pathology Treatment: Dysphagia;Cognitive-Linquistic  Patient Details Name: Ronald Wolf MRN: [id number] DOB: 21-May-1946 Today's Date: 12/26/2019 Time: [id number] SLP Time Calculation (min) (ACUTE ONLY): 22 min  Assessment / Plan / Recommendation Clinical Impression  Patient received at bedside, patient alert, visually tracking SLP. Patient stating "Go get the pictures" in response to prompt asking his name. SLP unable to obtain accurate yes/no responsive for verbal simple questions. Patient able to follow 1-step verbal command to "wave" with verbal and visual cueing. Patient unable to consistently follow other 1-step verbal commands. Patient perseverating on word "mix" throughout session. Patient did not demonstrate ability to name items presented in his visual field.   Patient allowed ST to perform oral care with toothbrush via in-line suction. Pt edentulous.  Pt seen with thin liquids via straw and cup sips. Pt able to hold cup with [employer] assistance. No anterior labial spillage, swallow initiation appeared timely. Pt with one instance of delayed cough following straw sip of water. No other s/s aspiration. Pt seen with puree solids (applesauce). Adequate bolus manipulation and swallow. No oral residue observed following the swallow, no overt s/s aspiration. Pt seen with ice chip trials, pt with some attempted mastication/limited bolus manipulation of ice chip. No overt s/s aspiration with ice chip.  Recommend to continue dysphagia 1/thin liquids diet.   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  He reportedly had difficulty talking, worsening right-sided deficits followed by seizures characterized by  facial twitching witnessed in the ER.  Acute stroke ruled out by MRI.  Admitted for suspected seizures, confirmed by EEG.  Neurology consulted.  Hospital course complicated by febrile illness, sepsis due to UTI versus other etiology. On 1/5, treated with aggressive IVF per sepsis protocol, developed congested cough and IV changed to NSL. Patient seen briefly by SLP a year ago (Jan 2020) for speech/language related to left MCA.       SLP Plan  Continue with current plan of care       Recommendations  Diet recommendations: Thin liquid;Dysphagia 1 (puree) Liquids provided via: Cup;Straw Medication Administration: Crushed with puree Supervision: Staff to assist with self feeding;Full supervision/cueing for compensatory strategies Compensations: Minimize environmental distractions;Slow rate;Small sips/bites;Follow solids with liquid Postural Changes and/or Swallow Maneuvers: Seated upright 90 degrees;Upright 30-60 min after meal                Oral Care Recommendations: Oral care BID Follow up Recommendations: Inpatient Rehab SLP Visit Diagnosis: Dysphagia, oral phase (R13.11);Cognitive communication deficit (R41.841) Plan: Continue with current plan of care       [place], M.Ed., CCC-SLP Speech Therapy Acute Rehabilitation [place] 12/26/2019, 10:18 AM

## 2019-12-27 LAB — MAGNESIUM: Magnesium: 2.5 mg/dL — ABNORMAL HIGH (ref 1.7–2.4)

## 2019-12-27 LAB — COMPREHENSIVE METABOLIC PANEL
ALT: 63 U/L — ABNORMAL HIGH (ref 0–44)
AST: 44 U/L — ABNORMAL HIGH (ref 15–41)
Albumin: 2.8 g/dL — ABNORMAL LOW (ref 3.5–5.0)
Alkaline Phosphatase: 92 U/L (ref 38–126)
Anion gap: 9 (ref 5–15)
BUN: 22 mg/dL (ref 8–23)
CO2: 25 mmol/L (ref 22–32)
Calcium: 9 mg/dL (ref 8.9–10.3)
Chloride: 108 mmol/L (ref 98–111)
Creatinine, Ser: 1.02 mg/dL (ref 0.61–1.24)
GFR calc Af Amer: >60 mL/min (ref >60)
GFR calc non Af Amer: >60 mL/min (ref >60)
Glucose, Bld: 98 mg/dL (ref 70–99)
Potassium: 4.3 mmol/L (ref 3.5–5.1)
Sodium: 142 mmol/L (ref 135–145)
Total Bilirubin: 0.7 mg/dL (ref 0.3–1.2)
Total Protein: 6.7 g/dL (ref 6.5–8.1)

## 2019-12-27 LAB — CBC
HCT: 35.8 % — ABNORMAL LOW (ref 39.0–52.0)
Hemoglobin: 11.3 g/dL — ABNORMAL LOW (ref 13.0–17.0)
MCH: 34.8 pg — ABNORMAL HIGH (ref 26.0–34.0)
MCHC: 31.6 g/dL (ref 30.0–36.0)
MCV: 110.2 fL — ABNORMAL HIGH (ref 80.0–100.0)
Platelets: 490 10*3/uL — ABNORMAL HIGH (ref 150–400)
RBC: 3.25 MIL/uL — ABNORMAL LOW (ref 4.22–5.81)
RDW: 13.1 % (ref 11.5–15.5)
WBC: 12.4 10*3/uL — ABNORMAL HIGH (ref 4.0–10.5)
nRBC: 0 % (ref 0.0–0.2)

## 2019-12-27 NOTE — Progress Notes (Signed)
PROGRESS NOTE  Ronald Wolf [id number] DOB: 12-03-1946 DOA: 12/16/2019 PCP: Mosie Lukes, MD   LOS: 11 days   Brief narrative: As per HPI,  Ronald Wolf is a 74 y.o. 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 Patient's hospitalization was complicated by sepsis secondary to UTI.  Assessment/Plan:  Principal Problem:   Acute metabolic encephalopathy Active Problems:   HTN (hypertension)   Hyperglycemia   Seizure (HCC)   Polysubstance abuse ([place])   History of stroke  Acute metabolic encephalopathy Metabolic encephalopathy - multifactorial from sepsis, seizures, UTI.  MRI was negative for acute process.  Continue to monitor.  Seems to have improved slightly.  Seizures No further seizures.  EEG consistent with epileptogenicity and cortical dysfunction in left frontotemporal region likely secondary to underlying encephalomalacia from prior CVA along with diffuse encephalopathic changes with no definite seizures.  Seen by neurology. On Keppra 750 mg twice daily and neuro recommends Ativan 2 mg as needed for seizures lasting more than 2 minutes or focal seizure lasting more than 5 minuteswhile inpatient. Patient will need outpatient neurology follow-up 6-8 weeks after discharge; recommending vascular surgery consult for bilateral carotid stenosis (vascular  ultrasound from 2020 shows stenosis 1-39% bilaterally).   Aphasia Seen by neurology.  Thought to be secondary to hypoperfusion and seizure.  Verbalized some today but answered inappropriately.    Sepsis Secondary to UTI.  Resolved. WBC of 12.4 but no fever.   UTI In setting of urostomy. Completed treatment of Zosyn.  Urine  culture with no growth  dysphagia Seen by speech therapy.  Recommend dysphagia 1 diet.  History of left MCA stroke MRI was negative for acute stroke this time.  Continue Lipitor, Plavix and aspirin  Hyperlipidemia Continue Lipitor  Essential hypertension Continue amlodipine and Coreg.  Blood pressure 129/ 63  Hypernatremia Thought to be secondary to dehydration.  Improved. Na of 142 today.  Tobacco use Continue nicotine patch  History of alcohol abuse continue multivitamin, thiamine and folate.  Currently stable  S/p urostomy.  VTE Prophylaxis: Lovenox subcu  Code Status: Full code  Family Communication:  Unable to reach the patient's daughter and patient's sister on the phone today.   Disposition Plan: Physical therapy recommend CIR.  Rehabilitation team has seen the patient and recommend skilled nursing facility placement.    Consultants:  Neurology  PMR  Procedures:  EEG  Antibiotics: None currently  Anti-infectives (From admission, onward)   Start     Dose/Rate Route Frequency Ordered Stop   12/19/19 1000  vancomycin (VANCOREADY) IVPB 1250 mg/250 mL  Status:  Discontinued     1,250 mg 166.7 mL/hr over 90 Minutes Intravenous Every 24 hours 12/18/19 0914 12/21/19 1048   12/18/19 1000  piperacillin-tazobactam (ZOSYN) IVPB 3.375 g     3.375 g 12.5 mL/hr over 240 Minutes Intravenous Every 8 hours 12/18/19 0903 12/23/19 2128   12/18/19 0930  vancomycin (VANCOCIN) IVPB 1000 mg/200 mL premix  Status:  Discontinued     1,000 mg 200 mL/hr over 60 Minutes Intravenous  Once 12/18/19 0903 12/18/19 0906   12/18/19 0930  vancomycin (VANCOREADY) IVPB 1250 mg/250 mL     1,250 mg 166.7 mL/hr over 90 Minutes Intravenous  Once 12/18/19 0906 12/18/19 1205   12/17/19 1345  cefTRIAXone (  ROCEPHIN) 1 g in sodium chloride 0.9 % 100 mL IVPB  Status:  Discontinued     1 g 200 mL/hr over 30 Minutes Intravenous Every 24 hours 12/17/19 1332 12/18/19 0838      Subjective:  Today, no interval complaints reported.  Patient is being fed with assistance.  Responding with a speech today but noncoherent.  Objective: Vitals:   12/27/19 0300 12/27/19 0317  BP: (!) 135/58   Pulse: 62 69  Resp: (!) 27 15  Temp: (!) 97.4 F (36.3 C)   SpO2: 100% 100%    Intake/Output Summary (Last 24 hours) at 12/27/2019 0754 Last data filed at 12/27/2019 0354 Gross per 24 hour  Intake 240 ml  Output 250 ml  Net -10 ml   Filed Weights   12/16/19 1559 12/17/19 1950  Weight: 70.6 kg 62.2 kg   Body mass index is 20.25 kg/m.   Physical Exam: GENERAL: Patient is alert awake and more responsive,.  Has expressive aphasia.  Intermittently understands few commands. Not in obvious distress. HENT: No scleral pallor or icterus. Pupils equally reactive to light. Oral mucosa is moist NECK: is supple, no palpable thyroid enlargement. CHEST: Clear to auscultation. No crackles or wheezes. Non tender on palpation. Diminished breath sounds bilaterally. CVS: S1 and S2 heard, no murmur. Regular rate and rhythm. No pericardial rub. ABDOMEN: Soft, non-tender, bowel sounds are present. EXTREMITIES: No edema. CNS: Expressive aphasia.  Moving all extremities.  Baseline dementia. SKIN: warm and dry without rashes.  Data Review: I have personally reviewed the following laboratory data and studies,  CBC: Recent Labs  Lab 12/21/19 0439 12/23/19 0431 12/27/19 0548  WBC 12.9* 10.2 12.4*  NEUTROABS 8.4*  --   --   HGB 12.8* 11.5* 11.3*  HCT 38.5* 35.0* 35.8*  MCV 104.6* 105.7* 110.2*  PLT 261 276 123XX123*   Basic Metabolic Panel: Recent Labs  Lab 12/21/19 0439 12/27/19 0548  NA 140 142  K 3.8 4.3  CL 110 108  CO2 19* 25  GLUCOSE 98 98  BUN 15 22  CREATININE 0.87 1.02  CALCIUM 8.5* 9.0  MG 1.9 2.5*  PHOS 1.5*  --    Liver Function Tests: Recent Labs  Lab 12/21/19 0439 12/27/19 0548  AST  --  44*  ALT  --  63*  ALKPHOS  --  92  BILITOT  --  0.7  PROT  --   6.7  ALBUMIN 2.5* 2.8*   No results for input(s): LIPASE, AMYLASE in the last 168 hours. No results for input(s): AMMONIA in the last 168 hours. Cardiac Enzymes: No results for input(s): CKTOTAL, CKMB, CKMBINDEX, TROPONINI in the last 168 hours. BNP (last 3 results) No results for input(s): BNP in the last 8760 hours.  ProBNP (last 3 results) No results for input(s): PROBNP in the last 8760 hours.  CBG: No results for input(s): GLUCAP in the last 168 hours. Recent Results (from the past 240 hour(s))  Culture, Urine     Status: Abnormal   Collection Time: 12/17/19  3:43 PM   Specimen: Urine, Random  Result Value Ref Range Status   Specimen Description URINE, RANDOM  Final   Special Requests   Final    NONE Performed at [facility], [address] [place], [place] [address]    Culture MULTIPLE SPECIES PRESENT, SUGGEST RECOLLECTION (A)  Final   Report Status 12/18/2019 FINAL  Final  Culture, blood (routine x 2)     Status: None   Collection Time:  12/17/19  8:58 PM   Specimen: BLOOD  Result Value Ref Range Status   Specimen Description BLOOD LEFT ANTECUBITAL  Final   Special Requests   Final    BOTTLES DRAWN AEROBIC ONLY Blood Culture results may not be optimal due to an inadequate volume of blood received in culture bottles   Culture   Final    NO GROWTH 5 DAYS Performed at [facility], [place] [address] [place], [place] [address]    Report Status 12/22/2019 FINAL  Final  Culture, blood (routine x 2)     Status: None   Collection Time: 12/17/19  9:17 PM   Specimen: BLOOD LEFT HAND  Result Value Ref Range Status   Specimen Description BLOOD LEFT HAND  Final   Special Requests   Final    BOTTLES DRAWN AEROBIC ONLY Blood Culture results may not be optimal due to an inadequate volume of blood received in culture bottles   Culture   Final    NO GROWTH 5 DAYS Performed at [facility], [place] [address], [place], [place] [address]    Report Status 12/22/2019  FINAL  Final  MRSA PCR Screening     Status: None   Collection Time: 12/18/19  8:38 AM   Specimen: Nasopharyngeal  Result Value Ref Range Status   MRSA by PCR NEGATIVE NEGATIVE Final    Comment:        The GeneXpert MRSA Assay (FDA approved for NASAL specimens only), is one component of a comprehensive MRSA colonization surveillance program. It is not intended to diagnose MRSA infection nor to guide or monitor treatment for MRSA infections. Performed at [facility], [place] [address], [place], [place] [address]   Culture, Urine     Status: None   Collection Time: 12/18/19  4:48 PM   Specimen: Urine, Clean Catch  Result Value Ref Range Status   Specimen Description URINE, CLEAN CATCH  Final   Special Requests NONE  Final   Culture   Final    NO GROWTH Performed at [facility], [place] [address], [place],  [address]    Report Status 12/19/2019 FINAL  Final     Studies: No results found.  Scheduled Meds: . amLODipine  10 mg Oral Daily  . aspirin  81 mg Oral Daily  . atorvastatin  80 mg Oral q1800  . carvedilol  3.125 mg Oral BID WC  . clopidogrel  75 mg Oral Daily  . enoxaparin (LOVENOX) injection  40 mg Subcutaneous Q24H  . feeding supplement (ENSURE ENLIVE)  237 mL Oral BID BM  . folic acid  1 mg Oral Daily  . levETIRAcetam  750 mg Oral BID  . multivitamin with minerals  1 tablet Oral Daily  . nicotine  21 mg Transdermal Daily  . thiamine  100 mg Oral Daily   Or  . thiamine  100 mg Intravenous Daily    Continuous Infusions:   Flora Lipps, MD  [facility] 12/27/2019

## 2019-12-28 MED ORDER — POLYETHYLENE GLYCOL 3350 17 G PO PACK
17.0000 g | PACK | Freq: Every day | ORAL | Status: DC | PRN
  Filled 2019-12-28: qty 1

## 2019-12-28 NOTE — Plan of Care (Signed)
  Problem: Nutrition: Goal: Adequate nutrition will be maintained Outcome: Progressing   

## 2019-12-28 NOTE — Progress Notes (Signed)
PROGRESS NOTE  COEHN BANNER [id number] DOB: [birth year] DOA: 12/16/2019 PCP: Ronald Lukes, MD   LOS: 12 days   Brief narrative: As per HPI,  Ronald Wolf is a 74 y.o. 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 Patient's hospitalization was complicated by sepsis secondary to UTI.  Assessment/Plan:  Principal Problem:   Acute metabolic encephalopathy Active Problems:   HTN (hypertension)   Hyperglycemia   Seizure (HCC)   Polysubstance abuse ([place])   History of stroke  Acute metabolic encephalopathy Metabolic encephalopathy - multifactorial from sepsis, seizures, UTI.  MRI was negative for acute process.  Improved.  Seizures No further seizures.  EEG consistent with epileptogenicity and cortical dysfunction in left frontotemporal region likely secondary to underlying encephalomalacia from prior CVA along with diffuse encephalopathic changes with no definite seizures.  Seen by neurology. On Keppra 750 mg twice daily and neuro recommends Ativan 2 mg as needed for seizures lasting more than 2 minutes or focal seizure lasting more than 5 minuteswhile inpatient. Patient will need outpatient neurology follow-up 6-8 weeks after discharge; recommending vascular surgery consult for bilateral carotid stenosis (vascular  ultrasound from 2020 shows stenosis 1-39% bilaterally).   Aphasia Seen by neurology.  Thought to be secondary to hypoperfusion and seizure.  Verbalized some today but answered inappropriately.    Sepsis Secondary to UTI.  Resolved. WBC of 12.4 on 1/14 but no fever.   UTI In setting of urostomy. Completed treatment of Zosyn.  Urine culture with no growth  dysphagia Seen  by speech therapy.  Recommend dysphagia 1 diet.  History of left MCA stroke MRI was negative for acute stroke this time.  Continue Lipitor, Plavix and aspirin  Hyperlipidemia Continue Lipitor  Essential hypertension Continue amlodipine and Coreg.  Blood pressure is stable.  Hypernatremia Resolved.  Tobacco use Continue nicotine patch  History of alcohol abuse continue multivitamin, thiamine and folate.  Currently stable  S/p urostomy.  VTE Prophylaxis: Lovenox subcu  Code Status: Full code  Family Communication:   I spoke with the patient's sister  Ms Ronald Wolf and updated her about the clinical condition of the patient.  Answered all the queries she had  Disposition Plan:   Physical therapy recommend CIR.  Rehabilitation team has seen the patient and recommend skilled nursing facility placement.  Social worker is on board and awaiting for skilled nursing facility placement.  Consultants:  Neurology  PMR  Procedures:  EEG  Antibiotics: None   Anti-infectives (From admission, onward)   Start     Dose/Rate Route Frequency Ordered Stop   12/19/19 1000  vancomycin (VANCOREADY) IVPB 1250 mg/250 mL  Status:  Discontinued     1,250 mg 166.7 mL/hr over 90 Minutes Intravenous Every 24 hours 12/18/19 0914 12/21/19 1048   12/18/19 1000  piperacillin-tazobactam (ZOSYN) IVPB 3.375 g     3.375 g 12.5 mL/hr over 240 Minutes Intravenous Every 8 hours 12/18/19 0903 12/23/19 2128   12/18/19 0930  vancomycin (VANCOCIN) IVPB 1000 mg/200 mL premix  Status:  Discontinued     1,000 mg 200 mL/hr over 60 Minutes Intravenous  Once 12/18/19 0903 12/18/19 0906   12/18/19 0930  vancomycin (VANCOREADY) IVPB 1250 mg/250 mL     1,250 mg 166.7 mL/hr over 90 Minutes Intravenous  Once 12/18/19  [id number] 12/18/19 1205   12/17/19 1345  cefTRIAXone (ROCEPHIN) 1 g in sodium chloride 0.9 % 100 mL IVPB  Status:  Discontinued     1 g 200 mL/hr over 30 Minutes Intravenous Every 24 hours 12/17/19  1332 12/18/19 0838     Subjective:  Today, patient denies interval complaints but mostly nonverbal.  Objective: Vitals:   12/27/19 2319 12/28/19 0350  BP: 134/68 126/66  Pulse: 85 72  Resp: (!) 29 (!) 24  Temp: 97.8 F (36.6 C) 98.8 F (37.1 C)  SpO2: 100% 100%    Intake/Output Summary (Last 24 hours) at 12/28/2019 0821 Last data filed at 12/28/2019 0451 Gross per 24 hour  Intake 720 ml  Output 1300 ml  Net -580 ml   Filed Weights   12/16/19 1559 12/17/19 1950  Weight: 70.6 kg 62.2 kg   Body mass index is 20.25 kg/m.   Physical Exam: GENERAL: Alert awake not in distress.  Does not appear to be encephalopathic but has expressive aphasia. HENT: No scleral pallor or icterus. Pupils equally reactive to light. Oral mucosa is moist NECK: is supple, no palpable thyroid enlargement. CHEST: Clear to auscultation. No crackles or wheezes. Non tender on palpation. Diminished breath sounds bilaterally. CVS: S1 and S2 heard, no murmur. Regular rate and rhythm. No pericardial rub. ABDOMEN: Soft, non-tender, bowel sounds are present. EXTREMITIES: No edema. CNS: Expressive aphasia.  Moving all extremities.  Baseline dementia. SKIN: warm and dry without rashes.  Data Review: I have personally reviewed the following laboratory data and studies,  CBC: Recent Labs  Lab 12/23/19 0431 12/27/19 0548  WBC 10.2 12.4*  HGB 11.5* 11.3*  HCT 35.0* 35.8*  MCV 105.7* 110.2*  PLT 276 123XX123*   Basic Metabolic Panel: Recent Labs  Lab 12/27/19 0548  NA 142  K 4.3  CL 108  CO2 25  GLUCOSE 98  BUN 22  CREATININE 1.02  CALCIUM 9.0  MG 2.5*   Liver Function Tests: Recent Labs  Lab 12/27/19 0548  AST 44*  ALT 63*  ALKPHOS 92  BILITOT 0.7  PROT 6.7  ALBUMIN 2.8*   No results for input(s): LIPASE, AMYLASE in the last 168 hours. No results for input(s): AMMONIA in the last 168 hours. Cardiac Enzymes: No results for input(s): CKTOTAL, CKMB, CKMBINDEX, TROPONINI in the last 168  hours. BNP (last 3 results) No results for input(s): BNP in the last 8760 hours.  ProBNP (last 3 results) No results for input(s): PROBNP in the last 8760 hours.  CBG: No results for input(s): GLUCAP in the last 168 hours. Recent Results (from the past 240 hour(s))  MRSA PCR Screening     Status: None   Collection Time: 12/18/19  8:38 AM   Specimen: Nasopharyngeal  Result Value Ref Range Status   MRSA by PCR NEGATIVE NEGATIVE Final    Comment:        The GeneXpert MRSA Assay (FDA approved for NASAL specimens only), is one component of a comprehensive MRSA colonization surveillance program. It is not intended to diagnose MRSA infection nor to guide or monitor treatment for MRSA infections. Performed at [facility], [place] [address] [place], [place] [address]   Culture, Urine     Status: None   Collection Time: 12/18/19  4:48 PM   Specimen: Urine, Clean Catch  Result Value Ref Range Status   Specimen Description URINE, CLEAN CATCH  Final   Special Requests NONE  Final   Culture   Final  NO GROWTH Performed at [facility], [place] [address] [place], [place] [address]    Report Status 12/19/2019 FINAL  Final     Studies: No results found.  Scheduled Meds: . amLODipine  10 mg Oral Daily  . aspirin  81 mg Oral Daily  . atorvastatin  80 mg Oral q1800  . carvedilol  3.125 mg Oral BID WC  . clopidogrel  75 mg Oral Daily  . enoxaparin (LOVENOX) injection  40 mg Subcutaneous Q24H  . feeding supplement (ENSURE ENLIVE)  237 mL Oral BID BM  . folic acid  1 mg Oral Daily  . levETIRAcetam  750 mg Oral BID  . multivitamin with minerals  1 tablet Oral Daily  . nicotine  21 mg Transdermal Daily  . thiamine  100 mg Oral Daily   Or  . thiamine  100 mg Intravenous Daily    Continuous Infusions:   Flora Lipps, MD  [facility] 12/28/2019

## 2019-12-28 NOTE — Progress Notes (Signed)
  Speech Language Pathology Treatment: Dysphagia;Cognitive-Linquistic  Patient Details Name: Ronald Wolf MRN: [id number] DOB: 01/30/1946 Today's Date: 12/28/2019 Time: 1007-1040 SLP Time Calculation (min) (ACUTE ONLY): 33 min  Assessment / Plan / Recommendation Clinical Impression  Patient seen at bedside for skilled ST. Patient alert and cooperative. Patient able to name 6/9 items accurately, demonstrating some phonemic/semantic paraphasias. For example, called a cup a "coal." When asked to name the color of this clinicians scrubs, patient stated "uniform." Patient able to answer 5/7 yes/no questions accurately. He benefited from increased time to respond and reduced environmental distractions.  Patient demonstrated the ability to read single words, but showed difficulty with phrases.  Patient able to follow commands related to oral care (such as "stick out your tongue"). Pt using yes/no appropriately when asked about various food items (verbal and visual cues given).  Patient agreeable to straw sips of thin liquid (water), no overt s/s aspiration seen. Patient able to tolerate puree solids well, no s/s aspiration. Patient also seen with trials of upgraded solids (chopped peaches). Patient with good oral acceptance, but demonstrated sluggish mastication and limited bolus manipulation. Pt was able to swallow bites of peaches, but had 1 instance of delayed throat clear. Recommend patient continue dysphagia 1/thin liquids as this deemed safest diet and re-assess for solids upgrade early next week.  RN educated and verbalized understanding.    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  He reportedly had difficulty  talking, worsening right-sided deficits followed by seizures characterized by facial twitching witnessed in the ER.  Acute stroke ruled out by MRI.  Admitted for suspected seizures, confirmed by EEG.  Neurology consulted.  Hospital course complicated by febrile illness, sepsis due to UTI versus other etiology. On 1/5, treated with aggressive IVF per sepsis protocol, developed congested cough and IV changed to NSL. Patient seen briefly by SLP a year ago (Jan 2020) for speech/language related to left MCA.       SLP Plan  Continue with current plan of care       Recommendations  Diet recommendations: Dysphagia 1 (puree);Thin liquid Liquids provided via: Cup;Straw Medication Administration: Crushed with puree Supervision: Staff to assist with self feeding;Full supervision/cueing for compensatory strategies Compensations: Minimize environmental distractions;Slow rate;Small sips/bites;Follow solids with liquid    Follow up: CIR vs SNF                    [place], M.Ed., CCC-SLP Speech Therapy Acute Rehabilitation [id number] 12/28/2019, 10:43 AM

## 2019-12-28 NOTE — Progress Notes (Signed)
Physical Therapy Treatment Patient Details Name: Ronald Wolf MRN: [id number] DOB: [birth year] Today's Date: 12/28/2019    History of Present Illness 74 year old male with PMH of anxiety, depression, hepatitis C, hypertension, hyperlipidemia, trigeminal neuralgia s/p knife treatment, CVA (left MCA), alcohol dependence in remission, vascular dementia, seizure disorder post stroke not on medications PTA, s/p urostomy, found by family unresponsive at 1 PM on 1/3. Acute stroke ruled out by MRI.  Admitted for suspected seizures, confirmed by EEG. On 1/5, treated with aggressive IVF per sepsis protocol, developed congested cough and IV changed to NSL.    PT Comments    Pt performed gt training and functional mobility with min to moderate assistance.  He is progressing well with max encouragement.  Plan remains appropriate for CIR placement.  Will continue to progress patient during hospitalization.    Follow Up Recommendations  CIR;Supervision/Assistance - 24 hour     Equipment Recommendations  (TBA)    Recommendations for Other Services       Precautions / Restrictions Precautions Precautions: Fall Precaution Comments: seizure Restrictions Weight Bearing Restrictions: No    Mobility  Bed Mobility Overal bed mobility: Needs Assistance Bed Mobility: Supine to Sit;Sit to Supine     Supine to sit: Mod assist     General bed mobility comments: Pt able to advance LE to edge of bed without assistance.  He required use of PTA as a railing to pull his trunk into a seated position.  Transfers Overall transfer level: Needs assistance Equipment used: Rolling walker (2 wheeled) Transfers: Sit to/from Stand Sit to Stand: Min assist         General transfer comment: Cues for hand placement.  Pt required assistance to rise to standing as he attempts to pull on RW to achieve standing.  Ambulation/Gait Ambulation/Gait assistance: Min assist Gait Distance (Feet): 45 Feet Assistive  device: Rolling walker (2 wheeled) Gait Pattern/deviations: Step-through pattern;Trunk flexed     General Gait Details: Pt required cues for upper trunk control and RW safety during gt training.  Poor placement with RW when turning and backing.   Stairs             Wheelchair Mobility    Modified Rankin (Stroke Patients Only)       Balance Overall balance assessment: Needs assistance Sitting-balance support: Single extremity supported;Feet supported Sitting balance-Leahy Scale: Fair       Standing balance-Leahy Scale: Poor                              Cognition Arousal/Alertness: Awake/alert Behavior During Therapy: Flat affect Overall Cognitive Status: Difficult to assess                                 General Comments: Pt unable to communicate effectively.  Post session reports he wants his bed up but when bed placed up, he continues to report," I want my back up."      Exercises      General Comments        Pertinent Vitals/Pain Pain Assessment: No/denies pain Faces Pain Scale: No hurt    Home Living                      Prior Function            PT Goals (current goals can now be found in the  care plan section) Acute Rehab PT Goals Patient Stated Goal: pt did not state Potential to Achieve Goals: Fair Progress towards PT goals: Progressing toward goals    Frequency    Min 3X/week      PT Plan Current plan remains appropriate    Co-evaluation              AM-PAC PT "6 Clicks" Mobility   Outcome Measure  Help needed turning from your back to your side while in a flat bed without using bedrails?: A Lot Help needed moving from lying on your back to sitting on the side of a flat bed without using bedrails?: A Lot Help needed moving to and from a bed to a chair (including a wheelchair)?: A Little Help needed standing up from a chair using your arms (e.g., wheelchair or bedside chair)?: A  Little Help needed to walk in hospital room?: A Little Help needed climbing 3-5 steps with a railing? : A Lot 6 Click Score: 15    End of Session Equipment Utilized During Treatment: Gait belt Activity Tolerance: Treatment limited secondary to medical complications (Comment) Patient left: in chair;with call bell/phone within reach;with chair alarm set Nurse Communication: Mobility status PT Visit Diagnosis: Other abnormalities of gait and mobility (R26.89)     Time: [id number] PT Time Calculation (min) (ACUTE ONLY): 21 min  Charges:  $Gait Training: 8-22 mins                     Ronald Wolf , PTA Acute Rehabilitation Services Pager [phone] Office [phone]     Ronald Wolf 12/28/2019, 5:08 PM

## 2019-12-29 DIAGNOSIS — I639 Cerebral infarction, unspecified: Secondary | ICD-10-CM

## 2019-12-29 LAB — GLUCOSE, CAPILLARY: Glucose-Capillary: 108 mg/dL — ABNORMAL HIGH (ref 70–99)

## 2019-12-29 NOTE — Progress Notes (Signed)
PROGRESS NOTE  Ronald Wolf [id number] DOB: 1946/02/24 DOA: 12/16/2019 PCP: Mosie Lukes, MD   LOS: 13 days   Brief narrative: As per HPI,  Ronald Wolf is a 74 y.o. 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 Patient's hospitalization was complicated by sepsis secondary to UTI.  Assessment/Plan:  Principal Problem:   Acute metabolic encephalopathy Active Problems:   HTN (hypertension)   Hyperglycemia   Seizure (HCC)   Polysubstance abuse ([place])   History of stroke  Acute metabolic encephalopathy Metabolic encephalopathy - multifactorial from sepsis, seizures, UTI.  MRI was negative for acute process.  Improved.  Seizures No further seizures reported.  EEG consistent with epileptogenicity and cortical dysfunction in left frontotemporal region likely secondary to underlying encephalomalacia from prior CVA along with diffuse encephalopathic changes with no definite seizures.  Seen by neurology. On Keppra 750 mg twice daily and neuro recommends Ativan 2 mg as needed for seizures. Patient will need outpatient neurology follow-up 6-8 weeks after discharge; recommending vascular surgery follow up. ( carotid vascular  ultrasound from 2020 shows stenosis 1-39% bilaterally).   Aphasia Seen by neurology.  Thought to be secondary to hypoperfusion and seizure.  Patient is much more communicative today.  Eating by himself.  Able to comprehend and answering appropriately.  Oriented to person and place  Sepsis Secondary to UTI.  Resolved. WBC of 12.4 on 1/14 but no fever.  T-max of 98.3.  UTI In setting of urostomy. Completed treatment of Zosyn.  Urine culture with no  growth  dysphagia Seen by speech therapy on 12/27/2018.  Recommend dysphagia 1 diet.  Patient was able to eat by himself today.  History of left MCA stroke MRI was negative for acute stroke this time.  Continue Lipitor, Plavix and aspirin  Hyperlipidemia Continue Lipitor  Essential hypertension Blood pressure is stable.  Continue amlodipine and Coreg.    Hypernatremia Resolved.  Tobacco use Continue nicotine patch  History of alcohol abuse continue multivitamin, thiamine and folate.  Currently stable  S/p urostomy.  VTE Prophylaxis: Lovenox subcu  Code Status: Full code  Family Communication:   None today.   Disposition Plan:   Physical therapy recommend CIR.  Rehabilitation team has seen the patient and recommend skilled nursing facility placement.  Social worker is on board for SNF.  Patient appears to be medically stable for disposition at this time.  Consultants:  Neurology  PMR  Procedures:  EEG  Antibiotics: None   Anti-infectives (From admission, onward)   Start     Dose/Rate Route Frequency Ordered Stop   12/19/19 1000  vancomycin (VANCOREADY) IVPB 1250 mg/250 mL  Status:  Discontinued     1,250 mg 166.7 mL/hr over 90 Minutes Intravenous Every 24 hours 12/18/19 0914 12/21/19 1048   12/18/19 1000  piperacillin-tazobactam (ZOSYN) IVPB 3.375 g     3.375 g 12.5 mL/hr over 240 Minutes Intravenous Every 8 hours 12/18/19 0903 12/23/19 2128   12/18/19 0930  vancomycin (VANCOCIN) IVPB 1000 mg/200 mL premix  Status:  Discontinued     1,000 mg 200 mL/hr over 60 Minutes Intravenous  Once 12/18/19 0903 12/18/19 0906   12/18/19 0930  vancomycin (VANCOREADY) IVPB 1250 mg/250 mL     1,250 mg 166.7 mL/hr over 90 Minutes Intravenous  Once  12/18/19 0906 12/18/19 1205   12/17/19 1345  cefTRIAXone (ROCEPHIN) 1 g in sodium chloride 0.9 % 100 mL IVPB  Status:  Discontinued     1 g 200 mL/hr over 30 Minutes Intravenous Every 24 hours 12/17/19 1332 12/18/19 0838      Subjective:  Today, patient denies interval complaints.  More communicative.  Denies headache, dizziness, lightheadedness.  Able to eat by himself.  Objective: Vitals:   12/29/19 0007 12/29/19 0336  BP: 121/70 122/66  Pulse: 75 70  Resp: 17 19  Temp: 98.3 F (36.8 C) 98.1 F (36.7 C)  SpO2: 100% 100%    Intake/Output Summary (Last 24 hours) at 12/29/2019 0730 Last data filed at 12/29/2019 [id number] Gross per 24 hour  Intake 480 ml  Output 1650 ml  Net -1170 ml   Filed Weights   12/16/19 1559 12/17/19 1950  Weight: 70.6 kg 62.2 kg   Body mass index is 20.25 kg/m.   Physical Exam: GENERAL: Alert awake not in distress.  More communicative today.   HENT: No scleral pallor or icterus. Pupils equally reactive to light. Oral mucosa is moist NECK: is supple, no palpable thyroid enlargement. CHEST: Clear to auscultation. No crackles or wheezes. Non tender on palpation. Diminished breath sounds bilaterally. CVS: S1 and S2 heard, no murmur. Regular rate and rhythm. No pericardial rub. ABDOMEN: Soft, non-tender, bowel sounds are present. EXTREMITIES: No edema. CNS: Cranial nerves intact.  Moving all extremities and power equal in all extremities..  Baseline dementia.  Alert awake and oriented to place and person. SKIN: warm and dry without rashes.  Data Review: I have personally reviewed the following laboratory data and studies,  CBC: Recent Labs  Lab 12/23/19 0431 12/27/19 0548  WBC 10.2 12.4*  HGB 11.5* 11.3*  HCT 35.0* 35.8*  MCV 105.7* 110.2*  PLT 276 123XX123*   Basic Metabolic Panel: Recent Labs  Lab 12/27/19 0548  NA 142  K 4.3  CL 108  CO2 25  GLUCOSE 98  BUN 22  CREATININE 1.02  CALCIUM 9.0  MG 2.5*   Liver Function Tests: Recent Labs  Lab 12/27/19 0548  AST 44*  ALT 63*  ALKPHOS 92  BILITOT 0.7  PROT 6.7  ALBUMIN 2.8*   No results for input(s): LIPASE, AMYLASE in the last 168 hours. No results for input(s): AMMONIA in the last 168  hours. Cardiac Enzymes: No results for input(s): CKTOTAL, CKMB, CKMBINDEX, TROPONINI in the last 168 hours. BNP (last 3 results) No results for input(s): BNP in the last 8760 hours.  ProBNP (last 3 results) No results for input(s): PROBNP in the last 8760 hours.  CBG: No results for input(s): GLUCAP in the last 168 hours. No results found for this or any previous visit (from the past 240 hour(s)).   Studies: No results found.  Scheduled Meds: . amLODipine  10 mg Oral Daily  . aspirin  81 mg Oral Daily  . atorvastatin  80 mg Oral q1800  . carvedilol  3.125 mg Oral BID WC  . clopidogrel  75 mg Oral Daily  . enoxaparin (LOVENOX) injection  40 mg Subcutaneous Q24H  . feeding supplement (ENSURE ENLIVE)  237 mL Oral BID BM  . folic acid  1 mg Oral Daily  . levETIRAcetam  750 mg Oral BID  . multivitamin with minerals  1 tablet Oral Daily  . nicotine  21 mg Transdermal Daily  . thiamine  100 mg Oral Daily   Or  . thiamine  100 mg Intravenous  Daily    Continuous Infusions:   Flora Lipps, MD  [facility] 12/29/2019

## 2019-12-30 NOTE — Progress Notes (Signed)
PROGRESS NOTE  Ronald Wolf [id number] DOB: August 15, 1946 DOA: 12/16/2019 PCP: Mosie Lukes, MD   LOS: 14 days   Brief narrative: As per HPI,  Ronald Wolf is a 74 y.o. 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 Patient's hospitalization was complicated by sepsis secondary to UTI.  Assessment/Plan:  Principal Problem:   Acute metabolic encephalopathy Active Problems:   HTN (hypertension)   Hyperglycemia   Seizure (HCC)   Polysubstance abuse ([place])   History of stroke  Acute metabolic encephalopathy Metabolic encephalopathy - multifactorial from sepsis, seizures, UTI.  MRI was negative for acute process.  Improved.  Seizures No further seizures reported.  EEG consistent with epileptogenicity and cortical dysfunction in left frontotemporal region likely secondary to underlying encephalomalacia from prior CVA along with diffuse encephalopathic changes with no definite seizures.  Seen by neurology. On Keppra 750 mg twice daily . Patient will need outpatient neurology follow-up 6-8 weeks after discharge; recommending vascular surgery follow up. ( carotid vascular  ultrasound from 2020 shows stenosis 1-39% bilaterally).   Aphasia Seen by neurology.  Overall improved. thought to be secondary to hypoperfusion and seizure.   Oriented to person and place.  Eating by himself.  Sepsis Secondary to UTI.  Resolved. WBC of 12.4 on 1/14 but no fever.  T-max of 98.7.  UTI In setting of urostomy. Completed treatment of Zosyn.  Urine culture with no growth  dysphagia Seen by speech therapy on 12/27/2018.  Recommend dysphagia 1 diet.  Patient was able to eat by himself today.  History of left  MCA stroke MRI was negative for acute stroke this time.  Continue Lipitor, Plavix and aspirin  Hyperlipidemia Continue Lipitor  Essential hypertension Blood pressure is stable.  Continue amlodipine and Coreg.    Hypernatremia Resolved.  Tobacco use Continue nicotine patch  History of alcohol abuse continue multivitamin, thiamine and folate.  Currently stable  S/p urostomy. Supportive care  VTE Prophylaxis: Lovenox subcu  Code Status: Full code  Family Communication:   None today.   Disposition Plan:   Physical therapy recommend CIR.  Rehabilitation team has seen the patient and recommend skilled nursing facility placement.  Social worker is on board for SNF.  Patient appears to be medically stable for disposition at this time.  Consultants:  Neurology  PMR  Procedures:  EEG  Antibiotics: None   Anti-infectives (From admission, onward)   Start     Dose/Rate Route Frequency Ordered Stop   12/19/19 1000  vancomycin (VANCOREADY) IVPB 1250 mg/250 mL  Status:  Discontinued     1,250 mg 166.7 mL/hr over 90 Minutes Intravenous Every 24 hours 12/18/19 0914 12/21/19 1048   12/18/19 1000  piperacillin-tazobactam (ZOSYN) IVPB 3.375 g     3.375 g 12.5 mL/hr over 240 Minutes Intravenous Every 8 hours 12/18/19 0903 12/23/19 2128   12/18/19 0930  vancomycin (VANCOCIN) IVPB 1000 mg/200 mL premix  Status:  Discontinued     1,000 mg 200 mL/hr over 60 Minutes Intravenous  Once 12/18/19 0903 12/18/19 0906   12/18/19 0930  vancomycin (VANCOREADY) IVPB 1250 mg/250 mL     1,250 mg 166.7 mL/hr over 90 Minutes Intravenous  Once 12/18/19 0906 12/18/19 1205   12/17/19 1345  cefTRIAXone (ROCEPHIN) 1 g in sodium chloride 0.9 %  100 mL IVPB  Status:  Discontinued     1 g 200 mL/hr over 30 Minutes Intravenous Every 24 hours 12/17/19 1332 12/18/19 0838     Subjective:  Today, patient denies interval complaints.  No fever cough chest pain.  Eating by  himself.   Objective: Vitals:   12/29/19 2331 12/30/19 0322  BP: 128/69 114/70  Pulse: 80 75  Resp: 19 18  Temp: 97.8 F (36.6 C) 98 F (36.7 C)  SpO2: 100% 99%    Intake/Output Summary (Last 24 hours) at 12/30/2019 0802 Last data filed at 12/30/2019 0400 Gross per 24 hour  Intake 360 ml  Output 800 ml  Net -440 ml   Filed Weights   12/16/19 1559 12/17/19 1950  Weight: 70.6 kg 62.2 kg   Body mass index is 20.25 kg/m.   Physical Exam: GENERAL: Alert awake, mildly communicative not in distress.   HENT: No scleral pallor or icterus. Pupils equally reactive to light. Oral mucosa is moist NECK: is supple, no palpable thyroid enlargement. CHEST: Clear to auscultation. No crackles or wheezes. Non tender on palpation. Diminished breath sounds bilaterally. CVS: S1 and S2 heard, no murmur. Regular rate and rhythm. No pericardial rub. ABDOMEN: Soft, non-tender, bowel sounds are present. EXTREMITIES: No edema. CNS: Cranial nerves intact.  Moving all extremities and power equal in all extremities..  Alert awake and mildly communicative.  Oriented to place and person SKIN: warm and dry without rashes.  Data Review: I have personally reviewed the following laboratory data and studies,  CBC: Recent Labs  Lab 12/27/19 0548  WBC 12.4*  HGB 11.3*  HCT 35.8*  MCV 110.2*  PLT 123XX123*   Basic Metabolic Panel: Recent Labs  Lab 12/27/19 0548  NA 142  K 4.3  CL 108  CO2 25  GLUCOSE 98  BUN 22  CREATININE 1.02  CALCIUM 9.0  MG 2.5*   Liver Function Tests: Recent Labs  Lab 12/27/19 0548  AST 44*  ALT 63*  ALKPHOS 92  BILITOT 0.7  PROT 6.7  ALBUMIN 2.8*   No results for input(s): LIPASE, AMYLASE in the last 168 hours. No results for input(s): AMMONIA in the last 168 hours. Cardiac Enzymes: No results for input(s): CKTOTAL, CKMB, CKMBINDEX, TROPONINI in the last 168 hours. BNP (last 3 results) No results for input(s): BNP in the last 8760 hours.  ProBNP (last 3  results) No results for input(s): PROBNP in the last 8760 hours.  CBG: Recent Labs  Lab 12/29/19 1539  GLUCAP 108*   No results found for this or any previous visit (from the past 240 hour(s)).   Studies: No results found.  Scheduled Meds: . amLODipine  10 mg Oral Daily  . aspirin  81 mg Oral Daily  . atorvastatin  80 mg Oral q1800  . carvedilol  3.125 mg Oral BID WC  . clopidogrel  75 mg Oral Daily  . enoxaparin (LOVENOX) injection  40 mg Subcutaneous Q24H  . feeding supplement (ENSURE ENLIVE)  237 mL Oral BID BM  . folic acid  1 mg Oral Daily  . levETIRAcetam  750 mg Oral BID  . multivitamin with minerals  1 tablet Oral Daily  . nicotine  21 mg Transdermal Daily  . thiamine  100 mg Oral Daily   Or  . thiamine  100 mg Intravenous Daily    Continuous Infusions:   Flora Lipps, MD  [facility] 12/30/2019

## 2019-12-31 LAB — BASIC METABOLIC PANEL
Anion gap: 10 (ref 5–15)
BUN: 34 mg/dL — ABNORMAL HIGH (ref 8–23)
CO2: 21 mmol/L — ABNORMAL LOW (ref 22–32)
Calcium: 9 mg/dL (ref 8.9–10.3)
Chloride: 107 mmol/L (ref 98–111)
Creatinine, Ser: 1.21 mg/dL (ref 0.61–1.24)
GFR calc Af Amer: >60 mL/min (ref >60)
GFR calc non Af Amer: 59 mL/min — ABNORMAL LOW (ref >60)
Glucose, Bld: 113 mg/dL — ABNORMAL HIGH (ref 70–99)
Potassium: 4.4 mmol/L (ref 3.5–5.1)
Sodium: 138 mmol/L (ref 135–145)

## 2019-12-31 LAB — CBC
HCT: 30.2 % — ABNORMAL LOW (ref 39.0–52.0)
Hemoglobin: 9.7 g/dL — ABNORMAL LOW (ref 13.0–17.0)
MCH: 34.9 pg — ABNORMAL HIGH (ref 26.0–34.0)
MCHC: 32.1 g/dL (ref 30.0–36.0)
MCV: 108.6 fL — ABNORMAL HIGH (ref 80.0–100.0)
Platelets: 505 10*3/uL — ABNORMAL HIGH (ref 150–400)
RBC: 2.78 MIL/uL — ABNORMAL LOW (ref 4.22–5.81)
RDW: 12.8 % (ref 11.5–15.5)
WBC: 11.4 10*3/uL — ABNORMAL HIGH (ref 4.0–10.5)
nRBC: 0 % (ref 0.0–0.2)

## 2019-12-31 LAB — SARS CORONAVIRUS 2 (TAT 6-24 HRS): SARS Coronavirus 2: NEGATIVE

## 2019-12-31 LAB — MAGNESIUM: Magnesium: 2.3 mg/dL (ref 1.7–2.4)

## 2019-12-31 MED ORDER — LEVETIRACETAM 100 MG/ML PO SOLN
750.0000 mg | Freq: Two times a day (BID) | ORAL | Status: DC
  Administered 2019-12-31 – 2020-01-01 (×3): 750 mg via ORAL
  Filled 2019-12-31 (×4): qty 7.5

## 2019-12-31 MED ORDER — NYSTATIN 100000 UNIT/ML MT SUSP
5.0000 mL | Freq: Four times a day (QID) | OROMUCOSAL | Status: DC
  Administered 2019-12-31 – 2020-01-01 (×6): 500000 [IU] via ORAL
  Filled 2019-12-31 (×6): qty 5

## 2019-12-31 NOTE — Progress Notes (Signed)
  Speech Language Pathology Treatment: Dysphagia;Cognitive-Linquistic  Patient Details Name: Ronald Wolf MRN: [id number] DOB: August 28, 1946 Today's Date: 12/31/2019 Time: [id number] SLP Time Calculation (min) (ACUTE ONLY): 19 min  Assessment / Plan / Recommendation Clinical Impression  Pt seen at bedside for skilled ST. Pt able to engage in simple conversation with appropriate greeting and response to simple questions. Pt able to name items in confrontation naming task with 80% accuracy. Pt demonstrating verbal perseveration in both confrontation naming task and in conversation. Pt benefits from reduced environmental stimuli, increased time to respond to verbal questions and reduced verbal stimulation. Pt noted to have occasional cough t/o session.  Oral care completed. Patient with ?oral thrush on tongue. Pt stated his tongue is painful. Pt seen with thin liquids via cup and straw, no s/sx aspiration. Pt seen with trials of chopped mixed consistency solids (peaches in sauce). Pt with increased AP transfer and somewhat sluggish mastication, but able to swallow peaches without s/sx aspiration. Recommend upgrade solids to dysphagia 2, continue thin liquids. Recommend patient take small bites and sips, slow rate of intake, and have supervision with meals.   ST to follow as per POC. RN edu re: diet upgrade.    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  He reportedly had difficulty talking, worsening right-sided deficits followed by seizures characterized by facial twitching witnessed in the ER.  Acute stroke ruled out by MRI.  Admitted for suspected seizures, confirmed by EEG.  Neurology consulted.  Hospital course  complicated by febrile illness, sepsis due to UTI versus other etiology. On 1/5, treated with aggressive IVF per sepsis protocol, developed congested cough and IV changed to NSL. Patient seen briefly by SLP a year ago (Jan 2020) for speech/language related to left MCA.       SLP Plan  Continue with current plan of care       Recommendations  Diet recommendations: Dysphagia 2 (fine chop);Thin liquid Liquids provided via: Cup;Straw Medication Administration: Crushed with puree Supervision: Staff to assist with self feeding;Full supervision/cueing for compensatory strategies Compensations: Minimize environmental distractions;Slow rate;Small sips/bites;Lingual sweep for clearance of pocketing Postural Changes and/or Swallow Maneuvers: Seated upright 90 degrees;Upright 30-60 min after meal                Oral Care Recommendations: Oral care QID Follow up Recommendations: Skilled Nursing facility;Inpatient Rehab SLP Visit Diagnosis: Dysphagia, oral phase (R13.11);Aphasia (R47.01) Plan: Continue with current plan of care       Garrison, M.Ed., CCC-SLP Speech Therapy Acute Rehabilitation [place] 12/31/2019, 9:57 AM

## 2019-12-31 NOTE — Progress Notes (Signed)
PROGRESS NOTE  Ronald Wolf [id number] DOB: 1946/01/11 DOA: 12/16/2019 PCP: Mosie Lukes, MD   LOS: 15 days   Brief narrative: As per HPI,  Ronald Wolf is a 74 y.o. 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 Patient's hospitalization was complicated by sepsis secondary to UTI.  Assessment/Plan:  Principal Problem:   Acute metabolic encephalopathy Active Problems:   HTN (hypertension)   Hyperglycemia   Seizure (HCC)   Polysubstance abuse ([place])   History of stroke  Acute metabolic encephalopathy Metabolic encephalopathy - multifactorial from sepsis, seizures, UTI.  MRI was negative for acute process.  Improved but has expressive aphasia.  Seizures No further seizures reported.  EEG consistent with epileptogenicity and cortical dysfunction in left frontotemporal region likely secondary to underlying encephalomalacia from prior CVA along with diffuse encephalopathic changes with no definite seizures.  Seen by neurology. On Keppra 750 mg twice daily . Patient will need outpatient neurology follow-up 6-8 weeks after discharge; recommending vascular surgery follow up. ( carotid vascular  ultrasound from 2020 shows stenosis 1-39% bilaterally).   Aphasia Seen by neurology.  Thought to be secondary to hypoperfusion and seizure.   Intermittent aphasia.  Patient was oriented to place and person yesterday.  The patient did have multiple  Sepsis Secondary to UTI.  Resolved. WBC of 12.4 on 1/14 but no fever.  T-max of 98.7.  UTI In setting of urostomy. Completed treatment of Zosyn.  Urine culture with no growth  dysphagia Seen by speech therapy on 12/27/2018.  Recommend dysphagia 1  diet.  Patient able to eat by himself.  History of left MCA stroke MRI was negative for acute stroke this time.  Continue Lipitor, Plavix and aspirin  Hyperlipidemia Continue Lipitor  Essential hypertension Blood pressure is stable.  Continue amlodipine and Coreg.    Hypernatremia Resolved.  Tobacco use Continue nicotine patch  History of alcohol abuse continue multivitamin, thiamine and folate.  Currently stable  S/p urostomy. Supportive care  VTE Prophylaxis: Lovenox subcu  Code Status: Full code  Family Communication:   None today.   Disposition Plan:   Pending skilled nursing facility placement, likely 01/01/2020 as per Education officer, museum .   Consultants:  Neurology  PMR  Procedures:  EEG  Antibiotics: None   Anti-infectives (From admission, onward)   Start     Dose/Rate Route Frequency Ordered Stop   12/19/19 1000  vancomycin (VANCOREADY) IVPB 1250 mg/250 mL  Status:  Discontinued     1,250 mg 166.7 mL/hr over 90 Minutes Intravenous Every 24 hours 12/18/19 0914 12/21/19 1048   12/18/19 1000  piperacillin-tazobactam (ZOSYN) IVPB 3.375 g     3.375 g 12.5 mL/hr over 240 Minutes Intravenous Every 8 hours 12/18/19 0903 12/23/19 2128   12/18/19 0930  vancomycin (VANCOCIN) IVPB 1000 mg/200 mL premix  Status:  Discontinued     1,000 mg 200 mL/hr over 60 Minutes Intravenous  Once 12/18/19 0903 12/18/19 0906   12/18/19 0930  vancomycin (VANCOREADY) IVPB 1250 mg/250 mL     1,250 mg 166.7 mL/hr over 90 Minutes Intravenous  Once 12/18/19 0906 12/18/19 1205   12/17/19 1345  cefTRIAXone (ROCEPHIN) 1 g in sodium chloride 0.9 % 100 mL IVPB  Status:  Discontinued     1 g 200 mL/hr over  30 Minutes Intravenous Every 24 hours 12/17/19 1332 12/18/19 0838     Subjective:  Today, not much verbal today.  Denies fever pain.  No nausea vomiting reported.  Objective: Vitals:   12/31/19 0810 12/31/19 1213  BP: 111/71 117/71  Pulse: 82 98  Resp: 18 14  Temp: 97.8 F  (36.6 C) 98.3 F (36.8 C)  SpO2: 97%     Intake/Output Summary (Last 24 hours) at 12/31/2019 1514 Last data filed at 12/31/2019 1400 Gross per 24 hour  Intake 800 ml  Output 1625 ml  Net -825 ml   Filed Weights   12/16/19 1559 12/17/19 1950  Weight: 70.6 kg 62.2 kg   Body mass index is 20.25 kg/m.   Physical Exam: GENERAL: Alert awake, not in distress.  Afebrile.   HENT: No scleral pallor or icterus. Pupils equally reactive to light. Oral mucosa is moist NECK: is supple, no palpable thyroid enlargement. CHEST: Clear to auscultation. No crackles or wheezes. Non tender on palpation. Diminished breath sounds bilaterally. CVS: S1 and S2 heard, no murmur. Regular rate and rhythm. No pericardial rub. ABDOMEN: Soft, non-tender, bowel sounds are present. EXTREMITIES: No edema. CNS: Cranial nerves intact.  Moving all extremities and power equal in all extremities..  Alert awake not much verbal today.   SKIN: warm and dry without rashes.  Data Review: I have personally reviewed the following laboratory data and studies,  CBC: Recent Labs  Lab 12/27/19 0548 12/31/19 0221  WBC 12.4* 11.4*  HGB 11.3* 9.7*  HCT 35.8* 30.2*  MCV 110.2* 108.6*  PLT 490* [id number]*   Basic Metabolic Panel: Recent Labs  Lab 12/27/19 0548 12/31/19 0221  NA 142 138  K 4.3 4.4  CL 108 107  CO2 25 21*  GLUCOSE 98 113*  BUN 22 34*  CREATININE 1.02 1.21  CALCIUM 9.0 9.0  MG 2.5* 2.3   Liver Function Tests: Recent Labs  Lab 12/27/19 0548  AST 44*  ALT 63*  ALKPHOS 92  BILITOT 0.7  PROT 6.7  ALBUMIN 2.8*   No results for input(s): LIPASE, AMYLASE in the last 168 hours. No results for input(s): AMMONIA in the last 168 hours. Cardiac Enzymes: No results for input(s): CKTOTAL, CKMB, CKMBINDEX, TROPONINI in the last 168 hours. BNP (last 3 results) No results for input(s): BNP in the last 8760 hours.  ProBNP (last 3 results) No results for input(s): PROBNP in the last 8760  hours.  CBG: Recent Labs  Lab 12/29/19 1539  GLUCAP 108*   Recent Results (from the past 240 hour(s))  SARS CORONAVIRUS 2 (TAT 6-24 HRS) Nasopharyngeal Nasopharyngeal Swab     Status: None   Collection Time: 12/31/19  9:40 AM   Specimen: Nasopharyngeal Swab  Result Value Ref Range Status   SARS Coronavirus 2 NEGATIVE NEGATIVE Final    Comment: (NOTE) SARS-CoV-2 target nucleic acids are NOT DETECTED. The SARS-CoV-2 RNA is generally detectable in upper and lower respiratory specimens during the acute phase of infection. Negative results do not preclude SARS-CoV-2 infection, do not rule out co-infections with other pathogens, and should not be used as the sole basis for treatment or other patient management decisions. Negative results must be combined with clinical observations, patient history, and epidemiological information. The expected result is Negative. Fact Sheet for Patients: SugarRoll.be Fact Sheet for Healthcare Providers: https://www.woods-mathews.com/ This test is not yet approved or cleared by the [place] FDA and  has been authorized for detection and/or diagnosis of SARS-CoV-2 by FDA under an Emergency Use  Authorization (EUA). This EUA will remain  in effect (meaning this test can be used) for the duration of the COVID-19 declaration under Section 56 4(b)(1) of the Act, 21 U.S.C. section 360bbb-3(b)(1), unless the authorization is terminated or revoked sooner. Performed at [facility], [place] [address], [place], [place] [address]      Studies: No results found.  Scheduled Meds: . amLODipine  10 mg Oral Daily  . aspirin  81 mg Oral Daily  . atorvastatin  80 mg Oral q1800  . carvedilol  3.125 mg Oral BID WC  . clopidogrel  75 mg Oral Daily  . enoxaparin (LOVENOX) injection  40 mg Subcutaneous Q24H  . feeding supplement (ENSURE ENLIVE)  237 mL Oral BID BM  . folic acid  1 mg Oral Daily  . levETIRAcetam   750 mg Oral BID  . multivitamin with minerals  1 tablet Oral Daily  . nicotine  21 mg Transdermal Daily  . nystatin  5 mL Oral QID  . thiamine  100 mg Oral Daily   Or  . thiamine  100 mg Intravenous Daily    Continuous Infusions:   Flora Lipps, MD  [facility] 12/31/2019

## 2019-12-31 NOTE — Progress Notes (Addendum)
Physical Therapy Treatment Patient Details Name: Ronald Wolf MRN: [id number] DOB: November 14, 1946 Today's Date: 12/31/2019    History of Present Illness 74 year old male with PMH of anxiety, depression, hepatitis C, hypertension, hyperlipidemia, trigeminal neuralgia s/p knife treatment, CVA (left MCA), alcohol dependence in remission, vascular dementia, seizure disorder post stroke not on medications PTA, s/p urostomy, found by family unresponsive at 1 PM on 1/3. Acute stroke ruled out by MRI.  Admitted for suspected seizures, confirmed by EEG. On 1/5, treated with aggressive IVF per sepsis protocol, developed congested cough and IV changed to NSL.    PT Comments    Pt performed gt training and functional mobility during session, he required moderate assistance to transfer from bed to sitting and sitting to standing he required min.  Pt remains to present with poor safety during ambulation and difficulty following commands to correct.  He continues to benefit from post acute rehab before returning home.     Follow Up Recommendations  CIR;Supervision/Assistance - 24 hour     Equipment Recommendations       Recommendations for Other Services       Precautions / Restrictions Precautions Precautions: Fall Precaution Comments: seizure Restrictions Weight Bearing Restrictions: No    Mobility  Bed Mobility Overal bed mobility: Needs Assistance Bed Mobility: Supine to Sit;Sit to Supine     Supine to sit: Mod assist Sit to supine: Min assist   General bed mobility comments: Pt able to advance LE to edge of bed without assistance.  He required use of PTA as a railing to pull his trunk into a seated position.  Required assistance to reposition legs and trunk when in supine.  He was able to lift LEs against gravity.  Transfers Overall transfer level: Needs assistance Equipment used: Rolling walker (2 wheeled) Transfers: Sit to/from Stand Sit to Stand: Min assist        Lateral/Scoot  Transfers: Min guard General transfer comment: Cues for hand placement.  PT continues to pull on RW toi achieve standing.  Bed height elevated to improve ease.  Ambulation/Gait Ambulation/Gait assistance: Min guard/min assist Gait Distance (Feet): 60 Feet Assistive device: Rolling walker (2 wheeled) Gait Pattern/deviations: Step-through pattern;Trunk flexed     General Gait Details: Cues for upper trunk control and to step closer to device.  Pt required assistance to keep RW close and assistance when turning to maintain safe position,.   Stairs             Wheelchair Mobility    Modified Rankin (Stroke Patients Only)       Balance Overall balance assessment: Needs assistance Sitting-balance support: Single extremity supported;Feet supported Sitting balance-Leahy Scale: Fair       Standing balance-Leahy Scale: Poor Standing balance comment: unable to progress to full upright posture                            Cognition Arousal/Alertness: Awake/alert Behavior During Therapy: Flat affect Overall Cognitive Status: Difficult to assess                                 General Comments: Pt remains with communication deficits and slow processing.  He has difficulty following multistep commands.      Exercises      General Comments        Pertinent Vitals/Pain Pain Assessment: Faces Faces Pain Scale: No hurt Pain Intervention(s): Monitored  during session;Repositioned    Home Living                      Prior Function            PT Goals (current goals can now be found in the care plan section) Acute Rehab PT Goals Patient Stated Goal: pt did not state Potential to Achieve Goals: Fair Progress towards PT goals: Progressing toward goals    Frequency    Min 3X/week      PT Plan Current plan remains appropriate    Co-evaluation              AM-PAC PT "6 Clicks" Mobility   Outcome Measure  Help needed  turning from your back to your side while in a flat bed without using bedrails?: A Lot Help needed moving from lying on your back to sitting on the side of a flat bed without using bedrails?: A Lot Help needed moving to and from a bed to a chair (including a wheelchair)?: A Little Help needed standing up from a chair using your arms (e.g., wheelchair or bedside chair)?: A Little Help needed to walk in hospital room?: A Little Help needed climbing 3-5 steps with a railing? : A Lot 6 Click Score: 15    End of Session Equipment Utilized During Treatment: Gait belt Activity Tolerance: Treatment limited secondary to medical complications (Comment) Patient left: in chair;with call bell/phone within reach;with chair alarm set Nurse Communication: Mobility status PT Visit Diagnosis: Other abnormalities of gait and mobility (R26.89)     Time: [id number] PT Time Calculation (min) (ACUTE ONLY): 16 min  Charges:  $Gait Training: 8-22 mins                     Erasmo Leventhal , PTA Acute Rehabilitation Services Pager [phone] Office [phone]     Mathan Darroch Eli Hose 12/31/2019, 3:27 PM

## 2020-01-01 DIAGNOSIS — G40001 Localization-related (focal) (partial) idiopathic epilepsy and epileptic syndromes with seizures of localized onset, not intractable, with status epilepticus: Secondary | ICD-10-CM | POA: Diagnosis not present

## 2020-01-01 DIAGNOSIS — D649 Anemia, unspecified: Secondary | ICD-10-CM | POA: Diagnosis not present

## 2020-01-01 DIAGNOSIS — R4701 Aphasia: Secondary | ICD-10-CM | POA: Diagnosis not present

## 2020-01-01 DIAGNOSIS — M2550 Pain in unspecified joint: Secondary | ICD-10-CM | POA: Diagnosis not present

## 2020-01-01 DIAGNOSIS — F1910 Other psychoactive substance abuse, uncomplicated: Secondary | ICD-10-CM | POA: Diagnosis not present

## 2020-01-01 DIAGNOSIS — R6889 Other general symptoms and signs: Secondary | ICD-10-CM | POA: Diagnosis not present

## 2020-01-01 DIAGNOSIS — I10 Essential (primary) hypertension: Secondary | ICD-10-CM | POA: Diagnosis not present

## 2020-01-01 DIAGNOSIS — I639 Cerebral infarction, unspecified: Secondary | ICD-10-CM | POA: Diagnosis not present

## 2020-01-01 DIAGNOSIS — Z8673 Personal history of transient ischemic attack (TIA), and cerebral infarction without residual deficits: Secondary | ICD-10-CM | POA: Diagnosis not present

## 2020-01-01 DIAGNOSIS — R262 Difficulty in walking, not elsewhere classified: Secondary | ICD-10-CM | POA: Diagnosis not present

## 2020-01-01 DIAGNOSIS — R5381 Other malaise: Secondary | ICD-10-CM | POA: Diagnosis not present

## 2020-01-01 DIAGNOSIS — R1312 Dysphagia, oropharyngeal phase: Secondary | ICD-10-CM | POA: Diagnosis not present

## 2020-01-01 DIAGNOSIS — A419 Sepsis, unspecified organism: Secondary | ICD-10-CM | POA: Diagnosis not present

## 2020-01-01 DIAGNOSIS — Z7401 Bed confinement status: Secondary | ICD-10-CM | POA: Diagnosis not present

## 2020-01-01 DIAGNOSIS — R569 Unspecified convulsions: Secondary | ICD-10-CM | POA: Diagnosis not present

## 2020-01-01 DIAGNOSIS — Z1159 Encounter for screening for other viral diseases: Secondary | ICD-10-CM | POA: Diagnosis not present

## 2020-01-01 DIAGNOSIS — G9341 Metabolic encephalopathy: Secondary | ICD-10-CM | POA: Diagnosis not present

## 2020-01-01 DIAGNOSIS — R488 Other symbolic dysfunctions: Secondary | ICD-10-CM | POA: Diagnosis not present

## 2020-01-01 DIAGNOSIS — R739 Hyperglycemia, unspecified: Secondary | ICD-10-CM | POA: Diagnosis not present

## 2020-01-01 DIAGNOSIS — F445 Conversion disorder with seizures or convulsions: Secondary | ICD-10-CM | POA: Diagnosis not present

## 2020-01-01 DIAGNOSIS — N390 Urinary tract infection, site not specified: Secondary | ICD-10-CM | POA: Diagnosis not present

## 2020-01-01 DIAGNOSIS — E785 Hyperlipidemia, unspecified: Secondary | ICD-10-CM | POA: Diagnosis not present

## 2020-01-01 DIAGNOSIS — M6281 Muscle weakness (generalized): Secondary | ICD-10-CM | POA: Diagnosis not present

## 2020-01-01 DIAGNOSIS — Z720 Tobacco use: Secondary | ICD-10-CM | POA: Diagnosis not present

## 2020-01-01 MED ORDER — AMLODIPINE BESYLATE 10 MG PO TABS: 10.0000 mg | ORAL_TABLET | Freq: Every day | ORAL | Status: DC

## 2020-01-01 MED ORDER — CARVEDILOL 3.125 MG PO TABS: 3.1250 mg | ORAL_TABLET | Freq: Two times a day (BID) | ORAL | Status: DC

## 2020-01-01 MED ORDER — THIAMINE HCL 100 MG PO TABS: 100.0000 mg | ORAL_TABLET | Freq: Every day | ORAL | Status: DC

## 2020-01-01 MED ORDER — NYSTATIN 100000 UNIT/ML MT SUSP: 5.0000 mL | Freq: Four times a day (QID) | OROMUCOSAL | 0 refills | Status: DC

## 2020-01-01 MED ORDER — FOLIC ACID 1 MG PO TABS: 1.0000 mg | ORAL_TABLET | Freq: Every day | ORAL | Status: DC

## 2020-01-01 MED ORDER — LEVETIRACETAM 750 MG PO TABS: 750.0000 mg | ORAL_TABLET | Freq: Two times a day (BID) | ORAL | Status: DC

## 2020-01-01 MED ORDER — ADULT MULTIVITAMIN W/MINERALS CH: 1.0000 | ORAL_TABLET | Freq: Every day | ORAL | Status: DC

## 2020-01-01 MED ORDER — POLYETHYLENE GLYCOL 3350 17 G PO PACK: 17.0000 g | PACK | Freq: Every day | ORAL | 0 refills | Status: DC | PRN

## 2020-01-01 NOTE — Discharge Summary (Signed)
Physician Discharge Summary  Ronald Wolf [id number] DOB: Sep 28, 1946 DOA: 12/16/2019  PCP: Mosie Lukes, MD  Admit date: 12/16/2019 Discharge date: 01/01/2020  Admitted From: Home  Discharge disposition: Skilled nursing facility   Recommendations for Outpatient Follow-Up:     Follow up with your primary care provider at the skilled nursing facility in 3 to 5 days.     Follow-up with [facility] in 3 to 4 weeks for stroke and seizure follow-up.   Please wean nicotine patch after discharge.   Please assess with speech therapy to see if diet can be advanced.  Currently on dysphagia 1 diet.   Patient has bilateral carotid artery stenosis on Left > 80%.  This will need to be followed up by vascular surgery as outpatient.  Ambulatory referral to vascular surgery has been placed. (CTA showed severe atherosclerotic disease in both carotid siphon regions, worse on the left than the right. On the left, stenosis appears to be greater than 80%. On the right, stenosis is estimated at 50-70%)   Discharge Diagnosis:   Principal Problem:   Acute metabolic encephalopathy Active Problems:   HTN (hypertension)   Hyperglycemia   Seizure (HCC)   Polysubstance abuse ([place])   History of stroke   Discharge Condition: Improved.  Diet recommendation: Low sodium, heart healthy.  Dysphagia 1 diet.  Wound care: None.  Code status: Full.   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 Patient's hospitalization was complicated by sepsis secondary to UTI.  Hospital Course:  Following conditions were addressed during  hospitalization as listed below,  Acute metabolic encephalopathy Metabolic encephalopathy - multifactorial from sepsis, seizures, UTI.  MRI was negative for acute process.  Improved but has expressive aphasia.  Seizures No further seizures reported.  Patient had a CT angiogram of the brain/neck and MRI of the brain.  There was no evidence of new stroke but old strokes were noted. EEG consistent with epileptogenicity and cortical dysfunction in left frontotemporal region likely secondary to underlying encephalomalacia from prior CVA along with diffuse encephalopathic changes with no definite seizures.  Seen by neurology. On Keppra 750 mg twice daily . Patient will need outpatient neurology follow-up 6-8 weeks after discharge.  Will need monitoring of his carotid stenosis as outpatient.  Aphasia Seen by neurology.  Thought to be secondary to hypoperfusion and seizure.    History of vascular dementia. Intermittent aphasia.  Will need to follow-up with neurology as outpatient.  Sepsis Secondary to UTI.  Resolved. WBC of 12.4 on 1/14 but no fever.  T-max of 98.7.  UTI In setting of urostomy. Completed treatment of Zosyn.  Urine culture with no growth  dysphagia Seen by speech therapy on 12/30/2018.  Recommend dysphagia 1 diet.  Patient able to eat by himself.  He is follow-up with his speech therapy after discharge  History of left MCA stroke MRI was negative for acute stroke this time.  Continue Lipitor, Plavix and aspirin.  Carotid siphon stenosis-  Will need outpatient follow-up with vascular surgery.  Hyperlipidemia Continue Lipitor  Essential hypertension Blood pressure is stable.  Continue amlodipine and Coreg.    Hypernatremia Resolved.  Tobacco use Continue nicotine patch.  Please wean nicotine patch  over time.  History of alcohol abuse continue multivitamin, thiamine and folate on discharge. Currently stable.  No withdrawal symptoms reported to the hospital.  S/p  urostomy. Supportive care  Disposition.  At this time, patient is stable for disposition to skilled nursing facility.  Spoke with the patient's sister and updated her about the clinical condition of the patient and the plan for disposition.  Medical Consultants:    Neurology  PMR  Procedures:    EEG Subjective:   Today, patient denies interval complaints.  Discharge Exam:   Vitals:   01/01/20 0820 01/01/20 0951  BP: 115/73 121/68  Pulse: 81 89  Resp: 20   Temp: 97.6 F (36.4 C)   SpO2:     Vitals:   12/31/19 2332 01/01/20 0400 01/01/20 0820 01/01/20 0951  BP: 107/62 124/72 115/73 121/68  Pulse: 90 78 81 89  Resp: 17 19 20    Temp: 98.5 F (36.9 C) 97.6 F (36.4 C) 97.6 F (36.4 C)   TempSrc: Oral Oral Oral   SpO2: 100% 99%    Weight:      Height:       General: Alert awake, not in obvious distress, anxiety HENT: pupils equally reacting to light and accommodation.  No scleral pallor or icterus noted. Oral mucosa is moist.  Chest:  Clear breath sounds.  Diminished breath sounds bilaterally. No crackles or wheezes.  CVS: S1 &S2 heard. No murmur.  Regular rate and rhythm. Abdomen: Soft, nontender, nondistended.  Bowel sounds are heard.   Extremities: No cyanosis, clubbing or edema.  Peripheral pulses are palpable. Psych: Alert, awake and aphasic, normal mood CNS:   Power equal in all extremities.   Skin: Warm and dry.  No rashes noted.  The results of significant diagnostics from this hospitalization (including imaging, microbiology, ancillary and laboratory) are listed below for reference.     Diagnostic Studies:   CT Code Stroke CTA Head W/WO contrast  Result Date: 12/16/2019 EXAM: CT ANGIOGRAPHY HEAD AND NECK CT PERFUSION BRAIN TECHNIQUE: Multidetector CT imaging of the head and neck was performed using the standard protocol during bolus administration of intravenous contrast. Multiplanar CT image reconstructions and MIPs were obtained to evaluate the  vascular anatomy. Carotid stenosis measurements (when applicable) are obtained utilizing NASCET criteria, using the distal internal carotid diameter as the denominator. Multiphase CT imaging of the brain was performed following IV bolus contrast injection. Subsequent parametric perfusion maps were calculated using RAPID software. CONTRAST:  165mL OMNIPAQUE IOHEXOL 350 MG/ML SOLN COMPARISON:  Head CT same day.  CT angiography 08/24/2017. FINDINGS: CTA NECK FINDINGS Aortic arch: There is aortic atherosclerosis. The scan does not include the brachiocephalic vessel origins other than the left subclavian origin which is widely patent. Right carotid system: Common carotid artery widely patent to the bifurcation. Atherosclerotic disease at the carotid bifurcation and ICA bulb but no stenosis of the bulb beyond that of more distal cervical ICA. Left carotid system: Common carotid artery widely patent to the bifurcation. Atherosclerotic plaque at the carotid bifurcation and ICA bulb. Minimal diameter of the ICA bulb is 4 mm. Compared to a more distal cervical ICA diameter of 4.2 mm, this does not represent any significant stenosis. Vertebral arteries: 30% stenosis at both vertebral artery origins. Beyond the origins, both vessels are widely patent through the cervical region to the foramen magnum. Skeleton: Ordinary cervical spondylosis and facet osteoarthritis. Other neck: No mass or lymphadenopathy. Upper chest: Scarring and emphysematous changes at the apices. No acute process. Review of  the MIP images confirms the above findings CTA HEAD FINDINGS Anterior circulation: Internal carotid arteries are patent through the carotid siphon regions, but there is severe atherosclerotic narrowing and irregularity, particularly on the left where there appears to be greater than 80% stenosis. Stenosis on the right estimated at 50-70%. Supraclinoid internal carotid arteries are widely patent. Both anterior cerebral arteries are widely  patent. There is atherosclerotic disease of both M1 segments, 50% stenosis on the right and serial irregular 50-70% stenoses on the left. I do not see any acute distal large or medium vessel occlusion however. Posterior circulation: Dominant right vertebral artery widely patent to the basilar. Small left vertebral artery largely terminates in PICA. Mild narrowing and irregularity of the basilar without flow limiting stenosis. Superior cerebellar and posterior cerebral arteries are patent. 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 Severe stenosis of the left P1 segment. Venous sinuses: Patent and normal. Anatomic variants: None significant. 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 No sign of acute infarction by head CT. CBF (<30%) Volume: 69mL Perfusion (Tmax>6.0s) volume: 32mL Mismatch Volume: 15mL Infarction Location:None IMPRESSION: No acute large vessel occlusion. Atherosclerotic disease at both carotid bifurcations. No significant stenosis. 30% stenosis at both vertebral artery origins. Severe atherosclerotic disease in both carotid siphon regions, worse on the left than the right. On the left, stenosis appears to be greater than 80%. On the right, stenosis is estimated at 50-70%. Atherosclerotic disease of both M1 segments. 50% stenosis on the right. Serial irregular 50-70% stenoses on the left. Atherosclerotic narrowing of both PCA branches as seen previously, with a severe stenosis of the P1 segment on the left. Electronically Signed   By: Nelson Chimes M.D.   On: 12/16/2019 15:57   CT Code Stroke CTA Neck W/WO contrast  Result Date: 12/16/2019 EXAM: CT ANGIOGRAPHY HEAD AND NECK CT PERFUSION BRAIN TECHNIQUE: Multidetector CT imaging of the head and neck was performed using the standard protocol during bolus administration of intravenous contrast. Multiplanar CT  image reconstructions and MIPs were obtained to evaluate the vascular anatomy. Carotid stenosis measurements (when applicable) are obtained utilizing NASCET criteria, using the distal internal carotid diameter as the denominator. Multiphase CT imaging of the brain was performed following IV bolus contrast injection. Subsequent parametric perfusion maps were calculated using RAPID software. CONTRAST:  14mL OMNIPAQUE IOHEXOL 350 MG/ML SOLN COMPARISON:  Head CT same day.  CT angiography 08/24/2017. FINDINGS: CTA NECK FINDINGS Aortic arch: There is aortic atherosclerosis. The scan does not include the brachiocephalic vessel origins other than the left subclavian origin which is widely patent. Right carotid system: Common carotid artery widely patent to the bifurcation. Atherosclerotic disease at the carotid bifurcation and ICA bulb but no stenosis of the bulb beyond that of more distal cervical ICA. Left carotid system: Common carotid artery widely patent to the bifurcation. Atherosclerotic plaque at the carotid bifurcation and ICA bulb. Minimal diameter of the ICA bulb is 4 mm. Compared to a more distal cervical ICA diameter of 4.2 mm, this does not represent any significant stenosis. Vertebral arteries: 30% stenosis at both vertebral artery origins. Beyond the origins, both vessels are widely patent through the cervical region to the foramen magnum. Skeleton: Ordinary cervical spondylosis and facet osteoarthritis. Other neck: No mass or lymphadenopathy. Upper chest: Scarring and emphysematous changes at the apices. No acute process. Review of the MIP images confirms the  above findings CTA HEAD FINDINGS Anterior circulation: Internal carotid arteries are patent through the carotid siphon regions, but there is severe atherosclerotic narrowing and irregularity, particularly on the left where there appears to be greater than 80% stenosis. Stenosis on the right estimated at 50-70%. Supraclinoid internal carotid arteries  are widely patent. Both anterior cerebral arteries are widely patent. There is atherosclerotic disease of both M1 segments, 50% stenosis on the right and serial irregular 50-70% stenoses on the left. I do not see any acute distal large or medium vessel occlusion however. Posterior circulation: Dominant right vertebral artery widely patent to the basilar. Small left vertebral artery largely terminates in PICA. Mild narrowing and irregularity of the basilar without flow limiting stenosis. Superior cerebellar and posterior cerebral arteries are patent. 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 Severe stenosis of the left P1 segment. Venous sinuses: Patent and normal. Anatomic variants: None significant. 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 No sign of acute infarction by head CT. CBF (<30%) Volume: 42mL Perfusion (Tmax>6.0s) volume: 67mL Mismatch Volume: 79mL Infarction Location:None IMPRESSION: No acute large vessel occlusion. Atherosclerotic disease at both carotid bifurcations. No significant stenosis. 30% stenosis at both vertebral artery origins. Severe atherosclerotic disease in both carotid siphon regions, worse on the left than the right. On the left, stenosis appears to be greater than 80%. On the right, stenosis is estimated at 50-70%. Atherosclerotic disease of both M1 segments. 50% stenosis on the right. Serial irregular 50-70% stenoses on the left. Atherosclerotic narrowing of both PCA branches as seen previously, with a severe stenosis of the P1 segment on the left. Electronically Signed   By: Nelson Chimes M.D.   On: 12/16/2019 15:57   MR Brain Wo Contrast (neuro protocol)  Result Date: 12/16/2019 CLINICAL DATA:  Right arm weakness and gait disturbance. Abnormal gaze. Old infarctions left MCA territory. EXAM: MRI HEAD WITHOUT CONTRAST TECHNIQUE:  Multiplanar, multiecho pulse sequences of the brain and surrounding structures were obtained without intravenous contrast. COMPARISON:  CT studies earlier same day.  MRI 04/28/2019 FINDINGS: Brain: The study suffers from some motion degradation. No sign of acute infarction based on diffusion imaging. Punctate focus of restricted diffusion in the left posterior frontal region and left parietal region in the areas of old infarction not likely related to acute insult. No brainstem or cerebellar infarction. Right cerebral hemisphere shows mild chronic small-vessel change of the deep white matter. On the left, there is an extensive old left MCA territory infarction affecting the left basal ganglia, external capsule and frontoparietal brain. Hemosiderin deposition in the regions of old left MCA infarction. Volume loss with ex vacuo enlargement of the left lateral ventricle. Mass lesion or extra-axial collection Vascular: Major vessels at the base of the brain show flow. 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 No sign of acute or subacute infarction. 2 small foci of restricted diffusion within the regions of old infarction in the left posterior frontal and parietal region not felt to relate to acute insult. Hemosiderin deposition in the regions of the old left MCA infarction. Electronically Signed   By: Nelson Chimes M.D.   On: 12/16/2019 17:53   CT Code Stroke Cerebral Perfusion with contrast  Result Date: 12/16/2019 EXAM: CT ANGIOGRAPHY HEAD AND NECK CT PERFUSION BRAIN TECHNIQUE: Multidetector CT imaging of  the head and neck was performed using the standard protocol during bolus administration of intravenous contrast. Multiplanar CT image reconstructions and MIPs were obtained to evaluate the vascular anatomy. Carotid stenosis measurements (when applicable) are obtained utilizing NASCET criteria, using the distal  internal carotid diameter as the denominator. Multiphase CT imaging of the brain was performed following IV bolus contrast injection. Subsequent parametric perfusion maps were calculated using RAPID software. CONTRAST:  147mL OMNIPAQUE IOHEXOL 350 MG/ML SOLN COMPARISON:  Head CT same day.  CT angiography 08/24/2017. FINDINGS: CTA NECK FINDINGS Aortic arch: There is aortic atherosclerosis. The scan does not include the brachiocephalic vessel origins other than the left subclavian origin which is widely patent. Right carotid system: Common carotid artery widely patent to the bifurcation. Atherosclerotic disease at the carotid bifurcation and ICA bulb but no stenosis of the bulb beyond that of more distal cervical ICA. Left carotid system: Common carotid artery widely patent to the bifurcation. Atherosclerotic plaque at the carotid bifurcation and ICA bulb. Minimal diameter of the ICA bulb is 4 mm. Compared to a more distal cervical ICA diameter of 4.2 mm, this does not represent any significant stenosis. Vertebral arteries: 30% stenosis at both vertebral artery origins. Beyond the origins, both vessels are widely patent through the cervical region to the foramen magnum. Skeleton: Ordinary cervical spondylosis and facet osteoarthritis. Other neck: No mass or lymphadenopathy. Upper chest: Scarring and emphysematous changes at the apices. No acute process. Review of the MIP images confirms the above findings CTA HEAD FINDINGS Anterior circulation: Internal carotid arteries are patent through the carotid siphon regions, but there is severe atherosclerotic narrowing and irregularity, particularly on the left where there appears to be greater than 80% stenosis. Stenosis on the right estimated at 50-70%. Supraclinoid internal carotid arteries are widely patent. Both anterior cerebral arteries are widely patent. There is atherosclerotic disease of both M1 segments, 50% stenosis on the right and serial irregular 50-70%  stenoses on the left. I do not see any acute distal large or medium vessel occlusion however. Posterior circulation: Dominant right vertebral artery widely patent to the basilar. Small left vertebral artery largely terminates in PICA. Mild narrowing and irregularity of the basilar without flow limiting stenosis. Superior cerebellar and posterior cerebral arteries are patent. 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 Severe stenosis of the left P1 segment. Venous sinuses: Patent and normal. Anatomic variants: None significant. 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 No sign of acute infarction by head CT. CBF (<30%) Volume: 86mL Perfusion (Tmax>6.0s) volume: 11mL Mismatch Volume: 26mL Infarction Location:None IMPRESSION: No acute large vessel occlusion. Atherosclerotic disease at both carotid bifurcations. No significant stenosis. 30% stenosis at both vertebral artery origins. Severe atherosclerotic disease in both carotid siphon regions, worse on the left than the right. On the left, stenosis appears to be greater than 80%. On the right, stenosis is estimated at 50-70%. Atherosclerotic disease of both M1 segments. 50% stenosis on the right. Serial irregular 50-70% stenoses on the left. Atherosclerotic narrowing of both PCA branches as seen previously, with a severe stenosis of the P1 segment on the left. Electronically Signed   By: Nelson Chimes M.D.   On: 12/16/2019 15:57   DG Chest Port 1 View  Result Date: 12/17/2019 CLINICAL DATA:  Altered mental status. EXAM: PORTABLE CHEST 1 VIEW COMPARISON:  Chest x-ray 12/18/2018 and chest CT dated 11/05/2014  FINDINGS: The heart size and mediastinal contours are within normal limits. Aortic atherosclerosis. Both lungs are clear. The visualized skeletal structures are unremarkable. IMPRESSION: 1. No active cardiopulmonary disease.  2.  Aortic Atherosclerosis (ICD10-I70.0). Electronically Signed   By: Lorriane Shire M.D.   On: 12/17/2019 14:06   EEG adult  Result Date: 12/17/2019 [name], MD     12/17/2019 11:55 AM Patient Name: Ronald Wolf MRN: [id number] Epilepsy Attending: [name] Referring Physician/Provider: Dr. Karmen Bongo Date: 12/17/2019 Duration: 28.50 minutes Patient history: 74 year old male with left MCA infarct presented with seizure-like episode.  EEG to evaluate for seizures. Level of alertness: Awake AEDs during EEG study: Keppra Technical aspects: This EEG study was done with scalp electrodes positioned according to the 10-20 International system of electrode placement. Electrical activity was acquired at a sampling rate of 500Hz  and reviewed with a high frequency filter of 70Hz  and a low frequency filter of 1Hz . EEG data were recorded continuously and digitally stored. Description: During awake state, no clear posterior dominant rhythm was seen.  EEG showed continuous generalized, maximal left frontotemporal 3 to 5 Hz slowing.  Sharp waves were also seen in left frontotemporal region.  Hyperventilation and photic stimulation were not performed. Abnormality -Continuous slow, generalized, maximal left frontotemporal -Sharp waves, left frontotemporal IMPRESSION: This study showed evidence of epileptogenicity and cortical dysfunction in left frontotemporal region likely secondary to underlying encephalomalacia.  Additionally there is evidence of mild to moderate diffuse encephalopathy, nonspecific etiology.  No seizures were seen throughout the recording. [name]   ECHOCARDIOGRAM COMPLETE  Result Date: 12/17/2019   ECHOCARDIOGRAM REPORT   Patient Name:   Ronald Wolf Date of Exam: 12/17/2019 Medical Rec #:  [id number]     Height:       69.0 in Accession #:    [id number]    Weight:       155.7 lb Date of Birth:  1946/07/22      BSA:          1.86 m Patient Age:    56 years      BP:           187/94  mmHg Patient Gender: M             HR:           118 bpm. Exam Location:  Inpatient Procedure: 2D Echo Indications:    Stroke 434.91/I163.9  History:        Patient has prior history of Echocardiogram examinations, most                 recent 12/18/2018. Risk Factors:Hypertension. H/o CVA.  Sonographer:    Clayton Lefort RDCS (AE) Referring Phys: [id number] JENNIFER YATES  Sonographer Comments: Image acquisition challenging due to uncooperative patient and Image acquisition challenging due to patient behavioral factors. Patient increasingly uncooperative as echocardiogram progressed. Patient continued to use left arm to push against my left arm as I scanned, making image acquisition challenging. Patient did not respond to requests to keep arm at left side and image acquisition became impossible. IMPRESSIONS  1. Left ventricular ejection fraction, by visual estimation, is 60 to 65%. The left ventricle has normal function. There is moderately increased left ventricular hypertrophy.  2. Left ventricular diastolic parameters are consistent with Grade I diastolic dysfunction (impaired relaxation).  3. The left ventricle has no regional wall motion abnormalities.  4. Global right ventricle has normal systolic function.The right ventricular size is normal. No increase  in right ventricular wall thickness.  5. Left atrial size was normal.  6. Right atrial size was normal.  7. The mitral valve is normal in structure. No evidence of mitral valve regurgitation.  8. The tricuspid valve is normal in structure.  9. The aortic valve is normal in structure. Aortic valve regurgitation is not visualized. 10. The pulmonic valve was normal in structure. Pulmonic valve regurgitation is not visualized. 11. Patient would not cooperate with completion of echo. 12. The atrial septum is grossly normal. FINDINGS  Left Ventricle: Left ventricular ejection fraction, by visual estimation, is 60 to 65%. The left ventricle has normal function. The left  ventricle has no regional wall motion abnormalities. There is moderately increased left ventricular hypertrophy. Left ventricular diastolic parameters are consistent with Grade I diastolic dysfunction (impaired relaxation). Right Ventricle: The right ventricular size is normal. No increase in right ventricular wall thickness. Global RV systolic function is has normal systolic function. Left Atrium: Left atrial size was normal in size. Right Atrium: Right atrial size was normal in size Pericardium: There is no evidence of pericardial effusion. Mitral Valve: The mitral valve is normal in structure. No evidence of mitral valve regurgitation. MV peak gradient, 10.1 mmHg. Tricuspid Valve: The tricuspid valve is normal in structure. Tricuspid valve regurgitation is not demonstrated. Aortic Valve: The aortic valve is normal in structure. Aortic valve regurgitation is not visualized. Aortic valve mean gradient measures 4.0 mmHg. Aortic valve peak gradient measures 6.1 mmHg. Aortic valve area, by VTI measures 3.15 cm. Pulmonic Valve: The pulmonic valve was normal in structure. Pulmonic valve regurgitation is not visualized. Pulmonic regurgitation is not visualized. Aorta: The aortic root and ascending aorta are structurally normal, with no evidence of dilitation. IAS/Shunts: The atrial septum is grossly normal. Additional Comments: Patient would not cooperate with completion of echo.  LEFT VENTRICLE PLAX 2D LVIDd:         3.80 cm  Diastology LVIDs:         2.70 cm  LV e' lateral:   14.60 cm/s LV PW:         1.30 cm  LV E/e' lateral: 3.5 LV IVS:        1.60 cm  LV e' medial:    4.57 cm/s LVOT diam:     2.20 cm  LV E/e' medial:  11.1 LV SV:         35 ml LV SV Index:   18.81 LVOT Area:     3.80 cm  RIGHT VENTRICLE TAPSE (M-mode): 1.9 cm LEFT ATRIUM           Index LA diam:      4.30 cm 2.32 cm/m LA Vol (A4C): 52.8 ml 28.43 ml/m  AORTIC VALVE AV Area (Vmax):    3.46 cm AV Area (Vmean):   3.02 cm AV Area (VTI):     3.15  cm AV Vmax:           123.00 cm/s AV Vmean:          94.000 cm/s AV VTI:            0.210 m AV Peak Grad:      6.1 mmHg AV Mean Grad:      4.0 mmHg LVOT Vmax:         112.00 cm/s LVOT Vmean:        74.800 cm/s LVOT VTI:          0.174 m LVOT/AV VTI ratio: 0.83  AORTA Ao Root diam:  2.90 cm MITRAL VALVE MV Area (PHT): 5.88 cm              SHUNTS MV Peak grad:  10.1 mmHg             Systemic VTI:  0.17 m MV Mean grad:  3.0 mmHg              Systemic Diam: 2.20 cm MV Vmax:       1.59 m/s MV Vmean:      78.0 cm/s MV VTI:        0.26 m MV PHT:        37.41 msec MV Decel Time: 129 msec MV E velocity: 50.70 cm/s  103 cm/s MV A velocity: 150.00 cm/s 70.3 cm/s MV E/A ratio:  0.34        1.5  Mertie Moores MD Electronically signed by Mertie Moores MD Signature Date/Time: 12/17/2019/2:23:44 PM    Final    CT HEAD CODE STROKE WO CONTRAST  Result Date: 12/16/2019 CLINICAL DATA:  Code stroke. Right sided arm weakness, gait disturbance and abnormal gaze. EXAM: CT HEAD WITHOUT CONTRAST TECHNIQUE: Contiguous axial images were obtained from the base of the skull through the vertex without intravenous contrast. COMPARISON:  MRI 12/18/2018 FINDINGS: Brain: No abnormality is seen affecting the brainstem or cerebellum. Cerebral hemispheres show atrophy with chronic small-vessel ischemic changes of the white matter. There is old infarction in the left middle cerebral artery territory with encephalomalacia and gliosis. This is progressive compared to the study of last year, particularly in the frontal region and left basal ganglia/external capsule. No finding typical of acute insult. No mass, hemorrhage, hydrocephalus or extra-axial collection. Vascular: There is atherosclerotic calcification of the major vessels at the base of the brain. Skull: Negative Sinuses/Orbits: Clear/normal Other: None ASPECTS (Dean Stroke Program Early CT Score) difficult to apply the setting of extensive old left MCA territory infarction. No sign of acute  infarction suspected. IMPRESSION: 1. Extensive old ischemic changes in the left MCA territory. No sign of acute infarction. Compared to the study of last January, findings are progressive particularly in the left frontal region, basal ganglia and external capsule region. 2. No sign of hemorrhage. 3. These results were communicated to Dr. Rory Percy at 3:29 pmon 1/3/2021by text page via the [facility] messaging system. Electronically Signed   By: Nelson Chimes M.D.   On: 12/16/2019 15:30     Labs:   Basic Metabolic Panel: Recent Labs  Lab 12/27/19 0548 12/31/19 0221  NA 142 138  K 4.3 4.4  CL 108 107  CO2 25 21*  GLUCOSE 98 113*  BUN 22 34*  CREATININE 1.02 1.21  CALCIUM 9.0 9.0  MG 2.5* 2.3   GFR Estimated Creatinine Clearance: 47.8 mL/min (by C-G formula based on SCr of 1.21 mg/dL). Liver Function Tests: Recent Labs  Lab 12/27/19 0548  AST 44*  ALT 63*  ALKPHOS 92  BILITOT 0.7  PROT 6.7  ALBUMIN 2.8*   No results for input(s): LIPASE, AMYLASE in the last 168 hours. No results for input(s): AMMONIA in the last 168 hours. Coagulation profile No results for input(s): INR, PROTIME in the last 168 hours.  CBC: Recent Labs  Lab 12/27/19 0548 12/31/19 0221  WBC 12.4* 11.4*  HGB 11.3* 9.7*  HCT 35.8* 30.2*  MCV 110.2* 108.6*  PLT 490* 505*   Cardiac Enzymes: No results for input(s): CKTOTAL, CKMB, CKMBINDEX, TROPONINI in the last 168 hours. BNP: Invalid input(s): POCBNP CBG: Recent Labs  Lab  12/29/19 1539  GLUCAP 108*   D-Dimer No results for input(s): DDIMER in the last 72 hours. Hgb A1c No results for input(s): HGBA1C in the last 72 hours. Lipid Profile No results for input(s): CHOL, HDL, LDLCALC, TRIG, CHOLHDL, LDLDIRECT in the last 72 hours. Thyroid function studies No results for input(s): TSH, T4TOTAL, T3FREE, THYROIDAB in the last 72 hours.  Invalid input(s): FREET3 Anemia work up No results for input(s): VITAMINB12, FOLATE, FERRITIN, TIBC, IRON, RETICCTPCT  in the last 72 hours. Microbiology Recent Results (from the past 240 hour(s))  SARS CORONAVIRUS 2 (TAT 6-24 HRS) Nasopharyngeal Nasopharyngeal Swab     Status: None   Collection Time: 12/31/19  9:40 AM   Specimen: Nasopharyngeal Swab  Result Value Ref Range Status   SARS Coronavirus 2 NEGATIVE NEGATIVE Final    Comment: (NOTE) SARS-CoV-2 target nucleic acids are NOT DETECTED. The SARS-CoV-2 RNA is generally detectable in upper and lower respiratory specimens during the acute phase of infection. Negative results do not preclude SARS-CoV-2 infection, do not rule out co-infections with other pathogens, and should not be used as the sole basis for treatment or other patient management decisions. Negative results must be combined with clinical observations, patient history, and epidemiological information. The expected result is Negative. Fact Sheet for Patients: SugarRoll.be Fact Sheet for Healthcare Providers: https://www.woods-mathews.com/ This test is not yet approved or cleared by the [place] FDA and  has been authorized for detection and/or diagnosis of SARS-CoV-2 by FDA under an Emergency Use Authorization (EUA). This EUA will remain  in effect (meaning this test can be used) for the duration of the COVID-19 declaration under Section 56 4(b)(1) of the Act, 21 U.S.C. section 360bbb-3(b)(1), unless the authorization is terminated or revoked sooner. Performed at [facility], [place] [address], [place], [place] [address]      Discharge Instructions:   Discharge Instructions    Diet - low sodium heart healthy   Complete by: As directed    Dysphagia I diet- advance as per speech therapy   Discharge instructions   Complete by: As directed    Follow up with your primary care provider at skilled nursing facility in 3 to 5 days.  Follow[facility]up with [facility] neurology as outpatient in 3-4 weeks.   Increase activity slowly   Complete by:  As directed    As per physical therapy     Allergies as of 01/01/2020      Reactions   Iohexol Hives    Desc: pt broke out in hives years ago from iv contrast. he was given 50mg  benadryl po, 1 hr prior to ct and did fine today w/o complications.  JB      Medication List    STOP taking these medications   metoprolol tartrate 25 MG tablet Commonly known as: LOPRESSOR     TAKE these medications   amLODipine 10 MG tablet Commonly known as: NORVASC Take 1 tablet (10 mg total) by mouth daily. Start taking on: January 02, 2020   aspirin EC 81 MG tablet Take 81 mg by mouth daily.   atorvastatin 80 MG tablet Commonly known as: LIPITOR TAKE 1 TABLET(80 MG) BY MOUTH DAILY AT 6 PM What changed: See the new instructions.   carvedilol 3.125 MG tablet Commonly known as: COREG Take 1 tablet (3.125 mg total) by mouth 2 (two) times daily with a meal.   clopidogrel 75 MG tablet Commonly known as: PLAVIX TAKE 1 TABLET(75 MG) BY MOUTH DAILY What changed: See the new  instructions.   folic acid 1 MG tablet Commonly known as: FOLVITE Take 1 tablet (1 mg total) by mouth daily. Start taking on: January 02, 2020   levETIRAcetam 750 MG tablet Commonly known as: Keppra Take 1 tablet (750 mg total) by mouth 2 (two) times daily.   multivitamin with minerals Tabs tablet Take 1 tablet by mouth daily. Start taking on: January 02, 2020   nicotine 21 mg/24hr patch Commonly known as: Nicoderm CQ Place 1 patch (21 mg total) onto the skin daily.   Nutritional Supplement Liqd Take 120 mLs by mouth daily. MedPass   nystatin 100000 UNIT/ML suspension Commonly known as: MYCOSTATIN Take 5 mLs (500,000 Units total) by mouth 4 (four) times daily.   polyethylene glycol 17 g packet Commonly known as: MIRALAX / GLYCOLAX Take 17 g by mouth daily as needed for mild constipation or moderate constipation.   tetrahydrozoline-zinc 0.05-0.25 % ophthalmic solution Commonly known as: VISINE-AC Place 1  drop into both eyes daily as needed (dry eyes).   thiamine 100 MG tablet Take 1 tablet (100 mg total) by mouth daily. Start taking on: January 02, 2020       Contact information for follow-up providers    [facility]. Schedule an appointment as soon as possible for a visit.   Why: stroke follow up in 3-4 weeks Contact information: [address]     [address] [place] [phone]           Contact information for after-discharge care    Destination    [facility] Preferred SNF .   Service: Skilled Nursing Contact information: [address] [place] [phone]                   Time coordinating discharge: 39 minutes  Signed:  Yiannis Tulloch  [facility] 01/01/2020, 11:43 AM

## 2020-01-01 NOTE — Progress Notes (Signed)
Report called to RN Jessica at [facility] at 1230. Notified by SW that pt would be discharging today but would be going to a different room than was told earlier in the day. This RN called back facility to verify that the correct RN had received report since the patient would be in a different hallway at new facility. They confirmed report received was sufficient. Awaiting PTAR for transport.

## 2020-01-01 NOTE — TOC Progression Note (Cosign Needed Addendum)
Transition of Care [facility]) - Progression Note    Patient Details  Name: Ronald Wolf MRN: [id number] Date of Birth: 13-Aug-1946  Transition of Care [facility]) CM/SW [place], [place] Work Phone Number: 01/01/2020, 3:03 PM  Clinical Narrative:    MSW Intern spoke with [facility]. They are now, no longer able to take Pt tonight, and are planning on taking him tomorrow morning. All parties notified, SW will continue to follow.  Adended 3:22 PM [facility] now able to receive PT.   Expected Discharge Plan: IP Rehab Facility Barriers to Discharge: Barriers Resolved  Expected Discharge Plan and Services Expected Discharge Plan: [place] In-house Referral: Clinical Social Work Discharge Planning Services: CM Consult Post Acute Care Choice: IP Rehab   Expected Discharge Date: 01/01/20                                     Social Determinants of Health (SDOH) Interventions    Readmission Risk Interventions No flowsheet data found.

## 2020-01-01 NOTE — Progress Notes (Signed)
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 Acute stroke ruled out by MRI.  Admitted for suspected seizures, confirmed by EEG. On 1/5, treated with aggressive IVF per sepsis protocol, developed congested cough and IV changed to NSL.   OT comments  Pt progressing toward established goals. He demonstrates good progress with strength and ambulating reqiuring minA for in room mobility at RW level. He continues to demonstrate cognitive limitations with attention, problem solving requiring hand over hand assistance to initiate applying deodorant. Per verbal conversation with pt's daughter, at baseline, pt was using a RW for community mobility and required assistance with all ADL. She reports he did not have many interests but enjoyed watching NASCAR and tinkering with old toys. Daughter reports she will plan to bring familiar items for pt to use. Pt will continue to benefit from skilled OT services to maximize safety and independence with ADL/IADL and functional mobility. Will continue to follow acutely and progress as tolerated.     Follow Up Recommendations  SNF    Equipment Recommendations  3 in 1 bedside commode    Recommendations for Other Services      Precautions / Restrictions Precautions Precautions: Fall Precaution Comments: seizure Restrictions Weight Bearing Restrictions: No       Mobility Bed Mobility Overal bed mobility: Needs Assistance Bed Mobility: Supine to Sit     Supine to sit: Mod assist     General bed mobility comments: pt able to advance BLE to  EOB, modA to progress trunk to upright position  Transfers Overall transfer level: Needs assistance Equipment used: Rolling walker (2 wheeled) Transfers: Sit to/from Stand Sit to Stand: Mod assist;Min assist         General transfer comment: Cues for hand placement.continues to pull on RW toi achieve standing.     Balance Overall balance assessment: Needs assistance Sitting-balance support: Single extremity supported;Feet supported Sitting balance-Leahy Scale: Fair   Postural control: Right lateral lean Standing balance support: Bilateral upper extremity supported Standing balance-Leahy Scale: Poor Standing balance comment: unable to stand with single UE support                           ADL either performed or assessed with clinical judgement   ADL Overall ADL's : Needs assistance/impaired Eating/Feeding: Set up;Sitting Eating/Feeding Details (indicate cue type and reason): full supervision  Grooming: Cueing for sequencing;Moderate assistance;Oral care;Applying deodorant Grooming Details (indicate cue type and reason): modA for hand over hand assistance to initiate task, demonstrates difficulty with motor plannning                 Toilet Transfer: Moderate assistance Toilet Transfer Details (indicate cue type and reason): modA for safe hand placement, minA for stability at RW level, ambulated to the sink  Toileting- Clothing Manipulation and Hygiene: Total assistance Toileting - Clothing Manipulation Details (indicate cue type and reason): pt soiled upon arrival, totalA for posterior pericare     Functional mobility during ADLs: Moderate assistance;Rolling walker;Minimal assistance General ADL Comments: limited by impaired cognition      Vision   Vision Assessment?: Vision impaired- to be further  tested in functional context   Perception     Praxis      Cognition Arousal/Alertness: Awake/alert Behavior During Therapy: Flat affect Overall  Cognitive Status: Impaired/Different from baseline Area of Impairment: Orientation;Attention;Memory;Following commands;Safety/judgement;Awareness;Problem solving                 Orientation Level: Disoriented to;Place;Time;Situation Current Attention Level: Focused Memory: Decreased recall of precautions;Decreased short-term memory Following Commands: Follows one step commands inconsistently;Follows one step commands with increased time Safety/Judgement: Decreased awareness of safety;Decreased awareness of deficits Awareness: Intellectual Problem Solving: Slow processing;Decreased initiation;Difficulty sequencing;Requires verbal cues;Requires tactile cues General Comments: pt demonstrates difficulty with motor planning, requires increased processing (about 1 min, but is inconsistent)and inconsistently answers questions and inconsistently follows one-step commands;appears to respond quicker with visual cue and visual prompting;he requires hand over hand assistance to initiate tasks        Exercises     Shoulder Instructions       General Comments HR up to 122bpm standing sink    Pertinent Vitals/ Pain       Pain Assessment: No/denies pain Pain Intervention(s): Monitored during session  Home Living                                          Prior Functioning/Environment              Frequency  Min 2X/week        Progress Toward Goals  OT Goals(current goals can now be found in the care plan section)  Progress towards OT goals: Progressing toward goals  Acute Rehab OT Goals Patient Stated Goal: pt did not state OT Goal Formulation: With patient Time For Goal Achievement: 01/02/20 Potential to Achieve Goals: Good ADL Goals Pt Will Perform Eating: with supervision;sitting Pt Will Perform Grooming: with supervision;sitting Pt Will Transfer to Toilet: ambulating;with supervision Additional ADL Goal #1: Pt will follow multi-step commands  consistently during ADL.  Plan Discharge plan remains appropriate    Co-evaluation                 AM-PAC OT "6 Clicks" Daily Activity     Outcome Measure   Help from another person eating meals?: A Little Help from another person taking care of personal grooming?: A Lot Help from another person toileting, which includes using toliet, bedpan, or urinal?: A Lot Help from another person bathing (including washing, rinsing, drying)?: A Lot Help from another person to put on and taking off regular upper body clothing?: A Lot Help from another person to put on and taking off regular lower body clothing?: A Lot 6 Click Score: 13    End of Session Equipment Utilized During Treatment: Gait belt;Rolling walker  OT Visit Diagnosis: Unsteadiness on feet (R26.81);Other abnormalities of gait and mobility (R26.89);Muscle weakness (generalized) (M62.81);Feeding difficulties (R63.3);Other symptoms and signs involving cognitive function   Activity Tolerance Patient tolerated treatment well   Patient Left in chair;with call bell/phone within reach;with chair alarm set   Nurse Communication Mobility status        Time: [id number] OT Time Calculation (min): 36 min  Charges: OT General Charges $OT Visit: 1 Visit OT Treatments $Self Care/Home Management : 23-37 mins  [place] Office: [place] 01/01/2020, 12:47 PM

## 2020-01-01 NOTE — TOC Transition Note (Cosign Needed Addendum)
Transition of Care [facility]) - CM/SW Discharge Note   Patient Details  Name: EIDAN DRUMMONDS MRN: [id number] Date of Birth: [birth year]  [facility]) CM/SW Contact:  Kirstie Peri, [place] Work Phone Number: 01/01/2020, 12:18 PM   Clinical Narrative:    Nurse to call report to [phone], ask for the 800 hall nurse. Room # 810      Final next level of care: Skilled Nursing Facility Barriers to Discharge: Barriers Resolved   Patient Goals and CMS Choice   CMS Medicare.gov Compare Post Acute Care list provided to:: Patient Represenative (must comment)    Discharge Placement              Patient chooses bed at: [facility] [place] Patient to be transferred to facility by: [place] Name of family member notified: Helene Kelp Patient and family notified of of transfer: 01/01/20  Discharge Plan and Services In-house Referral: Clinical Social Work Discharge Planning Services: CM Consult Post Acute Care Choice: IP Rehab                               Social Determinants of Health (SDOH) Interventions     Readmission Risk Interventions No flowsheet data found.

## 2020-01-05 DIAGNOSIS — Z8673 Personal history of transient ischemic attack (TIA), and cerebral infarction without residual deficits: Secondary | ICD-10-CM | POA: Diagnosis not present

## 2020-01-05 DIAGNOSIS — Z720 Tobacco use: Secondary | ICD-10-CM | POA: Diagnosis not present

## 2020-01-05 DIAGNOSIS — R569 Unspecified convulsions: Secondary | ICD-10-CM | POA: Diagnosis not present

## 2020-01-05 DIAGNOSIS — I10 Essential (primary) hypertension: Secondary | ICD-10-CM | POA: Diagnosis not present

## 2020-01-05 DIAGNOSIS — E785 Hyperlipidemia, unspecified: Secondary | ICD-10-CM | POA: Diagnosis not present

## 2020-01-05 DIAGNOSIS — A419 Sepsis, unspecified organism: Secondary | ICD-10-CM | POA: Diagnosis not present

## 2020-01-11 DIAGNOSIS — I10 Essential (primary) hypertension: Secondary | ICD-10-CM | POA: Diagnosis not present

## 2020-01-11 DIAGNOSIS — Z720 Tobacco use: Secondary | ICD-10-CM | POA: Diagnosis not present

## 2020-01-11 DIAGNOSIS — Z8673 Personal history of transient ischemic attack (TIA), and cerebral infarction without residual deficits: Secondary | ICD-10-CM | POA: Diagnosis not present

## 2020-01-11 DIAGNOSIS — E785 Hyperlipidemia, unspecified: Secondary | ICD-10-CM | POA: Diagnosis not present

## 2020-01-11 DIAGNOSIS — R569 Unspecified convulsions: Secondary | ICD-10-CM | POA: Diagnosis not present

## 2020-01-11 DIAGNOSIS — A419 Sepsis, unspecified organism: Secondary | ICD-10-CM | POA: Diagnosis not present

## 2020-01-24 DIAGNOSIS — F329 Major depressive disorder, single episode, unspecified: Secondary | ICD-10-CM | POA: Diagnosis not present

## 2020-01-24 DIAGNOSIS — F419 Anxiety disorder, unspecified: Secondary | ICD-10-CM | POA: Diagnosis not present

## 2020-01-24 DIAGNOSIS — I69391 Dysphagia following cerebral infarction: Secondary | ICD-10-CM | POA: Diagnosis not present

## 2020-01-24 DIAGNOSIS — G40909 Epilepsy, unspecified, not intractable, without status epilepticus: Secondary | ICD-10-CM | POA: Diagnosis not present

## 2020-01-24 DIAGNOSIS — I69398 Other sequelae of cerebral infarction: Secondary | ICD-10-CM | POA: Diagnosis not present

## 2020-01-24 DIAGNOSIS — G500 Trigeminal neuralgia: Secondary | ICD-10-CM | POA: Diagnosis not present

## 2020-01-24 DIAGNOSIS — I69320 Aphasia following cerebral infarction: Secondary | ICD-10-CM | POA: Diagnosis not present

## 2020-01-24 DIAGNOSIS — I10 Essential (primary) hypertension: Secondary | ICD-10-CM | POA: Diagnosis not present

## 2020-01-24 DIAGNOSIS — F1510 Other stimulant abuse, uncomplicated: Secondary | ICD-10-CM | POA: Diagnosis not present

## 2020-01-27 DIAGNOSIS — F1510 Other stimulant abuse, uncomplicated: Secondary | ICD-10-CM | POA: Diagnosis not present

## 2020-01-27 DIAGNOSIS — I10 Essential (primary) hypertension: Secondary | ICD-10-CM | POA: Diagnosis not present

## 2020-01-27 DIAGNOSIS — I69320 Aphasia following cerebral infarction: Secondary | ICD-10-CM | POA: Diagnosis not present

## 2020-01-27 DIAGNOSIS — F329 Major depressive disorder, single episode, unspecified: Secondary | ICD-10-CM | POA: Diagnosis not present

## 2020-01-27 DIAGNOSIS — G40909 Epilepsy, unspecified, not intractable, without status epilepticus: Secondary | ICD-10-CM | POA: Diagnosis not present

## 2020-01-27 DIAGNOSIS — G500 Trigeminal neuralgia: Secondary | ICD-10-CM | POA: Diagnosis not present

## 2020-01-27 DIAGNOSIS — I69391 Dysphagia following cerebral infarction: Secondary | ICD-10-CM | POA: Diagnosis not present

## 2020-01-27 DIAGNOSIS — I69398 Other sequelae of cerebral infarction: Secondary | ICD-10-CM | POA: Diagnosis not present

## 2020-01-27 DIAGNOSIS — F419 Anxiety disorder, unspecified: Secondary | ICD-10-CM | POA: Diagnosis not present

## 2020-01-28 ENCOUNTER — Other Ambulatory Visit: Payer: Self-pay

## 2020-01-28 ENCOUNTER — Ambulatory Visit (INDEPENDENT_AMBULATORY_CARE_PROVIDER_SITE_OTHER): Payer: Medicare PPO | Admitting: Medical

## 2020-01-28 ENCOUNTER — Telehealth: Payer: Self-pay | Admitting: Medical

## 2020-01-28 DIAGNOSIS — I10 Essential (primary) hypertension: Secondary | ICD-10-CM

## 2020-01-28 DIAGNOSIS — Z8673 Personal history of transient ischemic attack (TIA), and cerebral infarction without residual deficits: Secondary | ICD-10-CM | POA: Diagnosis not present

## 2020-01-28 DIAGNOSIS — I69398 Other sequelae of cerebral infarction: Secondary | ICD-10-CM | POA: Diagnosis not present

## 2020-01-28 DIAGNOSIS — I63312 Cerebral infarction due to thrombosis of left middle cerebral artery: Secondary | ICD-10-CM

## 2020-01-28 DIAGNOSIS — G40909 Epilepsy, unspecified, not intractable, without status epilepticus: Secondary | ICD-10-CM | POA: Diagnosis not present

## 2020-01-28 DIAGNOSIS — F419 Anxiety disorder, unspecified: Secondary | ICD-10-CM | POA: Diagnosis not present

## 2020-01-28 DIAGNOSIS — F329 Major depressive disorder, single episode, unspecified: Secondary | ICD-10-CM | POA: Diagnosis not present

## 2020-01-28 DIAGNOSIS — G500 Trigeminal neuralgia: Secondary | ICD-10-CM | POA: Diagnosis not present

## 2020-01-28 DIAGNOSIS — R569 Unspecified convulsions: Secondary | ICD-10-CM

## 2020-01-28 DIAGNOSIS — F1510 Other stimulant abuse, uncomplicated: Secondary | ICD-10-CM | POA: Diagnosis not present

## 2020-01-28 DIAGNOSIS — I69391 Dysphagia following cerebral infarction: Secondary | ICD-10-CM | POA: Diagnosis not present

## 2020-01-28 DIAGNOSIS — I69320 Aphasia following cerebral infarction: Secondary | ICD-10-CM | POA: Diagnosis not present

## 2020-01-28 DIAGNOSIS — R739 Hyperglycemia, unspecified: Secondary | ICD-10-CM | POA: Diagnosis not present

## 2020-01-28 NOTE — Telephone Encounter (Signed)
[phone] Toll free number [phone]  Nurse came over today [phone]  Will you call and see if they would draw cmp and a1c. Dx would be decrased gfr and elevated blood sugar.

## 2020-01-28 NOTE — Patient Instructions (Addendum)
Continue with PT.  For hx of cva, hx of seizure and metabolic encephalopathy will refer you to neurologist for follow up.  Get vitals from homehealth and send me my chart message.  On review of labs recenlty will ask for cmp and a1c to be done by homehealth.  Referral to neurologist place.  Use walker when ambulating. Reduce chance for fall.  Follow up as regular scheduled with pcp or as needed

## 2020-01-28 NOTE — Telephone Encounter (Signed)
Referral to neurologist placed. 

## 2020-01-28 NOTE — Telephone Encounter (Signed)
Left message for nurse to call back to see if they would be able to draw blood.

## 2020-01-28 NOTE — Progress Notes (Signed)
Subjective:    Patient ID: Ronald Wolf, male    DOB: 07-03-1946, 74 y.o.   MRN: [id number]  HPI  Virtual Visit via Telephone Note  I connected with [name] on 01/28/20 at  1:20 PM EST by telephone and verified that I am speaking with the correct person using two identifiers.  Location: Patient: home Provider: office  Pt states home health came by today. Vitals today but girlfirend was there. They will be out again tomorrow. Asking for my chart update tomorrow on bp level and vitals.   I discussed the limitations, risks, security and privacy concerns of performing an evaluation and management service by telephone and the availability of in person appointments. I also discussed with the patient that there may be a patient responsible charge related to this service. The patient expressed understanding and agreed to proceed.   History of Present Illness:  Pt had work up for possible stroke.  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 Patient's hospitalization was complicated by sepsis secondary to UTI.  Hospital Course:  Following conditions were addressed during hospitalization as listed below,  Acute metabolic encephalopathy Metabolic encephalopathy - multifactorial from sepsis, seizures, UTI. MRI was negative for acute process. Improved but has expressive aphasia.  Seizures No further seizures reported.  Patient had a CT angiogram of the brain/neck and MRI of the brain.  There was no evidence of new stroke but old strokes were noted.EEG consistent with epileptogenicity and cortical dysfunction in left frontotemporal  region likely secondary to underlying encephalomalacia from prior CVA along with diffuse encephalopathic changes with no definite seizures. Seen by neurology. On Keppra 750 mg twice daily . Patient will need outpatient neurology follow-up 6-8 weeks after discharge.  Will need monitoring of his carotid stenosis as outpatient.  Aphasia Seen by neurology.Thought to be secondary to hypoperfusion and seizure.  History of vascular dementia.Intermittent aphasia.  Will need to follow-up with neurology as outpatient.  Sepsis Secondary to UTI. Resolved. WBC of 12.4 on 1/14 but no fever. T-max of 98.7.  UTI In setting of urostomy. Completed treatment of Zosyn. Urine culture with no growth  dysphagia Seen by speech therapy on 12/30/2018. Recommend dysphagia 1 diet. Patient able to eat by himself.  He is follow-up with his speech therapy after discharge  History of left MCA stroke MRI was negative for acute stroke this time. Continue Lipitor, Plavix and aspirin.  Carotid siphon stenosis-  Will need outpatient follow-up with vascular surgery.  Hyperlipidemia Continue Lipitor  Essential hypertension Blood pressure is stable. Continue amlodipine and Coreg.   Hypernatremia Resolved.  Tobacco use Continue nicotine patch.  Please wean nicotine patch over time.  History of alcohol abuse continue multivitamin, thiamine and folate on discharge.Currently stable.  No withdrawal symptoms reported to the hospital.  S/p urostomy. Supportive care  Disposition.  At this time, patient is stable for disposition to skilled nursing facility.  Spoke with the patient's sister and updated her about the clinical condition of the patient and the plan for disposition.   After hospital he was discharge to skilled nursing and has been home for 2 weeks. Pt had PT at facility and he has been having PT at home  as well.   Pt daughter states PT has been helping him with gait. He uses walker when  not inside.   Before being admitted to hospital he was using walker very rarely.  Pt had no blood work done recently. Last labs showed some mild sugar elevation and mild decreased gfr.     Observations/Objective: General-  Assessment and Plan:   Follow Up Instructions:    I discussed the assessment and treatment plan with the patient. The patient was provided an opportunity to ask questions and all were answered. The patient agreed with the plan and demonstrated an understanding of the instructions.   The patient was advised to call back or seek an in-person evaluation if the symptoms worsen or if the condition fails to improve as anticipated.  I provided 30  minutes of non-face-to-face time during this encounter.   Mackie Pai, PA-C   Review of Systems     Objective:   Physical Exam        Assessment & Plan:  [phone] Toll free number [phone]  Nurse came over today [phone]  Continue with PT.  For hx of cva, hx of seizure and metabolic encephalopathy will refer you to neurologist for follow up.  Get vitals from homehealth and send me my chart message.  On review of labs recenlty will ask for cmp and a1c to be done by homehealth.  Referral to neurologist place.  Use walker when ambulating. Reduce chance for fall.  Follow up as regular scheduled with pcp or as needed  30 minutes spent with pt today.

## 2020-01-28 NOTE — Telephone Encounter (Signed)
Referral to neurologist placed again. [facility] dismissed pt. So put in new referral.

## 2020-01-29 ENCOUNTER — Telehealth: Payer: Self-pay | Admitting: *Deleted

## 2020-01-29 DIAGNOSIS — R531 Weakness: Secondary | ICD-10-CM

## 2020-01-29 DIAGNOSIS — I63312 Cerebral infarction due to thrombosis of left middle cerebral artery: Secondary | ICD-10-CM

## 2020-01-29 DIAGNOSIS — H40013 Open angle with borderline findings, low risk, bilateral: Secondary | ICD-10-CM | POA: Diagnosis not present

## 2020-01-29 NOTE — Telephone Encounter (Signed)
I am fine with both. I will sign and return to you.

## 2020-01-29 NOTE — Telephone Encounter (Signed)
[facility] faxed an order for 3 in 1 commode to help  With toileting due to weakness from previous cva and rw for help with ambulation.  Are you ok with this?  If yes, please send back to me so I can get this done and faxed over to [facility].

## 2020-01-29 NOTE — Telephone Encounter (Signed)
Ronald Wolf from [facility] call and they will draw on Thursday.

## 2020-01-30 DIAGNOSIS — I69398 Other sequelae of cerebral infarction: Secondary | ICD-10-CM | POA: Diagnosis not present

## 2020-01-30 DIAGNOSIS — G500 Trigeminal neuralgia: Secondary | ICD-10-CM | POA: Diagnosis not present

## 2020-01-30 DIAGNOSIS — F1510 Other stimulant abuse, uncomplicated: Secondary | ICD-10-CM | POA: Diagnosis not present

## 2020-01-30 DIAGNOSIS — G40909 Epilepsy, unspecified, not intractable, without status epilepticus: Secondary | ICD-10-CM | POA: Diagnosis not present

## 2020-01-30 DIAGNOSIS — I69320 Aphasia following cerebral infarction: Secondary | ICD-10-CM | POA: Diagnosis not present

## 2020-01-30 DIAGNOSIS — F329 Major depressive disorder, single episode, unspecified: Secondary | ICD-10-CM | POA: Diagnosis not present

## 2020-01-30 DIAGNOSIS — I69391 Dysphagia following cerebral infarction: Secondary | ICD-10-CM | POA: Diagnosis not present

## 2020-01-30 DIAGNOSIS — F419 Anxiety disorder, unspecified: Secondary | ICD-10-CM | POA: Diagnosis not present

## 2020-01-30 DIAGNOSIS — I10 Essential (primary) hypertension: Secondary | ICD-10-CM | POA: Diagnosis not present

## 2020-01-30 NOTE — Telephone Encounter (Addendum)
Order put in and faxed to [facility] for commode.

## 2020-02-04 ENCOUNTER — Other Ambulatory Visit: Payer: Self-pay

## 2020-02-04 ENCOUNTER — Telehealth: Payer: Self-pay | Admitting: Family Medicine

## 2020-02-04 DIAGNOSIS — I10 Essential (primary) hypertension: Secondary | ICD-10-CM | POA: Diagnosis not present

## 2020-02-04 DIAGNOSIS — I69320 Aphasia following cerebral infarction: Secondary | ICD-10-CM | POA: Diagnosis not present

## 2020-02-04 DIAGNOSIS — F1510 Other stimulant abuse, uncomplicated: Secondary | ICD-10-CM | POA: Diagnosis not present

## 2020-02-04 DIAGNOSIS — F329 Major depressive disorder, single episode, unspecified: Secondary | ICD-10-CM | POA: Diagnosis not present

## 2020-02-04 DIAGNOSIS — F419 Anxiety disorder, unspecified: Secondary | ICD-10-CM | POA: Diagnosis not present

## 2020-02-04 DIAGNOSIS — I69391 Dysphagia following cerebral infarction: Secondary | ICD-10-CM | POA: Diagnosis not present

## 2020-02-04 DIAGNOSIS — I63312 Cerebral infarction due to thrombosis of left middle cerebral artery: Secondary | ICD-10-CM

## 2020-02-04 DIAGNOSIS — I69398 Other sequelae of cerebral infarction: Secondary | ICD-10-CM | POA: Diagnosis not present

## 2020-02-04 DIAGNOSIS — G500 Trigeminal neuralgia: Secondary | ICD-10-CM | POA: Diagnosis not present

## 2020-02-04 DIAGNOSIS — G40909 Epilepsy, unspecified, not intractable, without status epilepticus: Secondary | ICD-10-CM | POA: Diagnosis not present

## 2020-02-04 NOTE — Telephone Encounter (Signed)
Caller/Agency: Minda w/[facility] Callback Number: [phone] Requesting OT/PT/Skilled Nursing/Social Work/Speech Therapy: ST Frequency: requesting VO 2 x week for 4 weeks

## 2020-02-04 NOTE — Telephone Encounter (Signed)
Pt's Dtr. [name], called and stated pt had a VOV with Edward last Monday.  She also stated pt is in need of refills on all his scripts.  He only has one day left on all his meds.  Pharmacy is [facility] at [address].

## 2020-02-05 ENCOUNTER — Telehealth: Payer: Self-pay | Admitting: Family Medicine

## 2020-02-05 MED ORDER — ATORVASTATIN CALCIUM 80 MG PO TABS: ORAL_TABLET | ORAL | 1 refills | Status: DC

## 2020-02-05 MED ORDER — CLOPIDOGREL BISULFATE 75 MG PO TABS: ORAL_TABLET | ORAL | 1 refills | Status: DC

## 2020-02-05 NOTE — Telephone Encounter (Signed)
Refilled meds PCP prescribes

## 2020-02-05 NOTE — Telephone Encounter (Signed)
Called patient unable to leave voicemail  

## 2020-02-05 NOTE — Telephone Encounter (Signed)
Called and could not leave a message because mailbox was full

## 2020-02-05 NOTE — Telephone Encounter (Signed)
Called [facility] informed of PCP verbal ok 

## 2020-02-05 NOTE — Telephone Encounter (Signed)
FYI

## 2020-02-05 NOTE — Telephone Encounter (Signed)
Per Jackelyn Poling with / [facility] , states that she went by patient's home and no one came to the door.

## 2020-02-06 DIAGNOSIS — F1510 Other stimulant abuse, uncomplicated: Secondary | ICD-10-CM | POA: Diagnosis not present

## 2020-02-06 DIAGNOSIS — I10 Essential (primary) hypertension: Secondary | ICD-10-CM | POA: Diagnosis not present

## 2020-02-06 DIAGNOSIS — I69320 Aphasia following cerebral infarction: Secondary | ICD-10-CM | POA: Diagnosis not present

## 2020-02-06 DIAGNOSIS — I69398 Other sequelae of cerebral infarction: Secondary | ICD-10-CM | POA: Diagnosis not present

## 2020-02-06 DIAGNOSIS — F419 Anxiety disorder, unspecified: Secondary | ICD-10-CM | POA: Diagnosis not present

## 2020-02-06 DIAGNOSIS — G40909 Epilepsy, unspecified, not intractable, without status epilepticus: Secondary | ICD-10-CM | POA: Diagnosis not present

## 2020-02-06 DIAGNOSIS — G500 Trigeminal neuralgia: Secondary | ICD-10-CM | POA: Diagnosis not present

## 2020-02-06 DIAGNOSIS — I69391 Dysphagia following cerebral infarction: Secondary | ICD-10-CM | POA: Diagnosis not present

## 2020-02-06 DIAGNOSIS — F329 Major depressive disorder, single episode, unspecified: Secondary | ICD-10-CM | POA: Diagnosis not present

## 2020-02-11 ENCOUNTER — Telehealth: Payer: Self-pay

## 2020-02-11 DIAGNOSIS — G500 Trigeminal neuralgia: Secondary | ICD-10-CM | POA: Diagnosis not present

## 2020-02-11 DIAGNOSIS — I69320 Aphasia following cerebral infarction: Secondary | ICD-10-CM | POA: Diagnosis not present

## 2020-02-11 DIAGNOSIS — I10 Essential (primary) hypertension: Secondary | ICD-10-CM | POA: Diagnosis not present

## 2020-02-11 DIAGNOSIS — F329 Major depressive disorder, single episode, unspecified: Secondary | ICD-10-CM | POA: Diagnosis not present

## 2020-02-11 DIAGNOSIS — F1510 Other stimulant abuse, uncomplicated: Secondary | ICD-10-CM | POA: Diagnosis not present

## 2020-02-11 DIAGNOSIS — G40909 Epilepsy, unspecified, not intractable, without status epilepticus: Secondary | ICD-10-CM | POA: Diagnosis not present

## 2020-02-11 DIAGNOSIS — I69398 Other sequelae of cerebral infarction: Secondary | ICD-10-CM | POA: Diagnosis not present

## 2020-02-11 DIAGNOSIS — I63312 Cerebral infarction due to thrombosis of left middle cerebral artery: Secondary | ICD-10-CM

## 2020-02-11 DIAGNOSIS — I69391 Dysphagia following cerebral infarction: Secondary | ICD-10-CM | POA: Diagnosis not present

## 2020-02-11 DIAGNOSIS — F419 Anxiety disorder, unspecified: Secondary | ICD-10-CM | POA: Diagnosis not present

## 2020-02-11 MED ORDER — ATORVASTATIN CALCIUM 80 MG PO TABS: ORAL_TABLET | ORAL | 1 refills | Status: AC

## 2020-02-11 MED ORDER — AMLODIPINE BESYLATE 10 MG PO TABS: 10.0000 mg | ORAL_TABLET | Freq: Every day | ORAL | 1 refills | Status: AC

## 2020-02-11 MED ORDER — CARVEDILOL 3.125 MG PO TABS: 3.1250 mg | ORAL_TABLET | Freq: Two times a day (BID) | ORAL | 1 refills | Status: AC

## 2020-02-11 MED ORDER — FOLIC ACID 1 MG PO TABS: 1.0000 mg | ORAL_TABLET | Freq: Every day | ORAL | 1 refills | Status: AC

## 2020-02-11 MED ORDER — LEVETIRACETAM 750 MG PO TABS: 750.0000 mg | ORAL_TABLET | Freq: Two times a day (BID) | ORAL | 3 refills | Status: DC

## 2020-02-11 NOTE — Telephone Encounter (Signed)
Medication: atorvastatin (LIPITOR) 80 MG tablet [id number]   levETIRAcetam (KEPPRA) 0000000 MG tablet  folic acid (FOLVITE) 1 MG tablet carvedilol (COREG) 3.125 MG tablet  amLODipine (NORVASC) 10 MG     Has the patient contacted their pharmacy? No. (If no, request that the patient contact the pharmacy for the refill.) (If yes, when and what did the pharmacy advise?)  Preferred Pharmacy (with phone number or street name):[facility] [phone] - [address], [place]  [place], [place] [address]  Phone:  [phone] Fax:  [phone]   Agent: Please be advised that RX refills may take up to 3 business days. We ask that you follow-up with your pharmacy.

## 2020-02-11 NOTE — Telephone Encounter (Signed)
Medications sent in.

## 2020-02-12 ENCOUNTER — Encounter: Payer: Medicare PPO | Admitting: Vascular Surgery

## 2020-02-12 DIAGNOSIS — I69391 Dysphagia following cerebral infarction: Secondary | ICD-10-CM | POA: Diagnosis not present

## 2020-02-12 DIAGNOSIS — I10 Essential (primary) hypertension: Secondary | ICD-10-CM | POA: Diagnosis not present

## 2020-02-12 DIAGNOSIS — I69398 Other sequelae of cerebral infarction: Secondary | ICD-10-CM | POA: Diagnosis not present

## 2020-02-12 DIAGNOSIS — F419 Anxiety disorder, unspecified: Secondary | ICD-10-CM | POA: Diagnosis not present

## 2020-02-12 DIAGNOSIS — I69320 Aphasia following cerebral infarction: Secondary | ICD-10-CM | POA: Diagnosis not present

## 2020-02-12 DIAGNOSIS — F329 Major depressive disorder, single episode, unspecified: Secondary | ICD-10-CM | POA: Diagnosis not present

## 2020-02-12 DIAGNOSIS — F1510 Other stimulant abuse, uncomplicated: Secondary | ICD-10-CM | POA: Diagnosis not present

## 2020-02-12 DIAGNOSIS — G40909 Epilepsy, unspecified, not intractable, without status epilepticus: Secondary | ICD-10-CM | POA: Diagnosis not present

## 2020-02-12 DIAGNOSIS — G500 Trigeminal neuralgia: Secondary | ICD-10-CM | POA: Diagnosis not present

## 2020-02-13 DIAGNOSIS — I69320 Aphasia following cerebral infarction: Secondary | ICD-10-CM | POA: Diagnosis not present

## 2020-02-13 DIAGNOSIS — F329 Major depressive disorder, single episode, unspecified: Secondary | ICD-10-CM | POA: Diagnosis not present

## 2020-02-13 DIAGNOSIS — G500 Trigeminal neuralgia: Secondary | ICD-10-CM | POA: Diagnosis not present

## 2020-02-13 DIAGNOSIS — G40909 Epilepsy, unspecified, not intractable, without status epilepticus: Secondary | ICD-10-CM | POA: Diagnosis not present

## 2020-02-13 DIAGNOSIS — I69398 Other sequelae of cerebral infarction: Secondary | ICD-10-CM | POA: Diagnosis not present

## 2020-02-13 DIAGNOSIS — I10 Essential (primary) hypertension: Secondary | ICD-10-CM | POA: Diagnosis not present

## 2020-02-13 DIAGNOSIS — I69391 Dysphagia following cerebral infarction: Secondary | ICD-10-CM | POA: Diagnosis not present

## 2020-02-13 DIAGNOSIS — F1510 Other stimulant abuse, uncomplicated: Secondary | ICD-10-CM | POA: Diagnosis not present

## 2020-02-13 DIAGNOSIS — F419 Anxiety disorder, unspecified: Secondary | ICD-10-CM | POA: Diagnosis not present

## 2020-02-14 ENCOUNTER — Ambulatory Visit (INDEPENDENT_AMBULATORY_CARE_PROVIDER_SITE_OTHER): Payer: Medicare PPO | Admitting: Vascular Surgery

## 2020-02-14 ENCOUNTER — Encounter: Payer: Self-pay | Admitting: Vascular Surgery

## 2020-02-14 ENCOUNTER — Other Ambulatory Visit: Payer: Self-pay

## 2020-02-14 VITALS — BP 127/73 | HR 69 | Temp 97.3°F | Resp 20 | Ht 69.0 in | Wt 137.0 lb

## 2020-02-14 DIAGNOSIS — I6523 Occlusion and stenosis of bilateral carotid arteries: Secondary | ICD-10-CM | POA: Diagnosis not present

## 2020-02-14 NOTE — Progress Notes (Signed)
Referring Physician: Dr Louanne Belton  Patient name: Ronald Wolf MRN: [id number] DOB: [birth year] Sex: male  REASON FOR CONSULT: Intracranial carotid stenosis HPI: Ronald Wolf is a 74 y.o. male, he admitted to the hospital after a seizure.  He was thought to potentially have had a stroke and underwent stroke work-up.  CT angios showed 80% intracranial left internal carotid artery stenosis as well as 50 to 70% right intracranial carotid artery stenosis carotid bifurcations had really no significant stenosis whatsoever.  On prior angiogram for stroke in 2018 the petrous stenosis was present on the left side at about 50%.  The right side however was not present previously in 2018.  Patient is very frail although he has returned home.  He has significant help from friends that help him with his daily activities.  He walks with a walker.  He did have a previous stroke on the left brain many years ago.  This affected his speech and right leg.  These have both recovered.  He is on Plavix and a statin.  Other medical problems include hepatitis C hypertension both of which are currently stable.  He overall has severe deconditioning.  Past Medical History:  Diagnosis Date  . Abdominal pain, other specified site 09/26/2013  . Anxiety   . Arthritis   . Atherosclerosis of arteries 05/13/2013  . Cough 09/26/2013  . Depression   . Eating disorder   . GERD (gastroesophageal reflux disease)   . Hepatitis C   . Hypertension   . Lesion of liver 04/01/2013  . Loss of weight 06/21/2014  . Medial knee pain 09/26/2013  . Palpitations 06/21/2014  . Shortness of breath dyspnea    with exertion   Past Surgical History:  Procedure Laterality Date  . IR ANGIO INTRA EXTRACRAN SEL COM CAROTID INNOMINATE BILAT MOD SED  09/09/2017  . IR ANGIO VERTEBRAL SEL VERTEBRAL BILAT MOD SED  09/09/2017  . IR RADIOLOGIST EVAL & MGMT  09/07/2017  . MICROLARYNGOSCOPY WITH CO2 LASER AND EXCISION OF VOCAL CORD LESION N/A 05/14/2016   Procedure: MICROLARYNGOSCOPY WITH CO2 LASER AND EXCISION OF VOCAL CORD LESION;  Surgeon: Melida Quitter, MD;  Location: [facility];  Service: ENT;  Laterality: N/A;  Suspended micro laryngoscopy with CO2 laser excision of vocal fold lesion  . TUMOR REMOVAL  1990   bladder    Family History  Problem Relation Age of Onset  . Diabetes Mother   . Arthritis Mother   . Healthy Sister        x 2  . Cancer Brother        stomach  . Dementia Father   . Prostate cancer Neg Hx   . Colon cancer Neg Hx   . Breast cancer Neg Hx   . Heart disease Neg Hx   . Hypertension Neg Hx     SOCIAL HISTORY: Social History   Socioeconomic History  . Marital status: Single    Spouse name: Not on file  . Number of children: 6  . Years of education: 12  . Highest education level: Not on file  Occupational History  . Not on file  Tobacco Use  . Smoking status: Current Every Day Smoker    Packs/day: 2.00    Years: 60.00    Pack years: 120.00    Types: Cigarettes  . Smokeless tobacco: Never Used  Substance and Sexual Activity  . Alcohol use: Yes    Alcohol/week: 0.0 standard drinks    Comment:  drinks brandy  daily  . Drug use: Yes    Types: Marijuana    Comment: "the legal kind"  . Sexual activity: Not on file  Other Topics Concern  . Not on file  Social History Narrative   Patient drinks caffeine occasionally.   Patient is right handed   Lives alone   Wears seat belts   Social Determinants of Health   Financial Resource Strain:   . Difficulty of Paying Living Expenses: Not on file  Food Insecurity:   . Worried About Charity fundraiser in the Last Year: Not on file  . Ran Out of Food in the Last Year: Not on file  Transportation Needs:   . Lack of Transportation (Medical): Not on file  . Lack of Transportation (Non-Medical): Not on file  Physical Activity:   . Days of Exercise per Week: Not on file  . Minutes of Exercise per Session: Not on file  Stress:   . Feeling of Stress : Not on  file  Social Connections:   . Frequency of Communication with Friends and Family: Not on file  . Frequency of Social Gatherings with Friends and Family: Not on file  . Attends Religious Services: Not on file  . Active Member of Clubs or Organizations: Not on file  . Attends Archivist Meetings: Not on file  . Marital Status: Not on file  Intimate Partner Violence:   . Fear of Current or Ex-Partner: Not on file  . Emotionally Abused: Not on file  . Physically Abused: Not on file  . Sexually Abused: Not on file    Allergies  Allergen Reactions  . Iohexol Hives     Desc: pt broke out in hives years ago from iv contrast. he was given 50mg  benadryl po, 1 hr prior to ct and did fine today w/o complications.  JB     Current Outpatient Medications  Medication Sig Dispense Refill  . amLODipine (NORVASC) 10 MG tablet Take 1 tablet (10 mg total) by mouth daily. 90 tablet 1  . atorvastatin (LIPITOR) 80 MG tablet TAKE 1 TABLET(80 MG) BY MOUTH DAILY AT 6 PM 90 tablet 1  . carvedilol (COREG) 3.125 MG tablet Take 1 tablet (3.125 mg total) by mouth 2 (two) times daily with a meal. 180 tablet 1  . clopidogrel (PLAVIX) 75 MG tablet TAKE 1 TABLET(75 MG) BY MOUTH DAILY 90 tablet 1  . folic acid (FOLVITE) 1 MG tablet Take 1 tablet (1 mg total) by mouth daily. 90 tablet 1  . levETIRAcetam (KEPPRA) 750 MG tablet Take 1 tablet (750 mg total) by mouth 2 (two) times daily. 60 tablet 3  . metoprolol tartrate (LOPRESSOR) 25 MG tablet     . tetrahydrozoline-zinc (VISINE-AC) 0.05-0.25 % ophthalmic solution Place 1 drop into both eyes daily as needed (dry eyes).      No current facility-administered medications for this visit.    ROS:   General:  No weight loss, Fever, chills  HEENT: No recent headaches, no nasal bleeding, no visual changes, no sore throat  Neurologic: No dizziness, blackouts, seizures. No recent symptoms of stroke or mini- stroke. No recent episodes of slurred speech, or  temporary blindness.  Cardiac: No recent episodes of chest pain/pressure, no shortness of breath at rest.  No shortness of breath with exertion.  Denies history of atrial fibrillation or irregular heartbeat  Vascular: No history of rest pain in feet.  No history of claudication.  No history of non-healing ulcer, No history of DVT  Pulmonary: No home oxygen, no productive cough, no hemoptysis,  No asthma or wheezing  Musculoskeletal:  [ ]  Arthritis, [ ]  Low back pain,  [ ]  Joint pain  Hematologic:No history of hypercoagulable state.  No history of easy bleeding.  No history of anemia  Gastrointestinal: No hematochezia or melena,  No gastroesophageal reflux, no trouble swallowing  Urinary: [ ]  chronic Kidney disease, [ ]  on HD - [ ]  MWF or [ ]  TTHS, [ ]  Burning with urination, [ ]  Frequent urination, [ ]  Difficulty urinating;   Skin: No rashes  Psychological: No history of anxiety,  No history of depression   Physical Examination  Vitals:   02/14/20 1528 02/14/20 1530  BP: 113/69 127/73  Pulse: 69   Resp: 20   Temp: (!) 97.3 F (36.3 C)   SpO2: 100%   Weight: 137 lb (62.1 kg)   Height: 5\' 9"  (1.753 m)     Body mass index is 20.23 kg/m.  General:  Alert and oriented, no acute distress HEENT: Normal Neck: No bruit or JVD Cardiac: Regular Rate and Rhythm  Abdomen: Soft, non-tender, non-distended, no mass Skin: No rash Extremity Pulses:  2+ radial, brachial, femoral, absent dorsalis pedis, posterior tibial pulses bilaterally Musculoskeletal: No deformity or edema  Neurologic: Upper and lower extremity motor 5/5 and symmetric  DATA:  I reviewed the images from the patient's recent CT angiogram of the head and neck.  The cyst petrous portion carotid stenosis bilaterally as mentioned per history of present illness.  There is no significant carotid bifurcation disease.  Old left brain stroke.  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  This area is inaccessible to open surgical techniques.  We do not usually do any interventions for the intracranial portion of the carotid artery and these interventions are usually performed by interventional radiology.  However, interventions of intracranial carotid artery occlusive disease are controversial at best as to whether or not they are beneficial.  I would favor medical management of this.  Especially in light of the fact that he has not currently had a stroke and they are asymptomatic.   PLAN: Patient will follow up with neurology in the next 1 to 2 weeks for further evaluation for follow-up of his intracranial artery occlusive disease as well as his recent seizure.  He will follow up with me on as-needed basis.  He will continue his Plavix and statin.   Ruta Hinds, MD [facility] Office: [phone] Pager: [phone]

## 2020-02-15 DIAGNOSIS — I10 Essential (primary) hypertension: Secondary | ICD-10-CM | POA: Diagnosis not present

## 2020-02-15 DIAGNOSIS — I69398 Other sequelae of cerebral infarction: Secondary | ICD-10-CM | POA: Diagnosis not present

## 2020-02-15 DIAGNOSIS — I69391 Dysphagia following cerebral infarction: Secondary | ICD-10-CM | POA: Diagnosis not present

## 2020-02-15 DIAGNOSIS — F1510 Other stimulant abuse, uncomplicated: Secondary | ICD-10-CM | POA: Diagnosis not present

## 2020-02-15 DIAGNOSIS — F329 Major depressive disorder, single episode, unspecified: Secondary | ICD-10-CM | POA: Diagnosis not present

## 2020-02-15 DIAGNOSIS — G40909 Epilepsy, unspecified, not intractable, without status epilepticus: Secondary | ICD-10-CM | POA: Diagnosis not present

## 2020-02-15 DIAGNOSIS — F419 Anxiety disorder, unspecified: Secondary | ICD-10-CM | POA: Diagnosis not present

## 2020-02-15 DIAGNOSIS — G500 Trigeminal neuralgia: Secondary | ICD-10-CM | POA: Diagnosis not present

## 2020-02-15 DIAGNOSIS — I69320 Aphasia following cerebral infarction: Secondary | ICD-10-CM | POA: Diagnosis not present

## 2020-02-18 ENCOUNTER — Other Ambulatory Visit: Payer: Self-pay

## 2020-02-18 DIAGNOSIS — I69398 Other sequelae of cerebral infarction: Secondary | ICD-10-CM | POA: Diagnosis not present

## 2020-02-18 DIAGNOSIS — G500 Trigeminal neuralgia: Secondary | ICD-10-CM | POA: Diagnosis not present

## 2020-02-18 DIAGNOSIS — F1510 Other stimulant abuse, uncomplicated: Secondary | ICD-10-CM | POA: Diagnosis not present

## 2020-02-18 DIAGNOSIS — I69391 Dysphagia following cerebral infarction: Secondary | ICD-10-CM | POA: Diagnosis not present

## 2020-02-18 DIAGNOSIS — I10 Essential (primary) hypertension: Secondary | ICD-10-CM | POA: Diagnosis not present

## 2020-02-18 DIAGNOSIS — I69320 Aphasia following cerebral infarction: Secondary | ICD-10-CM | POA: Diagnosis not present

## 2020-02-18 DIAGNOSIS — F419 Anxiety disorder, unspecified: Secondary | ICD-10-CM | POA: Diagnosis not present

## 2020-02-18 DIAGNOSIS — G40909 Epilepsy, unspecified, not intractable, without status epilepticus: Secondary | ICD-10-CM | POA: Diagnosis not present

## 2020-02-18 DIAGNOSIS — F329 Major depressive disorder, single episode, unspecified: Secondary | ICD-10-CM | POA: Diagnosis not present

## 2020-02-19 ENCOUNTER — Telehealth: Payer: Self-pay | Admitting: *Deleted

## 2020-02-19 ENCOUNTER — Ambulatory Visit: Payer: Medicare PPO | Admitting: Family Medicine

## 2020-02-19 NOTE — Telephone Encounter (Signed)
Sheketia left message-with explanation of time and location.

## 2020-02-19 NOTE — Telephone Encounter (Signed)
Caller Name [place] Phone Number [phone] Patient Name Ronald Wolf Patient DOB [birth year] Call Type Message Only Information Provided Reason for Call Request for General Office Information Initial Comment Caller states her father has an appt tomorrow. She needs to confirm the location and time.

## 2020-02-20 ENCOUNTER — Telehealth: Payer: Self-pay | Admitting: Family Medicine

## 2020-02-20 NOTE — Telephone Encounter (Signed)
Just FYI.

## 2020-02-20 NOTE — Telephone Encounter (Signed)
Per Minda @ [facility]  [phone]  She is pausing in home  Speech therapy for this week, Speech Therapy will resume next week.

## 2020-02-27 DIAGNOSIS — F329 Major depressive disorder, single episode, unspecified: Secondary | ICD-10-CM | POA: Diagnosis not present

## 2020-02-27 DIAGNOSIS — I69398 Other sequelae of cerebral infarction: Secondary | ICD-10-CM | POA: Diagnosis not present

## 2020-02-27 DIAGNOSIS — G40909 Epilepsy, unspecified, not intractable, without status epilepticus: Secondary | ICD-10-CM | POA: Diagnosis not present

## 2020-02-27 DIAGNOSIS — I10 Essential (primary) hypertension: Secondary | ICD-10-CM | POA: Diagnosis not present

## 2020-02-27 DIAGNOSIS — I69391 Dysphagia following cerebral infarction: Secondary | ICD-10-CM | POA: Diagnosis not present

## 2020-02-27 DIAGNOSIS — G500 Trigeminal neuralgia: Secondary | ICD-10-CM | POA: Diagnosis not present

## 2020-02-27 DIAGNOSIS — F419 Anxiety disorder, unspecified: Secondary | ICD-10-CM | POA: Diagnosis not present

## 2020-02-27 DIAGNOSIS — I69320 Aphasia following cerebral infarction: Secondary | ICD-10-CM | POA: Diagnosis not present

## 2020-02-27 DIAGNOSIS — F1510 Other stimulant abuse, uncomplicated: Secondary | ICD-10-CM | POA: Diagnosis not present

## 2020-02-29 DIAGNOSIS — G40909 Epilepsy, unspecified, not intractable, without status epilepticus: Secondary | ICD-10-CM | POA: Diagnosis not present

## 2020-02-29 DIAGNOSIS — G500 Trigeminal neuralgia: Secondary | ICD-10-CM | POA: Diagnosis not present

## 2020-02-29 DIAGNOSIS — I69391 Dysphagia following cerebral infarction: Secondary | ICD-10-CM | POA: Diagnosis not present

## 2020-02-29 DIAGNOSIS — F1510 Other stimulant abuse, uncomplicated: Secondary | ICD-10-CM | POA: Diagnosis not present

## 2020-02-29 DIAGNOSIS — I69320 Aphasia following cerebral infarction: Secondary | ICD-10-CM | POA: Diagnosis not present

## 2020-02-29 DIAGNOSIS — F329 Major depressive disorder, single episode, unspecified: Secondary | ICD-10-CM | POA: Diagnosis not present

## 2020-02-29 DIAGNOSIS — F419 Anxiety disorder, unspecified: Secondary | ICD-10-CM | POA: Diagnosis not present

## 2020-02-29 DIAGNOSIS — I69398 Other sequelae of cerebral infarction: Secondary | ICD-10-CM | POA: Diagnosis not present

## 2020-02-29 DIAGNOSIS — I10 Essential (primary) hypertension: Secondary | ICD-10-CM | POA: Diagnosis not present

## 2020-03-06 DIAGNOSIS — I69398 Other sequelae of cerebral infarction: Secondary | ICD-10-CM | POA: Diagnosis not present

## 2020-03-06 DIAGNOSIS — F1510 Other stimulant abuse, uncomplicated: Secondary | ICD-10-CM | POA: Diagnosis not present

## 2020-03-06 DIAGNOSIS — I10 Essential (primary) hypertension: Secondary | ICD-10-CM | POA: Diagnosis not present

## 2020-03-06 DIAGNOSIS — I69320 Aphasia following cerebral infarction: Secondary | ICD-10-CM | POA: Diagnosis not present

## 2020-03-06 DIAGNOSIS — G500 Trigeminal neuralgia: Secondary | ICD-10-CM | POA: Diagnosis not present

## 2020-03-06 DIAGNOSIS — I69391 Dysphagia following cerebral infarction: Secondary | ICD-10-CM | POA: Diagnosis not present

## 2020-03-06 DIAGNOSIS — F419 Anxiety disorder, unspecified: Secondary | ICD-10-CM | POA: Diagnosis not present

## 2020-03-06 DIAGNOSIS — G40909 Epilepsy, unspecified, not intractable, without status epilepticus: Secondary | ICD-10-CM | POA: Diagnosis not present

## 2020-03-06 DIAGNOSIS — F329 Major depressive disorder, single episode, unspecified: Secondary | ICD-10-CM | POA: Diagnosis not present

## 2020-03-11 DIAGNOSIS — F329 Major depressive disorder, single episode, unspecified: Secondary | ICD-10-CM | POA: Diagnosis not present

## 2020-03-11 DIAGNOSIS — F1510 Other stimulant abuse, uncomplicated: Secondary | ICD-10-CM | POA: Diagnosis not present

## 2020-03-11 DIAGNOSIS — F419 Anxiety disorder, unspecified: Secondary | ICD-10-CM | POA: Diagnosis not present

## 2020-03-11 DIAGNOSIS — G500 Trigeminal neuralgia: Secondary | ICD-10-CM | POA: Diagnosis not present

## 2020-03-11 DIAGNOSIS — I10 Essential (primary) hypertension: Secondary | ICD-10-CM | POA: Diagnosis not present

## 2020-03-11 DIAGNOSIS — I69391 Dysphagia following cerebral infarction: Secondary | ICD-10-CM | POA: Diagnosis not present

## 2020-03-11 DIAGNOSIS — I69320 Aphasia following cerebral infarction: Secondary | ICD-10-CM | POA: Diagnosis not present

## 2020-03-11 DIAGNOSIS — G40909 Epilepsy, unspecified, not intractable, without status epilepticus: Secondary | ICD-10-CM | POA: Diagnosis not present

## 2020-03-11 DIAGNOSIS — I69398 Other sequelae of cerebral infarction: Secondary | ICD-10-CM | POA: Diagnosis not present

## 2020-03-20 ENCOUNTER — Telehealth: Payer: Self-pay | Admitting: *Deleted

## 2020-03-20 ENCOUNTER — Telehealth: Payer: Self-pay | Admitting: Family Medicine

## 2020-03-20 NOTE — Telephone Encounter (Signed)
Error

## 2020-03-20 NOTE — Telephone Encounter (Signed)
[facility] called regarding fax that was sent 3/217/21 . Please call back [place].

## 2020-03-20 NOTE — Telephone Encounter (Signed)
[name] stated that there were several orders that were not sent over.  Went over orders that were in media and printed out the ones that she did not get.  Orders were refaxed.   She will resend over orders that I have not got for patient.

## 2020-04-07 ENCOUNTER — Ambulatory Visit: Payer: Medicare PPO | Admitting: Family Medicine

## 2020-04-14 ENCOUNTER — Other Ambulatory Visit: Payer: Self-pay

## 2020-04-14 ENCOUNTER — Ambulatory Visit: Payer: Medicare PPO | Admitting: Family Medicine

## 2020-04-21 ENCOUNTER — Ambulatory Visit: Payer: Medicare PPO | Admitting: Medical

## 2020-04-21 DIAGNOSIS — Z0289 Encounter for other administrative examinations: Secondary | ICD-10-CM

## 2020-06-30 ENCOUNTER — Other Ambulatory Visit: Payer: Self-pay | Admitting: Family Medicine

## 2020-08-04 ENCOUNTER — Other Ambulatory Visit: Payer: Self-pay | Admitting: Family Medicine

## 2020-08-20 ENCOUNTER — Other Ambulatory Visit: Payer: Self-pay | Admitting: Family Medicine

## 2020-08-20 DIAGNOSIS — I63312 Cerebral infarction due to thrombosis of left middle cerebral artery: Secondary | ICD-10-CM

## 2020-08-26 DIAGNOSIS — K922 Gastrointestinal hemorrhage, unspecified: Secondary | ICD-10-CM | POA: Diagnosis not present

## 2020-08-26 DIAGNOSIS — R630 Anorexia: Secondary | ICD-10-CM | POA: Diagnosis not present

## 2020-11-04 DIAGNOSIS — I1310 Hypertensive heart and chronic kidney disease without heart failure, with stage 1 through stage 4 chronic kidney disease, or unspecified chronic kidney disease: Secondary | ICD-10-CM | POA: Diagnosis not present

## 2020-11-04 DIAGNOSIS — E782 Mixed hyperlipidemia: Secondary | ICD-10-CM | POA: Diagnosis not present

## 2020-11-04 DIAGNOSIS — Z23 Encounter for immunization: Secondary | ICD-10-CM | POA: Diagnosis not present

## 2020-11-04 DIAGNOSIS — F17200 Nicotine dependence, unspecified, uncomplicated: Secondary | ICD-10-CM | POA: Diagnosis not present

## 2020-11-04 DIAGNOSIS — G40909 Epilepsy, unspecified, not intractable, without status epilepticus: Secondary | ICD-10-CM | POA: Diagnosis not present

## 2020-11-04 DIAGNOSIS — G319 Degenerative disease of nervous system, unspecified: Secondary | ICD-10-CM | POA: Diagnosis not present

## 2020-11-04 DIAGNOSIS — I6930 Unspecified sequelae of cerebral infarction: Secondary | ICD-10-CM | POA: Diagnosis not present

## 2020-11-04 DIAGNOSIS — F0150 Vascular dementia without behavioral disturbance: Secondary | ICD-10-CM | POA: Diagnosis not present

## 2020-11-04 DIAGNOSIS — F331 Major depressive disorder, recurrent, moderate: Secondary | ICD-10-CM | POA: Diagnosis not present

## 2020-11-11 DIAGNOSIS — Z20822 Contact with and (suspected) exposure to covid-19: Secondary | ICD-10-CM | POA: Diagnosis not present

## 2020-11-11 DIAGNOSIS — Z79899 Other long term (current) drug therapy: Secondary | ICD-10-CM | POA: Diagnosis not present

## 2020-11-11 DIAGNOSIS — R112 Nausea with vomiting, unspecified: Secondary | ICD-10-CM | POA: Diagnosis not present

## 2020-11-11 DIAGNOSIS — I10 Essential (primary) hypertension: Secondary | ICD-10-CM | POA: Diagnosis not present

## 2020-11-11 DIAGNOSIS — R7989 Other specified abnormal findings of blood chemistry: Secondary | ICD-10-CM | POA: Diagnosis not present

## 2020-11-11 DIAGNOSIS — K769 Liver disease, unspecified: Secondary | ICD-10-CM | POA: Diagnosis not present

## 2020-11-11 DIAGNOSIS — R778 Other specified abnormalities of plasma proteins: Secondary | ICD-10-CM | POA: Diagnosis not present

## 2020-11-11 DIAGNOSIS — R1084 Generalized abdominal pain: Secondary | ICD-10-CM | POA: Diagnosis not present

## 2020-11-11 DIAGNOSIS — F0390 Unspecified dementia without behavioral disturbance: Secondary | ICD-10-CM | POA: Diagnosis not present

## 2020-11-11 DIAGNOSIS — F17200 Nicotine dependence, unspecified, uncomplicated: Secondary | ICD-10-CM | POA: Diagnosis not present

## 2020-11-11 DIAGNOSIS — K56609 Unspecified intestinal obstruction, unspecified as to partial versus complete obstruction: Secondary | ICD-10-CM | POA: Diagnosis not present

## 2020-11-12 ENCOUNTER — Inpatient Hospital Stay (HOSPITAL_COMMUNITY): Payer: Medicare PPO

## 2020-11-12 ENCOUNTER — Encounter (HOSPITAL_COMMUNITY): Payer: Self-pay | Admitting: Emergency Medicine

## 2020-11-12 ENCOUNTER — Inpatient Hospital Stay (HOSPITAL_COMMUNITY)
Admission: EM | Admit: 2020-11-12 | Discharge: 2021-02-10 | DRG: 003 | Disposition: E | Payer: Medicare PPO | Attending: Family Medicine | Admitting: Family Medicine

## 2020-11-12 ENCOUNTER — Other Ambulatory Visit: Payer: Self-pay

## 2020-11-12 DIAGNOSIS — K9423 Gastrostomy malfunction: Secondary | ICD-10-CM | POA: Diagnosis not present

## 2020-11-12 DIAGNOSIS — F0390 Unspecified dementia without behavioral disturbance: Secondary | ICD-10-CM | POA: Diagnosis not present

## 2020-11-12 DIAGNOSIS — R000 Tachycardia, unspecified: Secondary | ICD-10-CM | POA: Diagnosis not present

## 2020-11-12 DIAGNOSIS — J9612 Chronic respiratory failure with hypercapnia: Secondary | ICD-10-CM | POA: Diagnosis not present

## 2020-11-12 DIAGNOSIS — F418 Other specified anxiety disorders: Secondary | ICD-10-CM | POA: Diagnosis not present

## 2020-11-12 DIAGNOSIS — R579 Shock, unspecified: Secondary | ICD-10-CM

## 2020-11-12 DIAGNOSIS — F419 Anxiety disorder, unspecified: Secondary | ICD-10-CM | POA: Diagnosis present

## 2020-11-12 DIAGNOSIS — Z79899 Other long term (current) drug therapy: Secondary | ICD-10-CM

## 2020-11-12 DIAGNOSIS — K9189 Other postprocedural complications and disorders of digestive system: Secondary | ICD-10-CM | POA: Diagnosis not present

## 2020-11-12 DIAGNOSIS — J9600 Acute respiratory failure, unspecified whether with hypoxia or hypercapnia: Secondary | ICD-10-CM | POA: Diagnosis not present

## 2020-11-12 DIAGNOSIS — J9610 Chronic respiratory failure, unspecified whether with hypoxia or hypercapnia: Secondary | ICD-10-CM

## 2020-11-12 DIAGNOSIS — I10 Essential (primary) hypertension: Secondary | ICD-10-CM | POA: Diagnosis not present

## 2020-11-12 DIAGNOSIS — Z9049 Acquired absence of other specified parts of digestive tract: Secondary | ICD-10-CM | POA: Diagnosis not present

## 2020-11-12 DIAGNOSIS — E872 Acidosis, unspecified: Secondary | ICD-10-CM | POA: Diagnosis present

## 2020-11-12 DIAGNOSIS — Z8673 Personal history of transient ischemic attack (TIA), and cerebral infarction without residual deficits: Secondary | ICD-10-CM | POA: Diagnosis not present

## 2020-11-12 DIAGNOSIS — I9589 Other hypotension: Secondary | ICD-10-CM | POA: Diagnosis not present

## 2020-11-12 DIAGNOSIS — N182 Chronic kidney disease, stage 2 (mild): Secondary | ICD-10-CM | POA: Diagnosis present

## 2020-11-12 DIAGNOSIS — R5381 Other malaise: Secondary | ICD-10-CM | POA: Diagnosis not present

## 2020-11-12 DIAGNOSIS — K5651 Intestinal adhesions [bands], with partial obstruction: Secondary | ICD-10-CM | POA: Diagnosis not present

## 2020-11-12 DIAGNOSIS — K3189 Other diseases of stomach and duodenum: Secondary | ICD-10-CM | POA: Diagnosis not present

## 2020-11-12 DIAGNOSIS — Z681 Body mass index (BMI) 19 or less, adult: Secondary | ICD-10-CM | POA: Diagnosis not present

## 2020-11-12 DIAGNOSIS — F17200 Nicotine dependence, unspecified, uncomplicated: Secondary | ICD-10-CM | POA: Diagnosis not present

## 2020-11-12 DIAGNOSIS — R069 Unspecified abnormalities of breathing: Secondary | ICD-10-CM

## 2020-11-12 DIAGNOSIS — E860 Dehydration: Secondary | ICD-10-CM | POA: Diagnosis present

## 2020-11-12 DIAGNOSIS — R627 Adult failure to thrive: Secondary | ICD-10-CM | POA: Diagnosis not present

## 2020-11-12 DIAGNOSIS — L89619 Pressure ulcer of right heel, unspecified stage: Secondary | ICD-10-CM | POA: Diagnosis present

## 2020-11-12 DIAGNOSIS — J9811 Atelectasis: Secondary | ICD-10-CM | POA: Diagnosis not present

## 2020-11-12 DIAGNOSIS — Y848 Other medical procedures as the cause of abnormal reaction of the patient, or of later complication, without mention of misadventure at the time of the procedure: Secondary | ICD-10-CM | POA: Diagnosis not present

## 2020-11-12 DIAGNOSIS — Z9889 Other specified postprocedural states: Secondary | ICD-10-CM | POA: Diagnosis not present

## 2020-11-12 DIAGNOSIS — I469 Cardiac arrest, cause unspecified: Secondary | ICD-10-CM | POA: Diagnosis not present

## 2020-11-12 DIAGNOSIS — E870 Hyperosmolality and hypernatremia: Secondary | ICD-10-CM | POA: Diagnosis present

## 2020-11-12 DIAGNOSIS — K219 Gastro-esophageal reflux disease without esophagitis: Secondary | ICD-10-CM | POA: Diagnosis present

## 2020-11-12 DIAGNOSIS — E875 Hyperkalemia: Secondary | ICD-10-CM | POA: Diagnosis not present

## 2020-11-12 DIAGNOSIS — K559 Vascular disorder of intestine, unspecified: Secondary | ICD-10-CM | POA: Diagnosis not present

## 2020-11-12 DIAGNOSIS — Z20822 Contact with and (suspected) exposure to covid-19: Secondary | ICD-10-CM | POA: Diagnosis not present

## 2020-11-12 DIAGNOSIS — R52 Pain, unspecified: Secondary | ICD-10-CM | POA: Diagnosis not present

## 2020-11-12 DIAGNOSIS — G9340 Encephalopathy, unspecified: Secondary | ICD-10-CM | POA: Diagnosis not present

## 2020-11-12 DIAGNOSIS — N4889 Other specified disorders of penis: Secondary | ICD-10-CM | POA: Diagnosis not present

## 2020-11-12 DIAGNOSIS — G40909 Epilepsy, unspecified, not intractable, without status epilepticus: Secondary | ICD-10-CM | POA: Diagnosis present

## 2020-11-12 DIAGNOSIS — T8143XA Infection following a procedure, organ and space surgical site, initial encounter: Secondary | ICD-10-CM | POA: Diagnosis not present

## 2020-11-12 DIAGNOSIS — R109 Unspecified abdominal pain: Secondary | ICD-10-CM | POA: Diagnosis not present

## 2020-11-12 DIAGNOSIS — J9501 Hemorrhage from tracheostomy stoma: Secondary | ICD-10-CM | POA: Diagnosis not present

## 2020-11-12 DIAGNOSIS — Z8659 Personal history of other mental and behavioral disorders: Secondary | ICD-10-CM

## 2020-11-12 DIAGNOSIS — R4182 Altered mental status, unspecified: Secondary | ICD-10-CM | POA: Diagnosis not present

## 2020-11-12 DIAGNOSIS — Z8551 Personal history of malignant neoplasm of bladder: Secondary | ICD-10-CM

## 2020-11-12 DIAGNOSIS — R8281 Pyuria: Secondary | ICD-10-CM | POA: Diagnosis present

## 2020-11-12 DIAGNOSIS — K6389 Other specified diseases of intestine: Secondary | ICD-10-CM | POA: Diagnosis not present

## 2020-11-12 DIAGNOSIS — L89152 Pressure ulcer of sacral region, stage 2: Secondary | ICD-10-CM | POA: Diagnosis not present

## 2020-11-12 DIAGNOSIS — I4460 Unspecified fascicular block: Secondary | ICD-10-CM | POA: Diagnosis not present

## 2020-11-12 DIAGNOSIS — Z7189 Other specified counseling: Secondary | ICD-10-CM

## 2020-11-12 DIAGNOSIS — Z932 Ileostomy status: Secondary | ICD-10-CM | POA: Diagnosis not present

## 2020-11-12 DIAGNOSIS — E43 Unspecified severe protein-calorie malnutrition: Secondary | ICD-10-CM | POA: Insufficient documentation

## 2020-11-12 DIAGNOSIS — R112 Nausea with vomiting, unspecified: Secondary | ICD-10-CM | POA: Diagnosis not present

## 2020-11-12 DIAGNOSIS — Z933 Colostomy status: Secondary | ICD-10-CM

## 2020-11-12 DIAGNOSIS — R1084 Generalized abdominal pain: Secondary | ICD-10-CM | POA: Diagnosis not present

## 2020-11-12 DIAGNOSIS — E785 Hyperlipidemia, unspecified: Secondary | ICD-10-CM | POA: Diagnosis present

## 2020-11-12 DIAGNOSIS — J189 Pneumonia, unspecified organism: Secondary | ICD-10-CM | POA: Diagnosis not present

## 2020-11-12 DIAGNOSIS — K55029 Acute infarction of small intestine, extent unspecified: Secondary | ICD-10-CM | POA: Diagnosis not present

## 2020-11-12 DIAGNOSIS — Z9911 Dependence on respirator [ventilator] status: Secondary | ICD-10-CM | POA: Diagnosis not present

## 2020-11-12 DIAGNOSIS — I4891 Unspecified atrial fibrillation: Secondary | ICD-10-CM | POA: Diagnosis present

## 2020-11-12 DIAGNOSIS — T8189XA Other complications of procedures, not elsewhere classified, initial encounter: Secondary | ICD-10-CM | POA: Diagnosis not present

## 2020-11-12 DIAGNOSIS — M1990 Unspecified osteoarthritis, unspecified site: Secondary | ICD-10-CM | POA: Diagnosis present

## 2020-11-12 DIAGNOSIS — Z978 Presence of other specified devices: Secondary | ICD-10-CM

## 2020-11-12 DIAGNOSIS — E871 Hypo-osmolality and hyponatremia: Secondary | ICD-10-CM | POA: Diagnosis not present

## 2020-11-12 DIAGNOSIS — Z0189 Encounter for other specified special examinations: Secondary | ICD-10-CM

## 2020-11-12 DIAGNOSIS — R198 Other specified symptoms and signs involving the digestive system and abdomen: Secondary | ICD-10-CM | POA: Diagnosis not present

## 2020-11-12 DIAGNOSIS — Z66 Do not resuscitate: Secondary | ICD-10-CM | POA: Diagnosis not present

## 2020-11-12 DIAGNOSIS — E861 Hypovolemia: Secondary | ICD-10-CM | POA: Diagnosis present

## 2020-11-12 DIAGNOSIS — Z8261 Family history of arthritis: Secondary | ICD-10-CM

## 2020-11-12 DIAGNOSIS — R0602 Shortness of breath: Secondary | ICD-10-CM | POA: Diagnosis not present

## 2020-11-12 DIAGNOSIS — K631 Perforation of intestine (nontraumatic): Secondary | ICD-10-CM | POA: Diagnosis present

## 2020-11-12 DIAGNOSIS — L8990 Pressure ulcer of unspecified site, unspecified stage: Secondary | ICD-10-CM | POA: Insufficient documentation

## 2020-11-12 DIAGNOSIS — I472 Ventricular tachycardia: Secondary | ICD-10-CM | POA: Diagnosis not present

## 2020-11-12 DIAGNOSIS — Z888 Allergy status to other drugs, medicaments and biological substances status: Secondary | ICD-10-CM

## 2020-11-12 DIAGNOSIS — Z7902 Long term (current) use of antithrombotics/antiplatelets: Secondary | ICD-10-CM

## 2020-11-12 DIAGNOSIS — M160 Bilateral primary osteoarthritis of hip: Secondary | ICD-10-CM | POA: Diagnosis not present

## 2020-11-12 DIAGNOSIS — Z01818 Encounter for other preprocedural examination: Secondary | ICD-10-CM

## 2020-11-12 DIAGNOSIS — Z4682 Encounter for fitting and adjustment of non-vascular catheter: Secondary | ICD-10-CM | POA: Diagnosis not present

## 2020-11-12 DIAGNOSIS — Z4659 Encounter for fitting and adjustment of other gastrointestinal appliance and device: Secondary | ICD-10-CM | POA: Diagnosis not present

## 2020-11-12 DIAGNOSIS — K565 Intestinal adhesions [bands], unspecified as to partial versus complete obstruction: Secondary | ICD-10-CM | POA: Diagnosis not present

## 2020-11-12 DIAGNOSIS — R188 Other ascites: Secondary | ICD-10-CM | POA: Diagnosis present

## 2020-11-12 DIAGNOSIS — I471 Supraventricular tachycardia: Secondary | ICD-10-CM | POA: Diagnosis not present

## 2020-11-12 DIAGNOSIS — R100 Acute abdomen: Secondary | ICD-10-CM

## 2020-11-12 DIAGNOSIS — K55019 Acute (reversible) ischemia of small intestine, extent unspecified: Secondary | ICD-10-CM | POA: Diagnosis not present

## 2020-11-12 DIAGNOSIS — Z9119 Patient's noncompliance with other medical treatment and regimen: Secondary | ICD-10-CM

## 2020-11-12 DIAGNOSIS — L89892 Pressure ulcer of other site, stage 2: Secondary | ICD-10-CM | POA: Diagnosis not present

## 2020-11-12 DIAGNOSIS — J811 Chronic pulmonary edema: Secondary | ICD-10-CM | POA: Diagnosis not present

## 2020-11-12 DIAGNOSIS — R0600 Dyspnea, unspecified: Secondary | ICD-10-CM | POA: Diagnosis not present

## 2020-11-12 DIAGNOSIS — K56699 Other intestinal obstruction unspecified as to partial versus complete obstruction: Secondary | ICD-10-CM | POA: Diagnosis not present

## 2020-11-12 DIAGNOSIS — Z833 Family history of diabetes mellitus: Secondary | ICD-10-CM

## 2020-11-12 DIAGNOSIS — D6489 Other specified anemias: Secondary | ICD-10-CM | POA: Diagnosis present

## 2020-11-12 DIAGNOSIS — J181 Lobar pneumonia, unspecified organism: Secondary | ICD-10-CM | POA: Diagnosis not present

## 2020-11-12 DIAGNOSIS — K5669 Other partial intestinal obstruction: Secondary | ICD-10-CM | POA: Diagnosis not present

## 2020-11-12 DIAGNOSIS — K7689 Other specified diseases of liver: Secondary | ICD-10-CM | POA: Diagnosis not present

## 2020-11-12 DIAGNOSIS — R569 Unspecified convulsions: Secondary | ICD-10-CM

## 2020-11-12 DIAGNOSIS — F17210 Nicotine dependence, cigarettes, uncomplicated: Secondary | ICD-10-CM | POA: Diagnosis present

## 2020-11-12 DIAGNOSIS — I700 Atherosclerosis of aorta: Secondary | ICD-10-CM | POA: Diagnosis present

## 2020-11-12 DIAGNOSIS — K660 Peritoneal adhesions (postprocedural) (postinfection): Secondary | ICD-10-CM | POA: Diagnosis not present

## 2020-11-12 DIAGNOSIS — K56609 Unspecified intestinal obstruction, unspecified as to partial versus complete obstruction: Principal | ICD-10-CM

## 2020-11-12 DIAGNOSIS — K567 Ileus, unspecified: Secondary | ICD-10-CM

## 2020-11-12 DIAGNOSIS — R140 Abdominal distension (gaseous): Secondary | ICD-10-CM

## 2020-11-12 DIAGNOSIS — J95851 Ventilator associated pneumonia: Secondary | ICD-10-CM | POA: Diagnosis not present

## 2020-11-12 DIAGNOSIS — R633 Feeding difficulties, unspecified: Secondary | ICD-10-CM | POA: Diagnosis not present

## 2020-11-12 DIAGNOSIS — Z930 Tracheostomy status: Secondary | ICD-10-CM

## 2020-11-12 DIAGNOSIS — Z931 Gastrostomy status: Secondary | ICD-10-CM | POA: Diagnosis not present

## 2020-11-12 DIAGNOSIS — Z7401 Bed confinement status: Secondary | ICD-10-CM

## 2020-11-12 DIAGNOSIS — R54 Age-related physical debility: Secondary | ICD-10-CM | POA: Diagnosis present

## 2020-11-12 DIAGNOSIS — R4189 Other symptoms and signs involving cognitive functions and awareness: Secondary | ICD-10-CM | POA: Diagnosis not present

## 2020-11-12 DIAGNOSIS — R64 Cachexia: Secondary | ICD-10-CM | POA: Diagnosis present

## 2020-11-12 DIAGNOSIS — J80 Acute respiratory distress syndrome: Secondary | ICD-10-CM | POA: Diagnosis not present

## 2020-11-12 DIAGNOSIS — Z818 Family history of other mental and behavioral disorders: Secondary | ICD-10-CM

## 2020-11-12 DIAGNOSIS — Y95 Nosocomial condition: Secondary | ICD-10-CM | POA: Diagnosis not present

## 2020-11-12 DIAGNOSIS — M726 Necrotizing fasciitis: Secondary | ICD-10-CM | POA: Diagnosis not present

## 2020-11-12 DIAGNOSIS — J9601 Acute respiratory failure with hypoxia: Secondary | ICD-10-CM | POA: Diagnosis not present

## 2020-11-12 DIAGNOSIS — J9611 Chronic respiratory failure with hypoxia: Secondary | ICD-10-CM | POA: Diagnosis not present

## 2020-11-12 DIAGNOSIS — I739 Peripheral vascular disease, unspecified: Secondary | ICD-10-CM | POA: Diagnosis not present

## 2020-11-12 DIAGNOSIS — L89629 Pressure ulcer of left heel, unspecified stage: Secondary | ICD-10-CM | POA: Diagnosis present

## 2020-11-12 DIAGNOSIS — J9690 Respiratory failure, unspecified, unspecified whether with hypoxia or hypercapnia: Secondary | ICD-10-CM | POA: Diagnosis not present

## 2020-11-12 DIAGNOSIS — K912 Postsurgical malabsorption, not elsewhere classified: Secondary | ICD-10-CM | POA: Diagnosis present

## 2020-11-12 DIAGNOSIS — N179 Acute kidney failure, unspecified: Secondary | ICD-10-CM | POA: Diagnosis present

## 2020-11-12 DIAGNOSIS — Z906 Acquired absence of other parts of urinary tract: Secondary | ICD-10-CM

## 2020-11-12 DIAGNOSIS — R1909 Other intra-abdominal and pelvic swelling, mass and lump: Secondary | ICD-10-CM | POA: Diagnosis not present

## 2020-11-12 DIAGNOSIS — F0150 Vascular dementia without behavioral disturbance: Secondary | ICD-10-CM | POA: Diagnosis present

## 2020-11-12 DIAGNOSIS — Z800 Family history of malignant neoplasm of digestive organs: Secondary | ICD-10-CM

## 2020-11-12 DIAGNOSIS — R0902 Hypoxemia: Secondary | ICD-10-CM | POA: Diagnosis not present

## 2020-11-12 DIAGNOSIS — C320 Malignant neoplasm of glottis: Secondary | ICD-10-CM | POA: Diagnosis not present

## 2020-11-12 DIAGNOSIS — Z431 Encounter for attention to gastrostomy: Secondary | ICD-10-CM

## 2020-11-12 DIAGNOSIS — I1310 Hypertensive heart and chronic kidney disease without heart failure, with stage 1 through stage 4 chronic kidney disease, or unspecified chronic kidney disease: Secondary | ICD-10-CM | POA: Diagnosis present

## 2020-11-12 DIAGNOSIS — R29810 Facial weakness: Secondary | ICD-10-CM | POA: Diagnosis not present

## 2020-11-12 DIAGNOSIS — D631 Anemia in chronic kidney disease: Secondary | ICD-10-CM | POA: Diagnosis present

## 2020-11-12 DIAGNOSIS — S31109A Unspecified open wound of abdominal wall, unspecified quadrant without penetration into peritoneal cavity, initial encounter: Secondary | ICD-10-CM | POA: Diagnosis not present

## 2020-11-12 DIAGNOSIS — F32A Depression, unspecified: Secondary | ICD-10-CM | POA: Diagnosis present

## 2020-11-12 DIAGNOSIS — Z4589 Encounter for adjustment and management of other implanted devices: Secondary | ICD-10-CM | POA: Diagnosis not present

## 2020-11-12 DIAGNOSIS — K651 Peritoneal abscess: Secondary | ICD-10-CM

## 2020-11-12 DIAGNOSIS — Z515 Encounter for palliative care: Secondary | ICD-10-CM

## 2020-11-12 DIAGNOSIS — E876 Hypokalemia: Secondary | ICD-10-CM | POA: Diagnosis not present

## 2020-11-12 DIAGNOSIS — J9621 Acute and chronic respiratory failure with hypoxia: Secondary | ICD-10-CM | POA: Diagnosis not present

## 2020-11-12 DIAGNOSIS — R918 Other nonspecific abnormal finding of lung field: Secondary | ICD-10-CM | POA: Diagnosis not present

## 2020-11-12 DIAGNOSIS — B182 Chronic viral hepatitis C: Secondary | ICD-10-CM | POA: Diagnosis present

## 2020-11-12 DIAGNOSIS — K650 Generalized (acute) peritonitis: Secondary | ICD-10-CM | POA: Diagnosis not present

## 2020-11-12 DIAGNOSIS — I495 Sick sinus syndrome: Secondary | ICD-10-CM | POA: Diagnosis present

## 2020-11-12 DIAGNOSIS — R7989 Other specified abnormal findings of blood chemistry: Secondary | ICD-10-CM | POA: Diagnosis not present

## 2020-11-12 DIAGNOSIS — B952 Enterococcus as the cause of diseases classified elsewhere: Secondary | ICD-10-CM | POA: Diagnosis not present

## 2020-11-12 DIAGNOSIS — I9581 Postprocedural hypotension: Secondary | ICD-10-CM | POA: Diagnosis not present

## 2020-11-12 DIAGNOSIS — E878 Other disorders of electrolyte and fluid balance, not elsewhere classified: Secondary | ICD-10-CM | POA: Diagnosis not present

## 2020-11-12 DIAGNOSIS — K6811 Postprocedural retroperitoneal abscess: Secondary | ICD-10-CM | POA: Diagnosis not present

## 2020-11-12 DIAGNOSIS — J90 Pleural effusion, not elsewhere classified: Secondary | ICD-10-CM | POA: Diagnosis not present

## 2020-11-12 DIAGNOSIS — Z452 Encounter for adjustment and management of vascular access device: Secondary | ICD-10-CM | POA: Diagnosis not present

## 2020-11-12 DIAGNOSIS — R29898 Other symptoms and signs involving the musculoskeletal system: Secondary | ICD-10-CM | POA: Diagnosis not present

## 2020-11-12 LAB — COMPREHENSIVE METABOLIC PANEL
ALT: 18 U/L (ref 0–44)
AST: 22 U/L (ref 15–41)
Albumin: 3.4 g/dL — ABNORMAL LOW (ref 3.5–5.0)
Alkaline Phosphatase: 73 U/L (ref 38–126)
Anion gap: 20 — ABNORMAL HIGH (ref 5–15)
BUN: 51 mg/dL — ABNORMAL HIGH (ref 8–23)
CO2: 19 mmol/L — ABNORMAL LOW (ref 22–32)
Calcium: 9 mg/dL (ref 8.9–10.3)
Chloride: 111 mmol/L (ref 98–111)
Creatinine, Ser: 2.46 mg/dL — ABNORMAL HIGH (ref 0.61–1.24)
GFR, Estimated: 27 mL/min — ABNORMAL LOW (ref >60)
Glucose, Bld: 136 mg/dL — ABNORMAL HIGH (ref 70–99)
Potassium: 4.2 mmol/L (ref 3.5–5.1)
Sodium: 150 mmol/L — ABNORMAL HIGH (ref 135–145)
Total Bilirubin: 0.8 mg/dL (ref 0.3–1.2)
Total Protein: 7.1 g/dL (ref 6.5–8.1)

## 2020-11-12 LAB — CBC WITH DIFFERENTIAL/PLATELET
Abs Immature Granulocytes: 0 10*3/uL (ref 0.00–0.07)
Basophils Absolute: 0 10*3/uL (ref 0.0–0.1)
Basophils Relative: 0 %
Eosinophils Absolute: 0 10*3/uL (ref 0.0–0.5)
Eosinophils Relative: 0 %
HCT: 46.1 % (ref 39.0–52.0)
Hemoglobin: 14.6 g/dL (ref 13.0–17.0)
Lymphocytes Relative: 14 %
Lymphs Abs: 1.3 10*3/uL (ref 0.7–4.0)
MCH: 34.4 pg — ABNORMAL HIGH (ref 26.0–34.0)
MCHC: 31.7 g/dL (ref 30.0–36.0)
MCV: 108.5 fL — ABNORMAL HIGH (ref 80.0–100.0)
Monocytes Absolute: 0.8 10*3/uL (ref 0.1–1.0)
Monocytes Relative: 9 %
Neutro Abs: 7.2 10*3/uL (ref 1.7–7.7)
Neutrophils Relative %: 77 %
Platelets: 389 10*3/uL (ref 150–400)
RBC: 4.25 MIL/uL (ref 4.22–5.81)
RDW: 12.7 % (ref 11.5–15.5)
WBC: 9.4 10*3/uL (ref 4.0–10.5)
nRBC: 0 % (ref 0.0–0.2)
nRBC: 0 /100 WBC

## 2020-11-12 LAB — URINALYSIS, ROUTINE W REFLEX MICROSCOPIC
Bilirubin Urine: NEGATIVE
Glucose, UA: NEGATIVE mg/dL
Hgb urine dipstick: NEGATIVE
Ketones, ur: NEGATIVE mg/dL
Leukocytes,Ua: NEGATIVE
Nitrite: NEGATIVE
Protein, ur: 100 mg/dL — AB
Specific Gravity, Urine: 1.013 (ref 1.005–1.030)
pH: 7 (ref 5.0–8.0)

## 2020-11-12 LAB — RESP PANEL BY RT-PCR (FLU A&B, COVID) ARPGX2
Influenza A by PCR: NEGATIVE
Influenza B by PCR: NEGATIVE
SARS Coronavirus 2 by RT PCR: NEGATIVE

## 2020-11-12 LAB — LACTIC ACID, PLASMA: Lactic Acid, Venous: 4.4 mmol/L (ref 0.5–1.9)

## 2020-11-12 LAB — LIPASE, BLOOD: Lipase: 57 U/L — ABNORMAL HIGH (ref 11–51)

## 2020-11-12 MED ORDER — SODIUM CHLORIDE 0.9 % IV BOLUS
1000.0000 mL | Freq: Once | INTRAVENOUS | Status: AC
  Administered 2020-11-12: 1000 mL via INTRAVENOUS

## 2020-11-12 MED ORDER — HEPARIN SODIUM (PORCINE) 5000 UNIT/ML IJ SOLN
5000.0000 [IU] | Freq: Three times a day (TID) | INTRAMUSCULAR | Status: DC
  Administered 2020-11-12 – 2020-11-13 (×2): 5000 [IU] via SUBCUTANEOUS
  Filled 2020-11-12 (×2): qty 1

## 2020-11-12 MED ORDER — LACTATED RINGERS IV SOLN: INTRAVENOUS | Status: DC

## 2020-11-12 MED ORDER — METOPROLOL TARTRATE 5 MG/5ML IV SOLN
2.5000 mg | Freq: Three times a day (TID) | INTRAVENOUS | Status: DC
  Administered 2020-11-12: 2.5 mg via INTRAVENOUS
  Filled 2020-11-12: qty 5

## 2020-11-12 MED ORDER — DIATRIZOATE MEGLUMINE & SODIUM 66-10 % PO SOLN
90.0000 mL | Freq: Once | ORAL | Status: DC
  Filled 2020-11-12: qty 90

## 2020-11-12 MED ORDER — METOPROLOL TARTRATE 5 MG/5ML IV SOLN
5.0000 mg | Freq: Three times a day (TID) | INTRAVENOUS | Status: DC
  Administered 2020-11-13 – 2020-11-17 (×14): 5 mg via INTRAVENOUS
  Filled 2020-11-12 (×14): qty 5

## 2020-11-12 MED ORDER — LEVETIRACETAM IN NACL 500 MG/100ML IV SOLN
500.0000 mg | Freq: Two times a day (BID) | INTRAVENOUS | Status: DC
  Administered 2020-11-12: 500 mg via INTRAVENOUS
  Filled 2020-11-12 (×2): qty 100

## 2020-11-12 NOTE — ED Provider Notes (Addendum)
[facility] EMERGENCY DEPARTMENT Provider Note   CSN: [id number] Arrival date & time: 11/25/2020  1732     History No chief complaint on file.   Ronald Wolf is a 74 y.o. male.  HPI 74 year old male with a history of hepatitis C, GERD, hypertension arthritis presents to the ER with a known SBO. Patient and his daughter at bedside states that he was in [place] with his girlfriend over the last few days. He started to have some abdominal pain and some nausea and vomiting. He had a CT scan done there which was concerning for small bowel obstruction. Plan was to be admitted, he did get an NG tube there, however his family members did not want him at [facility] as they were not comfortable with the medical care there. He had signed out AMA. He presents here today with a similar complaint. Continues to have abdominal pain. Continue to have nausea and vomiting. Denies any fevers or chills. Last bowel movement was Saturday     Past Medical History:  Diagnosis Date  . Abdominal pain, other specified site 09/26/2013  . Anxiety   . Arthritis   . Atherosclerosis of arteries 05/13/2013  . Cough 09/26/2013  . Depression   . Eating disorder   . GERD (gastroesophageal reflux disease)   . Hepatitis C   . Hypertension   . Lesion of liver 04/01/2013  . Loss of weight 06/21/2014  . Medial knee pain 09/26/2013  . Palpitations 06/21/2014  . Shortness of breath dyspnea    with exertion    Patient Active Problem List   Diagnosis Date Noted  . SBO (small bowel obstruction) ([place]) 11/23/2020  . Acute metabolic encephalopathy 26/33/3545  . Seizure ([place]) 12/16/2019  . Polysubstance abuse ([place]) 12/16/2019  . History of stroke 12/16/2019  . Neurocognitive deficits 12/26/2018  . CVA (cerebral vascular accident) ([place]) 12/17/2018  . 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 Stroke ([place]) 12/20/2017  .  Decreased visual acuity 06/18/2015  . History of bladder cancer 04/24/2015  . History of UTI 03/27/2015  . UTI (urinary tract infection) 11/21/2014  . Fatigue 10/23/2014  . Hyperglycemia 10/23/2014  . Wellness examination 10/23/2014  . PVD (peripheral vascular disease) ([place]) 07/28/2014  . Anxiety and depression 07/28/2014  . Palpitations 06/21/2014  . Cough 09/26/2013  . Atherosclerosis of arteries 05/13/2013  . Smoking 05/03/2013  . Lesion of liver 04/01/2013  . Rhinitis 10/05/2012  . Chronic scapular pain 10/05/2012  . Anemia 09/17/2012  . Bone lesion 09/17/2012  . HTN (hypertension) 06/12/2012  . Trigeminal neuralgia 05/28/2012    Past Surgical History:  Procedure Laterality Date  . IR ANGIO INTRA EXTRACRAN SEL COM CAROTID INNOMINATE BILAT MOD SED  09/09/2017  . IR ANGIO VERTEBRAL SEL VERTEBRAL BILAT MOD SED  09/09/2017  . IR RADIOLOGIST EVAL & MGMT  09/07/2017  . MICROLARYNGOSCOPY WITH CO2 LASER AND EXCISION OF VOCAL CORD LESION N/A 05/14/2016   Procedure: MICROLARYNGOSCOPY WITH CO2 LASER AND EXCISION OF VOCAL CORD LESION;  Surgeon: Melida Quitter, MD;  Location: [facility];  Service: ENT;  Laterality: N/A;  Suspended micro laryngoscopy with CO2 laser excision of vocal fold lesion  . TUMOR REMOVAL  1990   bladder       Family History  Problem Relation Age of Onset  . Diabetes Mother   . Arthritis Mother   . Healthy Sister        x 2  .  Cancer Brother        stomach  . Dementia Father   . Prostate cancer Neg Hx   . Colon cancer Neg Hx   . Breast cancer Neg Hx   . Heart disease Neg Hx   . Hypertension Neg Hx     Social History   Tobacco Use  . Smoking status: Current Every Day Smoker    Packs/day: 2.00    Years: 60.00    Pack years: 120.00    Types: Cigarettes  . Smokeless tobacco: Never Used  Vaping Use  . Vaping Use: Never used  Substance Use Topics  . Alcohol use: Yes    Alcohol/week: 0.0 standard drinks    Comment:  drinks brandy daily  . Drug use: Yes     Types: Marijuana    Comment: "the legal kind"    Home Medications Prior to Admission medications   Medication Sig Start Date End Date Taking? Authorizing Provider  amLODipine (NORVASC) 10 MG tablet Take 1 tablet (10 mg total) by mouth daily. 02/11/20  Yes Mosie Lukes, MD  atorvastatin (LIPITOR) 80 MG tablet TAKE 1 TABLET(80 MG) BY MOUTH DAILY AT 6 PM 02/11/20  Yes Mosie Lukes, MD  carvedilol (COREG) 3.125 MG tablet Take 1 tablet (3.125 mg total) by mouth 2 (two) times daily with a meal. 02/11/20  Yes Mosie Lukes, MD  clopidogrel (PLAVIX) 75 MG tablet TAKE 1 TABLET(75 MG) BY MOUTH DAILY Patient taking differently: Take 75 mg by mouth daily. TAKE 1 TABLET(75 MG) BY MOUTH DAILY 08/21/20  Yes Mosie Lukes, MD  folic acid (FOLVITE) 1 MG tablet Take 1 tablet (1 mg total) by mouth daily. 02/11/20  Yes Mosie Lukes, MD  levETIRAcetam (KEPPRA) 750 MG tablet TAKE 1 TABLET(750 MG) BY MOUTH TWICE DAILY Patient taking differently: Take 750 mg by mouth 2 (two) times daily.  06/30/20   Mosie Lukes, MD  metoprolol tartrate (LOPRESSOR) 25 MG tablet  01/15/20   [provider]  mirtazapine (REMERON) 15 MG tablet Take 15 mg by mouth at bedtime. 11/04/20   [provider]  tetrahydrozoline-zinc (VISINE-AC) 0.05-0.25 % ophthalmic solution Place 1 drop into both eyes daily as needed (dry eyes).     [provider]    Allergies    Iohexol  Review of Systems   Review of Systems  Constitutional: Negative for chills and fever.  HENT: Negative for ear pain and sore throat.   Eyes: Negative for pain and visual disturbance.  Respiratory: Negative for cough and shortness of breath.   Cardiovascular: Negative for chest pain and palpitations.  Gastrointestinal: Positive for abdominal pain, nausea and vomiting.  Genitourinary: Negative for dysuria and hematuria.  Musculoskeletal: Negative for arthralgias and back pain.  Skin: Negative for color change and rash.  Neurological:  Negative for seizures and syncope.  All other systems reviewed and are negative.   Physical Exam Updated Vital Signs BP (!) 157/111 (BP Location: Left Arm)   Pulse (!) 131   Resp (!) 21   SpO2 100%   Physical Exam Vitals and nursing note reviewed.  Constitutional:      General: He is not in acute distress.    Appearance: He is well-developed. He is not ill-appearing, toxic-appearing or diaphoretic.  HENT:     Head: Normocephalic and atraumatic.     Mouth/Throat:     Mouth: Mucous membranes are moist.     Pharynx: Oropharynx is clear.  Eyes:     Conjunctiva/sclera:  Conjunctivae normal.  Cardiovascular:     Rate and Rhythm: Normal rate and regular rhythm.     Pulses: Normal pulses.     Heart sounds: Normal heart sounds. No murmur heard.   Pulmonary:     Effort: Pulmonary effort is normal. No respiratory distress.     Breath sounds: Normal breath sounds.  Abdominal:     General: Abdomen is flat.     Palpations: Abdomen is soft.     Tenderness: There is abdominal tenderness (Right-sided). There is right CVA tenderness. There is no left CVA tenderness.     Comments: Right urostomy pouch in place. No visible or palpable hernias    Musculoskeletal:        General: No tenderness. Normal range of motion.     Cervical back: Normal range of motion and neck supple.  Skin:    General: Skin is warm and dry.     Findings: No erythema.  Neurological:     General: No focal deficit present.     Mental Status: He is alert and oriented to person, place, and time.     Sensory: No sensory deficit.     Motor: No weakness.     ED Results / Procedures / Treatments   Labs (all labs ordered are listed, but only abnormal results are displayed) Labs Reviewed  COMPREHENSIVE METABOLIC PANEL - Abnormal; Notable for the following components:      Result Value   Sodium 150 (*)    CO2 19 (*)    Glucose, Bld 136 (*)    BUN 51 (*)    Creatinine, Ser 2.46 (*)    Albumin 3.4 (*)    GFR,  Estimated 27 (*)    Anion gap 20 (*)    All other components within normal limits  LIPASE, BLOOD - Abnormal; Notable for the following components:   Lipase 57 (*)    All other components within normal limits  CBC WITH DIFFERENTIAL/PLATELET - Abnormal; Notable for the following components:   MCV 108.5 (*)    MCH 34.4 (*)    All other components within normal limits  URINALYSIS, ROUTINE W REFLEX MICROSCOPIC - Abnormal; Notable for the following components:   APPearance HAZY (*)    Protein, ur 100 (*)    Bacteria, UA FEW (*)    Non Squamous Epithelial 0-5 (*)    All other components within normal limits  RESP PANEL BY RT-PCR (FLU A&B, COVID) ARPGX2  LACTIC ACID, PLASMA  RENAL FUNCTION PANEL  MAGNESIUM  CBC    EKG EKG Interpretation  Date/Time:  Wednesday November 12 2020 18:23:25 EST Ventricular Rate:  135 PR Interval:  112 QRS Duration: 110 QT Interval:  326 QTC Calculation: 489 R Axis:   -85 Text Interpretation: Sinus tachycardia Pulmonary disease pattern Right bundle branch block Left anterior fascicular block Confirmed by Lennice Sites [phone]) on 11/20/2020 9:17:07 PM   Radiology No results found.  Procedures .Critical Care Performed by: Garald Balding, PA-C Authorized by: Garald Balding, PA-C   Critical care provider statement:    Critical care was necessary to treat or prevent imminent or life-threatening deterioration of the following conditions:  Circulatory failure, dehydration, metabolic crisis and renal failure   (including critical care time)  Medications Ordered in ED Medications  sodium chloride 0.9 % bolus 1,000 mL (has no administration in time range)  sodium chloride 0.9 % bolus 1,000 mL (has no administration in time range)  diatrizoate meglumine-sodium (GASTROGRAFIN) 66-10 % solution 90  mL (has no administration in time range)  levETIRAcetam (KEPPRA) IVPB 500 mg/100 mL premix (has no administration in time range)  metoprolol tartrate  (LOPRESSOR) injection 2.5 mg (has no administration in time range)  heparin injection 5,000 Units (has no administration in time range)  lactated ringers infusion (has no administration in time range)    ED Course  I have reviewed the triage vital signs and the nursing notes.  Pertinent labs & imaging results that were available during my care of the patient were reviewed by me and considered in my medical decision making (see chart for details).    MDM Rules/Calculators/A&P                         74 year old male with a known SBO. Vitals on arrival with tachycardia, heart rate of 138 on arrival. Blood pressure of 145/102. Physical exam with right-sided abdominal tenderness. Right urostomy pouch in place with no signs of infection    CBC without leukocytosis, CMP with hyponatremia of 150, evidence of an AKI with a creatinine of 2.46. Creatinine 10 months ago was normal. Elevated anion gap of 20. UA without evidence of UTI. Lactic acid pending. CT of the abdomen reviewed from [facility] yesterday.NG tube ordered. Fluid resuscitation initiated. Consulted Dr. Nevada Crane with the hospitalist team who will admit the patient for further management and treatment. They request general surgery consultation.Spoke with Dr. Barry Dienes who requests plain film of the abdomen, will hold on CT.  They will see the patient in the morning. Patient remains hemodynamically stable here in the ED at this time.  Patient was seen and evaluated by Dr. Ronnald Nian who is agreeable to the above plan and disposition.  Final Clinical Impression(s) / ED Diagnoses Final diagnoses:  SBO (small bowel obstruction) [facility])    Rx / DC Orders ED Discharge Orders    None           Lyndel Safe 12/06/2020 2223    Lennice Sites, DO 11/26/2020 2326

## 2020-11-12 NOTE — ED Notes (Signed)
Unable to get NG tube to pass in the nares. Dr. Nevada Crane aware and advised that it can be done in radiology.

## 2020-11-12 NOTE — H&P (Signed)
History and Physical  Ronald Wolf LKJ:[id number] DOB: 10/31/1946 DOA: 12/11/2020  Referring physician: Walker Shadow, PA, [place] PCP: Mosie Lukes, MD  Outpatient Specialists: Neurology Patient coming from: Left AMA from [facility]  Chief Complaint: Known history of SBO  HPI: Ronald Wolf is a 74 y.o. male with medical history significant for previous CVA, tobacco use disorder, essential hypertension, history of bladder cancer status post urostomy, seizure disorder, who presented to [facility] ED from [facility] after leaving [facility] with known SBO.  Reports onset of nausea vomiting and abdominal pain hours prior to presentation at [facility] ED on 11/11/2020.  Left from [facility] today because family were unhappy with his care there and wanted him to be treated at [facility] instead.  Denies any prior history of SBO.  Denies any fevers.  Work-up in the ED revealed SBO.  TRH, hospitalist team, asked to admit for management of SBO.  EDP consulted general surgery.  ED Course: Elevated BPs, tachycardic, afebrile, O2 saturation 98% on room air.  Lab studies remarkable for lactic acidosis with lactic acid 4.4, creatinine 2.46 with baseline creatinine of 1.2, serum sodium 150, serum bicarb 19, anion gap 20.  CBC is essentially unremarkable.  Review of Systems: Review of systems as noted in the HPI. All other systems reviewed and are negative.   Past Medical History:  Diagnosis Date  . Abdominal pain, other specified site 09/26/2013  . Anxiety   . Arthritis   . Atherosclerosis of arteries 05/13/2013  . Cough 09/26/2013  . Depression   . Eating disorder   . GERD (gastroesophageal reflux disease)   . Hepatitis C   . Hypertension   . Lesion of liver 04/01/2013  . Loss of weight 06/21/2014  . Medial knee pain 09/26/2013  . Palpitations 06/21/2014  . Shortness of breath dyspnea    with exertion   Past Surgical History:  Procedure Laterality Date  . IR ANGIO INTRA  EXTRACRAN SEL COM CAROTID INNOMINATE BILAT MOD SED  09/09/2017  . IR ANGIO VERTEBRAL SEL VERTEBRAL BILAT MOD SED  09/09/2017  . IR RADIOLOGIST EVAL & MGMT  09/07/2017  . MICROLARYNGOSCOPY WITH CO2 LASER AND EXCISION OF VOCAL CORD LESION N/A 05/14/2016   Procedure: MICROLARYNGOSCOPY WITH CO2 LASER AND EXCISION OF VOCAL CORD LESION;  Surgeon: Melida Quitter, MD;  Location: [facility];  Service: ENT;  Laterality: N/A;  Suspended micro laryngoscopy with CO2 laser excision of vocal fold lesion  . TUMOR REMOVAL  1990   bladder    Social History:  reports that he has been smoking cigarettes. He has a 120.00 pack-year smoking history. He has never used smokeless tobacco. He reports current alcohol use. He reports current drug use. Drug: Marijuana.   Allergies  Allergen Reactions  . Iohexol Hives     Desc: pt broke out in hives years ago from iv contrast. he was given 50mg  benadryl po, 1 hr prior to ct and did fine today w/o complications.  JB     Family History  Problem Relation Age of Onset  . Diabetes Mother   . Arthritis Mother   . Healthy Sister        x 2  . Cancer Brother        stomach  . Dementia Father   . Prostate cancer Neg Hx   . Colon cancer Neg Hx   . Breast cancer Neg Hx   . Heart disease Neg Hx   . Hypertension Neg Hx  Prior to Admission medications   Medication Sig Start Date End Date Taking? Authorizing Provider  amLODipine (NORVASC) 10 MG tablet Take 1 tablet (10 mg total) by mouth daily. 02/11/20   Mosie Lukes, MD  atorvastatin (LIPITOR) 80 MG tablet TAKE 1 TABLET(80 MG) BY MOUTH DAILY AT 6 PM 02/11/20   Mosie Lukes, MD  carvedilol (COREG) 3.125 MG tablet Take 1 tablet (3.125 mg total) by mouth 2 (two) times daily with a meal. 02/11/20   Mosie Lukes, MD  clopidogrel (PLAVIX) 75 MG tablet TAKE 1 TABLET(75 MG) BY MOUTH DAILY 08/21/20   Mosie Lukes, MD  folic acid (FOLVITE) 1 MG tablet Take 1 tablet (1 mg total) by mouth daily. 02/11/20   Mosie Lukes, MD   levETIRAcetam (KEPPRA) 750 MG tablet TAKE 1 TABLET(750 MG) BY MOUTH TWICE DAILY 06/30/20   Mosie Lukes, MD  metoprolol tartrate (LOPRESSOR) 25 MG tablet  01/15/20   [provider]  mirtazapine (REMERON) 15 MG tablet Take 15 mg by mouth at bedtime. 11/04/20   [provider]  tetrahydrozoline-zinc (VISINE-AC) 0.05-0.25 % ophthalmic solution Place 1 drop into both eyes daily as needed (dry eyes).     [provider]    Physical Exam: BP (!) 157/111 (BP Location: Left Arm)   Pulse (!) 131   Resp (!) 21   SpO2 100%   . General: 74 y.o. year-old male well developed well nourished in no acute distress.  Alert and minimally interactive. . Cardiovascular: Regular rate and rhythm with no rubs or gallops.  No thyromegaly or JVD noted.  No lower extremity edema. 2/4 pulses in all 4 extremities. Marland Kitchen Respiratory: Clear to auscultation with no wheezes or rales. Good inspiratory effort. . Abdomen: Mildly distended, right-sided urostomy bag in place.  Hypoactive bowel sounds.  Diffusely tender on palpation.   . Muskuloskeletal: No cyanosis, clubbing or edema noted bilaterally . Neuro: CN II-XII intact, strength, sensation, reflexes . Skin: No ulcerative lesions noted or rashes . Psychiatry: Judgement and insight appear normal. Mood is appropriate for condition and setting          Labs on Admission:  Basic Metabolic Panel: Recent Labs  Lab 11/24/2020 1830  NA 150*  K 4.2  CL 111  CO2 19*  GLUCOSE 136*  BUN 51*  CREATININE 2.46*  CALCIUM 9.0   Liver Function Tests: Recent Labs  Lab 12/06/2020 1830  AST 22  ALT 18  ALKPHOS 73  BILITOT 0.8  PROT 7.1  ALBUMIN 3.4*   Recent Labs  Lab 11/23/2020 1830  LIPASE 57*   No results for input(s): AMMONIA in the last 168 hours. CBC: Recent Labs  Lab 12/10/2020 1830  WBC 9.4  NEUTROABS 7.2  HGB 14.6  HCT 46.1  MCV 108.5*  PLT 389   Cardiac Enzymes: No results for input(s): CKTOTAL, CKMB, CKMBINDEX, TROPONINI in  the last 168 hours.  BNP (last 3 results) No results for input(s): BNP in the last 8760 hours.  ProBNP (last 3 results) No results for input(s): PROBNP in the last 8760 hours.  CBG: No results for input(s): GLUCAP in the last 168 hours.  Radiological Exams on Admission: No results found.  EKG: I independently viewed the EKG done and my findings are as followed: Sinus tachycardia rate of 135.  Nonspecific ST-T changes.  Assessment/Plan Present on Admission: . SBO (small bowel obstruction) (HCC)  Active Problems:   SBO (small bowel obstruction) (HCC)  SBO History of bladder cancer status  post urostomy SBO protocol in place, NG tube to be placed in the ED Closely monitor vital signs and electrolytes Goal potassium greater than 4.0 and magnesium greater than 2.0 Start IV fluids, keep n.p.o. IV antiemetics Mobilize as tolerated Repeat abdominal x-ray in the morning General surgery has been consulted by EDP  Lactic acidosis in the setting of SBO Presented with lactic acid 4.4 Received IV fluid boluses x 2 L Continue IV fluid Repeat lactic acid and trend  AKI on CKD 2 suspect prerenal in the setting of dehydration from nausea and vomiting and SBO Baseline creatinine appears to be 1.2 with GFR greater than 60 Presented with creatinine of 2.4 with GFR 27 Monitor urine output Avoid nephrotoxins and dehydration Daily renal panel  Hypovolemic hypernatremia Presented with serum sodium 150 Start D5 LR at 100 cc/hr x 2 days Closely monitor sodium level Repeat BMP in the morning  Anion gap metabolic acidosis secondary to lactic acidosis Presented with serum bicarb of 19 with anion gap of 20, lactic acid 4.4 Received IV fluid boluses Trend lactic acid Continue IV fluids Closely monitor serum bicarb If serum bicarb and lactic acidosis worsen, consider obtaining VBG and treating with isotonic bicarb drip if PH<7.2.  Essential hypertension BP not at goal Currently n.p.o.,  hold home oral anti-hypertensive medications Start IV Lopressor 5 mg 3 times daily IV hydralazine as needed with parameters Monitor vital signs  Seizure disorder Hold off home oral Keppra due to n.p.o. Start IV Keppra 500 mg twice daily Seizure precautions  Asymptomatic pyuria No evidence of systemic infection, afebrile with no leukocytosis Obtain urine culture Monitor for now  History of bladder cancer status post urostomy Monitor   History of CVA Statin and antiplatelet on hold due to n.p.o.  Hyperlipidemia Lipitor on hold due to n.p.o.  Tobacco use disorder Nicotine patch as needed      DVT prophylaxis: Subcu heparin 3 times daily  Code Status: Full code as stated by the patient himself  Family Communication: Updated daughter at bedside.   In the morning please update family, Helene Kelp at [phone].  Disposition Plan: Admit to telemetry medical  Consults called: General surgery consulted by EDP  Admission status: Inpatient status   Status is: Inpatient    Dispo:  Patient From: Home  Planned Disposition: Home  Expected discharge date: 11/14/20  Medically stable for discharge: No, ongoing management of SBO and AKI.        Kayleen Memos MD [facility] Pager [phone]  If 7PM-7AM, please contact night-coverage [email or web address] Password [email or web address]  11/14/2020, 9:50 PM

## 2020-11-12 NOTE — ED Triage Notes (Signed)
Pt here from [facility] where he signed out ama pt had a ct scan the showed a sbo and had an elevated white count

## 2020-11-12 NOTE — ED Notes (Signed)
Dr. Nevada Crane is at bedside at this time. Notified of lactic acid 4.4

## 2020-11-12 NOTE — ED Notes (Signed)
Attempted temp again, temp probe would not pick up.

## 2020-11-12 NOTE — ED Notes (Signed)
Temperature timed out and got 94.5 on the third attempt. Unable to get temperature with good fidelity

## 2020-11-13 ENCOUNTER — Inpatient Hospital Stay (HOSPITAL_COMMUNITY): Payer: Medicare PPO

## 2020-11-13 DIAGNOSIS — E872 Acidosis, unspecified: Secondary | ICD-10-CM | POA: Diagnosis present

## 2020-11-13 DIAGNOSIS — R569 Unspecified convulsions: Secondary | ICD-10-CM

## 2020-11-13 DIAGNOSIS — N179 Acute kidney failure, unspecified: Secondary | ICD-10-CM | POA: Diagnosis present

## 2020-11-13 DIAGNOSIS — E870 Hyperosmolality and hypernatremia: Secondary | ICD-10-CM

## 2020-11-13 LAB — MAGNESIUM: Magnesium: 1.9 mg/dL (ref 1.7–2.4)

## 2020-11-13 LAB — RENAL FUNCTION PANEL
Albumin: 2.8 g/dL — ABNORMAL LOW (ref 3.5–5.0)
Anion gap: 15 (ref 5–15)
BUN: 52 mg/dL — ABNORMAL HIGH (ref 8–23)
CO2: 17 mmol/L — ABNORMAL LOW (ref 22–32)
Calcium: 8.2 mg/dL — ABNORMAL LOW (ref 8.9–10.3)
Chloride: 119 mmol/L — ABNORMAL HIGH (ref 98–111)
Creatinine, Ser: 2.08 mg/dL — ABNORMAL HIGH (ref 0.61–1.24)
GFR, Estimated: 33 mL/min — ABNORMAL LOW (ref >60)
Glucose, Bld: 119 mg/dL — ABNORMAL HIGH (ref 70–99)
Phosphorus: 2.6 mg/dL (ref 2.5–4.6)
Potassium: 3.9 mmol/L (ref 3.5–5.1)
Sodium: 151 mmol/L — ABNORMAL HIGH (ref 135–145)

## 2020-11-13 LAB — CBC
HCT: 35.8 % — ABNORMAL LOW (ref 39.0–52.0)
Hemoglobin: 11.7 g/dL — ABNORMAL LOW (ref 13.0–17.0)
MCH: 34.1 pg — ABNORMAL HIGH (ref 26.0–34.0)
MCHC: 32.7 g/dL (ref 30.0–36.0)
MCV: 104.4 fL — ABNORMAL HIGH (ref 80.0–100.0)
Platelets: 281 10*3/uL (ref 150–400)
RBC: 3.43 MIL/uL — ABNORMAL LOW (ref 4.22–5.81)
RDW: 12.6 % (ref 11.5–15.5)
WBC: 7.6 10*3/uL (ref 4.0–10.5)
nRBC: 0 % (ref 0.0–0.2)

## 2020-11-13 LAB — LACTIC ACID, PLASMA
Lactic Acid, Venous: 3 mmol/L (ref 0.5–1.9)
Lactic Acid, Venous: 2 mmol/L (ref 0.5–1.9)
Lactic Acid, Venous: 2 mmol/L (ref 0.5–1.9)

## 2020-11-13 MED ORDER — MORPHINE SULFATE (PF) 2 MG/ML IV SOLN
2.0000 mg | INTRAVENOUS | Status: DC | PRN
  Administered 2020-11-13 – 2020-11-18 (×8): 2 mg via INTRAVENOUS
  Administered 2020-11-18: 4 mg via INTRAVENOUS
  Filled 2020-11-13: qty 2
  Filled 2020-11-13 (×9): qty 1

## 2020-11-13 MED ORDER — HYDRALAZINE HCL 20 MG/ML IJ SOLN
5.0000 mg | Freq: Four times a day (QID) | INTRAMUSCULAR | Status: DC | PRN
  Administered 2020-11-13 – 2020-12-13 (×6): 5 mg via INTRAVENOUS
  Filled 2020-11-13 (×6): qty 1

## 2020-11-13 MED ORDER — LABETALOL HCL 5 MG/ML IV SOLN
10.0000 mg | Freq: Once | INTRAVENOUS | Status: AC
  Administered 2020-11-13: 10 mg via INTRAVENOUS
  Filled 2020-11-13: qty 4

## 2020-11-13 MED ORDER — ONDANSETRON HCL 4 MG/2ML IJ SOLN
4.0000 mg | Freq: Four times a day (QID) | INTRAMUSCULAR | Status: DC | PRN
  Administered 2020-11-14 – 2021-01-12 (×6): 4 mg via INTRAVENOUS
  Filled 2020-11-13 (×6): qty 2

## 2020-11-13 MED ORDER — SODIUM CHLORIDE 0.9 % IV SOLN
750.0000 mg | Freq: Two times a day (BID) | INTRAVENOUS | Status: DC
  Administered 2020-11-13 – 2020-11-18 (×11): 750 mg via INTRAVENOUS
  Filled 2020-11-13 (×15): qty 7.5

## 2020-11-13 MED ORDER — DEXTROSE IN LACTATED RINGERS 5 % IV SOLN: INTRAVENOUS | Status: DC

## 2020-11-13 MED ORDER — POTASSIUM CL IN DEXTROSE 5% 20 MEQ/L IV SOLN
20.0000 meq | INTRAVENOUS | Status: DC
  Administered 2020-11-14 – 2020-11-16 (×8): 20 meq via INTRAVENOUS
  Filled 2020-11-13 (×11): qty 1000

## 2020-11-13 MED ORDER — KETOROLAC TROMETHAMINE 15 MG/ML IJ SOLN
15.0000 mg | Freq: Once | INTRAMUSCULAR | Status: AC
  Administered 2020-11-13: 15 mg via INTRAVENOUS
  Filled 2020-11-13: qty 1

## 2020-11-13 MED ORDER — ACETAMINOPHEN 650 MG RE SUPP
325.0000 mg | RECTAL | Status: DC | PRN
  Administered 2020-11-24 – 2020-11-29 (×3): 325 mg via RECTAL
  Filled 2020-11-13 (×3): qty 1

## 2020-11-13 NOTE — Progress Notes (Signed)
PROGRESS NOTE    Ronald Wolf  JOA:[id number] DOB: [birth year] DOA: 11/15/2020 PCP: Mosie Lukes, MD    Brief Narrative:  74 year old male with a previous history of stroke, hypertension, bladder cancer status post urostomy, seizure disorder, presented to the emergency room from [facility] after he left [place] with no small bowel obstruction.  The patient complained of nausea, vomiting and abdominal pain.  Work-up in the emergency room revealed small bowel obstruction.  He was also noted to have lactic acidosis and acute kidney injury.  He was started on IV fluids, NG tube placed for decompression.  General surgery following.   Assessment & Plan:   Active Problems:   HTN (hypertension)   History of bladder cancer   Seizure (HCC)   History of stroke   SBO (small bowel obstruction) (HCC)   Hypernatremia   AKI (acute kidney injury) ([place])   Lactic acidosis   Small bowel obstruction -NG tube in place -Appreciate general surgery assistance -CT abdomen ordered to reevaluate bowel obstruction -Keep n.p.o. for now  Acute kidney injury -Secondary to volume depletion -Continue on IV fluids -Monitor urine output  Hypernatremia -Secondary to free water deficit -Continue hypotonic fluids  Lactic acidosis -Suspect this related to dehydration -Continue IV fluids -Checking CT abdomen to evaluate for evidence of bowel ischemia  Seizure disorder -Continue on Keppra  Hypertension -Chronically on amlodipine and Coreg -Continue IV Lopressor while n.p.o.  History of stroke -Chronically on Plavix -Resume once able  History of bladder cancer -Status post urostomy   DVT prophylaxis: SCDs  Code Status: Full code Family Communication: Discussed with patient Disposition Plan: Status is: Inpatient  Remains inpatient appropriate because:IV treatments appropriate due to intensity of illness or inability to take PO and Inpatient level of care appropriate due to  severity of illness   Dispo:  Patient From: Home  Planned Disposition: Home  Expected discharge date: 11/17/20  Medically stable for discharge: No    Consultants:   General surgery  Procedures:     Antimicrobials:       Subjective: Has not had any bowel movement or passed any gas.  NG tube in place.  He is not having abdominal pain.  No vomiting.  Objective: Vitals:   11/13/20 0130 11/13/20 0156 11/13/20 0612 11/13/20 1119  BP:  (!) 196/90 (!) 153/89 (!) 169/76  Pulse:  87 82 67  Resp:  18 18 19   Temp: 98.9 F (37.2 C) 98 F (36.7 C) 97.6 F (36.4 C) (!) 97.5 F (36.4 C)  TempSrc: Oral  Oral Oral  SpO2:  100% 100% 100%  Weight:  57.6 kg      Intake/Output Summary (Last 24 hours) at 11/13/2020 1253 Last data filed at 11/13/2020 0853 Gross per 24 hour  Intake 411.79 ml  Output 450 ml  Net -38.21 ml   Filed Weights   11/13/20 0156  Weight: 57.6 kg    Examination:  General exam: Appears calm and comfortable  Respiratory system: Clear to auscultation. Respiratory effort normal. Cardiovascular system: S1 & S2 heard, RRR. No JVD, murmurs, rubs, gallops or clicks. No pedal edema. Gastrointestinal system: Abdomen is distended, tender in the lower abdomen. No organomegaly or masses felt. Normal bowel sounds absent.  Right lower quadrant urostomy in place Central nervous system: Alert and oriented. No focal neurological deficits. Extremities: Symmetric 5 x 5 power. Skin: No rashes, lesions or ulcers Psychiatry: Judgement and insight appear normal. Mood & affect appropriate.     Data Reviewed:  I have personally reviewed following labs and imaging studies  CBC: Recent Labs  Lab 11/19/2020 1830 11/13/20 0356  WBC 9.4 7.6  NEUTROABS 7.2  --   HGB 14.6 11.7*  HCT 46.1 35.8*  MCV 108.5* 104.4*  PLT 389 245   Basic Metabolic Panel: Recent Labs  Lab 11/21/2020 1830 11/13/20 0356  NA 150* 151*  K 4.2 3.9  CL 111 119*  CO2 19* 17*  GLUCOSE 136* 119*   BUN 51* 52*  CREATININE 2.46* 2.08*  CALCIUM 9.0 8.2*  MG  --  1.9  PHOS  --  2.6   GFR: CrCl cannot be calculated (Unknown ideal weight.). Liver Function Tests: Recent Labs  Lab 11/16/2020 1830 11/13/20 0356  AST 22  --   ALT 18  --   ALKPHOS 73  --   BILITOT 0.8  --   PROT 7.1  --   ALBUMIN 3.4* 2.8*   Recent Labs  Lab 12/04/2020 1830  LIPASE 57*   No results for input(s): AMMONIA in the last 168 hours. Coagulation Profile: No results for input(s): INR, PROTIME in the last 168 hours. Cardiac Enzymes: No results for input(s): CKTOTAL, CKMB, CKMBINDEX, TROPONINI in the last 168 hours. BNP (last 3 results) No results for input(s): PROBNP in the last 8760 hours. HbA1C: No results for input(s): HGBA1C in the last 72 hours. CBG: No results for input(s): GLUCAP in the last 168 hours. Lipid Profile: No results for input(s): CHOL, HDL, LDLCALC, TRIG, CHOLHDL, LDLDIRECT in the last 72 hours. Thyroid Function Tests: No results for input(s): TSH, T4TOTAL, FREET4, T3FREE, THYROIDAB in the last 72 hours. Anemia Panel: No results for input(s): VITAMINB12, FOLATE, FERRITIN, TIBC, IRON, RETICCTPCT in the last 72 hours. Sepsis Labs: Recent Labs  Lab 12/04/2020 2107 11/13/20 0145 11/13/20 0356 11/13/20 0602  LATICACIDVEN 4.4* 3.0* 2.0* 2.0*    Recent Results (from the past 240 hour(s))  Resp Panel by RT-PCR (Flu A&B, Covid) Nasopharyngeal Swab     Status: None   Collection Time: 11/21/2020 10:46 PM   Specimen: Nasopharyngeal Swab; Nasopharyngeal(NP) swabs in vial transport medium  Result Value Ref Range Status   SARS Coronavirus 2 by RT PCR NEGATIVE NEGATIVE Final    Comment: (NOTE) SARS-CoV-2 target nucleic acids are NOT DETECTED.  The SARS-CoV-2 RNA is generally detectable in upper respiratory specimens during the acute phase of infection. The lowest concentration of SARS-CoV-2 viral copies this assay can detect is 138 copies/mL. A negative result does not preclude  SARS-Cov-2 infection and should not be used as the sole basis for treatment or other patient management decisions. A negative result may occur with  improper specimen collection/handling, submission of specimen other than nasopharyngeal swab, presence of viral mutation(s) within the areas targeted by this assay, and inadequate number of viral copies(<138 copies/mL). A negative result must be combined with clinical observations, patient history, and epidemiological information. The expected result is Negative.  Fact Sheet for Patients:  EntrepreneurPulse.com.au  Fact Sheet for Healthcare Providers:  IncredibleEmployment.be  This test is no t yet approved or cleared by the [place] FDA and  has been authorized for detection and/or diagnosis of SARS-CoV-2 by FDA under an Emergency Use Authorization (EUA). This EUA will remain  in effect (meaning this test can be used) for the duration of the COVID-19 declaration under Section 564(b)(1) of the Act, 21 U.S.C.section 360bbb-3(b)(1), unless the authorization is terminated  or revoked sooner.       Influenza A by PCR NEGATIVE NEGATIVE Final  Influenza B by PCR NEGATIVE NEGATIVE Final    Comment: (NOTE) The Xpert Xpress SARS-CoV-2/FLU/RSV plus assay is intended as an aid in the diagnosis of influenza from Nasopharyngeal swab specimens and should not be used as a sole basis for treatment. Nasal washings and aspirates are unacceptable for Xpert Xpress SARS-CoV-2/FLU/RSV testing.  Fact Sheet for Patients: EntrepreneurPulse.com.au  Fact Sheet for Healthcare Providers: IncredibleEmployment.be  This test is not yet approved or cleared by the [place] FDA and has been authorized for detection and/or diagnosis of SARS-CoV-2 by FDA under an Emergency Use Authorization (EUA). This EUA will remain in effect (meaning this test can be used) for the duration of  the COVID-19 declaration under Section 564(b)(1) of the Act, 21 U.S.C. section 360bbb-3(b)(1), unless the authorization is terminated or revoked.  Performed at [facility], [place] [address], [place], [place] [address]          Radiology Studies: DG Abd Portable 1V  Result Date: 11/13/2020 CLINICAL DATA:  Nasogastric tube placement EXAM: PORTABLE ABDOMEN - 1 VIEW COMPARISON:  November 13, 2020 study obtained earlier in the day FINDINGS: Nasogastric tube tip is in the stomach with the side port at the gastroesophageal junction. There are loops of mildly dilated bowel, similar to earlier in the day without appreciable air-fluid levels. No evident free air. Ostomy right lower quadrant noted. Surgical clips noted in the pelvis. Lung bases are clear. IMPRESSION: Nasogastric tube tip in stomach with side port at gastroesophageal junction. Advise advancing nasogastric tube 5-6 cm. Persistent bowel dilatation which may be indicative of a degree of ileus or bowel obstruction. No evident free air. Right lower quadrant ostomy noted. Electronically Signed   By: Lowella Grip III M.D.   On: 11/13/2020 08:57   DG Abd Portable 1V  Result Date: 11/13/2020 CLINICAL DATA:  Small-bowel obstruction, abdominal pain EXAM: PORTABLE ABDOMEN - 1 VIEW COMPARISON:  12/18/2010 FINDINGS: Multiple gas-filled loops of dilated bowel are seen throughout the mid abdomen. Ostomy appliance noted within the right mid abdomen. Together, the findings are suggestive of a distal small-bowel obstruction. Numerous surgical clips noted within the pelvis bilaterally related to probable iliac lymph node dissection. No gross free intraperitoneal gas. No organomegaly. Osseous structures are age-appropriate. IMPRESSION: Small-bowel obstruction, new since prior examination. Right lower quadrant ostomy noted. Electronically Signed   By: Fidela Salisbury MD   On: 11/13/2020 06:56        Scheduled Meds: . metoprolol tartrate  5 mg  Intravenous Q8H   Continuous Infusions: . dextrose 5 % with KCl 20 mEq / L    . levETIRAcetam 750 mg (11/13/20 1112)     LOS: 1 day    Time spent: 45mins   Kathie Dike, MD [facility]   If 7PM-7AM, please contact night-coverage [email or web address]  11/13/2020, 12:53 PM

## 2020-11-13 NOTE — Progress Notes (Signed)
PT Cancellation Note  Patient Details Name: Ronald Wolf MRN: [id number] DOB: 08-04-1946   Cancelled Treatment:    Reason Eval/Treat Not Completed: Fatigue/lethargy limiting ability to participate - pt sleeping upon arrival post-CT, states he does not feel like getting up and is minimally conversant with PT. Pt requesting PT check back tomorrow.   [facility] Pager [phone]  Office [phone]     Julien Girt 11/13/2020, 3:32 PM

## 2020-11-13 NOTE — Consult Note (Signed)
[facility] Surgery Consult Note  Ronald Wolf 06-29-1946  [id number].    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 This morning during my exam he is somnolent and is a poor historian. He reports 4-5 days of lower abdominal pain associated with nausea, belching. Reports last flatus/BM 5 days ago. He is unable to tell me about his past abdominal surgeries or how long he has had his ileal conduit. He states he lives alone, walks using an assistive device, and still drives. Reports smoking 2 ppd cigarettes.  Denies use of blood thinners, but per chart review he is on plavix for a history of CVA. States he doesn't take plavix and he isnt sure when he stopped taking it.  In the ED patient was tachycardic, hypertensive, with a lactic acid of 4.4. creatinine 2.46 (baseline 1.2), and CT read from outside facility reports SBO with a transition zone in the right mid abdomen. CCS was asked to see.   ROS: Review of Systems  Constitutional: Negative.   HENT: Positive for sore throat (chronic).   Eyes: Negative.   Respiratory: Negative.   Cardiovascular: Negative.   Gastrointestinal: Positive for abdominal pain and constipation.  Genitourinary: Negative.   Musculoskeletal: Negative.   Skin: Negative.   Neurological: Negative.   Endo/Heme/Allergies: Negative.   Psychiatric/Behavioral: Negative.     Family History  Problem Relation Age of Onset  . Diabetes Mother   . Arthritis Mother   . Healthy Sister        x 2  . Cancer Brother        stomach  . Dementia Father   . Prostate cancer Neg Hx   . Colon cancer Neg Hx   . Breast cancer Neg Hx   . Heart disease Neg Hx   .  Hypertension Neg Hx     Past Medical History:  Diagnosis Date  . Abdominal pain, other specified site 09/26/2013  . Anxiety   . Arthritis   . Atherosclerosis of arteries 05/13/2013  . Cough 09/26/2013  . Depression   . Eating disorder   . GERD (gastroesophageal reflux disease)   . Hepatitis C   . Hypertension   . Lesion of liver 04/01/2013  . Loss of weight 06/21/2014  . Medial knee pain 09/26/2013  . Palpitations 06/21/2014  . Shortness of breath dyspnea    with exertion    Past Surgical History:  Procedure Laterality Date  . IR ANGIO INTRA EXTRACRAN SEL COM CAROTID INNOMINATE BILAT MOD SED  09/09/2017  . IR ANGIO VERTEBRAL SEL VERTEBRAL BILAT MOD SED  09/09/2017  . IR RADIOLOGIST EVAL & MGMT  09/07/2017  . MICROLARYNGOSCOPY WITH CO2 LASER AND EXCISION OF VOCAL CORD LESION N/A 05/14/2016   Procedure: MICROLARYNGOSCOPY WITH CO2 LASER AND EXCISION OF VOCAL CORD LESION;  Surgeon: Melida Quitter, MD;  Location: [place];  Service: ENT;  Laterality: N/A;  Suspended micro laryngoscopy with CO2 laser excision of vocal fold lesion  . TUMOR REMOVAL  1990   bladder    Social History:  reports that he has been smoking cigarettes. He has a 120.00 pack-year smoking history. He has never used smokeless tobacco. He reports current alcohol use.  He reports current drug use. Drug: Marijuana.  Allergies:  Allergies  Allergen Reactions  . Iohexol Hives     Desc: pt broke out in hives years ago from iv contrast. he was given 50mg  benadryl po, 1 hr prior to ct and did fine today w/o complications.  JB     Medications Prior to Admission  Medication Sig Dispense Refill  . amLODipine (NORVASC) 10 MG tablet Take 1 tablet (10 mg total) by mouth daily. 90 tablet 1  . atorvastatin (LIPITOR) 80 MG tablet TAKE 1 TABLET(80 MG) BY MOUTH DAILY AT 6 PM 90 tablet 1  . carvedilol (COREG) 3.125 MG tablet Take 1 tablet (3.125 mg total) by mouth 2 (two) times daily with a meal. 180 tablet 1  . clopidogrel (PLAVIX) 75 MG  tablet TAKE 1 TABLET(75 MG) BY MOUTH DAILY (Patient taking differently: Take 75 mg by mouth daily. TAKE 1 TABLET(75 MG) BY MOUTH DAILY) 90 tablet 1  . folic acid (FOLVITE) 1 MG tablet Take 1 tablet (1 mg total) by mouth daily. 90 tablet 1  . levETIRAcetam (KEPPRA) 750 MG tablet TAKE 1 TABLET(750 MG) BY MOUTH TWICE DAILY (Patient taking differently: Take 750 mg by mouth 2 (two) times daily. ) 60 tablet 3  . mirtazapine (REMERON) 15 MG tablet Take 15 mg by mouth at bedtime.    Marland Kitchen tetrahydrozoline-zinc (VISINE-AC) 0.05-0.25 % ophthalmic solution Place 1 drop into both eyes daily as needed (dry eyes).       Blood pressure (!) 153/89, pulse 82, temperature 97.6 F (36.4 C), temperature source Oral, resp. rate 18, weight 57.6 kg, SpO2 100 %. Physical Exam: Constitutional: NAD; thin black elderly male laying in bed Eyes: Moist conjunctiva; no lid lag; anicteric; PERRL Neck: Trachea midline; no thyromegaly Lungs: Normal respiratory effort; no tactile fremitus CV: RRR; no palpable thrills; no pitting edema GI: Abd is distended, there is lower abdominal tenderness with guarding, RLQ urostomy is pink with clear urine in pouch, +BS hypoactive, previous laparotomy scar appears well-healing. MSK: symmetrical extremities with diffuse muscle wasting; no clubbing/cyanosis Psychiatric:  alert and oriented x3 Lymphatic: No palpable cervical or axillary lymphadenopathy Neuro: somnolent but arouses to loud voice, follows commands, moving all extremities  Results for orders placed or performed during the hospital encounter of 11/23/2020 (from the past 48 hour(s))  Comprehensive metabolic panel     Status: Abnormal   Collection Time: 12/11/2020  6:30 PM  Result Value Ref Range   Sodium 150 (H) 135 - 145 mmol/L   Potassium 4.2 3.5 - 5.1 mmol/L   Chloride 111 98 - 111 mmol/L   CO2 19 (L) 22 - 32 mmol/L   Glucose, Bld 136 (H) 70 - 99 mg/dL    Comment: Glucose reference range applies only to samples taken after  fasting for at least 8 hours.   BUN 51 (H) 8 - 23 mg/dL   Creatinine, Ser 2.46 (H) 0.61 - 1.24 mg/dL   Calcium 9.0 8.9 - 10.3 mg/dL   Total Protein 7.1 6.5 - 8.1 g/dL   Albumin 3.4 (L) 3.5 - 5.0 g/dL   AST 22 15 - 41 U/L   ALT 18 0 - 44 U/L   Alkaline Phosphatase 73 38 - 126 U/L   Total Bilirubin 0.8 0.3 - 1.2 mg/dL   GFR, Estimated 27 (L) >60 mL/min    Comment: (NOTE) Calculated using the CKD-EPI Creatinine Equation (2021)    Anion gap 20 (H) 5 - 15    Comment: Performed at [facility], [place] [address], [place], [place] [address]  Lipase, blood     Status: Abnormal   Collection Time: 12/12/2020  6:30 PM  Result Value Ref Range   Lipase 57 (H) 11 - 51 U/L    Comment: Performed at [facility] [address], [place], [place] [address]  CBC with Diff     Status: Abnormal   Collection Time: 12/09/2020  6:30 PM  Result Value Ref Range   WBC 9.4 4.0 - 10.5 K/uL   RBC 4.25 4.22 - 5.81 MIL/uL   Hemoglobin 14.6 13.0 - 17.0 g/dL   HCT 46.1 39 - 52 %   MCV 108.5 (H) 80.0 - 100.0 fL   MCH 34.4 (H) 26.0 - 34.0 pg   MCHC 31.7 30.0 - 36.0 g/dL   RDW 12.7 11.5 - 15.5 %   Platelets 389 150 - 400 K/uL   nRBC 0.0 0.0 - 0.2 %   Neutrophils Relative % 77 %   Neutro Abs 7.2 1.7 - 7.7 K/uL   Lymphocytes Relative 14 %   Lymphs Abs 1.3 0.7 - 4.0 K/uL   Monocytes Relative 9 %   Monocytes Absolute 0.8 0.1 - 1.0 K/uL   Eosinophils Relative 0 %   Eosinophils Absolute 0.0 0.0 - 0.5 K/uL   Basophils Relative 0 %   Basophils Absolute 0.0 0.0 - 0.1 K/uL   nRBC 0 0 /100 WBC   Abs Immature Granulocytes 0.00 0.00 - 0.07 K/uL    Comment: Performed at [facility], [address] [place], [place] [address]  Urinalysis, Routine w reflex microscopic Urine, Clean Catch     Status: Abnormal   Collection Time: 11/28/2020  9:02 PM  Result Value Ref Range   Color, Urine YELLOW YELLOW   APPearance HAZY (A) CLEAR   Specific Gravity, Urine 1.013 1.005 - 1.030   pH 7.0 5.0 - 8.0   Glucose, UA  NEGATIVE NEGATIVE mg/dL   Hgb urine dipstick NEGATIVE NEGATIVE   Bilirubin Urine NEGATIVE NEGATIVE   Ketones, ur NEGATIVE NEGATIVE mg/dL   Protein, ur 100 (A) NEGATIVE mg/dL   Nitrite NEGATIVE NEGATIVE   Leukocytes,Ua NEGATIVE NEGATIVE   RBC / HPF 6-10 0 - 5 RBC/hpf   WBC, UA 11-20 0 - 5 WBC/hpf   Bacteria, UA FEW (A) NONE SEEN   Squamous Epithelial / LPF 0-5 0 - 5   Mucus PRESENT    Hyaline Casts, UA PRESENT    Non Squamous Epithelial 0-5 (A) NONE SEEN    Comment: Performed at [facility], [place] [address] [place], [place] [address]  Lactic acid, plasma     Status: Abnormal   Collection Time: 12/09/2020  9:07 PM  Result Value Ref Range   Lactic Acid, Venous 4.4 (HH) 0.5 - 1.9 mmol/L    Comment: CRITICAL RESULT CALLED TO, READ BACK BY AND VERIFIED WITH: ISLEY K,RN 12/03/2020 2221 WAYK Performed at [facility] [address], [place], [place] [address]   Resp Panel by RT-PCR (Flu A&B, Covid) Nasopharyngeal Swab     Status: None   Collection Time: 11/21/2020 10:46 PM   Specimen: Nasopharyngeal Swab; Nasopharyngeal(NP) swabs in vial transport medium  Result Value Ref Range   SARS Coronavirus 2 by RT PCR NEGATIVE NEGATIVE    Comment: (NOTE) SARS-CoV-2 target nucleic acids are NOT DETECTED.  The SARS-CoV-2 RNA is generally detectable in upper respiratory specimens during the acute phase of infection. The lowest concentration of SARS-CoV-2 viral  copies this assay can detect is 138 copies/mL. A negative result does not preclude SARS-Cov-2 infection and should not be used as the sole basis for treatment or other patient management decisions. A negative result may occur with  improper specimen collection/handling, submission of specimen other than nasopharyngeal swab, presence of viral mutation(s) within the areas targeted by this assay, and inadequate number of viral copies(<138 copies/mL). A negative result must be combined with clinical observations, patient history, and  epidemiological information. The expected result is Negative.  Fact Sheet for Patients:  EntrepreneurPulse.com.au  Fact Sheet for Healthcare Providers:  IncredibleEmployment.be  This test is no t yet approved or cleared by the [place] FDA and  has been authorized for detection and/or diagnosis of SARS-CoV-2 by FDA under an Emergency Use Authorization (EUA). This EUA will remain  in effect (meaning this test can be used) for the duration of the COVID-19 declaration under Section 564(b)(1) of the Act, 21 U.S.C.section 360bbb-3(b)(1), unless the authorization is terminated  or revoked sooner.       Influenza A by PCR NEGATIVE NEGATIVE   Influenza B by PCR NEGATIVE NEGATIVE    Comment: (NOTE) The Xpert Xpress SARS-CoV-2/FLU/RSV plus assay is intended as an aid in the diagnosis of influenza from Nasopharyngeal swab specimens and should not be used as a sole basis for treatment. Nasal washings and aspirates are unacceptable for Xpert Xpress SARS-CoV-2/FLU/RSV testing.  Fact Sheet for Patients: EntrepreneurPulse.com.au  Fact Sheet for Healthcare Providers: IncredibleEmployment.be  This test is not yet approved or cleared by the [place] FDA and has been authorized for detection and/or diagnosis of SARS-CoV-2 by FDA under an Emergency Use Authorization (EUA). This EUA will remain in effect (meaning this test can be used) for the duration of the COVID-19 declaration under Section 564(b)(1) of the Act, 21 U.S.C. section 360bbb-3(b)(1), unless the authorization is terminated or revoked.  Performed at [facility], [place] [address], [place], [place] [address]   Lactic acid, plasma     Status: Abnormal   Collection Time: 11/13/20  1:45 AM  Result Value Ref Range   Lactic Acid, Venous 3.0 (HH) 0.5 - 1.9 mmol/L    Comment: CRITICAL VALUE NOTED.  VALUE IS CONSISTENT WITH PREVIOUSLY REPORTED AND  CALLED VALUE. Performed at [facility], [place] [address], [place], [place] [address]   Renal function panel     Status: Abnormal   Collection Time: 11/13/20  3:56 AM  Result Value Ref Range   Sodium 151 (H) 135 - 145 mmol/L   Potassium 3.9 3.5 - 5.1 mmol/L   Chloride 119 (H) 98 - 111 mmol/L   CO2 17 (L) 22 - 32 mmol/L   Glucose, Bld 119 (H) 70 - 99 mg/dL    Comment: Glucose reference range applies only to samples taken after fasting for at least 8 hours.   BUN 52 (H) 8 - 23 mg/dL   Creatinine, Ser 2.08 (H) 0.61 - 1.24 mg/dL   Calcium 8.2 (L) 8.9 - 10.3 mg/dL   Phosphorus 2.6 2.5 - 4.6 mg/dL   Albumin 2.8 (L) 3.5 - 5.0 g/dL   GFR, Estimated 33 (L) >60 mL/min    Comment: (NOTE) Calculated using the CKD-EPI Creatinine Equation (2021)    Anion gap 15 5 - 15    Comment: Performed at [facility] [address] [place], [place] [address]  Magnesium     Status: None   Collection Time: 11/13/20  3:56 AM  Result Value  Ref Range   Magnesium 1.9 1.7 - 2.4 mg/dL    Comment: Performed at [facility] [address], [place], [place] [address]  CBC     Status: Abnormal   Collection Time: 11/13/20  3:56 AM  Result Value Ref Range   WBC 7.6 4.0 - 10.5 K/uL   RBC 3.43 (L) 4.22 - 5.81 MIL/uL   Hemoglobin 11.7 (L) 13.0 - 17.0 g/dL   HCT 35.8 (L) 39 - 52 %   MCV 104.4 (H) 80.0 - 100.0 fL   MCH 34.1 (H) 26.0 - 34.0 pg   MCHC 32.7 30.0 - 36.0 g/dL   RDW 12.6 11.5 - 15.5 %   Platelets 281 150 - 400 K/uL   nRBC 0.0 0.0 - 0.2 %    Comment: Performed at [facility], [place] [address], [place], [place] [address]  Lactic acid, plasma     Status: Abnormal   Collection Time: 11/13/20  3:56 AM  Result Value Ref Range   Lactic Acid, Venous 2.0 (HH) 0.5 - 1.9 mmol/L    Comment: CRITICAL VALUE NOTED.  VALUE IS CONSISTENT WITH PREVIOUSLY REPORTED AND CALLED VALUE. Performed at [facility], [place] [address], [place], [place] [address]   Lactic acid, plasma     Status:  Abnormal   Collection Time: 11/13/20  6:02 AM  Result Value Ref Range   Lactic Acid, Venous 2.0 (HH) 0.5 - 1.9 mmol/L    Comment: CRITICAL VALUE NOTED.  VALUE IS CONSISTENT WITH PREVIOUSLY REPORTED AND CALLED VALUE. Performed at [facility], [place] [address], [place], [place] [address]    DG Abd Portable 1V  Result Date: 11/13/2020 CLINICAL DATA:  Small-bowel obstruction, abdominal pain EXAM: PORTABLE ABDOMEN - 1 VIEW COMPARISON:  12/18/2010 FINDINGS: Multiple gas-filled loops of dilated bowel are seen throughout the mid abdomen. Ostomy appliance noted within the right mid abdomen. Together, the findings are suggestive of a distal small-bowel obstruction. Numerous surgical clips noted within the pelvis bilaterally related to probable iliac lymph node dissection. No gross free intraperitoneal gas. No organomegaly. Osseous structures are age-appropriate. IMPRESSION: Small-bowel obstruction, new since prior examination. Right lower quadrant ostomy noted. Electronically Signed   By: Fidela Salisbury MD   On: 11/13/2020 06:56   Assessment/Plan Small Bowel Obstruction  - hemodynamically stable currently, WBC 7.6  - lactic acid is 2.0 from 4.4, has not cleared, concerning for possible bowel ischemia  - attempt NG tube placement now followed by STAT CT ABD/PELVIS W/O contrast given renal function, give PO contrast per NG tube.  - CCS will follow closely, may need exploratory laparotomy based on CT scan results or if he clinically deteriorates.   PMH bladder cancer s/p cystectomy and ileal conduit  AKI on CKD - Creatinine 2.08 from 2.46 PMH CVA - hold plavix, patient states he isn't taking this and is not sure when he stopped. HTN Tobacco abuse   FEN: NPO, NG tube to LIWS  ID: None VTE: SCD's, hold chemical VTE for possible surgery Dispo: SDU, NPO, stat CT Abd/pelv  Jill Alexanders, [facility] Surgery Please see Amion for pager number during day hours 7:00am-4:30pm 11/13/2020,  7:40 AM

## 2020-11-13 NOTE — ED Notes (Signed)
Report given to floor Elisabeth Cara, RN.

## 2020-11-13 NOTE — Progress Notes (Signed)
Patient arrived to 6N20. Report received from RN. Patient alert and oriented 3. See assessment. See MAR. Patient call bell within reach. Will continue to monitor.

## 2020-11-13 NOTE — ED Notes (Signed)
Pt has been unable to provide a urine specimen at this time.

## 2020-11-13 NOTE — ED Notes (Signed)
Phlebotomist is obtaining lab specimens at this time.

## 2020-11-14 ENCOUNTER — Inpatient Hospital Stay (HOSPITAL_COMMUNITY): Payer: Medicare PPO

## 2020-11-14 LAB — CBC
HCT: 35.4 % — ABNORMAL LOW (ref 39.0–52.0)
Hemoglobin: 11.6 g/dL — ABNORMAL LOW (ref 13.0–17.0)
MCH: 34.5 pg — ABNORMAL HIGH (ref 26.0–34.0)
MCHC: 32.8 g/dL (ref 30.0–36.0)
MCV: 105.4 fL — ABNORMAL HIGH (ref 80.0–100.0)
Platelets: 293 10*3/uL (ref 150–400)
RBC: 3.36 MIL/uL — ABNORMAL LOW (ref 4.22–5.81)
RDW: 12.3 % (ref 11.5–15.5)
WBC: 6.4 10*3/uL (ref 4.0–10.5)
nRBC: 0 % (ref 0.0–0.2)

## 2020-11-14 LAB — RENAL FUNCTION PANEL
Albumin: 2.8 g/dL — ABNORMAL LOW (ref 3.5–5.0)
Anion gap: 9 (ref 5–15)
BUN: 33 mg/dL — ABNORMAL HIGH (ref 8–23)
CO2: 24 mmol/L (ref 22–32)
Calcium: 8.5 mg/dL — ABNORMAL LOW (ref 8.9–10.3)
Chloride: 113 mmol/L — ABNORMAL HIGH (ref 98–111)
Creatinine, Ser: 1.54 mg/dL — ABNORMAL HIGH (ref 0.61–1.24)
GFR, Estimated: 47 mL/min — ABNORMAL LOW (ref >60)
Glucose, Bld: 125 mg/dL — ABNORMAL HIGH (ref 70–99)
Phosphorus: 2.4 mg/dL — ABNORMAL LOW (ref 2.5–4.6)
Potassium: 3.9 mmol/L (ref 3.5–5.1)
Sodium: 146 mmol/L — ABNORMAL HIGH (ref 135–145)

## 2020-11-14 LAB — LACTIC ACID, PLASMA: Lactic Acid, Venous: 1.4 mmol/L (ref 0.5–1.9)

## 2020-11-14 MED ORDER — SODIUM CHLORIDE 0.9% FLUSH: 10.0000 mL | INTRAVENOUS | Status: DC | PRN

## 2020-11-14 MED ORDER — CHLORHEXIDINE GLUCONATE 0.12 % MT SOLN
15.0000 mL | Freq: Two times a day (BID) | OROMUCOSAL | Status: DC
  Administered 2020-11-15 – 2020-11-17 (×5): 15 mL via OROMUCOSAL
  Filled 2020-11-14 (×7): qty 15

## 2020-11-14 MED ORDER — SODIUM CHLORIDE 0.9% FLUSH
10.0000 mL | Freq: Two times a day (BID) | INTRAVENOUS | Status: DC
  Administered 2020-11-18: 10 mL

## 2020-11-14 MED ORDER — ENOXAPARIN SODIUM 30 MG/0.3ML ~~LOC~~ SOLN
30.0000 mg | Freq: Every day | SUBCUTANEOUS | Status: DC
  Administered 2020-11-14: 30 mg via SUBCUTANEOUS
  Filled 2020-11-14: qty 0.3

## 2020-11-14 MED ORDER — ENOXAPARIN SODIUM 30 MG/0.3ML ~~LOC~~ SOLN
30.0000 mg | Freq: Two times a day (BID) | SUBCUTANEOUS | Status: DC
Start: 2020-11-14 — End: 2020-11-14

## 2020-11-14 MED ORDER — DIATRIZOATE MEGLUMINE & SODIUM 66-10 % PO SOLN
90.0000 mL | Freq: Once | ORAL | Status: AC
  Administered 2020-11-14: 90 mL via NASOGASTRIC
  Filled 2020-11-14: qty 90

## 2020-11-14 MED ORDER — ORAL CARE MOUTH RINSE
15.0000 mL | Freq: Two times a day (BID) | OROMUCOSAL | Status: DC
  Administered 2020-11-14 – 2020-11-17 (×7): 15 mL via OROMUCOSAL

## 2020-11-14 MED ORDER — CHLORHEXIDINE GLUCONATE CLOTH 2 % EX PADS
6.0000 | MEDICATED_PAD | Freq: Every day | CUTANEOUS | Status: DC
  Administered 2020-11-14 – 2020-11-29 (×14): 6 via TOPICAL

## 2020-11-14 NOTE — Care Management Important Message (Signed)
Important Message  Patient Details  Name: Ronald Wolf MRN: [id number] Date of Birth: Jan 01, 1946   Medicare Important Message Given:  Yes  Due to staffing Patient room called no answer IMi mailed to the patient home address.   Rubert Frediani 11/14/2020, 1:34 PM

## 2020-11-14 NOTE — Evaluation (Signed)
Physical Therapy Evaluation Patient Details Name: Ronald Wolf MRN: [id number] DOB: [birth year] Today's Date: 11/14/2020   History of Present Illness  Pt is a 74 y.o. male admitted 12/07/2020 with abdominal pain and nausea; workup revealed SBO, s/p NGT placement. Also with AKI. Per surgery MD, failure to improve in 24 hrs may warrant ex lap. PMH includes stroke, HTN, bladder CA s/p urostomy, seizure, Hep C, anxiety, depression.    Clinical Impression  Pt presents with an overall decrease in functional mobility secondary to above. Pt questionable historian and not answering majority of questions, although following most simple commands appropriately. Pt reports that PTA, living alone and walking with cane. Today, pt able to initiate transfer training and take a few steps with RW, requiring modA for stability. Limited by generalized weakness, decreased activity tolerance and impaired balance; at high risk for falls. Pt would benefit from continued acute PT services to maximize functional mobility and independence prior to d/c with SNF-level therapies.     Follow Up Recommendations SNF;Supervision for mobility/OOB    Equipment Recommendations  Rolling walker with 5" wheels;3in1 (PT);Wheelchair (measurements PT);Wheelchair cushion (measurements PT)    Recommendations for Other Services       Precautions / Restrictions Precautions Precautions: Fall;Other (comment) Precaution Comments: NGT Restrictions Weight Bearing Restrictions: No      Mobility  Bed Mobility Overal bed mobility: Needs Assistance Bed Mobility: Supine to Sit     Supine to sit: Mod assist;HOB elevated     General bed mobility comments: ModA for HHA to elevate trunk    Transfers Overall transfer level: Needs assistance Equipment used: Rolling walker (2 wheeled) Transfers: Sit to/from Stand Sit to Stand: Mod assist         General transfer comment: Increased time and effort in preparation to stand, modA for trunk  elevation and stability  Ambulation/Gait Ambulation/Gait assistance: Min assist Gait Distance (Feet): 1 Feet Assistive device: Rolling walker (2 wheeled) Gait Pattern/deviations: Step-to pattern;Trunk flexed Gait velocity: Decreased   General Gait Details: Pivotal steps from bed to recliner with RW and minA for stability; cues for sequencing and RW navigation; pt declined further mobility, limited by fatigue  Stairs            Wheelchair Mobility    Modified Rankin (Stroke Patients Only)       Balance Overall balance assessment: Needs assistance   Sitting balance-Leahy Scale: Fair       Standing balance-Leahy Scale: Poor                               Pertinent Vitals/Pain Pain Assessment: Faces Faces Pain Scale: Hurts a little bit Pain Location: Generalized Pain Descriptors / Indicators: Discomfort;Tiring Pain Intervention(s): Monitored during session;Limited activity within patient's tolerance    Home Living Family/patient expects to be discharged to:: Private residence Living Arrangements: Alone               Additional Comments: Questionable historian and not consistently answering questions - pt reports living alone with no family available to assist    Prior Function Level of Independence: Needs assistance         Comments: Questionable historian and not consistently answering questions - reports ambulates with cane. Pt with urostomy bag     Hand Dominance        Extremity/Trunk Assessment   Upper Extremity Assessment Upper Extremity Assessment: Generalized weakness    Lower Extremity Assessment Lower Extremity Assessment: Generalized  weakness       Communication   Communication: Receptive difficulties;Expressive difficulties (questionable)  Cognition Arousal/Alertness: Awake/alert Behavior During Therapy: Flat affect Overall Cognitive Status: No family/caregiver present to determine baseline cognitive functioning                                  General Comments: Very flat affect, minimal verbalizations; not answering orientation questions beyond first name, but following majority of simple commands appropriately      General Comments      Exercises     Assessment/Plan    PT Assessment Patient needs continued PT services  PT Problem List Decreased strength;Decreased activity tolerance;Decreased balance;Decreased mobility;Decreased cognition;Decreased knowledge of use of DME;Decreased safety awareness       PT Treatment Interventions DME instruction;Gait training;Stair training;Functional mobility training;Therapeutic activities;Therapeutic exercise;Balance training;Cognitive remediation;Patient/family education;Wheelchair mobility training    PT Goals (Current goals can be found in the Care Plan section)  Acute Rehab PT Goals Patient Stated Goal: "I don't feel like getting up" PT Goal Formulation: With patient Time For Goal Achievement: 11/28/20 Potential to Achieve Goals: Fair    Frequency Min 3X/week   Barriers to discharge Decreased caregiver support      Co-evaluation               AM-PAC PT "6 Clicks" Mobility  Outcome Measure Help needed turning from your back to your side while in a flat bed without using bedrails?: A Lot Help needed moving from lying on your back to sitting on the side of a flat bed without using bedrails?: A Lot Help needed moving to and from a bed to a chair (including a wheelchair)?: A Lot Help needed standing up from a chair using your arms (e.g., wheelchair or bedside chair)?: A Lot Help needed to walk in hospital room?: A Lot Help needed climbing 3-5 steps with a railing? : A Lot 6 Click Score: 12    End of Session   Activity Tolerance: Patient tolerated treatment well;Patient limited by fatigue Patient left: in chair;with call bell/phone within reach;with chair alarm set Nurse Communication: Mobility status PT Visit Diagnosis:  Other abnormalities of gait and mobility (R26.89);Muscle weakness (generalized) (M62.81)    Time: 0051-1021 PT Time Calculation (min) (ACUTE ONLY): 21 min   Charges:   PT Evaluation $PT Eval Moderate Complexity: 1 Mod     Mabeline Caras, PT, DPT Acute Rehabilitation Services  Pager [phone] Office [place] 11/14/2020, 4:41 PM

## 2020-11-14 NOTE — Progress Notes (Addendum)
[facility] Progress Note     Subjective: CC:  States he is worse today. Unable to tell me how he is worse. Denies flatus/BM. States he does not want surgery but if he was going to die without it then he would want it.   Speech is intermittently confused. Oriented to self only. States he is in a doctors office and unable to report the year. Says he has a brother, [name], who helps him make important decisions.  Objective: Vital signs in last 24 hours: Temp:  [97.4 F (36.3 C)-97.7 F (36.5 C)] 97.6 F (36.4 C) (12/03 0603) Pulse Rate:  [67-102] 79 (12/03 0603) Resp:  [17-19] 17 (12/03 0603) BP: (150-173)/(76-83) 150/76 (12/03 0603) SpO2:  [99 %-100 %] 100 % (12/03 0603)    Intake/Output from previous day: 12/02 0701 - 12/03 0700 In: 592.2 [I.V.:377.2; IV Piggyback:215] Out: 1300 [Urine:800; Emesis/NG output:500] Intake/Output this shift: No intake/output data recorded.  PE: Gen:  Somnolent, ill-appearing male Card:  Regular rate and rhythm, pedal pulses 2+ BL Pulm:  Normal effort Abd: firm, distended, hypoactive bowel sounds, some subjective tenderness but no obvious tenderness on exam - no guarding, no peritonitis. Urostomy productive of clear yellow urine.   NG - 700 cc thick bilious fluid/24h  Skin: warm and dry, no rashes  Psych: oriented to self only   Lab Results:  Recent Labs    11/29/2020 1830 11/13/20 0356  WBC 9.4 7.6  HGB 14.6 11.7*  HCT 46.1 35.8*  PLT 389 281   BMET Recent Labs    12/10/2020 1830 11/13/20 0356  NA 150* 151*  K 4.2 3.9  CL 111 119*  CO2 19* 17*  GLUCOSE 136* 119*  BUN 51* 52*  CREATININE 2.46* 2.08*  CALCIUM 9.0 8.2*   PT/INR No results for input(s): LABPROT, INR in the last 72 hours. CMP     Component Value Date/Time   NA 151 (H) 11/13/2020 0356   NA 145 03/08/2016 1356   K 3.9 11/13/2020 0356   K 4.5 03/08/2016 1356   CL 119 (H) 11/13/2020 0356   CO2 17 (L) 11/13/2020 0356   CO2 28 03/08/2016 1356    GLUCOSE 119 (H) 11/13/2020 0356   GLUCOSE 98 03/08/2016 1356   BUN 52 (H) 11/13/2020 0356   BUN 14.2 03/08/2016 1356   CREATININE 2.08 (H) 11/13/2020 0356   CREATININE 0.9 03/08/2016 1356   CALCIUM 8.2 (L) 11/13/2020 0356   CALCIUM 9.8 03/08/2016 1356   PROT 7.1 11/14/2020 1830   PROT 8.2 03/08/2016 1356   ALBUMIN 2.8 (L) 11/13/2020 0356   ALBUMIN 3.6 03/08/2016 1356   AST 22 11/18/2020 1830   AST 42 (H) 03/08/2016 1356   ALT 18 12/09/2020 1830   ALT 43 03/08/2016 1356   ALKPHOS 73 12/07/2020 1830   ALKPHOS 79 03/08/2016 1356   BILITOT 0.8 11/15/2020 1830   BILITOT 0.74 03/08/2016 1356   GFRNONAA 33 (L) 11/13/2020 0356   GFRAA >60 12/31/2019 0221   Lipase     Component Value Date/Time   LIPASE 57 (H) 11/18/2020 1830    Studies/Results: CT ABDOMEN PELVIS WO CONTRAST  Result Date: 11/13/2020 CLINICAL DATA:  Bowel obstruction. EXAM: CT ABDOMEN AND PELVIS WITHOUT CONTRAST TECHNIQUE: Multidetector CT imaging of the abdomen and pelvis was performed following the standard protocol without IV contrast. COMPARISON:  None. FINDINGS: Lower chest: Small bilateral pleural effusions and mild right lower lobe atelectasis. Hepatobiliary: Small cyst again noted in the left lobe. No mass visualized on  this unenhanced exam. Gallbladder is unremarkable. No evidence of biliary ductal dilatation. Pancreas: No mass or inflammatory process visualized on this unenhanced exam. Spleen:  Within normal limits in size. Adrenals/Urinary tract: No evidence of urolithiasis or hydronephrosis. Unremarkable unopacified urinary bladder. Stomach/Bowel: Nasogastric tube is seen with tip in the stomach. Markedly dilated small bowel loops with air-fluid levels are seen. There is a transition point seen in the right abdomen in the region of surgical staples with nondilated distal small bowel loops. This is suspicious for adhesion. Mild ascites and diffuse mesenteric edema is noted, however there is no evidence of focal  inflammatory process or abscess. Vascular/Lymphatic: No pathologically enlarged lymph nodes identified. No evidence of abdominal aortic aneurysm. Aortic atherosclerotic calcification noted. Reproductive: No mass or other significant abnormality. Penile prosthesis noted. Other:  None. Musculoskeletal:  No suspicious bone lesions identified. IMPRESSION: High-grade distal small bowel obstruction, possibly due to adhesion. Mild ascites and diffuse mesenteric edema. No evidence of focal inflammatory process or abscess. Small bilateral pleural effusions and mild right lower lobe atelectasis. Aortic Atherosclerosis (ICD10-I70.0). Electronically Signed   By: Marlaine Hind M.D.   On: 11/13/2020 15:23   DG Abd Portable 1V  Result Date: 11/13/2020 CLINICAL DATA:  Nasogastric tube placement EXAM: PORTABLE ABDOMEN - 1 VIEW COMPARISON:  November 13, 2020 study obtained earlier in the day FINDINGS: Nasogastric tube tip is in the stomach with the side port at the gastroesophageal junction. There are loops of mildly dilated bowel, similar to earlier in the day without appreciable air-fluid levels. No evident free air. Ostomy right lower quadrant noted. Surgical clips noted in the pelvis. Lung bases are clear. IMPRESSION: Nasogastric tube tip in stomach with side port at gastroesophageal junction. Advise advancing nasogastric tube 5-6 cm. Persistent bowel dilatation which may be indicative of a degree of ileus or bowel obstruction. No evident free air. Right lower quadrant ostomy noted. Electronically Signed   By: Lowella Grip III M.D.   On: 11/13/2020 08:57   DG Abd Portable 1V  Result Date: 11/13/2020 CLINICAL DATA:  Small-bowel obstruction, abdominal pain EXAM: PORTABLE ABDOMEN - 1 VIEW COMPARISON:  12/18/2010 FINDINGS: Multiple gas-filled loops of dilated bowel are seen throughout the mid abdomen. Ostomy appliance noted within the right mid abdomen. Together, the findings are suggestive of a distal small-bowel  obstruction. Numerous surgical clips noted within the pelvis bilaterally related to probable iliac lymph node dissection. No gross free intraperitoneal gas. No organomegaly. Osseous structures are age-appropriate. IMPRESSION: Small-bowel obstruction, new since prior examination. Right lower quadrant ostomy noted. Electronically Signed   By: Fidela Salisbury MD   On: 11/13/2020 06:56    Anti-infectives: Anti-infectives (From admission, onward)   None     Assessment/Plan Small Bowel Obstruction  - hemodynamically stable, WBC 6, lactate normalized 1.2 - CT abdomen/pelvis 12/2 w/  High grade SBO, mild ascites, and mesenteric edema.  - KUB this AM with persistent SBO, NG has been pulled back from the stomach. I re-advanced at bedside and ordered repeat KUB to confirm placement. - continue strict NPO, NG tube to LIWS - Small bowel protocol today with gastrografin pending NG in good position. Failure to improve in 24 hours may warrant exploratory laparotomy.  - this plan of care was discussed with the patients brother, [name], who plans to relay the plan to his sister, patient's daughter, as well as the patients girlfriend, whom the patient lives with.   PMH bladder cancer s/p cystectomy and ileal conduit  AKI on CKD - Creatinine 2.08  on admission. 1.54 today PMH CVA - hold plavix HTN Tobacco abuse   FEN: NPO, NG tube to LIWS  ID: None VTE: SCD's, ok for chemical VTE with Lovenox today Dispo: SDU, NPO   LOS: 2 days    Obie Dredge, [facility] Please see Amion for pager number during day hours 7:00am-4:30pm

## 2020-11-14 NOTE — Progress Notes (Signed)
PROGRESS NOTE    Ronald Wolf  LKG:[id number] DOB: 04/12/1946 DOA: 11/25/2020 PCP: Mosie Lukes, MD    Brief Narrative:  74 year old male with a previous history of stroke, hypertension, bladder cancer status post urostomy, seizure disorder, presented to the emergency room from [place] after he left [place] with no small bowel obstruction.  The patient complained of nausea, vomiting and abdominal pain.  Work-up in the emergency room revealed small bowel obstruction.  He was also noted to have lactic acidosis and acute kidney injury.  He was started on IV fluids, NG tube placed for decompression.  General surgery following.   Assessment & Plan:   Active Problems:   HTN (hypertension)   History of bladder cancer   Seizure (HCC)   History of stroke   SBO (small bowel obstruction) (HCC)   Hypernatremia   AKI (acute kidney injury) ([place])   Lactic acidosis   Small bowel obstruction -NG tube in place -Appreciate general surgery assistance -CT abdomen confirmed bowel obstruction -Small bowel protocol today -May need operative management if he does not show improvement -Keep n.p.o. for now  Acute kidney injury -Secondary to volume depletion -Continue on IV fluids -Monitor urine output -Creatinine 2.4 on admission -Improved to 1.5 with IV fluids  Hypernatremia -Secondary to free water deficit -Improving with hypotonic fluids  Lactic acidosis -Suspect this related to dehydration -Continue IV fluids -Lactic acid has trended down  Seizure disorder -Continue on Keppra  Hypertension -Chronically on amlodipine and Coreg -Continue IV Lopressor while n.p.o.  History of stroke -Chronically on Plavix -Resume once able  History of bladder cancer -Status post urostomy   DVT prophylaxis: SCDs, Lovenox  Code Status: Full code Family Communication: Updated patient's daughter 12/2 Disposition Plan: Status is: Inpatient  Remains inpatient appropriate  because:IV treatments appropriate due to intensity of illness or inability to take PO and Inpatient level of care appropriate due to severity of illness   Dispo:  Patient From: Home  Planned Disposition: Home  Expected discharge date: 11/17/20  Medically stable for discharge: No    Consultants:   General surgery  Procedures:     Antimicrobials:       Subjective: Patient has NG tube.  Denies any vomiting.  Denies any bowel movements.  No abdominal pain.  Objective: Vitals:   11/13/20 1436 11/13/20 1705 11/13/20 2115 11/14/20 0603  BP: (!) 173/78 (!) 170/83 (!) 158/78 (!) 150/76  Pulse: 74 77 (!) 102 79  Resp: 19 18 18 17   Temp: 97.7 F (36.5 C) 97.7 F (36.5 C) (!) 97.4 F (36.3 C) 97.6 F (36.4 C)  TempSrc: Oral Axillary Oral Oral  SpO2: 99% 100% 99% 100%  Weight:        Intake/Output Summary (Last 24 hours) at 11/14/2020 1205 Last data filed at 11/14/2020 0631 Gross per 24 hour  Intake 592.24 ml  Output 1300 ml  Net -707.76 ml   Filed Weights   11/13/20 0156  Weight: 57.6 kg    Examination:  General exam: Alert, awake, no distress, NG tube in place Respiratory system: Clear to auscultation. Respiratory effort normal. Cardiovascular system:RRR. No murmurs, rubs, gallops. Gastrointestinal system: Abdomen is distended, soft and nontender. No organomegaly or masses felt. Normal bowel sounds hypoactive.  Urostomy right lower quadrant Central nervous system: Alert and oriented. No focal neurological deficits. Extremities: No C/C/E, +pedal pulses Skin: No rashes, lesions or ulcers Psychiatry: Oriented to place and self, gets confused when you ask him about date  Data Reviewed: I have personally reviewed following labs and imaging studies  CBC: Recent Labs  Lab 11/13/2020 1830 11/13/20 0356 11/14/20 0647  WBC 9.4 7.6 6.4  NEUTROABS 7.2  --   --   HGB 14.6 11.7* 11.6*  HCT 46.1 35.8* 35.4*  MCV 108.5* 104.4* 105.4*  PLT 389 281 009   Basic  Metabolic Panel: Recent Labs  Lab 11/13/2020 1830 11/13/20 0356 11/14/20 0647  NA 150* 151* 146*  K 4.2 3.9 3.9  CL 111 119* 113*  CO2 19* 17* 24  GLUCOSE 136* 119* 125*  BUN 51* 52* 33*  CREATININE 2.46* 2.08* 1.54*  CALCIUM 9.0 8.2* 8.5*  MG  --  1.9  --   PHOS  --  2.6 2.4*   GFR: CrCl cannot be calculated (Unknown ideal weight.). Liver Function Tests: Recent Labs  Lab 12/10/2020 1830 11/13/20 0356 11/14/20 0647  AST 22  --   --   ALT 18  --   --   ALKPHOS 73  --   --   BILITOT 0.8  --   --   PROT 7.1  --   --   ALBUMIN 3.4* 2.8* 2.8*   Recent Labs  Lab 11/28/2020 1830  LIPASE 57*   No results for input(s): AMMONIA in the last 168 hours. Coagulation Profile: No results for input(s): INR, PROTIME in the last 168 hours. Cardiac Enzymes: No results for input(s): CKTOTAL, CKMB, CKMBINDEX, TROPONINI in the last 168 hours. BNP (last 3 results) No results for input(s): PROBNP in the last 8760 hours. HbA1C: No results for input(s): HGBA1C in the last 72 hours. CBG: No results for input(s): GLUCAP in the last 168 hours. Lipid Profile: No results for input(s): CHOL, HDL, LDLCALC, TRIG, CHOLHDL, LDLDIRECT in the last 72 hours. Thyroid Function Tests: No results for input(s): TSH, T4TOTAL, FREET4, T3FREE, THYROIDAB in the last 72 hours. Anemia Panel: No results for input(s): VITAMINB12, FOLATE, FERRITIN, TIBC, IRON, RETICCTPCT in the last 72 hours. Sepsis Labs: Recent Labs  Lab 11/13/20 0145 11/13/20 0356 11/13/20 0602 11/14/20 0647  LATICACIDVEN 3.0* 2.0* 2.0* 1.4    Recent Results (from the past 240 hour(s))  Resp Panel by RT-PCR (Flu A&B, Covid) Nasopharyngeal Swab     Status: None   Collection Time: 12/06/2020 10:46 PM   Specimen: Nasopharyngeal Swab; Nasopharyngeal(NP) swabs in vial transport medium  Result Value Ref Range Status   SARS Coronavirus 2 by RT PCR NEGATIVE NEGATIVE Final    Comment: (NOTE) SARS-CoV-2 target nucleic acids are NOT DETECTED.  The  SARS-CoV-2 RNA is generally detectable in upper respiratory specimens during the acute phase of infection. The lowest concentration of SARS-CoV-2 viral copies this assay can detect is 138 copies/mL. A negative result does not preclude SARS-Cov-2 infection and should not be used as the sole basis for treatment or other patient management decisions. A negative result may occur with  improper specimen collection/handling, submission of specimen other than nasopharyngeal swab, presence of viral mutation(s) within the areas targeted by this assay, and inadequate number of viral copies(<138 copies/mL). A negative result must be combined with clinical observations, patient history, and epidemiological information. The expected result is Negative.  Fact Sheet for Patients:  EntrepreneurPulse.com.au  Fact Sheet for Healthcare Providers:  IncredibleEmployment.be  This test is no t yet approved or cleared by the [place] FDA and  has been authorized for detection and/or diagnosis of SARS-CoV-2 by FDA under an Emergency Use Authorization (EUA). This EUA will remain  in effect (meaning  this test can be used) for the duration of the COVID-19 declaration under Section 564(b)(1) of the Act, 21 U.S.C.section 360bbb-3(b)(1), unless the authorization is terminated  or revoked sooner.       Influenza A by PCR NEGATIVE NEGATIVE Final   Influenza B by PCR NEGATIVE NEGATIVE Final    Comment: (NOTE) The Xpert Xpress SARS-CoV-2/FLU/RSV plus assay is intended as an aid in the diagnosis of influenza from Nasopharyngeal swab specimens and should not be used as a sole basis for treatment. Nasal washings and aspirates are unacceptable for Xpert Xpress SARS-CoV-2/FLU/RSV testing.  Fact Sheet for Patients: EntrepreneurPulse.com.au  Fact Sheet for Healthcare Providers: IncredibleEmployment.be  This test is not yet approved or  cleared by the [place] FDA and has been authorized for detection and/or diagnosis of SARS-CoV-2 by FDA under an Emergency Use Authorization (EUA). This EUA will remain in effect (meaning this test can be used) for the duration of the COVID-19 declaration under Section 564(b)(1) of the Act, 21 U.S.C. section 360bbb-3(b)(1), unless the authorization is terminated or revoked.  Performed at [facility], [place] [address] [place], [place] [address]          Radiology Studies: CT ABDOMEN PELVIS WO CONTRAST  Result Date: 11/13/2020 CLINICAL DATA:  Bowel obstruction. EXAM: CT ABDOMEN AND PELVIS WITHOUT CONTRAST TECHNIQUE: Multidetector CT imaging of the abdomen and pelvis was performed following the standard protocol without IV contrast. COMPARISON:  None. FINDINGS: Lower chest: Small bilateral pleural effusions and mild right lower lobe atelectasis. Hepatobiliary: Small cyst again noted in the left lobe. No mass visualized on this unenhanced exam. Gallbladder is unremarkable. No evidence of biliary ductal dilatation. Pancreas: No mass or inflammatory process visualized on this unenhanced exam. Spleen:  Within normal limits in size. Adrenals/Urinary tract: No evidence of urolithiasis or hydronephrosis. Unremarkable unopacified urinary bladder. Stomach/Bowel: Nasogastric tube is seen with tip in the stomach. Markedly dilated small bowel loops with air-fluid levels are seen. There is a transition point seen in the right abdomen in the region of surgical staples with nondilated distal small bowel loops. This is suspicious for adhesion. Mild ascites and diffuse mesenteric edema is noted, however there is no evidence of focal inflammatory process or abscess. Vascular/Lymphatic: No pathologically enlarged lymph nodes identified. No evidence of abdominal aortic aneurysm. Aortic atherosclerotic calcification noted. Reproductive: No mass or other significant abnormality. Penile prosthesis noted. Other:   None. Musculoskeletal:  No suspicious bone lesions identified. IMPRESSION: High-grade distal small bowel obstruction, possibly due to adhesion. Mild ascites and diffuse mesenteric edema. No evidence of focal inflammatory process or abscess. Small bilateral pleural effusions and mild right lower lobe atelectasis. Aortic Atherosclerosis (ICD10-I70.0). Electronically Signed   By: Marlaine Hind M.D.   On: 11/13/2020 15:23   DG Abd Portable 1V  Result Date: 11/14/2020 CLINICAL DATA:  Nasogastric tube with advancement EXAM: PORTABLE ABDOMEN - 1 VIEW COMPARISON:  Earlier today FINDINGS: Advanced enteric tube with tip and side port at the stomach. Small bowel obstruction which is known. Clear lung bases IMPRESSION: Well-positioned enteric tube after advancement. Electronically Signed   By: Monte Fantasia M.D.   On: 11/14/2020 09:22   DG Abd Portable 1V  Result Date: 11/14/2020 CLINICAL DATA:  Small-bowel obstruction. EXAM: PORTABLE ABDOMEN - 1 VIEW COMPARISON:  CT 11/13/2020.  Abdomen 11/13/2020. FINDINGS: Interim removal of NG tube. Persistent small-bowel distention. No interim change. Colon is nondilated. No free air. Hemidiaphragms incompletely imaged. Degenerative change lumbar spine and both hips. Aortoiliac atherosclerotic vascular calcification. Surgical  clips in the pelvis. Penile prosthesis noted. IMPRESSION: Interim removal of NG tube. Persistent small-bowel distention. No interim change. Colon is nondilated. Electronically Signed   By: Marcello Moores  Register   On: 11/14/2020 07:33   DG Abd Portable 1V  Result Date: 11/13/2020 CLINICAL DATA:  Nasogastric tube placement EXAM: PORTABLE ABDOMEN - 1 VIEW COMPARISON:  November 13, 2020 study obtained earlier in the day FINDINGS: Nasogastric tube tip is in the stomach with the side port at the gastroesophageal junction. There are loops of mildly dilated bowel, similar to earlier in the day without appreciable air-fluid levels. No evident free air. Ostomy right  lower quadrant noted. Surgical clips noted in the pelvis. Lung bases are clear. IMPRESSION: Nasogastric tube tip in stomach with side port at gastroesophageal junction. Advise advancing nasogastric tube 5-6 cm. Persistent bowel dilatation which may be indicative of a degree of ileus or bowel obstruction. No evident free air. Right lower quadrant ostomy noted. Electronically Signed   By: Lowella Grip III M.D.   On: 11/13/2020 08:57   DG Abd Portable 1V  Result Date: 11/13/2020 CLINICAL DATA:  Small-bowel obstruction, abdominal pain EXAM: PORTABLE ABDOMEN - 1 VIEW COMPARISON:  12/18/2010 FINDINGS: Multiple gas-filled loops of dilated bowel are seen throughout the mid abdomen. Ostomy appliance noted within the right mid abdomen. Together, the findings are suggestive of a distal small-bowel obstruction. Numerous surgical clips noted within the pelvis bilaterally related to probable iliac lymph node dissection. No gross free intraperitoneal gas. No organomegaly. Osseous structures are age-appropriate. IMPRESSION: Small-bowel obstruction, new since prior examination. Right lower quadrant ostomy noted. Electronically Signed   By: Fidela Salisbury MD   On: 11/13/2020 06:56        Scheduled Meds: . Chlorhexidine Gluconate Cloth  6 each Topical Daily  . enoxaparin (LOVENOX) injection  30 mg Subcutaneous Daily  . metoprolol tartrate  5 mg Intravenous Q8H  . sodium chloride flush  10-40 mL Intracatheter Q12H   Continuous Infusions: . dextrose 5 % with KCl 20 mEq / L 100 mL/hr at 11/14/20 0631  . levETIRAcetam 750 mg (11/14/20 1048)     LOS: 2 days    Time spent: 12mins   Kathie Dike, MD [facility]   If 7PM-7AM, please contact night-coverage [email or web address]  11/14/2020, 12:05 PM

## 2020-11-15 ENCOUNTER — Inpatient Hospital Stay (HOSPITAL_COMMUNITY): Payer: Medicare PPO

## 2020-11-15 LAB — RENAL FUNCTION PANEL
Albumin: 2.6 g/dL — ABNORMAL LOW (ref 3.5–5.0)
Anion gap: 11 (ref 5–15)
BUN: 28 mg/dL — ABNORMAL HIGH (ref 8–23)
CO2: 22 mmol/L (ref 22–32)
Calcium: 8.7 mg/dL — ABNORMAL LOW (ref 8.9–10.3)
Chloride: 109 mmol/L (ref 98–111)
Creatinine, Ser: 1.42 mg/dL — ABNORMAL HIGH (ref 0.61–1.24)
GFR, Estimated: 52 mL/min — ABNORMAL LOW (ref >60)
Glucose, Bld: 138 mg/dL — ABNORMAL HIGH (ref 70–99)
Phosphorus: 1.5 mg/dL — ABNORMAL LOW (ref 2.5–4.6)
Potassium: 3.7 mmol/L (ref 3.5–5.1)
Sodium: 142 mmol/L (ref 135–145)

## 2020-11-15 MED ORDER — HEPARIN SODIUM (PORCINE) 5000 UNIT/ML IJ SOLN
5000.0000 [IU] | Freq: Three times a day (TID) | INTRAMUSCULAR | Status: DC
  Administered 2020-11-16 – 2020-11-17 (×5): 5000 [IU] via SUBCUTANEOUS
  Filled 2020-11-15 (×7): qty 1

## 2020-11-15 NOTE — Progress Notes (Signed)
Pt's brother, Winfred Iiams, called this RN and inquiring regarding the phone call he received later this evening. Dr. Bobbye Morton made aware via [facility] txt page.

## 2020-11-15 NOTE — Progress Notes (Signed)
PROGRESS NOTE    IZIAH CATES  NWG:[id number] DOB: Jun 16, 1946 DOA: 12/10/2020 PCP: Mosie Lukes, MD    Brief Narrative:  74 year old male with a previous history of stroke, hypertension, bladder cancer status post urostomy, seizure disorder, presented to the emergency room from [place] after he left [place] with no small bowel obstruction.  The patient complained of nausea, vomiting and abdominal pain.  Work-up in the emergency room revealed small bowel obstruction.  He was also noted to have lactic acidosis and acute kidney injury.  He was started on IV fluids, NG tube placed for decompression.  General surgery following.   Assessment & Plan:   Active Problems:   HTN (hypertension)   History of bladder cancer   Seizure (HCC)   History of stroke   SBO (small bowel obstruction) (HCC)   Hypernatremia   AKI (acute kidney injury) ([facility])   Lactic acidosis   Small bowel obstruction -NG tube in place -Appreciate general surgery assistance -CT abdomen confirmed bowel obstruction -follow up abdominal xrays show persistent sbo -May need operative management if he does not show improvement -Keep n.p.o. for now  Acute kidney injury -Secondary to volume depletion -Continue on IV fluids -Monitor urine output -Creatinine 2.4 on admission -Improved to 1.4 with IV fluids  Hypernatremia -Secondary to free water deficit -resolved with hypotonic fluids  Lactic acidosis -related to dehydration -Continue IV fluids -Lactic acid has trended down  Seizure disorder -Continue on Keppra  Hypertension -Chronically on amlodipine and Coreg -Continue IV Lopressor while n.p.o.  History of stroke -Chronically on Plavix -Resume once able  History of bladder cancer -Status post urostomy  Confusion -Patient's daughter reports that he has had periods of confusion over the last several months -He no longer drives and she manages all of his bills -I suspect that he  may have some signs of early cognitive impairment -These may be more pronounced while he is in the hospital   DVT prophylaxis: SCDs, Lovenox  Code Status: Full code Family Communication: Updated patient's daughter 12/4.  She also gave information for patient's brother Daltin Crist [phone] Disposition Plan: Status is: Inpatient  Remains inpatient appropriate because:IV treatments appropriate due to intensity of illness or inability to take PO and Inpatient level of care appropriate due to severity of illness   Dispo:  Patient From: Home  Planned Disposition: Home  Expected discharge date: 11/17/20  Medically stable for discharge: No    Consultants:   General surgery  Procedures:     Antimicrobials:       Subjective: Patient continues with NG tube for decompression.  He has about 700 cc of fluid in canister.  No bowel movement.  Says he is hungry.  Objective: Vitals:   11/14/20 1701 11/14/20 2111 11/15/20 0447 11/15/20 1540  BP: (!) 144/91 (!) 142/80 140/78 119/89  Pulse: 85 91 94 100  Resp: 18 16  19   Temp: 98.2 F (36.8 C) 98.1 F (36.7 C) 97.8 F (36.6 C) 98.6 F (37 C)  TempSrc: Oral Oral  Oral  SpO2: 100% 99% 99% 100%  Weight:      Height:        Intake/Output Summary (Last 24 hours) at 11/15/2020 1718 Last data filed at 11/15/2020 1541 Gross per 24 hour  Intake 905.99 ml  Output 2950 ml  Net -2044.01 ml   Filed Weights   11/13/20 0156 11/14/20 1400  Weight: 57.6 kg 60.8 kg    Examination:  General exam: Alert, awake, NG  tube in place Respiratory system: Clear to auscultation. Respiratory effort normal. Cardiovascular system:RRR. No murmurs, rubs, gallops. Gastrointestinal system: Abdomen is nondistended, soft and nontender. No organomegaly or masses felt. Normal bowel sounds heard.  Urostomy right lower quadrant Central nervous system: No focal neurological deficits. Extremities: No C/C/E, +pedal pulses Skin: No rashes, lesions or  ulcers Psychiatry: Appears to be mildly confused.       Data Reviewed: I have personally reviewed following labs and imaging studies  CBC: Recent Labs  Lab 11/16/2020 1830 11/13/20 0356 11/14/20 0647  WBC 9.4 7.6 6.4  NEUTROABS 7.2  --   --   HGB 14.6 11.7* 11.6*  HCT 46.1 35.8* 35.4*  MCV 108.5* 104.4* 105.4*  PLT 389 281 950   Basic Metabolic Panel: Recent Labs  Lab 11/29/2020 1830 11/13/20 0356 11/14/20 0647 11/15/20 0314  NA 150* 151* 146* 142  K 4.2 3.9 3.9 3.7  CL 111 119* 113* 109  CO2 19* 17* 24 22  GLUCOSE 136* 119* 125* 138*  BUN 51* 52* 33* 28*  CREATININE 2.46* 2.08* 1.54* 1.42*  CALCIUM 9.0 8.2* 8.5* 8.7*  MG  --  1.9  --   --   PHOS  --  2.6 2.4* 1.5*   GFR: Estimated Creatinine Clearance: 39.2 mL/min (A) (by C-G formula based on SCr of 1.42 mg/dL (H)). Liver Function Tests: Recent Labs  Lab 11/25/2020 1830 11/13/20 0356 11/14/20 0647 11/15/20 0314  AST 22  --   --   --   ALT 18  --   --   --   ALKPHOS 73  --   --   --   BILITOT 0.8  --   --   --   PROT 7.1  --   --   --   ALBUMIN 3.4* 2.8* 2.8* 2.6*   Recent Labs  Lab 11/21/2020 1830  LIPASE 57*   No results for input(s): AMMONIA in the last 168 hours. Coagulation Profile: No results for input(s): INR, PROTIME in the last 168 hours. Cardiac Enzymes: No results for input(s): CKTOTAL, CKMB, CKMBINDEX, TROPONINI in the last 168 hours. BNP (last 3 results) No results for input(s): PROBNP in the last 8760 hours. HbA1C: No results for input(s): HGBA1C in the last 72 hours. CBG: No results for input(s): GLUCAP in the last 168 hours. Lipid Profile: No results for input(s): CHOL, HDL, LDLCALC, TRIG, CHOLHDL, LDLDIRECT in the last 72 hours. Thyroid Function Tests: No results for input(s): TSH, T4TOTAL, FREET4, T3FREE, THYROIDAB in the last 72 hours. Anemia Panel: No results for input(s): VITAMINB12, FOLATE, FERRITIN, TIBC, IRON, RETICCTPCT in the last 72 hours. Sepsis Labs: Recent Labs  Lab  11/13/20 0145 11/13/20 0356 11/13/20 0602 11/14/20 0647  LATICACIDVEN 3.0* 2.0* 2.0* 1.4    Recent Results (from the past 240 hour(s))  Resp Panel by RT-PCR (Flu A&B, Covid) Nasopharyngeal Swab     Status: None   Collection Time: 12/12/2020 10:46 PM   Specimen: Nasopharyngeal Swab; Nasopharyngeal(NP) swabs in vial transport medium  Result Value Ref Range Status   SARS Coronavirus 2 by RT PCR NEGATIVE NEGATIVE Final    Comment: (NOTE) SARS-CoV-2 target nucleic acids are NOT DETECTED.  The SARS-CoV-2 RNA is generally detectable in upper respiratory specimens during the acute phase of infection. The lowest concentration of SARS-CoV-2 viral copies this assay can detect is 138 copies/mL. A negative result does not preclude SARS-Cov-2 infection and should not be used as the sole basis for treatment or other patient management decisions. A  negative result may occur with  improper specimen collection/handling, submission of specimen other than nasopharyngeal swab, presence of viral mutation(s) within the areas targeted by this assay, and inadequate number of viral copies(<138 copies/mL). A negative result must be combined with clinical observations, patient history, and epidemiological information. The expected result is Negative.  Fact Sheet for Patients:  EntrepreneurPulse.com.au  Fact Sheet for Healthcare Providers:  IncredibleEmployment.be  This test is no t yet approved or cleared by the [place] FDA and  has been authorized for detection and/or diagnosis of SARS-CoV-2 by FDA under an Emergency Use Authorization (EUA). This EUA will remain  in effect (meaning this test can be used) for the duration of the COVID-19 declaration under Section 564(b)(1) of the Act, 21 U.S.C.section 360bbb-3(b)(1), unless the authorization is terminated  or revoked sooner.       Influenza A by PCR NEGATIVE NEGATIVE Final   Influenza B by PCR NEGATIVE  NEGATIVE Final    Comment: (NOTE) The Xpert Xpress SARS-CoV-2/FLU/RSV plus assay is intended as an aid in the diagnosis of influenza from Nasopharyngeal swab specimens and should not be used as a sole basis for treatment. Nasal washings and aspirates are unacceptable for Xpert Xpress SARS-CoV-2/FLU/RSV testing.  Fact Sheet for Patients: EntrepreneurPulse.com.au  Fact Sheet for Healthcare Providers: IncredibleEmployment.be  This test is not yet approved or cleared by the [place] FDA and has been authorized for detection and/or diagnosis of SARS-CoV-2 by FDA under an Emergency Use Authorization (EUA). This EUA will remain in effect (meaning this test can be used) for the duration of the COVID-19 declaration under Section 564(b)(1) of the Act, 21 U.S.C. section 360bbb-3(b)(1), unless the authorization is terminated or revoked.  Performed at [facility], [place] [address], [place], [place] [address]          Radiology Studies: DG Abd 2 Views  Result Date: 11/15/2020 CLINICAL DATA:  Abdominal distension, history of small-bowel obstruction EXAM: ABDOMEN - 2 VIEW COMPARISON:  11/15/2011 FINDINGS: Gastric catheter is again noted within the stomach. Scattered large and small bowel gas is noted. Persistent small bowel dilatation is noted. Ostomy is noted in the right mid abdomen. Degenerative changes of lumbar spine are seen. IMPRESSION: Persistent small-bowel obstruction. Electronically Signed   By: Inez Catalina M.D.   On: 11/15/2020 08:07   DG Abd Portable 1V  Result Date: 11/15/2020 CLINICAL DATA:  Follow up small bowel obstruction EXAM: PORTABLE ABDOMEN - 1 VIEW COMPARISON:  Film from earlier in the same day. FINDINGS: Scattered large and small bowel gas is noted. Spur system small-bowel dilatation is again seen similar to the film from 1 hour previous. Gastric catheter appears to been removed in the interval. IMPRESSION: Persistent small  bowel dilatation. Electronically Signed   By: Inez Catalina M.D.   On: 11/15/2020 10:02   DG Abd Portable 1V-Small Bowel Obstruction Protocol-initial, 8 hr delay  Result Date: 11/14/2020 CLINICAL DATA:  Small-bowel obstruction EXAM: PORTABLE ABDOMEN - 1 VIEW COMPARISON:  November 14, 2020 FINDINGS: Dilated loops of small bowel are again noted in the abdomen. These appear essentially unchanged from prior study. There is no definite pneumatosis or free air. There are degenerative changes of the spine and hips. IMPRESSION: No significant interval change in the appearance of the small bowel. Findings are consistent with persistent small bowel obstruction. Electronically Signed   By: Constance Holster M.D.   On: 11/14/2020 21:30   DG Abd Portable 1V  Result Date: 11/14/2020 CLINICAL DATA:  Nasogastric tube  with advancement EXAM: PORTABLE ABDOMEN - 1 VIEW COMPARISON:  Earlier today FINDINGS: Advanced enteric tube with tip and side port at the stomach. Small bowel obstruction which is known. Clear lung bases IMPRESSION: Well-positioned enteric tube after advancement. Electronically Signed   By: Monte Fantasia M.D.   On: 11/14/2020 09:22   DG Abd Portable 1V  Result Date: 11/14/2020 CLINICAL DATA:  Small-bowel obstruction. EXAM: PORTABLE ABDOMEN - 1 VIEW COMPARISON:  CT 11/13/2020.  Abdomen 11/13/2020. FINDINGS: Interim removal of NG tube. Persistent small-bowel distention. No interim change. Colon is nondilated. No free air. Hemidiaphragms incompletely imaged. Degenerative change lumbar spine and both hips. Aortoiliac atherosclerotic vascular calcification. Surgical clips in the pelvis. Penile prosthesis noted. IMPRESSION: Interim removal of NG tube. Persistent small-bowel distention. No interim change. Colon is nondilated. Electronically Signed   By: [place]   On: 11/14/2020 07:33        Scheduled Meds: . chlorhexidine  15 mL Mouth Rinse BID  . Chlorhexidine Gluconate Cloth  6 each Topical  Daily  . mouth rinse  15 mL Mouth Rinse q12n4p  . metoprolol tartrate  5 mg Intravenous Q8H  . sodium chloride flush  10-40 mL Intracatheter Q12H   Continuous Infusions: . dextrose 5 % with KCl 20 mEq / L 20 mEq (11/15/20 1103)  . levETIRAcetam 750 mg (11/15/20 0911)     LOS: 3 days    Time spent: 51mins   Kathie Dike, MD [facility]   If 7PM-7AM, please contact night-coverage [email or web address]  11/15/2020, 5:18 PM

## 2020-11-15 NOTE — Progress Notes (Signed)
Subjective/Chief Complaint: Complains of abd being uncomfortable. No flatus yet   Objective: Vital signs in last 24 hours: Temp:  [97.8 F (36.6 C)-98.2 F (36.8 C)] 97.8 F (36.6 C) (12/04 0447) Pulse Rate:  [85-94] 94 (12/04 0447) Resp:  [16-18] 16 (12/03 2111) BP: (140-144)/(78-91) 140/78 (12/04 0447) SpO2:  [99 %-100 %] 99 % (12/04 0447) Weight:  [60.8 kg] 60.8 kg (12/03 1400)    Intake/Output from previous day: 12/03 0701 - 12/04 0700 In: 1790.2 [I.V.:1574.2; IV Piggyback:216] Out: 2400 [Urine:1250; Emesis/NG output:1150] Intake/Output this shift: Total I/O In: 906 [I.V.:798; IV Piggyback:108] Out: 1050 [Urine:550; Emesis/NG output:500]  General appearance: alert and cooperative Resp: clear to auscultation bilaterally Cardio: regular rate and rhythm GI: soft, minimal tenderness. distended  Lab Results:  Recent Labs    11/13/20 0356 11/14/20 0647  WBC 7.6 6.4  HGB 11.7* 11.6*  HCT 35.8* 35.4*  PLT 281 293   BMET Recent Labs    11/14/20 0647 11/15/20 0314  NA 146* 142  K 3.9 3.7  CL 113* 109  CO2 24 22  GLUCOSE 125* 138*  BUN 33* 28*  CREATININE 1.54* 1.42*  CALCIUM 8.5* 8.7*   PT/INR No results for input(s): LABPROT, INR in the last 72 hours. ABG No results for input(s): PHART, HCO3 in the last 72 hours.  Invalid input(s): PCO2, PO2  Studies/Results: CT ABDOMEN PELVIS WO CONTRAST  Result Date: 11/13/2020 CLINICAL DATA:  Bowel obstruction. EXAM: CT ABDOMEN AND PELVIS WITHOUT CONTRAST TECHNIQUE: Multidetector CT imaging of the abdomen and pelvis was performed following the standard protocol without IV contrast. COMPARISON:  None. FINDINGS: Lower chest: Small bilateral pleural effusions and mild right lower lobe atelectasis. Hepatobiliary: Small cyst again noted in the left lobe. No mass visualized on this unenhanced exam. Gallbladder is unremarkable. No evidence of biliary ductal dilatation. Pancreas: No mass or inflammatory process visualized  on this unenhanced exam. Spleen:  Within normal limits in size. Adrenals/Urinary tract: No evidence of urolithiasis or hydronephrosis. Unremarkable unopacified urinary bladder. Stomach/Bowel: Nasogastric tube is seen with tip in the stomach. Markedly dilated small bowel loops with air-fluid levels are seen. There is a transition point seen in the right abdomen in the region of surgical staples with nondilated distal small bowel loops. This is suspicious for adhesion. Mild ascites and diffuse mesenteric edema is noted, however there is no evidence of focal inflammatory process or abscess. Vascular/Lymphatic: No pathologically enlarged lymph nodes identified. No evidence of abdominal aortic aneurysm. Aortic atherosclerotic calcification noted. Reproductive: No mass or other significant abnormality. Penile prosthesis noted. Other:  None. Musculoskeletal:  No suspicious bone lesions identified. IMPRESSION: High-grade distal small bowel obstruction, possibly due to adhesion. Mild ascites and diffuse mesenteric edema. No evidence of focal inflammatory process or abscess. Small bilateral pleural effusions and mild right lower lobe atelectasis. Aortic Atherosclerosis (ICD10-I70.0). Electronically Signed   By: Marlaine Hind M.D.   On: 11/13/2020 15:23   DG Abd Portable 1V-Small Bowel Obstruction Protocol-initial, 8 hr delay  Result Date: 11/14/2020 CLINICAL DATA:  Small-bowel obstruction EXAM: PORTABLE ABDOMEN - 1 VIEW COMPARISON:  November 14, 2020 FINDINGS: Dilated loops of small bowel are again noted in the abdomen. These appear essentially unchanged from prior study. There is no definite pneumatosis or free air. There are degenerative changes of the spine and hips. IMPRESSION: No significant interval change in the appearance of the small bowel. Findings are consistent with persistent small bowel obstruction. Electronically Signed   By: Constance Holster M.D.   On: 11/14/2020  21:30   DG Abd Portable 1V  Result  Date: 11/14/2020 CLINICAL DATA:  Nasogastric tube with advancement EXAM: PORTABLE ABDOMEN - 1 VIEW COMPARISON:  Earlier today FINDINGS: Advanced enteric tube with tip and side port at the stomach. Small bowel obstruction which is known. Clear lung bases IMPRESSION: Well-positioned enteric tube after advancement. Electronically Signed   By: Monte Fantasia M.D.   On: 11/14/2020 09:22   DG Abd Portable 1V  Result Date: 11/14/2020 CLINICAL DATA:  Small-bowel obstruction. EXAM: PORTABLE ABDOMEN - 1 VIEW COMPARISON:  CT 11/13/2020.  Abdomen 11/13/2020. FINDINGS: Interim removal of NG tube. Persistent small-bowel distention. No interim change. Colon is nondilated. No free air. Hemidiaphragms incompletely imaged. Degenerative change lumbar spine and both hips. Aortoiliac atherosclerotic vascular calcification. Surgical clips in the pelvis. Penile prosthesis noted. IMPRESSION: Interim removal of NG tube. Persistent small-bowel distention. No interim change. Colon is nondilated. Electronically Signed   By: Marcello Moores  Register   On: 11/14/2020 07:33   DG Abd Portable 1V  Result Date: 11/13/2020 CLINICAL DATA:  Nasogastric tube placement EXAM: PORTABLE ABDOMEN - 1 VIEW COMPARISON:  November 13, 2020 study obtained earlier in the day FINDINGS: Nasogastric tube tip is in the stomach with the side port at the gastroesophageal junction. There are loops of mildly dilated bowel, similar to earlier in the day without appreciable air-fluid levels. No evident free air. Ostomy right lower quadrant noted. Surgical clips noted in the pelvis. Lung bases are clear. IMPRESSION: Nasogastric tube tip in stomach with side port at gastroesophageal junction. Advise advancing nasogastric tube 5-6 cm. Persistent bowel dilatation which may be indicative of a degree of ileus or bowel obstruction. No evident free air. Right lower quadrant ostomy noted. Electronically Signed   By: Lowella Grip III M.D.   On: 11/13/2020 08:57     Anti-infectives: Anti-infectives (From admission, onward)   None      Assessment/Plan: s/p * No surgery found * continue ng and bowel rest  Repeat abd xrays today. If he does not improve then he may need surgery soon  LOS: 3 days    Autumn Messing III 11/15/2020

## 2020-11-15 NOTE — Progress Notes (Signed)
Patient seen and evaluated after reviewing XR imaging suggestive of persistent SBO. Discussed with patient the indication for surgery, to which he responded "no." I offered him the option of continued non-operative management with NGT, to which he responded "no." I explained these were the only two options and offered the option to call a family member to assist in the discussion/decision-making. He stated he wanted me to call his brother. There is no number listed in the chart for a brother and the patient is unable to provide a contact number. In trying to identify who would be the legal NOK, I asked if he had a wife, to which he responded yes, but was unable to provide a name or phone number. There is a daughter listed in the chart and I asked him if it would be okay to call his daughter. He did not respond at all to this question, so I called her, but was unable to reach her and left her a voicemail.   Jesusita Oka, MD General and [facility]

## 2020-11-15 NOTE — Progress Notes (Signed)
Notified by primary team, Roderic Palau, MD, of return phone call from patient's daughter and phone number obtained for patient's brother. Daughter affirms the number in the chart is the best way to reach her. Called this number again and also [phone], number provided for Wyatt Portela. No answer at either number, left voicemail messages for both.   Jesusita Oka, MD General and [facility] [place]

## 2020-11-16 DIAGNOSIS — R4189 Other symptoms and signs involving cognitive functions and awareness: Secondary | ICD-10-CM

## 2020-11-16 LAB — RENAL FUNCTION PANEL
Albumin: 2.6 g/dL — ABNORMAL LOW (ref 3.5–5.0)
Anion gap: 11 (ref 5–15)
BUN: 26 mg/dL — ABNORMAL HIGH (ref 8–23)
CO2: 22 mmol/L (ref 22–32)
Calcium: 8.6 mg/dL — ABNORMAL LOW (ref 8.9–10.3)
Chloride: 104 mmol/L (ref 98–111)
Creatinine, Ser: 1.47 mg/dL — ABNORMAL HIGH (ref 0.61–1.24)
GFR, Estimated: 50 mL/min — ABNORMAL LOW (ref >60)
Glucose, Bld: 134 mg/dL — ABNORMAL HIGH (ref 70–99)
Phosphorus: 1.8 mg/dL — ABNORMAL LOW (ref 2.5–4.6)
Potassium: 4.3 mmol/L (ref 3.5–5.1)
Sodium: 137 mmol/L (ref 135–145)

## 2020-11-16 NOTE — Progress Notes (Signed)
PROGRESS NOTE  Ronald Wolf:[id number] DOB: Aug 14, 1946   PCP: Mosie Lukes, MD  Patient is from: [facility]  DOA: 12/02/2020 LOS: 4  Chief complaints: Nausea, vomiting and abdominal pain  Brief Narrative / Interim history: 74 year old male with history of cognitive impairment, CVA, HTN, bladder cancer now with urostomy, right-sided glottic carcinoma s/p excision brought to ED with nausea, vomiting and abdominal pain.  Work-up in ED revealed small bowel obstruction.  He also had AKI and lactic acidosis.  Started on bowel rest with NG tube decompression and IV fluid.  General surgery following.  Subsequent x-rays with persistent SBO. Plan is for surgery   Subjective: Seen and examined earlier this morning.  No major events overnight or this morning.  Reports abdominal pain that he rates as 9/10.  However, he is not a reliable historian.  He is only oriented to self and place but not time or situation.  Objective: Vitals:   11/16/20 0516 11/16/20 0649 11/16/20 1003 11/16/20 1425  BP:  (!) 158/77 (!) 165/94 (!) 167/89  Pulse:  78 88 98  Resp:  18 18 19   Temp:  97.8 F (36.6 C) 100 F (37.8 C) 97.6 F (36.4 C)  TempSrc:   Axillary Oral  SpO2:  100% 100% 100%  Weight: 61.6 kg     Height:        Intake/Output Summary (Last 24 hours) at 11/16/2020 1431 Last data filed at 11/16/2020 1200 Gross per 24 hour  Intake 3202.17 ml  Output 2200 ml  Net 1002.17 ml   Filed Weights   11/13/20 0156 11/14/20 1400 11/16/20 0516  Weight: 57.6 kg 60.8 kg 61.6 kg    Examination:  GENERAL: No apparent distress.  Nontoxic. HEENT: MMM.  Vision and hearing grossly intact.  NECK: Supple.  No apparent JVD.  RESP: On RA.  No IWOB.  Fair aeration bilaterally. CVS:  RRR. Heart sounds normal.  ABD/GI/GU: BS+. Abd soft, NTND.  Urostomy bag over RUQ. MSK/EXT:  Moves extremities.  Significant muscle mass and subcu fat loss. SKIN: no apparent skin lesion or wound NEURO: Awake and alert.   Oriented to self and place.  No apparent focal neuro deficit. PSYCH: Calm. Normal affect.   Procedures:  None  Microbiology summarized: [id number] and influenza PCR nonreactive.  Assessment & Plan: Small bowel obstruction-noted on CT abdomen and pelvis.  Persistent despite NG tube decompression  -Plan for surgery per general surgery. -Continue bowel rest and IV fluid  Cognitive impairment: Possible underlying dementia although no formal diagnosis.  Only oriented to self and place.  Per family,periods of confusion over the last several months.  No longer drives or minus BX. -Reorientation and delirium precautions  Acute kidney injury/azotemia: Cr 2.4 on admit>> 1.47.  Baseline 1.0-1.2.  Likely prerenal in the setting of dehydration from GI loss-nausea and vomiting. -Continue IV fluid  Lactic acidosis: Likely from dehydration.  Resolved with IV fluid.  Hypernatremia: Likely due to her dehydration.  Resolved. -Continue IV fluid  Seizure disorder: Stable -Continue on Keppra  Hypertension: Normotensive for most part.  On amlodipine and Coreg at home. -Continue IV Lopressor while n.p.o.  History of stroke: On Plavix at home. -Plavix on hold  History of bladder cancer s/p urostomy-seems to have good urine output. -Monitor urine output  History of glottic cancer status post resection  Severe malnutrition Body mass index is 18.94 kg/m.  -We will consult dietitian       DVT prophylaxis:  heparin injection 5,000 Units Start:  11/15/20 2200  Code Status: Full code Family Communication: Patient and/or RN. Available if any question.  Status is: Inpatient  Remains inpatient appropriate because:Ongoing diagnostic testing needed not appropriate for outpatient work up, IV treatments appropriate due to intensity of illness or inability to take PO and Inpatient level of care appropriate due to severity of illness   Dispo:  Patient From: Home  Planned Disposition:  Home  Expected discharge date: 11/17/20  Medically stable for discharge: No        Consultants:  General surgery   Sch Meds:  Scheduled Meds: . chlorhexidine  15 mL Mouth Rinse BID  . Chlorhexidine Gluconate Cloth  6 each Topical Daily  . heparin injection (subcutaneous)  5,000 Units Subcutaneous Q8H  . mouth rinse  15 mL Mouth Rinse q12n4p  . metoprolol tartrate  5 mg Intravenous Q8H  . sodium chloride flush  10-40 mL Intracatheter Q12H   Continuous Infusions: . dextrose 5 % with KCl 20 mEq / L 100 mL/hr at 11/16/20 0640  . levETIRAcetam 750 mg (11/16/20 1059)   PRN Meds:.acetaminophen, hydrALAZINE, morphine injection, ondansetron (ZOFRAN) IV, sodium chloride flush, sodium chloride flush  Antimicrobials: Anti-infectives (From admission, onward)   None       I have personally reviewed the following labs and images: CBC: Recent Labs  Lab 11/26/2020 1830 11/13/20 0356 11/14/20 0647  WBC 9.4 7.6 6.4  NEUTROABS 7.2  --   --   HGB 14.6 11.7* 11.6*  HCT 46.1 35.8* 35.4*  MCV 108.5* 104.4* 105.4*  PLT 389 281 293   BMP &GFR Recent Labs  Lab 12/09/2020 1830 11/13/20 0356 11/14/20 0647 11/15/20 0314 11/16/20 0309  NA 150* 151* 146* 142 137  K 4.2 3.9 3.9 3.7 4.3  CL 111 119* 113* 109 104  CO2 19* 17* 24 22 22   GLUCOSE 136* 119* 125* 138* 134*  BUN 51* 52* 33* 28* 26*  CREATININE 2.46* 2.08* 1.54* 1.42* 1.47*  CALCIUM 9.0 8.2* 8.5* 8.7* 8.6*  MG  --  1.9  --   --   --   PHOS  --  2.6 2.4* 1.5* 1.8*   Estimated Creatinine Clearance: 38.4 mL/min (A) (by C-G formula based on SCr of 1.47 mg/dL (H)). Liver & Pancreas: Recent Labs  Lab 12/02/2020 1830 11/13/20 0356 11/14/20 0647 11/15/20 0314 11/16/20 0309  AST 22  --   --   --   --   ALT 18  --   --   --   --   ALKPHOS 73  --   --   --   --   BILITOT 0.8  --   --   --   --   PROT 7.1  --   --   --   --   ALBUMIN 3.4* 2.8* 2.8* 2.6* 2.6*   Recent Labs  Lab 12/02/2020 1830  LIPASE 57*   No results for  input(s): AMMONIA in the last 168 hours. Diabetic: No results for input(s): HGBA1C in the last 72 hours. No results for input(s): GLUCAP in the last 168 hours. Cardiac Enzymes: No results for input(s): CKTOTAL, CKMB, CKMBINDEX, TROPONINI in the last 168 hours. No results for input(s): PROBNP in the last 8760 hours. Coagulation Profile: No results for input(s): INR, PROTIME in the last 168 hours. Thyroid Function Tests: No results for input(s): TSH, T4TOTAL, FREET4, T3FREE, THYROIDAB in the last 72 hours. Lipid Profile: No results for input(s): CHOL, HDL, LDLCALC, TRIG, CHOLHDL, LDLDIRECT in the last 72 hours. Anemia  Panel: No results for input(s): VITAMINB12, FOLATE, FERRITIN, TIBC, IRON, RETICCTPCT in the last 72 hours. Urine analysis:    Component Value Date/Time   COLORURINE YELLOW 12/10/2020 2102   APPEARANCEUR HAZY (A) 12/09/2020 2102   LABSPEC 1.013 11/28/2020 2102   PHURINE 7.0 11/30/2020 2102   GLUCOSEU NEGATIVE 12/07/2020 2102   GLUCOSEU NEGATIVE 08/21/2015 1412   HGBUR NEGATIVE 11/28/2020 2102   BILIRUBINUR NEGATIVE 12/07/2020 2102   BILIRUBINUR negative 12/20/2017 1846   KETONESUR NEGATIVE 11/15/2020 2102   PROTEINUR 100 (A) 11/30/2020 2102   UROBILINOGEN 0.2 12/20/2017 1846   UROBILINOGEN 0.2 08/21/2015 1412   NITRITE NEGATIVE 11/22/2020 2102   LEUKOCYTESUR NEGATIVE 12/03/2020 2102   Sepsis Labs: Invalid input(s): PROCALCITONIN, [place]  Microbiology: Recent Results (from the past 240 hour(s))  Resp Panel by RT-PCR (Flu A&B, Covid) Nasopharyngeal Swab     Status: None   Collection Time: 11/24/2020 10:46 PM   Specimen: Nasopharyngeal Swab; Nasopharyngeal(NP) swabs in vial transport medium  Result Value Ref Range Status   SARS Coronavirus 2 by RT PCR NEGATIVE NEGATIVE Final    Comment: (NOTE) SARS-CoV-2 target nucleic acids are NOT DETECTED.  The SARS-CoV-2 RNA is generally detectable in upper respiratory specimens during the acute phase of infection. The  lowest concentration of SARS-CoV-2 viral copies this assay can detect is 138 copies/mL. A negative result does not preclude SARS-Cov-2 infection and should not be used as the sole basis for treatment or other patient management decisions. A negative result may occur with  improper specimen collection/handling, submission of specimen other than nasopharyngeal swab, presence of viral mutation(s) within the areas targeted by this assay, and inadequate number of viral copies(<138 copies/mL). A negative result must be combined with clinical observations, patient history, and epidemiological information. The expected result is Negative.  Fact Sheet for Patients:  EntrepreneurPulse.com.au  Fact Sheet for Healthcare Providers:  IncredibleEmployment.be  This test is no t yet approved or cleared by the [place] FDA and  has been authorized for detection and/or diagnosis of SARS-CoV-2 by FDA under an Emergency Use Authorization (EUA). This EUA will remain  in effect (meaning this test can be used) for the duration of the COVID-19 declaration under Section 564(b)(1) of the Act, 21 U.S.C.section 360bbb-3(b)(1), unless the authorization is terminated  or revoked sooner.       Influenza A by PCR NEGATIVE NEGATIVE Final   Influenza B by PCR NEGATIVE NEGATIVE Final    Comment: (NOTE) The Xpert Xpress SARS-CoV-2/FLU/RSV plus assay is intended as an aid in the diagnosis of influenza from Nasopharyngeal swab specimens and should not be used as a sole basis for treatment. Nasal washings and aspirates are unacceptable for Xpert Xpress SARS-CoV-2/FLU/RSV testing.  Fact Sheet for Patients: EntrepreneurPulse.com.au  Fact Sheet for Healthcare Providers: IncredibleEmployment.be  This test is not yet approved or cleared by the [place] FDA and has been authorized for detection and/or diagnosis of SARS-CoV-2 by FDA under  an Emergency Use Authorization (EUA). This EUA will remain in effect (meaning this test can be used) for the duration of the COVID-19 declaration under Section 564(b)(1) of the Act, 21 U.S.C. section 360bbb-3(b)(1), unless the authorization is terminated or revoked.  Performed at [facility], [place] [address], [place],  [address]     Radiology Studies: No results found.    Everson Mott T. Rea  If 7PM-7AM, please contact night-coverage [email or web address] 11/16/2020, 2:31 PM

## 2020-11-16 NOTE — Plan of Care (Signed)

## 2020-11-16 NOTE — Progress Notes (Signed)
Subjective/Chief Complaint: No complaints. No flatus or bm   Objective: Vital signs in last 24 hours: Temp:  [97.8 F (36.6 C)-98.6 F (37 C)] 97.8 F (36.6 C) (12/05 0649) Pulse Rate:  [78-100] 78 (12/05 0649) Resp:  [18-20] 18 (12/05 0649) BP: (119-179)/(77-94) 158/77 (12/05 0649) SpO2:  [99 %-100 %] 100 % (12/05 0649) Weight:  [61.6 kg] 61.6 kg (12/05 0516)    Intake/Output from previous day: 12/04 0701 - 12/05 0700 In: 3202.2 [I.V.:2986.2; IV Piggyback:216] Out: 1850 [Urine:1250; Emesis/NG output:600] Intake/Output this shift: No intake/output data recorded.  General appearance: alert and cooperative Resp: clear to auscultation bilaterally Cardio: regular rate and rhythm GI: soft, distended  Lab Results:  Recent Labs    11/14/20 0647  WBC 6.4  HGB 11.6*  HCT 35.4*  PLT 293   BMET Recent Labs    11/15/20 0314 11/16/20 0309  NA 142 137  K 3.7 4.3  CL 109 104  CO2 22 22  GLUCOSE 138* 134*  BUN 28* 26*  CREATININE 1.42* 1.47*  CALCIUM 8.7* 8.6*   PT/INR No results for input(s): LABPROT, INR in the last 72 hours. ABG No results for input(s): PHART, HCO3 in the last 72 hours.  Invalid input(s): PCO2, PO2  Studies/Results: DG Abd 2 Views  Result Date: 11/15/2020 CLINICAL DATA:  Abdominal distension, history of small-bowel obstruction EXAM: ABDOMEN - 2 VIEW COMPARISON:  11/15/2011 FINDINGS: Gastric catheter is again noted within the stomach. Scattered large and small bowel gas is noted. Persistent small bowel dilatation is noted. Ostomy is noted in the right mid abdomen. Degenerative changes of lumbar spine are seen. IMPRESSION: Persistent small-bowel obstruction. Electronically Signed   By: Inez Catalina M.D.   On: 11/15/2020 08:07   DG Abd Portable 1V  Result Date: 11/15/2020 CLINICAL DATA:  Follow up small bowel obstruction EXAM: PORTABLE ABDOMEN - 1 VIEW COMPARISON:  Film from earlier in the same day. FINDINGS: Scattered large and small bowel gas  is noted. Spur system small-bowel dilatation is again seen similar to the film from 1 hour previous. Gastric catheter appears to been removed in the interval. IMPRESSION: Persistent small bowel dilatation. Electronically Signed   By: Inez Catalina M.D.   On: 11/15/2020 10:02   DG Abd Portable 1V-Small Bowel Obstruction Protocol-initial, 8 hr delay  Result Date: 11/14/2020 CLINICAL DATA:  Small-bowel obstruction EXAM: PORTABLE ABDOMEN - 1 VIEW COMPARISON:  November 14, 2020 FINDINGS: Dilated loops of small bowel are again noted in the abdomen. These appear essentially unchanged from prior study. There is no definite pneumatosis or free air. There are degenerative changes of the spine and hips. IMPRESSION: No significant interval change in the appearance of the small bowel. Findings are consistent with persistent small bowel obstruction. Electronically Signed   By: Constance Holster M.D.   On: 11/14/2020 21:30   DG Abd Portable 1V  Result Date: 11/14/2020 CLINICAL DATA:  Nasogastric tube with advancement EXAM: PORTABLE ABDOMEN - 1 VIEW COMPARISON:  Earlier today FINDINGS: Advanced enteric tube with tip and side port at the stomach. Small bowel obstruction which is known. Clear lung bases IMPRESSION: Well-positioned enteric tube after advancement. Electronically Signed   By: Monte Fantasia M.D.   On: 11/14/2020 09:22    Anti-infectives: Anti-infectives (From admission, onward)   None      Assessment/Plan: s/p * No surgery found * Continue ng and bowel rest  SBO does not seem to be improved. Pt does not want surgery but does not seem to be  oriented to time, place, or situation. I was able to reach daughter who is agreeable to surgery if he needs it. Risks and benefits of surgery discussed with family and she understands and wishes to proceed. Will discuss with primary team regarding timing of surgery.  LOS: 4 days    Autumn Messing III 11/16/2020

## 2020-11-17 DIAGNOSIS — E871 Hypo-osmolality and hyponatremia: Secondary | ICD-10-CM

## 2020-11-17 LAB — COMPREHENSIVE METABOLIC PANEL
ALT: 15 U/L (ref 0–44)
AST: 17 U/L (ref 15–41)
Albumin: 2.7 g/dL — ABNORMAL LOW (ref 3.5–5.0)
Alkaline Phosphatase: 46 U/L (ref 38–126)
Anion gap: 10 (ref 5–15)
BUN: 20 mg/dL (ref 8–23)
CO2: 24 mmol/L (ref 22–32)
Calcium: 8.4 mg/dL — ABNORMAL LOW (ref 8.9–10.3)
Chloride: 100 mmol/L (ref 98–111)
Creatinine, Ser: 1.33 mg/dL — ABNORMAL HIGH (ref 0.61–1.24)
GFR, Estimated: 56 mL/min — ABNORMAL LOW (ref >60)
Glucose, Bld: 133 mg/dL — ABNORMAL HIGH (ref 70–99)
Potassium: 4.1 mmol/L (ref 3.5–5.1)
Sodium: 134 mmol/L — ABNORMAL LOW (ref 135–145)
Total Bilirubin: 1.2 mg/dL (ref 0.3–1.2)
Total Protein: 5.6 g/dL — ABNORMAL LOW (ref 6.5–8.1)

## 2020-11-17 LAB — PHOSPHORUS: Phosphorus: 1.8 mg/dL — ABNORMAL LOW (ref 2.5–4.6)

## 2020-11-17 LAB — CBC
HCT: 35.3 % — ABNORMAL LOW (ref 39.0–52.0)
Hemoglobin: 11.5 g/dL — ABNORMAL LOW (ref 13.0–17.0)
MCH: 33.1 pg (ref 26.0–34.0)
MCHC: 32.6 g/dL (ref 30.0–36.0)
MCV: 101.7 fL — ABNORMAL HIGH (ref 80.0–100.0)
Platelets: 302 10*3/uL (ref 150–400)
RBC: 3.47 MIL/uL — ABNORMAL LOW (ref 4.22–5.81)
RDW: 11.9 % (ref 11.5–15.5)
WBC: 7.9 10*3/uL (ref 4.0–10.5)
nRBC: 0.3 % — ABNORMAL HIGH (ref 0.0–0.2)

## 2020-11-17 LAB — MAGNESIUM: Magnesium: 1.8 mg/dL (ref 1.7–2.4)

## 2020-11-17 MED ORDER — DEXTROSE-NACL 5-0.9 % IV SOLN: INTRAVENOUS | Status: DC

## 2020-11-17 MED ORDER — METOPROLOL TARTRATE 5 MG/5ML IV SOLN
5.0000 mg | Freq: Four times a day (QID) | INTRAVENOUS | Status: DC
  Administered 2020-11-17 – 2020-11-19 (×6): 5 mg via INTRAVENOUS
  Filled 2020-11-17 (×6): qty 5

## 2020-11-17 MED ORDER — POTASSIUM PHOSPHATES 15 MMOLE/5ML IV SOLN
30.0000 mmol | Freq: Once | INTRAVENOUS | Status: AC
  Administered 2020-11-17: 30 mmol via INTRAVENOUS
  Filled 2020-11-17: qty 10

## 2020-11-17 NOTE — Progress Notes (Signed)
PROGRESS NOTE  Ronald Wolf ERX:[id number] DOB: [birth year]   PCP: Mosie Lukes, MD  Patient is from: [facility]  DOA: 11/26/2020 LOS: 5  Chief complaints: Nausea, vomiting and abdominal pain  Brief Narrative / Interim history: 74 year old male with history of cognitive impairment, CVA, HTN, bladder cancer now with urostomy, right-sided glottic carcinoma s/p excision brought to ED with nausea, vomiting and abdominal pain.  Work-up in ED revealed small bowel obstruction.  He also had AKI and lactic acidosis.  Started on bowel rest with NG tube decompression and IV fluid.  General surgery following.  Subsequent x-rays with persistent SBO. Plan is for surgery on 12/7.   Subjective: Seen and examined earlier this morning.  No major events overnight of this morning.  No complaints but not a reliable historian.  He is awake and alert but only oriented to self.  Does not appear to be in pain or distress.  Objective: Vitals:   11/16/20 2041 11/17/20 0500 11/17/20 0552 11/17/20 1246  BP: (!) 156/86  (!) 170/97 (!) 158/87  Pulse: 95  85 98  Resp: 17  17 18   Temp: 97.9 F (36.6 C)  97.8 F (36.6 C) 98.3 F (36.8 C)  TempSrc: Oral   Axillary  SpO2: 99%  100% 98%  Weight:  61.2 kg    Height:        Intake/Output Summary (Last 24 hours) at 11/17/2020 1337 Last data filed at 11/17/2020 1246 Gross per 24 hour  Intake 916.25 ml  Output 650 ml  Net 266.25 ml   Filed Weights   11/14/20 1400 11/16/20 0516 11/17/20 0500  Weight: 60.8 kg 61.6 kg 61.2 kg    Examination:  GENERAL: No apparent distress.  Nontoxic. HEENT: MMM.  NGT in place. NECK: Supple.  No apparent JVD.  RESP:  No IWOB.  Fair aeration bilaterally. CVS:  RRR. Heart sounds normal.  ABD/GI/GU: BS+.  Abdomen is slightly distended.  No significant tenderness. MSK/EXT:  Moves extremities. No apparent deformity. No edema.  SKIN: no apparent skin lesion or wound NEURO: Awake and alert.  Oriented only to self.  No  apparent focal neuro deficit. PSYCH: Calm. Normal affect.  Procedures:  None  Microbiology summarized: [id number] and influenza PCR nonreactive.  Assessment & Plan: Small bowel obstruction-noted on CT abdomen and pelvis.  Persistent despite NG tube decompression  -Plan for surgery on 12/7. -Continue bowel rest and IV fluid  Cognitive impairment: Possible underlying dementia although no formal diagnosis.  Oriented only to self.  Per family, periods of confusion over the last several months.  No longer drives or manages his bills -Reorientation and delirium precautions  Acute kidney injury/azotemia: Cr 2.4 on admit>> 1.47> 1.33.  Baseline 1.0-1.2.  Likely prerenal in the setting of dehydration from GI loss-nausea and vomiting. -Continue IV fluid-change to NS in the setting of hyponatremia  Hypernatremia: Likely due to her dehydration.  Resolved. -Continue IV fluid  Hyponatremia: Na 133. -Change D5 to NS  Hypophosphatemia: P1.8. -Replenish with IV K-Phos 20 mmol.  Lactic acidosis: Likely from dehydration.  Resolved with IV fluid.  Seizure disorder: Stable -Continue on Keppra  Hypertension: Normotensive for most part.  On amlodipine and Coreg at home. -Continue IV Lopressor 5 mg while n.p.o. increased from 3 times daily to 4 times daily  History of stroke: On Plavix at home. -Plavix on hold  History of bladder cancer s/p urostomy-seems to have good urine output. -Monitor urine output  History of glottic cancer status post resection  Severe malnutrition Body mass index is 18.82 kg/m.  -We will consult dietitian       DVT prophylaxis:  heparin injection 5,000 Units Start: 11/15/20 2200  Code Status: Full code Family Communication: Patient and/or RN. Available if any question.  Status is: Inpatient  Remains inpatient appropriate because:Ongoing diagnostic testing needed not appropriate for outpatient work up, IV treatments appropriate due to intensity of  illness or inability to take PO and Inpatient level of care appropriate due to severity of illness   Dispo:  Patient From: Home  Planned Disposition: Home  Expected discharge date: 11/19/20  Medically stable for discharge: No        Consultants:  General surgery   Sch Meds:  Scheduled Meds: . chlorhexidine  15 mL Mouth Rinse BID  . Chlorhexidine Gluconate Cloth  6 each Topical Daily  . heparin injection (subcutaneous)  5,000 Units Subcutaneous Q8H  . mouth rinse  15 mL Mouth Rinse q12n4p  . metoprolol tartrate  5 mg Intravenous Q8H  . sodium chloride flush  10-40 mL Intracatheter Q12H   Continuous Infusions: . dextrose 5 % and 0.9% NaCl 100 mL/hr at 11/17/20 0842  . levETIRAcetam 750 mg (11/17/20 1045)  . potassium PHOSPHATE IVPB (in mmol) 30 mmol (11/17/20 0848)   PRN Meds:.acetaminophen, hydrALAZINE, morphine injection, ondansetron (ZOFRAN) IV, sodium chloride flush, sodium chloride flush  Antimicrobials: Anti-infectives (From admission, onward)   None       I have personally reviewed the following labs and images: CBC: Recent Labs  Lab 11/25/2020 1830 11/13/20 0356 11/14/20 0647 11/17/20 0431  WBC 9.4 7.6 6.4 7.9  NEUTROABS 7.2  --   --   --   HGB 14.6 11.7* 11.6* 11.5*  HCT 46.1 35.8* 35.4* 35.3*  MCV 108.5* 104.4* 105.4* 101.7*  PLT 389 281 293 302   BMP &GFR Recent Labs  Lab 11/13/20 0356 11/14/20 0647 11/15/20 0314 11/16/20 0309 11/17/20 0431  NA 151* 146* 142 137 134*  K 3.9 3.9 3.7 4.3 4.1  CL 119* 113* 109 104 100  CO2 17* 24 22 22 24   GLUCOSE 119* 125* 138* 134* 133*  BUN 52* 33* 28* 26* 20  CREATININE 2.08* 1.54* 1.42* 1.47* 1.33*  CALCIUM 8.2* 8.5* 8.7* 8.6* 8.4*  MG 1.9  --   --   --  1.8  PHOS 2.6 2.4* 1.5* 1.8* 1.8*   Estimated Creatinine Clearance: 42.2 mL/min (A) (by C-G formula based on SCr of 1.33 mg/dL (H)). Liver & Pancreas: Recent Labs  Lab 12/01/2020 1830 12/07/2020 1830 11/13/20 0356 11/14/20 0647 11/15/20 0314  11/16/20 0309 11/17/20 0431  AST 22  --   --   --   --   --  17  ALT 18  --   --   --   --   --  15  ALKPHOS 73  --   --   --   --   --  46  BILITOT 0.8  --   --   --   --   --  1.2  PROT 7.1  --   --   --   --   --  5.6*  ALBUMIN 3.4*   < > 2.8* 2.8* 2.6* 2.6* 2.7*   < > = values in this interval not displayed.   Recent Labs  Lab 11/18/2020 1830  LIPASE 57*   No results for input(s): AMMONIA in the last 168 hours. Diabetic: No results for input(s): HGBA1C in the last 72 hours. No results  for input(s): GLUCAP in the last 168 hours. Cardiac Enzymes: No results for input(s): CKTOTAL, CKMB, CKMBINDEX, TROPONINI in the last 168 hours. No results for input(s): PROBNP in the last 8760 hours. Coagulation Profile: No results for input(s): INR, PROTIME in the last 168 hours. Thyroid Function Tests: No results for input(s): TSH, T4TOTAL, FREET4, T3FREE, THYROIDAB in the last 72 hours. Lipid Profile: No results for input(s): CHOL, HDL, LDLCALC, TRIG, CHOLHDL, LDLDIRECT in the last 72 hours. Anemia Panel: No results for input(s): VITAMINB12, FOLATE, FERRITIN, TIBC, IRON, RETICCTPCT in the last 72 hours. Urine analysis:    Component Value Date/Time   COLORURINE YELLOW 12/10/2020 2102   APPEARANCEUR HAZY (A) 11/26/2020 2102   LABSPEC 1.013 11/28/2020 2102   PHURINE 7.0 12/05/2020 2102   GLUCOSEU NEGATIVE 11/16/2020 2102   GLUCOSEU NEGATIVE 08/21/2015 1412   HGBUR NEGATIVE 11/29/2020 2102   BILIRUBINUR NEGATIVE 12/09/2020 2102   BILIRUBINUR negative 12/20/2017 1846   KETONESUR NEGATIVE 12/12/2020 2102   PROTEINUR 100 (A) 12/01/2020 2102   UROBILINOGEN 0.2 12/20/2017 1846   UROBILINOGEN 0.2 08/21/2015 1412   NITRITE NEGATIVE 12/09/2020 2102   LEUKOCYTESUR NEGATIVE 12/08/2020 2102   Sepsis Labs: Invalid input(s): PROCALCITONIN, [place]  Microbiology: Recent Results (from the past 240 hour(s))  Resp Panel by RT-PCR (Flu A&B, Covid) Nasopharyngeal Swab     Status: None    Collection Time: 11/22/2020 10:46 PM   Specimen: Nasopharyngeal Swab; Nasopharyngeal(NP) swabs in vial transport medium  Result Value Ref Range Status   SARS Coronavirus 2 by RT PCR NEGATIVE NEGATIVE Final    Comment: (NOTE) SARS-CoV-2 target nucleic acids are NOT DETECTED.  The SARS-CoV-2 RNA is generally detectable in upper respiratory specimens during the acute phase of infection. The lowest concentration of SARS-CoV-2 viral copies this assay can detect is 138 copies/mL. A negative result does not preclude SARS-Cov-2 infection and should not be used as the sole basis for treatment or other patient management decisions. A negative result may occur with  improper specimen collection/handling, submission of specimen other than nasopharyngeal swab, presence of viral mutation(s) within the areas targeted by this assay, and inadequate number of viral copies(<138 copies/mL). A negative result must be combined with clinical observations, patient history, and epidemiological information. The expected result is Negative.  Fact Sheet for Patients:  EntrepreneurPulse.com.au  Fact Sheet for Healthcare Providers:  IncredibleEmployment.be  This test is no t yet approved or cleared by the [place] FDA and  has been authorized for detection and/or diagnosis of SARS-CoV-2 by FDA under an Emergency Use Authorization (EUA). This EUA will remain  in effect (meaning this test can be used) for the duration of the COVID-19 declaration under Section 564(b)(1) of the Act, 21 U.S.C.section 360bbb-3(b)(1), unless the authorization is terminated  or revoked sooner.       Influenza A by PCR NEGATIVE NEGATIVE Final   Influenza B by PCR NEGATIVE NEGATIVE Final    Comment: (NOTE) The Xpert Xpress SARS-CoV-2/FLU/RSV plus assay is intended as an aid in the diagnosis of influenza from Nasopharyngeal swab specimens and should not be used as a sole basis for treatment.  Nasal washings and aspirates are unacceptable for Xpert Xpress SARS-CoV-2/FLU/RSV testing.  Fact Sheet for Patients: EntrepreneurPulse.com.au  Fact Sheet for Healthcare Providers: IncredibleEmployment.be  This test is not yet approved or cleared by the [place] FDA and has been authorized for detection and/or diagnosis of SARS-CoV-2 by FDA under an Emergency Use Authorization (EUA). This EUA will remain in effect (meaning this test can be  used) for the duration of the COVID-19 declaration under Section 564(b)(1) of the Act, 21 U.S.C. section 360bbb-3(b)(1), unless the authorization is terminated or revoked.  Performed at [facility], [place] [address] [place], [place] [address]     Radiology Studies: No results found.    Ronald Wolf  If 7PM-7AM, please contact night-coverage [email or web address] 11/17/2020, 1:37 PM

## 2020-11-17 NOTE — Progress Notes (Signed)
Subjective: Patient confused today.  Oriented only to self.  Says he is passing flatus, but he is unreliable.  No BMs.  NGT still with significant output.  ROS: See above, otherwise other systems negative  Objective: Vital signs in last 24 hours: Temp:  [97.6 F (36.4 C)-97.9 F (36.6 C)] 97.8 F (36.6 C) (12/06 0552) Pulse Rate:  [85-98] 85 (12/06 0552) Resp:  [17-19] 17 (12/06 0552) BP: (156-170)/(86-97) 170/97 (12/06 0552) SpO2:  [99 %-100 %] 100 % (12/06 0552) Weight:  [61.2 kg] 61.2 kg (12/06 0500)    Intake/Output from previous day: 12/05 0701 - 12/06 0700 In: 916.3 [I.V.:808.3; IV Piggyback:108] Out: 1000 [Emesis/NG output:1000] Intake/Output this shift: No intake/output data recorded.  PE: Heart: regular Lungs: CTAB Abd: distended, tympanitic, hypoactive BS, mildly tender, urostomy in RLQ with good urine output.  NGT still with copious bilious output Psych: alert, oriented only to person otherwise confused  Lab Results:  Recent Labs    11/17/20 0431  WBC 7.9  HGB 11.5*  HCT 35.3*  PLT 302   BMET Recent Labs    11/16/20 0309 11/17/20 0431  NA 137 134*  K 4.3 4.1  CL 104 100  CO2 22 24  GLUCOSE 134* 133*  BUN 26* 20  CREATININE 1.47* 1.33*  CALCIUM 8.6* 8.4*   PT/INR No results for input(s): LABPROT, INR in the last 72 hours. CMP     Component Value Date/Time   NA 134 (L) 11/17/2020 0431   NA 145 03/08/2016 1356   K 4.1 11/17/2020 0431   K 4.5 03/08/2016 1356   CL 100 11/17/2020 0431   CO2 24 11/17/2020 0431   CO2 28 03/08/2016 1356   GLUCOSE 133 (H) 11/17/2020 0431   GLUCOSE 98 03/08/2016 1356   BUN 20 11/17/2020 0431   BUN 14.2 03/08/2016 1356   CREATININE 1.33 (H) 11/17/2020 0431   CREATININE 0.9 03/08/2016 1356   CALCIUM 8.4 (L) 11/17/2020 0431   CALCIUM 9.8 03/08/2016 1356   PROT 5.6 (L) 11/17/2020 0431   PROT 8.2 03/08/2016 1356   ALBUMIN 2.7 (L) 11/17/2020 0431   ALBUMIN 3.6 03/08/2016 1356   AST 17 11/17/2020 0431    AST 42 (H) 03/08/2016 1356   ALT 15 11/17/2020 0431   ALT 43 03/08/2016 1356   ALKPHOS 46 11/17/2020 0431   ALKPHOS 79 03/08/2016 1356   BILITOT 1.2 11/17/2020 0431   BILITOT 0.74 03/08/2016 1356   GFRNONAA 56 (L) 11/17/2020 0431   GFRAA >60 12/31/2019 0221   Lipase     Component Value Date/Time   LIPASE 57 (H) 11/15/2020 1830       Studies/Results: No results found.  Anti-infectives: Anti-infectives (From admission, onward)   None       Assessment/Plan Small Bowel Obstruction  - patient continues to be obstructed with no evidence of improvement. -discussed with daughter, [name], today who is agreeable to proceed with surgical intervention.  I discussed the procedure, risks, complications, possible outcomes, need for SNF/rehab, etc.  We also discussed the need for bowel resection or issues with the urostomy, but we wouldn't know the extent until we get in there.  She understands and agrees to proceed. -will plan for OR tomorrow -cont NPO/NGT for now.  PMH bladder cancer s/p cystectomy and ileal conduit AKI on CKD - Creatinine 2.08 on admission. 1.54 today PMH CVA - hold plavix HTN Tobacco abuse   FEN: NPO, NG tube to LIWS  ID: None VTE: SCD's,  heparin Dispo: SDU, NPO   LOS: 5 days    Henreitta Cea , [facility] Surgery 11/17/2020, 11:11 AM Please see Amion for pager number during day hours 7:00am-4:30pm or 7:00am -11:30am on weekends

## 2020-11-17 NOTE — Progress Notes (Signed)
PT Cancellation Note  Patient Details Name: Ronald Wolf MRN: [id number] DOB: December 26, 1945   Cancelled Treatment:    Reason Eval/Treat Not Completed: Fatigue/lethargy limiting ability to participate. Pt awoke with name/touch. Shaking head and mumbling about being "too tired". Will follow up as time allows vs another date. Acute PT to continue during pt's hospital stay.   Willow Ora, PTA, CLT Acute Rehab Services [phone] 11/17/20, 12:41 PM   Willow Ora 11/17/2020, 12:40 PM

## 2020-11-18 ENCOUNTER — Inpatient Hospital Stay: Payer: Self-pay

## 2020-11-18 ENCOUNTER — Inpatient Hospital Stay (HOSPITAL_COMMUNITY): Payer: Medicare PPO | Admitting: Anesthesiology

## 2020-11-18 ENCOUNTER — Inpatient Hospital Stay (HOSPITAL_COMMUNITY): Payer: Medicare PPO

## 2020-11-18 ENCOUNTER — Encounter (HOSPITAL_COMMUNITY): Admission: EM | Disposition: E | Payer: Self-pay | Source: Home / Self Care | Attending: Pulmonary Disease

## 2020-11-18 ENCOUNTER — Encounter (HOSPITAL_COMMUNITY): Payer: Self-pay | Admitting: Internal Medicine

## 2020-11-18 DIAGNOSIS — K56609 Unspecified intestinal obstruction, unspecified as to partial versus complete obstruction: Secondary | ICD-10-CM | POA: Diagnosis not present

## 2020-11-18 DIAGNOSIS — J9601 Acute respiratory failure with hypoxia: Secondary | ICD-10-CM

## 2020-11-18 HISTORY — PX: GASTROSTOMY: SHX5249

## 2020-11-18 HISTORY — PX: BOWEL RESECTION: SHX1257

## 2020-11-18 HISTORY — PX: LYSIS OF ADHESION: SHX5961

## 2020-11-18 HISTORY — PX: LAPAROTOMY: SHX154

## 2020-11-18 LAB — SURGICAL PCR SCREEN
MRSA, PCR: NEGATIVE
Staphylococcus aureus: NEGATIVE

## 2020-11-18 LAB — BODY FLUID CELL COUNT WITH DIFFERENTIAL
Eos, Fluid: 2 %
Lymphs, Fluid: 10 %
Monocyte-Macrophage-Serous Fluid: 8 % — ABNORMAL LOW (ref 50–90)
Neutrophil Count, Fluid: 80 % — ABNORMAL HIGH (ref 0–25)
Total Nucleated Cell Count, Fluid: 560 cu mm (ref 0–1000)

## 2020-11-18 LAB — ABO/RH: ABO/RH(D): B POS

## 2020-11-18 LAB — POCT I-STAT 7, (LYTES, BLD GAS, ICA,H+H)
Acid-base deficit: 2 mmol/L (ref 0.0–2.0)
Bicarbonate: 21.7 mmol/L (ref 20.0–28.0)
Calcium, Ion: 1.05 mmol/L — ABNORMAL LOW (ref 1.15–1.40)
HCT: 35 % — ABNORMAL LOW (ref 39.0–52.0)
Hemoglobin: 11.9 g/dL — ABNORMAL LOW (ref 13.0–17.0)
O2 Saturation: 100 %
Potassium: 3.3 mmol/L — ABNORMAL LOW (ref 3.5–5.1)
Sodium: 142 mmol/L (ref 135–145)
TCO2: 23 mmol/L (ref 22–32)
pCO2 arterial: 32.9 mmHg (ref 32.0–48.0)
pH, Arterial: 7.428 (ref 7.350–7.450)
pO2, Arterial: 408 mmHg — ABNORMAL HIGH (ref 83.0–108.0)

## 2020-11-18 LAB — BASIC METABOLIC PANEL
Anion gap: 11 (ref 5–15)
BUN: 17 mg/dL (ref 8–23)
CO2: 18 mmol/L — ABNORMAL LOW (ref 22–32)
Calcium: 7.5 mg/dL — ABNORMAL LOW (ref 8.9–10.3)
Chloride: 110 mmol/L (ref 98–111)
Creatinine, Ser: 1.31 mg/dL — ABNORMAL HIGH (ref 0.61–1.24)
GFR, Estimated: 57 mL/min — ABNORMAL LOW (ref >60)
Glucose, Bld: 128 mg/dL — ABNORMAL HIGH (ref 70–99)
Potassium: 3.4 mmol/L — ABNORMAL LOW (ref 3.5–5.1)
Sodium: 139 mmol/L (ref 135–145)

## 2020-11-18 LAB — BRAIN NATRIURETIC PEPTIDE: B Natriuretic Peptide: 79.5 pg/mL (ref 0.0–100.0)

## 2020-11-18 LAB — RENAL FUNCTION PANEL
Albumin: 2.5 g/dL — ABNORMAL LOW (ref 3.5–5.0)
Anion gap: 10 (ref 5–15)
BUN: 14 mg/dL (ref 8–23)
CO2: 25 mmol/L (ref 22–32)
Calcium: 8.1 mg/dL — ABNORMAL LOW (ref 8.9–10.3)
Chloride: 106 mmol/L (ref 98–111)
Creatinine, Ser: 1.24 mg/dL (ref 0.61–1.24)
GFR, Estimated: >60 mL/min (ref >60)
Glucose, Bld: 131 mg/dL — ABNORMAL HIGH (ref 70–99)
Phosphorus: 2.8 mg/dL (ref 2.5–4.6)
Potassium: 3.9 mmol/L (ref 3.5–5.1)
Sodium: 141 mmol/L (ref 135–145)

## 2020-11-18 LAB — RETICULOCYTES
Immature Retic Fract: 10.8 % (ref 2.3–15.9)
RBC.: 3.33 MIL/uL — ABNORMAL LOW (ref 4.22–5.81)
Retic Count, Absolute: 50 10*3/uL (ref 19.0–186.0)
Retic Ct Pct: 1.5 % (ref 0.4–3.1)

## 2020-11-18 LAB — PREPARE RBC (CROSSMATCH)

## 2020-11-18 LAB — LACTIC ACID, PLASMA
Lactic Acid, Venous: 2.6 mmol/L (ref 0.5–1.9)
Lactic Acid, Venous: 3.1 mmol/L (ref 0.5–1.9)

## 2020-11-18 LAB — IRON AND TIBC
Iron: 11 ug/dL — ABNORMAL LOW (ref 45–182)
Saturation Ratios: 6 % — ABNORMAL LOW (ref 17.9–39.5)
TIBC: 185 ug/dL — ABNORMAL LOW (ref 250–450)
UIBC: 174 ug/dL

## 2020-11-18 LAB — TROPONIN I (HIGH SENSITIVITY)
Troponin I (High Sensitivity): 54 ng/L — ABNORMAL HIGH (ref <18)
Troponin I (High Sensitivity): 49 ng/L — ABNORMAL HIGH (ref <18)

## 2020-11-18 LAB — CBC
HCT: 42.6 % (ref 39.0–52.0)
Hemoglobin: 14.1 g/dL (ref 13.0–17.0)
MCH: 31.3 pg (ref 26.0–34.0)
MCHC: 33.1 g/dL (ref 30.0–36.0)
MCV: 94.7 fL (ref 80.0–100.0)
Platelets: 204 10*3/uL (ref 150–400)
RBC: 4.5 MIL/uL (ref 4.22–5.81)
RDW: 14.9 % (ref 11.5–15.5)
WBC: 9.3 10*3/uL (ref 4.0–10.5)
nRBC: 0.2 % (ref 0.0–0.2)

## 2020-11-18 LAB — FERRITIN: Ferritin: 727 ng/mL — ABNORMAL HIGH (ref 24–336)

## 2020-11-18 LAB — FOLATE: Folate: 13 ng/mL (ref >5.9)

## 2020-11-18 LAB — VITAMIN B12: Vitamin B-12: 831 pg/mL (ref 180–914)

## 2020-11-18 LAB — MAGNESIUM: Magnesium: 1.8 mg/dL (ref 1.7–2.4)

## 2020-11-18 LAB — HEMOGLOBIN AND HEMATOCRIT, BLOOD
HCT: 33.1 % — ABNORMAL LOW (ref 39.0–52.0)
Hemoglobin: 11 g/dL — ABNORMAL LOW (ref 13.0–17.0)

## 2020-11-18 SURGERY — LAPAROTOMY, EXPLORATORY
Anesthesia: General

## 2020-11-18 MED ORDER — HEPARIN SODIUM (PORCINE) 5000 UNIT/ML IJ SOLN
5000.0000 [IU] | Freq: Three times a day (TID) | INTRAMUSCULAR | Status: DC
  Administered 2020-11-19 – 2020-11-23 (×12): 5000 [IU] via SUBCUTANEOUS
  Filled 2020-11-18 (×10): qty 1

## 2020-11-18 MED ORDER — PROPOFOL 1000 MG/100ML IV EMUL
0.0000 ug/kg/min | INTRAVENOUS | Status: DC
  Administered 2020-11-18: 40 ug/kg/min via INTRAVENOUS
  Filled 2020-11-18 (×2): qty 100

## 2020-11-18 MED ORDER — CEFAZOLIN SODIUM-DEXTROSE 2-3 GM-%(50ML) IV SOLR
INTRAVENOUS | Status: DC | PRN
  Administered 2020-11-18: 2 g via INTRAVENOUS

## 2020-11-18 MED ORDER — PHENYLEPHRINE 40 MCG/ML (10ML) SYRINGE FOR IV PUSH (FOR BLOOD PRESSURE SUPPORT)
PREFILLED_SYRINGE | INTRAVENOUS | Status: DC | PRN
  Administered 2020-11-18 (×2): 80 ug via INTRAVENOUS

## 2020-11-18 MED ORDER — LACTATED RINGERS IV SOLN: INTRAVENOUS | Status: DC

## 2020-11-18 MED ORDER — PHENYLEPHRINE 40 MCG/ML (10ML) SYRINGE FOR IV PUSH (FOR BLOOD PRESSURE SUPPORT)
PREFILLED_SYRINGE | INTRAVENOUS | Status: AC
  Filled 2020-11-18: qty 10

## 2020-11-18 MED ORDER — LACTATED RINGERS IV BOLUS
500.0000 mL | Freq: Once | INTRAVENOUS | Status: AC
  Administered 2020-11-18: 500 mL via INTRAVENOUS

## 2020-11-18 MED ORDER — LIDOCAINE HCL (PF) 2 % IJ SOLN
INTRAMUSCULAR | Status: AC
  Filled 2020-11-18: qty 15

## 2020-11-18 MED ORDER — LIDOCAINE 2% (20 MG/ML) 5 ML SYRINGE
INTRAMUSCULAR | Status: DC | PRN
  Administered 2020-11-18: 40 mg via INTRAVENOUS

## 2020-11-18 MED ORDER — ALBUMIN HUMAN 5 % IV SOLN: INTRAVENOUS | Status: DC | PRN

## 2020-11-18 MED ORDER — SODIUM CHLORIDE 0.9% FLUSH
10.0000 mL | Freq: Two times a day (BID) | INTRAVENOUS | Status: DC
  Administered 2020-11-18 – 2020-11-28 (×16): 10 mL
  Administered 2020-11-28: 11:00:00 20 mL
  Administered 2020-11-29 (×2): 10 mL
  Administered 2020-11-30 – 2020-12-01 (×2): 20 mL
  Administered 2020-12-01 – 2020-12-20 (×32): 10 mL
  Administered 2020-12-20: 20 mL
  Administered 2020-12-20 – 2021-01-02 (×26): 10 mL
  Administered 2021-01-03: 30 mL
  Administered 2021-01-03: 10 mL
  Administered 2021-01-04: 30 mL
  Administered 2021-01-04 – 2021-01-07 (×7): 10 mL
  Administered 2021-01-08: 30 mL
  Administered 2021-01-08 – 2021-01-10 (×4): 10 mL
  Administered 2021-01-10: 20 mL
  Administered 2021-01-11 – 2021-01-13 (×6): 10 mL
  Administered 2021-01-14 (×2): 20 mL
  Administered 2021-01-17: 10 mL

## 2020-11-18 MED ORDER — FENTANYL CITRATE (PF) 100 MCG/2ML IJ SOLN
25.0000 ug | INTRAMUSCULAR | Status: DC | PRN
  Administered 2020-11-18: 100 ug via INTRAVENOUS
  Administered 2020-11-20: 50 ug via INTRAVENOUS
  Administered 2020-11-21: 25 ug via INTRAVENOUS
  Administered 2020-11-21: 50 ug via INTRAVENOUS
  Administered 2020-11-21: 25 ug via INTRAVENOUS
  Administered 2020-11-21: 50 ug via INTRAVENOUS
  Administered 2020-11-21 – 2020-11-24 (×9): 100 ug via INTRAVENOUS
  Administered 2020-11-24: 50 ug via INTRAVENOUS
  Administered 2020-11-25 – 2020-12-03 (×5): 100 ug via INTRAVENOUS
  Filled 2020-11-18 (×21): qty 2

## 2020-11-18 MED ORDER — CEFAZOLIN SODIUM 1 G IJ SOLR
INTRAMUSCULAR | Status: AC
  Filled 2020-11-18: qty 20

## 2020-11-18 MED ORDER — CHLORHEXIDINE GLUCONATE 0.12% ORAL RINSE (MEDLINE KIT)
15.0000 mL | Freq: Two times a day (BID) | OROMUCOSAL | Status: DC
  Administered 2020-11-18 – 2020-12-09 (×42): 15 mL via OROMUCOSAL

## 2020-11-18 MED ORDER — FENTANYL CITRATE (PF) 250 MCG/5ML IJ SOLN
INTRAMUSCULAR | Status: AC
  Filled 2020-11-18: qty 5

## 2020-11-18 MED ORDER — PANTOPRAZOLE SODIUM 40 MG IV SOLR
40.0000 mg | Freq: Every day | INTRAVENOUS | Status: DC
  Administered 2020-11-18 – 2020-11-29 (×12): 40 mg via INTRAVENOUS
  Filled 2020-11-18 (×12): qty 40

## 2020-11-18 MED ORDER — ALBUTEROL SULFATE HFA 108 (90 BASE) MCG/ACT IN AERS
INHALATION_SPRAY | RESPIRATORY_TRACT | Status: DC | PRN
  Administered 2020-11-18 (×2): 4 via RESPIRATORY_TRACT

## 2020-11-18 MED ORDER — NOREPINEPHRINE 4 MG/250ML-% IV SOLN
2.0000 ug/min | INTRAVENOUS | Status: DC
  Administered 2020-11-19: 2 ug/min via INTRAVENOUS
  Filled 2020-11-18: qty 250

## 2020-11-18 MED ORDER — ORAL CARE MOUTH RINSE: 15.0000 mL | Freq: Once | OROMUCOSAL | Status: AC

## 2020-11-18 MED ORDER — ROCURONIUM BROMIDE 10 MG/ML (PF) SYRINGE
PREFILLED_SYRINGE | INTRAVENOUS | Status: AC
  Filled 2020-11-18: qty 10

## 2020-11-18 MED ORDER — LACTATED RINGERS IV SOLN: INTRAVENOUS | Status: DC | PRN

## 2020-11-18 MED ORDER — ROCURONIUM BROMIDE 10 MG/ML (PF) SYRINGE
PREFILLED_SYRINGE | INTRAVENOUS | Status: DC | PRN
  Administered 2020-11-18 (×2): 20 mg via INTRAVENOUS
  Administered 2020-11-18: 70 mg via INTRAVENOUS

## 2020-11-18 MED ORDER — MIDAZOLAM HCL 2 MG/2ML IJ SOLN
INTRAMUSCULAR | Status: AC
  Filled 2020-11-18: qty 2

## 2020-11-18 MED ORDER — PHENYLEPHRINE HCL-NACL 10-0.9 MG/250ML-% IV SOLN
INTRAVENOUS | Status: DC | PRN
  Administered 2020-11-18: 75 ug/min via INTRAVENOUS

## 2020-11-18 MED ORDER — POLYETHYLENE GLYCOL 3350 17 G PO PACK: 17.0000 g | PACK | Freq: Every day | ORAL | Status: DC

## 2020-11-18 MED ORDER — DOCUSATE SODIUM 100 MG PO CAPS: 100.0000 mg | ORAL_CAPSULE | Freq: Two times a day (BID) | ORAL | Status: DC | PRN

## 2020-11-18 MED ORDER — PROPOFOL 1000 MG/100ML IV EMUL
INTRAVENOUS | Status: AC
  Filled 2020-11-18: qty 100

## 2020-11-18 MED ORDER — ALBUMIN HUMAN 5 % IV SOLN: 25.0000 g | Freq: Once | INTRAVENOUS | Status: DC

## 2020-11-18 MED ORDER — SUCCINYLCHOLINE CHLORIDE 200 MG/10ML IV SOSY
PREFILLED_SYRINGE | INTRAVENOUS | Status: AC
  Filled 2020-11-18: qty 20

## 2020-11-18 MED ORDER — NOREPINEPHRINE 4 MG/250ML-% IV SOLN
0.0000 ug/min | INTRAVENOUS | Status: DC
  Filled 2020-11-18: qty 250

## 2020-11-18 MED ORDER — HEMOSTATIC AGENTS (NO CHARGE) OPTIME
TOPICAL | Status: DC | PRN
  Administered 2020-11-18: 1 via TOPICAL

## 2020-11-18 MED ORDER — SODIUM CHLORIDE (PF) 0.9 % IJ SOLN
INTRAMUSCULAR | Status: AC
  Filled 2020-11-18: qty 10

## 2020-11-18 MED ORDER — ROCURONIUM BROMIDE 10 MG/ML (PF) SYRINGE
PREFILLED_SYRINGE | INTRAVENOUS | Status: AC
  Filled 2020-11-18: qty 30

## 2020-11-18 MED ORDER — FENTANYL CITRATE (PF) 250 MCG/5ML IJ SOLN
INTRAMUSCULAR | Status: DC | PRN
  Administered 2020-11-18 (×3): 50 ug via INTRAVENOUS

## 2020-11-18 MED ORDER — POLYETHYLENE GLYCOL 3350 17 G PO PACK: 17.0000 g | PACK | Freq: Every day | ORAL | Status: DC | PRN

## 2020-11-18 MED ORDER — SODIUM CHLORIDE 0.9 % IV SOLN
250.0000 mL | INTRAVENOUS | Status: DC
  Administered 2020-11-18: 20:00:00 250 mL via INTRAVENOUS
  Administered 2020-11-22: 09:00:00 10 mL via INTRAVENOUS

## 2020-11-18 MED ORDER — PROPOFOL 10 MG/ML IV BOLUS
INTRAVENOUS | Status: DC | PRN
  Administered 2020-11-18: 80 mg via INTRAVENOUS

## 2020-11-18 MED ORDER — POTASSIUM PHOSPHATES 15 MMOLE/5ML IV SOLN
10.0000 mmol | Freq: Once | INTRAVENOUS | Status: AC
  Administered 2020-11-18: 10 mmol via INTRAVENOUS
  Filled 2020-11-18: qty 3.33

## 2020-11-18 MED ORDER — CHLORHEXIDINE GLUCONATE 0.12 % MT SOLN
15.0000 mL | Freq: Once | OROMUCOSAL | Status: AC
  Administered 2020-11-18: 15 mL via OROMUCOSAL

## 2020-11-18 MED ORDER — SODIUM CHLORIDE 0.9% FLUSH
10.0000 mL | INTRAVENOUS | Status: DC | PRN
  Administered 2020-11-24 – 2021-01-13 (×6): 10 mL

## 2020-11-18 MED ORDER — ORAL CARE MOUTH RINSE
15.0000 mL | OROMUCOSAL | Status: DC
  Administered 2020-11-18 – 2020-12-09 (×205): 15 mL via OROMUCOSAL

## 2020-11-18 MED ORDER — ONDANSETRON HCL 4 MG/2ML IJ SOLN
INTRAMUSCULAR | Status: AC
  Filled 2020-11-18: qty 2

## 2020-11-18 MED ORDER — MAGNESIUM SULFATE IN D5W 1-5 GM/100ML-% IV SOLN
1.0000 g | Freq: Once | INTRAVENOUS | Status: AC
  Administered 2020-11-18: 1 g via INTRAVENOUS
  Filled 2020-11-18 (×2): qty 100

## 2020-11-18 MED ORDER — SODIUM CHLORIDE 0.9% IV SOLUTION: Freq: Once | INTRAVENOUS | Status: AC

## 2020-11-18 MED ORDER — FENTANYL CITRATE (PF) 100 MCG/2ML IJ SOLN
25.0000 ug | INTRAMUSCULAR | Status: AC | PRN
  Administered 2020-11-19 – 2020-11-21 (×3): 25 ug via INTRAVENOUS
  Filled 2020-11-18 (×3): qty 2

## 2020-11-18 MED ORDER — DEXAMETHASONE SODIUM PHOSPHATE 10 MG/ML IJ SOLN
INTRAMUSCULAR | Status: AC
  Filled 2020-11-18: qty 2

## 2020-11-18 MED ORDER — FENTANYL CITRATE (PF) 100 MCG/2ML IJ SOLN
INTRAMUSCULAR | Status: AC
  Administered 2020-11-18: 50 ug
  Filled 2020-11-18: qty 2

## 2020-11-18 MED ORDER — DEXAMETHASONE SODIUM PHOSPHATE 10 MG/ML IJ SOLN
INTRAMUSCULAR | Status: DC | PRN
  Administered 2020-11-18: 5 mg via INTRAVENOUS

## 2020-11-18 MED ORDER — 0.9 % SODIUM CHLORIDE (POUR BTL) OPTIME
TOPICAL | Status: DC | PRN
  Administered 2020-11-18 (×5): 1000 mL

## 2020-11-18 MED ORDER — DOCUSATE SODIUM 50 MG/5ML PO LIQD: 100.0000 mg | Freq: Two times a day (BID) | ORAL | Status: DC

## 2020-11-18 SURGICAL SUPPLY — 67 items
APL PRP STRL LF DISP 70% ISPRP (MISCELLANEOUS) ×2
BAG DRN RND TRDRP ANRFLXCHMBR (UROLOGICAL SUPPLIES) ×6
BAG URINE DRAIN 2000ML AR STRL (UROLOGICAL SUPPLIES) ×8 IMPLANT
BLADE CLIPPER SURG (BLADE) IMPLANT
BNDG GAUZE ELAST 4 BULKY (GAUZE/BANDAGES/DRESSINGS) ×4 IMPLANT
BRR ADH 5X3 SEPRAFILM 6 SHT (MISCELLANEOUS) ×3
CANISTER SUCT 3000ML PPV (MISCELLANEOUS) ×4 IMPLANT
CANISTER WOUNDNEG PRESSURE 500 (CANNISTER) ×4 IMPLANT
CHLORAPREP W/TINT 26 (MISCELLANEOUS) ×4 IMPLANT
COVER SURGICAL LIGHT HANDLE (MISCELLANEOUS) ×4 IMPLANT
COVER WAND RF STERILE (DRAPES) IMPLANT
DRAPE LAPAROSCOPIC ABDOMINAL (DRAPES) ×4 IMPLANT
DRAPE WARM FLUID 44X44 (DRAPES) ×4 IMPLANT
DRSG OPSITE POSTOP 4X10 (GAUZE/BANDAGES/DRESSINGS) IMPLANT
DRSG OPSITE POSTOP 4X8 (GAUZE/BANDAGES/DRESSINGS) IMPLANT
DRSG VAC ATS MED SENSATRAC (GAUZE/BANDAGES/DRESSINGS) ×4 IMPLANT
ELECT BLADE 6.5 EXT (BLADE) ×4 IMPLANT
ELECT CAUTERY BLADE 6.4 (BLADE) ×4 IMPLANT
ELECT REM PT RETURN 9FT ADLT (ELECTROSURGICAL) ×4
ELECTRODE REM PT RTRN 9FT ADLT (ELECTROSURGICAL) ×3 IMPLANT
GLOVE BIO SURGEON STRL SZ7 (GLOVE) ×8 IMPLANT
GLOVE BIO SURGEON STRL SZ7.5 (GLOVE) ×4 IMPLANT
GLOVE BIO SURGEON STRL SZ8 (GLOVE) ×4 IMPLANT
GLOVE BIOGEL PI IND STRL 7.5 (GLOVE) ×3 IMPLANT
GLOVE BIOGEL PI IND STRL 8 (GLOVE) ×3 IMPLANT
GLOVE BIOGEL PI INDICATOR 7.5 (GLOVE) ×1
GLOVE BIOGEL PI INDICATOR 8 (GLOVE) ×1
GLOVE ECLIPSE 7.0 STRL STRAW (GLOVE) ×8 IMPLANT
GLOVE INDICATOR 6.5 STRL GRN (GLOVE) ×8 IMPLANT
GLOVE INDICATOR 7.5 STRL GRN (GLOVE) ×4 IMPLANT
GOWN STRL REUS W/ TWL LRG LVL3 (GOWN DISPOSABLE) ×9 IMPLANT
GOWN STRL REUS W/TWL LRG LVL3 (GOWN DISPOSABLE) ×12
GOWN STRL REUS W/TWL XL LVL3 (GOWN DISPOSABLE) ×4 IMPLANT
HANDLE SUCTION POOLE (INSTRUMENTS) ×3 IMPLANT
KIT BASIN OR (CUSTOM PROCEDURE TRAY) ×4 IMPLANT
KIT OSTOMY DRAINABLE 2.75 STR (WOUND CARE) ×4 IMPLANT
KIT TURNOVER KIT B (KITS) ×4 IMPLANT
LIGASURE IMPACT 36 18CM CVD LR (INSTRUMENTS) ×4 IMPLANT
NS IRRIG 1000ML POUR BTL (IV SOLUTION) ×20 IMPLANT
PACK GENERAL/GYN (CUSTOM PROCEDURE TRAY) ×4 IMPLANT
PAD ABD 8X10 STRL (GAUZE/BANDAGES/DRESSINGS) ×4 IMPLANT
PAD ARMBOARD 7.5X6 YLW CONV (MISCELLANEOUS) ×8 IMPLANT
PENCIL SMOKE EVACUATOR (MISCELLANEOUS) ×4 IMPLANT
POWDER SURGICEL 3.0 GRAM (HEMOSTASIS) ×4 IMPLANT
RELOAD PROXIMATE 75MM BLUE (ENDOMECHANICALS) ×8 IMPLANT
SEPRAFILM PROCEDURAL PACK 3X5 (MISCELLANEOUS) ×4 IMPLANT
SPECIMEN JAR LARGE (MISCELLANEOUS) ×4 IMPLANT
SPONGE ABDOMINAL VAC ABTHERA (MISCELLANEOUS) ×4 IMPLANT
SPONGE LAP 18X18 RF (DISPOSABLE) ×8 IMPLANT
STAPLER PROXIMATE 75MM BLUE (STAPLE) ×4 IMPLANT
STAPLER VISISTAT 35W (STAPLE) ×4 IMPLANT
SUCTION POOLE HANDLE (INSTRUMENTS) ×4
SUT ETHILON 3 0 FSL (SUTURE) ×4 IMPLANT
SUT NOVA NAB DX-16 0-1 5-0 T12 (SUTURE) ×12 IMPLANT
SUT PDS AB 1 TP1 96 (SUTURE) ×8 IMPLANT
SUT SILK 2 0 SH CR/8 (SUTURE) ×8 IMPLANT
SUT SILK 2 0 TIES 10X30 (SUTURE) ×4 IMPLANT
SUT SILK 3 0 SH CR/8 (SUTURE) ×8 IMPLANT
SUT SILK 3 0 TIES 10X30 (SUTURE) ×4 IMPLANT
SUT VIC AB 3-0 SH 18 (SUTURE) IMPLANT
SYR 10ML LL (SYRINGE) ×4 IMPLANT
TAPE CLOTH SURG 6X10 WHT LF (GAUZE/BANDAGES/DRESSINGS) ×4 IMPLANT
TOWEL GREEN STERILE (TOWEL DISPOSABLE) ×8 IMPLANT
TRAY FOLEY MTR SLVR 14FR STAT (SET/KITS/TRAYS/PACK) ×4 IMPLANT
TRAY FOLEY MTR SLVR 16FR STAT (SET/KITS/TRAYS/PACK) IMPLANT
TUBE GASTROSTOMY 18F (CATHETERS) ×4 IMPLANT
YANKAUER SUCT BULB TIP NO VENT (SUCTIONS) IMPLANT

## 2020-11-18 NOTE — Progress Notes (Addendum)
CRITICAL VALUE ALERT  Critical Value:  2.6 Lactic Acid  Date & Time Notied:  2000 12/09/2020  Provider Notified: primary RN to notify provider  Orders Received/Actions taken: Will await further orders.

## 2020-11-18 NOTE — Plan of Care (Signed)
Updated pt's sister Helene Kelp with bronch finding. cxr pending.

## 2020-11-18 NOTE — Consult Note (Addendum)
NAME:  Ronald Wolf, MRN:  [id number], DOB:  17-Aug-1946, LOS: 6 ADMISSION DATE:  12/05/2020, CONSULTATION DATE:  11/17/2020 REFERRING MD: Cyndia Skeeters CHIEF COMPLAINT:  hypotension   Brief History   Ronald Wolf is a 74 yo M w/ PMH of CVA, Seizures, Dementia, Bladder ca s/p urostomy, tobacco use, right glottic carcinoma s/p excision presenting with sbo. Found to have ischemic bowel requiring ex-lap. PCCM consulted due to post-op hypotension / respiratory distress  History of present illness   Ronald Wolf is a 74 yo M w/ above PMH who initially presented to [facility] hospital on 11/11/20 with abdominal pain and nausea of 4-5 day duration. He was found to have SBO but left against medical advice as his family did not feel comfortable with him receiving his medical care in [place]. He came to [facility] ED the next day with similar complaint and was found to have SBO. He was treated with supportive care with bowel rest and ng suction. However his symptoms did not improve. Surgery team was consulted and he was brough to OR for exploratory laparoscopy. He was found to have multiple sections of ischemic bowel which were resected. He was unable to be extubated post-operatively and PCCM was consulted.  Past Medical History  CVA, Dementia, Bladder CA s/p nephrostomy, tobacco use, right glottic carcinoma s/p excision, seizures.  Significant Hospital Events   12/12/2020 Admission 11/21/2020 OR 12/11/2020 Admit to ICU  Consults:  General Surgery  Procedures:  12/05/2020 Ex-lap, G-tube placement  Significant Diagnostic Tests:  11/13/20 CT abd/pelvis IMPRESSION: High-grade distal small bowel obstruction, possibly due to adhesion.  Mild ascites and diffuse mesenteric edema. No evidence of focal inflammatory process or abscess.  Small bilateral pleural effusions and mild right lower lobe Atelectasis.  11/13/20 Chest X-Ray Right hemithorax   Micro Data:  11/17/2020 COVID/Flu negative  Antimicrobials:   N/A  Interim history/subjective:  Ronald Wolf was examined and evaluated at bedside. He was brought from the OR to 4N, requiring intubation for acute respiratory hypoxia and levophed as well as continuous IV fluids for hypotension. CRNA at bedside notes patient had a good amount of blood loss and as such, packed RBC were ordered.   Objective   Blood pressure (!) 172/88, pulse (!) 101, temperature 100.2 F (37.9 C), temperature source Oral, resp. rate 18, height 5\' 11"  (1.803 m), weight 61.2 kg, SpO2 90 %.        Intake/Output Summary (Last 24 hours) at 11/27/2020 1328 Last data filed at 11/22/2020 1319 Gross per 24 hour  Intake 3500 ml  Output 2760 ml  Net 740 ml   Filed Weights   11/14/20 1400 11/16/20 0516 11/17/20 0500  Weight: 60.8 kg 61.6 kg 61.2 kg    Examination: General: thin, frail, sedated, intubated male HENT: Pupils dilated, non reactive. Mucous membranes moist Lungs: Bilateral mechanical ventilator breath sounds. No wheezing, rales, or rhonchi.  Cardiovascular: Tachycardic. No murmurs, gallops, rubs Abdomen: Soft. G tube in place. RLQ urostomy pink stoma. Midline bandage.  Extremities: SCD's Neuro: Sedated. No response to pain.  GU: Urostomy in place  Resolved Hospital Problem list   N/A  Assessment & Plan:   #Acute Hypoxic Respiratory Failure Patient continued to require intubation post operatively, unable to be weaned off of the vent. Chest X-ray performed, revealing right sided hemithorax. Suspect this is the etiology for patient's respiratoyr failure. Differential also includes CVD, PE, pneumothorax.  Plan - Bronch by Dr. [name] to be performed - f/u abg, follow up x-ray post bronch - fentanyl  for sedation/pain - VAP protocol - intubation maintenance care in place - Wean sedation as necessary - HS troponin I/EKG ordered - Do not see benefit in D-dimer at this time, will be positive post operatively and have low suspicion for this.   #Small bowel  obstruction complicated by ischemic bowel with perforation s/p ex-lap with anastomosis and G tube placement. P/O day 0 Present with SBO. Failed conservative measures. Ex-lap today with findings of ischemic bowel s/p resection and G-tube placement. Patient  requiring IV fluids as well as pressors due to hypotension. Suspect this is secondary to hypovolemia in context of acute blood loss from his surgery.  Plan - continue LR bolus+infusion. Not requiring pressors at this time - Midline in place, will need central line if pressors needed - crossed and typed, RBC at bedside ready to be administered - post transfusion CBC - surgery will continue to follow post operatively   #Seizure Disorder Has hx of epilepsy confirmed via EEG 12/24/19. On levetiracetam at home. - Seizure pre-cautions - C/w home meds  #Hx of Bladder Cancer Bladder cancer s/p urostomy placement - urostomy in place, pink stoma. Healthy site.   Best practice (evaluated daily)   Diet: NPO, TPN management per pharmacy Pain/Anxiety/Delirium protocol (if indicated): Fentanyl VAP protocol (if indicated): Y DVT prophylaxis: SCDs GI prophylaxis: PPI Glucose control: SSI Mobility: N/A last date of multidisciplinary goals of care discussionN/A Family and staff present N/A Summary of discussion N/A Follow up goals of care discussion due 11/19/20 Code Status: Full Disposition: ICU  Labs   CBC: Recent Labs  Lab 11/16/2020 1830 11/13/20 0356 11/14/20 0647 11/17/20 0431 11/13/2020 0340  WBC 9.4 7.6 6.4 7.9  --   NEUTROABS 7.2  --   --   --   --   HGB 14.6 11.7* 11.6* 11.5* 11.0*  HCT 46.1 35.8* 35.4* 35.3* 33.1*  MCV 108.5* 104.4* 105.4* 101.7*  --   PLT 389 281 293 302  --     Basic Metabolic Panel: Recent Labs  Lab 11/13/20 0356 11/13/20 0356 11/14/20 0647 11/15/20 0314 11/16/20 0309 11/17/20 0431 12/02/2020 0340  NA 151*   < > 146* 142 137 134* 141  K 3.9   < > 3.9 3.7 4.3 4.1 3.9  CL 119*   < > 113* 109 104 100  106  CO2 17*   < > 24 22 22 24 25   GLUCOSE 119*   < > 125* 138* 134* 133* 131*  BUN 52*   < > 33* 28* 26* 20 14  CREATININE 2.08*   < > 1.54* 1.42* 1.47* 1.33* 1.24  CALCIUM 8.2*   < > 8.5* 8.7* 8.6* 8.4* 8.1*  MG 1.9  --   --   --   --  1.8 1.8  PHOS 2.6   < > 2.4* 1.5* 1.8* 1.8* 2.8   < > = values in this interval not displayed.   GFR: Estimated Creatinine Clearance: 45.2 mL/min (by C-G formula based on SCr of 1.24 mg/dL). Recent Labs  Lab 11/14/2020 1830 12/03/2020 2107 11/13/20 0145 11/13/20 0356 11/13/20 0602 11/14/20 0647 11/17/20 0431  WBC 9.4  --   --  7.6  --  6.4 7.9  LATICACIDVEN  --    < > 3.0* 2.0* 2.0* 1.4  --    < > = values in this interval not displayed.    Liver Function Tests: Recent Labs  Lab 11/13/2020 1830 11/13/20 0356 11/14/20 6195 11/15/20 0314 11/16/20 0309 11/17/20 0431 11/15/2020 0340  AST 22  --   --   --   --  17  --   ALT 18  --   --   --   --  15  --   ALKPHOS 73  --   --   --   --  46  --   BILITOT 0.8  --   --   --   --  1.2  --   PROT 7.1  --   --   --   --  5.6*  --   ALBUMIN 3.4*   < > 2.8* 2.6* 2.6* 2.7* 2.5*   < > = values in this interval not displayed.   Recent Labs  Lab 12/06/2020 1830  LIPASE 57*   No results for input(s): AMMONIA in the last 168 hours.  ABG    Component Value Date/Time   TCO2 26 12/16/2019 1535     Coagulation Profile: No results for input(s): INR, PROTIME in the last 168 hours.  Cardiac Enzymes: No results for input(s): CKTOTAL, CKMB, CKMBINDEX, TROPONINI in the last 168 hours.  HbA1C: Hgb A1c MFr Bld  Date/Time Value Ref Range Status  12/17/2019 08:19 AM 4.5 (L) 4.8 - 5.6 % Final    Comment:    (NOTE) Pre diabetes:          5.7%-6.4% Diabetes:              >6.4% Glycemic control for   <7.0% adults with diabetes   12/18/2018 03:22 AM 4.2 (L) 4.8 - 5.6 % Final    Comment:    (NOTE) Pre diabetes:          5.7%-6.4% Diabetes:              >6.4% Glycemic control for   <7.0% adults with  diabetes     CBG: No results for input(s): GLUCAP in the last 168 hours.  Review of Systems:   Unable to assess  Past Medical History  He,  has a past medical history of Abdominal pain, other specified site (09/26/2013), Anxiety, Arthritis, Atherosclerosis of arteries (05/13/2013), Cough (09/26/2013), Depression, Eating disorder, GERD (gastroesophageal reflux disease), Hepatitis C, Hypertension, Lesion of liver (04/01/2013), Loss of weight (06/21/2014), Medial knee pain (09/26/2013), Palpitations (06/21/2014), and Shortness of breath dyspnea.   Surgical History    Past Surgical History:  Procedure Laterality Date  . IR ANGIO INTRA EXTRACRAN SEL COM CAROTID INNOMINATE BILAT MOD SED  09/09/2017  . IR ANGIO VERTEBRAL SEL VERTEBRAL BILAT MOD SED  09/09/2017  . IR RADIOLOGIST EVAL & MGMT  09/07/2017  . MICROLARYNGOSCOPY WITH CO2 LASER AND EXCISION OF VOCAL CORD LESION N/A 05/14/2016   Procedure: MICROLARYNGOSCOPY WITH CO2 LASER AND EXCISION OF VOCAL CORD LESION;  Surgeon: Melida Quitter, MD;  Location: [facility];  Service: ENT;  Laterality: N/A;  Suspended micro laryngoscopy with CO2 laser excision of vocal fold lesion  . TUMOR REMOVAL  1990   bladder     Social History   reports that he has been smoking cigarettes. He has a 120.00 pack-year smoking history. He has never used smokeless tobacco. He reports current alcohol use. He reports current drug use. Drug: Marijuana.   Family History   His family history includes Arthritis in his mother; Cancer in his brother; Dementia in his father; Diabetes in his mother; Healthy in his sister. There is no history of Prostate cancer, Colon cancer, Breast cancer, Heart disease, or Hypertension.   Allergies Allergies  Allergen Reactions  . Iohexol Hives  Desc: pt broke out in hives years ago from iv contrast. he was given 50mg  benadryl po, 1 hr prior to ct and did fine today w/o complications.  JB      Home Medications  Prior to Admission medications    Medication Sig Start Date End Date Taking? Authorizing Provider  amLODipine (NORVASC) 10 MG tablet Take 1 tablet (10 mg total) by mouth daily. 02/11/20  Yes Mosie Lukes, MD  atorvastatin (LIPITOR) 80 MG tablet TAKE 1 TABLET(80 MG) BY MOUTH DAILY AT 6 PM 02/11/20  Yes Mosie Lukes, MD  carvedilol (COREG) 3.125 MG tablet Take 1 tablet (3.125 mg total) by mouth 2 (two) times daily with a meal. 02/11/20  Yes Mosie Lukes, MD  clopidogrel (PLAVIX) 75 MG tablet TAKE 1 TABLET(75 MG) BY MOUTH DAILY Patient taking differently: Take 75 mg by mouth daily. TAKE 1 TABLET(75 MG) BY MOUTH DAILY 08/21/20  Yes Mosie Lukes, MD  folic acid (FOLVITE) 1 MG tablet Take 1 tablet (1 mg total) by mouth daily. 02/11/20  Yes Mosie Lukes, MD  levETIRAcetam (KEPPRA) 750 MG tablet TAKE 1 TABLET(750 MG) BY MOUTH TWICE DAILY Patient taking differently: Take 750 mg by mouth 2 (two) times daily.  06/30/20  Yes Mosie Lukes, MD  mirtazapine (REMERON) 15 MG tablet Take 15 mg by mouth at bedtime. 11/04/20  Yes [provider]  tetrahydrozoline-zinc (VISINE-AC) 0.05-0.25 % ophthalmic solution Place 1 drop into both eyes daily as needed (dry eyes).    Yes [provider]    Sanjuana Letters, DO 12/08/2020, 1:53 PM PGY-1, [facility] Pager: [phone]

## 2020-11-18 NOTE — Progress Notes (Signed)
Initial Nutrition Assessment  DOCUMENTATION CODES:   Not applicable  INTERVENTION:   -RD will follow for diet advancement and add supplements as appropriate -If prolonged NPO status is anticipated, consider initiation of nutrition support  NUTRITION DIAGNOSIS:   Inadequate oral intake related to altered GI function as evidenced by NPO status.  GOAL:   Patient will meet greater than or equal to 90% of their needs  MONITOR:   Diet advancement, Labs, Weight trends, Skin, I & O's  REASON FOR ASSESSMENT:   NPO/Clear Liquid Diet    ASSESSMENT:   74 year old male with history of cognitive impairment, CVA, HTN, bladder cancer now with urostomy, right-sided glottic carcinoma s/p excision brought to ED with nausea, vomiting and abdominal pain.  Work-up in ED revealed small bowel obstruction.  He also had AKI and lactic acidosis.  Started on bowel rest with NG tube decompression and IV fluid.  General surgery following.  Subsequent x-rays with persistent SBO.  Pt admitted with SBO.   12/2- NGT placed, connected to low intermittent suction  Reviewed I/O's: -1.7 L x 24 hours and -1.9 L since admission  UOP: 800 ml x 24 hours  NGT output: 850 ml x 24 hours  Pt down in OR at time of visit (per general surgery notes, plan for exploratory laparotomy/ lysis of adhesions/ possible bowel resection today). RD unable to obtain further nutrition-related history or complete nutrition-focused physical exam at this time.   Reviewed wt hx; noted wt has been stable over the past year, however, pt with distant history of weight loss. Pt is at high risk for malnutrition, however, unable to identify at this time.   Pt has been without adequate nutrition over the past 6 days due to NPO status. If prolonged NPO status is anticipated, may need to consider nutrition support.   Medications reviewed and include dextrose 5%-0.9% sodium chloride infusion @ 100 ml/hr and lactated ringers infusion @ 10 ml/hr.    Labs reviewed.   Diet Order:   Diet Order            Diet NPO time specified  Diet effective now                 EDUCATION NEEDS:   No education needs have been identified at this time  Skin:  Skin Assessment: Reviewed RN Assessment  Last BM:  Unknown  Height:   Ht Readings from Last 1 Encounters:  11/14/20 5\' 11"  (1.803 m)    Weight:   Wt Readings from Last 1 Encounters:  11/17/20 61.2 kg    Ideal Body Weight:  78.2 kg  BMI:  Body mass index is 18.82 kg/m.  Estimated Nutritional Needs:   Kcal:  2100-2300  Protein:  120-135 grams  Fluid:  > 2 L    Loistine Chance, RD, LDN, [place] Registered Dietitian II Certified Diabetes Care and Education Specialist Please refer to [facility] for RD and/or RD on-call/weekend/after hours pager

## 2020-11-18 NOTE — Consult Note (Signed)
[place] Nurse ostomy consult note Stoma type/location: RLQ urostomy, known to [facility] team.  In OR, request for urostomy pouch.  1 piece urostomy pouch and barrier delivered to OR desk and taken to [place]. Surgical team applied colostomy pouch during procedure.  Urostomy pouch will be delivered with patient to unit and bedside RN will apply correct pouch once settled.  [place] team will see tomorrow and ensure appropriate pouching system is placed.  Wears [employer]vate[employer] products at home, but is familiar with our formulary and agreed to wear [employer] products while here last admission.  Will follow to ensure supplies are appropriate while here.  Domenic Moras MSN, RN, FNP-BC [facility]ON Wound, Ostomy, Continence Nurse Pager [phone]

## 2020-11-18 NOTE — OR Nursing (Signed)
colostomy bag applied over urostomy site and closed off.  Thressa Sheller PA to apply urostomy once pt arrives to 4N. Urostomy supplies sent with pt. To 4N.

## 2020-11-18 NOTE — Transfer of Care (Signed)
Immediate Anesthesia Transfer of Care Note  Patient: Ronald Wolf  Procedure(s) Performed: EXPLORATORY LAPAROTOMY (N/A ) LYSIS OF ADHESIONS SMALL BOWEL RESECTION INSERTION OF GASTROSTOMY TUBE (Left )  Patient Location: ICU  Anesthesia Type:General  Level of Consciousness: sedated and Patient remains intubated per anesthesia plan  Airway & Oxygen Therapy: Patient remains intubated per anesthesia plan and Patient placed on Ventilator (see vital sign flow sheet for setting)  Post-op Assessment: Report given to RN and Post -op Vital signs reviewed and stable  Post vital signs: Reviewed and stable  Last Vitals:  Vitals Value Taken Time  BP    Temp    Pulse 116 12/10/2020 1408  Resp 16 11/27/2020 1408  SpO2 97 % 11/13/2020 1408  Vitals shown include unvalidated device data.  Last Pain:  Vitals:   11/22/2020 0848  TempSrc: Oral  PainSc:          Complications: No complications documented.

## 2020-11-18 NOTE — Op Note (Signed)
Preop diagnosis: Persistent small bowel obstruction Postop diagnosis: Same Procedure performed: Exploratory laparotomy, extensive lysis of adhesions, small bowel resection, insertion of gastrostomy tube  Surgeon:Kenika Sahm K Magdaline Zollars Assistant: Dr. Georganna Skeans, Saverio Danker, PA-C Indications: This is a 74 year old male with multiple medical problems including previous CVA anticoagulated on Plavix, hypertension, previous glottic cancer, and bladder cancer status post cystectomy with ileal conduit who presented to [facility] after his family signed him out AMA from [facility].  He had a small bowel obstruction that has not improved with conservative management.  Despite his risk for perioperative complications due to his comorbidities, he had no progress so we recommended exploratory laparotomy.  He is unable to give his own consent so his family has given consent for surgery.  Description of procedure: The patient is brought to the operating room and placed in the supine position on the operating room table.  After an adequate level of general anesthesia was obtained, his urostomy appliance was removed.  A Foley catheter was inserted into the ileal conduit and the balloon was inflated.  His entire abdomen was prepped with ChloraPrep.  His urostomy was prepped with Betadine.  We draped in sterile fashion.  A timeout was taken to ensure the proper patient and proper procedure.  We made a midline incision through his old scar.  Dissection was carried down into the peritoneal cavity with cautery.  There are some omental adhesions anteriorly.  The small bowel in the upper abdomen is very very distended and purplish in color.  We open the incision widely.  I then began carefully eviscerating the small bowel from the abdominal cavity.  Even with extremely gentle movement of the small bowel there was an immediate perforation with massive spillage of enteric contents.  We quickly grasped the defect and sewed it shut  with a 3-0 silk suture.  There is a closed-loop obstruction in the upper abdomen with some rather ischemic-looking bowel.  We began mobilizing the small bowel out of the abdomen.  There are multiple areas of dense adhesions that required sharp dissection.  This is especially true deep in the pelvis as several loops are densely adherent in the pelvis at the site of the previous cystectomy.  We are able to mobilize all of this bowel out of the pelvis.  There are multiple serosal defects and a couple of areas of frankly ischemic-looking bowel.  We decided to resect a long segment of small bowel.  This segment included all the areas of serosal defect as well as full-thickness perforations.  We divided on either side of the segment with GIA-75.  LigaSure was used to take the mesentery.  The specimen was sent for pathologic examination.  We irrigated the abdomen thoroughly and inspected for hemostasis.  A sponge was packed down into the pelvis.  We created a side-to-side anastomosis with a GIA 75 stapler and a TX 60 stapler.  A crotch suture of 3-0 silk was placed.  We closed the mesenteric defect with 2-0 silk.  We irrigated the abdomen thoroughly again.  There were no other signs of ischemic bowel or bleeding.  There is no sign of leak.  We made the decision to place a gastrostomy tube as the patient is likely to be intubated for some time and will require enteral nutrition.  I made a small cruciate incision 3 fingers below the left costal margin.  A 18 French feeding tube was inserted through the stab incision.  We made a small gastrotomy in the  anterior surface of the stomach and inserted the feeding tube.  The balloon was inflated.  This was pulled up to the abdominal wall.  Additional stay sutures of 3-0 silk were placed.  The tube was secured to the skin with 2-0 nylon suture.  We placed Seprafilm in the pelvis and over the small bowel anteriorly.  The fascia was reapproximated with multiple interrupted  figure-of-eight #1 Novafil sutures.  The subcutaneous tissue was packed with Kerlix gauze.  An ostomy appliance was cut to fit over the urostomy after the Foley catheter was removed.  The patient remains intubated and will be transported to the intensive care unit in critical condition.  All sponge, instrument, and needle counts are correct.  Imogene Burn. Georgette Dover, MD, [facility] Surgery  General/ Trauma Surgery   11/13/2020 1:29 PM

## 2020-11-18 NOTE — Anesthesia Procedure Notes (Signed)
Arterial Line Insertion Start/End12/06/2020 11:50 AM Performed by: Darletta Moll, CRNA, CRNA  Patient location: OR. Preanesthetic checklist: patient identified, IV checked, site marked, risks and benefits discussed, surgical consent, monitors and equipment checked, pre-op evaluation, timeout performed and anesthesia consent Lidocaine 1% used for infiltration Left, radial was placed Catheter size: 20 Fr Hand hygiene performed  and maximum sterile barriers used   Attempts: 1 Procedure performed without using ultrasound guided technique. Following insertion, dressing applied and Biopatch. Post procedure assessment: normal and unchanged  Patient tolerated the procedure well with no immediate complications.

## 2020-11-18 NOTE — Progress Notes (Incomplete)
PHARMACY - TOTAL PARENTERAL NUTRITION CONSULT NOTE  Indication: Small bowel obstruction  Patient Measurements: Height: 5\' 11"  (180.3 cm) Weight: 61.2 kg (134 lb 14.7 oz) IBW/kg (Calculated) : 75.3 TPN AdjBW (KG): 60.8 Body mass index is 18.82 kg/m. Usual Weight: 137 lbs  Assessment:  Ronald Wolf presented on 12/11/2020 with nausea, vomiting, abdominal pain after leaving AMA at [facility] with known SBO.  Patient has been on bowel rest with NG tube decompression.  Patient continued to have persistent SBO on imaging with no evidence of improvement, now s/p ex-lap with LoA, SBR and placement of Gtube on 12/06/2020.  Pharmacy consulted for TPN management.  Patient was confused this morning so unable to obtain history on intake prior to admission.   Glucose / Insulin: no hx DM - CBGs <140 Electrolytes: All electrolytes wnl. Phos 2.8 s/p 30 mmol on 12/6 Renal: SCr 1.24 (BL 0.9-1.1), BUN WNL LFTs / TGs: LFTs / tbili WNL (ILE 15% of kCal d/t shortage) Prealbumin / albumin: albumin 2.5 Intake / Output; MIVF: UOP 0.5 ml/kg/hr, D5NS at 175ml/hr  GI Imaging: none since TPN start Surgeries / Procedures: none since TPN start  Central access: PICC order placed 11/18/20 TPN start date: 11/19/20   Nutritional Goals (pre RD rec 12/7): 2100-2300 kCal and 120-135gm protein per day  Current Nutrition:  NPO   Plan:  Initiate TPN at ** at 1800 (goal rate 90 ml/hr - AA 57, ILE 14.5, CHO 18) TPN will provide ** Electrolytes in TPN: Na 23mEq/L, K 30mEq/L, Ca 25mEq/L, Mag 57mEq/L, Phos  56mmol/L, Cl:Ac 1:1 Daily multivitamin and trace elements in TPN Add folate 1mg  to daily TPN since on folate supplementation PTA Initiate sensitive SSI Q6H Reduce D5NS to ** when TPN starts Magnesium 1g x1  K phos 10 mmol x1  F/U AM labs

## 2020-11-18 NOTE — Procedures (Signed)
Bronchoscopy Procedure Note  Ronald Wolf  [id number]  10/08/1946  Date:12/03/2020  Time:4:28 PM   Provider Performing:Zebastian Carico Ronald Wolf   Procedure(s):  Flexible bronchoscopy with bronchial alveolar lavage [phone])  Indication(s) R hemithorax opacification   Consent Risks of the procedure as well as the alternatives and risks of each were explained to the patient and/or caregiver.  Consent for the procedure was obtained and is signed in the bedside chart  Anesthesia Propofol/fentanyl   Time Out Verified patient identification, verified procedure, site/side was marked, verified correct patient position, special equipment/implants available, medications/allergies/relevant history reviewed, required imaging and test results available.   Sterile Technique Usual hand hygiene, masks, gowns, and gloves were used   Procedure Description Bronchoscope advanced through endotracheal tube and into airway.  Airways were examined down to subsegmental level with findings noted below.   Following diagnostic evaluation, BAL(s) performed in RUL with normal saline and return of 12m fluid  Findings: mucous plugging, white thick    Complications/Tolerance None; patient tolerated the procedure well. Chest X-ray is needed post procedure.   EBL noen   Specimen(s) Bal washing

## 2020-11-18 NOTE — Progress Notes (Signed)
Patient ID: Ronald Wolf, male   DOB: Feb 20, 1946, 74 y.o.   MRN: [id number]  Patient resting comfortably No BM Abd - distended NG 850 cc  Will proceed with surgery today as planned.  Imogene Burn. Georgette Dover, MD, [facility]  General/ Trauma Surgery   11/26/2020 7:35 AM

## 2020-11-18 NOTE — Progress Notes (Signed)
Peripherally Inserted Central Catheter Placement  The IV Nurse has discussed with the patient and/or persons authorized to consent for the patient, the purpose of this procedure and the potential benefits and risks involved with this procedure.  The benefits include less needle sticks, lab draws from the catheter, and the patient may be discharged home with the catheter. Risks include, but not limited to, infection, bleeding, blood clot (thrombus formation), and puncture of an artery; nerve damage and irregular heartbeat and possibility to perform a PICC exchange if needed/ordered by physician.  Alternatives to this procedure were also discussed.  Bard Power PICC patient education guide, fact sheet on infection prevention and patient information card has been provided to patient /or left at bedside.  Telephone consent obtained from daughter.  PICC Placement Documentation  PICC Triple Lumen 11/17/2020 PICC Left Brachial 46 cm 0 cm (Active)  Indication for Insertion or Continuance of Line Prolonged intravenous therapies;Administration of hyperosmolar/irritating solutions (i.e. TPN, Vancomycin, etc.) 11/26/2020 1804  Exposed Catheter (cm) 0 cm 12/02/2020 1804  Site Assessment Clean;Dry;Intact 12/01/2020 1804  Lumen #1 Status Flushed;Saline locked;Blood return noted 12/04/2020 1804  Lumen #2 Status Flushed;Saline locked;Blood return noted 12/02/2020 1804  Lumen #3 Status Flushed;Saline locked;Blood return noted 11/14/2020 1804  Dressing Type Transparent 11/13/2020 1804  Dressing Status Clean;Dry;Intact 11/26/2020 1804  Antimicrobial disc in place? Yes 11/16/2020 1804  Safety Lock Not Applicable 96/43/83 8184  Line Care Connections checked and tightened 12/08/2020 1804  Line Adjustment (NICU/IV Team Only) No 11/24/2020 1804  Dressing Intervention New dressing 11/20/2020 1804  Dressing Change Due 11/25/20 11/27/2020 1804       Pryce Folts, Nicolette Bang 11/16/2020, 6:05 PM

## 2020-11-18 NOTE — Progress Notes (Signed)
RT assisted Dr. [name] performing a bedside bronchoscopy. Sputum sample obtained. Pt tolerated well. RT will continue to monitor.

## 2020-11-18 NOTE — Progress Notes (Signed)
Bronch in progress. RN at bedside, cont VS being monitored.

## 2020-11-18 NOTE — Progress Notes (Signed)
eLink Physician-Brief Progress Note Patient Name: Ronald Wolf DOB: 05-29-1946 MRN: [id number]   Date of Service  11/28/2020  HPI/Events of Note  Patient is s/p an ex-lap for small bowel resection secondary to SBO, lactic acid is now 3.1.  eICU Interventions  Albumin 5 % 500 ml iv x 1        [name] U [name] 11/23/2020, 11:50 PM

## 2020-11-18 NOTE — Progress Notes (Signed)
PT Cancellation Note  Patient Details Name: Ronald Wolf MRN: [id number] DOB: 1946/12/09   Cancelled Treatment:    Reason Eval/Treat Not Completed: (P) Patient at procedure or test/unavailable (pt at OR for sx for SBO.) Will continue efforts per PT POC as schedule permits.   Ronald Wolf M Raj Landress 12/10/2020, 10:04 AM

## 2020-11-18 NOTE — Progress Notes (Signed)
Bronch done

## 2020-11-18 NOTE — Anesthesia Preprocedure Evaluation (Addendum)
Anesthesia Evaluation  Patient identified by MRN, date of birth, ID band Patient awake    Reviewed: Allergy & Precautions, NPO status , Patient's Chart, lab work & pertinent test results, reviewed documented beta blocker date and time   Airway Mallampati: III  TM Distance: >3 FB Neck ROM: Full    Dental  (+) Edentulous Upper, Edentulous Lower   Pulmonary neg pulmonary ROS, Current Smoker,    Pulmonary exam normal breath sounds clear to auscultation       Cardiovascular hypertension, Pt. on home beta blockers and Pt. on medications + Peripheral Vascular Disease  Normal cardiovascular exam Rhythm:Regular Rate:Normal  TTE 12/2019 Normal EF, G1DD, valves ok   Neuro/Psych Seizures - (on keppra), Well Controlled,  PSYCHIATRIC DISORDERS Anxiety Depression CVA    GI/Hepatic GERD  ,(+)     substance abuse  marijuana use, Hepatitis -, C  Endo/Other  negative endocrine ROS  Renal/GU negative Renal ROS   H/o bladder CA s/p urostomy negative genitourinary   Musculoskeletal  (+) Arthritis ,   Abdominal   Peds  Hematology  (+) Blood dyscrasia (Hgb 11.0, on plavix), anemia ,   Anesthesia Other Findings presented to [facility] ED from OSH with known SBO  Reproductive/Obstetrics                            Anesthesia Physical Anesthesia Plan  ASA: III  Anesthesia Plan: General   Post-op Pain Management:    Induction: Intravenous and Rapid sequence  PONV Risk Score and Plan: 1 and Midazolam, Dexamethasone and Ondansetron  Airway Management Planned: Oral ETT  Additional Equipment:   Intra-op Plan:   Post-operative Plan: Extubation in OR and Possible Post-op intubation/ventilation  Informed Consent: I have reviewed the patients History and Physical, chart, labs and discussed the procedure including the risks, benefits and alternatives for the proposed anesthesia with the patient or authorized  representative who has indicated his/her understanding and acceptance.     Dental advisory given  Plan Discussed with: CRNA  Anesthesia Plan Comments:        Anesthesia Quick Evaluation

## 2020-11-18 NOTE — Progress Notes (Signed)
Patient underwent exploratory laparotomy, extensive lysis of adhesions, small bowel resection and insertion of gastrostomy tube this morning.  Postop, patient remained intubated and transferred to the intensive care unit in critical condition.  Intensivist consulted by general surgery.   [facility] hospitalist will assume care once patient extubated, and appropriate for our service.

## 2020-11-18 NOTE — Progress Notes (Signed)
BP low, neo restarted.

## 2020-11-18 NOTE — Progress Notes (Signed)
Bronchoscopy specimen walked down to lab.

## 2020-11-18 NOTE — Anesthesia Postprocedure Evaluation (Signed)
Anesthesia Post Note  Patient: Ronald Wolf  Procedure(s) Performed: EXPLORATORY LAPAROTOMY (N/A ) LYSIS OF ADHESIONS SMALL BOWEL RESECTION INSERTION OF GASTROSTOMY TUBE (Left )     Patient location during evaluation: SICU Anesthesia Type: General Level of consciousness: sedated Pain management: pain level controlled Vital Signs Assessment: post-procedure vital signs reviewed and stable Respiratory status: patient remains intubated per anesthesia plan Cardiovascular status: stable Postop Assessment: no apparent nausea or vomiting Anesthetic complications: no   No complications documented.  Last Vitals:  Vitals:   11/17/2020 1547 11/13/2020 1600  BP:  96/78  Pulse: (!) 103   Resp:  16  Temp:    SpO2: 100%     Last Pain:  Vitals:   11/28/2020 0848  TempSrc: Oral  PainSc:                  [place] S

## 2020-11-18 NOTE — Progress Notes (Signed)
PHARMACY - TOTAL PARENTERAL NUTRITION CONSULT NOTE   Indication: Small bowel obstruction  Patient Measurements: Height: 5\' 11"  (180.3 cm) Weight: 61.2 kg (134 lb 14.7 oz) IBW/kg (Calculated) : 75.3 TPN AdjBW (KG): 60.8 Body mass index is 18.82 kg/m. Usual Weight: 137 lbs  Assessment: 74 yo male presented on 12/12/2020 with nausea, vomiting, abdominal pain after leaving AMA at [facility] with known SBO. Patient has been on bowel rest with NG tube decompression. Patient continued to have persistent SBO with no evidence of improvement. Plan for OR today, 11/17/2020, for ex lab, lysis of adhesions, and possible bowel resection. Pharmacy consulted to start TPN. Patient was confused this morning so unable to obtain history on intake prior to admission.   Glucose / Insulin: CBGs <140. A1c 4.5 in 12/2019. No hx of DM.  Electrolytes: All electrolytes wnl. Phos 2.8 s/p 30 mmol on 12/6 Renal: Scr 1.24 (BL 0.9-1.1). BUN 14.  LFTs / TGs: LFTs wnl. Tbilli 1.2. No recent TG Prealbumin / albumin: albumin 2.5. no prealbumin Intake / Output; MIVF: UOP 0.5 ml/kg/hr. D5NS @ 128ml/hr  GI Imaging: 12/2 xray: SBO 12/2 CT: high grade distal SBP possibly due to adhesion  12/3 xray: persistent SBO, no change  12/4 xray: persistent SBO Surgeries / Procedures:  12/7 ex lab planned   Central access: PICC order placed 12/7 TPN start date: planned start 12/8 after PICC placement   Nutritional Goals pending RD recommendations   Current Nutrition:  NPO  TPN - planned for 12/8 when PICC placed   Plan:  Start TPN on 12/8 after PICC placement due to unlikely will have PICC placed prior to 1800 to start TPN due to OR procedure  discussed with IV team Magnesium 1g x1  K phos 10 mmol x1  Monitor TPN labs on Mon/Thurs, repeat labs tomorrow  Continue MIVF per MD and adjust when TPN starts    Cristela Felt, PharmD Clinical Pharmacist  11/28/2020,8:44 AM

## 2020-11-18 NOTE — Anesthesia Procedure Notes (Signed)
Procedure Name: Intubation Date/Time: 11/30/2020 10:25 AM Performed by: Gaylene Brooks, CRNA Pre-anesthesia Checklist: Patient identified, Emergency Drugs available, Suction available and Patient being monitored Patient Re-evaluated:Patient Re-evaluated prior to induction Oxygen Delivery Method: Circle System Utilized Preoxygenation: Pre-oxygenation with 100% oxygen Induction Type: IV induction, Rapid sequence and Cricoid Pressure applied Laryngoscope Size: Miller and 2 Grade View: Grade I Tube type: Oral Tube size: 7.5 mm Number of attempts: 1 Airway Equipment and Method: Stylet and Oral airway Placement Confirmation: ETT inserted through vocal cords under direct vision,  positive ETCO2 and breath sounds checked- equal and bilateral Secured at: 23 cm Tube secured with: Tape Dental Injury: Teeth and Oropharynx as per pre-operative assessment

## 2020-11-19 ENCOUNTER — Encounter (HOSPITAL_COMMUNITY): Payer: Self-pay | Admitting: Surgery

## 2020-11-19 DIAGNOSIS — K56609 Unspecified intestinal obstruction, unspecified as to partial versus complete obstruction: Secondary | ICD-10-CM | POA: Diagnosis not present

## 2020-11-19 LAB — BPAM RBC
Blood Product Expiration Date: 202201052359
Blood Product Expiration Date: 202201052359
ISSUE DATE / TIME: 202112071323
ISSUE DATE / TIME: 202112071323
Unit Type and Rh: 7300
Unit Type and Rh: 7300

## 2020-11-19 LAB — COMPREHENSIVE METABOLIC PANEL
ALT: 11 U/L (ref 0–44)
AST: 24 U/L (ref 15–41)
Albumin: 2.7 g/dL — ABNORMAL LOW (ref 3.5–5.0)
Alkaline Phosphatase: 27 U/L — ABNORMAL LOW (ref 38–126)
Anion gap: 15 (ref 5–15)
BUN: 21 mg/dL (ref 8–23)
CO2: 15 mmol/L — ABNORMAL LOW (ref 22–32)
Calcium: 7.3 mg/dL — ABNORMAL LOW (ref 8.9–10.3)
Chloride: 109 mmol/L (ref 98–111)
Creatinine, Ser: 2.08 mg/dL — ABNORMAL HIGH (ref 0.61–1.24)
GFR, Estimated: 33 mL/min — ABNORMAL LOW (ref >60)
Glucose, Bld: 117 mg/dL — ABNORMAL HIGH (ref 70–99)
Potassium: 4.3 mmol/L (ref 3.5–5.1)
Sodium: 139 mmol/L (ref 135–145)
Total Bilirubin: 2.7 mg/dL — ABNORMAL HIGH (ref 0.3–1.2)
Total Protein: 4.4 g/dL — ABNORMAL LOW (ref 6.5–8.1)

## 2020-11-19 LAB — TYPE AND SCREEN
ABO/RH(D): B POS
Antibody Screen: NEGATIVE
Unit division: 0
Unit division: 0

## 2020-11-19 LAB — CBC
HCT: 34.8 % — ABNORMAL LOW (ref 39.0–52.0)
Hemoglobin: 11.7 g/dL — ABNORMAL LOW (ref 13.0–17.0)
MCH: 32.5 pg (ref 26.0–34.0)
MCHC: 33.6 g/dL (ref 30.0–36.0)
MCV: 96.7 fL (ref 80.0–100.0)
Platelets: 159 10*3/uL (ref 150–400)
RBC: 3.6 MIL/uL — ABNORMAL LOW (ref 4.22–5.81)
RDW: 15.7 % — ABNORMAL HIGH (ref 11.5–15.5)
WBC: 31.4 10*3/uL — ABNORMAL HIGH (ref 4.0–10.5)
nRBC: 0.1 % (ref 0.0–0.2)

## 2020-11-19 LAB — DIFFERENTIAL
Abs Immature Granulocytes: 0 10*3/uL (ref 0.00–0.07)
Basophils Absolute: 0 10*3/uL (ref 0.0–0.1)
Basophils Relative: 0 %
Eosinophils Absolute: 0 10*3/uL (ref 0.0–0.5)
Eosinophils Relative: 0 %
Lymphocytes Relative: 6 %
Lymphs Abs: 1.9 10*3/uL (ref 0.7–4.0)
Monocytes Absolute: 3.1 10*3/uL — ABNORMAL HIGH (ref 0.1–1.0)
Monocytes Relative: 10 %
Neutro Abs: 26.4 10*3/uL — ABNORMAL HIGH (ref 1.7–7.7)
Neutrophils Relative %: 84 %
nRBC: 1 /100 WBC — ABNORMAL HIGH

## 2020-11-19 LAB — HEMOGLOBIN A1C
Hgb A1c MFr Bld: 5 % (ref 4.8–5.6)
Mean Plasma Glucose: 96.8 mg/dL

## 2020-11-19 LAB — PREALBUMIN: Prealbumin: 6.7 mg/dL — ABNORMAL LOW (ref 18–38)

## 2020-11-19 LAB — MAGNESIUM: Magnesium: 1.7 mg/dL (ref 1.7–2.4)

## 2020-11-19 LAB — LACTIC ACID, PLASMA
Lactic Acid, Venous: 3.5 mmol/L (ref 0.5–1.9)
Lactic Acid, Venous: 2.9 mmol/L (ref 0.5–1.9)

## 2020-11-19 LAB — GLUCOSE, CAPILLARY
Glucose-Capillary: 148 mg/dL — ABNORMAL HIGH (ref 70–99)
Glucose-Capillary: 84 mg/dL (ref 70–99)

## 2020-11-19 LAB — SURGICAL PATHOLOGY

## 2020-11-19 LAB — TRIGLYCERIDES: Triglycerides: 63 mg/dL (ref <150)

## 2020-11-19 LAB — PHOSPHORUS
Phosphorus: 6.3 mg/dL — ABNORMAL HIGH (ref 2.5–4.6)
Phosphorus: 5.6 mg/dL — ABNORMAL HIGH (ref 2.5–4.6)

## 2020-11-19 MED ORDER — FLUCONAZOLE IN SODIUM CHLORIDE 200-0.9 MG/100ML-% IV SOLN
200.0000 mg | INTRAVENOUS | Status: AC
  Administered 2020-11-19 – 2020-11-21 (×3): 200 mg via INTRAVENOUS
  Filled 2020-11-19 (×3): qty 100

## 2020-11-19 MED ORDER — SODIUM BICARBONATE 8.4 % IV SOLN
100.0000 meq | Freq: Once | INTRAVENOUS | Status: AC
  Administered 2020-11-19: 100 meq via INTRAVENOUS
  Filled 2020-11-19: qty 50

## 2020-11-19 MED ORDER — FENTANYL 2500MCG IN NS 250ML (10MCG/ML) PREMIX INFUSION
25.0000 ug/h | INTRAVENOUS | Status: DC
  Administered 2020-11-19: 25 ug/h via INTRAVENOUS
  Filled 2020-11-19: qty 250

## 2020-11-19 MED ORDER — CALCIUM GLUCONATE-NACL 2-0.675 GM/100ML-% IV SOLN
2.0000 g | Freq: Once | INTRAVENOUS | Status: DC
  Filled 2020-11-19: qty 100

## 2020-11-19 MED ORDER — SODIUM CHLORIDE 0.9 % IV SOLN
3.0000 g | Freq: Two times a day (BID) | INTRAVENOUS | Status: DC
  Filled 2020-11-19 (×2): qty 8

## 2020-11-19 MED ORDER — PIPERACILLIN-TAZOBACTAM 3.375 G IVPB
3.3750 g | Freq: Three times a day (TID) | INTRAVENOUS | Status: AC
  Administered 2020-11-19 – 2020-11-23 (×13): 3.375 g via INTRAVENOUS
  Filled 2020-11-19 (×14): qty 50

## 2020-11-19 MED ORDER — TRAVASOL 10 % IV SOLN
INTRAVENOUS | Status: DC
  Filled 2020-11-19: qty 615.6

## 2020-11-19 MED ORDER — LEVETIRACETAM IN NACL 500 MG/100ML IV SOLN
500.0000 mg | Freq: Two times a day (BID) | INTRAVENOUS | Status: DC
  Administered 2020-11-19 – 2020-11-20 (×3): 500 mg via INTRAVENOUS
  Filled 2020-11-19 (×3): qty 100

## 2020-11-19 MED ORDER — MAGNESIUM SULFATE 2 GM/50ML IV SOLN
2.0000 g | Freq: Once | INTRAVENOUS | Status: AC
  Administered 2020-11-19: 2 g via INTRAVENOUS
  Filled 2020-11-19: qty 50

## 2020-11-19 MED ORDER — FENTANYL BOLUS VIA INFUSION
25.0000 ug | INTRAVENOUS | Status: DC | PRN
  Administered 2020-11-20 (×2): 25 ug via INTRAVENOUS
  Filled 2020-11-19: qty 25

## 2020-11-19 MED ORDER — FENTANYL CITRATE (PF) 100 MCG/2ML IJ SOLN: 25.0000 ug | Freq: Once | INTRAMUSCULAR | Status: DC

## 2020-11-19 MED ORDER — SODIUM CHLORIDE 0.9 % IV SOLN: INTRAVENOUS | Status: AC

## 2020-11-19 MED ORDER — DEXMEDETOMIDINE HCL IN NACL 400 MCG/100ML IV SOLN
0.4000 ug/kg/h | INTRAVENOUS | Status: DC
  Administered 2020-11-19: 10:00:00 0.4 ug/kg/h via INTRAVENOUS
  Administered 2020-11-20: 22:00:00 0.2 ug/kg/h via INTRAVENOUS
  Administered 2020-11-21 – 2020-11-22 (×2): 0.5 ug/kg/h via INTRAVENOUS
  Administered 2020-11-24: 16:00:00 0.2 ug/kg/h via INTRAVENOUS
  Administered 2020-11-25: 22:00:00 0.4 ug/kg/h via INTRAVENOUS
  Administered 2020-11-25: 09:00:00 0.6 ug/kg/h via INTRAVENOUS
  Administered 2020-11-26: 08:00:00 0.4 ug/kg/h via INTRAVENOUS
  Administered 2020-11-26: 09:00:00 0.3 ug/kg/h via INTRAVENOUS
  Administered 2020-11-27: 07:00:00 0.4 ug/kg/h via INTRAVENOUS
  Administered 2020-11-27: 20:00:00 0.5 ug/kg/h via INTRAVENOUS
  Administered 2020-11-28 – 2020-11-30 (×4): 0.4 ug/kg/h via INTRAVENOUS
  Administered 2020-11-30 – 2020-12-01 (×2): 0.3 ug/kg/h via INTRAVENOUS
  Administered 2020-12-02: 16:00:00 0.4 ug/kg/h via INTRAVENOUS
  Administered 2020-12-03: 23:00:00 0.5 ug/kg/h via INTRAVENOUS
  Administered 2020-12-03: 10:00:00 0.4 ug/kg/h via INTRAVENOUS
  Administered 2020-12-04: 05:00:00 1.2 ug/kg/h via INTRAVENOUS
  Administered 2020-12-04: 12:00:00 1.1 ug/kg/h via INTRAVENOUS
  Administered 2020-12-04: 19:00:00 0.8 ug/kg/h via INTRAVENOUS
  Administered 2020-12-05 (×4): 1 ug/kg/h via INTRAVENOUS
  Administered 2020-12-06: 16:00:00 0.4667 ug/kg/h via INTRAVENOUS
  Administered 2020-12-07: 02:00:00 0.8 ug/kg/h via INTRAVENOUS
  Administered 2020-12-08 (×3): 0.6 ug/kg/h via INTRAVENOUS
  Administered 2020-12-09: 20:00:00 0.4 ug/kg/h via INTRAVENOUS
  Administered 2020-12-10: 05:00:00 0.7 ug/kg/h via INTRAVENOUS
  Filled 2020-11-19 (×35): qty 100

## 2020-11-19 MED ORDER — CALCIUM GLUCONATE-NACL 1-0.675 GM/50ML-% IV SOLN
1.0000 g | Freq: Once | INTRAVENOUS | Status: AC
  Administered 2020-11-19: 1000 mg via INTRAVENOUS
  Filled 2020-11-19: qty 50

## 2020-11-19 MED ORDER — ALBUMIN HUMAN 5 % IV SOLN
25.0000 g | Freq: Once | INTRAVENOUS | Status: AC
  Administered 2020-11-19: 25 g via INTRAVENOUS
  Filled 2020-11-19: qty 500

## 2020-11-19 MED ORDER — TRAVASOL 10 % IV SOLN
INTRAVENOUS | Status: AC
  Filled 2020-11-19: qty 615.6

## 2020-11-19 MED ORDER — INSULIN ASPART 100 UNIT/ML ~~LOC~~ SOLN
0.0000 [IU] | Freq: Four times a day (QID) | SUBCUTANEOUS | Status: DC
  Administered 2020-11-20 (×3): 1 [IU] via SUBCUTANEOUS
  Administered 2020-11-21: 2 [IU] via SUBCUTANEOUS
  Administered 2020-11-21 – 2020-11-22 (×3): 1 [IU] via SUBCUTANEOUS
  Administered 2020-11-22: 19:00:00 2 [IU] via SUBCUTANEOUS
  Administered 2020-11-22: 1 [IU] via SUBCUTANEOUS
  Administered 2020-11-23: 18:00:00 5 [IU] via SUBCUTANEOUS
  Administered 2020-11-23 – 2020-11-24 (×2): 2 [IU] via SUBCUTANEOUS
  Administered 2020-11-24: 12:00:00 5 [IU] via SUBCUTANEOUS
  Administered 2020-11-24: 06:00:00 2 [IU] via SUBCUTANEOUS
  Administered 2020-11-24: 18:00:00 1 [IU] via SUBCUTANEOUS
  Administered 2020-11-25 (×2): 2 [IU] via SUBCUTANEOUS
  Administered 2020-11-25 (×2): 1 [IU] via SUBCUTANEOUS
  Administered 2020-11-26: 2 [IU] via SUBCUTANEOUS
  Administered 2020-11-26: 19:00:00 1 [IU] via SUBCUTANEOUS
  Administered 2020-11-26 (×2): 2 [IU] via SUBCUTANEOUS
  Administered 2020-11-27: 18:00:00 1 [IU] via SUBCUTANEOUS
  Administered 2020-11-27 – 2020-11-28 (×5): 2 [IU] via SUBCUTANEOUS

## 2020-11-19 MED ORDER — LACTATED RINGERS IV BOLUS
500.0000 mL | Freq: Once | INTRAVENOUS | Status: AC
  Administered 2020-11-19: 500 mL via INTRAVENOUS

## 2020-11-19 NOTE — Progress Notes (Signed)
Contacted E-link to clarify Albumin order. Scheduled for 0000 11/20/2020. Meant to be given this shift. Will continue to monitor.

## 2020-11-19 NOTE — Progress Notes (Addendum)
PHARMACY - TOTAL PARENTERAL NUTRITION CONSULT NOTE  Indication: Small bowel obstruction  Patient Measurements: Height: 5\' 11"  (180.3 cm) Weight: 61.2 kg (134 lb 14.7 oz) IBW/kg (Calculated) : 75.3 TPN AdjBW (KG): 60.8 Body mass index is 18.82 kg/m. Usual Weight: 137 lbs  Assessment:  74 YOM presented on 12/02/2020 with nausea, vomiting, abdominal pain after leaving AMA at [facility] with known SBO.  Patient has been on bowel rest with NG tube decompression.  Patient continued to have persistent SBO on imaging with no evidence of improvement, now s/p ex-lap with LoA, SBR and placement of Gtube on 11/24/2020.  Pharmacy consulted for TPN management.  Expect prolonged ileus.  Glucose / Insulin: no hx DM - AM glucose < 180 Electrolytes: low CO2, CoCa slightly low at 8.34, Mag 1.7 post 1gm (goal >/= 2 for ileus), repeat phos 5.6 (Ca x Phos = 47, goal < 55) Renal: AKI - SCr up 2.08 (BL 0.9-1.1), BUN WNL LFTs / TGs: LFTs WNL, tbili 2.7, TG WNL (ILE 15% of kCal d/t shortage) Prealbumin / albumin: BL prealbumin low at 6.7, albumin 2.7 Intake / Output; MIVF: UOP 0.1 ml/kg/hr (down), LR at 114ml/hr with 534mL bolus, net +4.9L GI Imaging: none since TPN start Surgeries / Procedures: none since TPN start  Central access: PICC placed 11/25/2020 TPN start date: 11/19/20   Nutritional Goals (pre RD rec 12/7): 2100-2300 kCal and 120-135gm protein per day  Current Nutrition:  NPO  Off Propofol   Plan:  Initiate TPN at 45 ml/hr at 1800 (goal rate 90 ml/hr) TPN will provide 62g AA, 194g CHO and 16g ILE for a total of 1064 kCal, meeting ~50% of needs Electrolytes in TPN: Na 56mEq/L, K 31mEq/L in setting of AKI, Ca 36mEq/L, Mag 11mEq/L, no Phos, max acetate Daily multivitamin and trace elements in TPN Add folate 1mg  to daily TPN since on folate supplementation PTA Initiate sensitive SSI Q6H Continue LR at 100 ml/hr per MD Mag sulfate 2gm IV x 1 Ca gluconate 1gm IV x 1 F/U AM labs   Add Zosyn 3.375gm IV for  intra-abdominal coverage   Min Collymore D. Mina Marble, PharmD, BCPS, [place] 11/19/2020, 10:05 AM

## 2020-11-19 NOTE — Progress Notes (Signed)
1 Day Post-Op  Subjective: Patient on vent today.  Sedated but does purposefully move his arm towards his ETT at times.  Appreciate WOC help with urostomy pouch.  NGT output as expected.  Minimal urine output overnight.  Bronch yesterday with a fair amount of white secretions that were removed.  Did not look infectious.  Ventilating better today.  ROS: See above, otherwise other systems negative  Objective: Vital signs in last 24 hours: Temp:  [97.6 F (36.4 C)-98.8 F (37.1 C)] 97.8 F (36.6 C) (12/08 0800) Pulse Rate:  [47-122] 103 (12/08 0700) Resp:  [13-39] 22 (12/08 0700) BP: (85-168)/(57-99) 93/73 (12/08 0700) SpO2:  [89 %-100 %] 99 % (12/08 0818) Arterial Line BP: (76-210)/(41-95) 105/58 (12/08 0700) FiO2 (%):  [40 %-100 %] 40 % (12/08 0818) Weight:  [60 kg] 60 kg (12/08 0500) Last BM Date: 11/25/2020  Intake/Output from previous day: 12/07 0701 - 12/08 0700 In: 8417.8 [I.V.:6613; IV Piggyback:1804.7] Out: 1560 [Urine:160; Emesis/NG output:400; Blood:500] Intake/Output this shift: No intake/output data recorded.  PE: Gen: on vent Heart: mildly tachy Lungs: CTAB Abd: soft, but distended, no BS, NGT with brown/bilious output, g-tube in place and clamped.  Midline wound is clean and packed.  RLQ urostomy pouch just changed.  No urine currently.  Stoma is pink and viable  Lab Results:  Recent Labs    11/25/2020 1748 11/19/20 0526  WBC 9.3 31.4*  HGB 14.1 11.7*  HCT 42.6 34.8*  PLT 204 159   BMET Recent Labs    11/23/2020 1748 11/19/20 0526  NA 139 139  K 3.4* 4.3  CL 110 109  CO2 18* 15*  GLUCOSE 128* 117*  BUN 17 21  CREATININE 1.31* 2.08*  CALCIUM 7.5* 7.3*   PT/INR No results for input(s): LABPROT, INR in the last 72 hours. CMP     Component Value Date/Time   NA 139 11/19/2020 0526   NA 145 03/08/2016 1356   K 4.3 11/19/2020 0526   K 4.5 03/08/2016 1356   CL 109 11/19/2020 0526   CO2 15 (L) 11/19/2020 0526   CO2 28 03/08/2016 1356   GLUCOSE  117 (H) 11/19/2020 0526   GLUCOSE 98 03/08/2016 1356   BUN 21 11/19/2020 0526   BUN 14.2 03/08/2016 1356   CREATININE 2.08 (H) 11/19/2020 0526   CREATININE 0.9 03/08/2016 1356   CALCIUM 7.3 (L) 11/19/2020 0526   CALCIUM 9.8 03/08/2016 1356   PROT 4.4 (L) 11/19/2020 0526   PROT 8.2 03/08/2016 1356   ALBUMIN 2.7 (L) 11/19/2020 0526   ALBUMIN 3.6 03/08/2016 1356   AST 24 11/19/2020 0526   AST 42 (H) 03/08/2016 1356   ALT 11 11/19/2020 0526   ALT 43 03/08/2016 1356   ALKPHOS 27 (L) 11/19/2020 0526   ALKPHOS 79 03/08/2016 1356   BILITOT 2.7 (H) 11/19/2020 0526   BILITOT 0.74 03/08/2016 1356   GFRNONAA 33 (L) 11/19/2020 0526   GFRAA >60 12/31/2019 0221   Lipase     Component Value Date/Time   LIPASE 57 (H) 12/07/2020 1830       Studies/Results: DG CHEST PORT 1 VIEW  Result Date: 12/12/2020 CLINICAL DATA:  74 year old male with hypoxia. Abnormal right lung on portable chest earlier today. EXAM: PORTABLE CHEST 1 VIEW COMPARISON:  1423 hours today and earlier. FINDINGS: Portable AP semi upright view at 1817 hours. Improved right lung volume and ventilation from earlier. Endotracheal tube tip remains just below the clavicles. Enteric tube courses to the abdomen, tip not included.  Residual veiling opacity in the right lower lung. Allowing for portable technique left lung remains clear. New left upper extremity approach PICC line in place, tip at the lower SVC level below the carina. IMPRESSION: 1. Improved right lung ventilation probably due to resolved mucous plugging from 1423 hours today. Residual veiling right pleural effusion suspected. 2. New left upper extremity approach PICC line with tip at the lower SVC level. Otherwise stable lines and tubes. 3. Left lung remains clear. Electronically Signed   By: Genevie Ann M.D.   On: 11/24/2020 18:54   DG CHEST PORT 1 VIEW  Result Date: 11/17/2020 CLINICAL DATA:  Respiratory failure EXAM: PORTABLE CHEST 1 VIEW COMPARISON:  CT abdomen 11/13/2020,  chest x-ray 12/20/2019 FINDINGS: Endotracheal tube tip is about 4.6 cm superior to the carina. Esophageal tube tip below the diaphragm but incompletely visualized. Diffuse opacity in the right thorax with mild shift of mediastinal contents to the right, suspect combination of pleural effusion, atelectasis and volume loss. Left lung is grossly clear. The cardiomediastinal silhouette is obscured. IMPRESSION: 1. Endotracheal tube tip about 4.6 cm superior to the carina. Esophageal tube tip below the diaphragm, incompletely visualized. 2. Diffuse opacity in the right thorax with shift of mediastinal contents to the right, suspect combination of pleural effusion, atelectasis and volume loss. A central obstructing process could also produce this appearance and chest CT follow-up may be considered. Electronically Signed   By: Donavan Foil M.D.   On: 11/25/2020 16:22   [place] EKG SITE RITE  Result Date: 11/16/2020 If Site Rite image not attached, placement could not be confirmed due to current cardiac rhythm.   Anti-infectives: Anti-infectives (From admission, onward)   Start     Dose/Rate Route Frequency Ordered Stop   11/19/20 0915  Ampicillin-Sulbactam (UNASYN) 3 g in sodium chloride 0.9 % 100 mL IVPB        3 g 200 mL/hr over 30 Minutes Intravenous Every 12 hours 11/19/20 0826         Assessment/Plan PMH bladder cancer s/p cystectomy and ileal conduit AKI on CKD - Creatinine up to 2.08 today with decrease UOP.  Per CCM, got some albumin overnight PMH CVA - hold plavix HTN Tobacco abuse  Hypotension intra-op - pressors off today SPCM - prealbumin is 6.7.  TNa written for yesterday, should start today VDRF - per CCM.  Mucous plugging resolved after bronch post op  POD 1, s/p ex lap with SBR, placement of a gastrostomy tube for SBO, Dr. Georgette Dover 12/7 -no stool softeners for now while patient is immediately post op and has an ileus -cont NGT and await bowel function.  Anticipate prolonged post op  ileus given nature of surgery -BID dressing changes to midline wound -g-tube clamped for now, but may need to use eventually for decompression or for nutrition -WBC 31K today, not surprising after events of yesterday.  Zosyn in place.  Will have pharmacy dose based on kidney function.   -labs in am -spoke to sister, Helene Kelp, to give an update.    FEN - NPO/NGT/TNA VTE - heparin ID - zosyn   LOS: 7 days    Henreitta Cea , [facility] 11/19/2020, 9:41 AM Please see Amion for pager number during day hours 7:00am-4:30pm or 7:00am -11:30am on weekends

## 2020-11-19 NOTE — Progress Notes (Signed)
E-Link notified of low urine output via urostomy. Will continue to monitor.

## 2020-11-19 NOTE — Progress Notes (Signed)
Nutrition Follow-up  DOCUMENTATION CODES:   Severe malnutrition in context of chronic illness  INTERVENTION:   TPN advancement as able to meet nutrition goals  As able initiate Pivot 1.5 @ 10 ml/hr via g-tube TF goal: Pivot 1.5 @ 60 ml/hr (2160 kcal and 135 grams protein)    Recommend monitor for refeeding syndrome given severe malnutrition.   NUTRITION DIAGNOSIS:   Severe Malnutrition related to chronic illness as evidenced by severe fat depletion, severe muscle depletion. Ongoing.   GOAL:   Patient will meet greater than or equal to 90% of their needs Progressing.   MONITOR:   I & O's  REASON FOR ASSESSMENT:   NPO/Clear Liquid Diet    ASSESSMENT:   74 year old male with history of cognitive impairment, CVA, HTN, bladder cancer now with urostomy, right-sided glottic carcinoma s/p excision brought to ED with nausea, vomiting and abdominal pain.  Work-up in ED revealed small bowel obstruction.  He also had AKI and lactic acidosis.  Started on bowel rest with NG tube decompression and IV fluid.  General surgery following.  Subsequent x-rays with persistent SBO.  Pt discussed during ICU rounds and with RN. Per surgery start TPN  Pt meets criteria for severe malnutrition.   12/7 s/p ex lap, extensive LOA, SBR, g-tube placement; s/p bronch   Patient is currently intubated on ventilator support MV: 10.9 L/min Temp (24hrs), Avg:98.3 F (36.8 C), Min:97.6 F (36.4 C), Max:99.1 F (37.3 C)  Medications reviewed and include: SSI Precedex   Labs reviewed: TG: 63 16 F NG tube  - 400 ml output  18 F G-tube; clamped  NUTRITION - FOCUSED PHYSICAL EXAM:    Most Recent Value  Orbital Region Severe depletion  Upper Arm Region Severe depletion  Thoracic and Lumbar Region Severe depletion  Buccal Region Severe depletion  Temple Region Severe depletion  Clavicle Bone Region Severe depletion  Clavicle and Acromion Bone Region Severe depletion  Scapular Bone Region  Severe depletion  Dorsal Hand Severe depletion  Patellar Region Severe depletion  Anterior Thigh Region Severe depletion  Posterior Calf Region Severe depletion  Edema (RD Assessment) None  Hair Reviewed  Eyes Unable to assess  Mouth Unable to assess  Skin Reviewed  Nails Reviewed       Diet Order:   Diet Order            Diet NPO time specified  Diet effective now                 EDUCATION NEEDS:   No education needs have been identified at this time  Skin:  Skin Assessment: Reviewed RN Assessment  Last BM:  12/7  Height:   Ht Readings from Last 1 Encounters:  11/26/2020 5\' 11"  (1.803 m)    Weight:   Wt Readings from Last 1 Encounters:  11/19/20 60 kg    Ideal Body Weight:  78.2 kg  BMI:  Body mass index is 18.45 kg/m.  Estimated Nutritional Needs:   Kcal:  2100-2300  Protein:  120-135 grams  Fluid:  > 2 L  Marcanthony Sleight P., RD, LDN, CNSC See AMiON for contact information

## 2020-11-19 NOTE — Progress Notes (Signed)
eLink Physician-Brief Progress Note Patient Name: Ronald Wolf DOB: [birth year] MRN: [id number]   Date of Service  11/19/2020  HPI/Events of Note  Patient with low urine output from his urostomy , BNP 79.5.  eICU Interventions  LR 500 ml iv fluid bolus x 1 ordered        [name] U Moshe Wenger 11/19/2020, 6:33 AM

## 2020-11-19 NOTE — Consult Note (Signed)
[place] Nurse ostomy follow up Patient receiving care in [facility] 4N30.  Stoma type/location: RUQ urostomy Stomal assessment/size: pink, moist, producing urine Peristomal assessment: intact Treatment options for stomal/peristomal skin: barrier ring Output: lightly orange tinged urine  Ostomy pouching: 1pc./2pc.  Education provided: none Patient used a [employer] manager.  I have ordered 2 and 1/4 inch two piece pouching system:  Have the following supplies at bedside for urostomy care:  Roselee Culver #[id number]; skin barrier, Kellie Simmering #[id number], barrier rings, Kellie Simmering [phone]; and adapter, Kellie Simmering [phone].  I removed the fecal pouch placed in OR yesterday, and placed a one piece convex pouch with barrier ring.  The patient does not need convexity.  I have requested the above items be ordered by the [place]. I gave the primary RN a short inservice on use of a urostomy pouch with adapter to connect to bedside drainage bag. Thank you for the consult.  Discussed plan of care with the bedside nurse.  [place] nurse will not follow at this time.  Please re-consult the [place] team if needed.  Val Riles, RN, MSN, CWOCN, CNS-BC, pager [phone]

## 2020-11-19 NOTE — Progress Notes (Signed)
NAME:  Ronald Wolf, MRN:  [id number], DOB:  [birth year], LOS: 7 ADMISSION DATE:  11/23/2020, CONSULTATION DATE:  11/15/2020 REFERRING MD: Cyndia Skeeters CHIEF COMPLAINT:  hypotension   Brief History   Ronald Wolf is a 74 yo M w/ PMH of CVA, Seizures, Dementia, Bladder ca s/p urostomy, tobacco use, right glottic carcinoma s/p excision presenting with sbo. Found to have ischemic bowel requiring ex-lap. PCCM consulted due to post-op hypotension / respiratory distress  History of present illness   Ronald Wolf is a 74 yo M w/ above PMH who initially presented to [facility] hospital on 11/11/20 with abdominal pain and nausea of 4-5 day duration. He was found to have SBO but left against medical advice as his family did not feel comfortable with him receiving his medical care in [place]. He came to [facility] ED the next day with similar complaint and was found to have SBO. He was treated with supportive care with bowel rest and ng suction. However his symptoms did not improve. Surgery team was consulted and he was brough to OR for exploratory laparoscopy. He was found to have multiple sections of ischemic bowel which were resected. He was unable to be extubated post-operatively and PCCM was consulted.  Past Medical History  CVA, Dementia, Bladder CA s/p nephrostomy, tobacco use, right glottic carcinoma s/p excision, seizures.  Significant Hospital Events   11/21/2020 Admission 11/15/2020 OR 12/01/2020 Admit to ICU  Consults:  General Surgery  Procedures:  11/20/2020 Ex-lap, G-tube placement  Significant Diagnostic Tests:   CT abd/pelvis 12/2 > High-grade distal small bowel obstruction, possibly due to adhesion. Mild ascites and diffuse mesenteric edema. No evidence of focal inflammatory process or abscess. Small bilateral pleural effusions and mild right lower lobe Atelectasis.  Chest X-Ray 12/2 > Right hemithorax   Micro Data:  11/25/2020 COVID/Flu negative  Antimicrobials:  N/A  Interim  history/subjective:  No acute events overnight  Better ventilation and oxygenation post bronch with mucus plug removal  WBC and low grade fever post operatively   Objective   Blood pressure 94/66, pulse (!) 102, temperature 98.8 F (37.1 C), temperature source Axillary, resp. rate (!) 25, height 5\' 11"  (1.803 m), weight 60 kg, SpO2 98 %.    Vent Mode: PRVC FiO2 (%):  [40 %-100 %] 40 % Set Rate:  [16 bmp] 16 bmp Vt Set:  [600 mL] 600 mL PEEP:  [5 cmH20-8 cmH20] 8 cmH20 Plateau Pressure:  [16 cmH20-23 cmH20] 23 cmH20   Intake/Output Summary (Last 24 hours) at 11/19/2020 0713 Last data filed at 11/19/2020 0600 Gross per 24 hour  Intake 8315.33 ml  Output 1560 ml  Net 6755.33 ml   Filed Weights   11/16/20 0516 11/17/20 0500 11/19/20 0500  Weight: 61.6 kg 61.2 kg 60 kg    Examination: General: Chronically ill appearing elderly thin deconditioned elderly male on mechanical ventilation, in NAD HEENT: ETT, MM pink/moist, PERRL,  Neuro: Will open eyes to painful stimuli  CV: s1s2 regular rate and rhythm, no murmur, rubs, or gallops,  PULM:  Clear to ascultation, no added breath sounds, no increased work of breathing, tolerating vent well  GI: soft, bowel sounds active in all 4 quadrants, non-tender, non-distended Extremities: warm/dry, no edema  Skin: no rashes or lesions  Resolved Hospital Problem list   N/A  Assessment & Plan:   Acute Hypoxic Respiratory Failure s -Patient continued to require intubation post operatively, unable to be weaned off of the vent. Chest X-ray performed, revealing right sided hemithorax. -Bronch done  12/7 with mucus plugging seen P: Continue ventilator support with lung protective strategies  Wean PEEP and FiO2 for sats greater than 90%. Head of bed elevated 30 degrees. Plateau pressures less than 30 cm H20.  Follow intermittent chest x-ray and ABG.   SAT/SBT as tolerated, mentation preclude extubation  Ensure adequate pulmonary hygiene  Follow  cultures  VAP bundle in place  PAD protocol  Small bowel obstruction complicated by ischemic bowel with perforation s/p ex-lap with anastomosis and G tube placement. -Present with SBO. Failed conservative measures. Ex-lap 12/7 with findings of ischemic bowel s/p resection and G-tube placement. Patient requiring IV fluids as well as pressors due to hypotension.  P: Primary management per surgery  Continue IVF for now as we are unable to use GI tract PICC in place may start TPN today  Supportive care   Acute Kidney Injury  -in the setting of bowel perforation Baseline creatinine 1.3-14, creatinine on admission 2.46 and improved with IV hydration but has since rose again post surgery  P: Follow renal function / urine output Trend Bmet Avoid nephrotoxins Ensure adequate renal perfusion  IV hydration Renally dose medications   Seizure Disorder -Has hx of epilepsy confirmed via EEG 12/24/19. On levetiracetam at home. P: Renally dose Keppra  Seizure precautions   Hx of Bladder Cancer -Bladder cancer s/p urostomy placement P: Urostomy in place  Monitor urine output closely   Best practice (evaluated daily)   Diet: NPO, TPN management per pharmacy Pain/Anxiety/Delirium protocol (if indicated): Fentanyl VAP protocol (if indicated): Y DVT prophylaxis: SCDs GI prophylaxis: PPI Glucose control: SSI Mobility: N/A last date of multidisciplinary goals of care discussionN/A Family and staff present N/A Summary of discussion N/A Follow up goals of care discussion due 11/19/20 Code Status: Full Disposition: ICU  Labs   CBC: Recent Labs  Lab 11/29/2020 1830 12/03/2020 1830 11/13/20 0356 11/13/20 0356 11/14/20 0647 11/14/20 0647 11/17/20 0431 11/25/2020 0340 11/23/2020 1658 12/08/2020 1748 11/19/20 0526  WBC 9.4   < > 7.6  --  6.4  --  7.9  --   --  9.3 31.4*  NEUTROABS 7.2  --   --   --   --   --   --   --   --   --  26.4*  HGB 14.6   < > 11.7*   < > 11.6*   < > 11.5* 11.0* 11.9*  14.1 11.7*  HCT 46.1   < > 35.8*   < > 35.4*   < > 35.3* 33.1* 35.0* 42.6 34.8*  MCV 108.5*   < > 104.4*  --  105.4*  --  101.7*  --   --  94.7 96.7  PLT 389   < > 281  --  293  --  302  --   --  204 159   < > = values in this interval not displayed.    Basic Metabolic Panel: Recent Labs  Lab 11/13/20 0356 11/14/20 0647 11/15/20 0314 11/15/20 0314 11/16/20 0309 11/16/20 0309 11/17/20 0431 11/17/2020 0340 12/07/2020 1658 11/21/2020 1748 11/19/20 0526  NA 151*   < > 142   < > 137   < > 134* 141 142 139 139  K 3.9   < > 3.7   < > 4.3   < > 4.1 3.9 3.3* 3.4* 4.3  CL 119*   < > 109   < > 104  --  100 106  --  110 109  CO2 17*   < >  22   < > 22  --  24 25  --  18* 15*  GLUCOSE 119*   < > 138*   < > 134*  --  133* 131*  --  128* 117*  BUN 52*   < > 28*   < > 26*  --  20 14  --  17 21  CREATININE 2.08*   < > 1.42*   < > 1.47*  --  1.33* 1.24  --  1.31* 2.08*  CALCIUM 8.2*   < > 8.7*   < > 8.6*  --  8.4* 8.1*  --  7.5* 7.3*  MG 1.9  --   --   --   --   --  1.8 1.8  --   --  1.7  PHOS 2.6   < > 1.5*  --  1.8*  --  1.8* 2.8  --   --  6.3*   < > = values in this interval not displayed.   GFR: Estimated Creatinine Clearance: 26.4 mL/min (A) (by C-G formula based on SCr of 2.08 mg/dL (H)). Recent Labs  Lab 11/13/20 0356 11/13/20 0602 11/14/20 0647 11/17/20 0431 11/25/2020 1748 11/20/2020 2109 11/19/20 0526  WBC   < >  --  6.4 7.9 9.3  --  31.4*  LATICACIDVEN  --  2.0* 1.4  --  2.6* 3.1*  --    < > = values in this interval not displayed.    Liver Function Tests: Recent Labs  Lab 11/22/2020 1830 11/13/20 0356 11/15/20 0314 11/16/20 0309 11/17/20 0431 12/07/2020 0340 11/19/20 0526  AST 22  --   --   --  17  --  24  ALT 18  --   --   --  15  --  11  ALKPHOS 73  --   --   --  46  --  27*  BILITOT 0.8  --   --   --  1.2  --  2.7*  PROT 7.1  --   --   --  5.6*  --  4.4*  ALBUMIN 3.4*   < > 2.6* 2.6* 2.7* 2.5* 2.7*   < > = values in this interval not displayed.   Recent Labs  Lab  11/19/2020 1830  LIPASE 57*   No results for input(s): AMMONIA in the last 168 hours.  ABG    Component Value Date/Time   PHART 7.428 11/12/2020 1658   PCO2ART 32.9 12/07/2020 1658   PO2ART 408 (H) 11/30/2020 1658   HCO3 21.7 12/01/2020 1658   TCO2 23 11/24/2020 1658   ACIDBASEDEF 2.0 11/23/2020 1658   O2SAT 100.0 11/29/2020 1658     Coagulation Profile: No results for input(s): INR, PROTIME in the last 168 hours.  Cardiac Enzymes: No results for input(s): CKTOTAL, CKMB, CKMBINDEX, TROPONINI in the last 168 hours.  HbA1C: Hgb A1c MFr Bld  Date/Time Value Ref Range Status  12/17/2019 08:19 AM 4.5 (L) 4.8 - 5.6 % Final    Comment:    (NOTE) Pre diabetes:          5.7%-6.4% Diabetes:              >6.4% Glycemic control for   <7.0% adults with diabetes   12/18/2018 03:22 AM 4.2 (L) 4.8 - 5.6 % Final    Comment:    (NOTE) Pre diabetes:          5.7%-6.4% Diabetes:              >  6.4% Glycemic control for   <7.0% adults with diabetes     CBG: No results for input(s): GLUCAP in the last 168 hours.  CRITICAL CARE Performed by: Johnsie Cancel   Total critical care time: 38 minutes  Critical care time was exclusive of separately billable procedures and treating other patients.  Critical care was necessary to treat or prevent imminent or life-threatening deterioration.  Critical care was time spent personally by me on the following activities: development of treatment plan with patient and/or surrogate as well as nursing, discussions with consultants, evaluation of patient's response to treatment, examination of patient, obtaining history from patient or surrogate, ordering and performing treatments and interventions, ordering and review of laboratory studies, ordering and review of radiographic studies, pulse oximetry and re-evaluation of patient's condition.  Johnsie Cancel, NP-C [facility] Contact / Pager information can be found on Amion   11/19/2020, 7:17 AM

## 2020-11-20 ENCOUNTER — Other Ambulatory Visit: Payer: Self-pay

## 2020-11-20 DIAGNOSIS — R0902 Hypoxemia: Secondary | ICD-10-CM

## 2020-11-20 LAB — GLUCOSE, CAPILLARY
Glucose-Capillary: 148 mg/dL — ABNORMAL HIGH (ref 70–99)
Glucose-Capillary: 133 mg/dL — ABNORMAL HIGH (ref 70–99)
Glucose-Capillary: 95 mg/dL (ref 70–99)
Glucose-Capillary: 107 mg/dL — ABNORMAL HIGH (ref 70–99)

## 2020-11-20 LAB — COMPREHENSIVE METABOLIC PANEL
ALT: 12 U/L (ref 0–44)
AST: 26 U/L (ref 15–41)
Albumin: 1.7 g/dL — ABNORMAL LOW (ref 3.5–5.0)
Alkaline Phosphatase: 30 U/L — ABNORMAL LOW (ref 38–126)
Anion gap: 11 (ref 5–15)
BUN: 34 mg/dL — ABNORMAL HIGH (ref 8–23)
CO2: 21 mmol/L — ABNORMAL LOW (ref 22–32)
Calcium: 7 mg/dL — ABNORMAL LOW (ref 8.9–10.3)
Chloride: 108 mmol/L (ref 98–111)
Creatinine, Ser: 1.86 mg/dL — ABNORMAL HIGH (ref 0.61–1.24)
GFR, Estimated: 38 mL/min — ABNORMAL LOW (ref >60)
Glucose, Bld: 176 mg/dL — ABNORMAL HIGH (ref 70–99)
Potassium: 3.3 mmol/L — ABNORMAL LOW (ref 3.5–5.1)
Sodium: 140 mmol/L (ref 135–145)
Total Bilirubin: 2.2 mg/dL — ABNORMAL HIGH (ref 0.3–1.2)
Total Protein: 3.6 g/dL — ABNORMAL LOW (ref 6.5–8.1)

## 2020-11-20 LAB — CBC
HCT: 28.8 % — ABNORMAL LOW (ref 39.0–52.0)
Hemoglobin: 10 g/dL — ABNORMAL LOW (ref 13.0–17.0)
MCH: 33.3 pg (ref 26.0–34.0)
MCHC: 34.7 g/dL (ref 30.0–36.0)
MCV: 96 fL (ref 80.0–100.0)
Platelets: 130 10*3/uL — ABNORMAL LOW (ref 150–400)
RBC: 3 MIL/uL — ABNORMAL LOW (ref 4.22–5.81)
RDW: 15.3 % (ref 11.5–15.5)
WBC: 27.7 10*3/uL — ABNORMAL HIGH (ref 4.0–10.5)
nRBC: 0.1 % (ref 0.0–0.2)

## 2020-11-20 LAB — MAGNESIUM: Magnesium: 2.1 mg/dL (ref 1.7–2.4)

## 2020-11-20 LAB — PHOSPHORUS: Phosphorus: 2.8 mg/dL (ref 2.5–4.6)

## 2020-11-20 MED ORDER — POTASSIUM CHLORIDE 10 MEQ/50ML IV SOLN
10.0000 meq | INTRAVENOUS | Status: AC
  Administered 2020-11-20 (×3): 10 meq via INTRAVENOUS
  Filled 2020-11-20 (×4): qty 50

## 2020-11-20 MED ORDER — TRAVASOL 10 % IV SOLN
INTRAVENOUS | Status: AC
  Filled 2020-11-20: qty 820.8

## 2020-11-20 MED ORDER — SODIUM CHLORIDE 0.9 % IV SOLN
750.0000 mg | Freq: Two times a day (BID) | INTRAVENOUS | Status: DC
  Administered 2020-11-20 – 2020-11-30 (×20): 750 mg via INTRAVENOUS
  Filled 2020-11-20 (×23): qty 7.5

## 2020-11-20 MED ORDER — POTASSIUM PHOSPHATES 15 MMOLE/5ML IV SOLN
10.0000 mmol | Freq: Once | INTRAVENOUS | Status: AC
  Administered 2020-11-20: 10 mmol via INTRAVENOUS
  Filled 2020-11-20: qty 3.33

## 2020-11-20 NOTE — Progress Notes (Signed)
NAME:  Ronald Wolf, MRN:  [id number], DOB:  05-19-1946, LOS: 8 ADMISSION DATE:  11/21/2020, CONSULTATION DATE:  11/15/2020 REFERRING MD: Cyndia Skeeters CHIEF COMPLAINT:  hypotension   Brief History   Ronald Wolf is a 74 yo M w/ PMH of CVA, Seizures, Dementia, Bladder ca s/p urostomy, tobacco use, right glottic carcinoma s/p excision presenting with sbo. Found to have ischemic bowel requiring ex-lap. PCCM consulted due to post-op hypotension / respiratory distress  History of present illness   Ronald Wolf is a 74 yo M w/ above PMH who initially presented to [facility] hospital on 11/11/20 with abdominal pain and nausea of 4-5 day duration. He was found to have SBO but left against medical advice as his family did not feel comfortable with him receiving his medical care in [place]. He came to [facility] ED the next day with similar complaint and was found to have SBO. He was treated with supportive care with bowel rest and ng suction. However his symptoms did not improve. Surgery team was consulted and he was brough to OR for exploratory laparoscopy. He was found to have multiple sections of ischemic bowel which were resected. He was unable to be extubated post-operatively and PCCM was consulted.  Past Medical History  CVA, Dementia, Bladder CA s/p nephrostomy, tobacco use, right glottic carcinoma s/p excision, seizures.  Significant Hospital Events   12/07/2020 Admission 11/22/2020 OR 12/12/2020 Admit to ICU  Consults:  General Surgery PCCM Procedures:  11/24/2020 Ex-lap, G-tube placement  Significant Diagnostic Tests:   CT abd/pelvis 12/2 > High-grade distal small bowel obstruction, possibly due to adhesion. Mild ascites and diffuse mesenteric edema. No evidence of focal inflammatory process or abscess. Small bilateral pleural effusions and mild right lower lobe Atelectasis.  Chest X-Ray 12/2 > Right hemithorax   Micro Data:  12/09/2020 COVID/Flu negative  Antimicrobials:  Fluc 12/8>> Zosyn 12/8>>    Interim history/subjective:  Minimal vent settings, PSV/CPAP Cr downtrending  Intermittently follow weak commands   Objective   Blood pressure 112/69, pulse 93, temperature 99.5 F (37.5 C), temperature source Axillary, resp. rate (!) 21, height 5\' 11"  (1.803 m), weight 66.7 kg, SpO2 100 %.    Vent Mode: PRVC FiO2 (%):  [30 %-40 %] 30 % Set Rate:  [15 bmp-16 bmp] 15 bmp Vt Set:  [600 mL] 600 mL PEEP:  [5 cmH20] 5 cmH20 Plateau Pressure:  [20 cmH20-24 cmH20] 24 cmH20   Intake/Output Summary (Last 24 hours) at 11/20/2020 0727 Last data filed at 11/20/2020 0700 Gross per 24 hour  Intake 3855.24 ml  Output 755 ml  Net 3100.24 ml   Filed Weights   11/17/20 0500 11/19/20 0500 11/20/20 0500  Weight: 61.2 kg 60 kg 66.7 kg    Examination: General: Critically and chronically ill appearing older adult M. Intubated, lightly sedated NAD  HEENT: ETT secure. Anicteric sclera. NGT with feculent appearing output  Neuro: Grimaces to painful stimuli. Intermittently following commands weakly CV: rrr s1s2 no rgm cap refill < 3 sec  PULM:  CTAb symmetrical chest expansion, mechanically ventilated  GI: Distended, tender. Midline abdominal excision without purulent drainage. G tube without output to gravity.  GU: amber urine.  RUQ urostomy Extremities: Symmetrical bulk and tone no obvious deformity. No cyanosis or clubbing. No obvious joint deformity Skin: c/d/w without rash   Resolved Hospital Problem list   N/A  Assessment & Plan:   Encephalopathy  -Acute vs acute on possible chronic  -certainly at risk for ICU delirium, encephalopathy due to sepsis, CNS  depressing meds/anesthesia, AKI  P -minimize sedation as able -delirium precautions -correct metabolic abnormalities as able   Acute hypoxic respiratory failure requiring intubation  -Patient continued to require intubation post operatively, unable to be weaned off of the vent. Chest X-ray performed, revealing right sided  hemithorax. -Bronch done 12/7 with mucus plugging seen P: -WUA/SBT -Wean vent as able for SpO2 > 90 -VAP, PAD  -Mentation is barrier to extubation (it is unclear how much of this is acute vs chronic, and thus what a reasonable mentation target is) -- if we do not see improvement in mentation, might need to optimize as best we can from resp standpoint and consider extubation without optimized mental status. Will continue to assess   Small bowel obstruction complicated by ischemic bowel with perforation s/p ex-lap with anastomosis and G tube placement. -Present with SBO. Failed conservative measures. Ex-lap 12/7 with findings of ischemic bowel s/p resection and G-tube placement. Patient requiring IV fluids as well as pressors due to hypotension.   Shock- suspected septic shock with likely IAI in setting of bowel perf  P: -per CCS -TPN via PICC  -cont Fluc and Zosyn -NE for MAP goal > 65  -trend CBC   Acute Kidney Injury, improving  -in the setting of bowel perforation Baseline creatinine 1.3-14, creatinine on admission 2.46 and improved with IV hydration but has since rose again post surgery  P: -trend renal indices and UOP  -will wean mIVF from 115ml/hr to 7ml/hr -MAP goal > 65 for renal perfusion -appreciate pharmacist assistance with renal dosing of meds as indicated   Hypokalemia -replace, continue to trend   Seizure Disorder -Has hx of epilepsy confirmed via EEG 12/24/19. On levetiracetam at home. P: -Cont Keppra  Hx of Bladder Cancer -Bladder cancer s/p urostomy placement P: -urostomy  -trend UOP   Best practice (evaluated daily)   Diet: TPN  Pain/Anxiety/Delirium protocol (if indicated): Fentanyl, precedex  VAP protocol (if indicated): Yes  DVT prophylaxis: SCDs GI prophylaxis: PPI Glucose control: SSI Mobility:BR  last date of multidisciplinary goals of care discussion N/A Family and staff present N/A Summary of discussion N/A Follow up goals of care  discussion due - Family updated 12/8 [place] due 12/14 Code Status: Full Disposition: ICU  Labs   CBC: Recent Labs  Lab 11/14/20 0647 11/17/20 0431 12/05/2020 0340 12/07/2020 1658 11/13/2020 1748 11/19/20 0526  WBC 6.4 7.9  --   --  9.3 31.4*  NEUTROABS  --   --   --   --   --  26.4*  HGB 11.6* 11.5* 11.0* 11.9* 14.1 11.7*  HCT 35.4* 35.3* 33.1* 35.0* 42.6 34.8*  MCV 105.4* 101.7*  --   --  94.7 96.7  PLT 293 302  --   --  204 627    Basic Metabolic Panel: Recent Labs  Lab 11/17/20 0431 11/12/2020 0340 12/04/2020 1658 11/25/2020 1748 11/19/20 0526 11/19/20 0732 11/20/20 0458  NA 134* 141 142 139 139  --  140  K 4.1 3.9 3.3* 3.4* 4.3  --  3.3*  CL 100 106  --  110 109  --  108  CO2 24 25  --  18* 15*  --  21*  GLUCOSE 133* 131*  --  128* 117*  --  176*  BUN 20 14  --  17 21  --  34*  CREATININE 1.33* 1.24  --  1.31* 2.08*  --  1.86*  CALCIUM 8.4* 8.1*  --  7.5* 7.3*  --  7.0*  MG 1.8 1.8  --   --  1.7  --  2.1  PHOS 1.8* 2.8  --   --  6.3* 5.6* 2.8   GFR: Estimated Creatinine Clearance: 32.9 mL/min (A) (by C-G formula based on SCr of 1.86 mg/dL (H)). Recent Labs  Lab 11/14/20 0647 11/17/20 0431 12/02/2020 1748 12/02/2020 2109 11/19/20 0526 11/19/20 1037 11/19/20 1450  WBC 6.4 7.9 9.3  --  31.4*  --   --   LATICACIDVEN 1.4  --  2.6* 3.1*  --  3.5* 2.9*    Liver Function Tests: Recent Labs  Lab 11/16/20 0309 11/17/20 0431 11/25/2020 0340 11/19/20 0526 11/20/20 0458  AST  --  17  --  24 26  ALT  --  15  --  11 12  ALKPHOS  --  46  --  27* 30*  BILITOT  --  1.2  --  2.7* 2.2*  PROT  --  5.6*  --  4.4* 3.6*  ALBUMIN 2.6* 2.7* 2.5* 2.7* 1.7*   No results for input(s): LIPASE, AMYLASE in the last 168 hours. No results for input(s): AMMONIA in the last 168 hours.  ABG    Component Value Date/Time   PHART 7.428 11/24/2020 1658   PCO2ART 32.9 11/14/2020 1658   PO2ART 408 (H) 12/09/2020 1658   HCO3 21.7 11/15/2020 1658   TCO2 23 11/28/2020 1658   ACIDBASEDEF 2.0  11/21/2020 1658   O2SAT 100.0 11/17/2020 1658     Coagulation Profile: No results for input(s): INR, PROTIME in the last 168 hours.  Cardiac Enzymes: No results for input(s): CKTOTAL, CKMB, CKMBINDEX, TROPONINI in the last 168 hours.  HbA1C: Hgb A1c MFr Bld  Date/Time Value Ref Range Status  11/19/2020 10:37 AM 5.0 4.8 - 5.6 % Final    Comment:    (NOTE) Pre diabetes:          5.7%-6.4%  Diabetes:              >6.4%  Glycemic control for   <7.0% adults with diabetes   12/17/2019 08:19 AM 4.5 (L) 4.8 - 5.6 % Final    Comment:    (NOTE) Pre diabetes:          5.7%-6.4% Diabetes:              >6.4% Glycemic control for   <7.0% adults with diabetes     CBG: Recent Labs  Lab 11/19/20 1808 11/19/20 2350 11/20/20 0608  GLUCAP 84 148* 148*    CRITICAL CARE Performed by: Cristal Generous   Total critical care time: 38 minutes  Critical care time was exclusive of separately billable procedures and treating other patients. Critical care was necessary to treat or prevent imminent or life-threatening deterioration.  Critical care was time spent personally by me on the following activities: development of treatment plan with patient and/or surrogate as well as nursing, discussions with consultants, evaluation of patient's response to treatment, examination of patient, obtaining history from patient or surrogate, ordering and performing treatments and interventions, ordering and review of laboratory studies, ordering and review of radiographic studies, pulse oximetry and re-evaluation of patient's condition.   Eliseo Gum MSN, AGACNP-BC [place] [phone] If no answer, [phone] 11/20/2020, 7:27 AM

## 2020-11-20 NOTE — Progress Notes (Addendum)
PHARMACY - TOTAL PARENTERAL NUTRITION CONSULT NOTE  Indication: Small bowel obstruction  Patient Measurements: Height: 5\' 11"  (180.3 cm) Weight: 61.2 kg (134 lb 14.7 oz) IBW/kg (Calculated) : 75.3 TPN AdjBW (KG): 60.8 Body mass index is 18.82 kg/m. Usual Weight: 137 lbs  Assessment:  74 YOM presented on 12/07/2020 with nausea, vomiting, abdominal pain after leaving AMA at [facility] with known SBO.  Patient has been on bowel rest with NG tube decompression.  Patient continued to have persistent SBO on imaging with no evidence of improvement, now s/p ex-lap with LoA, SBR and placement of Gtube on 12/10/2020.  Pharmacy consulted for TPN management.  Expect prolonged ileus.  Glucose / Insulin: no hx DM - CBGs < 180.  Used 2 units SSI since TPN started. Electrolytes: K 4.3 > 3.3 (improving AKI vs some refeeding, goal >/= 4 for ileus), CO2 up to 21 with max acetate, Phos down to 2.8 (question accuracy of 11/8 labs), CoCa up to 8.84 post 1gm on 12/8, others WNL Renal: AKI - SCr down 1.86 (BL 0.9-1.1), BUN 34 LFTs / TGs: LFTs WNL, tbili down to 2.2, TG WNL (ILE 15% of kCal d/t shortage) Prealbumin / albumin: BL prealbumin low at 6.7, albumin 2.7 Intake / Output; MIVF: UOP 0.4 ml/kg/hr (up), NG 45mL, LR at 157ml/hr, net +8L GI Imaging: none since TPN start Surgeries / Procedures: none since TPN start  Central access: PICC placed 11/17/2020 TPN start date: 11/19/20   Nutritional Goals (pre RD rec 12/7): 2100-2300 kCal and 120-135gm protein per day  Current Nutrition:  TPN (Goal Pivot 1.5 = 60 ml/hr)  Plan:  Increase TPN conservatively to 60 ml/hr at 1800 (goal rate 90 ml/hr) TPN will provide 82g AA, 259g CHO and 21g ILE for a total of 1418 kCal, meeting ~65% of needs Electrolytes in TPN: Na 22mEq/L, incr K 61mEq/L, Ca 86mEq/L, Mag 27mEq/L, add Phos 15 mmol/L, max acetate Daily multivitamin and trace elements in TPN Add folate 1mg  to daily TPN since on folate supplementation PTA Continue sensitive  SSI Q6H Continue LR at 100 ml/hr per MD KCL x 3 runs KPhos 10 mmol IV x 1 F/U AM labs to advance TPN  Honor Frison D. Mina Marble, PharmD, BCPS, [place] 11/20/2020, 7:31 AM

## 2020-11-20 NOTE — Progress Notes (Signed)
2 Days Post-Op  Subjective: Patient awake at times on the vent.  Will respond appropriately to pain and intermittently follow some commands.  UOP somewhat better.  NGT with about 400-500cc of liquid stool present.  ROS: See above, otherwise other systems negative  Objective: Vital signs in last 24 hours: Temp:  [98.5 F (36.9 C)-99.5 F (37.5 C)] 99.2 F (37.3 C) (12/09 0800) Pulse Rate:  [87-107] 93 (12/09 0700) Resp:  [17-37] 21 (12/09 0700) BP: (84-191)/(49-102) 112/69 (12/09 0700) SpO2:  [97 %-100 %] 100 % (12/09 0722) Arterial Line BP: (75-232)/(41-79) 114/52 (12/09 0700) FiO2 (%):  [30 %-40 %] 40 % (12/09 0722) Weight:  [66.7 kg] 66.7 kg (12/09 0500) Last BM Date: 12/09/2020 (bm reported earlier today.)  Intake/Output from previous day: 12/08 0701 - 12/09 0700 In: 3855.2 [I.V.:3205.2; IV Piggyback:650] Out: 28 [Urine:705; Emesis/NG output:50] Intake/Output this shift: Total I/O In: 173.5 [I.V.:165.2; IV Piggyback:8.3] Out: -   PE: Gen: on vent, awakes some to pain and yelling his name Heart: regular Lungs: CTAB Abd: distended still, NGT with feculent output, g-tube put to gravity to see if that helps with decompression, midline wound is clean and intact, hypoactive to absent BS, grimaces to pain with palpation to abdomen.  Urostomy in place with some yellow urine output.  Lab Results:  Recent Labs    11/19/20 0526 11/20/20 0732  WBC 31.4* 27.7*  HGB 11.7* 10.0*  HCT 34.8* 28.8*  PLT 159 130*   BMET Recent Labs    11/19/20 0526 11/20/20 0458  NA 139 140  K 4.3 3.3*  CL 109 108  CO2 15* 21*  GLUCOSE 117* 176*  BUN 21 34*  CREATININE 2.08* 1.86*  CALCIUM 7.3* 7.0*   PT/INR No results for input(s): LABPROT, INR in the last 72 hours. CMP     Component Value Date/Time   NA 140 11/20/2020 0458   NA 145 03/08/2016 1356   K 3.3 (L) 11/20/2020 0458   K 4.5 03/08/2016 1356   CL 108 11/20/2020 0458   CO2 21 (L) 11/20/2020 0458   CO2 28 03/08/2016  1356   GLUCOSE 176 (H) 11/20/2020 0458   GLUCOSE 98 03/08/2016 1356   BUN 34 (H) 11/20/2020 0458   BUN 14.2 03/08/2016 1356   CREATININE 1.86 (H) 11/20/2020 0458   CREATININE 0.9 03/08/2016 1356   CALCIUM 7.0 (L) 11/20/2020 0458   CALCIUM 9.8 03/08/2016 1356   PROT 3.6 (L) 11/20/2020 0458   PROT 8.2 03/08/2016 1356   ALBUMIN 1.7 (L) 11/20/2020 0458   ALBUMIN 3.6 03/08/2016 1356   AST 26 11/20/2020 0458   AST 42 (H) 03/08/2016 1356   ALT 12 11/20/2020 0458   ALT 43 03/08/2016 1356   ALKPHOS 30 (L) 11/20/2020 0458   ALKPHOS 79 03/08/2016 1356   BILITOT 2.2 (H) 11/20/2020 0458   BILITOT 0.74 03/08/2016 1356   GFRNONAA 38 (L) 11/20/2020 0458   GFRAA >60 12/31/2019 0221   Lipase     Component Value Date/Time   LIPASE 57 (H) 11/24/2020 1830       Studies/Results: DG CHEST PORT 1 VIEW  Result Date: 11/16/2020 CLINICAL DATA:  74 year old male with hypoxia. Abnormal right lung on portable chest earlier today. EXAM: PORTABLE CHEST 1 VIEW COMPARISON:  1423 hours today and earlier. FINDINGS: Portable AP semi upright view at 1817 hours. Improved right lung volume and ventilation from earlier. Endotracheal tube tip remains just below the clavicles. Enteric tube courses to the abdomen, tip not included. Residual  veiling opacity in the right lower lung. Allowing for portable technique left lung remains clear. New left upper extremity approach PICC line in place, tip at the lower SVC level below the carina. IMPRESSION: 1. Improved right lung ventilation probably due to resolved mucous plugging from 1423 hours today. Residual veiling right pleural effusion suspected. 2. New left upper extremity approach PICC line with tip at the lower SVC level. Otherwise stable lines and tubes. 3. Left lung remains clear. Electronically Signed   By: Genevie Ann M.D.   On: 11/17/2020 18:54   DG CHEST PORT 1 VIEW  Result Date: 11/21/2020 CLINICAL DATA:  Respiratory failure EXAM: PORTABLE CHEST 1 VIEW COMPARISON:  CT  abdomen 11/13/2020, chest x-ray 12/20/2019 FINDINGS: Endotracheal tube tip is about 4.6 cm superior to the carina. Esophageal tube tip below the diaphragm but incompletely visualized. Diffuse opacity in the right thorax with mild shift of mediastinal contents to the right, suspect combination of pleural effusion, atelectasis and volume loss. Left lung is grossly clear. The cardiomediastinal silhouette is obscured. IMPRESSION: 1. Endotracheal tube tip about 4.6 cm superior to the carina. Esophageal tube tip below the diaphragm, incompletely visualized. 2. Diffuse opacity in the right thorax with shift of mediastinal contents to the right, suspect combination of pleural effusion, atelectasis and volume loss. A central obstructing process could also produce this appearance and chest CT follow-up may be considered. Electronically Signed   By: Donavan Foil M.D.   On: 12/04/2020 16:22    Anti-infectives: Anti-infectives (From admission, onward)   Start     Dose/Rate Route Frequency Ordered Stop   11/19/20 1130  piperacillin-tazobactam (ZOSYN) IVPB 3.375 g        3.375 g 12.5 mL/hr over 240 Minutes Intravenous Every 8 hours 11/19/20 1017     11/19/20 1115  fluconazole (DIFLUCAN) IVPB 200 mg        200 mg 100 mL/hr over 60 Minutes Intravenous Every 24 hours 11/19/20 1017 11/22/20 1114   11/19/20 0915  Ampicillin-Sulbactam (UNASYN) 3 g in sodium chloride 0.9 % 100 mL IVPB  Status:  Discontinued        3 g 200 mL/hr over 30 Minutes Intravenous Every 12 hours 11/19/20 0826 11/19/20 0949       Assessment/Plan PMH bladder cancer s/p cystectomy and ileal conduit AKI on CKD - Creatinine 1.86 down from 2.08. PMH CVA - hold plavix HTN Tobacco abuse  Hypotension intra-op - pressors off today SPCM - prealbumin is 6.7.  TNA written for yesterday, should start today VDRF - per CCM.  Mucous plugging resolved after bronch post op  POD 2, s/p ex lap with SBR, placement of a gastrostomy tube for SBO, Dr.  Georgette Dover 12/7 -cont NGT and await bowel function.  Anticipate prolonged post op ileus given nature of surgery and feculent drainage from NGT -BID dressing changes to midline wound -g-tube put to gravity to see if this will help with some decompression as he is still very distended.   -WBC down to 27K today from 31K.  FEN - NPO/NGT/TNA VTE - heparin ID - zosyn   LOS: 8 days    Henreitta Cea , [facility] 11/20/2020, 8:41 AM Please see Amion for pager number during day hours 7:00am-4:30pm or 7:00am -11:30am on weekends

## 2020-11-21 ENCOUNTER — Inpatient Hospital Stay (HOSPITAL_COMMUNITY): Payer: Medicare PPO

## 2020-11-21 DIAGNOSIS — E43 Unspecified severe protein-calorie malnutrition: Secondary | ICD-10-CM | POA: Insufficient documentation

## 2020-11-21 DIAGNOSIS — N179 Acute kidney failure, unspecified: Secondary | ICD-10-CM

## 2020-11-21 LAB — CULTURE, RESPIRATORY W GRAM STAIN: Culture: NORMAL

## 2020-11-21 LAB — BASIC METABOLIC PANEL
Anion gap: 10 (ref 5–15)
BUN: 43 mg/dL — ABNORMAL HIGH (ref 8–23)
CO2: 22 mmol/L (ref 22–32)
Calcium: 7 mg/dL — ABNORMAL LOW (ref 8.9–10.3)
Chloride: 108 mmol/L (ref 98–111)
Creatinine, Ser: 1.7 mg/dL — ABNORMAL HIGH (ref 0.61–1.24)
GFR, Estimated: 42 mL/min — ABNORMAL LOW (ref >60)
Glucose, Bld: 137 mg/dL — ABNORMAL HIGH (ref 70–99)
Potassium: 3.3 mmol/L — ABNORMAL LOW (ref 3.5–5.1)
Sodium: 140 mmol/L (ref 135–145)

## 2020-11-21 LAB — CBC
HCT: 28.2 % — ABNORMAL LOW (ref 39.0–52.0)
Hemoglobin: 9.6 g/dL — ABNORMAL LOW (ref 13.0–17.0)
MCH: 32.9 pg (ref 26.0–34.0)
MCHC: 34 g/dL (ref 30.0–36.0)
MCV: 96.6 fL (ref 80.0–100.0)
Platelets: DECREASED 10*3/uL (ref 150–400)
RBC: 2.92 MIL/uL — ABNORMAL LOW (ref 4.22–5.81)
RDW: 14.9 % (ref 11.5–15.5)
WBC: 19.2 10*3/uL — ABNORMAL HIGH (ref 4.0–10.5)
nRBC: 0.4 % — ABNORMAL HIGH (ref 0.0–0.2)

## 2020-11-21 LAB — GLUCOSE, CAPILLARY
Glucose-Capillary: 126 mg/dL — ABNORMAL HIGH (ref 70–99)
Glucose-Capillary: 127 mg/dL — ABNORMAL HIGH (ref 70–99)
Glucose-Capillary: 129 mg/dL — ABNORMAL HIGH (ref 70–99)
Glucose-Capillary: 156 mg/dL — ABNORMAL HIGH (ref 70–99)

## 2020-11-21 LAB — MAGNESIUM: Magnesium: 2.5 mg/dL — ABNORMAL HIGH (ref 1.7–2.4)

## 2020-11-21 LAB — PHOSPHORUS: Phosphorus: 2.5 mg/dL (ref 2.5–4.6)

## 2020-11-21 MED ORDER — POTASSIUM CHLORIDE 10 MEQ/50ML IV SOLN
10.0000 meq | INTRAVENOUS | Status: AC
  Administered 2020-11-21 (×4): 10 meq via INTRAVENOUS
  Filled 2020-11-21 (×4): qty 50

## 2020-11-21 MED ORDER — METOPROLOL TARTRATE 5 MG/5ML IV SOLN
5.0000 mg | Freq: Once | INTRAVENOUS | Status: AC
  Administered 2020-11-21: 5 mg via INTRAVENOUS

## 2020-11-21 MED ORDER — POTASSIUM PHOSPHATES 15 MMOLE/5ML IV SOLN
20.0000 mmol | Freq: Once | INTRAVENOUS | Status: AC
  Administered 2020-11-21: 20 mmol via INTRAVENOUS
  Filled 2020-11-21: qty 6.67

## 2020-11-21 MED ORDER — TRAVASOL 10 % IV SOLN
INTRAVENOUS | Status: AC
  Filled 2020-11-21: qty 1209.6

## 2020-11-21 MED ORDER — FUROSEMIDE 10 MG/ML IJ SOLN
40.0000 mg | Freq: Once | INTRAMUSCULAR | Status: AC
  Administered 2020-11-21: 40 mg via INTRAVENOUS
  Filled 2020-11-21: qty 4

## 2020-11-21 MED ORDER — METOPROLOL TARTRATE 5 MG/5ML IV SOLN
2.5000 mg | Freq: Two times a day (BID) | INTRAVENOUS | Status: DC
Start: 2020-11-21 — End: 2020-11-21

## 2020-11-21 MED ORDER — METOPROLOL TARTRATE 5 MG/5ML IV SOLN
5.0000 mg | INTRAVENOUS | Status: AC | PRN
  Administered 2020-11-21: 5 mg via INTRAVENOUS
  Administered 2020-11-22: 14:00:00 2.5 mg via INTRAVENOUS
  Filled 2020-11-21 (×2): qty 5

## 2020-11-21 MED ORDER — METOPROLOL TARTRATE 5 MG/5ML IV SOLN
INTRAVENOUS | Status: AC
  Administered 2020-11-21: 5 mg via INTRAVENOUS
  Filled 2020-11-21: qty 5

## 2020-11-21 MED ORDER — METOPROLOL TARTRATE 5 MG/5ML IV SOLN
2.5000 mg | Freq: Two times a day (BID) | INTRAVENOUS | Status: DC
  Administered 2020-11-22 – 2020-11-25 (×7): 2.5 mg via INTRAVENOUS
  Filled 2020-11-21 (×7): qty 5

## 2020-11-21 NOTE — Progress Notes (Signed)
3 Days Post-Op  Subjective: Patient trying to be weaned but then went into vtach/SVT and hypertension.  Had a bloody BM last night but otherwise stable, hgb stable.  No further since that time.    ROS: unable on vent  Objective: Vital signs in last 24 hours: Temp:  [98.4 F (36.9 C)-99.3 F (37.4 C)] 98.4 F (36.9 C) (12/10 0400) Pulse Rate:  [79-140] 102 (12/10 0700) Resp:  [17-35] 21 (12/10 0730) BP: (72-134)/(54-94) 93/61 (12/10 0730) SpO2:  [90 %-100 %] 90 % (12/10 0827) FiO2 (%):  [30 %-50 %] 50 % (12/10 0827) Weight:  [62.7 kg] 62.7 kg (12/10 0400) Last BM Date: 11/22/2020  Intake/Output from previous day: 12/09 0701 - 12/10 0700 In: 3462.9 [I.V.:2594.5; IV Piggyback:868.4] Out: 950 [Urine:850; Emesis/NG output:100] Intake/Output this shift: Total I/O In: 50.5 [I.V.:50.5] Out: -   PE: Gen: agitated on vent Heart: tachy Lungs: CTAB Abd: distended still, g-tube to gravity with no output, NGT in place with some dark bilious feculent output still.  Hasn't been working overnight due to connection issue.  Got this adjusted.  Midline wound is clean, muscle at base is dark, but otherwise ok.  Lab Results:  Recent Labs    11/20/20 0732 11/21/20 0636  WBC 27.7* 19.2*  HGB 10.0* 9.6*  HCT 28.8* 28.2*  PLT 130* PLATELET CLUMPS NOTED ON SMEAR, COUNT APPEARS DECREASED   BMET Recent Labs    11/20/20 0458 11/21/20 0500  NA 140 140  K 3.3* 3.3*  CL 108 108  CO2 21* 22  GLUCOSE 176* 137*  BUN 34* 43*  CREATININE 1.86* 1.70*  CALCIUM 7.0* 7.0*   PT/INR No results for input(s): LABPROT, INR in the last 72 hours. CMP     Component Value Date/Time   NA 140 11/21/2020 0500   NA 145 03/08/2016 1356   K 3.3 (L) 11/21/2020 0500   K 4.5 03/08/2016 1356   CL 108 11/21/2020 0500   CO2 22 11/21/2020 0500   CO2 28 03/08/2016 1356   GLUCOSE 137 (H) 11/21/2020 0500   GLUCOSE 98 03/08/2016 1356   BUN 43 (H) 11/21/2020 0500   BUN 14.2 03/08/2016 1356   CREATININE  1.70 (H) 11/21/2020 0500   CREATININE 0.9 03/08/2016 1356   CALCIUM 7.0 (L) 11/21/2020 0500   CALCIUM 9.8 03/08/2016 1356   PROT 3.6 (L) 11/20/2020 0458   PROT 8.2 03/08/2016 1356   ALBUMIN 1.7 (L) 11/20/2020 0458   ALBUMIN 3.6 03/08/2016 1356   AST 26 11/20/2020 0458   AST 42 (H) 03/08/2016 1356   ALT 12 11/20/2020 0458   ALT 43 03/08/2016 1356   ALKPHOS 30 (L) 11/20/2020 0458   ALKPHOS 79 03/08/2016 1356   BILITOT 2.2 (H) 11/20/2020 0458   BILITOT 0.74 03/08/2016 1356   GFRNONAA 42 (L) 11/21/2020 0500   GFRAA >60 12/31/2019 0221   Lipase     Component Value Date/Time   LIPASE 57 (H) 11/28/2020 1830       Studies/Results: No results found.  Anti-infectives: Anti-infectives (From admission, onward)   Start     Dose/Rate Route Frequency Ordered Stop   11/19/20 1130  piperacillin-tazobactam (ZOSYN) IVPB 3.375 g        3.375 g 12.5 mL/hr over 240 Minutes Intravenous Every 8 hours 11/19/20 1017     11/19/20 1115  fluconazole (DIFLUCAN) IVPB 200 mg        200 mg 100 mL/hr over 60 Minutes Intravenous Every 24 hours 11/19/20 1017 11/22/20 1114  11/19/20 0915  Ampicillin-Sulbactam (UNASYN) 3 g in sodium chloride 0.9 % 100 mL IVPB  Status:  Discontinued        3 g 200 mL/hr over 30 Minutes Intravenous Every 12 hours 11/19/20 0826 11/19/20 0949       Assessment/Plan PMH bladder cancer s/p cystectomy and ileal conduit AKI on CKD - Creatinine1.70 PMH CVA - hold plavix HTN Tobacco abuse Hypotension intra-op - pressors off today SPCM - prealbumin is 6.7. TNA written for yesterday, should start today VDRF - per CCM.  Vtach/SVT - per CCM  POD 3, s/p ex lap with SBR, placement of a gastrostomy tube for SBO, Dr. Georgette Dover 12/7 -cont NGT and await bowel function. Anticipate prolonged post op ileus given nature of surgery and feculent drainage from NGT -BID dressing changes to midline wound -g-tube put to gravity to see if this will help with some decompression as he is  still very distended.   -WBC down to 19K  FEN -NPO/NGT/TNA VTE -heparin ID -zosyn   LOS: 9 days    Henreitta Cea , [facility] 11/21/2020, 8:30 AM Please see Amion for pager number during day hours 7:00am-4:30pm or 7:00am -11:30am on weekends

## 2020-11-21 NOTE — Progress Notes (Addendum)
74 year old admitted to widened 11/30 with SBO, left AMA, admitted 12/1 found to have ischemic bowel required small bowel resection, placement of G-tube , course complicated by postop respiratory failure, prolonged ileus  PMH of CVA, Seizures, Dementia, Bladder ca s/p urostomy, tobacco use, right glottic carcinoma s/p excision  Developed SVT on weaning this morning  On exam -appears weak and deconditioned, elderly man, mild distress, tachycardic, on spontaneous breathing trial pressure support 5/5 breathing 35 times a minute, mild accessory muscle use, abdominal distention with G-tube, minimal output, urostomy, follows commands intermittently, bilateral ventilated breath sounds no rhonchi.  No edema.  Labs show mild hypokalemia, decrease in creatinine, mild hypophosphatemia, normal magnesium, decreasing leukocytosis, stable anemia.  Chest x-ray independently reviewed which shows bilateral effusions.  Impression/plan  Acute respiratory failure -appears marginal on spontaneous breathing trial with high work of breathing/R SBI, will attempt to diurese with 40 of Lasix prior to extubating  AKI on CKD-improving, hypokalemia will be repleted.  Prolonged ileus -TNA per surgery , empiric Zosyn can probably stop after 5 days , leukocytosis decreasing  Prior CVA -Plavix on hold, resume when able to take oral.  SVT-resume potassium aggressively, normal LV function on echo 12/2019, home Coreg will use low-dose Lopressor here  The patient is critically ill with multiple organ systems failure and requires high complexity decision making for assessment and support, frequent evaluation and titration of therapies, application of advanced monitoring technologies and extensive interpretation of multiple databases. Critical Care Time devoted to patient care services described in this note independent of APP/resident  time is 35 minutes.   Leanna Sato Elsworth Soho MD

## 2020-11-21 NOTE — Progress Notes (Signed)
PICC tip verified with ECG technology to be in the distal SVC near the CAJ.

## 2020-11-21 NOTE — Progress Notes (Signed)
[place] Progress Note Patient Name: Ronald Wolf DOB: 20-Feb-1946 MRN: [id number]   Date of Service  11/21/2020  HPI/Events of Note  RN requests bilateral soft wrist restraints to help ensure patient safety.   eICU Interventions  Order entered.     Intervention Category Minor Interventions: Agitation / anxiety - evaluation and management  Charlott Rakes 11/21/2020, 9:53 PM

## 2020-11-21 NOTE — Progress Notes (Signed)
Pt had moderate size BM with bright red blood; notified Dr Grandville Silos, v/o to draw CBC

## 2020-11-21 NOTE — Progress Notes (Addendum)
NAME:  Ronald Wolf, MRN:  [id number], DOB:  1946/05/24, LOS: 9 ADMISSION DATE:  11/15/2020, CONSULTATION DATE:  11/16/2020 REFERRING MD: Cyndia Skeeters CHIEF COMPLAINT:  hypotension   Brief History   Ronald Wolf is a 74 yo M w/ PMH of CVA, Seizures, Dementia, Bladder ca s/p urostomy, tobacco use, right glottic carcinoma s/p excision presenting with sbo. Found to have ischemic bowel requiring ex-lap. PCCM consulted due to post-op hypotension / respiratory distress  History of present illness   Ronald Wolf is a 74 yo M w/ above PMH who initially presented to [facility] hospital on 11/11/20 with abdominal pain and nausea of 4-5 day duration. He was found to have SBO but left against medical advice as his family did not feel comfortable with him receiving his medical care in [place]. He came to [facility] ED the next day with similar complaint and was found to have SBO. He was treated with supportive care with bowel rest and ng suction. However his symptoms did not improve. Surgery team was consulted and he was brough to OR for exploratory laparoscopy. He was found to have multiple sections of ischemic bowel which were resected. He was unable to be extubated post-operatively and PCCM was consulted.  Past Medical History  CVA, Dementia, Bladder CA s/p nephrostomy, tobacco use, right glottic carcinoma s/p excision, seizures.  Significant Hospital Events   11/19/2020 Admission 12/05/2020 OR 11/12/2020 Admit to ICU  Consults:  General Surgery PCCM Procedures:  11/22/2020 Ex-lap, G-tube placement  Significant Diagnostic Tests:   CT abd/pelvis 12/2 > High-grade distal small bowel obstruction, possibly due to adhesion. Mild ascites and diffuse mesenteric edema. No evidence of focal inflammatory process or abscess. Small bilateral pleural effusions and mild right lower lobe Atelectasis.  Chest X-Ray 12/2 > Right hemithorax   Micro Data:  11/30/2020 COVID/Flu negative 12/7 BA: Few GNR rare GPC -- predominantly PMN   12/7 BCx> NGTD   Antimicrobials:  Fluc 12/8>> Zosyn 12/8>>   Interim history/subjective:  Off sedation at 0600 PSV CPAP at 0720 Significantly improved mentation  Brief SVT with associated hypertension. Self limited Given fentanyl, metop and hydral for HTN  Is now back on precedex, continues on vent wean  K is being replaced    UOP continues to improve -- 0.19ml/kg/hr overnight. Report of bloody BM overnight. Hgb stable and no further reports since.   Objective   Blood pressure 93/61, pulse (!) 102, temperature 98.2 F (36.8 C), temperature source Oral, resp. rate (!) 21, height 5\' 11"  (1.803 m), weight 62.7 kg, SpO2 90 %.    Vent Mode: PSV;CPAP FiO2 (%):  [30 %-50 %] 50 % Set Rate:  [15 bmp-16 bmp] 15 bmp Vt Set:  [450 mL-600 mL] 450 mL PEEP:  [5 cmH20] 5 cmH20 Pressure Support:  [5 cmH20] 5 cmH20 Plateau Pressure:  [24 cmH20-26 cmH20] 25 cmH20   Intake/Output Summary (Last 24 hours) at 11/21/2020 0948 Last data filed at 11/21/2020 0900 Gross per 24 hour  Intake 3554.37 ml  Output 950 ml  Net 2604.37 ml   Filed Weights   11/19/20 0500 11/20/20 0500 11/21/20 0400  Weight: 60 kg 66.7 kg 62.7 kg    Examination: General: Critically ill and chronically ill, frail appearing older adult M, intubated and lightly sedated NAD, but pulling at ETT  HEENT: Temporal muscle wasting. ETT secure. OGT secure with brown output. Anicteric sclera  Neuro: Awake, following commands. PERRLA.  PULM:  Bilateral scattered rhonchi. Symmetrical chest expansion, even unlabored resp on PSV/CPAP with interemitently  elevated RR   GI: Distended, tender. Midline abd incision examined, no purulence.  GU: RUQ urostomy, amber urine  Extremities: No obvious joint deformity. No cyanosis or clubbing. BUE mittens  Skin:c/d/w without rash   Resolved Hospital Problem list   N/A  Assessment & Plan:   Acute encephalopathy, improving  -multifactorial, possible early chronic component though would not  explain acute AMS.  -certainly at risk for ICU delirium, encephalopathy due to sepsis, CNS depressing meds/anesthesia, AKI  P -minimize sedation as able -delirium precautions -correct metabolic abnormalities as able   Acute hypoxic respiratory failure requiring intubation  -Patient continued to require intubation post operatively, unable to be weaned off of the vent. Chest X-ray performed, revealing right sided hemithorax. -Bronch done 12/7 with mucus plugging seen P: -WUA/SBT -12/10 CXR pending  -Wean vent as able for SpO2 > 90 -VAP, PAD  -12/10 pt following commands, SBT with intermittent tachypnea. Will cont wean this morning and possibly extubate -- will depend largely on RR as SBT going on -did discuss w sister 12/10 that if extubation occurs, there is possibility that may need to be re-intubated.   SBO complicated by ischemic bowel with perforation s/p ex-lap with anastomosis and G tube placement. -SBO failed conservative measures. Ex-lap 12/7 with findings of ischemic bowel, s/p resection and G-tube placement.  Post op ileus  P: -post op per CCS -TPN via PICC  -Continue NGT to LIWS, G tube to gravity. No G tube output yet.  Septic shock, shock improved -off pressors 12/10 Severe sepsis  with likely IAI in setting of bowel perf  P -cont Fluc and Zosyn -trend CBC  -MAP goal > 65   Acute Kidney Injury, improving  -Cr continuing to downtrend, UOP continuing to improve  P: -trend renal indices and UOP  -mIVF 53ml/hr -appreciate pharmacist assistance with renal dosing of meds as indicated   SVT, self limited, with associated hypertension   Hypertension -12/10 in setting of sedation holiday + abd pain + SBT P -cont PRN metop, prn hydral. Seems to be much improved, will monitor need for possible scheduled antihypertensive -analgesia as above -replacing electrolytes as below  -will also check iCal   Hypokalemia -replace, continue to trend  -Mag 2.5, no indication to  replace   Seizure Disorder -Has hx of epilepsy confirmed via EEG 12/24/19. On levetiracetam at home. P: -Cont Keppra  Hx of Bladder Cancer -Bladder cancer s/p urostomy placement P: -urostomy  -trend UOP   Anemia -component of Fe def P -defer IV iron-- will plan to supplement PO iron when about to take enteral  Best practice (evaluated daily)  Diet: TPN  Pain/Anxiety/Delirium protocol (if indicated):  -Precedex PRN fent  VAP protocol (if indicated): Yes  DVT prophylaxis: SQH  GI prophylaxis: PPI Glucose control: SSI  Mobility:BR  last date of multidisciplinary goals of care discussion N/A Family and staff present N/A Summary of discussion N/A Follow up goals of care discussion due - Family updated -- I spoke with sister, Clarene Critchley over the phone 12/10. Updates provided Code Status: Full Disposition: ICU  Labs   CBC: Recent Labs  Lab 11/17/20 0431 12/05/2020 0340 11/27/2020 1658 12/02/2020 1748 11/19/20 0526 11/20/20 0732 11/21/20 0636  WBC 7.9  --   --  9.3 31.4* 27.7* 19.2*  NEUTROABS  --   --   --   --  26.4*  --   --   HGB 11.5*   < > 11.9* 14.1 11.7* 10.0* 9.6*  HCT 35.3*   < >  35.0* 42.6 34.8* 28.8* 28.2*  MCV 101.7*  --   --  94.7 96.7 96.0 96.6  PLT 302  --   --  204 159 130* PLATELET CLUMPS NOTED ON SMEAR, COUNT APPEARS DECREASED   < > = values in this interval not displayed.    Basic Metabolic Panel: Recent Labs  Lab 11/17/20 0431 11/12/2020 0340 11/17/2020 1658 12/07/2020 1748 11/19/20 0526 11/19/20 0732 11/20/20 0458 11/21/20 0500  NA 134* 141 142 139 139  --  140 140  K 4.1 3.9 3.3* 3.4* 4.3  --  3.3* 3.3*  CL 100 106  --  110 109  --  108 108  CO2 24 25  --  18* 15*  --  21* 22  GLUCOSE 133* 131*  --  128* 117*  --  176* 137*  BUN 20 14  --  17 21  --  34* 43*  CREATININE 1.33* 1.24  --  1.31* 2.08*  --  1.86* 1.70*  CALCIUM 8.4* 8.1*  --  7.5* 7.3*  --  7.0* 7.0*  MG 1.8 1.8  --   --  1.7  --  2.1 2.5*  PHOS 1.8* 2.8  --   --  6.3* 5.6* 2.8 2.5    GFR: Estimated Creatinine Clearance: 33.8 mL/min (A) (by C-G formula based on SCr of 1.7 mg/dL (H)). Recent Labs  Lab 11/25/2020 1748 12/05/2020 2109 11/19/20 0526 11/19/20 1037 11/19/20 1450 11/20/20 0732 11/21/20 0636  WBC 9.3  --  31.4*  --   --  27.7* 19.2*  LATICACIDVEN 2.6* 3.1*  --  3.5* 2.9*  --   --     Liver Function Tests: Recent Labs  Lab 11/16/20 0309 11/17/20 0431 12/08/2020 0340 11/19/20 0526 11/20/20 0458  AST  --  17  --  24 26  ALT  --  15  --  11 12  ALKPHOS  --  46  --  27* 30*  BILITOT  --  1.2  --  2.7* 2.2*  PROT  --  5.6*  --  4.4* 3.6*  ALBUMIN 2.6* 2.7* 2.5* 2.7* 1.7*   No results for input(s): LIPASE, AMYLASE in the last 168 hours. No results for input(s): AMMONIA in the last 168 hours.  ABG    Component Value Date/Time   PHART 7.428 12/02/2020 1658   PCO2ART 32.9 11/12/2020 1658   PO2ART 408 (H) 11/28/2020 1658   HCO3 21.7 12/05/2020 1658   TCO2 23 11/12/2020 1658   ACIDBASEDEF 2.0 11/19/2020 1658   O2SAT 100.0 12/01/2020 1658     Coagulation Profile: No results for input(s): INR, PROTIME in the last 168 hours.  Cardiac Enzymes: No results for input(s): CKTOTAL, CKMB, CKMBINDEX, TROPONINI in the last 168 hours.  HbA1C: Hgb A1c MFr Bld  Date/Time Value Ref Range Status  11/19/2020 10:37 AM 5.0 4.8 - 5.6 % Final    Comment:    (NOTE) Pre diabetes:          5.7%-6.4%  Diabetes:              >6.4%  Glycemic control for   <7.0% adults with diabetes   12/17/2019 08:19 AM 4.5 (L) 4.8 - 5.6 % Final    Comment:    (NOTE) Pre diabetes:          5.7%-6.4% Diabetes:              >6.4% Glycemic control for   <7.0% adults with diabetes     CBG: Recent  Labs  Lab 11/20/20 0608 11/20/20 1223 11/20/20 1836 11/20/20 2321 11/21/20 0530  GLUCAP 148* 133* 95 107* 126*   CRITICAL CARE Performed by: Cristal Generous   Total critical care time: 62 minutes   Critical care time was exclusive of separately billable procedures and  treating other patients. Critical care was necessary to treat or prevent imminent or life-threatening deterioration.  Critical care was time spent personally by me on the following activities: development of treatment plan with patient and/or surrogate as well as nursing, discussions with consultants, evaluation of patient's response to treatment, examination of patient, obtaining history from patient or surrogate, ordering and performing treatments and interventions, ordering and review of laboratory studies, ordering and review of radiographic studies, pulse oximetry and re-evaluation of patient's condition.  Eliseo Gum MSN, AGACNP-BC [place] [phone] If no answer, [phone] 11/21/2020, 9:49 AM

## 2020-11-21 NOTE — Progress Notes (Signed)
PHARMACY - TOTAL PARENTERAL NUTRITION CONSULT NOTE  Indication: Small bowel obstruction  Patient Measurements: Height: 5\' 11"  (180.3 cm) Weight: 61.2 kg (134 lb 14.7 oz) IBW/kg (Calculated) : 75.3 TPN AdjBW (KG): 60.8 Body mass index is 18.82 kg/m. Usual Weight: 137 lbs  Assessment:  74 YOM presented on 12/12/2020 with nausea, vomiting, abdominal pain after leaving AMA at [facility] with known SBO.  Patient has been on bowel rest with NG tube decompression.  Patient continued to have persistent SBO on imaging with no evidence of improvement, now s/p ex-lap with LoA, SBR and placement of Gtube on 11/17/2020.  Pharmacy consulted for TPN management.  Expect prolonged ileus.  Glucose / Insulin: no hx DM - CBGs < 180.  Used 3 units SSI yesterday. Electrolytes: K remains at 3.3 post ~4 runs (goal >/= 4 for ileus), CO2 normalized with max acetate, Phos down to 2.5 post 10 mmol (question accuracy of 11/8 labs), CoCa up to 8.84 post 1gm on 12/8, Mag slightly elevated at 2.5 Renal: AKI - SCr down 1.7 (BL 0.9-1.1), BUN up to 43 LFTs / TGs: LFTs WNL, tbili down to 2.2, TG WNL (ILE 15% of kCal d/t shortage) Prealbumin / albumin: BL prealbumin low at 6.7, albumin down to 1.7 Intake / Output; MIVF: UOP 0.6 ml/kg/hr (up), NG 142mL, NS at 23ml/hr, net +10.5L GI Imaging: none since TPN start Surgeries / Procedures: none since TPN start  Central access: PICC placed 11/19/2020 TPN start date: 11/19/20   Nutritional Goals (pre RD rec 12/7): 2100-2300 kCal and 120-135gm protein per day  Current Nutrition:  TPN (Goal Pivot 1.5 = 60 ml/hr)  Plan:  Increase TPN to goal rate 90 ml/hr at 1800 TPN will provide 121g AA, 389g CHO and 31g ILE for a total of 2120 kCal, meeting 100% of needs Electrolytes in TPN: Na 62mEq/L, incr K 70mEq/L (= 108 mEq/d), Ca 63mEq/L, decr Mag 43mEq/L, Phos 15 mmol/L, max acetate - all lytes increase with increased TPN rate except Mag Daily multivitamin and trace elements in TPN Add folate  1mg  to daily TPN since on folate supplementation PTA Continue sensitive SSI Q6H - D/C if CBGs remain controlled at goal TPN rate Reduce NS to [facility] when new TPN bag starts KCL x 4 runs KPhos 20 mmol IV x 1 F/U AM labs, watch volume status  Ronald Wolf D. Mina Marble, PharmD, BCPS, [place] 11/21/2020, 8:31 AM

## 2020-11-22 ENCOUNTER — Inpatient Hospital Stay (HOSPITAL_COMMUNITY): Payer: Medicare PPO

## 2020-11-22 DIAGNOSIS — E43 Unspecified severe protein-calorie malnutrition: Secondary | ICD-10-CM

## 2020-11-22 LAB — POCT I-STAT 7, (LYTES, BLD GAS, ICA,H+H)
Acid-Base Excess: 0 mmol/L (ref 0.0–2.0)
Bicarbonate: 24.4 mmol/L (ref 20.0–28.0)
Calcium, Ion: 1.05 mmol/L — ABNORMAL LOW (ref 1.15–1.40)
HCT: 21 % — ABNORMAL LOW (ref 39.0–52.0)
Hemoglobin: 7.1 g/dL — ABNORMAL LOW (ref 13.0–17.0)
O2 Saturation: 100 %
Patient temperature: 99.4
Potassium: 3.7 mmol/L (ref 3.5–5.1)
Sodium: 142 mmol/L (ref 135–145)
TCO2: 26 mmol/L (ref 22–32)
pCO2 arterial: 39 mmHg (ref 32.0–48.0)
pH, Arterial: 7.407 (ref 7.350–7.450)
pO2, Arterial: 233 mmHg — ABNORMAL HIGH (ref 83.0–108.0)

## 2020-11-22 LAB — BASIC METABOLIC PANEL
Anion gap: 13 (ref 5–15)
BUN: 54 mg/dL — ABNORMAL HIGH (ref 8–23)
CO2: 20 mmol/L — ABNORMAL LOW (ref 22–32)
Calcium: 7.1 mg/dL — ABNORMAL LOW (ref 8.9–10.3)
Chloride: 107 mmol/L (ref 98–111)
Creatinine, Ser: 1.47 mg/dL — ABNORMAL HIGH (ref 0.61–1.24)
GFR, Estimated: 50 mL/min — ABNORMAL LOW (ref >60)
Glucose, Bld: 155 mg/dL — ABNORMAL HIGH (ref 70–99)
Potassium: 3.6 mmol/L (ref 3.5–5.1)
Sodium: 140 mmol/L (ref 135–145)

## 2020-11-22 LAB — GLUCOSE, CAPILLARY
Glucose-Capillary: 147 mg/dL — ABNORMAL HIGH (ref 70–99)
Glucose-Capillary: 154 mg/dL — ABNORMAL HIGH (ref 70–99)
Glucose-Capillary: 112 mg/dL — ABNORMAL HIGH (ref 70–99)

## 2020-11-22 LAB — PHOSPHORUS: Phosphorus: 3.1 mg/dL (ref 2.5–4.6)

## 2020-11-22 LAB — MAGNESIUM: Magnesium: 1.8 mg/dL (ref 1.7–2.4)

## 2020-11-22 MED ORDER — FUROSEMIDE 10 MG/ML IJ SOLN
40.0000 mg | Freq: Once | INTRAMUSCULAR | Status: AC
  Administered 2020-11-22: 11:00:00 40 mg via INTRAVENOUS
  Filled 2020-11-22: qty 4

## 2020-11-22 MED ORDER — MAGNESIUM SULFATE IN D5W 1-5 GM/100ML-% IV SOLN
1.0000 g | Freq: Once | INTRAVENOUS | Status: AC
  Administered 2020-11-22: 11:00:00 1 g via INTRAVENOUS
  Filled 2020-11-22: qty 100

## 2020-11-22 MED ORDER — ETOMIDATE 2 MG/ML IV SOLN
20.0000 mg | Freq: Once | INTRAVENOUS | Status: AC
  Administered 2020-11-22: 14:00:00 20 mg via INTRAVENOUS

## 2020-11-22 MED ORDER — POTASSIUM CHLORIDE 10 MEQ/50ML IV SOLN: 10.0000 meq | INTRAVENOUS | Status: DC

## 2020-11-22 MED ORDER — POTASSIUM CHLORIDE 10 MEQ/50ML IV SOLN
10.0000 meq | INTRAVENOUS | Status: AC
  Administered 2020-11-22 (×4): 10 meq via INTRAVENOUS
  Filled 2020-11-22 (×4): qty 50

## 2020-11-22 MED ORDER — ROCURONIUM BROMIDE 10 MG/ML (PF) SYRINGE
PREFILLED_SYRINGE | INTRAVENOUS | Status: AC
  Filled 2020-11-22: qty 10

## 2020-11-22 MED ORDER — TRAVASOL 10 % IV SOLN
INTRAVENOUS | Status: AC
  Filled 2020-11-22: qty 1209.6

## 2020-11-22 MED ORDER — ETOMIDATE 2 MG/ML IV SOLN
INTRAVENOUS | Status: AC
  Filled 2020-11-22: qty 10

## 2020-11-22 MED ORDER — MIDAZOLAM HCL 2 MG/2ML IJ SOLN: 2.0000 mg | Freq: Once | INTRAMUSCULAR | Status: AC

## 2020-11-22 MED ORDER — MIDAZOLAM HCL 2 MG/2ML IJ SOLN
INTRAMUSCULAR | Status: AC
  Administered 2020-11-22: 14:00:00 2 mg
  Filled 2020-11-22: qty 2

## 2020-11-22 MED ORDER — SODIUM CHLORIDE 0.9 % IV BOLUS
1000.0000 mL | Freq: Once | INTRAVENOUS | Status: AC
  Administered 2020-11-22: 15:00:00 1000 mL via INTRAVENOUS

## 2020-11-22 NOTE — Progress Notes (Signed)
PHARMACY - TOTAL PARENTERAL NUTRITION CONSULT NOTE  Indication: Small bowel obstruction, prolonged ileus  Patient Measurements: Height: 5\' 11"  (180.3 cm) Weight: 61.2 kg (134 lb 14.7 oz) IBW/kg (Calculated) : 75.3 TPN AdjBW (KG): 60.8 Body mass index is 18.82 kg/m. Usual Weight: 137 lbs  Assessment:  74 YOM presented 12/01/2020 with nausea, vomiting, abdominal pain after leaving AMA at [facility] with known SBO. Patient has been on bowel rest with NG tube decompression. Patient continued to have persistent SBO on imaging with no evidence of improvement, now s/p ex-lap with LoA, SBR and placement of Gtube on 11/27/2020. Pharmacy consulted for TPN management.  Expected prolonged ileus.  Patient currently intubated in ICU, sedated on fentanyl PRN + Precedex. CCM hopeful to extubate soon  Glucose / Insulin: no hx DM - CBGs < 180. Utilized 4 units SSI in last 24hrs Electrolytes: K up to 3.6 (s/p 4 K runs + KPhos yesterday; goal >/= 4 for ileus), CO2 low 20 (max acetate in TPN), Phos up to 3.1 (s/p KPhos 25mmol x 1 yesterday), Mag down to 1.8 (decreased in TPN 12/10; goal >/=2 with ileus); others WNL Renal: AKI improving - SCr down to 1.47 (BL 0.9-1.1), BUN up to 54. S/p Lasix 40 IV x 1 yesterday LFTs / TGs: LFTs / TG WNL, Tbili down to 2.2 Prealbumin / albumin: BL prealbumin low 6.7, albumin 1.7 Intake / Output; MIVF: UOP 1.6 ml/kg/hr, NGT output 159mL/24hrs, net +11.2L since admit; LBM 12/11 GI Imaging: none since TPN start Surgeries / Procedures: none since TPN start  Central access: PICC placed 12/06/2020 TPN start date: 11/19/20   Nutritional Goals (per RD recommendations 12/8): 2100-2300 kCal, 120-135g protein, >2L fluid per day  Current Nutrition:  TPN; NPO (Goal Pivot 1.5 = 60 ml/hr when able to start)  Plan:  Continue TPN at goal rate 90 ml/hr at 1800 TPN will provide 121g AA, 389g CHO, and 31g SMOF lipids (15% of total kCal due to [employer]), for a total of 2120 kCal, meeting  100% of needs. Electrolytes in TPN: Na 40mEq/L, K increase to 18mEq/L, Ca 45mEq/L, Mag increase to 29mEq/L, Phos 15 mmol/L, max acetate Give K runs x 4 + Mag sulfate 1g IV x 1 Add standard daily multivitamin and trace elements + folic acid 1mg  (on supplementation PTA) in TPN Continue sensitive SSI Q6H - consider d/c 12/12 if CBGs remain controlled at goal TPN rate F/U AM labs, Surgery plans, watch volume status   [name], PharmD, BCPS Please check AMION for all [place] contact numbers Clinical Pharmacist 11/22/2020 8:01 AM

## 2020-11-22 NOTE — Procedures (Signed)
Intubation Procedure Note  Ronald Wolf  [id number]  1946/02/27  Date:11/22/20  Time:2:07 PM   Provider Performing:Latronda Spink V. Jarman Litton    Procedure: Intubation (90211)  Indication(s) Respiratory Failure  Consent Unable to obtain consent due to emergent nature of procedure.   Anesthesia Etomidate, Versed and Fentanyl   Time Out Verified patient identification, verified procedure, site/side was marked, verified correct patient position, special equipment/implants available, medications/allergies/relevant history reviewed, required imaging and test results available.   Sterile Technique Usual hand hygeine, masks, and gloves were used   Procedure Description Patient positioned in bed supine.  Sedation given as noted above.  Patient was intubated with endotracheal tube using Glidescope.  View was Grade 1 full glottis .  Number of attempts was 1.  Colorimetric CO2 detector was consistent with tracheal placement.   Complications/Tolerance None; patient tolerated the procedure well. Chest X-ray is ordered to verify placement.   EBL Minimal   Specimen(s) None  Ronald Wolf V. Ronald Soho MD

## 2020-11-22 NOTE — Progress Notes (Signed)
NAME:  Ronald Wolf, MRN:  [id number], DOB:  January 10, 1946, LOS: 1 ADMISSION DATE:  12/12/2020, CONSULTATION DATE:  12/10/2020 REFERRING MD: Cyndia Skeeters CHIEF COMPLAINT:  hypotension   Brief History   Ronald Wolf is a 74 yo M w/ PMH of CVA, Seizures, Dementia, Bladder ca s/p urostomy, tobacco use, right glottic carcinoma s/p excision presenting with sbo. Found to have ischemic bowel requiring ex-lap. PCCM consulted due to post-op hypotension / respiratory distress  History of present illness   Ronald Wolf is a 74 yo M w/ above PMH who initially presented to [facility] hospital on 11/11/20 with abdominal pain and nausea of 4-5 day duration. He was found to have SBO but left against medical advice as his family did not feel comfortable with him receiving his medical care in [place]. He came to [facility] ED the next day with similar complaint and was found to have SBO. He was treated with supportive care with bowel rest and ng suction. However his symptoms did not improve. Surgery team was consulted and he was brough to OR for exploratory laparoscopy. He was found to have multiple sections of ischemic bowel which were resected. He was unable to be extubated post-operatively and PCCM was consulted.  Past Medical History  CVA, Dementia, Bladder CA s/p nephrostomy, tobacco use, right glottic carcinoma s/p excision, seizures.  Significant Hospital Events   11/19/2020 Admission 11/29/2020 OR 12/12/2020 Admit to ICU 12/10 Brief SVT with associated hypertension. Self limited Given fentanyl, metop and hydral for HTN  Consults:  General Surgery PCCM Procedures:  12/04/2020 Ex-lap, G-tube placement  Significant Diagnostic Tests:   CT abd/pelvis 12/2 > High-grade distal small bowel obstruction, possibly due to adhesion. Mild ascites and diffuse mesenteric edema. No evidence of focal inflammatory process or abscess. Small bilateral pleural effusions and mild right lower lobe Atelectasis.    Micro Data:  11/15/2020  COVID/Flu negative 12/7 BA: Few GNR rare GPC -- predominantly PMN  12/7 BCx> NGTD   Antimicrobials:  Fluc 12/8>> Zosyn 12/8>>   Interim history/subjective:   Critically ill, intubated Hemodynamically stable Afebrile Remains on low-dose Precedex Good urine output with Lasix  Objective   Blood pressure (!) 188/80, pulse 86, temperature (!) 97.4 F (36.3 C), temperature source Axillary, resp. rate (!) 27, height 5\' 11"  (1.803 m), weight 62.7 kg, SpO2 97 %.    Vent Mode: PSV;CPAP FiO2 (%):  [30 %-40 %] 30 % Set Rate:  [15 bmp] 15 bmp Vt Set:  [450 mL] 450 mL PEEP:  [5 cmH20] 5 cmH20 Pressure Support:  [5 cmH20-10 cmH20] 10 cmH20 Plateau Pressure:  [10 cmH20-18 cmH20] 18 cmH20   Intake/Output Summary (Last 24 hours) at 11/22/2020 0905 Last data filed at 11/22/2020 0600 Gross per 24 hour  Intake 3153.5 ml  Output 2675 ml  Net 478.5 ml   Filed Weights   11/19/20 0500 11/20/20 0500 11/21/20 0400  Weight: 60 kg 66.7 kg 62.7 kg    Examination: Gen:      elderly man, no distress  HEENT:  EOMI, sclera anicteric, mild pallor Neck:     No JVD; no thyromegaly Lungs:    Bilateral ventilated breath sounds, no accessory muscle use CV:         Regular rate and rhythm; no murmurs Abd:      + bowel sounds; soft, non-tender; no palpable masses, no distension , G-tube to gravity Ext:    No edema; adequate peripheral perfusion Skin:      Warm and dry; no rash Neuro:  Appears weak and deconditioned, follows commands grossly nonfocal gu -right upper quadrant urostomy   Chest x-ray independently reviewed small bilateral effusions layering.  Labs show mild hypokalemia, improving renal function   Resolved Hospital Problem list   N/A  Assessment & Plan:   Acute encephalopathy, improving  -multifactorial,  at risk for ICU delirium, encephalopathy due to sepsis, CNS depressing meds/anesthesia, AKI  P -Using Precedex and as needed fentanyl, goal RASS 0 -delirium  precautions   Acute hypoxic respiratory failure requiring intubation  -Patient continued to require intubation post operatively, unable to be weaned off of the vent. Chest X-ray performed, revealing right sided hemithorax. -Bronch done 12/7 with mucus plugging seen P: -WUA/SBT -Continue spontaneous breathing trials, appears marginal with high work of breathing on pressure support , may need more diuresis and improvement in general condition before extubation  SBO complicated by ischemic bowel with perforation s/p ex-lap with anastomosis and G tube placement. -SBO failed conservative measures. Ex-lap 12/7 with findings of ischemic bowel, s/p resection and G-tube placement.  Post op ileus  P: -post op per CCS -TPN via PICC  -Continue NGT to LIWS, G tube to gravity. No G tube output yet.  Septic shock, resolved -off pressors 12/10 Peritonitis P -cont Fluc and Zosyn x 5-7 ds -Leukocytosis trending down  Acute Kidney Injury, improving  -Cr continuing to downtrend, UOP continuing to improve  P: -Monitor while on Lasix Hypokalemia will be repleted  SVT, self limited, with associated hypertension   Hypertension  P -Schedule metoprolol 2.5 every 12  Protein calorie malnutrition, moderate -TNA per pharmacy  Seizure Disorder -Has hx of epilepsy confirmed via EEG 12/24/19. On levetiracetam at home. P: -Cont Keppra  Hx of Bladder Cancer -Bladder cancer s/p urostomy placement P: -urostomy  -trend UOP   Anemia -component of Fe def P -defer IV iron-- will plan to supplement PO iron when about to take enteral  Best practice (evaluated daily)  Diet: TPN  Pain/Anxiety/Delirium protocol (if indicated):  -Precedex PRN fent  VAP protocol (if indicated): Yes  DVT prophylaxis: SQH  GI prophylaxis: PPI Glucose control: SSI  Mobility:BR  Family updated --  sister, Clarene Critchley  12/10.  Code Status: Full Disposition: ICU  Labs   CBC: Recent Labs  Lab 11/17/20 0431  11/23/2020 0340 12/02/2020 1658 11/22/2020 1748 11/19/20 0526 11/20/20 0732 11/21/20 0636  WBC 7.9  --   --  9.3 31.4* 27.7* 19.2*  NEUTROABS  --   --   --   --  26.4*  --   --   HGB 11.5*   < > 11.9* 14.1 11.7* 10.0* 9.6*  HCT 35.3*   < > 35.0* 42.6 34.8* 28.8* 28.2*  MCV 101.7*  --   --  94.7 96.7 96.0 96.6  PLT 302  --   --  204 159 130* PLATELET CLUMPS NOTED ON SMEAR, COUNT APPEARS DECREASED   < > = values in this interval not displayed.    Basic Metabolic Panel: Recent Labs  Lab 11/14/2020 0340 11/17/2020 1658 12/04/2020 1748 11/19/20 0526 11/19/20 0732 11/20/20 0458 11/21/20 0500 11/22/20 0647  NA 141   < > 139 139  --  140 140 140  K 3.9   < > 3.4* 4.3  --  3.3* 3.3* 3.6  CL 106  --  110 109  --  108 108 107  CO2 25  --  18* 15*  --  21* 22 20*  GLUCOSE 131*  --  128* 117*  --  176*  137* 155*  BUN 14  --  17 21  --  34* 43* 54*  CREATININE 1.24  --  1.31* 2.08*  --  1.86* 1.70* 1.47*  CALCIUM 8.1*  --  7.5* 7.3*  --  7.0* 7.0* 7.1*  MG 1.8  --   --  1.7  --  2.1 2.5* 1.8  PHOS 2.8  --   --  6.3* 5.6* 2.8 2.5 3.1   < > = values in this interval not displayed.   GFR: Estimated Creatinine Clearance: 39.1 mL/min (A) (by C-G formula based on SCr of 1.47 mg/dL (H)). Recent Labs  Lab 11/17/2020 1748 12/10/2020 2109 11/19/20 0526 11/19/20 1037 11/19/20 1450 11/20/20 0732 11/21/20 0636  WBC 9.3  --  31.4*  --   --  27.7* 19.2*  LATICACIDVEN 2.6* 3.1*  --  3.5* 2.9*  --   --     Liver Function Tests: Recent Labs  Lab 11/16/20 0309 11/17/20 0431 12/12/2020 0340 11/19/20 0526 11/20/20 0458  AST  --  17  --  24 26  ALT  --  15  --  11 12  ALKPHOS  --  46  --  27* 30*  BILITOT  --  1.2  --  2.7* 2.2*  PROT  --  5.6*  --  4.4* 3.6*  ALBUMIN 2.6* 2.7* 2.5* 2.7* 1.7*   No results for input(s): LIPASE, AMYLASE in the last 168 hours. No results for input(s): AMMONIA in the last 168 hours.  ABG    Component Value Date/Time   PHART 7.428 11/15/2020 1658   PCO2ART 32.9  11/16/2020 1658   PO2ART 408 (H) 11/17/2020 1658   HCO3 21.7 11/23/2020 1658   TCO2 23 11/14/2020 1658   ACIDBASEDEF 2.0 11/28/2020 1658   O2SAT 100.0 11/14/2020 1658     Coagulation Profile: No results for input(s): INR, PROTIME in the last 168 hours.  Cardiac Enzymes: No results for input(s): CKTOTAL, CKMB, CKMBINDEX, TROPONINI in the last 168 hours.  HbA1C: Hgb A1c MFr Bld  Date/Time Value Ref Range Status  11/19/2020 10:37 AM 5.0 4.8 - 5.6 % Final    Comment:    (NOTE) Pre diabetes:          5.7%-6.4%  Diabetes:              >6.4%  Glycemic control for   <7.0% adults with diabetes   12/17/2019 08:19 AM 4.5 (L) 4.8 - 5.6 % Final    Comment:    (NOTE) Pre diabetes:          5.7%-6.4% Diabetes:              >6.4% Glycemic control for   <7.0% adults with diabetes     CBG: Recent Labs  Lab 11/20/20 2321 11/21/20 0530 11/21/20 1157 11/21/20 1800 11/21/20 2351  GLUCAP 107* 126* 156* 127* 129*   CRITICAL CARE Performed by: Leanna Sato Elsworth Soho  The patient is critically ill with multiple organ systems failure and requires high complexity decision making for assessment and support, frequent evaluation and titration of therapies, application of advanced monitoring technologies and extensive interpretation of multiple databases. Critical Care Time devoted to patient care services described in this note independent of APP/resident  time is 35 minutes.   Kara Mead MD. Shade Flood. [facility] See Amion for pager  If no response to pager , please call [phone]  After 7:00 pm call Elink  [phone]    11/22/2020, 9:05 AM

## 2020-11-22 NOTE — Procedures (Signed)
Extubation Procedure Note  Patient Details:   Name: Ronald Wolf DOB: April 10, 1946 MRN: [id number]   Airway Documentation:    Vent end date: 11/22/20 Vent end time: 1210   Evaluation  O2 sats: stable throughout Complications: No apparent complications Patient did tolerate procedure well. Bilateral Breath Sounds: Clear,Diminished   Patient extubated per order to 4L [place] with no apparent complications, Positive cuff leak was noted prior to extubation. BBS rhonchi and patient has large amounts of thick secretions. RT and RN working with patient on coughing up secretions. Vitals are stable. RT will continue to monitor.   Jandi Swiger Clyda Greener 11/22/2020, 12:18 PM

## 2020-11-22 NOTE — Progress Notes (Signed)
4 Days Post-Op   Subjective/Chief Complaint: On vent sedated   Objective: Vital signs in last 24 hours: Temp:  [97.4 F (36.3 C)-99.5 F (37.5 C)] 97.4 F (36.3 C) (12/11 0800) Pulse Rate:  [75-123] 85 (12/11 0900) Resp:  [24-38] 27 (12/11 0900) BP: (71-208)/(51-99) 98/65 (12/11 0900) SpO2:  [93 %-100 %] 97 % (12/11 0900) FiO2 (%):  [30 %-40 %] 30 % (12/11 0900) Last BM Date: 11/22/20  Intake/Output from previous day: 12/10 0701 - 12/11 0700 In: 3528.4 [I.V.:2319; IV Piggyback:1179.4] Out: 2675 [Urine:2475; Emesis/NG output:200] Intake/Output this shift: Total I/O In: 210.4 [I.V.:204.1; IV Piggyback:6.3] Out: 250 [Urine:250]  Gen: sedated Heart: tachy Lungs: clear Abd: distended, g-tube to gravity with no output.   Midline wound is clean, muscle/fascia at base dusky   Lab Results:  Recent Labs    11/20/20 0732 11/21/20 0636  WBC 27.7* 19.2*  HGB 10.0* 9.6*  HCT 28.8* 28.2*  PLT 130* PLATELET CLUMPS NOTED ON SMEAR, COUNT APPEARS DECREASED   BMET Recent Labs    11/21/20 0500 11/22/20 0647  NA 140 140  K 3.3* 3.6  CL 108 107  CO2 22 20*  GLUCOSE 137* 155*  BUN 43* 54*  CREATININE 1.70* 1.47*  CALCIUM 7.0* 7.1*   PT/INR No results for input(s): LABPROT, INR in the last 72 hours. ABG No results for input(s): PHART, HCO3 in the last 72 hours.  Invalid input(s): PCO2, PO2  Studies/Results: DG CHEST PORT 1 VIEW  Result Date: 11/22/2020 CLINICAL DATA:  Intubation. EXAM: PORTABLE CHEST 1 VIEW COMPARISON:  November 21, 2020. FINDINGS: The heart size and mediastinal contours are within normal limits. Endotracheal and nasogastric tubes are in good position. Left subclavian catheter is noted with distal tip in expected position of cavoatrial junction. No pneumothorax is noted. Bibasilar atelectasis or infiltrates are noted with associated pleural effusions. The visualized skeletal structures are unremarkable. IMPRESSION: Endotracheal and nasogastric tubes in  good position. Bibasilar atelectasis or infiltrates are noted with associated pleural effusions. Electronically Signed   By: Marijo Conception M.D.   On: 11/22/2020 08:12   DG CHEST PORT 1 VIEW  Result Date: 11/21/2020 CLINICAL DATA:  Respiratory failure.  Ventilator support. EXAM: PORTABLE CHEST 1 VIEW COMPARISON:  11/30/2020 FINDINGS: Endotracheal tube 6 cm above the carina. Orogastric or nasogastric tube enters the abdomen. Left arm PICC tip in the proximal right atrium. Worsening of bilateral lower lobe volume loss, possibly with bilateral effusions. IMPRESSION: 1. Worsening of bilateral lower lobe volume loss, possibly with bilateral effusions. 2. Lines and tubes as above. PICC tip appears to be in the right atrium. Electronically Signed   By: Nelson Chimes M.D.   On: 11/21/2020 10:18    Anti-infectives: Anti-infectives (From admission, onward)   Start     Dose/Rate Route Frequency Ordered Stop   11/19/20 1130  piperacillin-tazobactam (ZOSYN) IVPB 3.375 g        3.375 g 12.5 mL/hr over 240 Minutes Intravenous Every 8 hours 11/19/20 1017     11/19/20 1115  fluconazole (DIFLUCAN) IVPB 200 mg        200 mg 100 mL/hr over 60 Minutes Intravenous Every 24 hours 11/19/20 1017 11/21/20 1104   11/19/20 0915  Ampicillin-Sulbactam (UNASYN) 3 g in sodium chloride 0.9 % 100 mL IVPB  Status:  Discontinued        3 g 200 mL/hr over 30 Minutes Intravenous Every 12 hours 11/19/20 0826 11/19/20 0949      Assessment/Plan: POD4, s/p ex lap with SBR,  placement of a gastrostomy tube for SBO -cont NGT and await bowel function. Anticipate prolonged post op ileus given nature of surgeryand feculent drainage from NGT -BID dressing changes to midline wound -g-tubeput to gravity to see if this will help with some decompression as he is still very distended. Krista Blue to 19K yesterday -appreciate ccm assistance FEN -NPO/NGT/TPN VTE -heparin sq ID -zosyn only needs this five days postop  Rolm Bookbinder 11/22/2020

## 2020-11-23 ENCOUNTER — Encounter (HOSPITAL_COMMUNITY): Admission: EM | Disposition: E | Payer: Self-pay | Source: Home / Self Care | Attending: Pulmonary Disease

## 2020-11-23 ENCOUNTER — Inpatient Hospital Stay (HOSPITAL_COMMUNITY): Payer: Medicare PPO | Admitting: Certified Registered"

## 2020-11-23 ENCOUNTER — Inpatient Hospital Stay (HOSPITAL_COMMUNITY): Payer: Medicare PPO

## 2020-11-23 HISTORY — PX: LAPAROTOMY: SHX154

## 2020-11-23 LAB — CBC
HCT: 28.6 % — ABNORMAL LOW (ref 39.0–52.0)
Hemoglobin: 9.2 g/dL — ABNORMAL LOW (ref 13.0–17.0)
MCH: 31.3 pg (ref 26.0–34.0)
MCHC: 32.2 g/dL (ref 30.0–36.0)
MCV: 97.3 fL (ref 80.0–100.0)
Platelets: 91 10*3/uL — ABNORMAL LOW (ref 150–400)
RBC: 2.94 MIL/uL — ABNORMAL LOW (ref 4.22–5.81)
RDW: 15 % (ref 11.5–15.5)
WBC: 16.9 10*3/uL — ABNORMAL HIGH (ref 4.0–10.5)
nRBC: 1 % — ABNORMAL HIGH (ref 0.0–0.2)

## 2020-11-23 LAB — COMPREHENSIVE METABOLIC PANEL
ALT: 13 U/L (ref 0–44)
AST: 31 U/L (ref 15–41)
Albumin: 1.3 g/dL — ABNORMAL LOW (ref 3.5–5.0)
Alkaline Phosphatase: 42 U/L (ref 38–126)
Anion gap: 13 (ref 5–15)
BUN: 57 mg/dL — ABNORMAL HIGH (ref 8–23)
CO2: 23 mmol/L (ref 22–32)
Calcium: 7.6 mg/dL — ABNORMAL LOW (ref 8.9–10.3)
Chloride: 105 mmol/L (ref 98–111)
Creatinine, Ser: 1.38 mg/dL — ABNORMAL HIGH (ref 0.61–1.24)
GFR, Estimated: 54 mL/min — ABNORMAL LOW (ref >60)
Glucose, Bld: 163 mg/dL — ABNORMAL HIGH (ref 70–99)
Potassium: 3.9 mmol/L (ref 3.5–5.1)
Sodium: 141 mmol/L (ref 135–145)
Total Bilirubin: 3 mg/dL — ABNORMAL HIGH (ref 0.3–1.2)
Total Protein: 4.6 g/dL — ABNORMAL LOW (ref 6.5–8.1)

## 2020-11-23 LAB — CULTURE, BLOOD (ROUTINE X 2)
Culture: NO GROWTH
Culture: NO GROWTH
Special Requests: ADEQUATE

## 2020-11-23 LAB — PHOSPHORUS: Phosphorus: 3.2 mg/dL (ref 2.5–4.6)

## 2020-11-23 LAB — GLUCOSE, CAPILLARY
Glucose-Capillary: 153 mg/dL — ABNORMAL HIGH (ref 70–99)
Glucose-Capillary: 185 mg/dL — ABNORMAL HIGH (ref 70–99)
Glucose-Capillary: 186 mg/dL — ABNORMAL HIGH (ref 70–99)
Glucose-Capillary: 264 mg/dL — ABNORMAL HIGH (ref 70–99)

## 2020-11-23 LAB — MAGNESIUM: Magnesium: 1.8 mg/dL (ref 1.7–2.4)

## 2020-11-23 SURGERY — LAPAROTOMY, EXPLORATORY
Anesthesia: General | Site: Abdomen

## 2020-11-23 MED ORDER — LACTATED RINGERS IV SOLN: INTRAVENOUS | Status: DC | PRN

## 2020-11-23 MED ORDER — TRAVASOL 10 % IV SOLN
INTRAVENOUS | Status: DC
  Filled 2020-11-23: qty 1209.6

## 2020-11-23 MED ORDER — ONDANSETRON HCL 4 MG/2ML IJ SOLN
INTRAMUSCULAR | Status: AC
  Filled 2020-11-23: qty 2

## 2020-11-23 MED ORDER — MAGNESIUM SULFATE 2 GM/50ML IV SOLN
2.0000 g | Freq: Once | INTRAVENOUS | Status: AC
  Administered 2020-11-23: 14:00:00 2 g via INTRAVENOUS
  Filled 2020-11-23 (×2): qty 50

## 2020-11-23 MED ORDER — FENTANYL CITRATE (PF) 250 MCG/5ML IJ SOLN
INTRAMUSCULAR | Status: AC
  Filled 2020-11-23: qty 5

## 2020-11-23 MED ORDER — MIDAZOLAM HCL 5 MG/5ML IJ SOLN
INTRAMUSCULAR | Status: DC | PRN
  Administered 2020-11-23: 1 mg via INTRAVENOUS

## 2020-11-23 MED ORDER — PHENYLEPHRINE HCL-NACL 10-0.9 MG/250ML-% IV SOLN
25.0000 ug/min | INTRAVENOUS | Status: DC
  Administered 2020-11-24: 07:00:00 25 ug/min via INTRAVENOUS
  Filled 2020-11-23 (×2): qty 250

## 2020-11-23 MED ORDER — PROPOFOL 10 MG/ML IV BOLUS
INTRAVENOUS | Status: AC
  Filled 2020-11-23: qty 20

## 2020-11-23 MED ORDER — SODIUM CHLORIDE 0.9 % IV SOLN
250.0000 mL | INTRAVENOUS | Status: DC
  Administered 2020-11-23 – 2021-01-18 (×5): 250 mL via INTRAVENOUS

## 2020-11-23 MED ORDER — FENTANYL CITRATE (PF) 100 MCG/2ML IJ SOLN
INTRAMUSCULAR | Status: DC | PRN
  Administered 2020-11-23: 50 ug via INTRAVENOUS

## 2020-11-23 MED ORDER — MIDAZOLAM HCL 2 MG/2ML IJ SOLN
INTRAMUSCULAR | Status: AC
  Filled 2020-11-23: qty 2

## 2020-11-23 MED ORDER — ROCURONIUM BROMIDE 100 MG/10ML IV SOLN
INTRAVENOUS | Status: DC | PRN
  Administered 2020-11-23 (×2): 20 mg via INTRAVENOUS
  Administered 2020-11-23: 50 mg via INTRAVENOUS

## 2020-11-23 MED ORDER — DEXAMETHASONE SODIUM PHOSPHATE 10 MG/ML IJ SOLN
INTRAMUSCULAR | Status: AC
  Filled 2020-11-23: qty 1

## 2020-11-23 MED ORDER — 0.9 % SODIUM CHLORIDE (POUR BTL) OPTIME
TOPICAL | Status: DC | PRN
  Administered 2020-11-23 (×2): 3000 mL

## 2020-11-23 MED ORDER — PHENYLEPHRINE HCL-NACL 10-0.9 MG/250ML-% IV SOLN
INTRAVENOUS | Status: DC | PRN
  Administered 2020-11-23: 20 ug/min via INTRAVENOUS

## 2020-11-23 MED ORDER — SODIUM CHLORIDE 0.9 % IV SOLN
INTRAVENOUS | Status: DC | PRN
  Administered 2020-11-23: 12:00:00 3.375 g via INTRAVENOUS

## 2020-11-23 MED ORDER — DEXAMETHASONE SODIUM PHOSPHATE 4 MG/ML IJ SOLN
INTRAMUSCULAR | Status: DC | PRN
  Administered 2020-11-23: 4 mg via INTRAVENOUS

## 2020-11-23 MED ORDER — PHENYLEPHRINE 40 MCG/ML (10ML) SYRINGE FOR IV PUSH (FOR BLOOD PRESSURE SUPPORT)
PREFILLED_SYRINGE | INTRAVENOUS | Status: DC | PRN
  Administered 2020-11-23: 80 ug via INTRAVENOUS
  Administered 2020-11-23 (×2): 40 ug via INTRAVENOUS
  Administered 2020-11-23: 80 ug via INTRAVENOUS

## 2020-11-23 SURGICAL SUPPLY — 57 items
ADH SKN CLS APL DERMABOND .7 (GAUZE/BANDAGES/DRESSINGS)
BLADE CLIPPER SURG (BLADE) IMPLANT
CANISTER SUCT 3000ML PPV (MISCELLANEOUS) IMPLANT
CHLORAPREP W/TINT 26 (MISCELLANEOUS) IMPLANT
COVER SURGICAL LIGHT HANDLE (MISCELLANEOUS) ×2 IMPLANT
COVER WAND RF STERILE (DRAPES) ×2 IMPLANT
DERMABOND ADVANCED (GAUZE/BANDAGES/DRESSINGS)
DERMABOND ADVANCED .7 DNX12 (GAUZE/BANDAGES/DRESSINGS) IMPLANT
DRAPE INCISE IOBAN 66X45 STRL (DRAPES) IMPLANT
DRAPE LAPAROSCOPIC ABDOMINAL (DRAPES) ×2 IMPLANT
DRAPE WARM FLUID 44X44 (DRAPES) ×2 IMPLANT
DRSG OPSITE POSTOP 4X10 (GAUZE/BANDAGES/DRESSINGS) IMPLANT
DRSG OPSITE POSTOP 4X8 (GAUZE/BANDAGES/DRESSINGS) IMPLANT
ELECT BLADE 6.5 EXT (BLADE) ×2 IMPLANT
ELECT CAUTERY BLADE 6.4 (BLADE) ×2 IMPLANT
ELECT REM PT RETURN 9FT ADLT (ELECTROSURGICAL) ×2
ELECTRODE REM PT RTRN 9FT ADLT (ELECTROSURGICAL) ×1 IMPLANT
GLOVE BIOGEL PI IND STRL 6 (GLOVE) ×2 IMPLANT
GLOVE BIOGEL PI IND STRL 8 (GLOVE) ×1 IMPLANT
GLOVE BIOGEL PI INDICATOR 6 (GLOVE) ×2
GLOVE BIOGEL PI INDICATOR 8 (GLOVE) ×1
GLOVE BIOGEL PI MICRO 5.5 (GLOVE) ×2
GLOVE BIOGEL PI MICRO STRL 5.5 (GLOVE) ×2 IMPLANT
GLOVE SKINSENSE NS SZ6.5 (GLOVE) ×3
GLOVE SKINSENSE NS SZ7.0 (GLOVE) ×2
GLOVE SKINSENSE STRL SZ6.5 (GLOVE) ×3 IMPLANT
GLOVE SKINSENSE STRL SZ7.0 (GLOVE) ×2 IMPLANT
GOWN STRL REUS W/ TWL LRG LVL3 (GOWN DISPOSABLE) ×3 IMPLANT
GOWN STRL REUS W/TWL LRG LVL3 (GOWN DISPOSABLE) ×6
HANDLE SUCTION POOLE (INSTRUMENTS) ×1 IMPLANT
KIT BASIN OR (CUSTOM PROCEDURE TRAY) ×2 IMPLANT
KIT OSTOMY DRAINABLE 2.75 STR (WOUND CARE) ×2 IMPLANT
KIT TURNOVER KIT B (KITS) ×2 IMPLANT
LIGASURE IMPACT 36 18CM CVD LR (INSTRUMENTS) ×2 IMPLANT
NS IRRIG 1000ML POUR BTL (IV SOLUTION) ×12 IMPLANT
PACK GENERAL/GYN (CUSTOM PROCEDURE TRAY) ×2 IMPLANT
PAD ARMBOARD 7.5X6 YLW CONV (MISCELLANEOUS) ×4 IMPLANT
PENCIL SMOKE EVACUATOR (MISCELLANEOUS) ×2 IMPLANT
RELOAD PROXIMATE 75MM BLUE (ENDOMECHANICALS) ×8 IMPLANT
SLEEVE SUCTION CATH 165 (SLEEVE) IMPLANT
SPECIMEN JAR LARGE (MISCELLANEOUS) IMPLANT
SPONGE LAP 18X18 RF (DISPOSABLE) ×2 IMPLANT
STAPLER PROXIMATE 75MM BLUE (STAPLE) ×2 IMPLANT
STAPLER VISISTAT 35W (STAPLE) IMPLANT
SUCTION POOLE HANDLE (INSTRUMENTS) ×2
SUT MNCRL AB 4-0 PS2 18 (SUTURE) IMPLANT
SUT PDS AB 1 TP1 96 (SUTURE) IMPLANT
SUT SILK 2 0 SH CR/8 (SUTURE) ×2 IMPLANT
SUT SILK 2 0 TIES 10X30 (SUTURE) ×2 IMPLANT
SUT SILK 3 0 SH CR/8 (SUTURE) ×2 IMPLANT
SUT SILK 3 0 TIES 10X30 (SUTURE) ×2 IMPLANT
SUT VIC AB 3-0 SH 18 (SUTURE) IMPLANT
SUT VIC AB 3-0 SH 27 (SUTURE) ×2
SUT VIC AB 3-0 SH 27XBRD (SUTURE) ×1 IMPLANT
TOWEL GREEN STERILE (TOWEL DISPOSABLE) ×2 IMPLANT
TRAY FOLEY MTR SLVR 16FR STAT (SET/KITS/TRAYS/PACK) IMPLANT
YANKAUER SUCT BULB TIP NO VENT (SUCTIONS) IMPLANT

## 2020-11-23 NOTE — Anesthesia Postprocedure Evaluation (Signed)
Anesthesia Post Note  Patient: ROBERTSON Wolf  Procedure(s) Performed: EXPLORATORY LAPAROTOMY (N/A Abdomen)     Patient location during evaluation: SICU Anesthesia Type: General Level of consciousness: sedated Pain management: pain level controlled Vital Signs Assessment: post-procedure vital signs reviewed and stable Respiratory status: patient remains intubated per anesthesia plan Cardiovascular status: stable Postop Assessment: no apparent nausea or vomiting Anesthetic complications: no   No complications documented.  Last Vitals:  Vitals:   11/16/2020 1345 12/03/2020 1400  BP: (!) 185/87 116/71  Pulse: (!) 117 (!) 111  Resp: 15 15  Temp:    SpO2: 100% 100%    Last Pain:  Vitals:   11/16/2020 0800  TempSrc: Axillary  PainSc:                  Ronald Wolf Anshi Jalloh

## 2020-11-23 NOTE — Progress Notes (Signed)
Patient back from OR, placed back on ventilator.

## 2020-11-23 NOTE — Anesthesia Preprocedure Evaluation (Addendum)
Anesthesia Evaluation  Patient identified by MRN, date of birth, ID band Patient awake    Reviewed: Patient's Chart, lab work & pertinent test results, reviewed documented beta blocker date and time   Airway Mallampati: Intubated       Dental   Pulmonary neg pulmonary ROS, Current Smoker,     + decreased breath sounds      Cardiovascular hypertension, Pt. on medications and Pt. on home beta blockers + Peripheral Vascular Disease   Rhythm:Regular Rate:Normal     Neuro/Psych Seizures -, Well Controlled,  Anxiety Depression CVA    GI/Hepatic GERD  Medicated,(+) Hepatitis -, CSBO here for ex lap   Endo/Other  negative endocrine ROS  Renal/GU  Bladder dysfunction  Bladder Ca s/p urostomy    Musculoskeletal  (+) Arthritis ,   Abdominal (+)  Abdomen: soft. Bowel sounds: normal.  Peds  Hematology  (+) anemia ,   Anesthesia Other Findings   Reproductive/Obstetrics                            Anesthesia Physical Anesthesia Plan  ASA: IV  Anesthesia Plan: General   Post-op Pain Management:    Induction: Intravenous  PONV Risk Score and Plan: 1 and Ondansetron, Dexamethasone and Treatment may vary due to age or medical condition  Airway Management Planned: Mask and Oral ETT  Additional Equipment: None  Intra-op Plan:   Post-operative Plan: Post-operative intubation/ventilation  Informed Consent: I have reviewed the patients History and Physical, chart, labs and discussed the procedure including the risks, benefits and alternatives for the proposed anesthesia with the patient or authorized representative who has indicated his/her understanding and acceptance.     Dental advisory given  Plan Discussed with: CRNA  Anesthesia Plan Comments: (Lab Results      Component                Value               Date                      WBC                      19.2 (H)            11/21/2020                 HGB                      7.1 (L)             11/22/2020                HCT                      21.0 (L)            11/22/2020                MCV                      96.6                11/21/2020                PLT  11/21/2020            PLATELET CLUMPS NOTED ON SMEAR, COUNT APPEARS DECREASED Lab Results      Component                Value               Date                      NA                       142                 11/22/2020                K                        3.7                 11/22/2020                CO2                      20 (L)              11/22/2020                GLUCOSE                  155 (H)             11/22/2020                BUN                      54 (H)              11/22/2020                CREATININE               1.47 (H)            11/22/2020                CALCIUM                  7.1 (L)             11/22/2020                GFRNONAA                 50 (L)              11/22/2020                GFRAA                    >60                 12/31/2019            ECHO 1/21: Left ventricular ejection fraction, by visual estimation, is 60 to 65%. The left ventricle has normal function. There is moderately increased left ventricular hypertrophy. 2. Left ventricular diastolic parameters are consistent with Grade I diastolic dysfunction (impaired relaxation). 3. The left ventricle has no regional wall motion abnormalities. 4. Global right ventricle has normal systolic function.The right ventricular size is normal. No  increase in right ventricular wall thickness. 5. Left atrial size was normal. 6. Right atrial size was normal. 7. The mitral valve is normal in structure. No evidence of mitral valve regurgitation. 8. The tricuspid valve is normal in structure. 9. The aortic valve is normal in structure. Aortic valve regurgitation is not visualized. 10. The pulmonic valve was normal in structure.  Pulmonic valve regurgitation is not visualized. 11. Patient would not cooperate with completion of echo. 12. The atrial septum is grossly normal.)       Anesthesia Quick Evaluation

## 2020-11-23 NOTE — Transfer of Care (Signed)
Immediate Anesthesia Transfer of Care Note  Patient: Ronald Wolf  Procedure(s) Performed: EXPLORATORY LAPAROTOMY (N/A Abdomen)  Patient Location: PACU and ICU  Anesthesia Type:General  Level of Consciousness: sedated, pateint uncooperative and Patient remains intubated per anesthesia plan  Airway & Oxygen Therapy: Patient remains intubated per anesthesia plan and Patient placed on Ventilator (see vital sign flow sheet for setting)  Post-op Assessment: Report given to RN and Post -op Vital signs reviewed and stable  Post vital signs: Reviewed and stable  Last Vitals:  Vitals Value Taken Time  BP    Temp    Pulse    Resp    SpO2      Last Pain:  Vitals:   11/30/2020 0800  TempSrc: Axillary  PainSc:          Complications: No complications documented.

## 2020-11-23 NOTE — OR Nursing (Signed)
During closing count it was discovered that a 2-0 silk SH needle was missing . Doctor informed, roomed searched, then hospital protocol followed. After hospital protocol was initiated and x-Ray taken the 2-0 silk SH needle was found on the floor under the OR table.  Radiologist Legrand Pitts  read X-ray and confirmed that no foreign objects were retained in surgical wound.

## 2020-11-23 NOTE — Progress Notes (Signed)
NAME:  Ronald Wolf, MRN:  [id number], DOB:  Jun 28, 1946, LOS: 54 ADMISSION DATE:  12/08/2020, CONSULTATION DATE:  12/10/2020 REFERRING MD: Cyndia Skeeters CHIEF COMPLAINT:  hypotension   Brief History   Ronald Wolf is a 74 yo M w/ PMH of CVA, Seizures, Dementia, Bladder ca s/p urostomy, tobacco use, right glottic carcinoma s/p excision presenting with sbo. Found to have ischemic bowel requiring ex-lap. PCCM consulted due to post-op hypotension / respiratory distress  History of present illness   Ronald Wolf is a 74 yo M w/ above PMH who initially presented to [facility] hospital on 11/11/20 with abdominal pain and nausea of 4-5 day duration. He was found to have SBO but left against medical advice as his family did not feel comfortable with him receiving his medical care in [place]. He came to [facility] ED the next day with similar complaint and was found to have SBO. He was treated with supportive care with bowel rest and ng suction. However his symptoms did not improve. Surgery team was consulted and he was brough to OR for exploratory laparoscopy. He was found to have multiple sections of ischemic bowel which were resected. He was unable to be extubated post-operatively and PCCM was consulted.  Past Medical History  CVA, Dementia, Bladder CA s/p nephrostomy, tobacco use, right glottic carcinoma s/p excision, seizures.  Significant Hospital Events   12/06/2020 Admission 11/13/2020 OR 12/09/2020 Admit to ICU 12/10 Brief SVT with associated hypertension. Self limited Given fentanyl, metop and hydral for HTN 12/11 Good urine output with Lasix , failed extubation, reintubated within 2 hours due to inability to clear secretions  Consults:  General Surgery PCCM Procedures:  11/14/2020 Ex-lap, G-tube placement ETT 12/7 >> 12/11, >>  Significant Diagnostic Tests:   CT abd/pelvis 12/2 > High-grade distal small bowel obstruction, possibly due to adhesion. Mild ascites and diffuse mesenteric edema. No evidence of  focal inflammatory process or abscess. Small bilateral pleural effusions and mild right lower lobe Atelectasis.    Micro Data:  12/01/2020 COVID/Flu negative 12/7 BA: Few GNR rare GPC -- predominantly PMN  12/7 BCx> NGTD  12/11 respiratory >>  Antimicrobials:  Fluc 12/8>> 12/10 Zosyn 12/8>>   Interim history/subjective:   Reintubated yesterday due to inability to clear secretions and work of breathing. Remains critically ill, intubated On low-dose Precedex Afebrile Good urine output with Lasix  Objective   Blood pressure 130/71, pulse (!) 109, temperature 99.1 F (37.3 C), temperature source Axillary, resp. rate (!) 28, height 5\' 11"  (1.803 m), weight 62.7 kg, SpO2 100 %.    Vent Mode: CPAP;PSV FiO2 (%):  [30 %-100 %] 40 % Set Rate:  [15 bmp] 15 bmp Vt Set:  [450 mL] 450 mL PEEP:  [5 cmH20] 5 cmH20 Pressure Support:  [5 cmH20-10 cmH20] 10 cmH20 Plateau Pressure:  [14 cmH20-21 cmH20] 20 cmH20   Intake/Output Summary (Last 24 hours) at 11/16/2020 0851 Last data filed at 11/29/2020 0600 Gross per 24 hour  Intake 3896.42 ml  Output 3200 ml  Net 696.42 ml   Filed Weights   11/19/20 0500 11/20/20 0500 11/21/20 0400  Weight: 60 kg 66.7 kg 62.7 kg    Examination: Gen:      elderly man, no distress , flat affect HEENT:  EOMI, sclera anicteric, mild pallor Neck:     No JVD; no thyromegaly Lungs:    Bilateral ventilated breath sounds, no accessory muscle use CV:         Regular rate and rhythm; no murmurs Abd:      +  bowel sounds; soft, non-tender; no palpable masses, decreased distension , G-tube to gravity Ext:    No edema; adequate peripheral perfusion Skin:      Warm and dry; no rash Neuro: Follows one-step commands, appears weak and deconditioned, grossly nonfocal gu -right upper quadrant urostomy   Chest x-ray post reintubation shows ET tube & NG tube in good position, minimal layering effusions X-ray abdomen shows gaseous distention of stomach and bowel gas  consistent with ileus  Labs pending from this morning, improving renal function ABG appears normal post intubation  Resolved Hospital Problem list   N/A  Assessment & Plan:   Acute encephalopathy, improving  -multifactorial,  at risk for ICU delirium, encephalopathy due to sepsis, CNS depressing meds/anesthesia, AKI  P -Can minimize Precedex and use as needed fentanyl, goal RASS 0 -delirium precautions   Acute hypoxic respiratory failure requiring intubation  -Patient continued to require intubation post operatively, unable to be weaned off of the vent. Chest X-ray performed, revealing right sided hemiopacity/atelectasis -Bronch done 12/7 with mucus plugging seen P: -WUA/SBT -Failed extubation 12/11, would like general condition to improve prior to another trial of extubation at some point  SBO complicated by ischemic bowel with perforation s/p ex-lap with anastomosis and G tube placement. - Ex-lap 12/7 with findings of ischemic bowel, s/p resection and G-tube placement.  -Abdominal distention improved 12/12 Post op ileus  P: -post op per CCS -TPN via PICC  -Continue NGT to LIWS, G tube to gravity. No G tube output yet.  Septic shock, resolved -off pressors 12/10 Peritonitis P -cont Zosyn x 7 ds until 12/14 -Leukocytosis trending down  Acute Kidney Injury, improving  -Cr continuing to downtrend, UOP continuing to improve  P: -Monitor while on Lasix Hypokalemia  repleted  SVT, self limited, with associated hypertension   Hypertension  P -Schedule metoprolol 2.5 every 12  Protein calorie malnutrition, moderate -TNA per pharmacy  Seizure Disorder -Has hx of epilepsy confirmed via EEG 12/24/19. On levetiracetam at home. P: -Cont Keppra  Hx of Bladder Cancer -Bladder cancer s/p urostomy placement P: -urostomy care  Anemia -component of Fe def P -defer IV iron  Summary -poor baseline due to multiple medical problems, post emergent surgery with slow  recovery, failed extubation due to general condition and inability to clear secretions.  Hopeful for another extubation trial once we optimize  Best practice (evaluated daily)  Diet: TPN  Pain/Anxiety/Delirium protocol (if indicated): Fentanyl as needed -Precedex PRN fent  VAP protocol (if indicated): Yes  DVT prophylaxis: SQH  GI prophylaxis: PPI Glucose control: SSI  Mobility:BR  Family updated --goals of care 12/11 with sister, Clarene Critchley and daughter Derrick Ravel on conference call, full CODE STATUS, they would not want permanent tubes but desire re- intubation as needed  Code Status: Full Disposition: ICU  Labs   CBC: Recent Labs  Lab 11/17/20 0431 12/03/2020 0340 12/06/2020 1748 11/19/20 0526 11/20/20 0732 11/21/20 0636 11/22/20 1526  WBC 7.9  --  9.3 31.4* 27.7* 19.2*  --   NEUTROABS  --   --   --  26.4*  --   --   --   HGB 11.5*   < > 14.1 11.7* 10.0* 9.6* 7.1*  HCT 35.3*   < > 42.6 34.8* 28.8* 28.2* 21.0*  MCV 101.7*  --  94.7 96.7 96.0 96.6  --   PLT 302  --  204 159 130* PLATELET CLUMPS NOTED ON SMEAR, COUNT APPEARS DECREASED  --    < > = values in  this interval not displayed.    Basic Metabolic Panel: Recent Labs  Lab 11/24/2020 0340 11/27/2020 1658 12/05/2020 1748 11/19/20 0526 11/19/20 0732 11/20/20 0458 11/21/20 0500 11/22/20 0647 11/22/20 1526  NA 141   < > 139 139  --  140 140 140 142  K 3.9   < > 3.4* 4.3  --  3.3* 3.3* 3.6 3.7  CL 106  --  110 109  --  108 108 107  --   CO2 25  --  18* 15*  --  21* 22 20*  --   GLUCOSE 131*  --  128* 117*  --  176* 137* 155*  --   BUN 14  --  17 21  --  34* 43* 54*  --   CREATININE 1.24  --  1.31* 2.08*  --  1.86* 1.70* 1.47*  --   CALCIUM 8.1*  --  7.5* 7.3*  --  7.0* 7.0* 7.1*  --   MG 1.8  --   --  1.7  --  2.1 2.5* 1.8  --   PHOS 2.8  --   --  6.3* 5.6* 2.8 2.5 3.1  --    < > = values in this interval not displayed.   GFR: Estimated Creatinine Clearance: 39.1 mL/min (A) (by C-G formula based on SCr of 1.47 mg/dL  (H)). Recent Labs  Lab 11/16/2020 1748 11/16/2020 2109 11/19/20 0526 11/19/20 1037 11/19/20 1450 11/20/20 0732 11/21/20 0636  WBC 9.3  --  31.4*  --   --  27.7* 19.2*  LATICACIDVEN 2.6* 3.1*  --  3.5* 2.9*  --   --     Liver Function Tests: Recent Labs  Lab 11/17/20 0431 12/06/2020 0340 11/19/20 0526 11/20/20 0458  AST 17  --  24 26  ALT 15  --  11 12  ALKPHOS 46  --  27* 30*  BILITOT 1.2  --  2.7* 2.2*  PROT 5.6*  --  4.4* 3.6*  ALBUMIN 2.7* 2.5* 2.7* 1.7*   No results for input(s): LIPASE, AMYLASE in the last 168 hours. No results for input(s): AMMONIA in the last 168 hours.  ABG    Component Value Date/Time   PHART 7.407 11/22/2020 1526   PCO2ART 39.0 11/22/2020 1526   PO2ART 233 (H) 11/22/2020 1526   HCO3 24.4 11/22/2020 1526   TCO2 26 11/22/2020 1526   ACIDBASEDEF 2.0 12/06/2020 1658   O2SAT 100.0 11/22/2020 1526     Coagulation Profile: No results for input(s): INR, PROTIME in the last 168 hours.  Cardiac Enzymes: No results for input(s): CKTOTAL, CKMB, CKMBINDEX, TROPONINI in the last 168 hours.  HbA1C: Hgb A1c MFr Bld  Date/Time Value Ref Range Status  11/19/2020 10:37 AM 5.0 4.8 - 5.6 % Final    Comment:    (NOTE) Pre diabetes:          5.7%-6.4%  Diabetes:              >6.4%  Glycemic control for   <7.0% adults with diabetes   12/17/2019 08:19 AM 4.5 (L) 4.8 - 5.6 % Final    Comment:    (NOTE) Pre diabetes:          5.7%-6.4% Diabetes:              >6.4% Glycemic control for   <7.0% adults with diabetes     CBG: Recent Labs  Lab 11/21/20 2351 11/22/20 1150 11/22/20 1809 11/22/20 2324 12/03/2020 0616  GLUCAP 129* 147* 154*  112* 153*   CRITICAL CARE Performed by: Leanna Sato. Elsworth Soho  The patient is critically ill with multiple organ systems failure and requires high complexity decision making for assessment and support, frequent evaluation and titration of therapies, application of advanced monitoring technologies and extensive  interpretation of multiple databases. Critical Care Time devoted to patient care services described in this note independent of APP/resident  time is 32 minutes.    Kara Mead MD. Shade Flood.  Pulmonary & Critical care See Amion for pager  If no response to pager , please call [phone]  After 7:00 pm call Elink  [phone]    12/10/2020, 8:51 AM

## 2020-11-23 NOTE — Progress Notes (Signed)
PHARMACY - TOTAL PARENTERAL NUTRITION CONSULT NOTE  Indication: Small bowel obstruction, prolonged ileus  Patient Measurements: Height: 5\' 11"  (180.3 cm) Weight: 61.2 kg (134 lb 14.7 oz) IBW/kg (Calculated) : 75.3 TPN AdjBW (KG): 60.8 Body mass index is 18.82 kg/m. Usual Weight: 137 lbs  Assessment:  74 YOM presented 12/06/2020 with nausea, vomiting, abdominal pain after leaving [facility]MA at [facility] with known SBO. Patient has been on bowel rest with NG tube decompression. Patient continued to have persistent SBO on imaging with no evidence of improvement, now s/p ex-lap with LoA, SBR and placement of Gtube on 11/23/2020. Pharmacy consulted for TPN management.  Expected prolonged ileus.  Patient remains in ICU, extubated and then re-intubated on 11/22/20, sedated on fentanyl PRN + Precedex.  Glucose / Insulin: no hx DM - CBGs <180. Utilized 5 units SSI in last 24hrs Electrolytes: K up to 3.9 (s/p 4 K runs; goal >/= 4 for ileus), Mag stable 1.8 (s/p Mag sulf 1g IV x 1 yesterday; goal >/=2 with ileus), low iCal 1.05 (corr Ca WNL); others WNL Renal: AKI improving - SCr down to 1.38 (BL 0.9-1.1), BUN up to 57. S/p Lasix 40 IV x 1 again yesterday LFTs / TGs: LFTs / TG WNL, Tbili up to 3 Prealbumin / albumin: BL prealbumin low 6.7, albumin 1.3 Intake / Output; MIVF: UOP 2.1 ml/kg/hr, NGT output 455mL/24hrs, net +11.9L since admit; LBM 12/12 GI Imaging: 12/11 DG Abd - stomach gas-filled, without distention, no obvious free air Surgeries / Procedures: 12/12 back to OR for re-exploration and washout with new copious drainage from incision concerning for enteric leak vs. enterotomy  Central access: PICC placed 11/12/2020 TPN start date: 11/19/20   Nutritional Goals (per RD recommendations 12/8): 2100-2300 kCal, 120-135g protein, >2L fluid per day  Current Nutrition:  TPN; NPO (Goal Pivot 1.5 = 60 ml/hr when able to start)  Plan:  Continue TPN at goal rate 90 ml/hr at 1800 TPN will provide 121g AA, 389g  CHO, and 31g SMOF lipids (15% of total kCal due to [employer]), for a total of 2120 kCal, meeting 100% of needs. Electrolytes in TPN: increase K to 21mEq/L and Mag to 57mEq/L; otherwise, continue same today - Na 65mEq/L, Ca 42mEq/L, Phos 15 mmol/L, max acetate Give Mag sulfate 2g IV x 1 Add standard daily multivitamin and trace elements + folic acid 1mg  (on supplementation PTA) in TPN Continue sensitive SSI Q6H F/U AM labs, Surgery plans, watch volume status   [name], PharmD, BCPS Please check AMION for all [place] contact numbers Clinical Pharmacist 12/01/2020 8:05 AM

## 2020-11-23 NOTE — Plan of Care (Signed)
Pt is still on ventilator, had to return to OR today. Pt is not progressing. Continue to monitor.Ronald Wolf

## 2020-11-23 NOTE — Op Note (Addendum)
Date: 11/18/2020  Patient: Ronald Wolf MRN: [id number]  Preoperative Diagnosis: Enteric leak Postoperative Diagnosis: Small bowel ischemia  Procedure: Reopening of recent laparotomy, small bowel resection without reanastomosis, abdominal washout, placement of negative pressure abdominal dressing  Surgeon: Michaelle Birks, MD Assistant: Sheria Lang, MD  EBL: Minimal  Anesthesia: General  Specimens: Small bowel  Indications: 74 yo male s/p ex lap and SBR for bowel ischemia. Has not progressed in last few days, with new copious drainage from incision concerning for anastomotic leak vs missed enterotomy. Will take back to OR today for re-exploration and washout.   Findings: Necrosis of a large segment of small bowel (estimated length of 60-70cm) including the previous anastomosis, with gross leakage of enteric contents. Necrotic segment was resected. Patient left in discontinuity with temporary abdominal closure.  Procedure details:  The patient was seen again in the Holding Room. The risks, benefits, complications, treatment options, and expected outcomes were discussed with the family over the phone. The family concurred with the proposed plan, giving informed consent. The site of surgery properly noted. The patient was taken to Operating Room, identified as Ronald Wolf and the procedure verified. A Time Out was held and the above information confirmed.  Antibiotic prophylaxis was administered. The patient was already intubated at sedated when he arrived in the operating room. The abdomen was prepped with betadine and draped in sterile fashion. The patient was positioned in the supine position.  His prior midline laparotomy was reopened. Gross feculent was immediately encountered on entry the abdomen. Loops of small bowel were matted together with overlying fibrinous exudate. A long sharply demarcated necrotic portion of small bowel was immediately identified and was separated from adjacent  perfused bowel with gentle blunt dissection. The necrotic bowel was very friable and had multiple enterotomies along it's length. The associated mesentery was very thickened but it appeared the mesentery may have twisted, leading to the ischemia. The distal end of the necrotic segment contained the previous anastomosis. A mesenteric defect was made under a portion of viable appearing proximal small bowel and the bowel was divided with a 75 mm GIA linear stapler. Likewise, a mesenteric defect was made under viable appearing small bowel just distal to the anastomosis and the bowel was divided with a 75 mm GIA linear stapler. The mesentery between the transected bowel was divided with ligasure and the specimen was passed off the field. The entirety of the remaining small bowel could not be safely run because of the presence of adhesions and friable tissue, however everything visualized was viable in appearance. The decision was made to leave the patient in discontinuity due to tenuous appearing small bowel at the transection points. The abdomen was copiously irrigated with warm saline solution. The abdomen was left open and an Abthera negative pressure dressing was applied in standard fashion.   The patient was left intubated and transported back to the ICU following the procedure for further postoperative care.Michaelle Birks, MD 12/02/2020 11:48 PM

## 2020-11-23 NOTE — Progress Notes (Signed)
Day of Surgery  Subjective: Patient has had copious drainage from midline incision, which started overnight. Labs are still pending this morning. Abdomen remains distended.   Objective: Vital signs in last 24 hours: Temp:  [97.3 F (36.3 C)-100 F (37.8 C)] 99.1 F (37.3 C) (12/12 0800) Pulse Rate:  [34-126] 103 (12/12 0900) Resp:  [22-36] 24 (12/12 0900) BP: (64-181)/(49-96) 129/67 (12/12 0900) SpO2:  [92 %-100 %] 100 % (12/12 0900) FiO2 (%):  [30 %-100 %] 40 % (12/12 0756) Last BM Date: 11/20/2020  Intake/Output from previous day: 12/11 0701 - 12/12 0700 In: 4115.7 [I.V.:2435.7; IV Piggyback:1680] Out: 3450 [Urine:3150; Emesis/NG output:300] Intake/Output this shift: Total I/O In: 215.7 [I.V.:206; IV Piggyback:9.7] Out: -   PE: General: resting in bed, NAD HEENT: ETT and NG in place. Bilious NG drainage. CV: tachycardic Abdomen: distended but compressible, copious foul-smelling bile-tinged drainage from midline incision. Skin is open, lower midline fascia is dusky but in tact. Abdomen is tender to palpation. RLQ ileal conduit is pink and productive of urine.  Lab Results:  Recent Labs    11/21/20 0636 11/22/20 1526  WBC 19.2*  --   HGB 9.6* 7.1*  HCT 28.2* 21.0*  PLT PLATELET CLUMPS NOTED ON SMEAR, COUNT APPEARS DECREASED  --    BMET Recent Labs    11/21/20 0500 11/22/20 0647 11/22/20 1526  NA 140 140 142  K 3.3* 3.6 3.7  CL 108 107  --   CO2 22 20*  --   GLUCOSE 137* 155*  --   BUN 43* 54*  --   CREATININE 1.70* 1.47*  --   CALCIUM 7.0* 7.1*  --    PT/INR No results for input(s): LABPROT, INR in the last 72 hours. CMP     Component Value Date/Time   NA 142 11/22/2020 1526   NA 145 03/08/2016 1356   K 3.7 11/22/2020 1526   K 4.5 03/08/2016 1356   CL 107 11/22/2020 0647   CO2 20 (L) 11/22/2020 0647   CO2 28 03/08/2016 1356   GLUCOSE 155 (H) 11/22/2020 0647   GLUCOSE 98 03/08/2016 1356   BUN 54 (H) 11/22/2020 0647   BUN 14.2 03/08/2016 1356    CREATININE 1.47 (H) 11/22/2020 0647   CREATININE 0.9 03/08/2016 1356   CALCIUM 7.1 (L) 11/22/2020 0647   CALCIUM 9.8 03/08/2016 1356   PROT 3.6 (L) 11/20/2020 0458   PROT 8.2 03/08/2016 1356   ALBUMIN 1.7 (L) 11/20/2020 0458   ALBUMIN 3.6 03/08/2016 1356   AST 26 11/20/2020 0458   AST 42 (H) 03/08/2016 1356   ALT 12 11/20/2020 0458   ALT 43 03/08/2016 1356   ALKPHOS 30 (L) 11/20/2020 0458   ALKPHOS 79 03/08/2016 1356   BILITOT 2.2 (H) 11/20/2020 0458   BILITOT 0.74 03/08/2016 1356   GFRNONAA 50 (L) 11/22/2020 0647   GFRAA >60 12/31/2019 0221   Lipase     Component Value Date/Time   LIPASE 57 (H) 12/03/2020 1830       Studies/Results: DG Abd 1 View  Result Date: 11/22/2020 CLINICAL DATA:  Abdominal distension EXAM: ABDOMEN - 1 VIEW COMPARISON:  None. FINDINGS: Esophagogastric tube is positioned with tip and side port below the diaphragm. Percutaneous gastrostomy tube. The stomach is gas filled. General paucity of bowel gas without distention. No obvious free air in the abdomen on supine radiographs. Postoperative findings in the pelvis, likely reflecting prostatectomy. IMPRESSION: 1. Esophagogastric tube is positioned with tip and side port below the diaphragm. 2.  Percutaneous gastrostomy tube. 3. The stomach is gas filled. General paucity of bowel gas without distention. No obvious free air in the abdomen on supine radiographs. Electronically Signed   By: Eddie Candle M.D.   On: 11/22/2020 14:54   Portable Chest x-ray  Result Date: 11/22/2020 CLINICAL DATA:  74 year old male status post intubation. EXAM: PORTABLE CHEST 1 VIEW COMPARISON:  Earlier the same day FINDINGS: The heart size and mediastinal contours are within normal limits. Unchanged position of indwelling endotracheal tube with the tip in the midthoracic trachea and left upper extremity PICC line with the tip in the right atrium. Enteric tube courses off the inferior aspect of this image. No new focal  consolidations. Probable trace bilateral pleural effusions bibasilar subsegmental atelectasis. No pneumothorax. Atherosclerotic calcification of the thoracic aorta. No acute osseous abnormality. IMPRESSION: No significant change from prior. Stable position of endotracheal tube. Similar appearing trace bilateral pleural effusions and bibasilar subsegmental atelectasis. Electronically Signed   By: [name] MD   On: 11/22/2020 14:54   DG CHEST PORT 1 VIEW  Result Date: 11/22/2020 CLINICAL DATA:  Intubation. EXAM: PORTABLE CHEST 1 VIEW COMPARISON:  November 21, 2020. FINDINGS: The heart size and mediastinal contours are within normal limits. Endotracheal and nasogastric tubes are in good position. Left subclavian catheter is noted with distal tip in expected position of cavoatrial junction. No pneumothorax is noted. Bibasilar atelectasis or infiltrates are noted with associated pleural effusions. The visualized skeletal structures are unremarkable. IMPRESSION: Endotracheal and nasogastric tubes in good position. Bibasilar atelectasis or infiltrates are noted with associated pleural effusions. Electronically Signed   By: Marijo Conception M.D.   On: 11/22/2020 08:12    Anti-infectives: Anti-infectives (From admission, onward)   Start     Dose/Rate Route Frequency Ordered Stop   11/19/20 1130  piperacillin-tazobactam (ZOSYN) IVPB 3.375 g        3.375 g 12.5 mL/hr over 240 Minutes Intravenous Every 8 hours 11/19/20 1017 11/20/2020 2359   11/19/20 1115  fluconazole (DIFLUCAN) IVPB 200 mg        200 mg 100 mL/hr over 60 Minutes Intravenous Every 24 hours 11/19/20 1017 11/21/20 1104   11/19/20 0915  Ampicillin-Sulbactam (UNASYN) 3 g in sodium chloride 0.9 % 100 mL IVPB  Status:  Discontinued        3 g 200 mL/hr over 30 Minutes Intravenous Every 12 hours 11/19/20 0826 11/19/20 0949       Assessment/Plan 74 yo male s/p ex lap and SBR for bowel ischemia. Has not progressed in last few days, with new  copious drainage from incision concerning for enteric leak vs enterotomy. Will take back to OR today for re-exploration and washout. I discussed this with Dr. Elsworth Soho and with the patient's daughter Derrick Ravel over the phone. [name] provided consent for surgery. Proceed to OR this morning. Continue NG and G tube decompression.   LOS: 11 days   Michaelle Birks, MD [facility] General, Hepatobiliary and Pancreatic Surgery 12/06/2020 10:30 AM

## 2020-11-24 ENCOUNTER — Encounter (HOSPITAL_COMMUNITY): Payer: Self-pay | Admitting: Surgery

## 2020-11-24 ENCOUNTER — Inpatient Hospital Stay (HOSPITAL_COMMUNITY): Payer: Medicare PPO

## 2020-11-24 DIAGNOSIS — R579 Shock, unspecified: Secondary | ICD-10-CM

## 2020-11-24 DIAGNOSIS — I10 Essential (primary) hypertension: Secondary | ICD-10-CM

## 2020-11-24 DIAGNOSIS — Z8673 Personal history of transient ischemic attack (TIA), and cerebral infarction without residual deficits: Secondary | ICD-10-CM

## 2020-11-24 DIAGNOSIS — J9600 Acute respiratory failure, unspecified whether with hypoxia or hypercapnia: Secondary | ICD-10-CM

## 2020-11-24 DIAGNOSIS — Z8551 Personal history of malignant neoplasm of bladder: Secondary | ICD-10-CM

## 2020-11-24 DIAGNOSIS — J9601 Acute respiratory failure with hypoxia: Secondary | ICD-10-CM

## 2020-11-24 DIAGNOSIS — G9340 Encephalopathy, unspecified: Secondary | ICD-10-CM

## 2020-11-24 DIAGNOSIS — J9690 Respiratory failure, unspecified, unspecified whether with hypoxia or hypercapnia: Secondary | ICD-10-CM

## 2020-11-24 DIAGNOSIS — E872 Acidosis: Secondary | ICD-10-CM

## 2020-11-24 LAB — POCT I-STAT 7, (LYTES, BLD GAS, ICA,H+H)
Acid-Base Excess: 3 mmol/L — ABNORMAL HIGH (ref 0.0–2.0)
Bicarbonate: 29.9 mmol/L — ABNORMAL HIGH (ref 20.0–28.0)
Calcium, Ion: 1.02 mmol/L — ABNORMAL LOW (ref 1.15–1.40)
HCT: 23 % — ABNORMAL LOW (ref 39.0–52.0)
Hemoglobin: 7.8 g/dL — ABNORMAL LOW (ref 13.0–17.0)
O2 Saturation: 100 %
Patient temperature: 97.8
Potassium: 5.1 mmol/L (ref 3.5–5.1)
Sodium: 142 mmol/L (ref 135–145)
TCO2: 32 mmol/L (ref 22–32)
pCO2 arterial: 58.8 mmHg — ABNORMAL HIGH (ref 32.0–48.0)
pH, Arterial: 7.313 — ABNORMAL LOW (ref 7.350–7.450)
pO2, Arterial: 224 mmHg — ABNORMAL HIGH (ref 83.0–108.0)

## 2020-11-24 LAB — COMPREHENSIVE METABOLIC PANEL
ALT: 16 U/L (ref 0–44)
AST: 33 U/L (ref 15–41)
Albumin: 1.3 g/dL — ABNORMAL LOW (ref 3.5–5.0)
Alkaline Phosphatase: 56 U/L (ref 38–126)
Anion gap: 6 (ref 5–15)
BUN: 64 mg/dL — ABNORMAL HIGH (ref 8–23)
CO2: 28 mmol/L (ref 22–32)
Calcium: 7.6 mg/dL — ABNORMAL LOW (ref 8.9–10.3)
Chloride: 108 mmol/L (ref 98–111)
Creatinine, Ser: 1.45 mg/dL — ABNORMAL HIGH (ref 0.61–1.24)
GFR, Estimated: 51 mL/min — ABNORMAL LOW (ref >60)
Glucose, Bld: 280 mg/dL — ABNORMAL HIGH (ref 70–99)
Potassium: 5.6 mmol/L — ABNORMAL HIGH (ref 3.5–5.1)
Sodium: 142 mmol/L (ref 135–145)
Total Bilirubin: 1.7 mg/dL — ABNORMAL HIGH (ref 0.3–1.2)
Total Protein: 4.8 g/dL — ABNORMAL LOW (ref 6.5–8.1)

## 2020-11-24 LAB — CBC WITH DIFFERENTIAL/PLATELET
Abs Immature Granulocytes: 1.4 10*3/uL — ABNORMAL HIGH (ref 0.00–0.07)
Band Neutrophils: 14 %
Basophils Absolute: 0 10*3/uL (ref 0.0–0.1)
Basophils Relative: 0 %
Eosinophils Absolute: 0 10*3/uL (ref 0.0–0.5)
Eosinophils Relative: 0 %
HCT: 32.1 % — ABNORMAL LOW (ref 39.0–52.0)
Hemoglobin: 9.6 g/dL — ABNORMAL LOW (ref 13.0–17.0)
Lymphocytes Relative: 8 %
Lymphs Abs: 2.2 10*3/uL (ref 0.7–4.0)
MCH: 31.2 pg (ref 26.0–34.0)
MCHC: 29.9 g/dL — ABNORMAL LOW (ref 30.0–36.0)
MCV: 104.2 fL — ABNORMAL HIGH (ref 80.0–100.0)
Monocytes Absolute: 1.7 10*3/uL — ABNORMAL HIGH (ref 0.1–1.0)
Monocytes Relative: 6 %
Myelocytes: 5 %
Neutro Abs: 22.3 10*3/uL — ABNORMAL HIGH (ref 1.7–7.7)
Neutrophils Relative %: 67 %
Platelets: 103 10*3/uL — ABNORMAL LOW (ref 150–400)
RBC: 3.08 MIL/uL — ABNORMAL LOW (ref 4.22–5.81)
RDW: 15.5 % (ref 11.5–15.5)
WBC: 27.5 10*3/uL — ABNORMAL HIGH (ref 4.0–10.5)
nRBC: 0.6 % — ABNORMAL HIGH (ref 0.0–0.2)

## 2020-11-24 LAB — GLUCOSE, CAPILLARY
Glucose-Capillary: 140 mg/dL — ABNORMAL HIGH (ref 70–99)
Glucose-Capillary: 160 mg/dL — ABNORMAL HIGH (ref 70–99)
Glucose-Capillary: 236 mg/dL — ABNORMAL HIGH (ref 70–99)
Glucose-Capillary: 252 mg/dL — ABNORMAL HIGH (ref 70–99)

## 2020-11-24 LAB — TRIGLYCERIDES
Triglycerides: 639 mg/dL — ABNORMAL HIGH (ref <150)
Triglycerides: 131 mg/dL (ref <150)

## 2020-11-24 LAB — HEPARIN INDUCED PLATELET AB (HIT ANTIBODY): Heparin Induced Plt Ab: 0.146 OD (ref 0.000–0.400)

## 2020-11-24 LAB — PREALBUMIN
Prealbumin: 8.2 mg/dL — ABNORMAL LOW (ref 18–38)
Prealbumin: 5.5 mg/dL — ABNORMAL LOW (ref 18–38)

## 2020-11-24 LAB — PHOSPHORUS: Phosphorus: 4.3 mg/dL (ref 2.5–4.6)

## 2020-11-24 LAB — CULTURE, RESPIRATORY W GRAM STAIN
Culture: NORMAL
Gram Stain: NONE SEEN

## 2020-11-24 LAB — MAGNESIUM: Magnesium: 2.6 mg/dL — ABNORMAL HIGH (ref 1.7–2.4)

## 2020-11-24 MED ORDER — ETOMIDATE 2 MG/ML IV SOLN
INTRAVENOUS | Status: AC
  Administered 2020-11-24: 07:00:00 20 mg
  Filled 2020-11-24: qty 20

## 2020-11-24 MED ORDER — NOREPINEPHRINE 4 MG/250ML-% IV SOLN
INTRAVENOUS | Status: AC
  Administered 2020-11-24: 08:00:00 5 ug/min via INTRAVENOUS
  Filled 2020-11-24: qty 250

## 2020-11-24 MED ORDER — FENTANYL CITRATE (PF) 100 MCG/2ML IJ SOLN
100.0000 ug | Freq: Once | INTRAMUSCULAR | Status: AC
Start: 2020-11-24 — End: 2020-11-24
  Administered 2020-11-24: 07:00:00 100 ug via INTRAVENOUS

## 2020-11-24 MED ORDER — DEXTROSE 10 % IV SOLN: INTRAVENOUS | Status: AC

## 2020-11-24 MED ORDER — NOREPINEPHRINE 4 MG/250ML-% IV SOLN
0.0000 ug/min | INTRAVENOUS | Status: DC
  Administered 2020-11-24 – 2020-11-25 (×2): 2 ug/min via INTRAVENOUS
  Filled 2020-11-24: qty 250

## 2020-11-24 MED ORDER — ROCURONIUM BROMIDE 50 MG/5ML IV SOLN: 50.0000 mg | Freq: Once | INTRAVENOUS | Status: DC

## 2020-11-24 MED ORDER — ETOMIDATE 2 MG/ML IV SOLN
15.0000 mg | Freq: Once | INTRAVENOUS | Status: AC
  Administered 2020-11-24: 07:00:00 15 mg via INTRAVENOUS

## 2020-11-24 MED ORDER — TRAVASOL 10 % IV SOLN
INTRAVENOUS | Status: DC
  Filled 2020-11-24: qty 1209.6

## 2020-11-24 MED ORDER — LACTATED RINGERS IV BOLUS
500.0000 mL | Freq: Once | INTRAVENOUS | Status: AC
  Administered 2020-11-24: 16:00:00 500 mL via INTRAVENOUS

## 2020-11-24 MED ORDER — ROCURONIUM BROMIDE 10 MG/ML (PF) SYRINGE
PREFILLED_SYRINGE | INTRAVENOUS | Status: AC
  Filled 2020-11-24: qty 10

## 2020-11-24 MED ORDER — MIDAZOLAM HCL 2 MG/2ML IJ SOLN
INTRAMUSCULAR | Status: AC
  Administered 2020-11-24: 07:00:00 2 mg
  Filled 2020-11-24: qty 4

## 2020-11-24 MED ORDER — FENTANYL CITRATE (PF) 100 MCG/2ML IJ SOLN
INTRAMUSCULAR | Status: AC
  Filled 2020-11-24: qty 2

## 2020-11-24 NOTE — Progress Notes (Signed)
Patient self extubated @0345 ; SpO2 100% on room air post extubation, Nasal Cannula 2L as precaution. RR 24, HR 101. CCM notified, RN at bedside.

## 2020-11-24 NOTE — Progress Notes (Signed)
PCCM Family Communication Note   A family meeting was held with several of the patient's family members, Dr. Bobbye Morton and Dr. Grandville Silos with [facility], as well as Dr. Ander Slade and myself. We discussed the patient's clinical course, critical illness, and possible care trajectories moving forward. All questions answered.  The family plans to discuss possible further interventions vs limitations of care amongst themselves tonight. We will continue current care while we await family decisions.  Eliseo Gum MSN, AGACNP-BC [place] [phone] If no answer, [phone] 11/24/2020, 4:40 PM

## 2020-11-24 NOTE — Progress Notes (Signed)
[place] Progress Note Patient Name: Ronald Wolf DOB: [birth year] MRN: [id number]   Date of Service  11/24/2020  HPI/Events of Note  Patient self extubated - Now on 2 L/min [place] O2 with sat = 100% and RR = 20.   eICU Interventions  Plan: 1. Observe extubated on [place] O2.  2. Decrease ceiling on Precedex IV infusion to 0.5 mcg/kg/hour.  3. Wean Precedex IV infusion off as tolerated.      Intervention Category Major Interventions: Respiratory failure - evaluation and management  [name] 11/24/2020, 3:58 AM

## 2020-11-24 NOTE — Progress Notes (Signed)
PT Cancellation Note  Patient Details Name: Ronald Wolf MRN: [id number] DOB: 19-May-1946   Cancelled Treatment:    Reason Eval/Treat Not Completed: Patient not medically ready Pt not medically ready, re-intubated this AM. Due to cancelling for the last few days will sign off as of now. Please re consult when pt medically ready and appropriate for PT services. Thanks,  Lacie Draft 11/24/2020, 8:49 AM Marisa Severin, PT, DPT Acute Rehabilitation Services Pager [phone] Office [phone]

## 2020-11-24 NOTE — Plan of Care (Signed)
PCCM Family Communication note   I met with the patient's brother, Selinda Flavin, at bedside with pt's sister, Janit Bern, on the phone, in conjunction with Dr. Bobbye Morton. We discussed clinical updates and the patient's hospital course.   The family would like to further discuss plans moving forward in a more formalized family meeting (aggressive vs palliative trajectories)-- will plan to have a conference call at 3:30 pm to discuss.    Eliseo Gum MSN, AGACNP-BC [facility] 11/24/2020, 9:52 AM

## 2020-11-24 NOTE — Progress Notes (Signed)
PHARMACY - TOTAL PARENTERAL NUTRITION CONSULT NOTE  Indication: Small bowel obstruction, prolonged ileus  Patient Measurements: Height: 5\' 11"  (180.3 cm) Weight: 61.2 kg (134 lb 14.7 oz) IBW/kg (Calculated) : 75.3 TPN AdjBW (KG): 60.8 Body mass index is 18.82 kg/m. Usual Weight: 137 lbs  Assessment:  74 YOM presented 11/20/2020 with nausea, vomiting, abdominal pain after leaving AMA at [facility] with known SBO. Patient has been on bowel rest with NG tube decompression. Patient continued to have persistent SBO on imaging with no evidence of improvement, now s/p ex-lap with LoA, SBR and placement of Gtube on 12/12/2020. Pharmacy consulted for TPN management.  Expected prolonged ileus.  Patient remains in ICU, extubated and then re-intubated x3, sedated on fentanyl PRN + Precedex.  Glucose / Insulin: no hx DM - CBGs 150-260s (no insulin during surgery), 11 units insulin used Electrolytes: TPN labs drawn through TPN line, redraw not complete before order deadline (K; goal >/= 4 for ileus) (Mag goal >/=2 with ileus) Renal: AKI improving (BL 0.9-1.1), UOP remains good LFTs / TGs: LFTs / TG WNL, Tbili up to 3 Prealbumin / albumin: BL prealbumin low 6.7, albumin 1.3 Intake / Output; MIVF: UOP 1.3 ml/kg/hr, NGT output 400>630mL/24hrs, net +11L since admit; LBM 12/12 GI Imaging: 12/11 DG Abd - stomach gas-filled, without distention, no obvious free air Surgeries / Procedures: 12/12 back to OR for re-exploration and washout with new copious drainage from incision concerning for enteric leak vs. Enterotomy >> now s/p, bowel necrosis resected  Central access: PICC placed 12/06/2020 TPN start date: 11/19/20   Nutritional Goals (per RD recommendations 12/8): 2100-2300 kCal, 120-135g protein, >2L fluid per day  Current Nutrition:  TPN; NPO (Goal Pivot 1.5 = 60 ml/hr when able to start)  Plan:  TPN labs drawn through TPN line, redraw not complete in time for order deadline, renal function appears stable at  this time and so will continue TPN as ordered 12/12 and adjust in AM as needed  Continue TPN at goal rate 90 ml/hr at 1800 TPN will provide 121g AA, 389g CHO, and 31g SMOF lipids (15% of total kCal due to [employer]), for a total of 2120 kCal, meeting 100% of needs. Electrolytes in TPN: Continue as previous, max acetate Add standard daily multivitamin and trace elements + folic acid 1mg  (on supplementation PTA) in TPN Continue sensitive SSI Q6H TPN labs  Bertis Ruddy, PharmD Clinical Pharmacist Please check AMION for all [place] numbers 11/24/2020 9:05 AM

## 2020-11-24 NOTE — Progress Notes (Signed)
NAME:  Ronald Wolf, MRN:  [id number], DOB:  February 06, 1946, LOS: 12 ADMISSION DATE:  12/05/2020, CONSULTATION DATE:  12/06/2020 REFERRING MD: Cyndia Skeeters CHIEF COMPLAINT:  hypotension   Brief History   Ronald Wolf is a 74 yo M w/ PMH of CVA, Seizures, Dementia, Bladder ca s/p urostomy, tobacco use, right glottic carcinoma s/p excision presenting with sbo. Found to have ischemic bowel requiring ex-lap. PCCM consulted due to post-op hypotension / respiratory distress  History of present illness   Ronald Wolf is a 74 yo M w/ above PMH who initially presented to [facility] on 11/11/20 with abdominal pain and nausea of 4-5 day duration. He was found to have SBO but left against medical advice as his family did not feel comfortable with him receiving his medical care in [place]. He came to [facility] ED the next day with similar complaint and was found to have SBO. He was treated with supportive care with bowel rest and ng suction. However his symptoms did not improve. Surgery team was consulted and he was brough to OR for exploratory laparoscopy. He was found to have multiple sections of ischemic bowel which were resected. He was unable to be extubated post-operatively and PCCM was consulted.  Past Medical History  CVA, Dementia, Bladder CA s/p nephrostomy, tobacco use, right glottic carcinoma s/p excision, seizures.  Significant Wolf Events   12/10/2020 Admission 12/05/2020 OR 12/04/2020 Admit to ICU 12/10 Brief SVT with associated hypertension. Self limited Given fentanyl, metop and hydral for HTN 12/11 Good urine output with Lasix , failed extubation, reintubated within 2 hours due to inability to clear secretions 12/12 return OR for enteric leak-- found to have bowel necrosis. Left in discontinuity  12/13 self extubated, reintubated.   Consults:  General Surgery PCCM Procedures:  12/06/2020 Ex-lap, G-tube placement ETT 12/7 >> 12/11, >>  Significant Diagnostic Tests:   CT abd/pelvis 12/2 >  High-grade distal small bowel obstruction, possibly due to adhesion. Mild ascites and diffuse mesenteric edema. No evidence of focal inflammatory process or abscess. Small bilateral pleural effusions and mild right lower lobe Atelectasis.    Micro Data:  11/26/2020 COVID/Flu negative 12/7 BA: Few GNR rare GPC -- predominantly PMN  12/7 BCx> NGTD  12/11 respiratory >>  Antimicrobials:  Fluc 12/8>> 12/10 Zosyn 12/8>>   Interim history/subjective:  Yesterday, went for return OR ex lap for enteric leak vs missed enterotomy. Found to have small bowel necrosis, including previous anastomosis, gross leakage of enteric contents. Necrosis resected. Left in discontinuity   Self-extubated overnight, re-intubated a few hours later  Hypotensive following intubation, on low dose pressors  AM labs are pending  Objective   Blood pressure (!) 178/76, pulse (!) 108, temperature 99.1 F (37.3 C), temperature source Axillary, resp. rate (!) 25, height 5\' 11"  (1.803 m), weight 62.7 kg, SpO2 100 %.    Vent Mode: PRVC FiO2 (%):  [40 %-100 %] 100 % Set Rate:  [15 bmp] 15 bmp Vt Set:  [450 mL] 450 mL PEEP:  [5 cmH20] 5 cmH20 Pressure Support:  [10 cmH20] 10 cmH20 Plateau Pressure:  [12 cmH20-20 cmH20] 17 cmH20   Intake/Output Summary (Last 24 hours) at 11/24/2020 0731 Last data filed at 11/24/2020 0600 Gross per 24 hour  Intake 2807.08 ml  Output 3570 ml  Net -762.92 ml   Filed Weights   11/19/20 0500 11/20/20 0500 11/21/20 0400  Weight: 60 kg 66.7 kg 62.7 kg    Examination: Gen:      Critically and chronically ill  appearing elderly, frail appearing M. Intubated sedated, on pressors, NAD  HEENT:  NCAT ETT secure anicteric sclera pink mm trachea midline. NGT secure  Lungs:    Mechanically ventilated. Symmetrical chest expansion, bilateral rhonchi  CV:         Tachycardic rate reg rhythm s1s2 no rgm  Abd:     Soft round, ndnt. Midline abdominal vac c/d/i with serosanguinous vac collection. G  tube with green output. OGT with green output.  GU: RUQ urostomy  Ext:   No obvious joint deformity. No cyanosis or clubbing. No edema  Skin:      C/d/w without rash  Neuro:  Sedated following intubation. Not following commands. Ronald [facility] Problem list   N/A  Assessment & Plan:   Acute encephalopathy  -multifactorial,  at risk for ICU delirium, encephalopathy due to sepsis, CNS depressing meds/anesthesia, AKI  P -Minimize sedation as able -Precedex, Fentanyl  -RASS goal 0 to -1   Acute hypoxic respiratory failure requiring re-intubation -On third intubation: 12/7, 12/11, 12/13 -Patient continued to require intubation post operatively-- bronch w mucus plug 12/7, unable to be weaned off of the vent. Failed extubation 12/11--> re-intubated. Self extubated 12/13 and failed --> re-intubated  P: -post intubation CXR, ABG 12/13 -wean vent support as able -PAD, VAP, pulm hygiene -Suspect pt likely will require tracheostomy if current course of care is to be continued-- overall condition is quite deconditioned and pt has failed extubation twice  SBO c/b ischemic bowel with perforation  -s/p ex-lap with anastomosis and G tube placement 12/7 -c/b enteric leak due to small bowel necrosis, requiring return OR for ex lap  12/12, resection of necrotic areas.  - Ex-lap 12/7 with findings of ischemic bowel, s/p resection and G-tube placement.  -Ex lap 12/12 with small bowel necrosis including prior anastamosis with gross enteric leakage. Left in discontinuity, temp closure  Post op ileus  P: -post op per [facility] -TPN via PICC  -Continue NGT to LIWS & monitor G tube output  Shock -following re-intubation 12/13 -- suspect medication induced -prior septic shock had improved, pt was off pressors 12/10 Severe sepsis, Peritonitis P -titrate pressors as needed for MAP > 65 -cont Zosyn x 7 ds until 12/14 -Leukocytosis trending down  Acute Kidney Injury, improving  -Cr  continuing to downtrend, UOP continuing to improve  P: -Monitor while on Lasix Hypokalemia  repleted  SVT, self limited, with associated hypertension   Hypertension P -2.5 metop q12hr -- hold while on pressors post intubation  Protein calorie malnutrition, moderate -TPN per pharmacy  Seizure Disorder -Has hx of epilepsy confirmed via EEG 12/24/19. On levetiracetam at home. P: -Cont Keppra  Hx of Bladder Cancer -Bladder cancer s/p urostomy placement P: -urostomy care  Anemia -component of Fe def P -defer IV iron  Goals of Care -poor baseline due to multiple medical problems, post emergent surgery with slow recovery, failed extubations (12/11, 12/13) due to general deconditioning and inability to clear secretions.  -Required return OR found to have further bowel necrosis requiring resection.  -Hopeful for another extubation trial once we optimize but may require tracheostomy  -There seems to be some variation in family understanding/wishes -- will try to discuss [place] formally, might be a situation with palliative care consult may be beneficial   Best practice (evaluated daily)  Diet: TPN  Pain/Anxiety/Delirium protocol (if indicated): Fentanyl as needed -Precedex PRN fent  VAP protocol (if indicated): Yes  DVT prophylaxis: SQH  GI prophylaxis: PPI  Glucose control: SSI  Mobility:BR  Family updated --goals of care 12/11 with sister, Clarene Critchley and daughter Derrick Ravel on conference call, full CODE STATUS, they would not want permanent tubes but desire re- intubation as needed  Code Status: Full Disposition: ICU  Labs   CBC: Recent Labs  Lab 11/30/2020 1748 11/19/20 0526 11/20/20 0732 11/21/20 0636 11/22/20 1526 11/18/2020 1010  WBC 9.3 31.4* 27.7* 19.2*  --  16.9*  NEUTROABS  --  26.4*  --   --   --   --   HGB 14.1 11.7* 10.0* 9.6* 7.1* 9.2*  HCT 42.6 34.8* 28.8* 28.2* 21.0* 28.6*  MCV 94.7 96.7 96.0 96.6  --  97.3  PLT 204 159 130* PLATELET CLUMPS NOTED ON SMEAR, COUNT  APPEARS DECREASED  --  91*    Basic Metabolic Panel: Recent Labs  Lab 11/19/20 0526 11/19/20 0732 11/20/20 0458 11/21/20 0500 11/22/20 0647 11/22/20 1526 11/18/2020 1010  NA 139  --  140 140 140 142 141  K 4.3  --  3.3* 3.3* 3.6 3.7 3.9  CL 109  --  108 108 107  --  105  CO2 15*  --  21* 22 20*  --  23  GLUCOSE 117*  --  176* 137* 155*  --  163*  BUN 21  --  34* 43* 54*  --  57*  CREATININE 2.08*  --  1.86* 1.70* 1.47*  --  1.38*  CALCIUM 7.3*  --  7.0* 7.0* 7.1*  --  7.6*  MG 1.7  --  2.1 2.5* 1.8  --  1.8  PHOS 6.3* 5.6* 2.8 2.5 3.1  --  3.2   GFR: Estimated Creatinine Clearance: 41.6 mL/min (A) (by C-G formula based on SCr of 1.38 mg/dL (H)). Recent Labs  Lab 11/24/2020 1748 12/02/2020 2109 11/19/20 0526 11/19/20 1037 11/19/20 1450 11/20/20 0732 11/21/20 0636 11/12/2020 1010  WBC 9.3  --  31.4*  --   --  27.7* 19.2* 16.9*  LATICACIDVEN 2.6* 3.1*  --  3.5* 2.9*  --   --   --     Liver Function Tests: Recent Labs  Lab 12/01/2020 0340 11/19/20 0526 11/20/20 0458 11/24/2020 1010  AST  --  24 26 31   ALT  --  11 12 13   ALKPHOS  --  27* 30* 42  BILITOT  --  2.7* 2.2* 3.0*  PROT  --  4.4* 3.6* 4.6*  ALBUMIN 2.5* 2.7* 1.7* 1.3*   No results for input(s): LIPASE, AMYLASE in the last 168 hours. No results for input(s): AMMONIA in the last 168 hours.  ABG    Component Value Date/Time   PHART 7.407 11/22/2020 1526   PCO2ART 39.0 11/22/2020 1526   PO2ART 233 (H) 11/22/2020 1526   HCO3 24.4 11/22/2020 1526   TCO2 26 11/22/2020 1526   ACIDBASEDEF 2.0 12/05/2020 1658   O2SAT 100.0 11/22/2020 1526     Coagulation Profile: No results for input(s): INR, PROTIME in the last 168 hours.  Cardiac Enzymes: No results for input(s): CKTOTAL, CKMB, CKMBINDEX, TROPONINI in the last 168 hours.  HbA1C: Hgb A1c MFr Bld  Date/Time Value Ref Range Status  11/19/2020 10:37 AM 5.0 4.8 - 5.6 % Final    Comment:    (NOTE) Pre diabetes:          5.7%-6.4%  Diabetes:               >6.4%  Glycemic control for   <7.0% adults with diabetes   12/17/2019 08:19  AM 4.5 (L) 4.8 - 5.6 % Final    Comment:    (NOTE) Pre diabetes:          5.7%-6.4% Diabetes:              >6.4% Glycemic control for   <7.0% adults with diabetes    CRITICAL CARE Performed by: Cristal Generous   Total critical care time: 41 minutes  Critical care time was exclusive of separately billable procedures and treating other patients. Critical care was necessary to treat or prevent imminent or life-threatening deterioration.  Critical care was time spent personally by me on the following activities: development of treatment plan with patient and/or surrogate as well as nursing, discussions with consultants, evaluation of patient's response to treatment, examination of patient, obtaining history from patient or surrogate, ordering and performing treatments and interventions, ordering and review of laboratory studies, ordering and review of radiographic studies, pulse oximetry and re-evaluation of patient's condition.  Eliseo Gum MSN, AGACNP-BC [place] [phone] If no answer, [phone] 11/24/2020, 7:32 AM

## 2020-11-24 NOTE — Progress Notes (Signed)
Patient ID: Ronald [facility], male   DOB: [birth year], 74 y.o.   MRN: [id number] Dr. Bobbye Morton and myself, along with Shirlee Limerick and Dr. Ander Slade from [place] had a long conference call with [name]'s family members regarding his status and goals of care. We discussed what further aggressive care may look like as well as the option for comfort care.We answered their questions and they will be speaking as a family tonight.  Georganna Skeans, MD, MPH, FACS Please use [email or web address] to contact on call provider

## 2020-11-24 NOTE — Progress Notes (Signed)
Trauma/Critical Care Follow Up Note  Subjective:    Overnight Issues:   Objective:  Vital signs for last 24 hours: Temp:  [97.5 F (36.4 C)-99.3 F (37.4 C)] 99 F (37.2 C) (12/13 1600) Pulse Rate:  [31-131] 31 (12/13 1600) Resp:  [17-29] 26 (12/13 1600) BP: (62-178)/(43-111) 145/69 (12/13 1600) SpO2:  [90 %-100 %] 100 % (12/13 1600) FiO2 (%):  [40 %-100 %] 50 % (12/13 1547)  Hemodynamic parameters for last 24 hours:    Intake/Output from previous day: 12/12 0701 - 12/13 0700 In: 2897.1 [I.V.:2474.9; IV Piggyback:422.2] Out: 0867 [Urine:1900; Emesis/NG output:650; Drains:1000; Blood:20]  Intake/Output this shift: Total I/O In: 1155.1 [I.V.:843.1; IV Piggyback:311.9] Out: 1550 [Urine:675; Emesis/NG output:400; Drains:475]  Vent settings for last 24 hours: Vent Mode: PRVC FiO2 (%):  [40 %-100 %] 50 % Set Rate:  [15 bmp] 15 bmp Vt Set:  [450 mL] 450 mL PEEP:  [5 cmH20] 5 cmH20 Plateau Pressure:  [12 YPP50-93 cmH20] 18 cmH20  Physical Exam:  Gen: comfortable, no distress Neuro: grossly non-focal, does not follow commands HEENT: intubated Neck: c-collar in place CV: RRR Pulm: unlabored breathing, mechanically ventilated Abd: soft, nontender GU: clear, yellow urine Extr: wwp, no edema   Results for orders placed or performed during the hospital encounter of 11/20/2020 (from the past 24 hour(s))  Glucose, capillary     Status: Abnormal   Collection Time: 11/28/2020  5:30 PM  Result Value Ref Range   Glucose-Capillary 264 (H) 70 - 99 mg/dL  Glucose, capillary     Status: Abnormal   Collection Time: 11/26/2020 11:52 PM  Result Value Ref Range   Glucose-Capillary 186 (H) 70 - 99 mg/dL  Triglycerides     Status: Abnormal   Collection Time: 11/24/20  5:52 AM  Result Value Ref Range   Triglycerides 639 (H) <150 mg/dL  Prealbumin     Status: Abnormal   Collection Time: 11/24/20  5:52 AM  Result Value Ref Range   Prealbumin 8.2 (L) 18 - 38 mg/dL  Glucose, capillary      Status: Abnormal   Collection Time: 11/24/20  6:33 AM  Result Value Ref Range   Glucose-Capillary 236 (H) 70 - 99 mg/dL  I-STAT 7, (LYTES, BLD GAS, ICA, H+H)     Status: Abnormal   Collection Time: 11/24/20  8:42 AM  Result Value Ref Range   pH, Arterial 7.313 (L) 7.350 - 7.450   pCO2 arterial 58.8 (H) 32.0 - 48.0 mmHg   pO2, Arterial 224 (H) 83.0 - 108.0 mmHg   Bicarbonate 29.9 (H) 20.0 - 28.0 mmol/L   TCO2 32 22 - 32 mmol/L   O2 Saturation 100.0 %   Acid-Base Excess 3.0 (H) 0.0 - 2.0 mmol/L   Sodium 142 135 - 145 mmol/L   Potassium 5.1 3.5 - 5.1 mmol/L   Calcium, Ion 1.02 (L) 1.15 - 1.40 mmol/L   HCT 23.0 (L) 39.0 - 52.0 %   Hemoglobin 7.8 (L) 13.0 - 17.0 g/dL   Patient temperature 97.8 F    Collection site Radial    Drawn by Operator    Sample type ARTERIAL   CBC with Differential/Platelet     Status: Abnormal   Collection Time: 11/24/20 11:54 AM  Result Value Ref Range   WBC 27.5 (H) 4.0 - 10.5 K/uL   RBC 3.08 (L) 4.22 - 5.81 MIL/uL   Hemoglobin 9.6 (L) 13.0 - 17.0 g/dL   HCT 32.1 (L) 39.0 - 52.0 %   MCV 104.2 (H) 80.0 -  100.0 fL   MCH 31.2 26.0 - 34.0 pg   MCHC 29.9 (L) 30.0 - 36.0 g/dL   RDW 15.5 11.5 - 15.5 %   Platelets 103 (L) 150 - 400 K/uL   nRBC 0.6 (H) 0.0 - 0.2 %   Neutrophils Relative % 67 %   Neutro Abs 22.3 (H) 1.7 - 7.7 K/uL   Band Neutrophils 14 %   Lymphocytes Relative 8 %   Lymphs Abs 2.2 0.7 - 4.0 K/uL   Monocytes Relative 6 %   Monocytes Absolute 1.7 (H) 0.1 - 1.0 K/uL   Eosinophils Relative 0 %   Eosinophils Absolute 0.0 0.0 - 0.5 K/uL   Basophils Relative 0 %   Basophils Absolute 0.0 0.0 - 0.1 K/uL   Myelocytes 5 %   Abs Immature Granulocytes 1.40 (H) 0.00 - 0.07 K/uL  Comprehensive metabolic panel     Status: Abnormal   Collection Time: 11/24/20 11:54 AM  Result Value Ref Range   Sodium 142 135 - 145 mmol/L   Potassium 5.6 (H) 3.5 - 5.1 mmol/L   Chloride 108 98 - 111 mmol/L   CO2 28 22 - 32 mmol/L   Glucose, Bld 280 (H) 70 - 99 mg/dL    BUN 64 (H) 8 - 23 mg/dL   Creatinine, Ser 1.45 (H) 0.61 - 1.24 mg/dL   Calcium 7.6 (L) 8.9 - 10.3 mg/dL   Total Protein 4.8 (L) 6.5 - 8.1 g/dL   Albumin 1.3 (L) 3.5 - 5.0 g/dL   AST 33 15 - 41 U/L   ALT 16 0 - 44 U/L   Alkaline Phosphatase 56 38 - 126 U/L   Total Bilirubin 1.7 (H) 0.3 - 1.2 mg/dL   GFR, Estimated 51 (L) >60 mL/min   Anion gap 6 5 - 15  Magnesium     Status: Abnormal   Collection Time: 11/24/20 11:54 AM  Result Value Ref Range   Magnesium 2.6 (H) 1.7 - 2.4 mg/dL  Phosphorus     Status: None   Collection Time: 11/24/20 11:54 AM  Result Value Ref Range   Phosphorus 4.3 2.5 - 4.6 mg/dL  Prealbumin     Status: Abnormal   Collection Time: 11/24/20 11:54 AM  Result Value Ref Range   Prealbumin 5.5 (L) 18 - 38 mg/dL  Triglycerides     Status: None   Collection Time: 11/24/20 11:54 AM  Result Value Ref Range   Triglycerides 131 <150 mg/dL  Glucose, capillary     Status: Abnormal   Collection Time: 11/24/20 12:03 PM  Result Value Ref Range   Glucose-Capillary 252 (H) 70 - 99 mg/dL    Assessment & Plan: The plan of care was discussed with the bedside nurse for the day, who is in agreement with this plan and no additional concerns were raised.   Present on Admission: . SBO (small bowel obstruction) ([place]) . Hypernatremia . AKI (acute kidney injury) ([place]) . Lactic acidosis . HTN (hypertension)    LOS: 12 days   Additional comments:I reviewed the patient's new clinical lab test results.   and I reviewed the patients new imaging test results.    SBO - POD6, s/p ex lap with SBR, placement of a gastrostomy tube 12/7 by Dr. Georgette Dover. Takeback 12/12 (POD1) by Dr. Zenia Resides for necrotic SB with resection of additional 60-70cm of SB inclusive of prior anastomosis in discontinuity with open abdomen.  - cont NPO, TPN, g-tube to gravity, abthera to suction - pending GoC discussion as below, plan  for takeback 12/14 vs 12/15 Malnourishment - continued clinical decline in a  patient who was previously quite deconditioned. Lengthy goals of care discussion with patient's siblings this AM inclusive of operative plan, post-operative expectations and possible complications, options to continue maximal medical therapies vs de-escalation fo care. Another family meeting scheduled for this afternoon with additional family members. Palliative care has been consulted.  VDRF - self-extubated this AM, re-intubated ~3h after. Second time failing extubation, expect he will need a tracheostomy given his complete clinical picture if family elects for maximal interventions Shock - likely septic, s/p 5d zosyn, necrotic bowel is most likely source. Continue vasopressors. Some element of renal dysfunction present, but still making urine, albeit not particularly functional urine. Continue to monitor, if hyperkalemia continues, may benefit from RRT. FEN -NPO/NGT/TPN. Okay for IV agents to treat hyperkalemia per primary team recs.  VTE -SQH Dispo - ICU  Critical Care Total Time: 45 minutes  Jesusita Oka, MD Trauma & General Surgery Please use [email or web address] to contact on call provider  11/24/2020  *Care during the described time interval was provided by me. I have reviewed this patient's available data, including medical history, events of note, physical examination and test results as part of my evaluation.

## 2020-11-24 NOTE — Procedures (Signed)
Intubation Procedure Note  NAHUM SHERRER  [id number]  1946/04/24  Date:11/24/20  Time:7:04 AM   Provider Performing:Jacque Byron W Heber Lacey    Procedure: Intubation (06770)  Indication(s) Respiratory Failure  Consent Unable to obtain consent due to emergent nature of procedure.   Anesthesia Etomidate, Versed and Fentanyl   Time Out Verified patient identification, verified procedure, site/side was marked, verified correct patient position, special equipment/implants available, medications/allergies/relevant history reviewed, required imaging and test results available.   Sterile Technique Usual hand hygeine, masks, and gloves were used   Procedure Description Patient positioned in bed supine.  Sedation given as noted above.  Patient was intubated with endotracheal tube using Glidescope.  View was Grade 1 full glottis .  Number of attempts was 1.  Colorimetric CO2 detector was consistent with tracheal placement.   Complications/Tolerance None; patient tolerated the procedure well. Chest X-ray is ordered to verify placement.   EBL Minimal   Specimen(s) None   Georgann Housekeeper, AGACNP-BC [place] for personal pager PCCM on call pager [phone]  11/24/2020 7:04 AM

## 2020-11-24 NOTE — Progress Notes (Signed)
Nutrition Follow-up  DOCUMENTATION CODES:   Severe malnutrition in context of chronic illness  INTERVENTION:   TPN meeting nutrition goals   NUTRITION DIAGNOSIS:   Severe Malnutrition related to chronic illness as evidenced by severe fat depletion,severe muscle depletion. Ongoing.   GOAL:   Patient will meet greater than or equal to 90% of their needs Met with TPN.   MONITOR:   I & O's  REASON FOR ASSESSMENT:   NPO/Clear Liquid Diet    ASSESSMENT:   74 year old male with history of cognitive impairment, CVA, HTN, bladder cancer now with urostomy, right-sided glottic carcinoma s/p excision brought to ED with nausea, vomiting and abdominal pain.  Work-up in ED revealed small bowel obstruction.  He also had AKI and lactic acidosis.  Started on bowel rest with NG tube decompression and IV fluid.  General surgery following.  Subsequent x-rays with persistent SBO.  Pt discussed during ICU rounds and with RN. TPN at goal rate.   12/7 s/p ex lap, extensive LOA, SBR, g-tube placement; s/p bronch  12/10 TPN advanced to goal rate - provides: 2120 kcal and 121 grams protein  12/12 back to OR for re-exploration and washout with new copious drainage from incision; pt s/p reopening of recent laparotomy, SBR without reanastomosis, abd washout, placement of VAC  Patient is currently intubated on ventilator support MV: 8.2 L/min Temp (24hrs), Avg:98.5 F (36.9 C), Min:97.5 F (36.4 C), Max:99.3 F (37.4 C)  Medications reviewed and include: SSI Precedex  Levophed @ 5 mcg Neosynephrine @ 25 mcg  Labs reviewed: TG: 639 CBG's: 186-236 Prealbumin 8.2 - decreased during acute inflammatory process - pt met criteria for severe malnutrition on admission  16 F NG tube:  650 ml  18 F G-tube: 200 ml  VAC: 800 ml   Diet Order:   Diet Order            Diet NPO time specified  Diet effective now                 EDUCATION NEEDS:   No education needs have been identified at this  time  Skin:  Skin Assessment: Reviewed RN Assessment  Last BM:  12/11 small/red  Height:   Ht Readings from Last 1 Encounters:  11/22/20 5' 11"  (1.803 m)    Weight:   Wt Readings from Last 1 Encounters:  11/21/20 62.7 kg    Ideal Body Weight:  78.2 kg  BMI:  Body mass index is 19.28 kg/m.  Estimated Nutritional Needs:   Kcal:  2100-2300  Protein:  120-135 grams  Fluid:  > 2 L  Adrea Sherpa P., RD, LDN, CNSC See AMiON for contact information

## 2020-11-25 ENCOUNTER — Inpatient Hospital Stay (HOSPITAL_COMMUNITY): Payer: Medicare PPO

## 2020-11-25 LAB — CBC
HCT: 28.8 % — ABNORMAL LOW (ref 39.0–52.0)
Hemoglobin: 9.2 g/dL — ABNORMAL LOW (ref 13.0–17.0)
MCH: 32.4 pg (ref 26.0–34.0)
MCHC: 31.9 g/dL (ref 30.0–36.0)
MCV: 101.4 fL — ABNORMAL HIGH (ref 80.0–100.0)
Platelets: 146 10*3/uL — ABNORMAL LOW (ref 150–400)
RBC: 2.84 MIL/uL — ABNORMAL LOW (ref 4.22–5.81)
RDW: 15.6 % — ABNORMAL HIGH (ref 11.5–15.5)
WBC: 34.6 10*3/uL — ABNORMAL HIGH (ref 4.0–10.5)
nRBC: 0.9 % — ABNORMAL HIGH (ref 0.0–0.2)

## 2020-11-25 LAB — GLUCOSE, CAPILLARY
Glucose-Capillary: 141 mg/dL — ABNORMAL HIGH (ref 70–99)
Glucose-Capillary: 138 mg/dL — ABNORMAL HIGH (ref 70–99)
Glucose-Capillary: 152 mg/dL — ABNORMAL HIGH (ref 70–99)

## 2020-11-25 LAB — BASIC METABOLIC PANEL
Anion gap: 12 (ref 5–15)
Anion gap: 8 (ref 5–15)
BUN: 60 mg/dL — ABNORMAL HIGH (ref 8–23)
BUN: 52 mg/dL — ABNORMAL HIGH (ref 8–23)
CO2: 23 mmol/L (ref 22–32)
CO2: 24 mmol/L (ref 22–32)
Calcium: 7.8 mg/dL — ABNORMAL LOW (ref 8.9–10.3)
Calcium: 7.9 mg/dL — ABNORMAL LOW (ref 8.9–10.3)
Chloride: 108 mmol/L (ref 98–111)
Chloride: 108 mmol/L (ref 98–111)
Creatinine, Ser: 1.39 mg/dL — ABNORMAL HIGH (ref 0.61–1.24)
Creatinine, Ser: 1.21 mg/dL (ref 0.61–1.24)
GFR, Estimated: 53 mL/min — ABNORMAL LOW (ref >60)
GFR, Estimated: >60 mL/min (ref >60)
Glucose, Bld: 154 mg/dL — ABNORMAL HIGH (ref 70–99)
Glucose, Bld: 167 mg/dL — ABNORMAL HIGH (ref 70–99)
Potassium: 4.8 mmol/L (ref 3.5–5.1)
Potassium: 4.7 mmol/L (ref 3.5–5.1)
Sodium: 143 mmol/L (ref 135–145)
Sodium: 140 mmol/L (ref 135–145)

## 2020-11-25 LAB — PATHOLOGIST SMEAR REVIEW

## 2020-11-25 LAB — MAGNESIUM
Magnesium: 2.3 mg/dL (ref 1.7–2.4)
Magnesium: 2.3 mg/dL (ref 1.7–2.4)

## 2020-11-25 LAB — SURGICAL PATHOLOGY

## 2020-11-25 MED ORDER — TRAVASOL 10 % IV SOLN
INTRAVENOUS | Status: AC
  Filled 2020-11-25: qty 1209.6

## 2020-11-25 MED ORDER — FENTANYL 2500MCG IN NS 250ML (10MCG/ML) PREMIX INFUSION
25.0000 ug/h | INTRAVENOUS | Status: DC
  Administered 2020-11-25: 15:00:00 25 ug/h via INTRAVENOUS
  Administered 2020-11-26: 18:00:00 75 ug/h via INTRAVENOUS
  Administered 2020-11-26: 22:00:00 100 ug/h via INTRAVENOUS
  Administered 2020-11-27: 18:00:00 150 ug/h via INTRAVENOUS
  Administered 2020-11-28: 12:00:00 100 ug/h via INTRAVENOUS
  Administered 2020-11-29: 13:00:00 50 ug/h via INTRAVENOUS
  Administered 2020-12-01: 20:00:00 25 ug/h via INTRAVENOUS
  Filled 2020-11-25 (×7): qty 250

## 2020-11-25 MED ORDER — POLYETHYLENE GLYCOL 3350 17 G PO PACK: 17.0000 g | PACK | Freq: Every day | ORAL | Status: DC

## 2020-11-25 MED ORDER — DILTIAZEM HCL-DEXTROSE 125-5 MG/125ML-% IV SOLN (PREMIX)
5.0000 mg/h | INTRAVENOUS | Status: DC
  Administered 2020-11-25: 22:00:00 5 mg/h via INTRAVENOUS
  Filled 2020-11-25 (×2): qty 125

## 2020-11-25 MED ORDER — DOCUSATE SODIUM 50 MG/5ML PO LIQD: 100.0000 mg | Freq: Two times a day (BID) | ORAL | Status: DC

## 2020-11-25 MED ORDER — FENTANYL CITRATE (PF) 100 MCG/2ML IJ SOLN: 25.0000 ug | Freq: Once | INTRAMUSCULAR | Status: DC

## 2020-11-25 MED ORDER — HEPARIN SODIUM (PORCINE) 5000 UNIT/ML IJ SOLN
5000.0000 [IU] | Freq: Three times a day (TID) | INTRAMUSCULAR | Status: DC
  Administered 2020-11-25 – 2020-12-05 (×30): 5000 [IU] via SUBCUTANEOUS
  Filled 2020-11-25 (×30): qty 1

## 2020-11-25 MED ORDER — FENTANYL BOLUS VIA INFUSION
25.0000 ug | INTRAVENOUS | Status: DC | PRN
  Filled 2020-11-25: qty 25

## 2020-11-25 NOTE — Progress Notes (Signed)
eLink Physician-Brief Progress Note Patient Name: Ronald Wolf DOB: 02-May-1946 MRN: [id number]   Date of Service  11/25/2020  HPI/Events of Note  AFIB with RVR - Ventricular rate = 125. BP = 200/96 with MAP = 127. Last K+ = 4.8 and Mg++ = 2.3 at 6:49 AM.  eICU Interventions  Plan: 1. Cardizem IV infusion. Titrate to HR = 65-105.  2. BMP and Mg++ level STAT. 3. 12 Lead EKG STAT.     Intervention Category Major Interventions: Arrhythmia - evaluation and management  Ronald Wolf 11/25/2020, 9:01 PM

## 2020-11-25 NOTE — Progress Notes (Addendum)
PHARMACY - TOTAL PARENTERAL NUTRITION CONSULT NOTE  Indication: Small bowel obstruction, prolonged ileus  Patient Measurements: Height: 5\' 11"  (180.3 cm) Weight: 61.2 kg (134 lb 14.7 oz) IBW/kg (Calculated) : 75.3 TPN AdjBW (KG): 60.8 Body mass index is 18.82 kg/m. Usual Weight: 137 lbs  Assessment:  74 YOM presented 11/23/2020 with nausea, vomiting, abdominal pain after leaving [facility] at [facility] with known SBO. Patient has been on bowel rest with NG tube decompression. Patient continued to have persistent SBO on imaging with no evidence of improvement, now s/p ex-lap with LoA, SBR and placement of Gtube on 11/23/2020. Pharmacy consulted for TPN management.  Expected prolonged ileus.  Patient remains in ICU, extubated and then re-intubated x3, sedated on fentanyl PRN + Precedex.  Glucose / Insulin: no hx DM - CBGs 140-150s, 11 units insulin used in past 24h, on D10 @90  overnight d/t lab issues from 12/13 Electrolytes: K 4.8 (K; goal >/= 4 for ileus) Mg 2.3 (Mag goal >/=2 with ileus), others wnl Renal: Fluctuating 1.45>1.39 (BL 0.9-1.1), BUN 64>60 LFTs / TGs: LFTs / TG WNL, Tbili down 3>1.7 Prealbumin / albumin: BL prealbumin low 6.7>5.5, albumin 1.3 Intake / Output; MIVF: UOP 1.3>1.1 ml/kg/hr, Gastrostomy 351ml, NGT output 400>657mL/24hrs, net +10.9L since admit; LBM 12/12 GI Imaging: 12/11 DG Abd - stomach gas-filled, without distention, no obvious free air Surgeries / Procedures: 12/12 back to OR for re-exploration and washout with new copious drainage from incision concerning for enteric leak vs. Enterotomy >> now s/p, bowel necrosis resected  Central access: PICC placed 11/19/2020 TPN start date: 11/19/20   Nutritional Goals (per RD recommendations 12/8): 2100-2300 kCal, 120-135g protein, >2L fluid per day  Current Nutrition:  TPN; NPO (Goal Pivot 1.5 = 60 ml/hr when able to start)  Plan:  Continue TPN at goal rate 90 ml/hr at 1800 TPN will provide 121g AA, 389g CHO, and 31g SMOF  lipids (15% of total kCal due to [employer]), for a total of 2120 kCal, meeting 100% of needs. Electrolytes in TPN: Decrease K, Mg from previous, max acetate Add standard daily multivitamin and trace elements + folic acid 1mg  (on supplementation PTA) in TPN Continue sensitive SSI Q6H D/c D10 gtt @1800   BMP, Mg with AM labs  Bertis Ruddy, PharmD Clinical Pharmacist Please check AMION for all [place] numbers 11/25/2020 8:03 AM

## 2020-11-25 NOTE — Progress Notes (Signed)
NAME:  Ronald Wolf, MRN:  [id number], DOB:  [birth year], LOS: 57 ADMISSION DATE:  11/13/2020, CONSULTATION DATE:  11/22/2020 REFERRING MD: Cyndia Skeeters CHIEF COMPLAINT:  hypotension   Brief History   Ronald Wolf is a 74 yo M w/ PMH of CVA, Seizures, Dementia, Bladder ca s/p urostomy, tobacco use, right glottic carcinoma s/p excision presenting with sbo. Found to have ischemic bowel requiring ex-lap. PCCM consulted due to post-op hypotension / respiratory distress  History of present illness   Ronald Wolf is a 74 yo M w/ above PMH who initially presented to [facility] hospital on 11/11/20 with abdominal pain and nausea of 4-5 day duration. He was found to have SBO but left against medical advice as his family did not feel comfortable with him receiving his medical care in [place]. He came to [place] ED the next day with similar complaint and was found to have SBO. He was treated with supportive care with bowel rest and ng suction. However his symptoms did not improve. Surgery team was consulted and he was brough to OR for exploratory laparoscopy. He was found to have multiple sections of ischemic bowel which were resected. He was unable to be extubated post-operatively and PCCM was consulted.  Past Medical History  CVA, Dementia, Bladder CA s/p nephrostomy, tobacco use, right glottic carcinoma s/p excision, seizures.  Significant Hospital Events   11/24/2020 Admission 11/26/2020 OR 12/12/2020 Admit to ICU 12/10 Brief SVT with associated hypertension. Self limited Given fentanyl, metop and hydral for HTN 12/11 Good urine output with Lasix , failed extubation, reintubated within 2 hours due to inability to clear secretions 12/12 return OR for enteric leak-- found to have bowel necrosis. Left in discontinuity  12/13 self extubated, reintubated. On pressors. Family meeting with PCCM and CCS to discuss possible next steps.   Consults:  General Surgery PCCM Procedures:  11/27/2020 Ex-lap, G-tube placement ETT  12/7 >> 12/11, >>  Significant Diagnostic Tests:   CT abd/pelvis 12/2 > High-grade distal small bowel obstruction, possibly due to adhesion. Mild ascites and diffuse mesenteric edema. No evidence of focal inflammatory process or abscess. Small bilateral pleural effusions and mild right lower lobe Atelectasis.    Micro Data:  12/09/2020 COVID/Flu negative 12/7 BA: Few GNR rare GPC -- predominantly PMN  12/7 BCx> NGTD  12/11 respiratory >>  Antimicrobials:  Fluc 12/8>> 12/10 Zosyn 12/8> 12/12  Interim history/subjective:  Family meeting yesterday with CCS, PCCM and several family members. Family has decided to proceed with all offered aggressive interventions.  Overnight, pt back on pressors, have since been weaned off More awake this morning. Pulled back NGT. RN has advanced and repeat KUB is pending to confirm placement  FiO2 on vent decr to 40%   AM BMP pending AM CBC with further increase in WBC from 27 to 34 Objective   Blood pressure 110/70, pulse 88, temperature 97.9 F (36.6 C), temperature source Axillary, resp. rate (!) 31, height 5\' 11"  (1.803 m), weight 64.3 kg, SpO2 100 %.    Vent Mode: PRVC FiO2 (%):  [40 %-60 %] 40 % Set Rate:  [15 bmp-16 bmp] 16 bmp Vt Set:  [450 mL] 450 mL PEEP:  [5 cmH20] 5 cmH20 Plateau Pressure:  [14 cmH20-18 cmH20] 17 cmH20   Intake/Output Summary (Last 24 hours) at 11/25/2020 0709 Last data filed at 11/25/2020 0600 Gross per 24 hour  Intake 2803.84 ml  Output 3250 ml  Net -446.16 ml   Filed Weights   11/20/20 0500 11/21/20 0400 11/25/20 0419  Weight: 66.7 kg 62.7 kg 64.3 kg    Examination: Gen:    Critically and chronically ill apeparing elderly, frail seeming M. Intubated, moving spontaneously, NAD  HEENT: ETT NGT secure. Anicteric sclera. Temporal muscle wasting.  Lungs:   Symmetrical chest expansion, rhonchi bilaterally. Tan pulm secretions  CV:       Tachycardic rate reg rhythm. s1s2 no rgm. Cap refill < 3 sec  Abd:     Midline abdominal incision with wound vac -- 250 ml vac collection overnight. G tube and NGT with green output.  GU: RUQ urostomy with yellow urine collection Ext:  No obvious joint deformity. No cyanosis or clubbing. Symmetrically decreased muscle mass BUE BLE Skin:      C/d/w without rash  Neuro:  Does not follow commands. Moving BUE spontaneously. Purposeful LUE movement, grabbing for ETT. Pupils are 49mm    Resolved Hospital Problem list   N/A  Assessment & Plan:   Acute encephalopathy  -multifactorial--  at risk for ICU delirium, encephalopathy due to sepsis, CNS depressing meds/anesthesia, AKI P -Minimize sedation as able -Precedex, Fentanyl  -RASS goal 0 to -1   Acute hypoxic respiratory failure requiring re-intubation -On third intubation: 12/7, 12/11, 12/13 -Patient continued to require intubation post operatively-- bronch w mucus plug 12/7, unable to be weaned off of the vent. Failed extubation 12/11--> re-intubated. Self extubated 12/13 and failed --> re-intubated  P: -wean vent support as able -PAD, VAP, pulm hygiene -family would want trach   SBO c/b ischemic bowel with perforation  -s/p ex-lap with anastomosis and G tube placement 12/7 - Ex-lap 12/7 with findings of ischemic bowel, s/p resection and G-tube placement.  -c/b enteric leak due to small bowel necrosis requiring return OR for ex lap  12/12, resection of necrotic areas.  -Ex lap 12/12 with small bowel necrosis including prior anastamosis with gross enteric leakage. Left in discontinuity, temp closure Post op ileus  P: -post op per CCS. Return OR 12/15 -TPN via PICC  -Continue NGT to LIWS & monitor G tube output  Shock -- medication related vs sepsis / multifactorial  -S/p course of zosyn. -post op 12/12 --WBC rising 12/13, 12/14 but with absence of fever. ? Reactive  -on and off pressors 12/13-12/14 P -titrate pressors as needed for MAP > 65 -close trend WBC fever curve. Defer restarting abx given  absence of fever.   Acute Kidney Injury, improving  -Cr continuing to downtrend, UOP continuing to improve  P: -Trend UOP, renal indices  SVT, self limited, with associated hypertension   Hypertension P -2.5 metop q12hr   Protein calorie malnutrition, moderate -TPN per pharmacy  Seizure Disorder -Has hx of epilepsy confirmed via EEG 12/24/19. On levetiracetam at home. P: -Cont Keppra   Hx of Bladder Cancer -Bladder cancer s/p urostomy placement P: -urostomy care  Anemia -component of Fe def P -defer IV iron  Goals of Care -poor baseline due to multiple medical problems, post emergent surgery with slow recovery, failed extubations (12/11, 12/13) due to general deconditioning and inability to clear secretions.  -12/13 Family discussion with CCS and PCCM. Family has decided to pursue all offered aggressive interventions (return OR for abd, trach, full code)  Best practice (evaluated daily)  Diet: TPN  Pain/Anxiety/Delirium protocol (if indicated): Fentanyl as needed -Precedex PRN fent  VAP protocol (if indicated): Yes  DVT prophylaxis: SQH  GI prophylaxis: PPI Glucose control: SSI  Mobility:BR  Family updated -- Joint family meeting with CCS ans PCCM (Dr. Bobbye Morton, Dr. Grandville Silos,  Dr. Fay Records) 12/13. Summary of conversation: Family is contemplating care trajectory moving forward (surgeries, trach, peg, etc).  12/14 Family has indicated desire for all offered aggressive interventions including return to OR for exlap, trach, long term TPN if indicated etc. Brother Craig at bedside Code Status: Full Disposition: ICU  Labs   CBC: Recent Labs  Lab 11/19/20 0526 11/20/20 0732 11/21/20 0636 11/22/20 1526 12/02/2020 1010 11/24/20 0842 11/24/20 1154  WBC 31.4* 27.7* 19.2*  --  16.9*  --  27.5*  NEUTROABS 26.4*  --   --   --   --   --  22.3*  HGB 11.7* 10.0* 9.6* 7.1* 9.2* 7.8* 9.6*  HCT 34.8* 28.8* 28.2* 21.0* 28.6* 23.0* 32.1*  MCV 96.7 96.0 96.6  --  97.3  --   104.2*  PLT 159 130* PLATELET CLUMPS NOTED ON SMEAR, COUNT APPEARS DECREASED  --  91*  --  103*    Basic Metabolic Panel: Recent Labs  Lab 11/20/20 0458 11/21/20 0500 11/22/20 0647 11/22/20 1526 11/15/2020 1010 11/24/20 0842 11/24/20 1154  NA 140 140 140 142 141 142 142  K 3.3* 3.3* 3.6 3.7 3.9 5.1 5.6*  CL 108 108 107  --  105  --  108  CO2 21* 22 20*  --  23  --  28  GLUCOSE 176* 137* 155*  --  163*  --  280*  BUN 34* 43* 54*  --  57*  --  64*  CREATININE 1.86* 1.70* 1.47*  --  1.38*  --  1.45*  CALCIUM 7.0* 7.0* 7.1*  --  7.6*  --  7.6*  MG 2.1 2.5* 1.8  --  1.8  --  2.6*  PHOS 2.8 2.5 3.1  --  3.2  --  4.3   GFR: Estimated Creatinine Clearance: 40.6 mL/min (A) (by C-G formula based on SCr of 1.45 mg/dL (H)). Recent Labs  Lab 11/21/2020 1748 11/20/2020 2109 11/19/20 0526 11/19/20 1037 11/19/20 1450 11/20/20 0732 11/21/20 0636 12/06/2020 1010 11/24/20 1154  WBC 9.3  --    < >  --   --  27.7* 19.2* 16.9* 27.5*  LATICACIDVEN 2.6* 3.1*  --  3.5* 2.9*  --   --   --   --    < > = values in this interval not displayed.    Liver Function Tests: Recent Labs  Lab 11/19/20 0526 11/20/20 0458 12/11/2020 1010 11/24/20 1154  AST 24 26 31  33  ALT 11 12 13 16   ALKPHOS 27* 30* 42 56  BILITOT 2.7* 2.2* 3.0* 1.7*  PROT 4.4* 3.6* 4.6* 4.8*  ALBUMIN 2.7* 1.7* 1.3* 1.3*   No results for input(s): LIPASE, AMYLASE in the last 168 hours. No results for input(s): AMMONIA in the last 168 hours.  ABG    Component Value Date/Time   PHART 7.313 (L) 11/24/2020 0842   PCO2ART 58.8 (H) 11/24/2020 0842   PO2ART 224 (H) 11/24/2020 0842   HCO3 29.9 (H) 11/24/2020 0842   TCO2 32 11/24/2020 0842   ACIDBASEDEF 2.0 12/02/2020 1658   O2SAT 100.0 11/24/2020 0842     Coagulation Profile: No results for input(s): INR, PROTIME in the last 168 hours.  Cardiac Enzymes: No results for input(s): CKTOTAL, CKMB, CKMBINDEX, TROPONINI in the last 168 hours.  HbA1C: Hgb A1c MFr Bld  Date/Time  Value Ref Range Status  11/19/2020 10:37 AM 5.0 4.8 - 5.6 % Final    Comment:    (NOTE) Pre diabetes:  5.7%-6.4%  Diabetes:              >6.4%  Glycemic control for   <7.0% adults with diabetes   12/17/2019 08:19 AM 4.5 (L) 4.8 - 5.6 % Final    Comment:    (NOTE) Pre diabetes:          5.7%-6.4% Diabetes:              >6.4% Glycemic control for   <7.0% adults with diabetes    CRITICAL CARE Performed by: Cristal Generous   Total critical care time: 43 minutes  Critical care time was exclusive of separately billable procedures and treating other patients. Critical care was necessary to treat or prevent imminent or life-threatening deterioration.  Critical care was time spent personally by me on the following activities: development of treatment plan with patient and/or surrogate as well as nursing, discussions with consultants, evaluation of patient's response to treatment, examination of patient, obtaining history from patient or surrogate, ordering and performing treatments and interventions, ordering and review of laboratory studies, ordering and review of radiographic studies, pulse oximetry and re-evaluation of patient's condition.  Eliseo Gum MSN, AGACNP-BC [place] [phone] If no answer, [phone] 11/25/2020, 7:09 AM

## 2020-11-25 NOTE — Progress Notes (Signed)
General Surgery Follow Up Note  Subjective:    Overnight Issues:   Objective:  Vital signs for last 24 hours: Temp:  [97.5 F (36.4 C)-99 F (37.2 C)] 97.9 F (36.6 C) (12/14 0400) Pulse Rate:  [25-136] 99 (12/14 0415) Resp:  [19-32] 32 (12/14 0415) BP: (62-209)/(43-133) 110/68 (12/14 0400) SpO2:  [95 %-100 %] 100 % (12/14 0415) FiO2 (%):  [40 %-100 %] 40 % (12/14 0415) Weight:  [64.3 kg] 64.3 kg (12/14 0419)  Hemodynamic parameters for last 24 hours:    Intake/Output from previous day: 12/13 0701 - 12/14 0700 In: 2707.8 [I.V.:1989.9; IV Piggyback:718] Out: 2480 [Urine:1455; Emesis/NG output:400; Drains:625]  Intake/Output this shift: Total I/O In: 966.4 [I.V.:858.4; IV Piggyback:108] Out: 730 [Urine:580; Drains:150]  Vent settings for last 24 hours: Vent Mode: PRVC FiO2 (%):  [40 %-100 %] 40 % Set Rate:  [15 bmp-16 bmp] 16 bmp Vt Set:  [450 mL] 450 mL PEEP:  [5 cmH20] 5 cmH20 Plateau Pressure:  [14 cmH20-18 cmH20] 17 cmH20  Physical Exam:  Gen: comfortable, no distress Neuro: grossly non-focal, follows commands HEENT: intubated Neck: supple CV: RRR Pulm: unlabored breathing, mechanically ventilated Abd: soft, abthera with good seal, urostomy productive, g-tube in place  GU: clear, yellow urine Extr: wwp, no edema   Results for orders placed or performed during the hospital encounter of 12/11/2020 (from the past 24 hour(s))  Glucose, capillary     Status: Abnormal   Collection Time: 11/24/20  6:33 AM  Result Value Ref Range   Glucose-Capillary 236 (H) 70 - 99 mg/dL  I-STAT 7, (LYTES, BLD GAS, ICA, H+H)     Status: Abnormal   Collection Time: 11/24/20  8:42 AM  Result Value Ref Range   pH, Arterial 7.313 (L) 7.350 - 7.450   pCO2 arterial 58.8 (H) 32.0 - 48.0 mmHg   pO2, Arterial 224 (H) 83.0 - 108.0 mmHg   Bicarbonate 29.9 (H) 20.0 - 28.0 mmol/L   TCO2 32 22 - 32 mmol/L   O2 Saturation 100.0 %   Acid-Base Excess 3.0 (H) 0.0 - 2.0 mmol/L   Sodium 142  135 - 145 mmol/L   Potassium 5.1 3.5 - 5.1 mmol/L   Calcium, Ion 1.02 (L) 1.15 - 1.40 mmol/L   HCT 23.0 (L) 39.0 - 52.0 %   Hemoglobin 7.8 (L) 13.0 - 17.0 g/dL   Patient temperature 97.8 F    Collection site Radial    Drawn by Operator    Sample type ARTERIAL   CBC with Differential/Platelet     Status: Abnormal   Collection Time: 11/24/20 11:54 AM  Result Value Ref Range   WBC 27.5 (H) 4.0 - 10.5 K/uL   RBC 3.08 (L) 4.22 - 5.81 MIL/uL   Hemoglobin 9.6 (L) 13.0 - 17.0 g/dL   HCT 32.1 (L) 39.0 - 52.0 %   MCV 104.2 (H) 80.0 - 100.0 fL   MCH 31.2 26.0 - 34.0 pg   MCHC 29.9 (L) 30.0 - 36.0 g/dL   RDW 15.5 11.5 - 15.5 %   Platelets 103 (L) 150 - 400 K/uL   nRBC 0.6 (H) 0.0 - 0.2 %   Neutrophils Relative % 67 %   Neutro Abs 22.3 (H) 1.7 - 7.7 K/uL   Band Neutrophils 14 %   Lymphocytes Relative 8 %   Lymphs Abs 2.2 0.7 - 4.0 K/uL   Monocytes Relative 6 %   Monocytes Absolute 1.7 (H) 0.1 - 1.0 K/uL   Eosinophils Relative 0 %  Eosinophils Absolute 0.0 0.0 - 0.5 K/uL   Basophils Relative 0 %   Basophils Absolute 0.0 0.0 - 0.1 K/uL   Myelocytes 5 %   Abs Immature Granulocytes 1.40 (H) 0.00 - 0.07 K/uL  Comprehensive metabolic panel     Status: Abnormal   Collection Time: 11/24/20 11:54 AM  Result Value Ref Range   Sodium 142 135 - 145 mmol/L   Potassium 5.6 (H) 3.5 - 5.1 mmol/L   Chloride 108 98 - 111 mmol/L   CO2 28 22 - 32 mmol/L   Glucose, Bld 280 (H) 70 - 99 mg/dL   BUN 64 (H) 8 - 23 mg/dL   Creatinine, Ser 1.45 (H) 0.61 - 1.24 mg/dL   Calcium 7.6 (L) 8.9 - 10.3 mg/dL   Total Protein 4.8 (L) 6.5 - 8.1 g/dL   Albumin 1.3 (L) 3.5 - 5.0 g/dL   AST 33 15 - 41 U/L   ALT 16 0 - 44 U/L   Alkaline Phosphatase 56 38 - 126 U/L   Total Bilirubin 1.7 (H) 0.3 - 1.2 mg/dL   GFR, Estimated 51 (L) >60 mL/min   Anion gap 6 5 - 15  Magnesium     Status: Abnormal   Collection Time: 11/24/20 11:54 AM  Result Value Ref Range   Magnesium 2.6 (H) 1.7 - 2.4 mg/dL  Phosphorus     Status:  None   Collection Time: 11/24/20 11:54 AM  Result Value Ref Range   Phosphorus 4.3 2.5 - 4.6 mg/dL  Prealbumin     Status: Abnormal   Collection Time: 11/24/20 11:54 AM  Result Value Ref Range   Prealbumin 5.5 (L) 18 - 38 mg/dL  Triglycerides     Status: None   Collection Time: 11/24/20 11:54 AM  Result Value Ref Range   Triglycerides 131 <150 mg/dL  Glucose, capillary     Status: Abnormal   Collection Time: 11/24/20 12:03 PM  Result Value Ref Range   Glucose-Capillary 252 (H) 70 - 99 mg/dL  Glucose, capillary     Status: Abnormal   Collection Time: 11/24/20  6:19 PM  Result Value Ref Range   Glucose-Capillary 140 (H) 70 - 99 mg/dL  Glucose, capillary     Status: Abnormal   Collection Time: 11/24/20 11:55 PM  Result Value Ref Range   Glucose-Capillary 160 (H) 70 - 99 mg/dL  Glucose, capillary     Status: Abnormal   Collection Time: 11/25/20  5:54 AM  Result Value Ref Range   Glucose-Capillary 141 (H) 70 - 99 mg/dL    Assessment & Plan:  Present on Admission: . SBO (small bowel obstruction) ([place]) . Hypernatremia . AKI (acute kidney injury) ([place]) . Lactic acidosis . HTN (hypertension)    LOS: 13 days   Additional comments:I reviewed the patient's new clinical lab test results.   and I reviewed the patients new imaging test results.     SBO - POD7, s/p ex lap with SBR, placement of a gastrostomy tube 12/7 by Dr. Georgette Dover. Takeback 12/12 (POD2) by Dr. Zenia Resides for necrotic SB with resection of additional 60-70cm of SB inclusive of prior anastomosis in discontinuity with open abdomen.  - cont NPO, TPN, g-tube to gravity, abthera to suction - pending GoC decision as below, plan for possible takeback 12/14 vs 12/15 Malnourishment - continued clinical decline in a patient who was previously quite deconditioned. Lengthy goals of care discussion with patient's siblings yesterday x2 inclusive of operative plan, post-operative expectations and possible complications, options to continue  maximal medical therapies vs partial or complete de-escalation of care. Palliative care has been consulted.  VDRF - self-extubated 12/13 AM, re-intubated ~3h after. Second time failing extubation, expect he will need a tracheostomy given his complete clinical picture if family elects for maximal interventions Shock - likely septic, s/p 5d zosyn, necrotic bowel was most likely source. Off pressors this AM. Some element of renal dysfunction yesterday, labs pending today. Still making urine. Continue to monitor, if hyperkalemia continues, may benefit from RRT. FEN-NPO/NGT/TPN. Okay for IV agents to treat hyperkalemia per primary team recs.  VTE-SQH Dispo - ICU, awaiting family decision regarding surgery  Jesusita Oka, MD Trauma & General Surgery Please use [email or web address] to contact on call provider  11/25/2020  *Care during the described time interval was provided by me. I have reviewed this patient's available data, including medical history, events of note, physical examination and test results as part of my evaluation.

## 2020-11-25 NOTE — Progress Notes (Signed)
Patient ID: Ronald Wolf, male   DOB: [birth year], 74 y.o.   MRN: [id number] I met with Ronald Wolf, Ronald Wolf at the bedside. The family has decided to continue aggressive care. They are still deciding what they want to do if we fing any more necrotic bowel on take back. We will plan to go back to the OR tomorrow AM for exploration, possible ileostomy and mucous fistula, possible closure. He is agreeable to this and the family will have further discussions.  Georganna Skeans, MD, MPH, FACS Please use [email or web address] to contact on call provider

## 2020-11-26 ENCOUNTER — Inpatient Hospital Stay (HOSPITAL_COMMUNITY): Payer: Medicare PPO | Admitting: Anesthesiology

## 2020-11-26 ENCOUNTER — Encounter (HOSPITAL_COMMUNITY): Payer: Self-pay | Admitting: Internal Medicine

## 2020-11-26 ENCOUNTER — Encounter (HOSPITAL_COMMUNITY): Admission: EM | Disposition: E | Payer: Self-pay | Source: Home / Self Care | Attending: Pulmonary Disease

## 2020-11-26 HISTORY — PX: ILEOSTOMY: SHX1783

## 2020-11-26 HISTORY — PX: LAPAROTOMY: SHX154

## 2020-11-26 LAB — BASIC METABOLIC PANEL
Anion gap: 8 (ref 5–15)
BUN: 51 mg/dL — ABNORMAL HIGH (ref 8–23)
CO2: 25 mmol/L (ref 22–32)
Calcium: 7.8 mg/dL — ABNORMAL LOW (ref 8.9–10.3)
Chloride: 108 mmol/L (ref 98–111)
Creatinine, Ser: 1.13 mg/dL (ref 0.61–1.24)
GFR, Estimated: >60 mL/min (ref >60)
Glucose, Bld: 334 mg/dL — ABNORMAL HIGH (ref 70–99)
Potassium: 4.8 mmol/L (ref 3.5–5.1)
Sodium: 141 mmol/L (ref 135–145)

## 2020-11-26 LAB — POCT I-STAT 7, (LYTES, BLD GAS, ICA,H+H)
Acid-Base Excess: 1 mmol/L (ref 0.0–2.0)
Acid-Base Excess: 2 mmol/L (ref 0.0–2.0)
Bicarbonate: 26.9 mmol/L (ref 20.0–28.0)
Bicarbonate: 28 mmol/L (ref 20.0–28.0)
Calcium, Ion: 1.23 mmol/L (ref 1.15–1.40)
Calcium, Ion: 1.24 mmol/L (ref 1.15–1.40)
HCT: 26 % — ABNORMAL LOW (ref 39.0–52.0)
HCT: 27 % — ABNORMAL LOW (ref 39.0–52.0)
Hemoglobin: 8.8 g/dL — ABNORMAL LOW (ref 13.0–17.0)
Hemoglobin: 9.2 g/dL — ABNORMAL LOW (ref 13.0–17.0)
O2 Saturation: 68 %
O2 Saturation: 72 %
Patient temperature: 99.1
Patient temperature: 99.1
Potassium: 4.7 mmol/L (ref 3.5–5.1)
Potassium: 4.7 mmol/L (ref 3.5–5.1)
Sodium: 142 mmol/L (ref 135–145)
Sodium: 143 mmol/L (ref 135–145)
TCO2: 28 mmol/L (ref 22–32)
TCO2: 30 mmol/L (ref 22–32)
pCO2 arterial: 47.6 mmHg (ref 32.0–48.0)
pCO2 arterial: 52.8 mmHg — ABNORMAL HIGH (ref 32.0–48.0)
pH, Arterial: 7.334 — ABNORMAL LOW (ref 7.350–7.450)
pH, Arterial: 7.361 (ref 7.350–7.450)
pO2, Arterial: 39 mmHg — CL (ref 83.0–108.0)
pO2, Arterial: 41 mmHg — ABNORMAL LOW (ref 83.0–108.0)

## 2020-11-26 LAB — CBC
HCT: 31 % — ABNORMAL LOW (ref 39.0–52.0)
Hemoglobin: 9.4 g/dL — ABNORMAL LOW (ref 13.0–17.0)
MCH: 31.3 pg (ref 26.0–34.0)
MCHC: 30.3 g/dL (ref 30.0–36.0)
MCV: 103.3 fL — ABNORMAL HIGH (ref 80.0–100.0)
Platelets: 188 10*3/uL (ref 150–400)
RBC: 3 MIL/uL — ABNORMAL LOW (ref 4.22–5.81)
RDW: 15.3 % (ref 11.5–15.5)
WBC: 27.3 10*3/uL — ABNORMAL HIGH (ref 4.0–10.5)
nRBC: 1.2 % — ABNORMAL HIGH (ref 0.0–0.2)

## 2020-11-26 LAB — GLUCOSE, CAPILLARY
Glucose-Capillary: 150 mg/dL — ABNORMAL HIGH (ref 70–99)
Glucose-Capillary: 157 mg/dL — ABNORMAL HIGH (ref 70–99)
Glucose-Capillary: 159 mg/dL — ABNORMAL HIGH (ref 70–99)
Glucose-Capillary: 177 mg/dL — ABNORMAL HIGH (ref 70–99)

## 2020-11-26 LAB — MAGNESIUM: Magnesium: 2.4 mg/dL (ref 1.7–2.4)

## 2020-11-26 SURGERY — LAPAROTOMY, EXPLORATORY
Anesthesia: General | Site: Abdomen

## 2020-11-26 MED ORDER — TRAVASOL 10 % IV SOLN
INTRAVENOUS | Status: AC
  Filled 2020-11-26: qty 1209.6

## 2020-11-26 MED ORDER — ROCURONIUM BROMIDE 10 MG/ML (PF) SYRINGE
PREFILLED_SYRINGE | INTRAVENOUS | Status: DC | PRN
  Administered 2020-11-26: 50 mg via INTRAVENOUS

## 2020-11-26 MED ORDER — SODIUM CHLORIDE 0.9 % IV SOLN
INTRAVENOUS | Status: DC | PRN
  Administered 2020-11-26: 10:00:00 2 g via INTRAVENOUS

## 2020-11-26 MED ORDER — LACTATED RINGERS IV SOLN: INTRAVENOUS | Status: DC | PRN

## 2020-11-26 MED ORDER — PHENYLEPHRINE HCL (PRESSORS) 10 MG/ML IV SOLN
INTRAVENOUS | Status: DC | PRN
  Administered 2020-11-26: 120 ug via INTRAVENOUS
  Administered 2020-11-26: 80 ug via INTRAVENOUS

## 2020-11-26 MED ORDER — SODIUM CHLORIDE 0.9 % IV SOLN
2.0000 g | INTRAVENOUS | Status: DC
  Filled 2020-11-26: qty 2

## 2020-11-26 MED ORDER — 0.9 % SODIUM CHLORIDE (POUR BTL) OPTIME
TOPICAL | Status: DC | PRN
  Administered 2020-11-26: 10:00:00 4000 mL

## 2020-11-26 MED ORDER — SODIUM CHLORIDE 0.9 % IV SOLN
2.0000 g | Freq: Once | INTRAVENOUS | Status: DC
  Filled 2020-11-26: qty 2

## 2020-11-26 MED ORDER — METOPROLOL TARTRATE 5 MG/5ML IV SOLN
2.5000 mg | Freq: Once | INTRAVENOUS | Status: AC
  Administered 2020-11-26: 08:00:00 2.5 mg via INTRAVENOUS
  Filled 2020-11-26: qty 5

## 2020-11-26 MED ORDER — METOPROLOL TARTRATE 5 MG/5ML IV SOLN
5.0000 mg | Freq: Two times a day (BID) | INTRAVENOUS | Status: DC
  Administered 2020-11-26 – 2020-11-27 (×2): 5 mg via INTRAVENOUS
  Filled 2020-11-26 (×2): qty 5

## 2020-11-26 SURGICAL SUPPLY — 47 items
APL PRP STRL LF DISP 70% ISPRP (MISCELLANEOUS) ×1
BAG URIMETER BARDEX IC 350 (UROLOGICAL SUPPLIES) ×2 IMPLANT
BLADE CLIPPER SURG (BLADE) IMPLANT
BNDG GAUZE ELAST 4 BULKY (GAUZE/BANDAGES/DRESSINGS) ×2 IMPLANT
CANISTER SUCT 3000ML PPV (MISCELLANEOUS) ×2 IMPLANT
CHLORAPREP W/TINT 26 (MISCELLANEOUS) ×2 IMPLANT
COVER SURGICAL LIGHT HANDLE (MISCELLANEOUS) ×2 IMPLANT
COVER WAND RF STERILE (DRAPES) ×2 IMPLANT
DRAPE LAPAROSCOPIC ABDOMINAL (DRAPES) ×2 IMPLANT
DRAPE WARM FLUID 44X44 (DRAPES) ×2 IMPLANT
DRSG OPSITE POSTOP 4X10 (GAUZE/BANDAGES/DRESSINGS) IMPLANT
DRSG OPSITE POSTOP 4X8 (GAUZE/BANDAGES/DRESSINGS) IMPLANT
ELECT BLADE 6.5 EXT (BLADE) IMPLANT
ELECT CAUTERY BLADE 6.4 (BLADE) ×2 IMPLANT
ELECT REM PT RETURN 9FT ADLT (ELECTROSURGICAL) ×2
ELECTRODE REM PT RTRN 9FT ADLT (ELECTROSURGICAL) ×1 IMPLANT
GLOVE BIO SURGEON STRL SZ8 (GLOVE) ×2 IMPLANT
GLOVE BIOGEL PI IND STRL 8 (GLOVE) ×1 IMPLANT
GLOVE BIOGEL PI INDICATOR 8 (GLOVE) ×1
GOWN STRL REUS W/ TWL LRG LVL3 (GOWN DISPOSABLE) ×1 IMPLANT
GOWN STRL REUS W/ TWL XL LVL3 (GOWN DISPOSABLE) ×1 IMPLANT
GOWN STRL REUS W/TWL LRG LVL3 (GOWN DISPOSABLE) ×2
GOWN STRL REUS W/TWL XL LVL3 (GOWN DISPOSABLE) ×2
HANDLE SUCTION POOLE (INSTRUMENTS) ×1 IMPLANT
KIT BASIN OR (CUSTOM PROCEDURE TRAY) ×2 IMPLANT
KIT OSTOMY DRAINABLE 2.75 STR (WOUND CARE) ×4 IMPLANT
KIT TURNOVER KIT B (KITS) ×2 IMPLANT
LIGASURE IMPACT 36 18CM CVD LR (INSTRUMENTS) IMPLANT
NS IRRIG 1000ML POUR BTL (IV SOLUTION) ×4 IMPLANT
PACK GENERAL/GYN (CUSTOM PROCEDURE TRAY) ×2 IMPLANT
PAD ABD 8X10 STRL (GAUZE/BANDAGES/DRESSINGS) ×4 IMPLANT
PAD ARMBOARD 7.5X6 YLW CONV (MISCELLANEOUS) ×2 IMPLANT
PENCIL SMOKE EVACUATOR (MISCELLANEOUS) ×2 IMPLANT
SPECIMEN JAR LARGE (MISCELLANEOUS) IMPLANT
SPONGE LAP 18X18 RF (DISPOSABLE) IMPLANT
STAPLER VISISTAT 35W (STAPLE) ×2 IMPLANT
SUCTION POOLE HANDLE (INSTRUMENTS) ×2
SUT NOVA 1 T20/GS 25DT (SUTURE) ×8 IMPLANT
SUT PDS AB 1 TP1 96 (SUTURE) ×4 IMPLANT
SUT SILK 2 0 SH CR/8 (SUTURE) ×2 IMPLANT
SUT SILK 2 0 TIES 10X30 (SUTURE) ×2 IMPLANT
SUT SILK 3 0 SH CR/8 (SUTURE) ×2 IMPLANT
SUT SILK 3 0 TIES 10X30 (SUTURE) ×2 IMPLANT
SUT VIC AB 3-0 SH 18 (SUTURE) ×4 IMPLANT
TOWEL GREEN STERILE (TOWEL DISPOSABLE) ×2 IMPLANT
TRAY FOLEY MTR SLVR 16FR STAT (SET/KITS/TRAYS/PACK) IMPLANT
YANKAUER SUCT BULB TIP NO VENT (SUCTIONS) IMPLANT

## 2020-11-26 NOTE — Progress Notes (Signed)
Patient desaturating to high 80s. Contacted RT, RT Kim at bedside with patient. RT changed vent settings, patient now at 100%. This RN will continue to monitor

## 2020-11-26 NOTE — Progress Notes (Signed)
NAME:  Ronald Wolf, MRN:  [id number], DOB:  1946/01/12, LOS: 64 ADMISSION DATE:  12/05/2020, CONSULTATION DATE:  11/29/2020 REFERRING MD: Cyndia Skeeters CHIEF COMPLAINT:  hypotension   Brief History   Mr.Donath is a 74 yo M w/ PMH of CVA, Seizures, Dementia, Bladder ca s/p urostomy, tobacco use, right glottic carcinoma s/p excision presenting with sbo. Found to have ischemic bowel requiring ex-lap. PCCM consulted due to post-op hypotension / respiratory distress  History of present illness   Mr.Mowatt is a 74 yo M w/ above PMH who initially presented to [facility] hospital on 11/11/20 with abdominal pain and nausea of 4-5 day duration. He was found to have SBO but left against medical advice as his family did not feel comfortable with him receiving his medical care in [place]. He came to [facility] ED the next day with similar complaint and was found to have SBO. He was treated with supportive care with bowel rest and ng suction. However his symptoms did not improve. Surgery team was consulted and he was brough to OR for exploratory laparoscopy. He was found to have multiple sections of ischemic bowel which were resected. He was unable to be extubated post-operatively and PCCM was consulted.  Past Medical History  CVA, Dementia, Bladder CA s/p nephrostomy, tobacco use, right glottic carcinoma s/p excision, seizures.  Significant Hospital Events   12/02/2020 Admission 12/11/2020 OR 11/29/2020 Admit to ICU 12/10 Brief SVT with associated hypertension. Self limited Given fentanyl, metop and hydral for HTN 12/11 Good urine output with Lasix , failed extubation, reintubated within 2 hours due to inability to clear secretions 12/12 return OR for enteric leak-- found to have bowel necrosis. Left in discontinuity  12/13 self extubated, reintubated. On pressors. Family meeting with PCCM and CCS to discuss possible next steps.   Consults:  General Surgery PCCM Procedures:  11/22/2020 Ex-lap, G-tube placement ETT  12/7 >> 12/11, >>  Significant Diagnostic Tests:   CT abd/pelvis 12/2 > High-grade distal small bowel obstruction, possibly due to adhesion. Mild ascites and diffuse mesenteric edema. No evidence of focal inflammatory process or abscess. Small bilateral pleural effusions and mild right lower lobe Atelectasis.    Micro Data:  11/14/2020 COVID/Flu negative 12/7 BA: Few GNR rare GPC -- predominantly PMN  12/7 BCx> NGTD  12/11 respiratory >>  Antimicrobials:  Fluc 12/8>> 12/10 Zosyn 12/8> 12/12  Interim history/subjective:  Last night family (daughter and sister) called and reportedly are possibly unsure about OR today, consent not obtained  Started on dilt gtt last night for Afib RVR, also required concomitant pressors  Trial of dc dilt this morning with Hr < 90.   Fiance Vanessa at bedside. 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 rate (!) 23, height 5\' 11"  (1.803 m), weight 64.8 kg, SpO2 100 %.    Vent Mode: PRVC FiO2 (%):  [40 %] 40 % Set Rate:  [15 bmp] 15 bmp Vt Set:  [450 mL] 450 mL PEEP:  [5 cmH20] 5 cmH20   Intake/Output Summary (Last 24 hours) at 11/21/2020 0716 Last data filed at 11/22/2020 0600 Gross per 24 hour  Intake 2860 ml  Output 2925 ml  Net -65 ml   Filed Weights   11/25/20 0419 11/25/20 1014 12/05/2020 0500  Weight: 64.3 kg 64 kg 64.8 kg    Examination: Gen: Critically and chronically ill appearing elderly, frail M. Intubated, moving spontaneously. Appears uncomfortable  HEENT:  Temporal muscle wasting. ETT and G tube secure. Anicteric sclera. Trachea midline  Lungs:   Symmetrical chest expansion, Mechanically ventilated. Bilat rhonchi. Tan pulm secretions  CV:  Reg rate irreg rhythm. s1s2 no rgm cap refill < 3 sec Abd:   Midline incision with wound vac in place Soft abd.  GU:  RUQ urostomy, Yellow urine normal ext genital anatomy  Ext: Symmetrically  decreased muscle mass BUE BLE. No obvious joint deformity. PICC  Skin: c/d/w without rash  Neuro:  Awake, alert. Does not follow commands. Moving BUE BLE spontaneously    Resolved Hospital Problem list   N/A  Assessment & Plan:   Acute encephalopathy  -multifactorial--  at risk for ICU delirium, encephalopathy due to sepsis, CNS depressing meds/anesthesia, AKI P -Minimize sedation -Precedex, Fentanyl  -RASS goal 0 to -1   Acute hypoxic respiratory failure requiring re-intubation -On third intubation: 12/7, 12/11, 12/13 -Patient continued to require intubation post operatively-- bronch w mucus plug 12/7, unable to be weaned off of the vent. Failed extubation 12/11--> re-intubated. Self extubated 12/13 and failed --> re-intubated  P: -wean vent support as able -PAD, VAP, pulm hygiene -family would want trach -- pursue this pending OR findings 12/15   SBO c/b ischemic bowel with perforation  -s/p ex-lap with anastomosis and G tube placement 12/7 - Ex-lap 12/7 with findings of ischemic bowel, s/p resection and G-tube placement.  -c/b enteric leak due to small bowel necrosis requiring return OR for ex lap  12/12, resection of necrotic areas.  -Ex lap 12/12 with small bowel necrosis including prior anastamosis with gross enteric leakage. Left in discontinuity, temp closure Post op ileus   P: -Return OR 12/15 with CCS pending consent from family. D/w Dr. Grandville Silos who is following up with family to clarify plans -TPN via PICC  -Continue NGT to [facility] & monitor G tube output  Shock -- medication related vs sepsis / multifactorial   -S/p course of zosyn. -post op 12/12 --WBC rising 12/13, 12/14 but with absence of fever. ? Reactive  -on and off pressors 12/13-12/14 P -close trend WBC fever curve. Defer restarting abx given absence of fever.  -is on and off pressors, not off consistently.   Acute Kidney Injury, improving  P: -Trend UOP, renal indices  SVT, self limited, with  associated hypertension  Afib RVR, improved  Hypertension P -Increasing metop from 2.5mg  q12  to 5mg  q12  -Dc dilt gtt -- can restart if needed for Afib RVR -K goal 4 Mag gaol 2  -no systemic anticoag with pending abd surgery   Protein calorie malnutrition, moderate -TPN per pharmacy  Seizure Disorder -Has hx of epilepsy confirmed via EEG 12/24/19. On levetiracetam at home. P: -Cont Keppra   Hx of Bladder Cancer -Bladder cancer s/p urostomy placement P: -urostomy care  Anemia -component of Fe def P -defer IV iron -trend CBC, check post op   Goals of Care -poor baseline due to multiple medical problems, post emergent surgery with slow recovery, failed extubations (12/11, 12/13) due to general deconditioning and inability to clear secretions.  -12/13 Family discussion with CCS and PCCM. Family has decided to pursue all offered aggressive interventions (return OR for abd, trach, full code)  Best practice (evaluated daily)  Diet: TPN  Pain/Anxiety/Delirium protocol (if indicated): Fent, precedex VAP protocol (if indicated): Yes  DVT prophylaxis: SQH  GI prophylaxis: PPI Glucose control: SSI  Mobility:BR  Family updated -- Joint family meeting with CCS ans PCCM (Dr. Bobbye Morton, Dr. Grandville Silos,  Dr. Fay Records) 12/13. Summary of conversation: Family is contemplating care trajectory moving forward (surgeries, trach, peg, etc).  12/14 Family has indicated desire for all offered aggressive interventions including return to OR for exlap, trach, long term TPN if indicated etc. Brother [name] at bedside 12/15-- consent for OR not given overnight, questions from daughter and sister. [name] at bedside 12/15 AM.  Code Status: Full Disposition: ICU  Labs   CBC: Recent Labs  Lab 11/20/20 0732 11/21/20 0636 11/22/20 1526 12/08/2020 1010 11/24/20 0842 11/24/20 1154 11/25/20 0649  WBC 27.7* 19.2*  --  16.9*  --  27.5* 34.6*  NEUTROABS  --   --   --   --   --  22.3*  --   HGB  10.0* 9.6* 7.1* 9.2* 7.8* 9.6* 9.2*  HCT 28.8* 28.2* 21.0* 28.6* 23.0* 32.1* 28.8*  MCV 96.0 96.6  --  97.3  --  104.2* 101.4*  PLT 130* PLATELET CLUMPS NOTED ON SMEAR, COUNT APPEARS DECREASED  --  91*  --  103* 146*    Basic Metabolic Panel: Recent Labs  Lab 11/20/20 0458 11/21/20 0500 11/22/20 0647 11/22/20 1526 12/02/2020 1010 11/24/20 0842 11/24/20 1154 11/25/20 0649 11/25/20 2105  NA 140 140 140   < > 141 142 142 143 140  K 3.3* 3.3* 3.6   < > 3.9 5.1 5.6* 4.8 4.7  CL 108 108 107  --  105  --  108 108 108  CO2 21* 22 20*  --  23  --  28 23 24   GLUCOSE 176* 137* 155*  --  163*  --  280* 154* 167*  BUN 34* 43* 54*  --  57*  --  64* 60* 52*  CREATININE 1.86* 1.70* 1.47*  --  1.38*  --  1.45* 1.39* 1.21  CALCIUM 7.0* 7.0* 7.1*  --  7.6*  --  7.6* 7.8* 7.9*  MG 2.1 2.5* 1.8  --  1.8  --  2.6* 2.3 2.3  PHOS 2.8 2.5 3.1  --  3.2  --  4.3  --   --    < > = values in this interval not displayed.   GFR: Estimated Creatinine Clearance: 49.1 mL/min (by C-G formula based on SCr of 1.21 mg/dL). Recent Labs  Lab 11/19/20 1037 11/19/20 1450 11/20/20 0732 11/21/20 0636 12/06/2020 1010 11/24/20 1154 11/25/20 0649  WBC  --   --    < > 19.2* 16.9* 27.5* 34.6*  LATICACIDVEN 3.5* 2.9*  --   --   --   --   --    < > = values in this interval not displayed.    Liver Function Tests: Recent Labs  Lab 11/20/20 0458 12/12/2020 1010 11/24/20 1154  AST 26 31 33  ALT 12 13 16   ALKPHOS 30* 42 56  BILITOT 2.2* 3.0* 1.7*  PROT 3.6* 4.6* 4.8*  ALBUMIN 1.7* 1.3* 1.3*   No results for input(s): LIPASE, AMYLASE in the last 168 hours. No results for input(s): AMMONIA in the last 168 hours.  ABG    Component Value Date/Time   PHART 7.313 (L) 11/24/2020 0842   PCO2ART 58.8 (H) 11/24/2020 0842   PO2ART 224 (H) 11/24/2020 0842   HCO3 29.9 (H) 11/24/2020 0842   TCO2 32 11/24/2020 0842   ACIDBASEDEF 2.0 12/11/2020 1658   O2SAT 100.0 11/24/2020 0842     Coagulation Profile: No results for  input(s): INR, PROTIME in the last 168 hours.  Cardiac Enzymes: No results for input(s): CKTOTAL,  CKMB, CKMBINDEX, TROPONINI in the last 168 hours.  HbA1C: Hgb A1c MFr Bld  Date/Time Value Ref Range Status  11/19/2020 10:37 AM 5.0 4.8 - 5.6 % Final    Comment:    (NOTE) Pre diabetes:          5.7%-6.4%  Diabetes:              >6.4%  Glycemic control for   <7.0% adults with diabetes   12/17/2019 08:19 AM 4.5 (L) 4.8 - 5.6 % Final    Comment:    (NOTE) Pre diabetes:          5.7%-6.4% Diabetes:              >6.4% Glycemic control for   <7.0% adults with diabetes    CRITICAL CARE Performed by: Cristal Generous   Total critical care time: 38 minutes  Critical care time was exclusive of separately billable procedures and treating other patients. Critical care was necessary to treat or prevent imminent or life-threatening deterioration.  Critical care was time spent personally by me on the following activities: development of treatment plan with patient and/or surrogate as well as nursing, discussions with consultants, evaluation of patient's response to treatment, examination of patient, obtaining history from patient or surrogate, ordering and performing treatments and interventions, ordering and review of laboratory studies, ordering and review of radiographic studies, pulse oximetry and re-evaluation of patient's condition.  Eliseo Gum MSN, AGACNP-BC [place] [phone] If no answer, [phone] 11/20/2020, 7:16 AM

## 2020-11-26 NOTE — Progress Notes (Signed)
3 Days Post-Op   Subjective/Chief Complaint: On vent AF RVR overnight but has slowed   Objective: Vital signs in last 24 hours: Temp:  [97.7 F (36.5 C)-99.1 F (37.3 C)] 99.1 F (37.3 C) (12/15 0400) Pulse Rate:  [52-160] 82 (12/15 0715) Resp:  [17-35] 22 (12/15 0715) BP: (84-200)/(45-105) 121/66 (12/15 0715) SpO2:  [89 %-100 %] 100 % (12/15 0715) FiO2 (%):  [40 %] 40 % (12/15 0438) Weight:  [64 kg-64.8 kg] 64.8 kg (12/15 0500) Last BM Date: 11/22/20  Intake/Output from previous day: 12/14 0701 - 12/15 0700 In: 2860 [I.V.:2616.9; IV Piggyback:243.1] Out: 2925 [Urine:1650; Emesis/NG output:525; Drains:750] Intake/Output this shift: No intake/output data recorded.  General appearance: no distress Resp: rhonchi R>L Cardio: irregularly irregular rhythm GI: open abd VAC Extremities: calves soft Neurologic: Mental status: sedated  Lab Results:  Recent Labs    11/24/20 1154 11/25/20 0649  WBC 27.5* 34.6*  HGB 9.6* 9.2*  HCT 32.1* 28.8*  PLT 103* 146*   BMET Recent Labs    11/25/20 0649 11/25/20 2105  NA 143 140  K 4.8 4.7  CL 108 108  CO2 23 24  GLUCOSE 154* 167*  BUN 60* 52*  CREATININE 1.39* 1.21  CALCIUM 7.8* 7.9*   PT/INR No results for input(s): LABPROT, INR in the last 72 hours. ABG Recent Labs    11/24/20 0842  PHART 7.313*  HCO3 29.9*    Studies/Results: DG Abd Portable 1V  Result Date: 11/25/2020 CLINICAL DATA:  NG placement. EXAM: PORTABLE ABDOMEN - 1 VIEW COMPARISON:  12/11/2020 FINDINGS: NG tube tip in the body the stomach. Gastrostomy tube also overlying the region of the body of the stomach. Normal bowel gas pattern.  No dilated bowel loops. IMPRESSION: NG tip in the body the stomach. Gastrostomy tube overlying the body of the stomach. Electronically Signed   By: Franchot Gallo M.D.   On: 11/25/2020 08:03    Anti-infectives: Anti-infectives (From admission, onward)   Start     Dose/Rate Route Frequency Ordered Stop   11/19/20 1130   piperacillin-tazobactam (ZOSYN) IVPB 3.375 g        3.375 g 12.5 mL/hr over 240 Minutes Intravenous Every 8 hours 11/19/20 1017 12/09/2020 2359   11/19/20 1115  fluconazole (DIFLUCAN) IVPB 200 mg        200 mg 100 mL/hr over 60 Minutes Intravenous Every 24 hours 11/19/20 1017 11/21/20 1104   11/19/20 0915  Ampicillin-Sulbactam (UNASYN) 3 g in sodium chloride 0.9 % 100 mL IVPB  Status:  Discontinued        3 g 200 mL/hr over 30 Minutes Intravenous Every 12 hours 11/19/20 0826 11/19/20 0949      Assessment/Plan: SBO - s/p ex lap with SBR, placement of a gastrostomy tube 12/7 by Dr. Georgette Dover. Takeback 12/12 by Dr. Zenia Resides for necrotic SB with resection of additional 60-70cm of SB inclusive of prior anastomosis in discontinuity with open abdomen.  - cont NPO, TPN, g-tube to gravity, abthera to suction - return to OR today as below Malnourishment - continued clinical decline in a patient who was previously quite deconditioned. Lengthy goals of care discussion with family are ongoing VDRF - self-extubated 12/13 AM, re-intubated ~3h after. Second time failing extubation, expect he will need a tracheostomy given his complete clinical picture if family elects for maximal interventions Shock/ID - likely septic, completed 5d zosyn, necrotic bowel was most likely source. On pressors overnight but back off. Cefotetam on call to OR CV/AF RVR - did not tolerate dilt,  rate better after lopressor this AM FEN-NPO/NGT/TPN.  VTE-SQH Dispo - ICU, I spoke with his brother [name] on the phone. The family has decided to proceed with aggressive care. Plan return to OR this AM for ex lap, ileostomy, mucous fistula, possible closure. If there is dead bowel will treat aggressively. I again discussed the procedure, risks, and benefits. Phone consent done. I also spoke with his fiancee at the bedside.   LOS: 14 days    Ronald Wolf 11/22/2020

## 2020-11-26 NOTE — Op Note (Signed)
  11/29/2020  10:36 AM  PATIENT:  Ronald Wolf  74 y.o. male  PRE-OPERATIVE DIAGNOSIS:  open abdomen s/p SBR  POST-OPERATIVE DIAGNOSIS:  open abdomen s/p SBR, no further bowel ischemia  PROCEDURE:  Procedure(s): EXPLORATORY LAPAROTOMY ILEOSTOMY WITH MUCOUS FISTULA CREATION ABDOMINAL CLOSURE  SURGEON:  Surgeon(s): Georganna Skeans, MD  ASSISTANTS: Margie Billet, PA-C   ANESTHESIA:   general  EBL:  Total I/O In: 199 [I.V.:99; IV Piggyback:100] Out: 400 [Urine:400]  BLOOD ADMINISTERED:none  DRAINS: none   SPECIMEN:  No Specimen  DISPOSITION OF SPECIMEN:  N/A  COUNTS:  YES  DICTATION: .Dragon Dictation Findings: Remaining small bowel proximally and distally was viable.  No other bowel ischemia noted, fascia with some duskiness and scattered necrosis along the edge  Procedure in detail: Informed consent was obtained.  He received intravenous antibiotics.  He was brought directly from the intensive care unit to the operating room on the ventilator.  General anesthesia was administered by the anesthesia staff.  The outer portion of his VAC including the drape and blue sponges was removed.  A Foley catheter was placed in his urostomy.  His abdomen was prepped and draped in a sterile fashion.  We did a timeout procedure.  I gently irrigated the inner VAC drape and removed it carefully.  The abdomen was explored there was no succus.  There was some serosanguineous fluid scattered about.  Adhesions were gently freed up and identified the distal portion of his small bowel followed by the proximal portion of the small bowel.  Both appeared viable.  I ran the proximal part back to the ligament of Treitz and it was all viable with mild dilatation.  The distal part I ran down to the area of the urostomy and then the terminal ileum and this appeared viable.  The abdomen was copiously irrigated.  Decision was made to bring out the proximal small bowel with an ileostomy and bring out the distal  part of the mucous fistula.  Circular incisions were made both in the right upper quadrant and left lower quadrant subcutaneous tissues were dissected down and cruciate incisions were made in the fascia and an opening was made in each site to allow the passage of the small bowel.  The mucous fistula was brought out to the right upper quadrant and the ileostomy was brought out in the left lower quadrant.  I then did some limited debridement of the fascial edges.  I decided to close the abdomen with interrupted #1 Novafil in a figure-of-eight fashion.  This was accomplished without difficulty or significant tension.  I then matured the mucous fistula in the left upper quadrant followed by the ileostomy in the right lower quadrant.  This completed the procedure.  All counts were correct.  Ostomy appliances were placed on the mucous fistula and ileostomy.  The Foley was removed from the urostomy and an appliance was placed there.  He tolerated the procedure without apparent complication was taken back to the intensive care unit on the ventilator.  PATIENT DISPOSITION:  ICU - intubated and critically ill.   Delay start of Pharmacological VTE agent (>24hrs) due to surgical blood loss or risk of bleeding:  no  Georganna Skeans, MD, MPH, FACS Pager: [phone]  12/15/202110:36 AM

## 2020-11-26 NOTE — Progress Notes (Signed)
PHARMACY - TOTAL PARENTERAL NUTRITION CONSULT NOTE  Indication: Small bowel obstruction, prolonged ileus  Patient Measurements: Height: 5\' 11"  (180.3 cm) Weight: 61.2 kg (134 lb 14.7 oz) IBW/kg (Calculated) : 75.3 TPN AdjBW (KG): 60.8 Body mass index is 18.82 kg/m. Usual Weight: 137 lbs  Assessment:  74 YOM presented 12/05/2020 with nausea, vomiting, abdominal pain after leaving AMA at [facility] with known SBO. Patient has been on bowel rest with NG tube decompression. Patient continued to have persistent SBO on imaging with no evidence of improvement, now s/p ex-lap with LoA, SBR and placement of Gtube on 11/12/2020. Pharmacy consulted for TPN management.  Expected prolonged ileus.  Patient remains in ICU, extubated and then re-intubated x3, sedated on fentanyl PRN + Precedex.  Glucose / Insulin: no hx DM - CBGs 140-150s, 8 units insulin used in past 24h Electrolytes: K 4.8 (K; goal >/= 4 for ileus) Mg 2.4 (Mag goal >/=2 with ileus), others wnl Renal: Improved 1.39>1.13 (BL 0.9-1.1), BUN 64>51 LFTs / TGs: LFTs / TG WNL, Tbili down 3>1.7 Prealbumin / albumin: BL prealbumin low 6.7>5.5, albumin 1.3 Intake / Output; MIVF: UOP stable 1.1 ml/kg/hr, Gastrostomy 357ml, NGT output 533mL/24hrs, net +11L since admit; LBM 12/12 GI Imaging: 12/11 DG Abd - stomach gas-filled, without distention, no obvious free air Surgeries / Procedures: 12/12 back to OR for re-exploration and washout with new copious drainage from incision concerning for enteric leak vs. Enterotomy >> now s/p, bowel necrosis resected 12/15: Back for exlap  Central access: PICC placed 11/25/2020 TPN start date: 11/19/20   Nutritional Goals (per RD recommendations 12/8): 2100-2300 kCal, 120-135g protein, >2L fluid per day  Current Nutrition:  TPN; NPO (Goal Pivot 1.5 = 60 ml/hr when able to start)  Plan:  Continue TPN at goal rate 90 ml/hr at 1800 TPN will provide 121g AA, 389g CHO, and 31g SMOF lipids (15% of total kCal due to  [employer]), for a total of 2120 kCal, meeting 100% of needs. Electrolytes in TPN: Continue decreased K, Mg (may increase with further renal improvement), max acetate Add standard daily multivitamin and trace elements + folic acid 1mg  (on supplementation PTA) in TPN Continue sensitive SSI Q6H TPN labs in AM   Bertis Ruddy, PharmD Clinical Pharmacist Please check AMION for all [place] numbers 12/12/2020 11:10 AM

## 2020-11-26 NOTE — Progress Notes (Signed)
Patient arrived back from OR and placed back on full vent support.

## 2020-11-26 NOTE — Transfer of Care (Signed)
Immediate Anesthesia Transfer of Care Note  Patient: Ronald Wolf  Procedure(s) Performed: EXPLORATORY LAPAROTOMY AND ABDOMINAL CLOSURE (N/A Abdomen) ILEOSTOMY WITH MUCOUS FISTULA CREATION (N/A Abdomen)  Patient Location: ICU  Anesthesia Type:General  Level of Consciousness: Patient remains intubated per anesthesia plan  Airway & Oxygen Therapy: Patient remains intubated per anesthesia plan and Patient placed on Ventilator (see vital sign flow sheet for setting)  Post-op Assessment: Report given to RN and Post -op Vital signs reviewed and stable  Post vital signs: Reviewed and stable  Last Vitals:  Vitals Value Taken Time  BP 120/64 11/12/2020 1100  Temp    Pulse 97 11/30/2020 1107  Resp 18 11/17/2020 1107  SpO2 100 % 12/01/2020 1107  Vitals shown include unvalidated device data.  Last Pain:  Vitals:   11/13/2020 0836  TempSrc: Axillary  PainSc:          Complications: No complications documented.

## 2020-11-26 NOTE — Anesthesia Preprocedure Evaluation (Addendum)
Anesthesia Evaluation  Patient identified by MRN, date of birth, ID band Patient unresponsive    Reviewed: Allergy & Precautions, H&P , NPO status , Patient's Chart, lab work & pertinent test results  Airway Mallampati: Intubated       Dental no notable dental hx. (+) Teeth Intact, Dental Advisory Given   Pulmonary Current Smoker and Patient abstained from smoking.,  VDRF   Pulmonary exam normal breath sounds clear to auscultation       Cardiovascular hypertension, + Peripheral Vascular Disease   Rhythm:Irregular Rate:Normal     Neuro/Psych Anxiety Depression CVA    GI/Hepatic GERD  ,(+) Hepatitis -, C  Endo/Other  negative endocrine ROS  Renal/GU negative Renal ROS  negative genitourinary   Musculoskeletal  (+) Arthritis ,   Abdominal   Peds  Hematology  (+) Blood dyscrasia, anemia ,   Anesthesia Other Findings   Reproductive/Obstetrics negative OB ROS                            Anesthesia Physical Anesthesia Plan  ASA: IV  Anesthesia Plan: General   Post-op Pain Management:    Induction: Intravenous  PONV Risk Score and Plan: 1 and Treatment may vary due to age or medical condition and Ondansetron  Airway Management Planned: Oral ETT  Additional Equipment:   Intra-op Plan:   Post-operative Plan: Post-operative intubation/ventilation  Informed Consent: I have reviewed the patients History and Physical, chart, labs and discussed the procedure including the risks, benefits and alternatives for the proposed anesthesia with the patient or authorized representative who has indicated his/her understanding and acceptance.     Dental advisory given  Plan Discussed with: CRNA  Anesthesia Plan Comments:         Anesthesia Quick Evaluation

## 2020-11-26 NOTE — Anesthesia Postprocedure Evaluation (Signed)
Anesthesia Post Note  Patient: BRANDO TAVES  Procedure(s) Performed: EXPLORATORY LAPAROTOMY AND ABDOMINAL CLOSURE (N/A Abdomen) ILEOSTOMY WITH MUCOUS FISTULA CREATION (N/A Abdomen)     Patient location during evaluation: SICU Anesthesia Type: General Level of consciousness: sedated Pain management: pain level controlled Vital Signs Assessment: post-procedure vital signs reviewed and stable Respiratory status: patient remains intubated per anesthesia plan Cardiovascular status: stable Postop Assessment: no apparent nausea or vomiting Anesthetic complications: no   No complications documented.  Last Vitals:  Vitals:   11/21/2020 1104 11/20/2020 1200  BP: 120/64 (!) 155/75  Pulse: 92 71  Resp: 15 15  Temp:  36.9 C  SpO2: 99% 100%    Last Pain:  Vitals:   11/30/2020 1200  TempSrc: Axillary  PainSc:                  Mike Berntsen,W. EDMOND

## 2020-11-27 ENCOUNTER — Encounter (HOSPITAL_COMMUNITY): Payer: Self-pay | Admitting: General Surgery

## 2020-11-27 ENCOUNTER — Inpatient Hospital Stay (HOSPITAL_COMMUNITY): Payer: Medicare PPO

## 2020-11-27 DIAGNOSIS — R579 Shock, unspecified: Secondary | ICD-10-CM

## 2020-11-27 LAB — CBC
HCT: 30.9 % — ABNORMAL LOW (ref 39.0–52.0)
Hemoglobin: 9.3 g/dL — ABNORMAL LOW (ref 13.0–17.0)
MCH: 31.3 pg (ref 26.0–34.0)
MCHC: 30.1 g/dL (ref 30.0–36.0)
MCV: 104 fL — ABNORMAL HIGH (ref 80.0–100.0)
Platelets: 230 10*3/uL (ref 150–400)
RBC: 2.97 MIL/uL — ABNORMAL LOW (ref 4.22–5.81)
RDW: 15.2 % (ref 11.5–15.5)
WBC: 33 10*3/uL — ABNORMAL HIGH (ref 4.0–10.5)
nRBC: 1 % — ABNORMAL HIGH (ref 0.0–0.2)

## 2020-11-27 LAB — GLUCOSE, CAPILLARY
Glucose-Capillary: 131 mg/dL — ABNORMAL HIGH (ref 70–99)
Glucose-Capillary: 163 mg/dL — ABNORMAL HIGH (ref 70–99)
Glucose-Capillary: 168 mg/dL — ABNORMAL HIGH (ref 70–99)
Glucose-Capillary: 181 mg/dL — ABNORMAL HIGH (ref 70–99)

## 2020-11-27 LAB — COMPREHENSIVE METABOLIC PANEL
ALT: 21 U/L (ref 0–44)
AST: 26 U/L (ref 15–41)
Albumin: 1.4 g/dL — ABNORMAL LOW (ref 3.5–5.0)
Alkaline Phosphatase: 83 U/L (ref 38–126)
Anion gap: 8 (ref 5–15)
BUN: 55 mg/dL — ABNORMAL HIGH (ref 8–23)
CO2: 25 mmol/L (ref 22–32)
Calcium: 8.2 mg/dL — ABNORMAL LOW (ref 8.9–10.3)
Chloride: 110 mmol/L (ref 98–111)
Creatinine, Ser: 1.11 mg/dL (ref 0.61–1.24)
GFR, Estimated: >60 mL/min (ref >60)
Glucose, Bld: 181 mg/dL — ABNORMAL HIGH (ref 70–99)
Potassium: 5 mmol/L (ref 3.5–5.1)
Sodium: 143 mmol/L (ref 135–145)
Total Bilirubin: 1.6 mg/dL — ABNORMAL HIGH (ref 0.3–1.2)
Total Protein: 4.7 g/dL — ABNORMAL LOW (ref 6.5–8.1)

## 2020-11-27 LAB — MAGNESIUM: Magnesium: 2.2 mg/dL (ref 1.7–2.4)

## 2020-11-27 LAB — TROPONIN I (HIGH SENSITIVITY): Troponin I (High Sensitivity): 30 ng/L — ABNORMAL HIGH (ref <18)

## 2020-11-27 LAB — PHOSPHORUS: Phosphorus: 3.1 mg/dL (ref 2.5–4.6)

## 2020-11-27 MED ORDER — TRAVASOL 10 % IV SOLN
INTRAVENOUS | Status: AC
  Filled 2020-11-27: qty 1209.6

## 2020-11-27 NOTE — Progress Notes (Signed)
NAME:  Ronald [facility], MRN:  [id number], DOB:  1946/06/04, LOS: 69 ADMISSION DATE:  11/29/2020, CONSULTATION DATE:  12/06/2020 REFERRING MD: [facility] CHIEF COMPLAINT:  hypotension   Brief History   Ronald Wolf is a 74 yo M w/ PMH of CVA, Seizures, Dementia, Bladder ca s/p urostomy, tobacco use, right glottic carcinoma s/p excision presenting with sbo. Found to have ischemic bowel requiring ex-lap. [facility] consulted due to post-op hypotension / respiratory distress  History of present illness   Ronald Wolf is a 74 yo M w/ above PMH who initially presented to [facility] on 11/11/20 with abdominal pain and nausea of 4-5 day duration. He was found to have SBO but left against medical advice as his family did not feel comfortable with him receiving his medical care in [facility]. He came to [facility] the next day with similar complaint and was found to have SBO. He was treated with supportive care with bowel rest and ng suction. However his symptoms did not improve. Surgery team was consulted and he was brough to OR for exploratory laparoscopy. He was found to have multiple sections of ischemic bowel which were resected. He was unable to be extubated post-operatively and [facility] was consulted.  Past Medical History  CVA, Dementia, Bladder CA s/p nephrostomy, tobacco use, right glottic carcinoma s/p excision, seizures.  Significant Hospital Events   11/20/2020 Admission 12/02/2020 OR 11/17/2020 Admit to ICU 12/10 Brief SVT with associated hypertension. Self limited Given fentanyl, metop and hydral for HTN 12/11 Good urine output with Lasix , failed extubation, reintubated within 2 hours due to inability to clear secretions 12/12 return OR for enteric leak-- found to have bowel necrosis. Left in discontinuity  12/13 self extubated, reintubated. On pressors. Family meeting with [facility] Wolf and [facility] to discuss possible next steps.  12/15-was back in OR, abdominal closure  Consults:  General  Surgery [facility] Procedures:  11/28/2020 Ex-lap, G-tube placement ETT 12/7 >> 12/11, >> 12/15-Buchinger for abdominal closure, exploratory laparotomy Significant Diagnostic Tests:   CT abd/pelvis 12/2 > High-grade distal small bowel obstruction, possibly due to adhesion. Mild ascites and diffuse mesenteric edema. No evidence of focal inflammatory process or abscess. Small bilateral pleural effusions and mild right lower lobe Atelectasis.  Micro Data:  12/06/2020 COVID/Flu negative 12/7 BA: Few GNR rare GPC -- predominantly PMN  12/7 BCx> NGTD  12/11 respiratory >>  Antimicrobials:  Fluc 12/8>> 12/10 Zosyn 12/8> 12/12  Interim history/subjective:   Did require increased FiO2, this has been weaned down currently Was on diltiazem for atrial fibrillation with rapid heart rate No other overnight events  Objective   Blood pressure (!) 155/71, pulse (!) 110, temperature 99.4 F (37.4 C), temperature source Axillary, resp. rate (!) 37, height 5\' 11"  (1.803 m), weight 65.6 kg, SpO2 90 %.    Vent Mode: PRVC FiO2 (%):  [40 %-100 %] 40 % Set Rate:  [15 bmp] 15 bmp Vt Set:  [450 mL] 450 mL PEEP:  [5 cmH20-8 cmH20] 8 cmH20 Plateau Pressure:  [13 cmH20-31 cmH20] 20 cmH20   Intake/Output Summary (Last 24 hours) at 11/27/2020 0759 Last data filed at 11/27/2020 0600 Gross per 24 hour  Intake 2108.67 ml  Output 2715 ml  Net -606.33 ml   Filed Weights   11/25/20 1014 11/22/2020 0500 11/27/20 0433  Weight: 64 kg 64.8 kg 65.6 kg    Examination: Gen: Chronically ill-appearing, frail  HEENT: dry oral mucosa,ETT in place Lungs:  Decreased AE, no added sounds  CV:  S1S2 appreciated  Abd:  multiple ostomies GU:  RUQ urostomy Ext: decreased muscle mass Skin: c/d/w without rash  Neuro: awake, frail, not following commands  Resolved Hospital Problem list   N/A  Assessment & Plan:   Acute encephalopathy Multifactorial encephalopathy -Minimize sedation -Still requires adequate pain management  with fentanyl and Precedex -RASS goal of 0 to -1  Acute hypoxic respiratory failure requiring mechanical ventilation -Has been intubated 3 times -Weaning and support as able -VAP protocol in place -We will require tracheostomy if management remains aggressive care  SBO Ischemic bowel with perforation -Exploratory lap on 12/15-viable bowel -Has high ileostomy in place -Surgery continues to follow -Discussed with [facility] at bedside -May initiate enteral nutrition on 12/17  Shock -Did require pressors, pressors have been off  Acute kidney injury -Stabilizing  SVT A. fib with RVR Hypertension -Metoprolol -No systemic anticoagulation at present  Protein calorie malnutrition -Currently on TPN -Plan will be enteric nutrition on 1217 if appropriate  Seizure disorder -History of epilepsy on Keppra  History of bladder cancer -Urostomy in place   Anemia -Continue to trend  Goals of Care -poor baseline due to multiple medical problems, post emergent surgery with slow recovery, failed extubations (12/11, 12/13) due to general deconditioning and inability to clear secretions.  -12/13 Family discussion with [facility] and [facility]. Family has decided to pursue all offered aggressive interventions (return OR for abd, trach, full code)  Best practice (evaluated daily)  Diet: TPN  Pain/Anxiety/Delirium protocol (if indicated): Fent, precedex VAP protocol (if indicated): Yes  DVT prophylaxis: SQH  GI prophylaxis: PPI Glucose control: SSI  Mobility:BR  Family updated -- Joint family meeting with Ronald Wolf ans Ronald Wolf (Ronald Wolf, Ronald Wolf, Ronald Wolf) 12/13. Summary of conversation: Family is contemplating care trajectory moving forward (surgeries, trach, peg, etc).  12/14 Family has indicated desire for all offered aggressive interventions including return to OR for exlap, trach, long term TPN if indicated etc. Brother [facility] Wolf at bedside 12/15-- consent for OR not given overnight,  questions from daughter and sister. [facility] at bedside 12/15 AM.  Code Status: Full Disposition: ICU  Labs   CBC: Recent Labs  Lab 11/21/20 0636 11/22/20 1526 12/08/2020 1010 11/24/20 0842 11/24/20 1154 11/25/20 0649 11/21/2020 1423 12/09/2020 2106 11/12/2020 2113  WBC 19.2*  --  16.9*  --  27.5* 34.6* 27.3*  --   --   NEUTROABS  --   --   --   --  22.3*  --   --   --   --   HGB 9.6*   < > 9.2*   < > 9.6* 9.2* 9.4* 8.8* 9.2*  HCT 28.2*   < > 28.6*   < > 32.1* 28.8* 31.0* 26.0* 27.0*  MCV 96.6  --  97.3  --  104.2* 101.4* 103.3*  --   --   PLT PLATELET CLUMPS NOTED ON SMEAR, COUNT APPEARS DECREASED  --  91*  --  103* 146* 188  --   --    < > = values in this interval not displayed.    Basic Metabolic Panel: Recent Labs  Lab 11/21/20 0500 11/22/20 0647 11/22/20 1526 11/20/2020 1010 11/24/20 0842 11/24/20 1154 11/25/20 0649 11/25/20 2105 11/27/2020 1039 12/08/2020 2106 12/09/2020 2113  NA 140 140   < > 141   < > 142 143 140 141 142 143  K 3.3* 3.6   < > 3.9   < > 5.6* 4.8 4.7 4.8 4.7 4.7  CL 108 107  --  105  --  108 108 108 108  --   --   CO2 22 20*  --  23  --  28 23 24 25   --   --   GLUCOSE 137* 155*  --  163*  --  280* 154* 167* 334*  --   --   BUN 43* 54*  --  57*  --  64* 60* 52* 51*  --   --   CREATININE 1.70* 1.47*  --  1.38*  --  1.45* 1.39* 1.21 1.13  --   --   CALCIUM 7.0* 7.1*  --  7.6*  --  7.6* 7.8* 7.9* 7.8*  --   --   MG 2.5* 1.8  --  1.8  --  2.6* 2.3 2.3 2.4  --   --   PHOS 2.5 3.1  --  3.2  --  4.3  --   --   --   --   --    < > = values in this interval not displayed.   GFR: Estimated Creatinine Clearance: 53.2 mL/min (by C-G formula based on SCr of 1.13 mg/dL). Recent Labs  Lab 11/22/2020 1010 11/24/20 1154 11/25/20 0649 12/06/2020 1423  WBC 16.9* 27.5* 34.6* 27.3*    Liver Function Tests: Recent Labs  Lab 11/13/2020 1010 11/24/20 1154  AST 31 33  ALT 13 16  ALKPHOS 42 56  BILITOT 3.0* 1.7*  PROT 4.6* 4.8*  ALBUMIN 1.3* 1.3*   No results  for input(s): LIPASE, AMYLASE in the last 168 hours. No results for input(s): AMMONIA in the last 168 hours.  ABG    Component Value Date/Time   PHART 7.361 11/14/2020 2113   PCO2ART 47.6 11/13/2020 2113   PO2ART 41 (L) 11/20/2020 2113   HCO3 26.9 11/12/2020 2113   TCO2 28 11/14/2020 2113   ACIDBASEDEF 2.0 12/10/2020 1658   O2SAT 72.0 11/15/2020 2113     Coagulation Profile: No results for input(s): INR, PROTIME in the last 168 hours.  Cardiac Enzymes: No results for input(s): CKTOTAL, CKMB, CKMBINDEX, TROPONINI in the last 168 hours.  HbA1C: Hgb A1c MFr Bld  Date/Time Value Ref Range Status  11/19/2020 10:37 AM 5.0 4.8 - 5.6 % Final    Comment:    (NOTE) Pre diabetes:          5.7%-6.4%  Diabetes:              >6.4%  Glycemic control for   <7.0% adults with diabetes   12/17/2019 08:19 AM 4.5 (L) 4.8 - 5.6 % Final    Comment:    (NOTE) Pre diabetes:          5.7%-6.4% Diabetes:              >6.4% Glycemic control for   <7.0% adults with diabetes    The patient is critically ill with multiple organ systems failure and requires high complexity decision making for assessment and support, frequent evaluation and titration of therapies, application of advanced monitoring technologies and extensive interpretation of multiple databases. Critical Care Time devoted to patient care services described in this note independent of APP/resident time (if applicable)  is 32 minutes.   Sherrilyn Rist MD [facility] Personal pager: [phone] If unanswered, please page CCM On-call: [phone]

## 2020-11-27 NOTE — Progress Notes (Signed)
1 Day Post-Op   Subjective/Chief Complaint: On vent SOme agitation Had desat overnight   Objective: Vital signs in last 24 hours: Temp:  [98.3 F (36.8 C)-99.5 F (37.5 C)] 99.4 F (37.4 C) (12/16 0400) Pulse Rate:  [30-116] 110 (12/16 0730) Resp:  [15-37] 37 (12/16 0730) BP: (106-194)/(64-139) 155/71 (12/16 0700) SpO2:  [19 %-100 %] 90 % (12/16 0730) FiO2 (%):  [40 %-100 %] 40 % (12/16 0421) Weight:  [65.6 kg] 65.6 kg (12/16 0433) Last BM Date: 11/22/20  Intake/Output from previous day: 12/15 0701 - 12/16 0700 In: 2108.7 [I.V.:1900.7; IV Piggyback:208] Out: 2715 [Urine:1815; Emesis/NG output:200; Drains:130; Stool:550; Blood:20] Intake/Output this shift: No intake/output data recorded.  General appearance: on vent Resp: rhonchi R>L Cardio: regular rate and rhythm GI: wound OK, ileostomy with some liquid output, G tube, mucous fistula dark pink min output, urostomy Neurologic: Mental status: agitated  Lab Results:  Recent Labs    11/25/20 0649 11/13/2020 1423 11/24/2020 2106 11/24/2020 2113  WBC 34.6* 27.3*  --   --   HGB 9.2* 9.4* 8.8* 9.2*  HCT 28.8* 31.0* 26.0* 27.0*  PLT 146* 188  --   --    BMET Recent Labs    11/25/20 2105 12/04/2020 1039 12/08/2020 2106 12/01/2020 2113  NA 140 141 142 143  K 4.7 4.8 4.7 4.7  CL 108 108  --   --   CO2 24 25  --   --   GLUCOSE 167* 334*  --   --   BUN 52* 51*  --   --   CREATININE 1.21 1.13  --   --   CALCIUM 7.9* 7.8*  --   --    PT/INR No results for input(s): LABPROT, INR in the last 72 hours. ABG Recent Labs    11/25/2020 2106 11/16/2020 2113  PHART 7.334* 7.361  HCO3 28.0 26.9    Studies/Results: No results found.  Anti-infectives: Anti-infectives (From admission, onward)   Start     Dose/Rate Route Frequency Ordered Stop   11/25/2020 0945  cefoTEtan (CEFOTAN) 2 g in sodium chloride 0.9 % 100 mL IVPB  Status:  Discontinued        2 g 200 mL/hr over 30 Minutes Intravenous To Surgery 12/01/2020 0936 11/21/2020 1057    11/23/2020 0900  cefoTEtan (CEFOTAN) 2 g in sodium chloride 0.9 % 100 mL IVPB  Status:  Discontinued        2 g 200 mL/hr over 30 Minutes Intravenous  Once 11/25/2020 0833 11/16/2020 0936   11/19/20 1130  piperacillin-tazobactam (ZOSYN) IVPB 3.375 g        3.375 g 12.5 mL/hr over 240 Minutes Intravenous Every 8 hours 11/19/20 1017 11/25/2020 2359   11/19/20 1115  fluconazole (DIFLUCAN) IVPB 200 mg        200 mg 100 mL/hr over 60 Minutes Intravenous Every 24 hours 11/19/20 1017 11/21/20 1104   11/19/20 0915  Ampicillin-Sulbactam (UNASYN) 3 g in sodium chloride 0.9 % 100 mL IVPB  Status:  Discontinued        3 g 200 mL/hr over 30 Minutes Intravenous Every 12 hours 11/19/20 0826 11/19/20 0949      Assessment/Plan: SBO - s/p ex lap with SBR, placement of a gastrostomy tube 12/7 by Dr. Georgette Dover. Takeback 12/12 by Dr. Zenia Resides for necrotic SB with resection of additional 60-70cm of SB inclusive of prior anastomosis in discontinuity with open abdomen.  - S/P ex lap, ileostomy, mucous fistula, closure 12/15 by Dr. Grandville Silos - consider TF per  G tube tomorrow Malnourishment - continued clinical decline in a patient who was previously quite deconditioned. Lengthy goals of care discussion with family are ongoing VDRF - self-extubated 12/13 AM, re-intubated ~3h after. Second time failing extubation, desat overnight, PEEP 8, per CCM, CXR now. Likely would need trach to come off the vent Shock/ID - likely septic, completed 5d zosyn, necrotic bowel was most likely source. Pressors back off CV/AF RVR - back in NSR this AM FEN-NPO/GT/TPN. See above VTE-SQH Dispo - ICU I spoke with Dr. Ander Slade at the bedside.  LOS: 15 days    Zenovia Jarred 11/27/2020

## 2020-11-27 NOTE — Progress Notes (Signed)
PHARMACY - TOTAL PARENTERAL NUTRITION CONSULT NOTE  Indication: Small bowel obstruction, prolonged ileus  Patient Measurements: Height: 5\' 11"  (180.3 cm) Weight: 61.2 kg (134 lb 14.7 oz) IBW/kg (Calculated) : 75.3 TPN AdjBW (KG): 60.8 Body mass index is 18.82 kg/m. Usual Weight: 137 lbs  Assessment:  74 YOM presented 11/14/2020 with nausea, vomiting, abdominal pain after leaving AMA at [facility] with known SBO. Patient has been on bowel rest with NG tube decompression. Patient continued to have persistent SBO on imaging with no evidence of improvement, now s/p ex-lap with LoA, SBR and placement of Gtube on 11/12/2020. Pharmacy consulted for TPN management.  Expected prolonged ileus.  Patient remains in ICU, extubated and then re-intubated x3, sedated on fentanyl PRN + Precedex.  Glucose / Insulin: no hx DM - CBGs 150-180s, at goal of <180, 7 units SSI/24h Electrolytes: K 5 (K; goal >/= 4 for ileus) Mg 2.2 (Mag goal >/=2 with ileus), Phos 3.1, CoCa 10.3, others wnl Renal: Improved - SCr 1.11 (BL 0.9-1.1), BUN 55 LFTs / TGs: LFTs / TG WNL, Tbili down 1.6 Prealbumin / albumin: BL prealbumin low 6.7>5.5, albumin 1.4 Intake / Output; MIVF: UOP stable 1.2 ml/kg/hr, Gastrostomy 160ml, NGT output 252mL/24hrs, net +11L since admit; LBM 12/11 GI Imaging: 12/11 DG Abd - stomach gas-filled, without distention, no obvious free air Surgeries / Procedures: 12/12 back to OR for re-exploration and washout with new copious drainage from incision concerning for enteric leak vs. Enterotomy >> now s/p, bowel necrosis resected 12/15: Back for exlap, ileostomy with fistula creation, abdominal closure  Central access: PICC placed 12/05/2020 TPN start date: 11/19/20   Nutritional Goals (per RD recommendations 12/13): 2100-2300 kCal, 120-135g protein, >2L fluid per day  Current Nutrition:  TPN; NPO (Goal Pivot 1.5 = 60 ml/hr when able to start)  Plan:  Continue TPN at goal rate 90 ml/hr at 1800 TPN will provide  121g AA, 389g CHO, and 31g SMOF lipids (15% of total kCal due to [employer]), for a total of 2120 kCal, meeting 100% of needs. Electrolytes in TPN: will remove K this evening to allow trend down and consider adding back 12/17, increase Mg slightly to 6 mEq/L given slow trend down, reduce Ca to 5 mEq/L, cont max acetate Add standard daily multivitamin and trace elements + folic acid 1mg  (on supplementation PTA) in TPN Continue sensitive SSI Q6H F/u BMET + Mg in AM  Thank you for allowing pharmacy to be a part of this patient's care.  Alycia Rossetti, PharmD, BCPS Clinical Pharmacist Clinical phone for 11/27/2020: [phone] 11/27/2020 7:36 AM   **Pharmacist phone directory can now be found on [email or web address] ([email or web address]).  Listed under [place].

## 2020-11-27 NOTE — Progress Notes (Signed)
Pt with episode of bradycardia. HR 30s. Pt stimulated, placed on 100% FiO2, and suctioned. Large amount of secretions suctioned out. Respiratory called to bedside to evaluate. Pt having multiple episodes of ectopy before returning to NSR. 12 lead EKG obtained. VS returned to baseline.   Five minutes later, pt began to desat again, as low as 64. Respiratory called again. Pt removed from vent by myself and ventilated via BVM. Ectopy/bradycardia returned and additional 12 lead obtained. Dr. Ander Slade paged and notified of pt condition and 12 lead report. Orders received for CXR and Troponin. Pt stabilized with returned to baseline with additional vent support. Will con't to monitor.

## 2020-11-27 NOTE — Progress Notes (Signed)
Called to evaluate patient  Desaturated with bradycardia into the 30s  Taken off the vent on bad  Patient now on PEEP of 10, FiO2 100%  Blood pressure study, heart rate in the 70s to 80s  More comfortable  Chest x-ray and cardiac enzymes ordered

## 2020-11-28 DIAGNOSIS — Z515 Encounter for palliative care: Secondary | ICD-10-CM

## 2020-11-28 LAB — CBC
HCT: 24.7 % — ABNORMAL LOW (ref 39.0–52.0)
Hemoglobin: 7.3 g/dL — ABNORMAL LOW (ref 13.0–17.0)
MCH: 31.6 pg (ref 26.0–34.0)
MCHC: 29.6 g/dL — ABNORMAL LOW (ref 30.0–36.0)
MCV: 106.9 fL — ABNORMAL HIGH (ref 80.0–100.0)
Platelets: 208 10*3/uL (ref 150–400)
RBC: 2.31 MIL/uL — ABNORMAL LOW (ref 4.22–5.81)
RDW: 15.3 % (ref 11.5–15.5)
WBC: 26.3 10*3/uL — ABNORMAL HIGH (ref 4.0–10.5)
nRBC: 0.3 % — ABNORMAL HIGH (ref 0.0–0.2)

## 2020-11-28 LAB — GLUCOSE, CAPILLARY
Glucose-Capillary: 125 mg/dL — ABNORMAL HIGH (ref 70–99)
Glucose-Capillary: 151 mg/dL — ABNORMAL HIGH (ref 70–99)
Glucose-Capillary: 152 mg/dL — ABNORMAL HIGH (ref 70–99)
Glucose-Capillary: 160 mg/dL — ABNORMAL HIGH (ref 70–99)
Glucose-Capillary: 181 mg/dL — ABNORMAL HIGH (ref 70–99)

## 2020-11-28 LAB — BASIC METABOLIC PANEL
Anion gap: 6 (ref 5–15)
BUN: 63 mg/dL — ABNORMAL HIGH (ref 8–23)
CO2: 29 mmol/L (ref 22–32)
Calcium: 8.2 mg/dL — ABNORMAL LOW (ref 8.9–10.3)
Chloride: 110 mmol/L (ref 98–111)
Creatinine, Ser: 1.13 mg/dL (ref 0.61–1.24)
GFR, Estimated: >60 mL/min (ref >60)
Glucose, Bld: 189 mg/dL — ABNORMAL HIGH (ref 70–99)
Potassium: 4.7 mmol/L (ref 3.5–5.1)
Sodium: 145 mmol/L (ref 135–145)

## 2020-11-28 LAB — MAGNESIUM: Magnesium: 2.2 mg/dL (ref 1.7–2.4)

## 2020-11-28 MED ORDER — PROSOURCE TF PO LIQD
45.0000 mL | Freq: Two times a day (BID) | ORAL | Status: DC
  Administered 2020-11-28 – 2020-12-02 (×9): 45 mL
  Filled 2020-11-28 (×9): qty 45

## 2020-11-28 MED ORDER — FUROSEMIDE 10 MG/ML IJ SOLN
40.0000 mg | Freq: Once | INTRAMUSCULAR | Status: AC
  Administered 2020-11-28: 10:00:00 40 mg via INTRAVENOUS
  Filled 2020-11-28: qty 4

## 2020-11-28 MED ORDER — METOPROLOL TARTRATE 5 MG/5ML IV SOLN
2.5000 mg | Freq: Two times a day (BID) | INTRAVENOUS | Status: DC
  Administered 2020-11-28 – 2020-11-29 (×2): 2.5 mg via INTRAVENOUS
  Filled 2020-11-28 (×2): qty 5

## 2020-11-28 MED ORDER — VITAL HIGH PROTEIN PO LIQD: 1000.0000 mL | ORAL | Status: DC

## 2020-11-28 MED ORDER — TRAVASOL 10 % IV SOLN
INTRAVENOUS | Status: AC
  Filled 2020-11-28: qty 1209.6

## 2020-11-28 MED ORDER — PIVOT 1.5 CAL PO LIQD
1000.0000 mL | ORAL | Status: DC
  Administered 2020-11-29 – 2020-12-01 (×3): 1000 mL
  Filled 2020-11-28 (×2): qty 1000

## 2020-11-28 MED ORDER — INSULIN ASPART 100 UNIT/ML ~~LOC~~ SOLN
0.0000 [IU] | SUBCUTANEOUS | Status: DC
  Administered 2020-11-28: 12:00:00 2 [IU] via SUBCUTANEOUS
  Administered 2020-11-28: 17:00:00 1 [IU] via SUBCUTANEOUS
  Administered 2020-11-28: 21:00:00 2 [IU] via SUBCUTANEOUS
  Administered 2020-11-29: 20:00:00 1 [IU] via SUBCUTANEOUS
  Administered 2020-11-29 (×2): 2 [IU] via SUBCUTANEOUS
  Administered 2020-11-29: 16:00:00 1 [IU] via SUBCUTANEOUS
  Administered 2020-11-29 (×2): 2 [IU] via SUBCUTANEOUS
  Administered 2020-11-30 – 2020-12-03 (×6): 1 [IU] via SUBCUTANEOUS
  Administered 2020-12-03: 21:00:00 2 [IU] via SUBCUTANEOUS
  Administered 2020-12-03 (×2): 1 [IU] via SUBCUTANEOUS
  Administered 2020-12-04 (×3): 2 [IU] via SUBCUTANEOUS

## 2020-11-28 NOTE — Progress Notes (Addendum)
2 Days Post-Op   Subjective/Chief Complaint: On vent   Objective: Vital signs in last 24 hours: Temp:  [98.7 F (37.1 C)-101.6 F (38.7 C)] 99.4 F (37.4 C) (12/17 0400) Pulse Rate:  [72-93] 83 (12/17 0700) Resp:  [13-26] 14 (12/17 0700) BP: (86-187)/(53-70) 92/53 (12/17 0700) SpO2:  [93 %-100 %] 100 % (12/17 0700) FiO2 (%):  [40 %-100 %] 60 % (12/17 0819) Weight:  [66 kg] 66 kg (12/17 0500) Last BM Date: 11/27/20 (ostomy)  Intake/Output from previous day: 12/16 0701 - 12/17 0700 In: 2798.1 [I.V.:2582.1; IV Piggyback:216.1] Out: 2155 [Urine:1415; Emesis/NG output:50; Drains:150; Stool:540] Intake/Output this shift: No intake/output data recorded.  General appearance: no distress Resp: some rhonchi Cardio: regular rate and rhythm GI: soft, ileostomy pink with output, mucous fistula purple and minimal output, G tube and iurostomy Extremities: calves soft  Lab Results:  Recent Labs    11/27/20 0810 11/28/20 0646  WBC 33.0* 26.3*  HGB 9.3* 7.3*  HCT 30.9* 24.7*  PLT 230 208   BMET Recent Labs    11/27/20 0810 11/28/20 0646  NA 143 145  K 5.0 4.7  CL 110 110  CO2 25 29  GLUCOSE 181* 189*  BUN 55* 63*  CREATININE 1.11 1.13  CALCIUM 8.2* 8.2*   PT/INR No results for input(s): LABPROT, INR in the last 72 hours. ABG Recent Labs    12/10/2020 2106 11/16/2020 2113  PHART 7.334* 7.361  HCO3 28.0 26.9    Studies/Results: DG CHEST PORT 1 VIEW  Result Date: 11/27/2020 CLINICAL DATA:  Endotracheal tube present. EXAM: PORTABLE CHEST 1 VIEW COMPARISON:  Radiograph earlier today. FINDINGS: The endotracheal tube tip is 5 cm in the carina. Enteric tube tip below the diaphragm not included in the field of view. Left upper extremity central line tip at the atrial caval junction. Improved right lung volumes with decreased rightward mediastinal shift. There are hazy opacities in both lower lung zones. No pneumothorax or pulmonary edema. IMPRESSION: 1. Endotracheal tube tip  5 cm in the carina. Enteric tube tip below the diaphragm not included in the field of view. Left upper extremity central line in place. 2. Improved right lung volumes with decreased rightward mediastinal shift. 3. Hazy opacities at both lower lung zones consistent with pleural effusions and atelectasis. Electronically Signed   By: Keith Rake M.D.   On: 11/27/2020 17:41   DG CHEST PORT 1 VIEW  Result Date: 11/27/2020 CLINICAL DATA:  Check endotracheal tube placement EXAM: PORTABLE CHEST 1 VIEW COMPARISON:  11/24/2020 FINDINGS: Endotracheal tube is again seen in satisfactory position. Left-sided PICC line is noted at the cavoatrial junction. Gastric catheter extends into the stomach. Cardiac shadow is stable. Right-sided pleural effusion with underlying atelectasis is noted. Left lung remains clear. IMPRESSION: Tubes and lines as described in satisfactory position. Increasing right-sided effusion and basilar atelectasis. Electronically Signed   By: Inez Catalina M.D.   On: 11/27/2020 12:34    Anti-infectives: Anti-infectives (From admission, onward)   Start     Dose/Rate Route Frequency Ordered Stop   12/01/2020 0945  cefoTEtan (CEFOTAN) 2 g in sodium chloride 0.9 % 100 mL IVPB  Status:  Discontinued        2 g 200 mL/hr over 30 Minutes Intravenous To Surgery 12/07/2020 0936 12/05/2020 1057   12/01/2020 0900  cefoTEtan (CEFOTAN) 2 g in sodium chloride 0.9 % 100 mL IVPB  Status:  Discontinued        2 g 200 mL/hr over 30 Minutes Intravenous  Once  11/16/2020 0833 12/10/2020 0936   11/19/20 1130  piperacillin-tazobactam (ZOSYN) IVPB 3.375 g        3.375 g 12.5 mL/hr over 240 Minutes Intravenous Every 8 hours 11/19/20 1017 11/30/2020 2359   11/19/20 1115  fluconazole (DIFLUCAN) IVPB 200 mg        200 mg 100 mL/hr over 60 Minutes Intravenous Every 24 hours 11/19/20 1017 11/21/20 1104   11/19/20 0915  Ampicillin-Sulbactam (UNASYN) 3 g in sodium chloride 0.9 % 100 mL IVPB  Status:  Discontinued        3 g 200  mL/hr over 30 Minutes Intravenous Every 12 hours 11/19/20 0826 11/19/20 0949      Assessment/Plan: SBO - s/p ex lap with SBR, placement of a gastrostomy tube 12/7 by Dr. Georgette Dover. Takeback 12/12 by Dr. Zenia Resides for necrotic SB with resection of additional 60-70cm of SB inclusive of prior anastomosis in discontinuity with open abdomen.  - S/P ex lap, ileostomy, mucous fistula, closure 12/15 by Dr. Grandville Silos - consider TF per G tube tomorrow Malnourishment - continued clinical decline in a patient who was previously quite deconditioned. Lengthy goals of care discussion with family are ongoing VDRF - self-extubated 12/13 AM, re-intubated ~3h after. Second time failing extubation, desat overnight, PEEP 8, per CCM, CXR now. Likely would need trach to come off the vent. On FiO2 60% and PEEP 10 so cannot trach yet. Unsure if family will want that as well. Shock/ID - likely septic, completed 5d zosyn, necrotic bowel was most likely source. Pressors back off CV/AF RVR - back in NSR FEN-start TF via G tube VTE-SQH Dispo - ICU I spoke with Dr. Ander Slade at the bedside. I also updated his fiancee at the bedside.  LOS: 16 days    R[facility] 11/28/2020

## 2020-11-28 NOTE — Progress Notes (Signed)
Nutrition Follow-up  DOCUMENTATION CODES:   Severe malnutrition in context of chronic illness  INTERVENTION:   TPN meeting nutrition goals  Initiate tube feeding via G-tube: Pivot 1.5 at 20 ml/h increasing by 10 ml every 8 hours to goal rate of 60 ml/h (1440 ml per day)  Provides 2160 kcal, 135 gm protein, 1092 ml free water daily   NUTRITION DIAGNOSIS:   Severe Malnutrition related to chronic illness as evidenced by severe fat depletion,severe muscle depletion. Ongoing.   GOAL:   Patient will meet greater than or equal to 90% of their needs Meeting with TPN and TF advancement   MONITOR:   I & O's  REASON FOR ASSESSMENT:   Consult Enteral/tube feeding initiation and management  ASSESSMENT:   74 year old male with history of cognitive impairment, CVA, HTN, bladder cancer now with urostomy, right-sided glottic carcinoma s/p excision brought to ED with nausea, vomiting and abdominal pain.  Work-up in ED revealed small bowel obstruction.  He also had AKI and lactic acidosis.  Started on bowel rest with NG tube decompression and IV fluid.  General surgery following.  Subsequent x-rays with persistent SBO.  Pt discussed during ICU rounds and with RN. TPN at goal rate. Discussed with surgery and pharmacy. Plan to start TF today but continue TPN until tolerance established. If tolerating TF tomorrow will wean TPN.   12/7 s/p ex lap, extensive LOA, SBR, g-tube placement; s/p bronch  12/10 TPN advanced to goal rate - provides: 2120 kcal and 121 grams protein  12/12 back to OR for re-exploration and washout with new copious drainage from incision; pt s/p reopening of recent laparotomy, SBR without reanastomosis, abd washout, placement of VAC 12/15 s/p ex lap, ileostomy, mucous fistula, and closure  Patient is currently intubated on ventilator support MV: 9.2 L/min Temp (24hrs), Avg:99.3 F (37.4 C), Min:97.9 F (36.6 C), Max:101.6 F (38.7 C)  Medications reviewed and  include: SSI Precedex    Labs reviewed:  CBG's: 131-181-160  16 F NG tube:  50 ml  18 F G-tube: 150 ml    Diet Order:   Diet Order            Diet NPO time specified  Diet effective now                 EDUCATION NEEDS:   No education needs have been identified at this time  Skin:  Skin Assessment: Reviewed RN Assessment  Last BM:  540 ml via ileostomy  Height:   Ht Readings from Last 1 Encounters:  11/22/20 5\' 11"  (1.803 m)    Weight:   Wt Readings from Last 1 Encounters:  11/28/20 66 kg    Ideal Body Weight:  78.2 kg  BMI:  Body mass index is 20.29 kg/m.  Estimated Nutritional Needs:   Kcal:  2100-2300  Protein:  120-135 grams  Fluid:  > 2 L  Yarelli Decelles P., RD, LDN, CNSC See AMiON for contact information

## 2020-11-28 NOTE — Progress Notes (Signed)
[facility] Progress Note Patient Name: Ronald Wolf DOB: 05-22-1946 MRN: [id number]   Date of Service  11/28/2020  HPI/Events of Note  Patient needs wrist restraints order on patient to prevent self-extubation.  eICU Interventions  Left wrist restraints ordered.        Kerry Kass [name] 11/28/2020, 1:44 AM

## 2020-11-28 NOTE — Progress Notes (Signed)
PHARMACY - TOTAL PARENTERAL NUTRITION CONSULT NOTE  Indication: Small bowel obstruction, prolonged ileus  Patient Measurements: Height: 5\' 11"  (180.3 cm) Weight: 61.2 kg (134 lb 14.7 oz) IBW/kg (Calculated) : 75.3 TPN AdjBW (KG): 60.8 Body mass index is 18.82 kg/m. Usual Weight: 137 lbs  Assessment:  74 YOM presented 12/12/2020 with nausea, vomiting, abdominal pain after leaving AMA at [facility] with known SBO. Patient has been on bowel rest with NG tube decompression. Patient continued to have persistent SBO on imaging with no evidence of improvement, now s/p ex-lap with LoA, SBR and placement of Gtube on 11/26/2020. Pharmacy consulted for TPN management.  Expected prolonged ileus.  Patient remains in ICU, extubated and then re-intubated x3, sedated on Fentanyl PRN + Precedex.  Glucose / Insulin: no hx DM - CBGs 150-180s, at goal of <180, 7 units SSI yesterday Electrolytes: Na high normal, K down to 4.7 post removal from TPN 12/16, others WNL Renal: SCr 1.13, BUN up to 63 LFTs / TGs: LFTs / TG WNL, tbili down 1.6 Prealbumin / albumin: BL prealbumin low 6.7>5.5, albumin 1.4 Intake / Output; MIVF: UOP 0.9 ml/kg/hr, gastrostomy 116ml, NGT 97mL, stool 565mL, net +11.8L, LBM 12/11 GI Imaging:  12/11 DG Abd - stomach gas-filled, without distention, no obvious free air Surgeries / Procedures:  12/12: re-exploration and washout with new copious drainage from incision concerning for enteric leak vs. enterotomy >> now s/p, bowel necrosis resected 12/15: ex-lap with ileostomy with fistula creation, abdominal closure  Central access: PICC placed 11/28/2020 TPN start date: 11/19/20   Nutritional Goals (per RD recommendations 12/13): 2100-2300 kCal, 120-135g protein, >2L fluid per day  Current Nutrition:  TPN (Goal Pivot 1.5 = 60 ml/hr when able to start)  Plan:  Continue TPN at goal rate 90 ml/hr, providing 121g AA, 389g CHO, and 31g SMOF lipids (15% of total kCal due to [employer]), for a  total of 2120 kCal, meeting 100% of needs. Electrolytes in TPN: reduce Na to 18mEq/L, add back K 94mEq/L, Mag incr 70mEq/L on 12/16, Ca decr to 5 mEq/L on 12/16, Phos 12 mmol/L, max acetate Daily multivitamin and trace elements + folic acid 1mg  (on supplementation PTA) in TPN Continue sensitive SSI Q6H  F/U AM labs, CBGs, possibility of starting EN   Jonessa Triplett D. Mina Marble, PharmD, BCPS, [place] 11/28/2020, 8:42 AM

## 2020-11-28 NOTE — Consult Note (Signed)
Consultation Note Date: 11/28/2020   Patient Name: Ronald Wolf  DOB: 13-Sep-1946  MRN: [id number]  Age / Sex: 74 y.o., male  PCP: Mosie Lukes, MD Referring Physician: Laurin Coder, MD  Reason for Consultation: Establishing goals of care  HPI/Patient Profile: 74 y.o. male  with past medical history of bladder cancer s/p urostomy, seizure, disorder, right glottic cancer s/p excision, atrial fibrillation, tobacco abuse, CVA, hepatitis C, anxiety and depression who was admitted on 11/16/2020 with small bowel obstruction.  He had left AMA from [facility].  Initially he was treated with conservative measures but on 12/7 he underwent exploratory laproscopy and part of his small bowel was resected. He was taken back to the OR on 12/12 for further bowel resection.   On 12/15 he had abdominal closure and ileostomy placement.  During this time he has suffered from respiratory failure and been intubated 3 times.   Currently he is not weaning from the vent and vent settings are too high to allow for tracheostomy placement.  Patient is quite cachectic and remains on TPN.  He opens his eyes to voice and touch but does not respond to me otherwise.  Clinical Assessment and Goals of Care:  I have reviewed medical records including EPIC notes, labs and imaging, received report from Dr. Ander Slade, examined the patient, attempted to call his Dolly Rias, but reached his back up [employer] to discuss diagnosis prognosis, GOC, disposition and options.  I introduced Palliative Medicine as specialized medical care for people living with serious illness. It focuses on providing relief from the symptoms and stress of a serious illness, and provides an extra level of support for the family.   We discussed a brief life review of the patient. Dominie lives at home.  He is not married but has a long term girlfriend named  Dominica.  He has 7 children, many grands and great grands.  At home he watches a lot of television and loves car racing [employer], fashion/clothing).  As far as functional and nutritional status he has never been a big eater - frequently not finishing his plate.  For the last 30 days or so he has really starting having stomach pain and most recently vomiting.  Rustin has not driven for awhile.  He will ride along with someone else but does not do his own errands.  Helene Kelp states he is not clear enough.  When I asked about "clear" she explained his vision is distorted and he is not thinking clearly.  Further, Ashante was a heavy smoker and tried to avoid going to the doctors office.  We discussed his current illness and what it means in the larger context of his on-going co-morbidities.  Natural disease trajectory and expectations at EOL were discussed.  Helene Kelp understands that Kashtyn may not make it thru this illness, but the family needs to feel that if he dies they have tried everything to support him.  She goes on to explain that the children's mother was in  terrible shape 1 year ago (in ICU on the vent).  She required a trach.  Over time she was able to recover and no longer has the trach.  Last night she went to the [employer] and enjoyed it.  The children hope the same will be true for their father.  Helene Kelp acknowledges "Our sites are set high and the children are optimistic".  I attempted to elicit values and goals of care important to the patient.  We talked about Trach and PEG and living connected to machines.  Helene Kelp stated that [employer] and would not want to sit around connected to machines, but if there is a chance he could get thru it and recover in the next 6 months or so he would want to do it (trach / PEG).  Questions and concerns were addressed.  The family was encouraged to call with questions or concerns.       Primary Decision Maker:  Court Joy, back up HCPOA is Gordy Clement    SUMMARY OF RECOMMENDATIONS    Full code, full scope as long as there is a chance of a functional recovery family feels he would want trach/PEG/LTAC Family has an understanding that he may not survive this illness but if he dies they need to feel they have tried everything. PMT will follow along with you. Per family patient is always cold - please throw an extra blanket on him.  Code Status/Advance Care Planning:  Full Code/Full Scope   Symptom Management:   Per CCM  Additional Recommendations (Limitations, Scope, Preferences):  Full Scope Treatment  Palliative Prophylaxis:   Frequent Pain Assessment  Psycho-social/Spiritual:   Desire for further Chaplaincy support: Not discussed.  Prognosis: unfortunately poor prognosis given the need for increased ventilator support and extensive recent surgery in a gentleman with a fragile foundation prior to admission (Bladder cancer, urostomy, CVA, seizures, heavy tobacco use).    Discharge Planning: To Be Determined      Primary Diagnoses: Present on Admission: . SBO (small bowel obstruction) ([place]) . Hypernatremia . AKI (acute kidney injury) ([place]) . Lactic acidosis . HTN (hypertension)   I have reviewed the medical record, interviewed the patient and family, and examined the patient. The following aspects are pertinent.  Past Medical History:  Diagnosis Date  . Abdominal pain, other specified site 09/26/2013  . Anxiety   . Arthritis   . Atherosclerosis of arteries 05/13/2013  . Cough 09/26/2013  . Depression   . Eating disorder   . GERD (gastroesophageal reflux disease)   . Hepatitis C   . Hypertension   . Lesion of liver 04/01/2013  . Loss of weight 06/21/2014  . Medial knee pain 09/26/2013  . Palpitations 06/21/2014  . Shortness of breath dyspnea    with exertion   Social History   Socioeconomic History  . Marital status: Single    Spouse name: Not on file  . Number of children: 6  . Years of  education: 36  . Highest education level: Not on file  Occupational History  . Not on file  Tobacco Use  . Smoking status: Current Every Day Smoker    Packs/day: 2.00    Years: 60.00    Pack years: 120.00    Types: Cigarettes  . Smokeless tobacco: Never Used  Vaping Use  . Vaping Use: Never used  Substance and Sexual Activity  . Alcohol use: Yes    Alcohol/week: 0.0 standard drinks    Comment:  drinks  brandy daily  . Drug use: Yes    Types: Marijuana    Comment: "the legal kind"  . Sexual activity: Not on file  Other Topics Concern  . Not on file  Social History Narrative   Patient drinks caffeine occasionally.   Patient is right handed   Lives alone   Wears seat belts   Social Determinants of Health   Financial Resource Strain: Not on file  Food Insecurity: Not on file  Transportation Needs: Not on file  Physical Activity: Not on file  Stress: Not on file  Social Connections: Not on file   Family History  Problem Relation Age of Onset  . Diabetes Mother   . Arthritis Mother   . Healthy Sister        x 2  . Cancer Brother        stomach  . Dementia Father   . Prostate cancer Neg Hx   . Colon cancer Neg Hx   . Breast cancer Neg Hx   . Heart disease Neg Hx   . Hypertension Neg Hx     Allergies  Allergen Reactions  . Iohexol Hives     Desc: pt broke out in hives years ago from iv contrast. he was given 50mg  benadryl po, 1 hr prior to ct and did fine today w/o complications.  JB      Vital Signs: BP (!) 168/82   Pulse (!) 119   Temp 97.8 F (36.6 C) (Axillary)   Resp (!) 29   Ht 5\' 11"  (1.803 m)   Wt 66 kg   SpO2 97%   BMI 20.29 kg/m  Pain Scale: CPOT   Pain Score: 0-No pain   SpO2: SpO2: 97 % O2 Device:SpO2: 97 % O2 Flow Rate: .O2 Flow Rate (L/min): 4 L/min    Palliative Assessment/Data: 10%     Time In: 1:00 Time Out: 2:11  Time Total: 71 min. Visit consisted of counseling and education dealing with the complex and emotionally  intense issues surrounding the need for palliative care and symptom management in the setting of serious and potentially life-threatening illness. Greater than 50%  of this time was spent counseling and coordinating care related to the above assessment and plan.  Signed by: Florentina Jenny, PA-C Palliative Medicine  Please contact Palliative Medicine Team phone at [phone] for questions and concerns.  For individual provider: See Shea Evans

## 2020-11-28 NOTE — Progress Notes (Addendum)
NAME:  Ronald Wolf, MRN:  [id number], DOB:  1946/06/02, LOS: 34 ADMISSION DATE:  11/16/2020, CONSULTATION DATE:  11/21/2020 REFERRING MD: Cyndia Skeeters CHIEF COMPLAINT:  hypotension   Brief History   Ronald Wolf is a 74 yo M w/ PMH of CVA, Seizures, Dementia, Bladder ca s/p urostomy, tobacco use, right glottic carcinoma s/p excision presenting with sbo. Found to have ischemic bowel requiring ex-lap. PCCM consulted due to post-op hypotension / respiratory distress  History of present illness   Ronald Wolf is a 74 yo M w/ above PMH who initially presented to [facility] hospital on 11/11/20 with abdominal pain and nausea of 4-5 day duration. He was found to have SBO but left against medical advice as his family did not feel comfortable with him receiving his medical care in [place]. He came to [facility] ED the next day with similar complaint and was found to have SBO. He was treated with supportive care with bowel rest and ng suction. However his symptoms did not improve. Surgery team was consulted and he was brough to OR for exploratory laparoscopy. He was found to have multiple sections of ischemic bowel which were resected. He was unable to be extubated post-operatively and PCCM was consulted.  Past Medical History  CVA, Dementia, Bladder CA s/p nephrostomy, tobacco use, right glottic carcinoma s/p excision, seizures.  Significant Hospital Events   11/13/2020 Admission 12/01/2020 OR 11/25/2020 Admit to ICU 12/10 Brief SVT with associated hypertension. Self limited Given fentanyl, metop and hydral for HTN 12/11 Good urine output with Lasix , failed extubation, reintubated within 2 hours due to inability to clear secretions 12/12 return OR for enteric leak-- found to have bowel necrosis. Left in discontinuity  12/13 self extubated, reintubated. On pressors. Family meeting with PCCM and CCS to discuss possible next steps.  12/15-was back in OR, abdominal closure  Consults:  General  Surgery PCCM  Procedures:  11/15/2020 Ex-lap, G-tube placement ETT 12/7 >> 12/11, >> 12/15- back to OR for abdominal closure, exploratory laparotomy  Significant Diagnostic Tests:   CT abd/pelvis 12/2 > High-grade distal small bowel obstruction, possibly due to adhesion. Mild ascites and diffuse mesenteric edema. No evidence of focal inflammatory process or abscess. Small bilateral pleural effusions and mild right lower lobe Atelectasis.  Micro Data:  11/23/2020 COVID/Flu negative 12/7 BA: Few GNR rare GPC -- predominantly PMN  12/7 BCx> NGTD  12/11 respiratory -no organism in tracheal aspirate  Antimicrobials:  Fluc 12/8>> 12/10 Zosyn 12/8> 12/12  Interim history/subjective:   Did have a decompensation episode last evening and Required been placed on 100% FiO2 Now down to 60%  Objective   Blood pressure (!) 92/53, pulse 83, temperature 99.4 F (37.4 C), temperature source Axillary, resp. rate 14, height 5\' 11"  (1.803 m), weight 66 kg, SpO2 100 %.    Vent Mode: PRVC FiO2 (%):  [40 %-100 %] 60 % Set Rate:  [15 bmp] 15 bmp Vt Set:  [450 mL] 450 mL PEEP:  [5 cmH20-10 cmH20] 10 cmH20 Plateau Pressure:  [14 cmH20-18 cmH20] 18 cmH20   Intake/Output Summary (Last 24 hours) at 11/28/2020 0812 Last data filed at 11/28/2020 0600 Gross per 24 hour  Intake 2688.19 ml  Output 1955 ml  Net 733.19 ml   Filed Weights   11/25/2020 0500 11/27/20 0433 11/28/20 0500  Weight: 64.8 kg 65.6 kg 66 kg    Examination: Gen: Chronically ill-appearing, frail HEENT: Dry oral mucosa Lungs: Decreased air entry at the bases bilaterally CV:  S1S2 appreciated Abd: Multiple  ostomies GU:  RUQ urostomy Ext: Frail with decreased muscle mass Skin: Warm and dry Neuro: Awake, frail, not following commands  Resolved Hospital Problem list   N/A  Assessment & Plan:   Acute encephalopathy Multifactorial encephalopathy -Minimize sedation -RASS goal of 0 to -1  Acute hypoxic respiratory failure  required mechanical ventilation -Has been intubated 3 times -Not weaning and requiring escalation in respiratory support -Will require a tracheostomy  SBO Ischemic bowel perforation -Exploratory lap on 1215-viable bowel -As a higher ileostomy in place -Surgery continues to follow -Possible reinitiation of enteral nutrition today  Shock resolved  Acute kidney injury -Stabilizing  SVT A. fib with RVR Hypertension -Was on metoprolol, on hold because he had severe bradycardia into the 30s yesterday -No systemic anticoagulation  Protein calorie malnutrition -Currently on TPN -Possible initiation of enteric nutrition today  History of seizure -On Keppra  History of bladder cancer -Urostomy in place  Anemia -Continue to trend -Hematocrit did drop, will monitor  He will require tracheostomy -Still on significant PEEP at present  Goals of Care -poor baseline due to multiple medical problems, post emergent surgery with slow recovery, failed extubations (12/11, 12/13) due to general deconditioning and inability to clear secretions.  -12/13 Family discussion with CCS and PCCM. Family has decided to pursue all offered aggressive interventions (return OR for abd, trach, full code)  Best practice (evaluated daily)  Diet: TPN  Pain/Anxiety/Delirium protocol (if indicated): Fent, precedex VAP protocol (if indicated): Yes  DVT prophylaxis: SQH  GI prophylaxis: PPI Glucose control: SSI  Mobility:BR  Family updated -- Joint family meeting with CCS ans PCCM (Dr. Bobbye Morton, Dr. Grandville Silos, Dr. Fay Records) 12/13. Summary of conversation: Family is contemplating care trajectory moving forward (surgeries, trach, peg, etc).  12/14 Family has indicated desire for all offered aggressive interventions including return to OR for exlap, trach, long term TPN if indicated etc. Brother Ronald Wolf at bedside 12/15-- consent for OR not given overnight, questions from daughter and sister. Ronald Wolf  at bedside 12/17-Fiancee updated bedside Code Status: Full Disposition: ICU  Labs   CBC: Recent Labs  Lab 11/24/20 1154 11/25/20 0649 11/23/2020 1423 12/03/2020 2106 11/28/2020 2113 11/27/20 0810 11/28/20 0646  WBC 27.5* 34.6* 27.3*  --   --  33.0* 26.3*  NEUTROABS 22.3*  --   --   --   --   --   --   HGB 9.6* 9.2* 9.4* 8.8* 9.2* 9.3* 7.3*  HCT 32.1* 28.8* 31.0* 26.0* 27.0* 30.9* 24.7*  MCV 104.2* 101.4* 103.3*  --   --  104.0* 106.9*  PLT 103* 146* 188  --   --  230 485    Basic Metabolic Panel: Recent Labs  Lab 11/22/20 0647 11/22/20 1526 11/24/2020 1010 11/24/20 0842 11/24/20 1154 11/25/20 0649 11/25/20 2105 12/10/2020 1039 11/15/2020 2106 11/12/2020 2113 11/27/20 0810 11/28/20 0646  NA 140   < > 141   < > 142 143 140 141 142 143 143 145  K 3.6   < > 3.9   < > 5.6* 4.8 4.7 4.8 4.7 4.7 5.0 4.7  CL 107  --  105  --  108 108 108 108  --   --  110 110  CO2 20*  --  23  --  28 23 24 25   --   --  25 29  GLUCOSE 155*  --  163*  --  280* 154* 167* 334*  --   --  181* 189*  BUN 54*  --  57*  --  64* 60* 52* 51*  --   --  55* 63*  CREATININE 1.47*  --  1.38*  --  1.45* 1.39* 1.21 1.13  --   --  1.11 1.13  CALCIUM 7.1*  --  7.6*  --  7.6* 7.8* 7.9* 7.8*  --   --  8.2* 8.2*  MG 1.8  --  1.8  --  2.6* 2.3 2.3 2.4  --   --  2.2 2.2  PHOS 3.1  --  3.2  --  4.3  --   --   --   --   --  3.1  --    < > = values in this interval not displayed.   GFR: Estimated Creatinine Clearance: 53.5 mL/min (by C-G formula based on SCr of 1.13 mg/dL). Recent Labs  Lab 11/25/20 0649 11/15/2020 1423 11/27/20 0810 11/28/20 0646  WBC 34.6* 27.3* 33.0* 26.3*    Liver Function Tests: Recent Labs  Lab 11/28/2020 1010 11/24/20 1154 11/27/20 0810  AST 31 33 26  ALT 13 16 21   ALKPHOS 42 56 83  BILITOT 3.0* 1.7* 1.6*  PROT 4.6* 4.8* 4.7*  ALBUMIN 1.3* 1.3* 1.4*   No results for input(s): LIPASE, AMYLASE in the last 168 hours. No results for input(s): AMMONIA in the last 168 hours.  ABG     Component Value Date/Time   PHART 7.361 12/11/2020 2113   PCO2ART 47.6 11/17/2020 2113   PO2ART 41 (L) 11/20/2020 2113   HCO3 26.9 11/12/2020 2113   TCO2 28 12/12/2020 2113   ACIDBASEDEF 2.0 11/16/2020 1658   O2SAT 72.0 12/03/2020 2113     Coagulation Profile: No results for input(s): INR, PROTIME in the last 168 hours.  Cardiac Enzymes: No results for input(s): CKTOTAL, CKMB, CKMBINDEX, TROPONINI in the last 168 hours.  HbA1C: Hgb A1c MFr Bld  Date/Time Value Ref Range Status  11/19/2020 10:37 AM 5.0 4.8 - 5.6 % Final    Comment:    (NOTE) Pre diabetes:          5.7%-6.4%  Diabetes:              >6.4%  Glycemic control for   <7.0% adults with diabetes   12/17/2019 08:19 AM 4.5 (L) 4.8 - 5.6 % Final    Comment:    (NOTE) Pre diabetes:          5.7%-6.4% Diabetes:              >6.4% Glycemic control for   <7.0% adults with diabetes    The patient is critically ill with multiple organ systems failure and requires high complexity decision making for assessment and support, frequent evaluation and titration of therapies, application of advanced monitoring technologies and extensive interpretation of multiple databases. Critical Care Time devoted to patient care services described in this note independent of APP/resident time (if applicable)  is 32 minutes.   Sherrilyn Rist MD [facility] Personal pager: [phone] If unanswered, please page CCM On-call: [phone]

## 2020-11-29 DIAGNOSIS — J9600 Acute respiratory failure, unspecified whether with hypoxia or hypercapnia: Secondary | ICD-10-CM

## 2020-11-29 LAB — GLUCOSE, CAPILLARY
Glucose-Capillary: 143 mg/dL — ABNORMAL HIGH (ref 70–99)
Glucose-Capillary: 145 mg/dL — ABNORMAL HIGH (ref 70–99)
Glucose-Capillary: 154 mg/dL — ABNORMAL HIGH (ref 70–99)
Glucose-Capillary: 175 mg/dL — ABNORMAL HIGH (ref 70–99)
Glucose-Capillary: 177 mg/dL — ABNORMAL HIGH (ref 70–99)
Glucose-Capillary: 188 mg/dL — ABNORMAL HIGH (ref 70–99)

## 2020-11-29 LAB — BASIC METABOLIC PANEL
Anion gap: 7 (ref 5–15)
BUN: 63 mg/dL — ABNORMAL HIGH (ref 8–23)
CO2: 26 mmol/L (ref 22–32)
Calcium: 8.1 mg/dL — ABNORMAL LOW (ref 8.9–10.3)
Chloride: 113 mmol/L — ABNORMAL HIGH (ref 98–111)
Creatinine, Ser: 1.08 mg/dL (ref 0.61–1.24)
GFR, Estimated: >60 mL/min (ref >60)
Glucose, Bld: 192 mg/dL — ABNORMAL HIGH (ref 70–99)
Potassium: 4.1 mmol/L (ref 3.5–5.1)
Sodium: 146 mmol/L — ABNORMAL HIGH (ref 135–145)

## 2020-11-29 LAB — CBC
HCT: 26.1 % — ABNORMAL LOW (ref 39.0–52.0)
Hemoglobin: 8.2 g/dL — ABNORMAL LOW (ref 13.0–17.0)
MCH: 32.8 pg (ref 26.0–34.0)
MCHC: 31.4 g/dL (ref 30.0–36.0)
MCV: 104.4 fL — ABNORMAL HIGH (ref 80.0–100.0)
Platelets: 252 10*3/uL (ref 150–400)
RBC: 2.5 MIL/uL — ABNORMAL LOW (ref 4.22–5.81)
RDW: 15.7 % — ABNORMAL HIGH (ref 11.5–15.5)
WBC: 25.5 10*3/uL — ABNORMAL HIGH (ref 4.0–10.5)
nRBC: 0.4 % — ABNORMAL HIGH (ref 0.0–0.2)

## 2020-11-29 LAB — PHOSPHORUS: Phosphorus: 3 mg/dL (ref 2.5–4.6)

## 2020-11-29 LAB — MAGNESIUM: Magnesium: 2.2 mg/dL (ref 1.7–2.4)

## 2020-11-29 MED ORDER — ADULT MULTIVITAMIN W/MINERALS CH
1.0000 | ORAL_TABLET | Freq: Every day | ORAL | Status: DC
  Administered 2020-11-30 – 2020-12-03 (×4): 1
  Filled 2020-11-29 (×4): qty 1

## 2020-11-29 MED ORDER — METOPROLOL TARTRATE 25 MG PO TABS
12.5000 mg | ORAL_TABLET | Freq: Two times a day (BID) | ORAL | Status: DC
  Administered 2020-11-29 – 2020-12-03 (×6): 12.5 mg
  Filled 2020-11-29 (×7): qty 1

## 2020-11-29 MED ORDER — FUROSEMIDE 10 MG/ML IJ SOLN
40.0000 mg | Freq: Once | INTRAMUSCULAR | Status: AC
  Administered 2020-11-29: 11:00:00 40 mg via INTRAVENOUS
  Filled 2020-11-29: qty 4

## 2020-11-29 MED ORDER — FOLIC ACID 1 MG PO TABS
1.0000 mg | ORAL_TABLET | Freq: Every day | ORAL | Status: DC
  Administered 2020-11-30 – 2020-12-03 (×4): 1 mg
  Filled 2020-11-29 (×4): qty 1

## 2020-11-29 NOTE — Progress Notes (Signed)
PHARMACY - TOTAL PARENTERAL NUTRITION CONSULT NOTE  Indication: Small bowel obstruction, prolonged ileus  Patient Measurements: Height: 5\' 11"  (180.3 cm) Weight: 61.2 kg (134 lb 14.7 oz) IBW/kg (Calculated) : 75.3 TPN AdjBW (KG): 60.8 Body mass index is 18.82 kg/m. Usual Weight: 137 lbs  Assessment:  74 YOM presented 12/07/2020 with nausea, vomiting, abdominal pain after leaving AMA at [facility] with known SBO. Patient has been on bowel rest with NG tube decompression. Patient continued to have persistent SBO on imaging with no evidence of improvement, now s/p ex-lap with LoA, SBR and placement of Gtube on 11/12/2020. Pharmacy consulted for TPN management.  Expected prolonged ileus.  Patient remains in ICU, extubated and then re-intubated x3, sedated on Fentanyl PRN + Precedex.  Glucose / Insulin: no hx DM - CBGs <180, used 9 units SSI yesterday Electrolytes: Na/CL slightly high, others WNL Renal: SCr 1.08, BUN 63 LFTs / TGs: LFTs / TG WNL, tbili down 1.6 Prealbumin / albumin: BL prealbumin low 6.7>5.5, albumin 1.4 Intake / Output; MIVF: UOP 1.7 ml/kg/hr with Lasix 40mg  IV x1, gastrostomy 145ml, stool 2335mL, net +10.7L (down), LBM 12/11 GI Imaging:  12/11 DG Abd - stomach gas-filled, without distention, no obvious free air Surgeries / Procedures:  12/12: re-exploration and washout with new copious drainage from incision concerning for enteric leak vs. enterotomy >> now s/p, bowel necrosis resected 12/15: ex-lap with ileostomy with fistula creation, abdominal closure  Central access: PICC placed 11/22/2020 TPN start date: 11/19/20   Nutritional Goals (per RD rec 12/13): 2100-2300 kCal, 120-135g protein, >2L fluid per day  Current Nutrition:  TPN TF  Plan:  Increase Pivot 1.5 to 50 ml/hr at 1000 and then to goal rate of 60 ml/hr at 1800 today per order  Reduce TPN to 45 ml/hr and then stop at 1800 Folate 1mg  and multivitamin PT daily D/C TPN and nursing care orders  Ronald Wolf D. Mina Marble,  PharmD, BCPS, [place] 11/29/2020, 10:06 AM

## 2020-11-29 NOTE — Progress Notes (Signed)
NAME:  Ronald Wolf, MRN:  [id number], DOB:  1946/06/27, LOS: 36 ADMISSION DATE:  12/07/2020, CONSULTATION DATE:  11/13/2020 REFERRING MD: Cyndia Skeeters CHIEF COMPLAINT:  hypotension   Brief History   Mr.Ronald Wolf is a 74 yo M w/ PMH of CVA, Seizures, Dementia, Bladder ca s/p urostomy, tobacco use, right glottic carcinoma s/p excision presenting with sbo. Found to have ischemic bowel requiring ex-lap. PCCM consulted due to post-op hypotension / respiratory distress  History of present illness   Mr.Ronald Wolf is a 74 yo M w/ above PMH who initially presented to [facility] hospital on 11/11/20 with abdominal pain and nausea of 4-5 day duration. He was found to have SBO but left against medical advice as his family did not feel comfortable with him receiving his medical care in [place]. He came to [facility] ED the next day with similar complaint and was found to have SBO. He was treated with supportive care with bowel rest and ng suction. However his symptoms did not improve. Surgery team was consulted and he was brough to OR for exploratory laparoscopy. He was found to have multiple sections of ischemic bowel which were resected. He was unable to be extubated post-operatively and PCCM was consulted.  Past Medical History  CVA, Dementia, Bladder CA s/p nephrostomy, tobacco use, right glottic carcinoma s/p excision, seizures.  Significant Hospital Events   12/06/2020 Admission 12/01/2020 OR 11/17/2020 Admit to ICU 12/10 Brief SVT with associated hypertension. Self limited Given fentanyl, metop and hydral for HTN 12/11 Good urine output with Lasix , failed extubation, reintubated within 2 hours due to inability to clear secretions 12/12 return OR for enteric leak-- found to have bowel necrosis. Left in discontinuity  12/13 self extubated, reintubated. On pressors. Family meeting with PCCM and CCS to discuss possible next steps.  12/15-was back in OR, abdominal closure  Consults:  General  Surgery PCCM  Procedures:  12/01/2020 Ex-lap, G-tube placement ETT 12/7 >> 12/11, >> 12/15- back to OR for abdominal closure, exploratory laparotomy  Significant Diagnostic Tests:   CT abd/pelvis 12/2 > High-grade distal small bowel obstruction, possibly due to adhesion. Mild ascites and diffuse mesenteric edema. No evidence of focal inflammatory process or abscess. Small bilateral pleural effusions and mild right lower lobe Atelectasis.  Micro Data:  12/07/2020 COVID/Flu negative 12/7 BA: Few GNR rare GPC -- predominantly PMN  12/7 BCx> NGTD  12/11 respiratory -no organism in tracheal aspirate  Antimicrobials:  Fluc 12/8>> 12/10 Zosyn 12/8> 12/12  Interim history/subjective:   Did have a decompensation episode 12/17 Required been placed on 100% FiO2 Now down to 60% No overnight events reported  Objective   Blood pressure (!) 157/66, pulse 93, temperature 98.7 F (37.1 C), temperature source Axillary, resp. rate (!) 29, height 5\' 11"  (1.803 m), weight 65.6 kg, SpO2 97 %.    Vent Mode: PRVC FiO2 (%):  [40 %-60 %] 40 % Set Rate:  [15 bmp] 15 bmp Vt Set:  [450 mL] 450 mL PEEP:  [5 cmH20] 5 cmH20 Plateau Pressure:  [16 cmH20-23 cmH20] 16 cmH20   Intake/Output Summary (Last 24 hours) at 11/29/2020 0941 Last data filed at 11/29/2020 0900 Gross per 24 hour  Intake 3466.16 ml  Output 5700 ml  Net -2233.84 ml   Filed Weights   11/27/20 0433 11/28/20 0500 11/29/20 0500  Weight: 65.6 kg 66 kg 65.6 kg    Examination: Gen: Chronically ill-appearing, very frail HEENT: Dry oral mucosa, endotracheal tube in place Lungs: Decreased air entry bilaterally CV:  S1S2 appreciated Abd: Multiple ostomies GU:  RUQ urostomy Ext: Frail with decreased muscle mass Skin: Warm and dry Neuro: Awake, frail, not following commands  Resolved Hospital Problem list   N/A  Assessment & Plan:   Multifactorial encephalopathy -Minimize sedation RASS goal of 0 to -1  Acute hypoxemic  respiratory failure required during mechanical ventilation Has been intubated 3 times during this hospitalization Not weaning and requiring escalation in respiratory support He will require tracheostomy  SBO Ischemic bowel perforation Exploratory lap on 12/15 revealed viable bowel -Has a high ileostomy in place Surgery continues to follow Enteric nutrition initiated on 12/17 -Significant effluent from her ileostomy  Acute kidney injury -Stabilizing  Pleural effusions -Secondary to malnutrition and low albumin -Cautious diuresis  SVT A. fib with RVR Hypertension -On low-dose metoprolol -Not on anticoagulation-unsafe  Severe protein calorie malnutrition -Continue TPN -Enteric nutrition  History of seizure -On Keppra  History of bladder cancer  Anemia -Continue to trend -Transfusion per protocol   Goals of Care -poor baseline due to multiple medical problems, post emergent surgery with slow recovery, failed extubations (12/11, 12/13) due to general deconditioning and inability to clear secretions.  -12/13 Family discussion with CCS and PCCM. Family has decided to pursue all offered aggressive interventions (return OR for abd, trach, full code)  He remains full code Appreciate palliative care involvement Prognosis is guarded, risk of decompensation remains significant  Best practice (evaluated daily)  Diet: TPN  Pain/Anxiety/Delirium protocol (if indicated): Fent, precedex VAP protocol (if indicated): Yes  DVT prophylaxis: SQH  GI prophylaxis: PPI Glucose control: SSI  Mobility:BR  Family updated -- Joint family meeting with CCS ans PCCM (Dr. Bobbye Morton, Dr. Grandville Silos, Dr. Fay Records) 12/13. Summary of conversation: Family is contemplating care trajectory moving forward (surgeries, trach, peg, etc).  12/14 Family has indicated desire for all offered aggressive interventions including return to OR for exlap, trach, long term TPN if indicated etc. Brother [name] at  bedside 12/15-- consent for OR not given overnight, questions from daughter and sister. [name] at bedside 12/17-Fiancee updated bedside Code Status: Full Disposition: ICU  Labs   CBC: Recent Labs  Lab 11/24/20 1154 11/25/20 0649 11/28/2020 1423 11/22/2020 2106 11/17/2020 2113 11/27/20 0810 11/28/20 0646 11/29/20 0452  WBC 27.5* 34.6* 27.3*  --   --  33.0* 26.3* 25.5*  NEUTROABS 22.3*  --   --   --   --   --   --   --   HGB 9.6* 9.2* 9.4* 8.8* 9.2* 9.3* 7.3* 8.2*  HCT 32.1* 28.8* 31.0* 26.0* 27.0* 30.9* 24.7* 26.1*  MCV 104.2* 101.4* 103.3*  --   --  104.0* 106.9* 104.4*  PLT 103* 146* 188  --   --  230 208 694    Basic Metabolic Panel: Recent Labs  Lab 11/16/2020 1010 11/24/20 0842 11/24/20 1154 11/25/20 0649 11/25/20 2105 11/19/2020 1039 11/18/2020 2106 12/01/2020 2113 11/27/20 0810 11/28/20 0646 11/29/20 0452  NA 141   < > 142   < > 140 141 142 143 143 145 146*  K 3.9   < > 5.6*   < > 4.7 4.8 4.7 4.7 5.0 4.7 4.1  CL 105  --  108   < > 108 108  --   --  110 110 113*  CO2 23  --  28   < > 24 25  --   --  25 29 26   GLUCOSE 163*  --  280*   < > 167* 334*  --   --  181* 189* 192*  BUN 57*  --  64*   < > 52* 51*  --   --  55* 63* 63*  CREATININE 1.38*  --  1.45*   < > 1.21 1.13  --   --  1.11 1.13 1.08  CALCIUM 7.6*  --  7.6*   < > 7.9* 7.8*  --   --  8.2* 8.2* 8.1*  MG 1.8  --  2.6*   < > 2.3 2.4  --   --  2.2 2.2 2.2  PHOS 3.2  --  4.3  --   --   --   --   --  3.1  --  3.0   < > = values in this interval not displayed.   GFR: Estimated Creatinine Clearance: 55.7 mL/min (by C-G formula based on SCr of 1.08 mg/dL). Recent Labs  Lab 11/22/2020 1423 11/27/20 0810 11/28/20 0646 11/29/20 0452  WBC 27.3* 33.0* 26.3* 25.5*    Liver Function Tests: Recent Labs  Lab 11/22/2020 1010 11/24/20 1154 11/27/20 0810  AST 31 33 26  ALT 13 16 21   ALKPHOS 42 56 83  BILITOT 3.0* 1.7* 1.6*  PROT 4.6* 4.8* 4.7*  ALBUMIN 1.3* 1.3* 1.4*   No results for input(s): LIPASE,  AMYLASE in the last 168 hours. No results for input(s): AMMONIA in the last 168 hours.  ABG    Component Value Date/Time   PHART 7.361 11/29/2020 2113   PCO2ART 47.6 12/04/2020 2113   PO2ART 41 (L) 12/10/2020 2113   HCO3 26.9 11/30/2020 2113   TCO2 28 11/28/2020 2113   ACIDBASEDEF 2.0 12/05/2020 1658   O2SAT 72.0 11/26/2020 2113     Coagulation Profile: No results for input(s): INR, PROTIME in the last 168 hours.  Cardiac Enzymes: No results for input(s): CKTOTAL, CKMB, CKMBINDEX, TROPONINI in the last 168 hours.  HbA1C: Hgb A1c MFr Bld  Date/Time Value Ref Range Status  11/19/2020 10:37 AM 5.0 4.8 - 5.6 % Final    Comment:    (NOTE) Pre diabetes:          5.7%-6.4%  Diabetes:              >6.4%  Glycemic control for   <7.0% adults with diabetes   12/17/2019 08:19 AM 4.5 (L) 4.8 - 5.6 % Final    Comment:    (NOTE) Pre diabetes:          5.7%-6.4% Diabetes:              >6.4% Glycemic control for   <7.0% adults with diabetes    The patient is critically ill with multiple organ systems failure and requires high complexity decision making for assessment and support, frequent evaluation and titration of therapies, application of advanced monitoring technologies and extensive interpretation of multiple databases. Critical Care Time devoted to patient care services described in this note independent of APP/resident time (if applicable)  is 35 minutes.   Sherrilyn Rist MD [facility] Personal pager: [phone] If unanswered, please page CCM On-call: [phone]

## 2020-11-29 NOTE — Progress Notes (Signed)
Progress Note: General Surgery Service   Chief Complaint/Subjective: No events overnight, tolerating tube feeds  Objective: Vital signs in last 24 hours: Temp:  [96.9 F (36.1 C)-99.9 F (37.7 C)] 98.7 F (37.1 C) (12/18 0753) Pulse Rate:  [92-120] 99 (12/18 1000) Resp:  [18-34] 21 (12/18 1000) BP: (114-180)/(49-82) 149/69 (12/18 1000) SpO2:  [97 %-100 %] 98 % (12/18 1000) FiO2 (%):  [40 %-60 %] 40 % (12/18 0832) Weight:  [65.6 kg] 65.6 kg (12/18 0500) Last BM Date: 11/29/20  Intake/Output from previous day: 12/17 0701 - 12/18 0700 In: 3084.9 [I.V.:2528.9; NG/GT:340; IV Piggyback:216] Out: 5050 [Urine:2600; Drains:100; Stool:2350] Intake/Output this shift: Total I/O In: 381.2 [I.V.:301.2; NG/GT:80] Out: 1400 [Urine:550; Stool:850]  Gen: intubated, arousable  Resp: assisted  Card: RRR  Abd: soft, RUQ ostomy purple, LUQ ostomy pink with large liquid output  Lab Results: CBC  Recent Labs    11/28/20 0646 11/29/20 0452  WBC 26.3* 25.5*  HGB 7.3* 8.2*  HCT 24.7* 26.1*  PLT 208 252   BMET Recent Labs    11/28/20 0646 11/29/20 0452  NA 145 146*  K 4.7 4.1  CL 110 113*  CO2 29 26  GLUCOSE 189* 192*  BUN 63* 63*  CREATININE 1.13 1.08  CALCIUM 8.2* 8.1*   PT/INR No results for input(s): LABPROT, INR in the last 72 hours. ABG Recent Labs    12/03/2020 2106 11/17/2020 2113  PHART 7.334* 7.361  HCO3 28.0 26.9    Anti-infectives: Anti-infectives (From admission, onward)   Start     Dose/Rate Route Frequency Ordered Stop   12/04/2020 0945  cefoTEtan (CEFOTAN) 2 g in sodium chloride 0.9 % 100 mL IVPB  Status:  Discontinued        2 g 200 mL/hr over 30 Minutes Intravenous To Surgery 12/12/2020 0936 11/20/2020 1057   12/05/2020 0900  cefoTEtan (CEFOTAN) 2 g in sodium chloride 0.9 % 100 mL IVPB  Status:  Discontinued        2 g 200 mL/hr over 30 Minutes Intravenous  Once 11/14/2020 0833 11/17/2020 0936   11/19/20 1130  piperacillin-tazobactam (ZOSYN) IVPB 3.375 g         3.375 g 12.5 mL/hr over 240 Minutes Intravenous Every 8 hours 11/19/20 1017 12/07/2020 2359   11/19/20 1115  fluconazole (DIFLUCAN) IVPB 200 mg        200 mg 100 mL/hr over 60 Minutes Intravenous Every 24 hours 11/19/20 1017 11/21/20 1104   11/19/20 0915  Ampicillin-Sulbactam (UNASYN) 3 g in sodium chloride 0.9 % 100 mL IVPB  Status:  Discontinued        3 g 200 mL/hr over 30 Minutes Intravenous Every 12 hours 11/19/20 0826 11/19/20 0949      Medications: Scheduled Meds: . chlorhexidine gluconate (MEDLINE KIT)  15 mL Mouth Rinse BID  . Chlorhexidine Gluconate Cloth  6 each Topical Daily  . feeding supplement (PROSource TF)  45 mL Per Tube BID  . [START ON 93/71/6967] folic acid  1 mg Per Tube Daily  . heparin injection (subcutaneous)  5,000 Units Subcutaneous Q8H  . insulin aspart  0-9 Units Subcutaneous Q4H  . mouth rinse  15 mL Mouth Rinse 10 times per day  . metoprolol tartrate  12.5 mg Per Tube BID  . [START ON 11/30/2020] multivitamin with minerals  1 tablet Per Tube Daily  . pantoprazole (PROTONIX) IV  40 mg Intravenous QHS  . sodium chloride flush  10-40 mL Intracatheter Q12H   Continuous Infusions: . sodium chloride 10  mL/hr at 11/14/2020 1052  . sodium chloride Stopped (12/12/2020 1358)  . dexmedetomidine (PRECEDEX) IV infusion 0.4 mcg/kg/hr (11/29/20 0900)  . feeding supplement (PIVOT 1.5 CAL) 20 mL/hr at 11/28/20 1200  . fentaNYL infusion INTRAVENOUS Stopped (11/29/20 0744)  . levETIRAcetam 750 mg (11/29/20 1043)  . TPN ADULT (ION) 90 mL/hr at 11/29/20 0900   PRN Meds:.acetaminophen, fentaNYL, fentaNYL (SUBLIMAZE) injection, hydrALAZINE, ondansetron (ZOFRAN) IV, sodium chloride flush  Assessment/Plan: s/p Procedure(s): EXPLORATORY LAPAROTOMY AND ABDOMINAL CLOSURE ILEOSTOMY WITH MUCOUS FISTULA CREATION 11/25/2020  SBO -s/p ex lap with SBR, placement of a gastrostomy tube12/7 by Dr. Georgette Dover. Takeback 12/12 by Dr. Zenia Resides for necrotic SB with resection of additional 60-70cm  of SB inclusive of prior anastomosis in discontinuity with open abdomen. - S/P ex lap, ileostomy, mucous fistula, closure 12/15 by Dr. Grandville Silos Malnourishment - continued clinical decline in a patient who was previously quite deconditioned. Lengthy goals of care discussion with family are ongoing VDRF- self-extubated 12/13 AM, re-intubated ~3h after. Second time failing extubation, desat overnight, PEEP 8, per CCM, CXR now. Likely would need trach to come off the vent. On FiO2 60% and PEEP 10 so cannot trach yet. Unsure if family will want that as well. Shock/ID- likely septic, completed 5d zosyn, necrotic bowel was most likely source. Pressors back off CV/AF RVR - back in NSR FEN-advance TF via G tube, stop TPN VTE-SQH Dispo - ICU   LOS: 17 days   Mickeal Skinner, MD [facility]

## 2020-11-29 NOTE — Progress Notes (Signed)
Daily Progress Note   Patient Name: Ronald Wolf       Date: 11/29/2020 DOB: Sep 26, 1946  Age: 74 y.o. MRN#: [id number] Attending Physician: Laurin Coder, MD Primary Care Physician: Mosie Lukes, MD Admit Date: 11/14/2020  Reason for Consultation/Follow-up: To discuss complex medical decision making related to patient's goals of care  Received a call from Ronald Wolf, Ronald Wolf asking for an update. Per Wolf patient is tolerating tube feeds at 50 ml/hour but having high ileostomy output.  Question what he is absorbing.   When sedation is turned down patient groans and becomes agitated. Touched base with Dr. Ander Slade on patient's condition.  Subjective: Spoke with Ronald Wolf on the phone.  Expressed my concern that Ronald Wolf will not be able to rehab and will have poor quality of life.  I feel that he will likely be bedbound and require some type of artificial support for the rest of his life.  Ronald Wolf asked me if I would recommend against him having a trach.  I replied that I would absolutely recommend against him having a trach.  She expressed that she understood when Palliative was involved that things must not be going well.  She plans to pass this information to the rest of the family this evening.  Assessment: Patient stable, but with high ileostomy output and requiring sedation to prevent agitation and pain.   Patient Profile/HPI:   74 y.o. male  with past medical history of bladder cancer s/p urostomy, seizure, disorder, right glottic cancer s/p excision, atrial fibrillation, tobacco abuse, CVA, hepatitis C, anxiety and depression who was admitted on 12/04/2020 with small bowel obstruction.  He had left AMA from [facility].  Initially he was treated with conservative measures but  on 12/7 he underwent exploratory laproscopy and part of his small bowel was resected. He was taken back to the OR on 12/12 for further bowel resection.   On 12/15 he had abdominal closure and ileostomy placement.  During this time he has suffered from respiratory failure and been intubated 3 times.   Currently he is not weaning from the vent and vent settings are too high to allow for tracheostomy placement.  Patient is quite cachectic and remains on TPN.  He opens his eyes to voice and touch but does not respond to me otherwise.  Length of Stay: 17   Vital Signs: BP (!) 143/70   Pulse (!) 112   Temp (!) 100.5 F (38.1 C) (Axillary)   Resp (!) 27   Ht 5\' 11"  (1.803 m)   Wt 65.6 kg   SpO2 99%   BMI 20.17 kg/m  SpO2: SpO2: 99 % O2 Device: O2 Device: Ventilator O2 Flow Rate: O2 Flow Rate (L/min): 4 L/min       Palliative Assessment/Data: 10%     Palliative Care Plan    Recommendations/Plan: PMT will continue to follow with you and support the family.  Code Status:  Full code  Prognosis:  Unable to determine.  Even with full support patient is at high risk for acute decline, rehospitalization or death.   Discharge Planning: To Be Determined  Care plan was discussed with Ronald Wolf, Patient's family.  Thank you for allowing the Palliative Medicine Team to assist in the care of this patient.  Total time spent:  45 min.     Greater than 50%  of this time was spent counseling and coordinating care related to the above assessment and plan.  Florentina Jenny, PA-C Palliative Medicine  Please contact Palliative MedicineTeam phone at [phone] for questions and concerns between 7 am - 7 pm.   Please see AMION for individual provider pager numbers.

## 2020-11-30 ENCOUNTER — Inpatient Hospital Stay (HOSPITAL_COMMUNITY): Payer: Medicare PPO

## 2020-11-30 LAB — CBC WITH DIFFERENTIAL/PLATELET
Abs Immature Granulocytes: 0 10*3/uL (ref 0.00–0.07)
Basophils Absolute: 0 10*3/uL (ref 0.0–0.1)
Basophils Relative: 0 %
Eosinophils Absolute: 0.3 10*3/uL (ref 0.0–0.5)
Eosinophils Relative: 1 %
HCT: 25.7 % — ABNORMAL LOW (ref 39.0–52.0)
Hemoglobin: 7.4 g/dL — ABNORMAL LOW (ref 13.0–17.0)
Lymphocytes Relative: 6 %
Lymphs Abs: 1.7 10*3/uL (ref 0.7–4.0)
MCH: 31.6 pg (ref 26.0–34.0)
MCHC: 28.8 g/dL — ABNORMAL LOW (ref 30.0–36.0)
MCV: 109.8 fL — ABNORMAL HIGH (ref 80.0–100.0)
Monocytes Absolute: 0.6 10*3/uL (ref 0.1–1.0)
Monocytes Relative: 2 %
Neutro Abs: 25.2 10*3/uL — ABNORMAL HIGH (ref 1.7–7.7)
Neutrophils Relative %: 91 %
Platelets: 292 10*3/uL (ref 150–400)
RBC: 2.34 MIL/uL — ABNORMAL LOW (ref 4.22–5.81)
RDW: 16.3 % — ABNORMAL HIGH (ref 11.5–15.5)
WBC: 27.7 10*3/uL — ABNORMAL HIGH (ref 4.0–10.5)
nRBC: 0 /100 WBC
nRBC: 0.2 % (ref 0.0–0.2)

## 2020-11-30 LAB — BASIC METABOLIC PANEL
Anion gap: 10 (ref 5–15)
BUN: 67 mg/dL — ABNORMAL HIGH (ref 8–23)
CO2: 22 mmol/L (ref 22–32)
Calcium: 8.3 mg/dL — ABNORMAL LOW (ref 8.9–10.3)
Chloride: 118 mmol/L — ABNORMAL HIGH (ref 98–111)
Creatinine, Ser: 1.23 mg/dL (ref 0.61–1.24)
GFR, Estimated: >60 mL/min (ref >60)
Glucose, Bld: 127 mg/dL — ABNORMAL HIGH (ref 70–99)
Potassium: 3.9 mmol/L (ref 3.5–5.1)
Sodium: 150 mmol/L — ABNORMAL HIGH (ref 135–145)

## 2020-11-30 LAB — GLUCOSE, CAPILLARY
Glucose-Capillary: 104 mg/dL — ABNORMAL HIGH (ref 70–99)
Glucose-Capillary: 106 mg/dL — ABNORMAL HIGH (ref 70–99)
Glucose-Capillary: 113 mg/dL — ABNORMAL HIGH (ref 70–99)
Glucose-Capillary: 117 mg/dL — ABNORMAL HIGH (ref 70–99)
Glucose-Capillary: 121 mg/dL — ABNORMAL HIGH (ref 70–99)
Glucose-Capillary: 146 mg/dL — ABNORMAL HIGH (ref 70–99)
Glucose-Capillary: 93 mg/dL (ref 70–99)

## 2020-11-30 MED ORDER — LOPERAMIDE HCL 1 MG/7.5ML PO SUSP
2.0000 mg | Freq: Three times a day (TID) | ORAL | Status: DC
  Administered 2020-11-30 – 2020-12-01 (×3): 2 mg
  Filled 2020-11-30 (×4): qty 15

## 2020-11-30 MED ORDER — PANTOPRAZOLE SODIUM 40 MG PO PACK
40.0000 mg | PACK | Freq: Every day | ORAL | Status: DC
  Administered 2020-11-30 – 2021-01-18 (×48): 40 mg
  Filled 2020-11-30 (×48): qty 20

## 2020-11-30 MED ORDER — DEXTROSE 5 % IV BOLUS
250.0000 mL | Freq: Once | INTRAVENOUS | Status: AC
  Administered 2020-11-30: 14:00:00 250 mL via INTRAVENOUS

## 2020-11-30 MED ORDER — NUTRISOURCE FIBER PO PACK
1.0000 | PACK | Freq: Every day | ORAL | Status: DC
  Administered 2020-11-30 – 2020-12-02 (×3): 1
  Filled 2020-11-30 (×3): qty 1

## 2020-11-30 MED ORDER — FREE WATER
100.0000 mL | Freq: Three times a day (TID) | Status: DC
  Administered 2020-11-30 – 2020-12-01 (×4): 100 mL

## 2020-11-30 MED ORDER — CHLORHEXIDINE GLUCONATE CLOTH 2 % EX PADS
6.0000 | MEDICATED_PAD | Freq: Every day | CUTANEOUS | Status: DC
  Administered 2020-12-01 – 2020-12-27 (×28): 6 via TOPICAL

## 2020-11-30 NOTE — Progress Notes (Addendum)
NAME:  Ronald Wolf, MRN:  [id number], DOB:  [birth year], LOS: 83 ADMISSION DATE:  12/07/2020, CONSULTATION DATE:  11/12/2020 REFERRING MD: Cyndia Skeeters CHIEF COMPLAINT:  hypotension   Brief History   Mr.Odor is a 74 yo M w/ PMH of CVA, Seizures, Dementia, Bladder ca s/p urostomy, tobacco use, right glottic carcinoma s/p excision presenting with sbo. Found to have ischemic bowel requiring ex-lap. PCCM consulted due to post-op hypotension / respiratory distress  History of present illness   Mr.Lias is a 74 yo M w/ above PMH who initially presented to [facility] hospital on 11/11/20 with abdominal pain and nausea of 4-5 day duration. He was found to have SBO but left against medical advice as his family did not feel comfortable with him receiving his medical care in [place]. He came to [facility] ED the next day with similar complaint and was found to have SBO. He was treated with supportive care with bowel rest and ng suction. However his symptoms did not improve. Surgery team was consulted and he was brough to OR for exploratory laparoscopy. He was found to have multiple sections of ischemic bowel which were resected. He was unable to be extubated post-operatively and PCCM was consulted.  Past Medical History  CVA, Dementia, Bladder CA s/p nephrostomy, tobacco use, right glottic carcinoma s/p excision, seizures.  Significant Hospital Events   12/09/2020 Admission 12/08/2020 OR 11/27/2020 Admit to ICU 12/10 Brief SVT with associated hypertension. Self limited Given fentanyl, metop and hydral for HTN 12/11 Good urine output with Lasix , failed extubation, reintubated within 2 hours due to inability to clear secretions 12/12 return OR for enteric leak-- found to have bowel necrosis. Left in discontinuity  12/13 self extubated, reintubated. On pressors. Family meeting with PCCM and CCS to discuss possible next steps.  12/15-was back in OR, abdominal closure\ 12/19: No significant overnight events, high  ileostomy output   Consults:  General Surgery PCCM  Procedures:  12/12/2020 Ex-lap, G-tube placement ETT 12/7 >> 12/11, >> 12/15- back to OR for abdominal closure, exploratory laparotomy  Significant Diagnostic Tests:   CT abd/pelvis 12/2 > High-grade distal small bowel obstruction, possibly due to adhesion. Mild ascites and diffuse mesenteric edema. No evidence of focal inflammatory process or abscess. Small bilateral pleural effusions and mild right lower lobe Atelectasis.  Micro Data:  11/23/2020 COVID/Flu negative 12/7 BA: Few GNR rare GPC -- predominantly PMN  12/7 BCx> NGTD  12/11 respiratory -no organism in tracheal aspirate  Antimicrobials:  Fluc 12/8>> 12/10 Zosyn 12/8> 12/12  Interim history/subjective:   Stable overnight FiO2 did come down to 30%  Objective   Blood pressure (!) 85/57, pulse 75, temperature (!) 97.3 F (36.3 C), temperature source Axillary, resp. rate (!) 24, height 5\' 11"  (1.803 m), weight 65.2 kg, SpO2 100 %.    Vent Mode: PRVC FiO2 (%):  [30 %-40 %] 30 % Set Rate:  [15 bmp] 15 bmp Vt Set:  [450 mL] 450 mL PEEP:  [5 cmH20] 5 cmH20 Plateau Pressure:  [13 cmH20-16 cmH20] 16 cmH20   Intake/Output Summary (Last 24 hours) at 11/30/2020 0354 Last data filed at 11/30/2020 0800 Gross per 24 hour  Intake 2618.25 ml  Output 5200 ml  Net -2581.75 ml   Filed Weights   11/28/20 0500 11/29/20 0500 11/30/20 0300  Weight: 66 kg 65.6 kg 65.2 kg    Examination: Gen: Chronically ill-appearing, very frail, does open eyes to name calling HEENT: Dry oral mucosa endotracheal tube in place Lungs: Decreased air  entry bilaterally CV: S1-S2 appreciated Abd: Multiple ostomies GU: Urostomy in place Ext: Frail with decreased muscle mass Skin: Warm and dry Neuro: Awake, frail, will open his eyes to name calling  Chest x-ray is improved-reviewed by myself, improved effusions  Resolved Hospital Problem list   N/A  Assessment & Plan:   Multifactorial  encephalopathy -Minimize sedation -RASS goal of 0-1  Acute hypoxic respiratory failure requiring mechanical ventilation Has been intubated x3 during this hospitalization -Has not been weaning but will attempt pressure support today -He will require tracheostomy  SBO Ischemic bowel perforation Exposure laparotomy on 12/15 revealed viable bowel -high ileostomy high output -Surgery continues to follow -Enteric nutrition started on 12/17  SBO Ischemic bowel perforation Exploratory lap on 12/15 revealed viable bowel -Has a high ileostomy in place Surgery continues to follow Enteric nutrition initiated on 12/17 -Significant effluent from ileostomy  Acute kidney injury -Stabilizing  Hypernatremia -250 cc of D5 water -Free water 100 cc q. 8  Pleural effusions -Secondary to malnutrition and low albumin -Cautious diuresis  SVT Atrial fibrillation with RVR Hypertension -On low-dose metoprolol -Not a safe candidate for anticoagulation  Severe protein calorie malnutrition Enteric nutrition  History of seizure -On Keppra  History of bladder cancer  Anemia -Transfuse per protocol  Goals of Care -poor baseline due to multiple medical problems, post emergent surgery with slow recovery, failed extubations (12/11, 12/13) due to general deconditioning and inability to clear secretions.  -12/13 Family discussion with CCS and PCCM. Family has decided to pursue all offered aggressive interventions (return OR for abd, trach, full code)  Remains a full code Appreciate palliative care involvement Prognosis is guarded, high risk of decompensation continues to be very high  Best practice (evaluated daily)  Diet: Enteric nutrition Pain/Anxiety/Delirium protocol (if indicated): Fent, precedex VAP protocol (if indicated): Yes  DVT prophylaxis: SQH  GI prophylaxis: PPI Glucose control: SSI  Mobility:BR  Family updated -- Joint family meeting with CCS ans PCCM (Dr. Bobbye Morton, Dr.  Grandville Silos, Dr. Fay Records) 12/13. Summary of conversation: Family is contemplating care trajectory moving forward (surgeries, trach, peg, etc).  12/14 Family has indicated desire for all offered aggressive interventions including return to OR for exlap, trach, long term TPN if indicated etc. Brother [name] at bedside 12/15-- consent for OR not given overnight, questions from daughter and sister. [name] at bedside 12/17,12/19-Fiancee updated bedside Code Status: Full Disposition: ICU  Labs   CBC: Recent Labs  Lab 11/24/20 1154 11/25/20 0649 12/09/2020 1423 11/27/2020 2106 11/17/2020 2113 11/27/20 0810 11/28/20 0646 11/29/20 0452 11/30/20 0419  WBC 27.5*   < > 27.3*  --   --  33.0* 26.3* 25.5* 27.7*  NEUTROABS 22.3*  --   --   --   --   --   --   --  25.2*  HGB 9.6*   < > 9.4*   < > 9.2* 9.3* 7.3* 8.2* 7.4*  HCT 32.1*   < > 31.0*   < > 27.0* 30.9* 24.7* 26.1* 25.7*  MCV 104.2*   < > 103.3*  --   --  104.0* 106.9* 104.4* 109.8*  PLT 103*   < > 188  --   --  230 208 252 292   < > = values in this interval not displayed.    Basic Metabolic Panel: Recent Labs  Lab 12/07/2020 1010 11/24/20 0842 11/24/20 1154 11/25/20 0649 11/25/20 2105 12/08/2020 1039 11/12/2020 2106 11/25/2020 2113 11/27/20 0810 11/28/20 0646 11/29/20 0452 11/30/20 0419  NA 141   < >  142   < > 140 141   < > 143 143 145 146* 150*  K 3.9   < > 5.6*   < > 4.7 4.8   < > 4.7 5.0 4.7 4.1 3.9  CL 105  --  108   < > 108 108  --   --  110 110 113* 118*  CO2 23  --  28   < > 24 25  --   --  25 29 26 22   GLUCOSE 163*  --  280*   < > 167* 334*  --   --  181* 189* 192* 127*  BUN 57*  --  64*   < > 52* 51*  --   --  55* 63* 63* 67*  CREATININE 1.38*  --  1.45*   < > 1.21 1.13  --   --  1.11 1.13 1.08 1.23  CALCIUM 7.6*  --  7.6*   < > 7.9* 7.8*  --   --  8.2* 8.2* 8.1* 8.3*  MG 1.8  --  2.6*   < > 2.3 2.4  --   --  2.2 2.2 2.2  --   PHOS 3.2  --  4.3  --   --   --   --   --  3.1  --  3.0  --    < > = values in this  interval not displayed.   GFR: Estimated Creatinine Clearance: 48.6 mL/min (by C-G formula based on SCr of 1.23 mg/dL). Recent Labs  Lab 11/27/20 0810 11/28/20 0646 11/29/20 0452 11/30/20 0419  WBC 33.0* 26.3* 25.5* 27.7*    Liver Function Tests: Recent Labs  Lab 12/03/2020 1010 11/24/20 1154 11/27/20 0810  AST 31 33 26  ALT 13 16 21   ALKPHOS 42 56 83  BILITOT 3.0* 1.7* 1.6*  PROT 4.6* 4.8* 4.7*  ALBUMIN 1.3* 1.3* 1.4*   No results for input(s): LIPASE, AMYLASE in the last 168 hours. No results for input(s): AMMONIA in the last 168 hours.  ABG    Component Value Date/Time   PHART 7.361 11/27/2020 2113   PCO2ART 47.6 12/02/2020 2113   PO2ART 41 (L) 11/17/2020 2113   HCO3 26.9 11/20/2020 2113   TCO2 28 11/18/2020 2113   ACIDBASEDEF 2.0 11/22/2020 1658   O2SAT 72.0 11/17/2020 2113     Coagulation Profile: No results for input(s): INR, PROTIME in the last 168 hours.  Cardiac Enzymes: No results for input(s): CKTOTAL, CKMB, CKMBINDEX, TROPONINI in the last 168 hours.  HbA1C: Hgb A1c MFr Bld  Date/Time Value Ref Range Status  11/19/2020 10:37 AM 5.0 4.8 - 5.6 % Final    Comment:    (NOTE) Pre diabetes:          5.7%-6.4%  Diabetes:              >6.4%  Glycemic control for   <7.0% adults with diabetes   12/17/2019 08:19 AM 4.5 (L) 4.8 - 5.6 % Final    Comment:    (NOTE) Pre diabetes:          5.7%-6.4% Diabetes:              >6.4% Glycemic control for   <7.0% adults with diabetes    The patient is critically ill with multiple organ systems failure and requires high complexity decision making for assessment and support, frequent evaluation and titration of therapies, application of advanced monitoring technologies and extensive interpretation of multiple databases. Critical Care Time devoted  to patient care services described in this note independent of APP/resident time (if applicable)  is 40 minutes.   Sherrilyn Rist MD [facility] Personal pager: [phone] If unanswered, please page CCM On-call: [phone]

## 2020-11-30 NOTE — Progress Notes (Signed)
Progress Note: General Surgery Service   Chief Complaint/Subjective: No events overnight, tolerating tube feeds - ostomy output appears like tube feeds as well.  Objective: Vital signs in last 24 hours: Temp:  [96.4 F (35.8 C)-100.5 F (38.1 C)] 97.3 F (36.3 C) (12/19 0731) Pulse Rate:  [71-131] 81 (12/19 0900) Resp:  [12-34] 25 (12/19 0900) BP: (79-156)/(53-75) 119/75 (12/19 0900) SpO2:  [97 %-100 %] 100 % (12/19 0900) FiO2 (%):  [30 %-40 %] 30 % (12/19 0900) Weight:  [65.2 kg] 65.2 kg (12/19 0300) Last BM Date: 11/29/20  Intake/Output from previous day: 12/18 0701 - 12/19 0700 In: 2843.9 [I.V.:1454.9; NG/GT:1173; IV Piggyback:216] Out: 5850 [Urine:2150; Stool:3700] Intake/Output this shift: Total I/O In: 32.2 [I.V.:32.2] Out: -   Gen: intubated, arousable, opens eyes to voice. Shakes head no when asked if he is in pain.  Resp: assisted  Card: RRR  Abd: soft, RUQ mucous fistula dusky with some mucosal sloughing at skin level - some pink mucosa is however noted just below the level of the skin, LUQ ostomy pink with large liquid output - tube feeds in appearance  Lab Results: CBC  Recent Labs    11/29/20 0452 11/30/20 0419  WBC 25.5* 27.7*  HGB 8.2* 7.4*  HCT 26.1* 25.7*  PLT 252 292   BMET Recent Labs    11/29/20 0452 11/30/20 0419  NA 146* 150*  K 4.1 3.9  CL 113* 118*  CO2 26 22  GLUCOSE 192* 127*  BUN 63* 67*  CREATININE 1.08 1.23  CALCIUM 8.1* 8.3*   PT/INR No results for input(s): LABPROT, INR in the last 72 hours. ABG No results for input(s): PHART, HCO3 in the last 72 hours.  Invalid input(s): PCO2, PO2  Anti-infectives: Anti-infectives (From admission, onward)   Start     Dose/Rate Route Frequency Ordered Stop   11/20/2020 0945  cefoTEtan (CEFOTAN) 2 g in sodium chloride 0.9 % 100 mL IVPB  Status:  Discontinued        2 g 200 mL/hr over 30 Minutes Intravenous To Surgery 11/17/2020 0936 11/16/2020 1057   12/07/2020 0900  cefoTEtan (CEFOTAN) 2  g in sodium chloride 0.9 % 100 mL IVPB  Status:  Discontinued        2 g 200 mL/hr over 30 Minutes Intravenous  Once 12/05/2020 0833 11/30/2020 0936   11/19/20 1130  piperacillin-tazobactam (ZOSYN) IVPB 3.375 g        3.375 g 12.5 mL/hr over 240 Minutes Intravenous Every 8 hours 11/19/20 1017 12/06/2020 2359   11/19/20 1115  fluconazole (DIFLUCAN) IVPB 200 mg        200 mg 100 mL/hr over 60 Minutes Intravenous Every 24 hours 11/19/20 1017 11/21/20 1104   11/19/20 0915  Ampicillin-Sulbactam (UNASYN) 3 g in sodium chloride 0.9 % 100 mL IVPB  Status:  Discontinued        3 g 200 mL/hr over 30 Minutes Intravenous Every 12 hours 11/19/20 0826 11/19/20 0949      Medications: Scheduled Meds: . chlorhexidine gluconate (MEDLINE KIT)  15 mL Mouth Rinse BID  . Chlorhexidine Gluconate Cloth  6 each Topical Daily  . feeding supplement (PROSource TF)  45 mL Per Tube BID  . folic acid  1 mg Per Tube Daily  . free water  100 mL Per Tube Q8H  . heparin injection (subcutaneous)  5,000 Units Subcutaneous Q8H  . insulin aspart  0-9 Units Subcutaneous Q4H  . mouth rinse  15 mL Mouth Rinse 10 times per day  .  metoprolol tartrate  12.5 mg Per Tube BID  . multivitamin with minerals  1 tablet Per Tube Daily  . pantoprazole sodium  40 mg Per Tube QHS  . sodium chloride flush  10-40 mL Intracatheter Q12H   Continuous Infusions: . sodium chloride 10 mL/hr at 11/21/2020 1052  . sodium chloride Stopped (12/07/2020 1358)  . dexmedetomidine (PRECEDEX) IV infusion 0.3 mcg/kg/hr (11/30/20 0900)  . dextrose    . feeding supplement (PIVOT 1.5 CAL) 60 mL/hr at 11/30/20 0015  . fentaNYL infusion INTRAVENOUS 40 mcg/hr (11/30/20 0900)  . levETIRAcetam 750 mg (11/29/20 2146)   PRN Meds:.acetaminophen, fentaNYL, fentaNYL (SUBLIMAZE) injection, hydrALAZINE, ondansetron (ZOFRAN) IV, sodium chloride flush  Assessment/Plan: s/p Procedure(s): EXPLORATORY LAPAROTOMY AND ABDOMINAL CLOSURE ILEOSTOMY WITH MUCOUS FISTULA CREATION  11/14/2020  SBO -s/p ex lap with SBR, placement of a gastrostomy tube12/7 by Dr. Georgette Dover. Takeback 12/12 by Dr. Zenia Resides for necrotic SB with resection of additional 60-70cm of SB inclusive of prior anastomosis in discontinuity with open abdomen. - S/P ex lap, ileostomy, mucous fistula, closure 12/15 by Dr. Grandville Silos Malnourishment - continued clinical decline in a patient who was previously quite deconditioned. Lengthy goals of care discussion with family are ongoing VDRF- self-extubated 12/13 AM, re-intubated ~3h after. Second time failing extubation, desat overnight, PEEP 8, per CCM, CXR now. Likely would need trach to come off the vent. On FiO2 60% and PEEP 10 so cannot trach yet. Unsure if family will want that as well. Shock/ID- resolved; septic, completed 5d zosyn, necrotic bowel was most likely source. Pressors are off CV/AF RVR - in NSR currently FEN- advance TF via G tube; would consider continuing TPN at present as it appears he is not absorbing much if any of his tube feeds; started fiber+ imodium 2 mg Q 8hrs today; will likely need to escalate in coming days but will do so slowly to prevent medication induced ileus. VTE-SQH Dispo - ICU   LOS: 18 days   Ileana Roup, MD [facility]

## 2020-12-01 ENCOUNTER — Inpatient Hospital Stay (HOSPITAL_COMMUNITY): Payer: Medicare PPO

## 2020-12-01 DIAGNOSIS — R29898 Other symptoms and signs involving the musculoskeletal system: Secondary | ICD-10-CM

## 2020-12-01 DIAGNOSIS — K559 Vascular disorder of intestine, unspecified: Secondary | ICD-10-CM

## 2020-12-01 LAB — CBC WITH DIFFERENTIAL/PLATELET
Abs Immature Granulocytes: 0.98 10*3/uL — ABNORMAL HIGH (ref 0.00–0.07)
Basophils Absolute: 0.1 10*3/uL (ref 0.0–0.1)
Basophils Relative: 0 %
Eosinophils Absolute: 0.1 10*3/uL (ref 0.0–0.5)
Eosinophils Relative: 1 %
HCT: 25.7 % — ABNORMAL LOW (ref 39.0–52.0)
Hemoglobin: 7.7 g/dL — ABNORMAL LOW (ref 13.0–17.0)
Immature Granulocytes: 4 %
Lymphocytes Relative: 11 %
Lymphs Abs: 2.6 10*3/uL (ref 0.7–4.0)
MCH: 31.6 pg (ref 26.0–34.0)
MCHC: 30 g/dL (ref 30.0–36.0)
MCV: 105.3 fL — ABNORMAL HIGH (ref 80.0–100.0)
Monocytes Absolute: 1.2 10*3/uL — ABNORMAL HIGH (ref 0.1–1.0)
Monocytes Relative: 5 %
Neutro Abs: 19.2 10*3/uL — ABNORMAL HIGH (ref 1.7–7.7)
Neutrophils Relative %: 79 %
Platelets: 346 10*3/uL (ref 150–400)
RBC: 2.44 MIL/uL — ABNORMAL LOW (ref 4.22–5.81)
RDW: 16.4 % — ABNORMAL HIGH (ref 11.5–15.5)
WBC: 24.1 10*3/uL — ABNORMAL HIGH (ref 4.0–10.5)
nRBC: 0.2 % (ref 0.0–0.2)

## 2020-12-01 LAB — BASIC METABOLIC PANEL
Anion gap: 10 (ref 5–15)
BUN: 63 mg/dL — ABNORMAL HIGH (ref 8–23)
CO2: 20 mmol/L — ABNORMAL LOW (ref 22–32)
Calcium: 8.2 mg/dL — ABNORMAL LOW (ref 8.9–10.3)
Chloride: 122 mmol/L — ABNORMAL HIGH (ref 98–111)
Creatinine, Ser: 1.13 mg/dL (ref 0.61–1.24)
GFR, Estimated: >60 mL/min (ref >60)
Glucose, Bld: 116 mg/dL — ABNORMAL HIGH (ref 70–99)
Potassium: 3.7 mmol/L (ref 3.5–5.1)
Sodium: 152 mmol/L — ABNORMAL HIGH (ref 135–145)

## 2020-12-01 LAB — GLUCOSE, CAPILLARY
Glucose-Capillary: 101 mg/dL — ABNORMAL HIGH (ref 70–99)
Glucose-Capillary: 105 mg/dL — ABNORMAL HIGH (ref 70–99)
Glucose-Capillary: 108 mg/dL — ABNORMAL HIGH (ref 70–99)
Glucose-Capillary: 111 mg/dL — ABNORMAL HIGH (ref 70–99)
Glucose-Capillary: 116 mg/dL — ABNORMAL HIGH (ref 70–99)
Glucose-Capillary: 96 mg/dL (ref 70–99)

## 2020-12-01 MED ORDER — FREE WATER
200.0000 mL | Freq: Three times a day (TID) | Status: DC
  Administered 2020-12-01 – 2020-12-02 (×3): 200 mL

## 2020-12-01 MED ORDER — ACETAMINOPHEN 650 MG RE SUPP: 650.0000 mg | RECTAL | Status: DC | PRN

## 2020-12-01 MED ORDER — LOPERAMIDE HCL 1 MG/7.5ML PO SUSP
4.0000 mg | Freq: Three times a day (TID) | ORAL | Status: DC
  Administered 2020-12-01 – 2020-12-13 (×36): 4 mg
  Filled 2020-12-01 (×37): qty 30

## 2020-12-01 MED ORDER — ACETAMINOPHEN 325 MG PO TABS
650.0000 mg | ORAL_TABLET | Freq: Four times a day (QID) | ORAL | Status: DC | PRN
  Administered 2020-12-01 – 2020-12-12 (×4): 650 mg
  Filled 2020-12-01 (×3): qty 2

## 2020-12-01 MED ORDER — LEVETIRACETAM 100 MG/ML PO SOLN
750.0000 mg | Freq: Two times a day (BID) | ORAL | Status: DC
  Administered 2020-12-01 – 2020-12-21 (×41): 750 mg
  Filled 2020-12-01 (×41): qty 10

## 2020-12-01 NOTE — Progress Notes (Signed)
Dr Rosendo Gros aware of small amount of tube feed/purulent looking output around g-tube. No additional orders.

## 2020-12-01 NOTE — Consult Note (Signed)
[facility] Nurse ostomy follow up Stoma type/location: RUQ urostomy and proximal RUQ MF with dark stool noted.  LUQ ileostomy with high output.  Stomal assessment/size: 1" urostomy, prolapsed stomal tissue noted. RUQ MF 1 3/8" dusky, but producing dark brown stool.  LUQ ileostomy, high output, consistent with tube feeds.  Peristomal assessment: intact  Treatment options for stomal/peristomal skin: barrier ring RUQ urostomy 2 piece urostomy pouch with barrier ring clear yellow urine RUQ MF with dark brown feculent effluent present.  LUQ pale effluent present consistent with tube feeds.  Output see above Ostomy pouching: 2pc.  2 1/4" fecal pouch, 2 1/4" urostomy pouch and high output ([facility] # [id number]) to LUQ ileostomy.  Education provided: none Enrolled patient in [employer] Discharge program: Yes-previously Will see weekly.    Domenic Moras MSN, RN, FNP-BC [facility] Wound, Ostomy, Continence Nurse Pager [phone]

## 2020-12-01 NOTE — Progress Notes (Signed)
NAME:  Ronald Wolf, MRN:  [id number], DOB:  [birth year], LOS: 33 ADMISSION DATE:  11/13/2020, CONSULTATION DATE:  11/17/2020 REFERRING MD: Cyndia Skeeters CHIEF COMPLAINT:  hypotension   Brief History   Ronald Wolf is a 74 yo M w/ PMH of CVA, Seizures, Dementia, Bladder ca s/p urostomy, tobacco use, right glottic carcinoma s/p excision presenting with sbo. Found to have ischemic bowel requiring ex-lap. PCCM consulted due to post-op hypotension / respiratory distress  History of present illness   Ronald Wolf is a 74 yo M w/ above PMH who initially presented to [facility] hospital on 11/11/20 with abdominal pain and nausea of 4-5 day duration. He was found to have SBO but left against medical advice as his family did not feel comfortable with him receiving his medical care in [place]. He came to [facility] ED the next day with similar complaint and was found to have SBO. He was treated with supportive care with bowel rest and ng suction. However his symptoms did not improve. Surgery team was consulted and he was brough to OR for exploratory laparoscopy. He was found to have multiple sections of ischemic bowel which were resected. He was unable to be extubated post-operatively and PCCM was consulted.  Past Medical History  CVA, Dementia, Bladder CA s/p nephrostomy, tobacco use, right glottic carcinoma s/p excision, seizures.  Significant Hospital Events   12/06/2020 Admission 12/10/2020 OR 11/26/2020 Admit to ICU 12/10 Brief SVT with associated hypertension. Self limited Given fentanyl, metop and hydral for HTN 12/11 Good urine output with Lasix , failed extubation, reintubated within 2 hours due to inability to clear secretions 12/12 return OR for enteric leak-- found to have bowel necrosis. Left in discontinuity  12/13 self extubated, reintubated. On pressors. Family meeting with PCCM and CCS to discuss possible next steps.  12/15-was back in OR, abdominal closure\ 12/19: No significant overnight events, high  ileostomy output   Consults:  General Surgery PCCM  Procedures:  11/28/2020 Ex-lap, G-tube placement ETT 12/7 >> 12/11, >> 12/15- back to OR for abdominal closure, exploratory laparotomy  Significant Diagnostic Tests:   CT abd/pelvis 12/2 > High-grade distal small bowel obstruction, possibly due to adhesion. Mild ascites and diffuse mesenteric edema. No evidence of focal inflammatory process or abscess. Small bilateral pleural effusions and mild right lower lobe Atelectasis.  CXR 12/20> PICC stable, ETT stable. Bibasilar infiltrates/edema without interval change   Micro Data:  11/29/2020 COVID/Flu negative 12/7 BA: Few GNR rare GPC -- predominantly PMN  12/7 BCx> NGTD  12/11 respiratory -no organism in tracheal aspirate  Antimicrobials:  Fluc 12/8>> 12/10 Zosyn 12/8> 12/12  Interim history/subjective:   NAEO  Na increased to 152 Remains fiO2 30%   Objective   Blood pressure 109/61, pulse 79, temperature 98 F (36.7 C), temperature source Oral, resp. rate (!) 22, height 5\' 11"  (1.803 m), weight 60.3 kg, SpO2 100 %.    Vent Mode: PRVC FiO2 (%):  [30 %] 30 % Set Rate:  [15 bmp] 15 bmp Vt Set:  [450 mL] 450 mL PEEP:  [5 cmH20] 5 cmH20 Plateau Pressure:  [15 cmH20-18 cmH20] 15 cmH20   Intake/Output Summary (Last 24 hours) at 12/01/2020 0814 Last data filed at 12/01/2020 4818 Gross per 24 hour  Intake 2117.36 ml  Output 5000 ml  Net -2882.64 ml   Filed Weights   11/29/20 0500 11/30/20 0300 12/01/20 0455  Weight: 65.6 kg 65.2 kg 60.3 kg    Examination: Gen: Chronically and critically ill, frail appearing elderly M, intubated  lightly sedated NAD  HEENT: Temporal muscle wasting. Anicteric sclera. Pink tacky mm. NGT and ETT secure  Lungs: Symmetrical chest expansion. Diminished bibasilar sounds. No accessory muscle use  CV: rrr  s1s2 no rgm cap refill < 3 sec Abd: G tube. RUQ mucus fistula. LUQ ostomy with liquid output  GU: Urostomy collecting yellow urine  Ext:  Symmetrically decreased muscle mass BUE BLE. No obvious joint deformity  Skin: c/d/w Neuro: Awake. Does not follow commands. [facility] Problem list   AKI   Assessment & Plan:   Acute encephalopathy, multifactorial -Minimize sedation -RASS goal of 0 to -1  Acute hypoxic respiratory failure requiring mechanical ventilation  Has been intubated x3 during this hospitalization CXR with bibasilar infiltrates vs edema -- last tracheal aspirate 12/11 with no orgs  Pleural effusions, improved  Secondary to malnutrition, low albumin P -Has failed extubation multiple times, mentation ongoing barrier for weaning criteria, and now is significantly more debilitated than for prior attempts  -If family wishes, will need tracheostomy. Has essentially been intubated since 12/7.   SBO Ischemic bowel perforation  -s/p 2 ex laps, resection of ischemic bowel, ileostomy, colostomy  -CCS following -has had fairly poor EN tolerance, EN initiated 12/17 -Might need to restart TPN   Hypernatremia -s/p bolus of D5W -will try increasing FWF from 100 to 216ml q8. If doesn't tolerate well will start d5gtt -continue to trend   HTN SVT, improved AFib RVR -Continue scheduled metop  -PRN hydral  -Not a safe candidate for anticoagulation  Severe protein calorie malnutrition Enteric nutrition CCS recs restarting TPN due to high ostomy outpt and poor EN tolerance   Hx seizure -On Keppra  History of bladder cancer -continue urostomy care  Anemia -Transfuse per protocol  Goals of Care -poor baseline due to multiple medical problems, post emergent surgery with slow recovery, failed extubations (12/11, 12/13) due to general deconditioning and inability to clear secretions.  -12/13 Family discussion with CCS and PCCM. Family has decided to pursue all offered aggressive interventions (return OR for abd, trach, full code) -Palliative care consult appreciated for ongoing [place]  discussions -At this time remains Full Code. Need to clarify if trach desired -Guarded prognosis   Best practice (evaluated daily)  Diet: EN Pain/Anxiety/Delirium protocol (if indicated): Fent, precedex VAP protocol (if indicated): Yes  DVT prophylaxis: SQH  GI prophylaxis: PPI Glucose control: SSI  Mobility:BR  Family updated - please see lengthy documentation 12/13 RE multidisciplinary [place]. Daily updates have since been provided, and are pending 12/20  Code Status: Full Disposition: ICU  Labs   CBC: Recent Labs  Lab 11/24/20 1154 11/25/20 0649 11/27/20 0810 11/28/20 0646 11/29/20 0452 11/30/20 0419 12/01/20 0506  WBC 27.5*   < > 33.0* 26.3* 25.5* 27.7* 24.1*  NEUTROABS 22.3*  --   --   --   --  25.2* 19.2*  HGB 9.6*   < > 9.3* 7.3* 8.2* 7.4* 7.7*  HCT 32.1*   < > 30.9* 24.7* 26.1* 25.7* 25.7*  MCV 104.2*   < > 104.0* 106.9* 104.4* 109.8* 105.3*  PLT 103*   < > 230 208 252 292 346   < > = values in this interval not displayed.    Basic Metabolic Panel: Recent Labs  Lab 11/24/20 1154 11/25/20 0649 11/25/20 2105 11/13/2020 1039 11/25/2020 2106 11/27/20 0810 11/28/20 0646 11/29/20 0452 11/30/20 0419 12/01/20 0506  NA 142   < > 140 141   < > 143 145 146* 150* 152*  K 5.6*   < > 4.7 4.8   < > 5.0 4.7 4.1 3.9 3.7  CL 108   < > 108 108  --  110 110 113* 118* 122*  CO2 28   < > 24 25  --  25 29 26 22  20*  GLUCOSE 280*   < > 167* 334*  --  181* 189* 192* 127* 116*  BUN 64*   < > 52* 51*  --  55* 63* 63* 67* 63*  CREATININE 1.45*   < > 1.21 1.13  --  1.11 1.13 1.08 1.23 1.13  CALCIUM 7.6*   < > 7.9* 7.8*  --  8.2* 8.2* 8.1* 8.3* 8.2*  MG 2.6*   < > 2.3 2.4  --  2.2 2.2 2.2  --   --   PHOS 4.3  --   --   --   --  3.1  --  3.0  --   --    < > = values in this interval not displayed.   GFR: Estimated Creatinine Clearance: 48.9 mL/min (by C-G formula based on SCr of 1.13 mg/dL). Recent Labs  Lab 11/28/20 0646 11/29/20 0452 11/30/20 0419 12/01/20 0506  WBC 26.3*  25.5* 27.7* 24.1*    Liver Function Tests: Recent Labs  Lab 11/24/20 1154 11/27/20 0810  AST 33 26  ALT 16 21  ALKPHOS 56 83  BILITOT 1.7* 1.6*  PROT 4.8* 4.7*  ALBUMIN 1.3* 1.4*   No results for input(s): LIPASE, AMYLASE in the last 168 hours. No results for input(s): AMMONIA in the last 168 hours.  ABG    Component Value Date/Time   PHART 7.361 11/29/2020 2113   PCO2ART 47.6 12/04/2020 2113   PO2ART 41 (L) 11/14/2020 2113   HCO3 26.9 11/21/2020 2113   TCO2 28 11/25/2020 2113   ACIDBASEDEF 2.0 11/17/2020 1658   O2SAT 72.0 12/07/2020 2113     Coagulation Profile: No results for input(s): INR, PROTIME in the last 168 hours.  Cardiac Enzymes: No results for input(s): CKTOTAL, CKMB, CKMBINDEX, TROPONINI in the last 168 hours.  HbA1C: Hgb A1c MFr Bld  Date/Time Value Ref Range Status  11/19/2020 10:37 AM 5.0 4.8 - 5.6 % Final    Comment:    (NOTE) Pre diabetes:          5.7%-6.4%  Diabetes:              >6.4%  Glycemic control for   <7.0% adults with diabetes   12/17/2019 08:19 AM 4.5 (L) 4.8 - 5.6 % Final    Comment:    (NOTE) Pre diabetes:          5.7%-6.4% Diabetes:              >6.4% Glycemic control for   <7.0% adults with diabetes    CRITICAL CARE Performed by: Cristal Generous   Total critical care time: 38 minutes  Critical care time was exclusive of separately billable procedures and treating other patients. Critical care was necessary to treat or prevent imminent or life-threatening deterioration.  Critical care was time spent personally by me on the following activities: development of treatment plan with patient and/or surrogate as well as nursing, discussions with consultants, evaluation of patient's response to treatment, examination of patient, obtaining history from patient or surrogate, ordering and performing treatments and interventions, ordering and review of laboratory studies, ordering and review of radiographic studies, pulse  oximetry and re-evaluation of patient's condition.  Eliseo Gum MSN, AGACNP-BC  [place] [phone] If no answer, [phone] 12/01/2020, 7:23 AM

## 2020-12-01 NOTE — Progress Notes (Signed)
5 Days Post-Op   Subjective/Chief Complaint: Pt with no acute changes overnight  Objective: Vital signs in last 24 hours: Temp:  [96.5 F (35.8 C)-98.9 F (37.2 C)] 97.4 F (36.3 C) (12/20 0800) Pulse Rate:  [75-99] 81 (12/20 0821) Resp:  [20-33] 23 (12/20 0821) BP: (88-134)/(59-84) 88/60 (12/20 0800) SpO2:  [100 %] 100 % (12/20 0821) FiO2 (%):  [30 %] 30 % (12/20 0821) Weight:  [60.3 kg] 60.3 kg (12/20 0455) Last BM Date: 11/30/20  Intake/Output from previous day: 12/19 0701 - 12/20 0700 In: 2117.4 [I.V.:199; NG/GT:1840; IV Piggyback:78.4] Out: 5000 [Urine:1250; Stool:3750] Intake/Output this shift: Total I/O In: 16.8 [I.V.:16.8] Out: -   PE: Gen: intubated, arousable, opens eyes to voice. Shakes head no when asked if he is in pain.  Resp: assisted  Card: RRR  Abd: soft, RUQ mucous fistula dusky with some mucosal sloughing at skin level - some pink mucosa is however noted just below the level of the skin, LUQ ostomy pink with large liquid output - tube feeds in appearance  Lab Results:  Recent Labs    11/30/20 0419 12/01/20 0506  WBC 27.7* 24.1*  HGB 7.4* 7.7*  HCT 25.7* 25.7*  PLT 292 346   BMET Recent Labs    11/30/20 0419 12/01/20 0506  NA 150* 152*  K 3.9 3.7  CL 118* 122*  CO2 22 20*  GLUCOSE 127* 116*  BUN 67* 63*  CREATININE 1.23 1.13  CALCIUM 8.3* 8.2*  Studies/Results: DG Chest Port 1 View  Result Date: 12/01/2020 CLINICAL DATA:  Intubation.  Bowel resection. EXAM: PORTABLE CHEST 1 VIEW COMPARISON:  11/30/2020. FINDINGS: Endotracheal tube, NG tube, PICC line in stable position. Heart size normal. Bibasilar pulmonary infiltrates/edema again noted without interim change. No pleural effusion or pneumothorax. IMPRESSION: 1. Lines and tubes in stable position. 2. Bibasilar pulmonary infiltrates/edema again noted without interim change. Electronically Signed   By: Marcello Moores  Register   On: 12/01/2020 05:37   DG Chest Port 1 View  Result Date:  11/30/2020 CLINICAL DATA:  Acute respiratory failure. Endotracheally intubated. EXAM: PORTABLE CHEST 1 VIEW COMPARISON:  11/27/2020 FINDINGS: Support lines and tubes in appropriate position. Bibasilar pulmonary opacity shows mild improvement since previous study. No new or worsening areas is of opacity are seen. Heart size is normal. IMPRESSION: Mild improvement in bibasilar pulmonary opacity. Electronically Signed   By: Marlaine Hind M.D.   On: 11/30/2020 10:47    Anti-infectives: Anti-infectives (From admission, onward)   Start     Dose/Rate Route Frequency Ordered Stop   12/09/2020 0945  cefoTEtan (CEFOTAN) 2 g in sodium chloride 0.9 % 100 mL IVPB  Status:  Discontinued        2 g 200 mL/hr over 30 Minutes Intravenous To Surgery 11/30/2020 0936 12/12/2020 1057   12/12/2020 0900  cefoTEtan (CEFOTAN) 2 g in sodium chloride 0.9 % 100 mL IVPB  Status:  Discontinued        2 g 200 mL/hr over 30 Minutes Intravenous  Once 12/03/2020 0833 11/28/2020 0936   11/19/20 1130  piperacillin-tazobactam (ZOSYN) IVPB 3.375 g        3.375 g 12.5 mL/hr over 240 Minutes Intravenous Every 8 hours 11/19/20 1017 11/25/2020 2359   11/19/20 1115  fluconazole (DIFLUCAN) IVPB 200 mg        200 mg 100 mL/hr over 60 Minutes Intravenous Every 24 hours 11/19/20 1017 11/21/20 1104   11/19/20 0915  Ampicillin-Sulbactam (UNASYN) 3 g in sodium chloride 0.9 % 100 mL IVPB  Status:  Discontinued        3 g 200 mL/hr over 30 Minutes Intravenous Every 12 hours 11/19/20 0826 11/19/20 0949      Assessment/Plan: s/p Procedure(s): EXPLORATORY LAPAROTOMY AND ABDOMINAL CLOSURE ILEOSTOMY WITH MUCOUS FISTULA CREATION 12/01/2020  SBO -s/p ex lap with SBR, placement of a gastrostomy tube12/7 by Dr. Georgette Dover. Takeback 12/12 by Dr. Zenia Resides for necrotic SB with resection of additional 60-70cm of SB inclusive of prior anastomosis in discontinuity with open abdomen. - S/P ex lap, ileostomy, mucous fistula, closure 12/15 by Dr. Grandville Silos Malnourishment -  continued clinical decline in a patient who was previously quite deconditioned. Lengthy goals of care discussion with family are ongoing VDRF- self-extubated 12/13 AM, re-intubated ~3h after. Second time failing extubation, desat overnight, PEEP 8, per CCM, CXR now. Likely would need trach to come off the vent. On FiO2 60% and PEEP 10 so cannot trach yet. Unsure if family will want that as well. Shock/ID- resolved; septic, completed 5d zosyn, necrotic bowel was most likely source. Pressors are off CV/AF RVR- in NSR currently FEN- advance TF via G tube; would consider continuing TPN at present as it appears he is not absorbing much if any of his tube feeds; on fiber+ imodium 4 mg Q 8hrs today; VTE-SQH Dispo - ICU   LOS: 19 days    Ralene Ok 12/01/2020

## 2020-12-01 NOTE — Progress Notes (Addendum)
Daily Progress Note   Patient Name: Ronald Wolf       Date: 12/01/2020 DOB: November 02, 1946  Age: 74 y.o. MRN#: [id number] Attending Physician: Chesley Mires, MD Primary Care Physician: Mosie Lukes, MD Admit Date: 11/17/2020  Reason for Consultation/Follow-up:  To discuss complex medical decision making related to patient's goals of care  Patient seems more alert today.  Able to shake his head that he is Not comfortable.   Then Nod his head that yes the tube in his throat bothered him. Discussed with NP Shirlee Limerick of PCCM. Spoke with Ermalinda Memos and subsequently daughter (Media planner) Ronald Wolf  Subjective: Discussed with Ronald Wolf that patient was usually very drowsy but today was more alert and bothered by the ETT.  Explained that he will probably need TPN as tube feeds are flowing right thru him to the ileostomy and likely not being absorbed.  Further explained that he is ready to trach - but asked the family to truly consider what we are preparing him for by placing a trach.   He will be bedbound, with a trach, and artificial feeding support most likely for the rest of his life.   Is that a life he would want?   Ronald Wolf agreed to come see her father tomorrow and talk with Palliative.  In my conversation with Ronald Wolf I described the same.  Ronald Wolf has been distributing information from the doctors to the rest of the family.  She stated that the family talked over the weekend and they all decided they wanted the trach.   Assessment: Patient does not appear to be absorbing tube feeds.  Able to indicate to me that he is not comfortable.     Patient Profile/HPI:  74 y.o.malewith past medical history of bladder cancer s/p urostomy, seizure, disorder, right glottic cancer s/p excision, atrial  fibrillation, tobacco abuse, CVA, hepatitis C, anxiety and depressionwho was admitted on 12/1/2021with small bowel obstruction. He had left AMA from [facility]. Initially he was treated with conservative measures but on 12/7 he underwent exploratory laproscopy and part of his small bowel was resected.He was taken back to the OR on 12/12 for further bowel resection. On 12/15 he had abdominal closure and ileostomy placement. During this time he has suffered from respiratory failure and been intubated 3 times this admission.  In order  to continue he will need tracheostomy. He is quite cachectic and was on TPN.  He is being tried on tube feeds but does not appear to be able to absorb them as he has high ileostomy output despite imodium and fiber.  Length of Stay: 19   Vital Signs: BP 111/62   Pulse 79   Temp 98.9 F (37.2 C) (Axillary)   Resp 18   Ht 5\' 11"  (1.803 m)   Wt 60.3 kg   SpO2 100%   BMI 18.54 kg/m  SpO2: SpO2: 100 % O2 Device: O2 Device: Ventilator O2 Flow Rate: O2 Flow Rate (L/min): 4 L/min       Palliative Assessment/Data: 10%     Palliative Care Plan    Recommendations/Plan:  PMT meeting with decision making surrogate, Ronald Wolf, on 12/21 at 10:00.   PCCM NP to join as time permits. - thanks!  Code Status:  Full code  Prognosis:  Unable to determine    Thank you for allowing the Palliative Medicine Team to assist in the care of this patient.  Total time spent:  35 min.     Greater than 50%  of this time was spent counseling and coordinating care related to the above assessment and plan.  Florentina Jenny, PA-C Palliative Medicine  Please contact Palliative MedicineTeam phone at [phone] for questions and concerns between 7 am - 7 pm.   Please see AMION for individual provider pager numbers.

## 2020-12-02 ENCOUNTER — Inpatient Hospital Stay (HOSPITAL_COMMUNITY): Payer: Medicare PPO

## 2020-12-02 DIAGNOSIS — Z7189 Other specified counseling: Secondary | ICD-10-CM

## 2020-12-02 DIAGNOSIS — Z9911 Dependence on respirator [ventilator] status: Secondary | ICD-10-CM

## 2020-12-02 DIAGNOSIS — R627 Adult failure to thrive: Secondary | ICD-10-CM

## 2020-12-02 LAB — BASIC METABOLIC PANEL
Anion gap: 10 (ref 5–15)
BUN: 61 mg/dL — ABNORMAL HIGH (ref 8–23)
CO2: 20 mmol/L — ABNORMAL LOW (ref 22–32)
Calcium: 8.3 mg/dL — ABNORMAL LOW (ref 8.9–10.3)
Chloride: 128 mmol/L — ABNORMAL HIGH (ref 98–111)
Creatinine, Ser: 1.21 mg/dL (ref 0.61–1.24)
GFR, Estimated: >60 mL/min (ref >60)
Glucose, Bld: 99 mg/dL (ref 70–99)
Potassium: 3.8 mmol/L (ref 3.5–5.1)
Sodium: 158 mmol/L — ABNORMAL HIGH (ref 135–145)

## 2020-12-02 LAB — CBC
HCT: 28.6 % — ABNORMAL LOW (ref 39.0–52.0)
Hemoglobin: 8.9 g/dL — ABNORMAL LOW (ref 13.0–17.0)
MCH: 32.4 pg (ref 26.0–34.0)
MCHC: 31.1 g/dL (ref 30.0–36.0)
MCV: 104 fL — ABNORMAL HIGH (ref 80.0–100.0)
Platelets: 339 10*3/uL (ref 150–400)
RBC: 2.75 MIL/uL — ABNORMAL LOW (ref 4.22–5.81)
RDW: 16.7 % — ABNORMAL HIGH (ref 11.5–15.5)
WBC: 18 10*3/uL — ABNORMAL HIGH (ref 4.0–10.5)
nRBC: 0.4 % — ABNORMAL HIGH (ref 0.0–0.2)

## 2020-12-02 LAB — GLUCOSE, CAPILLARY
Glucose-Capillary: 112 mg/dL — ABNORMAL HIGH (ref 70–99)
Glucose-Capillary: 127 mg/dL — ABNORMAL HIGH (ref 70–99)
Glucose-Capillary: 142 mg/dL — ABNORMAL HIGH (ref 70–99)
Glucose-Capillary: 142 mg/dL — ABNORMAL HIGH (ref 70–99)
Glucose-Capillary: 93 mg/dL (ref 70–99)
Glucose-Capillary: 99 mg/dL (ref 70–99)

## 2020-12-02 MED ORDER — NUTRISOURCE FIBER PO PACK
1.0000 | PACK | Freq: Two times a day (BID) | ORAL | Status: DC
  Administered 2020-12-02 – 2020-12-04 (×4): 1
  Filled 2020-12-02 (×4): qty 1

## 2020-12-02 MED ORDER — LACTATED RINGERS IV BOLUS
500.0000 mL | Freq: Once | INTRAVENOUS | Status: AC
  Administered 2020-12-02: 08:00:00 500 mL via INTRAVENOUS

## 2020-12-02 MED ORDER — DEXTROSE 5 % IV SOLN: INTRAVENOUS | Status: DC

## 2020-12-02 MED ORDER — OSMOLITE 1.5 CAL PO LIQD
1000.0000 mL | ORAL | Status: DC
  Administered 2020-12-02 – 2020-12-03 (×2): 1000 mL

## 2020-12-02 MED ORDER — FREE WATER
300.0000 mL | Freq: Three times a day (TID) | Status: DC
  Administered 2020-12-02 – 2020-12-04 (×7): 300 mL

## 2020-12-02 MED ORDER — PROSOURCE TF PO LIQD
45.0000 mL | Freq: Three times a day (TID) | ORAL | Status: DC
  Administered 2020-12-02 – 2021-01-10 (×112): 45 mL
  Filled 2020-12-02 (×108): qty 45

## 2020-12-02 NOTE — Consult Note (Signed)
[facility] Nurse wound consult note Consultation was completed by review of records, images and assistance from the bedside nurse/clinical staff.   Reason for Consult:G tube site Verified needs with ordering provider via Secure Chat Wound type: MASD (moisture associated skin damage) excessive drainage  Pressure Injury POA: NA Measurement:NA Wound bed:NA Drainage (amount, consistency, odor) yellow brown reported in consult request Periwound:intact Dressing procedure/placement/frequency:  Cut silicone foam like a split gauze and use under Gtube bumper to stabilize tube and absorb excess exudate, if drainage is excessive order split Drawtex gauze Kellie Simmering # [phone]) and use in similar fashion  Change every other day if drainage will allow.  Goal is to stabilize tube to lessen likelihood of further stoma erosion  [facility] Nurse team will follow with you and see patient within 10 days for wound assessments.  Please notify [facility] nurses of any acute changes in the wounds or any new areas of concern [place] nurse team following for ostomy care as well. [place] MSN, [place], [place], [place]

## 2020-12-02 NOTE — Progress Notes (Signed)
Will d/c restraint of left wrist. Pt not reaching for ETT at all during ROM, including during mouthcare. Mitten placed. Will watch closely.

## 2020-12-02 NOTE — Progress Notes (Signed)
6 Days Post-Op   Subjective/Chief Complaint: On vent   Objective: Vital signs in last 24 hours: Temp:  [98.3 F (36.8 C)-99.3 F (37.4 C)] 98.3 F (36.8 C) (12/21 0800) Pulse Rate:  [77-123] 123 (12/21 0900) Resp:  [14-33] 32 (12/21 0900) BP: (100-156)/(57-92) 142/86 (12/21 0900) SpO2:  [99 %-100 %] 99 % (12/21 0900) FiO2 (%):  [30 %] 30 % (12/21 0816) Weight:  [55.6 kg] 55.6 kg (12/21 0500) Last BM Date: 12/02/20  Intake/Output from previous day: 12/20 0701 - 12/21 0700 In: 2336.9 [I.V.:196.9; NG/GT:2040] Out: 4720 [Urine:1325; Stool:3395] Intake/Output this shift: No intake/output data recorded.  General appearance: no distress Resp: clear to auscultation bilaterally Cardio: regular rate and rhythm GI: soft, ileostomy with output looks like TF, mucous fistula pinker Neurologic: Mental status: awake on vent but did not F/C  Lab Results:  Recent Labs    12/01/20 0506 12/02/20 0510  WBC 24.1* 18.0*  HGB 7.7* 8.9*  HCT 25.7* 28.6*  PLT 346 339   BMET Recent Labs    12/01/20 0506 12/02/20 0510  NA 152* 158*  K 3.7 3.8  CL 122* 128*  CO2 20* 20*  GLUCOSE 116* 99  BUN 63* 61*  CREATININE 1.13 1.21  CALCIUM 8.2* 8.3*   PT/INR No results for input(s): LABPROT, INR in the last 72 hours. ABG No results for input(s): PHART, HCO3 in the last 72 hours.  Invalid input(s): PCO2, PO2  Studies/Results: DG CHEST PORT 1 VIEW  Result Date: 12/02/2020 CLINICAL DATA:  Endotracheal tube placement EXAM: PORTABLE CHEST 1 VIEW COMPARISON:  Radiograph 12/01/2020 FINDINGS: *Endotracheal tube tip terminates in the mid trachea, 3 cm from the carina. *Transesophageal tube tip and side port terminate below the margin of imaging, beyond the GE junction. *Left upper extremity PICC terminates at the lower SVC/cavoatrial junction. Hazy opacity is again seen in the right lung base partially obscuring the right hemidiaphragm with milder opacity in the left lung base as well. Not  significantly changed from comparison exam. No pneumothorax. No pneumothorax or visible effusion. Stable cardiomediastinal contours with a calcified aorta. No acute osseous or soft tissue abnormality. Degenerative changes are present in the imaged spine and shoulders. IMPRESSION: 1. Endotracheal tube 3 cm from the carina. 2. Transesophageal tube tip and side port terminate below the margin of imaging, beyond the GE junction. 3. Left upper extremity PICC at the superior cavoatrial junction. 4. Bibasilar opacities, similar to prior. 5.  Aortic Atherosclerosis (ICD10-I70.0). Electronically Signed   By: Lovena Le M.D.   On: 12/02/2020 05:38   DG Chest Port 1 View  Result Date: 12/01/2020 CLINICAL DATA:  Intubation.  Bowel resection. EXAM: PORTABLE CHEST 1 VIEW COMPARISON:  11/30/2020. FINDINGS: Endotracheal tube, NG tube, PICC line in stable position. Heart size normal. Bibasilar pulmonary infiltrates/edema again noted without interim change. No pleural effusion or pneumothorax. IMPRESSION: 1. Lines and tubes in stable position. 2. Bibasilar pulmonary infiltrates/edema again noted without interim change. Electronically Signed   By: Marcello Moores  Register   On: 12/01/2020 05:37    Anti-infectives: Anti-infectives (From admission, onward)   Start     Dose/Rate Route Frequency Ordered Stop   11/24/2020 0945  cefoTEtan (CEFOTAN) 2 g in sodium chloride 0.9 % 100 mL IVPB  Status:  Discontinued        2 g 200 mL/hr over 30 Minutes Intravenous To Surgery 11/21/2020 0936 12/07/2020 1057   12/02/2020 0900  cefoTEtan (CEFOTAN) 2 g in sodium chloride 0.9 % 100 mL IVPB  Status:  Discontinued        2 g 200 mL/hr over 30 Minutes Intravenous  Once 12/01/2020 0833 11/16/2020 0936   11/19/20 1130  piperacillin-tazobactam (ZOSYN) IVPB 3.375 g        3.375 g 12.5 mL/hr over 240 Minutes Intravenous Every 8 hours 11/19/20 1017 11/26/2020 2359   11/19/20 1115  fluconazole (DIFLUCAN) IVPB 200 mg        200 mg 100 mL/hr over 60 Minutes  Intravenous Every 24 hours 11/19/20 1017 11/21/20 1104   11/19/20 0915  Ampicillin-Sulbactam (UNASYN) 3 g in sodium chloride 0.9 % 100 mL IVPB  Status:  Discontinued        3 g 200 mL/hr over 30 Minutes Intravenous Every 12 hours 11/19/20 0826 11/19/20 0949      Assessment/Plan: SBO -s/p ex lap with SBR, placement of a gastrostomy tube12/7 by Dr. Georgette Dover. Takeback 12/12 by Dr. Zenia Resides for necrotic SB with resection of additional 60-70cm of SB inclusive of prior anastomosis in discontinuity with open abdomen. - S/P ex lap, ileostomy, mucous fistula, closure 12/15 by Dr. Grandville Silos Malnourishment - continued clinical decline in a patient who was previously quite deconditioned. Lengthy goals of care discussion with family are ongoing VDRF- self-extubated 12/13 AM, re-intubated ~3h after. Second time failing extubation, vent settings back down to normal, per CCM Shock/ID- resolved; septic, completed 5d zosyn, necrotic bowel was most likely source. Pressors are off CV/AF RVR- in NSR currently FEN- advance TF via G tube; not absorbing much TF, TNA, fiber added VTE-SQH Dispo - ICU Palliative Care meeting with the family.  LOS: 20 days    Ronald Wolf 12/02/2020

## 2020-12-02 NOTE — Progress Notes (Addendum)
PCCM Family Meeting 12/02/2020 10:08 AM  Family meeting occurred 12/02/2020 with Palliative Care NP Elmo Putt, myself, pts daughter Derrick Ravel and pts sister Clarene Critchley.  We discussed overall illness course, continued debilitation, inability to wean from ventilator. All parties agree that the patient seems to be getting weaker, despite very aggressive medical and surgical interventions. Derrick Ravel shares her conflicting feelings of wanting to keep the patient alive vs not wanting him to suffer. Emotional support provided.  We discussed directions of care moving forward (including discussions purpose of tracheostomy, likely prolonged vent requirement, need for artificial nutrition support -- as well as symptom based/comfort management) Family would like to proceed with tracheostomy placement to see if the tracheostomy provides more comfort for the patient, and then revisit potential care trajectories from there.   We additionally discussed Code Status. I explained that a code status is essentially our "game plan" for if a person's heart stopped, despite ongoing(+escalating if indicated) medical interventions. Family will further discuss together.   Summary: -will pursue tracheostomy. (I am unsure when this would be completed. Will d/w PCCM MD) -continue all aggressive medical interventions -remains Full Code  -continue ongoing [place] discussions-- appreciate palliative care's ongoing involvement    Time Spent: 30 minutes  Eliseo Gum MSN, AGACNP-BC [place] [phone] If no answer, [phone] 12/02/2020, 11:06 AM

## 2020-12-02 NOTE — Progress Notes (Signed)
PHARMACY - TOTAL PARENTERAL NUTRITION CONSULT NOTE  Indication: Small bowel obstruction, prolonged ileus  Patient Measurements: Height: 5\' 11"  (180.3 cm) Weight: 61.2 kg (134 lb 14.7 oz) IBW/kg (Calculated) : 75.3 TPN AdjBW (KG): 60.8 Body mass index is 18.82 kg/m. Usual Weight: 137 lbs  Assessment:  74 YOM presented 2020-12-10 with nausea, vomiting, abdominal pain after leaving AMA at [facility] with known SBO. Patient has been on bowel rest with NG tube decompression. Patient continued to have persistent SBO on imaging with no evidence of improvement, now s/p ex-lap with LoA, SBR and placement of Gtube on 12/02/2020. Pharmacy consulted for TPN management and patient received TPN 11/19/20 through 11/29/20.  12/02/20: Re-Consulted: Patient has been on tube feeds at goal since 11/29/20. However, patient is having significantly high ostomy output and it all appears to be tube feeds so not absorbing and patient is hypernatremic. Patient on loperamide and fiber without improvement in output. Pharmacy re-consulted to restart TPN.     Glucose / Insulin: no hx DM - CBGs <180, used 1 unit SSI last 24 hrs (continue with plans to resume TPN) Electrolytes: Na/CL high - on FW at 300 q8h + D5W at 50 ml/hr, K 3.8; last Phos/Mg on 12/18 were normal Renal: SCr 1.21, BUN 61  -stable LFTs / TGs: LFTs / TG WNL, tbili down 1.6 Prealbumin / albumin: BL prealbumin low 6.7>5.5, albumin 1.4 Intake / Output; MIVF: UOP 1 ml/kg/hr, colostomy output 20 mL, ileostomy output 3375 mL, net +2.8L (significantly down); D5W at 50 ml/hr. GI Imaging:  12/11 DG Abd - stomach gas-filled, without distention, no obvious free air Surgeries / Procedures:  12/12: re-exploration and washout with new copious drainage from incision concerning for enteric leak vs. enterotomy >> now s/p, bowel necrosis resected 12/15: ex-lap with ileostomy with fistula creation, abdominal closure  Central access: PICC placed 12/08/2020 TPN start date:  11/19/20>>11/29/20; restart 12/03/20 >>  Nutritional Goals (per RD rec 12/17): 2100-2300 kCal, 120-135g protein, >2L fluid per day  Prior Goal TPN: 90 ml/hr, providing 121g AA, 389g CHO, and 31g SMOF lipids (15% of total kCal due to [employer]), for a total of 2120 kCal, meeting 100% of needs.  Current Nutrition:  TF, D5W at 50 ml/hr  Plan:  TPN ordered to resume after institution specified cut-off time of 1230.  Will resume TPN on 12/04/19.  Continue D5W at 50 ml/hr for now.  TPN labs entered.   Sloan Leiter, PharmD, BCPS, BCCCP Clinical Pharmacist Please refer to [facility] for [place] numbers 12/02/2020, 2:30 PM

## 2020-12-02 NOTE — Progress Notes (Signed)
NAME:  Ronald Wolf, MRN:  [id number], DOB:  1946-03-28, LOS: 61 ADMISSION DATE:  11/25/2020, CONSULTATION DATE:  12/02/2020 REFERRING MD: Cyndia Skeeters CHIEF COMPLAINT:  hypotension   Brief History   Ronald Wolf is a 74 yo M w/ PMH of CVA, Seizures, Dementia, Bladder ca s/p urostomy, tobacco use, right glottic carcinoma s/p excision presenting with sbo. Found to have ischemic bowel requiring ex-lap. PCCM consulted due to post-op hypotension / respiratory distress  History of present illness   Ronald Wolf is a 74 yo M w/ above PMH who initially presented to [facility] hospital on 11/11/20 with abdominal pain and nausea of 4-5 day duration. He was found to have SBO but left against medical advice as his family did not feel comfortable with him receiving his medical care in [place]. He came to [facility] ED the next day with similar complaint and was found to have SBO. He was treated with supportive care with bowel rest and ng suction. However his symptoms did not improve. Surgery team was consulted and he was brough to OR for exploratory laparoscopy. He was found to have multiple sections of ischemic bowel which were resected. He was unable to be extubated post-operatively and PCCM was consulted.  Past Medical History  CVA, Dementia, Bladder CA s/p nephrostomy, tobacco use, right glottic carcinoma s/p excision, seizures.  Significant Hospital Events   12/09/2020 Admission 11/27/2020 OR 11/17/2020 Admit to ICU 12/10 Brief SVT with associated hypertension. Self limited Given fentanyl, metop and hydral for HTN 12/11 Good urine output with Lasix , failed extubation, reintubated within 2 hours due to inability to clear secretions 12/12 return OR for enteric leak-- found to have bowel necrosis. Left in discontinuity  12/13 self extubated, reintubated. On pressors. Family meeting with PCCM and CCS to discuss possible next steps.  12/15-was back in OR, abdominal closure, RUQ mucus fistula and L ileostomy  12/19: No  significant overnight events, high ileostomy output 12/21: scheduled for family meeting. Worse hypernatremia, continued high ileostomy output   Consults:  General Surgery PCCM  Procedures:  12/10/2020 Ex-lap, G-tube placement ETT 12/7 >> 12/11, >> 12/15- back to OR for abdominal closure, exploratory laparotomy   Significant Diagnostic Tests:   CT abd/pelvis 12/2 > High-grade distal small bowel obstruction, possibly due to adhesion. Mild ascites and diffuse mesenteric edema. No evidence of focal inflammatory process or abscess. Small bilateral pleural effusions and mild right lower lobe Atelectasis.  CXR 12/20> PICC stable, ETT stable. Bibasilar infiltrates/edema without interval change   Micro Data:  11/13/2020 COVID/Flu negative 12/7 BA: Few GNR rare GPC -- predominantly PMN  12/7 BCx> NGTD  12/11 respiratory -no organism in tracheal aspirate  Antimicrobials:  Fluc 12/8>> 12/10 Zosyn 12/8> 12/12  Interim history/subjective:   Worse Na (158) despite increases in FWF yesterday -- aren't absorbing   Attempted PSV/CPAP wean, pulled volumes of high 100s -low/mid 200s with tachypnea. Placed back of full support   Shakes head yes when asked if is in pain -- not sure that he is really understanding however as he shakes head no when asked if his name is Ronald Wolf.   Objective   Blood pressure 123/71, pulse (!) 103, temperature 98.4 F (36.9 C), temperature source Oral, resp. rate (!) 27, height 5\' 11"  (1.803 m), weight 55.6 kg, SpO2 100 %.    Vent Mode: PRVC FiO2 (%):  [30 %] 30 % Set Rate:  [15 bmp] 15 bmp Vt Set:  [450 mL] 450 mL PEEP:  [5 cmH20] 5 cmH20 Pressure  Support:  [12 cmH20] 12 cmH20 Plateau Pressure:  [15 cmH20-16 cmH20] 15 cmH20   Intake/Output Summary (Last 24 hours) at 12/02/2020 0710 Last data filed at 12/02/2020 0600 Gross per 24 hour  Intake 2329.88 ml  Output 4720 ml  Net -2390.12 ml   Filed Weights   11/30/20 0300 12/01/20 0455 12/02/20 0500  Weight: 65.2 kg  60.3 kg 55.6 kg    Examination: Gen: Critically and chronically ill, frail, thin, elderly M. Intubated, lightly sedated NAD  HEENT: Temporal muscle wasting. Pink dry mm. Anicteric sclera. ETT NGT secure  Lungs: Symmetrical chest expansion, cta. Shallow rapid respirations when on PSV/CPAP.  CV: tachycardic rate reg rhythm. s1s2 no rgm cap refill < 3 sec  Abd: Midline abdominal incision without purulence, bleeding. G tube has thick green/tan secretions around insertion site. RUQ mucus fistula is dusky appearing. L ileostomy with yellow output  GU: R urostomy collecting urine  Ext: Symmetrically decreased muscle mass BUE BLE. No obvious joint deformity. No cyanosis or clubbing. Skin: c/d/w-- abdominal sites as above  Neuro: Awake, alert. Disoriented. Following some commands intermittently, moves BUE BLE spontaneously    Resolved Hospital Problem list   AKI   Assessment & Plan:   Acute encephalopathy, multifactorial  -Minimize sedation -RASS goal of 0 to -1  Acute hypoxic respiratory failure requiring mechanical ventilation, vdrf  Has been intubated x3 during this hospitalization CXR with bibasilar infiltrates vs edema -- last tracheal aspirate 12/11 with no orgs  Pleural effusions, improved  Secondary to malnutrition, low albumin P -Has failed extubation multiple times, mentation ongoing barrier for weaning criteria, and now is significantly more debilitated than for prior attempts - do not anticipate will be able  -If family wishes, will need tracheostomy. (Has essentially been intubated since 12/7.    SBO Ischemic bowel perforation  -s/p 2 ex laps, resection of ischemic bowel, mucus fistula and ileostomy, G tube placement  -12/21 some green/tan drainage around G tube  P -CCS following -- will ask to evaluate Gtube  -Continues to have pretty poor EN tolerance -Pending family mtg 12/21, likely restart TPN   Hypernatremia -suspect hypovolemic -has been volume down x several  shifts. Significant gi output.  P -bolus LR (is volume down), will follow  -increasing FWF again, but worry this is not absorbing  -d5W gtt 86ml/hr (approx 28ml/kg/hr) -continue to trend  HTN SVT, improved AFib RVR -Continue scheduled metop  -PRN hydral  -Not a safe candidate for anticoagulation  Severe protein calorie malnutrition -Enteric nutrition -CCS recs restarting TPN due to high ostomy outpt and poor EN tolerance. Will address with family during mtg 12/21   Hx seizure -On Keppra  History of bladder cancer -continue urostomy care  Anemia -Transfuse per protocol  Goals of Care -poor baseline due to multiple medical problems, post emergent surgery with slow recovery, failed extubations (12/11, 12/13) due to general deconditioning and inability to clear secretions.  -12/13 Family discussion with CCS and PCCM. Family has decided to pursue all offered aggressive interventions (return OR for abd, trach, full code) -Palliative care consult appreciated for ongoing GOC discussions -Family meeting is scheduled 12/02/20 -- need to determine if wants tracheostomy + ongoing aggressive medical management  -Guarded prognosis   Best practice (evaluated daily)  Diet: EN Pain/Anxiety/Delirium protocol (if indicated): Fent, precedex VAP protocol (if indicated): Yes  DVT prophylaxis: SQH  GI prophylaxis: PPI Glucose control: SSI  Mobility:BR  Family updated - please see lengthy documentation 12/13 RE multidisciplinary GOC.  Family meeting is  scheduled with palliative care 12/02/20  Code Status: Full Disposition: ICU  Labs   CBC: Recent Labs  Lab 11/28/20 0646 11/29/20 0452 11/30/20 0419 12/01/20 0506 12/02/20 0510  WBC 26.3* 25.5* 27.7* 24.1* 18.0*  NEUTROABS  --   --  25.2* 19.2*  --   HGB 7.3* 8.2* 7.4* 7.7* 8.9*  HCT 24.7* 26.1* 25.7* 25.7* 28.6*  MCV 106.9* 104.4* 109.8* 105.3* 104.0*  PLT 208 252 292 346 194    Basic Metabolic Panel: Recent Labs  Lab  11/25/20 2105 11/14/2020 1039 11/30/2020 2106 11/27/20 0810 11/28/20 0646 11/29/20 0452 11/30/20 0419 12/01/20 0506 12/02/20 0510  NA 140 141   < > 143 145 146* 150* 152* 158*  K 4.7 4.8   < > 5.0 4.7 4.1 3.9 3.7 3.8  CL 108 108  --  110 110 113* 118* 122* 128*  CO2 24 25  --  25 29 26 22  20* 20*  GLUCOSE 167* 334*  --  181* 189* 192* 127* 116* 99  BUN 52* 51*  --  55* 63* 63* 67* 63* 61*  CREATININE 1.21 1.13  --  1.11 1.13 1.08 1.23 1.13 1.21  CALCIUM 7.9* 7.8*  --  8.2* 8.2* 8.1* 8.3* 8.2* 8.3*  MG 2.3 2.4  --  2.2 2.2 2.2  --   --   --   PHOS  --   --   --  3.1  --  3.0  --   --   --    < > = values in this interval not displayed.   GFR: Estimated Creatinine Clearance: 42.1 mL/min (by C-G formula based on SCr of 1.21 mg/dL). Recent Labs  Lab 11/29/20 0452 11/30/20 0419 12/01/20 0506 12/02/20 0510  WBC 25.5* 27.7* 24.1* 18.0*    Liver Function Tests: Recent Labs  Lab 11/27/20 0810  AST 26  ALT 21  ALKPHOS 83  BILITOT 1.6*  PROT 4.7*  ALBUMIN 1.4*   No results for input(s): LIPASE, AMYLASE in the last 168 hours. No results for input(s): AMMONIA in the last 168 hours.  ABG    Component Value Date/Time   PHART 7.361 12/06/2020 2113   PCO2ART 47.6 12/09/2020 2113   PO2ART 41 (L) 12/06/2020 2113   HCO3 26.9 11/14/2020 2113   TCO2 28 12/12/2020 2113   ACIDBASEDEF 2.0 12/07/2020 1658   O2SAT 72.0 11/28/2020 2113     Coagulation Profile: No results for input(s): INR, PROTIME in the last 168 hours.  Cardiac Enzymes: No results for input(s): CKTOTAL, CKMB, CKMBINDEX, TROPONINI in the last 168 hours.  HbA1C: Hgb A1c MFr Bld  Date/Time Value Ref Range Status  11/19/2020 10:37 AM 5.0 4.8 - 5.6 % Final    Comment:    (NOTE) Pre diabetes:          5.7%-6.4%  Diabetes:              >6.4%  Glycemic control for   <7.0% adults with diabetes   12/17/2019 08:19 AM 4.5 (L) 4.8 - 5.6 % Final    Comment:    (NOTE) Pre diabetes:          5.7%-6.4% Diabetes:               >6.4% Glycemic control for   <7.0% adults with diabetes    CRITICAL CARE Performed by: Cristal Generous   Total critical care time: 38 minutes  Critical care time was exclusive of separately billable procedures and treating other patients. Critical care was  necessary to treat or prevent imminent or life-threatening deterioration.  Critical care was time spent personally by me on the following activities: development of treatment plan with patient and/or surrogate as well as nursing, discussions with consultants, evaluation of patient's response to treatment, examination of patient, obtaining history from patient or surrogate, ordering and performing treatments and interventions, ordering and review of laboratory studies, ordering and review of radiographic studies, pulse oximetry and re-evaluation of patient's condition.  Eliseo Gum MSN, AGACNP-BC [place] [phone] If no answer, [phone] 12/02/2020, 7:11 AM

## 2020-12-02 NOTE — Progress Notes (Signed)
Nutrition Follow-up  DOCUMENTATION CODES:   Severe malnutrition in context of chronic illness  INTERVENTION:   TPN to resume to meet nutrition goals  Tube feeding via G-tube: Osmolite 1.5 at 65 ml/h (1560 ml per day) Prosource TF 45 ml TID  Provides 2460 kcal, 130 gm protein, 1185 ml free water daily 300 ml free water every 8 hours Total free water: 2085 ml   NUTRITION DIAGNOSIS:   Severe Malnutrition related to chronic illness as evidenced by severe fat depletion,severe muscle depletion. Ongoing.   GOAL:   Patient will meet greater than or equal to 90% of their needs Meeting with nutrition support   MONITOR:   I & O's  REASON FOR ASSESSMENT:   Consult Enteral/tube feeding initiation and management  ASSESSMENT:   74 year old male with history of cognitive impairment, CVA, HTN, bladder cancer now with urostomy, right-sided glottic carcinoma s/p excision brought to ED with nausea, vomiting and abdominal pain.  Work-up in ED revealed small bowel obstruction.  He also had AKI and lactic acidosis.  Started on bowel rest with NG tube decompression and IV fluid.  General surgery following.  Subsequent x-rays with persistent SBO.  Pt discussed during ICU rounds and with RN.  Pt with >3 L of output, spoke with MD will start TPN; pt with malabsorption of enteral nutrition.  Palliative care meeting today; plan for full scope of care   12/7 s/p ex lap, extensive LOA, SBR, g-tube placement; s/p bronch  12/10 TPN advanced to goal rate - provides: 2120 kcal and 121 grams protein  12/12 back to OR for re-exploration and washout with new copious drainage from incision; pt s/p reopening of recent laparotomy, SBR without reanastomosis, abd washout, placement of VAC 12/15 s/p ex lap, ileostomy, mucous fistula, and closure 12/18 TPN d/c'ed; TF at goal rate   Patient is currently intubated on ventilator support MV: 12.4 L/min Temp (24hrs), Avg:98.7 F (37.1 C), Min:98.3 F (36.8  C), Max:99.3 F (37.4 C)  Medications reviewed and include: folic acid, SSI, imodium, MVI with minerals  Precedex    Labs reviewed: Na 158 CBG's: 99-93-127  16 F NG tube:  50 ml  18 F G-tube: 150 ml  Ileostomy: 3375 ml Mucous fistula: 20 ml  Current TF:  Pivot 1.5 @ 60 ml/h Provides: 2160 kcal and 135 grams protein   Diet Order:   Diet Order            Diet NPO time specified  Diet effective now                 EDUCATION NEEDS:   No education needs have been identified at this time  Skin:  Skin Assessment: Reviewed RN Assessment  Last BM:  3375 ml via ileostomy  Height:   Ht Readings from Last 1 Encounters:  11/22/20 5\' 11"  (1.803 m)    Weight:   Wt Readings from Last 1 Encounters:  12/02/20 55.6 kg    Ideal Body Weight:  78.2 kg  BMI:  Body mass index is 17.1 kg/m.  Estimated Nutritional Needs:   Kcal:  2300-2700  Protein:  120-135 grams  Fluid:  > 2 L  Jahi Roza P., RD, LDN, CNSC See AMiON for contact information

## 2020-12-02 NOTE — Progress Notes (Addendum)
Palliative:  HPI: 74 yo male with past medical history significant of bladder cancer s/p urostomy, seizure disorder, right glottic cancer s/p excisinon, stroke, vascular dementia, atrial fibrillation, tobacco abuse, hep C, anxiety, depression admitted 11/22/2020 with small bowel obstruction and ischemic bowel. Initially seen at [facility] but left AMA and came to [facility] following day. S/P exploratory laparoscopy and bowel resection 12/7, further bowel resection 12/12 and found to have bowel necrosis, and abd closure and ileostomy 12/15. Hospitalization complicated by recurrent respiratory failure and intubated x 3. Overall adult failure to thrive.   I met today with Ronald Wolf daughter, Ronald Wolf, and sister, Ronald Wolf, via speaker phone along with Ronald Gum, NP PCCM. We reviewed Ronald Wolf's underlying debility and decline over the past year. We discussed his poor progress and poor prognosis. Explained aggressive path and expectations with tracheostomy vs moving towards liberalization from ventilator and comfort focused care. After discussion both [place] and Ronald Wolf share that family wish to move forward with tracheostomy and aggressive care.   We also discussed code status and recommended consideration of DNR status. Family would like to think and discuss this further. Overall they feel they need more time. I recommended that they continue to discuss as a family as time goes on if this is still the right decision for [facility]. We also discussed following his lead and if he is showing even small improvements we follow this but if he continues to have decline and further complications we should reconsider plan of care.   All questions/concerns addressed. Emotional support provided.   Exam: Frail, cachectic. Alert and nods head yes/no but not consistently. Does not follow commands for me today (noted to follow simple commands at times). Tolerating vent but poor weaning. Copious stool output with tube feeding.    Plan: - Continue to pursue aggressive care and tracheostomy.  - Family considering code status.   [id number] 60 min  Ronald Sill, NP Palliative Medicine Team Pager [phone] (Please see [email or web address] for schedule) Team Phone [phone]    Greater than 50%  of this time was spent counseling and coordinating care related to the above assessment and plan

## 2020-12-03 DIAGNOSIS — Z9911 Dependence on respirator [ventilator] status: Secondary | ICD-10-CM | POA: Diagnosis not present

## 2020-12-03 DIAGNOSIS — E870 Hyperosmolality and hypernatremia: Secondary | ICD-10-CM | POA: Diagnosis not present

## 2020-12-03 DIAGNOSIS — J9601 Acute respiratory failure with hypoxia: Secondary | ICD-10-CM | POA: Diagnosis not present

## 2020-12-03 LAB — PHOSPHORUS: Phosphorus: 3.5 mg/dL (ref 2.5–4.6)

## 2020-12-03 LAB — CBC
HCT: 24.8 % — ABNORMAL LOW (ref 39.0–52.0)
Hemoglobin: 7.6 g/dL — ABNORMAL LOW (ref 13.0–17.0)
MCH: 32.6 pg (ref 26.0–34.0)
MCHC: 30.6 g/dL (ref 30.0–36.0)
MCV: 106.4 fL — ABNORMAL HIGH (ref 80.0–100.0)
Platelets: 368 10*3/uL (ref 150–400)
RBC: 2.33 MIL/uL — ABNORMAL LOW (ref 4.22–5.81)
RDW: 16.5 % — ABNORMAL HIGH (ref 11.5–15.5)
WBC: 23.5 10*3/uL — ABNORMAL HIGH (ref 4.0–10.5)
nRBC: 0.3 % — ABNORMAL HIGH (ref 0.0–0.2)

## 2020-12-03 LAB — GLUCOSE, CAPILLARY
Glucose-Capillary: 109 mg/dL — ABNORMAL HIGH (ref 70–99)
Glucose-Capillary: 122 mg/dL — ABNORMAL HIGH (ref 70–99)
Glucose-Capillary: 129 mg/dL — ABNORMAL HIGH (ref 70–99)
Glucose-Capillary: 150 mg/dL — ABNORMAL HIGH (ref 70–99)
Glucose-Capillary: 153 mg/dL — ABNORMAL HIGH (ref 70–99)
Glucose-Capillary: 156 mg/dL — ABNORMAL HIGH (ref 70–99)

## 2020-12-03 LAB — COMPREHENSIVE METABOLIC PANEL
ALT: 24 U/L (ref 0–44)
AST: 28 U/L (ref 15–41)
Albumin: 1.4 g/dL — ABNORMAL LOW (ref 3.5–5.0)
Alkaline Phosphatase: 152 U/L — ABNORMAL HIGH (ref 38–126)
Anion gap: 7 (ref 5–15)
BUN: 58 mg/dL — ABNORMAL HIGH (ref 8–23)
CO2: 19 mmol/L — ABNORMAL LOW (ref 22–32)
Calcium: 8.3 mg/dL — ABNORMAL LOW (ref 8.9–10.3)
Chloride: 127 mmol/L — ABNORMAL HIGH (ref 98–111)
Creatinine, Ser: 1.49 mg/dL — ABNORMAL HIGH (ref 0.61–1.24)
GFR, Estimated: 49 mL/min — ABNORMAL LOW (ref >60)
Glucose, Bld: 166 mg/dL — ABNORMAL HIGH (ref 70–99)
Potassium: 4.2 mmol/L (ref 3.5–5.1)
Sodium: 153 mmol/L — ABNORMAL HIGH (ref 135–145)
Total Bilirubin: 0.9 mg/dL (ref 0.3–1.2)
Total Protein: 5.8 g/dL — ABNORMAL LOW (ref 6.5–8.1)

## 2020-12-03 LAB — DIFFERENTIAL
Abs Immature Granulocytes: 0.57 10*3/uL — ABNORMAL HIGH (ref 0.00–0.07)
Basophils Absolute: 0.1 10*3/uL (ref 0.0–0.1)
Basophils Relative: 0 %
Eosinophils Absolute: 0.1 10*3/uL (ref 0.0–0.5)
Eosinophils Relative: 0 %
Immature Granulocytes: 2 %
Lymphocytes Relative: 13 %
Lymphs Abs: 3 10*3/uL (ref 0.7–4.0)
Monocytes Absolute: 1.8 10*3/uL — ABNORMAL HIGH (ref 0.1–1.0)
Monocytes Relative: 8 %
Neutro Abs: 18 10*3/uL — ABNORMAL HIGH (ref 1.7–7.7)
Neutrophils Relative %: 77 %

## 2020-12-03 LAB — PREALBUMIN: Prealbumin: 14.6 mg/dL — ABNORMAL LOW (ref 18–38)

## 2020-12-03 LAB — TRIGLYCERIDES: Triglycerides: 83 mg/dL (ref <150)

## 2020-12-03 LAB — MAGNESIUM: Magnesium: 2.5 mg/dL — ABNORMAL HIGH (ref 1.7–2.4)

## 2020-12-03 MED ORDER — FENTANYL CITRATE (PF) 100 MCG/2ML IJ SOLN: 25.0000 ug | INTRAMUSCULAR | Status: DC | PRN

## 2020-12-03 MED ORDER — TRAVASOL 10 % IV SOLN
INTRAVENOUS | Status: AC
  Filled 2020-12-03: qty 480

## 2020-12-03 MED ORDER — FENTANYL CITRATE (PF) 100 MCG/2ML IJ SOLN
25.0000 ug | INTRAMUSCULAR | Status: DC | PRN
  Administered 2020-12-03 – 2020-12-04 (×4): 100 ug via INTRAVENOUS
  Administered 2020-12-04: 50 ug via INTRAVENOUS
  Administered 2020-12-04 (×4): 100 ug via INTRAVENOUS
  Administered 2020-12-10: 50 ug via INTRAVENOUS
  Administered 2020-12-13 (×6): 100 ug via INTRAVENOUS
  Administered 2020-12-13 – 2020-12-14 (×2): 50 ug via INTRAVENOUS
  Administered 2020-12-14 – 2020-12-15 (×3): 100 ug via INTRAVENOUS
  Administered 2020-12-15: 50 ug via INTRAVENOUS
  Administered 2020-12-16: 75 ug via INTRAVENOUS
  Administered 2020-12-16 – 2020-12-20 (×2): 25 ug via INTRAVENOUS
  Administered 2020-12-20 – 2020-12-28 (×3): 50 ug via INTRAVENOUS
  Administered 2020-12-28: 25 ug via INTRAVENOUS
  Administered 2020-12-29 (×4): 50 ug via INTRAVENOUS
  Administered 2020-12-30 – 2021-01-08 (×7): 100 ug via INTRAVENOUS
  Filled 2020-12-03 (×42): qty 2

## 2020-12-03 NOTE — Progress Notes (Signed)
Palliative:  HPI: 74 yo male with past medical history significant of bladder cancer s/p urostomy, seizure disorder, right glottic cancer s/p excisinon, stroke, vascular dementia, atrial fibrillation, tobacco abuse, hep C, anxiety, depression admitted 12/08/2020 with small bowel obstruction and ischemic bowel. Initially seen at [facility] but left AMA and came to [facility] following day. S/P exploratory laparoscopy and bowel resection 12/7, further bowel resection 12/12 and found to have bowel necrosis, and abd closure and ileostomy 12/15. Hospitalization complicated by recurrent respiratory failure and intubated x 3. Overall adult failure to thrive.    I met today at [place] bedside. He is sleepy today and less alert and responsive compared to yesterday. He is weaning for now with RR 30. No family at bedside.   I called to follow up with family. Daughter, Derrick Ravel, does not answer so I left voicemail. I was able to reach sister, Helene Kelp, and discussed with her. She has no specific questions regarding conversation yesterday. She asked questions about his current status and labs, etc. Her main concern is that Carsen's 3 children and 2 sisters are coming from out of town and she is asking about any changes in visitation over Christmas so that they can have a visit with him. I did inform her that this would not be my decision but up to administration and that nurse can discuss with unit leadership.   I also asked if they have had a chance to discuss code status. Helene Kelp shares with me that they plan to discuss further when all family arrive this weekend.   All questions/concerns addressed. Emotional support provided.   Exam: Lethargic. Not following commands. Tolerating pressure support on vent. Thin, frail, cachectic. Abd with ostomy bags and dressing clean, dry, intact.   Plan: - Continue to pursue aggressive care and tracheostomy.  - Family considering code status.   25 min  Vinie Sill,  NP Palliative Medicine Team Pager [phone] (Please see [email or web address] for schedule) Team Phone [phone]    Greater than 50%  of this time was spent counseling and coordinating care related to the above assessment and plan

## 2020-12-03 NOTE — Progress Notes (Signed)
PHARMACY - TOTAL PARENTERAL NUTRITION CONSULT NOTE  Indication: Small bowel obstruction, prolonged ileus  Patient Measurements: Height: 5\' 11"  (180.3 cm) Weight: 61.2 kg (134 lb 14.7 oz) IBW/kg (Calculated) : 75.3 TPN AdjBW (KG): 60.8 Body mass index is 18.82 kg/m. Usual Weight: 137 lbs  Assessment:  74 YOM presented 12/09/2020 with nausea, vomiting, abdominal pain after leaving AMA at [facility] with known SBO. Patient has been on bowel rest with NG tube decompression. Patient continued to have persistent SBO on imaging with no evidence of improvement, now s/p ex-lap with LoA, SBR and placement of Gtube on 12/02/2020. Pharmacy consulted for TPN management and patient received TPN 11/19/20 through 11/29/20.  Reconsulted to resume TPN on 12/21 despite tube feeds at goal rate due to high ostomy output and concern for tube feeds not absorbing. Patient on loperamide and fiber without improvement in output.   Glucose / Insulin: no hx DM - CBGs <180, used 2 unit SSI last 24 hrs Electrolytes: Na 153 and Cl 127 - both high, on FW at 300 q8h + D5W at 50 ml/hr, K 4.2, Mg slightly high at 2.5, Phos wnl, CoCa 10.4 Renal: SCr up to 1.49 << 1.21, BUN 58  -stable LFTs / TGs: LFTs / TG WNL, tbili down 1.6 Prealbumin / albumin: Pre-albumin 14.6 (12/22) up from 5.5 on 12/13, alb 1.4  Intake / Output; MIVF: UOP 0.7 ml/kg/hr, colostomy output 10 mL, ileostomy output 4400 mL, net -5.7 (significantly down); D5W at 50 ml/hr. GI Imaging:  12/11 DG Abd - stomach gas-filled, without distention, no obvious free air Surgeries / Procedures:  12/12: re-exploration and washout with new copious drainage from incision concerning for enteric leak vs. enterotomy >> now s/p, bowel necrosis resected 12/15: ex-lap with ileostomy with fistula creation, abdominal closure  Central access: PICC placed 11/24/2020 TPN start date: 11/19/20>>11/29/20; restart 12/03/20 >>  Nutritional Goals (updated per RD rec 12/21): 2300-2700 kCal,  120-135g protein, >2L fluid per day  Goal TPN rate of 100 cc/hr will provide 120g AA, 432g CHO, and 36g lipids (15% of total kCal due to [employer]) for a total of 2308 kcal, meeting 100% of needs  Current Nutrition:  Osmolite 1.5 at 65 ml/h (1560 ml per day) Prosource TF 45 ml TID  D5W at 50 ml/hr  Plan:  - Start TPN at a rate of 40 cc/hr at 1800 today to provide 48g protein, 173g CHO, 14.4g lipid, and 923 kcal meeting ~40% of estimated needs - Electrolytes in TPN: reduce to 40 mEq/L of Na, 14mEq/L of K, reduce to 3 mEq/L of Ca, reduce to 3 mEq/L of Mg, and 4mmol/L of Phos., adjust Cl:Ac ratio to max acetate - Add standard MVI and trace elements to TPN; will also add FA to TPN an d/c per tube order - Continue with sensitive SSI q4h as already ordered - will make adjustments as needed - Continue D5W at 50 cc/hr and FW 300 PT q8h for now given high ileostomy output and fluid requirements - Monitor TPN labs on Mon/Thurs - Will follow along with plans - right now it is to continue full tube feeds and await reduction in output and bowel reabsorption while titrating up TPN to goal rate per discussion with RD 12/22  Thank you for allowing pharmacy to be a part of this patient's care.  Alycia Rossetti, PharmD, BCPS Clinical Pharmacist Clinical phone for 12/03/2020: [phone] 12/03/2020 9:10 AM   **Pharmacist phone directory can now be found on [email or web address] ([email or web address]).  Listed under [facility]  Pharmacy.

## 2020-12-03 NOTE — Progress Notes (Addendum)
7 Days Post-Op   Subjective/Chief Complaint: Pt with no acute changes overnight   Objective: Vital signs in last 24 hours: Temp:  [97.7 F (36.5 C)-99.7 F (37.6 C)] 98.2 F (36.8 C) (12/22 0400) Pulse Rate:  [88-127] 90 (12/22 0700) Resp:  [24-32] 26 (12/22 0700) BP: (112-167)/(68-93) 157/79 (12/22 0700) SpO2:  [99 %-100 %] 100 % (12/22 0700) FiO2 (%):  [30 %] 30 % (12/22 0454) Weight:  [55.9 kg] 55.9 kg (12/22 0500) Last BM Date: 12/02/20  Intake/Output from previous day: 12/21 0701 - 12/22 0700 In: 2970.3 [I.V.:583.5; NG/GT:2386.8] Out: 5360 [Urine:950; Stool:4410] Intake/Output this shift: No intake/output data recorded.  PE: General appearance: no distress Resp: clear to auscultation bilaterally Cardio: regular rate and rhythm GI: soft, GT in place, ileostomy with output looks like TF, mucous fistula pinker Neurologic: Mental status: awake on vent but did not F/C  Lab Results:  Recent Labs    12/02/20 0510 12/03/20 0502  WBC 18.0* 23.5*  HGB 8.9* 7.6*  HCT 28.6* 24.8*  PLT 339 368   BMET Recent Labs    12/02/20 0510 12/03/20 0502  NA 158* 153*  K 3.8 4.2  CL 128* 127*  CO2 20* 19*  GLUCOSE 99 166*  BUN 61* 58*  CREATININE 1.21 1.49*  CALCIUM 8.3* 8.3*    Studies/Results: DG CHEST PORT 1 VIEW  Result Date: 12/02/2020 CLINICAL DATA:  Endotracheal tube placement EXAM: PORTABLE CHEST 1 VIEW COMPARISON:  Radiograph 12/01/2020 FINDINGS: *Endotracheal tube tip terminates in the mid trachea, 3 cm from the carina. *Transesophageal tube tip and side port terminate below the margin of imaging, beyond the GE junction. *Left upper extremity PICC terminates at the lower SVC/cavoatrial junction. Hazy opacity is again seen in the right lung base partially obscuring the right hemidiaphragm with milder opacity in the left lung base as well. Not significantly changed from comparison exam. No pneumothorax. No pneumothorax or visible effusion. Stable cardiomediastinal  contours with a calcified aorta. No acute osseous or soft tissue abnormality. Degenerative changes are present in the imaged spine and shoulders. IMPRESSION: 1. Endotracheal tube 3 cm from the carina. 2. Transesophageal tube tip and side port terminate below the margin of imaging, beyond the GE junction. 3. Left upper extremity PICC at the superior cavoatrial junction. 4. Bibasilar opacities, similar to prior. 5.  Aortic Atherosclerosis (ICD10-I70.0). Electronically Signed   By: Kreg Shropshire M.D.   On: 12/02/2020 05:38    Anti-infectives: Anti-infectives (From admission, onward)   Start     Dose/Rate Route Frequency Ordered Stop   11/18/2020 0945  cefoTEtan (CEFOTAN) 2 g in sodium chloride 0.9 % 100 mL IVPB  Status:  Discontinued        2 g 200 mL/hr over 30 Minutes Intravenous To Surgery 11/23/2020 0936 11/16/2020 1057   12/02/2020 0900  cefoTEtan (CEFOTAN) 2 g in sodium chloride 0.9 % 100 mL IVPB  Status:  Discontinued        2 g 200 mL/hr over 30 Minutes Intravenous  Once 11/12/2020 0833 12/06/2020 0936   11/19/20 1130  piperacillin-tazobactam (ZOSYN) IVPB 3.375 g        3.375 g 12.5 mL/hr over 240 Minutes Intravenous Every 8 hours 11/19/20 1017 12/06/2020 2359   11/19/20 1115  fluconazole (DIFLUCAN) IVPB 200 mg        200 mg 100 mL/hr over 60 Minutes Intravenous Every 24 hours 11/19/20 1017 11/21/20 1104   11/19/20 0915  Ampicillin-Sulbactam (UNASYN) 3 g in sodium chloride 0.9 % 100 mL IVPB  Status:  Discontinued        3 g 200 mL/hr over 30 Minutes Intravenous Every 12 hours 11/19/20 0826 11/19/20 0949      Assessment/Plan: SBO -s/p ex lap with SBR, placement of a gastrostomy tube12/7 by Dr. Georgette Dover. Takeback 12/12 by Dr. Zenia Resides for necrotic SB with resection of additional 60-70cm of SB inclusive of prior anastomosis in discontinuity with open abdomen. - S/P ex lap, ileostomy, mucous fistula, closure 12/15 by Dr. Grandville Silos Malnourishment - cont' TNA/TFs VDRF- self-extubated 12/13 AM, re-intubated  ~3h after. Second time failing extubation, vent settings back down to normal, per CCM Shock/ID-resolved;septic, completed 5d zosyn, necrotic bowel was most likely source. Pressorsare off CV/AF RVR- in NSRcurrently FEN- advance TF via G tube; not absorbing much TF, TNA, fiber added VTE-SQH Dispo - ICU Family wanting con't care.  Will need Trach likely.  CC time:80min   LOS: 21 days    Ralene Ok 12/03/2020

## 2020-12-03 NOTE — Progress Notes (Signed)
ETT tube pulled back to 26 at the lip per MD order.

## 2020-12-03 NOTE — Progress Notes (Signed)
NAME:  Ronald Wolf, MRN:  [id number], DOB:  02-24-1946, LOS: 21 ADMISSION DATE:  11/15/2020, CONSULTATION DATE:  12/04/2020 REFERRING MD: Cyndia Skeeters CHIEF COMPLAINT:  hypotension   Brief History   Ronald Wolf is a 74 yo M w/ PMH of CVA, Seizures, Dementia, Bladder ca s/p urostomy, tobacco use, right glottic carcinoma s/p excision presenting with sbo. Found to have ischemic bowel requiring ex-lap. PCCM consulted due to post-op hypotension / respiratory distress  History of present illness   Ronald Wolf is a 74 yo M w/ above PMH who initially presented to [facility] hospital on 11/11/20 with abdominal pain and nausea of 4-5 day duration. He was found to have SBO but left against medical advice as his family did not feel comfortable with him receiving his medical care in [place]. He came to [facility] ED the next day with similar complaint and was found to have SBO. He was treated with supportive care with bowel rest and ng suction. However his symptoms did not improve. Surgery team was consulted and he was brough to OR for exploratory laparoscopy. He was found to have multiple sections of ischemic bowel which were resected. He was unable to be extubated post-operatively and PCCM was consulted.  Past Medical History  CVA, Dementia, Bladder CA s/p nephrostomy, tobacco use, right glottic carcinoma s/p excision, seizures.  Significant Hospital Events   11/13/2020 Admission 12/04/2020 OR 11/21/2020 Admit to ICU 12/10 Brief SVT with associated hypertension. Self limited Given fentanyl, metop and hydral for HTN 12/11 Good urine output with Lasix , failed extubation, reintubated within 2 hours due to inability to clear secretions 12/12 return OR for enteric leak-- found to have bowel necrosis. Left in discontinuity  12/13 self extubated, reintubated. On pressors. Family meeting with PCCM and CCS to discuss possible next steps.  12/15-was back in OR, abdominal closure, RUQ mucus fistula and L ileostomy  12/19: No  significant overnight events, high ileostomy output 12/21: scheduled for family meeting. Worse hypernatremia, continued high ileostomy output.  Attempted PSV/CPAP wean, pulled volumes of high 100s -low/mid 200s with tachypnea. Placed back of full support.    Consults:  General Surgery PCCM  Procedures:  11/30/2020 Ex-lap, G-tube placement ETT 12/7 >> 12/11, >> 12/15- back to OR for abdominal closure, exploratory laparotomy   Significant Diagnostic Tests:   CT abd/pelvis 12/2 > High-grade distal small bowel obstruction, possibly due to adhesion. Mild ascites and diffuse mesenteric edema. No evidence of focal inflammatory process or abscess. Small bilateral pleural effusions and mild right lower lobe Atelectasis.  CXR 12/20> PICC stable, ETT stable. Bibasilar infiltrates/edema without interval change   Micro Data:  12/08/2020 COVID/Flu negative 12/7 BA: Few GNR rare GPC -- predominantly PMN  12/7 BCx> Neg 12/11 respiratory -no organism in tracheal aspirate  Antimicrobials:  Fluc 12/8>> 12/10 Zosyn 12/8> 12/12 cefotetain 12/15  Interim history/subjective:   Afebrile No events overnight  Reported to intermittent f/c Off fentanyl gtt this morning, remains on low dose precedex   Objective   Blood pressure (!) 157/79, pulse 90, temperature 98.2 F (36.8 C), temperature source Axillary, resp. rate (!) 26, height 5\' 11"  (1.803 m), weight 55.9 kg, SpO2 100 %.    Vent Mode: PRVC FiO2 (%):  [30 %] 30 % Set Rate:  [15 bmp] 15 bmp Vt Set:  [450 mL] 450 mL PEEP:  [5 cmH20] 5 cmH20 Plateau Pressure:  [15 cmH20-25 cmH20] 17 cmH20   Intake/Output Summary (Last 24 hours) at 12/03/2020 0725 Last data filed at 12/03/2020 0700  Gross per 24 hour  Intake 2970.34 ml  Output 5360 ml  Net -2389.66 ml   Filed Weights   12/01/20 0455 12/02/20 0500 12/03/20 0500  Weight: 60.3 kg 55.6 kg 55.9 kg    Examination: General:  Chronically ill/ cachetic elderly male lying in bed in NAD HEENT: MM  pink/moist, pupils 3/reactive, ETT at 27 cm at lip, NGT, temporal muscle wasting  Neuro: eyes open to verbal, tracks, but does not f/c for me, spont MAE- weakly  CV: rr, NSR, no murmur PULM:  Non labored, CTA, switched to PSV 12/5 -> tolerating thus far with TV  ~350- 400 GI: flat, midline abd dressing cdi, Gtube left abd site wnl, RUQ mucus fistual, left ileostomy with yellow output and right urostomy  Extremities: warm/dry, no edema  Skin: no rashes    No family at bedside  Labs reviewed.  Na 158- 153, CL 128-> 127, sCr 1.21-> 1.49, WBC 24.1-> 18-> 23.5, Hgb 7.7-> 8.9-> 7.6  Resolved Hospital Problem list   AKI   Assessment & Plan:   Acute encephalopathy, multifactorial  - Minimize sedation, d/c fentanyl gtt and change to PRN, continue precedex for RASS goal 0/-1 - ongoing neuro checks   Acute hypoxic respiratory failure requiring mechanical ventilation, vdrf  Has been intubated x3 during this hospitalization CXR with bibasilar infiltrates vs edema -- last tracheal aspirate 12/11 with no orgs  Pleural effusions, improved  - Secondary to malnutrition, low albumin -Has failed extubation multiple times, mentation ongoing barrier for weaning criteria, and now is significantly more debilitated than for prior attempts - do not anticipate will be able to be extubated  P Continue for full MV support Daily SBT trials- no extubation Plan for tracheostomy around 12/27 VAP bundle/ PPI  Retract ETT to 26  SBO Ischemic bowel perforation  -s/p 2 ex laps, resection of ischemic bowel, mucus fistula and ileostomy, G tube placement  -12/21 some green/tan drainage around G tube  P -CCS following - will remove NGT today per CCS  - concern that output from ileostomy is TF with poor absorption.  Re-starting TPN today - ongoing wound care   Hypernatremia AKI  -suspect hypovolemic 2/2 high GI output  P - continue D5W at 50 ml/hr and continue FWF (concern for not absorbing) - strict I/O's,  today remains neg 2.3 L / 24 hrs and overall net negative 6.4L  - may need to rebolus with LR   HTN SVT, improved AFib RVR -Continue scheduled metoprolol, remains normotensive -PRN hydral  -Not a safe candidate for anticoagulation  Severe protein calorie malnutrition -Enteric nutrition.  CCS re-starting TPN today due to high ostomy outpt and poor EN tolerance/ absorbtion.  - trend prealbumin levels   Hx seizure -On Keppra.  No clinical concerns of seizures.  Ongoing neuro monitoring   History of bladder cancer -continue urostomy care  Anemia -Transfuse per protocol.  H/H trend stable   Leukocytosis - remains afebrile.  Cultures negative to date.  Continue to clinically monitor off abx  Goals of Care -poor baseline due to multiple medical problems, post emergent surgery with slow recovery, failed extubations (12/11, 12/13) due to general deconditioning and inability to clear secretions.  -12/13 Family discussion with CCS and PCCM. Family has decided to pursue all offered aggressive interventions (return OR for abd, trach, full code) -Palliative care consult appreciated for ongoing [place] discussions -Family meeting held again 12/02/20 -- decided they want to proceed with tracheostomy (for more comfort) + ongoing aggressive medical management for  now with ongoing [place] discussions -Guarded prognosis given prior debility and ongoing clinical decline despite aggressive care  Best practice (evaluated daily)  Diet: EN/ adding TPN Pain/Anxiety/Delirium protocol (if indicated): Fent gtt-> prn, precedex VAP protocol (if indicated): Yes  DVT prophylaxis: SQH  GI prophylaxis: PPI Glucose control: SSI  Mobility:BR  Family updated - please see lengthy documentation 12/13 RE multidisciplinary GOC.  Family meeting- last 12/21; ongoing  Code Status: Full Disposition: ICU  Labs   CBC: Recent Labs  Lab 11/29/20 0452 11/30/20 0419 12/01/20 0506 12/02/20 0510 12/03/20 0502  WBC 25.5*  27.7* 24.1* 18.0* 23.5*  NEUTROABS  --  25.2* 19.2*  --  18.0*  HGB 8.2* 7.4* 7.7* 8.9* 7.6*  HCT 26.1* 25.7* 25.7* 28.6* 24.8*  MCV 104.4* 109.8* 105.3* 104.0* 106.4*  PLT 252 292 346 339 [id number]    Basic Metabolic Panel: Recent Labs  Lab 11/25/2020 1039 11/15/2020 2106 11/27/20 0810 11/28/20 0646 11/29/20 0452 11/30/20 0419 12/01/20 0506 12/02/20 0510 12/03/20 0502  NA 141   < > 143 145 146* 150* 152* 158* 153*  K 4.8   < > 5.0 4.7 4.1 3.9 3.7 3.8 4.2  CL 108  --  110 110 113* 118* 122* 128* 127*  CO2 25  --  25 29 26 22  20* 20* 19*  GLUCOSE 334*  --  181* 189* 192* 127* 116* 99 166*  BUN 51*  --  55* 63* 63* 67* 63* 61* 58*  CREATININE 1.13  --  1.11 1.13 1.08 1.23 1.13 1.21 1.49*  CALCIUM 7.8*  --  8.2* 8.2* 8.1* 8.3* 8.2* 8.3* 8.3*  MG 2.4  --  2.2 2.2 2.2  --   --   --  2.5*  PHOS  --   --  3.1  --  3.0  --   --   --  3.5   < > = values in this interval not displayed.   GFR: Estimated Creatinine Clearance: 34.4 mL/min (A) (by C-G formula based on SCr of 1.49 mg/dL (H)). Recent Labs  Lab 11/30/20 0419 12/01/20 0506 12/02/20 0510 12/03/20 0502  WBC 27.7* 24.1* 18.0* 23.5*    Liver Function Tests: Recent Labs  Lab 11/27/20 0810 12/03/20 0502  AST 26 28  ALT 21 24  ALKPHOS 83 152*  BILITOT 1.6* 0.9  PROT 4.7* 5.8*  ALBUMIN 1.4* 1.4*   No results for input(s): LIPASE, AMYLASE in the last 168 hours. No results for input(s): AMMONIA in the last 168 hours.  ABG    Component Value Date/Time   PHART 7.361 12/06/2020 2113   PCO2ART 47.6 12/09/2020 2113   PO2ART 41 (L) 12/05/2020 2113   HCO3 26.9 11/13/2020 2113   TCO2 28 12/07/2020 2113   ACIDBASEDEF 2.0 11/24/2020 1658   O2SAT 72.0 11/22/2020 2113     Coagulation Profile: No results for input(s): INR, PROTIME in the last 168 hours.  Cardiac Enzymes: No results for input(s): CKTOTAL, CKMB, CKMBINDEX, TROPONINI in the last 168 hours.  HbA1C: Hgb A1c MFr Bld  Date/Time Value Ref Range Status  11/19/2020  10:37 AM 5.0 4.8 - 5.6 % Final    Comment:    (NOTE) Pre diabetes:          5.7%-6.4%  Diabetes:              >6.4%  Glycemic control for   <7.0% adults with diabetes   12/17/2019 08:19 AM 4.5 (L) 4.8 - 5.6 % Final    Comment:    (  NOTE) Pre diabetes:          5.7%-6.4% Diabetes:              >6.4% Glycemic control for   <7.0% adults with diabetes    CRITICAL CARE Performed by: Kennieth Rad   Total critical care time: 36 minutes  Critical care time was exclusive of separately billable procedures and treating other patients. Critical care was necessary to treat or prevent imminent or life-threatening deterioration.  Critical care was time spent personally by me on the following activities: development of treatment plan with patient and/or surrogate as well as nursing, discussions with consultants, evaluation of patient's response to treatment, examination of patient, obtaining history from patient or surrogate, ordering and performing treatments and interventions, ordering and review of laboratory studies, ordering and review of radiographic studies, pulse oximetry and re-evaluation of patient's condition.  Kennieth Rad, ACNP [facility] 12/03/2020, 7:26 AM

## 2020-12-04 ENCOUNTER — Inpatient Hospital Stay (HOSPITAL_COMMUNITY): Payer: Medicare PPO

## 2020-12-04 DIAGNOSIS — R0902 Hypoxemia: Secondary | ICD-10-CM | POA: Diagnosis not present

## 2020-12-04 LAB — CBC
HCT: 24.2 % — ABNORMAL LOW (ref 39.0–52.0)
Hemoglobin: 7.1 g/dL — ABNORMAL LOW (ref 13.0–17.0)
MCH: 31.4 pg (ref 26.0–34.0)
MCHC: 29.3 g/dL — ABNORMAL LOW (ref 30.0–36.0)
MCV: 107.1 fL — ABNORMAL HIGH (ref 80.0–100.0)
Platelets: 320 10*3/uL (ref 150–400)
RBC: 2.26 MIL/uL — ABNORMAL LOW (ref 4.22–5.81)
RDW: 16.2 % — ABNORMAL HIGH (ref 11.5–15.5)
WBC: 21 10*3/uL — ABNORMAL HIGH (ref 4.0–10.5)
nRBC: 0.2 % (ref 0.0–0.2)

## 2020-12-04 LAB — COMPREHENSIVE METABOLIC PANEL
ALT: 27 U/L (ref 0–44)
AST: 26 U/L (ref 15–41)
Albumin: 1.3 g/dL — ABNORMAL LOW (ref 3.5–5.0)
Alkaline Phosphatase: 131 U/L — ABNORMAL HIGH (ref 38–126)
Anion gap: 8 (ref 5–15)
BUN: 58 mg/dL — ABNORMAL HIGH (ref 8–23)
CO2: 19 mmol/L — ABNORMAL LOW (ref 22–32)
Calcium: 7.9 mg/dL — ABNORMAL LOW (ref 8.9–10.3)
Chloride: 124 mmol/L — ABNORMAL HIGH (ref 98–111)
Creatinine, Ser: 1.31 mg/dL — ABNORMAL HIGH (ref 0.61–1.24)
GFR, Estimated: 57 mL/min — ABNORMAL LOW (ref >60)
Glucose, Bld: 236 mg/dL — ABNORMAL HIGH (ref 70–99)
Potassium: 4.6 mmol/L (ref 3.5–5.1)
Sodium: 151 mmol/L — ABNORMAL HIGH (ref 135–145)
Total Bilirubin: 1.1 mg/dL (ref 0.3–1.2)
Total Protein: 5.3 g/dL — ABNORMAL LOW (ref 6.5–8.1)

## 2020-12-04 LAB — GLUCOSE, CAPILLARY
Glucose-Capillary: 178 mg/dL — ABNORMAL HIGH (ref 70–99)
Glucose-Capillary: 191 mg/dL — ABNORMAL HIGH (ref 70–99)
Glucose-Capillary: 170 mg/dL — ABNORMAL HIGH (ref 70–99)
Glucose-Capillary: 113 mg/dL — ABNORMAL HIGH (ref 70–99)
Glucose-Capillary: 121 mg/dL — ABNORMAL HIGH (ref 70–99)
Glucose-Capillary: 139 mg/dL — ABNORMAL HIGH (ref 70–99)

## 2020-12-04 LAB — PHOSPHORUS: Phosphorus: 3.4 mg/dL (ref 2.5–4.6)

## 2020-12-04 LAB — MAGNESIUM: Magnesium: 2.4 mg/dL (ref 1.7–2.4)

## 2020-12-04 MED ORDER — TRAVASOL 10 % IV SOLN
INTRAVENOUS | Status: AC
  Filled 2020-12-04: qty 720

## 2020-12-04 MED ORDER — PIPERACILLIN-TAZOBACTAM 3.375 G IVPB 30 MIN
3.3750 g | Freq: Once | INTRAVENOUS | Status: AC
  Administered 2020-12-04: 12:00:00 3.375 g via INTRAVENOUS
  Filled 2020-12-04 (×2): qty 50

## 2020-12-04 MED ORDER — DEXTROSE-NACL 5-0.45 % IV SOLN: INTRAVENOUS | Status: DC

## 2020-12-04 MED ORDER — IOHEXOL 9 MG/ML PO SOLN
1000.0000 mL | Freq: Once | ORAL | Status: AC
  Administered 2020-12-04: 1000 mL

## 2020-12-04 MED ORDER — DIPHENHYDRAMINE HCL 50 MG/ML IJ SOLN
50.0000 mg | Freq: Once | INTRAMUSCULAR | Status: AC
  Administered 2020-12-04: 16:00:00 50 mg via INTRAVENOUS
  Filled 2020-12-04: qty 1

## 2020-12-04 MED ORDER — NUTRISOURCE FIBER PO PACK
1.0000 | PACK | Freq: Three times a day (TID) | ORAL | Status: DC
  Administered 2020-12-04 – 2020-12-21 (×53): 1
  Filled 2020-12-04 (×56): qty 1

## 2020-12-04 MED ORDER — PIPERACILLIN-TAZOBACTAM 3.375 G IVPB
3.3750 g | Freq: Three times a day (TID) | INTRAVENOUS | Status: DC
  Administered 2020-12-04 – 2020-12-12 (×24): 3.375 g via INTRAVENOUS
  Filled 2020-12-04 (×24): qty 50

## 2020-12-04 MED ORDER — LACTATED RINGERS IV BOLUS
1000.0000 mL | Freq: Once | INTRAVENOUS | Status: AC
  Administered 2020-12-04: 1000 mL via INTRAVENOUS

## 2020-12-04 MED ORDER — OPIUM 10 MG/ML (1%) PO TINC
4.0000 [drp] | Freq: Four times a day (QID) | ORAL | Status: DC
Start: 2020-12-04 — End: 2021-01-15
  Administered 2020-12-04 – 2021-01-15 (×160): 2 mg
  Filled 2020-12-04 (×25): qty 1
  Filled 2020-12-04: qty 0.2
  Filled 2020-12-04 (×134): qty 1

## 2020-12-04 MED ORDER — IOHEXOL 9 MG/ML PO SOLN: 1000.0000 mL | Freq: Once | ORAL | Status: DC

## 2020-12-04 MED ORDER — SODIUM CHLORIDE 0.45 % IV SOLN: INTRAVENOUS | Status: DC

## 2020-12-04 MED ORDER — OSMOLITE 1.5 CAL PO LIQD
1000.0000 mL | ORAL | Status: DC
  Administered 2020-12-09: 05:00:00 1000 mL

## 2020-12-04 MED ORDER — FREE WATER
200.0000 mL | Status: DC
  Administered 2020-12-04 – 2020-12-20 (×94): 200 mL

## 2020-12-04 MED ORDER — METOPROLOL TARTRATE 25 MG PO TABS
12.5000 mg | ORAL_TABLET | Freq: Two times a day (BID) | ORAL | Status: DC
  Administered 2020-12-05 – 2020-12-13 (×15): 12.5 mg
  Filled 2020-12-04 (×16): qty 1

## 2020-12-04 MED ORDER — INSULIN ASPART 100 UNIT/ML ~~LOC~~ SOLN
0.0000 [IU] | SUBCUTANEOUS | Status: DC
  Administered 2020-12-04 (×2): 2 [IU] via SUBCUTANEOUS
  Administered 2020-12-04: 13:00:00 3 [IU] via SUBCUTANEOUS
  Administered 2020-12-05: 16:00:00 2 [IU] via SUBCUTANEOUS
  Administered 2020-12-05: 05:00:00 3 [IU] via SUBCUTANEOUS
  Administered 2020-12-06: 2 [IU] via SUBCUTANEOUS
  Administered 2020-12-06: 08:00:00 3 [IU] via SUBCUTANEOUS

## 2020-12-04 MED ORDER — OXYCODONE HCL 5 MG PO TABS
10.0000 mg | ORAL_TABLET | Freq: Once | ORAL | Status: AC
Start: 2020-12-04 — End: 2020-12-04
  Administered 2020-12-04: 11:00:00 10 mg
  Filled 2020-12-04: qty 2

## 2020-12-04 MED ORDER — FENTANYL 2500MCG IN NS 250ML (10MCG/ML) PREMIX INFUSION
0.0000 ug/h | INTRAVENOUS | Status: DC
  Administered 2020-12-05: 13:00:00 100 ug/h via INTRAVENOUS
  Administered 2020-12-07: 02:00:00 75 ug/h via INTRAVENOUS
  Filled 2020-12-04 (×2): qty 250

## 2020-12-04 MED ORDER — ALBUMIN HUMAN 25 % IV SOLN
25.0000 g | Freq: Four times a day (QID) | INTRAVENOUS | Status: AC
  Administered 2020-12-04 – 2020-12-05 (×4): 25 g via INTRAVENOUS
  Filled 2020-12-04 (×4): qty 100

## 2020-12-04 NOTE — Progress Notes (Signed)
NAME:  Ronald Wolf, MRN:  [id number], DOB:  1946-01-23, LOS: 1 ADMISSION DATE:  11/14/2020, CONSULTATION DATE:  11/26/2020 REFERRING MD: Cyndia Skeeters CHIEF COMPLAINT:  hypotension   Brief History   Ronald Wolf is a 74 yo M w/ PMH of CVA, Seizures, Dementia, Bladder ca s/p urostomy, tobacco use, right glottic carcinoma s/p excision presenting with sbo. Found to have ischemic bowel requiring ex-lap. PCCM consulted due to post-op hypotension / respiratory distress  History of present illness   Ronald Wolf is a 74 yo M w/ above PMH who initially presented to [facility] hospital on 11/11/20 with abdominal pain and nausea of 4-5 day duration. He was found to have SBO but left against medical advice as his family did not feel comfortable with him receiving his medical care in [place]. He came to [facility] ED the next day with similar complaint and was found to have SBO. He was treated with supportive care with bowel rest and ng suction. However his symptoms did not improve. Surgery team was consulted and he was brough to OR for exploratory laparoscopy. He was found to have multiple sections of ischemic bowel which were resected. He was unable to be extubated post-operatively and PCCM was consulted.  Past Medical History  CVA, Dementia, Bladder CA s/p nephrostomy, tobacco use, right glottic carcinoma s/p excision, seizures.  Significant Hospital Events   12/05/2020 Admission 12/04/2020 OR 11/29/2020 Admit to ICU 12/10 Brief SVT with associated hypertension. Self limited Given fentanyl, metop and hydral for HTN 12/11 Good urine output with Lasix , failed extubation, reintubated within 2 hours due to inability to clear secretions 12/12 return OR for enteric leak-- found to have bowel necrosis. Left in discontinuity  12/13 self extubated, reintubated. On pressors. Family meeting with PCCM and CCS to discuss possible next steps.  12/15-was back in OR, abdominal closure, RUQ mucus fistula and L ileostomy  12/19: No  significant overnight events, high ileostomy output 12/21: scheduled for family meeting. Worse hypernatremia, continued high ileostomy output.  Attempted PSV/CPAP wean, pulled volumes of high 100s -low/mid 200s with tachypnea. Placed back of full support.  12/22 ongoing high output GI/ ileostomy, TPN started; weaned 12/5 all day; off fentanyl gtt, remains on precedex   Consults:  General Surgery PCCM  Procedures:  11/23/2020 Ex-lap, G-tube placement ETT 12/7 >> 12/11, >> 12/15- back to OR for abdominal closure, exploratory laparotomy   Significant Diagnostic Tests:   CT abd/pelvis 12/2 > High-grade distal small bowel obstruction, possibly due to adhesion. Mild ascites and diffuse mesenteric edema. No evidence of focal inflammatory process or abscess. Small bilateral pleural effusions and mild right lower lobe Atelectasis.  CXR 12/20> PICC stable, ETT stable. Bibasilar infiltrates/edema without interval change   Micro Data:  12/09/2020 COVID/Flu negative 12/7 BA: Few GNR rare GPC -- predominantly PMN  12/7 BCx> Neg 12/11 respiratory -no organism in tracheal aspirate  Antimicrobials:  Fluc 12/8>> 12/10 Zosyn 12/8> 12/12 cefotetain 12/15  Interim history/subjective:  Afebrile More awake today, f/c, issues of pain overnight, remains on precedex BP down to 86/57 overnight ileostomy output 3L, increasing -1L/24hrs;  net -10L balance  Objective   Blood pressure (!) 86/57, pulse 77, temperature 98.3 F (36.8 C), temperature source Oral, resp. rate (!) 34, height 5\' 11"  (1.803 m), weight 55.9 kg, SpO2 100 %.    Vent Mode: PRVC FiO2 (%):  [30 %] 30 % Set Rate:  [15 bmp] 15 bmp Vt Set:  [450 mL] 450 mL PEEP:  [5 cmH20] 5 cmH20 Pressure  Support:  [12 [place] Pressure:  [16 cmH20-30 cmH20] 16 cmH20   Intake/Output Summary (Last 24 hours) at 12/04/2020 0743 Last data filed at 12/04/2020 [id number] Gross per 24 hour  Intake 2838.24 ml  Output 3925 ml  Net -1086.76 ml    Filed Weights   12/01/20 0455 12/02/20 0500 12/03/20 0500  Weight: 60.3 kg 55.6 kg 55.9 kg    Examination: General:  Chronically ill/ cachetic frail elderly male sitting upright in bed in mild distress HEENT: MM pink/dry, pupils 3/reactive, ETT, temporal muscle wasting, NGT since removed Neuro: Awake, able to follow simple commands, wiggles fingers/ toes, spont MAE- generalized weakness CV: rr, no murmur PULM:  tachypneic but non labored, CTA, no secretions GI: +bs, right mucous fistual dusky with ongoing minimal dark bloody drainage, midline dressing somewhat saturated- had to change dressing twice yesterday, left ileostomy with ongoing yellowish output, Gtube with some greenish drainage around insertion site Extremities: warm/dry, no edema  Skin: no rashes, poor turgor  Labs:  Na 153->151 CL improved 127-> 124 sCr 1.49-> 1.31, UOP 865 ml/ 24hr WBC 23.5-> 21 (left shift noted on differential from 12/22) Hgb 7.6-> 7.1   CXR reviewed- ETT 3.5 above carina, left PICC good placement, persistent RLL opacity  Resolved Hospital Problem list   AKI   Assessment & Plan:   Hypovolemia 2/2 high output ileostomy +/- SIRS Leukocytosis, ongoing P 1L LR now, may need to repeat Goal MAP > 65 Ongoing leukocytosis, no fever, but left shift on differential Start zosyn (will cover intraabdominal process and possible RLL pna, suspect more atelectasis)  Trend WBC/ fever curve   Acute encephalopathy, multifactorial  More awake today, non focal  Continue to minimize sedation as needed RASS goal 0/-1, continue precedex and prn fentanyl.  Pain may be an issue today, therefore given poor EN absorption, can trial a dose of enteral oxy but likely will need to restart fentanyl, low dose, if ongoing frequent boluses are needed.   Continue neuro checks  Delirium precautions  Acute hypoxic respiratory failure requiring mechanical ventilation, vdrf  Has been intubated x3 during this  hospitalization CXR with bibasilar infiltrates vs edema -- last tracheal aspirate 12/11 with no orgs  Pleural effusions, improved  - Secondary to malnutrition, low albumin -Has failed extubation multiple times, mentation ongoing barrier for weaning criteria, and now is significantly more debilitated than for prior attempts - do not anticipate will be able to be extubated  P Weaned all day 12/22 on PSV 12/5, today SBT trials limited to underlying tachypnea due to pain.  Will try PSV trials when pain better controlled.  No extubation.  Planning for trach next week Continue full MV support, PRVC Continue VAP/ PPI Intermittent CXR  SBO Ischemic bowel perforation  -s/p 2 ex laps, resection of ischemic bowel, mucus fistula and ileostomy, G tube placement  -12/21 some green/tan drainage around G tube  P CCS following  TPN per pharmacy  Ongoing midline dressings/ wound care, continue to monitor mucous fistula (dusky), and continued small amount of greenish drainage around Gtube  Hypernatremia, slowing improving AKI  -suspect hypovolemic 2/2 high GI output  P Continue D5W and FWF Trend renal indices  Fluid bolus as above Strict I/Os  HTN SVT, improved AFib RVR P Concern for developing SIRS/ hypovolemia.  Ongoing leukocytosis concerning for SIRS +/- hypovolemia Hold Metoprolol today Remains in NSR, poor candidate for anticoagulation   Severe protein calorie malnutrition Continue EN per Gtube and TPN per pharmacy   Hx  seizure No clinical evidence of seizures, ongoing seizure precautions Continue Keppra   History of bladder cancer Urostomy care ongoing   Anemia Transfuse for Hgb < 7; trend stable  CBC in am    Goals of Care -poor baseline due to multiple medical problems, post emergent surgery with slow recovery, failed extubations (12/11, 12/13) due to general deconditioning and inability to clear secretions.  -12/13 Family discussion with CCS and PCCM. Family has decided  to pursue all offered aggressive interventions (return OR for abd, trach, full code) -Palliative care consult appreciated for ongoing [place] discussions -Family meeting held again 12/02/20 -- decided they want to proceed with tracheostomy (for more comfort) + ongoing aggressive medical management for now with ongoing [place] discussions -Guarded prognosis given prior debility and ongoing clinical decline despite aggressive care; Appreciate PMT assistance.  Family to consider changing to DNR over Christmas gatherings.  Still planning for trach next week  Best practice (evaluated daily)  Diet: EN/ adding TPN Pain/Anxiety/Delirium protocol (if indicated): Fent gtt-> prn, precedex VAP protocol (if indicated): Yes  DVT prophylaxis: SQH  GI prophylaxis: PPI Glucose control: SSI  Mobility:BR  Family updated - please see lengthy documentation 12/21 Family meeting- last 12/21; ongoing  Code Status: Full Disposition: ICU  Labs   CBC: Recent Labs  Lab 11/30/20 0419 12/01/20 0506 12/02/20 0510 12/03/20 0502 12/04/20 0510  WBC 27.7* 24.1* 18.0* 23.5* 21.0*  NEUTROABS 25.2* 19.2*  --  18.0*  --   HGB 7.4* 7.7* 8.9* 7.6* 7.1*  HCT 25.7* 25.7* 28.6* 24.8* 24.2*  MCV 109.8* 105.3* 104.0* 106.4* 107.1*  PLT 292 346 339 368 536    Basic Metabolic Panel: Recent Labs  Lab 11/27/20 0810 11/28/20 0646 11/29/20 0452 11/30/20 0419 12/01/20 0506 12/02/20 0510 12/03/20 0502 12/04/20 0510  NA 143 145 146* 150* 152* 158* 153* 151*  K 5.0 4.7 4.1 3.9 3.7 3.8 4.2 4.6  CL 110 110 113* 118* 122* 128* 127* 124*  CO2 25 29 26 22  20* 20* 19* 19*  GLUCOSE 181* 189* 192* 127* 116* 99 166* 236*  BUN 55* 63* 63* 67* 63* 61* 58* 58*  CREATININE 1.11 1.13 1.08 1.23 1.13 1.21 1.49* 1.31*  CALCIUM 8.2* 8.2* 8.1* 8.3* 8.2* 8.3* 8.3* 7.9*  MG 2.2 2.2 2.2  --   --   --  2.5* 2.4  PHOS 3.1  --  3.0  --   --   --  3.5 3.4   GFR: Estimated Creatinine Clearance: 39.1 mL/min (A) (by C-G formula based on SCr of 1.31  mg/dL (H)). Recent Labs  Lab 12/01/20 0506 12/02/20 0510 12/03/20 0502 12/04/20 0510  WBC 24.1* 18.0* 23.5* 21.0*    Liver Function Tests: Recent Labs  Lab 11/27/20 0810 12/03/20 0502 12/04/20 0510  AST 26 28 26   ALT 21 24 27   ALKPHOS 83 152* 131*  BILITOT 1.6* 0.9 1.1  PROT 4.7* 5.8* 5.3*  ALBUMIN 1.4* 1.4* 1.3*   No results for input(s): LIPASE, AMYLASE in the last 168 hours. No results for input(s): AMMONIA in the last 168 hours.  ABG    Component Value Date/Time   PHART 7.361 11-28-2020 2113   PCO2ART 47.6 2020-11-28 2113   PO2ART 41 (L) November 28, 2020 2113   HCO3 26.9 11-28-20 2113   TCO2 28 11-28-20 2113   ACIDBASEDEF 2.0 11/14/2020 1658   O2SAT 72.0 11-28-2020 2113     Coagulation Profile: No results for input(s): INR, PROTIME in the last 168 hours.  Cardiac Enzymes: No results for  input(s): CKTOTAL, CKMB, CKMBINDEX, TROPONINI in the last 168 hours.  HbA1C: Hgb A1c MFr Bld  Date/Time Value Ref Range Status  11/19/2020 10:37 AM 5.0 4.8 - 5.6 % Final    Comment:    (NOTE) Pre diabetes:          5.7%-6.4%  Diabetes:              >6.4%  Glycemic control for   <7.0% adults with diabetes   12/17/2019 08:19 AM 4.5 (L) 4.8 - 5.6 % Final    Comment:    (NOTE) Pre diabetes:          5.7%-6.4% Diabetes:              >6.4% Glycemic control for   <7.0% adults with diabetes    CRITICAL CARE Performed by: Kennieth Rad   Total critical care time: 33 minutes  Critical care time was exclusive of separately billable procedures and treating other patients. Critical care was necessary to treat or prevent imminent or life-threatening deterioration.  Critical care was time spent personally by me on the following activities: development of treatment plan with patient and/or surrogate as well as nursing, discussions with consultants, evaluation of patient's response to treatment, examination of patient, obtaining history from patient or surrogate, ordering  and performing treatments and interventions, ordering and review of laboratory studies, ordering and review of radiographic studies, pulse oximetry and re-evaluation of patient's condition.  Kennieth Rad, ACNP [facility] 12/04/2020, 7:43 AM

## 2020-12-04 NOTE — Progress Notes (Signed)
PHARMACY - TOTAL PARENTERAL NUTRITION CONSULT NOTE  Indication: Small bowel obstruction, prolonged ileus  Patient Measurements: Height: 5\' 11"  (180.3 cm) Weight: 61.2 kg (134 lb 14.7 oz) IBW/kg (Calculated) : 75.3 TPN AdjBW (KG): 60.8 Body mass index is 18.82 kg/m. Usual Weight: 137 lbs  Assessment:  74 YOM presented 12/09/2020 with nausea, vomiting, abdominal pain after leaving AMA at [facility] with known SBO. Patient has been on bowel rest with NG tube decompression. Patient continued to have persistent SBO on imaging with no evidence of improvement, now s/p ex-lap with LoA, SBR and placement of Gtube on 11/27/2020. Pharmacy consulted for TPN management - patient received TPN 11/19/20 through 11/29/20.  Reconsulted to resume TPN on 12/21 despite tube feeds at goal rate due to high ostomy output and concern for tube feeds not absorbing. Patient on loperamide and fiber without improvement in output.   Glucose / Insulin: no hx DM - CBGs mostly <180 (range 153-236) on TPN (controlled off TPN) - utilized 7u SSI in last 24 hrs Electrolytes: Na down to 151 (FW 300 q8h + D5 at 50 ml/hr), Cl down to 124, CO2 stable 19, K up to 4.6, others WNL Renal: SCr back down to 1.31, BUN 58 stable LFTs / TGs: LFTs / Tbili / TG WNL Prealbumin / albumin: Pre-albumin 14.6 (12/22), up from 5.5 (12/13) / albumin 1.3  Intake / Output; MIVF: UOP 0.6 ml/kg/hr, high ileostomy output 4010 mL, net -10.6L (significantly down); LBM 12/23. D5W at 50 ml/hr. GI Imaging: 12/11 DG Abd - stomach gas-filled, without distention, no obvious free air Surgeries / Procedures: 12/12: re-exploration and washout with new copious drainage from incision concerning for enteric leak vs. enterotomy >> now s/p, bowel necrosis resected 12/15: ex-lap with ileostomy with fistula creation, abdominal closure  Central access: PICC placed 11/19/2020 TPN start date: 11/19/20>>11/29/20; restart 12/03/20 >>  Nutritional Goals (updated per RD rec  12/21): 2300-2700 kCal, 120-135g protein, >2L fluid per day  Goal TPN rate of 100 cc/hr will provide 120g AA, 432g CHO, and 36g lipids (15% of total kCal due to [employer]) for a total of 2308 kcal, meeting 100% of needs  Current Nutrition:  Osmolite 1.5 at 65 ml/h (1560 ml per day) Prosource TF 45 ml TID  D5W at 50 ml/hr  Plan:  - Increase TPN to 60 cc/hr at 1800 - TPN will provide 72g protein, 259g CHO, 22g SMOF lipids (~15% of total kcal due to [employer]), and 1385 kcal meeting ~60% of estimated needs - Electrolytes in TPN: reduce Na to 15 mEq/L and K to 25 mEq/L; continue 3 mEq/L of Ca, 3 mEq/L of Mg, 59mmol/L of Phos, and max acetate (other lytes will increase with rate increase) - Add standard MVI and trace elements + folic acid (d/c per tube order) to TPN - Change to moderate SSI q4h and adjust as needed - Continue D5W at 50 cc/hr and FW 300 PT q8h for now per MD given high ileostomy output and fluid requirements. Dr. Barry Dienes will watch for now and consider adjustments 12/24 - Monitor TPN labs - Will follow along with plans - right now to continue full tube feeds and await reduction in output and bowel reabsorption while titrating up TPN to goal rate per discussion with Surgery MD and RD 12/23   [name], PharmD, BCPS Please check AMION for all [facility] contact numbers Clinical Pharmacist 12/04/2020 8:16 AM

## 2020-12-04 NOTE — Progress Notes (Signed)
eLink Physician-Brief Progress Note Patient Name: Ronald Wolf DOB: 06-28-1946 MRN: [id number]   Date of Service  12/04/2020  HPI/Events of Note  Radiology called with report of a new intra-abdominal abscess, I spoke with Dr. Georgette Dover of general surgery  who indicated that he was aware of the abscess and planned to have it drained in the a.m.  Patient with sub-optimal sedation.  eICU Interventions  Communicated with Dr. Georgette Dover to report CT finding of intra-abdominal abscess. Fentanyl infusion ordered to optimize sedation and analgesia.        Frederik Pear 12/04/2020, 8:37 PM

## 2020-12-04 NOTE — Progress Notes (Addendum)
Nutrition Follow-up  DOCUMENTATION CODES:   Severe malnutrition in context of chronic illness  INTERVENTION:   TPN initiated 12/22 and increasing to meet nutrition goals - goal 75% adjusting based on EN tolerance/output. Pt severely malnourished losing weight off TPN  Continue enteral feedings to promote gut adaptation. TPN helping to meet needs during this time.   Tube feeding via G-tube: Osmolite 1.5 at 40 ml/hr ProSource TF 45 ml TID  Goal: Osmolite 1.5 @ 65 ml/h (1560 ml per day)  Provides 2460 kcal, 130 gm protein, 1185 ml free water daily 300 ml free water every 8 hours Total free water: 2085 ml   1 packet Nutrisource fiber TID - provides 9 grams soluble fiber per day  NUTRITION DIAGNOSIS:   Severe Malnutrition related to chronic illness as evidenced by severe fat depletion,severe muscle depletion. Ongoing.   GOAL:   Patient will meet greater than or equal to 90% of their needs Meeting with nutrition support   MONITOR:   I & O's  REASON FOR ASSESSMENT:   Consult Enteral/tube feeding initiation and management  ASSESSMENT:   74 year old male with history of cognitive impairment, CVA, HTN, bladder cancer now with urostomy, right-sided glottic carcinoma s/p excision brought to ED with nausea, vomiting and abdominal pain.  Work-up in ED revealed small bowel obstruction.  He also had AKI and lactic acidosis.  Started on bowel rest with NG tube decompression and IV fluid.  General surgery following.  Subsequent x-rays with persistent SBO.  Pt discussed during ICU rounds and with RN.  Palliative care meeting 12/21 plan for full scope of care and trach next week.  Per MD more awake and weaning. Attempting to transition pain medication administration to per tube.  Opium added today  12/7 s/p ex lap, extensive LOA, SBR, g-tube placement; s/p bronch  12/10 TPN advanced to goal rate - provides: 2120 kcal and 121 grams protein  12/12 back to OR for re-exploration and  washout with new copious drainage from incision; pt s/p reopening of recent laparotomy, SBR without reanastomosis, abd washout, placement of VAC 12/15 s/p ex lap, ileostomy, mucous fistula, and closure 12/18 TPN d/c'ed; TF at goal rate 12/22 TPN initiated to support patient due to short bowel  12/23 opium added   Patient is currently intubated on ventilator support Admission weight 60.8 kg, pt now 55.9 kg Pt down 5 kg since admission.   Medications reviewed and include: SSI, imodium 4 mg every 8 hours, opium 4 drops every 6 hours  Precedex  D5@ 50 ml/hr   Labs reviewed: Na 151, BUN: 58, Cr: 1.31 CBG's: [phone]  I&O's:  Pt is negative 10.5 L since admission  16 F NG tube: removed 12/22 18 F G-tube -greenish drainage around site --> infusing TF  Ileostomy: 3375 ml --> 4410 ml --->3050 ml Mucous fistula: 10 ml - dusky per documentation  UOP: 865 ml   12/22 TPN @ 40 ml/hr - provides: 923 kcal and 48 grams protein    Diet Order:   Diet Order            Diet NPO time specified  Diet effective now                 EDUCATION NEEDS:   No education needs have been identified at this time  Skin:  Skin Assessment:  (abd incision - with dressing changes)  Last BM:  3050 ml via ileostomy  Height:   Ht Readings from Last 1 Encounters:  11/22/20 5\' 11"  (  1.803 m)    Weight:   Wt Readings from Last 1 Encounters:  12/03/20 55.9 kg    Ideal Body Weight:  78.2 kg  BMI:  Body mass index is 17.19 kg/m.  Estimated Nutritional Needs:   Kcal:  2300-2700  Protein:  120-135 grams  Fluid:  > 2 L  Wilho Sharpley P., RD, LDN, CNSC See AMiON for contact information

## 2020-12-04 NOTE — Progress Notes (Signed)
8 Days Post-Op   Subjective/Chief Complaint: Stool output remains very high.  3.6 L from ileostomy yesterday.  WBCs also remain high.     Objective: Vital signs in last 24 hours: Temp:  [96.8 F (36 C)-98.7 F (37.1 C)] 98.3 F (36.8 C) (12/23 0800) Pulse Rate:  [77-105] 78 (12/23 0845) Resp:  [22-37] 34 (12/23 0845) BP: (86-155)/(57-87) 116/79 (12/23 0845) SpO2:  [100 %] 100 % (12/23 0845) FiO2 (%):  [30 %] 30 % (12/23 0845) Last BM Date: 12/04/20  Intake/Output from previous day: 12/22 0701 - 12/23 0700 In: 2838.2 [I.V.:1133.2; NG/GT:1705.1] Out: 2202 [Urine:865; Stool:3060] Intake/Output this shift: No intake/output data recorded.  PE: General appearance: no distress, sedated, intubated.   Resp: clear to auscultation bilaterally Cardio: regular rate and rhythm GI: soft, GT in place, ileostomy with output looks like bilious TF, mucous fistula dusky.  Quite a bit of drainage from midline wound, but not purulent.  Some fibrinous exudate on fascia, but fascia appears intact.  Urostomy pink.   Neurologic:sedated.    Lab Results:  Recent Labs    12/03/20 0502 12/04/20 0510  WBC 23.5* 21.0*  HGB 7.6* 7.1*  HCT 24.8* 24.2*  PLT 368 320   BMET Recent Labs    12/03/20 0502 12/04/20 0510  NA 153* 151*  K 4.2 4.6  CL 127* 124*  CO2 19* 19*  GLUCOSE 166* 236*  BUN 58* 58*  CREATININE 1.49* 1.31*  CALCIUM 8.3* 7.9*    Studies/Results: DG Chest Port 1 View  Result Date: 12/04/2020 CLINICAL DATA:  Respiratory failure EXAM: PORTABLE CHEST 1 VIEW COMPARISON:  December 02, 2020. FINDINGS: Endotracheal tube is approximately 3.5 cm above the carina, not substantially changed. Left-sided PICC tip projects at the superior cavoatrial junction, unchanged. Persistent hazy opacity involving the medial right lung base. No visible pleural effusions or pneumothorax. Stable cardiomediastinal silhouette, which is within normal limits. Aortic atherosclerosis. IMPRESSION: 1. No  substantial change in lines/tubes, as detailed above. 2. Persistent hazy opacity in the medial right lung base, which may represent atelectasis and/or pneumonia. Electronically Signed   By: Feliberto Harts MD   On: 12/04/2020 07:47    Anti-infectives: Anti-infectives (From admission, onward)   Start     Dose/Rate Route Frequency Ordered Stop   12/04/20 1500  piperacillin-tazobactam (ZOSYN) IVPB 3.375 g        3.375 g 12.5 mL/hr over 240 Minutes Intravenous Every 8 hours 12/04/20 0818     12/04/20 0915  piperacillin-tazobactam (ZOSYN) IVPB 3.375 g        3.375 g 100 mL/hr over 30 Minutes Intravenous  Once 12/04/20 0818     12/08/2020 0945  cefoTEtan (CEFOTAN) 2 g in sodium chloride 0.9 % 100 mL IVPB  Status:  Discontinued        2 g 200 mL/hr over 30 Minutes Intravenous To Surgery 11/20/2020 0936 12/10/2020 1057   12/03/2020 0900  cefoTEtan (CEFOTAN) 2 g in sodium chloride 0.9 % 100 mL IVPB  Status:  Discontinued        2 g 200 mL/hr over 30 Minutes Intravenous  Once 12/09/2020 0833 11/20/2020 0936   11/19/20 1130  piperacillin-tazobactam (ZOSYN) IVPB 3.375 g        3.375 g 12.5 mL/hr over 240 Minutes Intravenous Every 8 hours 11/19/20 1017 12/11/2020 2359   11/19/20 1115  fluconazole (DIFLUCAN) IVPB 200 mg        200 mg 100 mL/hr over 60 Minutes Intravenous Every 24 hours 11/19/20 1017 11/21/20 1104  11/19/20 0915  Ampicillin-Sulbactam (UNASYN) 3 g in sodium chloride 0.9 % 100 mL IVPB  Status:  Discontinued        3 g 200 mL/hr over 30 Minutes Intravenous Every 12 hours 11/19/20 0826 11/19/20 0949      Assessment/Plan: SBO -s/p ex lap with SBR, placement of a gastrostomy tube12/7 by Dr. Georgette Dover. Takeback 12/12 by Dr. Zenia Resides for necrotic SB with resection of additional 60-70cm of SB inclusive of prior anastomosis in discontinuity with open abdomen. - S/P ex lap, ileostomy, mucous fistula, closure 12/15 by Dr. Grandville Silos Malnourishment/FEN - cont' TNA/TFs- prealbumin going up, but stool output is  double tube feeds volume despite adding imodium and fiber.  Add DTO.  Pharmacy and dietary evaluating volume status and tube feed formula.  Working on hypernatremia as well.    VDRF- self-extubated 12/13 AM, re-intubated ~3h after. Second time failing extubation, vent settings back down to normal, per CCM.  Trach tentatively planned last week.    Shock/ID-resolved;septic, completed 5d zosyn, necrotic bowel was most likely source. Pressorsare off.  WBCs still very high.  Will rescan today.   CV/AF RVR- in NSRcurrently  VTE-SQH Dispo - ICU  Family wanting con't care. Trach tentatively planned.    CC time:33 min   LOS: 22 days   Milus Height, MD FACS Surgical Oncology, General Surgery, Trauma and [place] [facility], [place] for weekday/non holidays Check [email or web address] for coverage night/weekend/holidays  Do not use SecureChat as it is not reliable for timely patient care.

## 2020-12-04 NOTE — Progress Notes (Signed)
Pharmacy Antibiotic Note  Ronald Wolf is a 74 y.o. male admitted on 11-29-20 with pneumonia/intra-abdominal infection.  Pharmacy has been consulted for Zosyn dosing. SCr back down to 1.31.  Plan: Zosyn 3.375g IV (43min infusion) x1; then 3.375g IV q8h (4h infusion) Monitor clinical progress, c/s, renal function F/u de-escalation plan/LOT   Height: 5\' 11"  (180.3 cm) Weight: 55.9 kg (123 lb 3.8 oz) IBW/kg (Calculated) : 75.3  Temp (24hrs), Avg:97.8 F (36.6 C), Min:96.8 F (36 C), Max:98.7 F (37.1 C)  Recent Labs  Lab 11/30/20 0419 12/01/20 0506 12/02/20 0510 12/03/20 0502 12/04/20 0510  WBC 27.7* 24.1* 18.0* 23.5* 21.0*  CREATININE 1.23 1.13 1.21 1.49* 1.31*    Estimated Creatinine Clearance: 39.1 mL/min (A) (by C-G formula based on SCr of 1.31 mg/dL (H)).    Allergies  Allergen Reactions  . Iohexol Hives     Desc: pt broke out in hives years ago from iv contrast. he was given 50mg  benadryl po, 1 hr prior to ct and did fine today w/o complications.  JB      [name], PharmD, BCPS Please check AMION for all [facility] contact numbers Clinical Pharmacist 12/04/2020 8:33 AM

## 2020-12-04 NOTE — Progress Notes (Signed)
RT transported patient from 4N30 to CT and back with RN. No complications. RT will continue to monitor.

## 2020-12-04 NOTE — Progress Notes (Signed)
[facility] Progress Note Patient Name: Ronald Wolf DOB: 05/22/1946 MRN: [id number]   Date of Service  12/04/2020  HPI/Events of Note  Patient needs bilateral wrist restraints for safety and to prevent self-extubation.  eICU Interventions  Bilateral soft wrist restraints ordered.        Ronald Wolf 12/04/2020, 10:14 PM

## 2020-12-05 ENCOUNTER — Inpatient Hospital Stay (HOSPITAL_COMMUNITY): Payer: Medicare PPO

## 2020-12-05 ENCOUNTER — Encounter (HOSPITAL_COMMUNITY): Payer: Self-pay | Admitting: Internal Medicine

## 2020-12-05 DIAGNOSIS — K651 Peritoneal abscess: Secondary | ICD-10-CM

## 2020-12-05 DIAGNOSIS — E43 Unspecified severe protein-calorie malnutrition: Secondary | ICD-10-CM | POA: Diagnosis not present

## 2020-12-05 DIAGNOSIS — J9601 Acute respiratory failure with hypoxia: Secondary | ICD-10-CM | POA: Diagnosis not present

## 2020-12-05 LAB — CBC
HCT: 23.5 % — ABNORMAL LOW (ref 39.0–52.0)
Hemoglobin: 6.5 g/dL — CL (ref 13.0–17.0)
MCH: 33.5 pg (ref 26.0–34.0)
MCHC: 27.7 g/dL — ABNORMAL LOW (ref 30.0–36.0)
MCV: 121.1 fL — ABNORMAL HIGH (ref 80.0–100.0)
Platelets: 314 10*3/uL (ref 150–400)
RBC: 1.94 MIL/uL — ABNORMAL LOW (ref 4.22–5.81)
RDW: 17.6 % — ABNORMAL HIGH (ref 11.5–15.5)
WBC: 17.5 10*3/uL — ABNORMAL HIGH (ref 4.0–10.5)
nRBC: 0.2 % (ref 0.0–0.2)

## 2020-12-05 LAB — COMPREHENSIVE METABOLIC PANEL
ALT: 20 U/L (ref 0–44)
AST: 20 U/L (ref 15–41)
Albumin: 2.2 g/dL — ABNORMAL LOW (ref 3.5–5.0)
Alkaline Phosphatase: 85 U/L (ref 38–126)
Anion gap: 8 (ref 5–15)
BUN: 49 mg/dL — ABNORMAL HIGH (ref 8–23)
CO2: 17 mmol/L — ABNORMAL LOW (ref 22–32)
Calcium: 7.7 mg/dL — ABNORMAL LOW (ref 8.9–10.3)
Chloride: 117 mmol/L — ABNORMAL HIGH (ref 98–111)
Creatinine, Ser: 1.22 mg/dL (ref 0.61–1.24)
GFR, Estimated: >60 mL/min (ref >60)
Glucose, Bld: 151 mg/dL — ABNORMAL HIGH (ref 70–99)
Potassium: 4.3 mmol/L (ref 3.5–5.1)
Sodium: 142 mmol/L (ref 135–145)
Total Bilirubin: 0.9 mg/dL (ref 0.3–1.2)
Total Protein: 5.5 g/dL — ABNORMAL LOW (ref 6.5–8.1)

## 2020-12-05 LAB — PHOSPHORUS: Phosphorus: 3.2 mg/dL (ref 2.5–4.6)

## 2020-12-05 LAB — MAGNESIUM: Magnesium: 1.9 mg/dL (ref 1.7–2.4)

## 2020-12-05 LAB — GLUCOSE, CAPILLARY
Glucose-Capillary: 170 mg/dL — ABNORMAL HIGH (ref 70–99)
Glucose-Capillary: 96 mg/dL (ref 70–99)
Glucose-Capillary: 107 mg/dL — ABNORMAL HIGH (ref 70–99)
Glucose-Capillary: 121 mg/dL — ABNORMAL HIGH (ref 70–99)
Glucose-Capillary: 110 mg/dL — ABNORMAL HIGH (ref 70–99)

## 2020-12-05 LAB — PREPARE RBC (CROSSMATCH)

## 2020-12-05 MED ORDER — SODIUM CHLORIDE 0.9 % IV SOLN
200.0000 mg | Freq: Once | INTRAVENOUS | Status: AC
  Administered 2020-12-05: 13:00:00 200 mg via INTRAVENOUS
  Filled 2020-12-05: qty 200

## 2020-12-05 MED ORDER — HEPARIN SODIUM (PORCINE) 5000 UNIT/ML IJ SOLN
5000.0000 [IU] | Freq: Three times a day (TID) | INTRAMUSCULAR | Status: DC
  Administered 2020-12-06 – 2020-12-20 (×41): 5000 [IU] via SUBCUTANEOUS
  Filled 2020-12-05 (×40): qty 1

## 2020-12-05 MED ORDER — SODIUM CHLORIDE 0.9% FLUSH
5.0000 mL | Freq: Three times a day (TID) | INTRAVENOUS | Status: DC
  Administered 2020-12-05 – 2021-01-13 (×107): 5 mL

## 2020-12-05 MED ORDER — TRAVASOL 10 % IV SOLN
INTRAVENOUS | Status: AC
  Filled 2020-12-05: qty 900

## 2020-12-05 MED ORDER — SODIUM CHLORIDE 0.9% IV SOLUTION: Freq: Once | INTRAVENOUS | Status: AC

## 2020-12-05 MED ORDER — SODIUM CHLORIDE 0.9 % IV SOLN
100.0000 mg | INTRAVENOUS | Status: DC
  Administered 2020-12-06 – 2020-12-13 (×8): 100 mg via INTRAVENOUS
  Filled 2020-12-05 (×8): qty 100

## 2020-12-05 NOTE — Procedures (Signed)
  Procedure: CT drain placement R pelvic abscess 48f EBL:   minimal Complications:  none immediate  See full dictation in [facility].  Dillard Cannon MD Main # [phone] Pager  [phone] Mobile [phone]

## 2020-12-05 NOTE — Progress Notes (Signed)
RT transported patient from 4N30 to CT and back with RN. No complications. RT will continue to monitor.  

## 2020-12-05 NOTE — Progress Notes (Signed)
General Surgery Follow Up Note  Subjective:    Overnight Issues:   Objective:  Vital signs for last 24 hours: Temp:  [97.4 F (36.3 C)-98.9 F (37.2 C)] 98 F (36.7 C) (12/24 0843) Pulse Rate:  [64-117] 93 (12/24 0843) Resp:  [16-38] 31 (12/24 0843) BP: (76-193)/(53-92) 149/68 (12/24 0843) SpO2:  [93 %-100 %] 100 % (12/24 0843) FiO2 (%):  [30 %-40 %] 40 % (12/24 0738) Weight:  [59.1 kg] 59.1 kg (12/24 0451)  Hemodynamic parameters for last 24 hours:    Intake/Output from previous day: 12/23 0701 - 12/24 0700 In: 6069.3 [I.V.:3298.4; NG/GT:1000; IV Piggyback:350.9] Out: 4710 [Urine:1360; Stool:3350]  Intake/Output this shift: No intake/output data recorded.  Vent settings for last 24 hours: Vent Mode: PRVC FiO2 (%):  [30 %-40 %] 40 % Set Rate:  [15 bmp] 15 bmp Vt Set:  [450 mL] 450 mL PEEP:  [5 cmH20] 5 cmH20 Plateau Pressure:  [18 cmH20-19 cmH20] 19 cmH20  Physical Exam:  Gen: comfortable, no distress Neuro: grossly non-focal, does not follow commands HEENT: intubated Neck: supple CV: RRR Pulm: unlabored breathing, mechanically ventilated Abd: soft, appropriately TTP, midline wound with some fibrinous exudate, ostomy dusky by productive, mucous fistula pink and healthy, urostomy productive, g-tube draining with some purulence around sit GU: clear, yellow urine Extr: wwp, no edema   Results for orders placed or performed during the hospital encounter of 11/17/2020 (from the past 24 hour(s))  Glucose, capillary     Status: Abnormal   Collection Time: 12/04/20 12:01 PM  Result Value Ref Range   Glucose-Capillary 170 (H) 70 - 99 mg/dL  Glucose, capillary     Status: Abnormal   Collection Time: 12/04/20  3:52 PM  Result Value Ref Range   Glucose-Capillary 113 (H) 70 - 99 mg/dL  Glucose, capillary     Status: Abnormal   Collection Time: 12/04/20  9:10 PM  Result Value Ref Range   Glucose-Capillary 121 (H) 70 - 99 mg/dL  Glucose, capillary     Status: Abnormal    Collection Time: 12/04/20 11:24 PM  Result Value Ref Range   Glucose-Capillary 139 (H) 70 - 99 mg/dL  Glucose, capillary     Status: Abnormal   Collection Time: 12/05/20  4:42 AM  Result Value Ref Range   Glucose-Capillary 170 (H) 70 - 99 mg/dL  CBC     Status: Abnormal   Collection Time: 12/05/20  4:46 AM  Result Value Ref Range   WBC 17.5 (H) 4.0 - 10.5 K/uL   RBC 1.94 (L) 4.22 - 5.81 MIL/uL   Hemoglobin 6.5 (LL) 13.0 - 17.0 g/dL   HCT 62.8 (L) 31.5 - 17.6 %   MCV 121.1 (H) 80.0 - 100.0 fL   MCH 33.5 26.0 - 34.0 pg   MCHC 27.7 (L) 30.0 - 36.0 g/dL   RDW 16.0 (H) 73.7 - 10.6 %   Platelets 314 150 - 400 K/uL   nRBC 0.2 0.0 - 0.2 %  Comprehensive metabolic panel     Status: Abnormal   Collection Time: 12/05/20  6:07 AM  Result Value Ref Range   Sodium 142 135 - 145 mmol/L   Potassium 4.3 3.5 - 5.1 mmol/L   Chloride 117 (H) 98 - 111 mmol/L   CO2 17 (L) 22 - 32 mmol/L   Glucose, Bld 151 (H) 70 - 99 mg/dL   BUN 49 (H) 8 - 23 mg/dL   Creatinine, Ser 2.69 0.61 - 1.24 mg/dL   Calcium 7.7 (L) 8.9 -  10.3 mg/dL   Total Protein 5.5 (L) 6.5 - 8.1 g/dL   Albumin 2.2 (L) 3.5 - 5.0 g/dL   AST 20 15 - 41 U/L   ALT 20 0 - 44 U/L   Alkaline Phosphatase 85 38 - 126 U/L   Total Bilirubin 0.9 0.3 - 1.2 mg/dL   GFR, Estimated >60 >60 mL/min   Anion gap 8 5 - 15  Magnesium     Status: None   Collection Time: 12/05/20  6:07 AM  Result Value Ref Range   Magnesium 1.9 1.7 - 2.4 mg/dL  Phosphorus     Status: None   Collection Time: 12/05/20  6:07 AM  Result Value Ref Range   Phosphorus 3.2 2.5 - 4.6 mg/dL  Type and screen [place]     Status: None (Preliminary result)   Collection Time: 12/05/20  6:25 AM  Result Value Ref Range   ABO/RH(D) B POS    Antibody Screen NEG    Sample Expiration 12/08/2020,2359    Unit Number [id number]    Blood Component Type RED CELLS,LR    Unit division 00    Status of Unit ISSUED    Transfusion Status OK TO TRANSFUSE     Crossmatch Result      Compatible Performed at [facility], [address], [place], [place] [address]   Prepare RBC (crossmatch)     Status: None   Collection Time: 12/05/20  6:44 AM  Result Value Ref Range   Order Confirmation      ORDER PROCESSED BY BLOOD BANK Performed at [facility], [address], [place], [place] [address]   Glucose, capillary     Status: None   Collection Time: 12/05/20  8:25 AM  Result Value Ref Range   Glucose-Capillary 96 70 - 99 mg/dL    Assessment & Plan: The plan of care was discussed with the bedside nurse for the day, who is in agreement with this plan and no additional concerns were raised.   Present on Admission: . SBO (small bowel obstruction) ([place]) . Hypernatremia . AKI (acute kidney injury) ([place]) . Lactic acidosis . HTN (hypertension)    LOS: 23 days   Additional comments:I reviewed the patient's new clinical lab test results.   and I reviewed the patients new imaging test results.    SBO -s/p ex lap with SBR, placement of a gastrostomy tube12/7 by Dr. Georgette Dover. Takeback 12/12 by Dr. Zenia Resides for necrotic SB with resection of additional 60-70cm of SB inclusive of prior anastomosis in discontinuity with open abdomen. - S/P ex lap, ileostomy, mucous fistula, closure 12/15 by Dr. [place] Silos Malnourishment/FEN - cont TNA/TFs- prealbumin going up, but stool output is double tube feeds volume despite adding imodium and fiber.  Add DTO.  Pharmacy and dietary evaluating volume status and tube feed formula.  Working on hypernatremia as well.   VDRF- self-extubated 12/13 AM, re-intubated ~3h after. Second time failing extubation,vent settings back down to normal, per CCM.  Will need trach, but current vent settings 80% and 14. Would recommend inversing I:E to try to improve oxygenation. Shock/ID- shockresolved, off pressors;necrotic bowel was most likely source. WBC high but down-trending. Continue Zosyn. Drain as below. Intra-abdominal  abscess - seen on CT 12/23, to IR today for drain placement CV/AF RVR- in NSRcurrently VTE-SQH Dispo - ICU   Jesusita Oka, MD Trauma & General Surgery Please use [email or web address] to contact on call provider  12/05/2020  *Care during the described  time interval was provided by me. I have reviewed this patient's available data, including medical history, events of note, physical examination and test results as part of my evaluation.

## 2020-12-05 NOTE — Progress Notes (Addendum)
NAME:  Ronald Wolf, MRN:  [id number], DOB:  December 22, 1945, LOS: 56 ADMISSION DATE:  12/08/2020, CONSULTATION DATE:  11/17/2020 REFERRING MD: Cyndia Skeeters CHIEF COMPLAINT:  hypotension   Brief History   Ronald Wolf is a 74 yo M w/ PMH of CVA, Seizures, Dementia, Bladder ca s/p urostomy, tobacco use, right glottic carcinoma s/p excision presenting with sbo. Found to have ischemic bowel requiring ex-lap 12/7.  Course complicated by septic shock, acute respiratory failure and AKI return to the OR 12/12 for necrotic small bowel resection of additional 70 cm inclusive of prior anastomosis 12/15 abdominal closure with ileostomy  History of present illness   Ronald Wolf is a 74 yo M w/ above PMH who initially presented to [facility] hospital on 11/11/20 with abdominal pain and nausea of 4-5 day duration. He was found to have SBO but left against medical advice as his family did not feel comfortable with him receiving his medical care in [place]. He came to [facility] ED the next day with similar complaint and was found to have SBO. He was treated with supportive care with bowel rest and ng suction. However his symptoms did not improve. Surgery team was consulted and he was brough to OR for exploratory laparoscopy. He was found to have multiple sections of ischemic bowel which were resected. He was unable to be extubated post-operatively and PCCM was consulted.  Past Medical History  CVA, Dementia, Bladder CA s/p nephrostomy, tobacco use, right glottic carcinoma s/p excision, seizures.  Significant Hospital Events   11/19/2020 Admission 11/20/2020 OR 11/22/2020 Admit to ICU 12/10 Brief SVT with associated hypertension. Self limited Given fentanyl, metop and hydral for HTN 12/11 Good urine output with Lasix , failed extubation, reintubated within 2 hours due to inability to clear secretions 12/12 return OR for enteric leak-- found to have bowel necrosis. Left in discontinuity  12/13 self extubated, reintubated. On  pressors. Family meeting with PCCM and CCS to discuss possible next steps.  12/15-was back in OR, abdominal closure, RUQ mucus fistula and L ileostomy  12/19: No significant overnight events, high ileostomy output 12/21: scheduled for family meeting. Worse hypernatremia, continued high ileostomy output.  Attempted PSV/CPAP wean, pulled volumes of high 100s -low/mid 200s with tachypnea. Placed back of full support.  12/22 ongoing high output GI/ ileostomy, TPN started; weaned 12/5 all day; off fentanyl gtt, remains on precedex   Consults:  General Surgery PCCM  Procedures:  12/05/2020 Ex-lap, G-tube placement ETT 12/7 >> 12/11, >> 12/15- back to OR for abdominal closure, exploratory laparotomy   Significant Diagnostic Tests:   CT abd/pelvis 12/2 > High-grade distal small bowel obstruction, possibly due to adhesion. Mild ascites and diffuse mesenteric edema. No evidence of focal inflammatory process or abscess. Small bilateral pleural effusions and mild right lower lobe Atelectasis. CT abdomen/pelvis 12/23 Interval development of a large loculated collection in the low anterior abdomen and pelvis measuring approximately 6.1 x 11.3 x12.1 cm in size subjacent to the operative site. Few foci of internal gas are present. , no frank spillage of enteric contrast   Micro Data:  12/09/2020 COVID/Flu negative 12/7 BA: Few GNR rare GPC -- predominantly PMN  12/7 BCx> Neg 12/11 respiratory -no organism in tracheal aspirate  Antimicrobials:  Fluc 12/8>> 12/10 Zosyn 12/8> 12/12 cefotetain 12/15  Interim history/subjective:   Critically ill , intubated [place] on Precedex drip Urine output adequate  Objective   Blood pressure 138/69, pulse 91, temperature 98.9 F (37.2 C), temperature source Axillary, resp. rate (!) 33, height  5\' 11"  (1.803 m), weight 59.1 kg, SpO2 100 %.    Vent Mode: PRVC FiO2 (%):  [30 %-40 %] 40 % Set Rate:  [15 bmp] 15 bmp Vt Set:  [450 mL] 450 mL PEEP:  [5 cmH20] 5  cmH20 Plateau Pressure:  [17 cmH20-19 cmH20] 19 cmH20   Intake/Output Summary (Last 24 hours) at 12/05/2020 [id number] Last data filed at 12/05/2020 0600 Gross per 24 hour  Intake 6004.29 ml  Output 4710 ml  Net 1294.29 ml   Filed Weights   12/02/20 0500 12/03/20 0500 12/05/20 0451  Weight: 55.6 kg 55.9 kg 59.1 kg    Examination: General:  Chronically ill/ cachetic frail elderly male  HEENT: MM pink/dry, pupils 3/reactive, ETT, temporal muscle wasting, Neuro: Appears weak and deconditioned, no focal deficits, follows one-step commands CV: rr, no murmur PULM: Tachypneic but no accessory muscle use GI: Purulent drainage around G-tube , mucous fistula and ileostomy look okay, urostomy draining clear urine Extremities: warm/dry, no edema  Skin: no rashes, poor turgor  CXR 12/23 independently reviewed hazy opacity medial right lung base  Labs show normal electrolytes , decrease leukocytosis, drop in hemoglobin from 7.1-6.5  Resolved Hospital Problem list   AKI   Assessment & Plan:   Sepsis/intra-abdominal abscess P Continue Zosyn Surgery to contact IR for drain placement Add Eraxis empiric and obtain blood cultures   Acute encephalopathy, multifactorial   Continue neuro checks  Delirium precautions Using Precedex and as needed fentanyl with goal RASS 0 to -1  Acute hypoxic respiratory failure requiring mechanical ventilation, vdrf  Has been intubated x3 during this hospitalization Pleural effusions, improved - Secondary to malnutrition, low albumin -Has failed extubation multiple times, mentation ongoing barrier for weaning criteria, and now is significantly more debilitated than for prior attempts - do not anticipate will be able to be extubated  P Weaned all day 12/22 on PSV 12/5 Planning for trach next week , if aggressive care still desired Spontaneous breathing trials as able but doubt will make much progress until abscesses drained , abdominal issues  improved    SBO Ischemic bowel perforation  -s/p 2 ex laps, resection of ischemic bowel, mucus fistula and ileostomy, G tube placement  -Purulent drainage around G tube persists P CCS following  TPN per pharmacy  Ongoing midline dressings/ wound care, continue to monitor mucous fistula (dusky NG tube  Hypernatremia, slowing improving AKI   P DC hypotonic fluids Trend renal indices  Strict I/Os  HTN SVT, improved AFib RVR P Resume metoprolol Remains in NSR, poor candidate for anticoagulation   Severe protein calorie malnutrition Continue EN per Gtube and TPN per pharmacy   Hx seizure No clinical evidence of seizures, ongoing seizure precautions Continue Keppra   History of bladder cancer Urostomy care ongoing   Anemia , no evidence of blood loss Transfuse 1 unit PRBC for hemoglobin of 6.5 in anticipation of IR drain procedure CBC in am    Goals of Care -poor baseline due to multiple medical problems, post emergent surgery with slow recovery, failed extubations (12/11, 12/13) due to general deconditioning and inability to clear secretions.  -12/13 Family discussion with CCS and PCCM. Family has decided to pursue all offered aggressive interventions (return OR for abd, trach, full code) -Palliative care consult appreciated for ongoing [place] discussions -Family meeting held again 12/02/20 -- decided they want to proceed with tracheostomy (for more comfort) + ongoing aggressive medical management for now with ongoing [place] discussions -Guarded prognosis given prior debility and ongoing clinical  decline despite aggressive care; Appreciate PMT assistance.  Family to consider changing to DNR over Christmas gatherings.  Meanwhile planning for trach next week  Best practice (evaluated daily)  Diet: EN/ adding TPN Pain/Anxiety/Delirium protocol (if indicated): Fent gtt-> prn, precedex VAP protocol (if indicated): Yes  DVT prophylaxis: SQH  GI prophylaxis: PPI Glucose control:  SSI  Mobility:BR  Family updated - please see lengthy documentation 12/21 Family meeting- last 12/21; ongoing  Code Status: Full Disposition: ICU  Labs   CBC: Recent Labs  Lab 11/30/20 0419 12/01/20 0506 12/02/20 0510 12/03/20 0502 12/04/20 0510 12/05/20 0446  WBC 27.7* 24.1* 18.0* 23.5* 21.0* 17.5*  NEUTROABS 25.2* 19.2*  --  18.0*  --   --   HGB 7.4* 7.7* 8.9* 7.6* 7.1* 6.5*  HCT 25.7* 25.7* 28.6* 24.8* 24.2* 23.5*  MCV 109.8* 105.3* 104.0* 106.4* 107.1* 121.1*  PLT 292 346 339 368 320 [id number]    Basic Metabolic Panel: Recent Labs  Lab 11/29/20 0452 11/30/20 0419 12/01/20 0506 12/02/20 0510 12/03/20 0502 12/04/20 0510 12/05/20 0607  NA 146*   < > 152* 158* 153* 151* 142  K 4.1   < > 3.7 3.8 4.2 4.6 4.3  CL 113*   < > 122* 128* 127* 124* 117*  CO2 26   < > 20* 20* 19* 19* 17*  GLUCOSE 192*   < > 116* 99 166* 236* 151*  BUN 63*   < > 63* 61* 58* 58* 49*  CREATININE 1.08   < > 1.13 1.21 1.49* 1.31* 1.22  CALCIUM 8.1*   < > 8.2* 8.3* 8.3* 7.9* 7.7*  MG 2.2  --   --   --  2.5* 2.4 1.9  PHOS 3.0  --   --   --  3.5 3.4 3.2   < > = values in this interval not displayed.   GFR: Estimated Creatinine Clearance: 44.4 mL/min (by C-G formula based on SCr of 1.22 mg/dL). Recent Labs  Lab 12/02/20 0510 12/03/20 0502 12/04/20 0510 12/05/20 0446  WBC 18.0* 23.5* 21.0* 17.5*    Liver Function Tests: Recent Labs  Lab 12/03/20 0502 12/04/20 0510 12/05/20 0607  AST 28 26 20   ALT 24 27 20   ALKPHOS 152* 131* 85  BILITOT 0.9 1.1 0.9  PROT 5.8* 5.3* 5.5*  ALBUMIN 1.4* 1.3* 2.2*   No results for input(s): LIPASE, AMYLASE in the last 168 hours. No results for input(s): AMMONIA in the last 168 hours.  ABG    Component Value Date/Time   PHART 7.361 11/16/2020 2113   PCO2ART 47.6 11/22/2020 2113   PO2ART 41 (L) 12/01/2020 2113   HCO3 26.9 11/27/2020 2113   TCO2 28 11/22/2020 2113   ACIDBASEDEF 2.0 11/27/2020 1658   O2SAT 72.0 11/30/2020 2113     Coagulation  Profile: No results for input(s): INR, PROTIME in the last 168 hours.  Cardiac Enzymes: No results for input(s): CKTOTAL, CKMB, CKMBINDEX, TROPONINI in the last 168 hours.  HbA1C: Hgb A1c MFr Bld  Date/Time Value Ref Range Status  11/19/2020 10:37 AM 5.0 4.8 - 5.6 % Final    Comment:    (NOTE) Pre diabetes:          5.7%-6.4%  Diabetes:              >6.4%  Glycemic control for   <7.0% adults with diabetes   12/17/2019 08:19 AM 4.5 (L) 4.8 - 5.6 % Final    Comment:    (NOTE) Pre diabetes:  5.7%-6.4% Diabetes:              >6.4% Glycemic control for   <7.0% adults with diabetes    CRITICAL CARE Performed by: Leanna Sato Ason Heslin   Total critical care time: 32 minutes  Kara Mead MD. FCCP. [facility] See Amion for pager  If no response to pager , please call [phone]  After 7:00 pm call Elink  [phone]     12/05/2020, 8:37 AM

## 2020-12-05 NOTE — Progress Notes (Signed)
Palliative:  HPI: 73 yo male with past medical history significant of bladder cancer s/p urostomy, seizure disorder, right glottic cancer s/p excisinon, stroke, vascular dementia, atrial fibrillation, tobacco abuse, hep C, anxiety, depression admitted 11/22/2020 with small bowel obstruction and ischemic bowel. Initially seen at [facility] but left AMA and came to [facility] following day. S/P exploratory laparoscopy and bowel resection 12/7, further bowel resection 12/12 and found to have bowel necrosis, and abd closure and ileostomy 12/15. Hospitalization complicated by recurrent respiratory failure and intubated x 3.Overall adult failure to thrive.   I met today at [place] bedside. No family present. WBC trending up (although down today after initiation of antibiotics). CT shows intra-abd abscess with plans for drain placement per IR today. Hgb down and requiring blood transfusion as well. Otherwise stable with no further changes. Trach to be placed next week unless goals change.   I called and provided update to daughter, Derrick Ravel, today. She expresses understanding and appreciative of update. Family still discussing decisions and will re-discuss code status after the holiday weekend and allow family time to discuss.   All questions/concerns addressed. Emotional support provided.   Exam: Alert and nods head. Thin, frail, cachectic. ETT to vent. No distress. Abd dressing and ostomy bags in place and clean, dry.   Plan:  - Continue to pursue aggressive care.  - Plans for tracheostomy placement.  - Family discussing wishes for code status.   15 min  Vinie Sill, NP Palliative Medicine Team Pager [phone] (Please see [email or web address] for schedule) Team Phone [phone]    Greater than 50%  of this time was spent counseling and coordinating care related to the above assessment and plan

## 2020-12-05 NOTE — H&P (Signed)
Chief Complaint: Abdominal fluid collection  Referring Physician(s): Donnie Mesa  Supervising Physician: Arne Cleveland  Patient Status: [facility] - In-pt  History of Present Illness: Ronald Wolf is a 74 y.o. male with medical issues including CVA, Seizures, Dementia, Bladder cancer s/p urostomy, tobacco use, and right glottic carcinoma s/p excision.  He presented to the ED on 11/25/2020 with small bowel obstruction. He was initially treated conservatively with NGT and bowel rest but he did not improve.  He was taken to the OR on 12/06/2020 and for exploratory laparaotomy.  He was found to have ischemic bowel requiring portions of small bowel resection.  His course complicated by septic shock, acute respiratory failure and AKI.  He was taken back to the OR 12/09/2020 for necrotic small bowel resection of additional 70 cm inclusive of prior anastomosis.   On 12/15 he underwent abdominal closure with ileostomy.  His stool output remains high, 3.6 liters from ileostomy yesterday.   WBC also remains high which prompted CT scan.  CT showed= Interval development of a large loculated collection in the low anterior abdomen and pelvis measuring approximately 6.1 x 11.3 x 12.1 cm in size subjacent to the operative site. Few foci of internal gas are present. While there are multiple adjacent thickened loops of bowel including a segment jejunum with poor wall definition, no frank spillage of enteric contrast media is seen at this time.  We are asked to evaluate him for placement of a drain(s).  He has right and left ostomies and an open midline wound with wet to dry dressing in place.  Also gastrostomy tube RUQ. Tube feeds were stopped at 6 am this morning.  Past Medical History:  Diagnosis Date  . Abdominal pain, other specified site 09/26/2013  . Anxiety   . Arthritis   . Atherosclerosis of arteries 05/13/2013  . Cough 09/26/2013  . Depression   . Eating disorder   . GERD  (gastroesophageal reflux disease)   . Hepatitis C   . Hypertension   . Lesion of liver 04/01/2013  . Loss of weight 06/21/2014  . Medial knee pain 09/26/2013  . Palpitations 06/21/2014  . Shortness of breath dyspnea    with exertion    Past Surgical History:  Procedure Laterality Date  . BOWEL RESECTION  11/24/2020   Procedure: SMALL BOWEL RESECTION;  Surgeon: Donnie Mesa, MD;  Location: [place];  Service: General;;  . GASTROSTOMY Left 12/04/2020   Procedure: INSERTION OF GASTROSTOMY TUBE;  Surgeon: Donnie Mesa, MD;  Location: [place];  Service: General;  Laterality: Left;  . ILEOSTOMY N/A 12/08/2020   Procedure: ILEOSTOMY WITH MUCOUS FISTULA CREATION;  Surgeon: Georganna Skeans, MD;  Location: [place];  Service: General;  Laterality: N/A;  . IR ANGIO INTRA EXTRACRAN SEL COM CAROTID INNOMINATE BILAT MOD SED  09/09/2017  . IR ANGIO VERTEBRAL SEL VERTEBRAL BILAT MOD SED  09/09/2017  . IR RADIOLOGIST EVAL & MGMT  09/07/2017  . LAPAROTOMY N/A 12/07/2020   Procedure: EXPLORATORY LAPAROTOMY;  Surgeon: Donnie Mesa, MD;  Location: [place];  Service: General;  Laterality: N/A;  . LAPAROTOMY N/A 12/04/2020   Procedure: EXPLORATORY LAPAROTOMY SMALL BOWEL RESECTION WITHOUT ANASTOMOSIS WITH APPLICATION OF WOUND VAC;  Surgeon: Dwan Bolt, MD;  Location: [place];  Service: General;  Laterality: N/A;  . LAPAROTOMY N/A 12/03/2020   Procedure: EXPLORATORY LAPAROTOMY AND ABDOMINAL CLOSURE;  Surgeon: Georganna Skeans, MD;  Location: [place];  Service: General;  Laterality: N/A;  . LYSIS OF ADHESION  11/21/2020   Procedure: LYSIS OF ADHESIONS;  Surgeon: Donnie Mesa, MD;  Location: [place];  Service: General;;  . MICROLARYNGOSCOPY WITH CO2 LASER AND EXCISION OF VOCAL CORD LESION N/A 05/14/2016   Procedure: MICROLARYNGOSCOPY WITH CO2 LASER AND EXCISION OF VOCAL CORD LESION;  Surgeon: Melida Quitter, MD;  Location: [place];  Service: ENT;  Laterality: N/A;  Suspended micro laryngoscopy with CO2 laser excision of vocal fold  lesion  . TUMOR REMOVAL  1990   bladder    Allergies: Iohexol  Medications: Prior to Admission medications   Medication Sig Start Date End Date Taking? Authorizing Provider  amLODipine (NORVASC) 10 MG tablet Take 1 tablet (10 mg total) by mouth daily. 02/11/20  Yes Mosie Lukes, MD  atorvastatin (LIPITOR) 80 MG tablet TAKE 1 TABLET(80 MG) BY MOUTH DAILY AT 6 PM 02/11/20  Yes Mosie Lukes, MD  carvedilol (COREG) 3.125 MG tablet Take 1 tablet (3.125 mg total) by mouth 2 (two) times daily with a meal. 02/11/20  Yes Mosie Lukes, MD  clopidogrel (PLAVIX) 75 MG tablet TAKE 1 TABLET(75 MG) BY MOUTH DAILY Patient taking differently: Take 75 mg by mouth daily. TAKE 1 TABLET(75 MG) BY MOUTH DAILY 08/21/20  Yes Mosie Lukes, MD  folic acid (FOLVITE) 1 MG tablet Take 1 tablet (1 mg total) by mouth daily. 02/11/20  Yes Mosie Lukes, MD  levETIRAcetam (KEPPRA) 750 MG tablet TAKE 1 TABLET(750 MG) BY MOUTH TWICE DAILY Patient taking differently: Take 750 mg by mouth 2 (two) times daily.  06/30/20  Yes Mosie Lukes, MD  mirtazapine (REMERON) 15 MG tablet Take 15 mg by mouth at bedtime. 11/04/20  Yes [provider]  tetrahydrozoline-zinc (VISINE-AC) 0.05-0.25 % ophthalmic solution Place 1 drop into both eyes daily as needed (dry eyes).    Yes [provider]     Family History  Problem Relation Age of Onset  . Diabetes Mother   . Arthritis Mother   . Healthy Sister        x 2  . Cancer Brother        stomach  . Dementia Father   . Prostate cancer Neg Hx   . Colon cancer Neg Hx   . Breast cancer Neg Hx   . Heart disease Neg Hx   . Hypertension Neg Hx     Social History   Socioeconomic History  . Marital status: Single    Spouse name: Not on file  . Number of children: 6  . Years of education: 43  . Highest education level: Not on file  Occupational History  . Not on file  Tobacco Use  . Smoking status: Current Every Day Smoker    Packs/day: 2.00    Years:  60.00    Pack years: 120.00    Types: Cigarettes  . Smokeless tobacco: Never Used  Vaping Use  . Vaping Use: Never used  Substance and Sexual Activity  . Alcohol use: Yes    Alcohol/week: 0.0 standard drinks    Comment:  drinks brandy daily  . Drug use: Yes    Types: Marijuana    Comment: "the legal kind"  . Sexual activity: Not on file  Other Topics Concern  . Not on file  Social History Narrative   Patient drinks caffeine occasionally.   Patient is right handed   Lives alone   Wears seat belts   Social Determinants of Health   Financial Resource Strain: Not on file  Food Insecurity: Not  on file  Transportation Needs: Not on file  Physical Activity: Not on file  Stress: Not on file  Social Connections: Not on file    Review of Systems  Unable to perform ROS: Intubated    Vital Signs: BP (!) 145/66   Pulse 76   Temp 98.2 F (36.8 C) (Axillary)   Resp (!) 33   Ht 5\' 11"  (1.803 m)   Wt 59.1 kg   SpO2 100%   BMI 18.17 kg/m   Physical Exam Vitals reviewed.  Constitutional:      Appearance: He is ill-appearing.     Comments: Opens eyes to voice, does not follow commands  HENT:     Head: Normocephalic and atraumatic.  Cardiovascular:     Rate and Rhythm: Normal rate and regular rhythm.  Pulmonary:     Comments: Intubated/vent Coarse breath sounds bilaterally Abdominal:       Comments: Red= midline incision with wet to dry dressing in place Blue = gastrostomy tube Pink = ostomies.  Skin:    General: Skin is warm and dry.  Neurological:     General: No focal deficit present.     Imaging: CT ABDOMEN PELVIS WO CONTRAST  Result Date: 12/04/2020 CLINICAL DATA:  Gastrostomy placement 12/12/2020, small-bowel obstruction EXAM: CT ABDOMEN AND PELVIS WITHOUT CONTRAST TECHNIQUE: Multidetector CT imaging of the abdomen and pelvis was performed following the standard protocol without IV contrast. COMPARISON:  11/13/2020 CT FINDINGS: Lower chest: Subtotal  atelectatic collapse of the left lower lobe. Partial collapse of the right lower lobe as well some adjacent ground-glass within the aerated portions of the lungs could suggest a superimposed infection or aspiration. Small right pleural effusion. Hepatobiliary: Stable hypoattenuating focus in the left lobe liver measuring up to 9 mm in size. Inadequately characterized on this unenhanced CT though statistically likely benign. Normal liver attenuation. Smooth liver surface contour. Gallbladder is partially decompressed and circumferentially thickened with pericholecystic hazy stranding in the gallbladder fossa though some of this may be redistributed from more diffuse inflammatory processes in the abdomen detailed below. Pancreas: No pancreatic ductal dilatation or surrounding inflammatory changes. Spleen: Normal in size. No concerning splenic lesions. Adrenals/Urinary Tract: Lobular thickening of the adrenal glands is unchanged from prior. No visible or contour deforming renal lesion. Bilateral nonobstructing nephroliths seen bilaterally. Patient appears to be post cystoprostatectomy and ileal conduit with urostomy in the right lower quadrant. Stomach/Bowel: Small amount of high attenuation contrast media is seen in the distal thoracic esophagus, correlate for reflux. Percutaneous gastrostomy in place with the anterior stomach well pexy D and C the abdominal wall. Mild gastric wall thickening is nonspecific. Similarly, there is diffuse edematous small bowel thickening and inflammation particularly along a segment of proximal jejunum in the left lower quadrant with an ill-defined bowel wall (3/65) and adjacent air and fluid containing collection. Postsurgical changes from recent small-bowel obstruction, difficult to fully delineate the absence of contrast media. A new right upper quadrant ileostomy is noted. More distal small bowel segments as well as the entirety of the colon appears circumferentially thickened. High  attenuation enteric contrast media does however traverses to the level of the rectum. Additional changes from more remote bowel obstruction as well. Vascular/Lymphatic: Atherosclerotic calcifications within the abdominal aorta and branch vessels. No aneurysm or ectasia. No enlarged abdominopelvic lymph nodes. Reproductive: Patient appears to be post cysto prostatectomy. Penile implant noted. Diffuse edematous changes of the included external genitalia, nonspecific and possibly redistributed edematous change. Other: There are postsurgical changes of the  anterior abdominal wall with wound VAC placement. Percutaneous gastrostomy in the left upper quadrant. Right upper quadrant diverting ileostomy. Right lower quadrant probable urostomy/ileal conduit. There is a new large loculated collection in the low anterior abdomen and pelvis measuring approximately 6.1 x 11.3 x 12.1 cm in size subjacent to the operative site. Few foci of internal gas are present. Insinuates about multiple loops of small bowel and is contiguous with the heterogeneous loops of jejunum in the left lower quadrant where there is irregular thickening and loss of normal bowel wall definition. There is however no frank spillage of enteric contrast media at this time. More diffuse edematous changes are noted throughout the mesentery as well as further free fluid in the abdomen and pelvis. Circumferential body wall edema as well as edematous changes of the external genitalia, as described above. Musculoskeletal: The osseous structures appear diffusely demineralized which may limit detection of small or nondisplaced fractures. No acute osseous abnormality or suspicious osseous lesion. IMPRESSION: 1. Interval development of a large loculated collection in the low anterior abdomen and pelvis measuring approximately 6.1 x 11.3 x 12.1 cm in size subjacent to the operative site. Few foci of internal gas are present. While there are multiple adjacent thickened  loops of bowel including a segment jejunum with poor wall definition, no frank spillage of enteric contrast media is seen at this time. 2. Additional postsurgical changes from recent small-bowel resection for obstruction, difficult to fully delineate the absence of contrast media. A new right upper quadrant ileostomy is noted. More distal small bowel segments as well as the entirety of the colon appears circumferentially thickened. Could reflect nonspecific edema or superimposed infectious/inflammatory enterocolitis. 3. Subtotal atelectatic collapse of the left lower lobe. Partial collapse of the right lower lobe as well as some adjacent ground-glass within the aerated portions of the lungs could suggest a superimposed infection or aspiration. Small right pleural effusion. 4. Circumferential body wall edema as well as edematous changes of the included external genitalia, nonspecific and possibly redistributed edematous change. 5. Anterior vertical midline incision with wound VAC in place. 6. Appropriately positioned percutaneous gastrostomy in the left upper quadrant. Right upper quadrant diverting ileostomy. A remote right lower quadrant presumed ileal conduit/urostomy, correlate for surgical history. 7. Aortic Atherosclerosis (ICD10-I70.0). These results will be called to the ordering clinician or representative by the Radiologist Assistant, and communication documented in the PACS or [employer]. Electronically Signed   By: Lovena Le M.D.   On: 12/04/2020 19:08   CT ABDOMEN PELVIS WO CONTRAST  Result Date: 11/13/2020 CLINICAL DATA:  Bowel obstruction. EXAM: CT ABDOMEN AND PELVIS WITHOUT CONTRAST TECHNIQUE: Multidetector CT imaging of the abdomen and pelvis was performed following the standard protocol without IV contrast. COMPARISON:  None. FINDINGS: Lower chest: Small bilateral pleural effusions and mild right lower lobe atelectasis. Hepatobiliary: Small cyst again noted in the left lobe. No mass  visualized on this unenhanced exam. Gallbladder is unremarkable. No evidence of biliary ductal dilatation. Pancreas: No mass or inflammatory process visualized on this unenhanced exam. Spleen:  Within normal limits in size. Adrenals/Urinary tract: No evidence of urolithiasis or hydronephrosis. Unremarkable unopacified urinary bladder. Stomach/Bowel: Nasogastric tube is seen with tip in the stomach. Markedly dilated small bowel loops with air-fluid levels are seen. There is a transition point seen in the right abdomen in the region of surgical staples with nondilated distal small bowel loops. This is suspicious for adhesion. Mild ascites and diffuse mesenteric edema is noted, however there is no evidence of  focal inflammatory process or abscess. Vascular/Lymphatic: No pathologically enlarged lymph nodes identified. No evidence of abdominal aortic aneurysm. Aortic atherosclerotic calcification noted. Reproductive: No mass or other significant abnormality. Penile prosthesis noted. Other:  None. Musculoskeletal:  No suspicious bone lesions identified. IMPRESSION: High-grade distal small bowel obstruction, possibly due to adhesion. Mild ascites and diffuse mesenteric edema. No evidence of focal inflammatory process or abscess. Small bilateral pleural effusions and mild right lower lobe atelectasis. Aortic Atherosclerosis (ICD10-I70.0). Electronically Signed   By: Marlaine Hind M.D.   On: 11/13/2020 15:23   DG Abd 1 View  Result Date: 11/22/2020 CLINICAL DATA:  Abdominal distension EXAM: ABDOMEN - 1 VIEW COMPARISON:  None. FINDINGS: Esophagogastric tube is positioned with tip and side port below the diaphragm. Percutaneous gastrostomy tube. The stomach is gas filled. General paucity of bowel gas without distention. No obvious free air in the abdomen on supine radiographs. Postoperative findings in the pelvis, likely reflecting prostatectomy. IMPRESSION: 1. Esophagogastric tube is positioned with tip and side port  below the diaphragm. 2.  Percutaneous gastrostomy tube. 3. The stomach is gas filled. General paucity of bowel gas without distention. No obvious free air in the abdomen on supine radiographs. Electronically Signed   By: Eddie Candle M.D.   On: 11/22/2020 14:54   DG Chest Port 1 View  Result Date: 12/04/2020 CLINICAL DATA:  Respiratory failure EXAM: PORTABLE CHEST 1 VIEW COMPARISON:  December 02, 2020. FINDINGS: Endotracheal tube is approximately 3.5 cm above the carina, not substantially changed. Left-sided PICC tip projects at the superior cavoatrial junction, unchanged. Persistent hazy opacity involving the medial right lung base. No visible pleural effusions or pneumothorax. Stable cardiomediastinal silhouette, which is within normal limits. Aortic atherosclerosis. IMPRESSION: 1. No substantial change in lines/tubes, as detailed above. 2. Persistent hazy opacity in the medial right lung base, which may represent atelectasis and/or pneumonia. Electronically Signed   By: Margaretha Sheffield MD   On: 12/04/2020 07:47   DG CHEST PORT 1 VIEW  Result Date: 12/02/2020 CLINICAL DATA:  Endotracheal tube placement EXAM: PORTABLE CHEST 1 VIEW COMPARISON:  Radiograph 12/01/2020 FINDINGS: *Endotracheal tube tip terminates in the mid trachea, 3 cm from the carina. *Transesophageal tube tip and side port terminate below the margin of imaging, beyond the GE junction. *Left upper extremity PICC terminates at the lower SVC/cavoatrial junction. Hazy opacity is again seen in the right lung base partially obscuring the right hemidiaphragm with milder opacity in the left lung base as well. Not significantly changed from comparison exam. No pneumothorax. No pneumothorax or visible effusion. Stable cardiomediastinal contours with a calcified aorta. No acute osseous or soft tissue abnormality. Degenerative changes are present in the imaged spine and shoulders. IMPRESSION: 1. Endotracheal tube 3 cm from the carina. 2.  Transesophageal tube tip and side port terminate below the margin of imaging, beyond the GE junction. 3. Left upper extremity PICC at the superior cavoatrial junction. 4. Bibasilar opacities, similar to prior. 5.  Aortic Atherosclerosis (ICD10-I70.0). Electronically Signed   By: Lovena Le M.D.   On: 12/02/2020 05:38   DG Chest Port 1 View  Result Date: 12/01/2020 CLINICAL DATA:  Intubation.  Bowel resection. EXAM: PORTABLE CHEST 1 VIEW COMPARISON:  11/30/2020. FINDINGS: Endotracheal tube, NG tube, PICC line in stable position. Heart size normal. Bibasilar pulmonary infiltrates/edema again noted without interim change. No pleural effusion or pneumothorax. IMPRESSION: 1. Lines and tubes in stable position. 2. Bibasilar pulmonary infiltrates/edema again noted without interim change. Electronically Signed   By: Marcello Moores  Register  On: 12/01/2020 05:37   DG Chest Port 1 View  Result Date: 11/30/2020 CLINICAL DATA:  Acute respiratory failure. Endotracheally intubated. EXAM: PORTABLE CHEST 1 VIEW COMPARISON:  11/27/2020 FINDINGS: Support lines and tubes in appropriate position. Bibasilar pulmonary opacity shows mild improvement since previous study. No new or worsening areas is of opacity are seen. Heart size is normal. IMPRESSION: Mild improvement in bibasilar pulmonary opacity. Electronically Signed   By: Marlaine Hind M.D.   On: 11/30/2020 10:47   DG CHEST PORT 1 VIEW  Result Date: 11/27/2020 CLINICAL DATA:  Endotracheal tube present. EXAM: PORTABLE CHEST 1 VIEW COMPARISON:  Radiograph earlier today. FINDINGS: The endotracheal tube tip is 5 cm in the carina. Enteric tube tip below the diaphragm not included in the field of view. Left upper extremity central line tip at the atrial caval junction. Improved right lung volumes with decreased rightward mediastinal shift. There are hazy opacities in both lower lung zones. No pneumothorax or pulmonary edema. IMPRESSION: 1. Endotracheal tube tip 5 cm in the  carina. Enteric tube tip below the diaphragm not included in the field of view. Left upper extremity central line in place. 2. Improved right lung volumes with decreased rightward mediastinal shift. 3. Hazy opacities at both lower lung zones consistent with pleural effusions and atelectasis. Electronically Signed   By: Keith Rake M.D.   On: 11/27/2020 17:41   DG CHEST PORT 1 VIEW  Result Date: 11/27/2020 CLINICAL DATA:  Check endotracheal tube placement EXAM: PORTABLE CHEST 1 VIEW COMPARISON:  11/24/2020 FINDINGS: Endotracheal tube is again seen in satisfactory position. Left-sided PICC line is noted at the cavoatrial junction. Gastric catheter extends into the stomach. Cardiac shadow is stable. Right-sided pleural effusion with underlying atelectasis is noted. Left lung remains clear. IMPRESSION: Tubes and lines as described in satisfactory position. Increasing right-sided effusion and basilar atelectasis. Electronically Signed   By: Inez Catalina M.D.   On: 11/27/2020 12:34   DG CHEST PORT 1 VIEW  Result Date: 11/24/2020 CLINICAL DATA:  Intubation. EXAM: PORTABLE CHEST 1 VIEW COMPARISON:  11/22/2020 FINDINGS: The endotracheal tube terminates at the level of the clavicular heads, well above the carina. A left PICC terminates near the superior cavoatrial junction. An enteric tube courses towards the upper abdomen with tip not imaged. The cardiac silhouette is normal in size. Veiling opacities in the lower lungs bilaterally likely reflect a combination of small pleural effusions and atelectasis without suspected significant interval change allowing for differences in patient positioning. No pneumothorax is identified. IMPRESSION: Unchanged small pleural effusions and bibasilar atelectasis. Electronically Signed   By: Logan Bores M.D.   On: 11/24/2020 07:58   Portable Chest x-ray  Result Date: 11/22/2020 CLINICAL DATA:  73 year old male status post intubation. EXAM: PORTABLE CHEST 1 VIEW  COMPARISON:  Earlier the same day FINDINGS: The heart size and mediastinal contours are within normal limits. Unchanged position of indwelling endotracheal tube with the tip in the midthoracic trachea and left upper extremity PICC line with the tip in the right atrium. Enteric tube courses off the inferior aspect of this image. No new focal consolidations. Probable trace bilateral pleural effusions bibasilar subsegmental atelectasis. No pneumothorax. Atherosclerotic calcification of the thoracic aorta. No acute osseous abnormality. IMPRESSION: No significant change from prior. Stable position of endotracheal tube. Similar appearing trace bilateral pleural effusions and bibasilar subsegmental atelectasis. Electronically Signed   By: [name] MD   On: 11/22/2020 14:54   DG CHEST PORT 1 VIEW  Result Date: 11/22/2020 CLINICAL DATA:  Intubation. EXAM: PORTABLE CHEST 1 VIEW COMPARISON:  November 21, 2020. FINDINGS: The heart size and mediastinal contours are within normal limits. Endotracheal and nasogastric tubes are in good position. Left subclavian catheter is noted with distal tip in expected position of cavoatrial junction. No pneumothorax is noted. Bibasilar atelectasis or infiltrates are noted with associated pleural effusions. The visualized skeletal structures are unremarkable. IMPRESSION: Endotracheal and nasogastric tubes in good position. Bibasilar atelectasis or infiltrates are noted with associated pleural effusions. Electronically Signed   By: Marijo Conception M.D.   On: 11/22/2020 08:12   DG CHEST PORT 1 VIEW  Result Date: 11/21/2020 CLINICAL DATA:  Respiratory failure.  Ventilator support. EXAM: PORTABLE CHEST 1 VIEW COMPARISON:  12/10/2020 FINDINGS: Endotracheal tube 6 cm above the carina. Orogastric or nasogastric tube enters the abdomen. Left arm PICC tip in the proximal right atrium. Worsening of bilateral lower lobe volume loss, possibly with bilateral effusions. IMPRESSION: 1.  Worsening of bilateral lower lobe volume loss, possibly with bilateral effusions. 2. Lines and tubes as above. PICC tip appears to be in the right atrium. Electronically Signed   By: Nelson Chimes M.D.   On: 11/21/2020 10:18   DG CHEST PORT 1 VIEW  Result Date: 11/23/2020 CLINICAL DATA:  74 year old male with hypoxia. Abnormal right lung on portable chest earlier today. EXAM: PORTABLE CHEST 1 VIEW COMPARISON:  1423 hours today and earlier. FINDINGS: Portable AP semi upright view at 1817 hours. Improved right lung volume and ventilation from earlier. Endotracheal tube tip remains just below the clavicles. Enteric tube courses to the abdomen, tip not included. Residual veiling opacity in the right lower lung. Allowing for portable technique left lung remains clear. New left upper extremity approach PICC line in place, tip at the lower SVC level below the carina. IMPRESSION: 1. Improved right lung ventilation probably due to resolved mucous plugging from 1423 hours today. Residual veiling right pleural effusion suspected. 2. New left upper extremity approach PICC line with tip at the lower SVC level. Otherwise stable lines and tubes. 3. Left lung remains clear. Electronically Signed   By: Genevie Ann M.D.   On: 11/27/2020 18:54   DG CHEST PORT 1 VIEW  Result Date: 11/25/2020 CLINICAL DATA:  Respiratory failure EXAM: PORTABLE CHEST 1 VIEW COMPARISON:  CT abdomen 11/13/2020, chest x-ray 12/20/2019 FINDINGS: Endotracheal tube tip is about 4.6 cm superior to the carina. Esophageal tube tip below the diaphragm but incompletely visualized. Diffuse opacity in the right thorax with mild shift of mediastinal contents to the right, suspect combination of pleural effusion, atelectasis and volume loss. Left lung is grossly clear. The cardiomediastinal silhouette is obscured. IMPRESSION: 1. Endotracheal tube tip about 4.6 cm superior to the carina. Esophageal tube tip below the diaphragm, incompletely visualized. 2. Diffuse  opacity in the right thorax with shift of mediastinal contents to the right, suspect combination of pleural effusion, atelectasis and volume loss. A central obstructing process could also produce this appearance and chest CT follow-up may be considered. Electronically Signed   By: Donavan Foil M.D.   On: 11/13/2020 16:22   DG Abd 2 Views  Result Date: 11/15/2020 CLINICAL DATA:  Abdominal distension, history of small-bowel obstruction EXAM: ABDOMEN - 2 VIEW COMPARISON:  11/15/2011 FINDINGS: Gastric catheter is again noted within the stomach. Scattered large and small bowel gas is noted. Persistent small bowel dilatation is noted. Ostomy is noted in the right mid abdomen. Degenerative changes of lumbar spine are seen. IMPRESSION: Persistent small-bowel obstruction. Electronically Signed  By: Inez Catalina M.D.   On: 11/15/2020 08:07   DG Abd Portable 1V  Result Date: 11/25/2020 CLINICAL DATA:  NG placement. EXAM: PORTABLE ABDOMEN - 1 VIEW COMPARISON:  11/28/2020 FINDINGS: NG tube tip in the body the stomach. Gastrostomy tube also overlying the region of the body of the stomach. Normal bowel gas pattern.  No dilated bowel loops. IMPRESSION: NG tip in the body the stomach. Gastrostomy tube overlying the body of the stomach. Electronically Signed   By: Franchot Gallo M.D.   On: 11/25/2020 08:03   DG Abd Portable 1V  Result Date: 11/24/2020 CLINICAL DATA:  Concern for retained surgical instrument EXAM: PORTABLE ABDOMEN - 1 VIEW COMPARISON:  11/22/2020 FINDINGS: Nonobstructive bowel gas pattern. Enteric tube and gastrostomy tubes remain unchanged in positioning. Multiple surgical clips within the pelvis. Suture line again seen within the mid right hemiabdomen. No new radiopaque structures seen within the abdomen. Penile implants noted. IMPRESSION: 1. No evidence of retained surgical instrumentation within the abdomen. 2. Nonobstructive bowel gas pattern. These results were called by telephone at the time of  interpretation on 12/11/2020 at 1:11 pm to operating room staff member Malachy Mood, who verbally acknowledged these results. Electronically Signed   By: Davina Poke D.O.   On: 12/08/2020 13:11   DG Abd Portable 1V  Result Date: 11/15/2020 CLINICAL DATA:  Follow up small bowel obstruction EXAM: PORTABLE ABDOMEN - 1 VIEW COMPARISON:  Film from earlier in the same day. FINDINGS: Scattered large and small bowel gas is noted. Spur system small-bowel dilatation is again seen similar to the film from 1 hour previous. Gastric catheter appears to been removed in the interval. IMPRESSION: Persistent small bowel dilatation. Electronically Signed   By: Inez Catalina M.D.   On: 11/15/2020 10:02   DG Abd Portable 1V-Small Bowel Obstruction Protocol-initial, 8 hr delay  Result Date: 11/14/2020 CLINICAL DATA:  Small-bowel obstruction EXAM: PORTABLE ABDOMEN - 1 VIEW COMPARISON:  November 14, 2020 FINDINGS: Dilated loops of small bowel are again noted in the abdomen. These appear essentially unchanged from prior study. There is no definite pneumatosis or free air. There are degenerative changes of the spine and hips. IMPRESSION: No significant interval change in the appearance of the small bowel. Findings are consistent with persistent small bowel obstruction. Electronically Signed   By: Constance Holster M.D.   On: 11/14/2020 21:30   DG Abd Portable 1V  Result Date: 11/14/2020 CLINICAL DATA:  Nasogastric tube with advancement EXAM: PORTABLE ABDOMEN - 1 VIEW COMPARISON:  Earlier today FINDINGS: Advanced enteric tube with tip and side port at the stomach. Small bowel obstruction which is known. Clear lung bases IMPRESSION: Well-positioned enteric tube after advancement. Electronically Signed   By: Monte Fantasia M.D.   On: 11/14/2020 09:22   DG Abd Portable 1V  Result Date: 11/14/2020 CLINICAL DATA:  Small-bowel obstruction. EXAM: PORTABLE ABDOMEN - 1 VIEW COMPARISON:  CT 11/13/2020.  Abdomen 11/13/2020. FINDINGS:  Interim removal of NG tube. Persistent small-bowel distention. No interim change. Colon is nondilated. No free air. Hemidiaphragms incompletely imaged. Degenerative change lumbar spine and both hips. Aortoiliac atherosclerotic vascular calcification. Surgical clips in the pelvis. Penile prosthesis noted. IMPRESSION: Interim removal of NG tube. Persistent small-bowel distention. No interim change. Colon is nondilated. Electronically Signed   By: Marcello Moores  Register   On: 11/14/2020 07:33   DG Abd Portable 1V  Result Date: 11/13/2020 CLINICAL DATA:  Nasogastric tube placement EXAM: PORTABLE ABDOMEN - 1 VIEW COMPARISON:  November 13, 2020 study obtained  earlier in the day FINDINGS: Nasogastric tube tip is in the stomach with the side port at the gastroesophageal junction. There are loops of mildly dilated bowel, similar to earlier in the day without appreciable air-fluid levels. No evident free air. Ostomy right lower quadrant noted. Surgical clips noted in the pelvis. Lung bases are clear. IMPRESSION: Nasogastric tube tip in stomach with side port at gastroesophageal junction. Advise advancing nasogastric tube 5-6 cm. Persistent bowel dilatation which may be indicative of a degree of ileus or bowel obstruction. No evident free air. Right lower quadrant ostomy noted. Electronically Signed   By: Lowella Grip III M.D.   On: 11/13/2020 08:57   DG Abd Portable 1V  Result Date: 11/13/2020 CLINICAL DATA:  Small-bowel obstruction, abdominal pain EXAM: PORTABLE ABDOMEN - 1 VIEW COMPARISON:  12/18/2010 FINDINGS: Multiple gas-filled loops of dilated bowel are seen throughout the mid abdomen. Ostomy appliance noted within the right mid abdomen. Together, the findings are suggestive of a distal small-bowel obstruction. Numerous surgical clips noted within the pelvis bilaterally related to probable iliac lymph node dissection. No gross free intraperitoneal gas. No organomegaly. Osseous structures are age-appropriate.  IMPRESSION: Small-bowel obstruction, new since prior examination. Right lower quadrant ostomy noted. Electronically Signed   By: Fidela Salisbury MD   On: 11/13/2020 06:56   [place] EKG SITE RITE  Result Date: 11/14/2020 If Site Rite image not attached, placement could not be confirmed due to current cardiac rhythm.   Labs:  CBC: Recent Labs    12/02/20 0510 12/03/20 0502 12/04/20 0510 12/05/20 0446  WBC 18.0* 23.5* 21.0* 17.5*  HGB 8.9* 7.6* 7.1* 6.5*  HCT 28.6* 24.8* 24.2* 23.5*  PLT 339 368 320 314    COAGS: Recent Labs    12/16/19 1515  INR 1.0  APTT 24    BMP: Recent Labs    12/20/19 0546 12/21/19 0439 12/27/19 0548 12/31/19 0221 11/20/2020 1830 12/02/20 0510 12/03/20 0502 12/04/20 0510 12/05/20 0607  NA 146* 140 142 138   < > 158* 153* 151* 142  K 2.8* 3.8 4.3 4.4   < > 3.8 4.2 4.6 4.3  CL 115* 110 108 107   < > 128* 127* 124* 117*  CO2 22 19* 25 21*   < > 20* 19* 19* 17*  GLUCOSE 118* 98 98 113*   < > 99 166* 236* 151*  BUN 19 15 22  34*   < > 61* 58* 58* 49*  CALCIUM 8.5* 8.5* 9.0 9.0   < > 8.3* 8.3* 7.9* 7.7*  CREATININE 1.14 0.87 1.02 1.21   < > 1.21 1.49* 1.31* 1.22  GFRNONAA >60 >60 >60 59*   < > >60 49* 57* >60  GFRAA >60 >60 >60 >60  --   --   --   --   --    < > = values in this interval not displayed.    LIVER FUNCTION TESTS: Recent Labs    11/27/20 0810 12/03/20 0502 12/04/20 0510 12/05/20 0607  BILITOT 1.6* 0.9 1.1 0.9  AST 26 28 26 20   ALT 21 24 27 20   ALKPHOS 83 152* 131* 85  PROT 4.7* 5.8* 5.3* 5.5*  ALBUMIN 1.4* 1.4* 1.3* 2.2*    TUMOR MARKERS: No results for input(s): AFPTM, CEA, CA199, CHROMGRNA in the last 8760 hours.  Assessment and Plan:  Interval development of a large loculated collection in the low anterior abdomen and pelvis measuring approximately 6.1 x 11.3 x 12.1 cm in size subjacent to the operative site.  Images reviewed by Dr. Vernard Gambles. Will proceed with image guided placement of a drain today.  Risks and  benefits of drain placement were discussed with the patient's brother including bleeding, infection, damage to adjacent structures, bowel perforation/fistula connection, and sepsis.  All of the patient's questions were answered, patient is agreeable to proceed. Consent signed and in chart.  Thank you for this interesting consult.  I greatly enjoyed meeting Ronald Wolf and look forward to participating in their care.  A copy of this report was sent to the requesting provider on this date.  Electronically Signed: Murrell Redden, PA-C   12/05/2020, 11:48 AM      I spent a total of 40 Minutes in face to face in clinical consultation, greater than 50% of which was counseling/coordinating care for drain placement.

## 2020-12-05 NOTE — Progress Notes (Signed)
CRITICAL VALUE ALERT  Critical Value: 6.5HgB  Date & Time Notied:  12/24 0615  Provider Notified: Lynnae Prude RN  Orders Received/Actions taken: Awaiting new orders. Type and screen ordered and completed.

## 2020-12-05 NOTE — Progress Notes (Addendum)
eLink Physician-Brief Progress Note Patient Name: Ronald Wolf DOB: Sep 05, 1946 MRN: [id number]   Date of Service  12/05/2020  HPI/Events of Note  Hemoglobin 6.5 gm / dl.  eICU Interventions  Transfuse 1 unit PRBC. I obtained informed consent  for the PRBC transfusion from patient's sister Ronald Wolf.        Kerry Kass Rj Pedrosa 12/05/2020, 6:41 AM

## 2020-12-05 NOTE — Progress Notes (Signed)
PHARMACY - TOTAL PARENTERAL NUTRITION CONSULT NOTE  Indication: Small bowel obstruction, prolonged ileus  Patient Measurements: Height: 5\' 11"  (180.3 cm) Weight: 61.2 kg (134 lb 14.7 oz) IBW/kg (Calculated) : 75.3 TPN AdjBW (KG): 60.8 Body mass index is 18.82 kg/m. Usual Weight: 137 lbs  Assessment:  74 YOM presented 11/29/2020 with nausea, vomiting, abdominal pain after leaving [facility]MA at [facility] with known SBO. Patient has been on bowel rest with NG tube decompression. Patient continued to have persistent SBO on imaging with no evidence of improvement, now s/p ex-lap with LoA, SBR and placement of Gtube on 11/24/2020. Pharmacy consulted for TPN management - patient received TPN 11/19/20 through 11/29/20.  Reconsulted to resume TPN on 12/21 despite tube feeds at goal rate due to high ostomy output and concern for tube feeds not absorbing. Patient on loperamide and fiber without improvement in output.   Glucose / Insulin: no hx DM - CBGs <180 on TPN - utilized 9u SSI in last 24hrs Electrolytes: Na 151>142 (reduced in TPN yesterday, on FW incr to 200 q4h), Cl down to 117, CO2 down to 17, others WNL Renal: SCr back down to 1.22, BUN down to 49 LFTs / TGs: LFTs / Tbili / TG WNL Prealbumin / albumin: Prealbumin 5.5>14.6 (12/22) / albumin 2.2  Intake / Output; MIVF: UOP 1 ml/kg/hr, high ileostomy output 362mL (down), net -11L (stable); LBM 12/23. 1/2NS at 100 ml/hr. GI Imaging: 12/11 DG Abd - stomach gas-filled, without distention, no obvious free air 12/23 CT abd pelvis - large loculated collection in low abd/pelvis, few foci of internal gas present, new RUQ ileostomy, thickened distal SB/colon, subtotal atelectatic collapse of LLL, partial collapse of RLL, small R pleural effusion, body wall edema Surgeries / Procedures: 12/12: re-exploration and washout with new copious drainage from incision concerning for enteric leak vs. enterotomy >> now s/p, bowel necrosis resected 12/15: ex-lap with ileostomy  with fistula creation, abdominal closure  Central access: PICC placed 12/09/2020 TPN start date: 11/19/20>>11/29/20; restart 12/03/20 >>  Nutritional Goals (updated per RD rec 12/21): 2300-2700 kCal, 120-135g protein, >2L fluid per day  Goal TPN rate of 100 cc/hr will provide 120g AA, 432g CHO, and 36g lipids (15% of total kCal due to [employer]) for a total of 2308 kcal, meeting 100% of needs  Current Nutrition:  TPN Osmolite 1.5 reduced to 40 ml/h (1185 ml per day) Prosource TF 45 ml TID  Plan:  - Increase TPN to 75 ml/hr at 1800. - TPN will provide 90g protein, 324g CHO, 27g SMOF lipids (~15% of total kcal due to [employer]), and 1732 kcal meeting ~75% of estimated needs - Electrolytes in TPN: continue same today - Na 15 mEq/L, K 25 mEq/L, 3 mEq/L of Ca, 3 mEq/L of Mg, 32mmol/L of Phos, max acetate (lytes will increase with rate increase) - Add standard MVI and trace elements + folic acid (d/c per tube order) to TPN - Continue moderate SSI q4h and adjust as needed - Continue FW 200 PT q4h for now per MD and d/c fluids for now - Monitor TPN labs - Will follow along with plans - right now to continue full tube feeds and await reduction in output and bowel reabsorption while titrating up TPN to ~75% goal rate per discussion with RD/MD 12/23   [name], PharmD, BCPS Please check AMION for all [facility] contact numbers Clinical Pharmacist 12/05/2020 8:19 AM

## 2020-12-06 ENCOUNTER — Inpatient Hospital Stay (HOSPITAL_COMMUNITY): Payer: Medicare PPO

## 2020-12-06 DIAGNOSIS — N179 Acute kidney failure, unspecified: Secondary | ICD-10-CM | POA: Diagnosis not present

## 2020-12-06 DIAGNOSIS — J9601 Acute respiratory failure with hypoxia: Secondary | ICD-10-CM | POA: Diagnosis not present

## 2020-12-06 DIAGNOSIS — K651 Peritoneal abscess: Secondary | ICD-10-CM | POA: Diagnosis not present

## 2020-12-06 LAB — BLOOD CULTURE ID PANEL (REFLEXED) - BCID2

## 2020-12-06 LAB — COMPREHENSIVE METABOLIC PANEL
ALT: 15 U/L (ref 0–44)
AST: 17 U/L (ref 15–41)
Albumin: 2.3 g/dL — ABNORMAL LOW (ref 3.5–5.0)
Alkaline Phosphatase: 67 U/L (ref 38–126)
Anion gap: 6 (ref 5–15)
BUN: 49 mg/dL — ABNORMAL HIGH (ref 8–23)
CO2: 18 mmol/L — ABNORMAL LOW (ref 22–32)
Calcium: 7.9 mg/dL — ABNORMAL LOW (ref 8.9–10.3)
Chloride: 118 mmol/L — ABNORMAL HIGH (ref 98–111)
Creatinine, Ser: 1.31 mg/dL — ABNORMAL HIGH (ref 0.61–1.24)
GFR, Estimated: 57 mL/min — ABNORMAL LOW (ref >60)
Glucose, Bld: 133 mg/dL — ABNORMAL HIGH (ref 70–99)
Potassium: 4.4 mmol/L (ref 3.5–5.1)
Sodium: 142 mmol/L (ref 135–145)
Total Bilirubin: 1.3 mg/dL — ABNORMAL HIGH (ref 0.3–1.2)
Total Protein: 5.6 g/dL — ABNORMAL LOW (ref 6.5–8.1)

## 2020-12-06 LAB — GLUCOSE, CAPILLARY
Glucose-Capillary: 106 mg/dL — ABNORMAL HIGH (ref 70–99)
Glucose-Capillary: 116 mg/dL — ABNORMAL HIGH (ref 70–99)
Glucose-Capillary: 124 mg/dL — ABNORMAL HIGH (ref 70–99)
Glucose-Capillary: 126 mg/dL — ABNORMAL HIGH (ref 70–99)
Glucose-Capillary: 138 mg/dL — ABNORMAL HIGH (ref 70–99)
Glucose-Capillary: 141 mg/dL — ABNORMAL HIGH (ref 70–99)
Glucose-Capillary: 168 mg/dL — ABNORMAL HIGH (ref 70–99)

## 2020-12-06 LAB — TYPE AND SCREEN
ABO/RH(D): B POS
Antibody Screen: NEGATIVE
Unit division: 0

## 2020-12-06 LAB — PHOSPHORUS: Phosphorus: 3.2 mg/dL (ref 2.5–4.6)

## 2020-12-06 LAB — CBC
HCT: 26.5 % — ABNORMAL LOW (ref 39.0–52.0)
Hemoglobin: 8.5 g/dL — ABNORMAL LOW (ref 13.0–17.0)
MCH: 30.6 pg (ref 26.0–34.0)
MCHC: 32.1 g/dL (ref 30.0–36.0)
MCV: 95.3 fL (ref 80.0–100.0)
Platelets: 269 10*3/uL (ref 150–400)
RBC: 2.78 MIL/uL — ABNORMAL LOW (ref 4.22–5.81)
RDW: 21 % — ABNORMAL HIGH (ref 11.5–15.5)
WBC: 18.4 10*3/uL — ABNORMAL HIGH (ref 4.0–10.5)
nRBC: 0.2 % (ref 0.0–0.2)

## 2020-12-06 LAB — BPAM RBC
Blood Product Expiration Date: 202201132359
ISSUE DATE / TIME: 202112240817
Unit Type and Rh: 7300

## 2020-12-06 LAB — MAGNESIUM: Magnesium: 1.9 mg/dL (ref 1.7–2.4)

## 2020-12-06 MED ORDER — INSULIN ASPART 100 UNIT/ML ~~LOC~~ SOLN
0.0000 [IU] | Freq: Three times a day (TID) | SUBCUTANEOUS | Status: DC
  Administered 2020-12-06 – 2020-12-07 (×3): 2 [IU] via SUBCUTANEOUS
  Administered 2020-12-08: 14:00:00 3 [IU] via SUBCUTANEOUS
  Administered 2020-12-08 – 2020-12-09 (×4): 2 [IU] via SUBCUTANEOUS
  Administered 2020-12-09 – 2020-12-11 (×4): 3 [IU] via SUBCUTANEOUS
  Administered 2020-12-11: 2 [IU] via SUBCUTANEOUS
  Administered 2020-12-11: 09:00:00 3 [IU] via SUBCUTANEOUS
  Administered 2020-12-11 – 2020-12-12 (×4): 2 [IU] via SUBCUTANEOUS
  Administered 2020-12-13: 3 [IU] via SUBCUTANEOUS
  Administered 2020-12-13: 2 [IU] via SUBCUTANEOUS
  Administered 2020-12-14: 3 [IU] via SUBCUTANEOUS
  Administered 2020-12-14: 2 [IU] via SUBCUTANEOUS
  Administered 2020-12-14: 8 [IU] via SUBCUTANEOUS
  Administered 2020-12-15 – 2020-12-16 (×5): 3 [IU] via SUBCUTANEOUS

## 2020-12-06 MED ORDER — TRAVASOL 10 % IV SOLN
INTRAVENOUS | Status: AC
  Filled 2020-12-06: qty 1200

## 2020-12-06 NOTE — Progress Notes (Signed)
NAME:  Ronald Wolf, MRN:  [id number], DOB:  1946/08/28, LOS: 24 ADMISSION DATE:  12/07/2020, CONSULTATION DATE:  11/21/2020 REFERRING MD: Cyndia Skeeters CHIEF COMPLAINT:  hypotension   Brief History   Mr.Dun is a 74 yo M w/ PMH of CVA, Seizures, Dementia, Bladder ca s/p urostomy, tobacco use, right glottic carcinoma s/p excision presenting with sbo. Found to have ischemic bowel requiring ex-lap 12/7.  Course complicated by septic shock, acute respiratory failure and AKI return to the OR 12/12 for necrotic small bowel resection of additional 70 cm inclusive of prior anastomosis 12/15 abdominal closure with ileostomy  History of present illness   Mr.Disbrow is a 74 yo M w/ above PMH who initially presented to [facility] hospital on 11/11/20 with abdominal pain and nausea of 4-5 day duration. He was found to have SBO but left against medical advice as his family did not feel comfortable with him receiving his medical care in [place]. He came to [facility] ED the next day with similar complaint and was found to have SBO. He was treated with supportive care with bowel rest and ng suction. However his symptoms did not improve. Surgery team was consulted and he was brough to OR for exploratory laparoscopy. He was found to have multiple sections of ischemic bowel which were resected. He was unable to be extubated post-operatively and PCCM was consulted.  Past Medical History  CVA, Dementia, Bladder CA s/p nephrostomy, tobacco use, right glottic carcinoma s/p excision, seizures.  Significant Hospital Events   11/21/2020 Admission 11/26/2020 OR 11/29/2020 Admit to ICU 12/10 Brief SVT with associated hypertension. Self limited Given fentanyl, metop and hydral for HTN 12/11 Good urine output with Lasix , failed extubation, reintubated within 2 hours due to inability to clear secretions 12/12 return OR for enteric leak-- found to have bowel necrosis. Left in discontinuity  12/13 self extubated, reintubated. On  pressors. Family meeting with PCCM and CCS to discuss possible next steps.  12/15-was back in OR, abdominal closure, RUQ mucus fistula and L ileostomy  12/19: No significant overnight events, high ileostomy output 12/21: scheduled for family meeting. Worse hypernatremia, continued high ileostomy output.  Attempted PSV/CPAP wean, pulled volumes of high 100s -low/mid 200s with tachypnea. Placed back of full support.  12/22 ongoing high output GI/ ileostomy, TPN started; weaned 12/5 all day; off fentanyl gtt, remains on precedex  12/24 IR drain , 1 unit PRBC transfusion for hemoglobin 6.5  Consults:  General Surgery PCCM  Procedures:  11/18/20 Ex-lap, G-tube placement ETT 12/7 >> 12/11, >> 12/15- back to OR for abdominal closure, exploratory laparotomy  12/24 CT-guided right pelvic abscess drain  Significant Diagnostic Tests:   CT abd/pelvis 12/2 > High-grade distal small bowel obstruction, possibly due to adhesion. Mild ascites and diffuse mesenteric edema. No evidence of focal inflammatory process or abscess. Small bilateral pleural effusions and mild right lower lobe Atelectasis. CT abdomen/pelvis 12/23 Interval development of a large loculated collection in the low anterior abdomen and pelvis measuring approximately 6.1 x 11.3 x12.1 cm in size subjacent to the operative site. Few foci of internal gas are present. , no frank spillage of enteric contrast   Micro Data:  11/25/2020 COVID/Flu negative 12/7 BA: Few GNR rare GPC -- predominantly PMN  12/7 BCx> Neg 12/11 respiratory -no organism in tracheal aspirate 12/24 BC >> 12/24 abscess >>  Antimicrobials:  Fluc 12/8>> 12/10 Zosyn 12/8> 12/12 cefotetain 12/15>> off Zosyn 12/23 >> eraxis 12/24 >>  Interim history/subjective:   Drain placed by  IR yesterday, uneventful procedure Remains critically ill, intubated  On Precedex and low-dose fentanyl for sedation  Objective   Blood pressure 116/60, pulse 72, temperature 98.5 F (36.9  C), temperature source Axillary, resp. rate (!) 27, height 5\' 11"  (1.803 m), weight 59.3 kg, SpO2 100 %.    Vent Mode: PSV;CPAP FiO2 (%):  [40 %] 40 % Set Rate:  [14 bmp-15 bmp] 14 bmp Vt Set:  [450 mL-600 mL] 600 mL PEEP:  [5 cmH20] 5 cmH20 Pressure Support:  [12 cmH20] 12 cmH20 Plateau Pressure:  [18 cmH20-23 cmH20] 21 cmH20   Intake/Output Summary (Last 24 hours) at 12/06/2020 1150 Last data filed at 12/06/2020 0500 Gross per 24 hour  Intake 3661.53 ml  Output 2100 ml  Net 1561.53 ml   Filed Weights   12/03/20 0500 12/05/20 0451 12/06/20 0500  Weight: 55.9 kg 59.1 kg 59.3 kg    Examination: General:  Chronically ill/ cachetic frail elderly male  HEENT: MM pink/dry, pupils 3/reactive, oral ETT, temporal muscle wasting, Neuro: Follows one-step commands, eyes open, weak and deconditioned CV: rr, no murmur PULM: Tachypneic but no accessory muscle use GI: Purulent drainage around G-tube , mucous fistula and ileostomy pink, urostomy draining clear urine , new IR drain with purulent brown Extremities: warm/dry, no edema  Skin: no rashes, poor turgor  CXR 12/24 independently reviewed -bilateral lower lobe haziness?  Effusions versus infiltrate  Labs show normal electrolytes , slight increase in leukocytosis, hemoglobin improved from 6.5-8.5 after 1 unit PRBC  Resolved Hospital Problem list   AKI   Assessment & Plan:   Sepsis/intra-abdominal abscess S/p IR drain P Continue Zosyn  Added Eraxis empiric , high risk for fungemia-can stop if blood cultures negative    Acute encephalopathy, multifactorial   Continue neuro checks  Delirium precautions Using Precedex and as needed fentanyl with goal RASS 0 to -1  Acute hypoxic respiratory failure requiring mechanical ventilation, vdrf  Has been intubated x3 during this hospitalization Pleural effusions, improved - Secondary to malnutrition, low albumin -Has failed extubation multiple times, significantly more debilitated  than for prior attempts - do not anticipate will be able to be successfully extubated  P Tolerates pressure support weaning 12/5 Spontaneous breathing trials as able but doubt will make much progress until  abdominal issues improved Planning for trach next week , if aggressive care still desired    SBO Ischemic bowel perforation  -s/p 2 ex laps, resection of ischemic bowel, mucus fistula and ileostomy, G tube placement  -Purulent drainage around G tube persists P CCS following  TPN per pharmacy  Ongoing midline dressings/ wound care, continue to monitor ostomies   AKI  P Trend renal indices  Avoid nephrotoxins Strict I/Os  HTN SVT, improved AFib RVR P Resume metoprolol Remains in NSR, poor candidate for anticoagulation   Severe protein calorie malnutrition Continue EN per Gtube and TPN per pharmacy   Hx seizure No clinical evidence of seizures, ongoing seizure precautions Continue Keppra   History of bladder cancer Urostomy care ongoing   Anemia , no evidence of blood loss s/p 1 unit PRBC on 12/24 Goal hemoglobin 7 or above   Goals of Care -poor baseline due to multiple medical problems, post emergent surgery with slow recovery, failed extubations (12/11, 12/13) due to general deconditioning and inability to clear secretions.  -12/13 Family discussion with CCS and PCCM. Family has decided to pursue all offered aggressive interventions (return OR for abd, trach, full code) -Palliative care consult appreciated for ongoing [place] discussions -  Family meeting held again 12/02/20 -- decided they want to proceed with tracheostomy (for more comfort) + ongoing aggressive medical management for now with ongoing [place] discussions -Guarded prognosis given prior debility and ongoing clinical decline despite aggressive care; Appreciate PMT assistance.  Family to consider changing to DNR over Christmas gatherings.  Reassess with them on Monday about tracheostomy, also need surgical  prognosis clearly explained to them  Best practice (evaluated daily)  Diet: EN/ adding TPN Pain/Anxiety/Delirium protocol (if indicated): Fent gtt-> prn, precedex VAP protocol (if indicated): Yes  DVT prophylaxis: SQH  GI prophylaxis: PPI Glucose control: SSI  Mobility:BR  Family updated - please see lengthy documentation 12/21 Family meeting- last 12/21; ongoing  Code Status: Full Disposition: ICU  Labs   CBC: Recent Labs  Lab 11/30/20 0419 12/01/20 0506 12/02/20 0510 12/03/20 0502 12/04/20 0510 12/05/20 0446 12/06/20 0103  WBC 27.7* 24.1* 18.0* 23.5* 21.0* 17.5* 18.4*  NEUTROABS 25.2* 19.2*  --  18.0*  --   --   --   HGB 7.4* 7.7* 8.9* 7.6* 7.1* 6.5* 8.5*  HCT 25.7* 25.7* 28.6* 24.8* 24.2* 23.5* 26.5*  MCV 109.8* 105.3* 104.0* 106.4* 107.1* 121.1* 95.3  PLT 292 346 339 368 320 314 [id number]    Basic Metabolic Panel: Recent Labs  Lab 12/02/20 0510 12/03/20 0502 12/04/20 0510 12/05/20 0607 12/06/20 0103  NA 158* 153* 151* 142 142  K 3.8 4.2 4.6 4.3 4.4  CL 128* 127* 124* 117* 118*  CO2 20* 19* 19* 17* 18*  GLUCOSE 99 166* 236* 151* 133*  BUN 61* 58* 58* 49* 49*  CREATININE 1.21 1.49* 1.31* 1.22 1.31*  CALCIUM 8.3* 8.3* 7.9* 7.7* 7.9*  MG  --  2.5* 2.4 1.9 1.9  PHOS  --  3.5 3.4 3.2 3.2   GFR: Estimated Creatinine Clearance: 41.5 mL/min (A) (by C-G formula based on SCr of 1.31 mg/dL (H)). Recent Labs  Lab 12/03/20 0502 12/04/20 0510 12/05/20 0446 12/06/20 0103  WBC 23.5* 21.0* 17.5* 18.4*    Liver Function Tests: Recent Labs  Lab 12/03/20 0502 12/04/20 0510 12/05/20 0607 12/06/20 0103  AST 28 26 20 17   ALT 24 27 20 15   ALKPHOS 152* 131* 85 67  BILITOT 0.9 1.1 0.9 1.3*  PROT 5.8* 5.3* 5.5* 5.6*  ALBUMIN 1.4* 1.3* 2.2* 2.3*   No results for input(s): LIPASE, AMYLASE in the last 168 hours. No results for input(s): AMMONIA in the last 168 hours.  ABG    Component Value Date/Time   PHART 7.361 12/04/2020 2113   PCO2ART 47.6 11/28/2020 2113    PO2ART 41 (L) 12/09/2020 2113   HCO3 26.9 11/25/2020 2113   TCO2 28 11/29/2020 2113   ACIDBASEDEF 2.0 11/30/2020 1658   O2SAT 72.0 12/08/2020 2113     Coagulation Profile: No results for input(s): INR, PROTIME in the last 168 hours.  Cardiac Enzymes: No results for input(s): CKTOTAL, CKMB, CKMBINDEX, TROPONINI in the last 168 hours.  HbA1C: Hgb A1c MFr Bld  Date/Time Value Ref Range Status  11/19/2020 10:37 AM 5.0 4.8 - 5.6 % Final    Comment:    (NOTE) Pre diabetes:          5.7%-6.4%  Diabetes:              >6.4%  Glycemic control for   <7.0% adults with diabetes   12/17/2019 08:19 AM 4.5 (L) 4.8 - 5.6 % Final    Comment:    (NOTE) Pre diabetes:  5.7%-6.4% Diabetes:              >6.4% Glycemic control for   <7.0% adults with diabetes    CRITICAL CARE Performed by: Leanna Sato [place]   Patient critically ill due to abdominal abscess, respiratory failure Interventions to address this today vent weaning, antibiotics and antifungals Risk of deterioration without these interventions is high  I personally spent 31 minutes providing critical care not including any separately billable procedures   Kara Mead MD. FCCP. [facility] See Amion for pager  If no response to pager , please call [phone]  After 7:00 pm call Elink  [phone]     12/06/2020, 11:50 AM

## 2020-12-06 NOTE — Progress Notes (Signed)
PHARMACY - TOTAL PARENTERAL NUTRITION CONSULT NOTE  Indication: Small bowel obstruction, prolonged ileus  Patient Measurements: Height: 5\' 11"  (180.3 cm) Weight: 61.2 kg (134 lb 14.7 oz) IBW/kg (Calculated) : 75.3 TPN AdjBW (KG): 60.8 Body mass index is 18.82 kg/m. Usual Weight: 137 lbs  Assessment:  74 YOM presented 12/08/2020 with nausea, vomiting, abdominal pain after leaving AMA at [facility] with known SBO. Patient has been on bowel rest with NG tube decompression. Patient continued to have persistent SBO on imaging with no evidence of improvement, now s/p ex-lap with LoA, SBR and placement of Gtube on 11/17/2020. Pharmacy consulted for TPN management - patient received TPN 11/19/20 through 11/29/20.  Reconsulted to resume TPN on 12/21 despite tube feeds at goal rate due to high ostomy output and concern for tube feeds not absorbing. Patient on loperamide and fiber without improvement in output.   Glucose / Insulin: no hx DM - CBGs <180 on TPN - utilized 4u SSI in last 24hrs Electrolytes: Na 142, Cl 118 (reduced in TPN 12/23, on FW incr to 200 q4h),  CO2 18 on bicarb gtt, CoCa 9.26, others WNL Renal: Scr 1.31, BUN 49 LFTs / TGs: LFTs wnl, Tbili 1.3, TG 83 Prealbumin / albumin: Prealbumin 5.5>14.6 (12/22) / albumin 2.3  Intake / Output; MIVF: UOP 1.2 ml/kg/hr, high ileostomy output 3629mL> 613ml (added fiber), net -10.5L (stable); off MIVF GI Imaging:  12/11 DG Abd - stomach gas-filled, without distention, no obvious free air 12/23 CT abd pelvis - large loculated collection in low abd/pelvis, few foci of internal gas present, new RUQ ileostomy, thickened distal SB/colon, subtotal atelectatic collapse of LLL, partial collapse of RLL, small R pleural effusion, body wall edema Surgeries / Procedures: 12/12: re-exploration and washout with new copious drainage from incision concerning for enteric leak vs. enterotomy >> now s/p, bowel necrosis resected 12/15: ex-lap with ileostomy with fistula  creation, abdominal closure 12/24 Pelvic abscess drain  Central access: PICC placed 12/03/2020 TPN start date: 11/19/20>>11/29/20; restart 12/03/20 >>  Nutritional Goals (updated per RD rec 12/21): 2300-2700 kCal, 120-135g protein, >2L fluid per day  Goal TPN rate of 100 cc/hr will provide 120g AA, 432g CHO, and 36g lipids (15% of total kCal due to [employer]) for a total of 2308 kcal, meeting 100% of needs  Current Nutrition:  TPN Osmolite 1.5 reduced to 40 ml/h (1185 ml per day) 12/25 TF held for foul drain output, high stool output  Plan:  - Increase TPN to 100 ml/hr at 1800  - TPN will provide 120g protein, 432g CHO, 36g SMOF lipids (~15% of total kcal due to [employer]), and 2308 kcal meeting 100% of estimated needs - Electrolytes in TPN: adjust to keep same amounts: 18 mEq/L of K, 5 mEq/L of Mg, 11 mmol/L of Phos; Continue Na 15 mEq/L (minimal amount required for ingredients), 2 mEq/L of Ca, max acetate  - Add standard MVI and trace elements + folic acid   - Decrease moderate SSI to q8h and adjust as needed - Continue FW 200 PT q4h per MD  - Monitor TPN labs - F/u TF restart and tolerability, adjust TPN as needed    Benetta Spar, PharmD, BCPS, BCCP Clinical Pharmacist  Please check AMION for all [place] phone numbers After 10:00 PM, call [place]

## 2020-12-06 NOTE — Progress Notes (Signed)
[facility] Progress Note Patient Name: Ronald Wolf DOB: [birth year] MRN: [id number]   Date of Service  12/06/2020  HPI/Events of Note  Restraint renewal requested  eICU Interventions  Ordered     Intervention Category Minor Interventions: Routine modifications to care plan (e.g. PRN medications for pain, fever)  Darrin Apodaca G Maeli Spacek 12/06/2020, 1:00 AM

## 2020-12-06 NOTE — Progress Notes (Signed)
PHARMACY - PHYSICIAN COMMUNICATION CRITICAL VALUE ALERT - BLOOD CULTURE IDENTIFICATION (BCID)  Ronald Wolf is an 74 y.o. male who presented to [facility] on 11/21/2020 with a chief complaint of sepsis 2/2 intra-abdominal abscess.   Assessment:  1o4 bottles growing staphylococcus epidermidis, mecA resistance detected  Name of physician (or Provider) Contacted: Dr. Elsworth Soho   Current antibiotics: Zosyn   Changes to prescribed antibiotics recommended:  Patient is on recommended antibiotics - No changes needed  No results found for this or any previous visit.  [name] 12/06/2020  8:57 PM

## 2020-12-06 NOTE — Progress Notes (Signed)
General Surgery Follow Up Note  Subjective:    Overnight Issues:  Drain placed in IR with foul output.  On minimal sedation.  BP marginal.  Doing trials of pressure support.     Objective:  Vital signs for last 24 hours: Temp:  [98 F (36.7 C)-98.6 F (37 C)] 98.5 F (36.9 C) (12/25 0800) Pulse Rate:  [65-89] 67 (12/25 0800) Resp:  [21-40] 24 (12/25 0800) BP: (88-169)/(51-74) 92/54 (12/25 0800) SpO2:  [100 %] 100 % (12/25 0823) FiO2 (%):  [40 %] 40 % (12/25 0823) Weight:  [59.3 kg] 59.3 kg (12/25 0500)   Intake/Output from previous day: 12/24 0701 - 12/25 0700 In: 4109.4 [I.V.:1905.8; NG/GT:1600; IV Piggyback:603.6] Out: 2400 [Urine:1750; Stool:650]  Intake/Output this shift: No intake/output data recorded.  Vent settings for last 24 hours: Vent Mode: PSV;CPAP FiO2 (%):  [40 %] 40 % Set Rate:  [14 bmp-15 bmp] 14 bmp Vt Set:  [450 mL-600 mL] 600 mL PEEP:  [5 cmH20] 5 cmH20 Pressure Support:  [12 cmH20] 12 cmH20 Plateau Pressure:  [18 cmH20-23 cmH20] 21 cmH20  Physical Exam:  Gen: comfortable, no distress, sedation. Neuro: grossly non-focal, does not follow commands HEENT: intubated Neck: supple CV: RRR Pulm: unlabored breathing, mechanically ventilated on PSV, no distress.   Abd: soft, appropriately TTP, midline wound with some fibrinous exudate, ostomy more pink with watery output, mucous fistula dusky but unchanged, urostomy productive, g-tube draining with bile staining around it.  RLQ/suprapubic drain foul, drain output not recorded, but at least 100 mL in bag.   GU: clear, yellow urine Extr: wwp, no edema   Results for orders placed or performed during the hospital encounter of 11/25/2020 (from the past 24 hour(s))  Culture, blood (Routine X 2) w Reflex to ID Panel     Status: None (Preliminary result)   Collection Time: 12/05/20 10:01 AM   Specimen: BLOOD  Result Value Ref Range   Specimen Description BLOOD RIGHT ANTECUBITAL    Special Requests       BOTTLES DRAWN AEROBIC AND ANAEROBIC Blood Culture adequate volume   Culture      NO GROWTH < 12 HOURS Performed at [facility] [address], [place], [place] [address]    Report Status PENDING   Culture, blood (Routine X 2) w Reflex to ID Panel     Status: None (Preliminary result)   Collection Time: 12/05/20 10:13 AM   Specimen: BLOOD RIGHT HAND  Result Value Ref Range   Specimen Description BLOOD RIGHT HAND    Special Requests      BOTTLES DRAWN AEROBIC AND ANAEROBIC Blood Culture adequate volume   Culture      NO GROWTH < 12 HOURS Performed at [facility], [place] [address], [place], [place] [address]    Report Status PENDING   Glucose, capillary     Status: Abnormal   Collection Time: 12/05/20 12:20 PM  Result Value Ref Range   Glucose-Capillary 107 (H) 70 - 99 mg/dL  Glucose, capillary     Status: Abnormal   Collection Time: 12/05/20  4:12 PM  Result Value Ref Range   Glucose-Capillary 121 (H) 70 - 99 mg/dL  Aerobic/Anaerobic Culture (surgical/deep wound)     Status: None (Preliminary result)   Collection Time: 12/05/20  5:30 PM   Specimen: Abscess  Result Value Ref Range   Specimen Description ABSCESS    Special Requests NONE    Gram Stain      ABUNDANT WBC PRESENT,  PREDOMINANTLY PMN ABUNDANT GRAM NEGATIVE RODS MODERATE GRAM POSITIVE COCCI Performed at [facility], [place] [address] [place], [place] [address]    Culture PENDING    Report Status PENDING   Glucose, capillary     Status: Abnormal   Collection Time: 12/05/20  8:01 PM  Result Value Ref Range   Glucose-Capillary 110 (H) 70 - 99 mg/dL  Glucose, capillary     Status: Abnormal   Collection Time: 12/06/20 12:16 AM  Result Value Ref Range   Glucose-Capillary 126 (H) 70 - 99 mg/dL  Comprehensive metabolic panel     Status: Abnormal   Collection Time: 12/06/20  1:03 AM  Result Value Ref Range   Sodium 142 135 - 145 mmol/L   Potassium 4.4 3.5 - 5.1 mmol/L   Chloride 118 (H) 98 - 111  mmol/L   CO2 18 (L) 22 - 32 mmol/L   Glucose, Bld 133 (H) 70 - 99 mg/dL   BUN 49 (H) 8 - 23 mg/dL   Creatinine, Ser 1.31 (H) 0.61 - 1.24 mg/dL   Calcium 7.9 (L) 8.9 - 10.3 mg/dL   Total Protein 5.6 (L) 6.5 - 8.1 g/dL   Albumin 2.3 (L) 3.5 - 5.0 g/dL   AST 17 15 - 41 U/L   ALT 15 0 - 44 U/L   Alkaline Phosphatase 67 38 - 126 U/L   Total Bilirubin 1.3 (H) 0.3 - 1.2 mg/dL   GFR, Estimated 57 (L) >60 mL/min   Anion gap 6 5 - 15  Magnesium     Status: None   Collection Time: 12/06/20  1:03 AM  Result Value Ref Range   Magnesium 1.9 1.7 - 2.4 mg/dL  Phosphorus     Status: None   Collection Time: 12/06/20  1:03 AM  Result Value Ref Range   Phosphorus 3.2 2.5 - 4.6 mg/dL  CBC     Status: Abnormal   Collection Time: 12/06/20  1:03 AM  Result Value Ref Range   WBC 18.4 (H) 4.0 - 10.5 K/uL   RBC 2.78 (L) 4.22 - 5.81 MIL/uL   Hemoglobin 8.5 (L) 13.0 - 17.0 g/dL   HCT 26.5 (L) 39.0 - 52.0 %   MCV 95.3 80.0 - 100.0 fL   MCH 30.6 26.0 - 34.0 pg   MCHC 32.1 30.0 - 36.0 g/dL   RDW 21.0 (H) 11.5 - 15.5 %   Platelets 269 150 - 400 K/uL   nRBC 0.2 0.0 - 0.2 %  Glucose, capillary     Status: Abnormal   Collection Time: 12/06/20  4:18 AM  Result Value Ref Range   Glucose-Capillary 116 (H) 70 - 99 mg/dL  Glucose, capillary     Status: Abnormal   Collection Time: 12/06/20  8:11 AM  Result Value Ref Range   Glucose-Capillary 168 (H) 70 - 99 mg/dL    Assessment & Plan: The plan of care was discussed with the bedside nurse for the day, who is in agreement with this plan and no additional concerns were raised.   Present on Admission: . SBO (small bowel obstruction) ([place]) . Hypernatremia . AKI (acute kidney injury) ([place]) . Lactic acidosis . HTN (hypertension)    LOS: 24 days   Additional comments:I reviewed the patient's new clinical lab test results.   and I reviewed the patients new imaging test results.    SBO -s/p ex lap with SBR, placement of a gastrostomy tube12/7 by Dr. Georgette Dover.  Takeback 12/12 by Dr. Zenia Resides for necrotic SB  with resection of additional 60-70cm of SB inclusive of prior anastomosis in discontinuity with open abdomen.  - S/P ex lap, ileostomy, mucous fistula, closure 12/15 by Dr. [place] Silos Malnourishment/FEN - cont TNA.  Given foul drain output and marginal pressures, will hold on restarting tube feeds until tomorrow.  He appeared to stool out with high output ileostomy double tube feed volume.  If stable, will restart tomorrow at trickle rates.  VDRF- self-extubated 12/13 AM, re-intubated ~3h after. Second time failing extubation,vent settings back down to normal, per CCM.  Will likely need trach. Shock/ID- shockresolved, off pressors;necrotic bowel was most likely source. WBC high remains high. Continue Zosyn. Drain as below. Intra-abdominal abscess - seen on CT 12/23, s/p perc drain by IR 12/24. Gram stain with GNR and GPC.  Await culture. CV/AF RVR- in NSRcurrently VTE-SQH  Dispo - ICU.  PCCM managing vent.  Palliative care and CCM working with family to discuss code status/goals of care after the weekend.  If he improves/makes some progress, they will likely move to trach.  If not, they may consider changing to DNR and possibly comfort care.    Milus Height, MD FACS Surgical Oncology, General Surgery, Trauma and [place] [facility], [place] for weekday/non holidays Check [email or web address] for coverage night/weekend/holidays  Do not use SecureChat as it is not reliable for timely patient care.    12/06/2020  *Care during the described time interval was provided by me. I have reviewed this patient's available data, including medical history, events of note, physical examination and test results as part of my evaluation.

## 2020-12-07 DIAGNOSIS — J9601 Acute respiratory failure with hypoxia: Secondary | ICD-10-CM | POA: Diagnosis not present

## 2020-12-07 DIAGNOSIS — G9340 Encephalopathy, unspecified: Secondary | ICD-10-CM | POA: Diagnosis not present

## 2020-12-07 DIAGNOSIS — K651 Peritoneal abscess: Secondary | ICD-10-CM

## 2020-12-07 DIAGNOSIS — N179 Acute kidney failure, unspecified: Secondary | ICD-10-CM | POA: Diagnosis not present

## 2020-12-07 LAB — BASIC METABOLIC PANEL
Anion gap: 9 (ref 5–15)
BUN: 55 mg/dL — ABNORMAL HIGH (ref 8–23)
CO2: 13 mmol/L — ABNORMAL LOW (ref 22–32)
Calcium: 7.7 mg/dL — ABNORMAL LOW (ref 8.9–10.3)
Chloride: 118 mmol/L — ABNORMAL HIGH (ref 98–111)
Creatinine, Ser: 1.34 mg/dL — ABNORMAL HIGH (ref 0.61–1.24)
GFR, Estimated: 56 mL/min — ABNORMAL LOW (ref >60)
Glucose, Bld: 125 mg/dL — ABNORMAL HIGH (ref 70–99)
Potassium: 3.5 mmol/L (ref 3.5–5.1)
Sodium: 140 mmol/L (ref 135–145)

## 2020-12-07 LAB — CBC
HCT: 23.1 % — ABNORMAL LOW (ref 39.0–52.0)
Hemoglobin: 7.4 g/dL — ABNORMAL LOW (ref 13.0–17.0)
MCH: 31.4 pg (ref 26.0–34.0)
MCHC: 32 g/dL (ref 30.0–36.0)
MCV: 97.9 fL (ref 80.0–100.0)
Platelets: 252 10*3/uL (ref 150–400)
RBC: 2.36 MIL/uL — ABNORMAL LOW (ref 4.22–5.81)
RDW: 19.9 % — ABNORMAL HIGH (ref 11.5–15.5)
WBC: 21.1 10*3/uL — ABNORMAL HIGH (ref 4.0–10.5)
nRBC: 0.1 % (ref 0.0–0.2)

## 2020-12-07 LAB — GLUCOSE, CAPILLARY
Glucose-Capillary: 130 mg/dL — ABNORMAL HIGH (ref 70–99)
Glucose-Capillary: 128 mg/dL — ABNORMAL HIGH (ref 70–99)
Glucose-Capillary: 142 mg/dL — ABNORMAL HIGH (ref 70–99)
Glucose-Capillary: 141 mg/dL — ABNORMAL HIGH (ref 70–99)
Glucose-Capillary: 139 mg/dL — ABNORMAL HIGH (ref 70–99)

## 2020-12-07 LAB — PHOSPHORUS
Phosphorus: 3.2 mg/dL (ref 2.5–4.6)
Phosphorus: 3.1 mg/dL (ref 2.5–4.6)

## 2020-12-07 LAB — MAGNESIUM
Magnesium: 2.1 mg/dL (ref 1.7–2.4)
Magnesium: 2 mg/dL (ref 1.7–2.4)

## 2020-12-07 MED ORDER — VANCOMYCIN HCL 1250 MG/250ML IV SOLN
1250.0000 mg | Freq: Once | INTRAVENOUS | Status: AC
  Administered 2020-12-07: 12:00:00 1250 mg via INTRAVENOUS
  Filled 2020-12-07: qty 250

## 2020-12-07 MED ORDER — VANCOMYCIN HCL 500 MG/100ML IV SOLN: 500.0000 mg | Freq: Two times a day (BID) | INTRAVENOUS | Status: DC

## 2020-12-07 MED ORDER — TRAVASOL 10 % IV SOLN
INTRAVENOUS | Status: AC
  Filled 2020-12-07: qty 1200

## 2020-12-07 MED ORDER — POTASSIUM CHLORIDE 10 MEQ/50ML IV SOLN
10.0000 meq | INTRAVENOUS | Status: AC
Start: 2020-12-07 — End: 2020-12-07
  Administered 2020-12-07 (×3): 10 meq via INTRAVENOUS
  Filled 2020-12-07 (×3): qty 50

## 2020-12-07 NOTE — Progress Notes (Signed)
NAME:  Ronald [facility], MRN:  [id number], DOB:  1946-10-25, LOS: 42 ADMISSION DATE:  11/17/2020, CONSULTATION DATE:  11/15/2020 REFERRING MD: Cyndia Skeeters CHIEF COMPLAINT:  hypotension   Brief History   Ronald Wolf is a 74 yo M w/ PMH of CVA, Seizures, Dementia, Bladder ca s/p urostomy, tobacco use, right glottic carcinoma s/p excision presenting with sbo. Found to have ischemic bowel requiring ex-lap 12/7.  Course complicated by septic shock, acute respiratory failure and AKI return to the OR 12/12 for necrotic small bowel resection of additional 70 cm inclusive of prior anastomosis 12/15 abdominal closure with ileostomy  History of present illness   Ronald Wolf is a 74 yo M w/ above PMH who initially presented to [facility] on 11/11/20 with abdominal pain and nausea of 4-5 day duration. He was found to have SBO but left against medical advice as his family did not feel comfortable with him receiving his medical care in [facility]. He came to [facility] ED the next day with similar complaint and was found to have SBO. He was treated with supportive care with bowel rest and ng suction. However his symptoms did not improve. Surgery team was consulted and he was brough to OR for exploratory laparoscopy. He was found to have multiple sections of ischemic bowel which were resected. He was unable to be extubated post-operatively and [facility] was consulted.  Past Medical History  CVA, Dementia, Bladder CA s/p nephrostomy, tobacco use, right glottic carcinoma s/p excision, seizures.  Significant Wolf Events   12/10/2020 Admission 11/22/2020 OR 12/06/2020 Admit to ICU 12/10 Brief SVT with associated hypertension. Self limited Given fentanyl, metop and hydral for HTN 12/11 Good urine output with Lasix , failed extubation, reintubated within 2 hours due to inability to clear secretions 12/12 return OR for enteric leak-- found to have bowel necrosis. Left in discontinuity  12/13 self extubated, reintubated. On  pressors. Family meeting with [facility] and [facility] to discuss possible next steps.  12/15-was back in OR, abdominal closure, RUQ mucus fistula and L ileostomy  12/19: No significant overnight events, high ileostomy output 12/21: scheduled for family meeting. Worse hypernatremia, continued high ileostomy output.  Attempted PSV/CPAP wean, pulled volumes of high 100s -low/mid 200s with tachypnea. Placed back of full support.  12/22 ongoing high output GI/ ileostomy, TPN started; weaned 12/5 all day; off fentanyl gtt, remains on precedex  12/24 IR drain , 1 unit PRBC transfusion for hemoglobin 6.5 12/26 added vancomycin for GPC in blood cultures and RIC suggesting MRSA Consults:  General Surgery [facility]  Procedures:  11/28/2020 Ex-lap, G-tube placement ETT 12/7 >> 12/11, >> 12/15- back to OR for abdominal closure, exploratory laparotomy  12/24 CT-guided right pelvic abscess drain  Significant Diagnostic Tests:   CT abd/pelvis 12/2 > High-grade distal small bowel obstruction, possibly due to adhesion. Mild ascites and diffuse mesenteric edema. No evidence of focal inflammatory process or abscess. Small bilateral pleural effusions and mild right lower lobe Atelectasis. CT abdomen/pelvis 12/23 Interval development of a large loculated collection in the low anterior abdomen and pelvis measuring approximately 6.1 x 11.3 x12.1 cm in size subjacent to the operative site. Few foci of internal gas are present. , no frank spillage of enteric contrast   Micro Data:  12/05/2020 COVID/Flu negative 12/7 BA: Few GNR rare GPC -- predominantly PMN  12/7 BCx> Neg 12/11 respiratory -no organism in tracheal aspirate 12/24 BC >> RIC showing both staph epidermis and MRSA 12/24 abscess >>  Antimicrobials:  Fluc 12/8>> 12/10 Zosyn 12/8>  12/12 cefotetain 12/15>> off Zosyn 12/23 >> eraxis 12/24 >> Vancomycin 12/26 Interim history/subjective:   Still on low-dose Precedex.  Blood cultures with GPC  Objective   Blood  pressure (Abnormal) 93/49, pulse 75, temperature 98.8 F (37.1 C), temperature source Axillary, resp. rate (Abnormal) 24, height 5\' 11"  (1.803 m), weight 61.5 kg, SpO2 100 %.    Vent Mode: PRVC FiO2 (%):  [40 %] 40 % Set Rate:  [14 bmp] 14 bmp Vt Set:  [600 mL] 600 mL PEEP:  [5 cmH20] 5 cmH20 Plateau Pressure:  [21 cmH20-30 cmH20] 21 cmH20   Intake/Output Summary (Last 24 hours) at 12/07/2020 1104 Last data filed at 12/07/2020 1052 Gross per 24 hour  Intake 3503.35 ml  Output 2400 ml  Net 1103.35 ml   Filed Weights   12/05/20 0451 12/06/20 0500 12/07/20 0500  Weight: 59.1 kg 59.3 kg 61.5 kg    Examination: General 74 year old male resting in bed no acute distress currently full ventilator support HEENT normocephalic atraumatic does exhibit temporal wasting Pulmonary: Clear, equal breath sounds diminished bases Cardiac: Regular rate and rhythm Abdomen: Soft still with purulent drainage around G-tube with mucous fistula ileostomy pink urostomy clear IR drain with purulent brown output Extremities warm dry Neuro sedated Resolved Wolf Problem list   AKI  SVT Assessment & Plan:   Sepsis/intra-abdominal abscess, now with GPC bacteremia S/p IR drain Plan Continue Zosyn, course to be determined Continue IV Eraxis Add IV vancomycin  Acute encephalopathy, multifactorial  Plan Continue serial neuro checks Precedex with RASS goal 0-1   Acute hypoxic respiratory failure requiring mechanical ventilation, vdrf  Has been intubated x3 during this hospitalization Pleural effusions, improved - Secondary to malnutrition, low albumin -Has failed extubation multiple times, significantly more debilitated than for prior attempts - do not anticipate will be able to be successfully extubated  Plan  Continue supportive care  Daily assessment for pressure support but will likely need trach  PAD protocol  VAP bundle  SBO Ischemic bowel perforation  -s/p 2 ex laps, resection of  ischemic bowel, mucus fistula and ileostomy, G tube placement  -Purulent drainage around G tube persists Plan Continue routine dressing and ostomy care  AKI  Serum creatinine holding in the 1.3 range BUN holding in the 50 range Plan Continue strict intake output Renal dose medications Avoid nephrotoxins A.m. chemistry  Nonanion gap metabolic acidosis with hyperchloremia Plan Plan Limit chloride in TPN Continue free water via tube A.m. chemistry  HTN AFib RVR Has been in sinus Plan Continue telemetry monitoring Continue Lopressor Not a candidate for anticoagulation  Severe protein calorie malnutrition Spoke with general surgery today Plan Continue TPN per pharmacy Initiate trickle feeds  Hx seizure Has had no witnessed seizure Plan Seizure precautions Continue Keppra  History of bladder cancer Plan Maintain routine urostomy care  Anemia , no evidence of blood loss s/p 1 unit PRBC on 12/24 Hemoglobin has drifted down approximately 1 g posttransfusion Plan Continue to trend H&H, hemoglobin goal greater than 7  Goals of Care -poor baseline due to multiple medical problems, post emergent surgery with slow recovery, failed extubations (12/11, 12/13) due to general deconditioning and inability to clear secretions.  -12/13 Family discussion with [facility] and [facility]. Family has decided to pursue all offered aggressive interventions (return OR for abd, trach, full code) -Palliative care consult appreciated for ongoing [place] discussions -Family meeting held again 12/02/20 -- decided they want to proceed with tracheostomy (for more comfort) + ongoing aggressive medical management for now with  ongoing [place] discussions -Guarded prognosis given prior debility and ongoing clinical decline despite aggressive care; Appreciate PMT assistance.  Family to consider changing to DNR over Christmas gatherings.  Reassess with them on Monday about tracheostomy, also need surgical prognosis clearly  explained to them  Best practice (evaluated daily)  Diet: EN/ adding TPN Pain/Anxiety/Delirium protocol (if indicated): Fent gtt-> prn, precedex VAP protocol (if indicated): Yes  DVT prophylaxis: SQH  GI prophylaxis: PPI Glucose control: SSI  Mobility:BR  Family updated - please see lengthy documentation 12/21 Family meeting- last 12/21; ongoing  Code Status: Full Disposition: ICU  My cct 34 min  Erick Colace ACNP-BC [place] Pager # [phone] OR # [phone] if no answer   12/07/2020, 11:04 AM

## 2020-12-07 NOTE — Progress Notes (Signed)
PHARMACY - TOTAL PARENTERAL NUTRITION CONSULT NOTE  Indication: Small bowel obstruction, prolonged ileus  Patient Measurements: Height: 5\' 11"  (180.3 cm) Weight: 61.2 kg (134 lb 14.7 oz) IBW/kg (Calculated) : 75.3 TPN AdjBW (KG): 60.8 Body mass index is 18.82 kg/m. Usual Weight: 137 lbs  Assessment:  74 YOM presented 12/08/2020 with nausea, vomiting, abdominal pain after leaving [facility]MA at [facility] with known SBO. Patient has been on bowel rest with NG tube decompression. Patient continued to have persistent SBO on imaging with no evidence of improvement, now s/p ex-lap with LoA, SBR and placement of Gtube on 11/13/2020. Pharmacy consulted for TPN management - patient received TPN 11/19/20 through 11/29/20.  Reconsulted to resume TPN on 12/21 despite tube feeds at goal rate due to high ostomy output and concern for tube feeds not absorbing. Patient on loperamide and fiber without improvement in output.   Glucose / Insulin: no hx DM - CBGs <180 on TPN - utilized 7u SSI in last 24hrs Electrolytes: K 3.5, Na 140, Cl 118 (reduced in TPN 12/23, on FW incr to 200 q4h),  CO2 18, CoCa 9.06, others WNL Renal: Scr 1.34, BUN 55 LFTs / TGs: LFTs wnl, Tbili 1.3, TG 83 Prealbumin / albumin: Prealbumin 5.5>14.6 (12/22) / albumin 2.3  Intake / Output; MIVF: UOP 0.9 ml/kg/hr, high ileostomy output 1084ml (added fiber 12/23), net -10.9L (stable); off MIVF GI Imaging:  12/11 DG Abd - stomach gas-filled, without distention, no obvious free air 12/23 CT abd pelvis - large loculated collection in low abd/pelvis, few foci of internal gas present, new RUQ ileostomy, thickened distal SB/colon, subtotal atelectatic collapse of LLL, partial collapse of RLL, small R pleural effusion, body wall edema Surgeries / Procedures: 12/12: re-exploration and washout with new copious drainage from incision concerning for enteric leak vs. enterotomy >> now s/p, bowel necrosis resected 12/15: ex-lap with ileostomy with fistula creation,  abdominal closure 12/24 Pelvic abscess drain  Central access: PICC placed 11/19/2020 TPN start date: 11/19/20>>11/29/20; restart 12/03/20 >>  Nutritional Goals (updated per RD rec 12/21): 2300-2700 kCal, 120-135g protein, >2L fluid per day  Goal TPN rate of 100 cc/hr will provide 120g AA, 432g CHO, and 36g lipids (15% of total kCal due to [employer]) for a total of 2308 kcal, meeting 100% of needs  Current Nutrition:  TPN Osmolite 1.5 reduced to 40 ml/h (1185 ml per day) - on hold 12/25 TF held for foul drain output, high stool output  Plan:  - Continue TPN to 100 ml/hr at 1800  - TPN will provide 120g protein, 432g CHO, 36g SMOF lipids (~15% of total kcal due to [employer]), and 2308 kcal meeting 100% of estimated needs - Electrolytes in TPN: adjust to keep same amounts: 18 mEq/L of K, 5 mEq/L of Mg, 11 mmol/L of Phos; Continue Na 15 mEq/L (minimal amount required for ingredients), 2 mEq/L of Ca, max acetate  - Add standard MVI and trace elements + folic acid   - Decrease moderate SSI to q8h and adjust as needed - Continue FW 200 PT q4h per MD  - Monitor TPN labs - F/u TF restart and tolerability, adjust TPN as needed    Thank you for involving pharmacy in this patient's care.  [name], PharmD, BCPS Clinical Pharmacist Clinical phone for 12/07/2020 until 3p is [phone] 12/07/2020 7:09 AM  **Pharmacist phone directory can be found on [email or web address] listed under [place]**

## 2020-12-07 NOTE — Progress Notes (Signed)
Referring Physician(s): M. Tseui  Supervising Physician: Oley BalmHassell, Daniel  [facility]  [facility]  Chief Complaint:  Abdominal fluid collection  Brief History:  Ronald Wolf is a 74 y.o. male with medical issues including CVA, Seizures, Dementia, Bladder cancer s/p urostomy, tobacco use, and right glottic carcinoma s/p excision.  He presented to the ED on 12/10/2020 with small bowel obstruction. He was initially treated conservatively with NGT and bowel rest but he did not improve.  He was taken to the OR on 11/22/2020 and for exploratory laparaotomy.  He was found to have ischemic bowel requiring portions of small bowel resection.  His course complicated by septic shock, acute respiratory failure and AKI.  He was taken back to the OR 11/24/2020 for necrotic small bowel resection of additional 70 cm inclusive of prior anastomosis.   On 12/15 he underwent abdominal closure with ileostomy.  His stool output remains high, 3.6 liters from ileostomy yesterday.   WBC also remains high which prompted CT scan.  CT showed= Interval development of a large loculated collection in the low anterior abdomen and pelvis measuring approximately 6.1 x 11.3 x 12.1 cm in size subjacent to the operative site. Few foci of internal gas are present. While there are multiple adjacent thickened loops of bowel including a segment jejunum with poor wall definition, no frank spillage of enteric contrast media is seen at this time.  He underwent placement of a lower abdominal drain yesterday by Dr. Deanne CofferHassell.  Subjective: Remains intubated/vent  Allergies: Iohexol  Medications: Prior to Admission medications   Medication Sig Start Date End Date Taking? Authorizing Provider  amLODipine (NORVASC) 10 MG tablet Take 1 tablet (10 mg total) by mouth daily. 02/11/20  Yes Bradd CanaryBlyth, Stacey A, MD  atorvastatin (LIPITOR) 80 MG tablet TAKE 1 TABLET(80 MG) BY MOUTH DAILY AT 6 PM 02/11/20  Yes Bradd CanaryBlyth,  Stacey A, MD  carvedilol (COREG) 3.125 MG tablet Take 1 tablet (3.125 mg total) by mouth 2 (two) times daily with a meal. 02/11/20  Yes Bradd CanaryBlyth, Stacey A, MD  clopidogrel (PLAVIX) 75 MG tablet TAKE 1 TABLET(75 MG) BY MOUTH DAILY Patient taking differently: Take 75 mg by mouth daily. TAKE 1 TABLET(75 MG) BY MOUTH DAILY 08/21/20  Yes Bradd CanaryBlyth, Stacey A, MD  folic acid (FOLVITE) 1 MG tablet Take 1 tablet (1 mg total) by mouth daily. 02/11/20  Yes Bradd CanaryBlyth, Stacey A, MD  levETIRAcetam (KEPPRA) 750 MG tablet TAKE 1 TABLET(750 MG) BY MOUTH TWICE DAILY Patient taking differently: Take 750 mg by mouth 2 (two) times daily.  06/30/20  Yes Bradd CanaryBlyth, Stacey A, MD  mirtazapine (REMERON) 15 MG tablet Take 15 mg by mouth at bedtime. 11/04/20  Yes [provider]  tetrahydrozoline-zinc (VISINE-AC) 0.05-0.25 % ophthalmic solution Place 1 drop into both eyes daily as needed (dry eyes).    Yes [provider]     Vital Signs: BP (!) 108/53   Pulse 74   Temp 99.4 F (37.4 C) (Axillary)   Resp (!) 27   Ht 5\' 11"  (1.803 m)   Wt 61.5 kg   SpO2 100%   BMI 18.91 kg/m   Physical Exam Constitutional:      Appearance: He is ill-appearing.     Comments: Intubated/vent  HENT:     Head: Normocephalic and atraumatic.  Cardiovascular:     Rate and Rhythm: Normal rate.  Pulmonary:     Effort: Respiratory distress present.  Abdominal:     Comments: Midline dressing in place.  Drain in place, ~100 mL milky pink drainage in bag.  Skin:    General: Skin is warm.     Imaging: CT ABDOMEN PELVIS WO CONTRAST  Result Date: 12/04/2020 CLINICAL DATA:  Gastrostomy placement 11/28/2020, small-bowel obstruction EXAM: CT ABDOMEN AND PELVIS WITHOUT CONTRAST TECHNIQUE: Multidetector CT imaging of the abdomen and pelvis was performed following the standard protocol without IV contrast. COMPARISON:  11/13/2020 CT FINDINGS: Lower chest: Subtotal atelectatic collapse of the left lower lobe. Partial collapse of the right lower  lobe as well some adjacent ground-glass within the aerated portions of the lungs could suggest a superimposed infection or aspiration. Small right pleural effusion. Hepatobiliary: Stable hypoattenuating focus in the left lobe liver measuring up to 9 mm in size. Inadequately characterized on this unenhanced CT though statistically likely benign. Normal liver attenuation. Smooth liver surface contour. Gallbladder is partially decompressed and circumferentially thickened with pericholecystic hazy stranding in the gallbladder fossa though some of this may be redistributed from more diffuse inflammatory processes in the abdomen detailed below. Pancreas: No pancreatic ductal dilatation or surrounding inflammatory changes. Spleen: Normal in size. No concerning splenic lesions. Adrenals/Urinary Tract: Lobular thickening of the adrenal glands is unchanged from prior. No visible or contour deforming renal lesion. Bilateral nonobstructing nephroliths seen bilaterally. Patient appears to be post cystoprostatectomy and ileal conduit with urostomy in the right lower quadrant. Stomach/Bowel: Small amount of high attenuation contrast media is seen in the distal thoracic esophagus, correlate for reflux. Percutaneous gastrostomy in place with the anterior stomach well pexy D and C the abdominal wall. Mild gastric wall thickening is nonspecific. Similarly, there is diffuse edematous small bowel thickening and inflammation particularly along a segment of proximal jejunum in the left lower quadrant with an ill-defined bowel wall (3/65) and adjacent air and fluid containing collection. Postsurgical changes from recent small-bowel obstruction, difficult to fully delineate the absence of contrast media. A new right upper quadrant ileostomy is noted. More distal small bowel segments as well as the entirety of the colon appears circumferentially thickened. High attenuation enteric contrast media does however traverses to the level of the  rectum. Additional changes from more remote bowel obstruction as well. Vascular/Lymphatic: Atherosclerotic calcifications within the abdominal aorta and branch vessels. No aneurysm or ectasia. No enlarged abdominopelvic lymph nodes. Reproductive: Patient appears to be post cysto prostatectomy. Penile implant noted. Diffuse edematous changes of the included external genitalia, nonspecific and possibly redistributed edematous change. Other: There are postsurgical changes of the anterior abdominal wall with wound VAC placement. Percutaneous gastrostomy in the left upper quadrant. Right upper quadrant diverting ileostomy. Right lower quadrant probable urostomy/ileal conduit. There is a new large loculated collection in the low anterior abdomen and pelvis measuring approximately 6.1 x 11.3 x 12.1 cm in size subjacent to the operative site. Few foci of internal gas are present. Insinuates about multiple loops of small bowel and is contiguous with the heterogeneous loops of jejunum in the left lower quadrant where there is irregular thickening and loss of normal bowel wall definition. There is however no frank spillage of enteric contrast media at this time. More diffuse edematous changes are noted throughout the mesentery as well as further free fluid in the abdomen and pelvis. Circumferential body wall edema as well as edematous changes of the external genitalia, as described above. Musculoskeletal: The osseous structures appear diffusely demineralized which may limit detection of small or nondisplaced fractures. No acute osseous abnormality or suspicious osseous lesion. IMPRESSION: 1. Interval development of a large loculated collection in  the low anterior abdomen and pelvis measuring approximately 6.1 x 11.3 x 12.1 cm in size subjacent to the operative site. Few foci of internal gas are present. While there are multiple adjacent thickened loops of bowel including a segment jejunum with poor wall definition, no frank  spillage of enteric contrast media is seen at this time. 2. Additional postsurgical changes from recent small-bowel resection for obstruction, difficult to fully delineate the absence of contrast media. A new right upper quadrant ileostomy is noted. More distal small bowel segments as well as the entirety of the colon appears circumferentially thickened. Could reflect nonspecific edema or superimposed infectious/inflammatory enterocolitis. 3. Subtotal atelectatic collapse of the left lower lobe. Partial collapse of the right lower lobe as well as some adjacent ground-glass within the aerated portions of the lungs could suggest a superimposed infection or aspiration. Small right pleural effusion. 4. Circumferential body wall edema as well as edematous changes of the included external genitalia, nonspecific and possibly redistributed edematous change. 5. Anterior vertical midline incision with wound VAC in place. 6. Appropriately positioned percutaneous gastrostomy in the left upper quadrant. Right upper quadrant diverting ileostomy. A remote right lower quadrant presumed ileal conduit/urostomy, correlate for surgical history. 7. Aortic Atherosclerosis (ICD10-I70.0). These results will be called to the ordering clinician or representative by the Radiologist Assistant, and communication documented in the PACS or [employer]. Electronically Signed   By: Lovena Le M.D.   On: 12/04/2020 19:08   DG Chest Port 1 View  Result Date: 12/06/2020 CLINICAL DATA:  Respiratory failure EXAM: PORTABLE CHEST 1 VIEW COMPARISON:  12/04/2020 FINDINGS: 0521 hours. Endotracheal tube tip is 2.8 cm above the base of the carina. Left PICC line tip overlies the distal SVC. The cardio pericardial silhouette is enlarged. There is pulmonary vascular congestion without overt pulmonary edema. Bibasilar collapse/consolidation with bilateral pleural effusions again noted, progressive in the interval at the left base. IMPRESSION: 1.  Interval progression of left base collapse/consolidation. 2. Otherwise no substantial interval change in exam. Electronically Signed   By: Misty Stanley M.D.   On: 12/06/2020 07:12   DG Chest Port 1 View  Result Date: 12/04/2020 CLINICAL DATA:  Respiratory failure EXAM: PORTABLE CHEST 1 VIEW COMPARISON:  December 02, 2020. FINDINGS: Endotracheal tube is approximately 3.5 cm above the carina, not substantially changed. Left-sided PICC tip projects at the superior cavoatrial junction, unchanged. Persistent hazy opacity involving the medial right lung base. No visible pleural effusions or pneumothorax. Stable cardiomediastinal silhouette, which is within normal limits. Aortic atherosclerosis. IMPRESSION: 1. No substantial change in lines/tubes, as detailed above. 2. Persistent hazy opacity in the medial right lung base, which may represent atelectasis and/or pneumonia. Electronically Signed   By: Margaretha Sheffield MD   On: 12/04/2020 07:47   CT IMAGE GUIDED DRAINAGE BY PERCUTANEOUS CATHETER  Result Date: 12/05/2020 CLINICAL DATA:  Bowel obstruction, post laparotomy. Pelvic fluid collection postop CT. Drainage requested EXAM: CT GUIDED DRAINAGE OF PELVIC ABSCESS ANESTHESIA/SEDATION: Lidocaine 1% subcutaneous PROCEDURE: Select axial scans through the pelvis were obtained. The collection was localized and appropriate skin entry site was determined and marked. The operative field was prepped with chlorhexidinein a sterile fashion, and a sterile drape was applied covering the operative field. A sterile gown and sterile gloves were used for the procedure. Local anesthesia was provided with 1% Lidocaine. Under CT fluoroscopic guidance, 18 gauge trocar needle advanced into the collection. Purulent material could be aspirated. Amplatz guidewire advanced easily within the collection. Tract dilated to facilitate placement of  12 French pigtail drain catheter, formed centrally within the collection. CT demonstrates good  catheter position. 20 mL of the aspirate was sent for Gram stain and culture. Catheter secured externally with 0 Prolene suture and StatLock and placed to gravity drain bag. The patient tolerated the procedure well. COMPLICATIONS: None immediate FINDINGS: Pelvic fluid collection was localized. 12 French pigtail drain catheter placed as above. 20 mL purulent aspirate sample sent for Gram stain and culture. IMPRESSION: Technically successful CT-guided pelvic abscess drain catheter placement. Electronically Signed   By: [name] M.D.   On: 12/05/2020 18:17    Labs:  CBC: Recent Labs    12/04/20 0510 12/05/20 0446 12/06/20 0103 12/07/20 0533  WBC 21.0* 17.5* 18.4* 21.1*  HGB 7.1* 6.5* 8.5* 7.4*  HCT 24.2* 23.5* 26.5* 23.1*  PLT 320 314 269 252    COAGS: Recent Labs    12/16/19 1515  INR 1.0  APTT 24    BMP: Recent Labs    12/20/19 0546 12/21/19 0439 12/27/19 0548 12/31/19 0221 12/05/2020 1830 12/04/20 0510 12/05/20 0607 12/06/20 0103 12/07/20 0533  NA 146* 140 142 138   < > 151* 142 142 140  K 2.8* 3.8 4.3 4.4   < > 4.6 4.3 4.4 3.5  CL 115* 110 108 107   < > 124* 117* 118* 118*  CO2 22 19* 25 21*   < > 19* 17* 18* 13*  GLUCOSE 118* 98 98 113*   < > 236* 151* 133* 125*  BUN 19 15 22  34*   < > 58* 49* 49* 55*  CALCIUM 8.5* 8.5* 9.0 9.0   < > 7.9* 7.7* 7.9* 7.7*  CREATININE 1.14 0.87 1.02 1.21   < > 1.31* 1.22 1.31* 1.34*  GFRNONAA >60 >60 >60 59*   < > 57* >60 57* 56*  GFRAA >60 >60 >60 >60  --   --   --   --   --    < > = values in this interval not displayed.    LIVER FUNCTION TESTS: Recent Labs    12/03/20 0502 12/04/20 0510 12/05/20 0607 12/06/20 0103  BILITOT 0.9 1.1 0.9 1.3*  AST 28 26 20 17   ALT 24 27 20 15   ALKPHOS 152* 131* 85 67  PROT 5.8* 5.3* 5.5* 5.6*  ALBUMIN 1.4* 1.3* 2.2* 2.3*    Assessment and Plan:  Large loculated collection in the low anterior abdomen and pelvis measuring approximately 6.1 x 11.3 x 12.1 cm in size subjacent to the  operative site.  S/P drain placement 12/05/20 by Dr. Vernard Gambles.  Routine drain care. Recommend CT scan when drainage down to 10-15 mL per day.  Electronically Signed: Murrell Redden, PA-C 12/07/2020, 12:31 PM    I spent a total of 15 Minutes at the the patient's bedside AND on the patient's hospital floor or unit, greater than 50% of which was counseling/coordinating care for f/u drain.

## 2020-12-07 NOTE — Progress Notes (Signed)
Pharmacy Antibiotic Note  Ronald Wolf is a 74 y.o. male admitted on 11/27/2020 with pneumonia/intra-abdominal infection.  Pharmacy has been consulted for Zosyn dosing; also adding on vancomycin.  Tm 99.7, WBC up to 21.1, and SCr up slightly to 1.34 (baseline ~1). Pelvic abscess with GNR and GPC. Blood culture with 1 of 4 bottles of MRSE - infectious vs contaminant?  Plan: Continue Zosyn 3.375 g IV q8h to be infused over 4 hours Vancomycin 1250 mg IV load then 500 mg IV q12h Monitor renal function, clinical progress, cultures/sensitivities F/U LOT and de-escalate as able Vancomycin trough as clinically indicated   Height: _0  (180.3 cm) Weight: 61.5 kg (135 lb 9.3 oz) IBW/kg (Calculated) : 75.3  Temp (24hrs), Avg:98.6 F (37 C), Min:98.1 F (36.7 C), Max:99.7 F (37.6 C)  Recent Labs  Lab 12/03/20 0502 12/04/20 0510 12/05/20 0446 12/05/20 0607 12/06/20 0103 12/07/20 0533  WBC 23.5* 21.0* 17.5*  --  18.4* 21.1*  CREATININE 1.49* 1.31*  --  1.22 1.31* 1.34*    Estimated Creatinine Clearance: 42.1 mL/min (A) (by C-G formula based on SCr of 1.34 mg/dL (H)).    Allergies  Allergen Reactions  . Iohexol Hives     Desc: pt broke out in hives years ago from iv contrast. he was given 80m benadryl po, 1 hr prior to ct and did fine today w/o complications.  JB    Zosyn 12/8 >>12/12; 12/23 >> Fluconazole 12/8 >> 12/10 Eraxis 12/24 >> Vancomycin 12/26 >>  12/7 MRSA PCR - negative 12/2 UCx - canceled 12/1 COVID/Flu - negative 12/7 BAL - negative 12/7 BCx - negative 12/11 TA - negative 12/24 BCx - 1 of 4 bottles with MRSE on BCID 12/24 Pelvic abscess- abundant GNR, mod GPC  Thank you for involving pharmacy in this patient's care.  JRenold Genta PharmD, BCPS Clinical Pharmacist Clinical phone for 12/07/2020 until 3p is [id number]/26/2021 11:14 AM  **Pharmacist phone directory can be found on [facility] listed under [place]*

## 2020-12-07 NOTE — Progress Notes (Signed)
General Surgery Follow Up Note  Subjective:    Overnight Issues:   Objective:  Vital signs for last 24 hours: Temp:  [98.1 F (36.7 C)-99.7 F (37.6 C)] 98.8 F (37.1 C) (12/26 0832) Pulse Rate:  [72-123] 79 (12/26 0800) Resp:  [20-36] 36 (12/26 0816) BP: (89-168)/(49-85) 105/56 (12/26 0800) SpO2:  [99 %-100 %] 100 % (12/26 0816) FiO2 (%):  [40 %] 40 % (12/26 0816) Weight:  [61.5 kg] 61.5 kg (12/26 0500)  Hemodynamic parameters for last 24 hours:    Intake/Output from previous day: 12/25 0701 - 12/26 0700 In: 2812.2 [I.V.:2532.1; IV Piggyback:280.1] Out: 2400 [Urine:1400; Stool:1000]  Intake/Output this shift: No intake/output data recorded.  Vent settings for last 24 hours: Vent Mode: PRVC FiO2 (%):  [40 %] 40 % Set Rate:  [14 bmp] 14 bmp Vt Set:  [600 mL] 600 mL PEEP:  [5 cmH20] 5 cmH20 Plateau Pressure:  [21 cmH20-30 cmH20] 21 cmH20  Physical Exam:  Gen: comfortable, no distress Neuro: not f/c HEENT: PERRL Neck: supple CV: RRR Pulm: unlabored breathing Abd: soft, NT, midline wound dressed, ostomy dusky but productive, urostomy productive, mucous fistula pink, g-tube to gravity GU: clear yellow urine via urostomy Extr: wwp, no edema   Results for orders placed or performed during the hospital encounter of 12/10/2020 (from the past 24 hour(s))  Glucose, capillary     Status: Abnormal   Collection Time: 12/06/20 12:05 PM  Result Value Ref Range   Glucose-Capillary 106 (H) 70 - 99 mg/dL  Glucose, capillary     Status: Abnormal   Collection Time: 12/06/20  4:17 PM  Result Value Ref Range   Glucose-Capillary 124 (H) 70 - 99 mg/dL  Glucose, capillary     Status: Abnormal   Collection Time: 12/06/20  8:04 PM  Result Value Ref Range   Glucose-Capillary 141 (H) 70 - 99 mg/dL  Glucose, capillary     Status: Abnormal   Collection Time: 12/06/20 11:16 PM  Result Value Ref Range   Glucose-Capillary 138 (H) 70 - 99 mg/dL  Glucose, capillary     Status: Abnormal    Collection Time: 12/07/20  5:29 AM  Result Value Ref Range   Glucose-Capillary 130 (H) 70 - 99 mg/dL  Basic metabolic panel     Status: Abnormal   Collection Time: 12/07/20  5:33 AM  Result Value Ref Range   Sodium 140 135 - 145 mmol/L   Potassium 3.5 3.5 - 5.1 mmol/L   Chloride 118 (H) 98 - 111 mmol/L   CO2 13 (L) 22 - 32 mmol/L   Glucose, Bld 125 (H) 70 - 99 mg/dL   BUN 55 (H) 8 - 23 mg/dL   Creatinine, Ser 1.34 (H) 0.61 - 1.24 mg/dL   Calcium 7.7 (L) 8.9 - 10.3 mg/dL   GFR, Estimated 56 (L) >60 mL/min   Anion gap 9 5 - 15  CBC     Status: Abnormal   Collection Time: 12/07/20  5:33 AM  Result Value Ref Range   WBC 21.1 (H) 4.0 - 10.5 K/uL   RBC 2.36 (L) 4.22 - 5.81 MIL/uL   Hemoglobin 7.4 (L) 13.0 - 17.0 g/dL   HCT 23.1 (L) 39.0 - 52.0 %   MCV 97.9 80.0 - 100.0 fL   MCH 31.4 26.0 - 34.0 pg   MCHC 32.0 30.0 - 36.0 g/dL   RDW 19.9 (H) 11.5 - 15.5 %   Platelets 252 150 - 400 K/uL   nRBC 0.1 0.0 - 0.2 %  Glucose, capillary     Status: Abnormal   Collection Time: 12/07/20  8:30 AM  Result Value Ref Range   Glucose-Capillary 128 (H) 70 - 99 mg/dL    Assessment & Plan: Present on Admission: . SBO (small bowel obstruction) (HCC) . Hypernatremia . AKI (acute kidney injury) (HCC) . Lactic acidosis . HTN (hypertension)    LOS: 25 days   Additional comments:I reviewed the patient's new clinical lab test results.   and I reviewed the patients new imaging test results.     SBO -s/p ex lap with SBR, placement of a gastrostomy tube12/7 by Dr. Corliss Skains. Takeback 12/12 by Dr. Freida Busman for necrotic SB with resection of additional 60-70cm of SB inclusive of prior anastomosis in discontinuity with open abdomen.  - S/P ex lap, ileostomy, mucous fistula, closure 12/15 by Dr. Janee Morn Malnourishment/FEN- cont TNA. Restart TF at trickle. Ostomy dusky but productive.  VDRF- self-extubated 12/13 AM, re-intubated ~3h after. Second time failing extubation,vent settings back down to normal,  per CCM. Will likely need trach, settings much improved and is likely a candidate.  Shock/ID- shockresolved, off pressors;necrotic bowel was most likely source. WBC high remains high. Continue Zosyn. Drain as below. Intra-abdominal abscess - seen on CT 12/23, s/p perc drain by IR 12/24. Gram stain with GNR and GPC.  Await S&S.  CV/AF RVR- in NSRcurrently VTE-SQH Dispo - ICU.  PCCM managing vent.  Palliative care and CCM working with family to discuss code status/goals of care after the weekend.  If he improves/makes some progress, they will likely move to trach.  If not, they may consider changing to DNR and possibly comfort care.     Diamantina Monks, MD Trauma & General Surgery Please use [email or web address] to contact on call provider  12/07/2020  *Care during the described time interval was provided by me. I have reviewed this patient's available data, including medical history, events of note, physical examination and test results as part of my evaluation.

## 2020-12-08 ENCOUNTER — Inpatient Hospital Stay (HOSPITAL_COMMUNITY): Payer: Medicare PPO

## 2020-12-08 DIAGNOSIS — G9340 Encephalopathy, unspecified: Secondary | ICD-10-CM | POA: Diagnosis not present

## 2020-12-08 DIAGNOSIS — Z9911 Dependence on respirator [ventilator] status: Secondary | ICD-10-CM | POA: Diagnosis not present

## 2020-12-08 DIAGNOSIS — N179 Acute kidney failure, unspecified: Secondary | ICD-10-CM | POA: Diagnosis not present

## 2020-12-08 DIAGNOSIS — J9601 Acute respiratory failure with hypoxia: Secondary | ICD-10-CM | POA: Diagnosis not present

## 2020-12-08 LAB — CBC
HCT: 23.7 % — ABNORMAL LOW (ref 39.0–52.0)
Hemoglobin: 7.1 g/dL — ABNORMAL LOW (ref 13.0–17.0)
MCH: 30 pg (ref 26.0–34.0)
MCHC: 30 g/dL (ref 30.0–36.0)
MCV: 100 fL (ref 80.0–100.0)
Platelets: 245 10*3/uL (ref 150–400)
RBC: 2.37 MIL/uL — ABNORMAL LOW (ref 4.22–5.81)
RDW: 19.5 % — ABNORMAL HIGH (ref 11.5–15.5)
WBC: 23.8 10*3/uL — ABNORMAL HIGH (ref 4.0–10.5)
nRBC: 0.1 % (ref 0.0–0.2)

## 2020-12-08 LAB — BASIC METABOLIC PANEL
Anion gap: 9 (ref 5–15)
BUN: 59 mg/dL — ABNORMAL HIGH (ref 8–23)
CO2: 16 mmol/L — ABNORMAL LOW (ref 22–32)
Calcium: 7.6 mg/dL — ABNORMAL LOW (ref 8.9–10.3)
Chloride: 112 mmol/L — ABNORMAL HIGH (ref 98–111)
Creatinine, Ser: 1.37 mg/dL — ABNORMAL HIGH (ref 0.61–1.24)
GFR, Estimated: 54 mL/min — ABNORMAL LOW (ref >60)
Glucose, Bld: 130 mg/dL — ABNORMAL HIGH (ref 70–99)
Potassium: 3.8 mmol/L (ref 3.5–5.1)
Sodium: 137 mmol/L (ref 135–145)

## 2020-12-08 LAB — COMPREHENSIVE METABOLIC PANEL
ALT: 17 U/L (ref 0–44)
AST: 18 U/L (ref 15–41)
Albumin: 1.6 g/dL — ABNORMAL LOW (ref 3.5–5.0)
Alkaline Phosphatase: 69 U/L (ref 38–126)
Anion gap: 8 (ref 5–15)
BUN: 60 mg/dL — ABNORMAL HIGH (ref 8–23)
CO2: 15 mmol/L — ABNORMAL LOW (ref 22–32)
Calcium: 7.9 mg/dL — ABNORMAL LOW (ref 8.9–10.3)
Chloride: 116 mmol/L — ABNORMAL HIGH (ref 98–111)
Creatinine, Ser: 1.48 mg/dL — ABNORMAL HIGH (ref 0.61–1.24)
GFR, Estimated: 49 mL/min — ABNORMAL LOW (ref >60)
Glucose, Bld: 127 mg/dL — ABNORMAL HIGH (ref 70–99)
Potassium: 3.5 mmol/L (ref 3.5–5.1)
Sodium: 139 mmol/L (ref 135–145)
Total Bilirubin: 1.6 mg/dL — ABNORMAL HIGH (ref 0.3–1.2)
Total Protein: 5.4 g/dL — ABNORMAL LOW (ref 6.5–8.1)

## 2020-12-08 LAB — DIFFERENTIAL
Abs Immature Granulocytes: 1.15 10*3/uL — ABNORMAL HIGH (ref 0.00–0.07)
Basophils Absolute: 0.1 10*3/uL (ref 0.0–0.1)
Basophils Relative: 0 %
Eosinophils Absolute: 0.1 10*3/uL (ref 0.0–0.5)
Eosinophils Relative: 1 %
Immature Granulocytes: 5 %
Lymphocytes Relative: 10 %
Lymphs Abs: 2.5 10*3/uL (ref 0.7–4.0)
Monocytes Absolute: 2.1 10*3/uL — ABNORMAL HIGH (ref 0.1–1.0)
Monocytes Relative: 9 %
Neutro Abs: 17.9 10*3/uL — ABNORMAL HIGH (ref 1.7–7.7)
Neutrophils Relative %: 75 %

## 2020-12-08 LAB — BLOOD GAS, VENOUS
Acid-base deficit: 19.3 mmol/L — ABNORMAL HIGH (ref 0.0–2.0)
Bicarbonate: 9.4 mmol/L — ABNORMAL LOW (ref 20.0–28.0)
FIO2: 40
O2 Saturation: 78.5 %
Patient temperature: 37
pCO2, Ven: 38.3 mmHg — ABNORMAL LOW (ref 44.0–60.0)
pH, Ven: 7.021 — CL (ref 7.250–7.430)
pO2, Ven: 56.6 mmHg — ABNORMAL HIGH (ref 32.0–45.0)

## 2020-12-08 LAB — GLUCOSE, CAPILLARY
Glucose-Capillary: 150 mg/dL — ABNORMAL HIGH (ref 70–99)
Glucose-Capillary: 155 mg/dL — ABNORMAL HIGH (ref 70–99)
Glucose-Capillary: 135 mg/dL — ABNORMAL HIGH (ref 70–99)

## 2020-12-08 LAB — CULTURE, BLOOD (ROUTINE X 2): Special Requests: ADEQUATE

## 2020-12-08 LAB — MAGNESIUM: Magnesium: 2.1 mg/dL (ref 1.7–2.4)

## 2020-12-08 LAB — PREALBUMIN: Prealbumin: 7.7 mg/dL — ABNORMAL LOW (ref 18–38)

## 2020-12-08 LAB — TRIGLYCERIDES: Triglycerides: 58 mg/dL (ref <150)

## 2020-12-08 LAB — PHOSPHORUS: Phosphorus: 3.2 mg/dL (ref 2.5–4.6)

## 2020-12-08 MED ORDER — VANCOMYCIN HCL IN DEXTROSE 1-5 GM/200ML-% IV SOLN
1000.0000 mg | INTRAVENOUS | Status: DC
  Administered 2020-12-08 – 2020-12-11 (×4): 1000 mg via INTRAVENOUS
  Filled 2020-12-08 (×4): qty 200

## 2020-12-08 MED ORDER — STERILE WATER FOR INJECTION IV SOLN
INTRAVENOUS | Status: DC
  Filled 2020-12-08 (×7): qty 850

## 2020-12-08 MED ORDER — TRAVASOL 10 % IV SOLN
INTRAVENOUS | Status: AC
  Filled 2020-12-08: qty 1200

## 2020-12-08 MED ORDER — SODIUM BICARBONATE 8.4 % IV SOLN
50.0000 meq | Freq: Once | INTRAVENOUS | Status: AC
  Administered 2020-12-08: 09:00:00 50 meq via INTRAVENOUS
  Filled 2020-12-08: qty 50

## 2020-12-08 MED ORDER — POTASSIUM CHLORIDE 20 MEQ PO PACK
40.0000 meq | PACK | Freq: Once | ORAL | Status: AC
  Administered 2020-12-08: 06:00:00 40 meq
  Filled 2020-12-08: qty 2

## 2020-12-08 NOTE — Progress Notes (Signed)
Pharmacy Antibiotic Note  Ronald Wolf is a 74 y.o. male admitted on 11/24/2020 with pneumonia/intra-abdominal infection.  Pharmacy has been consulted for Zosyn dosing; also adding on vancomycin.  Staph epi 1/4 in BCx likely contaminant, still with concern for IAI, now with increased purulent TA, Cx sent, and continued WBC uptrend.  Plan: Vancomycin 1000 mg IV q 24h  Cont. Zosyn 3.375g IV every 8 hours (extended 4h infusion) Eraxis 164m IV q 24h Monitor renal function, Cx update and clinical progression to narrow Vancomycin trough as needed   Height: _0  (180.3 cm) Weight: 61.5 kg (135 lb 9.3 oz) IBW/kg (Calculated) : 75.3  Temp (24hrs), Avg:98.4 F (36.9 C), Min:97.9 F (36.6 C), Max:99.4 F (37.4 C)  Recent Labs  Lab 12/04/20 0510 12/05/20 0446 12/05/20 0607 12/06/20 0103 12/07/20 0533 12/08/20 0428  WBC 21.0* 17.5*  --  18.4* 21.1* 23.8*  CREATININE 1.31*  --  1.22 1.31* 1.34* 1.48*    Estimated Creatinine Clearance: 38.1 mL/min (A) (by C-G formula based on SCr of 1.48 mg/dL (H)).    Allergies  Allergen Reactions  . Iohexol Hives     Desc: pt broke out in hives years ago from iv contrast. he was given 514mbenadryl po, 1 hr prior to ct and did fine today w/o complications.  JB    Zosyn 12/8 >>12/12; 12/23 >> Fluconazole 12/8 >> 12/10 Eraxis 12/24 >> Vancomycin 12/26 >>  12/7 MRSA PCR - negative 12/2 UCx - canceled 12/1 COVID/Flu - negative 12/7 BAL - negative 12/7 BCx - negative 12/11 TA - negative 12/24 BCx - 1 of 4 bottles with MRSE on BCID 12/24 Pelvic abscess- abundant GNR, mod GPC 12/27 TA: sent  Thank you for involving pharmacy in this patient's care.  JoBertis RuddyPharmD Clinical Pharmacist Please check AMION for all [facility] 12/08/2020 8:46 AM

## 2020-12-08 NOTE — Progress Notes (Signed)
Nutrition Follow-up  DOCUMENTATION CODES:   Severe malnutrition in context of chronic illness  INTERVENTION:   -TPN management per pharmacy -Continue enteral feedings to promote gut adaptation. TPN helping to meet needs during this time.   Tube feeding via G-tube: Osmolite 1.5 at 10 ml/hr ProSource TF 45 ml TID  Goal: Osmolite 1.5 @ 65 ml/h (1560 ml per day)  Provides 2460 kcal, 130 gm protein, 1185 ml free water daily 200 ml free water every 6 hours Total free water: 2385 ml   -Continue 1 packet Nutrisource fiber TID - provides 9 grams soluble fiber per day  NUTRITION DIAGNOSIS:   Severe Malnutrition related to chronic illness as evidenced by severe fat depletion,severe muscle depletion.  Ongoing  GOAL:   Patient will meet greater than or equal to 90% of their needs  Met with TF + TPN  MONITOR:   I & O's  REASON FOR ASSESSMENT:   Consult Enteral/tube feeding initiation and management  ASSESSMENT:   74 year old male with history of cognitive impairment, CVA, HTN, bladder cancer now with urostomy, right-sided glottic carcinoma s/p excision brought to ED with nausea, vomiting and abdominal pain.  Work-up in ED revealed small bowel obstruction.  He also had AKI and lactic acidosis.  Started on bowel rest with NG tube decompression and IV fluid.  General surgery following.  Subsequent x-rays with persistent SBO.  12/7 s/p ex lap, extensive LOA, SBR, g-tube placement; s/p bronch  12/10 TPN advanced to goal rate - provides: 2120 kcal and 121 grams protein  12/12 back to OR for re-exploration and washout with new copious drainage from incision; pt s/p reopening of recent laparotomy, SBR without reanastomosis, abd washout, placement of VAC 12/15 s/p ex lap, ileostomy, mucous fistula, and closure 12/18 TPN d/c'ed; TF at goal rate 12/22 TPN initiated to support patient due to short bowel  12/23 opium added  12/24 rt pelvic abscess drain placed 12/26- TF re-started  (trickle)  Patient is currently intubated on ventilator support MV: 14.8 L/min Temp (24hrs), Avg:98.1 F (36.7 C), Min:97.9 F (36.6 C), Max:98.5 F (36.9 C)  Reviewed I/O's: +295 ml x 24 hours and -9.4 L since 11/24/20  UOP: 2.2 L x 24 hours  Drain output: 100 ml x 24 hours  Ileostomy output: 1.4 L x 24 hours  Wt trending up. Noted pt has experienced a 12.3# wt gain since last visit (12/04/20).   Trickle feeds were started on 12/07/20 per MD. Osmolite 1.5 infusing @ 10 ml/hr via g-tube. Pt also continues to receive 45 ml Prosource Plus TID and 200 ml free water flush every 4 hours. Current TF regimen providing 480 kcals, 48 grams protein, and 183 ml fluid daily. Total free water: 1383 ml daily.   Pt remains on TPN. Per pharmacy note, plan to continue TPN @ 100 ml/hr, which provides 2380 kcals, and 120 grams protein.   Complete nutrition support regimen providing 2860 kcals and 168 grams protein daily, meeting >100% of estimated kcal and protein needs.   Per MD notes, goals of care discussions continue. Pt family is leaning towards trach, however, may pursue comfort care.   Labs reviewed: CBGS: 150-155 (inpatient orders for glycemic control are 0-15 units inuslin aspart every 4 hours).   Diet Order:   Diet Order            Diet NPO time specified  Diet effective now                 EDUCATION NEEDS:  No education needs have been identified at this time  Skin:  Skin Assessment: Skin Integrity Issues: Skin Integrity Issues:: Incisions Incisions: closed abdomen  Last BM:  12/08/20 (200 ml output via ileostomy)  Height:   Ht Readings from Last 1 Encounters:  11/22/20 5' 11"  (1.803 m)    Weight:   Wt Readings from Last 1 Encounters:  12/07/20 61.5 kg    Ideal Body Weight:  78.2 kg  BMI:  Body mass index is 18.91 kg/m.  Estimated Nutritional Needs:   Kcal:  2300-2700  Protein:  120-135 grams  Fluid:  > 2 L    Loistine Chance, RD, LDN, [place] Registered  Dietitian II Certified Diabetes Care and Education Specialist Please refer to [facility] for RD and/or RD on-call/weekend/after hours pager

## 2020-12-08 NOTE — Consult Note (Signed)
WOC Nurse ostomy follow up Patient intubated, unteachable at this time. Stoma type/location: RUQ mucous fistula, pouch intact. LLQ ileostomy with urine pouch in place.  I explained which pouch to use to primary RN, Wilkie Aye. Supplies had to be ordered for pouch to be changed. RLQ urostomy pouch intact and connected to bedside drainage bag. Stomal assessment/size: moist for all Peristomal assessment: deferred Treatment options for stomal/peristomal skin: barrier ring if needed Output  Ostomy pouching: 1pc./2pc.  Education provided: none Enrolled patient in [employer] Discharge program: No [place] to order additional pouching supplies. Primary RNs to provide pouch changes. Helmut Muster, RN, MSN, CWOCN, CNS-BC, pager [phone]

## 2020-12-08 NOTE — Progress Notes (Signed)
Pt K+ 3.5 with Creat 1.48 and GFR 49. ELink CCM electrolyte protocol initiated

## 2020-12-08 NOTE — Plan of Care (Signed)
Updated sister Gwynneth Macleod via phone. She is concerned about his ability to recover and his quality of life if we proceed with a tracheostomy. His children wish for him to have a tracheostomy to prolong life as much as possible. Their mother had a prolonged illness and was given a poor prognosis, but she was eventually able to recover to an acceptable functional status post-tracheostomy. They feel that they have to give him every shot to prolong his life as much as possible. I also spoke to [place], his daughter who lives in [place], to discuss his care. She understands my concern for chronic malabsorption and the issues with malnutrition, acid-base issues, and dehydration that will likely require him to be SNF-dependent in the future. She will speak to her aunt and let us know their wishes moving forward.   Steffanie Dunn, DO 12/08/20 2:28 PM [facility]

## 2020-12-08 NOTE — Progress Notes (Addendum)
General Surgery Follow Up Note  Subjective:    Overnight Issues:   Objective:  Vital signs for last 24 hours: Temp:  [97.9 F (36.6 C)-99.4 F (37.4 C)] 97.9 F (36.6 C) (12/27 0000) Pulse Rate:  [71-151] 94 (12/27 0805) Resp:  [21-34] 24 (12/27 0700) BP: (93-173)/(49-80) 158/76 (12/27 0805) SpO2:  [100 %] 100 % (12/27 0805) FiO2 (%):  [40 %] 40 % (12/27 0805)  Hemodynamic parameters for last 24 hours:    Intake/Output from previous day: 12/26 0701 - 12/27 0700 In: 3970 [I.V.:2780.3; NG/GT:600; IV Piggyback:589.7] Out: 3675 [Urine:2200; Drains:100; Stool:1375]  Intake/Output this shift: Total I/O In: 124.5 [I.V.:114; IV Piggyback:10.5] Out: 550 [Urine:350; Stool:200]  Vent settings for last 24 hours: Vent Mode: PRVC FiO2 (%):  [40 %] 40 % Set Rate:  [14 bmp] 14 bmp Vt Set:  [600 mL] 600 mL PEEP:  [5 cmH20] 5 cmH20 Plateau Pressure:  [20 cmH20-26 cmH20] 23 cmH20  Physical Exam:  Gen: comfortable, no distress Neuro: not f/c HEENT: PERRL Neck: supple CV: RRR Pulm: unlabored breathing Abd: soft, NT, midline wound with fibrinous exudate and some tissue necrosis but fascia intact, RUQostomy (mucus fistula) dusky with scant SS output, LLQ ostomy (end ileostomy) pink and profuse output, urostomy productive, g-tube site c/d GU: clear yellow urine via urostomy Extr: wwp, no edema   Results for orders placed or performed during the hospital encounter of 11/16/2020 (from the past 24 hour(s))  Glucose, capillary     Status: Abnormal   Collection Time: 12/07/20 11:42 AM  Result Value Ref Range   Glucose-Capillary 142 (H) 70 - 99 mg/dL  Magnesium     Status: None   Collection Time: 12/07/20 12:25 PM  Result Value Ref Range   Magnesium 2.1 1.7 - 2.4 mg/dL  Phosphorus     Status: None   Collection Time: 12/07/20 12:25 PM  Result Value Ref Range   Phosphorus 3.2 2.5 - 4.6 mg/dL  Glucose, capillary     Status: Abnormal   Collection Time: 12/07/20  4:24 PM  Result Value  Ref Range   Glucose-Capillary 141 (H) 70 - 99 mg/dL  Magnesium     Status: None   Collection Time: 12/07/20  4:35 PM  Result Value Ref Range   Magnesium 2.0 1.7 - 2.4 mg/dL  Phosphorus     Status: None   Collection Time: 12/07/20  4:35 PM  Result Value Ref Range   Phosphorus 3.1 2.5 - 4.6 mg/dL  Glucose, capillary     Status: Abnormal   Collection Time: 12/07/20 10:16 PM  Result Value Ref Range   Glucose-Capillary 139 (H) 70 - 99 mg/dL  Comprehensive metabolic panel     Status: Abnormal   Collection Time: 12/08/20  4:28 AM  Result Value Ref Range   Sodium 139 135 - 145 mmol/L   Potassium 3.5 3.5 - 5.1 mmol/L   Chloride 116 (H) 98 - 111 mmol/L   CO2 15 (L) 22 - 32 mmol/L   Glucose, Bld 127 (H) 70 - 99 mg/dL   BUN 60 (H) 8 - 23 mg/dL   Creatinine, Ser 1.48 (H) 0.61 - 1.24 mg/dL   Calcium 7.9 (L) 8.9 - 10.3 mg/dL   Total Protein 5.4 (L) 6.5 - 8.1 g/dL   Albumin 1.6 (L) 3.5 - 5.0 g/dL   AST 18 15 - 41 U/L   ALT 17 0 - 44 U/L   Alkaline Phosphatase 69 38 - 126 U/L   Total Bilirubin 1.6 (H) 0.3 -  1.2 mg/dL   GFR, Estimated 49 (L) >60 mL/min   Anion gap 8 5 - 15  Magnesium     Status: None   Collection Time: 12/08/20  4:28 AM  Result Value Ref Range   Magnesium 2.1 1.7 - 2.4 mg/dL  Phosphorus     Status: None   Collection Time: 12/08/20  4:28 AM  Result Value Ref Range   Phosphorus 3.2 2.5 - 4.6 mg/dL  CBC     Status: Abnormal   Collection Time: 12/08/20  4:28 AM  Result Value Ref Range   WBC 23.8 (H) 4.0 - 10.5 K/uL   RBC 2.37 (L) 4.22 - 5.81 MIL/uL   Hemoglobin 7.1 (L) 13.0 - 17.0 g/dL   HCT 23.7 (L) 39.0 - 52.0 %   MCV 100.0 80.0 - 100.0 fL   MCH 30.0 26.0 - 34.0 pg   MCHC 30.0 30.0 - 36.0 g/dL   RDW 19.5 (H) 11.5 - 15.5 %   Platelets 245 150 - 400 K/uL   nRBC 0.1 0.0 - 0.2 %  Differential     Status: Abnormal   Collection Time: 12/08/20  4:28 AM  Result Value Ref Range   Neutrophils Relative % 75 %   Neutro Abs 17.9 (H) 1.7 - 7.7 K/uL   Lymphocytes Relative 10  %   Lymphs Abs 2.5 0.7 - 4.0 K/uL   Monocytes Relative 9 %   Monocytes Absolute 2.1 (H) 0.1 - 1.0 K/uL   Eosinophils Relative 1 %   Eosinophils Absolute 0.1 0.0 - 0.5 K/uL   Basophils Relative 0 %   Basophils Absolute 0.1 0.0 - 0.1 K/uL   WBC Morphology MILD LEFT SHIFT (1-5% METAS, OCC MYELO, OCC BANDS)    Immature Granulocytes 5 %   Abs Immature Granulocytes 1.15 (H) 0.00 - 0.07 K/uL   Polychromasia PRESENT   Triglycerides     Status: None   Collection Time: 12/08/20  4:28 AM  Result Value Ref Range   Triglycerides 58 <150 mg/dL  Prealbumin     Status: Abnormal   Collection Time: 12/08/20  4:28 AM  Result Value Ref Range   Prealbumin 7.7 (L) 18 - 38 mg/dL  Glucose, capillary     Status: Abnormal   Collection Time: 12/08/20  6:25 AM  Result Value Ref Range   Glucose-Capillary 150 (H) 70 - 99 mg/dL  Blood gas, venous     Status: Abnormal   Collection Time: 12/08/20  8:22 AM  Result Value Ref Range   FIO2 40.00    pH, Ven 7.021 (LL) 7.250 - 7.430   pCO2, Ven 38.3 (L) 44.0 - 60.0 mmHg   pO2, Ven 56.6 (H) 32.0 - 45.0 mmHg   Bicarbonate 9.4 (L) 20.0 - 28.0 mmol/L   Acid-base deficit 19.3 (H) 0.0 - 2.0 mmol/L   O2 Saturation 78.5 %   Patient temperature 37.0    Collection site PIC LINE    Drawn by DRAWN BY RN    Sample type VENOUS     Assessment & Plan: Present on Admission:  SBO (small bowel obstruction) (HCC)  Hypernatremia  AKI (acute kidney injury) ([place])  Lactic acidosis  HTN (hypertension)    LOS: 26 days   Additional comments:I reviewed the patient's new clinical lab test results.   and I reviewed the patients new imaging test results.     SBO -s/p ex lap with SBR, placement of a gastrostomy tube12/7 by Dr. Georgette Dover. Takeback 12/12 by Dr. Zenia Resides for necrotic SB with  resection of additional 60-70cm of SB inclusive of prior anastomosis in discontinuity with open abdomen.  - S/P ex lap, ileostomy, mucous fistula, closure 12/15 by Dr.  Janee Morn Malnourishment/FEN- cont TNA. Restart TF at trickle. Monitor I/O- high output ostomy.  VDRF- self-extubated 12/13 AM, re-intubated ~3h after. Second time failing extubation,vent settings back down to normal, per CCM.  Shock/ID- shockresolved, off pressors;necrotic bowel was most likely source. WBC high remains high. Continue Zosyn. Drain as below. Intra-abdominal abscess - seen on CT 12/23, s/p perc drain by IR 12/24. Gram stain with GNR and GPC.  Await S&S.  CV/AF RVR- in NSRcurrently VTE-SQH Dispo - ICU.  PCCM managing vent.  Palliative care and CCM working with family to discuss code status/goals of care after the weekend.  If he improves/makes some progress, they will likely move to trach.  If not, they may consider changing to DNR and possibly comfort care. I think significant improvement is unlikely- he will have a high output ileostomy and is unlikely to return to pre-admission functional status.    Berna Bue, MD Trauma & General Surgery Please use [email or web address] to contact on call provider  12/08/2020  *Care during the described time interval was provided by me. I have reviewed this patient's available data, including medical history, events of note, physical examination and test results as part of my evaluation.

## 2020-12-08 NOTE — Progress Notes (Signed)
PHARMACY - TOTAL PARENTERAL NUTRITION CONSULT NOTE  Indication: Small bowel obstruction, prolonged ileus  Patient Measurements: Height: 5\' 11"  (180.3 cm) Weight: 61.2 kg (134 lb 14.7 oz) IBW/kg (Calculated) : 75.3 TPN AdjBW (KG): 60.8 Body mass index is 18.82 kg/m. Usual Weight: 137 lbs  Assessment:  74 YOM presented 11/22/2020 with nausea, vomiting, abdominal pain after leaving [facility]MA at [facility] with known SBO. Patient has been on bowel rest with NG tube decompression. Patient continued to have persistent SBO on imaging with no evidence of improvement, now s/p ex-lap with LoA, SBR and placement of Gtube on 12/03/2020. Pharmacy consulted for TPN management - patient received TPN 11/19/20 through 11/29/20.  Reconsulted to resume TPN on 12/21 despite tube feeds at goal rate due to high ostomy output and concern for tube feeds not absorbing. Patient on loperamide and fiber without improvement in output.   Glucose / Insulin: no hx DM - CBGs <180 on TPN - utilized 4 units SSI/24hrs Electrolytes: K 3.5 - 40 PO via electrolyte protocol, Na 139, Cl 116 (reduced in TPN 12/23, on FW incr to 200 q4h),  CO2 15, CoCa 9.82 Renal: Scr 1.48, BUN 60 LFTs / TGs: LFTs and TG wnl, Tbili 1.6 Prealbumin / albumin: Prealbumin down 7.7 / albumin 1.6 Intake / Output; MIVF: UOP 1.5 ml/kg/hr, high ileostomy output 1/24 (on fiber, opium tincture, loperamide and unlikely to improve per Surgery), drain O/P 100 ml, net -9.3L GI Imaging:  12/11 DG Abd - stomach gas-filled, without distention, no obvious free air 12/23 CT abd pelvis - large loculated collection in low abd/pelvis, few foci of internal gas present, new RUQ ileostomy, thickened distal SB/colon, subtotal atelectatic collapse of LLL, partial collapse of RLL, small R pleural effusion, body wall edema Surgeries / Procedures: 12/12: re-exploration and washout with new copious drainage from incision concerning for enteric leak vs. enterotomy >> now s/p, bowel necrosis  resected 12/15: ex-lap with ileostomy with fistula creation, abdominal closure 12/24 Pelvic abscess drain  Central access: PICC placed 11/12/2020 TPN start date: 11/19/20>>11/29/20; restart 12/03/20 >>  Nutritional Goals (updated per RD rec 12/21): 2300-2700 kCal, 120-135g protein, >2L fluid per day  Goal TPN rate of 100 cc/hr will provide 120g AA, 432g CHO, and 36g lipids (15% of total kCal due to 1/22) for a total of 2308 kcal, meeting 100% of needs  Current Nutrition:  TPN Osmolite 1.5 at 10 ml/hr - started ~14:00 on 12/26   12/25 TF held for foul drain output, high stool output ProSource TF 45 ml TID (each 11g AA and 40 kcal)  Plan:  - Continue TPN at 100 ml/hr at 1800  - TPN will provide 120g protein, 432g CHO, 36g SMOF lipids (~15% of total kcal due to 1/26), and 2308 kcal meeting 100% of estimated needs - Electrolytes in TPN: incr 33 mEq/L of K, 5 mEq/L of Mg, 11 mmol/L of Phos; Na 15 mEq/L (minimal amount required for ingredients), 2 mEq/L of Ca, max acetate  - Add standard MVI and trace elements + folic acid   - Continue moderate SSI to q8h and adjust as needed - Continue FW 200 PT q4h per MD  - Monitor TPN labs - F/u TF restart and tolerability, adjust TPN as needed    Thank you for involving pharmacy in this patient's care.  [employer], PharmD, BCPS Clinical Pharmacist Clinical phone for 12/08/2020 until 3p is [phone] 12/08/2020 7:15 AM  **Pharmacist phone directory can be found on [email or web address] listed under [facility]**

## 2020-12-08 NOTE — Progress Notes (Signed)
NAME:  Ronald Wolf, MRN:  [id number], DOB:  09-26-1946, LOS: 27 ADMISSION DATE:  12/09/2020, CONSULTATION DATE:  11/17/2020 REFERRING MD: Cyndia Skeeters CHIEF COMPLAINT:  hypotension   Brief History   Ronald Wolf is a 74 yo M w/ PMH of CVA, Seizures, Dementia, Bladder ca s/p urostomy, tobacco use, right glottic carcinoma s/p excision presenting with sbo. Found to have ischemic bowel requiring ex-lap 12/7.  Course complicated by septic shock, acute respiratory failure and AKI return to the OR 12/12 for necrotic small bowel resection of additional 70 cm inclusive of prior anastomosis 12/15 abdominal closure with ileostomy  History of present illness   Ronald Wolf is a 74 yo M w/ above PMH who initially presented to [facility] hospital on 11/11/20 with abdominal pain and nausea of 4-5 day duration. He was found to have SBO but left against medical advice as his family did not feel comfortable with him receiving his medical care in [place]. He came to [facility] ED the next day with similar complaint and was found to have SBO. He was treated with supportive care with bowel rest and ng suction. However his symptoms did not improve. Surgery team was consulted and he was brough to OR for exploratory laparoscopy. He was found to have multiple sections of ischemic bowel which were resected. He was unable to be extubated post-operatively and PCCM was consulted.  Past Medical History  CVA, Dementia, Bladder CA s/p nephrostomy, tobacco use, right glottic carcinoma s/p excision, seizures.  Significant Hospital Events   11/16/2020 Admission 11/27/2020 OR 12/01/2020 Admit to ICU 12/10 Brief SVT with associated hypertension. Self limited Given fentanyl, metop and hydral for HTN 12/11 Good urine output with Lasix , failed extubation, reintubated within 2 hours due to inability to clear secretions 12/12 return OR for enteric leak-- found to have bowel necrosis. Left in discontinuity  12/13 self extubated, reintubated. On  pressors. Family meeting with PCCM and CCS to discuss possible next steps.  12/15-was back in OR, abdominal closure, RUQ mucus fistula and L ileostomy  12/19: No significant overnight events, high ileostomy output 12/21: scheduled for family meeting. Worse hypernatremia, continued high ileostomy output.  Attempted PSV/CPAP wean, pulled volumes of high 100s -low/mid 200s with tachypnea. Placed back of full support.  12/22 ongoing high output GI/ ileostomy, TPN started; weaned 12/5 all day; off fentanyl gtt, remains on precedex  12/24 IR drain , 1 unit PRBC transfusion for hemoglobin 6.5 12/26 added vancomycin for GPC in blood cultures and RIC suggesting MRSA Consults:  General Surgery PCCM  Procedures:  11/24/2020 Ex-lap, G-tube placement ETT 12/7 >> 12/11, >> 12/15- back to OR for abdominal closure, exploratory laparotomy  12/24 CT-guided right pelvic abscess drain  Significant Diagnostic Tests:   CT abd/pelvis 12/2 > High-grade distal small bowel obstruction, possibly due to adhesion. Mild ascites and diffuse mesenteric edema. No evidence of focal inflammatory process or abscess. Small bilateral pleural effusions and mild right lower lobe Atelectasis. CT abdomen/pelvis 12/23 Interval development of a large loculated collection in the low anterior abdomen and pelvis measuring approximately 6.1 x 11.3 x12.1 cm in size subjacent to the operative site. Few foci of internal gas are present. , no frank spillage of enteric contrast   Micro Data:  11/13/2020 COVID/Flu negative 12/7 BA: Few GNR rare GPC -- predominantly PMN  12/7 BCx> Neg 12/11 respiratory -no organism in tracheal aspirate 12/24 BC >> RIC showing both staph epidermis 1/4 12/24 abscess >> E. faecalis  Antimicrobials:  Fluc 12/8>> 12/10 Zosyn  12/8> 12/12 cefotetain 12/15>> off Zosyn 12/23 >> eraxis 12/24 >> Vancomycin 12/26 Interim history/subjective:  Remains intubated, sedated.  Objective   Blood pressure 114/62, pulse  87, temperature 97.9 F (36.6 C), temperature source Axillary, resp. rate (!) 22, height 5\' 11"  (1.803 m), weight 61.5 kg, SpO2 100 %.    Vent Mode: PRVC FiO2 (%):  [40 %] 40 % Set Rate:  [14 bmp] 14 bmp Vt Set:  [600 mL] 600 mL PEEP:  [5 cmH20] 5 cmH20 Plateau Pressure:  [20 cmH20-26 cmH20] 21 cmH20   Intake/Output Summary (Last 24 hours) at 12/08/2020 0716 Last data filed at 12/08/2020 0600 Gross per 24 hour  Intake 2447.8 ml  Output 3675 ml  Net -1227.2 ml   Filed Weights   12/05/20 0451 12/06/20 0500 12/07/20 0500  Weight: 59.1 kg 59.3 kg 61.5 kg    Examination: General: cachectic frail elderly man laying in bed in NAD, intubated and sedated HEENT: [place]/AT, eyes anicteric, ETT in place Pulmonary: diminished basilar breath sounds, thick copious tan secretions from ETT. Breathing synchronously with the vent. Cardiac: RRR Abdomen: 2 ostomies with output, drain in RLQ with thick tan drainage. Extremities no cyanosis or edema Neuro RASS -3  CXR personally reviewed > bilateral lower lobe opacities silhouetting hemidiaphragms. ETT at the level of the carina.  Resolved Hospital Problem list   AKI  SVT Assessment & Plan:   Sepsis due to intra-abdominal abscess. GPC in blood likely contaminant rather than bacteremia. Worrisome increase in WBC over past few days. S/p IR drain into intrabdominal abscess -Continue Zosyn, duration to be determined -Continue IV Eraxis -Cont' IV vancomycin -trach aspirate culture  Acute encephalopathy, multifactorial  Plan -avoid deliriogenic meds -PAD protocol with goal RASS 0 to -1   Acute hypoxic respiratory failure requiring mechanical ventilation  Has been intubated x3 during this hospitalization Pleural effusions, improved - Secondary to malnutrition, low albumin -Has failed extubation multiple times, significantly more debilitated than for prior attempts - do not anticipate will be able to be successfully extubated  Plan  -Con't LTVV,  low vent pressures -copious secretions preclude extubation; planning for trach per family's request. -VAP prevention protocol -trach aspirate -adding CPT  SBO Ischemic bowel perforation  -s/p 2 ex laps, resection of ischemic bowel, mucus fistula and ileostomy, G tube placement  Intraabdominal abscess s/p IR drain -Purulent drainage around G tube persists Plan -Continue routine dressing and ostomy care  AKI  Acute metabolic acidosis; likely due to GI bicarb loss & hyperchloremia Serum creatinine holding in the 1.3 range BUN holding in the 50 range Plan -VBG to confirm primary metabolic acidosis; likely will need bicarb supplementation -con't to monitor renal function -strict I/Os -Renally dose medications -Avoid nephrotoxins   HTN AFib w/ RVR Has been in sinus rhythm Plan -Continue telemetry monitoring -Continue low dose metoprolol -Not a candidate for anticoagulation currently  Severe protein calorie malnutrition Plan -Continue TPN per pharmacy -trickle TF   Hx seizure Has had no witnessed seizure Plan -Continue Keppra  History of bladder cancer -Maintain routine urostomy care  Anemia , no obvious source of ongoing blood loss s/p 1 unit PRBC on 12/24 Hemoglobin has drifted down approximately 1 g posttransfusion Plan -con't to monitor -transfuse for Hb <7 or hemodynamically significant bleeding  Goals of Care -poor baseline due to multiple medical problems, post emergent surgery with slow recovery, failed extubations (12/11, 12/13) due to general deconditioning and inability to clear secretions.  -12/13 Family discussion with CCS and PCCM. Family has decided  to pursue all offered aggressive interventions (return OR for abd, trach, full code) -Palliative care consult appreciated for ongoing [place] discussions -Family meeting held again 12/02/20 -- decided they want to proceed with tracheostomy (for more comfort) + ongoing aggressive medical management for now with  ongoing [place] discussions -Guarded prognosis given prior debility and ongoing clinical decline despite aggressive care; Appreciate PMT assistance.  Family to consider changing to DNR over Christmas gatherings.   -need to confirm with family their plans moving forward regarding tracheostomy and appropriate expectations post-operatively  Best practice (evaluated daily)  Diet: TPN, trickle TF Pain/Anxiety/Delirium protocol (if indicated): Fent gtt-> prn, precedex VAP protocol (if indicated): Yes  DVT prophylaxis: SQH  GI prophylaxis: PPI Glucose control: SSI  Mobility: BR  Family updated - please see lengthy documentation 12/21 Family meeting- last 12/21; ongoing  Code Status: Full Disposition: ICU  This patient is critically ill with multiple organ system failure which requires frequent high complexity decision making, assessment, support, evaluation, and titration of therapies. This was completed through the application of advanced monitoring technologies and extensive interpretation of multiple databases. During this encounter critical care time was devoted to patient care services described in this note for 35 minutes.  Julian Hy, DO 12/08/20 7:48 AM [facility]

## 2020-12-08 NOTE — Progress Notes (Signed)
CRITICAL VALUE ALERT  Critical Value: pH 7.021  Date & Time Notied: 12/08/20 0859  Provider Notified: Dr. Chestine Spore  Orders Received/Actions taken: Stated will start bicarb drip

## 2020-12-08 NOTE — Progress Notes (Deleted)
RT attempted SBT with pt. Pt with good volumes on CPAP/PS 10/5 40%. Pt failed SBT due to increased agitation and tachypnea. RT returned pt to full support at this time. RT will continue to monitor and be available as needed.

## 2020-12-08 NOTE — Progress Notes (Signed)
Chief Complaint: Patient was seen today for abscess drain  Supervising Physician: [name]  Patient Status: [facility] - In-pt  Subjective: S/p erc abscess drain 12/24 Remains sedated on vent Family at bedside  Objective: Physical Exam: BP (!) 158/76   Pulse 94   Temp 97.9 F (36.6 C) (Axillary)   Resp (!) 24   Ht 5' 11"  (1.803 m)   Wt 61.5 kg   SpO2 100%   BMI 18.91 kg/m  Drain intact site clean, small amount purulent output   Current Facility-Administered Medications:  .  0.9 %  sodium chloride infusion, 250 mL, Intravenous, Continuous, Meuth, Brooke A, PA-C, Stopped at 11/17/2020 1358 .  acetaminophen (TYLENOL) tablet 650 mg, 650 mg, Per Tube, Q6H PRN, 650 mg at 12/01/20 1017 **OR** acetaminophen (TYLENOL) suppository 650 mg, 650 mg, Rectal, Q4H PRN, Chesley Mires, MD .  [COMPLETED] anidulafungin (ERAXIS) 200 mg in sodium chloride 0.9 % 200 mL IVPB, 200 mg, Intravenous, Once, Stopped at 12/05/20 1618 **FOLLOWED BY** anidulafungin (ERAXIS) 100 mg in sodium chloride 0.9 % 100 mL IVPB, 100 mg, Intravenous, Q24H, Kara Mead V, MD, Last Rate: 78 mL/hr at 12/08/20 0925, 100 mg at 12/08/20 0925 .  chlorhexidine gluconate (MEDLINE KIT) (PERIDEX) 0.12 % solution 15 mL, 15 mL, Mouth Rinse, BID, Meuth, Brooke A, PA-C, 15 mL at 12/08/20 0746 .  Chlorhexidine Gluconate Cloth 2 % PADS 6 each, 6 each, Topical, Daily, Olalere, Adewale A, MD, 6 each at 12/08/20 0400 .  dexmedetomidine (PRECEDEX) 400 MCG/100ML (4 mcg/mL) infusion, 0.4-1.2 mcg/kg/hr, Intravenous, Continuous, Meuth, Brooke A, PA-C, Last Rate: 9 mL/hr at 12/08/20 0800, 0.6 mcg/kg/hr at 12/08/20 0800 .  feeding supplement (OSMOLITE 1.5 CAL) liquid 1,000 mL, 1,000 mL, Per Tube, Continuous, Stark Klein, MD, Last Rate: 40 mL/hr at 12/04/20 1137, Rate Change at 12/04/20 1137 .  feeding supplement (PROSource TF) liquid 45 mL, 45 mL, Per Tube, TID, Georganna Skeans, MD, 45 mL at 12/08/20 0922 .  fentaNYL (SUBLIMAZE) injection 25 mcg,  25 mcg, Intravenous, Q15 min PRN, Jennelle Human B, NP .  fentaNYL (SUBLIMAZE) injection 25-100 mcg, 25-100 mcg, Intravenous, Q30 min PRN, Jennelle Human B, NP, 100 mcg at 12/04/20 1449 .  fentaNYL 2569mg in NS 251m(1075mml) infusion-PREMIX, 0-200 mcg/hr, Intravenous, Continuous, Ogan, Okoronkwo U, MD, Last Rate: 5 mL/hr at 12/08/20 0800, 50 mcg/hr at 12/08/20 0800 .  fiber (NUTRISOURCE FIBER) 1 packet, 1 packet, Per Tube, TID, ByeStark KleinD, 1 packet at 12/08/20 [id number] free water 200 mL, 200 mL, Per Tube, Q4H, Simpson, Paula B, NP, 200 mL at 12/08/20 0746 .  heparin injection 5,000 Units, 5,000 Units, Subcutaneous, Q8H, BlaArdis RowanA-C, 5,000 Units at 12/08/20 [phone] hydrALAZINE (APRESOLINE) injection 5 mg, 5 mg, Intravenous, Q6H PRN, Meuth, Brooke A, PA-C, 5 mg at 12/04/20 2127 .  insulin aspart (novoLOG) injection 0-15 Units, 0-15 Units, Subcutaneous, Q8H, CheDonnamae JudePHNorth [place] Units at 12/08/20 [id number] levETIRAcetam (KEPPRA) 100 MG/ML solution 750 mg, 750 mg, Per Tube, BID, SooChesley MiresD, 750 mg at 12/08/20 [id number] loperamide HCl (IMODIUM) 1 MG/7.5ML suspension 4 mg, 4 mg, Per Tube, Q8H, RamRalene OkD, 4 mg at 12/08/20 0630 .  MEDLINE mouth rinse, 15 mL, Mouth Rinse, 10 times per day, Meuth, Brooke A, PA-C, 15 mL at 12/08/20 0923 .  metoprolol tartrate (LOPRESSOR) tablet 12.5 mg, 12.5 mg, Per Tube, BID, SimJennelle Human NP, 12.5 mg at 12/08/20 0923 .  ondansetron (ZOFRAN) injection 4  mg, 4 mg, Intravenous, Q6H PRN, Meuth, Brooke A, PA-C, 4 mg at 12/02/2020 1129 .  Opium 10 MG/ML (1%) tincture 2 mg, 4 drop, Per Tube, Q6H, Stark Klein, MD, 2 mg at 12/08/20 0946 .  pantoprazole sodium (PROTONIX) 40 mg/20 mL oral suspension 40 mg, 40 mg, Per Tube, QHS, Olalere, Adewale A, MD, 40 mg at 12/07/20 2301 .  piperacillin-tazobactam (ZOSYN) IVPB 3.375 g, 3.375 g, Intravenous, Q8H, von Dohlen, Haley B, RPH, Last Rate: 12.5 mL/hr at 12/08/20 0800, Infusion Verify at 12/08/20  0800 .  sodium bicarbonate 150 mEq in sterile water 1,000 mL infusion, , Intravenous, Continuous, Julian Hy, DO, Last Rate: 100 mL/hr at 12/08/20 0943, New Bag at 12/08/20 0943 .  sodium chloride flush (NS) 0.9 % injection 10-40 mL, 10-40 mL, Intracatheter, Q12H, Meuth, Brooke A, PA-C, 10 mL at 12/08/20 0923 .  sodium chloride flush (NS) 0.9 % injection 10-40 mL, 10-40 mL, Intracatheter, PRN, Meuth, Brooke A, PA-C, 10 mL at 12/04/20 1817 .  sodium chloride flush (NS) 0.9 % injection 5 mL, 5 mL, Intracatheter, Q8H, Arne Cleveland, MD, 5 mL at 12/08/20 9485 .  TPN ADULT (ION), , Intravenous, Continuous TPN, Alvira Philips, [place], Last Rate: 100 mL/hr at 12/08/20 0800, Infusion Verify at 12/08/20 0800 .  TPN ADULT (ION), , Intravenous, Continuous TPN, Alvira Philips, [place] .  vancomycin (VANCOCIN) IVPB 1000 mg/200 mL premix, 1,000 mg, Intravenous, Q24H, Bertis Ruddy, [facility]  Labs: CBC Recent Labs    12/07/20 0533 12/08/20 0428  WBC 21.1* 23.8*  HGB 7.4* 7.1*  HCT 23.1* 23.7*  PLT 252 245   BMET Recent Labs    12/07/20 0533 12/08/20 0428  NA 140 139  K 3.5 3.5  CL 118* 116*  CO2 13* 15*  GLUCOSE 125* 127*  BUN 55* 60*  CREATININE 1.34* 1.48*  CALCIUM 7.7* 7.9*   LFT Recent Labs    12/08/20 0428  PROT 5.4*  ALBUMIN 1.6*  AST 18  ALT 17  ALKPHOS 69  BILITOT 1.6*   PT/INR No results for input(s): LABPROT, INR in the last 72 hours.   Studies/Results: DG Chest Port 1 View  Result Date: 12/08/2020 CLINICAL DATA:  74 year old male with respiratory failure. Status post exploratory laparotomy or earlier this month with resection of ischemic small bowel. EXAM: PORTABLE CHEST 1 VIEW COMPARISON:  Portable chest 12/06/2020 and earlier. FINDINGS: Portable AP semi upright view at 0524 hours. Stable endotracheal tube tip between the clavicles and carina. Stable left PICC line. No enteric tube. Stable lung volumes with veiling lung base opacity greater on the right. Dense  retrocardiac opacity. Upper lungs are clear without pneumothorax or pulmonary edema. Mediastinal contours remain within normal limits. IMPRESSION: 1. Stable lines and tubes. 2. Stable ventilation with bilateral pleural effusions and lower lobe collapse or consolidation. Electronically Signed   By: Genevie Ann M.D.   On: 12/08/2020 07:46    Assessment/Plan: SBO -s/p ex lap with SBR, placement of a gastrostomy tube12/7 Takeback 12/12 by Dr. Zenia Resides for necrotic SB with resection of additional 60-70cm of SB inclusive of prior anastomosis in discontinuity with open abdomen.  - S/P ex lap, ileostomy, mucous fistula, closure 12/15 Abscess seen on CT, s/p perc drain on 12/24 Sedated on vent. IR following drain. Other care/mgmt per PCCM/CCS     LOS: 26 days   I spent a total of 10 minutes in face to face in clinical consultation, greater than 50% of which was counseling/coordinating care for abscess drain  Ascencion Dike PA-C 12/08/2020 11:09 AM

## 2020-12-09 ENCOUNTER — Inpatient Hospital Stay (HOSPITAL_COMMUNITY): Payer: Medicare PPO

## 2020-12-09 DIAGNOSIS — R198 Other specified symptoms and signs involving the digestive system and abdomen: Secondary | ICD-10-CM

## 2020-12-09 DIAGNOSIS — R627 Adult failure to thrive: Secondary | ICD-10-CM | POA: Diagnosis not present

## 2020-12-09 DIAGNOSIS — J9601 Acute respiratory failure with hypoxia: Secondary | ICD-10-CM | POA: Diagnosis not present

## 2020-12-09 DIAGNOSIS — Z9911 Dependence on respirator [ventilator] status: Secondary | ICD-10-CM | POA: Diagnosis not present

## 2020-12-09 DIAGNOSIS — Z932 Ileostomy status: Secondary | ICD-10-CM

## 2020-12-09 DIAGNOSIS — G9340 Encephalopathy, unspecified: Secondary | ICD-10-CM | POA: Diagnosis not present

## 2020-12-09 DIAGNOSIS — Z515 Encounter for palliative care: Secondary | ICD-10-CM | POA: Diagnosis not present

## 2020-12-09 DIAGNOSIS — Z7189 Other specified counseling: Secondary | ICD-10-CM | POA: Diagnosis not present

## 2020-12-09 LAB — BASIC METABOLIC PANEL
Anion gap: 7 (ref 5–15)
BUN: 63 mg/dL — ABNORMAL HIGH (ref 8–23)
CO2: 20 mmol/L — ABNORMAL LOW (ref 22–32)
Calcium: 7.5 mg/dL — ABNORMAL LOW (ref 8.9–10.3)
Chloride: 108 mmol/L (ref 98–111)
Creatinine, Ser: 1.43 mg/dL — ABNORMAL HIGH (ref 0.61–1.24)
GFR, Estimated: 51 mL/min — ABNORMAL LOW (ref >60)
Glucose, Bld: 163 mg/dL — ABNORMAL HIGH (ref 70–99)
Potassium: 3.6 mmol/L (ref 3.5–5.1)
Sodium: 135 mmol/L (ref 135–145)

## 2020-12-09 LAB — GLUCOSE, CAPILLARY
Glucose-Capillary: 160 mg/dL — ABNORMAL HIGH (ref 70–99)
Glucose-Capillary: 126 mg/dL — ABNORMAL HIGH (ref 70–99)
Glucose-Capillary: 130 mg/dL — ABNORMAL HIGH (ref 70–99)
Glucose-Capillary: 144 mg/dL — ABNORMAL HIGH (ref 70–99)

## 2020-12-09 LAB — MAGNESIUM: Magnesium: 2 mg/dL (ref 1.7–2.4)

## 2020-12-09 LAB — PHOSPHORUS: Phosphorus: 2.8 mg/dL (ref 2.5–4.6)

## 2020-12-09 MED ORDER — TRAVASOL 10 % IV SOLN
INTRAVENOUS | Status: AC
  Filled 2020-12-09: qty 1200

## 2020-12-09 MED ORDER — MIDAZOLAM HCL 2 MG/2ML IJ SOLN
INTRAMUSCULAR | Status: AC
  Administered 2020-12-09: 19:00:00 2 mg
  Filled 2020-12-09: qty 4

## 2020-12-09 MED ORDER — CHLORHEXIDINE GLUCONATE 0.12 % MT SOLN
15.0000 mL | Freq: Two times a day (BID) | OROMUCOSAL | Status: DC
  Administered 2020-12-09: 22:00:00 15 mL via OROMUCOSAL
  Filled 2020-12-09: qty 15

## 2020-12-09 MED ORDER — CHLORHEXIDINE GLUCONATE 0.12% ORAL RINSE (MEDLINE KIT)
15.0000 mL | Freq: Two times a day (BID) | OROMUCOSAL | Status: DC
  Administered 2020-12-09 – 2021-01-12 (×69): 15 mL via OROMUCOSAL

## 2020-12-09 MED ORDER — FENTANYL CITRATE (PF) 100 MCG/2ML IJ SOLN
INTRAMUSCULAR | Status: AC
  Administered 2020-12-09: 19:00:00 50 ug
  Filled 2020-12-09: qty 2

## 2020-12-09 MED ORDER — ETOMIDATE 2 MG/ML IV SOLN
INTRAVENOUS | Status: AC
  Administered 2020-12-09: 19:00:00 20 mg
  Filled 2020-12-09: qty 20

## 2020-12-09 MED ORDER — ORAL CARE MOUTH RINSE
15.0000 mL | OROMUCOSAL | Status: DC
  Administered 2020-12-09 – 2021-01-12 (×324): 15 mL via OROMUCOSAL

## 2020-12-09 MED ORDER — SODIUM CHLORIDE 0.9 % IV SOLN
INTRAVENOUS | Status: DC | PRN
  Administered 2020-12-09: 08:00:00 250 mL via INTRAVENOUS
  Administered 2020-12-12 – 2020-12-17 (×2): 500 mL via INTRAVENOUS
  Administered 2021-01-14: 250 mL via INTRAVENOUS
  Administered 2021-01-15: 500 mL via INTRAVENOUS

## 2020-12-09 MED ORDER — ORAL CARE MOUTH RINSE: 15.0000 mL | Freq: Two times a day (BID) | OROMUCOSAL | Status: DC

## 2020-12-09 MED ORDER — OSMOLITE 1.5 CAL PO LIQD: 1000.0000 mL | ORAL | Status: DC

## 2020-12-09 MED ORDER — ROCURONIUM BROMIDE 10 MG/ML (PF) SYRINGE
PREFILLED_SYRINGE | INTRAVENOUS | Status: AC
  Filled 2020-12-09: qty 10

## 2020-12-09 MED ORDER — SODIUM CHLORIDE 3 % IN NEBU
4.0000 mL | INHALATION_SOLUTION | Freq: Three times a day (TID) | RESPIRATORY_TRACT | Status: AC
  Administered 2020-12-09 – 2020-12-11 (×6): 4 mL via RESPIRATORY_TRACT
  Filled 2020-12-09 (×7): qty 4

## 2020-12-09 MED ORDER — POTASSIUM CHLORIDE 10 MEQ/50ML IV SOLN
10.0000 meq | INTRAVENOUS | Status: AC
  Administered 2020-12-09 (×4): 10 meq via INTRAVENOUS
  Filled 2020-12-09 (×4): qty 50

## 2020-12-09 NOTE — Progress Notes (Signed)
PCCM Interval Note  Patient self extubated earlier in day. Now with marginal airway protection, progressive O2 needs.   Today's Vitals   12/09/20 1515 12/09/20 1600 12/09/20 1845 12/09/20 1846  BP: (!) 148/65 (!) 171/70    Pulse: 98 (!) 103 (!) 113 (!) 110  Resp: (!) 35 (!) 37 (!) 36 (!) 41  Temp:  98.8 F (37.1 C)    TempSrc:  Axillary    SpO2:  93% (!) 78% (!) 81%  Weight:      Height:      PainSc:       Body mass index is 17.8 kg/m.; Gen: cachectic man, mild resp distress Neuro: will not answer questions, no gag, does make eye contact, globally weak Lungs: coarse B CV: regular tachy  Abd: PEG in place, pelvic drain with purulent fluid Ext: trace LE edema  Acute on chronic hypoxic resp failure - multifactorial and due to septic shock, toxic metabolic encephalopathy, B LL PNA's. Chronic metabolic acidosis also contributing to WOB.   Based on conversations by Dr Chestine Spore with patient's family, clear that they desire aggressive care and MV, would want trach at least short term. We will plan to re-intubate, ventilate to facilitate trach   Independent CC time 25 minutes  Levy Pupa, MD, PhD 12/09/2020, 7:09 PM [facility] [phone] or if no answer [phone]

## 2020-12-09 NOTE — Plan of Care (Signed)
Updated daughter Ronald Wolf via phone. She indicated she and her siblings wanted to proceed with tracheostomy.  Steffanie Dunn, DO 12/09/20 10:08 AM [facility]

## 2020-12-09 NOTE — Procedures (Signed)
Intubation Procedure Note  Ronald Wolf  [id number]  23-Oct-1946  Date:12/09/20  Time:7:06 PM   Provider Performing:Kavari Parrillo R Jadene Stemmer    Procedure: Intubation (31500)  Indication(s) Respiratory Failure  Consent Risks of the procedure as well as the alternatives and risks of each were explained to the patient and/or caregiver.  Consent for the procedure was obtained and is signed in the bedside chart   Anesthesia Etomidate, Versed and Fentanyl   Time Out Verified patient identification, verified procedure, site/side was marked, verified correct patient position, special equipment/implants available, medications/allergies/relevant history reviewed, required imaging and test results available.   Sterile Technique Usual hand hygeine, masks, and gloves were used   Procedure Description Patient positioned in bed supine.  Sedation given as noted above.  Patient was intubated with endotracheal tube using Glidescope.  View was Grade 1 full glottis .  Number of attempts was 1.  Colorimetric CO2 detector was consistent with tracheal placement.   Complications/Tolerance None; patient tolerated the procedure well. Chest X-ray is ordered to verify placement.   EBL Minimal   Specimen(s) None   Darcella Gasman Fia Hebert, PA-C

## 2020-12-09 NOTE — Progress Notes (Signed)
PHARMACY - TOTAL PARENTERAL NUTRITION CONSULT NOTE  Indication: Small bowel obstruction, prolonged ileus  Patient Measurements: Height: 5\' 11"  (180.3 cm) Weight: 61.2 kg (134 lb 14.7 oz) IBW/kg (Calculated) : 75.3 TPN AdjBW (KG): 60.8 Body mass index is 18.82 kg/m. Usual Weight: 137 lbs  Assessment:  74 YOM presented 12/12/2020 with nausea, vomiting, abdominal pain after leaving AMA at [facility] with known SBO. Patient has been on bowel rest with NG tube decompression. Patient continued to have persistent SBO on imaging with no evidence of improvement, now s/p ex-lap with LoA, SBR and placement of Gtube on 11/16/2020. Pharmacy consulted for TPN management - patient received TPN 11/19/20 through 11/29/20.  Reconsulted to resume TPN on 12/21 despite tube feeds at goal rate due to high ostomy output and concern for tube feeds not absorbing. Patient on loperamide and fiber without improvement in output.   Glucose / Insulin: no hx DM - CBGs <180 on TPN - utilized 8 units SSI/24hrs Electrolytes: K 3.6 (1/22 for today), Na 135, Phos 2.8, Mg 2, Bicarb 20 (on supplement + Max acetate), Cl decreasing On FW q4h Renal: Scr 1.43, BUN 63 LFTs / TGs: LFTs and TG wnl, Tbili 1.6 Prealbumin / albumin: Prealbumin down 7.7 / albumin 1.6 Intake / Output; MIVF: UOP 1.5>2.1 ml/kg/hr, high ileostomy output (on fiber, opium tincture, loperamide and unlikely to improve per Surgery), drain O/P 80 ml, net -8L GI Imaging:  12/11 DG Abd - stomach gas-filled, without distention, no obvious free air 12/23 CT abd pelvis - large loculated collection in low abd/pelvis, few foci of internal gas present, new RUQ ileostomy, thickened distal SB/colon, subtotal atelectatic collapse of LLL, partial collapse of RLL, small R pleural effusion, body wall edema Surgeries / Procedures: 12/12: re-exploration and washout with new copious drainage from incision concerning for enteric leak vs. enterotomy >> now s/p, bowel necrosis  resected 12/15: ex-lap with ileostomy with fistula creation, abdominal closure 12/24 Pelvic abscess drain  Central access: PICC placed 11/16/2020 TPN start date: 11/19/20>>11/29/20; restart 12/03/20 >>  Nutritional Goals (updated per RD rec 12/21): 2300-2700 kCal, 120-135g protein, >2L fluid per day  Goal TPN rate of 100 cc/hr will provide 120g AA, 432g CHO, and 36g lipids (15% of total kCal due to 1/22) for a total of 2308 kcal, meeting 100% of needs  Current Nutrition:  TPN Osmolite 1.5 at 10 ml/hr  ProSource TF 45 ml TID (each 11g AA and 40 kcal)  Plan:  - Continue TPN at 100 ml/hr at 1800  - TPN will provide 120g protein, 432g CHO, 36g SMOF lipids (~15% of total kcal due to [employer]), and 2308 kcal meeting 100% of estimated needs - Electrolytes in TPN: Inc Na to 20 mEq/L, K to 79mEq/L,Phos to 20mEq/L, Continue Max acetate - Add standard MVI and trace elements + folic acid   - Continue moderate SSI to q8h and adjust as needed - Continue FW 200 PT q4h per MD  - BMP, Mg, Phos in AM - F/u TF restart and tolerability, adjust TPN as needed    Thank you for involving pharmacy in this patient's care.  12m, PharmD Clinical Pharmacist Please check AMION for all [facility] Pharmacy numbers 12/09/2020 7:43 AM

## 2020-12-09 NOTE — Progress Notes (Signed)
NAME:  Ronald DunkerCarl L Wolf, MRN:  [id number], DOB:  05/01/1946, LOS: 27 ADMISSION DATE:  11/28/2020, CONSULTATION DATE:  11/13/2020 REFERRING MD: Alanda SlimGonfa CHIEF COMPLAINT:  hypotension   Brief History   Mr.Ronald Wolf is a 74 yo M w/ PMH of CVA, Seizures, Dementia, Bladder ca s/p urostomy, tobacco use, right glottic carcinoma s/p excision presenting with sbo. Found to have ischemic bowel requiring ex-lap 12/7.  Course complicated by septic shock, acute respiratory failure and AKI return to the OR 12/12 for necrotic small bowel resection of additional 70 cm inclusive of prior anastomosis 12/15 abdominal closure with ileostomy  History of present illness   Ronald Wolf is a 74 yo M w/ above PMH who [facility] on [facility]/11/20 with abdominal pain and nausea of 4-5 day duration. He was found to have SBO but left against medical advice as his family did not feel comfortable with him receiving his medical care in [place]. [facility] the next day with similar complaint and was found to have SBO. He was treated with supportive care with bowel rest and ng suction. However his symptoms did not improve. Surgery team was consulted and he was brough to OR for exploratory laparoscopy. He was found to have multiple sections of ischemic bowel which were resected. He was unable to be extubated post-operatively and PCCM was consulted.  Past Medical History  CVA, Dementia, Bladder CA s/p nephrostomy, tobacco use, right glottic carcinoma s/p excision, seizures.  Significant Hospital Events   11/28/2020 Admission 12/01/2020 OR 11/29/2020 Admit to ICU 12/10 Brief SVT with associated hypertension. Self limited Given fentanyl, metop and hydral for HTN 12/11 Good urine output with Lasix , failed extubation, reintubated within 2 hours due to inability to clear secretions 12/12 return OR for enteric leak-- found to have bowel necrosis. Left in discontinuity  12/13 self extubated, reintubated. On  pressors. Family meeting with PCCM and CCS to discuss possible next steps.  12/15-was back in OR, abdominal closure, RUQ mucus fistula and L ileostomy  12/19: No significant overnight events, high ileostomy output 12/21: scheduled for family meeting. Worse hypernatremia, continued high ileostomy output.  Attempted PSV/CPAP wean, pulled volumes of high 100s -low/mid 200s with tachypnea. Placed back of full support.  12/22 ongoing high output GI/ ileostomy, TPN started; weaned 12/5 all day; off fentanyl gtt, remains on precedex  12/24 IR drain , 1 unit PRBC transfusion for hemoglobin 6.5 12/26 added vancomycin for GPC in blood cultures and RIC suggesting MRSA Consults:  General Surgery PCCM  Procedures:  11/26/2020 Ex-lap, G-tube placement ETT 12/7 >> 12/11, >> 12/15- back to OR for abdominal closure, exploratory laparotomy  12/24 CT-guided right pelvic abscess drain  Significant Diagnostic Tests:   CT abd/pelvis 12/2 > High-grade distal small bowel obstruction, possibly due to adhesion. Mild ascites and diffuse mesenteric edema. No evidence of focal inflammatory process or abscess. Small bilateral pleural effusions and mild right lower lobe Atelectasis. CT abdomen/pelvis 12/23 Interval development of a large loculated collection in the low anterior abdomen and pelvis measuring approximately 6.1 x 11.3 x12.1 cm in size subjacent to the operative site. Few foci of internal gas are present. , no frank spillage of enteric contrast   Micro Data:  11/30/2020 COVID/Flu negative 12/7 BA: Few GNR rare GPC -- predominantly PMN  12/7 BCx> Neg 12/11 respiratory -no organism in tracheal aspirate 12/24 BC >> RIC showing both staph epidermis 1/4 12/24 abscess >> E. Faecalis 12/27 trach aspirate>>   Antimicrobials:  Fluc 12/8>> 12/10 Zosyn 12/8> 12/12 cefotetain 12/15>> off Zosyn 12/23 >> eraxis 12/24 >> Vancomycin 12/26 Interim history/subjective:  Febrile overnight. On minimal sedation this  morning.  Objective   Blood pressure (!) 101/52, pulse 67, temperature 97.7 F (36.5 C), temperature source Axillary, resp. rate (!) 23, height 5\' 11"  (1.803 m), weight 65.5 kg, SpO2 100 %.    Vent Mode: PRVC FiO2 (%):  [40 %] 40 % Set Rate:  [14 bmp] 14 bmp Vt Set:  [600 mL] 600 mL PEEP:  [5 cmH20] 5 cmH20 Plateau Pressure:  [19 cmH20-23 cmH20] 19 cmH20   Intake/Output Summary (Last 24 hours) at 12/09/2020 0744 Last data filed at 12/09/2020 0700 Gross per 24 hour  Intake 5440.99 ml  Output 5005 ml  Net 435.99 ml   Filed Weights   12/06/20 0500 12/07/20 0500 12/09/20 0500  Weight: 59.3 kg 61.5 kg 65.5 kg    Examination: General: critically ill appearing cachectic man laying in bed in NAD HEENT: Ronald Wolf, eyes anicteric, oral mucosa moist, ETT in place. Pulmonary: breathing synchronously with MV, mild rhonchi- improved compared to yesterday. Thicker sputum, less than yesterday. Cardiac: RRR, no murmurs Abdomen: urostomy, ileustomy, colostomy, abscess drain. Abdomen mildly distended, soft, NT Extremities no peripheral edema, no cyanosis Neuro RASS -3, not following commands.   Resolved Hospital Problem list   SVT  Assessment & Plan:   Sepsis due to intra-abdominal abscess. GPC in blood likely contaminant rather than bacteremia. Worrisome increase in WBC over past few days. S/p IR drain into intrabdominal abscess -Continue Zosyn, duration to be determined -Continue IV Eraxis -Cont' IV vancomycin -trach aspirate culture pending  Acute encephalopathy, multifactorial  Plan -avoid deliriogenic meds -PAD protocol with goal RASS 0 to -1  Acute hypoxic respiratory failure requiring mechanical ventilation  Has been intubated x3 during this hospitalization Pleural effusions, improved - Secondary to malnutrition, low albumin -Has failed extubation multiple times, significantly more debilitated than for prior attempts - do not anticipate will be able to be successfully  extubated Likely bilateral lower lobe pneumonias  Plan  -Con't LTVV, low vent pressures -Con't daily SAT  &SBT. Family still discussing tracheostomy. -VAP prevention protocol -con't CPT; adding hypertonic saline nebs -con't empiric antibiotics; follow respiratory culture  SBO Ischemic bowel perforation  -s/p 2 ex laps, resection of ischemic bowel, mucus fistula and ileostomy, G tube placement  Intraabdominal abscess s/p IR drain High output ileostomy with bicarb wasting Plan -Continue routine dressing and ostomy care -Discussed his prognosis with his family that his nutritional, hydration, and acid-base issues will be difficult to manage chronically and will likely require long-term nursing home care.  AKI  Acute metabolic acidosis; likely due to GI bicarb loss & hyperchloremia Serum creatinine holding in the 1.3 range BUN holding in the 50 range Plan -con't bicarb supplementation -con't to monitor renal function -strict I/Os -Renally dose medications -Avoid nephrotoxins  HTN AFib w/ RVR Has been in sinus rhythm Plan -Continue telemetry monitoring -Continue low dose metoprolol -Not a candidate for full-dose anticoagulation currently  Severe protein calorie malnutrition Plan -Continue TPN per pharmacy -trickle TF per surgery's recommendations  Hx seizure Has had no witnessed seizure Plan -Continue Keppra  History of bladder cancer -Maintain routine urostomy care  Anemia , no obvious source of ongoing blood loss s/p 1 unit PRBC on 12/24 Hemoglobin has drifted down approximately 1 g posttransfusion Plan -con't to monitor periodically -transfuse for Hb <7 or hemodynamically significant bleeding  Goals of Care -poor baseline due to multiple medical  problems, post emergent surgery with slow recovery, failed extubations (12/11, 12/13) due to general deconditioning and inability to clear secretions.  -12/13 Family discussion with CCS and PCCM. Family has decided to  pursue all offered aggressive interventions (return OR for abd, trach, full code) -Palliative care consult appreciated for ongoing GOC discussions -Family meeting held again 12/02/20 -- decided they want to proceed with tracheostomy (for more comfort) + ongoing aggressive medical management for now with ongoing GOC discussions -Guarded prognosis given prior debility and ongoing clinical decline despite aggressive care; Appreciate PMT assistance.  Family to consider changing to DNR over Christmas gatherings.   -Updated sister Ronald Wolf and daughter Ronald Wolf on 12/27  Best practice (evaluated daily)  Diet: TPN, trickle TF Pain/Anxiety/Delirium protocol (if indicated): Fent gtt-> prn, precedex VAP protocol (if indicated): Yes  DVT prophylaxis: SQH  GI prophylaxis: PPI Glucose control: SSI  Mobility: BR  Family updated - 12/21, 12/27 Family meeting- last 12/27; ongoing  Code Status: Full Disposition: ICU  This patient is critically ill with multiple organ system failure which requires frequent high complexity decision making, assessment, support, evaluation, and titration of therapies. This was completed through the application of advanced monitoring technologies and extensive interpretation of multiple databases. During this encounter critical care time was devoted to patient care services described in this note for 33 minutes.  Steffanie Dunn, DO 12/09/20 8:28 AM  Pulmonary & Critical Care

## 2020-12-09 NOTE — Plan of Care (Signed)
Self-extubated. Awake now, tracking mostly on the left, not answering questions. Moving left arm on command. Tachypneic but no accessory muscle use. Will monitor closely and reintubate if required. Daughter Tiajuana Amass notified by RN and will be coming to bedside.   Steffanie Dunn, DO 12/09/20 3:43 PM [place] [facility]

## 2020-12-09 NOTE — Progress Notes (Signed)
Called by RN and notified that pt has self extubated. Upon arrival to pt room, pt on 4L nasal cannula, SpO2 95%. Pt tachypneic, RR 34. Rhonchi heard bilaterally, pt with weak cough. No stridor heard at this time. RT will continue to monitor and be available as needed.

## 2020-12-09 NOTE — Progress Notes (Signed)
Palliative:  HPI: 74 yo male with past medical history significant of bladder cancer s/p urostomy, seizure disorder, right glottic cancer s/p excisinon, stroke, vascular dementia, atrial fibrillation, tobacco abuse, hep C, anxiety, depression admitted 11/25/2020 with small bowel obstruction and ischemic bowel. Initially seen at [facility] but left AMA and came [facility] following day. S/P exploratory laparoscopy and bowel resection 12/7, further bowel resection 12/12 and found to have bowel necrosis, and abd closure and ileostomy 12/15. Hospitalization complicated by recurrent respiratory failure and intubated x 3.Overall adult failure to thrive.  I met today at [place] bedside. He continues to be somewhat alert but critically ill and failure to thrive. Not able to absorb anything currently and requiring TPN and bicarb infusion. He does also have new stage 2 sacral wound. Discussed with Dr. Carlis Abbott as well as Jerene Pitch, RN.   I called and spoke with [name] regarding above concerns and question if they continue to desire tracheostomy given his continued poor prognosis. Derrick Ravel was very clear that family still desire tracheostomy and vent support. We did discuss their decision for code status and they would NOT want resuscitation if his heart stops and would allow for natural death. Made partial code with plans to continue ventilator and tracheostomy placement. Wish to continue current care at this time and would desire reintubation if needed. She plans to come to bedside this afternoon.   All questions/concerns addressed. Emotional support provided.   Exam: Alert and nods head. Thin, frail, cachectic. ETT to vent. No distress. Abd dressing and ostomy bags in place and clean, dry.   Plan:  - Continue to pursue aggressive care.  - Plans for tracheostomy placement.  - Partial code: Intubation only.   25 min  Vinie Sill, NP Palliative Medicine Team Pager [phone] (Please see [email or web address] for  schedule) Team Phone [phone]    Greater than 50%  of this time was spent counseling and coordinating care related to the above assessment and plan

## 2020-12-09 NOTE — Progress Notes (Signed)
[facility] Progress Note  13 Days Post-Op  Subjective: CC-  Patient self-extubated earlier today. Currently O2 sats mid-90's on 4L [place], tachypneic.   Objective: Vital signs in last 24 hours: Temp:  [97.7 F (36.5 C)-100.5 F (38.1 C)] 98.3 F (36.8 C) (12/28 1200) Pulse Rate:  [63-103] 103 (12/28 1600) Resp:  [17-37] 37 (12/28 1600) BP: (91-171)/(47-70) 171/70 (12/28 1600) SpO2:  [93 %-100 %] 93 % (12/28 1600) FiO2 (%):  [40 %] 40 % (12/28 1126) Weight:  [57.9 kg-65.5 kg] 57.9 kg (12/28 1427) Last BM Date: 12/09/20  Intake/Output from previous day: 12/27 0701 - 12/28 0700 In: 5441 [I.V.:4764.9; NG/GT:190; IV Piggyback:486.1] Out: 5005 [Urine:3225; Drains:80; Stool:1700] Intake/Output this shift: Total I/O In: 3835.3 [I.V.:1842.8; NG/GT:1300; IV Piggyback:692.5] Out: 2100 [Urine:1575; Stool:525]  PE: Gen:  Alert, mild tachypnea but NAD Card:  RRR Pulm:  Tachypnea, O2 sats mid-90's on 4L [place], mild rhonchi bilaterally, no wheezing Abd: soft, NT, open midline wound with fibrinous exudate and some tissue necrosis but fascia intact/ sutures visible, RUQ ostomy (mucus fistula) dusky and sloughing with scant SS output, LLQ ostomy (end ileostomy) pink and profuse output, urostomy productive, g-tube site c/d with TF running at 10cc/hr, IR drain with purulent fluid in bag Neuro: does not follow commands consistently but did wiggle toes once on command Skin: warm and dry  Lab Results:  Recent Labs    12/07/20 0533 12/08/20 0428  WBC 21.1* 23.8*  HGB 7.4* 7.1*  HCT 23.1* 23.7*  PLT 252 245   BMET Recent Labs    12/08/20 1837 12/09/20 0415  NA 137 135  K 3.8 3.6  CL 112* 108  CO2 16* 20*  GLUCOSE 130* 163*  BUN 59* 63*  CREATININE 1.37* 1.43*  CALCIUM 7.6* 7.5*   PT/INR No results for input(s): LABPROT, INR in the last 72 hours. CMP     Component Value Date/Time   NA 135 12/09/2020 0415   NA 145 03/08/2016 1356   K 3.6 12/09/2020 0415   K 4.5  03/08/2016 1356   CL 108 12/09/2020 0415   CO2 20 (L) 12/09/2020 0415   CO2 28 03/08/2016 1356   GLUCOSE 163 (H) 12/09/2020 0415   GLUCOSE 98 03/08/2016 1356   BUN 63 (H) 12/09/2020 0415   BUN 14.2 03/08/2016 1356   CREATININE 1.43 (H) 12/09/2020 0415   CREATININE 0.9 03/08/2016 1356   CALCIUM 7.5 (L) 12/09/2020 0415   CALCIUM 9.8 03/08/2016 1356   PROT 5.4 (L) 12/08/2020 0428   PROT 8.2 03/08/2016 1356   ALBUMIN 1.6 (L) 12/08/2020 0428   ALBUMIN 3.6 03/08/2016 1356   AST 18 12/08/2020 0428   AST 42 (H) 03/08/2016 1356   ALT 17 12/08/2020 0428   ALT 43 03/08/2016 1356   ALKPHOS 69 12/08/2020 0428   ALKPHOS 79 03/08/2016 1356   BILITOT 1.6 (H) 12/08/2020 0428   BILITOT 0.74 03/08/2016 1356   GFRNONAA 51 (L) 12/09/2020 0415   GFRAA >60 12/31/2019 0221   Lipase     Component Value Date/Time   LIPASE 57 (H) 11/28/2020 1830       Studies/Results: DG Chest Port 1 View  Result Date: 12/08/2020 CLINICAL DATA:  74 year old male with respiratory failure. Status post exploratory laparotomy or earlier this month with resection of ischemic small bowel. EXAM: PORTABLE CHEST 1 VIEW COMPARISON:  Portable chest 12/06/2020 and earlier. FINDINGS: Portable AP semi upright view at 0524 hours. Stable endotracheal tube tip between the clavicles and carina. Stable left PICC line.  No enteric tube. Stable lung volumes with veiling lung base opacity greater on the right. Dense retrocardiac opacity. Upper lungs are clear without pneumothorax or pulmonary edema. Mediastinal contours remain within normal limits. IMPRESSION: 1. Stable lines and tubes. 2. Stable ventilation with bilateral pleural effusions and lower lobe collapse or consolidation. Electronically Signed   By: Odessa Fleming M.D.   On: 12/08/2020 07:46    Anti-infectives: Anti-infectives (From admission, onward)   Start     Dose/Rate Route Frequency Ordered Stop   12/08/20 1300  vancomycin (VANCOCIN) IVPB 1000 mg/200 mL premix        1,000  mg 200 mL/hr over 60 Minutes Intravenous Every 24 hours 12/08/20 0845     12/08/20 0100  vancomycin (VANCOREADY) IVPB 500 mg/100 mL  Status:  Discontinued        500 mg 100 mL/hr over 60 Minutes Intravenous Every 12 hours 12/07/20 1130 12/07/20 1337   12/07/20 1215  vancomycin (VANCOREADY) IVPB 1250 mg/250 mL        1,250 mg 166.7 mL/hr over 90 Minutes Intravenous  Once 12/07/20 1124 12/07/20 1336   12/06/20 1030  anidulafungin (ERAXIS) 100 mg in sodium chloride 0.9 % 100 mL IVPB       "Followed by" Linked Group Details   100 mg 78 mL/hr over 100 Minutes Intravenous Every 24 hours 12/05/20 0939     12/05/20 1030  anidulafungin (ERAXIS) 200 mg in sodium chloride 0.9 % 200 mL IVPB       "Followed by" Linked Group Details   200 mg 78 mL/hr over 200 Minutes Intravenous  Once 12/05/20 0939 12/05/20 1618   12/04/20 1500  piperacillin-tazobactam (ZOSYN) IVPB 3.375 g        3.375 g 12.5 mL/hr over 240 Minutes Intravenous Every 8 hours 12/04/20 0818     12/04/20 0915  piperacillin-tazobactam (ZOSYN) IVPB 3.375 g        3.375 g 100 mL/hr over 30 Minutes Intravenous  Once 12/04/20 0818 12/04/20 1207   11/30/2020 0945  cefoTEtan (CEFOTAN) 2 g in sodium chloride 0.9 % 100 mL IVPB  Status:  Discontinued        2 g 200 mL/hr over 30 Minutes Intravenous To Surgery 12/12/2020 0936 12/10/2020 1057   12/02/2020 0900  cefoTEtan (CEFOTAN) 2 g in sodium chloride 0.9 % 100 mL IVPB  Status:  Discontinued        2 g 200 mL/hr over 30 Minutes Intravenous  Once 12/01/2020 0833 11/16/2020 0936   11/19/20 1130  piperacillin-tazobactam (ZOSYN) IVPB 3.375 g        3.375 g 12.5 mL/hr over 240 Minutes Intravenous Every 8 hours 11/19/20 1017 12-14-20 2359   11/19/20 1115  fluconazole (DIFLUCAN) IVPB 200 mg        200 mg 100 mL/hr over 60 Minutes Intravenous Every 24 hours 11/19/20 1017 11/21/20 1104   11/19/20 0915  Ampicillin-Sulbactam (UNASYN) 3 g in sodium chloride 0.9 % 100 mL IVPB  Status:  Discontinued        3 g 200  mL/hr over 30 Minutes Intravenous Every 12 hours 11/19/20 0826 11/19/20 0949       Assessment/Plan SBO -s/p ex lap with SBR, placement of a gastrostomy tube12/7 by Dr. Corliss Skains. Takeback 12/12 by Dr. Freida Busman for necrotic SB with resection of additional 60-70cm of SB inclusive of prior anastomosis in discontinuity with open abdomen.  - S/P ex lap, ileostomy, mucous fistula, closure 12/15 by Dr. Janee Morn Malnourishment/FEN- cont TNA. Continue TF at trickle. Monitor  I/O- high output ostomy. On fiber and imodium VDRF- self-extubated 12/13 AM, re-intubated ~3h after. Self-extubated again today, currently on 4L via [place]. Per CCM Shock/ID- shockresolved, off pressors;necrotic bowel was most likely source. WBC highremains high. On Zosyn, vancomycin, eraxis. Drain as below. May also have a pneumonia Intra-abdominal abscess - seen on CT 12/23,s/p perc drain by IR 12/24. Culture with Enterococcus faecalis and Bacteroides fragilis, beta lactamase positive.  CV/AF RVR- in NSRcurrently VTE-SQH Dispo - ICU. Possible impending re-intubation per CCM. Palliative care and CCM working with family to discuss code status/goals of care, currently plan to pursue aggressive care, partial code/ intubation only, ok for trach.    LOS: 27 days    [facility] 12/09/2020, 4:26 PM Please see Amion for pager number during day hours 7:00am-4:30pm

## 2020-12-10 ENCOUNTER — Inpatient Hospital Stay (HOSPITAL_COMMUNITY): Payer: Medicare PPO

## 2020-12-10 DIAGNOSIS — R5381 Other malaise: Secondary | ICD-10-CM

## 2020-12-10 DIAGNOSIS — E872 Acidosis: Secondary | ICD-10-CM | POA: Diagnosis not present

## 2020-12-10 DIAGNOSIS — J181 Lobar pneumonia, unspecified organism: Secondary | ICD-10-CM | POA: Diagnosis not present

## 2020-12-10 DIAGNOSIS — Z9911 Dependence on respirator [ventilator] status: Secondary | ICD-10-CM | POA: Diagnosis not present

## 2020-12-10 DIAGNOSIS — J9601 Acute respiratory failure with hypoxia: Secondary | ICD-10-CM | POA: Diagnosis not present

## 2020-12-10 LAB — GLUCOSE, CAPILLARY
Glucose-Capillary: 139 mg/dL — ABNORMAL HIGH (ref 70–99)
Glucose-Capillary: 154 mg/dL — ABNORMAL HIGH (ref 70–99)
Glucose-Capillary: 161 mg/dL — ABNORMAL HIGH (ref 70–99)

## 2020-12-10 LAB — CULTURE, BLOOD (ROUTINE X 2)
Culture: NO GROWTH
Special Requests: ADEQUATE

## 2020-12-10 LAB — MAGNESIUM: Magnesium: 2 mg/dL (ref 1.7–2.4)

## 2020-12-10 LAB — CBC
HCT: 22.9 % — ABNORMAL LOW (ref 39.0–52.0)
Hemoglobin: 7.1 g/dL — ABNORMAL LOW (ref 13.0–17.0)
MCH: 29.6 pg (ref 26.0–34.0)
MCHC: 31 g/dL (ref 30.0–36.0)
MCV: 95.4 fL (ref 80.0–100.0)
Platelets: 327 10*3/uL (ref 150–400)
RBC: 2.4 MIL/uL — ABNORMAL LOW (ref 4.22–5.81)
RDW: 18.6 % — ABNORMAL HIGH (ref 11.5–15.5)
WBC: 25.4 10*3/uL — ABNORMAL HIGH (ref 4.0–10.5)
nRBC: 0 % (ref 0.0–0.2)

## 2020-12-10 LAB — BASIC METABOLIC PANEL
Anion gap: 10 (ref 5–15)
BUN: 53 mg/dL — ABNORMAL HIGH (ref 8–23)
CO2: 26 mmol/L (ref 22–32)
Calcium: 7.4 mg/dL — ABNORMAL LOW (ref 8.9–10.3)
Chloride: 103 mmol/L (ref 98–111)
Creatinine, Ser: 1.15 mg/dL (ref 0.61–1.24)
GFR, Estimated: >60 mL/min (ref >60)
Glucose, Bld: 159 mg/dL — ABNORMAL HIGH (ref 70–99)
Potassium: 4 mmol/L (ref 3.5–5.1)
Sodium: 139 mmol/L (ref 135–145)

## 2020-12-10 LAB — PHOSPHORUS: Phosphorus: 2.9 mg/dL (ref 2.5–4.6)

## 2020-12-10 MED ORDER — FENTANYL CITRATE (PF) 100 MCG/2ML IJ SOLN
200.0000 ug | Freq: Once | INTRAMUSCULAR | Status: AC
Start: 2020-12-10 — End: 2020-12-10

## 2020-12-10 MED ORDER — ETOMIDATE 2 MG/ML IV SOLN: 40.0000 mg | Freq: Once | INTRAVENOUS | Status: AC

## 2020-12-10 MED ORDER — MIDAZOLAM HCL 2 MG/2ML IJ SOLN
INTRAMUSCULAR | Status: AC
  Administered 2020-12-10: 15:00:00 2 mg via INTRAVENOUS
  Filled 2020-12-10: qty 4

## 2020-12-10 MED ORDER — MIDAZOLAM HCL 2 MG/2ML IJ SOLN: 5.0000 mg | Freq: Once | INTRAMUSCULAR | Status: AC

## 2020-12-10 MED ORDER — ROCURONIUM BROMIDE 50 MG/5ML IV SOSY
70.0000 mg | PREFILLED_SYRINGE | Freq: Once | INTRAVENOUS | Status: AC
  Filled 2020-12-10: qty 10

## 2020-12-10 MED ORDER — TRAVASOL 10 % IV SOLN
INTRAVENOUS | Status: AC
  Filled 2020-12-10: qty 1200

## 2020-12-10 MED ORDER — ETOMIDATE 2 MG/ML IV SOLN
INTRAVENOUS | Status: AC
  Administered 2020-12-10: 15:00:00 20 mg via INTRAVENOUS
  Filled 2020-12-10: qty 20

## 2020-12-10 MED ORDER — FENTANYL CITRATE (PF) 100 MCG/2ML IJ SOLN
INTRAMUSCULAR | Status: AC
  Administered 2020-12-10: 15:00:00 100 ug via INTRAVENOUS
  Filled 2020-12-10: qty 2

## 2020-12-10 MED ORDER — OSMOLITE 1.5 CAL PO LIQD
1000.0000 mL | ORAL | Status: DC
  Administered 2020-12-10: 09:00:00 1000 mL

## 2020-12-10 MED ORDER — ROCURONIUM BROMIDE 10 MG/ML (PF) SYRINGE
PREFILLED_SYRINGE | INTRAVENOUS | Status: AC
  Administered 2020-12-10: 15:00:00 70 mg via INTRAVENOUS
  Filled 2020-12-10: qty 10

## 2020-12-10 MED ORDER — VECURONIUM BROMIDE 10 MG IV SOLR: 10.0000 mg | Freq: Once | INTRAVENOUS | Status: DC

## 2020-12-10 NOTE — Progress Notes (Signed)
Supervising Physician: Irish LackYamagata, Glenn  [facility]  [facility]  Chief Complaint: Intra-abdominal fluid collection  Subjective: Remains intubated.  Fiance at bedside.  Plans for possible trach this afternoon.  Drain in place with purulent output.   Allergies: Iohexol  Medications: Prior to Admission medications   Medication Sig Start Date End Date Taking? Authorizing Provider  amLODipine (NORVASC) 10 MG tablet Take 1 tablet (10 mg total) by mouth daily. 02/11/20  Yes Bradd CanaryBlyth, Stacey A, MD  atorvastatin (LIPITOR) 80 MG tablet TAKE 1 TABLET(80 MG) BY MOUTH DAILY AT 6 PM 02/11/20  Yes Bradd CanaryBlyth, Stacey A, MD  carvedilol (COREG) 3.125 MG tablet Take 1 tablet (3.125 mg total) by mouth 2 (two) times daily with a meal. 02/11/20  Yes Bradd CanaryBlyth, Stacey A, MD  clopidogrel (PLAVIX) 75 MG tablet TAKE 1 TABLET(75 MG) BY MOUTH DAILY Patient taking differently: Take 75 mg by mouth daily. TAKE 1 TABLET(75 MG) BY MOUTH DAILY 08/21/20  Yes Bradd CanaryBlyth, Stacey A, MD  folic acid (FOLVITE) 1 MG tablet Take 1 tablet (1 mg total) by mouth daily. 02/11/20  Yes Bradd CanaryBlyth, Stacey A, MD  levETIRAcetam (KEPPRA) 750 MG tablet TAKE 1 TABLET(750 MG) BY MOUTH TWICE DAILY Patient taking differently: Take 750 mg by mouth 2 (two) times daily.  06/30/20  Yes Bradd CanaryBlyth, Stacey A, MD  mirtazapine (REMERON) 15 MG tablet Take 15 mg by mouth at bedtime. 11/04/20  Yes [provider]  tetrahydrozoline-zinc (VISINE-AC) 0.05-0.25 % ophthalmic solution Place 1 drop into both eyes daily as needed (dry eyes).    Yes [provider]     Vital Signs: BP (!) 99/53   Pulse 65   Temp 97.6 F (36.4 C) (Axillary)   Resp 14   Ht 5\' 11"  (1.803 m)   Wt 127 lb 10.3 oz (57.9 kg)   SpO2 100%   BMI 17.80 kg/m   Physical Exam  NAD, alert Abdomen: Large midline wound. Drain in place. Insertion site intact.   Bloody, purulent output. Flushes easily.   Imaging: DG CHEST PORT 1 VIEW  Result Date: 12/09/2020 CLINICAL DATA:   Intubated, respiratory failure EXAM: PORTABLE CHEST 1 VIEW COMPARISON:  12/08/2020 FINDINGS: 2 frontal views of the chest demonstrate endotracheal tube overlying tracheal air column tip midway between thoracic inlet and carina. Left-sided central venous catheter tip overlies superior vena cava. Persistent bibasilar veiling opacities unchanged. No pneumothorax. Cardiac silhouette is stable. IMPRESSION: 1. Stable support devices. 2. Persistent bibasilar consolidation and effusions.  No change. Electronically Signed   By: Sharlet SalinaMichael  Brown M.D.   On: 12/09/2020 19:21   DG Chest Port 1 View  Result Date: 12/08/2020 CLINICAL DATA:  74 year old male with respiratory failure. Status post exploratory laparotomy or earlier this month with resection of ischemic small bowel. EXAM: PORTABLE CHEST 1 VIEW COMPARISON:  Portable chest 12/06/2020 and earlier. FINDINGS: Portable AP semi upright view at 0524 hours. Stable endotracheal tube tip between the clavicles and carina. Stable left PICC line. No enteric tube. Stable lung volumes with veiling lung base opacity greater on the right. Dense retrocardiac opacity. Upper lungs are clear without pneumothorax or pulmonary edema. Mediastinal contours remain within normal limits. IMPRESSION: 1. Stable lines and tubes. 2. Stable ventilation with bilateral pleural effusions and lower lobe collapse or consolidation. Electronically Signed   By: Odessa FlemingH  Hall M.D.   On: 12/08/2020 07:46    Labs:  CBC: Recent Labs    12/06/20 0103 12/07/20 0533 12/08/20 0428 12/10/20 0500  WBC 18.4* 21.1*  23.8* 25.4*  HGB 8.5* 7.4* 7.1* 7.1*  HCT 26.5* 23.1* 23.7* 22.9*  PLT 269 252 245 327    COAGS: Recent Labs    12/16/19 1515  INR 1.0  APTT 24    BMP: Recent Labs    12/20/19 0546 12/21/19 0439 12/27/19 0548 12/31/19 0221 12/10/2020 1830 12/08/20 0428 12/08/20 1837 12/09/20 0415 12/10/20 0500  NA 146* 140 142 138   < > 139 137 135 139  K 2.8* 3.8 4.3 4.4   < > 3.5 3.8 3.6 4.0   CL 115* 110 108 107   < > 116* 112* 108 103  CO2 22 19* 25 21*   < > 15* 16* 20* 26  GLUCOSE 118* 98 98 113*   < > 127* 130* 163* 159*  BUN 19 15 22  34*   < > 60* 59* 63* 53*  CALCIUM 8.5* 8.5* 9.0 9.0   < > 7.9* 7.6* 7.5* 7.4*  CREATININE 1.14 0.87 1.02 1.21   < > 1.48* 1.37* 1.43* 1.15  GFRNONAA >60 >60 >60 59*   < > 49* 54* 51* >60  GFRAA >60 >60 >60 >60  --   --   --   --   --    < > = values in this interval not displayed.    LIVER FUNCTION TESTS: Recent Labs    12/04/20 0510 12/05/20 0607 12/06/20 0103 12/08/20 0428  BILITOT 1.1 0.9 1.3* 1.6*  AST 26 20 17 18   ALT 27 20 15 17   ALKPHOS 131* 85 67 69  PROT 5.3* 5.5* 5.6* 5.4*  ALBUMIN 1.3* 2.2* 2.3* 1.6*    Assessment and Plan: SBO s/p ex lap with SBR, placement of a gastrostomy tube12/7 Takeback 12/12 by Dr. for necrotic SB with resection of additional 60-70cm of SB inclusive of prior anastomosis in discontinuity with open abdomen.  S/P ex lap, ileostomy, mucous fistula, closure 12/15 Abscess by CT 12/23 s/p perc drain on 12/24 by Dr. 1/16 Patient remains intubated.  Awake.  Plans for possible trach this afternoon.  Drain in place without issues. Flushes easily.   Bloody, purulent output in gravity bag.  Cx showed enterococcus faecalis, abundant bacteroides. WBC increasing, now 25.4. Continue current management.  Electronically Signed: 1/24, PA 12/10/2020, 2:58 PM   I spent a total of 15 Minutes at the the patient's bedside AND on the patient's hospital floor or unit, greater than 50% of which was counseling/coordinating care for intra-abdominal fluid collection.

## 2020-12-10 NOTE — Progress Notes (Signed)
[facility] Progress Note  14 Days Post-Op  Subjective: CC- No issues overnight.   Objective: Vital signs in last 24 hours: Temp:  [97.6 F (36.4 C)-99.7 F (37.6 C)] 97.6 F (36.4 C) (12/29 0846) Pulse Rate:  [68-113] 79 (12/29 0800) Resp:  [20-41] 29 (12/29 0800) BP: (103-171)/(47-74) 125/61 (12/29 0800) SpO2:  [78 %-100 %] 100 % (12/29 0800) FiO2 (%):  [40 %] 40 % (12/29 0800) Weight:  [57.9 kg] 57.9 kg (12/28 1427) Last BM Date: 12/10/20  Intake/Output from previous day: 12/28 0701 - 12/29 0700 In: [id number] [I.V.:4997.8; NG/GT:1920; IV Piggyback:775.4] Out: 5390 [Urine:3650; Drains:80; Stool:1660] Intake/Output this shift: Total I/O In: 215.7 [I.V.:193.2; NG/GT:10; IV Piggyback:12.5] Out: 350 [Urine:350]  PE: Gen:  Alert, no distress Card:  RRR Pulm:  On vent Abd: soft, NT, open midline wound with fibrinous exudate and some tissue necrosis but fascia intact/ sutures visible, RUQ ostomy (mucus fistula) dusky and sloughing with scant SS output, LLQ ostomy (end ileostomy) pink and good output (1250 recorded yesterday), urostomy productive, g-tube site c/d with TF running at 10cc/hr, IR drain with purulent fluid in bag Neuro: does not follow commands today Skin: warm and dry  Lab Results:  Recent Labs    12/08/20 0428 12/10/20 0500  WBC 23.8* 25.4*  HGB 7.1* 7.1*  HCT 23.7* 22.9*  PLT 245 327   BMET Recent Labs    12/09/20 0415 12/10/20 0500  NA 135 139  K 3.6 4.0  CL 108 103  CO2 20* 26  GLUCOSE 163* 159*  BUN 63* 53*  CREATININE 1.43* 1.15  CALCIUM 7.5* 7.4*   PT/INR No results for input(s): LABPROT, INR in the last 72 hours. CMP     Component Value Date/Time   NA 139 12/10/2020 0500   NA 145 03/08/2016 1356   K 4.0 12/10/2020 0500   K 4.5 03/08/2016 1356   CL 103 12/10/2020 0500   CO2 26 12/10/2020 0500   CO2 28 03/08/2016 1356   GLUCOSE 159 (H) 12/10/2020 0500   GLUCOSE 98 03/08/2016 1356   BUN 53 (H) 12/10/2020 0500   BUN  14.2 03/08/2016 1356   CREATININE 1.15 12/10/2020 0500   CREATININE 0.9 03/08/2016 1356   CALCIUM 7.4 (L) 12/10/2020 0500   CALCIUM 9.8 03/08/2016 1356   PROT 5.4 (L) 12/08/2020 0428   PROT 8.2 03/08/2016 1356   ALBUMIN 1.6 (L) 12/08/2020 0428   ALBUMIN 3.6 03/08/2016 1356   AST 18 12/08/2020 0428   AST 42 (H) 03/08/2016 1356   ALT 17 12/08/2020 0428   ALT 43 03/08/2016 1356   ALKPHOS 69 12/08/2020 0428   ALKPHOS 79 03/08/2016 1356   BILITOT 1.6 (H) 12/08/2020 0428   BILITOT 0.74 03/08/2016 1356   GFRNONAA >60 12/10/2020 0500   GFRAA >60 12/31/2019 0221   Lipase     Component Value Date/Time   LIPASE 57 (H) 11/26/2020 1830       Studies/Results: DG CHEST PORT 1 VIEW  Result Date: 12/09/2020 CLINICAL DATA:  Intubated, respiratory failure EXAM: PORTABLE CHEST 1 VIEW COMPARISON:  12/08/2020 FINDINGS: 2 frontal views of the chest demonstrate endotracheal tube overlying tracheal air column tip midway between thoracic inlet and carina. Left-sided central venous catheter tip overlies superior vena cava. Persistent bibasilar veiling opacities unchanged. No pneumothorax. Cardiac silhouette is stable. IMPRESSION: 1. Stable support devices. 2. Persistent bibasilar consolidation and effusions.  No change. Electronically Signed   By: Randa Ngo M.D.   On: 12/09/2020 19:21    Anti-infectives:  Anti-infectives (From admission, onward)   Start     Dose/Rate Route Frequency Ordered Stop   12/08/20 1300  vancomycin (VANCOCIN) IVPB 1000 mg/200 mL premix        1,000 mg 200 mL/hr over 60 Minutes Intravenous Every 24 hours 12/08/20 0845     12/08/20 0100  vancomycin (VANCOREADY) IVPB 500 mg/100 mL  Status:  Discontinued        500 mg 100 mL/hr over 60 Minutes Intravenous Every 12 hours 12/07/20 1130 12/07/20 1337   12/07/20 1215  vancomycin (VANCOREADY) IVPB 1250 mg/250 mL        1,250 mg 166.7 mL/hr over 90 Minutes Intravenous  Once 12/07/20 1124 12/07/20 1336   12/06/20 1030   anidulafungin (ERAXIS) 100 mg in sodium chloride 0.9 % 100 mL IVPB       "Followed by" Linked Group Details   100 mg 78 mL/hr over 100 Minutes Intravenous Every 24 hours 12/05/20 0939     12/05/20 1030  anidulafungin (ERAXIS) 200 mg in sodium chloride 0.9 % 200 mL IVPB       "Followed by" Linked Group Details   200 mg 78 mL/hr over 200 Minutes Intravenous  Once 12/05/20 0939 12/05/20 1618   12/04/20 1500  piperacillin-tazobactam (ZOSYN) IVPB 3.375 g        3.375 g 12.5 mL/hr over 240 Minutes Intravenous Every 8 hours 12/04/20 0818     12/04/20 0915  piperacillin-tazobactam (ZOSYN) IVPB 3.375 g        3.375 g 100 mL/hr over 30 Minutes Intravenous  Once 12/04/20 0818 12/04/20 1207   12/02/2020 0945  cefoTEtan (CEFOTAN) 2 g in sodium chloride 0.9 % 100 mL IVPB  Status:  Discontinued        2 g 200 mL/hr over 30 Minutes Intravenous To Surgery 11/21/2020 0936 11/23/2020 1057   11/20/2020 0900  cefoTEtan (CEFOTAN) 2 g in sodium chloride 0.9 % 100 mL IVPB  Status:  Discontinued        2 g 200 mL/hr over 30 Minutes Intravenous  Once 12/09/2020 0833 12/02/2020 0936   11/19/20 1130  piperacillin-tazobactam (ZOSYN) IVPB 3.375 g        3.375 g 12.5 mL/hr over 240 Minutes Intravenous Every 8 hours 11/19/20 1017 11/18/2020 2359   11/19/20 1115  fluconazole (DIFLUCAN) IVPB 200 mg        200 mg 100 mL/hr over 60 Minutes Intravenous Every 24 hours 11/19/20 1017 11/21/20 1104   11/19/20 0915  Ampicillin-Sulbactam (UNASYN) 3 g in sodium chloride 0.9 % 100 mL IVPB  Status:  Discontinued        3 g 200 mL/hr over 30 Minutes Intravenous Every 12 hours 11/19/20 0826 11/19/20 0949       Assessment/Plan SBO -s/p ex lap with SBR, placement of a gastrostomy tube12/7 by Dr. Georgette Dover. Takeback 12/12 by Dr. Zenia Resides for necrotic SB with resection of additional 60-70cm of SB inclusive of prior anastomosis in discontinuity with open abdomen.  - S/P ex lap, ileostomy, mucous fistula, closure 12/15 by Dr.  Grandville Silos Malnourishment/FEN- cont TNA. Continue TF at trickle. Monitor I/O- high output ostomy. On fiber and imodium. Try increasing TF today. VDRF- self-extubated 12/13 AM and 12/28, re-intubated.. Per CCM Shock/ID- shockresolved, off pressors;necrotic bowel was most likely source. WBC highremains high. On Zosyn, vancomycin, eraxis. Drain as below. May also have a pneumonia Intra-abdominal abscess - seen on CT 12/23,s/p perc drain by IR 12/24. Culture with Enterococcus faecalis and Bacteroides fragilis, beta lactamase positive.  CV/AF RVR-  in NSRcurrently VTE-SQH Dispo - ICU. Palliative care and CCM working with family to discuss code status/goals of care, currently plan to pursue aggressive care, partial code/ intubation only, ok for trach.    LOS: 28 days    Berna Bue  MD [facility] 12/10/2020, 8:48 AM Please see Amion for pager number during day hours 7:00am-4:30pm

## 2020-12-10 NOTE — Progress Notes (Addendum)
NAME:  Ronald Wolf, MRN:  [id number], DOB:  25-Nov-1946, LOS: 42 ADMISSION DATE:  12/05/2020, CONSULTATION DATE:  11/12/2020 REFERRING MD: Ronald Wolf CHIEF COMPLAINT:  hypotension   Brief History   Ronald Wolf is a 74 yo M w/ PMH of CVA, Seizures, Dementia, Bladder ca s/p urostomy, tobacco use, right glottic carcinoma s/p excision presenting with sbo. Found to have ischemic bowel requiring ex-lap 12/7.  Course complicated by septic shock, acute respiratory failure and AKI return to the OR 12/12 for necrotic small bowel resection of additional 70 cm inclusive of prior anastomosis 12/15 abdominal closure with ileostomy  History of present illness   Ronald Wolf is a 74 yo M w/ above PMH who initially presented to [facility] hospital on 11/11/20 with abdominal pain and nausea of 4-5 day duration. He was found to have SBO but left against medical advice as his family did not feel comfortable with him receiving his medical care in [place]. He came to [facility] ED the next day with similar complaint and was found to have SBO. He was treated with supportive care with bowel rest and ng suction. However his symptoms did not improve. Surgery team was consulted and he was brough to OR for exploratory laparoscopy. He was found to have multiple sections of ischemic bowel which were resected. He was unable to be extubated post-operatively and PCCM was consulted.  Past Medical History  CVA, Dementia, Bladder CA s/p nephrostomy, tobacco use, right glottic carcinoma s/p excision, seizures.  Significant Hospital Events   12/08/2020 Admission 11/14/2020 OR 11/14/2020 Admit to ICU 12/10 Brief SVT with associated hypertension. Self limited Given fentanyl, metop and hydral for HTN 12/11 Good urine output with Lasix , failed extubation, reintubated within 2 hours due to inability to clear secretions 12/12 return OR for enteric leak-- found to have bowel necrosis. Left in discontinuity  12/13 self extubated, reintubated. On  pressors. Family meeting with PCCM and CCS to discuss possible next steps.  12/15-was back in OR, abdominal closure, RUQ mucus fistula and L ileostomy  12/19: No significant overnight events, high ileostomy output 12/21: scheduled for family meeting. Worse hypernatremia, continued high ileostomy output.  Attempted PSV/CPAP wean, pulled volumes of high 100s -low/mid 200s with tachypnea. Placed back of full support.  12/22 ongoing high output GI/ ileostomy, TPN started; weaned 12/5 all day; off fentanyl gtt, remains on precedex  12/24 IR drain , 1 unit PRBC transfusion for hemoglobin 6.5 12/26 added vancomycin for GPC in blood cultures and RIC suggesting MRSA Consults:  General Surgery PCCM  Procedures:  11/13/2020 Ex-lap, G-tube placement ETT 12/7 >> 12/11, >> 12/15- back to OR for abdominal closure, exploratory laparotomy  12/24 CT-guided right pelvic abscess drain  Significant Diagnostic Tests:   CT abd/pelvis 12/2 > High-grade distal small bowel obstruction, possibly due to adhesion. Mild ascites and diffuse mesenteric edema. No evidence of focal inflammatory process or abscess. Small bilateral pleural effusions and mild right lower lobe Atelectasis. CT abdomen/pelvis 12/23 Interval development of a large loculated collection in the low anterior abdomen and pelvis measuring approximately 6.1 x 11.3 x12.1 cm in size subjacent to the operative site. Few foci of internal gas are present. , no frank spillage of enteric contrast   Micro Data:  12/07/2020 COVID/Flu negative 12/7 BA: Few GNR rare GPC -- predominantly PMN  12/7 BCx> Neg 12/11 respiratory -no organism in tracheal aspirate 12/24 BC >> RIC showing both staph epidermis 1/4 12/24 abscess >> E. Faecalis, B fragilis (B lactamase) 12/27 trach aspirate>>  Antimicrobials:  Fluc 12/8>> 12/10 Zosyn 12/8> 12/12 cefotetain 12/15>> off Zosyn 12/23 >> eraxis 12/24 >> Vancomycin 12/26 Interim history/subjective:  Required reintubation  overnight due to increasing oxygen requirements and weakness.   Objective   Blood pressure (!) 107/54, pulse 77, temperature 99.4 F (37.4 C), temperature source Axillary, resp. rate (!) 28, height 5\' 11"  (1.803 m), weight 57.9 kg, SpO2 100 %.    Vent Mode: PRVC FiO2 (%):  [40 %] 40 % Set Rate:  [14 bmp] 14 bmp Vt Set:  [600 mL] 600 mL PEEP:  [5 cmH20] 5 cmH20 Pressure Support:  [12 cmH20] 12 cmH20 Plateau Pressure:  [25 cmH20] 25 cmH20   Intake/Output Summary (Last 24 hours) at 12/10/2020 0723 Last data filed at 12/10/2020 0700 Gross per 24 hour  Intake 7683.16 ml  Output 5390 ml  Net 2293.16 ml   Filed Weights   12/07/20 0500 12/09/20 0500 12/09/20 1427  Weight: 61.5 kg 65.5 kg 57.9 kg    Examination: General: critically ill appearing cachectic man laying in bed in NAD HEENT: Ronald Wolf/AT, eyes anicteric Pulmonary: tachypneic, copious tan secretions from ETT. Rhonchi partially cleared with suctioning. Cardiac: RRR, no murmur Abdomen: soft, mildly distended. Wound drain with purulent bloody output, ostomies with output Extremities no peripheral edema, no clubbing or cyanosis Neuro RASS -1, not answering questions but sometimes tracking. Not following commands.  CXR 12/28 reviewed> ETT in appropriate position, bilateral lower lobe opacities persist silhouetting hemidiaphgrams.  Resolved Hospital Problem list   SVT  Assessment & Plan:   Sepsis due to intra-abdominal abscess. GPC in blood likely contaminant rather than bacteremia. Worrisome increase in WBC over past few days. S/p IR drain into intrabdominal abscess -Continue Zosyn, duration to be determined -Continue IV Eraxis -Con't IV vancomycin -trach aspirate culture pending  Acute encephalopathy, multifactorial  Plan -avoid deliriogenic meds -PAD protocol with goal RASS 0 to -1  Acute hypoxic respiratory failure requiring mechanical ventilation  Has been intubated x3 during this hospitalization Pleural effusions,  improved - Secondary to malnutrition, low albumin -Has failed extubation multiple times, significantly more debilitated than for prior attempts - do not anticipate will be able to be successfully extubated Likely bilateral lower lobe pneumonias  Plan  -Needs tracheostomy per family's wishes. We have discussed that tracheostomy is not curative and will not likely change the outcome of his course. -con't LTVV; vent pressures appropriate -No SAT & SBT today due to failed self-extubation yesterday. Will need prolonged vent wean. -VAP prevention protocol -con't CPT with hypertonic saline nebs -con't empiric antibiotics- deescalate when able; respiratory culture pending  SBO Ischemic bowel perforation  -s/p 2 ex laps, resection of ischemic bowel, mucus fistula and ileostomy, G tube placement  Intraabdominal abscess s/p IR drain High output ileostomy with bicarb wasting Plan -Continue routine dressing and ostomy care -Family is aware that his nutritional, acid base issues, and chronic hydration challenges make him unlikely to ever be able to live outside of a SNF  AKI  Acute metabolic acidosis; likely due to GI bicarb loss & hyperchloremia Serum creatinine holding in the 1.3 range BUN holding in the 50 range Plan -con't bicarb supplementation- decrease rate today -con't to monitor renal function -strict I/Os -Renally dose medications -Avoid nephrotoxins  HTN AFib w/ RVR Has been in sinus rhythm Plan -Continue telemetry monitoring -Continue low dose metoprolol -Not a candidate for full-dose anticoagulation currently  Severe protein calorie malnutrition Plan -Continue TPN per pharmacy -trickle TF per surgery's recommendations  Hx seizure Has had no witnessed  seizure Plan -Continue Keppra  History of bladder cancer -Maintain routine urostomy care  Anemia , no obvious source of ongoing blood loss s/p 1 unit PRBC on 12/24 Hemoglobin has drifted down approximately 1 g  posttransfusion Plan -con't to monitor periodically -transfuse for Hb <7 or hemodynamically significant bleeding  Goals of Care -poor baseline due to multiple medical problems, post emergent surgery with slow recovery, failed extubations (12/11, 12/13) due to general deconditioning and inability to clear secretions.  -12/13 Family discussion with CCS and PCCM. Family has decided to pursue all offered aggressive interventions (return OR for abd, trach, full code) -Palliative care consult appreciated for ongoing GOC discussions -Family meeting held again 12/02/20 -- decided they want to proceed with tracheostomy (for more comfort) + ongoing aggressive medical management for now with ongoing GOC discussions -Guarded prognosis given prior debility and ongoing clinical decline despite aggressive care; Appreciate PMT assistance.  Family to consider changing to DNR over Christmas gatherings.   -Updated sister Rosey Bath and daughter Tiajuana Amass on 12/27 -daughter Tiajuana Amass updated 12/28 & 12/29 via phone  Best practice (evaluated daily)  Diet: TPN, trickle TF Pain/Anxiety/Delirium protocol (if indicated): Fent gtt-> prn, precedex VAP protocol (if indicated): Yes  DVT prophylaxis: SQH  GI prophylaxis: PPI Glucose control: SSI  Mobility: BR  Family updated - 12/21, 12/27 Family meeting- last 12/27; ongoing  Code Status: Full Disposition: ICU  This patient is critically ill with multiple organ system failure which requires frequent high complexity decision making, assessment, support, evaluation, and titration of therapies. This was completed through the application of advanced monitoring technologies and extensive interpretation of multiple databases. During this encounter critical care time was devoted to patient care services described in this note for 35 minutes.  Steffanie Dunn, DO 12/10/20 8:37 AM [facility]

## 2020-12-10 NOTE — Progress Notes (Signed)
PHARMACY - TOTAL PARENTERAL NUTRITION CONSULT NOTE  Indication: Small bowel obstruction, prolonged ileus  Patient Measurements: Height: 5\' 11"  (180.3 cm) Weight: 61.2 kg (134 lb 14.7 oz) IBW/kg (Calculated) : 75.3 TPN AdjBW (KG): 60.8 Body mass index is 18.82 kg/m. Usual Weight: 137 lbs  Assessment:  74 YOM presented 11/13/2020 with nausea, vomiting, abdominal pain after leaving AMA at [facility] with known SBO. Patient has been on bowel rest with NG tube decompression. Patient continued to have persistent SBO on imaging with no evidence of improvement, now s/p ex-lap with LoA, SBR and placement of Gtube on 12-01-2020. Pharmacy consulted for TPN management - patient received TPN 11/19/20 through 11/29/20.  Reconsulted to resume TPN on 12/21 despite tube feeds at goal rate due to high ostomy output and concern for tube feeds not absorbing. Patient on loperamide and fiber without improvement in output.   Glucose / Insulin: no hx DM - CBGs <180 on TPN - utilized 7 units SSI/24hrs Electrolytes: K 4, Na 139, Phos 2.9, Mg 2, Bicarb 20>26 (on supplement + Max acetate), Cl decreasing On FW 1/22 q4h Renal: Scr 1.43>1.15, BUN 53 LFTs / TGs: LFTs and TG wnl, Tbili 1.6 Prealbumin / albumin: Prealbumin down 7.7 / albumin 1.6 Intake / Output; MIVF: UOP 1.5>2.1 ml/kg/hr, high ileostomy output (on fiber, opium tincture, loperamide and unlikely to improve per Surgery), drain O/P 80 ml, net -8L GI Imaging:  12/11 DG Abd - stomach gas-filled, without distention, no obvious free air 12/23 CT abd pelvis - large loculated collection in low abd/pelvis, few foci of internal gas present, new RUQ ileostomy, thickened distal SB/colon, subtotal atelectatic collapse of LLL, partial collapse of RLL, small R pleural effusion, body wall edema Surgeries / Procedures: 12/12: re-exploration and washout with new copious drainage from incision concerning for enteric leak vs. enterotomy >> now s/p, bowel necrosis  resected 12/15: ex-lap with ileostomy with fistula creation, abdominal closure 12/24 Pelvic abscess drain  Central access: PICC placed 12-01-20 TPN start date: 11/19/20>>11/29/20; restart 12/03/20 >>  Nutritional Goals (updated per RD rec 12/21): 2300-2700 kCal, 120-135g protein, >2L fluid per day  Goal TPN rate of 100 cc/hr will provide 120g AA, 432g CHO, and 36g lipids (15% of total kCal due to 1/22) for a total of 2308 kcal, meeting 100% of needs  Current Nutrition:  TPN Osmolite 1.5 at 20 ml/hr  ProSource TF 45 ml TID (each 11g AA and 40 kcal)  Plan:  - Continue TPN at 100 ml/hr at 1800  - TPN will provide 120g protein, 432g CHO, 36g SMOF lipids (~15% of total kcal due to [employer]), and 2308 kcal meeting 100% of estimated needs - Electrolytes in TPN: Na 20 mEq/L, K 65mEq/L,Phos 68mEq/L, Continue Max acetate - Add standard MVI and trace elements + folic acid   - Continue moderate SSI to q8h and adjust as needed - Continue FW 200 PT q4h per MD  - TPN labs in AM - F/u TF tolerability with increase, adjust TPN accordingly     Thank you for involving pharmacy in this patient's care.  12m, PharmD Clinical Pharmacist Please check AMION for all [facility] Pharmacy numbers 12/10/2020 7:31 AM

## 2020-12-10 NOTE — Procedures (Signed)
Diagnostic Bronchoscopy  Ronald Wolf  [id number]  1946/01/18  Date:12/10/20  Time:3:02 PM   Provider Performing:Kahleb Mcclane Audrie Lia   Procedure: Diagnostic Bronchoscopy (40814)  Indication(s) Assist with direct visualization of tracheostomy placement  Consent Risks of the procedure as well as the alternatives and risks of each were explained to the patient and/or caregiver.  Consent for the procedure was obtained.   Anesthesia See separate tracheostomy note   Time Out Verified patient identification, verified procedure, site/side was marked, verified correct patient position, special equipment/implants available, medications/allergies/relevant history reviewed, required imaging and test results available.   Sterile Technique Usual hand hygiene, masks, gowns, and gloves were used   Procedure Description Bronchoscope advanced through endotracheal tube and into airway.  After suctioning out tracheal secretions, bronchoscope used to provide direct visualization of tracheostomy placement.  Additional endobronchial secretions suctioned from R mainstem, BI, and RLL bronchi due to copious, thick secretions. 30cc saline instilled for lavage. Normal airways at conclusion of procedure.   Complications/Tolerance None; patient tolerated the procedure well.   EBL None  Specimen(s) None   Ronald Dunn, DO 12/10/20 3:03 PM [facility]

## 2020-12-10 NOTE — Progress Notes (Signed)
CPT held at this time due to bedside tracheostomy being performed.

## 2020-12-10 NOTE — Progress Notes (Signed)
RT assisted Dr. Katrinka Blazing and Dr. Chestine Spore with bedside tracheostomy. A #6 cuffed Shiley was placed. Pt tolerated procedure well. RT will continue to monitor.

## 2020-12-10 NOTE — Procedures (Signed)
Percutaneous Tracheostomy Procedure Note   Ronald Wolf  [id number]  [birth year]  Date:12/10/20  Time:3:14 PM   Provider Performing:Nayson Traweek C Katrinka Blazing  Procedure: Percutaneous Tracheostomy with Bronchoscopic Guidance (26415)  Indication(s) Persistent respiratory failure  Consent Risks of the procedure as well as the alternatives and risks of each were explained to the patient and/or caregiver.  Consent for the procedure was obtained.  Anesthesia Etomidate, Versed, Fentanyl, Vecuronium   Time Out Verified patient identification, verified procedure, site/side was marked, verified correct patient position, special equipment/implants available, medications/allergies/relevant history reviewed, required imaging and test results available.   Sterile Technique Maximal sterile technique including sterile barrier drape, hand hygiene, sterile gown, sterile gloves, mask, hair covering.    Procedure Description Appropriate anatomy identified by palpation.  Patient's neck prepped and draped in sterile fashion.  1% lidocaine with epinephrine was used to anesthetize skin overlying neck.  1.5cm incision made and blunt dissection performed until tracheal rings could be easily palpated.   Then a size 6 Shiley tracheostomy was placed under bronchoscopic visualization using usual Seldinger technique and serial dilation.   Bronchoscope confirmed placement above the carina.  Tracheostomy was sutured in place with adhesive pad to protect skin under pressure.    Patient connected to ventilator.   Complications/Tolerance None; patient tolerated the procedure well. Chest X-ray is ordered to confirm no post-procedural complication.   EBL Minimal   Specimen(s) None

## 2020-12-10 NOTE — Plan of Care (Signed)
°  Problem: Clinical Measurements: Goal: Ability to maintain clinical measurements within normal limits will improve Outcome: Progressing Goal: Cardiovascular complication will be avoided Outcome: Progressing   Problem: Nutrition: Goal: Adequate nutrition will be maintained Outcome: Progressing   Problem: Pain Managment: Goal: General experience of comfort will improve Outcome: Progressing   Problem: Safety: Goal: Ability to remain free from injury will improve Outcome: Progressing   

## 2020-12-10 NOTE — Plan of Care (Signed)
Daughter Tiajuana Amass provided phone consent for perc trach & bronchoscopy.  Steffanie Dunn, DO 12/10/20 2:21 PM [facility]

## 2020-12-11 DIAGNOSIS — J9601 Acute respiratory failure with hypoxia: Secondary | ICD-10-CM | POA: Diagnosis not present

## 2020-12-11 DIAGNOSIS — Z930 Tracheostomy status: Secondary | ICD-10-CM

## 2020-12-11 DIAGNOSIS — N179 Acute kidney failure, unspecified: Secondary | ICD-10-CM | POA: Diagnosis not present

## 2020-12-11 DIAGNOSIS — Z9911 Dependence on respirator [ventilator] status: Secondary | ICD-10-CM | POA: Diagnosis not present

## 2020-12-11 LAB — COMPREHENSIVE METABOLIC PANEL
ALT: 19 U/L (ref 0–44)
AST: 22 U/L (ref 15–41)
Albumin: 1.3 g/dL — ABNORMAL LOW (ref 3.5–5.0)
Alkaline Phosphatase: 79 U/L (ref 38–126)
Anion gap: 10 (ref 5–15)
BUN: 46 mg/dL — ABNORMAL HIGH (ref 8–23)
CO2: 37 mmol/L — ABNORMAL HIGH (ref 22–32)
Calcium: 6.9 mg/dL — ABNORMAL LOW (ref 8.9–10.3)
Chloride: 92 mmol/L — ABNORMAL LOW (ref 98–111)
Creatinine, Ser: 1.04 mg/dL (ref 0.61–1.24)
GFR, Estimated: >60 mL/min (ref >60)
Glucose, Bld: 139 mg/dL — ABNORMAL HIGH (ref 70–99)
Potassium: 3.5 mmol/L (ref 3.5–5.1)
Sodium: 139 mmol/L (ref 135–145)
Total Bilirubin: 0.9 mg/dL (ref 0.3–1.2)
Total Protein: 5.3 g/dL — ABNORMAL LOW (ref 6.5–8.1)

## 2020-12-11 LAB — GLUCOSE, CAPILLARY
Glucose-Capillary: 152 mg/dL — ABNORMAL HIGH (ref 70–99)
Glucose-Capillary: 165 mg/dL — ABNORMAL HIGH (ref 70–99)
Glucose-Capillary: 136 mg/dL — ABNORMAL HIGH (ref 70–99)

## 2020-12-11 LAB — CULTURE, RESPIRATORY W GRAM STAIN: Culture: NORMAL

## 2020-12-11 LAB — MAGNESIUM: Magnesium: 1.9 mg/dL (ref 1.7–2.4)

## 2020-12-11 LAB — PHOSPHORUS: Phosphorus: 2.8 mg/dL (ref 2.5–4.6)

## 2020-12-11 MED ORDER — OSMOLITE 1.5 CAL PO LIQD
1000.0000 mL | ORAL | Status: DC
  Administered 2020-12-11 – 2020-12-21 (×7): 1000 mL

## 2020-12-11 MED ORDER — POTASSIUM CHLORIDE 10 MEQ/50ML IV SOLN
10.0000 meq | INTRAVENOUS | Status: AC
  Administered 2020-12-11 (×4): 10 meq via INTRAVENOUS
  Filled 2020-12-11 (×4): qty 50

## 2020-12-11 MED ORDER — OSMOLITE 1.5 CAL PO LIQD: 1000.0000 mL | ORAL | Status: DC

## 2020-12-11 MED ORDER — TRAVASOL 10 % IV SOLN
INTRAVENOUS | Status: AC
  Filled 2020-12-11: qty 1200

## 2020-12-11 NOTE — Evaluation (Signed)
Physical Therapy Evaluation Patient Details Name: JENNIFER QUINTOS MRN: [id number] DOB: 09-15-1946 Today's Date: 12/11/2020   History of Present Illness  Pt is a 74 y.o. male admitted 12/04/2020 with abdominal pain and nausea; workup revealed SBO, s/p NGT placement. Also with AKI. Pt is now s/p multiple ex lap with revisions and placement of G-tube. Pt self-extubated 3 times, is now s/p placement of trach on 12/29. PMH includes stroke, HTN, bladder CA s/p urostomy, seizure, Hep C, anxiety, depression.  Clinical Impression   The pt was agreeable to re-evaluation at this time. The pt was able to visually track therapist around the room and respond to verbal, tactile, and noxious stimuli. The pt presents with decreased strength, activity tolerance, attention, and command following at this time, as he followed 3 total commands with his LUE, but none with other extremities. The pt responded with general grimacing to noxious stimuli in LUE, and localized withdrawal in BLE, but no response in RUE. The pt was unable to tolerate any rolling or attempt transition to sitting in bed at this time, and would require totalA of 2 with use of a lift to mobilize safely with abdominal wounds, drains, and ostomies. The pt will continue to benefit from skilled PT to progress functional strength and responsiveness to eventually allow for progression to improved tolerance for bed mobility and OOB transfers.      Follow Up Recommendations SNF;Supervision/Assistance - 24 hour    Equipment Recommendations   (defer to post acute)    Recommendations for Other Services       Precautions / Restrictions Precautions Precautions: Fall;Other (comment) Precaution Comments: trach, PEG, urostomy, colostomy, gastrostomy, open abdomen, drain R lower abdomen Restrictions Weight Bearing Restrictions: No      Mobility  Bed Mobility Overal bed mobility: Needs Assistance Bed Mobility: Supine to Sit     Supine to sit: Total  assist;HOB elevated     General bed mobility comments: totalA, pt unable to initiate roll or pull trunk forward from [facility], no change with elevation of HOB. RN asked to keep [place] <60 due to open abdomen    Transfers                 General transfer comment: unable           Pertinent Vitals/Pain Pain Assessment: Faces Faces Pain Scale: Hurts even more Pain Location: generalized, to noxious stimuli. RLE ankle DF gets strongest reaction Pain Descriptors / Indicators: Grimacing Pain Intervention(s): Monitored during session;Repositioned    Home Living                   Additional Comments: pt not speaking at time of evaluation, did not nod consistently to yes/no questions. will need to confirm history and home support with family    Prior Function Level of Independence: Needs assistance         Comments: Questionable historian and not consistently answering questions - reports ambulates with cane. Pt with urostomy bag - from prior charting     Hand Dominance        Extremity/Trunk Assessment   Upper Extremity Assessment Upper Extremity Assessment: Generalized weakness;RUE deficits/detail;LUE deficits/detail RUE Deficits / Details: no active command following +clonus at wrist, significant swelling in R hand, no response to noxious stimuli LUE Deficits / Details: localized response to noxious stimuli, squeezed hand to command once, able to slow fall when asked to hold his hand up in the air, did not raise or move elbow to commands but  resisted all movement    Lower Extremity Assessment Lower Extremity Assessment: Generalized weakness;RLE deficits/detail;LLE deficits/detail RLE Deficits / Details: grimacing and localized withdrawal to noxious stimuli, no active movement to command LLE Deficits / Details: grimacing and localized withdrawal to noxious stimuli, no active movement to command    Cervical / Trunk Assessment Cervical / Trunk Assessment: Other  exceptions Cervical / Trunk Exceptions: open abdomen, significant wounds  Communication   Communication: Expressive difficulties (trach)  Cognition Arousal/Alertness: Lethargic Behavior During Therapy: Flat affect Overall Cognitive Status: No family/caregiver present to determine baseline cognitive functioning                                 General Comments: very flat, making eye contact but no attempt to nod yes/no to communicate. visually tracking in room. followed intermittent commands with LUE, not with other extremities.      General Comments General comments (skin integrity, edema, etc.): HR 90-100 with mobility, VSS stable. Vent setting: 30% FiO2, PEEP of 5    Exercises Other Exercises Other Exercises: PROM to BUE and BLE, pt grimacing with RLE movement at ankle and hip   Assessment/Plan    PT Assessment Patient needs continued PT services  PT Problem List Decreased strength;Decreased activity tolerance;Decreased balance;Decreased mobility;Decreased cognition;Decreased knowledge of use of DME;Decreased safety awareness;Decreased range of motion;Decreased coordination;Pain       PT Treatment Interventions DME instruction;Gait training;Stair training;Functional mobility training;Therapeutic activities;Therapeutic exercise;Balance training;Cognitive remediation;Patient/family education;Wheelchair mobility training    PT Goals (Current goals can be found in the Care Plan section)  Acute Rehab PT Goals Patient Stated Goal: none stated PT Goal Formulation: With patient Time For Goal Achievement: 12/25/20 Potential to Achieve Goals: Fair    Frequency Min 3X/week    AM-PAC PT "6 Clicks" Mobility  Outcome Measure Help needed turning from your back to your side while in a flat bed without using bedrails?: Total Help needed moving from lying on your back to sitting on the side of a flat bed without using bedrails?: Total Help needed moving to and from a bed to a  chair (including a wheelchair)?: Total Help needed standing up from a chair using your arms (e.g., wheelchair or bedside chair)?: Total Help needed to walk in hospital room?: Total Help needed climbing 3-5 steps with a railing? : Total 6 Click Score: 6    End of Session Equipment Utilized During Treatment: Oxygen (trach vent) Activity Tolerance: No increased pain Patient left: in bed;with call bell/phone within reach;with bed alarm set Nurse Communication: Mobility status PT Visit Diagnosis: Other abnormalities of gait and mobility (R26.89);Muscle weakness (generalized) (M62.81)    Time: 3419-3790 PT Time Calculation (min) (ACUTE ONLY): 18 min   Charges:   PT Evaluation $PT Re-evaluation: 1 Re-eval          Rolm Baptise, PT, DPT   Acute Rehabilitation Department Pager #: [phone]  [name] 12/11/2020, 5:11 PM

## 2020-12-11 NOTE — Progress Notes (Signed)
PHARMACY - TOTAL PARENTERAL NUTRITION CONSULT NOTE  Indication: Small bowel obstruction, prolonged ileus  Patient Measurements: Height: 5\' 11"  (180.3 cm) Weight: 61.2 kg (134 lb 14.7 oz) IBW/kg (Calculated) : 75.3 TPN AdjBW (KG): 60.8 Body mass index is 18.82 kg/m. Usual Weight: 137 lbs  Assessment:  Ronald Wolf presented 11/13/2020 with nausea, vomiting, abdominal pain after leaving AMA at [facility] with known SBO. Patient has been on bowel rest with NG tube decompression. Patient continued to have persistent SBO on imaging with no evidence of improvement, now s/p ex-lap with LoA, SBR and placement of Gtube on 12/10/2020. Pharmacy consulted for TPN management - patient received TPN 11/19/20 through 11/29/20.  Reconsulted to resume TPN on 12/21 despite tube feeds at goal rate due to high ostomy output and concern for tube feeds not absorbing. Patient on loperamide and fiber without improvement in output.   Glucose / Insulin: no hx DM - CBGs <180 on TPN - utilized 8 units SSI/24hrs Electrolytes: K 4>3.5, Na 139, Phos 2.8, Mg 1.9, Bicarb 26>37 (on supplement + Max acetate), Cl decreasing On FW 1/22 q4h Renal: Scr 1.43>1.04, BUN 53>46 LFTs / TGs: LFTs and TG wnl, Tbili 1.6>0.9 Prealbumin / albumin: Prealbumin down 7.7 / albumin 1.6>1.3 Intake / Output; MIVF: UOP 1.5>2.3 ml/kg/hr, high ileostomy output 1700>1157ml (on fiber, opium tincture, loperamide and unlikely to improve per Surgery), drain O/P 80>15 ml, net -4L GI Imaging:  12/11 DG Abd - stomach gas-filled, without distention, no obvious free air 12/23 CT abd pelvis - large loculated collection in low abd/pelvis, few foci of internal gas present, new RUQ ileostomy, thickened distal SB/colon, subtotal atelectatic collapse of LLL, partial collapse of RLL, small R pleural effusion, body wall edema Surgeries / Procedures: 12/12: re-exploration and washout with new copious drainage from incision concerning for enteric leak vs. enterotomy >> now s/p,  bowel necrosis resected 12/15: ex-lap with ileostomy with fistula creation, abdominal closure 12/24 Pelvic abscess drain 12/29 - trach placed  Central access: PICC placed 12/08/2020 TPN start date: 11/19/20>>11/29/20; restart 12/03/20 >>  Nutritional Goals (updated per RD rec 12/21): 2300-2700 kCal, 120-135g protein, >2L fluid per day  Goal TPN rate of 100 cc/hr will provide 120g AA, 432g CHO, and 36g lipids (15% of total kCal due to 1/22) for a total of 2308 kcal, meeting 100% of needs  Current Nutrition:  TPN Osmolite 1.5 at 20 ml/hr  ProSource TF 45 ml TID (each 11g AA and 40 kcal)  Plan:  - Continue TPN at 100 ml/hr at 1800  - TPN will provide 120g protein, 432g CHO, 36g SMOF lipids (~15% of total kcal due to [employer]), and 2308 kcal meeting 100% of estimated needs - Electrolytes in TPN: Na 20 mEq/L, K 13mEq/L,Phos 47mEq/L,  Adjust Cl/:Acetate to 1:1 - Add standard MVI and trace elements + folic acid   - Continue moderate SSI to q8h and adjust as needed - Continue FW 200 PT q4h per MD  - BMP in AM - F/u TF tolerability with increase, adjust TPN accordingly     Thank you for involving pharmacy in this patient's care.  12m, PharmD Clinical Pharmacist Please check AMION for all [facility] Pharmacy numbers 12/11/2020 7:18 AM

## 2020-12-11 NOTE — Consult Note (Signed)
WOC Nurse ostomy follow up Stoma type/location: RLQ urostomy; LLQ ileostomy, RUQ mucus fistula Stomal assessment/size: urostomy budded, red, moist, measures 1 inch round; no surrounding skin irritation. LLQ ileostomy stoma red, moist, producing liquid yellow stool. Some inferior MASD from effluent contact with skin. Area crusted, barrier ring applied, pouched with high output pouch. Mucus fistula with some mucocutaneous separation and sloughing. No output observed in existing pouch. Half of a square foam dressing placed over this stoma. Peristomal assessment:  Treatment options for stomal/peristomal skin:  Output: as above Ostomy pouching: 2pc. 2 and 1/4 inch urostomy system--see orders from today for [facility] numbers.  2 pc 2 and 3/4 inch high output system for ileostomy. See orders from today for [facility] numbers. Education provided: none Enrolled patient in [employer] Discharge program: No, patient to discharge to LTAC. Helmut Muster, RN, MSN, CWOCN, CNS-BC, pager [phone]

## 2020-12-11 NOTE — Progress Notes (Signed)
Supervising Physician: Malachy Moan  Patient Status:  [facility] - In-pt  Chief Complaint: Intra-abdominal fluid collection  Subjective: Remains intubated s/p trach.  Opens eyes, but no interaction, falls asleep quickly.  Drain in place with purulent output.  Tube feeds resumed.  Allergies: Iohexol  Medications: Prior to Admission medications   Medication Sig Start Date End Date Taking? Authorizing Provider  amLODipine (NORVASC) 10 MG tablet Take 1 tablet (10 mg total) by mouth daily. 02/11/20  Yes Bradd Canary, MD  atorvastatin (LIPITOR) 80 MG tablet TAKE 1 TABLET(80 MG) BY MOUTH DAILY AT 6 PM 02/11/20  Yes Bradd Canary, MD  carvedilol (COREG) 3.125 MG tablet Take 1 tablet (3.125 mg total) by mouth 2 (two) times daily with a meal. 02/11/20  Yes Bradd Canary, MD  clopidogrel (PLAVIX) 75 MG tablet TAKE 1 TABLET(75 MG) BY MOUTH DAILY Patient taking differently: Take 75 mg by mouth daily. TAKE 1 TABLET(75 MG) BY MOUTH DAILY 08/21/20  Yes Bradd Canary, MD  folic acid (FOLVITE) 1 MG tablet Take 1 tablet (1 mg total) by mouth daily. 02/11/20  Yes Bradd Canary, MD  levETIRAcetam (KEPPRA) 750 MG tablet TAKE 1 TABLET(750 MG) BY MOUTH TWICE DAILY Patient taking differently: Take 750 mg by mouth 2 (two) times daily.  06/30/20  Yes Bradd Canary, MD  mirtazapine (REMERON) 15 MG tablet Take 15 mg by mouth at bedtime. 11/04/20  Yes [provider]  tetrahydrozoline-zinc (VISINE-AC) 0.05-0.25 % ophthalmic solution Place 1 drop into both eyes daily as needed (dry eyes).    Yes [provider]     Vital Signs: BP (!) 146/63   Pulse 91   Temp 98.1 F (36.7 C) (Oral)   Resp 18   Ht 5\' 11"  (1.803 m)   Wt 128 lb 1.4 oz (58.1 kg)   SpO2 100%   BMI 17.86 kg/m   Physical Exam  NAD, alert Abdomen: Large midline wound. Drain in place. Insertion site intact.   Bloody, purulent output. Flushes easily.  80 mL documented in last 24 hrs   Imaging: DG Chest Port 1  View  Result Date: 12/10/2020 CLINICAL DATA:  Status post tracheostomy. EXAM: PORTABLE CHEST 1 VIEW COMPARISON:  Radiograph yesterday. FINDINGS: New tracheostomy tube tip at the thoracic inlet, the upper portion is not included in the field of view. Left subclavian central line remains in place. No significant change in hazy bilateral lung opacities likely combination of atelectasis and effusions. Stable heart size and mediastinal contours. No pneumothorax or pneumomediastinum. No pulmonary edema. Stable osseous structures. IMPRESSION: 1. New tracheostomy tube tip at the thoracic inlet. 2. No significant change in hazy bilateral lung opacities likely combination of atelectasis and effusions. Electronically Signed   By: 12/12/2020 M.D.   On: 12/10/2020 15:35   DG CHEST PORT 1 VIEW  Result Date: 12/09/2020 CLINICAL DATA:  Intubated, respiratory failure EXAM: PORTABLE CHEST 1 VIEW COMPARISON:  12/08/2020 FINDINGS: 2 frontal views of the chest demonstrate endotracheal tube overlying tracheal air column tip midway between thoracic inlet and carina. Left-sided central venous catheter tip overlies superior vena cava. Persistent bibasilar veiling opacities unchanged. No pneumothorax. Cardiac silhouette is stable. IMPRESSION: 1. Stable support devices. 2. Persistent bibasilar consolidation and effusions.  No change. Electronically Signed   By: 12/10/2020 M.D.   On: 12/09/2020 19:21   DG Chest Port 1 View  Result Date: 12/08/2020 CLINICAL DATA:  74 year old male with respiratory failure. Status post exploratory laparotomy  or earlier this month with resection of ischemic small bowel. EXAM: PORTABLE CHEST 1 VIEW COMPARISON:  Portable chest 12/06/2020 and earlier. FINDINGS: Portable AP semi upright view at 0524 hours. Stable endotracheal tube tip between the clavicles and carina. Stable left PICC line. No enteric tube. Stable lung volumes with veiling lung base opacity greater on the right. Dense  retrocardiac opacity. Upper lungs are clear without pneumothorax or pulmonary edema. Mediastinal contours remain within normal limits. IMPRESSION: 1. Stable lines and tubes. 2. Stable ventilation with bilateral pleural effusions and lower lobe collapse or consolidation. Electronically Signed   By: Genevie Ann M.D.   On: 12/08/2020 07:46    Labs:  CBC: Recent Labs    12/06/20 0103 12/07/20 0533 12/08/20 0428 12/10/20 0500  WBC 18.4* 21.1* 23.8* 25.4*  HGB 8.5* 7.4* 7.1* 7.1*  HCT 26.5* 23.1* 23.7* 22.9*  PLT 269 252 245 327    COAGS: Recent Labs    12/16/19 1515  INR 1.0  APTT 24    BMP: Recent Labs    12/20/19 0546 12/21/19 0439 12/27/19 0548 12/31/19 0221 12/11/2020 1830 12/08/20 1837 12/09/20 0415 12/10/20 0500 12/11/20 0329  NA 146* 140 142 138   < > 137 135 139 139  K 2.8* 3.8 4.3 4.4   < > 3.8 3.6 4.0 3.5  CL 115* 110 108 107   < > 112* 108 103 92*  CO2 22 19* 25 21*   < > 16* 20* 26 37*  GLUCOSE 118* 98 98 113*   < > 130* 163* 159* 139*  BUN 19 15 22  34*   < > 59* 63* 53* 46*  CALCIUM 8.5* 8.5* 9.0 9.0   < > 7.6* 7.5* 7.4* 6.9*  CREATININE 1.14 0.87 1.02 1.21   < > 1.37* 1.43* 1.15 1.04  GFRNONAA >60 >60 >60 59*   < > 54* 51* >60 >60  GFRAA >60 >60 >60 >60  --   --   --   --   --    < > = values in this interval not displayed.    LIVER FUNCTION TESTS: Recent Labs    12/05/20 0607 12/06/20 0103 12/08/20 0428 12/11/20 0329  BILITOT 0.9 1.3* 1.6* 0.9  AST 20 17 18 22   ALT 20 15 17 19   ALKPHOS 85 67 69 79  PROT 5.5* 5.6* 5.4* 5.3*  ALBUMIN 2.2* 2.3* 1.6* 1.3*    Assessment and Plan: SBO s/p ex lap with SBR, placement of a gastrostomy tube12/7 Takeback 12/12 by Dr. Zenia Resides for necrotic SB with resection of additional 60-70cm of SB inclusive of prior anastomosis in discontinuity with open abdomen.  S/P ex lap, ileostomy, mucous fistula, closure 12/15 Abscess by CT 12/23 s/p perc drain on 12/24 by Dr. Vernard Gambles Patient s/p trach yesterday.  Awakens, but  falls asleep quickly. Surgery working to advance enteral nutrition.  Drain in place without issues. Flushes easily. 80 mL yesterday Continue current drain management.  Electronically Signed: Docia Barrier, PA 12/11/2020, 1:58 PM   I spent a total of 15 Minutes at the the patient's bedside AND on the patient's hospital floor or unit, greater than 50% of which was counseling/coordinating care for intra-abdominal fluid collection.

## 2020-12-11 NOTE — Progress Notes (Signed)
NAME:  Ronald Wolf, MRN:  [id number], DOB:  February 15, 1946, LOS: 58 ADMISSION DATE:  11/28/2020, CONSULTATION DATE:  12/08/2020 REFERRING MD: Ronald Wolf CHIEF COMPLAINT:  hypotension   Brief History   Ronald Wolf is a 74 yo M w/ PMH of CVA, Seizures, Dementia, Bladder ca s/p urostomy, tobacco use, right glottic carcinoma s/p excision presenting with sbo. Found to have ischemic bowel requiring ex-lap 12/7.  Course complicated by septic shock, acute respiratory failure and AKI return to the OR 12/12 for necrotic small bowel resection of additional 70 cm inclusive of prior anastomosis 12/15 abdominal closure with ileostomy  History of present illness   Ronald Wolf is a 74 yo M w/ above PMH who initially presented to [facility] hospital on 11/11/20 with abdominal pain and nausea of 4-5 day duration. He was found to have SBO but left against medical advice as his family did not feel comfortable with him receiving his medical care in [place]. He came to [facility] ED the next day with similar complaint and was found to have SBO. He was treated with supportive care with bowel rest and ng suction. However his symptoms did not improve. Surgery team was consulted and he was brough to OR for exploratory laparoscopy. He was found to have multiple sections of ischemic bowel which were resected. He was unable to be extubated post-operatively and PCCM was consulted.  Past Medical History  CVA, Dementia, Bladder CA s/p nephrostomy, tobacco use, right glottic carcinoma s/p excision, seizures.  Significant Hospital Events   11/19/2020 Admission 12/11/2020 OR 11/20/2020 Admit to ICU 12/10 Brief SVT with associated hypertension. Self limited Given fentanyl, metop and hydral for HTN 12/11 Good urine output with Lasix , failed extubation, reintubated within 2 hours due to inability to clear secretions 12/12 return OR for enteric leak-- found to have bowel necrosis. Left in discontinuity  12/13 self extubated, reintubated. On  pressors. Family meeting with PCCM and CCS to discuss possible next steps.  12/15-was back in OR, abdominal closure, RUQ mucus fistula and L ileostomy  12/19: No significant overnight events, high ileostomy output 12/21: scheduled for family meeting. Worse hypernatremia, continued high ileostomy output.  Attempted PSV/CPAP wean, pulled volumes of high 100s -low/mid 200s with tachypnea. Placed back of full support.  12/22 ongoing high output GI/ ileostomy, TPN started; weaned 12/5 all day; off fentanyl gtt, remains on precedex  12/24 IR drain , 1 unit PRBC transfusion for hemoglobin 6.5 12/26 added vancomycin for GPC in blood cultures and RIC suggesting MRSA Consults:  General Surgery PCCM  Procedures:  11/30/2020 Ex-lap, G-tube placement ETT 12/7 >> 12/11, >> 12/15- back to OR for abdominal closure, exploratory laparotomy  12/24 CT-guided right pelvic abscess drain 12/29 trach Significant Diagnostic Tests:   CT abd/pelvis 12/2 > High-grade distal small bowel obstruction, possibly due to adhesion. Mild ascites and diffuse mesenteric edema. No evidence of focal inflammatory process or abscess. Small bilateral pleural effusions and mild right lower lobe Atelectasis. CT abdomen/pelvis 12/23 Interval development of a large loculated collection in the low anterior abdomen and pelvis measuring approximately 6.1 x 11.3 x12.1 cm in size subjacent to the operative site. Few foci of internal gas are present. , no frank spillage of enteric contrast   Micro Data:  12/11/2020 COVID/Flu negative 12/7 BA: Few GNR rare GPC -- predominantly PMN  12/7 BCx> Neg 12/11 respiratory -no organism in tracheal aspirate 12/24 BC >> RIC showing both staph epidermis 1/4 12/24 abscess >> E. Faecalis, B fragilis (B lactamase) 12/27 trach  aspirate>> normal flora  Antimicrobials:  Fluc 12/8>> 12/10 Zosyn 12/8> 12/12 cefotetain 12/15>> off Zosyn 12/23 >> eraxis 12/24 >> Vancomycin 12/26 Interim history/subjective:   Trached yesterday. Off sedation, awake but sleepy.  Objective   Blood pressure (!) 189/64, pulse (!) 112, temperature 98.1 F (36.7 C), temperature source Oral, resp. rate (!) 29, height 5\' 11"  (1.803 m), weight 58.1 kg, SpO2 100 %.    Vent Mode: PRVC FiO2 (%):  [30 %-40 %] 30 % Set Rate:  [14 bmp] 14 bmp Vt Set:  [600 mL] 600 mL PEEP:  [5 cmH20] 5 cmH20 Plateau Pressure:  [17 cmH20-23 cmH20] 19 cmH20   Intake/Output Summary (Last 24 hours) at 12/11/2020 1003 Last data filed at 12/11/2020 0800 Gross per 24 hour  Intake 5334.44 ml  Output 3750 ml  Net 1584.44 ml   Filed Weights   12/09/20 0500 12/09/20 1427 12/11/20 0341  Weight: 65.5 kg 57.9 kg 58.1 kg    Examination: General: Critically ill appearing man laying in bed in NAD, cachectic. HEENT: [place]/AT, eyes anicteric Neck: trach in place without bleeding Pulmonary: minimal bloody secretions from tracheostomy, CTAB, breathing comfortably on PS 16 Cardiac: RRR, no murmurs Abdomen: ostomies and drain, soft, minimally distended- overall unchanged from previous exams Extremities no c/c/e Neuro RASS -1, tracks. Follows some commands.  CXR 12/29: trach in appropriate position, ongoing b/l LL infiltrates  Resolved Hospital Problem list   SVT  Assessment & Plan:   Sepsis due to intra-abdominal abscess. GPC in blood likely contaminant rather than bacteremia. Worrisome increase in WBC over past few days> it has only gone up since starting on antibiotics. Awaiting sensitivities on several cultures to finalize antibiotic choice. S/p IR drain into intrabdominal abscess -Continue Zosyn, duration to be determined -Continue IV Eraxis -Con't IV vancomycin -trach aspirate culture pending  Acute encephalopathy, multifactorial  Plan -avoid deliriogenic meds -PAD protocol with goal RASS 0 to -1  Acute hypoxic respiratory failure requiring mechanical ventilation  Has been intubated x3 during this hospitalization Pleural effusions,  improved - Secondary to malnutrition, low albumin -Has failed extubation multiple times, significantly more debilitated than for prior attempts - do not anticipate will be able to be successfully extubated Likely bilateral lower lobe pneumonias  Plan  -Trached # 8 [place] on 12/29. Family has been made aware that tracheostomy is not curative and will not likely change his outcome given his severe nutritional issues and frailty. Routine trach care. -con't LTVV; vent pressures appropriate -Prolonged vent wean- SBT daily.  -VAP prevention protocol -PAD protocol for sedation; should only need PRN -con't CPT with hypertonic saline nebs -con't empiric antibiotics- deescalate when able; respiratory culture pending  SBO Ischemic bowel perforation  -s/p 2 ex laps, resection of ischemic bowel, mucus fistula and ileostomy, G tube placement  Intraabdominal abscess s/p IR drain High output ileostomy with bicarb wasting Plan -Continue routine dressing and ostomy care -Family is aware that his nutritional, acid base issues, and chronic hydration challenges make him unlikely to ever be able to live outside of a SNF -supplemental K+ -con't TPN -trickle TF, but not absorbing -stop bicarb infusion today with severely increase bicarbonate level, although this may be erroneous  AKI  Acute metabolic acidosis; likely due to GI bicarb loss & hyperchloremia Serum creatinine holding in the 1.3 range BUN holding in the 50 range Plan -stop bicarb infusion, recheck level in AM -con't to monitor renal function -strict I/Os -Renally dose medications -Avoid nephrotoxins  HTN AFib w/ RVR Has been in sinus  rhythm Plan -Continue telemetry monitoring -Continue low dose metoprolol -Not a candidate for full-dose anticoagulation currently  Severe protein calorie malnutrition Plan -Continue TPN per pharmacy -trickle TF per surgery's recommendations  Hx seizure Has had no witnessed seizure Plan -Continue  Keppra  History of bladder cancer -routine urostomy care  Anemia , no obvious source of ongoing blood loss s/p 1 unit PRBC on 12/24 Hemoglobin has drifted down approximately 1 g posttransfusion Plan -con't to monitor periodically -transfuse for Hb <7 or hemodynamically significant bleeding  Goals of Care -poor baseline due to multiple medical problems, post emergent surgery with slow recovery, failed extubations (12/11, 12/13) due to general deconditioning and inability to clear secretions.  -12/13 Family discussion with CCS and PCCM. Family has decided to pursue all offered aggressive interventions (return OR for abd, trach, full code) -Palliative care consult appreciated for ongoing [place] discussions -Family meeting held again 12/02/20 -- decided they want to proceed with tracheostomy (for more comfort) + ongoing aggressive medical management for now with ongoing [place] discussions -Guarded prognosis given prior debility and ongoing clinical decline despite aggressive care; Appreciate PMT assistance.  Family to consider changing to DNR over Christmas gatherings.   -Updated sister Helene Kelp and daughter [name] on 12/27 -daughter [name] updated 12/28 & 12/29 via phone  Best practice (evaluated daily)  Diet: TPN, trickle TF Pain/Anxiety/Delirium protocol (if indicated): Fent gtt-> prn, precedex VAP protocol (if indicated): Yes  DVT prophylaxis: SQH  GI prophylaxis: PPI Glucose control: SSI  Mobility: BR  Family updated - 12/21, 12/27 Family meeting- last 12/27; ongoing  Code Status: Full Disposition: ICU  This patient is critically ill with multiple organ system failure which requires frequent high complexity decision making, assessment, support, evaluation, and titration of therapies. This was completed through the application of advanced monitoring technologies and extensive interpretation of multiple databases. During this encounter critical care time was devoted to patient care services  described in this note for 33 minutes.  Julian Hy, DO 12/11/20 10:32 AM [facility]

## 2020-12-11 NOTE — Progress Notes (Signed)
Palliative:  No family at bedside. No acute changes. Continues to be critically ill. Overall poor prognosis. Will continue to touch base with family periodically.   Yong Channel, NP Palliative Medicine Team Pager [phone] (Please see [email or web address] for schedule) Team Phone [phone]

## 2020-12-11 NOTE — Progress Notes (Signed)
[facility] Progress Note  15 Days Post-Op  Subjective: Trach placed yesterday.  Objective: Vital signs in last 24 hours: Temp:  [97.5 F (36.4 C)-99.4 F (37.4 C)] 98.1 F (36.7 C) (12/30 0800) Pulse Rate:  [65-112] 112 (12/30 0800) Resp:  [13-39] 29 (12/30 0800) BP: (88-189)/(52-86) 189/64 (12/30 0800) SpO2:  [99 %-100 %] 100 % (12/30 0800) FiO2 (%):  [30 %-40 %] 30 % (12/30 0752) Weight:  [58.1 kg] 58.1 kg (12/30 0341) Last BM Date: 12/11/20  Intake/Output from previous day: 12/29 0701 - 12/30 0700 In: 5756.9 [I.V.:4110.1; NG/GT:1309.7; IV Piggyback:327.1] Out: 4350 [Urine:3200; Drains:15; [id number] Intake/Output this shift: Total I/O In: 187.4 [I.V.:175; IV Piggyback:12.4] Out: -   PE: Gen:  Alert, no distress HEENT: trach in place Card:  RRR Pulm:  On vent Abd: soft, NT, open midline wound with fibrinous exudate and some tissue necrosis but fascia intact/ sutures visible, RUQ ostomy (mucus fistula) dusky and sloughing with scant SS output, LLQ ostomy (end ileostomy) pink and productive of liquid stool, urostomy productive of clear urine, g-tube site c/d with TF running at 20cc/hr, IR drain with purulent fluid in bag Skin: warm and dry  Lab Results:  Recent Labs    12/10/20 0500  WBC 25.4*  HGB 7.1*  HCT 22.9*  PLT 327   BMET Recent Labs    12/10/20 0500 12/11/20 0329  NA 139 139  K 4.0 3.5  CL 103 92*  CO2 26 37*  GLUCOSE 159* 139*  BUN 53* 46*  CREATININE 1.15 1.04  CALCIUM 7.4* 6.9*   PT/INR No results for input(s): LABPROT, INR in the last 72 hours. CMP     Component Value Date/Time   NA 139 12/11/2020 0329   NA 145 03/08/2016 1356   K 3.5 12/11/2020 0329   K 4.5 03/08/2016 1356   CL 92 (L) 12/11/2020 0329   CO2 37 (H) 12/11/2020 0329   CO2 28 03/08/2016 1356   GLUCOSE 139 (H) 12/11/2020 0329   GLUCOSE 98 03/08/2016 1356   BUN 46 (H) 12/11/2020 0329   BUN 14.2 03/08/2016 1356   CREATININE 1.04 12/11/2020 0329    CREATININE 0.9 03/08/2016 1356   CALCIUM 6.9 (L) 12/11/2020 0329   CALCIUM 9.8 03/08/2016 1356   PROT 5.3 (L) 12/11/2020 0329   PROT 8.2 03/08/2016 1356   ALBUMIN 1.3 (L) 12/11/2020 0329   ALBUMIN 3.6 03/08/2016 1356   AST 22 12/11/2020 0329   AST 42 (H) 03/08/2016 1356   ALT 19 12/11/2020 0329   ALT 43 03/08/2016 1356   ALKPHOS 79 12/11/2020 0329   ALKPHOS 79 03/08/2016 1356   BILITOT 0.9 12/11/2020 0329   BILITOT 0.74 03/08/2016 1356   GFRNONAA >60 12/11/2020 0329   GFRAA >60 12/31/2019 0221   Lipase     Component Value Date/Time   LIPASE 57 (H) 12/06/2020 1830       Studies/Results: DG Chest Port 1 View  Result Date: 12/10/2020 CLINICAL DATA:  Status post tracheostomy. EXAM: PORTABLE CHEST 1 VIEW COMPARISON:  Radiograph yesterday. FINDINGS: New tracheostomy tube tip at the thoracic inlet, the upper portion is not included in the field of view. Left subclavian central line remains in place. No significant change in hazy bilateral lung opacities likely combination of atelectasis and effusions. Stable heart size and mediastinal contours. No pneumothorax or pneumomediastinum. No pulmonary edema. Stable osseous structures. IMPRESSION: 1. New tracheostomy tube tip at the thoracic inlet. 2. No significant change in hazy bilateral lung opacities likely combination of  atelectasis and effusions. Electronically Signed   By: Keith Rake M.D.   On: 12/10/2020 15:35   DG CHEST PORT 1 VIEW  Result Date: 12/09/2020 CLINICAL DATA:  Intubated, respiratory failure EXAM: PORTABLE CHEST 1 VIEW COMPARISON:  12/08/2020 FINDINGS: 2 frontal views of the chest demonstrate endotracheal tube overlying tracheal air column tip midway between thoracic inlet and carina. Left-sided central venous catheter tip overlies superior vena cava. Persistent bibasilar veiling opacities unchanged. No pneumothorax. Cardiac silhouette is stable. IMPRESSION: 1. Stable support devices. 2. Persistent bibasilar  consolidation and effusions.  No change. Electronically Signed   By: Randa Ngo M.D.   On: 12/09/2020 19:21    Anti-infectives: Anti-infectives (From admission, onward)   Start     Dose/Rate Route Frequency Ordered Stop   12/08/20 1300  vancomycin (VANCOCIN) IVPB 1000 mg/200 mL premix        1,000 mg 200 mL/hr over 60 Minutes Intravenous Every 24 hours 12/08/20 0845     12/08/20 0100  vancomycin (VANCOREADY) IVPB 500 mg/100 mL  Status:  Discontinued        500 mg 100 mL/hr over 60 Minutes Intravenous Every 12 hours 12/07/20 1130 12/07/20 1337   12/07/20 1215  vancomycin (VANCOREADY) IVPB 1250 mg/250 mL        1,250 mg 166.7 mL/hr over 90 Minutes Intravenous  Once 12/07/20 1124 12/07/20 1336   12/06/20 1030  anidulafungin (ERAXIS) 100 mg in sodium chloride 0.9 % 100 mL IVPB       "Followed by" Linked Group Details   100 mg 78 mL/hr over 100 Minutes Intravenous Every 24 hours 12/05/20 0939     12/05/20 1030  anidulafungin (ERAXIS) 200 mg in sodium chloride 0.9 % 200 mL IVPB       "Followed by" Linked Group Details   200 mg 78 mL/hr over 200 Minutes Intravenous  Once 12/05/20 0939 12/05/20 1618   12/04/20 1500  piperacillin-tazobactam (ZOSYN) IVPB 3.375 g        3.375 g 12.5 mL/hr over 240 Minutes Intravenous Every 8 hours 12/04/20 0818     12/04/20 0915  piperacillin-tazobactam (ZOSYN) IVPB 3.375 g        3.375 g 100 mL/hr over 30 Minutes Intravenous  Once 12/04/20 0818 12/04/20 1207   12/08/2020 0945  cefoTEtan (CEFOTAN) 2 g in sodium chloride 0.9 % 100 mL IVPB  Status:  Discontinued        2 g 200 mL/hr over 30 Minutes Intravenous To Surgery 11/30/2020 0936 11/18/2020 1057   12/09/2020 0900  cefoTEtan (CEFOTAN) 2 g in sodium chloride 0.9 % 100 mL IVPB  Status:  Discontinued        2 g 200 mL/hr over 30 Minutes Intravenous  Once 11/13/2020 0833 12/07/2020 0936   11/19/20 1130  piperacillin-tazobactam (ZOSYN) IVPB 3.375 g        3.375 g 12.5 mL/hr over 240 Minutes Intravenous Every 8 hours  11/19/20 1017 11/27/2020 2359   11/19/20 1115  fluconazole (DIFLUCAN) IVPB 200 mg        200 mg 100 mL/hr over 60 Minutes Intravenous Every 24 hours 11/19/20 1017 11/21/20 1104   11/19/20 0915  Ampicillin-Sulbactam (UNASYN) 3 g in sodium chloride 0.9 % 100 mL IVPB  Status:  Discontinued        3 g 200 mL/hr over 30 Minutes Intravenous Every 12 hours 11/19/20 0826 11/19/20 0949       Assessment/Plan SBO -s/p ex lap with SBR, placement of a gastrostomy tube12/7 by Dr. Georgette Dover.  Takeback 12/12 by Dr. Freida Busman for necrotic SB with resection of additional 60-70cm of SB inclusive of prior anastomosis in discontinuity with open abdomen.  - S/P ex lap, ileostomy, mucous fistula, closure 12/15 by Dr. Janee Morn Malnourishment/FEN- cont TNA. Advance tube feeds to 30 ml/hr. Monitor I/O- high output ostomy. On fiber and imodium.  VDRF- self-extubated 12/13 AM and 12/28, re-intubated, trach placed. Per CCM Shock/ID- shockresolved, off pressors;necrotic bowel was most likely source. WBC highremains high. On Zosyn, vancomycin, eraxis. Drain as below. May also have a pneumonia Intra-abdominal abscess - seen on CT 12/23,s/p perc drain by IR 12/24. Culture with Enterococcus faecalis and Bacteroides fragilis, beta lactamase positive.  CV/AF RVR- in NSRcurrently  VTE-SQH Dispo - ICU. Palliative care and CCM working with family to discuss code status/goals of care, currently plan to pursue aggressive care, partial code/ intubation only.    LOS: 29 days    Fritzi Mandes  MD [facility] 12/11/2020, 9:29 AM Please see Amion for pager number during day hours 7:00am-4:30pm

## 2020-12-11 NOTE — Progress Notes (Signed)
Nutrition Follow-up  DOCUMENTATION CODES:   Severe malnutrition in context of chronic illness  INTERVENTION:   TPN at goal providing 100% of nutrition needs  Tube feeding via G-tube: Osmolite 1.5 at 30 ml/hr  Continue enteral feedings to promote gut adaptation. TPN helping to meet needs during this time.   Advance to goal as able:  Goal: Osmolite 1.5 @ 65 ml/h (1560 ml per day)  Provides 2460 kcal, 130 gm protein, 1185 ml free water daily 200 ml free water every 4 hours  1 packet Nutrisource fiber TID - provides 9 grams soluble fiber per day  NUTRITION DIAGNOSIS:   Severe Malnutrition related to chronic illness as evidenced by severe fat depletion,severe muscle depletion. Ongoing.   GOAL:   Patient will meet greater than or equal to 90% of their needs Meeting with nutrition support   MONITOR:   I & O's  REASON FOR ASSESSMENT:   Consult Enteral/tube feeding initiation and management  ASSESSMENT:   74 year old male with history of cognitive impairment, CVA, HTN, bladder cancer now with urostomy, right-sided glottic carcinoma s/p excision brought to ED with nausea, vomiting and abdominal pain.  Work-up in ED revealed small bowel obstruction.  He also had AKI and lactic acidosis.  Started on bowel rest with NG tube decompression and IV fluid.  General surgery following.  Subsequent x-rays with persistent SBO.  Pt discussed during ICU rounds and with RN.  Palliative care meeting 12/21 plan for full scope of care  Ileostomy output down; increasing TF  12/7 s/p ex lap, extensive LOA, SBR, g-tube placement; s/p bronch  12/10 TPN advanced to goal rate - provides: 2120 kcal and 121 grams protein  12/12 back to OR for re-exploration and washout with new copious drainage from incision; pt s/p reopening of recent laparotomy, SBR without reanastomosis, abd washout, placement of VAC 12/15 s/p ex lap, ileostomy, mucous fistula, and closure 12/18 TPN d/c'ed; TF at goal  rate 12/22 TPN initiated to support patient due to short bowel  12/23 opium added  12/24 rt pelvic abscess drain placed 12/26- TF re-started (trickle) 12/29 s/p trach  12/30 TF increased to 30 ml  Patient is currently on ventilator support via trach.   Medications reviewed and include: SSI, imodium 4 mg every 8 hours, opium 4 drops every 6 hours   Labs reviewed:  CBG's: 139-154  I&O's:  Pt is negative 4 L since admission  18 F G-tube --> infusing TF  Ileostomy: 1250 ml --> 725 ml  Mucous fistula: 410 ml   UOP: 3200 ml  R drain: 15 ml   Current TPN @ 100 ml/hr - provides: 2380 kcal and 120 grams protein  Diet Order:   Diet Order            Diet NPO time specified  Diet effective midnight                 EDUCATION NEEDS:   No education needs have been identified at this time  Skin:  Skin Assessment: Skin Integrity Issues: Skin Integrity Issues:: Stage II Stage II: coccyx Incisions: closed abdomen  Last BM:  725 ml via ileostomy  Height:   Ht Readings from Last 1 Encounters:  11/22/20 5\' 11"  (1.803 m)    Weight:   Wt Readings from Last 1 Encounters:  12/11/20 58.1 kg    Ideal Body Weight:  78.2 kg  BMI:  Body mass index is 17.86 kg/m.  Estimated Nutritional Needs:   Kcal:  2300-2700  Protein:  120-135 grams  Fluid:  > 2 L  Imaad Reuss P., RD, LDN, CNSC See AMiON for contact information

## 2020-12-11 NOTE — Progress Notes (Addendum)
  Speech Language Pathology -Cancel note Patient Details Name: Ronald Wolf MRN: [id number] DOB: 1946-02-23 Today's Date: 12/11/2020 Time:  -    Order received for PMV/Swallow. Trach'd yesterday; on full vent support. Will defer evaluations this date.                 GO                [name] 12/11/2020, 11:48 AM Breck Coons Lonell Face.Ed Nurse, children's [phone] Office [phone]

## 2020-12-12 DIAGNOSIS — N179 Acute kidney failure, unspecified: Secondary | ICD-10-CM | POA: Diagnosis not present

## 2020-12-12 DIAGNOSIS — Z515 Encounter for palliative care: Secondary | ICD-10-CM | POA: Diagnosis not present

## 2020-12-12 DIAGNOSIS — J9601 Acute respiratory failure with hypoxia: Secondary | ICD-10-CM | POA: Diagnosis not present

## 2020-12-12 DIAGNOSIS — Z7189 Other specified counseling: Secondary | ICD-10-CM | POA: Diagnosis not present

## 2020-12-12 DIAGNOSIS — L8990 Pressure ulcer of unspecified site, unspecified stage: Secondary | ICD-10-CM | POA: Insufficient documentation

## 2020-12-12 DIAGNOSIS — R627 Adult failure to thrive: Secondary | ICD-10-CM | POA: Diagnosis not present

## 2020-12-12 LAB — GLUCOSE, CAPILLARY
Glucose-Capillary: 141 mg/dL — ABNORMAL HIGH (ref 70–99)
Glucose-Capillary: 145 mg/dL — ABNORMAL HIGH (ref 70–99)
Glucose-Capillary: 126 mg/dL — ABNORMAL HIGH (ref 70–99)

## 2020-12-12 LAB — BASIC METABOLIC PANEL
Anion gap: 9 (ref 5–15)
BUN: 47 mg/dL — ABNORMAL HIGH (ref 8–23)
CO2: 25 mmol/L (ref 22–32)
Calcium: 7.7 mg/dL — ABNORMAL LOW (ref 8.9–10.3)
Chloride: 100 mmol/L (ref 98–111)
Creatinine, Ser: 1.17 mg/dL (ref 0.61–1.24)
GFR, Estimated: >60 mL/min (ref >60)
Glucose, Bld: 153 mg/dL — ABNORMAL HIGH (ref 70–99)
Potassium: 4.4 mmol/L (ref 3.5–5.1)
Sodium: 134 mmol/L — ABNORMAL LOW (ref 135–145)

## 2020-12-12 LAB — MAGNESIUM: Magnesium: 2.1 mg/dL (ref 1.7–2.4)

## 2020-12-12 LAB — PHOSPHORUS: Phosphorus: 2.9 mg/dL (ref 2.5–4.6)

## 2020-12-12 LAB — AEROBIC/ANAEROBIC CULTURE W GRAM STAIN (SURGICAL/DEEP WOUND)

## 2020-12-12 MED ORDER — SODIUM CHLORIDE 0.9 % IV SOLN
3.0000 g | Freq: Four times a day (QID) | INTRAVENOUS | Status: DC
  Administered 2020-12-12 – 2020-12-21 (×35): 3 g via INTRAVENOUS
  Filled 2020-12-12 (×4): qty 3
  Filled 2020-12-12 (×5): qty 8
  Filled 2020-12-12 (×10): qty 3
  Filled 2020-12-12: qty 8
  Filled 2020-12-12: qty 3
  Filled 2020-12-12 (×2): qty 8
  Filled 2020-12-12 (×6): qty 3
  Filled 2020-12-12 (×4): qty 8
  Filled 2020-12-12 (×4): qty 3

## 2020-12-12 MED ORDER — TRAVASOL 10 % IV SOLN
INTRAVENOUS | Status: AC
  Filled 2020-12-12: qty 1200

## 2020-12-12 NOTE — Progress Notes (Signed)
Palliative:  HPI: 74 yo male with past medical history significant of bladder cancer s/p urostomy, seizure disorder, right glottic cancer s/p excisinon, stroke, vascular dementia, atrial fibrillation, tobacco abuse, hep C, anxiety, depression admitted 11/19/2020 with small bowel obstruction and ischemic bowel. Initially seen at [facility] but left AMA and came to [facility] following day. S/P exploratory laparoscopy and bowel resection 12/7, further bowel resection 12/12 and found to have bowel necrosis, and abd closure and ileostomy 12/15. Hospitalization complicated by recurrent respiratory failure and intubated x 3.Overall adult failure to thrive.  I met today at [place] bedside but no family present. He awakens and tracks but unable to follow commands or respond to me at all. I attempted to get him to nod his head yes/no without success. After a few minutes he just closed his eyes.   I called and spoke with [place]. She shares that she has had poor response from his as well. She feels that he just "looks disgusted" and is not responding any further to her. We discussed his frailty and ongoing issues with gut absorption, sacral wound, and need for vent support. We discussed concern for his lack of improvement and high risk for further infection, skin breakdown, and further decline. We discussed that continuing this aggressive treatment is still the right thing to do for [facility]. I encouraged [name] to continue discussing with her family if this is the right thing to do and to consider how we should handle if he does decline or have more complications. She understands and reports that they will continue family discussions.   All questions/concerns addressed. Emotional support provided. Updated Dr. Lake Bells.   Exam: Alert, tracking. Does not nod head or follow any commands. Thin, frail, cachectic. ETT to vent. No distress. Abd dressing and ostomy bags in place and clean, dry.   Plan: - Ongoing goals of  care conversations needed.  - No change to goals at this time.   25 min  Vinie Sill, NP Palliative Medicine Team Pager [phone] (Please see [email or web address] for schedule) Team Phone [phone]    Greater than 50%  of this time was spent counseling and coordinating care related to the above assessment and plan

## 2020-12-12 NOTE — Progress Notes (Signed)
[facility] Progress Note  16 Days Post-Op  Subjective: No acute events.  Having bowel function.    Objective: Vital signs in last 24 hours: Temp:  [99.4 F (37.4 C)-100.7 F (38.2 C)] 99.4 F (37.4 C) (12/31 0800) Pulse Rate:  [90-107] 90 (12/31 1000) Resp:  [15-33] 15 (12/31 1000) BP: (140-175)/(61-87) 160/74 (12/31 1000) SpO2:  [100 %] 100 % (12/31 1115) FiO2 (%):  [30 %] 30 % (12/31 1115) Weight:  [65.9 kg] 65.9 kg (12/31 0500) Last BM Date: 12/11/20  Intake/Output from previous day: 12/30 0701 - 12/31 0700 In: 5007.1 [I.V.:2664; NG/GT:1860; IV Piggyback:483.1] Out: 4705 [Urine:3300; Drains:80; Stool:1325] Intake/Output this shift: Total I/O In: 356.7 [I.V.:295.7; IV Piggyback:61] Out: 0   PE: Gen:  Alert, no distress HEENT: trach in place Card:  RRR Pulm:  On vent Abd: soft, NT, open midline wound with fibrinous exudate and some tissue necrosis but fascia intact/ sutures visible, RUQ ostomy (mucus fistula) dusky and sloughing with scant SS output, LLQ ostomy (end ileostomy) pink and productive of liquid stool, urostomy productive of clear urine, g-tube site c/d with TF running at 30cc/hr, IR drain with purulent fluid in bag Skin: warm and dry  Lab Results:  Recent Labs    12/10/20 0500  WBC 25.4*  HGB 7.1*  HCT 22.9*  PLT 327   BMET Recent Labs    12/10/20 0500 12/11/20 0329  NA 139 139  K 4.0 3.5  CL 103 92*  CO2 26 37*  GLUCOSE 159* 139*  BUN 53* 46*  CREATININE 1.15 1.04  CALCIUM 7.4* 6.9*   PT/INR No results for input(s): LABPROT, INR in the last 72 hours. CMP     Component Value Date/Time   NA 139 12/11/2020 0329   NA 145 03/08/2016 1356   K 3.5 12/11/2020 0329   K 4.5 03/08/2016 1356   CL 92 (L) 12/11/2020 0329   CO2 37 (H) 12/11/2020 0329   CO2 28 03/08/2016 1356   GLUCOSE 139 (H) 12/11/2020 0329   GLUCOSE 98 03/08/2016 1356   BUN 46 (H) 12/11/2020 0329   BUN 14.2 03/08/2016 1356   CREATININE 1.04 12/11/2020 0329    CREATININE 0.9 03/08/2016 1356   CALCIUM 6.9 (L) 12/11/2020 0329   CALCIUM 9.8 03/08/2016 1356   PROT 5.3 (L) 12/11/2020 0329   PROT 8.2 03/08/2016 1356   ALBUMIN 1.3 (L) 12/11/2020 0329   ALBUMIN 3.6 03/08/2016 1356   AST 22 12/11/2020 0329   AST 42 (H) 03/08/2016 1356   ALT 19 12/11/2020 0329   ALT 43 03/08/2016 1356   ALKPHOS 79 12/11/2020 0329   ALKPHOS 79 03/08/2016 1356   BILITOT 0.9 12/11/2020 0329   BILITOT 0.74 03/08/2016 1356   GFRNONAA >60 12/11/2020 0329   GFRAA >60 12/31/2019 0221   Lipase     Component Value Date/Time   LIPASE 57 (H) 12/08/2020 1830       Studies/Results: DG Chest Port 1 View  Result Date: 12/10/2020 CLINICAL DATA:  Status post tracheostomy. EXAM: PORTABLE CHEST 1 VIEW COMPARISON:  Radiograph yesterday. FINDINGS: New tracheostomy tube tip at the thoracic inlet, the upper portion is not included in the field of view. Left subclavian central line remains in place. No significant change in hazy bilateral lung opacities likely combination of atelectasis and effusions. Stable heart size and mediastinal contours. No pneumothorax or pneumomediastinum. No pulmonary edema. Stable osseous structures. IMPRESSION: 1. New tracheostomy tube tip at the thoracic inlet. 2. No significant change in hazy bilateral lung  opacities likely combination of atelectasis and effusions. Electronically Signed   By: Keith Rake M.D.   On: 12/10/2020 15:35    Anti-infectives: Anti-infectives (From admission, onward)   Start     Dose/Rate Route Frequency Ordered Stop   12/12/20 1800  Ampicillin-Sulbactam (UNASYN) 3 g in sodium chloride 0.9 % 100 mL IVPB        3 g 200 mL/hr over 30 Minutes Intravenous Every 6 hours 12/12/20 1101     12/08/20 1300  vancomycin (VANCOCIN) IVPB 1000 mg/200 mL premix  Status:  Discontinued        1,000 mg 200 mL/hr over 60 Minutes Intravenous Every 24 hours 12/08/20 0845 12/12/20 1059   12/08/20 0100  vancomycin (VANCOREADY) IVPB 500 mg/100  mL  Status:  Discontinued        500 mg 100 mL/hr over 60 Minutes Intravenous Every 12 hours 12/07/20 1130 12/07/20 1337   12/07/20 1215  vancomycin (VANCOREADY) IVPB 1250 mg/250 mL        1,250 mg 166.7 mL/hr over 90 Minutes Intravenous  Once 12/07/20 1124 12/07/20 1336   12/06/20 1030  anidulafungin (ERAXIS) 100 mg in sodium chloride 0.9 % 100 mL IVPB       "Followed by" Linked Group Details   100 mg 78 mL/hr over 100 Minutes Intravenous Every 24 hours 12/05/20 0939     12/05/20 1030  anidulafungin (ERAXIS) 200 mg in sodium chloride 0.9 % 200 mL IVPB       "Followed by" Linked Group Details   200 mg 78 mL/hr over 200 Minutes Intravenous  Once 12/05/20 0939 12/05/20 1618   12/04/20 1500  piperacillin-tazobactam (ZOSYN) IVPB 3.375 g  Status:  Discontinued        3.375 g 12.5 mL/hr over 240 Minutes Intravenous Every 8 hours 12/04/20 0818 12/12/20 1059   12/04/20 0915  piperacillin-tazobactam (ZOSYN) IVPB 3.375 g        3.375 g 100 mL/hr over 30 Minutes Intravenous  Once 12/04/20 0818 12/04/20 1207   12/10/2020 0945  cefoTEtan (CEFOTAN) 2 g in sodium chloride 0.9 % 100 mL IVPB  Status:  Discontinued        2 g 200 mL/hr over 30 Minutes Intravenous To Surgery 11/25/2020 0936 11/20/2020 1057   11/15/2020 0900  cefoTEtan (CEFOTAN) 2 g in sodium chloride 0.9 % 100 mL IVPB  Status:  Discontinued        2 g 200 mL/hr over 30 Minutes Intravenous  Once 12/04/2020 0833 12/10/2020 0936   11/19/20 1130  piperacillin-tazobactam (ZOSYN) IVPB 3.375 g        3.375 g 12.5 mL/hr over 240 Minutes Intravenous Every 8 hours 11/19/20 1017 12/02/2020 2359   11/19/20 1115  fluconazole (DIFLUCAN) IVPB 200 mg        200 mg 100 mL/hr over 60 Minutes Intravenous Every 24 hours 11/19/20 1017 11/21/20 1104   11/19/20 0915  Ampicillin-Sulbactam (UNASYN) 3 g in sodium chloride 0.9 % 100 mL IVPB  Status:  Discontinued        3 g 200 mL/hr over 30 Minutes Intravenous Every 12 hours 11/19/20 0826 11/19/20 0949        Assessment/Plan SBO -s/p ex lap with SBR, placement of a gastrostomy tube12/7 by Dr. Georgette Dover. Takeback 12/12 by Dr. Zenia Resides for necrotic SB with resection of additional 60-70cm of SB inclusive of prior anastomosis in discontinuity with open abdomen.  - S/P ex lap, ileostomy, mucous fistula, closure 12/15 by Dr. Grandville Silos Malnourishment/FEN- cont TNA.  Advance tube  feeds to goal today.  Monitor I/O- high output ostomy. On fiber and imodium.  VDRF- self-extubated 12/13 AM and 12/28, re-intubated, trach placed. Per CCM Shock/ID- shockresolved, off pressors;necrotic bowel was most likely source. WBC highremains high. On Zosyn, vancomycin, eraxis. Drain as below. May also have a pneumonia Intra-abdominal abscess - seen on CT 12/23,s/p perc drain by IR 12/24. Culture with Enterococcus faecalis and Bacteroides fragilis, beta lactamase positive.  CV/AF RVR- in NSRcurrently  VTE-SQH Dispo - ICU. Palliative care and CCM working with family to discuss code status/goals of care, currently plan to pursue aggressive care, partial code/ intubation only.    LOS: 30 days    [facility] 12/12/2020, 11:36 AM Please see Amion for pager number during day hours 7:00am-4:30pm

## 2020-12-12 NOTE — Progress Notes (Signed)
NAME:  EUSEVIO SCHRIVER, MRN:  [id number], DOB:  1946-12-13, LOS: 30 ADMISSION DATE:  November 13, 2020, CONSULTATION DATE:  11/27/2020 REFERRING MD: Alanda Slim CHIEF COMPLAINT:  hypotension   Brief History   Mr.Jernigan is a 74 yo M w/ PMH of CVA, Seizures, Dementia, Bladder ca s/p urostomy, tobacco use, right glottic carcinoma s/p excision presenting with sbo. Found to have ischemic bowel requiring ex-lap 12/7.  Course complicated by septic shock, acute respiratory failure and AKI return to the OR 12/12 for necrotic small bowel resection of additional 70 cm inclusive of prior anastomosis 12/15 abdominal closure with ileostomy  History of present illness   Mr.Bi is a 74 yo M w/ above PMH who initially presented to [facility] hospital on 11/11/20 with abdominal pain and nausea of 4-5 day duration. He was found to have SBO but left against medical advice as his family did not feel comfortable with him receiving his medical care in [place]. He came to [facility] ED the next day with similar complaint and was found to have SBO. He was treated with supportive care with bowel rest and ng suction. However his symptoms did not improve. Surgery team was consulted and he was brough to OR for exploratory laparoscopy. He was found to have multiple sections of ischemic bowel which were resected. He was unable to be extubated post-operatively and PCCM was consulted.  Past Medical History  CVA, Dementia, Bladder CA s/p nephrostomy, tobacco use, right glottic carcinoma s/p excision, seizures.  Significant Hospital Events   11/13/2020 Admission 11/19/2020 OR 11/13/2020 Admit to ICU 12/10 Brief SVT with associated hypertension. Self limited Given fentanyl, metop and hydral for HTN 12/11 Good urine output with Lasix , failed extubation, reintubated within 2 hours due to inability to clear secretions 12/12 return OR for enteric leak-- found to have bowel necrosis. Left in discontinuity  12/13 self extubated, reintubated. On  pressors. Family meeting with PCCM and CCS to discuss possible next steps.  12/15-was back in OR, abdominal closure, RUQ mucus fistula and L ileostomy  12/19: No significant overnight events, high ileostomy output 12/21: scheduled for family meeting. Worse hypernatremia, continued high ileostomy output.  Attempted PSV/CPAP wean, pulled volumes of high 100s -low/mid 200s with tachypnea. Placed back of full support.  12/22 ongoing high output GI/ ileostomy, TPN started; weaned 12/5 all day; off fentanyl gtt, remains on precedex  12/24 IR drain , 1 unit PRBC transfusion for hemoglobin 6.5 12/26 added vancomycin for GPC in blood cultures and RIC suggesting MRSA Consults:  General Surgery PCCM  Procedures:  11/30/2020 Ex-lap, G-tube placement ETT 12/7 >> 12/11, >> 12/15- back to OR for abdominal closure, exploratory laparotomy  12/24 CT-guided right pelvic abscess drain 12/29 trach Significant Diagnostic Tests:   CT abd/pelvis 12/2 > High-grade distal small bowel obstruction, possibly due to adhesion. Mild ascites and diffuse mesenteric edema. No evidence of focal inflammatory process or abscess. Small bilateral pleural effusions and mild right lower lobe Atelectasis. CT abdomen/pelvis 12/23 Interval development of a large loculated collection in the low anterior abdomen and pelvis measuring approximately 6.1 x 11.3 x12.1 cm in size subjacent to the operative site. Few foci of internal gas are present. , no frank spillage of enteric contrast   Micro Data:  11/13/20 COVID/Flu negative 12/7 BA: Few GNR rare GPC -- predominantly PMN  12/7 BCx> Neg 12/11 respiratory -no organism in tracheal aspirate 12/24 BC >> RIC showing both staph epidermis 1/4 12/24 abscess >> E. Faecalis, B fragilis (B lactamase) 12/27 trach  aspirate>> normal flora  Antimicrobials:  Fluc 12/8>> 12/10 Zosyn 12/8> 12/12 cefotetain 12/15>> off Zosyn 12/23 >> eraxis 12/24 >> Vancomycin 12/26 Interim history/subjective:   Currently sedated poorly responsive  Objective   Blood pressure (!) 145/66, pulse 100, temperature 99.4 F (37.4 C), temperature source Axillary, resp. rate (!) 22, height 5\' 11"  (1.803 m), weight 65.9 kg, SpO2 100 %.    Vent Mode: PRVC FiO2 (%):  [30 %] 30 % Set Rate:  [14 bmp] 14 bmp Vt Set:  [600 mL] 600 mL PEEP:  [5 cmH20] 5 cmH20 Plateau Pressure:  [18 [id number] cmH20] 18 cmH20   Intake/Output Summary (Last 24 hours) at 12/12/2020 0925 Last data filed at 12/12/2020 0700 Gross per 24 hour  Intake 4799.68 ml  Output 4705 ml  Net 94.68 ml   Filed Weights   12/09/20 1427 12/11/20 0341 12/12/20 0500  Weight: 57.9 kg 58.1 kg 65.9 kg    Examination: General: Frail elderly male sedated on vent HEENT: Tracheostomy in place Neuro: Currently sedated not responding to verbal commands CV: Heart sounds are regular PULM: Decreased breath sounds throughout Vent pressure support for FIO2 18 PEEP 30% RATE of spontaneous VT spontaneous  GI: Multiple drains multiple ostomies no bowel sounds appreciated GU: Urostomy is in place Extremities: warm/dry, 1+ edema  Skin: no rashes or lesions   CXR 12/29: trach in appropriate position, ongoing b/l LL infiltrates No chest x-ray 12/12/2020 Resolved Hospital Problem list   SVT  Assessment & Plan:   Sepsis due to intra-abdominal abscess. GPC in blood likely contaminant rather than bacteremia. Worrisome increase in WBC over past few days> it has only gone up since starting on antibiotics. Awaiting sensitivities on several cultures to finalize antibiotic choice. S/p IR drain into intrabdominal abscess Continue antimicrobial therapy    Acute encephalopathy, multifactorial  Plan Minimize sedation  Acute hypoxic respiratory failure requiring mechanical ventilation  Has been intubated x3 during this hospitalization Pleural effusions, improved - Secondary to malnutrition, low albumin -Has failed extubation multiple times, significantly  more debilitated than for prior attempts - do not anticipate will be able to be successfully extubated Likely bilateral lower lobe pneumonias  Plan  Trached with a #8 [id number], family 1 is not curative Wean from vent as tolerated Continue antimicrobial therapy negative sputum culture 1 of 2 blood cultures staph epidermidis methicillin-resistant    SBO Ischemic bowel perforation  -s/p 2 ex laps, resection of ischemic bowel, mucus fistula and ileostomy, G tube placement  Intraabdominal abscess s/p IR drain High output ileostomy with bicarb wasting Plan Routine care per surgery Family is updated to the fact weanable most likely not male survive outside a skilled nursing facility Currently on TPN Trickle tube feeds not being absorbed    AKI  Acute metabolic acidosis; likely due to GI bicarb loss & hyperchloremia Lab Results  Component Value Date   CREATININE 1.04 12/11/2020   CREATININE 1.15 12/10/2020   CREATININE 1.43 (H) 12/09/2020   CREATININE 0.9 03/08/2016   CREATININE 1.00 06/28/2014   CREATININE 0.90 04/05/2013   CREATININE 1.01 06/12/2012   Recent Labs  Lab 12/09/20 0415 12/10/20 0500 12/11/20 0329  K 3.6 4.0 3.5     Serum creatinine holding in the 1.3 range BUN holding in the 50 range Plan Off bicarb Monitor renal function Avoid nephrotoxins   HTN AFib w/ RVR Has been in sinus rhythm Plan Low-dose Metroprolol Not a candidate for anticoagulation  Severe protein calorie malnutrition Plan Currently on TPN per pharmacy Tube feedings  per surgery  Hx seizure Has had no witnessed seizure Plan Continue Keppra  History of bladder cancer Continue urostomy care  Anemia , no obvious source of ongoing blood loss Recent Labs    12/10/20 0500  HGB 7.1*    s/p 1 unit PRBC on 12/24 Hemoglobin has drifted down approximately 1 g posttransfusion Plan Transfuse per protocol  Goals of Care -poor baseline due to multiple medical problems, post  emergent surgery with slow recovery, failed extubations (12/11, 12/13) due to general deconditioning and inability to clear secretions.  -12/13 Family discussion with CCS and PCCM. Family has decided to pursue all offered aggressive interventions (return OR for abd, trach, full code) -Palliative care consult appreciated for ongoing [place] discussions -Family meeting held again 12/02/20 -- decided they want to proceed with tracheostomy (for more comfort) + ongoing aggressive medical management for now with ongoing [place] discussions -Guarded prognosis given prior debility and ongoing clinical decline despite aggressive care; Appreciate PMT assistance.  Family to consider changing to DNR over Christmas gatherings.   -Updated sister Helene Kelp and daughter [name] on 12/27 -daughter [name] updated 12/28 & 12/29 via phone 12/09/2020 Be changed to partial code no shock no CPR all other measures continued.  Best practice (evaluated daily)  Diet: TPN, trickle TF Pain/Anxiety/Delirium protocol (if indicated): Fent gtt-> prn, precedex VAP protocol (if indicated): Yes  DVT prophylaxis: SQH  GI prophylaxis: PPI Glucose control: SSI  Mobility: BR  Family updated - 12/21, 12/27 Family meeting- last 12/27; ongoing  Code Status: Full Disposition: ICU  App cct 25 min  Richardson Landry Karandeep Resende ACNP Acute Care Nurse Practitioner [place] Please consult [place] 12/12/2020, 9:25 AM

## 2020-12-12 NOTE — Progress Notes (Signed)
PHARMACY - TOTAL PARENTERAL NUTRITION CONSULT NOTE  Indication: Small bowel obstruction, prolonged ileus  Patient Measurements: Height: 5\' 11"  (180.3 cm) Weight: 61.2 kg (134 lb 14.7 oz) IBW/kg (Calculated) : 75.3 TPN AdjBW (KG): 60.8 Body mass index is 18.82 kg/m. Usual Weight: 137 lbs  Assessment:  Ronald Wolf presented 12/09/2020 with nausea, vomiting, abdominal pain after leaving AMA at [facility] with known SBO. Patient has been on bowel rest with NG tube decompression. Patient continued to have persistent SBO on imaging with no evidence of improvement, now s/p ex-lap with LoA, SBR and placement of Gtube on 12-17-2020. Pharmacy consulted for TPN management - patient received TPN 11/19/20 through 11/29/20.  Reconsulted to resume TPN on 12/21 despite tube feeds at goal rate due to high ostomy output and concern for tube feeds not absorbing. Patient on loperamide and fiber without improvement in output.   Glucose / Insulin: no hx DM - CBGs <180 on TPN - utilized 8 units SSI/24hrs Electrolytes: K 4>3.5, Na 139 (12/30), Phos 2.9, Mg 2.1 (12/31), Bicarb 26>37 (bicarb gtt stopped) back to Cl:Ac 1:1 On FW 11-24-1980 q4h  Renal: Scr 1.43>1.04, BUN 53>46 (12/30) LFTs / TGs: LFTs and TG wnl, Tbili 1.6>0.9 Prealbumin / albumin: Prealbumin down 7.7 / albumin 1.6>1.3 Intake / Output; MIVF: UOP 2.3>2.1 ml/kg/hr, high ileostomy output 1660>1357ml (on fiber, opium tincture, loperamide and unlikely to improve per Surgery), drain O/P 28ml, net -4.6L GI Imaging:  12/11 DG Abd - stomach gas-filled, without distention, no obvious free air 12/23 CT abd pelvis - large loculated collection in low abd/pelvis, few foci of internal gas present, new RUQ ileostomy, thickened distal SB/colon, subtotal atelectatic collapse of LLL, partial collapse of RLL, small R pleural effusion, body wall edema Surgeries / Procedures: 12/12: re-exploration and washout with new copious drainage from incision concerning for enteric leak vs.  enterotomy >> now s/p, bowel necrosis resected 12/15: ex-lap with ileostomy with fistula creation, abdominal closure 12/24 Pelvic abscess drain 12/29 - trach placed  Central access: PICC placed 12-17-20 TPN start date: 11/19/20>>11/29/20; restart 12/03/20 >>  Nutritional Goals (updated per RD rec 12/21): 2300-2700 kCal, 120-135g protein, >2L fluid per day  Goal TPN rate of 100 cc/hr will provide 120g AA, 432g CHO, and 36g lipids (15% of total kCal due to 1/22) for a total of 2308 kcal, meeting 100% of needs  Current Nutrition:  TPN Osmolite 1.5 at 20>30 ml/hr (plans for advancement to goal today) ProSource TF 45 ml TID (each 11g AA and 40 kcal)  Plan:  Continue TPN at 100 ml/hr at 1800  TPN will provide 120g protein, 432g CHO, 36g SMOF lipids (~15% of total kcal due to [employer]), and 2308 kcal meeting 100% of estimated needs BMP not resulted in time for TPN order deadline, no sig change in UOP, will continue same lytes and replace outside TPN as needed Electrolytes in TPN: Na 20 mEq/L, K 80mEq/L,Phos 12mEq/L,  Cl/:Acetate to 1:1 Add standard MVI and trace elements + folic acid   Continue moderate SSI to q8h and adjust as needed BMP, Mg, Phos in AM F/u TF tolerability with rate increase, and ability to wean TPN    Thank you for involving pharmacy in this patient's care.  12m, PharmD Clinical Pharmacist Please check AMION for all [facility] Pharm[facility] numbers 12/12/2020 7:09 AM

## 2020-12-12 NOTE — Evaluation (Signed)
Occupational Therapy Evaluation Patient Details Name: Ronald Wolf MRN: [id number] DOB: 1946/07/23 Today's Date: 12/12/2020    History of Present Illness Pt is a 74 y.o. male admitted 12/10/2020 with abdominal pain and nausea; workup revealed SBO, s/p NGT placement. Also with AKI. Pt is now s/p multiple ex lap with revisions and placement of G-tube. Pt self-extubated 3 times, is now s/p placement of trach on 12/29. PMH includes stroke, HTN, bladder CA s/p urostomy, seizure, Hep C, anxiety, depression.   Clinical Impression   PT admitted with multiple ex lab revisions complicated medical course now trach/ peg. Pt currently with functional limitiations due to the deficits listed below (see OT problem list). Pt lethargic and arousing for focused attention this session. Pt responding to talking R ear closely with full head turn. Pt responding to any tactile input to the sole of feet with visual attention.Pt with edema bil hands noted.  Pt will benefit from skilled OT to increase their independence and safety with adls and balance to allow discharge LTACH (pending progression of respiratory status if off vent recommendation SNF).     Follow Up Recommendations  LTACH;Supervision/Assistance - 24 hour    Equipment Recommendations  Wheelchair (measurements OT);Wheelchair cushion (measurements OT);Hospital bed;Other (comment) (lift)    Recommendations for Other Services       Precautions / Restrictions Precautions Precautions: Fall Precaution Comments: trach, PEG, urostomy, colostomy, gastrostomy, open abdomen, drain R lower abdomen Required Braces or Orthoses: Other Brace (bil mitten and wrist restraints)      Mobility Bed Mobility Overal bed mobility: Needs Assistance Bed Mobility: Rolling Rolling: Total assist         General bed mobility comments: used bed to help with increase of trunk at this time due to lack of following commands    Transfers                 General  transfer comment: defer to next session    Balance                                           ADL either performed or assessed with clinical judgement   ADL Overall ADL's : Needs assistance/impaired Eating/Feeding: NPO                                     General ADL Comments: total (A) for all adls     Vision   Additional Comments: pt following therapist movement when babinski applied to both LE. pt looking to the R and the L. OT calling patient name very close to his ear and turning head completely to the R in response     Perception     Praxis      Pertinent Vitals/Pain Pain Assessment: Faces Faces Pain Scale: No hurt     Hand Dominance  (unknown)   Extremity/Trunk Assessment Upper Extremity Assessment RUE Deficits / Details: flaccid no response to painful stimuli, retrograde massage provided, clonus + at wrist LUE Deficits / Details: localized to noxious stimuli, automatic grasp with pressure to palm, reaching in response to pressure on sole of feed toward face instead of feed   Lower Extremity Assessment RLE Deficits / Details: response to babinski with L UE and moving foot away LLE Deficits / Details: reponse to babinski with moving foot  away   Cervical / Trunk Assessment Cervical / Trunk Assessment: Other exceptions Cervical / Trunk Exceptions: requires support of pillow/ abdominal drains / wounds   Communication Communication Communication: Tracheostomy   Cognition Arousal/Alertness: Lethargic Behavior During Therapy: Flat affect Overall Cognitive Status: No family/caregiver present to determine baseline cognitive functioning                                     General Comments  edema bil UE noted with retrograde massage provided. LUE with AAROM provided to help with edema shoulder elbow wrist hand  VSS vent stable    Exercises Other Exercises Other Exercises: PROM R UE shoulder elbow wrist digits due to  edema. tone noted shoulder and elbow, pt with clonus (+) with wrist movement   Shoulder Instructions      Home Living Family/patient expects to be discharged to:: Unsure                                        Prior Functioning/Environment                   OT Problem List: Decreased strength;Decreased range of motion;Decreased activity tolerance;Impaired balance (sitting and/or standing);Impaired vision/perception;Decreased coordination;Decreased cognition;Decreased safety awareness;Decreased knowledge of use of DME or AE;Decreased knowledge of precautions;Impaired UE functional use;Impaired sensation      OT Treatment/Interventions: Self-care/ADL training;Therapeutic exercise;Neuromuscular education;Energy conservation;DME and/or AE instruction;Manual therapy;Modalities;Splinting;Therapeutic activities;Cognitive remediation/compensation;Patient/family education;Balance training    OT Goals(Current goals can be found in the care plan section) Acute Rehab OT Goals Patient Stated Goal: none stated OT Goal Formulation: Patient unable to participate in goal setting Time For Goal Achievement: 12/26/20 Potential to Achieve Goals: Fair  OT Frequency: Min 2X/week   Barriers to D/C:            Co-evaluation              AM-PAC OT "6 Clicks" Daily Activity     Outcome Measure Help from another person eating meals?: Total Help from another person taking care of personal grooming?: Total Help from another person toileting, which includes using toliet, bedpan, or urinal?: Total Help from another person bathing (including washing, rinsing, drying)?: Total Help from another person to put on and taking off regular upper body clothing?: Total Help from another person to put on and taking off regular lower body clothing?: Total 6 Click Score: 6   End of Session Equipment Utilized During Treatment: Oxygen Nurse Communication: Mobility status  Activity Tolerance:  Patient tolerated treatment well Patient left: in bed;with call bell/phone within reach;with restraints reapplied  OT Visit Diagnosis: Unsteadiness on feet (R26.81);Muscle weakness (generalized) (M62.81)                Time: [id number] OT Time Calculation (min): 12 min Charges:  OT General Charges $OT Visit: 1 Visit OT Evaluation $OT Eval Moderate Complexity: 1 Mod   Brynn, OTR/L  Acute Rehabilitation Services Pager: [phone] Office: [phone] .   Jeri Modena 12/12/2020, 4:06 PM

## 2020-12-12 NOTE — Progress Notes (Signed)
RT NOTE: RTx2 worked with speech therapy on in-line PMSV. RT first placed patient back on FS and deflated cuff slowly and patient tolerated well. Speech placed the in-line PMSV on after RT placed peep to 0. Patient tolerated well throughout with no complicaitons. Once done RT placed patients peep back to 5, inflated cuff fully and placed patient back on CPAP/PSV 15/5. RT will continue to monitor.

## 2020-12-12 NOTE — Evaluation (Signed)
[facility] Speaking Valve - Evaluation Patient Details  Name: Ronald Wolf MRN: [id number] Date of Birth: 03/09/1946  Today's Date: 12/12/2020 Time: [id number] SLP Time Calculation (min) (ACUTE ONLY): 13 min  Past Medical History:  Past Medical History:  Diagnosis Date  . Abdominal pain, other specified site 09/26/2013  . Anxiety   . Arthritis   . Atherosclerosis of arteries 05/13/2013  . Cough 09/26/2013  . Depression   . Eating disorder   . GERD (gastroesophageal reflux disease)   . Hepatitis C   . Hypertension   . Lesion of liver 04/01/2013  . Loss of weight 06/21/2014  . Medial knee pain 09/26/2013  . Palpitations 06/21/2014  . Shortness of breath dyspnea    with exertion   Past Surgical History:  Past Surgical History:  Procedure Laterality Date  . BOWEL RESECTION  11/15/2020   Procedure: SMALL BOWEL RESECTION;  Surgeon: Donnie Mesa, MD;  Location: [place];  Service: General;;  . GASTROSTOMY Left 12/08/2020   Procedure: INSERTION OF GASTROSTOMY TUBE;  Surgeon: Donnie Mesa, MD;  Location: [place];  Service: General;  Laterality: Left;  . ILEOSTOMY N/A 11/30/2020   Procedure: ILEOSTOMY WITH MUCOUS FISTULA CREATION;  Surgeon: Georganna Skeans, MD;  Location: [place];  Service: General;  Laterality: N/A;  . IR ANGIO INTRA EXTRACRAN SEL COM CAROTID INNOMINATE BILAT MOD SED  09/09/2017  . IR ANGIO VERTEBRAL SEL VERTEBRAL BILAT MOD SED  09/09/2017  . IR RADIOLOGIST EVAL & MGMT  09/07/2017  . LAPAROTOMY N/A 11/16/2020   Procedure: EXPLORATORY LAPAROTOMY;  Surgeon: Donnie Mesa, MD;  Location: [place];  Service: General;  Laterality: N/A;  . LAPAROTOMY N/A 12/11/2020   Procedure: EXPLORATORY LAPAROTOMY SMALL BOWEL RESECTION WITHOUT ANASTOMOSIS WITH APPLICATION OF WOUND VAC;  Surgeon: Dwan Bolt, MD;  Location: [place];  Service: General;  Laterality: N/A;  . LAPAROTOMY N/A 12/03/2020   Procedure: EXPLORATORY LAPAROTOMY AND ABDOMINAL CLOSURE;  Surgeon: Georganna Skeans, MD;  Location: [place];  Service: General;  Laterality: N/A;  . LYSIS OF ADHESION  11/13/2020   Procedure: LYSIS OF ADHESIONS;  Surgeon: Donnie Mesa, MD;  Location: [place];  Service: General;;  . MICROLARYNGOSCOPY WITH CO2 LASER AND EXCISION OF VOCAL CORD LESION N/A 05/14/2016   Procedure: MICROLARYNGOSCOPY WITH CO2 LASER AND EXCISION OF VOCAL CORD LESION;  Surgeon: Melida Quitter, MD;  Location: [place];  Service: ENT;  Laterality: N/A;  Suspended micro laryngoscopy with CO2 laser excision of vocal fold lesion  . TUMOR REMOVAL  1990   bladder   HPI:  Pt is a 74 y.o. male admitted 12/09/2020 with abdominal pain and nausea; workup revealed SBO, s/p NGT placement. Also with AKI. Pt is now s/p multiple ex lap with revisions and placement of G-tube. Pt self-extubated 3 times, is now s/p placement of trach on 12/29. PMH includes stroke, HTN, bladder CA s/p urostomy, seizure, Hep C, anxiety, depression   Assessment / Plan / Recommendation Clinical Impression  Inline PMV asessment in conjunction with RT who deflated cuff and determined patent airway with adequate drop in VTe. PEEP lowered to zero. RT stated pt has has copious secretions. Mild amount expectorated orally this evaluation with pt noted to swallow frequently. Eyes opened but head turned away from therapist. He would focus attention when name called and sustain for several seconds. He did not follow commands and vocalized once after repeated questions and opportunities fo verbalization. Eventually he answered "yeah"in low intensity and vocal quality if he could understand what this therapist  was saying, His RR, SpO2 and HR remained stable. Question how much is behavioral and/or there is hypoxia this admission. SLP will follow pt for minimun one time a week and can upgrade frequency as he can participate more. PMV with SLP only. SLP Visit Diagnosis: Aphonia (R49.1)    SLP Assessment  Patient needs continued Speech Lanaguage Pathology Services    Follow Up  Recommendations  LTACH;Skilled Nursing facility    Frequency and Duration min 1 x/week  2 weeks    PMSV Trial PMSV was placed for: 10 min Able to redirect subglottic air through upper airway: Yes Able to Attain Phonation: Yes Voice Quality: Hoarse;Low vocal intensity;Wet Able to Expectorate Secretions: Yes Level of Secretion Expectoration with PMSV: Oral Breath Support for Phonation: Mildly decreased Intelligibility: Unable to assess (comment) Respirations During Trial: 26 SpO2 During Trial: 95 % Behavior: No attempt to communicate   Tracheostomy Tube       Vent Dependency  Vent Mode: CPAP;PSV PEEP: 5 cmH20 Pressure Support: 15 cmH20 FiO2 (%): 30 %    Cuff Deflation Trial  GO Tolerated Cuff Deflation: Yes Length of Time for Cuff Deflation Trial: 10 min Behavior:  (poor eye contact)        [name] 12/12/2020, 1:36 PM Breck Coons Tania Steinhauser M.Ed Nurse, children's [phone] Office [phone]

## 2020-12-13 ENCOUNTER — Inpatient Hospital Stay (HOSPITAL_COMMUNITY): Payer: Medicare PPO

## 2020-12-13 DIAGNOSIS — N179 Acute kidney failure, unspecified: Secondary | ICD-10-CM | POA: Diagnosis not present

## 2020-12-13 DIAGNOSIS — J9601 Acute respiratory failure with hypoxia: Secondary | ICD-10-CM | POA: Diagnosis not present

## 2020-12-13 DIAGNOSIS — G9340 Encephalopathy, unspecified: Secondary | ICD-10-CM | POA: Diagnosis not present

## 2020-12-13 LAB — MAGNESIUM: Magnesium: 1.7 mg/dL (ref 1.7–2.4)

## 2020-12-13 LAB — GLUCOSE, CAPILLARY
Glucose-Capillary: 160 mg/dL — ABNORMAL HIGH (ref 70–99)
Glucose-Capillary: 139 mg/dL — ABNORMAL HIGH (ref 70–99)
Glucose-Capillary: 236 mg/dL — ABNORMAL HIGH (ref 70–99)
Glucose-Capillary: 243 mg/dL — ABNORMAL HIGH (ref 70–99)
Glucose-Capillary: 261 mg/dL — ABNORMAL HIGH (ref 70–99)

## 2020-12-13 LAB — BASIC METABOLIC PANEL
Anion gap: 13 (ref 5–15)
BUN: 37 mg/dL — ABNORMAL HIGH (ref 8–23)
CO2: 19 mmol/L — ABNORMAL LOW (ref 22–32)
Calcium: 6.2 mg/dL — CL (ref 8.9–10.3)
Chloride: 111 mmol/L (ref 98–111)
Creatinine, Ser: 1.31 mg/dL — ABNORMAL HIGH (ref 0.61–1.24)
GFR, Estimated: 57 mL/min — ABNORMAL LOW (ref >60)
Glucose, Bld: 117 mg/dL — ABNORMAL HIGH (ref 70–99)
Potassium: 3.4 mmol/L — ABNORMAL LOW (ref 3.5–5.1)
Sodium: 143 mmol/L (ref 135–145)

## 2020-12-13 LAB — PHOSPHORUS: Phosphorus: 2.3 mg/dL — ABNORMAL LOW (ref 2.5–4.6)

## 2020-12-13 MED ORDER — LOPERAMIDE HCL 1 MG/7.5ML PO SUSP
4.0000 mg | Freq: Four times a day (QID) | ORAL | Status: DC
  Administered 2020-12-13 – 2020-12-17 (×16): 4 mg
  Filled 2020-12-13 (×18): qty 30

## 2020-12-13 MED ORDER — METOPROLOL TARTRATE 25 MG PO TABS
25.0000 mg | ORAL_TABLET | Freq: Two times a day (BID) | ORAL | Status: DC
  Administered 2020-12-13: 25 mg
  Filled 2020-12-13: qty 1

## 2020-12-13 MED ORDER — DIPHENOXYLATE-ATROPINE 2.5-0.025 MG/5ML PO LIQD
5.0000 mL | Freq: Four times a day (QID) | ORAL | Status: DC
  Administered 2020-12-13 – 2021-01-05 (×89): 5 mL
  Filled 2020-12-13 (×90): qty 5

## 2020-12-13 MED ORDER — TRAVASOL 10 % IV SOLN
INTRAVENOUS | Status: AC
  Filled 2020-12-13: qty 1200

## 2020-12-13 MED ORDER — LABETALOL HCL 5 MG/ML IV SOLN: 10.0000 mg | INTRAVENOUS | Status: DC | PRN

## 2020-12-13 MED ORDER — MAGNESIUM SULFATE IN D5W 1-5 GM/100ML-% IV SOLN
1.0000 g | Freq: Once | INTRAVENOUS | Status: AC
  Administered 2020-12-13: 1 g via INTRAVENOUS
  Filled 2020-12-13: qty 100

## 2020-12-13 MED ORDER — POTASSIUM PHOSPHATES 15 MMOLE/5ML IV SOLN
20.0000 mmol | Freq: Once | INTRAVENOUS | Status: AC
  Administered 2020-12-13: 20 mmol via INTRAVENOUS
  Filled 2020-12-13: qty 6.67

## 2020-12-13 NOTE — Progress Notes (Signed)
eLink Physician-Brief Progress Note Patient Name: Ronald Wolf DOB: 05/25/1946 MRN: [id number]   Date of Service  12/13/2020  HPI/Events of Note  HTN with SBP in 200s. Comfortable at thsi time on camera and HR is 108. Got hydralazine at 8 pm (5 mg)  eICU Interventions  PRN labetalol      Intervention Category Major Interventions: Hypertension - evaluation and management  Oretha Milch 12/13/2020, 9:18 PM
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
## 2020-12-13 NOTE — Progress Notes (Signed)
eLink Physician-Brief Progress Note Patient Name: Ronald Wolf DOB: 14-Aug-1946 MRN: [id number]   Date of Service  12/13/2020  HPI/Events of Note  Corrected Calcium  8.3 gm / dl, patient has TPN infusing.  eICU Interventions  No intervention.        Thomasene Lot Ryker Pherigo 12/13/2020, 6:40 AM

## 2020-12-13 NOTE — Progress Notes (Signed)
PHARMACY - TOTAL PARENTERAL NUTRITION CONSULT NOTE  Indication: Small bowel obstruction, prolonged ileus  Patient Measurements: Height: 5\' 11"  (180.3 cm) Weight: 61.2 kg (134 lb 14.7 oz) IBW/kg (Calculated) : 75.3 TPN AdjBW (KG): 60.8 Body mass index is 18.82 kg/m. Usual Weight: 137 lbs  Assessment:  74 YOM presented 11/24/2020 with nausea, vomiting, abdominal pain after leaving AMA at [facility] with known SBO. Patient has been on bowel rest with NG tube decompression. Patient continued to have persistent SBO on imaging with no evidence of improvement, now s/p ex-lap with LoA, SBR and placement of Gtube on 11/21/2020. Pharmacy consulted for TPN management - patient received TPN 11/19/20 through 11/29/20.  Reconsulted to resume TPN on 12/21 despite tube feeds at goal rate due to high ostomy output and concern for tube feeds not absorbing. Patient on loperamide and fiber without improvement in output.   Glucose / Insulin: no hx DM - CBGs <180 on TPN - utilized 6 units SSI/24hrs Electrolytes: K 3.4, Na 143, Phos 2.3, Mg 1.7, HCO3 19 Renal: Scr 1.31 << 1.17, BUN down 37) LFTs / TGs: LFTs and TG wnl, Tbili 1.6>0.9 (12/30) Prealbumin / albumin: Prealbumin down 7.7 (12/27), alb 1.3 (12/30) Intake / Output; MIVF: UOP 2.8 ml/kg/hr, ileostomy output/24h: 1750 (on fiber, opium tincture, loperamide and unlikely to improve per Surgery), drain O/P 46ml, net -3.4L GI Imaging:  12/11 DG Abd - stomach gas-filled, without distention, no obvious free air 12/23 CT abd pelvis - large loculated collection in low abd/pelvis, few foci of internal gas present, new RUQ ileostomy, thickened distal SB/colon, subtotal atelectatic collapse of LLL, partial collapse of RLL, small R pleural effusion, body wall edema Surgeries / Procedures: 12/12: re-exploration and washout with new copious drainage from incision concerning for enteric leak vs. enterotomy >> now s/p, bowel necrosis resected 12/15: ex-lap with ileostomy with  fistula creation, abdominal closure 12/24 Pelvic abscess drain 12/29 - trach placed  Central access: PICC placed 11/20/2020 TPN start date: 11/19/20>>11/29/20; restart 12/03/20 >>  Nutritional Goals (updated per RD rec 12/30): 2300-2700 kCal, 120-135g protein, >2L fluid per day  Goal TPN rate of 100 cc/hr will provide 120g AA, 432g CHO, and 36g lipids (15% of total kCal due to 1/31) for a total of 2308 kcal, meeting 100% of needs  Current Nutrition:  TPN Osmolite 1.5 at 30 ml/hr (goal rate 65 ml/hr) ProSource TF 45 ml TID (each 11g AA and 40 kcal)  Plan:  Continue TPN at 100 ml/hr at 1800  TPN will provide 120g protein, 432g CHO, 36g SMOF lipids (~15% of total kcal due to [employer]), and 2308 kcal meeting 100% of estimated needs Electrolytes in TPN: cont Na 20 mEq/L, incr K to 50mEq/L, incr Ca to 5 mEq/L, incr Mg to 7 mEq/L, cont Phos 57mmol/L, adjust Cl:Ac to max acetate KPhos 20 mmol x 1 dose today Mg 1g IV x 1 dose Add standard MVI and trace elements + folic acid   Continue moderate SSI to q8h and adjust as needed BMP, Mg, Phos in AM F/u TF tolerability with rate increase, ileostomy output, and ability to wean TPN    Thank you for allowing pharmacy to be a part of this patient's care.  12m, PharmD, BCPS Clinical Pharmacist Clinical phone for 12/13/2020: [phone] 12/13/2020 9:52 AM   **Pharmacist phone directory can now be found on [email or web address] ([email or web address]).  Listed under [facility].

## 2020-12-13 NOTE — Progress Notes (Signed)
[facility] Progress Note  17 Days Post-Op  Subjective: No acute changes. Ileostomy output very high, feeds at 30.  Objective: Vital signs in last 24 hours: Temp:  [97.9 F (36.6 C)-100 F (37.8 C)] 97.9 F (36.6 C) (01/01 2000) Pulse Rate:  [85-115] 98 (01/01 2045) Resp:  [17-34] 25 (01/01 2045) BP: (137-214)/(65-111) 177/85 (01/01 2045) SpO2:  [92 %-100 %] 100 % (01/01 2045) FiO2 (%):  [30 %-40 %] 40 % (01/01 2010) Weight:  [65.8 kg] 65.8 kg (01/01 0500) Last BM Date: 12/12/20  Intake/Output from previous day: 12/31 0701 - 01/01 0700 In: 4648.7 [I.V.:2499.7; NG/GT:1690; IV Piggyback:459] Out: [id number] [Urine:4400; Drains:20; Stool:1750] Intake/Output this shift: Total I/O In: 1123 [I.V.:217.4; NG/GT:860; IV Piggyback:45.6] Out: -   PE: Gen:  Alert, no distress HEENT: trach in place Card:  RRR Pulm:  On vent Abd: soft, NT, open midline wound with fibrinous exudate and some tissue necrosis but fascia intact/ sutures visible, RUQ ostomy (mucus fistula) dusky and sloughing with scant SS output, LLQ ostomy (end ileostomy) pink and productive of liquid stool, urostomy productive of clear urine, g-tube site c/d with TF running at 30cc/hr, IR drain with purulent fluid in bag Skin: warm and dry  Lab Results:  No results for input(s): WBC, HGB, HCT, PLT in the last 72 hours. BMET Recent Labs    12/12/20 1625 12/13/20 0524  NA 134* 143  K 4.4 3.4*  CL 100 111  CO2 25 19*  GLUCOSE 153* 117*  BUN 47* 37*  CREATININE 1.17 1.31*  CALCIUM 7.7* 6.2*   PT/INR No results for input(s): LABPROT, INR in the last 72 hours. CMP     Component Value Date/Time   NA 143 12/13/2020 0524   NA 145 03/08/2016 1356   K 3.4 (L) 12/13/2020 0524   K 4.5 03/08/2016 1356   CL 111 12/13/2020 0524   CO2 19 (L) 12/13/2020 0524   CO2 28 03/08/2016 1356   GLUCOSE 117 (H) 12/13/2020 0524   GLUCOSE 98 03/08/2016 1356   BUN 37 (H) 12/13/2020 0524   BUN 14.2 03/08/2016 1356    CREATININE 1.31 (H) 12/13/2020 0524   CREATININE 0.9 03/08/2016 1356   CALCIUM 6.2 (LL) 12/13/2020 0524   CALCIUM 9.8 03/08/2016 1356   PROT 5.3 (L) 12/11/2020 0329   PROT 8.2 03/08/2016 1356   ALBUMIN 1.3 (L) 12/11/2020 0329   ALBUMIN 3.6 03/08/2016 1356   AST 22 12/11/2020 0329   AST 42 (H) 03/08/2016 1356   ALT 19 12/11/2020 0329   ALT 43 03/08/2016 1356   ALKPHOS 79 12/11/2020 0329   ALKPHOS 79 03/08/2016 1356   BILITOT 0.9 12/11/2020 0329   BILITOT 0.74 03/08/2016 1356   GFRNONAA 57 (L) 12/13/2020 0524   GFRAA >60 12/31/2019 0221   Lipase     Component Value Date/Time   LIPASE 57 (H) 11/22/2020 1830       Studies/Results: DG Chest Port 1 View  Result Date: 12/13/2020 CLINICAL DATA:  75 year old male with respiratory failure. Status post exploratory laparotomy last month with resection of ischemic bowel. EXAM: PORTABLE CHEST 1 VIEW COMPARISON:  Portable chest 12/10/2020 and earlier. FINDINGS: Portable AP semi upright view at 0617 hours. Tracheostomy tube remains in place. Stable left PICC line. Stable lung volumes and ventilation since 12/09/2020 with bilateral veiling pleural effusions and lower lobe collapse. No pneumothorax or pulmonary edema. No areas of worsening ventilation. IMPRESSION: 1. Tracheostomy tube in place.  Stable left PICC line. 2. Stable ventilation since 12/09/2020 with  bilateral pleural effusions and lower lobe collapse. Electronically Signed   By: Genevie Ann M.D.   On: 12/13/2020 08:47    Anti-infectives: Anti-infectives (From admission, onward)   Start     Dose/Rate Route Frequency Ordered Stop   12/12/20 1800  Ampicillin-Sulbactam (UNASYN) 3 g in sodium chloride 0.9 % 100 mL IVPB        3 g 200 mL/hr over 30 Minutes Intravenous Every 6 hours 12/12/20 1101     12/08/20 1300  vancomycin (VANCOCIN) IVPB 1000 mg/200 mL premix  Status:  Discontinued        1,000 mg 200 mL/hr over 60 Minutes Intravenous Every 24 hours 12/08/20 0845 12/12/20 1059   12/08/20  0100  vancomycin (VANCOREADY) IVPB 500 mg/100 mL  Status:  Discontinued        500 mg 100 mL/hr over 60 Minutes Intravenous Every 12 hours 12/07/20 1130 12/07/20 1337   12/07/20 1215  vancomycin (VANCOREADY) IVPB 1250 mg/250 mL        1,250 mg 166.7 mL/hr over 90 Minutes Intravenous  Once 12/07/20 1124 12/07/20 1336   12/06/20 1030  anidulafungin (ERAXIS) 100 mg in sodium chloride 0.9 % 100 mL IVPB  Status:  Discontinued       "Followed by" Linked Group Details   100 mg 78 mL/hr over 100 Minutes Intravenous Every 24 hours 12/05/20 0939 12/13/20 1230   12/05/20 1030  anidulafungin (ERAXIS) 200 mg in sodium chloride 0.9 % 200 mL IVPB       "Followed by" Linked Group Details   200 mg 78 mL/hr over 200 Minutes Intravenous  Once 12/05/20 0939 12/05/20 1618   12/04/20 1500  piperacillin-tazobactam (ZOSYN) IVPB 3.375 g  Status:  Discontinued        3.375 g 12.5 mL/hr over 240 Minutes Intravenous Every 8 hours 12/04/20 0818 12/12/20 1059   12/04/20 0915  piperacillin-tazobactam (ZOSYN) IVPB 3.375 g        3.375 g 100 mL/hr over 30 Minutes Intravenous  Once 12/04/20 0818 12/04/20 1207   11/22/2020 0945  cefoTEtan (CEFOTAN) 2 g in sodium chloride 0.9 % 100 mL IVPB  Status:  Discontinued        2 g 200 mL/hr over 30 Minutes Intravenous To Surgery 11/25/2020 0936 12/02/2020 1057   12/07/2020 0900  cefoTEtan (CEFOTAN) 2 g in sodium chloride 0.9 % 100 mL IVPB  Status:  Discontinued        2 g 200 mL/hr over 30 Minutes Intravenous  Once 12/12/2020 0833 12/10/2020 0936   11/19/20 1130  piperacillin-tazobactam (ZOSYN) IVPB 3.375 g        3.375 g 12.5 mL/hr over 240 Minutes Intravenous Every 8 hours 11/19/20 1017 11/22/2020 2359   11/19/20 1115  fluconazole (DIFLUCAN) IVPB 200 mg        200 mg 100 mL/hr over 60 Minutes Intravenous Every 24 hours 11/19/20 1017 11/21/20 1104   11/19/20 0915  Ampicillin-Sulbactam (UNASYN) 3 g in sodium chloride 0.9 % 100 mL IVPB  Status:  Discontinued        3 g 200 mL/hr over 30  Minutes Intravenous Every 12 hours 11/19/20 0826 11/19/20 0949       Assessment/Plan SBO -s/p ex lap with SBR, placement of a gastrostomy tube12/7 by Dr. Georgette Dover. Takeback 12/12 by Dr. Zenia Resides for necrotic SB with resection of additional 60-70cm of SB inclusive of prior anastomosis in discontinuity with open abdomen.  - S/P ex lap, ileostomy, mucous fistula, closure 12/15 by Dr. Grandville Silos Malnourishment/FEN- Continue  TNA. Keep feeds at 30 today given high ileostomy output. Continue fiber and imodium, lomotil added on today. VDRF- self-extubated 12/13 AM and 12/28, re-intubated, trach placed. Per CCM Shock/ID- shockresolved, off pressors;necrotic bowel was most likely source.  Intra-abdominal abscess - seen on CT 12/23,s/p perc drain by IR 12/24. Culture with Enterococcus faecalis and Bacteroides fragilis, beta lactamase positive.  CV/AF RVR- in NSRcurrently  VTE-SQH Dispo - ICU.    LOS: 31 days    Fritzi Mandes  MD [facility] 12/13/2020, 9:10 PM Please see Amion for pager number during day hours 7:00am-4:30pm

## 2020-12-13 NOTE — Progress Notes (Signed)
NAME:  GAMBLE ENDERLE, MRN:  [id number], DOB:  February 15, 1946, LOS: 31 ADMISSION DATE:  12/06/2020, CONSULTATION DATE:  12/7 REFERRING MD:  Alanda Slim, CHIEF COMPLAINT:  Abdominal pain   Brief History:  Mr.Disanti is a 75 yo M w/ PMH of CVA, Seizures, Dementia, Bladder ca s/p urostomy, tobacco use, right glottic carcinoma s/p excision presenting with sbo. Found to have ischemic bowel requiring ex-lap 12/7.  Course complicated by septic shock, acute respiratory failure and AKI return to the OR 12/12 for necrotic small bowel resection of additional 70 cm inclusive of prior anastomosis 12/15 abdominal closure with ileostomy He has had a prolonged ICU stay with ventilator dependence, had a tracheostomy on 12/29  Past Medical History:  CVA, Dementia, Bladder CA s/p nephrostomy, tobacco use, right glottic carcinoma s/p excision, seizures.  Significant Hospital Events:  12/05/2020 Admission 11/22/2020 OR 12/12/2020 Admit to ICU 12/10 Brief SVT with associated hypertension. Self limited Given fentanyl, metop and hydral for HTN 12/11 Good urine output with Lasix , failed extubation, reintubated within 2 hours due to inability to clear secretions 12/12 return OR for enteric leak-- found to have bowel necrosis. Left in discontinuity  12/13 self extubated, reintubated. On pressors. Family meeting with PCCM and CCS to discuss possible next steps.  12/15-was back in OR, abdominal closure, RUQ mucus fistula and L ileostomy  12/19: No significant overnight events, high ileostomy output 12/21: scheduled for family meeting. Worse hypernatremia, continued high ileostomy output.  Attempted PSV/CPAP wean, pulled volumes of high 100s -low/mid 200s with tachypnea. Placed back of full support.  12/22 ongoing high output GI/ ileostomy, TPN started; weaned 12/5 all day; off fentanyl gtt, remains on precedex  12/24 IR drain , 1 unit PRBC transfusion for hemoglobin 6.5 12/26 added vancomycin for GPC in blood cultures and RIC suggesting  MRSA 12/29 tracheostomy 12/31 Palliative discussion yesterday > family understanding that his situation is critical and he has not made much improvement   Consults:  General Surgery PCCM  Procedures:  12/12/2020 Ex-lap, G-tube placement ETT 12/7 >> 12/11, >> 12/15- back to OR for abdominal closure, exploratory laparotomy  12/24 CT-guided right pelvic abscess drain 12/29 trach  Significant Diagnostic Tests:  CT abd/pelvis 12/2 > High-grade distal small bowel obstruction, possibly due to adhesion. Mild ascites and diffuse mesenteric edema. No evidence of focal inflammatory process or abscess. Small bilateral pleural effusions and mild right lower lobe Atelectasis. CT abdomen/pelvis 12/23 Interval development of a large loculated collection in the low anterior abdomen and pelvis measuring approximately 6.1 x 11.3 x12.1 cm in size subjacent to the operative site. Few foci of internal gas are present. , no frank spillage of enteric contrast   Micro Data:  11/30/2020 COVID/Flu negative 12/7 BA: Few GNR rare GPC -- predominantly PMN  12/7 BCx> Neg 12/11 respiratory -no organism in tracheal aspirate 12/24 BC >> RIC showing both staph epidermis 1/4 12/24 abscess >> E. Faecalis, B fragilis (B lactamase) 12/27 trach aspirate>> normal flora  Antimicrobials:  Fluc 12/8>> 12/10 Zosyn 12/8> 12/12 cefotetain 12/15>> off Zosyn 12/23 >> eraxis 12/24 >> Vancomycin 12/26   Interim History / Subjective:   Renal function worse 12/31 Palliative discussion yesterday > family understanding that his situation is critical and he has not made much improvement  Objective   Blood pressure (!) 157/72, pulse 93, temperature 99.7 F (37.6 C), temperature source Oral, resp. rate 20, height 5\' 11"  (1.803 m), weight 65.8 kg, SpO2 100 %.    Vent Mode: PRVC FiO2 (%):  [30 %]  30 % Set Rate:  [14 bmp] 14 bmp Vt Set:  [600 mL] 600 mL PEEP:  [5 cmH20] 5 cmH20 Pressure Support:  [15 cmH20] 15 cmH20 Plateau  Pressure:  [20 [id number] cmH20] 21 cmH20   Intake/Output Summary (Last 24 hours) at 12/13/2020 [id number] Last data filed at 12/13/2020 0700 Gross per 24 hour  Intake 4618.69 ml  Output 6170 ml  Net -1551.31 ml   Filed Weights   12/11/20 0341 12/12/20 0500 12/13/20 0500  Weight: 58.1 kg 65.9 kg 65.8 kg    Examination:  General:  In bed on vent HENT: NCAT tracheostomy in place PULM: CTA B, vent supported breathing CV: RRR, no mgr GI: BS+, soft, nontender MSK: normal bulk and tone Neuro: awake, will nod head but doesn't follow commands or engage more   Resolved Hospital Problem list     Assessment & Plan:  Sepsis due to intraabdominal abscess: no clear evidence of worsening, last WBC up slightly Continue amp-sulbactam  D/c eraxis  At this point I do not believe it would be appropriate to escalate care in terms of hemodynamic support if he developed worsening septic shok  Acute encephalopathy, multifactorial Continue supportive care Frequent orientation  Acute hypoxemic respiratory failure requiring mechanical ventilation Full mechanical vent support VAP prevention Daily WUA/SBT  Pleural effusions due to profound malnutrition Minimize IV intake as much as possible  SBO Ischemic bowel perforation, s/p 2 ex-lap for resection of ischemic bowel, mucus fistula and ileostomy, G tube placement Intraabdominal absces s/p IR drain High output ileostomy with bicarbonate wasting Severe Protein calorie malnutrition Continue TPN/enteral supplement NPO Monitor stool output  AKI > worsening Monitor BMET and UOP Replace electrolytes as needed Continue TPN and current   Hypertension, worsening control Increase metoprolol  Atrial fibrillation with RVR> resolved Monitor tele  Seizure> none since Monitor clnically keppra  Bladder Cancer  Anemia, no obvious source of ongoing blood loss Monitor BMET and UOP Replace electrolytes as needed   Goals of care Multiple conversations  in past, see notes from prior.  I spoke to his daughter Darrick Grinder today and she feels that he is sad and not thriving.  She intends to speak to family about withdrawal of care.  Best practice (evaluated daily)  Diet: tube feeding Pain/Anxiety/Delirium protocol (if indicated): minimize sedation VAP protocol (if indicated): yes DVT prophylaxis: as above GI prophylaxis: Pantoprazole for stress ulcer prophylaxis Glucose control: SSI Mobility: bed rest Disposition:full  Goals of Care:  Last date of multidisciplinary goals of care discussion: 1/1  Family and staff present: Lavin Petteway, Daughter Summary of discussion: she feels that they need to move towards comfort measures but desires the support of the family so she plans to discuss his condition with them this weekend Follow up goals of care discussion due: 1/2 Code Status: limited code blue no shock, no CPR  Labs   CBC: Recent Labs  Lab 12/07/20 0533 12/08/20 0428 12/10/20 0500  WBC 21.1* 23.8* 25.4*  NEUTROABS  --  17.9*  --   HGB 7.4* 7.1* 7.1*  HCT 23.1* 23.7* 22.9*  MCV 97.9 100.0 95.4  PLT 252 245 [id number]    Basic Metabolic Panel: Recent Labs  Lab 12/09/20 0415 12/10/20 0500 12/11/20 0329 12/12/20 0533 12/12/20 1625 12/13/20 0524  NA 135 139 139  --  134* 143  K 3.6 4.0 3.5  --  4.4 3.4*  CL 108 103 92*  --  100 111  CO2 20* 26 37*  --  25 19*  GLUCOSE 163* 159*  139*  --  153* 117*  BUN 63* 53* 46*  --  47* 37*  CREATININE 1.43* 1.15 1.04  --  1.17 1.31*  CALCIUM 7.5* 7.4* 6.9*  --  7.7* 6.2*  MG 2.0 2.0 1.9 2.1  --  1.7  PHOS 2.8 2.9 2.8 2.9  --  2.3*   GFR: Estimated Creatinine Clearance: 46 mL/min (A) (by C-G formula based on SCr of 1.31 mg/dL (H)). Recent Labs  Lab 12/07/20 0533 12/08/20 0428 12/10/20 0500  WBC 21.1* 23.8* 25.4*    Liver Function Tests: Recent Labs  Lab 12/08/20 0428 12/11/20 0329  AST 18 22  ALT 17 19  ALKPHOS 69 79  BILITOT 1.6* 0.9  PROT 5.4* 5.3*  ALBUMIN 1.6* 1.3*   No  results for input(s): LIPASE, AMYLASE in the last 168 hours. No results for input(s): AMMONIA in the last 168 hours.  ABG    Component Value Date/Time   PHART 7.361 11/29/2020 2113   PCO2ART 47.6 11/18/2020 2113   PO2ART 41 (L) 11/29/2020 2113   HCO3 9.4 (L) 12/08/2020 0822   TCO2 28 11/29/2020 2113   ACIDBASEDEF 19.3 (H) 12/08/2020 0822   O2SAT 78.5 12/08/2020 0822     Coagulation Profile: No results for input(s): INR, PROTIME in the last 168 hours.  Cardiac Enzymes: No results for input(s): CKTOTAL, CKMB, CKMBINDEX, TROPONINI in the last 168 hours.  HbA1C: Hgb A1c MFr Bld  Date/Time Value Ref Range Status  11/19/2020 10:37 AM 5.0 4.8 - 5.6 % Final    Comment:    (NOTE) Pre diabetes:          5.7%-6.4%  Diabetes:              >6.4%  Glycemic control for   <7.0% adults with diabetes   12/17/2019 08:19 AM 4.5 (L) 4.8 - 5.6 % Final    Comment:    (NOTE) Pre diabetes:          5.7%-6.4% Diabetes:              >6.4% Glycemic control for   <7.0% adults with diabetes     CBG: Recent Labs  Lab 12/11/20 2325 12/12/20 0802 12/12/20 1632 12/12/20 2312 12/13/20 0656  GLUCAP 136* 141* 145* 126* 160*     Critical care time: 35 minutes    Roselie Awkward, MD [facility] Pager: [phone] Cell: [phone] If no response, call [phone]

## 2020-12-13 NOTE — Progress Notes (Signed)
CRITICAL VALUE ALERT  Critical Value:  Calcium 6.2  Date & Time Notied:  12/13/2020 1117  Provider Notified: Thedore Mins RN   Orders Received/Actions taken: No changes, Ca corrected for TPN

## 2020-12-13 DEATH — deceased

## 2020-12-14 DIAGNOSIS — K56609 Unspecified intestinal obstruction, unspecified as to partial versus complete obstruction: Secondary | ICD-10-CM | POA: Diagnosis not present

## 2020-12-14 DIAGNOSIS — N179 Acute kidney failure, unspecified: Secondary | ICD-10-CM | POA: Diagnosis not present

## 2020-12-14 DIAGNOSIS — J9601 Acute respiratory failure with hypoxia: Secondary | ICD-10-CM | POA: Diagnosis not present

## 2020-12-14 DIAGNOSIS — G9340 Encephalopathy, unspecified: Secondary | ICD-10-CM | POA: Diagnosis not present

## 2020-12-14 DIAGNOSIS — Z7189 Other specified counseling: Secondary | ICD-10-CM | POA: Diagnosis not present

## 2020-12-14 DIAGNOSIS — Z8551 Personal history of malignant neoplasm of bladder: Secondary | ICD-10-CM | POA: Diagnosis not present

## 2020-12-14 LAB — COMPREHENSIVE METABOLIC PANEL
ALT: 27 U/L (ref 0–44)
AST: 31 U/L (ref 15–41)
Albumin: 1.5 g/dL — ABNORMAL LOW (ref 3.5–5.0)
Alkaline Phosphatase: 107 U/L (ref 38–126)
Anion gap: 9 (ref 5–15)
BUN: 44 mg/dL — ABNORMAL HIGH (ref 8–23)
CO2: 21 mmol/L — ABNORMAL LOW (ref 22–32)
Calcium: 8 mg/dL — ABNORMAL LOW (ref 8.9–10.3)
Chloride: 107 mmol/L (ref 98–111)
Creatinine, Ser: 0.93 mg/dL (ref 0.61–1.24)
GFR, Estimated: >60 mL/min (ref >60)
Glucose, Bld: 161 mg/dL — ABNORMAL HIGH (ref 70–99)
Potassium: 4.5 mmol/L (ref 3.5–5.1)
Sodium: 137 mmol/L (ref 135–145)
Total Bilirubin: 0.8 mg/dL (ref 0.3–1.2)
Total Protein: 6 g/dL — ABNORMAL LOW (ref 6.5–8.1)

## 2020-12-14 LAB — GLUCOSE, CAPILLARY
Glucose-Capillary: 134 mg/dL — ABNORMAL HIGH (ref 70–99)
Glucose-Capillary: 134 mg/dL — ABNORMAL HIGH (ref 70–99)
Glucose-Capillary: 182 mg/dL — ABNORMAL HIGH (ref 70–99)

## 2020-12-14 LAB — MAGNESIUM: Magnesium: 2.1 mg/dL (ref 1.7–2.4)

## 2020-12-14 LAB — PHOSPHORUS: Phosphorus: 3.7 mg/dL (ref 2.5–4.6)

## 2020-12-14 MED ORDER — ACETAMINOPHEN 160 MG/5ML PO SOLN
650.0000 mg | Freq: Four times a day (QID) | ORAL | Status: DC | PRN
  Administered 2020-12-15 – 2021-01-07 (×10): 650 mg
  Filled 2020-12-14 (×10): qty 20.3

## 2020-12-14 MED ORDER — TRAVASOL 10 % IV SOLN
INTRAVENOUS | Status: AC
  Filled 2020-12-14: qty 1200

## 2020-12-14 MED ORDER — ACETAMINOPHEN 650 MG RE SUPP: 650.0000 mg | RECTAL | Status: DC | PRN

## 2020-12-14 MED ORDER — HYDRALAZINE HCL 25 MG PO TABS
25.0000 mg | ORAL_TABLET | Freq: Three times a day (TID) | ORAL | Status: DC
  Administered 2020-12-14 – 2020-12-15 (×3): 25 mg
  Filled 2020-12-14 (×3): qty 1

## 2020-12-14 NOTE — Progress Notes (Signed)
[facility] Progress Note  18 Days Post-Op  Subjective: No acute changes. Ileostomy output high 1225 mL last 24 hours, feeds at 30.  Objective: Vital signs in last 24 hours: Temp:  [97.9 F (36.6 C)-99 F (37.2 C)] 97.9 F (36.6 C) (01/02 0911) Pulse Rate:  [83-115] 93 (01/02 1000) Resp:  [19-36] 24 (01/02 1000) BP: (117-214)/(56-111) 152/62 (01/02 1000) SpO2:  [92 %-100 %] 100 % (01/02 1000) FiO2 (%):  [30 %-40 %] 40 % (01/02 0733) Weight:  [64.1 kg] 64.1 kg (01/02 0500) Last BM Date: 12/13/20  Intake/Output from previous day: 01/01 0701 - 01/02 0700 In: 5687 [I.V.:2575.5; NG/GT:1920; IV Piggyback:1191.5] Out: 5085 [Urine:3800; Drains:60; Stool:1225] Intake/Output this shift: Total I/O In: 650 [I.V.:330; NG/GT:320] Out: -   PE: Gen:  Alert, no distress HEENT: trach in place Card:  RRR Pulm:  On vent Abd: soft, NT, open midline wound with fibrinous exudate and some tissue necrosis but fascia intact/ sutures visible, RUQ ostomy (mucus fistula) dusky and sloughing with scant SS output, LLQ ostomy (end ileostomy) pink and productive of liquid stool, urostomy productive of clear urine, g-tube site c/d with TF running at 30cc/hr, IR drain with purulent fluid in bag Skin: warm and dry  Lab Results:  No results for input(s): WBC, HGB, HCT, PLT in the last 72 hours. BMET Recent Labs    12/13/20 0524 12/14/20 0226  NA 143 137  K 3.4* 4.5  CL 111 107  CO2 19* 21*  GLUCOSE 117* 161*  BUN 37* 44*  CREATININE 1.31* 0.93  CALCIUM 6.2* 8.0*   PT/INR No results for input(s): LABPROT, INR in the last 72 hours. CMP     Component Value Date/Time   NA 137 12/14/2020 0226   NA 145 03/08/2016 1356   K 4.5 12/14/2020 0226   K 4.5 03/08/2016 1356   CL 107 12/14/2020 0226   CO2 21 (L) 12/14/2020 0226   CO2 28 03/08/2016 1356   GLUCOSE 161 (H) 12/14/2020 0226   GLUCOSE 98 03/08/2016 1356   BUN 44 (H) 12/14/2020 0226   BUN 14.2 03/08/2016 1356   CREATININE 0.93  12/14/2020 0226   CREATININE 0.9 03/08/2016 1356   CALCIUM 8.0 (L) 12/14/2020 0226   CALCIUM 9.8 03/08/2016 1356   PROT 6.0 (L) 12/14/2020 0226   PROT 8.2 03/08/2016 1356   ALBUMIN 1.5 (L) 12/14/2020 0226   ALBUMIN 3.6 03/08/2016 1356   AST 31 12/14/2020 0226   AST 42 (H) 03/08/2016 1356   ALT 27 12/14/2020 0226   ALT 43 03/08/2016 1356   ALKPHOS 107 12/14/2020 0226   ALKPHOS 79 03/08/2016 1356   BILITOT 0.8 12/14/2020 0226   BILITOT 0.74 03/08/2016 1356   GFRNONAA >60 12/14/2020 0226   GFRAA >60 12/31/2019 0221   Lipase     Component Value Date/Time   LIPASE 57 (H) 11/22/2020 1830       Studies/Results: DG Chest Port 1 View  Result Date: 12/13/2020 CLINICAL DATA:  75 year old male with respiratory failure. Status post exploratory laparotomy last month with resection of ischemic bowel. EXAM: PORTABLE CHEST 1 VIEW COMPARISON:  Portable chest 12/10/2020 and earlier. FINDINGS: Portable AP semi upright view at 0617 hours. Tracheostomy tube remains in place. Stable left PICC line. Stable lung volumes and ventilation since 12/09/2020 with bilateral veiling pleural effusions and lower lobe collapse. No pneumothorax or pulmonary edema. No areas of worsening ventilation. IMPRESSION: 1. Tracheostomy tube in place.  Stable left PICC line. 2. Stable ventilation since 12/09/2020 with bilateral  pleural effusions and lower lobe collapse. Electronically Signed   By: Genevie Ann M.D.   On: 12/13/2020 08:47    Anti-infectives: Anti-infectives (From admission, onward)   Start     Dose/Rate Route Frequency Ordered Stop   12/12/20 1800  Ampicillin-Sulbactam (UNASYN) 3 g in sodium chloride 0.9 % 100 mL IVPB        3 g 200 mL/hr over 30 Minutes Intravenous Every 6 hours 12/12/20 1101     12/08/20 1300  vancomycin (VANCOCIN) IVPB 1000 mg/200 mL premix  Status:  Discontinued        1,000 mg 200 mL/hr over 60 Minutes Intravenous Every 24 hours 12/08/20 0845 12/12/20 1059   12/08/20 0100  vancomycin  (VANCOREADY) IVPB 500 mg/100 mL  Status:  Discontinued        500 mg 100 mL/hr over 60 Minutes Intravenous Every 12 hours 12/07/20 1130 12/07/20 1337   12/07/20 1215  vancomycin (VANCOREADY) IVPB 1250 mg/250 mL        1,250 mg 166.7 mL/hr over 90 Minutes Intravenous  Once 12/07/20 1124 12/07/20 1336   12/06/20 1030  anidulafungin (ERAXIS) 100 mg in sodium chloride 0.9 % 100 mL IVPB  Status:  Discontinued       "Followed by" Linked Group Details   100 mg 78 mL/hr over 100 Minutes Intravenous Every 24 hours 12/05/20 0939 12/13/20 1230   12/05/20 1030  anidulafungin (ERAXIS) 200 mg in sodium chloride 0.9 % 200 mL IVPB       "Followed by" Linked Group Details   200 mg 78 mL/hr over 200 Minutes Intravenous  Once 12/05/20 0939 12/05/20 1618   12/04/20 1500  piperacillin-tazobactam (ZOSYN) IVPB 3.375 g  Status:  Discontinued        3.375 g 12.5 mL/hr over 240 Minutes Intravenous Every 8 hours 12/04/20 0818 12/12/20 1059   12/04/20 0915  piperacillin-tazobactam (ZOSYN) IVPB 3.375 g        3.375 g 100 mL/hr over 30 Minutes Intravenous  Once 12/04/20 0818 12/04/20 1207   11/23/2020 0945  cefoTEtan (CEFOTAN) 2 g in sodium chloride 0.9 % 100 mL IVPB  Status:  Discontinued        2 g 200 mL/hr over 30 Minutes Intravenous To Surgery 11/29/2020 0936 11/23/2020 1057   11/19/2020 0900  cefoTEtan (CEFOTAN) 2 g in sodium chloride 0.9 % 100 mL IVPB  Status:  Discontinued        2 g 200 mL/hr over 30 Minutes Intravenous  Once 12/07/2020 0833 11/17/2020 0936   11/19/20 1130  piperacillin-tazobactam (ZOSYN) IVPB 3.375 g        3.375 g 12.5 mL/hr over 240 Minutes Intravenous Every 8 hours 11/19/20 1017 11/21/2020 2359   11/19/20 1115  fluconazole (DIFLUCAN) IVPB 200 mg        200 mg 100 mL/hr over 60 Minutes Intravenous Every 24 hours 11/19/20 1017 11/21/20 1104   11/19/20 0915  Ampicillin-Sulbactam (UNASYN) 3 g in sodium chloride 0.9 % 100 mL IVPB  Status:  Discontinued        3 g 200 mL/hr over 30 Minutes Intravenous  Every 12 hours 11/19/20 0826 11/19/20 0949       Assessment/Plan SBO -s/p ex lap with SBR, placement of a gastrostomy tube12/7 by Dr. Georgette Dover. Takeback 12/12 by Dr. Zenia Resides for necrotic SB with resection of additional 60-70cm of SB inclusive of prior anastomosis in discontinuity with open abdomen.  - S/P ex lap, ileostomy, mucous fistula, closure 12/15 by Dr. Grandville Silos Malnourishment/FEN- Continue TNA.  Keep feeds at 30 today given high ileostomy output. Continue fiber and imodium, lomotil, tincture of opium.  May benefit from [facility]? VDRF- self-extubated 12/13 AM and 12/28, re-intubated, trach placed. Per CCM Shock/ID- shockresolved, off pressors;necrotic bowel was most likely source.  Intra-abdominal abscess - seen on CT 12/23,s/p perc drain by IR 12/24. Culture with Enterococcus faecalis and Bacteroides fragilis, beta lactamase positive.  CV/AF RVR- in NSRcurrently  VTE-SQH Dispo - ICU.    LOS: 32 days    [facility] 12/14/2020, 11:03 AM Please see Amion for pager number during day hours 7:00am-4:30pm

## 2020-12-14 NOTE — Progress Notes (Addendum)
eLink Physician-Brief Progress Note Patient Name: Ronald Wolf DOB: 02/18/1946 MRN: [id number]   Date of Service  12/14/2020  HPI/Events of Note  Notified that during turning, patient dropped down his HR to 40s . Was given fentanyl before (not new). Was given his scheduled oral metoprolol earlier (not new). Never needed the labetalol that was ordered anyway.   eICU Interventions  On camera, rates back to 80s/90s, sinus rhythm and bp in 130s, no distress Will check AM labs now including electrolytes so we can replete if needed D/W RN who will let us know once results are available     Intervention Category Major Interventions: Arrhythmia - evaluation and management  [name] [name] 12/14/2020, 12:12 AM   6:25 am Notified now of labs K, mag, phos, corrected cal are all ok HR in 80s, stable BP I am going to DC the order for metoprolol entirely.

## 2020-12-14 NOTE — Progress Notes (Signed)
NAME:  Ronald Wolf, MRN:  [id number], DOB:  Oct 30, 1946, LOS: 32 ADMISSION DATE:  11/17/2020, CONSULTATION DATE:  12/7 REFERRING MD:  Alanda Slim, CHIEF COMPLAINT:  Abdominal pain   Brief History:  Ronald Wolf is a 75 yo M w/ PMH of CVA, Seizures, Dementia, Bladder ca s/p urostomy, tobacco use, right glottic carcinoma s/p excision presenting with sbo. Found to have ischemic bowel requiring ex-lap 12/7.  Course complicated by septic shock, acute respiratory failure and AKI return to the OR 12/12 for necrotic small bowel resection of additional 70 cm inclusive of prior anastomosis 12/15 abdominal closure with ileostomy He has had a prolonged ICU stay with ventilator dependence, had a tracheostomy on 12/29  Past Medical History:  CVA, Dementia, Bladder CA s/p nephrostomy, tobacco use, right glottic carcinoma s/p excision, seizures.  Significant Hospital Events:  11/19/2020 Admission 12/02/2020 OR 11/17/2020 Admit to ICU 12/10 Brief SVT with associated hypertension. Self limited Given fentanyl, metop and hydral for HTN 12/11 Good urine output with Lasix , failed extubation, reintubated within 2 hours due to inability to clear secretions 12/12 return OR for enteric leak-- found to have bowel necrosis. Left in discontinuity  12/13 self extubated, reintubated. On pressors. Family meeting with PCCM and CCS to discuss possible next steps.  12/15-was back in OR, abdominal closure, RUQ mucus fistula and L ileostomy  12/19: No significant overnight events, high ileostomy output 12/21: scheduled for family meeting. Worse hypernatremia, continued high ileostomy output.  Attempted PSV/CPAP wean, pulled volumes of high 100s -low/mid 200s with tachypnea. Placed back of full support.  12/22 ongoing high output GI/ ileostomy, TPN started; weaned 12/5 all day; off fentanyl gtt, remains on precedex  12/24 IR drain , 1 unit PRBC transfusion for hemoglobin 6.5 12/26 added vancomycin for GPC in blood cultures and RIC suggesting  MRSA 12/29 tracheostomy 12/31 Palliative discussion yesterday > family understanding that his situation is critical and he has not made much improvement    Consults:  General Surgery PCCM  Procedures:  12/08/2020 Ex-lap, G-tube placement ETT 12/7 >> 12/11, >> 12/15- back to OR for abdominal closure, exploratory laparotomy  12/24 CT-guided right pelvic abscess drain 12/29 trach  Significant Diagnostic Tests:  CT abd/pelvis 12/2 > High-grade distal small bowel obstruction, possibly due to adhesion. Mild ascites and diffuse mesenteric edema. No evidence of focal inflammatory process or abscess. Small bilateral pleural effusions and mild right lower lobe Atelectasis. CT abdomen/pelvis 12/23 Interval development of a large loculated collection in the low anterior abdomen and pelvis measuring approximately 6.1 x 11.3 x12.1 cm in size subjacent to the operative site. Few foci of internal gas are present. , no frank spillage of enteric contrast   Micro Data:  12/12/2020 COVID/Flu negative 12/7 BA: Few GNR rare GPC -- predominantly PMN  12/7 BCx> Neg 12/11 respiratory -no organism in tracheal aspirate 12/24 BC >> RIC showing both staph epidermis 1/4 12/24 abscess >> E. Faecalis, B fragilis (B lactamase) 12/27 trach aspirate>> normal flora  Antimicrobials:  Fluc 12/8>> 12/10 Zosyn 12/8> 12/12 cefotetain 12/15>> off Zosyn 12/23 >> eraxis 12/24 >> Vancomycin 12/26   Interim History / Subjective:   Hypertension and bradycardia episodes overnight Labetalol added Bradycardia occurred with turning Yesterday evening I discussed Yan's situation with Ulinda and she stated she needed to talk to the rest of the family  Objective   Blood pressure (!) 152/62, pulse 90, temperature 97.9 F (36.6 C), temperature source Axillary, resp. rate (!) 25, height 5\' 11"  (1.803 m), weight 64.1 kg, SpO2  100 %.    Vent Mode: PRVC FiO2 (%):  [30 %-40 %] 40 % Set Rate:  [14 bmp] 14 bmp Vt Set:  [600 mL] 600  mL PEEP:  [5 cmH20] 5 cmH20 Plateau Pressure:  [16 cmH20-21 cmH20] 21 cmH20   Intake/Output Summary (Last 24 hours) at 12/14/2020 1143 Last data filed at 12/14/2020 1000 Gross per 24 hour  Intake 6226.98 ml  Output 4185 ml  Net 2041.98 ml   Filed Weights   12/12/20 0500 12/13/20 0500 12/14/20 0500  Weight: 65.9 kg 65.8 kg 64.1 kg    Examination:  General:  In bed on vent HENT: NCAT tracheostomy in place PULM: CTA B, vent supported breathing CV: RRR, no mgr GI: BS+, soft, nontender MSK: normal bulk and tone Neuro: awake, following commands intermittently   Resolved Hospital Problem list     Assessment & Plan:  Remains critically ill due to:  Sepsis due to intraabdominal abscess: no clear evidence of worsening, last WBC up slightly Continue amp-sulbactam D/c eraxis See note: would hesitate to add vasopressors if worsens  Acute encephalopathy, multifactorial Continue supportive care Frequent orientation  Acute hypoxemic respiratory failure requiring mechanical ventilation Full mechanical vent support VAP prevention Daily WUA/SBT  Pleural effusions due to profound malnutrition Minimize IV intake as much as possible  SBO Ischemic bowel perforation, s/p 2 ex-lap for resection of ischemic bowel, mucus fistula and ileostomy, G tube placement Intraabdominal absces s/p IR drain High output ileostomy with bicarbonate wasting Severe Protein calorie malnutrition Continue TPN/enteral supplement NPO Monitor stool output  AKI > worsening Monitor BMET and UOP Replace electrolytes as needed Continue TPN and UOP  Hypertension, worsening control Bradycardia on 1/2, metoprolol held Add hydralazine scheduled Continue prn labetalol  Atrial fibrillation with RVR> resolved Tele  Seizure> none since Monitor clinically Keppra  Bladder Cancer  Anemia, no obvious source of ongoing blood loss Monitor for bleeding Transfuse PRBC for Hgb < 7 gm/dL   Goals of care  Multiple conversations in past, see notes from prior.  I spoke to his daughter Darrick Grinder today and she feels that he is sad and not thriving.  She intends to speak to family about withdrawal of care.  Best practice (evaluated daily)  Diet: tube feeding Pain/Anxiety/Delirium protocol (if indicated): minimize sedation VAP protocol (if indicated): yes DVT prophylaxis: as above GI prophylaxis: Pantoprazole for stress ulcer prophylaxis Glucose control: SSI Mobility: bed rest Disposition:full  Goals of Care:  Last date of multidisciplinary goals of care discussion: 1/2  Family and staff present: Michelena Culmer, Daughter Summary of discussion: I updated [name].  She plans to update the rest of the family and try to have a meeting of all stakeholders in the next 24 hours. I stated that currently it appears that he would need long term inpatient care for the rest of his life. Code Status: limited code blue no shock, no CPR  Labs   CBC: Recent Labs  Lab 12/08/20 0428 12/10/20 0500  WBC 23.8* 25.4*  NEUTROABS 17.9*  --   HGB 7.1* 7.1*  HCT 23.7* 22.9*  MCV 100.0 95.4  PLT 245 [id number]    Basic Metabolic Panel: Recent Labs  Lab 12/10/20 0500 12/11/20 0329 12/12/20 0533 12/12/20 1625 12/13/20 0524 12/14/20 0226  NA 139 139  --  134* 143 137  K 4.0 3.5  --  4.4 3.4* 4.5  CL 103 92*  --  100 111 107  CO2 26 37*  --  25 19* 21*  GLUCOSE 159* 139*  --  153* 117* 161*  BUN 53* 46*  --  47* 37* 44*  CREATININE 1.15 1.04  --  1.17 1.31* 0.93  CALCIUM 7.4* 6.9*  --  7.7* 6.2* 8.0*  MG 2.0 1.9 2.1  --  1.7 2.1  PHOS 2.9 2.8 2.9  --  2.3* 3.7   GFR: Estimated Creatinine Clearance: 63.2 mL/min (by C-G formula based on SCr of 0.93 mg/dL). Recent Labs  Lab 12/08/20 0428 12/10/20 0500  WBC 23.8* 25.4*    Liver Function Tests: Recent Labs  Lab 12/08/20 0428 12/11/20 0329 12/14/20 0226  AST 18 22 31   ALT 17 19 27   ALKPHOS 69 79 107  BILITOT 1.6* 0.9 0.8  PROT 5.4* 5.3* 6.0*  ALBUMIN 1.6*  1.3* 1.5*   No results for input(s): LIPASE, AMYLASE in the last 168 hours. No results for input(s): AMMONIA in the last 168 hours.  ABG    Component Value Date/Time   PHART 7.361 11/18/2020 2113   PCO2ART 47.6 11/22/2020 2113   PO2ART 41 (L) 11/27/2020 2113   HCO3 9.4 (L) 12/08/2020 0822   TCO2 28 11/12/2020 2113   ACIDBASEDEF 19.3 (H) 12/08/2020 0822   O2SAT 78.5 12/08/2020 0822     Coagulation Profile: No results for input(s): INR, PROTIME in the last 168 hours.  Cardiac Enzymes: No results for input(s): CKTOTAL, CKMB, CKMBINDEX, TROPONINI in the last 168 hours.  HbA1C: Hgb A1c MFr Bld  Date/Time Value Ref Range Status  11/19/2020 10:37 AM 5.0 4.8 - 5.6 % Final    Comment:    (NOTE) Pre diabetes:          5.7%-6.4%  Diabetes:              >6.4%  Glycemic control for   <7.0% adults with diabetes   12/17/2019 08:19 AM 4.5 (L) 4.8 - 5.6 % Final    Comment:    (NOTE) Pre diabetes:          5.7%-6.4% Diabetes:              >6.4% Glycemic control for   <7.0% adults with diabetes     CBG: Recent Labs  Lab 12/13/20 1544 12/13/20 2035 12/13/20 2126 12/13/20 2325 12/14/20 0909  GLUCAP 139* 236* 243* 261* 134*     Critical care time: 30 minutes    Roselie Awkward, MD [place] [facility] Pager: [phone] Cell: [phone] If no response, call [phone]

## 2020-12-14 NOTE — Progress Notes (Signed)
Daily Progress Note   Patient Name: Ronald Wolf       Date: 12/14/2020 DOB: [birth year]  Age: 75 y.o. MRN#: [id number] Attending Physician: Juanito Doom, MD Primary Care Physician: Mosie Lukes, MD Admit Date: 11/14/2020  Reason for Consultation/Follow-up: Establishing goals of care  Subjective: Patient continues to fail to improve. PCCM discussed with patient's daughter Derrick Ravel patient's poor prognosis. I called [name] for follow up. She notes that patient would not want to live long term in a nursing facilty. I offered to discuss what transition to full comfort would look like. She wishes to speak to family members first and will call back with a time to meet and discuss transition to full comfort.   Review of Systems  Unable to perform ROS: Mental status change    Length of Stay: 32  Current Medications: Scheduled Meds:  . chlorhexidine gluconate (MEDLINE KIT)  15 mL Mouth Rinse BID  . Chlorhexidine Gluconate Cloth  6 each Topical Daily  . diphenoxylate-atropine  5 mL Per Tube QID  . feeding supplement (PROSource TF)  45 mL Per Tube TID  . fiber  1 packet Per Tube TID  . free water  200 mL Per Tube Q4H  . heparin injection (subcutaneous)  5,000 Units Subcutaneous Q8H  . hydrALAZINE  25 mg Per Tube Q8H  . insulin aspart  0-15 Units Subcutaneous Q8H  . levETIRAcetam  750 mg Per Tube BID  . loperamide HCl  4 mg Per Tube Q6H  . mouth rinse  15 mL Mouth Rinse 10 times per day  . Opium  4 drop Per Tube Q6H  . pantoprazole sodium  40 mg Per Tube QHS  . sodium chloride flush  10-40 mL Intracatheter Q12H  . sodium chloride flush  5 mL Intracatheter Q8H    Continuous Infusions: . sodium chloride Stopped (11/16/2020 1358)  . sodium chloride Stopped (12/14/20 1154)  .  ampicillin-sulbactam (UNASYN) IV 200 mL/hr at 12/14/20 1200  . feeding supplement (OSMOLITE 1.5 CAL) 1,000 mL (12/13/20 1400)  . TPN ADULT (ION) 100 mL/hr at 12/14/20 1200  . TPN ADULT (ION)      PRN Meds: sodium chloride, acetaminophen **OR** acetaminophen, fentaNYL (SUBLIMAZE) injection, fentaNYL (SUBLIMAZE) injection, hydrALAZINE, ondansetron (ZOFRAN) IV, sodium chloride flush  Physical Exam Vitals and nursing note reviewed.  Vital Signs: BP 139/66   Pulse 91   Temp 98.1 F (36.7 C) (Axillary)   Resp (!) 26   Ht 5' 11" (1.803 m)   Wt 64.1 kg   SpO2 100%   BMI 19.71 kg/m  SpO2: SpO2: 100 % O2 Device: O2 Device: Ventilator O2 Flow Rate: O2 Flow Rate (L/min): 15 L/min  Intake/output summary:   Intake/Output Summary (Last 24 hours) at 12/14/2020 1503 Last data filed at 12/14/2020 1229 Gross per 24 hour  Intake 5218.27 ml  Output 4660 ml  Net 558.27 ml   LBM: Last BM Date: 12/13/20 Baseline Weight: Weight: 57.6 kg Most recent weight: Weight: 64.1 kg       Palliative Assessment/Data: PPS: 10%    Flowsheet Rows   Flowsheet Row Most Recent Value  Intake Tab   Referral Department Critical care  Unit at Time of Referral ICU  Date Notified 11/25/20  Palliative Care Type New Palliative care  Reason for referral Clarify Goals of Care  Date of Admission 11/13/2020  Date first seen by Palliative Care 11/28/20  # of days Palliative referral response time 3 Day(s)  # of days IP prior to Palliative referral 13  Clinical Assessment   Psychosocial & Spiritual Assessment   Palliative Care Outcomes       Patient Active Problem List   Diagnosis Date Noted  . Pressure injury of skin 12/12/2020  . Pelvic abscess in male [facility])   . Encephalopathy acute   . Acute respiratory failure with hypoxia ([place])   . Shock ([place])   . Protein-calorie malnutrition, severe 11/21/2020  . Hypernatremia 11/13/2020  . AKI (acute kidney injury) ([place]) 11/13/2020  . Lactic acidosis  11/13/2020  . SBO (small bowel obstruction) ([place]) 11/20/2020  . Acute metabolic encephalopathy 20/09/711  . Seizure ([place]) 12/16/2019  . Polysubstance abuse ([place]) 12/16/2019  . History of stroke 12/16/2019  . Neurocognitive deficits 12/26/2018  . CVA (cerebral vascular accident) ([place]) 12/17/2018  . Cancer of glottis ([place]) 12/25/2017  . Alcohol use, unspecified with unspecified alcohol-induced disorder ([place]) 12/25/2017  . Stroke ([place]) 12/20/2017  . Decreased visual acuity 06/18/2015  . History of bladder cancer 04/24/2015  . History of UTI 03/27/2015  . UTI (urinary tract infection) 11/21/2014  . Fatigue 10/23/2014  . Hyperglycemia 10/23/2014  . Wellness examination 10/23/2014  . PVD (peripheral vascular disease) ([place]) 07/28/2014  . Anxiety and depression 07/28/2014  . Palpitations 06/21/2014  . Cough 09/26/2013  . Atherosclerosis of arteries 05/13/2013  . Smoking 05/03/2013  . Lesion of liver 04/01/2013  . Rhinitis 10/05/2012  . Chronic scapular pain 10/05/2012  . Anemia 09/17/2012  . Bone lesion 09/17/2012  . HTN (hypertension) 06/12/2012  . Trigeminal neuralgia 05/28/2012    Palliative Care Assessment & Plan   Patient Profile: 75 yo male with past medical history significant of bladder cancer s/p urostomy, seizure disorder, right glottic cancer s/p excisinon, stroke, vascular dementia, atrial fibrillation, tobacco abuse, hep C, anxiety, depression admitted 12/03/2020 with small bowel obstruction and ischemic bowel. Initially seen at [facility] but left AMA and came to [facility] following day. S/P exploratory laparoscopy and bowel resection 12/7, further bowel resection 12/12 and found to have bowel necrosis, and abd closure and ileostomy 12/15. Hospitalization complicated by recurrent respiratory failure and intubated x 3.Overall adult failure to thrive.Trach placed 12/29. Palliative assisting with goals of care.  Assessment/Recommendations/Plan  Derrick Ravel shares that  patient would not want to reside long term in a facility She is discussing with family and  will call back- PMT hopes to meet with family tomorrow   Goals of Care and Additional Recommendations: Limitations on Scope of Treatment: Full Scope Treatment  Code Status: DNR  Prognosis:  Unable to determine  Discharge Planning: To Be Determined  Care plan was discussed with patient's daughterDerrick Ravel.   Thank you for allowing the Palliative Medicine Team to assist in the care of this patient.   Total time: 27 mins  Greater than 50%  of this time was spent counseling and coordinating care related to the above assessment and plan.  Mariana Kaufman, AGNP-C Palliative Medicine   Please contact Palliative Medicine Team phone at [phone] for questions and concerns.

## 2020-12-14 NOTE — Progress Notes (Signed)
PHARMACY - TOTAL PARENTERAL NUTRITION CONSULT NOTE  Indication: Small bowel obstruction, prolonged ileus  Patient Measurements: Height: 5\' 11"  (180.3 cm) Weight: 61.2 kg (134 lb 14.7 oz) IBW/kg (Calculated) : 75.3 TPN AdjBW (KG): 60.8 Body mass index is 18.82 kg/m. Usual Weight: 137 lbs  Assessment:  74 YOM presented 11/22/2020 with nausea, vomiting, abdominal pain after leaving AMA at [facility] with known SBO. Patient has been on bowel rest with NG tube decompression. Patient continued to have persistent SBO on imaging with no evidence of improvement, now s/p ex-lap with LoA, SBR and placement of Gtube on 12/01/2020. Pharmacy consulted for TPN management - patient received TPN 11/19/20 through 11/29/20.  Reconsulted to resume TPN on 12/21 despite tube feeds at goal rate due to high ostomy output and concern for tube feeds not absorbing. Patient on loperamide and fiber without improvement in output.   Glucose / Insulin: no hx DM - CBGs up 1/1 PM - unclear reasoning - has previously been stable - will watch.. CBGs back down to <180 this AM  - utilized 13 units SSI/24hrs Electrolytes: K 4.5, Na 137, Phos 3.7, Mg 2.1, HCO3 21 Renal: Scr 0.93 << 1.31, BUN up 44 LFTs / TGs: LFTs and TG wnl, Tbili down 0.8 (1/2) Prealbumin / albumin: Prealbumin down 7.7 (12/27), alb 1.5 (1/2) Intake / Output; MIVF: UOP 2.5 ml/kg/hr, ileostomy output/24h: 1225 (on fiber, opium tincture, loperamide and unlikely to improve per Surgery), drain O/P 37ml, net -1.5L GI Imaging:  12/11 DG Abd - stomach gas-filled, without distention, no obvious free air 12/23 CT abd pelvis - large loculated collection in low abd/pelvis, few foci of internal gas present, new RUQ ileostomy, thickened distal SB/colon, subtotal atelectatic collapse of LLL, partial collapse of RLL, small R pleural effusion, body wall edema Surgeries / Procedures: 12/12: re-exploration and washout with new copious drainage from incision concerning for enteric leak  vs. enterotomy >> now s/p, bowel necrosis resected 12/15: ex-lap with ileostomy with fistula creation, abdominal closure 12/24 Pelvic abscess drain 12/29 - trach placed  Central access: PICC placed 11/14/2020 TPN start date: 11/19/20>>11/29/20; restart 12/03/20 >>  Nutritional Goals (updated per RD rec 12/30): 2300-2700 kCal, 120-135g protein, >2L fluid per day  Goal TPN rate of 100 cc/hr will provide 120g AA, 432g CHO, and 36g lipids (15% of total kCal due to 1/31) for a total of 2308 kcal, meeting 100% of needs  Current Nutrition:  TPN Osmolite 1.5 at 30 ml/hr (goal rate 65 ml/hr) - not advancing rate d/t ileostomy output ProSource TF 45 ml TID (each 11g AA and 40 kcal)  Plan:  Continue TPN at 100 ml/hr at 1800  TPN will provide 120g protein, 432g CHO, 36g SMOF lipids (~15% of total kcal due to [employer]), and 2308 kcal meeting 100% of estimated needs Electrolytes in TPN: cont Na 20 mEq/L, red K back to 40 mEq/L, red Ca to 3 mEq/L, cont Mg 7 mEq/L, cont Phos 21mmol/L, cont max acetate Add standard MVI and trace elements + folic acid   Continue moderate SSI to q8h and adjust as needed F/u TPN labs Mon/Thurs F/u TF tolerability with rate increase, ileostomy output, and ability to wean TPN    Thank you for allowing pharmacy to be a part of this patient's care.  12m, PharmD, BCPS Clinical Pharmacist Clinical phone for 12/14/2020: [phone] 12/14/2020 7:22 AM   **Pharmacist phone directory can now be found on [email or web address] ([email or web address]).  Listed under [facility].

## 2020-12-15 DIAGNOSIS — G9340 Encephalopathy, unspecified: Secondary | ICD-10-CM | POA: Diagnosis not present

## 2020-12-15 DIAGNOSIS — I10 Essential (primary) hypertension: Secondary | ICD-10-CM | POA: Diagnosis not present

## 2020-12-15 DIAGNOSIS — J9601 Acute respiratory failure with hypoxia: Secondary | ICD-10-CM | POA: Diagnosis not present

## 2020-12-15 DIAGNOSIS — N179 Acute kidney failure, unspecified: Secondary | ICD-10-CM | POA: Diagnosis not present

## 2020-12-15 LAB — CBC WITH DIFFERENTIAL/PLATELET
Abs Immature Granulocytes: UNDETERMINED 10*3/uL (ref 0.00–0.07)
Abs Immature Granulocytes: 1.23 10*3/uL — ABNORMAL HIGH (ref 0.00–0.07)
Band Neutrophils: UNDETERMINED %
Basophils Absolute: UNDETERMINED 10*3/uL (ref 0.0–0.1)
Basophils Absolute: 0.1 10*3/uL (ref 0.0–0.1)
Basophils Relative: UNDETERMINED %
Basophils Relative: 0 %
Blasts: UNDETERMINED %
Eosinophils Absolute: UNDETERMINED 10*3/uL (ref 0.0–0.5)
Eosinophils Absolute: 0 10*3/uL (ref 0.0–0.5)
Eosinophils Relative: UNDETERMINED %
Eosinophils Relative: 0 %
HCT: UNDETERMINED % (ref 39.0–52.0)
HCT: 23.8 % — ABNORMAL LOW (ref 39.0–52.0)
Hemoglobin: UNDETERMINED g/dL (ref 13.0–17.0)
Hemoglobin: 7.2 g/dL — ABNORMAL LOW (ref 13.0–17.0)
Immature Granulocytes: UNDETERMINED %
Immature Granulocytes: 4 %
Lymphocytes Relative: UNDETERMINED %
Lymphocytes Relative: 7 %
Lymphs Abs: UNDETERMINED 10*3/uL (ref 0.7–4.0)
Lymphs Abs: 2.6 10*3/uL (ref 0.7–4.0)
MCH: UNDETERMINED pg (ref 26.0–34.0)
MCH: 30 pg (ref 26.0–34.0)
MCHC: UNDETERMINED g/dL (ref 30.0–36.0)
MCHC: 30.3 g/dL (ref 30.0–36.0)
MCV: UNDETERMINED fL (ref 80.0–100.0)
MCV: 99.2 fL (ref 80.0–100.0)
Metamyelocytes Relative: UNDETERMINED %
Monocytes Absolute: UNDETERMINED 10*3/uL (ref 0.1–1.0)
Monocytes Absolute: 2.5 10*3/uL — ABNORMAL HIGH (ref 0.1–1.0)
Monocytes Relative: UNDETERMINED %
Monocytes Relative: 7 %
Myelocytes: UNDETERMINED %
Neutro Abs: UNDETERMINED 10*3/uL (ref 1.7–7.7)
Neutro Abs: 29.1 10*3/uL — ABNORMAL HIGH (ref 1.7–7.7)
Neutrophils Relative %: UNDETERMINED %
Neutrophils Relative %: 82 %
Other: UNDETERMINED %
Platelets: UNDETERMINED 10*3/uL (ref 150–400)
Platelets: 539 10*3/uL — ABNORMAL HIGH (ref 150–400)
Promyelocytes Relative: UNDETERMINED %
RBC Morphology: UNDETERMINED
RBC: UNDETERMINED MIL/uL (ref 4.22–5.81)
RBC: 2.4 MIL/uL — ABNORMAL LOW (ref 4.22–5.81)
RDW: UNDETERMINED % (ref 11.5–15.5)
RDW: 18.7 % — ABNORMAL HIGH (ref 11.5–15.5)
Smear Review: UNDETERMINED
WBC Morphology: UNDETERMINED
WBC: UNDETERMINED 10*3/uL (ref 4.0–10.5)
WBC: 35.4 10*3/uL — ABNORMAL HIGH (ref 4.0–10.5)
nRBC: UNDETERMINED % (ref 0.0–0.2)
nRBC: UNDETERMINED /100 WBC
nRBC: 0.1 % (ref 0.0–0.2)

## 2020-12-15 LAB — COMPREHENSIVE METABOLIC PANEL
ALT: 33 U/L (ref 0–44)
AST: 35 U/L (ref 15–41)
Albumin: 1.7 g/dL — ABNORMAL LOW (ref 3.5–5.0)
Alkaline Phosphatase: 116 U/L (ref 38–126)
Anion gap: 9 (ref 5–15)
BUN: 47 mg/dL — ABNORMAL HIGH (ref 8–23)
CO2: 22 mmol/L (ref 22–32)
Calcium: 8.4 mg/dL — ABNORMAL LOW (ref 8.9–10.3)
Chloride: 107 mmol/L (ref 98–111)
Creatinine, Ser: 0.92 mg/dL (ref 0.61–1.24)
GFR, Estimated: >60 mL/min (ref >60)
Glucose, Bld: 199 mg/dL — ABNORMAL HIGH (ref 70–99)
Potassium: 4.7 mmol/L (ref 3.5–5.1)
Sodium: 138 mmol/L (ref 135–145)
Total Bilirubin: 1.1 mg/dL (ref 0.3–1.2)
Total Protein: 6.5 g/dL (ref 6.5–8.1)

## 2020-12-15 LAB — TRIGLYCERIDES
Triglycerides: UNDETERMINED mg/dL (ref <150)
Triglycerides: 65 mg/dL (ref <150)

## 2020-12-15 LAB — CALCIUM, IONIZED: Calcium, Ionized, Serum: 4.7 mg/dL (ref 4.5–5.6)

## 2020-12-15 LAB — MAGNESIUM: Magnesium: 2.3 mg/dL (ref 1.7–2.4)

## 2020-12-15 LAB — PREALBUMIN
Prealbumin: UNDETERMINED mg/dL (ref 18–38)
Prealbumin: 17.9 mg/dL — ABNORMAL LOW (ref 18–38)

## 2020-12-15 LAB — PHOSPHORUS: Phosphorus: 3.4 mg/dL (ref 2.5–4.6)

## 2020-12-15 LAB — GLUCOSE, CAPILLARY
Glucose-Capillary: 174 mg/dL — ABNORMAL HIGH (ref 70–99)
Glucose-Capillary: 187 mg/dL — ABNORMAL HIGH (ref 70–99)
Glucose-Capillary: 188 mg/dL — ABNORMAL HIGH (ref 70–99)

## 2020-12-15 MED ORDER — HYDRALAZINE HCL 50 MG PO TABS
75.0000 mg | ORAL_TABLET | Freq: Three times a day (TID) | ORAL | Status: DC
  Administered 2020-12-15 – 2020-12-16 (×3): 75 mg
  Filled 2020-12-15 (×3): qty 1

## 2020-12-15 MED ORDER — CARVEDILOL 3.125 MG PO TABS: 6.2500 mg | ORAL_TABLET | Freq: Two times a day (BID) | ORAL | Status: DC

## 2020-12-15 MED ORDER — TRAVASOL 10 % IV SOLN
INTRAVENOUS | Status: AC
  Filled 2020-12-15: qty 1200

## 2020-12-15 NOTE — Progress Notes (Signed)
NAME:  Ronald Wolf, MRN:  [id number], DOB:  08-Jan-1946, LOS: 33 ADMISSION DATE:  12/02/2020, CONSULTATION DATE:  12/7 REFERRING MD:  Ronald Wolf, CHIEF COMPLAINT:  Abdominal pain   Brief History:  Ronald Wolf is a 75 yo M w/ PMH of CVA, Seizures, Dementia, Bladder ca s/p urostomy, tobacco use, right glottic carcinoma s/p excision presenting with sbo. Found to have ischemic bowel requiring ex-lap 12/7.  Course complicated by septic shock, acute respiratory failure and AKI return to the OR 12/12 for necrotic small bowel resection of additional 70 cm inclusive of prior anastomosis 12/15 abdominal closure with ileostomy He has had a prolonged ICU stay with ventilator dependence, had a tracheostomy on 12/29  Past Medical History:  CVA, Dementia, Bladder CA s/p nephrostomy, tobacco use, right glottic carcinoma s/p excision, seizures.  Significant Hospital Events:  12/09/2020 Admission 11/27/2020 OR November 27, 2020 Admit to ICU 12/10 Brief SVT with associated hypertension. Self limited Given fentanyl, metop and hydral for HTN 12/11 Good urine output with Lasix , failed extubation, reintubated within 2 hours due to inability to clear secretions 12/12 return OR for enteric leak-- found to have bowel necrosis. Left in discontinuity  12/13 self extubated, reintubated. On pressors. Family meeting with PCCM and CCS to discuss possible next steps.  12/15-was back in OR, abdominal closure, RUQ mucus fistula and L ileostomy  12/19: No significant overnight events, high ileostomy output 12/21: scheduled for family meeting. Worse hypernatremia, continued high ileostomy output.  Attempted PSV/CPAP wean, pulled volumes of high 100s -low/mid 200s with tachypnea. Placed back of full support.  12/22 ongoing high output GI/ ileostomy, TPN started; weaned 12/5 all day; off fentanyl gtt, remains on precedex  12/24 IR drain , 1 unit PRBC transfusion for hemoglobin 6.5 12/26 added vancomycin for GPC in blood cultures and RIC suggesting  MRSA 12/29 tracheostomy 12/31 Palliative discussion > family understanding that his situation is critical and he has not made much improvement    Consults:  General Surgery PCCM  Procedures:  11/27/2020 Ex-lap, G-tube placement ETT 12/7 >> 12/11, >> 12/15- back to OR for abdominal closure, exploratory laparotomy  12/24 CT-guided right pelvic abscess drain 12/29 trach  Significant Diagnostic Tests:  CT abd/pelvis 12/2 > High-grade distal small bowel obstruction, possibly due to adhesion. Mild ascites and diffuse mesenteric edema. No evidence of focal inflammatory process or abscess. Small bilateral pleural effusions and mild right lower lobe Atelectasis. CT abdomen/pelvis 12/23 Interval development of a large loculated collection in the low anterior abdomen and pelvis measuring approximately 6.1 x 11.3 x12.1 cm in size subjacent to the operative site. Few foci of internal gas are present. , no frank spillage of enteric contrast   Micro Data:  12/12/2020 COVID/Flu negative 12/7 BA: Few GNR rare GPC -- predominantly PMN  12/7 BCx> Neg 12/11 respiratory -no organism in tracheal aspirate 12/24 BC >> RIC showing both staph epidermis 1/4 12/24 abscess >> E. Faecalis, B fragilis (B lactamase) 12/27 trach aspirate>> normal flora  Antimicrobials:  Fluc 12/8>> 12/10 Zosyn 12/8> 12/12 cefotetain 12/15>> off Zosyn 12/23 >> 2/30 eraxis 12/24 >> 1/2 Vancomycin 12/26  Unasyn 12/31 >  Interim History / Subjective:  Recurrent episodes of bradycardia/tachycardia and hypertension Placed on pressure support trial, has been tolerating well so far  Objective   Blood pressure (!) 179/77, pulse 100, temperature 97.6 F (36.4 C), temperature source Oral, resp. rate (!) 24, height 5\' 11"  (1.803 m), weight 64.1 kg, SpO2 96 %.    Vent Mode: CPAP;PSV FiO2 (%):  [40 %-  50 %] 50 % Set Rate:  [14 bmp] 14 bmp Vt Set:  [600 mL] 600 mL PEEP:  [5 cmH20] 5 cmH20 Pressure Support:  [8 cmH20] 8 cmH20 Plateau  Pressure:  [19 cmH20-21 cmH20] 21 cmH20   Intake/Output Summary (Last 24 hours) at 12/15/2020 0946 Last data filed at 12/15/2020 0700 Gross per 24 hour  Intake 4658.13 ml  Output 4340 ml  Net 318.13 ml   Filed Weights   12/12/20 0500 12/13/20 0500 12/14/20 0500  Weight: 65.9 kg 65.8 kg 64.1 kg    Examination: General: Elderly African-American male, s/p trach on vent HENT: Normocephalic, atraumatic, tracheostomy in place PULM: CTA B, vent supported breathing, no wheezes CV: RRR, no murmur GI: BS+, soft, nontender MSK: normal bulk and tone Neuro: awake, following commands intermittently, moving all 4 extremities Skin: No rash  Resolved Hospital Problem list     Assessment & Plan:   Sepsis due to intraabdominal abscess: Due to Enterococcus faecalis Now afebrile but remained tachycardic and tachypneic Continue Unasyn continue amp-sulbactam  Acute encephalopathy, multifactorial Mental status is somewhat better, now following commands Continue supportive care Continue delirium precautions  Acute hypoxemic respiratory failure s/p trach requiring mechanical ventilation Continue mechanical ventilatory support, placed on pressure support trial this morning VAP prevention Elevate head and to 30 degrees  Bilateral pleural effusions due to profound malnutrition/hypoalbuminemia Pleural effusion remained stable Continue to monitor  SBO Ischemic bowel perforation, s/p 2 ex-lap for resection of ischemic bowel, mucus fistula and ileostomy, G tube placement Intraabdominal absces s/p IR drain High output ileostomy with bicarbonate wasting Severe Protein calorie malnutrition Continue TPN/enteral supplement NPO Monitor stool output, on loperamide  Acute kidney injury, likely prerenal Serum creatinine has improved Monitor BMET and UOP Replace electrolytes as needed   Hypertension, uncontrolled Tachybradycardia syndrome Patient is frequently going into asymptomatic bradycardia  with heart rate going down to 30s, and sometimes is 120s Avoid beta-blocker EKG was done which showed fascicular block We will discuss with family about goals of care before calling cardiology  Atrial fibrillation with RVR> resolved Not on anticoagulation because of recent surgery  Seizure No more seizures Continue Keppra  Bladder Cancer  Anemia, no obvious source of ongoing blood loss Monitor for bleeding Transfuse PRBC for Hgb < 7 gm/dL  Best practice (evaluated daily)  Diet: tube feeding Pain/Anxiety/Delirium protocol (if indicated): minimize sedation VAP protocol (if indicated): yes DVT prophylaxis: Subcu heparin GI prophylaxis: Pantoprazole for stress ulcer prophylaxis Glucose control: SSI Mobility: As tolerated Disposition:full  Goals of Care:  Last date of multidisciplinary goals of care discussion: 1/2  Family and staff present: McQuaid, Daughter Summary of discussion: I updated [name].  She plans to update the rest of the family and try to have a meeting of all stakeholders in the next 24 hours. I stated that currently it appears that he would need long term inpatient care for the rest of his life. Code Status: limited code blue no shock, no CPR  Labs   CBC: Recent Labs  Lab 12/10/20 0500 12/15/20 0605 12/15/20 0813  WBC 25.4* SPECIMEN CONTAMINATED, UNABLE TO PERFORM TEST(S). 35.4*  NEUTROABS  --  SPECIMEN CONTAMINATED, UNABLE TO PERFORM TEST(S). 29.1*  HGB 7.1* SPECIMEN CONTAMINATED, UNABLE TO PERFORM TEST(S). 7.2*  HCT 22.9* SPECIMEN CONTAMINATED, UNABLE TO PERFORM TEST(S). 23.8*  MCV 95.4 SPECIMEN CONTAMINATED, UNABLE TO PERFORM TEST(S). 99.2  PLT 327 SPECIMEN CONTAMINATED, UNABLE TO PERFORM TEST(S). [id number]*    Basic Metabolic Panel: Recent Labs  Lab 12/11/20 0329 12/12/20 0533 12/12/20 1625  12/13/20 0524 12/14/20 0226 12/15/20 0813  NA 139  --  134* 143 137 138  K 3.5  --  4.4 3.4* 4.5 4.7  CL 92*  --  100 111 107 107  CO2 37*  --  25 19* 21* 22   GLUCOSE 139*  --  153* 117* 161* 199*  BUN 46*  --  47* 37* 44* 47*  CREATININE 1.04  --  1.17 1.31* 0.93 0.92  CALCIUM 6.9*  --  7.7* 6.2* 8.0* 8.4*  MG 1.9 2.1  --  1.7 2.1 2.3  PHOS 2.8 2.9  --  2.3* 3.7 3.4   GFR: Estimated Creatinine Clearance: 63.9 mL/min (by C-G formula based on SCr of 0.92 mg/dL). Recent Labs  Lab 12/10/20 0500 12/15/20 0605 12/15/20 0813  WBC 25.4* SPECIMEN CONTAMINATED, UNABLE TO PERFORM TEST(S). 35.4*    Liver Function Tests: Recent Labs  Lab 12/11/20 0329 12/14/20 0226 12/15/20 0813  AST 22 31 35  ALT 19 27 33  ALKPHOS 79 107 116  BILITOT 0.9 0.8 1.1  PROT 5.3* 6.0* 6.5  ALBUMIN 1.3* 1.5* 1.7*   No results for input(s): LIPASE, AMYLASE in the last 168 hours. No results for input(s): AMMONIA in the last 168 hours.  ABG    Component Value Date/Time   PHART 7.361 12/07/2020 2113   PCO2ART 47.6 11/12/2020 2113   PO2ART 41 (L) 12/11/2020 2113   HCO3 9.4 (L) 12/08/2020 0822   TCO2 28 12/05/2020 2113   ACIDBASEDEF 19.3 (H) 12/08/2020 0822   O2SAT 78.5 12/08/2020 0822     Coagulation Profile: No results for input(s): INR, PROTIME in the last 168 hours.  Cardiac Enzymes: No results for input(s): CKTOTAL, CKMB, CKMBINDEX, TROPONINI in the last 168 hours.  HbA1C: Hgb A1c MFr Bld  Date/Time Value Ref Range Status  11/19/2020 10:37 AM 5.0 4.8 - 5.6 % Final    Comment:    (NOTE) Pre diabetes:          5.7%-6.4%  Diabetes:              >6.4%  Glycemic control for   <7.0% adults with diabetes   12/17/2019 08:19 AM 4.5 (L) 4.8 - 5.6 % Final    Comment:    (NOTE) Pre diabetes:          5.7%-6.4% Diabetes:              >6.4% Glycemic control for   <7.0% adults with diabetes     CBG: Recent Labs  Lab 12/13/20 2325 12/14/20 0909 12/14/20 1554 12/14/20 2349 12/15/20 0806  GLUCAP 261* 134* 134* 182* 174*    Total critical care time: 48 minutes  Performed by: [place] care time was exclusive of separately  billable procedures and treating other patients.   Critical care was necessary to treat or prevent imminent or life-threatening deterioration.   Critical care was time spent personally by me on the following activities: development of treatment plan with patient and/or surrogate as well as nursing, discussions with consultants, evaluation of patient's response to treatment, examination of patient, obtaining history from patient or surrogate, ordering and performing treatments and interventions, ordering and review of laboratory studies, ordering and review of radiographic studies, pulse oximetry and re-evaluation of patient's condition.   Jacky Kindle MD [facility] Pager: [phone] Mobile: [phone]

## 2020-12-15 NOTE — Progress Notes (Signed)
PT Cancellation Note  Patient Details Name: Ronald Wolf MRN: [id number] DOB: [birth year]   Cancelled Treatment:    Reason Eval/Treat Not Completed: Medical issues which prohibited therapy. Pt with episode of bradycardia this morning, and RN asked PT to hold until pt more stable. Will continue to follow and treat as appropriate.   [name], DPT   Acute Rehabilitation Department Pager #: [phone]   [name] 12/15/2020, 8:47 AM

## 2020-12-15 NOTE — Progress Notes (Signed)
Progress Note: General Surgery Service   Chief Complaint/Subjective: Continued palliative discussions yesterday  Objective: Vital signs in last 24 hours: Temp:  [97.6 F (36.4 C)-98.7 F (37.1 C)] 97.6 F (36.4 C) (01/03 0800) Pulse Rate:  [43-123] 102 (01/03 0850) Resp:  [19-31] 27 (01/03 0850) BP: (133-194)/(57-86) 186/57 (01/03 0800) SpO2:  [87 %-100 %] 99 % (01/03 0850) FiO2 (%):  [40 %-50 %] 50 % (01/03 0756) Last BM Date: 12/14/20 (continuous- ostomy/ileostomy)  Intake/Output from previous day: 01/02 0701 - 01/03 0700 In: 9169.4 [I.V.:2613.3; NG/GT:1950; IV Piggyback:399.9] Out: 4340 [Urine:3150; Drains:40; Stool:1150] Intake/Output this shift: No intake/output data recorded.  Gen: intubated, eyes open  Resp: assisted with ventilator, trach in position  Card: tachycardic  Abd: left ostomy with liquid output, 2 mucous fistulas right side with minimal output, g tube in position  Lab Results: CBC  Recent Labs    12/15/20 0605  WBC SPECIMEN CONTAMINATED, UNABLE TO PERFORM TEST(S).  HGB SPECIMEN CONTAMINATED, UNABLE TO PERFORM TEST(S).  HCT SPECIMEN CONTAMINATED, UNABLE TO PERFORM TEST(S).  PLT SPECIMEN CONTAMINATED, UNABLE TO PERFORM TEST(S).   BMET Recent Labs    12/13/20 0524 12/14/20 0226  NA 143 137  K 3.4* 4.5  CL 111 107  CO2 19* 21*  GLUCOSE 117* 161*  BUN 37* 44*  CREATININE 1.31* 0.93  CALCIUM 6.2* 8.0*   PT/INR No results for input(s): LABPROT, INR in the last 72 hours. ABG No results for input(s): PHART, HCO3 in the last 72 hours.  Invalid input(s): PCO2, PO2  Anti-infectives: Anti-infectives (From admission, onward)   Start     Dose/Rate Route Frequency Ordered Stop   12/12/20 1800  Ampicillin-Sulbactam (UNASYN) 3 g in sodium chloride 0.9 % 100 mL IVPB        3 g 200 mL/hr over 30 Minutes Intravenous Every 6 hours 12/12/20 1101     12/08/20 1300  vancomycin (VANCOCIN) IVPB 1000 mg/200 mL premix  Status:  Discontinued        1,000  mg 200 mL/hr over 60 Minutes Intravenous Every 24 hours 12/08/20 0845 12/12/20 1059   12/08/20 0100  vancomycin (VANCOREADY) IVPB 500 mg/100 mL  Status:  Discontinued        500 mg 100 mL/hr over 60 Minutes Intravenous Every 12 hours 12/07/20 1130 12/07/20 1337   12/07/20 1215  vancomycin (VANCOREADY) IVPB 1250 mg/250 mL        1,250 mg 166.7 mL/hr over 90 Minutes Intravenous  Once 12/07/20 1124 12/07/20 1336   12/06/20 1030  anidulafungin (ERAXIS) 100 mg in sodium chloride 0.9 % 100 mL IVPB  Status:  Discontinued       "Followed by" Linked Group Details   100 mg 78 mL/hr over 100 Minutes Intravenous Every 24 hours 12/05/20 0939 12/13/20 1230   12/05/20 1030  anidulafungin (ERAXIS) 200 mg in sodium chloride 0.9 % 200 mL IVPB       "Followed by" Linked Group Details   200 mg 78 mL/hr over 200 Minutes Intravenous  Once 12/05/20 0939 12/05/20 1618   12/04/20 1500  piperacillin-tazobactam (ZOSYN) IVPB 3.375 g  Status:  Discontinued        3.375 g 12.5 mL/hr over 240 Minutes Intravenous Every 8 hours 12/04/20 0818 12/12/20 1059   12/04/20 0915  piperacillin-tazobactam (ZOSYN) IVPB 3.375 g        3.375 g 100 mL/hr over 30 Minutes Intravenous  Once 12/04/20 0818 12/04/20 1207   12/09/2020 0945  cefoTEtan (CEFOTAN) 2 g in sodium chloride 0.9 %  100 mL IVPB  Status:  Discontinued        2 g 200 mL/hr over 30 Minutes Intravenous To Surgery 12/11/2020 0936 11/19/2020 1057   11/29/2020 0900  cefoTEtan (CEFOTAN) 2 g in sodium chloride 0.9 % 100 mL IVPB  Status:  Discontinued        2 g 200 mL/hr over 30 Minutes Intravenous  Once 11/22/2020 0833 12/06/2020 0936   11/19/20 1130  piperacillin-tazobactam (ZOSYN) IVPB 3.375 g        3.375 g 12.5 mL/hr over 240 Minutes Intravenous Every 8 hours 11/19/20 1017 12/11/2020 2359   11/19/20 1115  fluconazole (DIFLUCAN) IVPB 200 mg        200 mg 100 mL/hr over 60 Minutes Intravenous Every 24 hours 11/19/20 1017 11/21/20 1104   11/19/20 0915  Ampicillin-Sulbactam (UNASYN) 3  g in sodium chloride 0.9 % 100 mL IVPB  Status:  Discontinued        3 g 200 mL/hr over 30 Minutes Intravenous Every 12 hours 11/19/20 0826 11/19/20 0949      Medications: Scheduled Meds: . chlorhexidine gluconate (MEDLINE KIT)  15 mL Mouth Rinse BID  . Chlorhexidine Gluconate Cloth  6 each Topical Daily  . diphenoxylate-atropine  5 mL Per Tube QID  . feeding supplement (PROSource TF)  45 mL Per Tube TID  . fiber  1 packet Per Tube TID  . free water  200 mL Per Tube Q4H  . heparin injection (subcutaneous)  5,000 Units Subcutaneous Q8H  . hydrALAZINE  75 mg Per Tube Q8H  . insulin aspart  0-15 Units Subcutaneous Q8H  . levETIRAcetam  750 mg Per Tube BID  . loperamide HCl  4 mg Per Tube Q6H  . mouth rinse  15 mL Mouth Rinse 10 times per day  . Opium  4 drop Per Tube Q6H  . pantoprazole sodium  40 mg Per Tube QHS  . sodium chloride flush  10-40 mL Intracatheter Q12H  . sodium chloride flush  5 mL Intracatheter Q8H   Continuous Infusions: . sodium chloride Stopped (11/19/2020 1358)  . sodium chloride 10 mL/hr at 12/15/20 0700  . ampicillin-sulbactam (UNASYN) IV Stopped (12/15/20 9233)  . feeding supplement (OSMOLITE 1.5 CAL) 1,000 mL (12/13/20 1400)  . TPN ADULT (ION) 100 mL/hr at 12/15/20 0700   PRN Meds:.sodium chloride, acetaminophen **OR** acetaminophen, fentaNYL (SUBLIMAZE) injection, hydrALAZINE, ondansetron (ZOFRAN) IV, sodium chloride flush  Assessment/Plan: s/p Procedure(s): EXPLORATORY LAPAROTOMY AND ABDOMINAL CLOSURE ILEOSTOMY WITH MUCOUS FISTULA CREATION 11/30/2020  SBO -s/p ex lap with SBR, placement of a gastrostomy tube12/7 by Dr. Georgette Dover. Takeback 12/12 by Dr. Zenia Resides for necrotic SB with resection of additional 60-70cm of SB inclusive of prior anastomosis in discontinuity with open abdomen.  - S/P ex lap, ileostomy, mucous fistula, closure 12/15 by Dr. [place] Silos Malnourishment/FEN- Continue TNA. Keep feeds at 30 today given high ileostomy output. Continue fiber and  imodium, lomotil, tincture of opium. 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 looking at mucous fistulas, I do not think there is a way to feed/refeed distally to improve absoprtion VDRF- self-extubated 12/13 AM and 12/28, re-intubated, trach placed. Per CCM Shock/ID- shockresolved, off pressors;necrotic bowel was most likely source.  Intra-abdominal abscess - seen on CT 12/23,s/p perc drain by IR 12/24. Culture with Enterococcus faecalis and Bacteroides fragilis, beta lactamase positive.  CV/AF RVR- in NSRcurrently  VTE-SQH Dispo - ICU.     LOS: 33 days   Mickeal Skinner, MD [facility]

## 2020-12-15 NOTE — Consult Note (Signed)
WOC Nurse ostomy follow up Patient receiving care in [facility] 4N30. Urostomy stoma has a urostomy pouch connected to bedside drainage bag. Ileostomy has a high output pouch connected to bedside drainage bag. Neither with leakage, nor impending leakage noted. The RUQ mucus fistula has been pouched with a urostomy pouch.  I am not sure when this was done, or why.  The stoma can be managed with dry gauze or foam dressing. Detailed instructions remain posted in room over supplies.  Helmut Muster, RN, MSN, CWOCN, CNS-BC, pager [phone]

## 2020-12-15 NOTE — Progress Notes (Signed)
PHARMACY - TOTAL PARENTERAL NUTRITION CONSULT NOTE  Indication: Small bowel obstruction, prolonged ileus  Patient Measurements: Height: 5\' 11"  (180.3 cm) Weight: 61.2 kg (134 lb 14.7 oz) IBW/kg (Calculated) : 75.3 TPN AdjBW (KG): 60.8 Body mass index is 18.82 kg/m. Usual Weight: 137 lbs  Assessment:  Ronald Wolf presented 12/11/2020 with nausea, vomiting, abdominal pain after leaving AMA at [facility] with known SBO. Patient has been on bowel rest with NG tube decompression. Patient continued to have persistent SBO on imaging with no evidence of improvement, now s/p ex-lap with LoA, SBR and placement of Gtube on 11/28/2020. Pharmacy consulted for TPN management - patient received TPN 11/19/20 through 11/29/20.  Reconsulted to resume TPN on 12/2. TF currently at 30 mL/hr given high ostomy output and concern for tube feeds not absorbing. Patient on loperamide, lomotil, tincture of opium and fiber without improvement in output.   Glucose / Insulin: no hx DM - CBGs borderline at at 174-182 this AM  - utilized 11 units SSI/24hrs Electrolytes: K up to 4.7, Na 138, Phos 3.4, Mg 2.3, HCO3 22 Renal: Scr 0.92, BUN up 47 LFTs / TGs: LFTs and TG wnl, Tbili 1.1 (132) Prealbumin / albumin: Prealbumin 7.7 > 17.9 (12/27), alb 1.7 (1/2) Intake / Output; MIVF: UOP 2 ml/kg/hr, ileostomy output/24h: 1.1L (on fiber, opium tincture, loperamide and unlikely to improve per Surgery), drain O/P 72ml, net +628 mL yesterday GI Imaging:  12/11 DG Abd - stomach gas-filled, without distention, no obvious free air 12/23 CT abd pelvis - large loculated collection in low abd/pelvis, few foci of internal gas present, new RUQ ileostomy, thickened distal SB/colon, subtotal atelectatic collapse of LLL, partial collapse of RLL, small R pleural effusion, body wall edema Surgeries / Procedures: 12/12: re-exploration and washout with new copious drainage from incision concerning for enteric leak vs. enterotomy >> now s/p, bowel necrosis  resected 12/15: ex-lap with ileostomy with fistula creation, abdominal closure 12/24 Pelvic abscess drain 12/29 - trach placed  Central access: PICC placed 12/07/2020 TPN start date: 11/19/20>>11/29/20; restart 12/03/20 >>  Nutritional Goals (updated per RD rec 12/30): 2300-2700 kCal, 120-135g protein, >2L fluid per day  Goal TPN rate of 100 cc/hr will provide 120g AA, 432g CHO, and 36g lipids (15% of total kCal due to 1/31) for a total of 2308 kcal, meeting 100% of needs  Current Nutrition:  TPN Osmolite 1.5 at 30 ml/hr (goal rate 65 ml/hr) - not advancing rate d/t ileostomy output ProSource TF 45 ml TID (each 11g AA and 40 kcal)  Plan:  Continue TPN at 100 ml/hr at 1800  TPN will provide 120g protein, 432g CHO, 36g SMOF lipids (~15% of total kcal due to [employer]), and 2308 kcal meeting 100% of estimated needs Electrolytes in TPN: cont Na 20 mEq/L, reduce K to 30 mEq/L, continue Ca 3 mEq/L, Mg 7 mEq/L, Phos 92mmol/L, max acetate Add standard MVI and trace elements + folic acid   Continue moderate SSI to q8h and adjust as needed F/u TPN labs Mon/Thurs Per surgery, 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 Depending on GOC discussion, will consider cycling TPN  Thank you for allowing pharmacy to be a part of this patient's care.  12m, PharmD., BCPS, BCCCP Clinical Pharmacist Please refer to [facility] for unit-specific pharmacist

## 2020-12-16 DIAGNOSIS — K651 Peritoneal abscess: Secondary | ICD-10-CM | POA: Diagnosis not present

## 2020-12-16 DIAGNOSIS — K56609 Unspecified intestinal obstruction, unspecified as to partial versus complete obstruction: Secondary | ICD-10-CM | POA: Diagnosis not present

## 2020-12-16 DIAGNOSIS — J9601 Acute respiratory failure with hypoxia: Secondary | ICD-10-CM | POA: Diagnosis not present

## 2020-12-16 DIAGNOSIS — G9340 Encephalopathy, unspecified: Secondary | ICD-10-CM | POA: Diagnosis not present

## 2020-12-16 DIAGNOSIS — E43 Unspecified severe protein-calorie malnutrition: Secondary | ICD-10-CM | POA: Diagnosis not present

## 2020-12-16 DIAGNOSIS — Z515 Encounter for palliative care: Secondary | ICD-10-CM | POA: Diagnosis not present

## 2020-12-16 DIAGNOSIS — N179 Acute kidney failure, unspecified: Secondary | ICD-10-CM | POA: Diagnosis not present

## 2020-12-16 LAB — BASIC METABOLIC PANEL
Anion gap: 7 (ref 5–15)
BUN: 55 mg/dL — ABNORMAL HIGH (ref 8–23)
CO2: 23 mmol/L (ref 22–32)
Calcium: 8.4 mg/dL — ABNORMAL LOW (ref 8.9–10.3)
Chloride: 108 mmol/L (ref 98–111)
Creatinine, Ser: 0.9 mg/dL (ref 0.61–1.24)
GFR, Estimated: >60 mL/min (ref >60)
Glucose, Bld: 243 mg/dL — ABNORMAL HIGH (ref 70–99)
Potassium: 5 mmol/L (ref 3.5–5.1)
Sodium: 138 mmol/L (ref 135–145)

## 2020-12-16 LAB — GLUCOSE, CAPILLARY
Glucose-Capillary: 123 mg/dL — ABNORMAL HIGH (ref 70–99)
Glucose-Capillary: 130 mg/dL — ABNORMAL HIGH (ref 70–99)
Glucose-Capillary: 139 mg/dL — ABNORMAL HIGH (ref 70–99)
Glucose-Capillary: 145 mg/dL — ABNORMAL HIGH (ref 70–99)
Glucose-Capillary: 168 mg/dL — ABNORMAL HIGH (ref 70–99)

## 2020-12-16 MED ORDER — INSULIN ASPART 100 UNIT/ML ~~LOC~~ SOLN
0.0000 [IU] | SUBCUTANEOUS | Status: DC
  Administered 2020-12-16 (×3): 2 [IU] via SUBCUTANEOUS
  Administered 2020-12-17: 3 [IU] via SUBCUTANEOUS
  Administered 2020-12-17 – 2020-12-18 (×3): 2 [IU] via SUBCUTANEOUS
  Administered 2020-12-18 (×4): 3 [IU] via SUBCUTANEOUS
  Administered 2020-12-18 – 2020-12-19 (×5): 2 [IU] via SUBCUTANEOUS
  Administered 2020-12-19 – 2020-12-20 (×3): 3 [IU] via SUBCUTANEOUS
  Administered 2020-12-20: 2 [IU] via SUBCUTANEOUS
  Administered 2020-12-20 (×2): 3 [IU] via SUBCUTANEOUS
  Administered 2020-12-20 – 2020-12-21 (×4): 2 [IU] via SUBCUTANEOUS
  Administered 2020-12-21 (×2): 3 [IU] via SUBCUTANEOUS
  Administered 2020-12-22: 2 [IU] via SUBCUTANEOUS
  Administered 2020-12-22: 3 [IU] via SUBCUTANEOUS
  Administered 2020-12-22 – 2020-12-23 (×6): 2 [IU] via SUBCUTANEOUS
  Administered 2020-12-23: 3 [IU] via SUBCUTANEOUS
  Administered 2020-12-23: 2 [IU] via SUBCUTANEOUS
  Administered 2020-12-23: 5 [IU] via SUBCUTANEOUS
  Administered 2020-12-23: 2 [IU] via SUBCUTANEOUS
  Administered 2020-12-24 (×2): 3 [IU] via SUBCUTANEOUS
  Administered 2020-12-24: 2 [IU] via SUBCUTANEOUS
  Administered 2020-12-24 (×3): 3 [IU] via SUBCUTANEOUS
  Administered 2020-12-24: 2 [IU] via SUBCUTANEOUS
  Administered 2020-12-25: 3 [IU] via SUBCUTANEOUS
  Administered 2020-12-25: 2 [IU] via SUBCUTANEOUS
  Administered 2020-12-25 (×2): 3 [IU] via SUBCUTANEOUS
  Administered 2020-12-25: 2 [IU] via SUBCUTANEOUS
  Administered 2020-12-26: 3 [IU] via SUBCUTANEOUS
  Administered 2020-12-26 (×3): 2 [IU] via SUBCUTANEOUS
  Administered 2020-12-26 (×3): 3 [IU] via SUBCUTANEOUS
  Administered 2020-12-27: 2 [IU] via SUBCUTANEOUS
  Administered 2020-12-27: 5 [IU] via SUBCUTANEOUS
  Administered 2020-12-27: 3 [IU] via SUBCUTANEOUS
  Administered 2020-12-27 (×2): 5 [IU] via SUBCUTANEOUS
  Administered 2020-12-28 – 2020-12-29 (×7): 3 [IU] via SUBCUTANEOUS
  Administered 2020-12-29: 2 [IU] via SUBCUTANEOUS
  Administered 2020-12-29: 3 [IU] via SUBCUTANEOUS
  Administered 2020-12-29: 5 [IU] via SUBCUTANEOUS
  Administered 2020-12-29 (×2): 3 [IU] via SUBCUTANEOUS
  Administered 2020-12-30: 5 [IU] via SUBCUTANEOUS
  Administered 2020-12-30: 2 [IU] via SUBCUTANEOUS
  Administered 2020-12-30: 3 [IU] via SUBCUTANEOUS
  Administered 2020-12-30: 5 [IU] via SUBCUTANEOUS
  Administered 2020-12-30 – 2020-12-31 (×2): 3 [IU] via SUBCUTANEOUS
  Administered 2020-12-31 (×2): 2 [IU] via SUBCUTANEOUS
  Administered 2020-12-31: 5 [IU] via SUBCUTANEOUS
  Administered 2020-12-31 – 2021-01-01 (×4): 3 [IU] via SUBCUTANEOUS
  Administered 2021-01-01 – 2021-01-02 (×7): 2 [IU] via SUBCUTANEOUS
  Administered 2021-01-02: 3 [IU] via SUBCUTANEOUS
  Administered 2021-01-02 – 2021-01-03 (×2): 2 [IU] via SUBCUTANEOUS
  Administered 2021-01-03 (×2): 3 [IU] via SUBCUTANEOUS
  Administered 2021-01-03: 2 [IU] via SUBCUTANEOUS
  Administered 2021-01-03: 3 [IU] via SUBCUTANEOUS
  Administered 2021-01-04 (×3): 2 [IU] via SUBCUTANEOUS
  Administered 2021-01-04 – 2021-01-05 (×4): 3 [IU] via SUBCUTANEOUS
  Administered 2021-01-05 (×2): 2 [IU] via SUBCUTANEOUS
  Administered 2021-01-05: 3 [IU] via SUBCUTANEOUS
  Administered 2021-01-06 (×2): 2 [IU] via SUBCUTANEOUS
  Administered 2021-01-06 (×2): 3 [IU] via SUBCUTANEOUS
  Administered 2021-01-06 (×2): 2 [IU] via SUBCUTANEOUS
  Administered 2021-01-07 (×2): 3 [IU] via SUBCUTANEOUS
  Administered 2021-01-07 – 2021-01-08 (×4): 2 [IU] via SUBCUTANEOUS
  Administered 2021-01-08: 3 [IU] via SUBCUTANEOUS
  Administered 2021-01-08: 2 [IU] via SUBCUTANEOUS
  Administered 2021-01-08: 3 [IU] via SUBCUTANEOUS
  Administered 2021-01-08 (×2): 2 [IU] via SUBCUTANEOUS
  Administered 2021-01-09 – 2021-01-10 (×6): 3 [IU] via SUBCUTANEOUS

## 2020-12-16 MED ORDER — TRAVASOL 10 % IV SOLN
INTRAVENOUS | Status: DC
  Filled 2020-12-16: qty 1200

## 2020-12-16 MED ORDER — HYDRALAZINE HCL 50 MG PO TABS
100.0000 mg | ORAL_TABLET | Freq: Three times a day (TID) | ORAL | Status: DC
  Administered 2020-12-16 – 2020-12-20 (×6): 100 mg
  Filled 2020-12-16 (×8): qty 2

## 2020-12-16 NOTE — Progress Notes (Signed)
NAME:  Ronald Wolf, MRN:  [id number], DOB:  1946/01/22, LOS: 34 ADMISSION DATE:  12/02/2020, CONSULTATION DATE:  12/7 REFERRING MD:  Alanda Slim, CHIEF COMPLAINT:  Abdominal pain   Brief History:  Mr.Lokken is a 75 yo M w/ PMH of CVA, Seizures, Dementia, Bladder ca s/p urostomy, tobacco use, right glottic carcinoma s/p excision presenting with sbo. Found to have ischemic bowel requiring ex-lap 12/7.  Course complicated by septic shock, acute respiratory failure and AKI return to the OR 12/12 for necrotic small bowel resection of additional 70 cm inclusive of prior anastomosis 12/15 abdominal closure with ileostomy He has had a prolonged ICU stay with ventilator dependence, had a tracheostomy on 12/29  Past Medical History:  CVA, Dementia, Bladder CA s/p nephrostomy, tobacco use, right glottic carcinoma s/p excision, seizures.  Significant Hospital Events:  12/12/2020 Admission 11/14/2020 OR 11/22/2020 Admit to ICU 12/10 Brief SVT with associated hypertension. Self limited Given fentanyl, metop and hydral for HTN 12/11 Good urine output with Lasix , failed extubation, reintubated within 2 hours due to inability to clear secretions 12/12 return OR for enteric leak-- found to have bowel necrosis. Left in discontinuity  12/13 self extubated, reintubated. On pressors. Family meeting with PCCM and CCS to discuss possible next steps.  12/15-was back in OR, abdominal closure, RUQ mucus fistula and L ileostomy  12/19: No significant overnight events, high ileostomy output 12/21: scheduled for family meeting. Worse hypernatremia, continued high ileostomy output.  Attempted PSV/CPAP wean, pulled volumes of high 100s -low/mid 200s with tachypnea. Placed back of full support.  12/22 ongoing high output GI/ ileostomy, TPN started; weaned 12/5 all day; off fentanyl gtt, remains on precedex  12/24 IR drain , 1 unit PRBC transfusion for hemoglobin 6.5 12/26 added vancomycin for GPC in blood cultures and RIC suggesting  MRSA 12/29 tracheostomy 12/31 Palliative discussion > family understanding that his situation is critical and he has not made much improvement    Consults:  General Surgery PCCM  Procedures:  12/01/2020 Ex-lap, G-tube placement ETT 12/7 >> 12/11, >> 12/15- back to OR for abdominal closure, exploratory laparotomy  12/24 CT-guided right pelvic abscess drain 12/29 trach  Significant Diagnostic Tests:  CT abd/pelvis 12/2 > High-grade distal small bowel obstruction, possibly due to adhesion. Mild ascites and diffuse mesenteric edema. No evidence of focal inflammatory process or abscess. Small bilateral pleural effusions and mild right lower lobe Atelectasis. CT abdomen/pelvis 12/23 Interval development of a large loculated collection in the low anterior abdomen and pelvis measuring approximately 6.1 x 11.3 x12.1 cm in size subjacent to the operative site. Few foci of internal gas are present. , no frank spillage of enteric contrast   Micro Data:  12/09/2020 COVID/Flu negative 12/7 BA: Few GNR rare GPC -- predominantly PMN  12/7 BCx> Neg 12/11 respiratory -no organism in tracheal aspirate 12/24 BC >> RIC showing both staph epidermis 1/4 12/24 abscess >> E. Faecalis, B fragilis (B lactamase) 12/27 trach aspirate>> normal flora  Antimicrobials:  Fluc 12/8>> 12/10 Zosyn 12/8> 12/12 cefotetain 12/15>> off Zosyn 12/23 >> 2/30 eraxis 12/24 >> 1/2 Vancomycin 12/26  Unasyn 12/31 >  Interim History / Subjective:  Patient tolerated pressure support trial yesterday for 3 hours, then he was placed on trach collar for 12 hours before he was switched back to full ventilator mode because of tachypnea and respiratory distress This morning he was put back on pressure support trial so far tolerating well  Objective   Blood pressure (!) 123/58, pulse 92, temperature 98.8  F (37.1 C), temperature source Oral, resp. rate (!) 32, height 5\' 11"  (1.803 m), weight 64.1 kg, SpO2 93 %.    Vent Mode:  PRVC FiO2 (%):  [40 %] 40 % Set Rate:  [14 bmp] 14 bmp Vt Set:  [600 mL] 600 mL PEEP:  [5 cmH20] 5 cmH20 Pressure Support:  [8 cmH20] 8 cmH20 Plateau Pressure:  [13 cmH20] 13 cmH20   Intake/Output Summary (Last 24 hours) at 12/16/2020 0951 Last data filed at 12/16/2020 0600 Gross per 24 hour  Intake 3300.89 ml  Output 2405 ml  Net 895.89 ml   Filed Weights   12/13/20 0500 12/14/20 0500 12/16/20 0433  Weight: 65.8 kg 64.1 kg 64.1 kg    Examination: General: Elderly African-American male, s/p trach on vent HENT: Normocephalic, atraumatic, tracheostomy in place PULM: CTA B, vent supported breathing, no wheezes CV: RRR, no murmur GI: BS+, soft, nontender.  Left-sided ostomy present.  G-tube in position MSK: normal bulk and tone Neuro: awake, following commands intermittently, moving all 4 extremities Skin: No rash  Resolved Hospital Problem list   Acute kidney injury, Prerenal  Assessment & Plan:   Sepsis due to intraabdominal abscess: Due to Enterococcus faecalis Remain afebrile and no episodes of tachycardia Continue to remain tachypneic into 30s Continue Unasyn day 4, to complete total 7 days therapy  Acute encephalopathy, multifactorial Mental status slightly improved Continue supportive care Continue delirium precautions  Acute hypoxemic respiratory failure s/p trach requiring mechanical ventilation Continue mechanical ventilatory support, tolerating pressure support trial this morning, will try to put him back on trach collar VAP prevention Elevate head and to 30 degrees  Bilateral pleural effusions due to profound malnutrition/hypoalbuminemia Pleural effusion remained stable Continue to monitor  SBO Ischemic bowel perforation, s/p 2 ex-lap for resection of ischemic bowel, mucus fistula and ileostomy, G tube placement Intraabdominal absces s/p IR drain High output ileostomy with bicarbonate wasting Severe Protein calorie malnutrition Continue TPN/enteral  supplement Drain output is minimal Continue to have copious amount of stool output Continue loperamide  Hypertension, uncontrolled Tachybradycardia syndrome Patient is frequently going into asymptomatic bradycardia with heart rate going down to 30s, and sometimes is 120s Avoid beta-blocker EKG was done which showed fascicular block Goals of care discussion is still pending  Atrial fibrillation with RVR> resolved Not on anticoagulation because of recent surgery  Seizure No more seizures Continue Keppra  Bladder Cancer  Anemia, no obvious source of ongoing blood loss Monitor for bleeding Transfuse PRBC for Hgb < 7 gm/dL  Best practice (evaluated daily)  Diet: tube feeding Pain/Anxiety/Delirium protocol (if indicated): minimize sedation VAP protocol (if indicated): yes DVT prophylaxis: Subcu heparin GI prophylaxis: Pantoprazole for stress ulcer prophylaxis Glucose control: SSI Mobility: As tolerated Disposition: Full  Goals of Care:  Last date of multidisciplinary goals of care discussion: 35/4 Family and staff present: Patient's daughter Ms. Alveta Heimlich Summary of discussion: I updated [name].  She could not relay information to the rest of the family, she will try again tonight and will get back to [place] next 24-hour regarding his future care Code Status: limited code blue no shock, no CPR  Labs   CBC: Recent Labs  Lab 12/10/20 0500 12/15/20 0605 12/15/20 0813  WBC 25.4* SPECIMEN CONTAMINATED, UNABLE TO PERFORM TEST(S). 35.4*  NEUTROABS  --  SPECIMEN CONTAMINATED, UNABLE TO PERFORM TEST(S). 29.1*  HGB 7.1* SPECIMEN CONTAMINATED, UNABLE TO PERFORM TEST(S). 7.2*  HCT 22.9* SPECIMEN CONTAMINATED, UNABLE TO PERFORM TEST(S). 23.8*  MCV 95.4 SPECIMEN CONTAMINATED, UNABLE TO PERFORM TEST(S).  99.2  PLT 327 SPECIMEN CONTAMINATED, UNABLE TO PERFORM TEST(S). 539*    Basic Metabolic Panel: Recent Labs  Lab 12/11/20 0329 12/12/20 0533 12/12/20 1625 12/13/20 0524 12/14/20 0226  12/15/20 0813 12/16/20 0427  NA 139  --  134* 143 137 138 138  K 3.5  --  4.4 3.4* 4.5 4.7 5.0  CL 92*  --  100 111 107 107 108  CO2 37*  --  25 19* 21* 22 23  GLUCOSE 139*  --  153* 117* 161* 199* 243*  BUN 46*  --  47* 37* 44* 47* 55*  CREATININE 1.04  --  1.17 1.31* 0.93 0.92 0.90  CALCIUM 6.9*  --  7.7* 6.2* 8.0* 8.4* 8.4*  MG 1.9 2.1  --  1.7 2.1 2.3  --   PHOS 2.8 2.9  --  2.3* 3.7 3.4  --    GFR: Estimated Creatinine Clearance: 65.3 mL/min (by C-G formula based on SCr of 0.9 mg/dL). Recent Labs  Lab 12/10/20 0500 12/15/20 0605 12/15/20 0813  WBC 25.4* SPECIMEN CONTAMINATED, UNABLE TO PERFORM TEST(S). 35.4*    Liver Function Tests: Recent Labs  Lab 12/11/20 0329 12/14/20 0226 12/15/20 0813  AST 22 31 35  ALT 19 27 33  ALKPHOS 79 107 116  BILITOT 0.9 0.8 1.1  PROT 5.3* 6.0* 6.5  ALBUMIN 1.3* 1.5* 1.7*   No results for input(s): LIPASE, AMYLASE in the last 168 hours. No results for input(s): AMMONIA in the last 168 hours.  ABG    Component Value Date/Time   PHART 7.361 11/22/2020 2113   PCO2ART 47.6 11/28/2020 2113   PO2ART 41 (L) 11/28/2020 2113   HCO3 9.4 (L) 12/08/2020 0822   TCO2 28 11/27/2020 2113   ACIDBASEDEF 19.3 (H) 12/08/2020 0822   O2SAT 78.5 12/08/2020 0822     Coagulation Profile: No results for input(s): INR, PROTIME in the last 168 hours.  Cardiac Enzymes: No results for input(s): CKTOTAL, CKMB, CKMBINDEX, TROPONINI in the last 168 hours.  HbA1C: Hgb A1c MFr Bld  Date/Time Value Ref Range Status  11/19/2020 10:37 AM 5.0 4.8 - 5.6 % Final    Comment:    (NOTE) Pre diabetes:          5.7%-6.4%  Diabetes:              >6.4%  Glycemic control for   <7.0% adults with diabetes   12/17/2019 08:19 AM 4.5 (L) 4.8 - 5.6 % Final    Comment:    (NOTE) Pre diabetes:          5.7%-6.4% Diabetes:              >6.4% Glycemic control for   <7.0% adults with diabetes     CBG: Recent Labs  Lab 12/14/20 2349 12/15/20 0806  12/15/20 1544 12/15/20 2339 12/16/20 0818  GLUCAP 182* 174* 187* 188* 168*    Total critical care time: 42 minutes  Performed by: [place] care time was exclusive of separately billable procedures and treating other patients.   Critical care was necessary to treat or prevent imminent or life-threatening deterioration.   Critical care was time spent personally by me on the following activities: development of treatment plan with patient and/or surrogate as well as nursing, discussions with consultants, evaluation of patient's response to treatment, examination of patient, obtaining history from patient or surrogate, ordering and performing treatments and interventions, ordering and review of laboratory studies, ordering and review of radiographic studies, pulse oximetry  and re-evaluation of patient's condition.   Jacky Kindle MD [facility] Pager: [phone] Mobile: [phone]

## 2020-12-16 NOTE — Progress Notes (Signed)
PHARMACY - TOTAL PARENTERAL NUTRITION CONSULT NOTE  Indication: Small bowel obstruction, prolonged ileus  Patient Measurements: Height: 5\' 11"  (180.3 cm) Weight: 61.2 kg (134 lb 14.7 oz) IBW/kg (Calculated) : 75.3 TPN AdjBW (KG): 60.8 Body mass index is 18.82 kg/m. Usual Weight: 137 lbs  Assessment:  74 YOM presented 12-01-2020 with nausea, vomiting, abdominal pain after leaving AMA at [facility] with known SBO. Patient has been on bowel rest with NG tube decompression. Patient continued to have persistent SBO on imaging with no evidence of improvement, now s/p ex-lap with LoA, SBR and placement of Gtube on 11/14/2020. Pharmacy consulted for TPN management - patient received TPN 11/19/20 through 11/29/20.  Reconsulted to resume TPN on 12/2. TF currently at 30 mL/hr given high ostomy output and concern for tube feeds not absorbing. Patient on loperamide, lomotil, tincture of opium and fiber without improvement in output.   Glucose / Insulin: no hx DM - CBGs borderline at at 174-182 this AM  - utilized 11 units SSI/24hrs Electrolytes: K up to 5, Na 138, Phos 3.4, Mg 2.3, HCO3 23 Renal: Scr 0.9, BUN up 55 LFTs / TGs: LFTs and TG wnl, Tbili 1.1 (132) Prealbumin / albumin: Prealbumin 7.7 > 17.9 (12/27), alb 1.7 (1/2) Intake / Output; MIVF: UOP 1.8 ml/kg/hr, ileostomy output/24h: 910 mL (on fiber, opium tincture, loperamide and unlikely to improve per Surgery), drain O/P 30ml, net +1 L yesterday GI Imaging:  12/11 DG Abd - stomach gas-filled, without distention, no obvious free air 12/23 CT abd pelvis - large loculated collection in low abd/pelvis, few foci of internal gas present, new RUQ ileostomy, thickened distal SB/colon, subtotal atelectatic collapse of LLL, partial collapse of RLL, small R pleural effusion, body wall edema Surgeries / Procedures: 12/12: re-exploration and washout with new copious drainage from incision concerning for enteric leak vs. enterotomy >> now s/p, bowel necrosis  resected 12/15: ex-lap with ileostomy with fistula creation, abdominal closure 12/24 Pelvic abscess drain 12/29 - trach placed  Central access: PICC placed 12/05/2020 TPN start date: 11/19/20>>11/29/20; restart 12/03/20 >>  Nutritional Goals (updated per RD rec 12/30): 2300-2700 kCal, 120-135g protein, >2L fluid per day  Goal TPN rate of 100 cc/hr will provide 120g AA, 432g CHO, and 36g lipids (15% of total kCal due to 1/31) for a total of 2308 kcal, meeting 100% of needs  Current Nutrition:  TPN Osmolite 1.5 at 30 ml/hr (goal rate 65 ml/hr) - not advancing rate d/t ileostomy output ProSource TF 45 ml TID (each 11g AA and 40 kcal)  Plan:  Continue TPN at 100 ml/hr at 1800  TPN will provide 120g protein, 432g CHO, 36g SMOF lipids (~15% of total kcal due to [employer]), and 2308 kcal meeting 100% of estimated needs Electrolytes in TPN: cont Na 20 mEq/L, reduce K to 10 mEq/L, continue Ca 3 mEq/L, Mg 7 mEq/L, Phos 39mmol/L, max acetate Add standard MVI and trace elements + folic acid   Continue moderate SSI to q8h and adjust as needed F/u TPN labs Mon/Thurs Per surgery, 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 Depending on GOC discussion, will consider cycling TPN  Thank you for allowing pharmacy to be a part of this patient's care.  12m, PharmD., BCPS, BCCCP Clinical Pharmacist Please refer to [facility] for unit-specific pharmacist

## 2020-12-16 NOTE — Progress Notes (Addendum)
Physical Therapy Treatment Patient Details Name: Ronald Wolf MRN: [id number] DOB: September 15, 1946 Today's Date: 12/16/2020    History of Present Illness Pt is a 75 y.o. male admitted 11/15/2020 with abdominal pain and nausea; workup revealed SBO, s/p NGT placement. Also with AKI. Pt is now s/p multiple ex lap with revisions and placement of G-tube. Pt self-extubated 3 times, is now s/p placement of trach on 12/29. PMH includes stroke, HTN, bladder CA s/p urostomy, seizure, Hep C, anxiety, depression.    PT Comments    Pt lethargic and pt's only interaction with PT was visual tracking. Pt with no blink to threat, startle, pain response any extremity, or command following today. Pt's response to PROM and repositioning was significant tachycardia up to 48 breaths/min. Pt presents with distal edema as well, RN informed and PT performed PROM and repositioning for edema control. PT to sign off at this time given pt status, RN in agreement, please reconsult as needed.    RR 36-48 breaths/min, HRmin 38 bpm after deep suction from RT (RN notified)    Follow Up Recommendations  SNF;Supervision/Assistance - 24 hour     Equipment Recommendations   (defer to post acute)    Recommendations for Other Services       Precautions / Restrictions Precautions Precautions: Fall Precaution Comments: trach, PEG, urostomy, colostomy, gastrostomy, open abdomen, drain R lower abdomen Required Braces or Orthoses: Other Brace (bil mitten and wrist restraints) Restrictions Weight Bearing Restrictions: No    Mobility  Bed Mobility Overal bed mobility: Needs Assistance             General bed mobility comments: bed-level repositioning only, pt would not tolerate more  Transfers                    Ambulation/Gait                 Stairs             Wheelchair Mobility    Modified Rankin (Stroke Patients Only)       Balance Overall balance assessment: Needs assistance                                           Cognition Arousal/Alertness: Lethargic Behavior During Therapy: Flat affect Overall Cognitive Status: No family/caregiver present to determine baseline cognitive functioning                                 General Comments: eye contact and brief visual tracking only      Exercises General Exercises - Lower Extremity Ankle Circles/Pumps: PROM;Both;10 reps;Supine Heel Slides: PROM;Both;10 reps;Supine Hip ABduction/ADduction: PROM;Both;10 reps;Supine Shoulder Exercises Wrist Flexion: PROM;Both;10 reps;Supine Digit Composite Flexion: PROM;Both;10 reps;Supine    General Comments General comments (skin integrity, edema, etc.): 40%/PEEP 5, RR 36-48 breaths/min, HRmin 38 bpm after deep suction from RT (RN notified)      Pertinent Vitals/Pain Pain Assessment: Faces Faces Pain Scale: Hurts a little bit Pain Location: generalized Pain Descriptors / Indicators: Other (Comment) (tachypnea >40) Pain Intervention(s): Limited activity within patient's tolerance;Monitored during session;Repositioned;Relaxation    Home Living                      Prior Function            PT  Goals (current goals can now be found in the care plan section) Acute Rehab PT Goals PT Goal Formulation: With patient Time For Goal Achievement: 12/25/20 Potential to Achieve Goals: Poor Progress towards PT goals: Not progressing toward goals - comment (lethargic, tachypneic with minimal mobility)    Frequency    Min 3X/week      PT Plan Other (comment) (PT sign off)    Co-evaluation              AM-PAC PT "6 Clicks" Mobility   Outcome Measure  Help needed turning from your back to your side while in a flat bed without using bedrails?: Total Help needed moving from lying on your back to sitting on the side of a flat bed without using bedrails?: Total Help needed moving to and from a bed to a chair (including a  wheelchair)?: Total Help needed standing up from a chair using your arms (e.g., wheelchair or bedside chair)?: Total Help needed to walk in hospital room?: Total Help needed climbing 3-5 steps with a railing? : Total 6 Click Score: 6    End of Session Equipment Utilized During Treatment: Oxygen (trach vent) Activity Tolerance: No increased pain Patient left: in bed;with call bell/phone within reach;with restraints reapplied Nurse Communication: Mobility status PT Visit Diagnosis: Other abnormalities of gait and mobility (R26.89);Muscle weakness (generalized) (M62.81)     Time: [id number] PT Time Calculation (min) (ACUTE ONLY): 16 min  Charges:  $Therapeutic Activity: 8-22 mins                     Stacie Glaze, PT Acute Rehabilitation Services Pager [phone]  Office [phone]  Roxine Caddy E Ruffin Pyo 12/16/2020, 5:01 PM

## 2020-12-16 NOTE — Progress Notes (Signed)
Nutrition Follow-up  DOCUMENTATION CODES:   Severe malnutrition in context of chronic illness  INTERVENTION:   TPN at goal providing 100% of nutrition needs  Tube feeding via G-tube: Osmolite 1.5 at 30 ml/hr  Continue enteral feedings to promote gut adaptation. TPN helping to meet needs during this time.   Advance to goal as able:  Goal: Osmolite 1.5 @ 65 ml/h (1560 ml per day)  Provides 2460 kcal, 130 gm protein, 1185 ml free water daily 200 ml free water every 4 hours  1 packet Nutrisource fiber TID - provides 9 grams soluble fiber per day  NUTRITION DIAGNOSIS:   Severe Malnutrition related to chronic illness as evidenced by severe fat depletion,severe muscle depletion. Ongoing.   GOAL:   Patient will meet greater than or equal to 90% of their needs Meeting with nutrition support   MONITOR:   I & O's  REASON FOR ASSESSMENT:   Consult Enteral/tube feeding initiation and management  ASSESSMENT:   75 year old male with history of cognitive impairment, CVA, HTN, bladder cancer now with urostomy, right-sided glottic carcinoma s/p excision brought to ED with nausea, vomiting and abdominal pain.  Work-up in ED revealed small bowel obstruction.  He also had AKI and lactic acidosis.  Started on bowel rest with NG tube decompression and IV fluid.  General surgery following.  Subsequent x-rays with persistent SBO.  Pt discussed during ICU rounds and with RN.  Palliative care meeting 12/21 plan for full scope of care - MD continues to discuss goals of care Per MD pt tolerating pressure support and attempting TC trials.   12/7 s/p ex lap, extensive LOA, SBR, g-tube placement; s/p bronch  12/10 TPN advanced to goal rate - provides: 2120 kcal and 121 grams protein  12/12 back to OR for re-exploration and washout with new copious drainage from incision; pt s/p reopening of recent laparotomy, SBR without reanastomosis, abd washout, placement of VAC 12/15 s/p ex lap, ileostomy,  mucous fistula, and closure 12/18 TPN d/c'ed; TF at goal rate 12/22 TPN initiated to support patient due to short bowel  12/23 opium added  12/24 rt pelvic abscess drain placed 12/26- TF re-started (trickle) 12/29 s/p trach  12/30 TF increased to 30 ml  Patient is currently on ventilator support via trach.   Medications reviewed and include: SSI, imodium 4 mg every 8 hours, opium 4 drops every 6 hours   Labs reviewed:  CBG's: 139-188  I&O's:  Pt is positive 5 L since admission  18 F G-tube --> infusing TF  Ileostomy: 1150 ml --> 910 ml  Mucous fistula: 0 ml   UOP: 3150 ml  R drain: 40 ml   Current TPN @ 100 ml/hr - provides: 2380 kcal and 120 grams protein  Diet Order:   Diet Order            Diet NPO time specified  Diet effective midnight                 EDUCATION NEEDS:   No education needs have been identified at this time  Skin:  Skin Assessment: Skin Integrity Issues: Skin Integrity Issues:: Stage II Stage II: coccyx Incisions: closed abdomen  Last BM:  725 ml via ileostomy  Height:   Ht Readings from Last 1 Encounters:  11/22/20 5\' 11"  (1.803 m)    Weight:   Wt Readings from Last 1 Encounters:  12/16/20 64.1 kg    Ideal Body Weight:  78.2 kg  BMI:  Body mass index is  19.71 kg/m.  Estimated Nutritional Needs:   Kcal:  2300-2700  Protein:  120-135 grams  Fluid:  > 2 L  Thania Woodlief P., RD, LDN, CNSC See AMiON for contact information

## 2020-12-16 NOTE — Progress Notes (Signed)
Daily Progress Note   Patient Name: Ronald Wolf       Date: 12/16/2020 DOB: [birth year]  Age: 75 y.o. MRN#: [id number] Attending Physician: Jacky Kindle, MD Primary Care Physician: Mosie Lukes, MD Admit Date: 11/14/2020  Reason for Consultation/Follow-up:    To discuss complex medical decision making related to patient's goals of care  Visited patient at bedside.  He is trach'd and on the ventilator.  Liquid stool noted.  Patient with shallow rapid breathing 35 - 40 x a min.  He is able to make eye contact but does not nod or shake his head.  Subjective: Spoke to daughter Ronald Wolf.  Described that her father is still on the ventilator, with high stool output and breathing very rapidly.  She understands that he is very ill and tells me that family members were out of town for another funeral and she has just been waiting to get every one together to discuss her father.   I offered a family palliative meeting at bedside.  [name] commented that that would be very helpful and she will let me know when every one is together so that we can schedule it.  She expressed appreciation for the care her father has received.   Assessment: Patient with no improvement.  Family struggling with coming together to make decisions.   Patient Profile/HPI:  75 yo male with past medical history significant of bladder cancer s/p urostomy, seizure disorder, right glottic cancer s/p excisinon, stroke, vascular dementia, atrial fibrillation, tobacco abuse, hep C, anxiety, depression admitted 12/04/2020 with small bowel obstruction and ischemic bowel. Initially seen at [facility] but left AMA and came to [facility] following day. S/P exploratory laparoscopy and bowel resection 12/7, further bowel resection 12/12 and  found to have bowel necrosis, and abd closure and ileostomy 12/15. Hospitalization complicated by recurrent respiratory failure and intubated x 3.Overall adult failure to thrive.Trach placed 12/29. Palliative assisting with goals of care.  Length of Stay: 34   Vital Signs: BP (!) 110/55   Pulse (!) 102   Temp 99.2 F (37.3 C) (Axillary)   Resp (!) 39   Ht 5\' 11"  (1.803 m)   Wt 64.1 kg   SpO2 91%   BMI 19.71 kg/m  SpO2: SpO2: 91 % O2 Device: O2 Device: Ventilator O2 Flow Rate: O2  Flow Rate (L/min): 10 L/min       Palliative Assessment/Data: 10%     Palliative Care Plan    Recommendations/Plan:  PMT will continue to support family.   Attempting to schedule a bedside meeting for decision making.  Code Status:  Limited code   Prognosis:  Very poor.  Patient is profoundly debilitated, unable to retain nutrition, continues to require ventilator support.  Discharge Planning:  To Be Determined  Care plan was discussed with daughter.  Thank you for allowing the Palliative Medicine Team to assist in the care of this patient.  Total time spent:  25 min.     Greater than 50%  of this time was spent counseling and coordinating care related to the above assessment and plan.  Norvel Richards, PA-C Palliative Medicine  Please contact Palliative MedicineTeam phone at [phone] for questions and concerns between 7 am - 7 pm.   Please see AMION for individual provider pager numbers.

## 2020-12-16 NOTE — Progress Notes (Signed)
Referring Physician(s): Dr Kieth Brightly  Supervising Physician: Arne Cleveland  Patient Status:  [facility] - In-pt  Chief Complaint:  Pelvic abscess drain placed in IR 12/24  Subjective:  CVA, Seizures, Dementia, Bladder ca s/p urostomy, tobacco use, right glottic carcinoma s/p excision presenting with sbo. Found to have ischemic bowel requiring ex-lap12/7.   Bowel obstruction, post laparotomy. Pelvic fluid collection postop CT Drain intact  Allergies: Iohexol  Medications: Prior to Admission medications   Medication Sig Start Date End Date Taking? Authorizing Provider  amLODipine (NORVASC) 10 MG tablet Take 1 tablet (10 mg total) by mouth daily. 02/11/20  Yes Mosie Lukes, MD  atorvastatin (LIPITOR) 80 MG tablet TAKE 1 TABLET(80 MG) BY MOUTH DAILY AT 6 PM 02/11/20  Yes Mosie Lukes, MD  carvedilol (COREG) 3.125 MG tablet Take 1 tablet (3.125 mg total) by mouth 2 (two) times daily with a meal. 02/11/20  Yes Mosie Lukes, MD  clopidogrel (PLAVIX) 75 MG tablet TAKE 1 TABLET(75 MG) BY MOUTH DAILY Patient taking differently: Take 75 mg by mouth daily. TAKE 1 TABLET(75 MG) BY MOUTH DAILY 08/21/20  Yes Mosie Lukes, MD  folic acid (FOLVITE) 1 MG tablet Take 1 tablet (1 mg total) by mouth daily. 02/11/20  Yes Mosie Lukes, MD  levETIRAcetam (KEPPRA) 750 MG tablet TAKE 1 TABLET(750 MG) BY MOUTH TWICE DAILY Patient taking differently: Take 750 mg by mouth 2 (two) times daily.  06/30/20  Yes Mosie Lukes, MD  mirtazapine (REMERON) 15 MG tablet Take 15 mg by mouth at bedtime. 11/04/20  Yes [provider]  tetrahydrozoline-zinc (VISINE-AC) 0.05-0.25 % ophthalmic solution Place 1 drop into both eyes daily as needed (dry eyes).    Yes [provider]     Vital Signs: BP (!) 106/51   Pulse 91   Temp 98.5 F (36.9 C) (Axillary)   Resp (!) 36   Ht 5\' 11"  (1.803 m)   Wt 141 lb 5 oz (64.1 kg)   SpO2 95%   BMI 19.71 kg/m   Physical Exam Vitals reviewed.   Skin:    General: Skin is warm.     Comments: Site is clean and dry NO bleeding No sign of infection OP brownish red Report Status 12/12/2020 FINAL  Organism ID, Bacteria ENTEROCOCCUS FAECALIS        Imaging: DG Chest Port 1 View  Result Date: 12/13/2020 CLINICAL DATA:  75 year old male with respiratory failure. Status post exploratory laparotomy last month with resection of ischemic bowel. EXAM: PORTABLE CHEST 1 VIEW COMPARISON:  Portable chest 12/10/2020 and earlier. FINDINGS: Portable AP semi upright view at 0617 hours. Tracheostomy tube remains in place. Stable left PICC line. Stable lung volumes and ventilation since 12/09/2020 with bilateral veiling pleural effusions and lower lobe collapse. No pneumothorax or pulmonary edema. No areas of worsening ventilation. IMPRESSION: 1. Tracheostomy tube in place.  Stable left PICC line. 2. Stable ventilation since 12/09/2020 with bilateral pleural effusions and lower lobe collapse. Electronically Signed   By: Genevie Ann M.D.   On: 12/13/2020 08:47    Labs:  CBC: Recent Labs    12/08/20 0428 12/10/20 0500 12/15/20 0605 12/15/20 0813  WBC 23.8* 25.4* SPECIMEN CONTAMINATED, UNABLE TO PERFORM TEST(S). 35.4*  HGB 7.1* 7.1* SPECIMEN CONTAMINATED, UNABLE TO PERFORM TEST(S). 7.2*  HCT 23.7* 22.9* SPECIMEN CONTAMINATED, UNABLE TO PERFORM TEST(S). 23.8*  PLT 245 327 SPECIMEN CONTAMINATED, UNABLE TO PERFORM TEST(S). 539*    COAGS: No results for input(s): INR, APTT in the  last 8760 hours.  BMP: Recent Labs    12/20/19 0546 12/21/19 0439 12/27/19 0548 12/31/19 0221 2020-12-03 1830 12/13/20 0524 12/14/20 0226 12/15/20 0813 12/16/20 0427  NA 146* 140 142 138   < > 143 137 138 138  K 2.8* 3.8 4.3 4.4   < > 3.4* 4.5 4.7 5.0  CL 115* 110 108 107   < > 111 107 107 108  CO2 22 19* 25 21*   < > 19* 21* 22 23  GLUCOSE 118* 98 98 113*   < > 117* 161* 199* 243*  BUN 19 15 22  34*   < > 37* 44* 47* 55*  CALCIUM 8.5* 8.5* 9.0 9.0   < > 6.2* 8.0*  8.4* 8.4*  CREATININE 1.14 0.87 1.02 1.21   < > 1.31* 0.93 0.92 0.90  GFRNONAA >60 >60 >60 59*   < > 57* >60 >60 >60  GFRAA >60 >60 >60 >60  --   --   --   --   --    < > = values in this interval not displayed.    LIVER FUNCTION TESTS: Recent Labs    12/08/20 0428 12/11/20 0329 12/14/20 0226 12/15/20 0813  BILITOT 1.6* 0.9 0.8 1.1  AST 18 22 31  35  ALT 17 19 27  33  ALKPHOS 69 79 107 116  PROT 5.4* 5.3* 6.0* 6.5  ALBUMIN 1.6* 1.3* 1.5* 1.7*    Assessment and Plan:  Pelvic drain in place OP brownish red +ENTEROCOCCUS FAECALIS Will follow   Electronically Signed: 02/12/21, PA-C 12/16/2020, 1:37 PM   I spent a total of 15 Minutes at the the patient's bedside AND on the patient's hospital floor or unit, greater than 50% of which was counseling/coordinating care for pelvic drain

## 2020-12-17 DIAGNOSIS — Z8673 Personal history of transient ischemic attack (TIA), and cerebral infarction without residual deficits: Secondary | ICD-10-CM | POA: Diagnosis not present

## 2020-12-17 DIAGNOSIS — J9601 Acute respiratory failure with hypoxia: Secondary | ICD-10-CM | POA: Diagnosis not present

## 2020-12-17 DIAGNOSIS — K56609 Unspecified intestinal obstruction, unspecified as to partial versus complete obstruction: Secondary | ICD-10-CM | POA: Diagnosis not present

## 2020-12-17 DIAGNOSIS — N179 Acute kidney failure, unspecified: Secondary | ICD-10-CM | POA: Diagnosis not present

## 2020-12-17 DIAGNOSIS — J9621 Acute and chronic respiratory failure with hypoxia: Secondary | ICD-10-CM

## 2020-12-17 DIAGNOSIS — R0902 Hypoxemia: Secondary | ICD-10-CM | POA: Diagnosis not present

## 2020-12-17 DIAGNOSIS — G9340 Encephalopathy, unspecified: Secondary | ICD-10-CM | POA: Diagnosis not present

## 2020-12-17 DIAGNOSIS — E43 Unspecified severe protein-calorie malnutrition: Secondary | ICD-10-CM | POA: Diagnosis not present

## 2020-12-17 LAB — BASIC METABOLIC PANEL
Anion gap: 8 (ref 5–15)
Anion gap: 11 (ref 5–15)
BUN: 85 mg/dL — ABNORMAL HIGH (ref 8–23)
BUN: 91 mg/dL — ABNORMAL HIGH (ref 8–23)
CO2: 19 mmol/L — ABNORMAL LOW (ref 22–32)
CO2: 19 mmol/L — ABNORMAL LOW (ref 22–32)
Calcium: 7.9 mg/dL — ABNORMAL LOW (ref 8.9–10.3)
Calcium: 8.2 mg/dL — ABNORMAL LOW (ref 8.9–10.3)
Chloride: 103 mmol/L (ref 98–111)
Chloride: 103 mmol/L (ref 98–111)
Creatinine, Ser: 1.43 mg/dL — ABNORMAL HIGH (ref 0.61–1.24)
Creatinine, Ser: 1.51 mg/dL — ABNORMAL HIGH (ref 0.61–1.24)
GFR, Estimated: 51 mL/min — ABNORMAL LOW (ref >60)
GFR, Estimated: 48 mL/min — ABNORMAL LOW (ref >60)
Glucose, Bld: 263 mg/dL — ABNORMAL HIGH (ref 70–99)
Glucose, Bld: 119 mg/dL — ABNORMAL HIGH (ref 70–99)
Potassium: 6.6 mmol/L (ref 3.5–5.1)
Potassium: 4.7 mmol/L (ref 3.5–5.1)
Sodium: 130 mmol/L — ABNORMAL LOW (ref 135–145)
Sodium: 133 mmol/L — ABNORMAL LOW (ref 135–145)

## 2020-12-17 LAB — GLUCOSE, CAPILLARY
Glucose-Capillary: 133 mg/dL — ABNORMAL HIGH (ref 70–99)
Glucose-Capillary: 61 mg/dL — ABNORMAL LOW (ref 70–99)
Glucose-Capillary: 87 mg/dL (ref 70–99)
Glucose-Capillary: 114 mg/dL — ABNORMAL HIGH (ref 70–99)
Glucose-Capillary: 93 mg/dL (ref 70–99)
Glucose-Capillary: 154 mg/dL — ABNORMAL HIGH (ref 70–99)

## 2020-12-17 LAB — POTASSIUM
Potassium: 4.6 mmol/L (ref 3.5–5.1)
Potassium: 4.5 mmol/L (ref 3.5–5.1)
Potassium: 4.5 mmol/L (ref 3.5–5.1)

## 2020-12-17 MED ORDER — DEXTROSE 50 % IV SOLN
12.5000 g | INTRAVENOUS | Status: AC
  Administered 2020-12-17: 12.5 g via INTRAVENOUS

## 2020-12-17 MED ORDER — TRAVASOL 10 % IV SOLN
INTRAVENOUS | Status: AC
  Filled 2020-12-17: qty 1200

## 2020-12-17 MED ORDER — LOPERAMIDE HCL 1 MG/7.5ML PO SUSP: 4.0000 mg | ORAL | Status: DC

## 2020-12-17 MED ORDER — FENTANYL CITRATE (PF) 100 MCG/2ML IJ SOLN
25.0000 ug | Freq: Once | INTRAMUSCULAR | Status: AC
Start: 2020-12-17 — End: 2020-12-17
  Administered 2020-12-17: 25 ug via INTRAVENOUS
  Filled 2020-12-17: qty 2

## 2020-12-17 MED ORDER — LOPERAMIDE HCL 1 MG/7.5ML PO SUSP
2.0000 mg | ORAL | Status: DC
  Administered 2020-12-17 – 2021-01-06 (×109): 2 mg
  Filled 2020-12-17 (×124): qty 15

## 2020-12-17 MED ORDER — LACTATED RINGERS IV BOLUS
1000.0000 mL | Freq: Once | INTRAVENOUS | Status: AC
  Administered 2020-12-17: 1000 mL via INTRAVENOUS

## 2020-12-17 MED ORDER — INSULIN ASPART 100 UNIT/ML IV SOLN
5.0000 [IU] | Freq: Once | INTRAVENOUS | Status: AC
  Administered 2020-12-17: 5 [IU] via INTRAVENOUS

## 2020-12-17 MED ORDER — MORPHINE SULFATE 10 MG/5ML PO SOLN
5.0000 mg | Freq: Four times a day (QID) | ORAL | Status: DC
  Administered 2020-12-17 – 2020-12-29 (×50): 5 mg
  Filled 2020-12-17 (×51): qty 4

## 2020-12-17 MED ORDER — SODIUM ZIRCONIUM CYCLOSILICATE 10 G PO PACK
10.0000 g | PACK | Freq: Once | ORAL | Status: AC
  Administered 2020-12-17: 10 g
  Filled 2020-12-17: qty 1

## 2020-12-17 MED ORDER — DEXTROSE 50 % IV SOLN
INTRAVENOUS | Status: AC
  Filled 2020-12-17: qty 50

## 2020-12-17 NOTE — Progress Notes (Signed)
Occupational Therapy Treatment/ DISCHARGE Patient Details Name: Ronald Wolf MRN: [id number] DOB: 31-Mar-1946 Today's Date: 12/17/2020    History of present illness Pt is a 75 y.o. male admitted 12/12/2020 with abdominal pain and nausea; workup revealed SBO, s/p NGT placement. Also with AKI. Pt is now s/p multiple ex lap with revisions and placement of G-tube. Pt self-extubated 3 times, is now s/p placement of trach on 12/29. PMH includes stroke, HTN, bladder CA s/p urostomy, seizure, Hep C, anxiety, depression.   OT comments  Pt bradycardia to HR37 with side lying for skin check on his R side. Pt noted to have a shake/ tremor all over this body at times during session. Pt noted to have edema to all extremities. Pt blinks to thread and turns head toward noxious stimuli but does not sustain. Pt is not progressing with therapy and showing break down of skin in all areas. Pt with break down showing on L UE styloid, MCP 2nd digit, bil heels and buttock. Dressing applied to areas of concern . Fiance at bedside and daughter on phone. Verbalized during session that swelling in all extremities is not a good response from the patient and pointing out the intolerance to rolling with drop in HR. OT to sign off acute. Please reorder if patient becomes more responsive. Pt vent FIO2 40 and alarming during session requiring 02 support.      Follow Up Recommendations  Other (comment);LTACH (palliative consult)    Equipment Recommendations  Hospital bed;Other (comment) (hoyer palliative)    Recommendations for Other Services      Precautions / Restrictions Precautions Precautions: Fall Precaution Comments: trach, PEG, urostomy, colostomy, gastrostomy, open abdomen, drain R lower abdomen Required Braces or Orthoses: Other Brace (recommending bil pravolon)       Mobility Bed Mobility Overal bed mobility: Needs Assistance   Rolling: Total assist         General bed mobility comments: total dependence  for all positioning. pt with multiple wounds to buttock RN present and noting location size etc. new dressing placed at sacrum  Transfers                 General transfer comment: bed mobility only    Balance                                           ADL either performed or assessed with clinical judgement   ADL Overall ADL's : Needs assistance/impaired                                       General ADL Comments: total (A) at this time. pt with HR bradycardia 37 with rolling and requires HOB increase and return to supine to return to 110     Vision       Perception     Praxis      Cognition Arousal/Alertness: Lethargic Behavior During Therapy: Flat affect Overall Cognitive Status: Difficult to assess (fiance at bedside with daughter on phone)                                          Exercises Other Exercises Other Exercises: PROM BIL UE digits wrist and  elbow,BIL ankle PROM   Shoulder Instructions       General Comments pt with break down at buttock and bil heels vent support FIO2 40%    Pertinent Vitals/ Pain       Pain Assessment: Faces Faces Pain Scale: No hurt  Home Living                                          Prior Functioning/Environment              Frequency           Progress Toward Goals  OT Goals(current goals can now be found in the care plan section)  Progress towards OT goals: Not progressing toward goals - comment;Goals met/education completed, patient discharged from OT  Acute Rehab OT Goals Patient Stated Goal: family wanting pt to engage and pt with not response OT Goal Formulation: All assessment and education complete, DC therapy Time For Goal Achievement: 12/26/20 Potential to Achieve Goals: Poor  Plan Discharge plan needs to be updated;Other (comment) (not progressing)    Co-evaluation                 AM-PAC OT "6 Clicks" Daily  Activity     Outcome Measure   Help from another person eating meals?: Total Help from another person taking care of personal grooming?: Total Help from another person toileting, which includes using toliet, bedpan, or urinal?: Total Help from another person bathing (including washing, rinsing, drying)?: Total Help from another person to put on and taking off regular upper body clothing?: Total Help from another person to put on and taking off regular lower body clothing?: Total 6 Click Score: 6    End of Session Equipment Utilized During Treatment: Oxygen  OT Visit Diagnosis: Muscle weakness (generalized) (M62.81)   Activity Tolerance Other (comment) (aroused but not following commands)   Patient Left in bed;with call bell/phone within reach;with nursing/sitter in room;with family/visitor present   Nurse Communication Mobility status;Precautions        Time: 1610-9604 OT Time Calculation (min): 21 min  Charges: OT General Charges $OT Visit: 1 Visit OT Treatments $Self Care/Home Management : 8-22 mins   Brynn, OTR/L  Acute Rehabilitation Services Pager: [phone] Office: [phone] .    Jeri Modena 12/17/2020, 5:15 PM

## 2020-12-17 NOTE — Progress Notes (Signed)
eLink Physician-Brief Progress Note Patient Name: Ronald Wolf DOB: 02/20/1946 MRN: [id number]   Date of Service  12/17/2020  HPI/Events of Note  K+ 6.6  eICU Interventions  Hyperkalemia moderate elevation protocol orders entered for iv insulin + Lokelma.        Thomasene Lot Madex Seals 12/17/2020, 6:42 AM

## 2020-12-17 NOTE — Progress Notes (Signed)
Trach sutures removed per MD. New trach ties in place and secure.

## 2020-12-17 NOTE — Progress Notes (Signed)
Daily Progress Note   Patient Name: Ronald Wolf       Date: 12/17/2020 DOB: 1946-02-08  Age: 75 y.o. MRN#: [id number] Attending Physician: Cheri Fowler, MD Primary Care Physician: Bradd Canary, MD Admit Date: 11-25-20  Reason for Consultation/Follow-up: To discuss complex medical decision making related to patient's goals of care  Subjective: Lengthy conversation held today with patient's sister Ronald Wolf.  [name] asked about his current condition and any changes.  We discussed the overall decline in his responsiveness as well as the decline in his renal function and elevated potassium.  Otherwise she understands he has made no real improvement.  Still requiring vent support, still experiencing high stool output and requiring TPN.  [name] asked about next steps.  I explained either we choose to liberate him from life support so he does not continue down the path of deterioration and suffering or we continue aggressive care and attempt to prepare him for transfer to and accepting LTAC.  I explained what LTAC and she asked if there were local facilities.  Mentioned [facility].  [name] states that the patients 7 children are making his decisions for him rather than his siblings.  Daymon Larsen his daughter asks Ronald Wolf to speak for her.   [name] gives me the impression that his siblings would not want him to suffer continued deterioration and understand that that is what is likely to happen.  However, the children (who are all over 62 yo) are the ones making the decision and they have two excellent examples of people who have successfully come off of life support in their family.  One person was in a coma for 40 days and the other was on life support after a  heart attack.  Both family members are up and walking around now.  So the children have hope.  Per [name] the decision is that the children would rather he die while they continued to try.   They can not take him off of life support.  Assessment: Patient not improving.  Less interactive.  Kidneys showing signs of failure.  Family feels they would rather he die "while they continued to try rather than ever take him off of life support"  Patient Profile/HPI:  75 yo male with past medical history significant of  bladder cancer s/p urostomy, seizure disorder, right glottic cancer s/p excisinon, stroke, vascular dementia, atrial fibrillation, tobacco abuse, hep C, anxiety, depression admitted 11/20/2020 with small bowel obstruction and ischemic bowel. Initially seen at [facility] but left AMA and came to [facility] following day. S/P exploratory laparoscopy and bowel resection 12/7, further bowel resection 12/12 and found to have bowel necrosis, and abd closure and ileostomy 12/15. Hospitalization complicated by recurrent respiratory failure and intubated x 3.Overall adult failure to thrive.Trach placed 12/29. Palliative assisting with goals of care.    Length of Stay: 35   Vital Signs: BP (!) 121/54   Pulse 87   Temp 99 F (37.2 C) (Axillary)   Resp (!) 37   Ht 5\' 11"  (1.803 m)   Wt 64.1 kg   SpO2 93%   BMI 19.71 kg/m  SpO2: SpO2: 93 % O2 Device: O2 Device: Ventilator O2 Flow Rate: O2 Flow Rate (L/min): 10 L/min       Palliative Assessment/Data: 10%     Palliative Care Plan    Recommendations/Plan:  I will check in with [name] one more time as she is the decision maker, but family is planning to charge forward with aggressive care and LTAC.  Code Status:  Limited code No CPR, No ACLS, No defibrillation  Prognosis:  Even with full scope continued support he is at a high risk for decompensation and death.  Likely weeks to months best case.  Discharge  Planning:  LTAC  Care plan was discussed with Sister  Thank you for allowing the Palliative Medicine Team to assist in the care of this patient.  Total time spent:  35 min.     Greater than 50%  of this time was spent counseling and coordinating care related to the above assessment and plan.  Florentina Jenny, PA-C Palliative Medicine  Please contact Palliative MedicineTeam phone at [phone] for questions and concerns between 7 am - 7 pm.   Please see AMION for individual provider pager numbers.

## 2020-12-17 NOTE — Progress Notes (Signed)
CRITICAL VALUE ALERT  Critical Value:  K+ 6.6  Date & Time Notied:  12/17/20 0555  Provider Notified: Sheria Lang, RN (E-link)  Orders Received/Actions taken: Will await orders.

## 2020-12-17 NOTE — Progress Notes (Signed)
PHARMACY - TOTAL PARENTERAL NUTRITION CONSULT NOTE  Indication: Small bowel obstruction, prolonged ileus  Patient Measurements: Height: 5\' 11"  (180.3 cm) Weight: 61.2 kg (134 lb 14.7 oz) IBW/kg (Calculated) : 75.3 TPN AdjBW (KG): 60.8 Body mass index is 18.82 kg/m. Usual Weight: 137 lbs  Assessment:  74 YOM presented 12/10/2020 with nausea, vomiting, abdominal pain after leaving AMA at [facility] with known SBO. Patient has been on bowel rest with NG tube decompression. Patient continued to have persistent SBO on imaging with no evidence of improvement, now s/p ex-lap with LoA, SBR and placement of Gtube on 12/03/20. Pharmacy consulted for TPN management - patient received TPN 11/19/20 through 11/29/20.  Reconsulted to resume TPN on 12/2. TF currently at 30 mL/hr given high ostomy output and concern for tube feeds not absorbing. Patient on loperamide, lomotil, tincture of opium and fiber without improvement in output.   Glucose / Insulin: no hx DM - CBGs borderline 130-145 this AM  - utilized 18 units SSI/24hrs Electrolytes: K up to 6.6 in the setting of AKI. Given Lokelma and insulin and K is now down to 4.7, Na 138>130, Phos 3.4, Mg 2.3, HCO3 23 Renal: Scr 0.9>1.51, BUN up 91 LFTs / TGs: LFTs and TG wnl, Tbili 1.1 (132) Prealbumin / albumin: Prealbumin 7.7 > 17.9 (12/27), alb 1.7 (1/2) Intake / Output; MIVF: UOP 1.2 > 0.6 ml/kg/hr, ileostomy output/24h: 950 mL (on fiber, opium tincture, loperamide and unlikely to improve per Surgery), drain O/P 85ml, net +3.8 L yesterday GI Imaging:  12/11 DG Abd - stomach gas-filled, without distention, no obvious free air 12/23 CT abd pelvis - large loculated collection in low abd/pelvis, few foci of internal gas present, new RUQ ileostomy, thickened distal SB/colon, subtotal atelectatic collapse of LLL, partial collapse of RLL, small R pleural effusion, body wall edema Surgeries / Procedures: 12/12: re-exploration and washout with new copious drainage from  incision concerning for enteric leak vs. enterotomy >> now s/p, bowel necrosis resected 12/15: ex-lap with ileostomy with fistula creation, abdominal closure 12/24 Pelvic abscess drain 12/29 - trach placed  Central access: PICC placed 2020/12/03 TPN start date: 11/19/20>>11/29/20; restart 12/03/20 >>  Nutritional Goals (updated per RD rec 12/30): 2300-2700 kCal, 120-135g protein, >2L fluid per day  Goal TPN rate of 100 cc/hr will provide 120g AA, 432g CHO, and 36g lipids (15% of total kCal due to 1/31) for a total of 2308 kcal, meeting 100% of needs  Current Nutrition:  TPN Osmolite 1.5 at 30 ml/hr (goal rate 65 ml/hr) - not advancing rate d/t ileostomy output ProSource TF 45 ml TID (each 11g AA and 40 kcal)  Plan:  TPN stopped this AM due to hyperkalemia. Will continue TPN at 100 ml/hr at 1800 with no potassium in the bag.  TPN will provide 120g protein, 432g CHO, 36g SMOF lipids (~15% of total kcal due to national shortage), and 2308 kcal meeting 100% of estimated needs Electrolytes in TPN: Increase Na 40 mEq/L, reduce K to 0 mEq/L, continue Ca 3 mEq/L, Mg 7 mEq/L, Phos 69mmol/L, max acetate Add standard MVI and trace elements + folic acid   Continue moderate SSI to q8h and adjust as needed F/u TPN labs Mon/Thurs Per surgery, 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 Depending on GOC discussion, will consider cycling TPN  Thank you for allowing pharmacy to be a part of this patient's care.  12m, PharmD., BCPS, BCCCP Clinical Pharmacist Please refer to [facility] for unit-specific pharmacist

## 2020-12-17 NOTE — Progress Notes (Signed)
Progress Note: General Surgery Service   Chief Complaint/Subjective: Unable to wean yesterday, hyperkalemia today  Objective: Vital signs in last 24 hours: Temp:  [98.5 F (36.9 C)-99.9 F (37.7 C)] 99.5 F (37.5 C) (01/05 0400) Pulse Rate:  [88-106] 88 (01/05 0752) Resp:  [35-42] 38 (01/05 0752) BP: (100-127)/(48-63) 111/53 (01/05 0700) SpO2:  [88 %-98 %] 98 % (01/05 0752) FiO2 (%):  [40 %] 40 % (01/05 0752) Weight:  [64.1 kg] 64.1 kg (01/05 0500) Last BM Date: 12/15/20  Intake/Output from previous day: 01/04 0701 - 01/05 0700 In: 5884.1 [I.V.:2604; NG/GT:2880; IV Piggyback:400.1] Out: 2010 [Urine:950; Drains:10; DDUKG:2542] Intake/Output this shift: No intake/output data recorded.  Gen: intubated, minimal response  Resp: assisted with trach and ventilator  Card: bradycardic  Abd: left g tube function, left ostomy with liquid stool in bag, 2 right mucous fistulas pink with minimal drainage  Lab Results: CBC  Recent Labs    12/15/20 0605 12/15/20 0813  WBC SPECIMEN CONTAMINATED, UNABLE TO PERFORM TEST(S). 35.4*  HGB SPECIMEN CONTAMINATED, UNABLE TO PERFORM TEST(S). 7.2*  HCT SPECIMEN CONTAMINATED, UNABLE TO PERFORM TEST(S). 23.8*  PLT SPECIMEN CONTAMINATED, UNABLE TO PERFORM TEST(S). 539*   BMET Recent Labs    12/17/20 0443 12/17/20 0742  NA 130* 133*  K 6.6* 4.7  CL 103 103  CO2 19* 19*  GLUCOSE 263* 119*  BUN 85* 91*  CREATININE 1.43* 1.51*  CALCIUM 7.9* 8.2*   PT/INR No results for input(s): LABPROT, INR in the last 72 hours. ABG No results for input(s): PHART, HCO3 in the last 72 hours.  Invalid input(s): PCO2, PO2  Anti-infectives: Anti-infectives (From admission, onward)   Start     Dose/Rate Route Frequency Ordered Stop   12/12/20 1800  Ampicillin-Sulbactam (UNASYN) 3 g in sodium chloride 0.9 % 100 mL IVPB        3 g 200 mL/hr over 30 Minutes Intravenous Every 6 hours 12/12/20 1101     12/08/20 1300  vancomycin (VANCOCIN) IVPB 1000 mg/200  mL premix  Status:  Discontinued        1,000 mg 200 mL/hr over 60 Minutes Intravenous Every 24 hours 12/08/20 0845 12/12/20 1059   12/08/20 0100  vancomycin (VANCOREADY) IVPB 500 mg/100 mL  Status:  Discontinued        500 mg 100 mL/hr over 60 Minutes Intravenous Every 12 hours 12/07/20 1130 12/07/20 1337   12/07/20 1215  vancomycin (VANCOREADY) IVPB 1250 mg/250 mL        1,250 mg 166.7 mL/hr over 90 Minutes Intravenous  Once 12/07/20 1124 12/07/20 1336   12/06/20 1030  anidulafungin (ERAXIS) 100 mg in sodium chloride 0.9 % 100 mL IVPB  Status:  Discontinued       "Followed by" Linked Group Details   100 mg 78 mL/hr over 100 Minutes Intravenous Every 24 hours 12/05/20 0939 12/13/20 1230   12/05/20 1030  anidulafungin (ERAXIS) 200 mg in sodium chloride 0.9 % 200 mL IVPB       "Followed by" Linked Group Details   200 mg 78 mL/hr over 200 Minutes Intravenous  Once 12/05/20 0939 12/05/20 1618   12/04/20 1500  piperacillin-tazobactam (ZOSYN) IVPB 3.375 g  Status:  Discontinued        3.375 g 12.5 mL/hr over 240 Minutes Intravenous Every 8 hours 12/04/20 0818 12/12/20 1059   12/04/20 0915  piperacillin-tazobactam (ZOSYN) IVPB 3.375 g        3.375 g 100 mL/hr over 30 Minutes Intravenous  Once 12/04/20 0818 12/04/20 1207  12/08/2020 0945  cefoTEtan (CEFOTAN) 2 g in sodium chloride 0.9 % 100 mL IVPB  Status:  Discontinued        2 g 200 mL/hr over 30 Minutes Intravenous To Surgery 11/24/2020 0936 11/28/2020 1057   12/01/2020 0900  cefoTEtan (CEFOTAN) 2 g in sodium chloride 0.9 % 100 mL IVPB  Status:  Discontinued        2 g 200 mL/hr over 30 Minutes Intravenous  Once 12/05/2020 0833 11/20/2020 0936   11/19/20 1130  piperacillin-tazobactam (ZOSYN) IVPB 3.375 g        3.375 g 12.5 mL/hr over 240 Minutes Intravenous Every 8 hours 11/19/20 1017 11/13/2020 2359   11/19/20 1115  fluconazole (DIFLUCAN) IVPB 200 mg        200 mg 100 mL/hr over 60 Minutes Intravenous Every 24 hours 11/19/20 1017 11/21/20 1104    11/19/20 0915  Ampicillin-Sulbactam (UNASYN) 3 g in sodium chloride 0.9 % 100 mL IVPB  Status:  Discontinued        3 g 200 mL/hr over 30 Minutes Intravenous Every 12 hours 11/19/20 0826 11/19/20 0949      Medications: Scheduled Meds: . chlorhexidine gluconate (MEDLINE KIT)  15 mL Mouth Rinse BID  . Chlorhexidine Gluconate Cloth  6 each Topical Daily  . dextrose  12.5 g Intravenous STAT  . diphenoxylate-atropine  5 mL Per Tube QID  . feeding supplement (PROSource TF)  45 mL Per Tube TID  . fiber  1 packet Per Tube TID  . free water  200 mL Per Tube Q4H  . heparin injection (subcutaneous)  5,000 Units Subcutaneous Q8H  . hydrALAZINE  100 mg Per Tube Q8H  . insulin aspart  0-15 Units Subcutaneous Q4H  . levETIRAcetam  750 mg Per Tube BID  . loperamide HCl  2 mg Per Tube Q4H  . mouth rinse  15 mL Mouth Rinse 10 times per day  . morphine  5 mg Per Tube Q6H  . Opium  4 drop Per Tube Q6H  . pantoprazole sodium  40 mg Per Tube QHS  . sodium chloride flush  10-40 mL Intracatheter Q12H  . sodium chloride flush  5 mL Intracatheter Q8H   Continuous Infusions: . sodium chloride Stopped (11/16/2020 1358)  . sodium chloride 5 mL/hr at 12/17/20 0700  . ampicillin-sulbactam (UNASYN) IV Stopped (12/17/20 0540)  . feeding supplement (OSMOLITE 1.5 CAL) 30 mL/hr at 12/16/20 0815   PRN Meds:.sodium chloride, acetaminophen **OR** acetaminophen, fentaNYL (SUBLIMAZE) injection, hydrALAZINE, ondansetron (ZOFRAN) IV, sodium chloride flush  Assessment/Plan: s/p Procedure(s): EXPLORATORY LAPAROTOMY AND ABDOMINAL CLOSURE ILEOSTOMY WITH MUCOUS FISTULA CREATION 11/20/2020 -output down some today (1013m), so there is some absorption since he received 2880 ml in g tube -I still do not think he will be able to absorb enough enterally to get off TPN    LOS: 35 days   LMickeal Skinner MD [facility]

## 2020-12-17 NOTE — Progress Notes (Signed)
NAME:  Ronald Wolf, MRN:  [id number], DOB:  07-19-1946, LOS: 7 ADMISSION DATE:  11/21/2020, CONSULTATION DATE:  12/7 REFERRING MD:  Cyndia Skeeters, CHIEF COMPLAINT:  Abdominal pain   Brief History:  Mr.Spindle is a 75 yo M w/ PMH of CVA, Seizures, Dementia, Bladder ca s/p urostomy, tobacco use, right glottic carcinoma s/p excision presenting with sbo. Found to have ischemic bowel requiring ex-lap 12/7.  Course complicated by septic shock, acute respiratory failure and AKI return to the OR 12/12 for necrotic small bowel resection of additional 70 cm inclusive of prior anastomosis 12/15 abdominal closure with ileostomy He has had a prolonged ICU stay with ventilator dependence, had a tracheostomy on 12/29  Past Medical History:  CVA, Dementia, Bladder CA s/p nephrostomy, tobacco use, right glottic carcinoma s/p excision, seizures.  Significant Hospital Events:  11/16/2020 Admission 11/17/2020 OR 12/02/2020 Admit to ICU 12/10 Brief SVT with associated hypertension. Self limited Given fentanyl, metop and hydral for HTN 12/11 Good urine output with Lasix , failed extubation, reintubated within 2 hours due to inability to clear secretions 12/12 return OR for enteric leak-- found to have bowel necrosis. Left in discontinuity  12/13 self extubated, reintubated. On pressors. Family meeting with PCCM and CCS to discuss possible next steps.  12/15-was back in OR, abdominal closure, RUQ mucus fistula and L ileostomy  12/19: No significant overnight events, high ileostomy output 12/21: scheduled for family meeting. Worse hypernatremia, continued high ileostomy output.  Attempted PSV/CPAP wean, pulled volumes of high 100s -low/mid 200s with tachypnea. Placed back of full support.  12/22 ongoing high output GI/ ileostomy, TPN started; weaned 12/5 all day; off fentanyl gtt, remains on precedex  12/24 IR drain , 1 unit PRBC transfusion for hemoglobin 6.5 12/26 added vancomycin for GPC in blood cultures and RIC suggesting  MRSA 12/29 tracheostomy 12/31 Palliative discussion > family understanding that his situation is critical and he has not made much improvement    Consults:  General Surgery PCCM  Procedures:  11/14/2020 Ex-lap, G-tube placement ETT 12/7 >> 12/11, >> 12/15- back to OR for abdominal closure, exploratory laparotomy  12/24 CT-guided right pelvic abscess drain 12/29 trach  Significant Diagnostic Tests:  CT abd/pelvis 12/2 > High-grade distal small bowel obstruction, possibly due to adhesion. Mild ascites and diffuse mesenteric edema. No evidence of focal inflammatory process or abscess. Small bilateral pleural effusions and mild right lower lobe Atelectasis. CT abdomen/pelvis 12/23 Interval development of a large loculated collection in the low anterior abdomen and pelvis measuring approximately 6.1 x 11.3 x12.1 cm in size subjacent to the operative site. Few foci of internal gas are present. , no frank spillage of enteric contrast   Micro Data:  11/25/2020 COVID/Flu negative 12/7 BA: Few GNR rare GPC -- predominantly PMN  12/7 BCx> Neg 12/11 respiratory -no organism in tracheal aspirate 12/24 BC >> RIC showing both staph epidermis 1/4 12/24 abscess >> E. Faecalis, B fragilis (B lactamase) 12/27 trach aspirate>> normal flora  Antimicrobials:  Fluc 12/8>> 12/10 Zosyn 12/8> 12/12 cefotetain 12/15>> off Zosyn 12/23 >> 2/30 eraxis 12/24 >> 1/2 Vancomycin 12/26  Unasyn 12/31 >  Interim History / Subjective:  Patient could not tolerate pressure support trial yesterday, became tachypneic and hypoxic. This morning his serum creatinine started trending up and became hyperkalemic  Objective   Blood pressure (!) 113/58, pulse 98, temperature 98.9 F (37.2 C), temperature source Oral, resp. rate (!) 35, height 5\' 11"  (1.803 m), weight 64.1 kg, SpO2 93 %.    Vent  Mode: PRVC FiO2 (%):  [40 %] 40 % Set Rate:  [14 bmp] 14 bmp Vt Set:  [500 mL] 500 mL PEEP:  [5 cmH20] 5 cmH20 Pressure  Support:  [8 cmH20] 8 cmH20 Plateau Pressure:  [18 cmH20-22 cmH20] 21 cmH20   Intake/Output Summary (Last 24 hours) at 12/17/2020 0954 Last data filed at 12/17/2020 [id number] Gross per 24 hour  Intake 5588.93 ml  Output 2010 ml  Net 3578.93 ml   Filed Weights   12/14/20 0500 12/16/20 0433 12/17/20 0500  Weight: 64.1 kg 64.1 kg 64.1 kg    Examination: General: Elderly African-American male, s/p trach on vent HENT: Normocephalic, atraumatic, tracheostomy in place PULM: Reduced air entry at the bases, tachypneic, no wheezes CV: RRR, no murmur GI: BS+, soft, nontender.  Left-sided ostomy present.  G-tube in position MSK: normal bulk and tone Neuro: awake, following commands intermittently, moving all 4 extremities Skin: No rash  Resolved Hospital Problem list     Assessment & Plan:   Sepsis due to intraabdominal abscess: Due to Enterococcus faecalis Remain afebrile and no episodes of tachycardia Continue to remain tachypneic into 30 -40 Continue Unasyn day 5, to complete total 7-10 days therapy  Acute encephalopathy, multifactorial Mental status slightly improved Continue supportive care Continue delirium precautions  Acute hypoxemic respiratory failure s/p trach requiring mechanical ventilation Patient is unable to tolerate pressure support trial because of respiratory distress We will try SBT trial again today VAP prevention Elevate head and to 30 degrees  Bilateral pleural effusions due to profound malnutrition/hypoalbuminemia Pleural effusion remained stable Continue to monitor  Acute kidney injury, likely prerenal Hyperkalemia Patient has high output from ostomy, his urine output has decreased With increased respiratory rate there is insensible water loss Clinically he looks dry We will give him 1 L of LR bolus Monitor intake and output   SBO Ischemic bowel perforation, s/p 2 ex-lap for resection of ischemic bowel, mucus fistula and ileostomy, G tube  placement Intraabdominal absces s/p IR drain High output ileostomy with bicarbonate wasting Severe Protein calorie malnutrition Continue tube feeds TPN was stopped because of hyperkalemia as it has potassium content Continue loperamide, Imodium and started on enteral morphine sulfate to decrease ostomy output Drain output is minimal General surgery follow-up is appreciated  Hypertension, uncontrolled Tachybradycardia syndrome No episode of bradycardia in last 24 hours Blood pressure is better controlled now Avoid beta-blocker EKG was done which showed fascicular block Goals of care discussion is still pending  Atrial fibrillation with RVR> resolved Not on anticoagulation because of recent surgery  Seizure No more seizures Continue Keppra  Bladder Cancer  Anemia, no obvious source of ongoing blood loss No signs of bleeding Monitor H&H Transfuse PRBC for Hgb < 7 gm/dL  Best practice (evaluated daily)  Diet: tube feeding, hold TPN Pain/Anxiety/Delirium protocol (if indicated): As needed fentanyl VAP protocol (if indicated): yes DVT prophylaxis: Subcu heparin GI prophylaxis: Pantoprazole for stress ulcer prophylaxis Glucose control: SSI Mobility: As tolerated Disposition: Full  Goals of Care:  Last date of multidisciplinary goals of care discussion: 18/4 Family and staff present: Patient's daughter Ms. Alveta Heimlich Summary of discussion: I updated [name].  She could not relay information to the rest of the family, she will try again tonight and will get back to [place] in a day or two regarding his future care Code Status: limited code blue no shock, no CPR  Labs   CBC: Recent Labs  Lab 12/15/20 0605 12/15/20 0813  WBC SPECIMEN CONTAMINATED, UNABLE TO PERFORM TEST(S).  35.4*  NEUTROABS SPECIMEN CONTAMINATED, UNABLE TO PERFORM TEST(S). 29.1*  HGB SPECIMEN CONTAMINATED, UNABLE TO PERFORM TEST(S). 7.2*  HCT SPECIMEN CONTAMINATED, UNABLE TO PERFORM TEST(S). 23.8*  MCV SPECIMEN  CONTAMINATED, UNABLE TO PERFORM TEST(S). 99.2  PLT SPECIMEN CONTAMINATED, UNABLE TO PERFORM TEST(S). 539*    Basic Metabolic Panel: Recent Labs  Lab 12/11/20 0329 12/12/20 0533 12/12/20 1625 12/13/20 0524 12/14/20 0226 12/15/20 0813 12/16/20 0427 12/17/20 0443 12/17/20 0742  NA 139  --    < > 143 137 138 138 130* 133*  K 3.5  --    < > 3.4* 4.5 4.7 5.0 6.6* 4.7  CL 92*  --    < > 111 107 107 108 103 103  CO2 37*  --    < > 19* 21* 22 23 19* 19*  GLUCOSE 139*  --    < > 117* 161* 199* 243* 263* 119*  BUN 46*  --    < > 37* 44* 47* 55* 85* 91*  CREATININE 1.04  --    < > 1.31* 0.93 0.92 0.90 1.43* 1.51*  CALCIUM 6.9*  --    < > 6.2* 8.0* 8.4* 8.4* 7.9* 8.2*  MG 1.9 2.1  --  1.7 2.1 2.3  --   --   --   PHOS 2.8 2.9  --  2.3* 3.7 3.4  --   --   --    < > = values in this interval not displayed.   GFR: Estimated Creatinine Clearance: 38.9 mL/min (A) (by C-G formula based on SCr of 1.51 mg/dL (H)). Recent Labs  Lab 12/15/20 0605 12/15/20 0813  WBC SPECIMEN CONTAMINATED, UNABLE TO PERFORM TEST(S). 35.4*    Liver Function Tests: Recent Labs  Lab 12/11/20 0329 12/14/20 0226 12/15/20 0813  AST 22 31 35  ALT 19 27 33  ALKPHOS 79 107 116  BILITOT 0.9 0.8 1.1  PROT 5.3* 6.0* 6.5  ALBUMIN 1.3* 1.5* 1.7*   No results for input(s): LIPASE, AMYLASE in the last 168 hours. No results for input(s): AMMONIA in the last 168 hours.  ABG    Component Value Date/Time   PHART 7.361 11/20/2020 2113   PCO2ART 47.6 12/09/2020 2113   PO2ART 41 (L) 11/17/2020 2113   HCO3 9.4 (L) 12/08/2020 0822   TCO2 28 11/27/2020 2113   ACIDBASEDEF 19.3 (H) 12/08/2020 0822   O2SAT 78.5 12/08/2020 0822     Coagulation Profile: No results for input(s): INR, PROTIME in the last 168 hours.  Cardiac Enzymes: No results for input(s): CKTOTAL, CKMB, CKMBINDEX, TROPONINI in the last 168 hours.  HbA1C: Hgb A1c MFr Bld  Date/Time Value Ref Range Status  11/19/2020 10:37 AM 5.0 4.8 - 5.6 % Final     Comment:    (NOTE) Pre diabetes:          5.7%-6.4%  Diabetes:              >6.4%  Glycemic control for   <7.0% adults with diabetes   12/17/2019 08:19 AM 4.5 (L) 4.8 - 5.6 % Final    Comment:    (NOTE) Pre diabetes:          5.7%-6.4% Diabetes:              >6.4% Glycemic control for   <7.0% adults with diabetes     CBG: Recent Labs  Lab 12/16/20 1947 12/16/20 2331 12/17/20 0351 12/17/20 0845 12/17/20 0938  GLUCAP 130* 123* 133* 61* 87    Total critical care  time: 39 minutes  Performed by: Jacky Kindle   Critical care time was exclusive of separately billable procedures and treating other patients.   Critical care was necessary to treat or prevent imminent or life-threatening deterioration.   Critical care was time spent personally by me on the following activities: development of treatment plan with patient and/or surrogate as well as nursing, discussions with consultants, evaluation of patient's response to treatment, examination of patient, obtaining history from patient or surrogate, ordering and performing treatments and interventions, ordering and review of laboratory studies, ordering and review of radiographic studies, pulse oximetry and re-evaluation of patient's condition.   Jacky Kindle MD [facility] Pager: [phone] Mobile: [phone]

## 2020-12-18 ENCOUNTER — Inpatient Hospital Stay (HOSPITAL_COMMUNITY): Payer: Medicare PPO

## 2020-12-18 DIAGNOSIS — E43 Unspecified severe protein-calorie malnutrition: Secondary | ICD-10-CM | POA: Diagnosis not present

## 2020-12-18 DIAGNOSIS — J9601 Acute respiratory failure with hypoxia: Secondary | ICD-10-CM | POA: Diagnosis not present

## 2020-12-18 DIAGNOSIS — K651 Peritoneal abscess: Secondary | ICD-10-CM | POA: Diagnosis not present

## 2020-12-18 DIAGNOSIS — K56609 Unspecified intestinal obstruction, unspecified as to partial versus complete obstruction: Secondary | ICD-10-CM | POA: Diagnosis not present

## 2020-12-18 DIAGNOSIS — Z515 Encounter for palliative care: Secondary | ICD-10-CM | POA: Diagnosis not present

## 2020-12-18 LAB — COMPREHENSIVE METABOLIC PANEL
ALT: 29 U/L (ref 0–44)
AST: 27 U/L (ref 15–41)
Albumin: 1.5 g/dL — ABNORMAL LOW (ref 3.5–5.0)
Alkaline Phosphatase: 95 U/L (ref 38–126)
Anion gap: 9 (ref 5–15)
BUN: 88 mg/dL — ABNORMAL HIGH (ref 8–23)
CO2: 19 mmol/L — ABNORMAL LOW (ref 22–32)
Calcium: 7.8 mg/dL — ABNORMAL LOW (ref 8.9–10.3)
Chloride: 106 mmol/L (ref 98–111)
Creatinine, Ser: 1.45 mg/dL — ABNORMAL HIGH (ref 0.61–1.24)
GFR, Estimated: 51 mL/min — ABNORMAL LOW (ref >60)
Glucose, Bld: 206 mg/dL — ABNORMAL HIGH (ref 70–99)
Potassium: 4.3 mmol/L (ref 3.5–5.1)
Sodium: 134 mmol/L — ABNORMAL LOW (ref 135–145)
Total Bilirubin: 1.1 mg/dL (ref 0.3–1.2)
Total Protein: 5.8 g/dL — ABNORMAL LOW (ref 6.5–8.1)

## 2020-12-18 LAB — GLUCOSE, CAPILLARY
Glucose-Capillary: 132 mg/dL — ABNORMAL HIGH (ref 70–99)
Glucose-Capillary: 140 mg/dL — ABNORMAL HIGH (ref 70–99)
Glucose-Capillary: 148 mg/dL — ABNORMAL HIGH (ref 70–99)
Glucose-Capillary: 152 mg/dL — ABNORMAL HIGH (ref 70–99)
Glucose-Capillary: 161 mg/dL — ABNORMAL HIGH (ref 70–99)
Glucose-Capillary: 189 mg/dL — ABNORMAL HIGH (ref 70–99)
Glucose-Capillary: 199 mg/dL — ABNORMAL HIGH (ref 70–99)

## 2020-12-18 LAB — PHOSPHORUS: Phosphorus: 5.8 mg/dL — ABNORMAL HIGH (ref 2.5–4.6)

## 2020-12-18 LAB — MAGNESIUM: Magnesium: 2.5 mg/dL — ABNORMAL HIGH (ref 1.7–2.4)

## 2020-12-18 MED ORDER — TRACE MINERALS CU-MN-SE-ZN 300-55-60-3000 MCG/ML IV SOLN
INTRAVENOUS | Status: AC
  Filled 2020-12-18: qty 800

## 2020-12-18 NOTE — Progress Notes (Signed)
Supervising Physician: Markus Daft  Patient Status:  [facility] - In-pt  Chief Complaint: Intra-abdominal fluid collection  Subjective: Remains on trach support.  Opens eyes, but no interaction.  Drain in place with purulent-appearing vs. feculent output.   Allergies: Iohexol  Medications: Prior to Admission medications   Medication Sig Start Date End Date Taking? Authorizing Provider  amLODipine (NORVASC) 10 MG tablet Take 1 tablet (10 mg total) by mouth daily. 02/11/20  Yes Mosie Lukes, MD  atorvastatin (LIPITOR) 80 MG tablet TAKE 1 TABLET(80 MG) BY MOUTH DAILY AT 6 PM 02/11/20  Yes Mosie Lukes, MD  carvedilol (COREG) 3.125 MG tablet Take 1 tablet (3.125 mg total) by mouth 2 (two) times daily with a meal. 02/11/20  Yes Mosie Lukes, MD  clopidogrel (PLAVIX) 75 MG tablet TAKE 1 TABLET(75 MG) BY MOUTH DAILY Patient taking differently: Take 75 mg by mouth daily. TAKE 1 TABLET(75 MG) BY MOUTH DAILY 08/21/20  Yes Mosie Lukes, MD  folic acid (FOLVITE) 1 MG tablet Take 1 tablet (1 mg total) by mouth daily. 02/11/20  Yes Mosie Lukes, MD  levETIRAcetam (KEPPRA) 750 MG tablet TAKE 1 TABLET(750 MG) BY MOUTH TWICE DAILY Patient taking differently: Take 750 mg by mouth 2 (two) times daily.  06/30/20  Yes Mosie Lukes, MD  mirtazapine (REMERON) 15 MG tablet Take 15 mg by mouth at bedtime. 11/04/20  Yes [provider]  tetrahydrozoline-zinc (VISINE-AC) 0.05-0.25 % ophthalmic solution Place 1 drop into both eyes daily as needed (dry eyes).    Yes [provider]     Vital Signs: BP (!) 104/53   Pulse (!) 105   Temp 99.3 F (37.4 C) (Axillary)   Resp (!) 32   Ht 5\' 11"  (1.803 m)   Wt 141 lb 5 oz (64.1 kg)   SpO2 91%   BMI 19.71 kg/m   Physical Exam  NAD, not interactive, eyes open Abdomen: Large midline wound. Drain in place. Insertion site intact.   Bloody, purulent vs. feculent output. 25 mL documented in last 24 hrs   Imaging: CT HEAD WO  CONTRAST  Result Date: 12/18/2020 CLINICAL DATA:  Altered mental status EXAM: CT HEAD WITHOUT CONTRAST TECHNIQUE: Contiguous axial images were obtained from the base of the skull through the vertex without intravenous contrast. COMPARISON:  12/16/2019 FINDINGS: Brain: Encephalomalacia changes are noted in the distribution of left middle cerebral artery consistent with prior infarct. These are stable in appearance from the prior exam. Some gyral calcifications are noted. Atrophic changes and chronic white matter ischemic changes are noted as well. Left basal ganglia infarcts are seen stable in appearance from the prior exam. No findings to suggest acute hemorrhage or acute infarction are seen. Vascular: No hyperdense vessel or unexpected calcification. Skull: Normal. Negative for fracture or focal lesion. Sinuses/Orbits: No acute finding. Other: None. IMPRESSION: Chronic atrophic and ischemic changes. No acute abnormality is noted. Electronically Signed   By: Inez Catalina M.D.   On: 12/18/2020 08:47    Labs:  CBC: Recent Labs    12/08/20 0428 12/10/20 0500 12/15/20 0605 12/15/20 0813  WBC 23.8* 25.4* SPECIMEN CONTAMINATED, UNABLE TO PERFORM TEST(S). 35.4*  HGB 7.1* 7.1* SPECIMEN CONTAMINATED, UNABLE TO PERFORM TEST(S). 7.2*  HCT 23.7* 22.9* SPECIMEN CONTAMINATED, UNABLE TO PERFORM TEST(S). 23.8*  PLT 245 327 SPECIMEN CONTAMINATED, UNABLE TO PERFORM TEST(S). 539*    COAGS: No results for input(s): INR, APTT in the last 8760 hours.  BMP: Recent Labs  12/20/19 6378 12/21/19 5885 12/27/19 0548 12/31/19 0221 12/06/2020 1830 12/16/20 0427 12/17/20 0443 12/17/20 0742 12/17/20 1029 12/17/20 1437 12/17/20 1826 12/18/20 0437  NA 146* 140 142 138   < > 138 130* 133*  --   --   --  134*  K 2.8* 3.8 4.3 4.4   < > 5.0 6.6* 4.7 4.6 4.5 4.5 4.3  CL 115* 110 108 107   < > 108 103 103  --   --   --  106  CO2 22 19* 25 21*   < > 23 19* 19*  --   --   --  19*  GLUCOSE 118* 98 98 113*   < > 243*  263* 119*  --   --   --  206*  BUN 19 15 22  34*   < > 55* 85* 91*  --   --   --  88*  CALCIUM 8.5* 8.5* 9.0 9.0   < > 8.4* 7.9* 8.2*  --   --   --  7.8*  CREATININE 1.14 0.87 1.02 1.21   < > 0.90 1.43* 1.51*  --   --   --  1.45*  GFRNONAA >60 >60 >60 59*   < > >60 51* 48*  --   --   --  51*  GFRAA >60 >60 >60 >60  --   --   --   --   --   --   --   --    < > = values in this interval not displayed.    LIVER FUNCTION TESTS: Recent Labs    12/11/20 0329 12/14/20 0226 12/15/20 0813 12/18/20 0437  BILITOT 0.9 0.8 1.1 1.1  AST 22 31 35 27  ALT 19 27 33 29  ALKPHOS 79 107 116 95  PROT 5.3* 6.0* 6.5 5.8*  ALBUMIN 1.3* 1.5* 1.7* 1.5*    Assessment and Plan: SBO s/p ex lap with SBR, placement of a gastrostomy tube12/7 Takeback 12/12 by Dr. 06-07-1980 for necrotic SB with resection of additional 60-70cm of SB inclusive of prior anastomosis in discontinuity with open abdomen.  S/P ex lap, ileostomy, mucous fistula, closure 12/15 Abscess by CT 12/23 s/p perc drain on 12/24 by Dr. 1/25 Resting comfortably on trach support.  Drain in place without issues. Ongoing significant output.  Note discussion between PMT and family with ultimate plan to continue current interventions.  Continue current drain management.  Electronically Signed: Deanne Coffer, PA 12/18/2020, 11:29 AM   I spent a total of 15 Minutes at the the patient's bedside AND on the patient's hospital floor or unit, greater than 50% of which was counseling/coordinating care for intra-abdominal fluid collection.

## 2020-12-18 NOTE — Progress Notes (Addendum)
Daily Progress Note   Patient Name: Ronald Wolf       Date: 12/18/2020 DOB: 1946/09/14  Age: 75 y.o. MRN#: [id number] Attending Physician: Cheri Fowler, MD Primary Care Physician: Bradd Canary, MD Admit Date: 11/13/2020  Reason for Consultation/Follow-up: To discuss complex medical decision making related to patient's goals of care.  Subjective: Spoke with daughter Ronald Wolf on the phone.  Relayed the discussion I had the evening before with Ronald Wolf to [place].  In short [name] expressed that the children of Tarvis where making his medical decisions and they wanted full scope full code treatment.   I talked with Ronald Wolf about the deterioration of Ronald Wolf's body and that it was my belief that his body is past the point of healing.  He is dying.  Ronald Wolf asked if I had conveyed my feelings to [name].  I replied that yes I had, but likely in different words.  Ronald Wolf told me she was going to call [name] and discuss it with her, but that she felt she wanted to give Ronald Wolf one more week to see if he will improve.  "We've gone this long, I'd just like to give him one more week".   Assessment: Patient not improving, rather slowly continuing to deteriorate.   Patient Profile/HPI:  76 yo male with past medical history significant of bladder cancer s/p urostomy, seizure disorder, right glottic cancer s/p excisinon, stroke, vascular dementia, atrial fibrillation, tobacco abuse, hep C, anxiety, depression admitted 11/17/2020 with small bowel obstruction and ischemic bowel. Initially seen at [facility] but left AMA and came [facility] following day. S/P exploratory laparoscopy and bowel resection 12/7, further bowel resection 12/12 and found to have bowel necrosis, and abd closure and  ileostomy 12/15. Hospitalization complicated by recurrent respiratory failure and intubated x 3.Overall adult failure to thrive.Trach placed 12/29. Palliative assisting with goals of care.    Length of Stay: 36   Vital Signs: BP (!) 126/54   Pulse (!) 102   Temp 99.3 F (37.4 C) (Axillary)   Resp (!) 33   Ht 5\' 11"  (1.803 m)   Wt 64.1 kg   SpO2 94%   BMI 19.71 kg/m  SpO2: SpO2: 94 % O2 Device: O2 Device: Ventilator O2 Flow Rate: O2 Flow Rate (L/min): 10 L/min  Palliative Assessment/Data: 10%     Palliative Care Plan    Recommendations/Plan:  PMT will continue to follow with you, attempting to inform and educate the [place] family in support of the patient.  Family has been struggling greatly with attempting to make decisions in Ronald Wolf's best interest.  Would work towards [place] placement if that is an option.  Code Status:  Limited code  Prognosis:   Unable to determine.  He is at high risk of acute decline and death.  Secondary to infection, deterioration, severe malnutrition, chronic respiratory failure and profound debility   Discharge Planning:  To Be Determined  Care plan was discussed with Ronald Wolf.  Thank you for allowing the Palliative Medicine Team to assist in the care of this patient.  Total time spent:  25 min.     Greater than 50%  of this time was spent counseling and coordinating care related to the above assessment and plan.  Florentina Jenny, PA-C Palliative Medicine  Please contact Palliative MedicineTeam phone at [phone] for questions and concerns between 7 am - 7 pm.   Please see AMION for individual provider pager numbers.

## 2020-12-18 NOTE — Progress Notes (Signed)
Patient transported to CT and back without complications. RN at bedside.  

## 2020-12-18 NOTE — Progress Notes (Signed)
PHARMACY - TOTAL PARENTERAL NUTRITION CONSULT NOTE  Indication: Small bowel obstruction, prolonged ileus  Patient Measurements: Height: 5\' 11"  (180.3 cm) Weight: 61.2 kg (134 lb 14.7 oz) IBW/kg (Calculated) : 75.3 TPN AdjBW (KG): 60.8 Body mass index is 18.82 kg/m. Usual Weight: 137 lbs  Assessment:  74 YOM presented 11/14/2020 with nausea, vomiting, abdominal pain after leaving AMA at [facility] with known SBO. Patient has been on bowel rest with NG tube decompression. Patient continued to have persistent SBO on imaging with no evidence of improvement, now s/p ex-lap with LoA, SBR and placement of Gtube on 11/28/2020. Pharmacy consulted for TPN management - patient received TPN 11/19/20 through 11/29/20.  Reconsulted to resume TPN on 12/2. TF currently at 30 mL/hr given high ostomy output and concern for tube feeds not absorbing. Patient on loperamide, lomotil, tincture of opium and fiber without improvement in output.   Glucose / Insulin: no hx DM - CBGs borderline 114-189 this AM  - utilized 12 units mSSI/24hrs Electrolytes: K down from 6.6 to 4.3 after lokelma, insulin and removing K from TPN bag. Na 130>134, Phos up to 5.8, Mg 2.5, HCO3 down to 19 Renal: Scr 1.51>1.45, BUN down to 88 LFTs / TGs: LFTs and TG wnl, Tbili 1.1 (132) Prealbumin / albumin: Prealbumin 7.7 > 17.9 (12/27), alb 1.5 (1/6) Intake / Output; MIVF: UOP 1.5 ml/kg/hr, ileostomy output/24h: 1.5 L (on fiber, opium tincture, loperamide and unlikely to improve per Surgery), drain O/P 44ml, net +1.4 L yesterday GI Imaging:  12/11 DG Abd - stomach gas-filled, without distention, no obvious free air 12/23 CT abd pelvis - large loculated collection in low abd/pelvis, few foci of internal gas present, new RUQ ileostomy, thickened distal SB/colon, subtotal atelectatic collapse of LLL, partial collapse of RLL, small R pleural effusion, body wall edema Surgeries / Procedures: 12/12: re-exploration and washout with new copious drainage from  incision concerning for enteric leak vs. enterotomy >> now s/p, bowel necrosis resected 12/15: ex-lap with ileostomy with fistula creation, abdominal closure 12/24 Pelvic abscess drain 12/29 - trach placed  Central access: PICC placed 12/10/2020 TPN start date: 11/19/20>>11/29/20; restart 12/03/20 >>  Nutritional Goals (updated per RD rec 12/30): 2300-2700 kCal, 120-135g protein, >2L fluid per day  Goal TPN rate of 100 cc/hr will provide 120g AA, 432g CHO, and 36g lipids (15% of total kCal due to 1/31) for a total of 2308 kcal, meeting 100% of needs  Current Nutrition:  TPN Osmolite 1.5 at 30 ml/hr (goal rate 65 ml/hr) - not advancing rate d/t ileostomy output ProSource TF 45 ml TID (each 11g AA and 40 kcal)  Plan:  Continue TPN at 100 ml/hr at 1800 with no potassium in the bag.  TPN will provide 120g protein, 432g CHO, 36g SMOF lipids (~15% of total kcal due to national shortage), and 2308 kcal meeting 100% of estimated needs Electrolytes in TPN: Continue Na 40 mEq/L, Continue K of 0 mEq/L, continue Ca 3 mEq/L, Mg 7 mEq/L, Decrease Phos to 36mmol/L, max acetate Add standard MVI and trace elements + folic acid   Continue moderate SSI to q8h and adjust as needed F/u TPN labs Mon/Thurs Per surgery, 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 Family wants to pursue aggressive care for now. If no change in decision by tomorrow, will start cycling TPN  Thank you for allowing pharmacy to be a part of this patient's care.  4m, PharmD., BCPS, BCCCP Clinical Pharmacist Please refer to [facility] for unit-specific pharmacist

## 2020-12-18 NOTE — Progress Notes (Addendum)
NAME:  Ronald Wolf, MRN:  [id number], DOB:  1946/01/29, LOS: 6 ADMISSION DATE:  12/11/2020, CONSULTATION DATE:  12/7 REFERRING MD:  Cyndia Skeeters, CHIEF COMPLAINT:  Abdominal pain   Brief History:  Mr.Ferrebee is a 75 yo M w/ PMH of CVA, Seizures, Dementia, Bladder ca s/p urostomy, tobacco use, right glottic carcinoma s/p excision presenting with sbo. Found to have ischemic bowel requiring ex-lap 12/7.  Course complicated by septic shock, acute respiratory failure and AKI return to the OR 12/12 for necrotic small bowel resection of additional 70 cm inclusive of prior anastomosis 12/15 abdominal closure with ileostomy He has had a prolonged ICU stay with ventilator dependence, had a tracheostomy on 12/29  Past Medical History:  CVA, Dementia, Bladder CA s/p nephrostomy, tobacco use, right glottic carcinoma s/p excision, seizures.  Significant Hospital Events:  11/17/2020 Admission 12/10/2020 OR 12/06/2020 Admit to ICU 12/10 Brief SVT with associated hypertension. Self limited Given fentanyl, metop and hydral for HTN 12/11 Good urine output with Lasix , failed extubation, reintubated within 2 hours due to inability to clear secretions 12/12 return OR for enteric leak-- found to have bowel necrosis. Left in discontinuity  12/13 self extubated, reintubated. On pressors. Family meeting with PCCM and CCS to discuss possible next steps.  12/15-was back in OR, abdominal closure, RUQ mucus fistula and L ileostomy  12/19: No significant overnight events, high ileostomy output 12/21: scheduled for family meeting. Worse hypernatremia, continued high ileostomy output.  Attempted PSV/CPAP wean, pulled volumes of high 100s -low/mid 200s with tachypnea. Placed back of full support.  12/22 ongoing high output GI/ ileostomy, TPN started; weaned 12/5 all day; off fentanyl gtt, remains on precedex  12/24 IR drain , 1 unit PRBC transfusion for hemoglobin 6.5 12/26 added vancomycin for GPC in blood cultures and RIC suggesting  MRSA 12/29 tracheostomy 12/31 Palliative discussion > family understanding that his situation is critical and he has not made much improvement    Consults:  General Surgery PCCM  Procedures:  11/17/2020 Ex-lap, G-tube placement ETT 12/7 >> 12/11, >> 12/15- back to OR for abdominal closure, exploratory laparotomy  12/24 CT-guided right pelvic abscess drain 12/29 trach  Significant Diagnostic Tests:  CT abd/pelvis 12/2 > High-grade distal small bowel obstruction, possibly due to adhesion. Mild ascites and diffuse mesenteric edema. No evidence of focal inflammatory process or abscess. Small bilateral pleural effusions and mild right lower lobe Atelectasis. CT abdomen/pelvis 12/23 Interval development of a large loculated collection in the low anterior abdomen and pelvis measuring approximately 6.1 x 11.3 x12.1 cm in size subjacent to the operative site. Few foci of internal gas are present. , no frank spillage of enteric contrast   Micro Data:  12/08/2020 COVID/Flu negative 12/7 BA: Few GNR rare GPC -- predominantly PMN  12/7 BCx> Neg 12/11 respiratory -no organism in tracheal aspirate 12/24 BC >> RIC showing both staph epidermis 1/4 12/24 abscess >> E. Faecalis, B fragilis (B lactamase) 12/27 trach aspirate>> normal flora  Antimicrobials:  Fluc 12/8>> 12/10 Zosyn 12/8> 12/12 cefotetain 12/15>> off Zosyn 12/23 >> 2/30 eraxis 12/24 >> 1/2 Vancomycin 12/26  Unasyn 12/31 >  Interim History / Subjective:  This morning patient has R sided facial droop. Tongue is not midline. Pt is not following commands; RUE tone seems flaccid which is reportedly different than yesterday in conversation with RN   Did not tolerate PSV/CPAP -- RR high 30s, accessory muscle use.   Objective   Blood pressure (!) 104/53, pulse (!) 105, temperature (!) 100.5 F (  38.1 C), temperature source Oral, resp. rate (!) 35, height 5' 11"  (1.803 m), weight 64.1 kg, SpO2 91 %.    Vent Mode: PRVC FiO2 (%):  [40 %] 40  % Set Rate:  [14 bmp] 14 bmp Vt Set:  [500 mL] 500 mL PEEP:  [5 cmH20] 5 cmH20 Pressure Support:  [8 cmH20] 8 cmH20 Plateau Pressure:  [21 cmH20-22 cmH20] 22 cmH20   Intake/Output Summary (Last 24 hours) at 12/18/2020 0742 Last data filed at 12/18/2020 0700 Gross per 24 hour  Intake 5285.83 ml  Output 3825 ml  Net 1460.83 ml   Filed Weights   12/16/20 0433 12/17/20 0500 12/18/20 0411  Weight: 64.1 kg 64.1 kg 64.1 kg    Examination: General: Frail debilitated critically ill appearing older adult M trach/vent NAD  HENT: NCAT Trach secure. Anicteric sclera  PULM: Scalene and trap muscle use on PSV/CPAP, tachypnea. Symmetrical chest expansion.  CV: rrr s1s2 no rgm  GI: Soft flat. L ostomy with liquid output. R mucus fistula, dusky. G tube  MSK: BUE 1+ pitting edema, no obvious joint deformity. No cyanosis  Neuro: Awake. Does not follow commands. Flaccid RUE tone. R sided facial droop, Tongue right of midline. Grimace to pain  Skin: c/d/w no rash  Resolved Hospital Problem list     Assessment & Plan:   R sided facial droop - new 1/6 RUE flaccid tone- new 1/6 -this is new on exam 1/6 AM from 1/5 afternoon. Last known "normal" not known however -- last confirmed normal 1/5 1900   P  STAT CT H   Acute encephalopathy, multifactorial P Supportive care Not on continuous sedation -- is getting enteral morphine for high ostomy output   Sepsis due to enterococcus faecalis intraabdominal abscess P Unasyn for 10 day course (stop date in place)   Acute hypoxemic respiratory failure requiring mechanical ventilation, s/p trach -failed SBT 1/6 -- significant tachypnea, accessory muscle use Bilateral pleural effusions due to profound malnutrition P Cont vent weaning efforts. Level of debility will likely continue to be limiting VAP, Pulm hygiene CPT Pleural effusion remained stable PRN CXR  AKI, likely prerenal - stable Cr  -high ostomy outpt, high RR insensible losses, third  spacing 2/2 significant malnutrition  Hyperkalemia, improved P trend UOP, renal indices minimize nephrotoxins as able MAP goal > 65   SBO Ischemic bowel perforation, s/p 2 ex-lap for resection of ischemic bowel, mucus fistula and ileostomy, G tube placement Intraabdominal absces s/p IR drain Severe protein calorie malnutrition High ileostomy output, associated bicarb wasting P CCS following EN; unfortunately not absorbing Imodium, EN morphine in effort to slow ostomy output TPN  Drain output is minimal  General surgery follow-up is appreciated  Hypertension, uncontrolled Tachybradycardia syndrome EKG with fascicular block BP more stable, and no brady >24 hrs  P Avoid beta-blocker ICU monitoring  Atrial fibrillation with RVR> resolved Not on anticoagulation because of recent surgery  Seizure  No more seizures Continue Keppra  Bladder Cancer  Anemia, no obvious source of ongoing blood loss No signs of bleeding Monitor H&H PRN Transfuse PRBC for Hgb < 7 gm/dL  Best practice (evaluated daily)  Diet: TPN, EN  Pain/Anxiety/Delirium protocol (if indicated): As needed fentanyl VAP protocol (if indicated): yes DVT prophylaxis: Subcu heparin GI prophylaxis: Pantoprazole for stress ulcer prophylaxis Glucose control: SSI  Mobility: As tolerated Disposition: ICU Family Update: Daughter [name] updated 1/6 by PCCM NP  Goals of Care:  Last date of multidisciplinary goals of care discussion: 1/4 (1/5 PCM met  with family  Family and staff present: Patient's daughter Ms. Alveta Heimlich Summary of discussion:  1/5- Palliative care met with family - -desired for ongoing aggressive medical care as offered, limited code blue Code Status: limited code blue no shock, no CPR  Labs   CBC: Recent Labs  Lab 12/15/20 0605 12/15/20 0813  WBC SPECIMEN CONTAMINATED, UNABLE TO PERFORM TEST(S). 35.4*  NEUTROABS SPECIMEN CONTAMINATED, UNABLE TO PERFORM TEST(S). 29.1*  HGB SPECIMEN CONTAMINATED,  UNABLE TO PERFORM TEST(S). 7.2*  HCT SPECIMEN CONTAMINATED, UNABLE TO PERFORM TEST(S). 23.8*  MCV SPECIMEN CONTAMINATED, UNABLE TO PERFORM TEST(S). 99.2  PLT SPECIMEN CONTAMINATED, UNABLE TO PERFORM TEST(S). 539*    Basic Metabolic Panel: Recent Labs  Lab 12/12/20 0533 12/12/20 1625 12/13/20 0524 12/14/20 0226 12/15/20 0813 12/16/20 0427 12/17/20 0443 12/17/20 0742 12/17/20 1029 12/17/20 1437 12/17/20 1826 12/18/20 0437  NA  --    < > 143 137 138 138 130* 133*  --   --   --  134*  K  --    < > 3.4* 4.5 4.7 5.0 6.6* 4.7 4.6 4.5 4.5 4.3  CL  --    < > 111 107 107 108 103 103  --   --   --  106  CO2  --    < > 19* 21* 22 23 19* 19*  --   --   --  19*  GLUCOSE  --    < > 117* 161* 199* 243* 263* 119*  --   --   --  206*  BUN  --    < > 37* 44* 47* 55* 85* 91*  --   --   --  88*  CREATININE  --    < > 1.31* 0.93 0.92 0.90 1.43* 1.51*  --   --   --  1.45*  CALCIUM  --    < > 6.2* 8.0* 8.4* 8.4* 7.9* 8.2*  --   --   --  7.8*  MG 2.1  --  1.7 2.1 2.3  --   --   --   --   --   --  2.5*  PHOS 2.9  --  2.3* 3.7 3.4  --   --   --   --   --   --  5.8*   < > = values in this interval not displayed.   GFR: Estimated Creatinine Clearance: 40.5 mL/min (A) (by C-G formula based on SCr of 1.45 mg/dL (H)). Recent Labs  Lab 12/15/20 0605 12/15/20 0813  WBC SPECIMEN CONTAMINATED, UNABLE TO PERFORM TEST(S). 35.4*    Liver Function Tests: Recent Labs  Lab 12/14/20 0226 12/15/20 0813 12/18/20 0437  AST 31 35 27  ALT 27 33 29  ALKPHOS 107 116 95  BILITOT 0.8 1.1 1.1  PROT 6.0* 6.5 5.8*  ALBUMIN 1.5* 1.7* 1.5*   No results for input(s): LIPASE, AMYLASE in the last 168 hours. No results for input(s): AMMONIA in the last 168 hours.  ABG    Component Value Date/Time   PHART 7.361 11/16/2020 2113   PCO2ART 47.6 11/20/2020 2113   PO2ART 41 (L) 11/15/2020 2113   HCO3 9.4 (L) 12/08/2020 0822   TCO2 28 11/15/2020 2113   ACIDBASEDEF 19.3 (H) 12/08/2020 0822   O2SAT 78.5 12/08/2020 0822      Coagulation Profile: No results for input(s): INR, PROTIME in the last 168 hours.  Cardiac Enzymes: No results for input(s): CKTOTAL, CKMB, CKMBINDEX, TROPONINI in the last 168 hours.  HbA1C: Hgb  A1c MFr Bld  Date/Time Value Ref Range Status  11/19/2020 10:37 AM 5.0 4.8 - 5.6 % Final    Comment:    (NOTE) Pre diabetes:          5.7%-6.4%  Diabetes:              >6.4%  Glycemic control for   <7.0% adults with diabetes   12/17/2019 08:19 AM 4.5 (L) 4.8 - 5.6 % Final    Comment:    (NOTE) Pre diabetes:          5.7%-6.4% Diabetes:              >6.4% Glycemic control for   <7.0% adults with diabetes     CBG: Recent Labs  Lab 12/17/20 1556 12/17/20 2001 12/18/20 0009 12/18/20 0412 12/18/20 0736  GLUCAP 93 154* 132* 148* 189*   CRITICAL CARE Performed by: Cristal Generous   Total critical care time: 42 minutes  Critical care time was exclusive of separately billable procedures and treating other patients.  Critical care was necessary to treat or prevent imminent or life-threatening deterioration.  Critical care was time spent personally by me on the following activities: development of treatment plan with patient and/or surrogate as well as nursing, discussions with consultants, evaluation of patient's response to treatment, examination of patient, obtaining history from patient or surrogate, ordering and performing treatments and interventions, ordering and review of laboratory studies, ordering and review of radiographic studies, pulse oximetry and re-evaluation of patient's condition.  Eliseo Gum MSN, AGACNP-BC [place] [phone] If no answer, [phone] 12/18/2020, 7:42 AM

## 2020-12-18 NOTE — Progress Notes (Signed)
  Speech Language Pathology Treatment: [place] Speaking valve  Patient Details Name: Ronald Wolf MRN: [id number] DOB: [birth year] Today's Date: 12/18/2020 Time: 0626-9485 SLP Time Calculation (min) (ACUTE ONLY): 8 min  Assessment / Plan / Recommendation Clinical Impression  Pt awake but not responsive to questions previous session for inline PMV with RT. Today pt seen for ability to participate in session for inline PMV. His eyes are open and has fleeting focused attention with therapist but does not sustain. Possibly attempted to move tongue when speaking into his right ear with given extra time to respond. He has not made progress towards goals and at presently is unable to make any functional gains. ST will discharge at this time.    HPI HPI: Pt is a 75 y.o. male admitted 12/09/2020 with abdominal pain and nausea; workup revealed SBO, s/p NGT placement. Also with AKI. Pt is now s/p multiple ex lap with revisions and placement of G-tube. Pt self-extubated 3 times, is now s/p placement of trach on 12/29. PMH includes stroke, HTN, bladder CA s/p urostomy, seizure, Hep C, anxiety, depression      SLP Plan  Discharge SLP treatment due to (comment) (lack of progress)       Recommendations                   Plan: Discharge SLP treatment due to (comment) (lack of progress)       GO                [name] 12/18/2020, 1:31 PM  Breck Coons Jillana Selph M.Ed Nurse, children's [phone] Office [phone]

## 2020-12-19 DIAGNOSIS — J9601 Acute respiratory failure with hypoxia: Secondary | ICD-10-CM | POA: Diagnosis not present

## 2020-12-19 DIAGNOSIS — E43 Unspecified severe protein-calorie malnutrition: Secondary | ICD-10-CM | POA: Diagnosis not present

## 2020-12-19 DIAGNOSIS — G9340 Encephalopathy, unspecified: Secondary | ICD-10-CM | POA: Diagnosis not present

## 2020-12-19 DIAGNOSIS — N179 Acute kidney failure, unspecified: Secondary | ICD-10-CM | POA: Diagnosis not present

## 2020-12-19 LAB — GLUCOSE, CAPILLARY
Glucose-Capillary: 123 mg/dL — ABNORMAL HIGH (ref 70–99)
Glucose-Capillary: 133 mg/dL — ABNORMAL HIGH (ref 70–99)
Glucose-Capillary: 140 mg/dL — ABNORMAL HIGH (ref 70–99)
Glucose-Capillary: 142 mg/dL — ABNORMAL HIGH (ref 70–99)
Glucose-Capillary: 153 mg/dL — ABNORMAL HIGH (ref 70–99)
Glucose-Capillary: 156 mg/dL — ABNORMAL HIGH (ref 70–99)

## 2020-12-19 LAB — BASIC METABOLIC PANEL
Anion gap: 13 (ref 5–15)
BUN: 94 mg/dL — ABNORMAL HIGH (ref 8–23)
CO2: 20 mmol/L — ABNORMAL LOW (ref 22–32)
Calcium: 7.9 mg/dL — ABNORMAL LOW (ref 8.9–10.3)
Chloride: 103 mmol/L (ref 98–111)
Creatinine, Ser: 1.6 mg/dL — ABNORMAL HIGH (ref 0.61–1.24)
GFR, Estimated: 45 mL/min — ABNORMAL LOW (ref >60)
Glucose, Bld: 179 mg/dL — ABNORMAL HIGH (ref 70–99)
Potassium: 3.3 mmol/L — ABNORMAL LOW (ref 3.5–5.1)
Sodium: 136 mmol/L (ref 135–145)

## 2020-12-19 LAB — MAGNESIUM: Magnesium: 2.7 mg/dL — ABNORMAL HIGH (ref 1.7–2.4)

## 2020-12-19 LAB — TRIGLYCERIDES: Triglycerides: 57 mg/dL (ref <150)

## 2020-12-19 MED ORDER — POTASSIUM CHLORIDE 10 MEQ/100ML IV SOLN
10.0000 meq | INTRAVENOUS | Status: AC
  Administered 2020-12-19 (×2): 10 meq via INTRAVENOUS
  Filled 2020-12-19 (×2): qty 100

## 2020-12-19 MED ORDER — TRACE MINERALS CU-MN-SE-ZN 300-55-60-3000 MCG/ML IV SOLN
INTRAVENOUS | Status: AC
  Filled 2020-12-19: qty 800

## 2020-12-19 NOTE — Progress Notes (Signed)
Progress Note: General Surgery Service   Chief Complaint/Subjective: No events overnight  Objective: Vital signs in last 24 hours: Temp:  [98.7 F (37.1 C)-100.4 F (38 C)] 100.4 F (38 C) (01/07 0800) Pulse Rate:  [94-108] 100 (01/07 0800) Resp:  [25-35] 27 (01/07 0800) BP: (104-133)/(46-56) 114/49 (01/07 0800) SpO2:  [89 %-100 %] 100 % (01/07 0800) FiO2 (%):  [40 %] 40 % (01/07 0731) Weight:  [64.1 kg] 64.1 kg (01/07 0431) Last BM Date: (P)  (illeostomy)  Intake/Output from previous day: 01/06 0701 - 01/07 0700 In: 3867.4 [I.V.:2537.3; NG/GT:930; IV Piggyback:400.2] Out: 3110 [Urine:1815; Drains:20; ZDGLO:7564] Intake/Output this shift: Total I/O In: 1329.9 [I.V.:109.9; NG/GT:1220] Out: -   Gen: minimal responsiveness  Resp: ventilated  Card: tachycardic  Abd: LUQ g tube in position, LLQ ostomy with liquid output, right sided mucous fistulas pink and patent  Lab Results: CBC  No results for input(s): WBC, HGB, HCT, PLT in the last 72 hours. BMET Recent Labs    12/18/20 0437 12/19/20 0503  NA 134* 136  K 4.3 3.3*  CL 106 103  CO2 19* 20*  GLUCOSE 206* 179*  BUN 88* 94*  CREATININE 1.45* 1.60*  CALCIUM 7.8* 7.9*   PT/INR No results for input(s): LABPROT, INR in the last 72 hours. ABG No results for input(s): PHART, HCO3 in the last 72 hours.  Invalid input(s): PCO2, PO2  Anti-infectives: Anti-infectives (From admission, onward)   Start     Dose/Rate Route Frequency Ordered Stop   12/12/20 1800  Ampicillin-Sulbactam (UNASYN) 3 g in sodium chloride 0.9 % 100 mL IVPB        3 g 200 mL/hr over 30 Minutes Intravenous Every 6 hours 12/12/20 1101 12/22/20 1759   12/08/20 1300  vancomycin (VANCOCIN) IVPB 1000 mg/200 mL premix  Status:  Discontinued        1,000 mg 200 mL/hr over 60 Minutes Intravenous Every 24 hours 12/08/20 0845 12/12/20 1059   12/08/20 0100  vancomycin (VANCOREADY) IVPB 500 mg/100 mL  Status:  Discontinued        500 mg 100 mL/hr over  60 Minutes Intravenous Every 12 hours 12/07/20 1130 12/07/20 1337   12/07/20 1215  vancomycin (VANCOREADY) IVPB 1250 mg/250 mL        1,250 mg 166.7 mL/hr over 90 Minutes Intravenous  Once 12/07/20 1124 12/07/20 1336   12/06/20 1030  anidulafungin (ERAXIS) 100 mg in sodium chloride 0.9 % 100 mL IVPB  Status:  Discontinued       "Followed by" Linked Group Details   100 mg 78 mL/hr over 100 Minutes Intravenous Every 24 hours 12/05/20 0939 12/13/20 1230   12/05/20 1030  anidulafungin (ERAXIS) 200 mg in sodium chloride 0.9 % 200 mL IVPB       "Followed by" Linked Group Details   200 mg 78 mL/hr over 200 Minutes Intravenous  Once 12/05/20 0939 12/05/20 1618   12/04/20 1500  piperacillin-tazobactam (ZOSYN) IVPB 3.375 g  Status:  Discontinued        3.375 g 12.5 mL/hr over 240 Minutes Intravenous Every 8 hours 12/04/20 0818 12/12/20 1059   12/04/20 0915  piperacillin-tazobactam (ZOSYN) IVPB 3.375 g        3.375 g 100 mL/hr over 30 Minutes Intravenous  Once 12/04/20 0818 12/04/20 1207   11/14/2020 0945  cefoTEtan (CEFOTAN) 2 g in sodium chloride 0.9 % 100 mL IVPB  Status:  Discontinued        2 g 200 mL/hr over 30 Minutes Intravenous To  Surgery 11/16/2020 0936 12/02/2020 1057   11/29/2020 0900  cefoTEtan (CEFOTAN) 2 g in sodium chloride 0.9 % 100 mL IVPB  Status:  Discontinued        2 g 200 mL/hr over 30 Minutes Intravenous  Once 12/05/2020 0833 12/10/2020 0936   11/19/20 1130  piperacillin-tazobactam (ZOSYN) IVPB 3.375 g        3.375 g 12.5 mL/hr over 240 Minutes Intravenous Every 8 hours 11/19/20 1017 11/17/2020 2359   11/19/20 1115  fluconazole (DIFLUCAN) IVPB 200 mg        200 mg 100 mL/hr over 60 Minutes Intravenous Every 24 hours 11/19/20 1017 11/21/20 1104   11/19/20 0915  Ampicillin-Sulbactam (UNASYN) 3 g in sodium chloride 0.9 % 100 mL IVPB  Status:  Discontinued        3 g 200 mL/hr over 30 Minutes Intravenous Every 12 hours 11/19/20 0826 11/19/20 0949      Medications: Scheduled Meds: .  chlorhexidine gluconate (MEDLINE KIT)  15 mL Mouth Rinse BID  . Chlorhexidine Gluconate Cloth  6 each Topical Daily  . diphenoxylate-atropine  5 mL Per Tube QID  . feeding supplement (PROSource TF)  45 mL Per Tube TID  . fiber  1 packet Per Tube TID  . free water  200 mL Per Tube Q4H  . heparin injection (subcutaneous)  5,000 Units Subcutaneous Q8H  . hydrALAZINE  100 mg Per Tube Q8H  . insulin aspart  0-15 Units Subcutaneous Q4H  . levETIRAcetam  750 mg Per Tube BID  . loperamide HCl  2 mg Per Tube Q4H  . mouth rinse  15 mL Mouth Rinse 10 times per day  . morphine  5 mg Per Tube Q6H  . Opium  4 drop Per Tube Q6H  . pantoprazole sodium  40 mg Per Tube QHS  . sodium chloride flush  10-40 mL Intracatheter Q12H  . sodium chloride flush  5 mL Intracatheter Q8H   Continuous Infusions: . sodium chloride Stopped (12/11/2020 1358)  . sodium chloride 10 mL/hr at 12/19/20 0800  . ampicillin-sulbactam (UNASYN) IV Stopped (12/19/20 0549)  . feeding supplement (OSMOLITE 1.5 CAL) 1,000 mL (12/18/20 2132)  . TPN ADULT (ION) 100 mL/hr at 12/19/20 0800   PRN Meds:.sodium chloride, acetaminophen **OR** acetaminophen, fentaNYL (SUBLIMAZE) injection, hydrALAZINE, ondansetron (ZOFRAN) IV, sodium chloride flush  Assessment/Plan: s/p Procedure(s): EXPLORATORY LAPAROTOMY AND ABDOMINAL CLOSURE ILEOSTOMY WITH MUCOUS FISTULA CREATION 11/15/2020 -output stable at 1200 ml -no expected change in intestinal function    LOS: 37 days   Mickeal Skinner, MD [facility]

## 2020-12-19 NOTE — Progress Notes (Signed)
Supervising Physician: Markus Daft  Patient Status:  [facility] - In-pt  Chief Complaint: Intra-abdominal fluid collection  Subjective: Remains on trach support.  Opens eyes, but no interaction.  Drain in place with purulent-appearing output.   Allergies: Iohexol  Medications: Prior to Admission medications   Medication Sig Start Date End Date Taking? Authorizing Provider  amLODipine (NORVASC) 10 MG tablet Take 1 tablet (10 mg total) by mouth daily. 02/11/20  Yes Mosie Lukes, MD  atorvastatin (LIPITOR) 80 MG tablet TAKE 1 TABLET(80 MG) BY MOUTH DAILY AT 6 PM 02/11/20  Yes Mosie Lukes, MD  carvedilol (COREG) 3.125 MG tablet Take 1 tablet (3.125 mg total) by mouth 2 (two) times daily with a meal. 02/11/20  Yes Mosie Lukes, MD  clopidogrel (PLAVIX) 75 MG tablet TAKE 1 TABLET(75 MG) BY MOUTH DAILY Patient taking differently: Take 75 mg by mouth daily. TAKE 1 TABLET(75 MG) BY MOUTH DAILY 08/21/20  Yes Mosie Lukes, MD  folic acid (FOLVITE) 1 MG tablet Take 1 tablet (1 mg total) by mouth daily. 02/11/20  Yes Mosie Lukes, MD  levETIRAcetam (KEPPRA) 750 MG tablet TAKE 1 TABLET(750 MG) BY MOUTH TWICE DAILY Patient taking differently: Take 750 mg by mouth 2 (two) times daily.  06/30/20  Yes Mosie Lukes, MD  mirtazapine (REMERON) 15 MG tablet Take 15 mg by mouth at bedtime. 11/04/20  Yes [provider]  tetrahydrozoline-zinc (VISINE-AC) 0.05-0.25 % ophthalmic solution Place 1 drop into both eyes daily as needed (dry eyes).    Yes [provider]     Vital Signs: BP (!) 93/45   Pulse 96   Temp (!) 100.4 F (38 C) (Oral) Comment: MD notified, Tyleonol given  Resp (!) 31   Ht 5\' 11"  (1.803 m)   Wt 141 lb 5 oz (64.1 kg)   SpO2 100%   BMI 19.71 kg/m   Physical Exam  NAD, not interactive, eyes open Abdomen: Large midline wound. Drain in place. Insertion site intact.   Bloody, purulent-appearing output. 20 mL documented in last 24 hrs   Imaging: CT HEAD  WO CONTRAST  Result Date: 12/18/2020 CLINICAL DATA:  Altered mental status EXAM: CT HEAD WITHOUT CONTRAST TECHNIQUE: Contiguous axial images were obtained from the base of the skull through the vertex without intravenous contrast. COMPARISON:  12/16/2019 FINDINGS: Brain: Encephalomalacia changes are noted in the distribution of left middle cerebral artery consistent with prior infarct. These are stable in appearance from the prior exam. Some gyral calcifications are noted. Atrophic changes and chronic white matter ischemic changes are noted as well. Left basal ganglia infarcts are seen stable in appearance from the prior exam. No findings to suggest acute hemorrhage or acute infarction are seen. Vascular: No hyperdense vessel or unexpected calcification. Skull: Normal. Negative for fracture or focal lesion. Sinuses/Orbits: No acute finding. Other: None. IMPRESSION: Chronic atrophic and ischemic changes. No acute abnormality is noted. Electronically Signed   By: Inez Catalina M.D.   On: 12/18/2020 08:47    Labs:  CBC: Recent Labs    12/08/20 0428 12/10/20 0500 12/15/20 0605 12/15/20 0813  WBC 23.8* 25.4* SPECIMEN CONTAMINATED, UNABLE TO PERFORM TEST(S). 35.4*  HGB 7.1* 7.1* SPECIMEN CONTAMINATED, UNABLE TO PERFORM TEST(S). 7.2*  HCT 23.7* 22.9* SPECIMEN CONTAMINATED, UNABLE TO PERFORM TEST(S). 23.8*  PLT 245 327 SPECIMEN CONTAMINATED, UNABLE TO PERFORM TEST(S). 539*    COAGS: No results for input(s): INR, APTT in the last 8760 hours.  BMP: Recent Labs  12/21/19 0439 12/27/19 8115 12/31/19 0221 11/17/2020 1830 12/17/20 0443 12/17/20 0742 12/17/20 1029 12/17/20 1437 12/17/20 1826 12/18/20 0437 12/19/20 0503  NA 140 142 138   < > 130* 133*  --   --   --  134* 136  K 3.8 4.3 4.4   < > 6.6* 4.7   < > 4.5 4.5 4.3 3.3*  CL 110 108 107   < > 103 103  --   --   --  106 103  CO2 19* 25 21*   < > 19* 19*  --   --   --  19* 20*  GLUCOSE 98 98 113*   < > 263* 119*  --   --   --  206* 179*   BUN 15 22 34*   < > 85* 91*  --   --   --  88* 94*  CALCIUM 8.5* 9.0 9.0   < > 7.9* 8.2*  --   --   --  7.8* 7.9*  CREATININE 0.87 1.02 1.21   < > 1.43* 1.51*  --   --   --  1.45* 1.60*  GFRNONAA >60 >60 59*   < > 51* 48*  --   --   --  51* 45*  GFRAA >60 >60 >60  --   --   --   --   --   --   --   --    < > = values in this interval not displayed.    LIVER FUNCTION TESTS: Recent Labs    12/11/20 0329 12/14/20 0226 12/15/20 0813 12/18/20 0437  BILITOT 0.9 0.8 1.1 1.1  AST 22 31 35 27  ALT 19 27 33 29  ALKPHOS 79 107 116 95  PROT 5.3* 6.0* 6.5 5.8*  ALBUMIN 1.3* 1.5* 1.7* 1.5*    Assessment and Plan: SBO s/p ex lap with SBR, placement of a gastrostomy tube12/7 Takeback 12/12 by Dr. Zenia Resides for necrotic SB with resection of additional 60-70cm of SB inclusive of prior anastomosis in discontinuity with open abdomen.  S/P ex lap, ileostomy, mucous fistula, closure 12/15 Abscess by CT 12/23 s/p perc drain on 12/24 by Dr. Vernard Gambles Resting comfortably on trach support.  Tmax 100.4 this AM. Drain in place without issues. Ongoing significant output.  No changes to care plan today-- per family, continue with current level of support. Continue current drain management.  Electronically Signed: Docia Barrier, PA 12/19/2020, 2:46 PM   I spent a total of 15 Minutes at the the patient's bedside AND on the patient's hospital floor or unit, greater than 50% of which was counseling/coordinating care for intra-abdominal fluid collection.

## 2020-12-19 NOTE — Progress Notes (Signed)
NAME:  Ronald Wolf, MRN:  [id number], DOB:  1946-06-12, LOS: 48 ADMISSION DATE:  11/17/2020, CONSULTATION DATE:  12/7 REFERRING MD:  Cyndia Skeeters, CHIEF COMPLAINT:  Abdominal pain   Brief History:  Ronald Wolf is a 75 yo M w/ PMH of CVA, Seizures, Dementia, Bladder ca s/p urostomy, tobacco use, right glottic carcinoma s/p excision presenting with sbo. Found to have ischemic bowel requiring ex-lap 12/7.  Course complicated by septic shock, acute respiratory failure and AKI return to the OR 12/12 for necrotic small bowel resection of additional 70 cm inclusive of prior anastomosis 12/15 abdominal closure with ileostomy He has had a prolonged ICU stay with ventilator dependence, had a tracheostomy on 12/29  Past Medical History:  CVA, Dementia, Bladder CA s/p nephrostomy, tobacco use, right glottic carcinoma s/p excision, seizures.  Significant Hospital Events:  11/29/2020 Admission 11/25/2020 OR 11/16/2020 Admit to ICU 12/10 Brief SVT with associated hypertension. Self limited Given fentanyl, metop and hydral for HTN 12/11 Good urine output with Lasix , failed extubation, reintubated within 2 hours due to inability to clear secretions 12/12 return OR for enteric leak-- found to have bowel necrosis. Left in discontinuity  12/13 self extubated, reintubated. On pressors. Family meeting with PCCM and CCS to discuss possible next steps.  12/15-was back in OR, abdominal closure, RUQ mucus fistula and L ileostomy  12/19: No significant overnight events, high ileostomy output 12/21: scheduled for family meeting. Worse hypernatremia, continued high ileostomy output.  Attempted PSV/CPAP wean, pulled volumes of high 100s -low/mid 200s with tachypnea. Placed back of full support.  12/22 ongoing high output GI/ ileostomy, TPN started; weaned 12/5 all day; off fentanyl gtt, remains on precedex  12/24 IR drain , 1 unit PRBC transfusion for hemoglobin 6.5 12/26 added vancomycin for GPC in blood cultures and RIC suggesting  MRSA 12/29 tracheostomy 12/31 Palliative discussion > family understanding that his situation is critical and he has not made much improvement  1/6 ongoing palliative conversations. No progress from pt standpoint. R sided facial droop and not following commands, flaccid RUE tone -- STAT CT H without acute abnormality, large old L MCA stroke   Consults:  General Surgery PCCM Palliative Care  Procedures:  11/25/2020 Ex-lap, G-tube placement ETT 12/7 >> 12/11, >> 12/15- back to OR for abdominal closure, exploratory laparotomy  12/24 CT-guided right pelvic abscess drain 12/29 trach  Significant Diagnostic Tests:  CT abd/pelvis 12/2 > High-grade distal small bowel obstruction, possibly due to adhesion. Mild ascites and diffuse mesenteric edema. No evidence of focal inflammatory process or abscess. Small bilateral pleural effusions and mild right lower lobe Atelectasis. CT abdomen/pelvis 12/23 Interval development of a large loculated collection in the low anterior abdomen and pelvis measuring approximately 6.1 x 11.3 x12.1 cm in size subjacent to the operative site. Few foci of internal gas are present. , no frank spillage of enteric contrast   CT H 1/6 > no acute intracranial abnormality. Chronic L MCA changes from prior L MCA infarct. Atrophic and chronic white matter ischemic changes.  Stable L basal ganglia infarcts   Micro Data:  11/15/2020 COVID/Flu negative 12/7 BA: Few GNR rare GPC -- predominantly PMN  12/7 BCx> Neg 12/11 respiratory -no organism in tracheal aspirate 12/24 BC >> RIC showing both staph epidermis 1/4 12/24 abscess >> E. Faecalis, B fragilis (B lactamase) 12/27 trach aspirate>> normal flora  Antimicrobials:  Fluc 12/8>> 12/10 Zosyn 12/8> 12/12 cefotetain 12/15>> off Zosyn 12/23 >> 2/30 eraxis 12/24 >> 1/2 Vancomycin 12/26  Unasyn 12/31 >  Interim History / Subjective:  CT H 1/6 - no acute abnormality  I see palliative note 1/6 in which family has indicated " 1  more week"   NAEO  Continues to have high ileostomy output. Mental status remains poor today, is not interactive or engaged   Again failed PSV/CPAP with nearly immediate accessory muscle use and tachypnea RR > 35 Place back or PRVC    Objective   Blood pressure (!) 116/48, pulse 100, temperature 99.4 F (37.4 C), temperature source Oral, resp. rate (!) 26, height 5' 11"  (1.803 m), weight 64.1 kg, SpO2 96 %.    Vent Mode: PRVC FiO2 (%):  [40 %] 40 % Set Rate:  [14 bmp] 14 bmp Vt Set:  [500 mL-600 mL] 600 mL PEEP:  [5 cmH20] 5 cmH20 Plateau Pressure:  [19 cmH20-23 cmH20] 19 cmH20   Intake/Output Summary (Last 24 hours) at 12/19/2020 0718 Last data filed at 12/19/2020 0700 Gross per 24 hour  Intake 3867.43 ml  Output 3110 ml  Net 757.43 ml   Filed Weights   12/17/20 0500 12/18/20 0411 12/19/20 0431  Weight: 64.1 kg 64.1 kg 64.1 kg    Examination: General: Frail, critically ill appearing older adult M trach/vent NAD  HENT: [place] temporal muscle wasting. Anicteric sclera. Trach secure.  PULM: Symmetrical chest expansion, scalene muscle recruitment on PSV/CPAP.  CV: rrr s1s2 cap refill < 3 sec  GI: R sided mucus fistula. Midline abdominal incision with granulomatous tissue, no exudate or purulence. LUQ Gtube. LLQ ileostomy with liquid output GU: RUQ urostomy. Edematous penis  MSK: BUE pitting edema. No obvious joint deformity. Muscle atrophy BUE BLE  Neuro: Awake, does not follow commands. Grimaces to noxious stimuli. Mild r sided facial droop and tongue is R of midline  Skin: c/d/w   Resolved Hospital Problem list     Assessment & Plan:   Acute encephalopathy, multifactorial Prior L MCA CVA -New R sided facial droop/ flaccid RUE tone 1/6 -sent for CT H -- no new abnormality  P Supportive care Not on continuous sedation. Is getting opioid meds in attempt to slow GI transit    Sepsis due to enterococcus faecalis IAA P Unasyn for 10 day course (stop date in place)     Acute hypoxemic respiratory failure requiring mechanical ventilation S/p trach VDRF  -failing SBTs 1/6, 1/7 due to accessory muscle use -- is profoundly deconditioned  Bilateral pleural effusions due to malnutrition  P Cont vent wean efforts -- anticipate that debility will be ongoing limiting factor  CPT, VAP, pulm hygiene  PRN CXR -- effusions have been stable, no intervention indicated    AKI -interval increase in Cr from 1.45 to 1.6 (1/7) -suspect this is prerenal -- high ostomy output,insensible losses, third spacing  Hypokalemia, mild  P trend UOP, renal indices minimize nephrotoxins as able MAP goal > 65  Cautious supplementation of K via IVPB; TPN formulary adjustments per pharmacist  SBO Ischemic bowel perf s/p ex-lap x 2 for resection, mucus fistula ileostomy, G tube placement  IAA, now s/p drain  Severe protein calorie malnutrition High ileostomy output, enteral malabsorption  P CCS following EN; unfortunately not absorbing -- doubt this will improve  Imodium, opium, EN morphine in effort to slow ostomy output TPN  General surgery follow-up is appreciated  Hypertension, uncontrolled Tachybradycardia syndrome EKG with fascicular block BP more stable, and no brady >24 hrs  P Avoid beta-blocker ICU monitoring  Atrial fibrillation with RVR> resolved Not on anticoagulation because of recent surgery  Seizure  No more seizures Continue Keppra  Bladder Cancer Routine urostomy care  Anemia, no obvious source of ongoing blood loss No signs of bleeding Monitor H&H PRN Transfuse PRBC for Hgb < 7 gm/dL  Goals of Care Ongoing GOC convos (many interdisciplinary meetings, now being followed by palliative care) Poor prognosis has been communicated to family by several providers Family seems to be having hard time accepting poor prognosis, is indicating "1 more week" but this seems to be recurring pattern. Not sure that family has clear idea of what they are  hoping will occur with more time  Best practice (evaluated daily)  Diet: TPN, EN  Pain/Anxiety/Delirium protocol (if indicated): VAP protocol (if indicated): yes DVT prophylaxis: Subcu heparin GI prophylaxis: Pantoprazole for stress ulcer prophylaxis Glucose control: SSI  Mobility: As tolerated Disposition: ICU Family Update: Family at bedside 1/7  Goals of Care:  Last date of multidisciplinary goals of care discussion: 1/4 (1/5 PCM met with family  Family and staff present: Patient's daughter Ms. Alveta Heimlich Summary of discussion:  1/5- Palliative care met with family - -desired for ongoing aggressive medical care as offered, limited code blue Code Status: limited code blue no shock, no CPR  Labs   CBC: Recent Labs  Lab 12/15/20 0605 12/15/20 0813  WBC SPECIMEN CONTAMINATED, UNABLE TO PERFORM TEST(S). 35.4*  NEUTROABS SPECIMEN CONTAMINATED, UNABLE TO PERFORM TEST(S). 29.1*  HGB SPECIMEN CONTAMINATED, UNABLE TO PERFORM TEST(S). 7.2*  HCT SPECIMEN CONTAMINATED, UNABLE TO PERFORM TEST(S). 23.8*  MCV SPECIMEN CONTAMINATED, UNABLE TO PERFORM TEST(S). 99.2  PLT SPECIMEN CONTAMINATED, UNABLE TO PERFORM TEST(S). 539*    Basic Metabolic Panel: Recent Labs  Lab 12/13/20 0524 12/14/20 0226 12/15/20 0813 12/16/20 0427 12/17/20 0443 12/17/20 0742 12/17/20 1029 12/17/20 1437 12/17/20 1826 12/18/20 0437 12/19/20 0503  NA 143 137 138 138 130* 133*  --   --   --  134* 136  K 3.4* 4.5 4.7 5.0 6.6* 4.7 4.6 4.5 4.5 4.3 3.3*  CL 111 107 107 108 103 103  --   --   --  106 103  CO2 19* 21* 22 23 19* 19*  --   --   --  19* 20*  GLUCOSE 117* 161* 199* 243* 263* 119*  --   --   --  206* 179*  BUN 37* 44* 47* 55* 85* 91*  --   --   --  88* 94*  CREATININE 1.31* 0.93 0.92 0.90 1.43* 1.51*  --   --   --  1.45* 1.60*  CALCIUM 6.2* 8.0* 8.4* 8.4* 7.9* 8.2*  --   --   --  7.8* 7.9*  MG 1.7 2.1 2.3  --   --   --   --   --   --  2.5* 2.7*  PHOS 2.3* 3.7 3.4  --   --   --   --   --   --  5.8*  --     GFR: Estimated Creatinine Clearance: 36.7 mL/min (A) (by C-G formula based on SCr of 1.6 mg/dL (H)). Recent Labs  Lab 12/15/20 0605 12/15/20 0813  WBC SPECIMEN CONTAMINATED, UNABLE TO PERFORM TEST(S). 35.4*    Liver Function Tests: Recent Labs  Lab 12/14/20 0226 12/15/20 0813 12/18/20 0437  AST 31 35 27  ALT 27 33 29  ALKPHOS 107 116 95  BILITOT 0.8 1.1 1.1  PROT 6.0* 6.5 5.8*  ALBUMIN 1.5* 1.7* 1.5*   No results for input(s): LIPASE, AMYLASE in the last 168 hours. No  results for input(s): AMMONIA in the last 168 hours.  ABG    Component Value Date/Time   PHART 7.361 11/19/2020 2113   PCO2ART 47.6 12/06/2020 2113   PO2ART 41 (L) 12/04/2020 2113   HCO3 9.4 (L) 12/08/2020 0822   TCO2 28 12/08/2020 2113   ACIDBASEDEF 19.3 (H) 12/08/2020 0822   O2SAT 78.5 12/08/2020 0822     Coagulation Profile: No results for input(s): INR, PROTIME in the last 168 hours.  Cardiac Enzymes: No results for input(s): CKTOTAL, CKMB, CKMBINDEX, TROPONINI in the last 168 hours.  HbA1C: Hgb A1c MFr Bld  Date/Time Value Ref Range Status  11/19/2020 10:37 AM 5.0 4.8 - 5.6 % Final    Comment:    (NOTE) Pre diabetes:          5.7%-6.4%  Diabetes:              >6.4%  Glycemic control for   <7.0% adults with diabetes   12/17/2019 08:19 AM 4.5 (L) 4.8 - 5.6 % Final    Comment:    (NOTE) Pre diabetes:          5.7%-6.4% Diabetes:              >6.4% Glycemic control for   <7.0% adults with diabetes     CBG: Recent Labs  Lab 12/18/20 1139 12/18/20 1546 12/18/20 1947 12/18/20 2331 12/19/20 0331  GLUCAP 199* 161* 140* 152* 156*   CRITICAL CARE Performed by: Cristal Generous   Total critical care time: 37 minutes  Critical care time was exclusive of separately billable procedures and treating other patients.  Critical care was necessary to treat or prevent imminent or life-threatening deterioration.  Critical care was time spent personally by me on the following  activities: development of treatment plan with patient and/or surrogate as well as nursing, discussions with consultants, evaluation of patient's response to treatment, examination of patient, obtaining history from patient or surrogate, ordering and performing treatments and interventions, ordering and review of laboratory studies, ordering and review of radiographic studies, pulse oximetry and re-evaluation of patient's condition.  Eliseo Gum MSN, AGACNP-BC [place] [phone] If no answer, [phone] 12/19/2020, 10:00 AM

## 2020-12-19 NOTE — Progress Notes (Signed)
PHARMACY - TOTAL PARENTERAL NUTRITION CONSULT NOTE  Indication: Small bowel obstruction, prolonged ileus  Patient Measurements: Height: 5\' 11"  (180.3 cm) Weight: 61.2 kg (134 lb 14.7 oz) IBW/kg (Calculated) : 75.3 TPN AdjBW (KG): 60.8 Body mass index is 18.82 kg/m. Usual Weight: 137 lbs  Assessment: 74 YOM presented 11/19/2020 with nausea, vomiting, abdominal pain after leaving AMA at [facility] with known SBO. Patient has been on bowel rest with NG tube decompression. Patient continued to have persistent SBO on imaging with no evidence of improvement, now s/p ex-lap with LoA, SBR and placement of Gtube on 12-12-2020. Pharmacy consulted for TPN management - patient received TPN 11/19/20 through 11/29/20.  Reconsulted to resume TPN on 12/2. TF currently at 30 mL/hr given high ostomy output and concern for tube feeds not absorbing. Patient on loperamide, lomotil, tincture of opium and fiber without improvement in output.   Glucose / Insulin: no hx DM - CBGs <180  - utilized 17 units mSSI/24hrs Electrolytes: K down to 3.3 (removed in TPN), CO2 up to 20, phos high 5.8 (last 1/6), mag high 2.7; others WNL Renal: Scr up to 1.6, BUN up to 94 LFTs / TGs: LFTs / Tbili / TG wnl Prealbumin / albumin: Prealbumin 7.7 > 17.9, albumin 1.5 Intake / Output; MIVF: UOP 1.5 ml/kg/hr, ileostomy output/24h: 1374mL (on fiber, opium tincture, loperamide and unlikely to improve per Surgery), drain O/P 73ml, net +15L this admit GI Imaging: 12/11 DG Abd - stomach gas-filled, without distention, no obvious free air 12/23 CT abd pelvis - large loculated collection in low abd/pelvis, few foci of internal gas present, new RUQ ileostomy, thickened distal SB/colon, subtotal atelectatic collapse of LLL, partial collapse of RLL, small R pleural effusion, body wall edema Surgeries / Procedures: 12/12: re-exploration and washout with new copious drainage from incision concerning for enteric leak vs. enterotomy >> now s/p, bowel necrosis  resected 12/15: ex-lap with ileostomy with fistula creation, abdominal closure 12/24 Pelvic abscess drain 12/29 - trach placed  Central access: PICC placed 2020-12-12 TPN start date: 11/19/20>>11/29/20; restart 12/03/20 >>  Nutritional Goals (updated per RD rec 12/30): 2300-2700 kCal, 120-135g protein, >2L fluid per day  Goal TPN rate of 100 cc/hr will provide 120g AA, 432g CHO, and 36g lipids (15% of total kCal due to [employer]) for a total of 2308 kcal, meeting 100% of needs  Current Nutrition:  TPN Osmolite 1.5 at 30 ml/hr (goal rate 65 ml/hr) - not advancing rate d/t ileostomy output ProSource TF 45 ml TID (each 11g AA and 40 kcal)  Plan:  Continue TPN at goal rate 100 ml/hr at 1800. TPN will provide 120g protein, 432g CHO, 36g SMOF lipids (~15% of total kcal due to national shortage), and 2308 kcal - meeting 100% of estimated needs. 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 Family wants to pursue aggressive care for now but [place] discussions are ongoing.   [name], PharmD, BCPS Please check AMION for all [place] contact numbers Clinical Pharmacist 12/19/2020 10:13 AM

## 2020-12-20 ENCOUNTER — Inpatient Hospital Stay (HOSPITAL_COMMUNITY): Payer: Medicare PPO

## 2020-12-20 DIAGNOSIS — K651 Peritoneal abscess: Secondary | ICD-10-CM | POA: Diagnosis not present

## 2020-12-20 DIAGNOSIS — J9601 Acute respiratory failure with hypoxia: Secondary | ICD-10-CM | POA: Diagnosis not present

## 2020-12-20 DIAGNOSIS — N179 Acute kidney failure, unspecified: Secondary | ICD-10-CM | POA: Diagnosis not present

## 2020-12-20 LAB — BASIC METABOLIC PANEL
Anion gap: 11 (ref 5–15)
BUN: 106 mg/dL — ABNORMAL HIGH (ref 8–23)
CO2: 19 mmol/L — ABNORMAL LOW (ref 22–32)
Calcium: 7.4 mg/dL — ABNORMAL LOW (ref 8.9–10.3)
Chloride: 103 mmol/L (ref 98–111)
Creatinine, Ser: 1.74 mg/dL — ABNORMAL HIGH (ref 0.61–1.24)
GFR, Estimated: 41 mL/min — ABNORMAL LOW (ref >60)
Glucose, Bld: 184 mg/dL — ABNORMAL HIGH (ref 70–99)
Potassium: 3.1 mmol/L — ABNORMAL LOW (ref 3.5–5.1)
Sodium: 133 mmol/L — ABNORMAL LOW (ref 135–145)

## 2020-12-20 LAB — MAGNESIUM: Magnesium: 2.8 mg/dL — ABNORMAL HIGH (ref 1.7–2.4)

## 2020-12-20 LAB — CBC
HCT: 18.4 % — ABNORMAL LOW (ref 39.0–52.0)
Hemoglobin: 5.6 g/dL — CL (ref 13.0–17.0)
MCH: 31.3 pg (ref 26.0–34.0)
MCHC: 30.4 g/dL (ref 30.0–36.0)
MCV: 102.8 fL — ABNORMAL HIGH (ref 80.0–100.0)
Platelets: 395 10*3/uL (ref 150–400)
RBC: 1.79 MIL/uL — ABNORMAL LOW (ref 4.22–5.81)
RDW: 21.6 % — ABNORMAL HIGH (ref 11.5–15.5)
WBC: 25.1 10*3/uL — ABNORMAL HIGH (ref 4.0–10.5)
nRBC: 1.4 % — ABNORMAL HIGH (ref 0.0–0.2)

## 2020-12-20 LAB — GLUCOSE, CAPILLARY
Glucose-Capillary: 147 mg/dL — ABNORMAL HIGH (ref 70–99)
Glucose-Capillary: 156 mg/dL — ABNORMAL HIGH (ref 70–99)
Glucose-Capillary: 154 mg/dL — ABNORMAL HIGH (ref 70–99)
Glucose-Capillary: 135 mg/dL — ABNORMAL HIGH (ref 70–99)
Glucose-Capillary: 170 mg/dL — ABNORMAL HIGH (ref 70–99)
Glucose-Capillary: 165 mg/dL — ABNORMAL HIGH (ref 70–99)

## 2020-12-20 LAB — PHOSPHORUS: Phosphorus: 4.7 mg/dL — ABNORMAL HIGH (ref 2.5–4.6)

## 2020-12-20 LAB — PREPARE RBC (CROSSMATCH)

## 2020-12-20 LAB — HEMOGLOBIN AND HEMATOCRIT, BLOOD
HCT: 25.6 % — ABNORMAL LOW (ref 39.0–52.0)
Hemoglobin: 8.5 g/dL — ABNORMAL LOW (ref 13.0–17.0)

## 2020-12-20 MED ORDER — HYDRALAZINE HCL 50 MG PO TABS
50.0000 mg | ORAL_TABLET | Freq: Three times a day (TID) | ORAL | Status: DC
  Administered 2020-12-20 – 2021-01-02 (×30): 50 mg
  Filled 2020-12-20 (×35): qty 1

## 2020-12-20 MED ORDER — SODIUM CHLORIDE 0.9% IV SOLUTION: Freq: Once | INTRAVENOUS | Status: AC

## 2020-12-20 MED ORDER — FREE WATER
200.0000 mL | Freq: Three times a day (TID) | Status: DC
  Administered 2020-12-20 – 2020-12-29 (×27): 200 mL

## 2020-12-20 MED ORDER — POTASSIUM CHLORIDE 10 MEQ/50ML IV SOLN
10.0000 meq | INTRAVENOUS | Status: AC
  Administered 2020-12-20 (×4): 10 meq via INTRAVENOUS
  Filled 2020-12-20 (×4): qty 50

## 2020-12-20 MED ORDER — TRACE MINERALS CU-MN-SE-ZN 300-55-60-3000 MCG/ML IV SOLN
INTRAVENOUS | Status: AC
  Filled 2020-12-20: qty 800

## 2020-12-20 NOTE — Progress Notes (Signed)
Supervising Physician: Daryll Brod  Patient Status:  [facility] - In-pt  Chief Complaint: Intra-abdominal fluid collection  Subjective: Remains on trach/vent support. Drain in place with purulent-appearing output to gravity drainage.   Allergies: Iohexol  Medications: Prior to Admission medications   Medication Sig Start Date End Date Taking? Authorizing Provider  amLODipine (NORVASC) 10 MG tablet Take 1 tablet (10 mg total) by mouth daily. 02/11/20  Yes Mosie Lukes, MD  atorvastatin (LIPITOR) 80 MG tablet TAKE 1 TABLET(80 MG) BY MOUTH DAILY AT 6 PM 02/11/20  Yes Mosie Lukes, MD  carvedilol (COREG) 3.125 MG tablet Take 1 tablet (3.125 mg total) by mouth 2 (two) times daily with a meal. 02/11/20  Yes Mosie Lukes, MD  clopidogrel (PLAVIX) 75 MG tablet TAKE 1 TABLET(75 MG) BY MOUTH DAILY Patient taking differently: Take 75 mg by mouth daily. TAKE 1 TABLET(75 MG) BY MOUTH DAILY 08/21/20  Yes Mosie Lukes, MD  folic acid (FOLVITE) 1 MG tablet Take 1 tablet (1 mg total) by mouth daily. 02/11/20  Yes Mosie Lukes, MD  levETIRAcetam (KEPPRA) 750 MG tablet TAKE 1 TABLET(750 MG) BY MOUTH TWICE DAILY Patient taking differently: Take 750 mg by mouth 2 (two) times daily.  06/30/20  Yes Mosie Lukes, MD  mirtazapine (REMERON) 15 MG tablet Take 15 mg by mouth at bedtime. 11/04/20  Yes [provider]  tetrahydrozoline-zinc (VISINE-AC) 0.05-0.25 % ophthalmic solution Place 1 drop into both eyes daily as needed (dry eyes).    Yes [provider]     Vital Signs: BP (!) 130/55   Pulse 90   Temp 98.6 F (37 C) (Axillary)   Resp (!) 23   Ht 5\' 11"  (1.803 m)   Wt 141 lb 5 oz (64.1 kg)   SpO2 100%   BMI 19.71 kg/m   Physical Exam  NAD, not interactive, eyes open Abdomen: Large midline wound. Drain in place. Insertion site intact.   Bloody, purulent-appearing output. 20 mL documented in last 24 hrs   Imaging: CT HEAD WO CONTRAST  Result Date:  12/18/2020 CLINICAL DATA:  Altered mental status EXAM: CT HEAD WITHOUT CONTRAST TECHNIQUE: Contiguous axial images were obtained from the base of the skull through the vertex without intravenous contrast. COMPARISON:  12/16/2019 FINDINGS: Brain: Encephalomalacia changes are noted in the distribution of left middle cerebral artery consistent with prior infarct. These are stable in appearance from the prior exam. Some gyral calcifications are noted. Atrophic changes and chronic white matter ischemic changes are noted as well. Left basal ganglia infarcts are seen stable in appearance from the prior exam. No findings to suggest acute hemorrhage or acute infarction are seen. Vascular: No hyperdense vessel or unexpected calcification. Skull: Normal. Negative for fracture or focal lesion. Sinuses/Orbits: No acute finding. Other: None. IMPRESSION: Chronic atrophic and ischemic changes. No acute abnormality is noted. Electronically Signed   By: Inez Catalina M.D.   On: 12/18/2020 08:47    Labs:  CBC: Recent Labs    12/10/20 0500 12/15/20 0605 12/15/20 0813 12/20/20 0523  WBC 25.4* SPECIMEN CONTAMINATED, UNABLE TO PERFORM TEST(S). 35.4* 25.1*  HGB 7.1* SPECIMEN CONTAMINATED, UNABLE TO PERFORM TEST(S). 7.2* 5.6*  HCT 22.9* SPECIMEN CONTAMINATED, UNABLE TO PERFORM TEST(S). 23.8* 18.4*  PLT 327 SPECIMEN CONTAMINATED, UNABLE TO PERFORM TEST(S). 539* 395    COAGS: No results for input(s): INR, APTT in the last 8760 hours.  BMP: Recent Labs    12/27/19 0548 12/31/19 0221 Dec 04, 2020 1830 12/17/20  5638 12/17/20 1029 12/17/20 1826 12/18/20 0437 12/19/20 0503 12/20/20 0523  NA 142 138   < > 133*  --   --  134* 136 133*  K 4.3 4.4   < > 4.7   < > 4.5 4.3 3.3* 3.1*  CL 108 107   < > 103  --   --  106 103 103  CO2 25 21*   < > 19*  --   --  19* 20* 19*  GLUCOSE 98 113*   < > 119*  --   --  206* 179* 184*  BUN 22 34*   < > 91*  --   --  88* 94* 106*  CALCIUM 9.0 9.0   < > 8.2*  --   --  7.8* 7.9* 7.4*   CREATININE 1.02 1.21   < > 1.51*  --   --  1.45* 1.60* 1.74*  GFRNONAA >60 59*   < > 48*  --   --  51* 45* 41*  GFRAA >60 >60  --   --   --   --   --   --   --    < > = values in this interval not displayed.    LIVER FUNCTION TESTS: Recent Labs    12/11/20 0329 12/14/20 0226 12/15/20 0813 12/18/20 0437  BILITOT 0.9 0.8 1.1 1.1  AST 22 31 35 27  ALT 19 27 33 29  ALKPHOS 79 107 116 95  PROT 5.3* 6.0* 6.5 5.8*  ALBUMIN 1.3* 1.5* 1.7* 1.5*    Assessment and Plan: SBO s/p ex lap with SBR, placement of a gastrostomy tube12/7 Takeback 12/12 by Dr. Zenia Resides for necrotic SB with resection of additional 60-70cm of SB inclusive of prior anastomosis in discontinuity with open abdomen.  S/P ex lap, ileostomy, mucous fistula, closure 12/15 Abscess by CT 12/23 s/p perc drain on 12/24 by Dr. Vernard Gambles Resting comfortably on trach support.  Appears ill, frail.  Tmax 100.5 yesterday afternoon. Drain in place without issues. Ongoing significant purulent-appearing output.  No changes to care plan today-- per family, continue with current level of support. Continue current drain management.  Electronically Signed: Docia Barrier, PA 12/20/2020, 11:47 AM   I spent a total of 15 Minutes at the the patient's bedside AND on the patient's hospital floor or unit, greater than 50% of which was counseling/coordinating care for intra-abdominal fluid collection.

## 2020-12-20 NOTE — Progress Notes (Addendum)
NAME:  Ronald Wolf, MRN:  [id number], DOB:  10-16-1946, LOS: 59 ADMISSION DATE:  12/09/2020, CONSULTATION DATE:  12/7 REFERRING MD:  Cyndia Skeeters, CHIEF COMPLAINT:  Abdominal pain   Brief History:  Mr.Ramcharan is a 75 yo M w/ PMH of CVA, Seizures, Dementia, Bladder ca s/p urostomy, tobacco use, right glottic carcinoma s/p excision presenting with sbo. Found to have ischemic bowel requiring ex-lap 12/7.  Course complicated by septic shock, acute respiratory failure and AKI return to the OR 12/12 for necrotic small bowel resection of additional 70 cm inclusive of prior anastomosis 12/15 abdominal closure with ileostomy He has had a prolonged ICU stay with ventilator dependence, had a tracheostomy on 12/29  Past Medical History:  CVA, Dementia, Bladder CA s/p nephrostomy, tobacco use, right glottic carcinoma s/p excision, seizures.  Significant Hospital Events:  12/11/2020 Admission 12/08/2020 OR 11/19/2020 Admit to ICU 12/10 Brief SVT with associated hypertension. Self limited Given fentanyl, metop and hydral for HTN 12/11 Good urine output with Lasix , failed extubation, reintubated within 2 hours due to inability to clear secretions 12/12 return OR for enteric leak-- found to have bowel necrosis. Left in discontinuity  12/13 self extubated, reintubated. On pressors. Family meeting with PCCM and CCS to discuss possible next steps.  12/15-was back in OR, abdominal closure, RUQ mucus fistula and L ileostomy  12/19: No significant overnight events, high ileostomy output 12/21: scheduled for family meeting. Worse hypernatremia, continued high ileostomy output.  Attempted PSV/CPAP wean, pulled volumes of high 100s -low/mid 200s with tachypnea. Placed back of full support.  12/22 ongoing high output GI/ ileostomy, TPN started; weaned 12/5 all day; off fentanyl gtt, remains on precedex  12/24 IR drain , 1 unit PRBC transfusion for hemoglobin 6.5 12/26 added vancomycin for GPC in blood cultures and RIC suggesting  MRSA 12/29 tracheostomy 12/31 Palliative discussion > family understanding that his situation is critical and he has not made much improvement  1/6 ongoing palliative conversations. No progress from pt standpoint. R sided facial droop and not following commands, flaccid RUE tone -- STAT CT H without acute abnormality, large old L MCA stroke  1/7 low-grade fever, failed to wean, sister updated  Consults:  General Surgery PCCM Palliative Care  Procedures:  11/13/2020 Ex-lap, G-tube placement 12/7 PICC >> ETT 12/7 >> 12/11, >> 12/15- back to OR for abdominal closure, exploratory laparotomy  12/24 CT-guided right pelvic abscess drain 12/29 trach  Significant Diagnostic Tests:  CT abd/pelvis 12/2 > High-grade distal small bowel obstruction, possibly due to adhesion. Mild ascites and diffuse mesenteric edema. No evidence of focal inflammatory process or abscess. Small bilateral pleural effusions and mild right lower lobe Atelectasis. CT abdomen/pelvis 12/23 Interval development of a large loculated collection in the low anterior abdomen and pelvis measuring approximately 6.1 x 11.3 x12.1 cm in size subjacent to the operative site. Few foci of internal gas are present. , no frank spillage of enteric contrast   CT H 1/6 > no acute intracranial abnormality. Chronic L MCA changes from prior L MCA infarct. Atrophic and chronic white matter ischemic changes.  Stable L basal ganglia infarcts   Micro Data:  11/17/2020 COVID/Flu negative 12/7 BA: Few GNR rare GPC -- predominantly PMN  12/7 BCx> Neg 12/11 respiratory -no organism in tracheal aspirate 12/24 BC >> RIC showing both staph epidermis 1/4 12/24 abscess >> E. Faecalis, B fragilis (B lactamase) 12/27 trach aspirate>> normal flora  Antimicrobials:  Fluc 12/8>> 12/10 Zosyn 12/8> 12/12 cefotetain 12/15>> off Zosyn 12/23 >>  2/30 eraxis 12/24 >> 1/2 Vancomycin 12/26  Unasyn 12/31 >  Interim History / Subjective:   Remains chronic  critically ill, low-grade febrile 100.2 Remains vent dependent Adequate urine output ~+15 L, minimal output via abdominal drain , ileostomy putting out 1400 cc  Objective   Blood pressure (!) 116/58, pulse 98, temperature 98.6 F (37 C), temperature source Axillary, resp. rate (!) 31, height 5' 11"  (1.803 m), weight 64.1 kg, SpO2 100 %.    Vent Mode: PRVC FiO2 (%):  [40 %] 40 % Set Rate:  [14 bmp] 14 bmp Vt Set:  [500 mL-600 mL] 600 mL PEEP:  [5 cmH20] 5 cmH20 Plateau Pressure:  [19 cmH20-20 cmH20] 20 cmH20   Intake/Output Summary (Last 24 hours) at 12/20/2020 1024 Last data filed at 12/20/2020 0923 Gross per 24 hour  Intake 4957.3 ml  Output 3110 ml  Net 1847.3 ml   Filed Weights   12/17/20 0500 12/18/20 0411 12/19/20 0431  Weight: 64.1 kg 64.1 kg 64.1 kg    Examination: General: Frail, critically ill appearing older adult M trach/vent NAD  HENT: [place] temporal muscle wasting. Anicteric sclera. Trach secure.  PULM: Bilateral ventilated breath sounds, no accessory muscle use CV: rrr s1s2 cap refill < 3 sec  GI: R sided mucus fistula. Midline abdominal incision with granulomatous tissue, no exudate or purulence. LUQ Gtube. LLQ ileostomy with liquid output GU: RUQ urostomy with chunks of tissue in catheter, edematous penis  MSK: BUE pitting edema. No obvious joint deformity. Muscle atrophy BUE BLE  Neuro: Awake, does not follow commands, purposeful Skin: c/d/w    Chest x-ray 1/1 shows small bilateral effusions with bilateral lower lobe atelectasis. Labs show hypokalemia, drop in hemoglobin to 5.6, decrease leukocytosis from 35-25, mild hyponatremia Unasyn will continue till 110 right  Resolved Hospital Problem list     Assessment & Plan:   Acute encephalopathy, multifactorial Prior L MCA CVA -New R sided facial droop/ flaccid RUE tone 1/6, CT head negative  P Supportive care Not on continuous sedation.  With drop in hemoglobin, hold off on Plavix   Sepsis due to  enterococcus faecalis  Intra-abdominal abscess Fevers 1/7 P Unasyn for 10 day course (stop date in place)  Panculture again for bacteremia , PICC line present since 12/7   Acute hypoxemic respiratory failure requiring mechanical ventilation S/p trach VDRF  -failing SBTs 1/6, 1/7 due to accessory muscle use -- is profoundly deconditioned  Bilateral pleural effusions due to malnutrition  P Cont vent wean efforts --limited by general weakness and abdominal sepsis CPT, VAP, pulm hygiene  Chest x-ray tomorrow   AKI -interval increase in Cr from 1.45 to 1.6 (1/7) -suspect this is prerenal -- high ostomy output,insensible losses, third spacing from low albumin Hypokalemia Hyponatremia P trend UOP, renal indices minimize nephrotoxins as able MAP goal > 65  Replete potassium IV and in TPN. Limit free water to every 8  SBO Ischemic bowel perf s/p ex-lap x 2 for resection, mucus fistula ileostomy, G tube placement  IAA, now s/p drain  Severe protein calorie malnutrition High ileostomy output, enteral malabsorption  P CCS following EN; unfortunately not absorbing -- doubt this will improve  Imodium, opium, EN morphine in effort to slow ostomy output TPN  General surgery follow-up is appreciated -I doubt he is a candidate for any more surgical procedures  Hypertension, uncontrolled Tachybradycardia syndrome EKG with fascicular block BP more stable, and no brady >24 hrs  P Avoid beta-blocker Decrease hydralazine 50 every 8 ICU monitoring  Atrial fibrillation with RVR> resolved Not on anticoagulation because of recent surgery  Seizure  No more seizures Continue Keppra  Bladder Cancer Routine urostomy care  Anemia, no obvious source of ongoing blood loss, hemoglobin dropped from 7.2-5.6 No signs of bleeding Monitor H&H PRN Transfuse 1 unit PRBC  Goals of Care Ongoing GOC convos (many interdisciplinary meetings, now being followed by palliative care) Poor prognosis  has been communicated to family by several providers, I did this again 1/7 to Ermalinda Memos and brother at bedside.   Family seems to be having hard time accepting poor prognosis, is indicating "1 more week" but this seems to be recurring pattern. Not sure that family has clear idea of what they are hoping will occur with more time I have asked surgeons to communicate with family he is probably not a candidate for any more procedures and we should be discussing transfer to the next venue?  LTAC versus chronic vent facility.  Best practice (evaluated daily)  Diet: TPN, EN  Pain/Anxiety/Delirium protocol (if indicated): Fentanyl as needed VAP protocol (if indicated): yes DVT prophylaxis: Subcu heparin GI prophylaxis: Pantoprazole  Glucose control: SSI  Mobility: As tolerated Disposition: ICU Family Update: Family at bedside 1/7  Goals of Care:  Last date of multidisciplinary goals of care discussion: 54/4 Family and staff present: Patient's daughter Ms. Alveta Heimlich Summary of discussion:  1/5- Palliative care met with family - -desired for ongoing aggressive medical care as offered, limited code blue Code Status: limited code blue no shock, no CPR  Labs   CBC: Recent Labs  Lab 12/15/20 0605 12/15/20 0813 12/20/20 0523  WBC SPECIMEN CONTAMINATED, UNABLE TO PERFORM TEST(S). 35.4* 25.1*  NEUTROABS SPECIMEN CONTAMINATED, UNABLE TO PERFORM TEST(S). 29.1*  --   HGB SPECIMEN CONTAMINATED, UNABLE TO PERFORM TEST(S). 7.2* 5.6*  HCT SPECIMEN CONTAMINATED, UNABLE TO PERFORM TEST(S). 23.8* 18.4*  MCV SPECIMEN CONTAMINATED, UNABLE TO PERFORM TEST(S). 99.2 102.8*  PLT SPECIMEN CONTAMINATED, UNABLE TO PERFORM TEST(S). 539* 960    Basic Metabolic Panel: Recent Labs  Lab 12/14/20 0226 12/15/20 0813 12/16/20 0427 12/17/20 0443 12/17/20 0742 12/17/20 1029 12/17/20 1437 12/17/20 1826 12/18/20 0437 12/19/20 0503 12/20/20 0523  NA 137 138   < > 130* 133*  --   --   --  134* 136 133*  K 4.5 4.7   <  > 6.6* 4.7   < > 4.5 4.5 4.3 3.3* 3.1*  CL 107 107   < > 103 103  --   --   --  106 103 103  CO2 21* 22   < > 19* 19*  --   --   --  19* 20* 19*  GLUCOSE 161* 199*   < > 263* 119*  --   --   --  206* 179* 184*  BUN 44* 47*   < > 85* 91*  --   --   --  88* 94* 106*  CREATININE 0.93 0.92   < > 1.43* 1.51*  --   --   --  1.45* 1.60* 1.74*  CALCIUM 8.0* 8.4*   < > 7.9* 8.2*  --   --   --  7.8* 7.9* 7.4*  MG 2.1 2.3  --   --   --   --   --   --  2.5* 2.7* 2.8*  PHOS 3.7 3.4  --   --   --   --   --   --  5.8*  --  4.7*   < > =  values in this interval not displayed.   GFR: Estimated Creatinine Clearance: 33.8 mL/min (A) (by C-G formula based on SCr of 1.74 mg/dL (H)). Recent Labs  Lab 12/15/20 0605 12/15/20 0813 12/20/20 0523  WBC SPECIMEN CONTAMINATED, UNABLE TO PERFORM TEST(S). 35.4* 25.1*    Liver Function Tests: Recent Labs  Lab 12/14/20 0226 12/15/20 0813 12/18/20 0437  AST 31 35 27  ALT 27 33 29  ALKPHOS 107 116 95  BILITOT 0.8 1.1 1.1  PROT 6.0* 6.5 5.8*  ALBUMIN 1.5* 1.7* 1.5*   No results for input(s): LIPASE, AMYLASE in the last 168 hours. No results for input(s): AMMONIA in the last 168 hours.  ABG    Component Value Date/Time   PHART 7.361 11/25/2020 2113   PCO2ART 47.6 11/14/2020 2113   PO2ART 41 (L) 12/11/2020 2113   HCO3 9.4 (L) 12/08/2020 0822   TCO2 28 11/22/2020 2113   ACIDBASEDEF 19.3 (H) 12/08/2020 0822   O2SAT 78.5 12/08/2020 0822     Coagulation Profile: No results for input(s): INR, PROTIME in the last 168 hours.  Cardiac Enzymes: No results for input(s): CKTOTAL, CKMB, CKMBINDEX, TROPONINI in the last 168 hours.  HbA1C: Hgb A1c MFr Bld  Date/Time Value Ref Range Status  11/19/2020 10:37 AM 5.0 4.8 - 5.6 % Final    Comment:    (NOTE) Pre diabetes:          5.7%-6.4%  Diabetes:              >6.4%  Glycemic control for   <7.0% adults with diabetes   12/17/2019 08:19 AM 4.5 (L) 4.8 - 5.6 % Final    Comment:    (NOTE) Pre diabetes:           5.7%-6.4% Diabetes:              >6.4% Glycemic control for   <7.0% adults with diabetes     CBG: Recent Labs  Lab 12/19/20 1537 12/19/20 1949 12/19/20 2341 12/20/20 0339 12/20/20 0802  GLUCAP 123* 142* 140* 147* 156*   CRITICAL CARE Performed by: Leanna Sato. Theo Reither   Total critical care time: 32 minutes  Critical care time was exclusive of separately billable procedures and treating other patients.  Critical care was necessary to treat or prevent imminent or life-threatening deterioration.  Critical care was time spent personally by me on the following activities: development of treatment plan with patient and/or surrogate as well as nursing, discussions with consultants, evaluation of patient's response to treatment, examination of patient, obtaining history from patient or surrogate, ordering and performing treatments and interventions, ordering and review of laboratory studies, ordering and review of radiographic studies, pulse oximetry and re-evaluation of patient's condition.  Kara Mead MD. Shade Flood. [facility] See Amion for pager  If no response to pager , please call [phone]  After 7:00 pm call Elink  [phone]    12/20/2020, 10:24 AM

## 2020-12-20 NOTE — Progress Notes (Signed)
CRITICAL VALUE ALERT  Critical Value:  Hemoglobin 5.6  Date & Time Notied:  12/20/2020 0615   Provider Notified: CCM   Orders Received/Actions taken: Currently awaiting new orders/ See new orders to acknowledge

## 2020-12-20 NOTE — Progress Notes (Signed)
PHARMACY - TOTAL PARENTERAL NUTRITION CONSULT NOTE  Indication: Small bowel obstruction, prolonged ileus  Patient Measurements: Height: 5\' 11"  (180.3 cm) Weight: 61.2 kg (134 lb 14.7 oz) IBW/kg (Calculated) : 75.3 TPN AdjBW (KG): 60.8 Body mass index is 18.82 kg/m. Usual Weight: 137 lbs  Assessment: 74 YOM presented 12/05/2020 with nausea, vomiting, abdominal pain after leaving AMA at [facility] with known SBO. Patient has been on bowel rest with NG tube decompression. Patient continued to have persistent SBO on imaging with no evidence of improvement, now s/p ex-lap with LoA, SBR and placement of Gtube on 12/09/2020. Pharmacy consulted for TPN management - patient received TPN 11/19/20 through 11/29/20.  Reconsulted to resume TPN on 12/2. TF currently at 30 mL/hr given high ostomy output and concern for tube feeds not absorbing. Patient on loperamide, lomotil, tincture of opium and fiber without improvement in output.   Glucose / Insulin: no hx DM - CBGs <180  - utilized 13 units mSSI/24hrs Electrolytes: Na 136>133 (on FW 224ml q4h), K down to 3.1 (s/p K runs x 2, removed in TPN), CO2 down to 19, phos 5.8>4.7 (reduced in TPN), mag high 2.8; others WNL Renal: Scr up to 1.74, BUN up to 106 LFTs / TGs: LFTs / Tbili / TG wnl Prealbumin / albumin: Prealbumin 7.7 > 17.9, albumin 1.5 Intake / Output; MIVF: UOP 0.9 ml/kg/hr, ileostomy output/24h: 1522mL (on fiber, opium tincture, loperamide and unlikely to improve per Surgery), drain O/P 10ml, net +15.6L this admit GI Imaging: 12/11 DG Abd - stomach gas-filled, without distention, no obvious free air 12/23 CT abd pelvis - large loculated collection in low abd/pelvis, few foci of internal gas present, new RUQ ileostomy, thickened distal SB/colon, subtotal atelectatic collapse of LLL, partial collapse of RLL, small R pleural effusion, body wall edema Surgeries / Procedures: 12/12: re-exploration and washout with new copious drainage from incision concerning  for enteric leak vs. enterotomy >> now s/p, bowel necrosis resected 12/15: ex-lap with ileostomy with fistula creation, abdominal closure 12/24 Pelvic abscess drain 12/29 - trach placed  Central access: PICC placed 12/11/2020 TPN start date: 11/19/20>>11/29/20; restart 12/03/20 >>  Nutritional Goals (updated per RD rec 12/30): 2300-2700 kCal, 120-135g protein, >2L fluid per day  Goal TPN rate of 100 cc/hr will provide 120g AA, 432g CHO, and 36g lipids (15% of total kCal due to [employer]) for a total of 2308 kcal, meeting 100% of needs  Current Nutrition:  TPN Osmolite 1.5 at 30 ml/hr (goal rate 65 ml/hr) - not advancing rate d/t ileostomy output ProSource TF 45 ml TID (each 11g AA and 40 kcal)  Plan:  Continue TPN at goal rate 100 ml/hr at 1800. TPN will provide 120g protein, 432g CHO, 36g SMOF lipids (~15% of total kcal due to national shortage), and 2308 kcal - meeting 100% of estimated needs. 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 Family wants to pursue aggressive care for now but [place] discussions are ongoing.   [name], PharmD, BCPS Please check AMION for all [place] contact numbers Clinical Pharmacist 12/20/2020 7:44 AM

## 2020-12-20 NOTE — Progress Notes (Signed)
[place] Progress Note Patient Name: Ronald Wolf DOB: [birth year] MRN: [id number]   Date of Service  12/20/2020  HPI/Events of Note  Anemia - Hgb = 5.6.  eICU Interventions  Will transfuse 2 units PRBC now.      Intervention Category Major Interventions: Other:  Lysle Dingwall 12/20/2020, 6:25 AM

## 2020-12-21 ENCOUNTER — Inpatient Hospital Stay (HOSPITAL_COMMUNITY): Payer: Medicare PPO

## 2020-12-21 DIAGNOSIS — J9601 Acute respiratory failure with hypoxia: Secondary | ICD-10-CM | POA: Diagnosis not present

## 2020-12-21 DIAGNOSIS — K56609 Unspecified intestinal obstruction, unspecified as to partial versus complete obstruction: Secondary | ICD-10-CM | POA: Diagnosis not present

## 2020-12-21 DIAGNOSIS — E43 Unspecified severe protein-calorie malnutrition: Secondary | ICD-10-CM | POA: Diagnosis not present

## 2020-12-21 DIAGNOSIS — Z515 Encounter for palliative care: Secondary | ICD-10-CM | POA: Diagnosis not present

## 2020-12-21 DIAGNOSIS — N179 Acute kidney failure, unspecified: Secondary | ICD-10-CM | POA: Diagnosis not present

## 2020-12-21 DIAGNOSIS — Z8551 Personal history of malignant neoplasm of bladder: Secondary | ICD-10-CM | POA: Diagnosis not present

## 2020-12-21 LAB — CBC WITH DIFFERENTIAL/PLATELET
Abs Immature Granulocytes: 1.53 K/uL — ABNORMAL HIGH (ref 0.00–0.07)
Basophils Absolute: 0 K/uL (ref 0.0–0.1)
Basophils Relative: 0 %
Eosinophils Absolute: 0 K/uL (ref 0.0–0.5)
Eosinophils Relative: 0 %
HCT: 24.3 % — ABNORMAL LOW (ref 39.0–52.0)
Hemoglobin: 7.7 g/dL — ABNORMAL LOW (ref 13.0–17.0)
Immature Granulocytes: 6 %
Lymphocytes Relative: 5 %
Lymphs Abs: 1.3 K/uL (ref 0.7–4.0)
MCH: 30.1 pg (ref 26.0–34.0)
MCHC: 31.7 g/dL (ref 30.0–36.0)
MCV: 94.9 fL (ref 80.0–100.0)
Monocytes Absolute: 2.5 K/uL — ABNORMAL HIGH (ref 0.1–1.0)
Monocytes Relative: 10 %
Neutro Abs: 19 K/uL — ABNORMAL HIGH (ref 1.7–7.7)
Neutrophils Relative %: 79 %
Platelets: 354 K/uL (ref 150–400)
RBC: 2.56 MIL/uL — ABNORMAL LOW (ref 4.22–5.81)
RDW: 21.2 % — ABNORMAL HIGH (ref 11.5–15.5)
WBC: 24.5 K/uL — ABNORMAL HIGH (ref 4.0–10.5)
nRBC: 1.8 % — ABNORMAL HIGH (ref 0.0–0.2)

## 2020-12-21 LAB — COMPREHENSIVE METABOLIC PANEL WITH GFR
ALT: 24 U/L (ref 0–44)
AST: 27 U/L (ref 15–41)
Albumin: 1.4 g/dL — ABNORMAL LOW (ref 3.5–5.0)
Alkaline Phosphatase: 74 U/L (ref 38–126)
Anion gap: 15 (ref 5–15)
BUN: 121 mg/dL — ABNORMAL HIGH (ref 8–23)
CO2: 18 mmol/L — ABNORMAL LOW (ref 22–32)
Calcium: 7.7 mg/dL — ABNORMAL LOW (ref 8.9–10.3)
Chloride: 102 mmol/L (ref 98–111)
Creatinine, Ser: 2.15 mg/dL — ABNORMAL HIGH (ref 0.61–1.24)
GFR, Estimated: 32 mL/min — ABNORMAL LOW
Glucose, Bld: 170 mg/dL — ABNORMAL HIGH (ref 70–99)
Potassium: 3.1 mmol/L — ABNORMAL LOW (ref 3.5–5.1)
Sodium: 135 mmol/L (ref 135–145)
Total Bilirubin: 1.4 mg/dL — ABNORMAL HIGH (ref 0.3–1.2)
Total Protein: 5.8 g/dL — ABNORMAL LOW (ref 6.5–8.1)

## 2020-12-21 LAB — GLUCOSE, CAPILLARY
Glucose-Capillary: 117 mg/dL — ABNORMAL HIGH (ref 70–99)
Glucose-Capillary: 119 mg/dL — ABNORMAL HIGH (ref 70–99)
Glucose-Capillary: 142 mg/dL — ABNORMAL HIGH (ref 70–99)
Glucose-Capillary: 147 mg/dL — ABNORMAL HIGH (ref 70–99)
Glucose-Capillary: 148 mg/dL — ABNORMAL HIGH (ref 70–99)
Glucose-Capillary: 153 mg/dL — ABNORMAL HIGH (ref 70–99)

## 2020-12-21 LAB — URINE CULTURE: Culture: NO GROWTH

## 2020-12-21 LAB — CBC
HCT: 19.1 % — ABNORMAL LOW (ref 39.0–52.0)
Hemoglobin: 6.2 g/dL — CL (ref 13.0–17.0)
MCH: 30.4 pg (ref 26.0–34.0)
MCHC: 32.5 g/dL (ref 30.0–36.0)
MCV: 93.6 fL (ref 80.0–100.0)
Platelets: 281 10*3/uL (ref 150–400)
RBC: 2.04 MIL/uL — ABNORMAL LOW (ref 4.22–5.81)
RDW: 21.1 % — ABNORMAL HIGH (ref 11.5–15.5)
WBC: 20 10*3/uL — ABNORMAL HIGH (ref 4.0–10.5)
nRBC: 1.7 % — ABNORMAL HIGH (ref 0.0–0.2)

## 2020-12-21 LAB — TYPE AND SCREEN
ABO/RH(D): B POS
Antibody Screen: NEGATIVE
Unit division: 0
Unit division: 0

## 2020-12-21 LAB — BPAM RBC
Blood Product Expiration Date: 202201232359
Blood Product Expiration Date: 202201262359
ISSUE DATE / TIME: 202201080802
ISSUE DATE / TIME: 202201080802
Unit Type and Rh: 7300
Unit Type and Rh: 7300

## 2020-12-21 LAB — MAGNESIUM: Magnesium: 2.6 mg/dL — ABNORMAL HIGH (ref 1.7–2.4)

## 2020-12-21 LAB — PHOSPHORUS: Phosphorus: 5.1 mg/dL — ABNORMAL HIGH (ref 2.5–4.6)

## 2020-12-21 MED ORDER — BARIUM SULFATE 2.1 % PO SUSP
ORAL | Status: AC
  Administered 2020-12-21: 900 mL via ORAL
  Filled 2020-12-21: qty 2

## 2020-12-21 MED ORDER — BARIUM SULFATE 2.1 % PO SUSP
900.0000 mL | ORAL | Status: AC
  Administered 2020-12-21: 900 mL via ORAL

## 2020-12-21 MED ORDER — PIPERACILLIN-TAZOBACTAM 3.375 G IVPB
3.3750 g | Freq: Three times a day (TID) | INTRAVENOUS | Status: AC
  Administered 2020-12-21 – 2020-12-30 (×29): 3.375 g via INTRAVENOUS
  Filled 2020-12-21 (×29): qty 50

## 2020-12-21 MED ORDER — TRACE MINERALS CU-MN-SE-ZN 300-55-60-3000 MCG/ML IV SOLN
INTRAVENOUS | Status: AC
  Filled 2020-12-21: qty 800

## 2020-12-21 MED ORDER — POTASSIUM CHLORIDE 10 MEQ/50ML IV SOLN
10.0000 meq | INTRAVENOUS | Status: AC
  Administered 2020-12-21 (×4): 10 meq via INTRAVENOUS
  Filled 2020-12-21 (×4): qty 50

## 2020-12-21 MED ORDER — LEVETIRACETAM 100 MG/ML PO SOLN
500.0000 mg | Freq: Two times a day (BID) | ORAL | Status: DC
  Administered 2020-12-21 – 2020-12-28 (×14): 500 mg
  Filled 2020-12-21 (×14): qty 5

## 2020-12-21 MED ORDER — VANCOMYCIN HCL 1250 MG/250ML IV SOLN
1250.0000 mg | INTRAVENOUS | Status: DC
  Administered 2020-12-21: 1250 mg via INTRAVENOUS
  Filled 2020-12-21 (×2): qty 250

## 2020-12-21 NOTE — Progress Notes (Signed)
Patient transported to CT & back on ventilator with no problems 

## 2020-12-21 NOTE — Progress Notes (Signed)
CRITICAL VALUE ALERT  Critical Value:  Hemoglobin 6.2  Date & Time Notied:  12/21/2020 0650   Provider Notified: CCM   Orders Received/Actions taken: Notified CCM/ pass on to day shift RN

## 2020-12-21 NOTE — Progress Notes (Signed)
NAME:  Ronald Wolf, MRN:  [id number], DOB:  [birth year], LOS: 48 ADMISSION DATE:  12/08/2020, CONSULTATION DATE:  12/7 REFERRING MD:  Cyndia Skeeters, CHIEF COMPLAINT:  Abdominal pain   Brief History:  Mr.Ebbs is a 75 yo M w/ PMH of CVA, Seizures, Dementia, Bladder ca s/p urostomy, tobacco use, right glottic carcinoma s/p excision presenting with sbo. Found to have ischemic bowel requiring ex-lap 12/7.  Course complicated by septic shock, acute respiratory failure and AKI return to the OR 12/12 for necrotic small bowel resection of additional 70 cm inclusive of prior anastomosis 12/15 abdominal closure with ileostomy He has had a prolonged ICU stay with ventilator dependence, had a tracheostomy on 12/29  Past Medical History:  CVA, Dementia, Bladder CA s/p nephrostomy, tobacco use, right glottic carcinoma s/p excision, seizures.  Significant Hospital Events:  11/20/2020 Admission 11/19/2020 OR 12/04/2020 Admit to ICU 12/10 Brief SVT with associated hypertension. Self limited Given fentanyl, metop and hydral for HTN 12/11 Good urine output with Lasix , failed extubation, reintubated within 2 hours due to inability to clear secretions 12/12 return OR for enteric leak-- found to have bowel necrosis. Left in discontinuity  12/13 self extubated, reintubated. On pressors. Family meeting with PCCM and CCS to discuss possible next steps.  12/15-was back in OR, abdominal closure, RUQ mucus fistula and L ileostomy  12/19: No significant overnight events, high ileostomy output 12/21: scheduled for family meeting. Worse hypernatremia, continued high ileostomy output.  Attempted PSV/CPAP wean, pulled volumes of high 100s -low/mid 200s with tachypnea. Placed back of full support.  12/22 ongoing high output GI/ ileostomy, TPN started; weaned 12/5 all day; off fentanyl gtt, remains on precedex  12/24 IR drain , 1 unit PRBC transfusion for hemoglobin 6.5 12/26 added vancomycin for GPC in blood cultures and RIC suggesting  MRSA 12/29 tracheostomy 12/31 Palliative discussion > family understanding that his situation is critical and he has not made much improvement  1/6 ongoing palliative conversations. No progress from pt standpoint. R sided facial droop and not following commands, flaccid RUE tone -- STAT CT H without acute abnormality, large old L MCA stroke  1/7 low-grade fever, failed to wean, sister updated 1/8 low-grade fever, transfused 2 units PRBC for hemoglobin dropped to 5.3, blood clots suctioned out from trachea  Consults:  General Surgery PCCM Palliative Care  Procedures:  12/12/2020 Ex-lap, G-tube placement 12/7 PICC >> ETT 12/7 >> 12/11, >> 12/15- back to OR for abdominal closure, exploratory laparotomy  12/24 CT-guided right pelvic abscess drain 12/29 trach  Significant Diagnostic Tests:  CT abd/pelvis 12/2 > High-grade distal small bowel obstruction, possibly due to adhesion. Mild ascites and diffuse mesenteric edema. No evidence of focal inflammatory process or abscess. Small bilateral pleural effusions and mild right lower lobe Atelectasis. CT abdomen/pelvis 12/23 Interval development of a large loculated collection in the low anterior abdomen and pelvis measuring approximately 6.1 x 11.3 x12.1 cm in size subjacent to the operative site. Few foci of internal gas are present. , no frank spillage of enteric contrast   CT H 1/6 > no acute intracranial abnormality. Chronic L MCA changes from prior L MCA infarct. Atrophic and chronic white matter ischemic changes.  Stable L basal ganglia infarcts   Micro Data:  12/06/2020 COVID/Flu negative 12/7 BA: Few GNR rare GPC -- predominantly PMN  12/7 BCx> Neg 12/11 respiratory -no organism in tracheal aspirate 12/24 BC >> RIC showing both staph epidermis 1/4 12/24 abscess >> E. Faecalis, B fragilis (B lactamase) 12/27 trach  aspirate>> normal flora  Antimicrobials:  Fluc 12/8>> 12/10 Zosyn 12/8> 12/12 cefotetain 12/15>> off Zosyn 12/23 >>  12/30 eraxis 12/24 >> 1/2 Vancomycin 12/26 >>12/31 Unasyn 12/31 >1/9 zosyn 1/9>> vanc 1/9 >>  Interim History / Subjective:   Remains chronic critically ill, on vent, trach Low-grade febrile Blood clots suctioned out from trach yesterday Transfused 2 units PRBC yesterday, hemoglobin again reported as 6.2 this morning  Objective   Blood pressure (!) 136/55, pulse 91, temperature 99.2 F (37.3 C), temperature source Axillary, resp. rate (!) 28, height _0  (1.803 m), weight 64.1 kg, SpO2 100 %.    Vent Mode: PRVC FiO2 (%):  [40 %-60 %] 40 % Set Rate:  [14 bmp] 14 bmp Vt Set:  [600 mL] 600 mL PEEP:  [5 cmH20] 5 cmH20 Plateau Pressure:  [19 cmH20-33 cmH20] 24 cmH20   Intake/Output Summary (Last 24 hours) at 12/21/2020 1012 Last data filed at 12/21/2020 1007 Gross per 24 hour  Intake 4438.96 ml  Output 3155 ml  Net 1283.96 ml   Filed Weights   12/17/20 0500 12/18/20 0411 12/19/20 0431  Weight: 64.1 kg 64.1 kg 64.1 kg    Examination: General: Frail, critically ill appearing older adult M trach/vent NAD  HENT: [place] temporal muscle wasting. Anicteric sclera. Trach minimal bleeding PULM: No accessory muscle use, bilateral ventilated breath sounds, rhonchi on right CV: rrr s1s2 cap refill < 3 sec  GI: R sided mucus fistula. Midline abdominal incision with granulomatous tissue, no exudate or purulence. LUQ Gtube. LLQ ileostomy with liquid output IR drain with brown output GU: RUQ urostomy with chunks of tissue in catheter, edematous penis  MSK: BUE pitting edema. No obvious joint deformity. Muscle atrophy BUE BLE  Neuro: Awake, does not follow commands, purposeful Skin: c/d/w    Chest x-ray 1/9 shows persistent small bilateral effusions, improved aeration right lower lobe, new infiltrate right upper lobe Labs show hypokalemia, BUN and creatinine continue to rise, low albumin 1.4, leukocytosis, hemoglobin 6.2, on repeat 7.7  Resolved Hospital Problem list     Assessment & Plan:     Sepsis  Intra-abdominal abscess -enterococcus faecalis  Fevers since 1/7, concern for HAP -new right upper lobe infiltrate 1/9 P Broaden antibiotics to Zosyn/vancomycin Pancultured 1/8  again for bacteremia , PICC line present since 12/7 If cultures from 1/8 are negative, can discontinue vancomycin and continue Zosyn for duration determined by status of abdominal abscess Repeat CT abdomen today to assess   Acute hypoxemic respiratory failure requiring mechanical ventilation S/p trach VDRF  -failing SBTs 1/6, 1/7 due to accessory muscle use -- is profoundly deconditioned  Bilateral pleural effusions due to malnutrition  Trach bleeding?  Suction trauma P Cont vent wean efforts --limited by general weakness and abdominal sepsis CPT, VAP, pulm hygiene     AKI -interval increase in Cr from 1.45 to 2.1 -suspect this is prerenal -- high ostomy output,insensible losses, third spacing from low albumin Hypokalemia Hyponatremia P trend UOP, renal indices Avoid nephrotoxins  Adjust meds to renal function Replete potassium IV and in TPN. Limit free water   Acute encephalopathy, multifactorial Prior L MCA CVA -New R sided facial droop/ flaccid RUE tone 1/6, CT head negative P Supportive care Not on continuous sedation.  With drop in hemoglobin, hold off on Plavix   SBO Ischemic bowel perf s/p ex-lap x 2 for resection, mucus fistula ileostomy, G tube placement  IAA, now s/p drain  Severe protein calorie malnutrition High ileostomy output, enteral malabsorption  P CCS  following EN; unfortunately not absorbing -- doubt this will improve  Imodium, opium, EN morphine in effort to slow ostomy output TPN  General surgery to follow again tomorrow-I doubt he is a candidate for any more surgical procedures, will obtain CT abdomen to reassess abscess and duration of antibiotics  Hypertension, uncontrolled Tachybradycardia syndrome EKG with fascicular block BP more stable, and  no brady >24 hrs  P Avoid beta-blocker Decreased hydralazine 50 every 8   Atrial fibrillation with RVR> resolved Not on anticoagulation because of recent surgery  Seizure  No more seizures Continue Keppra, reduce dose to renal function  Bladder Cancer Routine urostomy care  Anemia, no obvious source of ongoing blood loss, hemoglobin dropped from 7.2-5.6 >> improved to 8.5 with 2 units PRBC 1/8 Repeat hemoglobin 1/9 again dropped to 6.2>> 7.7 on repeat No signs of bleeding , feel that low a.m. hemoglobin values likely related to false low?  Drawn from PICC line transfusing TNA Monitor H&H PRN Hold off further transfusions  Goals of Care Ongoing GOC convos (many interdisciplinary meetings, now being followed by palliative care) Poor prognosis has been communicated to family by several providers, I did this again 1/7 & 1/8 to Ermalinda Memos and brother at bedside.   Family seems to be having hard time accepting poor prognosis.   I have asked surgeons to communicate with family he is probably not a candidate for any more procedures and we should be discussing transfer to the next venue?  LTAC versus chronic vent facility.  Best practice (evaluated daily)  Diet: TPN, EN  Pain/Anxiety/Delirium protocol (if indicated): Fentanyl as needed VAP protocol (if indicated): yes DVT prophylaxis: Subcu heparin GI prophylaxis: Pantoprazole  Glucose control: SSI  Mobility: As tolerated Disposition: ICU Family Update: Family at bedside 1/7  Goals of Care:  Last date of multidisciplinary goals of care discussion: 29/4 Family and staff present: Patient's daughter Ms. Alveta Heimlich Summary of discussion:  1/5- Palliative care met with family - -desired for ongoing aggressive medical care as offered, limited code blue Code Status: limited code blue no shock, no CPR  Labs   CBC: Recent Labs  Lab 12/15/20 0605 12/15/20 0813 12/20/20 0523 12/20/20 1424 12/21/20 0559 12/21/20 0810  WBC SPECIMEN  CONTAMINATED, UNABLE TO PERFORM TEST(S). 35.4* 25.1*  --  20.0* 24.5*  NEUTROABS SPECIMEN CONTAMINATED, UNABLE TO PERFORM TEST(S). 29.1*  --   --   --  19.0*  HGB SPECIMEN CONTAMINATED, UNABLE TO PERFORM TEST(S). 7.2* 5.6* 8.5* 6.2* 7.7*  HCT SPECIMEN CONTAMINATED, UNABLE TO PERFORM TEST(S). 23.8* 18.4* 25.6* 19.1* 24.3*  MCV SPECIMEN CONTAMINATED, UNABLE TO PERFORM TEST(S). 99.2 102.8*  --  93.6 94.9  PLT SPECIMEN CONTAMINATED, UNABLE TO PERFORM TEST(S). 539* 395  --  281 329    Basic Metabolic Panel: Recent Labs  Lab 12/15/20 0813 12/16/20 0427 12/17/20 0742 12/17/20 1029 12/17/20 1826 12/18/20 0437 12/19/20 0503 12/20/20 0523 12/21/20 0559  NA 138   < > 133*  --   --  134* 136 133* 135  K 4.7   < > 4.7   < > 4.5 4.3 3.3* 3.1* 3.1*  CL 107   < > 103  --   --  106 103 103 102  CO2 22   < > 19*  --   --  19* 20* 19* 18*  GLUCOSE 199*   < > 119*  --   --  206* 179* 184* 170*  BUN 47*   < > 91*  --   --  88* 94* 106* 121*  CREATININE 0.92   < > 1.51*  --   --  1.45* 1.60* 1.74* 2.15*  CALCIUM 8.4*   < > 8.2*  --   --  7.8* 7.9* 7.4* 7.7*  MG 2.3  --   --   --   --  2.5* 2.7* 2.8* 2.6*  PHOS 3.4  --   --   --   --  5.8*  --  4.7* 5.1*   < > = values in this interval not displayed.   GFR: Estimated Creatinine Clearance: 27.3 mL/min (A) (by C-G formula based on SCr of 2.15 mg/dL (H)). Recent Labs  Lab 12/15/20 0813 12/20/20 0523 12/21/20 0559 12/21/20 0810  WBC 35.4* 25.1* 20.0* 24.5*    Liver Function Tests: Recent Labs  Lab 12/15/20 0813 12/18/20 0437 12/21/20 0559  AST 35 27 27  ALT 33 29 24  ALKPHOS 116 95 74  BILITOT 1.1 1.1 1.4*  PROT 6.5 5.8* 5.8*  ALBUMIN 1.7* 1.5* 1.4*   No results for input(s): LIPASE, AMYLASE in the last 168 hours. No results for input(s): AMMONIA in the last 168 hours.  ABG    Component Value Date/Time   PHART 7.361 12/08/2020 2113   PCO2ART 47.6 11/25/2020 2113   PO2ART 41 (L) 12/06/2020 2113   HCO3 9.4 (L) 12/08/2020 0822    TCO2 28 11/21/2020 2113   ACIDBASEDEF 19.3 (H) 12/08/2020 0822   O2SAT 78.5 12/08/2020 0822     CRITICAL CARE Performed by: Leanna Sato. Phelix Fudala   Total critical care time: 33 minutes  Critical care time was exclusive of separately billable procedures and treating other patients.  Critical care was necessary to treat or prevent imminent or life-threatening deterioration.  Critical care was time spent personally by me on the following activities: development of treatment plan with patient and/or surrogate as well as nursing, discussions with consultants, evaluation of patient's response to treatment, examination of patient, obtaining history from patient or surrogate, ordering and performing treatments and interventions, ordering and review of laboratory studies, ordering and review of radiographic studies, pulse oximetry and re-evaluation of patient's condition.  Kara Mead MD. Shade Flood. [facility] See Amion for pager  If no response to pager , please call [phone]  After 7:00 pm call Elink  [phone]    12/21/2020, 10:12 AM

## 2020-12-21 NOTE — Progress Notes (Signed)
Pharmacy Antibiotic Note  Ronald Wolf is a 75 y.o. male admitted on 12/02/2020 with pneumonia.  Pharmacy has been consulted for vancomycin/zosyn dosing. Noted AKI - SCr trended up to 2.15 today.  Plan to d/c vanc 1/10 if cultures result negative per Dr. Elsworth Soho.  Plan: Zosyn 3.375g IV q8h (4h infusion) Vancomycin 1250 mg IV Q 48 hrs. Goal AUC 400-550. Expected AUC: 500 SCr used: 2.15 Monitor clinical progress, c/s, renal function F/u de-escalation plan/LOT, vancomycin levels as indicated   Height: _0  (180.3 cm) Weight: 64.1 kg (141 lb 5 oz) IBW/kg (Calculated) : 75.3  Temp (24hrs), Avg:99 F (37.2 C), Min:98 F (36.7 C), Max:100.6 F (38.1 C)  Recent Labs  Lab 12/15/20 0605 12/15/20 0813 12/16/20 0427 12/17/20 0742 12/18/20 0437 12/19/20 0503 12/20/20 0523 12/21/20 0559 12/21/20 0810  WBC SPECIMEN CONTAMINATED, UNABLE TO PERFORM TEST(S). 35.4*  --   --   --   --  25.1* 20.0* 24.5*  CREATININE  --  0.92   < > 1.51* 1.45* 1.60* 1.74* 2.15*  --    < > = values in this interval not displayed.    Estimated Creatinine Clearance: 27.3 mL/min (A) (by C-G formula based on SCr of 2.15 mg/dL (H)).    Allergies  Allergen Reactions  . Iohexol Hives     Desc: pt broke out in hives years ago from iv contrast. he was given 40m benadryl po, 1 hr prior to ct and did fine today w/o complications.  JB     Antimicrobials this admission: Zosyn 12/8 >>12/12; 12/23 >>12/31; 1/9>> Fluconazole 12/8 >> 12/10 Eraxis 12/24 >> 1/1 Vancomycin 12/26>>12/31; 1/9>> Unasyn 12/31>>1/9  Dose adjustments this admission:   Microbiology results: 12/7 MRSA PCR - negative 12/2 UCx - canceled 12/1 COVID/Flu - negative 12/7 BAL - negative 12/7 BCx - negative 12/11 TA - negative 12/24 BCx - 1 of 4 bottles with MRSE on BCID 12/24 Pelvic abscess- Mod E faecalis (pan-S), abu B fragilis 12/27 TA: NF 1/8 BCx - 1/8 UC -  1/8 TA - rare GVR   HArturo Morton PharmD, BCPS Please check AMION  for all [place] numbers Clinical Pharmacist 12/21/2020 10:23 AM

## 2020-12-21 NOTE — Progress Notes (Signed)
PHARMACY - TOTAL PARENTERAL NUTRITION CONSULT NOTE  Indication: Small bowel obstruction, prolonged ileus  Patient Measurements: Height: 5\' 11"  (180.3 cm) Weight: 61.2 kg (134 lb 14.7 oz) IBW/kg (Calculated) : 75.3 TPN AdjBW (KG): 60.8 Body mass index is 18.82 kg/m. Usual Weight: 137 lbs  Assessment: Ronald Wolf presented 12/06/2020 with nausea, vomiting, abdominal pain after leaving AMA at [facility] with known SBO. Patient has been on bowel rest with NG tube decompression. Patient continued to have persistent SBO on imaging with no evidence of improvement, now s/p ex-lap with LoA, SBR and placement of Gtube on 12/01/2020. Pharmacy consulted for TPN management - patient received TPN 11/19/20 through 11/29/20.  Reconsulted to resume TPN on 12/2. TF currently at 30 mL/hr given high ostomy output and concern for tube feeds not absorbing. Patient on loperamide, lomotil, tincture of opium and fiber without improvement in output.   Glucose / Insulin: no hx DM - CBGs <180  - utilized 16 units mSSI/24hrs Electrolytes: Na 133>135 (FW adjusted to 293ml PT q8h), K 3.1 stable (s/p K runs x 4), CO2 down to 18, phos 4.7>5.1 (reduced in TPN yesterday), mag 2.8>2.6; others WNL Renal: Scr up to 2.15, BUN up to 121 LFTs / TGs: LFTs / TG wnl, Tbili up to 1.4 Prealbumin / albumin: Prealbumin 7.7 > 17.9, albumin 1.4 Intake / Output; MIVF: UOP 1 ml/kg/hr, ileostomy output/24h: 1241mL (on fiber, opium tincture, lomotil, loperamide and unlikely to improve per Surgery), drain O/P 69ml, net +16L GI Imaging: 12/11 DG Abd - stomach gas-filled, without distention, no obvious free air 12/23 CT abd pelvis - large loculated collection in low abd/pelvis, few foci of internal gas present, new RUQ ileostomy, thickened distal SB/colon, subtotal atelectatic collapse of LLL, partial collapse of RLL, small R pleural effusion, body wall edema Surgeries / Procedures: 12/12: re-exploration and washout with new copious drainage from incision  concerning for enteric leak vs. enterotomy >> now s/p, bowel necrosis resected 12/15: ex-lap with ileostomy with fistula creation, abdominal closure 12/24 Pelvic abscess drain 12/29 - trach placed  Central access: PICC placed 11/28/2020 TPN start date: 11/19/20>>11/29/20; restart 12/03/20 >>  Nutritional Goals (updated per RD rec 12/30): 2300-2700 kCal, 120-135g protein, >2L fluid per day  Goal TPN rate of 100 cc/hr will provide 120g AA, 432g CHO, and 36g lipids (15% of total kCal due to [employer]) for a total of 2308 kcal, meeting 100% of needs  Current Nutrition:  TPN Osmolite 1.5 at 30 ml/hr (goal rate 65 ml/hr) - not advancing rate d/t ileostomy output ProSource TF 45 ml TID (each 11g AA and 40 kcal)  Plan:  Continue TPN at goal rate 100 ml/hr at 1800. TPN will provide 120g protein, 432g CHO, 36g SMOF lipids (~15% of total kcal due to national shortage), and 2308 kcal - meeting 100% of estimated needs. Electrolytes in TPN: Continue Na 40 mEq/L, increase to K 68mEq/L, continue Ca 3 mEq/L, remove Mag/Phos for now, max acetate Give K runs x 4 Continue FW 237ml PT q8h per MD Add standard MVI and trace elements + folic acid to TPN Continue moderate SSI q4h and adjust as needed Monitor TPN labs Per surgery, patient is likely to be TPN-dependent. F/u long-term plans, GOC discussions   [name], PharmD, BCPS Please check AMION for all [place] contact numbers Clinical Pharmacist 12/21/2020 7:40 AM

## 2020-12-21 NOTE — Progress Notes (Signed)
Daily Progress Note   Patient Name: Ronald Wolf       Date: 12/21/2020 DOB: [birth year]  Age: 75 y.o. MRN#: [id number] Attending Physician: Rigoberto Noel, MD Primary Care Physician: Mosie Lukes, MD Admit Date: 12/03/2020  Reason for Consultation/Follow-up: To discuss complex medical decision making related to patient's goals of care  Patient has declined since I saw him to days ago.  He will not make eye contact with me or track me.  He does blink to threat.  Reviewed notes.  Patient had a significant amount of blood loss secondary to clots from his trachea.  He has been transfused two units.  Subjective:  Spoke with [name] on the phone today.  As always she is very pleasant.  I explained that when I saw her father on 1/6 he was able to track me around the room.  Today his eyes are in a fixed gaze.  We discussed the bleeding yesterday.  I sadly explained that I think Mr. Ronald Wolf is in the process of dying.  I anticipate he will die in the hospital on life support.  I apologized for my candor but stated that I felt it was important that she understood what I am seeing and thinking.   She was quiet for a moment and then asked if more visitors could come in to see her father.  I said Yes if we shift him to comfort measures - we can even leave him on the vent for several days in order for your family to see him.  [name] replied that "No, she did not want to do that - they will continue as they have with full support.   We kindly said goodbye to each other.    Assessment: Patient appears to be in the dying process.  I'm disappointed but believe that he has no chance of recovery.   Patient Profile/HPI:   75 yo male with past medical history significant of bladder cancer s/p urostomy, seizure  disorder, right glottic cancer s/p excisinon, stroke, vascular dementia, atrial fibrillation, tobacco abuse, hep C, anxiety, depression admitted 12/11/2020 with small bowel obstruction and ischemic bowel. Initially seen at [facility] but left AMA and came to [facility] following day. S/P exploratory laparoscopy and bowel resection 12/7, further bowel resection 12/12 and found to have bowel  necrosis, and abd closure and ileostomy 12/15. Hospitalization complicated by recurrent respiratory failure and intubated x 3.Overall adult failure to thrive.Trach placed 12/29. Palliative assisting with goals of care.  Length of Stay: 39   Vital Signs: BP (!) 124/56   Pulse 89   Temp 99.2 F (37.3 C) (Axillary)   Resp (!) (P) 22   Ht 5\' 11"  (1.803 m)   Wt 64.1 kg   SpO2 98%   BMI 19.71 kg/m  SpO2: SpO2: 98 % O2 Device: O2 Device: (P) Ventilator O2 Flow Rate: O2 Flow Rate (L/min): 40 L/min       Palliative Assessment/Data: 10%     Palliative Care Plan    Recommendations/Plan:  Family firmly entrenched in decision for full support.  If he can stabilize would pursue LTAC.  PMT will continue to touch base intermittently and update the family.  Code Status:  Limited code  Prognosis:   < 2 weeks   Discharge Planning:  To Be Determined.  LTAC V. Hospital death would not be surprising.  Care plan was discussed with daughter Derrick Ravel.  Thank you for allowing the Palliative Medicine Team to assist in the care of this patient.  Total time spent:  35 min.     Greater than 50%  of this time was spent counseling and coordinating care related to the above assessment and plan.  Florentina Jenny, PA-C Palliative Medicine  Please contact Palliative MedicineTeam phone at [phone] for questions and concerns between 7 am - 7 pm.   Please see AMION for individual provider pager numbers.

## 2020-12-22 DIAGNOSIS — J9601 Acute respiratory failure with hypoxia: Secondary | ICD-10-CM | POA: Diagnosis not present

## 2020-12-22 DIAGNOSIS — E43 Unspecified severe protein-calorie malnutrition: Secondary | ICD-10-CM | POA: Diagnosis not present

## 2020-12-22 DIAGNOSIS — N179 Acute kidney failure, unspecified: Secondary | ICD-10-CM | POA: Diagnosis not present

## 2020-12-22 DIAGNOSIS — K651 Peritoneal abscess: Secondary | ICD-10-CM | POA: Diagnosis not present

## 2020-12-22 LAB — PHOSPHORUS: Phosphorus: 4.5 mg/dL (ref 2.5–4.6)

## 2020-12-22 LAB — COMPREHENSIVE METABOLIC PANEL
ALT: 25 U/L (ref 0–44)
AST: 32 U/L (ref 15–41)
Albumin: 1.4 g/dL — ABNORMAL LOW (ref 3.5–5.0)
Alkaline Phosphatase: 102 U/L (ref 38–126)
Anion gap: 15 (ref 5–15)
BUN: 137 mg/dL — ABNORMAL HIGH (ref 8–23)
CO2: 19 mmol/L — ABNORMAL LOW (ref 22–32)
Calcium: 8.1 mg/dL — ABNORMAL LOW (ref 8.9–10.3)
Chloride: 97 mmol/L — ABNORMAL LOW (ref 98–111)
Creatinine, Ser: 2.08 mg/dL — ABNORMAL HIGH (ref 0.61–1.24)
GFR, Estimated: 33 mL/min — ABNORMAL LOW (ref >60)
Glucose, Bld: 144 mg/dL — ABNORMAL HIGH (ref 70–99)
Potassium: 3.1 mmol/L — ABNORMAL LOW (ref 3.5–5.1)
Sodium: 131 mmol/L — ABNORMAL LOW (ref 135–145)
Total Bilirubin: 1.1 mg/dL (ref 0.3–1.2)
Total Protein: 6.6 g/dL (ref 6.5–8.1)

## 2020-12-22 LAB — DIFFERENTIAL
Abs Immature Granulocytes: 0 10*3/uL (ref 0.00–0.07)
Basophils Absolute: 0 10*3/uL (ref 0.0–0.1)
Basophils Relative: 0 %
Eosinophils Absolute: 0.2 10*3/uL (ref 0.0–0.5)
Eosinophils Relative: 1 %
Lymphocytes Relative: 4 %
Lymphs Abs: 1 10*3/uL (ref 0.7–4.0)
Monocytes Absolute: 1.9 10*3/uL — ABNORMAL HIGH (ref 0.1–1.0)
Monocytes Relative: 8 %
Neutro Abs: 21 10*3/uL — ABNORMAL HIGH (ref 1.7–7.7)
Neutrophils Relative %: 87 %
nRBC: 0 /100 WBC

## 2020-12-22 LAB — CBC
HCT: 24.4 % — ABNORMAL LOW (ref 39.0–52.0)
Hemoglobin: 7.7 g/dL — ABNORMAL LOW (ref 13.0–17.0)
MCH: 30.1 pg (ref 26.0–34.0)
MCHC: 31.6 g/dL (ref 30.0–36.0)
MCV: 95.3 fL (ref 80.0–100.0)
Platelets: 318 10*3/uL (ref 150–400)
RBC: 2.56 MIL/uL — ABNORMAL LOW (ref 4.22–5.81)
RDW: 20.4 % — ABNORMAL HIGH (ref 11.5–15.5)
WBC: 24.1 10*3/uL — ABNORMAL HIGH (ref 4.0–10.5)
nRBC: 1.8 % — ABNORMAL HIGH (ref 0.0–0.2)

## 2020-12-22 LAB — GLUCOSE, CAPILLARY
Glucose-Capillary: 123 mg/dL — ABNORMAL HIGH (ref 70–99)
Glucose-Capillary: 139 mg/dL — ABNORMAL HIGH (ref 70–99)
Glucose-Capillary: 139 mg/dL — ABNORMAL HIGH (ref 70–99)
Glucose-Capillary: 143 mg/dL — ABNORMAL HIGH (ref 70–99)
Glucose-Capillary: 144 mg/dL — ABNORMAL HIGH (ref 70–99)
Glucose-Capillary: 180 mg/dL — ABNORMAL HIGH (ref 70–99)

## 2020-12-22 LAB — TRIGLYCERIDES: Triglycerides: 68 mg/dL (ref <150)

## 2020-12-22 LAB — PREALBUMIN: Prealbumin: 12.5 mg/dL — ABNORMAL LOW (ref 18–38)

## 2020-12-22 LAB — MAGNESIUM: Magnesium: 2.8 mg/dL — ABNORMAL HIGH (ref 1.7–2.4)

## 2020-12-22 MED ORDER — OSMOLITE 1.5 CAL PO LIQD: 1000.0000 mL | ORAL | Status: DC

## 2020-12-22 MED ORDER — POTASSIUM CHLORIDE 10 MEQ/50ML IV SOLN
10.0000 meq | INTRAVENOUS | Status: AC
  Administered 2020-12-22 (×4): 10 meq via INTRAVENOUS
  Filled 2020-12-22 (×4): qty 50

## 2020-12-22 MED ORDER — TRACE MINERALS CU-MN-SE-ZN 300-55-60-3000 MCG/ML IV SOLN
INTRAVENOUS | Status: AC
  Filled 2020-12-22: qty 804

## 2020-12-22 MED ORDER — VITAL AF 1.2 CAL PO LIQD
1000.0000 mL | ORAL | Status: DC
  Administered 2020-12-22 – 2021-01-06 (×10): 1000 mL
  Filled 2020-12-22 (×3): qty 1000

## 2020-12-22 MED ORDER — HEPARIN SODIUM (PORCINE) 5000 UNIT/ML IJ SOLN
5000.0000 [IU] | Freq: Three times a day (TID) | INTRAMUSCULAR | Status: DC
  Administered 2020-12-22 – 2021-01-12 (×64): 5000 [IU] via SUBCUTANEOUS
  Filled 2020-12-22 (×65): qty 1

## 2020-12-22 MED ORDER — TRACE MINERALS CU-MN-SE-ZN 300-55-60-3000 MCG/ML IV SOLN
INTRAVENOUS | Status: DC
  Filled 2020-12-22: qty 804

## 2020-12-22 MED ORDER — FUROSEMIDE 10 MG/ML IJ SOLN
40.0000 mg | Freq: Once | INTRAMUSCULAR | Status: AC
  Administered 2020-12-22: 40 mg via INTRAVENOUS
  Filled 2020-12-22: qty 4

## 2020-12-22 NOTE — Progress Notes (Signed)
NAME:  Ronald Wolf, MRN:  [id number], DOB:  16-Mar-1946, LOS: 22 ADMISSION DATE:  11/29/2020, CONSULTATION DATE:  12/7 REFERRING MD:  Cyndia Skeeters, CHIEF COMPLAINT:  Abdominal pain   Brief History:  Mr.Bobak is a 75 yo M w/ PMH of CVA, Seizures, Dementia, Bladder ca s/p urostomy, tobacco use, right glottic carcinoma s/p excision presenting with sbo. Found to have ischemic bowel requiring ex-lap 12/7.  Course complicated by septic shock, acute respiratory failure and AKI return to the OR 12/12 for necrotic small bowel resection of additional 70 cm inclusive of prior anastomosis 12/15 abdominal closure with ileostomy He has had a prolonged ICU stay with ventilator dependence, had a tracheostomy on 12/29  Past Medical History:  CVA, Dementia, Bladder CA s/p nephrostomy, tobacco use, right glottic carcinoma s/p excision, seizures.  Significant Hospital Events:  11/16/2020 Admission 11/21/2020 OR 12/02/2020 Admit to ICU 12/10 Brief SVT with associated hypertension. Self limited Given fentanyl, metop and hydral for HTN 12/11 Good urine output with Lasix , failed extubation, reintubated within 2 hours due to inability to clear secretions 12/12 return OR for enteric leak-- found to have bowel necrosis. Left in discontinuity  12/13 self extubated, reintubated. On pressors. Family meeting with PCCM and CCS to discuss possible next steps.  12/15-was back in OR, abdominal closure, RUQ mucus fistula and L ileostomy  12/19: No significant overnight events, high ileostomy output 12/21: scheduled for family meeting. Worse hypernatremia, continued high ileostomy output.  Attempted PSV/CPAP wean, pulled volumes of high 100s -low/mid 200s with tachypnea. Placed back of full support.  12/22 ongoing high output GI/ ileostomy, TPN started; weaned 12/5 all day; off fentanyl gtt, remains on precedex  12/24 IR drain , 1 unit PRBC transfusion for hemoglobin 6.5 12/26 added vancomycin for GPC in blood cultures and RIC suggesting  MRSA 12/29 tracheostomy 12/31 Palliative discussion > family understanding that his situation is critical and he has not made much improvement  1/6 ongoing palliative conversations. No progress from pt standpoint. R sided facial droop and not following commands, flaccid RUE tone -- STAT CT H without acute abnormality, large old L MCA stroke  1/7 low-grade fever, failed to wean, sister updated 1/8 low-grade fever, transfused 2 units PRBC for hemoglobin dropped to 5.3, blood clots suctioned out from trachea  Consults:  General Surgery PCCM Palliative Care  Procedures:  12/08/2020 Ex-lap, G-tube placement 12/7 PICC >> ETT 12/7 >> 12/11, >> 12/15- back to OR for abdominal closure, exploratory laparotomy  12/24 CT-guided right pelvic abscess drain 12/29 trach  Significant Diagnostic Tests:  CT abd/pelvis 12/2 > High-grade distal small bowel obstruction, possibly due to adhesion. Mild ascites and diffuse mesenteric edema. No evidence of focal inflammatory process or abscess. Small bilateral pleural effusions and mild right lower lobe Atelectasis. CT abdomen/pelvis 12/23 Interval development of a large loculated collection in the low anterior abdomen and pelvis measuring approximately 6.1 x 11.3 x12.1 cm in size subjacent to the operative site. Few foci of internal gas are present. , no frank spillage of enteric contrast   CT H 1/6 > no acute intracranial abnormality. Chronic L MCA changes from prior L MCA infarct. Atrophic and chronic white matter ischemic changes.  Stable L basal ganglia infarcts   Micro Data:  11/25/2020 COVID/Flu negative 12/7 BA: Few GNR rare GPC -- predominantly PMN  12/7 BCx> Neg 12/11 respiratory -no organism in tracheal aspirate 12/24 BC >> RIC showing both staph epidermis 1/4 12/24 abscess >> E. Faecalis, B fragilis (B lactamase) 12/27 trach  aspirate>> normal flora  Antimicrobials:  Fluc 12/8>> 12/10 Zosyn 12/8> 12/12 cefotetain 12/15>> off Zosyn 12/23 >>  12/30 eraxis 12/24 >> 1/2 Vancomycin 12/26 >>12/31 Unasyn 12/31 >1/9 zosyn 1/9>> vanc 1/9 >>  Interim History / Subjective:     Objective   Blood pressure (!) 135/56, pulse 90, temperature 98.5 F (36.9 C), temperature source Axillary, resp. rate (!) 21, height 5' 11"  (1.803 m), weight 64.1 kg, SpO2 97 %.    Vent Mode: PRVC FiO2 (%):  [40 %] 40 % Set Rate:  [14 bmp] 14 bmp Vt Set:  [600 mL] 600 mL PEEP:  [5 cmH20] 5 cmH20 Plateau Pressure:  [21 cmH20-25 cmH20] 25 cmH20   Intake/Output Summary (Last 24 hours) at 12/22/2020 0819 Last data filed at 12/22/2020 0800 Gross per 24 hour  Intake 3578.82 ml  Output 3400 ml  Net 178.82 ml   Filed Weights   12/17/20 0500 12/18/20 0411 12/19/20 0431  Weight: 64.1 kg 64.1 kg 64.1 kg    Examination: General: Frail, critically ill appearing older adult M trach/vent NAD  HENT: [place] temporal muscle wasting. Anicteric sclera. No bleeding from tracheostomy site.  PULM: No accessory muscle use, bilateral ventilated breath sounds, rhonchi bilaterally. Tolerating PSV CV: rrr s1s2 cap refill < 3 sec  GI: R sided mucus fistula. Midline abdominal incision with granulomatous tissue, no exudate or purulence. LUQ Gtube. LLQ ileostomy with liquid output IR drain with brown output. Abdomen is distended, still tender with absent bowel sounds.  GU: RUQ urostomy with chunks of tissue in catheter, edematous penis  MSK: BUE pitting edema. No obvious joint deformity. Muscle atrophy BUE BLE  Neuro: Awake, does not follow commands, purposeful Skin: c/d/w    Resolved Hospital Problem list     Assessment & Plan:   Sepsis without shock due Intra-abdominal abscess or possible HCAP -new right upper lobe infiltrate 1/9 - Stop vancomycin as no evidence of MRSA/E.faecium - Continue Zosyn - duration of therapy to be determined by abscess resolution.   Critically ill due to acute hypoxemic respiratory failure requiring mechanical ventilation S/p trach Bilateral  pleural effusions due to malnutrition  Trach bleeding due to suction trauma - trach collar trials today.    AKI due to intravascular volume contraction from third space losses, hypoalbuminemia Hypokalemia Hyponatremia -trend UOP, renal indices -Avoid nephrotoxins  -Adjust meds to renal function -Replete potassium IV and in TPN. -Limit free water  -Trial of gentle diuresis.   Acute encephalopathy, multifactorial Prior L MCA CVA -New R sided facial droop/ flaccid RUE tone 1/6, CT head negative -Supportive care -Not on continuous sedation.  -With drop in hemoglobin, hold off on Plavix   SBO Ischemic bowel perf s/p ex-lap x 2 for resection, mucus fistula ileostomy, G tube placement  IAA, now s/p drain  Severe protein calorie malnutrition High ileostomy output, enteral malabsorption  -CCS following -EN; unfortunately not absorbing -- doubt this will improve  -Imodium, opium, EN morphine in effort to slow ostomy output -TPN  -General surgery to follow again tomorrow-I doubt he is a candidate for any more surgical -procedures, will obtain CT abdomen to reassess abscess and duration of antibiotics - Change to elemental feeds  Hypertension, uncontrolled Tachybradycardia syndrome -Avoid beta-blocker -Decreased hydralazine 50 every 8  Atrial fibrillation with RVR> resolved -Not on anticoagulation because of recent surgery  Seizures well controlled -Continue Keppra, reduce dose to renal function  Bladder Cancer -Routine urostomy care  Anemia, no obvious source of ongoing blood loss, hemoglobin dropped from 7.2-5.6 >>  improved to 8.5 with 2 units PRBC 1/8 Repeat hemoglobin 1/9 again dropped to 6.2>> 7.7 on repeat No signs of bleeding , feel that low a.m. hemoglobin values likely related to false low?  Drawn from PICC line transfusing TNA Monitor H&H PRN Hold off further transfusions  Goals of Care Ongoing GOC convos (many interdisciplinary meetings, now being followed by  palliative care) Poor prognosis has been communicated to family by several providers, I did this again 1/7 & 1/8 to Ermalinda Memos and brother at bedside.   Family seems to be having hard time accepting poor prognosis.   I have asked surgeons to communicate with family he is probably not a candidate for any more procedures and we should be discussing transfer to the next venue?  LTAC versus chronic vent facility.   Daily Goals Checklist  Pain/Anxiety/Delirium protocol (if indicated): enteral morphine only.  VAP protocol (if indicated): bundle in place Respiratory support goals: trach collar trials today.  Blood pressure target: SBP<160 DVT prophylaxis: start subcutaneous heparin Nutrition Status: severe malnutrition, continue TPN and try elemental feeds GI prophylaxis: pantoprazole Fluid status goals: gentle diuresis Urinary catheter: Guide hemodynamic management Central lines: PICC Glucose control: sliding scale Mobility/therapy needs: bedrest Antibiotic de-escalation: Zosyn. Home medication reconciliation: on hold  Daily labs: CBC, BMP Code Status: No CPR but continue aggressive care otherwise  Family Communication: Last updated 1/8 Disposition: ICU  Goals of Care:  Last date of multidisciplinary goals of care discussion: 62/4 Family and staff present: Patient's daughter Ms. Alveta Heimlich Summary of discussion:  1/5- Palliative care met with family - -desired for ongoing aggressive medical care as offered, limited code blue Code Status: limited code blue no shock, no CPR  Labs   CBC: Recent Labs  Lab 12/20/20 0523 12/20/20 1424 12/21/20 0559 12/21/20 0810 12/22/20 0547  WBC 25.1*  --  20.0* 24.5* 24.1*  NEUTROABS  --   --   --  19.0* 21.0*  HGB 5.6* 8.5* 6.2* 7.7* 7.7*  HCT 18.4* 25.6* 19.1* 24.3* 24.4*  MCV 102.8*  --  93.6 94.9 95.3  PLT 395  --  281 354 921    Basic Metabolic Panel: Recent Labs  Lab 12/18/20 0437 12/19/20 0503 12/20/20 0523 12/21/20 0559  12/22/20 0547  NA 134* 136 133* 135 131*  K 4.3 3.3* 3.1* 3.1* 3.1*  CL 106 103 103 102 97*  CO2 19* 20* 19* 18* 19*  GLUCOSE 206* 179* 184* 170* 144*  BUN 88* 94* 106* 121* 137*  CREATININE 1.45* 1.60* 1.74* 2.15* 2.08*  CALCIUM 7.8* 7.9* 7.4* 7.7* 8.1*  MG 2.5* 2.7* 2.8* 2.6* 2.8*  PHOS 5.8*  --  4.7* 5.1* 4.5   GFR: Estimated Creatinine Clearance: 28.2 mL/min (A) (by C-G formula based on SCr of 2.08 mg/dL (H)). Recent Labs  Lab 12/20/20 0523 12/21/20 0559 12/21/20 0810 12/22/20 0547  WBC 25.1* 20.0* 24.5* 24.1*    Liver Function Tests: Recent Labs  Lab 12/18/20 0437 12/21/20 0559 12/22/20 0547  AST 27 27 32  ALT 29 24 25   ALKPHOS 95 74 102  BILITOT 1.1 1.4* 1.1  PROT 5.8* 5.8* 6.6  ALBUMIN 1.5* 1.4* 1.4*   No results for input(s): LIPASE, AMYLASE in the last 168 hours. No results for input(s): AMMONIA in the last 168 hours.  ABG    Component Value Date/Time   PHART 7.361 12/07/2020 2113   PCO2ART 47.6 12/04/2020 2113   PO2ART 41 (L) 11/16/2020 2113   HCO3 9.4 (L) 12/08/2020 0822   TCO2  28 11/30/2020 2113   ACIDBASEDEF 19.3 (H) 12/08/2020 0822   O2SAT 78.5 12/08/2020 0822     CRITICAL CARE  Performed by: Kipp Brood   Total critical care time: 40 minutes  Critical care time was exclusive of separately billable procedures and treating other patients.  Critical care was necessary to treat or prevent imminent or life-threatening deterioration.  Critical care was time spent personally by me on the following activities: development of treatment plan with patient and/or surrogate as well as nursing, discussions with consultants, evaluation of patient's response to treatment, examination of patient, obtaining history from patient or surrogate, ordering and performing treatments and interventions, ordering and review of laboratory studies, ordering and review of radiographic studies, pulse oximetry, re-evaluation of patient's condition and participation in  multidisciplinary rounds.  Kipp Brood, MD [facility] ICU Physician [place]  Pager: [phone] Mobile: [phone] After hours: [phone].    12/22/2020, 8:19 AM

## 2020-12-22 NOTE — Progress Notes (Signed)
[facility] Progress Note  26 Days Post-Op  Subjective: CC-  Low grade fevers over the weekend. Patient was pan-cultured which are currently negative. CT abdomen/pelvis 1/9 showed no evidence of significant residual fluid collection and no new fluid collections. IR drain with 40cc drainage last 24 hours.   Objective: Vital signs in last 24 hours: Temp:  [98.5 F (36.9 C)-99.4 F (37.4 C)] 98.5 F (36.9 C) (01/10 0400) Pulse Rate:  [85-100] 90 (01/10 0800) Resp:  [21-29] 21 (01/10 0329) BP: (101-160)/(44-63) 135/56 (01/10 0800) SpO2:  [94 %-100 %] 97 % (01/10 0800) FiO2 (%):  [40 %] 40 % (01/10 0329) Last BM Date: 12/22/20  Intake/Output from previous day: 01/09 0701 - 01/10 0700 In: 3423.8 [I.V.:2295; NG/GT:760; IV Piggyback:368.8] Out: 3400 [Urine:1050; Drains:50; Stool:2300] Intake/Output this shift: Total I/O In: 255.1 [I.V.:200; NG/GT:30; IV Piggyback:25] Out: -   PE: Gen:  Critically ill appearing HEENT: trach in place Card:  RRR Pulm:  mechanically ventilated Abd: soft, NT, open midline woundwith fibrinous exudate and some tissue necrosis but fascia intact/ sutures visible,RUQ ostomy(mucus fistula)viable with scant tan output, LLQ ostomy(end ileostomy)pink with profuse TF-appearing output, urostomy productive, g-tubesite c/d with TF running at 30cc/hr, IR drain with scant purulent fluid in bag Neuro: opens eyes briefly but does not respond or follow commands  Skin: warm and dry   Lab Results:  Recent Labs    12/21/20 0810 12/22/20 0547  WBC 24.5* 24.1*  HGB 7.7* 7.7*  HCT 24.3* 24.4*  PLT 354 318   BMET Recent Labs    12/21/20 0559 12/22/20 0547  NA 135 131*  K 3.1* 3.1*  CL 102 97*  CO2 18* 19*  GLUCOSE 170* 144*  BUN 121* 137*  CREATININE 2.15* 2.08*  CALCIUM 7.7* 8.1*   PT/INR No results for input(s): LABPROT, INR in the last 72 hours. CMP     Component Value Date/Time   NA 131 (L) 12/22/2020 0547   NA 145 03/08/2016  1356   K 3.1 (L) 12/22/2020 0547   K 4.5 03/08/2016 1356   CL 97 (L) 12/22/2020 0547   CO2 19 (L) 12/22/2020 0547   CO2 28 03/08/2016 1356   GLUCOSE 144 (H) 12/22/2020 0547   GLUCOSE 98 03/08/2016 1356   BUN 137 (H) 12/22/2020 0547   BUN 14.2 03/08/2016 1356   CREATININE 2.08 (H) 12/22/2020 0547   CREATININE 0.9 03/08/2016 1356   CALCIUM 8.1 (L) 12/22/2020 0547   CALCIUM 9.8 03/08/2016 1356   PROT 6.6 12/22/2020 0547   PROT 8.2 03/08/2016 1356   ALBUMIN 1.4 (L) 12/22/2020 0547   ALBUMIN 3.6 03/08/2016 1356   AST 32 12/22/2020 0547   AST 42 (H) 03/08/2016 1356   ALT 25 12/22/2020 0547   ALT 43 03/08/2016 1356   ALKPHOS 102 12/22/2020 0547   ALKPHOS 79 03/08/2016 1356   BILITOT 1.1 12/22/2020 0547   BILITOT 0.74 03/08/2016 1356   GFRNONAA 33 (L) 12/22/2020 0547   GFRAA >60 12/31/2019 0221   Lipase     Component Value Date/Time   LIPASE 57 (H) 11/13/2020 1830       Studies/Results: CT ABDOMEN PELVIS WO CONTRAST  Result Date: 12/21/2020 CLINICAL DATA:  Suspected abdominal abscess or infection. Follow-up drains. EXAM: CT ABDOMEN AND PELVIS WITHOUT CONTRAST TECHNIQUE: Multidetector CT imaging of the abdomen and pelvis was performed following the standard protocol without IV contrast. COMPARISON:  12/04/2020 FINDINGS: Lower chest: Bilateral pleural effusions layering dependently with collapse of both lower lobes. Hepatobiliary: No significant  liver parenchymal finding. 1 cm cyst in the left lobe as seen previously. Pancreas: Normal Spleen: Normal Adrenals/Urinary Tract: Adrenal glands are normal. Kidneys are normal. Previous cystectomy. Right lower quadrant ileal conduit appears unremarkable by CT. Stomach/Bowel: No sign of bowel obstruction. No sign bowel inflammatory process. Left lower quadrant ostomy without complicating feature. Vascular/Lymphatic: Aortic atherosclerosis. No aneurysm. IVC is normal. No retroperitoneal adenopathy or hematoma. Reproductive: Previous penile  implant. Other: Drainage catheter in the pelvic midline collection has successfully drained the collection, without evidence significant residua. No second collection is identified in any other location. Very small amount of free intraperitoneal fluid, grossly freely distributed. Musculoskeletal: Ordinary lumbar and hip degenerative changes. IMPRESSION: 1. Drainage catheter in the pelvic midline collection has successfully drained the collection, without evidence significant residua. No second collection is identified in any other location. 2. Bilateral pleural effusions layering dependently with collapse of both lower lobes. 3. 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 Left lower quadrant bowel ostomy without complicating feature. 4. Aortic atherosclerosis. Aortic Atherosclerosis (ICD10-I70.0). Electronically Signed   By: Nelson Chimes M.D.   On: 12/21/2020 12:29   DG Chest Port 1 View  Result Date: 12/21/2020 CLINICAL DATA:  Acute respiratory failure EXAM: PORTABLE CHEST 1 VIEW COMPARISON:  Yesterday FINDINGS: Normal heart size and mediastinal contours. Extensive opacification of the lower chest with hazy density. More airspace type ill-defined opacity in the right upper lobe that is new/progressed. No visible pneumothorax. Left PICC with tip at the upper cavoatrial junction. Tracheostomy tube in place. IMPRESSION: 1. Increased airspace disease in the right upper lobe. 2. Hazy opacity at the bases usually from pleural fluid/pulmonary collapse. Electronically Signed   By: Monte Fantasia M.D.   On: 12/21/2020 08:33   DG CHEST PORT 1 VIEW  Result Date: 12/20/2020 CLINICAL DATA:  Respiratory failure EXAM: PORTABLE CHEST 1 VIEW COMPARISON:  December 13, 2020 FINDINGS: A left PICC line is in good position. The tracheostomy tube is stable. No pneumothorax. Opacity in the right base is worsened in the interval. There may be a layering effusion on the right as well.  Increased interstitial markings throughout the remainder of the right lung. No focal infiltrate seen on the left. No other abnormalities. IMPRESSION: The findings in the right base may represent layering effusion with underlying atelectasis or developing pneumonia. Recommend clinical correlation. Electronically Signed   By: Dorise Bullion III M.D   On: 12/20/2020 14:20    Anti-infectives: Anti-infectives (From admission, onward)   Start     Dose/Rate Route Frequency Ordered Stop   12/21/20 1130  vancomycin (VANCOREADY) IVPB 1250 mg/250 mL        1,250 mg 166.7 mL/hr over 90 Minutes Intravenous Every 48 hours 12/21/20 1032     12/21/20 1115  piperacillin-tazobactam (ZOSYN) IVPB 3.375 g        3.375 g 12.5 mL/hr over 240 Minutes Intravenous Every 8 hours 12/21/20 1022     12/12/20 1800  Ampicillin-Sulbactam (UNASYN) 3 g in sodium chloride 0.9 % 100 mL IVPB  Status:  Discontinued        3 g 200 mL/hr over 30 Minutes Intravenous Every 6 hours 12/12/20 1101 12/21/20 1022   12/08/20 1300  vancomycin (VANCOCIN) IVPB 1000 mg/200 mL premix  Status:  Discontinued        1,000 mg 200 mL/hr over 60 Minutes Intravenous Every 24 hours 12/08/20 0845 12/12/20 1059   12/08/20 0100  vancomycin (VANCOREADY) IVPB 500 mg/100 mL  Status:  Discontinued  500 mg 100 mL/hr over 60 Minutes Intravenous Every 12 hours 12/07/20 1130 12/07/20 1337   12/07/20 1215  vancomycin (VANCOREADY) IVPB 1250 mg/250 mL        1,250 mg 166.7 mL/hr over 90 Minutes Intravenous  Once 12/07/20 1124 12/07/20 1336   12/06/20 1030  anidulafungin (ERAXIS) 100 mg in sodium chloride 0.9 % 100 mL IVPB  Status:  Discontinued       "Followed by" Linked Group Details   100 mg 78 mL/hr over 100 Minutes Intravenous Every 24 hours 12/05/20 0939 12/13/20 1230   12/05/20 1030  anidulafungin (ERAXIS) 200 mg in sodium chloride 0.9 % 200 mL IVPB       "Followed by" Linked Group Details   200 mg 78 mL/hr over 200 Minutes Intravenous  Once  12/05/20 0939 12/05/20 1618   12/04/20 1500  piperacillin-tazobactam (ZOSYN) IVPB 3.375 g  Status:  Discontinued        3.375 g 12.5 mL/hr over 240 Minutes Intravenous Every 8 hours 12/04/20 0818 12/12/20 1059   12/04/20 0915  piperacillin-tazobactam (ZOSYN) IVPB 3.375 g        3.375 g 100 mL/hr over 30 Minutes Intravenous  Once 12/04/20 0818 12/04/20 1207   11/21/2020 0945  cefoTEtan (CEFOTAN) 2 g in sodium chloride 0.9 % 100 mL IVPB  Status:  Discontinued        2 g 200 mL/hr over 30 Minutes Intravenous To Surgery 12/10/2020 0936 12/04/2020 1057   12/01/2020 0900  cefoTEtan (CEFOTAN) 2 g in sodium chloride 0.9 % 100 mL IVPB  Status:  Discontinued        2 g 200 mL/hr over 30 Minutes Intravenous  Once 11/28/2020 0833 11/13/2020 0936   11/19/20 1130  piperacillin-tazobactam (ZOSYN) IVPB 3.375 g        3.375 g 12.5 mL/hr over 240 Minutes Intravenous Every 8 hours 11/19/20 1017 12/07/2020 2359   11/19/20 1115  fluconazole (DIFLUCAN) IVPB 200 mg        200 mg 100 mL/hr over 60 Minutes Intravenous Every 24 hours 11/19/20 1017 11/21/20 1104   11/19/20 0915  Ampicillin-Sulbactam (UNASYN) 3 g in sodium chloride 0.9 % 100 mL IVPB  Status:  Discontinued        3 g 200 mL/hr over 30 Minutes Intravenous Every 12 hours 11/19/20 0826 11/19/20 0949       Assessment/Plan SBO -s/p ex lap with SBR, placement of a gastrostomy tube12/7 by Dr. Georgette Dover. Takeback 12/12 by Dr. Zenia Resides for necrotic SB with resection of additional 60-70cm of SB inclusive of prior anastomosis in discontinuity with open abdomen.  - S/P ex lap, ileostomy, mucous fistula, closure 12/15 by Dr. Grandville Silos Malnourishment/FEN- Continue TNA. Decrease TF feeds to 20 today given high ileostomy output. Continue fiber, imodium, lomotil, tincture of opium VDRF- s/p tracheostomy 12/29 ID- currently zosyn/vancomycin 1/9>> (pancultures currently negative). unasyn 12/31>>1/9 Intra-abdominal abscess - seen on CT 12/23,s/p perc drain by IR 12/24. Culture  with Enterococcus faecalis and Bacteroides fragilis, beta lactamase positive. CT 1/9 showed no evidence of significant residual fluid collection and no new fluid collections. IR drain with 40cc drainage last 24 hours CV/AF RVR- in NSRcurrently  VTE-SQH held for anemia Dispo - ICU. Decrease TF to 20cc/hr as above. Continue [place] discussions with palliative.   LOS: 40 days    [facility] 12/22/2020, 8:31 AM Please see Amion for pager number during day hours 7:00am-4:30pm

## 2020-12-22 NOTE — Progress Notes (Addendum)
PHARMACY - TOTAL PARENTERAL NUTRITION CONSULT NOTE  Indication: Small bowel obstruction, prolonged ileus  Patient Measurements: Height: 5\' 11"  (180.3 cm) Weight: 61.2 kg (134 lb 14.7 oz) IBW/kg (Calculated) : 75.3 TPN AdjBW (KG): 60.8 Body mass index is 18.82 kg/m. Usual Weight: 137 lbs  Assessment: 74 YOM presented December 12, 2020 with nausea, vomiting, abdominal pain after leaving AMA at [facility] with known SBO. Patient has been on bowel rest with NG tube decompression. Patient continued to have persistent SBO on imaging with no evidence of improvement, now s/p ex-lap with LoA, SBR and placement of Gtube on 12/02/2020. Pharmacy consulted for TPN management - patient received TPN 11/19/20 through 11/29/20.  Reconsulted to resume TPN on 12/2. TF currently at 30 mL/hr given high ostomy output and concern for tube feeds not absorbing. Patient on loperamide, lomotil, tincture of opium and fiber without improvement in output.   Glucose / Insulin: no hx DM - CBGs <180  - utilized 9 units mSSI/24hrs Electrolytes: Na 135>131 (FW adjusted to 27ml PT q8h), K 3.1 stable (s/p K runs x 4), CO2 improved to 19, phos 4.5 (reduced in TPN yesterday), mag 2.6 > 2.8; CorrCa 9.8, others WNL Renal: Scr trending down to 2.08, BUN up to 137 LFTs / TGs: LFTs / TG wnl, Tbili up to 1.4 Prealbumin / albumin: Prealbumin 17.9 > 12.5, albumin 1.4 Intake / Output; MIVF: UOP 0.7 ml/kg/hr, ileostomy output/24h: 2370mL (on fiber, opium tincture, lomotil, loperamide and unlikely to improve per Surgery), drain O/P 51ml, net +18L GI Imaging: 12/11 DG Abd - stomach gas-filled, without distention, no obvious free air 12/23 CT abd pelvis - large loculated collection in low abd/pelvis, few foci of internal gas present, new RUQ ileostomy, thickened distal SB/colon, subtotal atelectatic collapse of LLL, partial collapse of RLL, small R pleural effusion, body wall edema Surgeries / Procedures: 12/12: re-exploration and washout with new copious  drainage from incision concerning for enteric leak vs. enterotomy >> now s/p, bowel necrosis resected 12/15: ex-lap with ileostomy with fistula creation, abdominal closure 12/24 Pelvic abscess drain 12/29 - trach placed  Central access: PICC placed 12/01/2020 TPN start date: 11/19/20>>11/29/20; restart 12/03/20 >>  Nutritional Goals (updated per RD rec 12/30): 2300-2700 kCal, 120-135g protein, >2L fluid per day  Goal TPN rate of 75 cc/hr will provide 120g AA, 432g CHO, and 36g lipids (15% of total kCal due to [employer]) for a total of 2311 kcal, meeting 100% of needs  Current Nutrition:  TPN Osmolite 1.5 at 30 ml/hr (goal rate 65 ml/hr) - not advancing rate d/t ileostomy output ProSource TF 45 ml TID (each 11g AA and 40 kcal)  Plan:  Concentrate TPN to new goal rate 75 ml/hr at 1800. TPN will provide 120g protein, 432g CHO, 36g SMOF lipids (~15% of total kcal due to national shortage), and 2311 kcal - meeting 100% of estimated needs. 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 F/u long-term plans, GOC discussions   Albertina Parr, PharmD., BCPS, BCCCP Clinical Pharmacist Please refer to [facility] for unit-specific pharmacist

## 2020-12-23 DIAGNOSIS — K651 Peritoneal abscess: Secondary | ICD-10-CM | POA: Diagnosis not present

## 2020-12-23 DIAGNOSIS — E43 Unspecified severe protein-calorie malnutrition: Secondary | ICD-10-CM | POA: Diagnosis not present

## 2020-12-23 DIAGNOSIS — J9601 Acute respiratory failure with hypoxia: Secondary | ICD-10-CM | POA: Diagnosis not present

## 2020-12-23 DIAGNOSIS — N179 Acute kidney failure, unspecified: Secondary | ICD-10-CM | POA: Diagnosis not present

## 2020-12-23 LAB — BASIC METABOLIC PANEL
Anion gap: 19 — ABNORMAL HIGH (ref 5–15)
BUN: 150 mg/dL — ABNORMAL HIGH (ref 8–23)
CO2: 19 mmol/L — ABNORMAL LOW (ref 22–32)
Calcium: 8 mg/dL — ABNORMAL LOW (ref 8.9–10.3)
Chloride: 94 mmol/L — ABNORMAL LOW (ref 98–111)
Creatinine, Ser: 2.27 mg/dL — ABNORMAL HIGH (ref 0.61–1.24)
GFR, Estimated: 30 mL/min — ABNORMAL LOW (ref >60)
Glucose, Bld: 154 mg/dL — ABNORMAL HIGH (ref 70–99)
Potassium: 3.4 mmol/L — ABNORMAL LOW (ref 3.5–5.1)
Sodium: 132 mmol/L — ABNORMAL LOW (ref 135–145)

## 2020-12-23 LAB — PHOSPHORUS: Phosphorus: 4.3 mg/dL (ref 2.5–4.6)

## 2020-12-23 LAB — GLUCOSE, CAPILLARY
Glucose-Capillary: 131 mg/dL — ABNORMAL HIGH (ref 70–99)
Glucose-Capillary: 136 mg/dL — ABNORMAL HIGH (ref 70–99)
Glucose-Capillary: 142 mg/dL — ABNORMAL HIGH (ref 70–99)
Glucose-Capillary: 214 mg/dL — ABNORMAL HIGH (ref 70–99)
Glucose-Capillary: 157 mg/dL — ABNORMAL HIGH (ref 70–99)
Glucose-Capillary: 145 mg/dL — ABNORMAL HIGH (ref 70–99)

## 2020-12-23 LAB — MAGNESIUM: Magnesium: 2.4 mg/dL (ref 1.7–2.4)

## 2020-12-23 MED ORDER — POTASSIUM CHLORIDE 10 MEQ/50ML IV SOLN
10.0000 meq | INTRAVENOUS | Status: AC
  Administered 2020-12-23 (×4): 10 meq via INTRAVENOUS
  Filled 2020-12-23 (×4): qty 50

## 2020-12-23 MED ORDER — OCTREOTIDE ACETATE 50 MCG/ML IJ SOLN
50.0000 ug | Freq: Two times a day (BID) | INTRAMUSCULAR | Status: DC
  Administered 2020-12-23 – 2020-12-24 (×2): 50 ug via SUBCUTANEOUS
  Filled 2020-12-23 (×3): qty 1

## 2020-12-23 MED ORDER — TRACE MINERALS CU-MN-SE-ZN 300-55-60-3000 MCG/ML IV SOLN
INTRAVENOUS | Status: AC
  Filled 2020-12-23: qty 804

## 2020-12-23 NOTE — Progress Notes (Signed)
Referring Physician(s): Dr Ayesha Rumpf  Supervising Physician: Jacqulynn Cadet  Patient Status:  [facility] - In-pt  Chief Complaint:  Abdominal abscess drain placed in IR 12/24  Subjective:  SBO -s/p ex lap with SBR, placement of a gastrostomy tube12/7 by Dr. Georgette Dover. Takeback 12/12 by Dr. Zenia Resides for necrotic SB with resection of additional 60-70cm of SB inclusive of prior anastomosis in discontinuity with open abdomen. - S/P ex lap, ileostomy, mucous fistula, closure 12/15 by Dr. Grandville Silos  CT 1/9: IMPRESSION: 1. Drainage catheter in the pelvic midline collection has successfully drained the collection, without evidence significant residua. No second collection is identified in any other location. 2. Bilateral pleural effusions layering dependently with collapse of both lower lobes. 3. 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 Left lower quadrant bowel ostomy without complicating feature.  OP of drain still 50 cc/30 cc in last 2 days   Allergies: Iohexol  Medications: Prior to Admission medications   Medication Sig Start Date End Date Taking? Authorizing Provider  amLODipine (NORVASC) 10 MG tablet Take 1 tablet (10 mg total) by mouth daily. 02/11/20  Yes Mosie Lukes, MD  atorvastatin (LIPITOR) 80 MG tablet TAKE 1 TABLET(80 MG) BY MOUTH DAILY AT 6 PM 02/11/20  Yes Mosie Lukes, MD  carvedilol (COREG) 3.125 MG tablet Take 1 tablet (3.125 mg total) by mouth 2 (two) times daily with a meal. 02/11/20  Yes Mosie Lukes, MD  clopidogrel (PLAVIX) 75 MG tablet TAKE 1 TABLET(75 MG) BY MOUTH DAILY Patient taking differently: Take 75 mg by mouth daily. TAKE 1 TABLET(75 MG) BY MOUTH DAILY 08/21/20  Yes Mosie Lukes, MD  folic acid (FOLVITE) 1 MG tablet Take 1 tablet (1 mg total) by mouth daily. 02/11/20  Yes Mosie Lukes, MD  levETIRAcetam (KEPPRA) 750 MG tablet TAKE 1 TABLET(750 MG) BY MOUTH TWICE DAILY Patient taking differently:  Take 750 mg by mouth 2 (two) times daily.  06/30/20  Yes Mosie Lukes, MD  mirtazapine (REMERON) 15 MG tablet Take 15 mg by mouth at bedtime. 11/04/20  Yes [provider]  tetrahydrozoline-zinc (VISINE-AC) 0.05-0.25 % ophthalmic solution Place 1 drop into both eyes daily as needed (dry eyes).    Yes [provider]     Vital Signs: BP (!) 134/50   Pulse 89   Temp 98.5 F (36.9 C) (Axillary)   Resp (!) 29   Ht 5\' 11"  (1.803 m)   Wt 141 lb 5 oz (64.1 kg)   SpO2 96%   BMI 19.71 kg/m   Physical Exam Skin:    General: Skin is warm.     Comments: Site is clean and dry NT no bleeding OP is brown milky fluid      Imaging: CT ABDOMEN PELVIS WO CONTRAST  Result Date: 12/21/2020 CLINICAL DATA:  Suspected abdominal abscess or infection. Follow-up drains. EXAM: CT ABDOMEN AND PELVIS WITHOUT CONTRAST TECHNIQUE: Multidetector CT imaging of the abdomen and pelvis was performed following the standard protocol without IV contrast. COMPARISON:  12/04/2020 FINDINGS: Lower chest: Bilateral pleural effusions layering dependently with collapse of both lower lobes. Hepatobiliary: No significant liver parenchymal finding. 1 cm cyst in the left lobe as seen previously. Pancreas: Normal Spleen: Normal Adrenals/Urinary Tract: Adrenal glands are normal. Kidneys are normal. Previous cystectomy. Right lower quadrant ileal conduit appears unremarkable by CT. Stomach/Bowel: No sign of bowel obstruction. No sign bowel inflammatory process. Left lower quadrant ostomy without complicating feature. Vascular/Lymphatic: Aortic atherosclerosis. No aneurysm.  IVC is normal. No retroperitoneal adenopathy or hematoma. Reproductive: Previous penile implant. Other: Drainage catheter in the pelvic midline collection has successfully drained the collection, without evidence significant residua. No second collection is identified in any other location. Very small amount of free intraperitoneal fluid, grossly  freely distributed. Musculoskeletal: Ordinary lumbar and hip degenerative changes. IMPRESSION: 1. Drainage catheter in the pelvic midline collection has successfully drained the collection, without evidence significant residua. No second collection is identified in any other location. 2. Bilateral pleural effusions layering dependently with collapse of both lower lobes. 3. 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 Left lower quadrant bowel ostomy without complicating feature. 4. Aortic atherosclerosis. Aortic Atherosclerosis (ICD10-I70.0). Electronically Signed   By: Nelson Chimes M.D.   On: 12/21/2020 12:29   DG Chest Port 1 View  Result Date: 12/21/2020 CLINICAL DATA:  Acute respiratory failure EXAM: PORTABLE CHEST 1 VIEW COMPARISON:  Yesterday FINDINGS: Normal heart size and mediastinal contours. Extensive opacification of the lower chest with hazy density. More airspace type ill-defined opacity in the right upper lobe that is new/progressed. No visible pneumothorax. Left PICC with tip at the upper cavoatrial junction. Tracheostomy tube in place. IMPRESSION: 1. Increased airspace disease in the right upper lobe. 2. Hazy opacity at the bases usually from pleural fluid/pulmonary collapse. Electronically Signed   By: Monte Fantasia M.D.   On: 12/21/2020 08:33   DG CHEST PORT 1 VIEW  Result Date: 12/20/2020 CLINICAL DATA:  Respiratory failure EXAM: PORTABLE CHEST 1 VIEW COMPARISON:  December 13, 2020 FINDINGS: A left PICC line is in good position. The tracheostomy tube is stable. No pneumothorax. Opacity in the right base is worsened in the interval. There may be a layering effusion on the right as well. Increased interstitial markings throughout the remainder of the right lung. No focal infiltrate seen on the left. No other abnormalities. IMPRESSION: The findings in the right base may represent layering effusion with underlying atelectasis or developing pneumonia.  Recommend clinical correlation. Electronically Signed   By: Dorise Bullion III M.D   On: 12/20/2020 14:20    Labs:  CBC: Recent Labs    12/20/20 0523 12/20/20 1424 12/21/20 0559 12/21/20 0810 12/22/20 0547  WBC 25.1*  --  20.0* 24.5* 24.1*  HGB 5.6* 8.5* 6.2* 7.7* 7.7*  HCT 18.4* 25.6* 19.1* 24.3* 24.4*  PLT 395  --  281 354 318    COAGS: No results for input(s): INR, APTT in the last 8760 hours.  BMP: Recent Labs    12/27/19 0548 12/31/19 0221 11/29/2020 1830 12/20/20 0523 12/21/20 0559 12/22/20 0547 12/23/20 0435  NA 142 138   < > 133* 135 131* 132*  K 4.3 4.4   < > 3.1* 3.1* 3.1* 3.4*  CL 108 107   < > 103 102 97* 94*  CO2 25 21*   < > 19* 18* 19* 19*  GLUCOSE 98 113*   < > 184* 170* 144* 154*  BUN 22 34*   < > 106* 121* 137* 150*  CALCIUM 9.0 9.0   < > 7.4* 7.7* 8.1* 8.0*  CREATININE 1.02 1.21   < > 1.74* 2.15* 2.08* 2.27*  GFRNONAA >60 59*   < > 41* 32* 33* 30*  GFRAA >60 >60  --   --   --   --   --    < > = values in this interval not displayed.    LIVER FUNCTION TESTS: Recent Labs    12/15/20 0813 12/18/20  3710 12/21/20 0559 12/22/20 0547  BILITOT 1.1 1.1 1.4* 1.1  AST 35 27 27 32  ALT 33 29 24 25   ALKPHOS 116 95 74 102  PROT 6.5 5.8* 5.8* 6.6  ALBUMIN 1.7* 1.5* 1.4* 1.4*    Assessment and Plan:  abd abscess drain intact Will follow Plan per CCS  Electronically Signed: Lavonia Drafts, PA-C 12/23/2020, 12:26 PM   I spent a total of 15 Minutes at the the patient's bedside AND on the patient's hospital floor or unit, greater than 50% of which was counseling/coordinating care for abd abscess drain

## 2020-12-23 NOTE — Progress Notes (Signed)
PHARMACY - TOTAL PARENTERAL NUTRITION CONSULT NOTE  Indication: Small bowel obstruction, prolonged ileus  Patient Measurements: Height: 5\' 11"  (180.3 cm) Weight: 61.2 kg (134 lb 14.7 oz) IBW/kg (Calculated) : 75.3 TPN AdjBW (KG): 60.8 Body mass index is 18.82 kg/m. Usual Weight: 137 lbs  Assessment: 74 YOM presented 12/08/2020 with nausea, vomiting, abdominal pain after leaving AMA at [facility] with known SBO. Patient has been on bowel rest with NG tube decompression. Patient continued to have persistent SBO on imaging with no evidence of improvement, now s/p ex-lap with LoA, SBR and placement of Gtube on 11/25/2020. Pharmacy consulted for TPN management - patient received TPN 11/19/20 through 11/29/20.  Reconsulted to resume TPN on 12/2. TF currently at 30 mL/hr given high ostomy output and concern for tube feeds not absorbing. Patient on loperamide, lomotil, tincture of opium and fiber without improvement in output.   Glucose / Insulin: no hx DM - CBGs <180  - utilized 13 units mSSI/24hrs Electrolytes: Na 131>132 (FW adjusted to 268ml PT q8h), K 3.1>3.4 stable, CO2 improved to 19, phos 4.3 (reduced in TPN yesterday), mag 2.4; CorrCa 9.8, others WNL Renal: Scr back up to 2.27, BUN up to 150 LFTs / TGs: LFTs / TG wnl, Tbili up to 1.4 Prealbumin / albumin: Prealbumin 17.9 > 12.5, albumin 1.4 Intake / Output; MIVF: UOP 1.1 ml/kg/hr, ileostomy output/24h: 1067mL (on fiber, opium tincture, lomotil, loperamide and unlikely to improve per Surgery), drain O/P 20ml, net +19L GI Imaging: 12/11 DG Abd - stomach gas-filled, without distention, no obvious free air 12/23 CT abd pelvis - large loculated collection in low abd/pelvis, few foci of internal gas present, new RUQ ileostomy, thickened distal SB/colon, subtotal atelectatic collapse of LLL, partial collapse of RLL, small R pleural effusion, body wall edema Surgeries / Procedures: 12/12: re-exploration and washout with new copious drainage from incision  concerning for enteric leak vs. enterotomy >> now s/p, bowel necrosis resected 12/15: ex-lap with ileostomy with fistula creation, abdominal closure 12/24 Pelvic abscess drain 12/29 - trach placed  Central access: PICC placed 11/23/2020 TPN start date: 11/19/20>>11/29/20; restart 12/03/20 >>  Nutritional Goals (updated per RD rec 12/30): 2300-2700 kCal, 120-135g protein, >2L fluid per day  Goal TPN rate of 75 cc/hr will provide 120g AA, 432g CHO, and 36g lipids (15% of total kCal due to [employer]) for a total of 2311 kcal, meeting 100% of needs  Current Nutrition:  TPN Vital AF at 20 ml/hr (goal rate 65 ml/hr) - not advancing rate d/t ileostomy output ProSource TF 45 ml TID (each 11g AA and 40 kcal)  Plan:  Continue concentrated TPN at goal rate of 75 ml/hr at 1800. TPN will provide 120g protein, 432g CHO, 36g SMOF lipids (~15% of total kcal due to national shortage), and 2311 kcal - meeting 100% of estimated needs. 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 F/u long-term plans, GOC discussions   Albertina Parr, PharmD., BCPS, BCCCP Clinical Pharmacist Please refer to [facility] for unit-specific pharmacist

## 2020-12-23 NOTE — Progress Notes (Signed)
[facility] Progress Note  27 Days Post-Op  Subjective: Awake on trach collar, but did not do anything for me   Objective: Vital signs in last 24 hours: Temp:  [98.5 F (36.9 C)-99.2 F (37.3 C)] 99.2 F (37.3 C) (01/11 1200) Pulse Rate:  [79-108] 89 (01/11 0900) Resp:  [17-29] 29 (01/11 0900) BP: (105-135)/(50-76) 134/50 (01/11 0900) SpO2:  [91 %-100 %] 96 % (01/11 0900) FiO2 (%):  [30 %] 30 % (01/11 0408) Weight:  [64.1 kg] 64.1 kg (01/11 0301) Last BM Date: 12/22/20  Intake/Output from previous day: 01/10 0701 - 01/11 0700 In: 3591 [I.V.:2158.3; NG/GT:1051.3; IV Piggyback:381.4] Out: 2730 [Urine:1650; Drains:30; Stool:1050] Intake/Output this shift: Total I/O In: 200.1 [I.V.:160.1; NG/GT:40] Out: -   PE: Gen:  Critically ill appearing HEENT: trach in place Card:  RRR Pulm:  on trach collar Abd: soft, NT, open midline woundwith fibrinous exudate and some tissue necrosis but fascia intact/ sutures visible,RUQ ostomy(mucus fistula)viable with scant tan output, LLQ ostomy(end ileostomy)pink and just emptied so no significant output right now (output back down to just over 1L since decreasing TFs) , urostomy productive, g-tubesite c/d with TF running at 20cc/hr, IR drain with scant serosang fluid in bag Neuro: opens eyes briefly but does not respond or follow commands  Skin: warm and dry   Lab Results:  Recent Labs    12/21/20 0810 12/22/20 0547  WBC 24.5* 24.1*  HGB 7.7* 7.7*  HCT 24.3* 24.4*  PLT 354 318   BMET Recent Labs    12/22/20 0547 12/23/20 0435  NA 131* 132*  K 3.1* 3.4*  CL 97* 94*  CO2 19* 19*  GLUCOSE 144* 154*  BUN 137* 150*  CREATININE 2.08* 2.27*  CALCIUM 8.1* 8.0*   PT/INR No results for input(s): LABPROT, INR in the last 72 hours. CMP     Component Value Date/Time   NA 132 (L) 12/23/2020 0435   NA 145 03/08/2016 1356   K 3.4 (L) 12/23/2020 0435   K 4.5 03/08/2016 1356   CL 94 (L) 12/23/2020 0435   CO2 19  (L) 12/23/2020 0435   CO2 28 03/08/2016 1356   GLUCOSE 154 (H) 12/23/2020 0435   GLUCOSE 98 03/08/2016 1356   BUN 150 (H) 12/23/2020 0435   BUN 14.2 03/08/2016 1356   CREATININE 2.27 (H) 12/23/2020 0435   CREATININE 0.9 03/08/2016 1356   CALCIUM 8.0 (L) 12/23/2020 0435   CALCIUM 9.8 03/08/2016 1356   PROT 6.6 12/22/2020 0547   PROT 8.2 03/08/2016 1356   ALBUMIN 1.4 (L) 12/22/2020 0547   ALBUMIN 3.6 03/08/2016 1356   AST 32 12/22/2020 0547   AST 42 (H) 03/08/2016 1356   ALT 25 12/22/2020 0547   ALT 43 03/08/2016 1356   ALKPHOS 102 12/22/2020 0547   ALKPHOS 79 03/08/2016 1356   BILITOT 1.1 12/22/2020 0547   BILITOT 0.74 03/08/2016 1356   GFRNONAA 30 (L) 12/23/2020 0435   GFRAA >60 12/31/2019 0221   Lipase     Component Value Date/Time   LIPASE 57 (H) 12/10/2020 1830       Studies/Results: No results found.  Anti-infectives: Anti-infectives (From admission, onward)   Start     Dose/Rate Route Frequency Ordered Stop   12/21/20 1130  vancomycin (VANCOREADY) IVPB 1250 mg/250 mL  Status:  Discontinued        1,250 mg 166.7 mL/hr over 90 Minutes Intravenous Every 48 hours 12/21/20 1032 12/22/20 0848   12/21/20 1115  piperacillin-tazobactam (ZOSYN) IVPB 3.375 g  3.375 g 12.5 mL/hr over 240 Minutes Intravenous Every 8 hours 12/21/20 1022     12/12/20 1800  Ampicillin-Sulbactam (UNASYN) 3 g in sodium chloride 0.9 % 100 mL IVPB  Status:  Discontinued        3 g 200 mL/hr over 30 Minutes Intravenous Every 6 hours 12/12/20 1101 12/21/20 1022   12/08/20 1300  vancomycin (VANCOCIN) IVPB 1000 mg/200 mL premix  Status:  Discontinued        1,000 mg 200 mL/hr over 60 Minutes Intravenous Every 24 hours 12/08/20 0845 12/12/20 1059   12/08/20 0100  vancomycin (VANCOREADY) IVPB 500 mg/100 mL  Status:  Discontinued        500 mg 100 mL/hr over 60 Minutes Intravenous Every 12 hours 12/07/20 1130 12/07/20 1337   12/07/20 1215  vancomycin (VANCOREADY) IVPB 1250 mg/250 mL         1,250 mg 166.7 mL/hr over 90 Minutes Intravenous  Once 12/07/20 1124 12/07/20 1336   12/06/20 1030  anidulafungin (ERAXIS) 100 mg in sodium chloride 0.9 % 100 mL IVPB  Status:  Discontinued       "Followed by" Linked Group Details   100 mg 78 mL/hr over 100 Minutes Intravenous Every 24 hours 12/05/20 0939 12/13/20 1230   12/05/20 1030  anidulafungin (ERAXIS) 200 mg in sodium chloride 0.9 % 200 mL IVPB       "Followed by" Linked Group Details   200 mg 78 mL/hr over 200 Minutes Intravenous  Once 12/05/20 0939 12/05/20 1618   12/04/20 1500  piperacillin-tazobactam (ZOSYN) IVPB 3.375 g  Status:  Discontinued        3.375 g 12.5 mL/hr over 240 Minutes Intravenous Every 8 hours 12/04/20 0818 12/12/20 1059   12/04/20 0915  piperacillin-tazobactam (ZOSYN) IVPB 3.375 g        3.375 g 100 mL/hr over 30 Minutes Intravenous  Once 12/04/20 0818 12/04/20 1207   12/04/2020 0945  cefoTEtan (CEFOTAN) 2 g in sodium chloride 0.9 % 100 mL IVPB  Status:  Discontinued        2 g 200 mL/hr over 30 Minutes Intravenous To Surgery 12/10/2020 0936 11/20/2020 1057   12/04/2020 0900  cefoTEtan (CEFOTAN) 2 g in sodium chloride 0.9 % 100 mL IVPB  Status:  Discontinued        2 g 200 mL/hr over 30 Minutes Intravenous  Once 11/14/2020 0833 11/30/2020 0936   11/19/20 1130  piperacillin-tazobactam (ZOSYN) IVPB 3.375 g        3.375 g 12.5 mL/hr over 240 Minutes Intravenous Every 8 hours 11/19/20 1017 12/10/2020 2359   11/19/20 1115  fluconazole (DIFLUCAN) IVPB 200 mg        200 mg 100 mL/hr over 60 Minutes Intravenous Every 24 hours 11/19/20 1017 11/21/20 1104   11/19/20 0915  Ampicillin-Sulbactam (UNASYN) 3 g in sodium chloride 0.9 % 100 mL IVPB  Status:  Discontinued        3 g 200 mL/hr over 30 Minutes Intravenous Every 12 hours 11/19/20 0826 11/19/20 0949       Assessment/Plan SBO -s/p ex lap with SBR, placement of a gastrostomy tube12/7 by Dr. Georgette Dover. Takeback 12/12 by Dr. Zenia Resides for necrotic SB with resection of additional  60-70cm of SB inclusive of prior anastomosis in discontinuity with open abdomen.  - S/P ex lap, ileostomy, mucous fistula, closure 12/15 by Dr. Grandville Silos Malnourishment/FEN- Continue TNA. Decrease TF feeds to 20 today given high ileostomy output. Continue fiber, imodium, lomotil, tincture of opium VDRF- s/p tracheostomy 12/29  ID- currently zosyn/vancomycin 1/9>>  unasyn 12/31>>1/9, Merrem 1/11 --> due to new bacteria in tracheal aspirate Intra-abdominal abscess - seen on CT 12/23,s/p perc drain by IR 12/24. Culture with Enterococcus faecalis and Bacteroides fragilis, beta lactamase positive. CT 1/9 showed no evidence of significant residual fluid collection and no new fluid collections. IR drain with 30cc drainage last 24 hours CV/AF RVR- in NSRcurrently  VTE-SQH held for anemia Dispo - ICU. TF to 20cc/hr as above.   Long discussion with family today, daughter of patient and sister of patient.  I frankly told them both that the patient was unable to maintain his nutrition via his gut due to short gut syndrome essentially.  I explained his quality of life is currently very poor and he is not a candidate for long-term TNA.  The daughter seems to understand this and desires to speak to her family and then have another palliative care meeting.  The sister/aunt however seems more hesitant that this is truly the case.  She questioned if more time would help and I explained that his gut is functioning properly but that the issue is how much he has left is not sufficient and that time will not change this.  We discussed comfort care and she was very hesitant in having that type of conversation.  She suggested that she may come into the hospital to see Eusevio to actually lay eyes on him and see for herself how he is really doing.  I relayed that I think that would be a good idea, but that she needs to speak with her family and that I think having another [place] discussion would be a good idea as I do not see  this patient ever getting better and being able to maintain his nutrition to sustain his life.  I have relayed this conversation to Dr. Lynetta Mare and Florentina Jenny.   LOS: 41 days    Henreitta Cea, [facility] 12/23/2020, 1:28 PM Please see Amion for pager number during day hours 7:00am-4:30pm

## 2020-12-23 NOTE — Progress Notes (Addendum)
CPT held at this time due to large blood clots being suctioned from trach. CCM NP made aware

## 2020-12-23 NOTE — Progress Notes (Signed)
Nutrition Follow-up  DOCUMENTATION CODES:   Severe malnutrition in context of chronic illness  INTERVENTION:   TPN at goal providing 100% of nutrition needs  Tube feeding via G-tube: Vital 1.2 at 20 ml/hr 45 ml ProSource TF TID  Continue enteral feedings to promote gut adaptation. TPN helping to meet needs during this time.   200 ml free water every 8 hours  1 packet Nutrisource fiber TID - provides 9 grams soluble fiber per day  NUTRITION DIAGNOSIS:   Severe Malnutrition related to chronic illness as evidenced by severe fat depletion,severe muscle depletion. Ongoing.   GOAL:   Patient will meet greater than or equal to 90% of their needs Meeting with nutrition support   MONITOR:   I & O's  REASON FOR ASSESSMENT:   Consult Enteral/tube feeding initiation and management  ASSESSMENT:   75 year old male with history of cognitive impairment, CVA, HTN, bladder cancer now with urostomy, right-sided glottic carcinoma s/p excision brought to ED with nausea, vomiting and abdominal pain.  Work-up in ED revealed small bowel obstruction.  He also had AKI and lactic acidosis.  Started on bowel rest with NG tube decompression and IV fluid.  General surgery following.  Subsequent x-rays with persistent SBO.  Pt discussed during ICU rounds and with RN.  Palliative care meeting 12/21 plan for full scope of care - MD continues to discuss goals of care Pt weaning to [facility].    12/7 s/p ex lap, extensive LOA, SBR, g-tube placement; s/p bronch  12/10 TPN advanced to goal rate - provides: 2120 kcal and 121 grams protein  12/12 back to OR for re-exploration and washout with new copious drainage from incision; pt s/p reopening of recent laparotomy, SBR without reanastomosis, abd washout, placement of VAC 12/15 s/p ex lap, ileostomy, mucous fistula, and closure 12/18 TPN d/c'ed; TF at goal rate 12/22 TPN initiated to support patient due to short bowel  12/23 opium added  12/24 rt pelvic  abscess drain placed 12/26- TF re-started (trickle) 12/29 s/p trach  12/30 TF increased to 30 ml 1/10 TF decreased to 20 ml  Medications reviewed and include: lomotil 5 ml QID, SSI, imodium 2 mg every 4 hours, morphine, octreotide 50 mg every 6 hr, opium 4 drops every 6 hours  KCl runs  Labs reviewed: Na 132, K+ 3.4, BUN: 150 CBG's: 123-139  I&O's:  Pt is positive 16 L since admission  18 F G-tube --> infusing TF  Ileostomy: 1800 __> 2300 ml -->  1050 ml  Mucous fistula: 0 ml   UOP (urostomy): 1650 ml  R drain: 30 ml   Current TPN @ 75 ml/hr - provides: 2311 kcal and 120 grams protein  Diet Order:   Diet Order            Diet NPO time specified  Diet effective midnight                 EDUCATION NEEDS:   No education needs have been identified at this time  Skin:  Skin Assessment: Skin Integrity Issues: Skin Integrity Issues:: DTI DTI: bilateral heels Stage II: coccyx Incisions: closed abdomen  Last BM:  1050 ml via ileostomy  Height:   Ht Readings from Last 1 Encounters:  11/22/20 5\' 11"  (1.803 m)    Weight:   Wt Readings from Last 1 Encounters:  12/23/20 64.1 kg    Ideal Body Weight:  78.2 kg  BMI:  Body mass index is 19.71 kg/m.  Estimated Nutritional Needs:  Kcal:  2300-2700  Protein:  120-135 grams  Fluid:  > 2 L  Aniruddh Ciavarella P., RD, LDN, CNSC See AMiON for contact information

## 2020-12-23 NOTE — Progress Notes (Addendum)
NAME:  Ronald Wolf, MRN:  [id number], DOB:  01-Dec-1946, LOS: 44 ADMISSION DATE:  11/25/2020, CONSULTATION DATE:  12/7 REFERRING MD:  Cyndia Skeeters, CHIEF COMPLAINT:  Abdominal pain   Brief History:  RonaldWolf is a 75 yo M w/ PMH of CVA, Seizures, Dementia, Bladder ca s/p urostomy, tobacco use, right glottic carcinoma s/p excision presenting with sbo. Found to have ischemic bowel requiring ex-lap 12/7.  Course complicated by septic shock, acute respiratory failure and AKI return to the OR 12/12 for necrotic small bowel resection of additional 70 cm inclusive of prior anastomosis 12/15 abdominal closure with ileostomy He has had a prolonged ICU stay with ventilator dependence, had a tracheostomy on 12/29  Past Medical History:  CVA, Dementia, Bladder CA s/p nephrostomy, tobacco use, right glottic carcinoma s/p excision, seizures.  Significant Hospital Events:  11/25/2020 Admission 12/11/2020 OR 12/12/2020 Admit to ICU 12/10 Brief SVT with associated hypertension. Self limited Given fentanyl, metop and hydral for HTN 12/11 Good urine output with Lasix , failed extubation, reintubated within 2 hours due to inability to clear secretions 12/12 return OR for enteric leak-- found to have bowel necrosis. Left in discontinuity  12/13 self extubated, reintubated. On pressors. Family meeting with PCCM and CCS to discuss possible next steps.  12/15-was back in OR, abdominal closure, RUQ mucus fistula and L ileostomy  12/19: No significant overnight events, high ileostomy output 12/21: scheduled for family meeting. Worse hypernatremia, continued high ileostomy output.  Attempted PSV/CPAP wean, pulled volumes of high 100s -low/mid 200s with tachypnea. Placed back of full support.  12/22 ongoing high output GI/ ileostomy, TPN started; weaned 12/5 all day; off fentanyl gtt, remains on precedex  12/24 IR drain , 1 unit PRBC transfusion for hemoglobin 6.5 12/26 added vancomycin for GPC in blood cultures and RIC suggesting  MRSA 12/29 tracheostomy 12/31 Palliative discussion > family understanding that his situation is critical and he has not made much improvement  1/6 ongoing palliative conversations. No progress from pt standpoint. 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   Consults:  General Surgery PCCM Palliative Care  Procedures:  12/07/2020 Ex-lap, G-tube placement 12/7 PICC >> ETT 12/7 >> 12/11, >> 12/15- back to OR for abdominal closure, exploratory laparotomy  12/24 CT-guided right pelvic abscess drain 12/29 trach  Significant Diagnostic Tests:  CT abd/pelvis 12/2 > High-grade distal small bowel obstruction, possibly due to adhesion. Mild ascites and diffuse mesenteric edema. No evidence of focal inflammatory process or abscess. Small bilateral pleural effusions and mild right lower lobe Atelectasis. CT abdomen/pelvis 12/23 Interval development of a large loculated collection in the low anterior abdomen and pelvis measuring approximately 6.1 x 11.3 x12.1 cm in size subjacent to the operative site. Few foci of internal gas are present. , no frank spillage of enteric contrast   CT H 1/6 > no acute intracranial abnormality. Chronic L MCA changes from prior L MCA infarct. Atrophic and chronic white matter ischemic changes.  Stable L basal ganglia infarcts  CT abd pelvis 1/10: pelvis abscess drained. BLL effusions w/ atx. Expected post op changes  Micro Data:  11/17/2020 COVID/Flu negative 12/7 BA: Few GNR rare GPC -- predominantly PMN  12/7 BCx> Neg  12/11 respiratory -no organism in tracheal aspirate 12/24 BC >> RIC showing both staph epidermis 1/4 12/24 abscess >> E. Faecalis, B  fragilis (B lactamase) 12/27 trach aspirate>> normal flora  Antimicrobials:  Fluc 12/8>> 12/10 Zosyn 12/8> 12/12 cefotetain 12/15>> off Zosyn 12/23 >> 12/30 eraxis 12/24 >> 1/2 Vancomycin 12/26 >>12/31 Unasyn 12/31 >1/9 zosyn 1/9>> vanc 1/9 >>off  Interim History / Subjective:   No sig change   Objective   Blood pressure (Abnormal) 134/50, pulse 89, temperature 98.5 F (36.9 C), temperature source Axillary, resp. rate (Abnormal) 29, height 5' 11"  (1.803 m), weight 64.1 kg, SpO2 96 %.    Vent Mode: PRVC FiO2 (%):  [30 %] 30 % Set Rate:  [14 bmp] 14 bmp Vt Set:  [600 mL] 600 mL PEEP:  [5 cmH20] 5 cmH20 Plateau Pressure:  [24 cmH20-26 cmH20] 25 cmH20   Intake/Output Summary (Last 24 hours) at 12/23/2020 0944 Last data filed at 12/23/2020 0934 Gross per 24 hour  Intake 3536.05 ml  Output 2730 ml  Net 806.05 ml   Filed Weights   12/18/20 0411 12/19/20 0431 12/23/20 0301  Weight: 64.1 kg 64.1 kg 64.1 kg    Examination: General this is a frail 75 year old male w/ critical illness FTT HENT temporal wasting trach unremarkable. Still having some bloody tracheal secretions Pulm some scattered rhonchi. No accessory use on ATC Card RRR abd Right sided mucous fistula, mid abd dressing CD&I, LUQ G tube, LLQ ileostomy tube still w/ sig output, IR drain still draining  Ext warm and dry, BUE pitting edema Neuro awake but not interactive or purposeful     Resolved Hospital Problem list   HCAP  Assessment & Plan:   Ischemic bowel perf s/p ex-lap x 2 for resection, mucus fistula ileostomy, G tube placement c/b Intra-abdominal abscess Plan Zosyn to continue while has drain   Severe protein calorie malnutrition High ileostomy output, enteral malabsorption -->short gut syndrome  F/U CT abd/pelvis w/ what looks like resolution of pelvic access -CCS following Plan Cont trophic feeds Cont TPN Cont imodium, EN morphine   Trach/Vent dependence  Bilateral pleural effusions due  to malnutrition  Trach bleeding due to suction trauma Plan Cont atc as tolerated Routine trach care pulm hygiene  Pulse ox   ecoli and achromobacter xylosoxidans in sputum -suspect the xylosoxidans is colonization Plan Trend wbc Zosyn covers his ecoli   AKI due to intravascular volume contraction from third space losses, hypoalbuminemia, Hypokalemia, Hyponatremia, Anion gap metabolic acidosis  Plan Hold lasix for today  Replace K  Renal dose meds Free water limited  Am chem  Acute encephalopathy, multifactorial Prior L MCA CVA -New R sided facial droop/ flaccid RUE tone 1/6, CT head negative Plan Cont supportive care Holding plavix given anemia    Hypertension, uncontrolled Tachybradycardia syndrome Plan Avoid BB Cont hydralazine 76m q 8  Atrial fibrillation with RVR> resolved -Not on anticoagulation because of recent surgery Plan Tele   Seizures well controlled Plan keppra   Bladder Cancer Plan Urostomy care   Anemia, no obvious source of ongoing blood loss, hemoglobin dropped from 7.2-5.6 >> improved to 8.5 with 2 units PRBC 1/8 Repeat hemoglobin 1/9 again dropped to 6.2>> 7.7 on repeat Plan Cont to trend   Goals of Care Ongoing GOC convos (many interdisciplinary meetings, now being followed by palliative care) Poor prognosis has been communicated to family by several providers, I did this again 1/7 & 1/8 to SErmalinda Memosand brother at bedside.   Family seems  to be having hard time accepting poor prognosis.      Daily Goals Checklist  Pain/Anxiety/Delirium protocol (if indicated): enteral morphine only.  VAP protocol (if indicated): bundle in place Respiratory support goals: trach collar trials today.  Blood pressure target: SBP<160 DVT prophylaxis:  subcutaneous heparin Nutrition Status: severe malnutrition, continue TPN and try elemental feeds GI prophylaxis: pantoprazole Fluid status goals: euvolemia 1/11 Urinary catheter: Guide hemodynamic  management Central lines: PICC Glucose control: sliding scale Mobility/therapy needs: bedrest Antibiotic de-escalation: Zosyn. Home medication reconciliation: on hold  Daily labs: CBC, BMP Code Status: No CPR but continue aggressive care otherwise  Family Communication: Last updated 1/8 Disposition: ICU  Goals of Care:  Last date of multidisciplinary goals of care discussion: 18/4 Family and staff present: Patient's daughter Ms. Alveta Heimlich Summary of discussion:  1/5- Palliative care met with family - -desired for ongoing aggressive medical care as offered, limited code blue Code Status: limited code blue no shock, no CPR  . Erick Colace ACNP-BC [place] Pager # [phone] OR # [phone] if no answer    12/23/2020, 9:44 AM

## 2020-12-24 DIAGNOSIS — N179 Acute kidney failure, unspecified: Secondary | ICD-10-CM | POA: Diagnosis not present

## 2020-12-24 DIAGNOSIS — J9601 Acute respiratory failure with hypoxia: Secondary | ICD-10-CM | POA: Diagnosis not present

## 2020-12-24 DIAGNOSIS — E43 Unspecified severe protein-calorie malnutrition: Secondary | ICD-10-CM | POA: Diagnosis not present

## 2020-12-24 DIAGNOSIS — K651 Peritoneal abscess: Secondary | ICD-10-CM | POA: Diagnosis not present

## 2020-12-24 LAB — BASIC METABOLIC PANEL
Anion gap: 17 — ABNORMAL HIGH (ref 5–15)
BUN: 153 mg/dL — ABNORMAL HIGH (ref 8–23)
CO2: 20 mmol/L — ABNORMAL LOW (ref 22–32)
Calcium: 8.2 mg/dL — ABNORMAL LOW (ref 8.9–10.3)
Chloride: 96 mmol/L — ABNORMAL LOW (ref 98–111)
Creatinine, Ser: 2.14 mg/dL — ABNORMAL HIGH (ref 0.61–1.24)
GFR, Estimated: 32 mL/min — ABNORMAL LOW (ref >60)
Glucose, Bld: 193 mg/dL — ABNORMAL HIGH (ref 70–99)
Potassium: 3.6 mmol/L (ref 3.5–5.1)
Sodium: 133 mmol/L — ABNORMAL LOW (ref 135–145)

## 2020-12-24 LAB — CULTURE, RESPIRATORY W GRAM STAIN

## 2020-12-24 LAB — GLUCOSE, CAPILLARY
Glucose-Capillary: 125 mg/dL — ABNORMAL HIGH (ref 70–99)
Glucose-Capillary: 157 mg/dL — ABNORMAL HIGH (ref 70–99)
Glucose-Capillary: 158 mg/dL — ABNORMAL HIGH (ref 70–99)
Glucose-Capillary: 169 mg/dL — ABNORMAL HIGH (ref 70–99)
Glucose-Capillary: 192 mg/dL — ABNORMAL HIGH (ref 70–99)
Glucose-Capillary: 197 mg/dL — ABNORMAL HIGH (ref 70–99)

## 2020-12-24 LAB — MAGNESIUM: Magnesium: 2.3 mg/dL (ref 1.7–2.4)

## 2020-12-24 LAB — PHOSPHORUS: Phosphorus: 4.1 mg/dL (ref 2.5–4.6)

## 2020-12-24 MED ORDER — OCTREOTIDE ACETATE 50 MCG/ML IJ SOLN
100.0000 ug | Freq: Two times a day (BID) | INTRAMUSCULAR | Status: DC
  Administered 2020-12-24 – 2021-01-02 (×18): 100 ug via SUBCUTANEOUS
  Filled 2020-12-24 (×20): qty 2

## 2020-12-24 MED ORDER — TRACE MINERALS CU-MN-SE-ZN 300-55-60-3000 MCG/ML IV SOLN
INTRAVENOUS | Status: AC
  Filled 2020-12-24: qty 804

## 2020-12-24 MED ORDER — FUROSEMIDE 10 MG/ML IJ SOLN
40.0000 mg | Freq: Once | INTRAMUSCULAR | Status: AC
  Administered 2020-12-24: 40 mg via INTRAVENOUS
  Filled 2020-12-24: qty 4

## 2020-12-24 MED ORDER — ALBUMIN HUMAN 25 % IV SOLN
25.0000 g | Freq: Once | INTRAVENOUS | Status: AC
  Administered 2020-12-24: 25 g via INTRAVENOUS
  Filled 2020-12-24: qty 100

## 2020-12-24 NOTE — Progress Notes (Signed)
Pt placed back on full vent support due to sats dropping into the low 80's. RN aware.

## 2020-12-24 NOTE — Progress Notes (Signed)
[facility] Progress Note  28 Days Post-Op  Subjective: CC-  No acute events over night.  Awake, tracks eyes some but does not follow commands.  Objective: Vital signs in last 24 hours: Temp:  [97.6 F (36.4 C)-99.5 F (37.5 C)] 97.6 F (36.4 C) (01/12 0800) Pulse Rate:  [52-97] 81 (01/12 0750) Resp:  [16-29] 24 (01/12 0750) BP: (118-165)/(50-78) 147/66 (01/12 0600) SpO2:  [93 %-100 %] 94 % (01/12 0750) FiO2 (%):  [28 %-40 %] 40 % (01/12 0750) Last BM Date: 12/23/20  Intake/Output from previous day: 01/11 0701 - 01/12 0700 In: 2481.7 [I.V.:1730.8; NG/GT:400; IV Piggyback:346] Out: 4098 [Urine:2200; Drains:25; Stool:1250] Intake/Output this shift: No intake/output data recorded.  PE: Gen: Critically ill appearing HEENT: trach in place Card: RRR Pulm:mechanically ventilated JXB:JYNW, NT,openmidline woundwith fibrinous exudate and some tissue necrosis but fascia intact/ sutures visible,RUQ ostomy(mucus fistula)viable with scant tan output, LLQ ostomy(end ileostomy)pink with TF-appearing output, urostomy productive, g-tubesite c/dwith TF running at 20cc/hr, IR drain with scant serous fluid in bag Neuro: opens eyes briefly but does not respond or follow commands  Skin: warm and dry   Lab Results:  Recent Labs    12/22/20 0547  WBC 24.1*  HGB 7.7*  HCT 24.4*  PLT 318   BMET Recent Labs    12/23/20 0435 12/24/20 0445  NA 132* 133*  K 3.4* 3.6  CL 94* 96*  CO2 19* 20*  GLUCOSE 154* 193*  BUN 150* 153*  CREATININE 2.27* 2.14*  CALCIUM 8.0* 8.2*   PT/INR No results for input(s): LABPROT, INR in the last 72 hours. CMP     Component Value Date/Time   NA 133 (L) 12/24/2020 0445   NA 145 03/08/2016 1356   K 3.6 12/24/2020 0445   K 4.5 03/08/2016 1356   CL 96 (L) 12/24/2020 0445   CO2 20 (L) 12/24/2020 0445   CO2 28 03/08/2016 1356   GLUCOSE 193 (H) 12/24/2020 0445   GLUCOSE 98 03/08/2016 1356   BUN 153 (H) 12/24/2020 0445   BUN  14.2 03/08/2016 1356   CREATININE 2.14 (H) 12/24/2020 0445   CREATININE 0.9 03/08/2016 1356   CALCIUM 8.2 (L) 12/24/2020 0445   CALCIUM 9.8 03/08/2016 1356   PROT 6.6 12/22/2020 0547   PROT 8.2 03/08/2016 1356   ALBUMIN 1.4 (L) 12/22/2020 0547   ALBUMIN 3.6 03/08/2016 1356   AST 32 12/22/2020 0547   AST 42 (H) 03/08/2016 1356   ALT 25 12/22/2020 0547   ALT 43 03/08/2016 1356   ALKPHOS 102 12/22/2020 0547   ALKPHOS 79 03/08/2016 1356   BILITOT 1.1 12/22/2020 0547   BILITOT 0.74 03/08/2016 1356   GFRNONAA 32 (L) 12/24/2020 0445   GFRAA >60 12/31/2019 0221   Lipase     Component Value Date/Time   LIPASE 57 (H) 12/08/2020 1830       Studies/Results: No results found.  Anti-infectives: Anti-infectives (From admission, onward)   Start     Dose/Rate Route Frequency Ordered Stop   12/21/20 1130  vancomycin (VANCOREADY) IVPB 1250 mg/250 mL  Status:  Discontinued        1,250 mg 166.7 mL/hr over 90 Minutes Intravenous Every 48 hours 12/21/20 1032 12/22/20 0848   12/21/20 1115  piperacillin-tazobactam (ZOSYN) IVPB 3.375 g        3.375 g 12.5 mL/hr over 240 Minutes Intravenous Every 8 hours 12/21/20 1022     12/12/20 1800  Ampicillin-Sulbactam (UNASYN) 3 g in sodium chloride 0.9 % 100 mL IVPB  Status:  Discontinued        3 g 200 mL/hr over 30 Minutes Intravenous Every 6 hours 12/12/20 1101 12/21/20 1022   12/08/20 1300  vancomycin (VANCOCIN) IVPB 1000 mg/200 mL premix  Status:  Discontinued        1,000 mg 200 mL/hr over 60 Minutes Intravenous Every 24 hours 12/08/20 0845 12/12/20 1059   12/08/20 0100  vancomycin (VANCOREADY) IVPB 500 mg/100 mL  Status:  Discontinued        500 mg 100 mL/hr over 60 Minutes Intravenous Every 12 hours 12/07/20 1130 12/07/20 1337   12/07/20 1215  vancomycin (VANCOREADY) IVPB 1250 mg/250 mL        1,250 mg 166.7 mL/hr over 90 Minutes Intravenous  Once 12/07/20 1124 12/07/20 1336   12/06/20 1030  anidulafungin (ERAXIS) 100 mg in sodium chloride  0.9 % 100 mL IVPB  Status:  Discontinued       "Followed by" Linked Group Details   100 mg 78 mL/hr over 100 Minutes Intravenous Every 24 hours 12/05/20 0939 12/13/20 1230   12/05/20 1030  anidulafungin (ERAXIS) 200 mg in sodium chloride 0.9 % 200 mL IVPB       "Followed by" Linked Group Details   200 mg 78 mL/hr over 200 Minutes Intravenous  Once 12/05/20 0939 12/05/20 1618   12/04/20 1500  piperacillin-tazobactam (ZOSYN) IVPB 3.375 g  Status:  Discontinued        3.375 g 12.5 mL/hr over 240 Minutes Intravenous Every 8 hours 12/04/20 0818 12/12/20 1059   12/04/20 0915  piperacillin-tazobactam (ZOSYN) IVPB 3.375 g        3.375 g 100 mL/hr over 30 Minutes Intravenous  Once 12/04/20 0818 12/04/20 1207   12/03/2020 0945  cefoTEtan (CEFOTAN) 2 g in sodium chloride 0.9 % 100 mL IVPB  Status:  Discontinued        2 g 200 mL/hr over 30 Minutes Intravenous To Surgery 12/06/2020 0936 11/17/2020 1057   12/05/2020 0900  cefoTEtan (CEFOTAN) 2 g in sodium chloride 0.9 % 100 mL IVPB  Status:  Discontinued        2 g 200 mL/hr over 30 Minutes Intravenous  Once 11/23/2020 0833 12/04/2020 0936   11/19/20 1130  piperacillin-tazobactam (ZOSYN) IVPB 3.375 g        3.375 g 12.5 mL/hr over 240 Minutes Intravenous Every 8 hours 11/19/20 1017 12/07/2020 2359   11/19/20 1115  fluconazole (DIFLUCAN) IVPB 200 mg        200 mg 100 mL/hr over 60 Minutes Intravenous Every 24 hours 11/19/20 1017 11/21/20 1104   11/19/20 0915  Ampicillin-Sulbactam (UNASYN) 3 g in sodium chloride 0.9 % 100 mL IVPB  Status:  Discontinued        3 g 200 mL/hr over 30 Minutes Intravenous Every 12 hours 11/19/20 0826 11/19/20 0949       Assessment/Plan SBO -s/p ex lap with SBR, placement of a gastrostomy tube12/7 by Dr. Georgette Dover. Takeback 12/12 by Dr. Zenia Resides for necrotic SB with resection of additional 60-70cm of SB inclusive of prior anastomosis in discontinuity with open abdomen. - S/P ex lap, ileostomy, mucous fistula, closure 12/15 by Dr.  [place] Silos Malnourishment/FEN-Continue TNA. Keep TF feeds at 20 given high ileostomy output. Continue fiber, imodium, lomotil, tincture of opium VDRF- s/p tracheostomy 12/29 ID- currently zosyn 1/9>> (for E coli and Achromobacter xylosoxidans in Rcx). vancomycin 1/9>>1/10. unasyn 12/31>>1/9 Intra-abdominal abscess - seen on CT 12/23,s/p perc drain by IR 12/24. Culture with Enterococcus faecalis and Bacteroides fragilis, beta lactamase positive.  CT 1/9 showed no evidence of significant residual fluid collection and no new fluid collections. IR drain with 25cc drainage last 24 hours (some of this is from flushing the drain). Ok to d/c IR drain from surgical standpoint CV/AF RVR- in NSRcurrently  VTE-SQH  Dispo - ICU. TNA and TF at 20cc/hr as above. Ok to d/c IR drain from surgical standpoint, this was discussed with IR. Continue [place] discussions.   LOS: 42 days    [facility] 12/24/2020, 8:48 AM Please see Amion for pager number during day hours 7:00am-4:30pm

## 2020-12-24 NOTE — Consult Note (Signed)
Urology Consult  Referring physician: B Meuth Reason for referral: infected prostheses  Chief Complaint: possible infected prosthesis  History of Present Illness:  Mr.Bidinger is a 75 yo M w/ PMH of CVA, Seizures, Dementia, Bladder ca s/p urostomy, tobacco use, right glottic carcinoma s/p excision presenting with sbo. Found to have ischemic bowel requiring ex-lap12/7. Course complicated by septic shock, acute respiratory failure and AKI return to the OR 12/12 for necrotic small bowel resection of additional 70 cm inclusive of prior anastomosis 12/15 abdominal closure with ileostomy He has had a prolonged ICU stay with ventilator dependence, had a tracheostomy on 12/29  Recent iliostomy with open wound and mucous fistula  Had Dynaflex penile prosthesis 1990 - single piece inflatable with 4 1/2 cm rear tip extenders (read OR note) Has ileioconduit as well for bladder cancer draining well  Family/physicians discussing palliative care issues       Past Medical History:  Diagnosis Date  . Abdominal pain, other specified site 09/26/2013  . Anxiety   . Arthritis   . Atherosclerosis of arteries 05/13/2013  . Cough 09/26/2013  . Depression   . Eating disorder   . GERD (gastroesophageal reflux disease)   . Hepatitis C   . Hypertension   . Lesion of liver 04/01/2013  . Loss of weight 06/21/2014  . Medial knee pain 09/26/2013  . Palpitations 06/21/2014  . Shortness of breath dyspnea    with exertion   Past Surgical History:  Procedure Laterality Date  . BOWEL RESECTION  12/06/2020   Procedure: SMALL BOWEL RESECTION;  Surgeon: Donnie Mesa, MD;  Location: [facility];  Service: General;;  . GASTROSTOMY Left 11/19/2020   Procedure: INSERTION OF GASTROSTOMY TUBE;  Surgeon: Donnie Mesa, MD;  Location: [facility];  Service: General;  Laterality: Left;  . ILEOSTOMY N/A 11/19/2020   Procedure: ILEOSTOMY WITH MUCOUS FISTULA CREATION;  Surgeon: Georganna Skeans, MD;  Location: [facility];  Service: General;   Laterality: N/A;  . IR ANGIO INTRA EXTRACRAN SEL COM CAROTID INNOMINATE BILAT MOD SED  09/09/2017  . IR ANGIO VERTEBRAL SEL VERTEBRAL BILAT MOD SED  09/09/2017  . IR RADIOLOGIST EVAL & MGMT  09/07/2017  . LAPAROTOMY N/A 11/14/2020   Procedure: EXPLORATORY LAPAROTOMY;  Surgeon: Donnie Mesa, MD;  Location: [facility];  Service: General;  Laterality: N/A;  . LAPAROTOMY N/A 12/03/2020   Procedure: EXPLORATORY LAPAROTOMY SMALL BOWEL RESECTION WITHOUT ANASTOMOSIS WITH APPLICATION OF WOUND VAC;  Surgeon: Dwan Bolt, MD;  Location: [facility];  Service: General;  Laterality: N/A;  . LAPAROTOMY N/A 12/08/2020   Procedure: EXPLORATORY LAPAROTOMY AND ABDOMINAL CLOSURE;  Surgeon: Georganna Skeans, MD;  Location: [facility];  Service: General;  Laterality: N/A;  . LYSIS OF ADHESION  12/06/2020   Procedure: LYSIS OF ADHESIONS;  Surgeon: Donnie Mesa, MD;  Location: [facility];  Service: General;;  . MICROLARYNGOSCOPY WITH CO2 LASER AND EXCISION OF VOCAL CORD LESION N/A 05/14/2016   Procedure: MICROLARYNGOSCOPY WITH CO2 LASER AND EXCISION OF VOCAL CORD LESION;  Surgeon: Melida Quitter, MD;  Location: [facility];  Service: ENT;  Laterality: N/A;  Suspended micro laryngoscopy with CO2 laser excision of vocal fold lesion  . TUMOR REMOVAL  1990   bladder    Medications: I have reviewed the patient's current medications. Allergies:  Allergies  Allergen Reactions  . Iohexol Hives     Desc: pt broke out in hives years ago from iv contrast. he was given 50mg  benadryl po, 1 hr prior to ct and did fine today w/o complications.  JB     Family History  Problem Relation Age of Onset  . Diabetes Mother   . Arthritis Mother   . Healthy Sister        x 2  . Cancer Brother        stomach  . Dementia Father   . Prostate cancer Neg Hx   . Colon cancer Neg Hx   . Breast cancer Neg Hx   . Heart disease Neg Hx   . Hypertension Neg Hx    Social History:  reports that he has been smoking cigarettes. He has a 120.00 pack-year smoking  history. He has never used smokeless tobacco. He reports current alcohol use. He reports current drug use. Drug: Marijuana.  ROS: All systems are reviewed and negative except as noted. Rest negative  Physical Exam:  Vital signs in last 24 hours: Temp:  [97.6 F (36.4 C)-99.5 F (37.5 C)] 98.3 F (36.8 C) (01/12 1600) Pulse Rate:  [79-102] 97 (01/12 1509) Resp:  [14-30] 25 (01/12 1509) BP: (118-188)/(56-80) 122/56 (01/12 1300) SpO2:  [77 %-100 %] 100 % (01/12 1509) FiO2 (%):  [30 %-50 %] 30 % (01/12 1509)  Cardiovascular: Skin warm; not flushed Respiratory: Breaths quiet; no shortness of breath Abdomen: No masses Neurological: Normal sensation to touch Musculoskeletal: Normal motor function arms and legs Lymphatics: No inguinal adenopathy Skin: No rashes Genitourinary:severe penile edema and scrotal edema and moderate improving leg edema NO infection  Laboratory Data:  Results for orders placed or performed during the hospital encounter of 11/18/2020 (from the past 72 hour(s))  Glucose, capillary     Status: Abnormal   Collection Time: 12/21/20  7:48 PM  Result Value Ref Range   Glucose-Capillary 119 (H) 70 - 99 mg/dL    Comment: Glucose reference range applies only to samples taken after fasting for at least 8 hours.  Glucose, capillary     Status: Abnormal   Collection Time: 12/21/20 11:56 PM  Result Value Ref Range   Glucose-Capillary 147 (H) 70 - 99 mg/dL    Comment: Glucose reference range applies only to samples taken after fasting for at least 8 hours.  Glucose, capillary     Status: Abnormal   Collection Time: 12/22/20  3:41 AM  Result Value Ref Range   Glucose-Capillary 143 (H) 70 - 99 mg/dL    Comment: Glucose reference range applies only to samples taken after fasting for at least 8 hours.  Comprehensive metabolic panel     Status: Abnormal   Collection Time: 12/22/20  5:47 AM  Result Value Ref Range   Sodium 131 (L) 135 - 145 mmol/L   Potassium 3.1 (L) 3.5 -  5.1 mmol/L   Chloride 97 (L) 98 - 111 mmol/L   CO2 19 (L) 22 - 32 mmol/L   Glucose, Bld 144 (H) 70 - 99 mg/dL    Comment: Glucose reference range applies only to samples taken after fasting for at least 8 hours.   BUN 137 (H) 8 - 23 mg/dL   Creatinine, Ser 2.08 (H) 0.61 - 1.24 mg/dL   Calcium 8.1 (L) 8.9 - 10.3 mg/dL   Total Protein 6.6 6.5 - 8.1 g/dL   Albumin 1.4 (L) 3.5 - 5.0 g/dL   AST 32 15 - 41 U/L   ALT 25 0 - 44 U/L   Alkaline Phosphatase 102 38 - 126 U/L   Total Bilirubin 1.1 0.3 - 1.2 mg/dL   GFR, Estimated 33 (L) >60 mL/min    Comment: (NOTE)  Calculated using the CKD-EPI Creatinine Equation (2021)    Anion gap 15 5 - 15    Comment: Performed at [facility], [place] [address] [place], [place] [address]  Magnesium     Status: Abnormal   Collection Time: 12/22/20  5:47 AM  Result Value Ref Range   Magnesium 2.8 (H) 1.7 - 2.4 mg/dL    Comment: Performed at [facility] [address], [place], [place] [address]  Phosphorus     Status: None   Collection Time: 12/22/20  5:47 AM  Result Value Ref Range   Phosphorus 4.5 2.5 - 4.6 mg/dL    Comment: Performed at [facility] [address], [place], [place] [address]  CBC     Status: Abnormal   Collection Time: 12/22/20  5:47 AM  Result Value Ref Range   WBC 24.1 (H) 4.0 - 10.5 K/uL   RBC 2.56 (L) 4.22 - 5.81 MIL/uL   Hemoglobin 7.7 (L) 13.0 - 17.0 g/dL   HCT 24.4 (L) 39.0 - 52.0 %   MCV 95.3 80.0 - 100.0 fL   MCH 30.1 26.0 - 34.0 pg   MCHC 31.6 30.0 - 36.0 g/dL   RDW 20.4 (H) 11.5 - 15.5 %   Platelets 318 150 - 400 K/uL   nRBC 1.8 (H) 0.0 - 0.2 %    Comment: Performed at [facility] [address], [place], [place] [address]  Differential     Status: Abnormal   Collection Time: 12/22/20  5:47 AM  Result Value Ref Range   Neutrophils Relative % 87 %   Neutro Abs 21.0 (H) 1.7 - 7.7 K/uL   Lymphocytes Relative 4 %   Lymphs Abs 1.0 0.7 - 4.0 K/uL   Monocytes Relative 8 %   Monocytes  Absolute 1.9 (H) 0.1 - 1.0 K/uL   Eosinophils Relative 1 %   Eosinophils Absolute 0.2 0.0 - 0.5 K/uL   Basophils Relative 0 %   Basophils Absolute 0.0 0.0 - 0.1 K/uL   WBC Morphology See Note     Comment: Mild Left Shift. 1 to 5% Metas and Myelos, Occ Pro Noted.   nRBC 0 0 /100 WBC   Abs Immature Granulocytes 0.00 0.00 - 0.07 K/uL   Polychromasia PRESENT     Comment: Performed at [facility], [place] [address], [place], [place] [address]  Triglycerides     Status: None   Collection Time: 12/22/20  5:47 AM  Result Value Ref Range   Triglycerides 68 <150 mg/dL    Comment: Performed at [facility] [address] [place], [place] [address]  Prealbumin     Status: Abnormal   Collection Time: 12/22/20  5:47 AM  Result Value Ref Range   Prealbumin 12.5 (L) 18 - 38 mg/dL    Comment: Performed at [facility] [address], [place], [place] [address]  Glucose, capillary     Status: Abnormal   Collection Time: 12/22/20  8:12 AM  Result Value Ref Range   Glucose-Capillary 180 (H) 70 - 99 mg/dL    Comment: Glucose reference range applies only to samples taken after fasting for at least 8 hours.  Glucose, capillary     Status: Abnormal   Collection Time: 12/22/20 12:01 PM  Result Value Ref Range   Glucose-Capillary 139 (H) 70 - 99 mg/dL    Comment: Glucose reference range applies only to samples taken after fasting for at least  8 hours.  Glucose, capillary     Status: Abnormal   Collection Time: 12/22/20  3:51 PM  Result Value Ref Range   Glucose-Capillary 144 (H) 70 - 99 mg/dL    Comment: Glucose reference range applies only to samples taken after fasting for at least 8 hours.  Glucose, capillary     Status: Abnormal   Collection Time: 12/22/20  7:41 PM  Result Value Ref Range   Glucose-Capillary 123 (H) 70 - 99 mg/dL    Comment: Glucose reference range applies only to samples taken after fasting for at least 8 hours.  Glucose, capillary     Status: Abnormal    Collection Time: 12/22/20 11:41 PM  Result Value Ref Range   Glucose-Capillary 139 (H) 70 - 99 mg/dL    Comment: Glucose reference range applies only to samples taken after fasting for at least 8 hours.  Glucose, capillary     Status: Abnormal   Collection Time: 12/23/20  4:01 AM  Result Value Ref Range   Glucose-Capillary 131 (H) 70 - 99 mg/dL    Comment: Glucose reference range applies only to samples taken after fasting for at least 8 hours.  Basic metabolic panel     Status: Abnormal   Collection Time: 12/23/20  4:35 AM  Result Value Ref Range   Sodium 132 (L) 135 - 145 mmol/L   Potassium 3.4 (L) 3.5 - 5.1 mmol/L   Chloride 94 (L) 98 - 111 mmol/L   CO2 19 (L) 22 - 32 mmol/L   Glucose, Bld 154 (H) 70 - 99 mg/dL    Comment: Glucose reference range applies only to samples taken after fasting for at least 8 hours.   BUN 150 (H) 8 - 23 mg/dL   Creatinine, Ser 2.27 (H) 0.61 - 1.24 mg/dL   Calcium 8.0 (L) 8.9 - 10.3 mg/dL   GFR, Estimated 30 (L) >60 mL/min    Comment: (NOTE) Calculated using the CKD-EPI Creatinine Equation (2021)    Anion gap 19 (H) 5 - 15    Comment: Performed at [facility] [address] [place], [place] [address]  Magnesium     Status: None   Collection Time: 12/23/20  4:35 AM  Result Value Ref Range   Magnesium 2.4 1.7 - 2.4 mg/dL    Comment: Performed at [facility] [address] [place], [place] [address]  Phosphorus     Status: None   Collection Time: 12/23/20  4:35 AM  Result Value Ref Range   Phosphorus 4.3 2.5 - 4.6 mg/dL    Comment: Performed at [facility] [address] [place], [place] [address]  Glucose, capillary     Status: Abnormal   Collection Time: 12/23/20  8:06 AM  Result Value Ref Range   Glucose-Capillary 136 (H) 70 - 99 mg/dL    Comment: Glucose reference range applies only to samples taken after fasting for at least 8 hours.  Glucose, capillary     Status: Abnormal   Collection Time: 12/23/20 12:16 PM   Result Value Ref Range   Glucose-Capillary 142 (H) 70 - 99 mg/dL    Comment: Glucose reference range applies only to samples taken after fasting for at least 8 hours.  Glucose, capillary     Status: Abnormal   Collection Time: 12/23/20  4:14 PM  Result Value Ref Range   Glucose-Capillary 214 (H) 70 - 99 mg/dL    Comment: Glucose reference range applies only to samples taken  after fasting for at least 8 hours.  Glucose, capillary     Status: Abnormal   Collection Time: 12/23/20  7:45 PM  Result Value Ref Range   Glucose-Capillary 157 (H) 70 - 99 mg/dL    Comment: Glucose reference range applies only to samples taken after fasting for at least 8 hours.  Glucose, capillary     Status: Abnormal   Collection Time: 12/23/20 11:28 PM  Result Value Ref Range   Glucose-Capillary 145 (H) 70 - 99 mg/dL    Comment: Glucose reference range applies only to samples taken after fasting for at least 8 hours.  Glucose, capillary     Status: Abnormal   Collection Time: 12/24/20  3:30 AM  Result Value Ref Range   Glucose-Capillary 169 (H) 70 - 99 mg/dL    Comment: Glucose reference range applies only to samples taken after fasting for at least 8 hours.  Basic metabolic panel     Status: Abnormal   Collection Time: 12/24/20  4:45 AM  Result Value Ref Range   Sodium 133 (L) 135 - 145 mmol/L   Potassium 3.6 3.5 - 5.1 mmol/L   Chloride 96 (L) 98 - 111 mmol/L   CO2 20 (L) 22 - 32 mmol/L   Glucose, Bld 193 (H) 70 - 99 mg/dL    Comment: Glucose reference range applies only to samples taken after fasting for at least 8 hours.   BUN 153 (H) 8 - 23 mg/dL   Creatinine, Ser 2.14 (H) 0.61 - 1.24 mg/dL   Calcium 8.2 (L) 8.9 - 10.3 mg/dL   GFR, Estimated 32 (L) >60 mL/min    Comment: (NOTE) Calculated using the CKD-EPI Creatinine Equation (2021)    Anion gap 17 (H) 5 - 15    Comment: Performed at [facility] [address], [place], [place] [address]  Magnesium     Status: None   Collection Time:  12/24/20  4:45 AM  Result Value Ref Range   Magnesium 2.3 1.7 - 2.4 mg/dL    Comment: Performed at [facility] [address], [place],  [address]  Phosphorus     Status: None   Collection Time: 12/24/20  4:45 AM  Result Value Ref Range   Phosphorus 4.1 2.5 - 4.6 mg/dL    Comment: Performed at [facility] [address], [place], [place] [address]  Glucose, capillary     Status: Abnormal   Collection Time: 12/24/20  8:12 AM  Result Value Ref Range   Glucose-Capillary 197 (H) 70 - 99 mg/dL    Comment: Glucose reference range applies only to samples taken after fasting for at least 8 hours.  Glucose, capillary     Status: Abnormal   Collection Time: 12/24/20 11:39 AM  Result Value Ref Range   Glucose-Capillary 157 (H) 70 - 99 mg/dL    Comment: Glucose reference range applies only to samples taken after fasting for at least 8 hours.  Glucose, capillary     Status: Abnormal   Collection Time: 12/24/20  4:06 PM  Result Value Ref Range   Glucose-Capillary 192 (H) 70 - 99 mg/dL    Comment: Glucose reference range applies only to samples taken after fasting for at least 8 hours.   Recent Results (from the past 240 hour(s))  Culture, Urine     Status: None   Collection Time: 12/20/20 10:32 AM   Specimen: Urine, Catheterized  Result Value Ref Range Status   Specimen Description  URINE, CATHETERIZED  Final   Special Requests NONE  Final   Culture   Final    NO GROWTH Performed at [facility], [place] [address] [place], [place] [address]    Report Status 12/21/2020 FINAL  Final  Culture, respiratory (non-expectorated)     Status: None   Collection Time: 12/20/20 11:04 AM   Specimen: Tracheal Aspirate; Respiratory  Result Value Ref Range Status   Specimen Description TRACHEAL ASPIRATE  Final   Special Requests NONE  Final   Gram Stain   Final    RARE WBC PRESENT,BOTH PMN AND MONONUCLEAR RARE GRAM VARIABLE ROD Performed at [facility], [place]  [address] [place], [place] [address]    Culture   Final    FEW ACHROMOBACTER XYLOSOXIDANS FEW ESCHERICHIA COLI    Report Status 12/24/2020 FINAL  Final   Organism ID, Bacteria ESCHERICHIA COLI  Final   Organism ID, Bacteria ACHROMOBACTER XYLOSOXIDANS  Final      Susceptibility   Escherichia coli - MIC*    AMPICILLIN 16 INTERMEDIATE Intermediate     CEFAZOLIN <=4 SENSITIVE Sensitive     CEFEPIME <=0.12 SENSITIVE Sensitive     CEFTAZIDIME <=1 SENSITIVE Sensitive     CEFTRIAXONE <=0.25 SENSITIVE Sensitive     CIPROFLOXACIN <=0.25 SENSITIVE Sensitive     GENTAMICIN <=1 SENSITIVE Sensitive     IMIPENEM <=0.25 SENSITIVE Sensitive     TRIMETH/SULFA <=20 SENSITIVE Sensitive     AMPICILLIN/SULBACTAM 16 INTERMEDIATE Intermediate     PIP/TAZO <=4 SENSITIVE Sensitive     * FEW ESCHERICHIA COLI   Achromobacter xylosoxidans - MIC*    CEFAZOLIN >=64 RESISTANT Resistant     GENTAMICIN 8 INTERMEDIATE Intermediate     CIPROFLOXACIN 1 SENSITIVE Sensitive     IMIPENEM 4 SENSITIVE Sensitive     TRIMETH/SULFA <=20 SENSITIVE Sensitive     * FEW ACHROMOBACTER XYLOSOXIDANS  Culture, blood (Routine X 2) w Reflex to ID Panel     Status: None (Preliminary result)   Collection Time: 12/20/20  5:50 PM   Specimen: BLOOD RIGHT HAND  Result Value Ref Range Status   Specimen Description BLOOD RIGHT HAND  Final   Special Requests   Final    AEROBIC BOTTLE ONLY Blood Culture results may not be optimal due to an inadequate volume of blood received in culture bottles   Culture   Final    NO GROWTH 4 DAYS Performed at [facility], [address] [place], [place] [address]    Report Status PENDING  Incomplete  Culture, blood (Routine X 2) w Reflex to ID Panel     Status: None (Preliminary result)   Collection Time: 12/20/20  5:51 PM   Specimen: BLOOD RIGHT HAND  Result Value Ref Range Status   Specimen Description BLOOD RIGHT HAND  Final   Special Requests   Final    BOTTLES DRAWN AEROBIC AND ANAEROBIC  Blood Culture results may not be optimal due to an inadequate volume of blood received in culture bottles   Culture   Final    NO GROWTH 4 DAYS Performed at [facility], [place] [address] [place], [place] [address]    Report Status PENDING  Incomplete   Creatinine: Recent Labs    12/18/20 0437 12/19/20 0503 12/20/20 0523 12/21/20 0559 12/22/20 0547 12/23/20 0435 12/24/20 0445  CREATININE 1.45* 1.60* 1.74* 2.15* 2.08* 2.27* 2.14*    Xrays: See report/chart NOTED  Impression/Assessment:  PENILE EDEMA FORM THIRD SPACE  LOSS  Plan:  GOOD HYGIENE NO EVIDENCE OF INFECTION OF IMPLANT No need for surgery  Poor surgical candidate reviewed  Gradie Ohm A Tinisha Etzkorn 12/24/2020, 5:01 PM

## 2020-12-24 NOTE — Progress Notes (Signed)
Supervising Physician: [name]  Patient Status:  [facility] - In-pt  Chief Complaint: Intra-abdominal fluid collection  Subjective: Remains on trach/vent support. Not interactive or alert during visit today.  Drain in place with ongoing sero-sanguinous, hazy output.  Family continues to pursue aggressive care for now.   Allergies: Iohexol  Medications: Prior to Admission medications   Medication Sig Start Date End Date Taking? Authorizing Provider  amLODipine (NORVASC) 10 MG tablet Take 1 tablet (10 mg total) by mouth daily. 02/11/20  Yes Mosie Lukes, MD  atorvastatin (LIPITOR) 80 MG tablet TAKE 1 TABLET(80 MG) BY MOUTH DAILY AT 6 PM 02/11/20  Yes Mosie Lukes, MD  carvedilol (COREG) 3.125 MG tablet Take 1 tablet (3.125 mg total) by mouth 2 (two) times daily with a meal. 02/11/20  Yes Mosie Lukes, MD  clopidogrel (PLAVIX) 75 MG tablet TAKE 1 TABLET(75 MG) BY MOUTH DAILY Patient taking differently: Take 75 mg by mouth daily. TAKE 1 TABLET(75 MG) BY MOUTH DAILY 08/21/20  Yes Mosie Lukes, MD  folic acid (FOLVITE) 1 MG tablet Take 1 tablet (1 mg total) by mouth daily. 02/11/20  Yes Mosie Lukes, MD  levETIRAcetam (KEPPRA) 750 MG tablet TAKE 1 TABLET(750 MG) BY MOUTH TWICE DAILY Patient taking differently: Take 750 mg by mouth 2 (two) times daily.  06/30/20  Yes Mosie Lukes, MD  mirtazapine (REMERON) 15 MG tablet Take 15 mg by mouth at bedtime. 11/04/20  Yes [provider]  tetrahydrozoline-zinc (VISINE-AC) 0.05-0.25 % ophthalmic solution Place 1 drop into both eyes daily as needed (dry eyes).    Yes [provider]     Vital Signs: BP (!) 122/56   Pulse 97   Temp 97.7 F (36.5 C) (Axillary)   Resp (!) 25   Ht 5\' 11"  (1.803 m)   Wt 141 lb 5 oz (64.1 kg)   SpO2 100%   BMI 19.71 kg/m   Physical Exam  NAD, not interactive, eyes open Abdomen: Large midline wound. Drain in place. Insertion site intact.   Serosanguinous, hazy output.  25 mL  documented in last 24 hrs   Imaging: CT ABDOMEN PELVIS WO CONTRAST  Result Date: 12/21/2020 CLINICAL DATA:  Suspected abdominal abscess or infection. Follow-up drains. EXAM: CT ABDOMEN AND PELVIS WITHOUT CONTRAST TECHNIQUE: Multidetector CT imaging of the abdomen and pelvis was performed following the standard protocol without IV contrast. COMPARISON:  12/04/2020 FINDINGS: Lower chest: Bilateral pleural effusions layering dependently with collapse of both lower lobes. Hepatobiliary: No significant liver parenchymal finding. 1 cm cyst in the left lobe as seen previously. Pancreas: Normal Spleen: Normal Adrenals/Urinary Tract: Adrenal glands are normal. Kidneys are normal. Previous cystectomy. Right lower quadrant ileal conduit appears unremarkable by CT. Stomach/Bowel: No sign of bowel obstruction. No sign bowel inflammatory process. Left lower quadrant ostomy without complicating feature. Vascular/Lymphatic: Aortic atherosclerosis. No aneurysm. IVC is normal. No retroperitoneal adenopathy or hematoma. Reproductive: Previous penile implant. Other: Drainage catheter in the pelvic midline collection has successfully drained the collection, without evidence significant residua. No second collection is identified in any other location. Very small amount of free intraperitoneal fluid, grossly freely distributed. Musculoskeletal: Ordinary lumbar and hip degenerative changes. IMPRESSION: 1. Drainage catheter in the pelvic midline collection has successfully drained the collection, without evidence significant residua. No second collection is identified in any other location. 2. Bilateral pleural effusions layering dependently with collapse of both lower lobes. 3. Previous cystectomy with right lower quadrant ileal conduit without complicating  feature. Left lower quadrant bowel ostomy without complicating feature. 4. Aortic atherosclerosis. Aortic Atherosclerosis (ICD10-I70.0). Electronically Signed   By: Nelson Chimes  M.D.   On: 12/21/2020 12:29   DG Chest Port 1 View  Result Date: 12/21/2020 CLINICAL DATA:  Acute respiratory failure EXAM: PORTABLE CHEST 1 VIEW COMPARISON:  Yesterday FINDINGS: Normal heart size and mediastinal contours. Extensive opacification of the lower chest with hazy density. More airspace type ill-defined opacity in the right upper lobe that is new/progressed. No visible pneumothorax. Left PICC with tip at the upper cavoatrial junction. Tracheostomy tube in place. IMPRESSION: 1. Increased airspace disease in the right upper lobe. 2. Hazy opacity at the bases usually from pleural fluid/pulmonary collapse. Electronically Signed   By: Monte Fantasia M.D.   On: 12/21/2020 08:33    Labs:  CBC: Recent Labs    12/20/20 0523 12/20/20 1424 12/21/20 0559 12/21/20 0810 12/22/20 0547  WBC 25.1*  --  20.0* 24.5* 24.1*  HGB 5.6* 8.5* 6.2* 7.7* 7.7*  HCT 18.4* 25.6* 19.1* 24.3* 24.4*  PLT 395  --  281 354 318    COAGS: No results for input(s): INR, APTT in the last 8760 hours.  BMP: Recent Labs    12/27/19 0548 12/31/19 0221 11/29/2020 1830 12/21/20 0559 12/22/20 0547 12/23/20 0435 12/24/20 0445  NA 142 138   < > 135 131* 132* 133*  K 4.3 4.4   < > 3.1* 3.1* 3.4* 3.6  CL 108 107   < > 102 97* 94* 96*  CO2 25 21*   < > 18* 19* 19* 20*  GLUCOSE 98 113*   < > 170* 144* 154* 193*  BUN 22 34*   < > 121* 137* 150* 153*  CALCIUM 9.0 9.0   < > 7.7* 8.1* 8.0* 8.2*  CREATININE 1.02 1.21   < > 2.15* 2.08* 2.27* 2.14*  GFRNONAA >60 59*   < > 32* 33* 30* 32*  GFRAA >60 >60  --   --   --   --   --    < > = values in this interval not displayed.    LIVER FUNCTION TESTS: Recent Labs    12/15/20 0813 12/18/20 0437 12/21/20 0559 12/22/20 0547  BILITOT 1.1 1.1 1.4* 1.1  AST 35 27 27 32  ALT 33 29 24 25   ALKPHOS 116 95 74 102  PROT 6.5 5.8* 5.8* 6.6  ALBUMIN 1.7* 1.5* 1.4* 1.4*    Assessment and Plan: SBO s/p ex lap with SBR, placement of a gastrostomy tube12/7 Takeback 12/12  by Dr. Zenia Resides for necrotic SB with resection of additional 60-70cm of SB inclusive of prior anastomosis in discontinuity with open abdomen.  S/P ex lap, ileostomy, mucous fistula, closure 12/15 Abscess by CT 12/23 s/p perc drain on 12/24 by Dr. Vernard Gambles Back on trach vent Appears ill, frail.  Afebrile Drain in place without issues. Ongoing hazy, serosanguinous fluid. Surgery feels ready for removal. Discussed with Dr. Serafina Royals, CT Abdomen Pelvis from 1/9 reviewed, recommends drain injection prior to removal.  This has been ordered. Continue current drain management.  Electronically Signed: Docia Barrier, PA 12/24/2020, 3:49 PM   I spent a total of 15 Minutes at the the patient's bedside AND on the patient's hospital floor or unit, greater than 50% of which was counseling/coordinating care for intra-abdominal fluid collection.

## 2020-12-24 NOTE — Progress Notes (Signed)
PHARMACY - TOTAL PARENTERAL NUTRITION CONSULT NOTE  Indication: Small bowel obstruction, prolonged ileus  Patient Measurements: Height: 5\' 11"  (180.3 cm) Weight: 61.2 kg (134 lb 14.7 oz) IBW/kg (Calculated) : 75.3 TPN AdjBW (KG): 60.8 Body mass index is 18.82 kg/m. Usual Weight: 137 lbs  Assessment: 74 YOM presented 12/05/2020 with nausea, vomiting, abdominal pain after leaving AMA at [facility] with known SBO. Patient has been on bowel rest with NG tube decompression. Patient continued to have persistent SBO on imaging with no evidence of improvement, now s/p ex-lap with LoA, SBR and placement of Gtube on 11/20/2020. Pharmacy consulted for TPN management - patient received TPN 11/19/20 through 11/29/20.  Reconsulted to resume TPN on 12/2. TF currently at 30 mL/hr given high ostomy output and concern for tube feeds not absorbing. Patient on loperamide, lomotil, tincture of opium, morphine sulfate, octreotide and fiber without improvement in output.   Glucose / Insulin: no hx DM - CBGs mostly <180  - utilized 20 units mSSI/24hrs Electrolytes: Na 132>133 (FW adjusted to 237ml PT q8h), K normlized, CO2 improved to 20, phos 4.1 (reduced in TPN yesterday), mag 2.3; CorrCa 9.8, others WNL Renal: Scr down to 2.14, BUN 153  LFTs / TGs: LFTs / TG wnl, Tbili up to 1.4 Prealbumin / albumin: Prealbumin 17.9 > 12.5, albumin 1.4 Intake / Output; MIVF: UOP 1.4 ml/kg/hr, ileostomy output/24h: 125mL (on fiber, opium tincture, lomotil, loperamide, morphine sulfate, octreotide and unlikely to improve per Surgery), drain O/P 53ml, net +18.1L GI Imaging: 12/11 DG Abd - stomach gas-filled, without distention, no obvious free air 12/23 CT abd pelvis - large loculated collection in low abd/pelvis, few foci of internal gas present, new RUQ ileostomy, thickened distal SB/colon, subtotal atelectatic collapse of LLL, partial collapse of RLL, small R pleural effusion, body wall edema Surgeries / Procedures: 12/12:  re-exploration and washout with new copious drainage from incision concerning for enteric leak vs. enterotomy >> now s/p, bowel necrosis resected 12/15: ex-lap with ileostomy with fistula creation, abdominal closure 12/24 Pelvic abscess drain 12/29 - trach placed  Central access: PICC placed 12/02/2020 TPN start date: 11/19/20>>11/29/20; restart 12/03/20 >>  Nutritional Goals (updated per RD rec 12/30): 2300-2700 kCal, 120-135g protein, >2L fluid per day  Goal TPN rate of 75 cc/hr will provide 120g AA, 432g CHO, and 36g lipids (15% of total kCal due to [employer]) for a total of 2311 kcal, meeting 100% of needs  Current Nutrition:  TPN Vital AF at 20 ml/hr (goal rate 65 ml/hr) - not advancing rate d/t ileostomy output ProSource TF 45 ml TID (each 11g AA and 40 kcal)  Plan:  Continue concentrated TPN at goal rate of 75 ml/hr at 1800. TPN will provide 120g protein, 432g CHO, 36g SMOF lipids (~15% of total kcal due to national shortage), and 2311 kcal - meeting 100% of estimated needs. 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 F/u long-term plans, GOC discussions   Albertina Parr, PharmD., BCPS, BCCCP Clinical Pharmacist Please refer to [facility] for unit-specific pharmacist

## 2020-12-24 NOTE — Progress Notes (Signed)
NAME:  Ronald Wolf, MRN:  [id number], DOB:  22-Feb-1946, LOS: 23 ADMISSION DATE:  11/25/2020, CONSULTATION DATE:  12/7 REFERRING MD:  Cyndia Skeeters, CHIEF COMPLAINT:  Abdominal pain   Brief History:  Mr.Nagorski is a 75 yo M w/ PMH of CVA, Seizures, Dementia, Bladder ca s/p urostomy, tobacco use, right glottic carcinoma s/p excision presenting with sbo. Found to have ischemic bowel requiring ex-lap 12/7.  Course complicated by septic shock, acute respiratory failure and AKI return to the OR 12/12 for necrotic small bowel resection of additional 70 cm inclusive of prior anastomosis 12/15 abdominal closure with ileostomy He has had a prolonged ICU stay with ventilator dependence, had a tracheostomy on 12/29  Past Medical History:  CVA, Dementia, Bladder CA s/p nephrostomy, tobacco use, right glottic carcinoma s/p excision, seizures.  Significant Hospital Events:  11/30/2020 Admission 11/15/2020 OR 12/12/2020 Admit to ICU 12/10 Brief SVT with associated hypertension. Self limited Given fentanyl, metop and hydral for HTN 12/11 Good urine output with Lasix , failed extubation, reintubated within 2 hours due to inability to clear secretions 12/12 return OR for enteric leak-- found to have bowel necrosis. Left in discontinuity  12/13 self extubated, reintubated. On pressors. Family meeting with PCCM and CCS to discuss possible next steps.  12/15-was back in OR, abdominal closure, RUQ mucus fistula and L ileostomy  12/19: No significant overnight events, high ileostomy output 12/21: scheduled for family meeting. Worse hypernatremia, continued high ileostomy output.  Attempted PSV/CPAP wean, pulled volumes of high 100s -low/mid 200s with tachypnea. Placed back of full support.  12/22 ongoing high output GI/ ileostomy, TPN started; weaned 12/5 all day; off fentanyl gtt, remains on precedex  12/24 IR drain , 1 unit PRBC transfusion for hemoglobin 6.5 12/26 added vancomycin for GPC in blood cultures and RIC suggesting  MRSA 12/29 tracheostomy 12/31 Palliative discussion > family understanding that his situation is critical and he has not made much improvement  1/6 ongoing palliative conversations. No progress from pt standpoint. 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  1/11 seen by surgery, IR consulted to remove percutaneous drain.  Added octreotide in effort to try to decrease output 1/12: More fatigued back on vent Consults:  General Surgery PCCM Palliative Care  Procedures:  11/24/2020 Ex-lap, G-tube placement 12/7 PICC >> ETT 12/7 >> 12/11, >> 12/15- back to OR for abdominal closure, exploratory laparotomy  12/24 CT-guided right pelvic abscess drain 12/29 trach  Significant Diagnostic Tests:  CT abd/pelvis 12/2 > High-grade distal small bowel obstruction, possibly due to adhesion. Mild ascites and diffuse mesenteric edema. No evidence of focal inflammatory process or abscess. Small bilateral pleural effusions and mild right lower lobe Atelectasis. CT abdomen/pelvis 12/23 Interval development of a large loculated collection in the low anterior abdomen and pelvis measuring approximately 6.1 x 11.3 x12.1 cm in size subjacent to the operative site. Few foci of internal gas are present. , no frank spillage of enteric contrast   CT H 1/6 > no acute intracranial abnormality. Chronic L MCA changes from prior L MCA infarct. Atrophic and chronic white matter ischemic changes.  Stable L basal ganglia infarcts  CT abd pelvis 1/10: pelvis abscess drained. BLL effusions w/  atx. Expected post op changes  Micro Data:  12/07/2020 COVID/Flu negative 12/7 BA: Few GNR rare GPC -- predominantly PMN  12/7  BCx> Neg 12/11 respiratory -no organism in tracheal aspirate 12/24 BC >> RIC showing both staph epidermis 1/4 12/24 abscess >> E. Faecalis, B fragilis (B lactamase) 12/27 trach aspirate>> normal flora  Antimicrobials:  Fluc 12/8>> 12/10 Zosyn 12/8> 12/12 cefotetain 12/15>> off Zosyn 12/23 >> 12/30 eraxis 12/24 >> 1/2 Vancomycin 12/26 >>12/31 Unasyn 12/31 >1/9 zosyn 1/9>> vanc 1/9 >>off  Interim History / Subjective:   Back on ventilator  Objective   Blood pressure (Abnormal) 158/73, pulse (Abnormal) 102, temperature 97.6 F (36.4 C), temperature source Axillary, resp. rate (Abnormal) 22, height 5' 11"  (1.803 m), weight 64.1 kg, SpO2 97 %.    Vent Mode: PRVC FiO2 (%):  [28 %-50 %] 50 % Set Rate:  [14 bmp] 14 bmp Vt Set:  [600 mL] 600 mL PEEP:  [5 cmH20] 5 cmH20 Plateau Pressure:  [22 cmH20-26 cmH20] 25 cmH20   Intake/Output Summary (Last 24 hours) at 12/24/2020 0920 Last data filed at 12/24/2020 0800 Gross per 24 hour  Intake 3166.27 ml  Output 3825 ml  Net -658.73 ml   Filed Weights   12/18/20 0411 12/19/20 0431 12/23/20 0301  Weight: 64.1 kg 64.1 kg 64.1 kg    Examination: General this is an extremely frail 75 year old male with critical illness related failure to thrive back on mechanical ventilation now HEENT temporal wasting mucous membranes moist tracheostomy with bloody tracheal secretions Pulmonary: Coarse scattered rhonchi Abdomen: Soft.  Mid abdominal dressing intact.  Stomas pink.  Drain with dark bilious fluid but diminished GU clear yellow Extremities diffuse anasarca Neuro awake seems more interactive but diffusely weak    Resolved Hospital Problem list   HCAP  Assessment & Plan:   Ischemic bowel perf s/p ex-lap x 2 for resection, mucus fistula ileostomy, G tube placement c/b Intra-abdominal abscess Plan Will continue Zosyn well has drain in place, IR has been consulted to remove it per surgical recommendations Can probably discontinue Zosyn in  the following days after drain removed we will trend CBC  Severe protein calorie malnutrition High ileostomy output, enteral malabsorption -->short gut syndrome  F/U CT abd/pelvis w/ what looks like resolution of pelvic access -CCS following Plan Continue trophic feeds  Continue TPN  Continue Imodium, EN morphine, also added octreotide we will increase the dosing of this as output has not really diminished    Trach/Vent dependence  Bilateral pleural effusions due to malnutrition  Trach bleeding due to suction trauma Was looking good initially on morning rounds now back on vent after desaturating after suction, suspect deconditioning and malnutrition is also a large contributing factor Plan As needed ventilator Aerosol trach collar Try to limit suctioning A.m. chest x-ray  ecoli and achromobacter xylosoxidans in sputum -suspect the xylosoxidans is colonization Plan Continue to trend CBC Zosyn covering E. coli  AKI due to intravascular volume contraction from third space losses, hypoalbuminemia, Hypokalemia, Hyponatremia, Anion gap metabolic acidosis  Plan Replace potassium again  Renal dose medications  Limiting free water  We will give a dose of albumin and Lasix to try to mobilize third spaced fluid   Acute encephalopathy, multifactorial Prior L MCA CVA -New R sided facial droop/ flaccid RUE tone 1/6, CT head negative Plan Holding Plavix and aspirin due to bleeding Supportive care   Hypertension, uncontrolled Tachybradycardia syndrome Plan Avoid beta-blockade Continue telemetry  Atrial fibrillation with RVR> resolved -Not on anticoagulation because of  recent surgery Plan tele  Seizures well controlled Plan keppra   Bladder Cancer Plan Urostomy care   Anemia, no obvious source of ongoing blood loss, hemoglobin dropped from 7.2-5.6 >> improved to 8.5 with 2 units PRBC 1/8 Repeat hemoglobin 1/9 again dropped to 6.2>> 7.7 on repeat Plan Trend cbc  Goals  of Care Ongoing GOC convos (many interdisciplinary meetings, now being followed by palliative care) Poor prognosis has been communicated to family by several providers   Daily Goals Checklist  Pain/Anxiety/Delirium protocol (if indicated): enteral morphine only.  VAP protocol (if indicated): bundle in place Respiratory support goals: trach collar trials today.  Blood pressure target: SBP<160 DVT prophylaxis:  subcutaneous heparin Nutrition Status: severe malnutrition, continue TPN and try elemental feeds GI prophylaxis: pantoprazole Fluid status goals: euvolemia 1/11 Urinary catheter: Guide hemodynamic management Central lines: PICC Glucose control: sliding scale Mobility/therapy needs: bedrest Antibiotic de-escalation: Zosyn. Home medication reconciliation: on hold  Daily labs: CBC, BMP Code Status: No CPR but continue aggressive care otherwise  Family Communication: Last updated 1/8 Disposition: ICU  Goals of Care:  Last date of multidisciplinary goals of care discussion: 1/4. On-going w/ family.  Family and staff present: Patient's daughter Ms. Alveta Heimlich Summary of discussion:  1/5- Palliative care met with family - -desired for ongoing aggressive medical care as offered, limited code blue Code Status: limited code blue no shock, no CPR  .my cct 34 min Erick Colace ACNP-BC [place] Pager # [phone] OR # [phone] if no answer    12/24/2020, 9:20 AM

## 2020-12-25 ENCOUNTER — Inpatient Hospital Stay (HOSPITAL_COMMUNITY): Payer: Medicare PPO

## 2020-12-25 DIAGNOSIS — N179 Acute kidney failure, unspecified: Secondary | ICD-10-CM | POA: Diagnosis not present

## 2020-12-25 DIAGNOSIS — E43 Unspecified severe protein-calorie malnutrition: Secondary | ICD-10-CM | POA: Diagnosis not present

## 2020-12-25 DIAGNOSIS — K651 Peritoneal abscess: Secondary | ICD-10-CM | POA: Diagnosis not present

## 2020-12-25 DIAGNOSIS — J9601 Acute respiratory failure with hypoxia: Secondary | ICD-10-CM | POA: Diagnosis not present

## 2020-12-25 HISTORY — PX: IR SINUS/FIST TUBE CHK-NON GI: IMG673

## 2020-12-25 LAB — COMPREHENSIVE METABOLIC PANEL
ALT: 33 U/L (ref 0–44)
AST: 38 U/L (ref 15–41)
Albumin: 1.6 g/dL — ABNORMAL LOW (ref 3.5–5.0)
Alkaline Phosphatase: 123 U/L (ref 38–126)
Anion gap: 17 — ABNORMAL HIGH (ref 5–15)
BUN: 164 mg/dL — ABNORMAL HIGH (ref 8–23)
CO2: 23 mmol/L (ref 22–32)
Calcium: 8.8 mg/dL — ABNORMAL LOW (ref 8.9–10.3)
Chloride: 97 mmol/L — ABNORMAL LOW (ref 98–111)
Creatinine, Ser: 2.2 mg/dL — ABNORMAL HIGH (ref 0.61–1.24)
GFR, Estimated: 31 mL/min — ABNORMAL LOW (ref >60)
Glucose, Bld: 195 mg/dL — ABNORMAL HIGH (ref 70–99)
Potassium: 3.5 mmol/L (ref 3.5–5.1)
Sodium: 137 mmol/L (ref 135–145)
Total Bilirubin: 1.6 mg/dL — ABNORMAL HIGH (ref 0.3–1.2)
Total Protein: 6.5 g/dL (ref 6.5–8.1)

## 2020-12-25 LAB — CULTURE, BLOOD (ROUTINE X 2)
Culture: NO GROWTH
Culture: NO GROWTH

## 2020-12-25 LAB — GLUCOSE, CAPILLARY
Glucose-Capillary: 190 mg/dL — ABNORMAL HIGH (ref 70–99)
Glucose-Capillary: 170 mg/dL — ABNORMAL HIGH (ref 70–99)
Glucose-Capillary: 132 mg/dL — ABNORMAL HIGH (ref 70–99)
Glucose-Capillary: 179 mg/dL — ABNORMAL HIGH (ref 70–99)
Glucose-Capillary: 133 mg/dL — ABNORMAL HIGH (ref 70–99)
Glucose-Capillary: 159 mg/dL — ABNORMAL HIGH (ref 70–99)

## 2020-12-25 LAB — PHOSPHORUS: Phosphorus: 4.1 mg/dL (ref 2.5–4.6)

## 2020-12-25 LAB — MAGNESIUM: Magnesium: 2.2 mg/dL (ref 1.7–2.4)

## 2020-12-25 MED ORDER — TRACE MINERALS CU-MN-SE-ZN 300-55-60-3000 MCG/ML IV SOLN
INTRAVENOUS | Status: AC
  Filled 2020-12-25: qty 804

## 2020-12-25 MED ORDER — IOHEXOL 300 MG/ML  SOLN
50.0000 mL | Freq: Once | INTRAMUSCULAR | Status: AC | PRN
  Administered 2020-12-25: 10 mL

## 2020-12-25 MED ORDER — DIPHENHYDRAMINE HCL 50 MG/ML IJ SOLN
50.0000 mg | Freq: Once | INTRAMUSCULAR | Status: AC
  Administered 2020-12-25: 50 mg via INTRAVENOUS
  Filled 2020-12-25: qty 1

## 2020-12-25 NOTE — Progress Notes (Signed)
PHARMACY - TOTAL PARENTERAL NUTRITION CONSULT NOTE  Indication: Small bowel obstruction, prolonged ileus  Patient Measurements: Height: 5\' 11"  (180.3 cm) Weight: 61.2 kg (134 lb 14.7 oz) IBW/kg (Calculated) : 75.3 TPN AdjBW (KG): 60.8 Body mass index is 18.82 kg/m. Usual Weight: 137 lbs  Assessment: 74 YOM presented 11/14/2020 with nausea, vomiting, abdominal pain after leaving AMA at [facility] with known SBO. Patient has been on bowel rest with NG tube decompression. Patient continued to have persistent SBO on imaging with no evidence of improvement, now s/p ex-lap with LoA, SBR and placement of Gtube on 11/15/2020. Pharmacy consulted for TPN management - patient received TPN 11/19/20 through 11/29/20.  Reconsulted to resume TPN on 12/2. TF currently at 30 mL/hr given high ostomy output and concern for tube feeds not absorbing. Patient on loperamide, lomotil, tincture of opium, morphine sulfate, octreotide and fiber without improvement in output.   Glucose / Insulin: no hx DM - CBGs mostly <180  - utilized 20 units mSSI/24hrs Electrolytes: Na 133>137 (FW adjusted to 258ml PT q8h), K 3.5, CO2 improved to 23, phos 4.1, mag 2.2; CorrCa 10.3, others WNL Renal: Scr down to 2.14, BUN 153  LFTs / TGs: LFTs / TG wnl, Tbili up to 1.6 Prealbumin / albumin: Prealbumin 17.9 > 12.5, albumin 1.6 Intake / Output; MIVF: UOP 1.6 ml/kg/hr, ileostomy output/24h: 1313mL (on fiber, opium tincture, lomotil, loperamide, morphine sulfate, octreotide and unlikely to improve per Surgery), drain O/P 23ml, net +17.5L GI Imaging: 12/11 DG Abd - stomach gas-filled, without distention, no obvious free air 12/23 CT abd pelvis - large loculated collection in low abd/pelvis, few foci of internal gas present, new RUQ ileostomy, thickened distal SB/colon, subtotal atelectatic collapse of LLL, partial collapse of RLL, small R pleural effusion, body wall edema Surgeries / Procedures: 12/12: re-exploration and washout with new copious  drainage from incision concerning for enteric leak vs. enterotomy >> now s/p, bowel necrosis resected 12/15: ex-lap with ileostomy with fistula creation, abdominal closure 12/24 Pelvic abscess drain 12/29 - trach placed  Central access: PICC placed 11/18/20 TPN start date: 11/19/20>>11/29/20; restart 12/03/20 >>  Nutritional Goals (updated per RD rec 12/30): 2300-2700 kCal, 120-135g protein, >2L fluid per day  Goal TPN rate of 75 cc/hr will provide 120g AA, 432g CHO, and 36g lipids (15% of total kCal due to [employer]) for a total of 2311 kcal, meeting 100% of needs  Current Nutrition:  TPN Vital AF at 20 ml/hr (goal rate 65 ml/hr) - not advancing rate d/t ileostomy output ProSource TF 45 ml TID (each 11g AA and 40 kcal)  Plan:  Continue concentrated TPN at goal rate of 75 ml/hr at 1800. TPN will provide 120g protein, 432g CHO, 36g SMOF lipids (~15% of total kcal due to national shortage), and 2311 kcal - meeting 100% of estimated needs. 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 F/u long-term plans, GOC discussions   Albertina Parr, PharmD., BCPS, BCCCP Clinical Pharmacist Please refer to [facility] for unit-specific pharmacist

## 2020-12-25 NOTE — Progress Notes (Signed)
Patient transported to IR and back to 4Q28 without complications.

## 2020-12-25 NOTE — Progress Notes (Signed)
NAME:  Ronald Wolf, MRN:  [id number], DOB:  1946-04-04, LOS: 1 ADMISSION DATE:  12/03/2020, CONSULTATION DATE:  12/7 REFERRING MD:  Cyndia Skeeters, CHIEF COMPLAINT:  Abdominal pain   Brief History:  Ronald Wolf is a 75 yo M w/ PMH of CVA, Seizures, Dementia, Bladder ca s/p urostomy, tobacco use, right glottic carcinoma s/p excision presenting with sbo. Found to have ischemic bowel requiring ex-lap 12/7.  Course complicated by septic shock, acute respiratory failure and AKI return to the OR 12/12 for necrotic small bowel resection of additional 70 cm inclusive of prior anastomosis 12/15 abdominal closure with ileostomy He has had a prolonged ICU stay with ventilator dependence, had a tracheostomy on 12/29  Past Medical History:  CVA, Dementia, Bladder CA s/p nephrostomy, tobacco use, right glottic carcinoma s/p excision, seizures.  Significant Hospital Events:  11/25/2020 Admission 12/10/2020 OR 12/09/2020 Admit to ICU 12/10 Brief SVT with associated hypertension. Self limited Given fentanyl, metop and hydral for HTN 12/11 Good urine output with Lasix , failed extubation, reintubated within 2 hours due to inability to clear secretions 12/12 return OR for enteric leak-- found to have bowel necrosis. Left in discontinuity  12/13 self extubated, reintubated. On pressors. Family meeting with PCCM and CCS to discuss possible next steps.  12/15-was back in OR, abdominal closure, RUQ mucus fistula and L ileostomy  12/19: No significant overnight events, high ileostomy output 12/21: scheduled for family meeting. Worse hypernatremia, continued high ileostomy output.  Attempted PSV/CPAP wean, pulled volumes of high 100s -low/mid 200s with tachypnea. Placed back of full support.  12/22 ongoing high output GI/ ileostomy, TPN started; weaned 12/5 all day; off fentanyl gtt, remains on precedex  12/24 IR drain , 1 unit PRBC transfusion for hemoglobin 6.5 12/26 added vancomycin for GPC in blood cultures and RIC suggesting  MRSA 12/29 tracheostomy 12/31 Palliative discussion > family understanding that his situation is critical and he has not made much improvement  1/6 ongoing palliative conversations. No progress from pt standpoint. 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  1/11 seen by surgery, IR consulted to remove percutaneous drain.  Added octreotide in effort to try to decrease output 1/12: More fatigued back on vent Consults:  General Surgery PCCM Palliative Care  Procedures:  11/17/2020 Ex-lap, G-tube placement 12/7 PICC >> ETT 12/7 >> 12/11, >> 12/15- back to OR for abdominal closure, exploratory laparotomy  12/24 CT-guided right pelvic abscess drain 12/29 trach  Significant Diagnostic Tests:  CT abd/pelvis 12/2 > High-grade distal small bowel obstruction, possibly due to adhesion. Mild ascites and diffuse mesenteric edema. No evidence of focal inflammatory process or abscess. Small bilateral pleural effusions and mild right lower lobe Atelectasis. CT abdomen/pelvis 12/23 Interval development of a large loculated collection in the low anterior abdomen and pelvis measuring approximately 6.1 x 11.3 x12.1 cm in size subjacent to the operative site. Few foci of internal gas are present. , no frank spillage of enteric contrast   CT H 1/6 > no acute intracranial abnormality. Chronic L MCA changes from prior L MCA infarct. Atrophic and chronic white matter ischemic changes.  Stable L basal ganglia infarcts  CT abd pelvis 1/10: pelvis abscess drained. BLL effusions w/  atx. Expected post op changes  Micro Data:  12/10/2020 COVID/Flu negative 12/7 BA: Few GNR rare GPC -- predominantly PMN  12/7  BCx> Neg 12/11 respiratory -no organism in tracheal aspirate 12/24 BC >> RIC showing both staph epidermis 1/4 12/24 abscess >> E. Faecalis, B fragilis (B lactamase) 12/27 trach aspirate>> normal flora  Antimicrobials:  Fluc 12/8>> 12/10 Zosyn 12/8> 12/12 cefotetain 12/15>> off Zosyn 12/23 >> 12/30 eraxis 12/24 >> 1/2 Vancomycin 12/26 >>12/31 Unasyn 12/31 >1/9 zosyn 1/9>> vanc 1/9 >>off  Interim History / Subjective:   Back on ventilator  Objective   Blood pressure 127/68, pulse 87, temperature 100.2 F (37.9 C), temperature source Axillary, resp. rate 16, height 5' 11"  (1.803 m), weight 64.1 kg, SpO2 100 %.    Vent Mode: PRVC FiO2 (%):  [30 %-40 %] 30 % Set Rate:  [14 bmp] 14 bmp Vt Set:  [600 mL] 600 mL PEEP:  [5 cmH20] 5 cmH20 Plateau Pressure:  [24 cmH20-25 cmH20] 25 cmH20   Intake/Output Summary (Last 24 hours) at 12/25/2020 0858 Last data filed at 12/25/2020 0700 Gross per 24 hour  Intake 2351.73 ml  Output 3455 ml  Net -1103.27 ml   Filed Weights   12/18/20 0411 12/19/20 0431 12/23/20 0301  Weight: 64.1 kg 64.1 kg 64.1 kg    Examination: General this is a 75 year old black male he is back on mechanical ventilation appears in no acute distress today HEENT normocephalic atraumatic does have temporal wasting tracheostomies unremarkable Pulmonary equal bilateral breath sounds no accessory use on full support Cardiac regular rate and rhythm Abdomen dressing intact multiple ostomy sites look pink and without concern lower pelvic floor drain intact GU clear yellow Neuro opens eyes, seems a bit more interactive.  Very weak.   Resolved Hospital Problem list   HCAP  Assessment & Plan:   Ischemic bowel perf s/p ex-lap x 2 for resection, mucus fistula ileostomy, G tube placement c/b Intra-abdominal abscess Plan Interventional surgery planning on doing injection study to drain before drain removal  Will continue IV Zosyn, with plan to extend this 5 days post drain  removal  Continue to trend CBC  Severe protein calorie malnutrition High ileostomy output, enteral malabsorption -->short gut syndrome  F/U CT abd/pelvis w/ what looks like resolution of pelvic access -CCS following Really just not absorbing any nutrition Plan Continue trophic feeds  Continue TPN, however he is not a good candidate for long-term here  Continuing Imodium, morphine, octreotide.  Really need extensive goals of care   Trach/Vent dependence  Bilateral pleural effusions due to malnutrition  Trach bleeding due to suction trauma Severe protein calorie malnutrition, deconditioning and overall failure to thrive resulting in very poor stamina and cough mechanics Plan Continue ventilator as needed, continue to attempt aerosol trach collar daily VAP bundle  ecoli and achromobacter xylosoxidans in sputum -suspect the xylosoxidans is colonization Plan Trending CBC  AKI due to intravascular volume contraction from third space losses, hypoalbuminemia, Hypokalemia, Hyponatremia, Anion gap metabolic acidosis  -> Renal function fairly stable even with attempted diuresis Plan Repeat albumin and Lasix today in effort to mobilize fluid given his significant anasarca Empirically replace potassium AM chemistry  Acute encephalopathy, multifactorial Prior L MCA CVA -New R sided facial droop/ flaccid RUE tone 1/6, CT head negative Plan Holding Plavix and aspirin due to history of bleeding   Hypertension, uncontrolled Tachybradycardia syndrome Plan Continue telemetry monitoring Avoiding beta-blockade  Atrial fibrillation with RVR> resolved -Not on anticoagulation because of recent surgery Plan Telemetry  Seizures well controlled Plan Keppra  Bladder Cancer Also has history of penis prosthetic Given his severe anasarca urology did evaluate the patient on 1/12 to ensure prosthetic did not appear infected they felt no evidence of infection Plan Continue routine urostomy  care Keep penis and testicles elevated Try to maximize nutrition  Anemia, no obvious source of ongoing blood loss, hemoglobin dropped from 7.2-5.6 >> improved to 8.5 with 2 units PRBC 1/8 Repeat hemoglobin 1/9 again dropped to 6.2>> 7.7 on repeat Plan CBC a.m.  Goals of Care Ongoing GOC convos (many interdisciplinary meetings, now being followed by palliative care) Palliative care working with family   Daily Goals Checklist  Pain/Anxiety/Delirium protocol (if indicated): enteral morphine only.  VAP protocol (if indicated): bundle in place Respiratory support goals: trach collar trials today.  Blood pressure target: SBP<160 DVT prophylaxis:  subcutaneous heparin Nutrition Status: severe malnutrition, continue TPN and try elemental feeds GI prophylaxis: pantoprazole Fluid status goals: euvolemia 1/11 Urinary catheter: Guide hemodynamic management Central lines: PICC Glucose control: sliding scale Mobility/therapy needs: bedrest Antibiotic de-escalation: Zosyn. Home medication reconciliation: on hold  Daily labs: CBC, BMP Code Status: No CPR but continue aggressive care otherwise  Family Communication: Last updated 1/8 Disposition: ICU  Goals of Care:  Last date of multidisciplinary goals of care discussion: 1/4. On-going w/ family.  Family and staff present: Patient's daughter Ms. Alveta Heimlich Summary of discussion:  1/5- Palliative care met with family - -desired for ongoing aggressive medical care as offered, limited code blue Code Status: limited code blue no shock, no CPR  Erick Colace ACNP-BC [place] Pager # [phone] OR # [phone] if no answer    12/25/2020, 8:58 AM

## 2020-12-26 DIAGNOSIS — J9601 Acute respiratory failure with hypoxia: Secondary | ICD-10-CM | POA: Diagnosis not present

## 2020-12-26 DIAGNOSIS — N179 Acute kidney failure, unspecified: Secondary | ICD-10-CM | POA: Diagnosis not present

## 2020-12-26 DIAGNOSIS — G9340 Encephalopathy, unspecified: Secondary | ICD-10-CM | POA: Diagnosis not present

## 2020-12-26 DIAGNOSIS — Z8673 Personal history of transient ischemic attack (TIA), and cerebral infarction without residual deficits: Secondary | ICD-10-CM | POA: Diagnosis not present

## 2020-12-26 DIAGNOSIS — Z515 Encounter for palliative care: Secondary | ICD-10-CM | POA: Diagnosis not present

## 2020-12-26 DIAGNOSIS — Z8551 Personal history of malignant neoplasm of bladder: Secondary | ICD-10-CM | POA: Diagnosis not present

## 2020-12-26 LAB — CBC
HCT: 24.1 % — ABNORMAL LOW (ref 39.0–52.0)
Hemoglobin: 7.6 g/dL — ABNORMAL LOW (ref 13.0–17.0)
MCH: 31.1 pg (ref 26.0–34.0)
MCHC: 31.5 g/dL (ref 30.0–36.0)
MCV: 98.8 fL (ref 80.0–100.0)
Platelets: 271 10*3/uL (ref 150–400)
RBC: 2.44 MIL/uL — ABNORMAL LOW (ref 4.22–5.81)
RDW: 20.8 % — ABNORMAL HIGH (ref 11.5–15.5)
WBC: 18.8 10*3/uL — ABNORMAL HIGH (ref 4.0–10.5)
nRBC: 0.3 % — ABNORMAL HIGH (ref 0.0–0.2)

## 2020-12-26 LAB — GLUCOSE, CAPILLARY
Glucose-Capillary: 171 mg/dL — ABNORMAL HIGH (ref 70–99)
Glucose-Capillary: 127 mg/dL — ABNORMAL HIGH (ref 70–99)
Glucose-Capillary: 168 mg/dL — ABNORMAL HIGH (ref 70–99)
Glucose-Capillary: 162 mg/dL — ABNORMAL HIGH (ref 70–99)
Glucose-Capillary: 126 mg/dL — ABNORMAL HIGH (ref 70–99)
Glucose-Capillary: 169 mg/dL — ABNORMAL HIGH (ref 70–99)

## 2020-12-26 LAB — BASIC METABOLIC PANEL
Anion gap: 16 — ABNORMAL HIGH (ref 5–15)
BUN: 178 mg/dL — ABNORMAL HIGH (ref 8–23)
CO2: 26 mmol/L (ref 22–32)
Calcium: 8.8 mg/dL — ABNORMAL LOW (ref 8.9–10.3)
Chloride: 97 mmol/L — ABNORMAL LOW (ref 98–111)
Creatinine, Ser: 2.39 mg/dL — ABNORMAL HIGH (ref 0.61–1.24)
GFR, Estimated: 28 mL/min — ABNORMAL LOW (ref >60)
Glucose, Bld: 203 mg/dL — ABNORMAL HIGH (ref 70–99)
Potassium: 3.2 mmol/L — ABNORMAL LOW (ref 3.5–5.1)
Sodium: 139 mmol/L (ref 135–145)

## 2020-12-26 LAB — MAGNESIUM: Magnesium: 2.3 mg/dL (ref 1.7–2.4)

## 2020-12-26 LAB — PHOSPHORUS: Phosphorus: 3.4 mg/dL (ref 2.5–4.6)

## 2020-12-26 MED ORDER — POTASSIUM CHLORIDE 20 MEQ PO PACK
20.0000 meq | PACK | ORAL | Status: AC
  Administered 2020-12-26 (×2): 20 meq
  Filled 2020-12-26: qty 1

## 2020-12-26 MED ORDER — POTASSIUM CHLORIDE 10 MEQ/50ML IV SOLN
10.0000 meq | INTRAVENOUS | Status: AC
  Administered 2020-12-26 (×4): 10 meq via INTRAVENOUS
  Filled 2020-12-26 (×3): qty 50

## 2020-12-26 MED ORDER — POTASSIUM CHLORIDE CRYS ER 20 MEQ PO TBCR
20.0000 meq | EXTENDED_RELEASE_TABLET | ORAL | Status: DC
  Filled 2020-12-26: qty 1

## 2020-12-26 MED ORDER — FUROSEMIDE 10 MG/ML IJ SOLN
40.0000 mg | Freq: Once | INTRAMUSCULAR | Status: AC
  Administered 2020-12-26: 40 mg via INTRAVENOUS
  Filled 2020-12-26: qty 4

## 2020-12-26 MED ORDER — TRACE MINERALS CU-MN-SE-ZN 300-55-60-3000 MCG/ML IV SOLN
INTRAVENOUS | Status: AC
  Filled 2020-12-26: qty 804

## 2020-12-26 NOTE — Progress Notes (Signed)
Daily Progress Note   Patient Name: Ronald Wolf       Date: 12/26/2020 DOB: 11/18/1946  Age: 75 y.o. MRN#: [id number] Attending Physician: Margaretha Seeds, MD Primary Care Physician: Mosie Lukes, MD Admit Date: 12/01/2020  Reason for Consultation/Follow-up: To discuss complex medical decision making related to patient's goals of care  Patient more alert today than when I last saw him.  Has been on the ventilator.  He is also diaphoretic.  Continued high ileostomy output that will not improve due to short gut syndrome.  Remains on TPN.  Note PT consult due to concern for possible contractures.  Subjective: Spoke with daughter [name].  Asked if she would like to have a Palliative Care meeting anytime this weekend.  [place]onda expressed concern over the forecasted snowstorm and expressed doubt that the family could travel to the hospital.  We talked about [name] being put back on the vent.  This has temporarily improved his level of alertness.  We also discussed the concern over developing contractures.  Ulonda expressed that she and her Aunt would like [name] to go to a facility for 1 month.  Then they can make decisions about the future.  [name] also expressed that she wants very much to bring him home.  We discussed whether she would be bringing him home to have days to weeks and pass away or would she be bringing him home with the expectation that he would survive.  She responded "both".  Which I understood.   I expressed that I believe it may be possible to bring him home for a few days anticipating that he would enjoy being and home with hospice prior to passing away.     I committed to investigate (1) what are the barriers to discharge to LTAC if any and (2) what would be  required in order to arrange DC to home.   Assessment: Patient with VDRF but presently weaning, short gut syndrome on TPN, advanced dementia, profoundly deconditioned/bedbound and completely dependent.   Patient Profile/HPI:  75 y.o. male  with past medical history of bladder cancer s/p urostomy, seizure, disorder, right glottic cancer s/p excision, atrial fibrillation, tobacco abuse, CVA, hepatitis C, anxiety and depression who was admitted on 12/12/2020 with small bowel obstruction.  He had left AMA from [facility].  Initially he was treated with conservative measures but on 12/7 he underwent exploratory laproscopy and part of his small bowel was resected. He was taken back to the OR on 12/12 for further bowel resection.   On 12/15 he had abdominal closure and ileostomy placement.  During this time he has suffered from respiratory failure and been intubated 3 times.   Currently he is not weaning from the vent and vent settings are too high to allow for tracheostomy placement.  Patient is quite cachectic and remains on TPN.  He opens his eyes to voice and touch but does not respond to me otherwise.   Length of Stay: 44   Vital Signs: BP 132/69   Pulse 93   Temp 98.5 F (36.9 C) (Axillary)   Resp 18   Ht 5\' 11"  (1.803 m)   Wt 64 kg   SpO2 97%   BMI 19.68 kg/m  SpO2: SpO2: 97 % O2 Device: O2 Device: Tracheostomy Collar O2 Flow Rate: O2 Flow Rate (L/min): 8 L/min       Palliative Assessment/Data: 10%     Palliative Care Plan    Recommendations/Plan:   Family wants him to go to LTAC.  Family not ready to shift to comfort measures.  PMT Recommends that we do not offer futile therapy or therapy that will not provide long term improvement.  PMT Recommends that we do not escalate care.  Will discuss with TOC  PMT will continue to follow with you.  Code Status:  Limited code  Prognosis:  weeks  Discharge Planning:  LTAC   Care plan was discussed with CCM NP  Laurey Arrow, ICU RN, Daughter [name]  Thank you for allowing the Palliative Medicine Team to assist in the care of this patient.  Total time spent:  35 min.     Greater than 50%  of this time was spent counseling and coordinating care related to the above assessment and plan.  Florentina Jenny, PA-C Palliative Medicine  Please contact Palliative MedicineTeam phone at [phone] for questions and concerns between 7 am - 7 pm.   Please see AMION for individual provider pager numbers.

## 2020-12-26 NOTE — Progress Notes (Signed)
PHARMACY - TOTAL PARENTERAL NUTRITION CONSULT NOTE  Indication: Small bowel obstruction, prolonged ileus  Patient Measurements: Height: 5\' 11"  (180.3 cm) Weight: 61.2 kg (134 lb 14.7 oz) IBW/kg (Calculated) : 75.3 TPN AdjBW (KG): 60.8 Body mass index is 18.82 kg/m. Usual Weight: 137 lbs  Assessment: 74 YOM presented 12/01/2020 with nausea, vomiting, abdominal pain after leaving AMA at [facility] with known SBO. Patient has been on bowel rest with NG tube decompression. Patient continued to have persistent SBO on imaging with no evidence of improvement, now s/p ex-lap with LoA, SBR and placement of Gtube on 12/05/2020. Pharmacy consulted for TPN management - patient received TPN 11/19/20 through 11/29/20.  Reconsulted to resume TPN on 12/2. TF currently at 30 mL/hr given high ostomy output and concern for tube feeds not absorbing. Patient on loperamide, lomotil, tincture of opium, morphine sulfate, octreotide and fiber without improvement in output.   Glucose / Insulin: no hx DM - CBGs mostly <180  - utilized 18 units mSSI/24hrs Electrolytes: Na 137>139 (FW adjusted to 24ml PT q8h), K 3.5>3.2, CO2 improved to 26, phos 4.1>3.4, mag 2.3; CorrCa 10.3, others WNL Renal: Scr up to 2.39, BUN up to 178 LFTs / TGs: LFTs / TG wnl, Tbili up to 1.6 Prealbumin / albumin: Prealbumin 17.9 > 12.5, albumin 1.6 Intake / Output; MIVF: UOP 1.6 ml/kg/hr, ileostomy output/24h: 1347mL (on fiber, opium tincture, lomotil, loperamide, morphine sulfate, octreotide and unlikely to improve per Surgery), drain O/P none charted, net +17.7L GI Imaging: 12/11 DG Abd - stomach gas-filled, without distention, no obvious free air 12/23 CT abd pelvis - large loculated collection in low abd/pelvis, few foci of internal gas present, new RUQ ileostomy, thickened distal SB/colon, subtotal atelectatic collapse of LLL, partial collapse of RLL, small R pleural effusion, body wall edema Surgeries / Procedures: 12/12: re-exploration and washout  with new copious drainage from incision concerning for enteric leak vs. enterotomy >> now s/p, bowel necrosis resected 12/15: ex-lap with ileostomy with fistula creation, abdominal closure 12/24 Pelvic abscess drain 12/29 - trach placed  Central access: PICC placed 11/14/2020 TPN start date: 11/19/20>>11/29/20; restart 12/03/20 >>  Nutritional Goals (updated per RD rec 12/30): 2300-2700 kCal, 120-135g protein, >2L fluid per day  Goal TPN rate of 75 cc/hr will provide 120g AA, 432g CHO, and 36g lipids (15% of total kCal due to [employer]) for a total of 2311 kcal, meeting 100% of needs  Current Nutrition:  TPN Vital AF at 20 ml/hr (goal rate 65 ml/hr) - not advancing rate d/t ileostomy output [employer] TF 45 ml TID (each 11g AA and 40 kcal)  Plan:  Continue concentrated TPN at goal rate of 75 ml/hr at 1800. TPN will provide 120g protein, 432g CHO, 36g SMOF lipids (~15% of total kcal due to national shortage), and 2311 kcal - meeting 100% of estimated needs. Electrolytes in TPN: Continue Na at 70 mEq/L, Increase K to 56mEq/L, Continue Ca 4 mEq/L, remove Mag, Add back 5 mmol/L Phos, Adjust Cl:Ac to 1:2  MD repleted K with 4 IV runs and 20 mEq per tube x 2  Continue FW 253ml PT q8h per MD Add standard MVI and trace elements + folic acid to TPN Continue moderate SSI q4h and adjust as needed Monitor TPN labs Per surgery, patient is likely to be TPN-dependent. F/u long-term plans, GOC discussions   Albertina Parr, PharmD., BCPS, BCCCP Clinical Pharmacist Please refer to [facility] for unit-specific pharmacist

## 2020-12-26 NOTE — Progress Notes (Signed)
Tube feeds placed on hold during CPT.

## 2020-12-26 NOTE — Progress Notes (Signed)
RT NOTE:  Pt put back on vent due to increased RR and desaturation.

## 2020-12-26 NOTE — Progress Notes (Signed)
   12/26/20 1230  Vent Select  Invasive or Noninvasive Invasive  Adult Vent (S)   (Standby, placed on ATC 35% per protocol.)

## 2020-12-26 NOTE — Progress Notes (Signed)
NAME:  Ronald Wolf, MRN:  [id number], DOB:  [birth year], LOS: 43 ADMISSION DATE:  12/02/2020, CONSULTATION DATE:  12/7 REFERRING MD:  Ronald Wolf, CHIEF COMPLAINT:  Abdominal pain   Brief History:  Ronald Wolf is a 75 yo M w/ PMH of CVA, Seizures, Dementia, Bladder ca s/p urostomy, tobacco use, right glottic carcinoma s/p excision presenting with sbo. Found to have ischemic bowel requiring ex-lap 12/7.  Course complicated by septic shock, acute respiratory failure and AKI return to the OR 12/12 for necrotic small bowel resection of additional 70 cm inclusive of prior anastomosis 12/15 abdominal closure with ileostomy He has had a prolonged ICU stay with ventilator dependence, had a tracheostomy on 12/29  Past Medical History:  CVA, Dementia, Bladder CA s/p nephrostomy, tobacco use, right glottic carcinoma s/p excision, seizures.  Significant Hospital Events:  12/11/2020 Admission 12/09/2020 OR 12/09/2020 Admit to ICU 12/10 Brief SVT with associated hypertension. Self limited Given fentanyl, metop and hydral for HTN 12/11 Good urine output with Lasix , failed extubation, reintubated within 2 hours due to inability to clear secretions 12/12 return OR for enteric leak-- found to have bowel necrosis. Left in discontinuity  12/13 self extubated, reintubated. On pressors. Family meeting with PCCM and CCS to discuss possible next steps.  12/15-was back in OR, abdominal closure, RUQ mucus fistula and L ileostomy  12/19: No significant overnight events, high ileostomy output 12/21: scheduled for family meeting. Worse hypernatremia, continued high ileostomy output.  Attempted PSV/CPAP wean, pulled volumes of high 100s -low/mid 200s with tachypnea. Placed back of full support.  12/22 ongoing high output GI/ ileostomy, TPN started; weaned 12/5 all day; off fentanyl gtt, remains on precedex  12/24 IR drain , 1 unit PRBC transfusion for hemoglobin 6.5 12/26 added vancomycin for GPC in blood cultures and RIC suggesting  MRSA 12/29 tracheostomy 12/31 Palliative discussion > family understanding that his situation is critical and he has not made much improvement  1/6 ongoing palliative conversations. No progress from pt standpoint. 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  1/11 seen by surgery, IR consulted to remove percutaneous drain.  Added octreotide in effort to try to decrease output 1/12: More fatigued back on vent 1/14: Opens eyes to voice and grimaces to pain, ?tracking. Severely deconditioned and suspect long recovery requiring vent dependence at this point. Reconsult PT for developing contractures Consults:  General Surgery PCCM Palliative Care  Procedures:  12/08/2020 Ex-lap, G-tube placement 12/7 PICC >> ETT 12/7 >> 12/11, >> 12/15- back to OR for abdominal closure, exploratory laparotomy  12/24 CT-guided right pelvic abscess drain 12/29 trach  Significant Diagnostic Tests:  CT abd/pelvis 12/2 > High-grade distal small bowel obstruction, possibly due to adhesion. Mild ascites and diffuse mesenteric edema. No evidence of focal inflammatory process or abscess. Small bilateral pleural effusions and mild right lower lobe Atelectasis. CT abdomen/pelvis 12/23 Interval development of a large loculated collection in the low anterior abdomen and pelvis measuring approximately 6.1 x 11.3 x12.1 cm in size subjacent to the operative site. Few foci of internal gas are present. , no frank spillage of enteric contrast   CT H 1/6 > no acute intracranial abnormality. Chronic L MCA changes from prior  L MCA infarct. Atrophic and chronic white matter ischemic changes.  Stable L basal ganglia infarcts   CT abd pelvis 1/10: pelvis abscess drained. BLL effusions w/ atx. Expected post op changes  Micro Data:  12/06/2020 COVID/Flu negative 12/7 BA: Few GNR rare GPC -- predominantly PMN  12/7 BCx> Neg 12/11 respiratory -no organism in tracheal aspirate 12/24 BC >> RIC showing both staph epidermis 1/4 12/24 abscess >> E. Faecalis, B fragilis (B lactamase) 12/27 trach aspirate>> normal flora 1/8  Trach Achromobacter xylosoxidans and E.coli 1/8 BCX> Neg  Antimicrobials:  Fluc 12/8>> 12/10 Zosyn 12/8> 12/12 cefotetain 12/15>> off Zosyn 12/23 >> 12/30 eraxis 12/24 >> 1/2 Vancomycin 12/26 >>12/31 Unasyn 12/31 >1/9 zosyn 1/9>> vanc 1/9 >>off  Interim History / Subjective:  S/p drain removal with IR  Opens eyes to voice and grimaces to pain, ?tracking. On minimal vent settings. Tachypneic on  PS Objective   Blood pressure (!) 128/55, pulse 95, temperature 98.9 F (37.2 C), temperature source Axillary, resp. rate (!) 22, height 5' 11"  (1.803 m), weight 64 kg, SpO2 98 %.    Vent Mode: PRVC FiO2 (%):  [30 %] 30 % Set Rate:  [14 bmp] 14 bmp Vt Set:  [600 mL] 600 mL PEEP:  [5 cmH20] 5 cmH20 Plateau Pressure:  [23 cmH20-28 cmH20] 28 cmH20   Intake/Output Summary (Last 24 hours) at 12/26/2020 0725 Last data filed at 12/26/2020 0700 Gross per 24 hour  Intake 3275.52 ml  Output 3075 ml  Net 200.52 ml   Filed Weights   12/19/20 0431 12/23/20 0301 12/26/20 0500  Weight: 64.1 kg 64.1 kg 64 kg   Physical Exam: General: Chronically ill-appearing, no acute distress HENT: [place], AT, OP clear, MMM Eyes: EOMI, no scleral icterus Respiratory: Diminished breath sounds bilaterally.  No crackles, wheezing or rales Cardiovascular: RRR, -M/R/G, no JVD GI: BS+, soft, nontender, ostomy in place Extremities: Anasarca, -tenderness, upper extremity contractures Neuro: Opens eyes to voice and grimaces to pain, ?tracking.   Resolved Hospital Problem list   HCAP  Assessment & Plan:   Ischemic bowel  perf s/p ex-lap x 2 for resection, mucus fistula ileostomy, G tube placement c/b Intra-abdominal abscess s/p drain removal 1/13 Plan Will continue IV Zosyn, with plan to extend this 5 days post drain removal  Trend CBC  Severe protein calorie malnutrition High ileostomy output, enteral malabsorption -->short gut syndrome  F/U CT abd/pelvis w/ what looks like resolution of pelvic access -CCS following Really just not absorbing any nutrition Plan Continue trophic feeds  Continue TPN, however he is not a good candidate for long-term here  Continuing Imodium, morphine, octreotide.  Really need extensive goals of care   Trach/Vent dependence - not a candidate for PS Bilateral pleural effusions due to malnutrition  Trach bleeding due to suction trauma Severe protein calorie malnutrition, deconditioning and overall failure to thrive resulting in very poor stamina and cough mechanics Continues to be vent dependent. Expect slow wean Plan Continue ventilator as needed, continue to attempt aerosol trach collar daily Diurese VAP bundle  Ecoli and achromobacter xylosoxidans in sputum -suspect the xylosoxidans is colonization -S/p >5d Zosyn Plan Trend CBC  AKI due to intravascular volume contraction from third space losses, hypoalbuminemia, Hypokalemia, Hyponatremia, Anion gap metabolic acidosis  -> Renal function fairly stable even with attempted diuresis Plan Repeat lasix in effort to mobilize fluid given his significant anasarca Replete K Trend BMP, Mg  Acute encephalopathy, multifactorial Prior L MCA CVA -New R sided facial droop/ flaccid RUE tone 1/6,  CT head negative Plan Holding Plavix and aspirin due to history of bleeding  Hypertension, uncontrolled Tachybradycardia syndrome Plan Continue telemetry monitoring Avoiding beta-blockade  Atrial fibrillation with RVR> resolved -Not on anticoagulation because of recent surgery Plan Telemetry  Seizures well  controlled Plan Keppra  Bladder Cancer Also has history of penis prosthetic Given his severe anasarca urology did evaluate the patient on 1/12 to ensure prosthetic did not appear infected they felt no evidence of infection Plan Continue routine urostomy care Keep penis and testicles elevated Try to maximize nutrition  Anemia, no obvious source of ongoing blood loss, hemoglobin dropped from 7.2-5.6 >> improved to 8.5 with 2 units PRBC 1/8 Repeat hemoglobin 1/9 again dropped to 6.2>> 7.7 on repeat Plan CBC now  Goals of Care Ongoing GOC convos (many interdisciplinary meetings, now being followed by palliative care) Palliative care working with family   Daily Goals Checklist  Pain/Anxiety/Delirium protocol (if indicated): enteral morphine only.  VAP protocol (if indicated): bundle in place Respiratory support goals: trach collar trials today.  Blood pressure target: SBP<160 DVT prophylaxis:  subcutaneous heparin Nutrition Status: severe malnutrition, continue TPN and try elemental feeds GI prophylaxis: pantoprazole Fluid status goals: euvolemia 1/11 Urinary catheter: Guide hemodynamic management Central lines: PICC Glucose control: sliding scale Mobility/therapy needs: bedrest Antibiotic de-escalation: Zosyn. Home medication reconciliation: on hold  Daily labs: CBC, BMP Code Status: No CPR but continue aggressive care otherwise  Family Communication: Last updated 1/8 Disposition: ICU  Goals of Care:  Last date of multidisciplinary goals of care discussion: 1/4. On-going w/ family.  Family and staff present: Patient's daughter Ms. Alveta Heimlich Summary of discussion:  1/5- Palliative care met with family - -desired for ongoing aggressive medical care as offered, limited code blue Code Status: limited code blue no shock, no CPR  The patient is critically ill with multiple organ systems failure and requires high complexity decision making for assessment and support, frequent  evaluation and titration of therapies, application of advanced monitoring technologies and extensive interpretation of multiple databases.  Independent Critical Care Time: 31 Minutes.   Rodman Pickle, M.D. [facility] Pulmonary/Critical Care Medicine 12/26/2020 7:25 AM   Please see Amion for pager number to reach on-call Pulmonary and Critical Care Team.

## 2020-12-26 NOTE — Plan of Care (Signed)
  Problem: Clinical Measurements: Goal: Ability to maintain clinical measurements within normal limits will improve Outcome: Progressing Goal: Will remain free from infection Outcome: Progressing Goal: Diagnostic test results will improve Outcome: Progressing Goal: Respiratory complications will improve Outcome: Progressing Goal: Cardiovascular complication will be avoided Outcome: Progressing   Problem: Nutrition: Goal: Adequate nutrition will be maintained Outcome: Progressing   Problem: Coping: Goal: Level of anxiety will decrease Outcome: Progressing   Problem: Elimination: Goal: Will not experience complications related to bowel motility Outcome: Progressing   Problem: Pain Managment: Goal: General experience of comfort will improve Outcome: Progressing   Problem: Safety: Goal: Ability to remain free from injury will improve Outcome: Progressing

## 2020-12-26 NOTE — Progress Notes (Signed)
[facility] Progress Note  30 Days Post-Op  Subjective: CC-  No acute events over night. IR drain injection study yesterday negative for fistula to bowel, drain was removed.  Objective: Vital signs in last 24 hours: Temp:  [98.9 F (37.2 C)-99.9 F (37.7 C)] 98.9 F (37.2 C) (01/14 0400) Pulse Rate:  [81-104] 95 (01/14 0700) Resp:  [15-26] 22 (01/14 0500) BP: (122-150)/(50-84) 128/55 (01/14 0700) SpO2:  [95 %-100 %] 97 % (01/14 0750) FiO2 (%):  [30 %] 30 % (01/14 0750) Weight:  [64 kg] 64 kg (01/14 0500) Last BM Date: 12/25/20  Intake/Output from previous day: 01/13 0701 - 01/14 0700 In: 3275.5 [I.V.:1789.2; NG/GT:1340; IV Piggyback:126.3] Out: 7425 [Urine:1650; ZDGLO:7564] Intake/Output this shift: No intake/output data recorded.  PE: PPI:RJJOACZYSA ill appearing HEENT: trach in place Card: RRR Pulm:mechanically ventilated YTK:ZSWF, ND, NT,openmidline woundwith fibrinous exudate and some tissue necrosis but fascia intact/ sutures visible,RUQ stoma (mucus fistula)viable withscant blood-tinged/ tanoutput, LLQ stoma (end ileostomy)pinkwithTF-appearingoutput, urostomy productive, g-tubesite c/dwith TF running at20cc/hr Neuro:opens eyes briefly but does not respond orfollow commands  Skin: warm and dry  Lab Results:  No results for input(s): WBC, HGB, HCT, PLT in the last 72 hours. BMET Recent Labs    12/25/20 0358 12/26/20 0459  NA 137 139  K 3.5 3.2*  CL 97* 97*  CO2 23 26  GLUCOSE 195* 203*  BUN 164* 178*  CREATININE 2.20* 2.39*  CALCIUM 8.8* 8.8*   PT/INR No results for input(s): LABPROT, INR in the last 72 hours. CMP     Component Value Date/Time   NA 139 12/26/2020 0459   NA 145 03/08/2016 1356   K 3.2 (L) 12/26/2020 0459   K 4.5 03/08/2016 1356   CL 97 (L) 12/26/2020 0459   CO2 26 12/26/2020 0459   CO2 28 03/08/2016 1356   GLUCOSE 203 (H) 12/26/2020 0459   GLUCOSE 98 03/08/2016 1356   BUN 178 (H) 12/26/2020  0459   BUN 14.2 03/08/2016 1356   CREATININE 2.39 (H) 12/26/2020 0459   CREATININE 0.9 03/08/2016 1356   CALCIUM 8.8 (L) 12/26/2020 0459   CALCIUM 9.8 03/08/2016 1356   PROT 6.5 12/25/2020 0358   PROT 8.2 03/08/2016 1356   ALBUMIN 1.6 (L) 12/25/2020 0358   ALBUMIN 3.6 03/08/2016 1356   AST 38 12/25/2020 0358   AST 42 (H) 03/08/2016 1356   ALT 33 12/25/2020 0358   ALT 43 03/08/2016 1356   ALKPHOS 123 12/25/2020 0358   ALKPHOS 79 03/08/2016 1356   BILITOT 1.6 (H) 12/25/2020 0358   BILITOT 0.74 03/08/2016 1356   GFRNONAA 28 (L) 12/26/2020 0459   GFRAA >60 12/31/2019 0221   Lipase     Component Value Date/Time   LIPASE 57 (H) 11/20/2020 1830       Studies/Results: IR Sinus/Fist Tube Chk-Non GI  Result Date: 12/25/2020 INDICATION: Anterior pelvic abscess drain EXAM: Fluoroscopic drain injection followed by removal MEDICATIONS: The patient is currently admitted to the hospital and receiving intravenous antibiotics. The antibiotics were administered within an appropriate time frame prior to the initiation of the procedure. ANESTHESIA/SEDATION: Moderate Sedation Time: The patient was continuously monitored during the procedure by the interventional radiology nurse under my direct supervision. COMPLICATIONS: None. PROCEDURE: A timeout was performed prior to the initiation of the procedure. Anterior lower pelvic abscess drain was injected with contrast. Contrast spread throughout the prevesical space without fistula connection to bowel. Cavity was decompressed by syringe aspiration. Majority of the contrast was aspirated. Drain catheter cut and removed.  IMPRESSION: Collapsed abscess cavity. Negative for fistula to bowel. Drain catheter removed. Electronically Signed   By: Jerilynn Mages.  Shick M.D.   On: 12/25/2020 16:26    Anti-infectives: Anti-infectives (From admission, onward)   Start     Dose/Rate Route Frequency Ordered Stop   12/21/20 1130  vancomycin (VANCOREADY) IVPB 1250 mg/250 mL  Status:   Discontinued        1,250 mg 166.7 mL/hr over 90 Minutes Intravenous Every 48 hours 12/21/20 1032 12/22/20 0848   12/21/20 1115  piperacillin-tazobactam (ZOSYN) IVPB 3.375 g        3.375 g 12.5 mL/hr over 240 Minutes Intravenous Every 8 hours 12/21/20 1022     12/12/20 1800  Ampicillin-Sulbactam (UNASYN) 3 g in sodium chloride 0.9 % 100 mL IVPB  Status:  Discontinued        3 g 200 mL/hr over 30 Minutes Intravenous Every 6 hours 12/12/20 1101 12/21/20 1022   12/08/20 1300  vancomycin (VANCOCIN) IVPB 1000 mg/200 mL premix  Status:  Discontinued        1,000 mg 200 mL/hr over 60 Minutes Intravenous Every 24 hours 12/08/20 0845 12/12/20 1059   12/08/20 0100  vancomycin (VANCOREADY) IVPB 500 mg/100 mL  Status:  Discontinued        500 mg 100 mL/hr over 60 Minutes Intravenous Every 12 hours 12/07/20 1130 12/07/20 1337   12/07/20 1215  vancomycin (VANCOREADY) IVPB 1250 mg/250 mL        1,250 mg 166.7 mL/hr over 90 Minutes Intravenous  Once 12/07/20 1124 12/07/20 1336   12/06/20 1030  anidulafungin (ERAXIS) 100 mg in sodium chloride 0.9 % 100 mL IVPB  Status:  Discontinued       "Followed by" Linked Group Details   100 mg 78 mL/hr over 100 Minutes Intravenous Every 24 hours 12/05/20 0939 12/13/20 1230   12/05/20 1030  anidulafungin (ERAXIS) 200 mg in sodium chloride 0.9 % 200 mL IVPB       "Followed by" Linked Group Details   200 mg 78 mL/hr over 200 Minutes Intravenous  Once 12/05/20 0939 12/05/20 1618   12/04/20 1500  piperacillin-tazobactam (ZOSYN) IVPB 3.375 g  Status:  Discontinued        3.375 g 12.5 mL/hr over 240 Minutes Intravenous Every 8 hours 12/04/20 0818 12/12/20 1059   12/04/20 0915  piperacillin-tazobactam (ZOSYN) IVPB 3.375 g        3.375 g 100 mL/hr over 30 Minutes Intravenous  Once 12/04/20 0818 12/04/20 1207   12/01/2020 0945  cefoTEtan (CEFOTAN) 2 g in sodium chloride 0.9 % 100 mL IVPB  Status:  Discontinued        2 g 200 mL/hr over 30 Minutes Intravenous To Surgery  12/10/2020 0936 11/27/2020 1057   11/27/2020 0900  cefoTEtan (CEFOTAN) 2 g in sodium chloride 0.9 % 100 mL IVPB  Status:  Discontinued        2 g 200 mL/hr over 30 Minutes Intravenous  Once 11/24/2020 0833 11/16/2020 0936   11/19/20 1130  piperacillin-tazobactam (ZOSYN) IVPB 3.375 g        3.375 g 12.5 mL/hr over 240 Minutes Intravenous Every 8 hours 11/19/20 1017 19-Dec-2020 2359   11/19/20 1115  fluconazole (DIFLUCAN) IVPB 200 mg        200 mg 100 mL/hr over 60 Minutes Intravenous Every 24 hours 11/19/20 1017 11/21/20 1104   11/19/20 0915  Ampicillin-Sulbactam (UNASYN) 3 g in sodium chloride 0.9 % 100 mL IVPB  Status:  Discontinued  3 g 200 mL/hr over 30 Minutes Intravenous Every 12 hours 11/19/20 0826 11/19/20 0949       Assessment/Plan SBO -s/p ex lap with SBR, placement of a gastrostomy tube12/7 by Dr. Georgette Dover. Takeback 12/12 by Dr. Zenia Resides for necrotic SB with resection of additional 60-70cm of SB inclusive of prior anastomosis in discontinuity with open abdomen. - S/P ex lap, ileostomy, mucous fistula, closure 12/15 by Dr. Grandville Silos Malnourishment/FEN-Continue TNA.Patient is really not absorbing any enteral nutrition, keep TFfeedsat 20 given high ileostomy output. Continue fiber,imodium, lomotil, tincture of opium. Poor candidate for long term TNA VDRF-s/p tracheostomy 12/29 ID-currently zosyn 1/9>> (for E coli and Achromobacter xylosoxidans in Rcx, CCM also plans to continue this 5 days after IR drain removal 1/13). vancomycin 1/9>>1/10. unasyn 12/31>>1/9 Intra-abdominal abscess - seen on CT 12/23,s/p perc drain by IR 12/24. Culture with Enterococcus faecalis and Bacteroides fragilis, beta lactamase positive.CT 1/9 showed no evidence of significant residual fluid collection and no new fluid collections. IR drain injection study negative therefore drain removed 1/13 CV/AF RVR- in NSRcurrently  VTE-SQH Dispo - CBC pending. ICU. TNA and TF at 20cc/hr as above. Continue [place]  discussions.   LOS: 44 days    [facility] 12/26/2020, 8:24 AM Please see Amion for pager number during day hours 7:00am-4:30pm

## 2020-12-26 NOTE — Progress Notes (Signed)
   12/26/20 0750  Vent Select  Invasive or Noninvasive Invasive  Adult Vent Y  Adult Ventilator Settings  Vent Mode (S)  CPAP;PSV  FiO2 (%) (S)  30 %  Pressure Support (S)  12 cmH20  PEEP (S)  5 cmH20  Adult Ventilator Measurements  Peak Airway Pressure 17 L/min  Mean Airway Pressure 7 cmH20  Resp Rate Spontaneous 23 br/min  Spont TV 458 mL  Measured Ve 12.6 mL  Total PEEP 5 cmH20  SpO2 97 %  Adult Ventilator Alarms  Alarms On Y  Ve High Alarm 18 L/min  Ve Low Alarm 4 L/min  Resp Rate High Alarm 38 br/min  Resp Rate Low Alarm 8  PEEP Low Alarm 3 cmH2O  Press High Alarm 50 cmH2O  T Apnea 20 sec(s)  PSV trials.

## 2020-12-27 DIAGNOSIS — Z8551 Personal history of malignant neoplasm of bladder: Secondary | ICD-10-CM | POA: Diagnosis not present

## 2020-12-27 DIAGNOSIS — Z8673 Personal history of transient ischemic attack (TIA), and cerebral infarction without residual deficits: Secondary | ICD-10-CM | POA: Diagnosis not present

## 2020-12-27 DIAGNOSIS — K56609 Unspecified intestinal obstruction, unspecified as to partial versus complete obstruction: Secondary | ICD-10-CM | POA: Diagnosis not present

## 2020-12-27 DIAGNOSIS — G9340 Encephalopathy, unspecified: Secondary | ICD-10-CM | POA: Diagnosis not present

## 2020-12-27 DIAGNOSIS — N179 Acute kidney failure, unspecified: Secondary | ICD-10-CM | POA: Diagnosis not present

## 2020-12-27 DIAGNOSIS — J9601 Acute respiratory failure with hypoxia: Secondary | ICD-10-CM | POA: Diagnosis not present

## 2020-12-27 LAB — GLUCOSE, CAPILLARY
Glucose-Capillary: 116 mg/dL — ABNORMAL HIGH (ref 70–99)
Glucose-Capillary: 132 mg/dL — ABNORMAL HIGH (ref 70–99)
Glucose-Capillary: 191 mg/dL — ABNORMAL HIGH (ref 70–99)
Glucose-Capillary: 206 mg/dL — ABNORMAL HIGH (ref 70–99)
Glucose-Capillary: 215 mg/dL — ABNORMAL HIGH (ref 70–99)
Glucose-Capillary: 230 mg/dL — ABNORMAL HIGH (ref 70–99)

## 2020-12-27 LAB — CBC
HCT: 25.2 % — ABNORMAL LOW (ref 39.0–52.0)
Hemoglobin: 8 g/dL — ABNORMAL LOW (ref 13.0–17.0)
MCH: 30.2 pg (ref 26.0–34.0)
MCHC: 31.7 g/dL (ref 30.0–36.0)
MCV: 95.1 fL (ref 80.0–100.0)
Platelets: 268 10*3/uL (ref 150–400)
RBC: 2.65 MIL/uL — ABNORMAL LOW (ref 4.22–5.81)
RDW: 19.7 % — ABNORMAL HIGH (ref 11.5–15.5)
WBC: 17.7 10*3/uL — ABNORMAL HIGH (ref 4.0–10.5)
nRBC: 0.2 % (ref 0.0–0.2)

## 2020-12-27 LAB — COMPREHENSIVE METABOLIC PANEL
ALT: 37 U/L (ref 0–44)
AST: 44 U/L — ABNORMAL HIGH (ref 15–41)
Albumin: 1.4 g/dL — ABNORMAL LOW (ref 3.5–5.0)
Alkaline Phosphatase: 136 U/L — ABNORMAL HIGH (ref 38–126)
Anion gap: 16 — ABNORMAL HIGH (ref 5–15)
BUN: 165 mg/dL — ABNORMAL HIGH (ref 8–23)
CO2: 25 mmol/L (ref 22–32)
Calcium: 8.9 mg/dL (ref 8.9–10.3)
Chloride: 103 mmol/L (ref 98–111)
Creatinine, Ser: 2.16 mg/dL — ABNORMAL HIGH (ref 0.61–1.24)
GFR, Estimated: 31 mL/min — ABNORMAL LOW (ref >60)
Glucose, Bld: 122 mg/dL — ABNORMAL HIGH (ref 70–99)
Potassium: 3.8 mmol/L (ref 3.5–5.1)
Sodium: 144 mmol/L (ref 135–145)
Total Bilirubin: 1.3 mg/dL — ABNORMAL HIGH (ref 0.3–1.2)
Total Protein: 6.5 g/dL (ref 6.5–8.1)

## 2020-12-27 LAB — PHOSPHORUS: Phosphorus: 3 mg/dL (ref 2.5–4.6)

## 2020-12-27 MED ORDER — FUROSEMIDE 10 MG/ML IJ SOLN
40.0000 mg | Freq: Once | INTRAMUSCULAR | Status: AC
  Administered 2020-12-27: 40 mg via INTRAVENOUS
  Filled 2020-12-27: qty 4

## 2020-12-27 MED ORDER — TRACE MINERALS CU-MN-SE-ZN 300-55-60-3000 MCG/ML IV SOLN
INTRAVENOUS | Status: AC
  Filled 2020-12-27: qty 804

## 2020-12-27 MED ORDER — POTASSIUM CHLORIDE 20 MEQ PO PACK
40.0000 meq | PACK | Freq: Once | ORAL | Status: AC
  Administered 2020-12-27: 40 meq
  Filled 2020-12-27: qty 2

## 2020-12-27 NOTE — Plan of Care (Signed)
  Problem: Clinical Measurements: Goal: Will remain free from infection Outcome: Progressing Goal: Diagnostic test results will improve Outcome: Progressing Goal: Respiratory complications will improve Outcome: Progressing Goal: Cardiovascular complication will be avoided Outcome: Progressing   Problem: Coping: Goal: Level of anxiety will decrease Outcome: Progressing   Problem: Pain Managment: Goal: General experience of comfort will improve Outcome: Progressing   Problem: Safety: Goal: Ability to remain free from injury will improve Outcome: Progressing   

## 2020-12-27 NOTE — Progress Notes (Signed)
NAME:  Ronald Wolf, MRN:  [id number], DOB:  1946/12/10, LOS: 45 ADMISSION DATE:  11/13/2020, CONSULTATION DATE:  12/7 REFERRING MD:  Cyndia Skeeters, CHIEF COMPLAINT:  Abdominal pain   Brief History:  [name] is a 75 yo M w/ PMH of CVA, Seizures, Dementia, Bladder ca s/p urostomy, tobacco use, right glottic carcinoma s/p excision presenting with sbo. Found to have ischemic bowel requiring ex-lap 12/7.  Course complicated by septic shock, acute respiratory failure and AKI return to the OR 12/12 for necrotic small bowel resection of additional 70 cm inclusive of prior anastomosis 12/15 abdominal closure with ileostomy He has had a prolonged ICU stay with ventilator dependence, had a tracheostomy on 12/29  Past Medical History:  CVA, Dementia, Bladder CA s/p nephrostomy, tobacco use, right glottic carcinoma s/p excision, seizures.  Significant Hospital Events:  11/17/2020 Admission 11/16/2020 OR 11/22/2020 Admit to ICU 12/10 Brief SVT with associated hypertension. Self limited Given fentanyl, metop and hydral for HTN 12/11 Good urine output with Lasix , failed extubation, reintubated within 2 hours due to inability to clear secretions 12/12 return OR for enteric leak-- found to have bowel necrosis. Left in discontinuity  12/13 self extubated, reintubated. On pressors. Family meeting with PCCM and CCS to discuss possible next steps.  12/15-was back in OR, abdominal closure, RUQ mucus fistula and L ileostomy  12/19: No significant overnight events, high ileostomy output 12/21: scheduled for family meeting. Worse hypernatremia, continued high ileostomy output.  Attempted PSV/CPAP wean, pulled volumes of high 100s -low/mid 200s with tachypnea. Placed back of full support.  12/22 ongoing high output GI/ ileostomy, TPN started; weaned 12/5 all day; off fentanyl gtt, remains on precedex  12/24 IR drain , 1 unit PRBC transfusion for hemoglobin 6.5 12/26 added vancomycin for GPC in blood cultures and RIC suggesting  MRSA 12/29 tracheostomy 12/31 Palliative discussion > family understanding that his situation is critical and he has not made much improvement  1/6 ongoing palliative conversations. No progress from pt standpoint. 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  1/11 seen by surgery, IR consulted to remove percutaneous drain.  Added octreotide in effort to try to decrease output 1/12: More fatigued back on vent 1/14: Opens eyes to voice and grimaces to pain, ?tracking. Severely deconditioned and suspect long recovery requiring vent dependence at this point. Reconsult PT for developing contractures Consults:  General Surgery PCCM Palliative Care  Procedures:  11/25/2020 Ex-lap, G-tube placement 12/7 PICC >> ETT 12/7 >> 12/11, >> 12/15- back to OR for abdominal closure, exploratory laparotomy  12/24 CT-guided right pelvic abscess drain 12/29 trach  Significant Diagnostic Tests:  CT abd/pelvis 12/2 > High-grade distal small bowel obstruction, possibly due to adhesion. Mild ascites and diffuse mesenteric edema. No evidence of focal inflammatory process or abscess. Small bilateral pleural effusions and mild right lower lobe Atelectasis. CT abdomen/pelvis 12/23 Interval development of a large loculated collection in the low anterior abdomen and pelvis measuring approximately 6.1 x 11.3 x12.1 cm in size subjacent to the operative site. Few foci of internal gas are present. , no frank spillage of enteric contrast  CT A/P 1/9 Success drainage of fluid of pelvic fluid collection, bilateral pleural effusions  CT H 1/6 > no acute intracranial abnormality. Chronic L MCA changes from prior L MCA  infarct. Atrophic and chronic white matter ischemic changes.  Stable L basal ganglia infarcts  CT abd pelvis 1/10: pelvis abscess drained. BLL effusions w/ atx. Expected post op changes  Micro Data:  12/06/2020 COVID/Flu negative 12/7 BA: Few GNR rare GPC -- predominantly PMN  12/7 BCx> Neg 12/11 respiratory -no organism in tracheal aspirate 12/24 BC >> RIC showing both staph epidermis 1/4 12/24 abscess >> E. Faecalis, B fragilis (B lactamase) 12/27 trach aspirate>> normal flora 1/8  Trach Achromobacter xylosoxidans and E.coli 1/8 BCX> Neg  Antimicrobials:  Fluc 12/8>> 12/10 Zosyn 12/8> 12/12 cefotetain 12/15>> off Zosyn 12/23 >> 12/30 eraxis 12/24 >> 1/2 Vancomycin 12/26 >>12/31 Unasyn 12/31 >1/9 zosyn 1/9>> vanc 1/9 >>off  Interim History / Subjective:  Good UOP with diuresis yesterday. Tolerated trach collar and returned to vent overnight  More interactive compared to yesterday, tracking and attempting to follow commands  Objective   Blood pressure (!) 121/54, pulse 87, temperature 98.3 F (36.8 C), temperature source Oral, resp. rate 19, height 5' 11"  (1.803 m), weight 64.3 kg, SpO2 100 %.    Vent Mode: PRVC FiO2 (%):  [30 %-35 %] 30 % Set Rate:  [14 bmp] 14 bmp Vt Set:  [600 mL] 600 mL PEEP:  [5 cmH20] 5 cmH20 Plateau Pressure:  [23 cmH20-27 cmH20] 23 cmH20   Intake/Output Summary (Last 24 hours) at 12/27/2020 8315 Last data filed at 12/27/2020 0700 Gross per 24 hour  Intake 3202.66 ml  Output 5050 ml  Net -1847.34 ml   Filed Weights   12/23/20 0301 12/26/20 0500 12/27/20 0445  Weight: 64.1 kg 64 kg 64.3 kg   Physical Exam: General: Cachectic chronically ill-appearing, no acute distress HENT: [place], AT, OP clear, MMM Neck: Trach in place Eyes: EOMI, no scleral icterus Respiratory: Diminished breath sounds bilaterally.  No crackles, wheezing or rales Cardiovascular: RRR, -M/R/G, no JVD GI: BS+, soft, nontender, ostomy in place Extremities: Anasarca,-tenderness,  upper extremity contractures Neuro: Opens eyes to voice, tracking, attempts to squeeze hands, no movement in lower extremities  Resolved Hospital Problem list   HCAP  Ecoli and achromobacter xylosoxidans s/p >5d Zosyn Assessment & Plan:   Ischemic bowel perf s/p ex-lap x 2 for resection, mucus fistula ileostomy, G tube placement c/b Intra-abdominal abscess s/p drain removal 1/13 Plan Will continue IV Zosyn, with plan to extend this 5 days post drain removal. 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 Poor candidate for long term TNA Plan Continue TPN, however he is not a good candidate per CCS Continuing Imodium, morphine, octreotide Palliative care consulted  Trach/Vent dependence - tolerating trach collar Bilateral pleural effusions due to malnutrition  Trach bleeding due to suction trauma Severe protein calorie malnutrition, deconditioning and overall failure to thrive resulting in very poor stamina and cough mechanics Continues to be vent dependent. Expect slow wean Plan Continue ventilator as needed, continue to attempt aerosol trach collar daily Diurese VAP bundle  AKI - improving Plan Monitor UOP/Cr Repeat diureses  Acute encephalopathy, multifactorial Prior L MCA CVA -New R sided facial droop/ flaccid RUE tone 1/6, CT head negative Plan Holding Plavix and aspirin due to history of bleeding  Severe deconditioning Contractures RN for bedside PROM/AROM PT consulted  Hypertension, uncontrolled Tachybradycardia syndrome Plan Continue telemetry monitoring Avoiding beta-blockade  Atrial fibrillation with RVR>  resolved -Not on anticoagulation because of recent surgery Plan Telemetry  Seizures well controlled Plan Keppra  Bladder Cancer Also has history of penis prosthetic Given his severe anasarca urology did  evaluate the patient on 1/12 to ensure prosthetic did not appear infected they felt no evidence of infection Plan Continue routine urostomy care Keep penis and testicles elevated Try to maximize nutrition  Anemia - stable S/p 2 units PRBC 1/8. No evidence of bleeding.  Plan Daily CBC  Goals of Care Palliative Care Consulted. Family not ready for comfort care. Wishes to have patient in LTACH/home.  Daily Goals Checklist  Pain/Anxiety/Delirium protocol (if indicated): enteral morphine only.  VAP protocol (if indicated): bundle in place Respiratory support goals: trach collar trials today.  Blood pressure target: SBP<160 DVT prophylaxis:  subcutaneous heparin Nutrition Status: severe malnutrition, continue TPN and try elemental feeds GI prophylaxis: pantoprazole Fluid status goals: euvolemia 1/11 Urinary catheter: Guide hemodynamic management Central lines: PICC Glucose control: sliding scale Mobility/therapy needs: bedrest Antibiotic de-escalation: Zosyn. Home medication reconciliation: on hold  Daily labs: CBC, BMP Code Status: No CPR but continue aggressive care otherwise  Family Communication: Palliative discussed [place] on 1/14 Disposition: ICU  Goals of Care:  Last date of multidisciplinary goals of care discussion: 1/4. On-going w/ family.  Family and staff present: Patient's daughter Ms. Alveta Heimlich Summary of discussion:  1/5- Palliative care met with family - -desired for ongoing aggressive medical care as offered, limited code blue Code Status: limited code blue no shock, no CPR  The patient is critically ill with multiple organ systems failure and requires high complexity decision making for assessment and support, frequent evaluation and titration of therapies, application of advanced monitoring technologies and extensive interpretation of multiple databases.  Independent Critical Care Time: 31 Minutes.   Rodman Pickle, M.D. [facility] Pulmonary/Critical Care  Medicine 12/27/2020 8:22 AM   Please see Amion for pager number to reach on-call Pulmonary and Critical Care Team.

## 2020-12-27 NOTE — Progress Notes (Signed)
Daily Progress Note   Patient Name: Ronald Wolf       Date: 12/27/2020 DOB: 02-26-1946  Age: 75 y.o. MRN#: [id number] Attending Physician: Margaretha Seeds, MD Primary Care Physician: Mosie Lukes, MD Admit Date: 11/27/2020  Reason for Consultation/Follow-up: To discuss complex medical decision making related to patient's goals of care  Visited patient at bedside.  He was on the vent last night but is again on trach collar today.  He is able to track me once with his eyes - it was a delayed response.  Does not appear to be in any distress today.    Family asked about bringing him home yesterday.  My information gathering indicates that if he went home it would be home with hospice and he would likely have a short prognosis.  I do not believe it is feasible to arrange for a vent or TPN in the home.  Family is not ready to consider hospice services yet.  They would ideally like to have him discharge to [facility].  Have attempted to inquire about any barriers to LTAC.    Will hold off on contacting the family today as they have had a conversation with Dr. Loanne Drilling this morning.  She is waiting on a response from them and I would like for them to directly respond to her - another provider calling may create an opportunity for confusion/delay.   Assessment: Patient appears comfortable.  Still with high ileostomy output unable to absorb nutrients.  TPN is not a long term option.   Patient Profile/HPI:   75 y.o.malewith past medical history of bladder cancer s/p urostomy, seizure, disorder, right glottic cancer s/p excision, atrial fibrillation, tobacco abuse, CVA, hepatitis C, anxiety and depressionwho was admitted on 12/1/2021with small bowel obstruction. He had left AMA [facility].  Initially he was treated with conservative measures but on 12/7 he underwent exploratory laproscopy and part of his small bowel was resected.He was taken back to the OR on 12/12 for further bowel resection. On 12/15 he had abdominal closure and ileostomy placement. During this time he has suffered from respiratory failure and been intubated 3 times. Currently he is not weaning from the vent and vent settings are too high to allow for tracheostomy placement. Patient is quite cachectic and remains on TPN.   Length  of Stay: 45   Vital Signs: BP (!) 172/77   Pulse 92   Temp 98.6 F (37 C) (Axillary)   Resp (!) 22   Ht 5\' 11"  (1.803 m)   Wt 64.3 kg   SpO2 100%   BMI 19.77 kg/m  SpO2: SpO2: 100 % O2 Device: O2 Device: Tracheostomy Collar O2 Flow Rate: O2 Flow Rate (L/min): 8 L/min       Palliative Assessment/Data: 10%     Palliative Care Plan    Recommendations/Plan:  Family has remained consistent in requesting full scope of support other than partial code.  If it has not already been done I will ask the [facility] team to investigate LTAC options.  Code Status:  Full code  Prognosis:   Unable to determine   Discharge Planning:  To Be Determined  Care plan was discussed with Dr. Loanne Drilling  Thank you for allowing the Palliative Medicine Team to assist in the care of this patient.  Total time spent:  25 min.     Greater than 50%  of this time was spent counseling and coordinating care related to the above assessment and plan.  Florentina Jenny, PA-C Palliative Medicine  Please contact Palliative MedicineTeam phone at [phone] for questions and concerns between 7 am - 7 pm.   Please see AMION for individual provider pager numbers.

## 2020-12-27 NOTE — Progress Notes (Signed)
PHARMACY - TOTAL PARENTERAL NUTRITION CONSULT NOTE  Indication: Small bowel obstruction, prolonged ileus  Patient Measurements: Height: 5\' 11"  (180.3 cm) Weight: 61.2 kg (134 lb 14.7 oz) IBW/kg (Calculated) : 75.3 TPN AdjBW (KG): 60.8 Body mass index is 18.82 kg/m. Usual Weight: 137 lbs  Assessment: 74 YOM presented 11/13/2020 with nausea, vomiting, abdominal pain after leaving AMA at [facility] with known SBO. Patient has been on bowel rest with NG tube decompression. Patient continued to have persistent SBO on imaging with no evidence of improvement, now s/p ex-lap with LoA, SBR and placement of Gtube on 12/11/2020. Pharmacy consulted for TPN management - patient received TPN 11/19/20 through 11/29/20.  Reconsulted to resume TPN on 12/2. TF currently at 30 mL/hr given high ostomy output and concern for tube feeds not absorbing. Patient on loperamide, lomotil, tincture of opium, morphine sulfate, octreotide and fiber without improvement in output.   Glucose / Insulin: no hx DM - CBGs mostly <180.  Utilized 18 units SSI in 24 hrs Electrolytes: Na 144 (FW 200 q8), K 3.8 post 24mEq PT and 4 runs, CoCa 10.98, others WNL Renal: SCr down 2.16, BUN down to 165 - Lasix 40mg  IV x1 LFTs / TGs: LFTs / TG WNL, tbili up to 1.6 Prealbumin / albumin: prealbumin 17.9 > 12.5, albumin 1.4 Intake / Output; MIVF: UOP 1.5 ml/kg/hr, ileostomy 2751mL (on fiber, opium tincture, lomotil, loperamide, morphine sulfate, octreotide and unlikely to improve per Surgery), net +11L GI Imaging:  12/11 DG Abd - stomach gas-filled, without distention, no obvious free air 12/23 CT abd pelvis - large loculated collection in low abd/pelvis, few foci of internal gas present, new RUQ ileostomy, thickened distal SB/colon, subtotal atelectatic collapse of LLL, partial collapse of RLL, small R pleural effusion, body wall edema Surgeries / Procedures:  12/12: re-exploration and washout with new copious drainage from incision concerning for  enteric leak vs. enterotomy >> now s/p, bowel necrosis resected 12/15: ex-lap with ileostomy with fistula creation, abdominal closure 12/24 pelvic abscess drain (removed 1/13)  Central access: PICC placed 11/29/2020 TPN start date: 11/19/20>>11/29/20; restart 12/03/20  Nutritional Goals (updated per RD rec 12/30): 2300-2700 kCal, 120-135g protein, >2L fluid per day  Current Nutrition:  TPN Vital AF at 20 ml/hr (goal rate 65 ml/hr) - not advancing rate d/t ileostomy output ProSource TF 26ml TID (each 11g AA and 40 kCal)  Plan:  TPN at goal rate while on TF due to concern of TF absorption  Continue concentrated TPN at goal rate of 75 ml/hr, providing 120g AA, 432g CHO, 36g ILE for a total of 2311 kCal - meeting 100% of estimated needs. 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 F/u long-term plans, [place] discussions  Kinslea Frances D. Mina Marble, PharmD, BCPS, [place] 12/27/2020, 8:42 AM

## 2020-12-27 NOTE — Progress Notes (Signed)
PCCM Interval Note  Updated patient's fiancee at bedside and daughter, [name], via phone. Discussed his inability for absorb nutrition. Although his respiratory status and mental status are improving, his inability to maintain appropriate nutrition intake limits our measures to provide meaningful care. We discussed options for care including home with hospice. Daughter queried about need for ventilator however I reminded her that if he is not able to have adequate nutrition, a ventilator would only prolong his suffering. She expressed understanding and will update remaining family.  For now, I will continue to diurese with the hope that we can wean him off the ventilator however this should not delay plans for discharge to either [facility] or hospice, pending family decision.  Rodman Pickle, M.D. [facility] Pulmonary/Critical Care Medicine 12/27/2020 9:52 AM

## 2020-12-28 DIAGNOSIS — J9601 Acute respiratory failure with hypoxia: Secondary | ICD-10-CM | POA: Diagnosis not present

## 2020-12-28 DIAGNOSIS — Z7189 Other specified counseling: Secondary | ICD-10-CM | POA: Diagnosis not present

## 2020-12-28 DIAGNOSIS — I10 Essential (primary) hypertension: Secondary | ICD-10-CM | POA: Diagnosis not present

## 2020-12-28 LAB — BASIC METABOLIC PANEL
Anion gap: 9 (ref 5–15)
Anion gap: 13 (ref 5–15)
BUN: 100 mg/dL — ABNORMAL HIGH (ref 8–23)
BUN: 143 mg/dL — ABNORMAL HIGH (ref 8–23)
CO2: 20 mmol/L — ABNORMAL LOW (ref 22–32)
CO2: 28 mmol/L (ref 22–32)
Calcium: 5.7 mg/dL — CL (ref 8.9–10.3)
Calcium: 9 mg/dL (ref 8.9–10.3)
Chloride: 118 mmol/L — ABNORMAL HIGH (ref 98–111)
Chloride: 108 mmol/L (ref 98–111)
Creatinine, Ser: 1.23 mg/dL (ref 0.61–1.24)
Creatinine, Ser: 1.78 mg/dL — ABNORMAL HIGH (ref 0.61–1.24)
GFR, Estimated: >60 mL/min (ref >60)
GFR, Estimated: 40 mL/min — ABNORMAL LOW (ref >60)
Glucose, Bld: 571 mg/dL (ref 70–99)
Glucose, Bld: 227 mg/dL — ABNORMAL HIGH (ref 70–99)
Potassium: 3.4 mmol/L — ABNORMAL LOW (ref 3.5–5.1)
Potassium: 4.6 mmol/L (ref 3.5–5.1)
Sodium: 147 mmol/L — ABNORMAL HIGH (ref 135–145)
Sodium: 149 mmol/L — ABNORMAL HIGH (ref 135–145)

## 2020-12-28 LAB — CBC
HCT: 27.2 % — ABNORMAL LOW (ref 39.0–52.0)
Hemoglobin: 8.3 g/dL — ABNORMAL LOW (ref 13.0–17.0)
MCH: 30.3 pg (ref 26.0–34.0)
MCHC: 30.5 g/dL (ref 30.0–36.0)
MCV: 99.3 fL (ref 80.0–100.0)
Platelets: 274 10*3/uL (ref 150–400)
RBC: 2.74 MIL/uL — ABNORMAL LOW (ref 4.22–5.81)
RDW: 20.2 % — ABNORMAL HIGH (ref 11.5–15.5)
WBC: 17.6 10*3/uL — ABNORMAL HIGH (ref 4.0–10.5)
nRBC: 0.1 % (ref 0.0–0.2)

## 2020-12-28 LAB — GLUCOSE, CAPILLARY
Glucose-Capillary: 158 mg/dL — ABNORMAL HIGH (ref 70–99)
Glucose-Capillary: 160 mg/dL — ABNORMAL HIGH (ref 70–99)
Glucose-Capillary: 160 mg/dL — ABNORMAL HIGH (ref 70–99)
Glucose-Capillary: 163 mg/dL — ABNORMAL HIGH (ref 70–99)
Glucose-Capillary: 169 mg/dL — ABNORMAL HIGH (ref 70–99)
Glucose-Capillary: 187 mg/dL — ABNORMAL HIGH (ref 70–99)
Glucose-Capillary: 216 mg/dL — ABNORMAL HIGH (ref 70–99)

## 2020-12-28 LAB — MAGNESIUM
Magnesium: 1.2 mg/dL — ABNORMAL LOW (ref 1.7–2.4)
Magnesium: 2.9 mg/dL — ABNORMAL HIGH (ref 1.7–2.4)
Magnesium: 2.6 mg/dL — ABNORMAL HIGH (ref 1.7–2.4)

## 2020-12-28 MED ORDER — MAGNESIUM SULFATE 4 GM/100ML IV SOLN
4.0000 g | Freq: Once | INTRAVENOUS | Status: AC
  Administered 2020-12-28: 4 g via INTRAVENOUS
  Filled 2020-12-28: qty 100

## 2020-12-28 MED ORDER — MAGNESIUM SULFATE 2 GM/50ML IV SOLN: 2.0000 g | Freq: Once | INTRAVENOUS | Status: DC

## 2020-12-28 MED ORDER — POTASSIUM CHLORIDE 20 MEQ PO PACK
40.0000 meq | PACK | Freq: Once | ORAL | Status: AC
  Administered 2020-12-28: 40 meq
  Filled 2020-12-28: qty 2

## 2020-12-28 MED ORDER — FUROSEMIDE 10 MG/ML IJ SOLN
40.0000 mg | Freq: Once | INTRAMUSCULAR | Status: AC
  Administered 2020-12-28: 40 mg via INTRAVENOUS
  Filled 2020-12-28: qty 4

## 2020-12-28 MED ORDER — LEVETIRACETAM 100 MG/ML PO SOLN
750.0000 mg | Freq: Two times a day (BID) | ORAL | Status: DC
  Administered 2020-12-28 – 2020-12-29 (×2): 750 mg
  Filled 2020-12-28 (×2): qty 10

## 2020-12-28 MED ORDER — TRACE MINERALS CU-MN-SE-ZN 300-55-60-3000 MCG/ML IV SOLN
INTRAVENOUS | Status: AC
  Filled 2020-12-28: qty 804

## 2020-12-28 NOTE — Progress Notes (Signed)
RT NOTE:  During tracheal deep suction patient had a period of asystole < 1 minute. RN at bedside.

## 2020-12-28 NOTE — Progress Notes (Signed)
PCCM Interval Note  Updated Ronald Wolf, daughter. After discussion with family they wish to make no changes to his current care and would like to pursue LTACH.  I expressed understanding and will ensure staff is aware for discharge planning. I did reiterate that his condition though improved now will likely deteriorate in the future given his inability to intake appropriate nutrition and limit his ability to heal. Daughter will continue discussions with family.  Plan Remain DNR Family wishes to be aggressive with care including vent if needed Plan for [facility]   Rodman Pickle, M.D. [facility] Pulmonary/Critical Care Medicine 12/28/2020 3:36 PM

## 2020-12-28 NOTE — Care Plan (Signed)
RN notified Elink of pt's irregular HR and increased agitation and restlessness. Will continue to monitor pt.

## 2020-12-28 NOTE — Plan of Care (Signed)
  Problem: Clinical Measurements: Goal: Will remain free from infection Outcome: Progressing   Problem: Safety: Goal: Ability to remain free from injury will improve Outcome: Progressing   

## 2020-12-28 NOTE — Progress Notes (Signed)
RT NOTE:  Switch patient back to ventilator due to irregular heart rate and labored respirations.

## 2020-12-28 NOTE — Progress Notes (Addendum)
PHARMACY - TOTAL PARENTERAL NUTRITION CONSULT NOTE  Indication: Small bowel obstruction, prolonged ileus  Patient Measurements: Height: 5' 11"  (180.3 cm) Weight: 61.2 kg (134 lb 14.7 oz) IBW/kg (Calculated) : 75.3 TPN AdjBW (KG): 60.8 Body mass index is 18.82 kg/m. Usual Weight: 137 lbs  Assessment: Ronald Wolf presented 12/03/2020 with nausea, vomiting, abdominal pain after leaving AMA at [facility] with known SBO. Patient has been on bowel rest with NG tube decompression. Patient continued to have persistent SBO on imaging with no evidence of improvement, now s/p ex-lap with LoA, SBR and placement of Gtube on 12/09/2020. Pharmacy consulted for TPN management - patient received TPN 11/19/20 through 11/29/20.  Reconsulted to resume TPN on 12/2. TF currently at 30 mL/hr given high ostomy output and concern for tube feeds not absorbing. Patient on loperamide, lomotil, tincture of opium, morphine sulfate, octreotide and fiber without improvement in output.   Glucose / Insulin: no hx DM - CBGs have been controlled, starting to trend up 1/15.  Utilized 20 units SSI in 24 hrs Electrolytes: first set of lab was incorrect:  K 3.4 and 57mq given with Lasix, Mag 1.2 and 4gm given.  Repeat labs - Na up to 149 (FW 200 q8)--?intravascularly dry, K high normal, CoCa elevated at 11.08, Mag 2.9 (none in TPN) Renal: SCr down 1.78, BUN down to 143  LFTs / TGs: LFTs / TG WNL, tbili down to 1.3, alk phos up to 139 Prealbumin / albumin: prealbumin 17.9 > 12.5, albumin 1.4 Intake / Output; MIVF: UOP 2.3 ml/kg/hr with Lasix 438mIV, drain 3045mileostomy 1450m28mn fiber, opium tincture, lomotil, loperamide, morphine sulfate, octreotide and unlikely to improve per Surgery), net +8L (down) GI Imaging:  12/11 DG Abd - stomach gas-filled, without distention, no obvious free air 12/23 CT abd pelvis - large loculated collection in low abd/pelvis, few foci of internal gas present, new RUQ ileostomy, thickened distal SB/colon,  subtotal atelectatic collapse of LLL, partial collapse of RLL, small R pleural effusion, body wall edema Surgeries / Procedures:  12/12: re-exploration and washout with new copious drainage from incision concerning for enteric leak vs. enterotomy >> now s/p, bowel necrosis resected 12/15: ex-lap with ileostomy with fistula creation, abdominal closure 12/24 pelvic abscess drain (removed 1/13)  Central access: PICC placed 12/01/2020 TPN start date: 11/19/20>>11/29/20; restart 12/03/20  Nutritional Goals (updated per RD rec 12/30): 2300-2700 kCal, 120-135g protein, >2L fluid per day  Current Nutrition:  TPN Vital AF at 20 ml/hr (goal rate 65 ml/hr) - not advancing rate d/t ileostomy output ProSource TF 45ml51m (each 11g AA and 40 kCal)  Plan:  TPN at goal rate while on TF due to concern of TF absorption  Continue concentrated TPN at goal rate of 75 ml/hr, providing 120g AA, 432g CHO, 36g ILE for a total of 2311 kCal - meeting 100% of estimated needs. 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 F/u long-term plans, [place]  Arelly Whittenberg D. Jazlynne Milliner, PharMina MarbleBCPS, BCCCP 1/16[place], 10:58 AM

## 2020-12-28 NOTE — Care Plan (Signed)
RN notified daughter, [name] of pt irregular heart rate and general condition. RN answered daughter's questions regarding pt's condition. [name] asked RN to notify her if any other changes.

## 2020-12-28 NOTE — Evaluation (Signed)
Physical Therapy Evaluation Patient Details Name: Ronald Wolf MRN: [id number] DOB: [birth year] Today's Date: 12/28/2020   History of Present Illness  Pt is a 75 y.o. male admitted 12/10/2020 with abdominal pain and nausea; workup revealed SBO, s/p NGT placement. Also with AKI. Pt is now s/p multiple ex lap with revisions and placement of G-tube. Pt self-extubated 3 times, is now s/p placement of trach on 12/29. PMH includes stroke, HTN, bladder CA s/p urostomy, seizure, Hep C, anxiety, depression.  Clinical Impression  Patient presents with generalized weakness, increased tone, deconditioning, impaired cognition and impaired mobility s/p above. Pt lethargic but jeeps eyes are open for most of session; attempting to track therapist inconsistently. Not following verbal commands but able to initiate movement when done repetitively. Performed PROM/AAROM throughout all extremities. Noted to have flexor tone in RUE and tightness in bil adductors LLE>RLE. VSS with pt on trach collar 28% Fi02, with Sp02 in mid 90s and HR ranging from 105-115 bpm during session. Per notes, palliative care following pt; family meetings to discuss POC. Possibly home on hospice. Will follow up to continue to assess any changes.    Follow Up Recommendations SNF;Supervision/Assistance - 24 hour    Equipment Recommendations  Hospital bed (if home with hospice)    Recommendations for Other Services       Precautions / Restrictions Precautions Precautions: Fall;Other (comment) Precaution Comments: trach, PEG, urostomy, colostomy, gastrostomy, bil prevalon boots Restrictions Weight Bearing Restrictions: No      Mobility  Bed Mobility               General bed mobility comments: total dependence for all positioning. Performed bed evaluation    Transfers                    Ambulation/Gait                Stairs            Wheelchair Mobility    Modified Rankin (Stroke Patients Only)        Balance                                             Pertinent Vitals/Pain Pain Assessment: Faces Pain Location: grimacing with PROM Pain Descriptors / Indicators: Grimacing Pain Intervention(s): Monitored during session    Home Living Family/patient expects to be discharged to:: Unsure Living Arrangements: Alone               Additional Comments: Not able to gain any information regarding PLOF/history. Not responding or following commands. per notes, palliative care involved and pt trending toward home with hospice?    Prior Function Level of Independence: Needs assistance               Hand Dominance        Extremity/Trunk Assessment   Upper Extremity Assessment Upper Extremity Assessment: RUE deficits/detail;Difficult to assess due to impaired cognition RUE Deficits / Details: Flexor tone present; with stretching/techniques able to extend slightly but lacking full elbow extension and limited shoulder AROM, grimacing in pain. Limited shoulder ER.    Lower Extremity Assessment Lower Extremity Assessment: RLE deficits/detail;LLE deficits/detail;Difficult to assess due to impaired cognition RLE Deficits / Details: No AROM noted throughout LEs; tightness present throughout esp hip adductors and knee flexors. LLE Deficits / Details: No AROM noted throughout LEs; tightness present  throughout esp hip adductors and knee flexors.       Communication   Communication: Tracheostomy  Cognition Arousal/Alertness: Lethargic Behavior During Therapy: Flat affect Overall Cognitive Status: Difficult to assess                                 General Comments: eye contact and brief visual tracking only; not following any verbal commands; able to initiate movement to command after multiple repetitions when performing PROM/AAROM      General Comments General comments (skin integrity, edema, etc.): Pt on 28% Fi02 trach collar; HR ranging  from 105-115 bpm with activity; Sp02 in mid 90s    Exercises General Exercises - Upper Extremity Shoulder Flexion: PROM;AAROM;Both;10 reps;Supine Shoulder ABduction: AAROM;PROM;Both;10 reps;Supine Shoulder ADduction: PROM;AAROM;Both;10 reps;Supine Elbow Flexion: PROM;AAROM;Left;Right;10 reps;Supine Elbow Extension: PROM;AAROM;Right;Left;10 reps;Supine Wrist Flexion: PROM;Both;10 reps;Supine Wrist Extension: PROM;10 reps;Both;Supine Digit Composite Flexion: PROM;Both;5 reps;Supine Composite Extension: PROM;Both;5 reps;Supine General Exercises - Lower Extremity Ankle Circles/Pumps: PROM;Both;10 reps;Supine Heel Slides: PROM;Both;10 reps;Supine Hip ABduction/ADduction: PROM;Both;10 reps;Supine   Assessment/Plan    PT Assessment Patient needs continued PT services  PT Problem List Decreased strength;Decreased activity tolerance;Decreased balance;Decreased mobility;Decreased cognition;Decreased knowledge of use of DME;Decreased safety awareness;Decreased range of motion;Decreased coordination;Pain       PT Treatment Interventions Therapeutic exercise;Manual techniques;Neuromuscular re-education;Cognitive remediation;Therapeutic activities;Patient/family education;Functional mobility training;Wheelchair mobility training    PT Goals (Current goals can be found in the Care Plan section)  Acute Rehab PT Goals Patient Stated Goal: not able to state PT Goal Formulation: Patient unable to participate in goal setting Time For Goal Achievement: 01/11/21 Potential to Achieve Goals: Poor    Frequency Min 2X/week   Barriers to discharge Decreased caregiver support      Co-evaluation               AM-PAC PT "6 Clicks" Mobility  Outcome Measure Help needed turning from your back to your side while in a flat bed without using bedrails?: Total Help needed moving from lying on your back to sitting on the side of a flat bed without using bedrails?: Total Help needed moving to and from a  bed to a chair (including a wheelchair)?: Total Help needed standing up from a chair using your arms (e.g., wheelchair or bedside chair)?: Total Help needed to walk in hospital room?: Total Help needed climbing 3-5 steps with a railing? : Total 6 Click Score: 6    End of Session Equipment Utilized During Treatment: Oxygen (trach collar 28% Fi02) Activity Tolerance: No increased pain Patient left: in bed;with call bell/phone within reach;with bed alarm set;with restraints reapplied Nurse Communication: Mobility status PT Visit Diagnosis: Other abnormalities of gait and mobility (R26.89);Muscle weakness (generalized) (M62.81)    Time: 1050-1108 PT Time Calculation (min) (ACUTE ONLY): 18 min   Charges:   PT Evaluation $PT Eval Moderate Complexity: 1 Mod          Marisa Severin, PT, DPT Acute Rehabilitation Services Pager [phone] Office [phone]      Marguarite Arbour A Sabra Heck 12/28/2020, 11:31 AM

## 2020-12-28 NOTE — Progress Notes (Signed)
NAME:  Ronald Wolf, MRN:  [id number], DOB:  [birth year], LOS: 27 ADMISSION DATE:  11/17/2020, CONSULTATION DATE:  12/7 REFERRING MD:  Cyndia Skeeters, CHIEF COMPLAINT:  Abdominal pain   Brief History:  Mr.Schartz is a 75 yo M w/ PMH of CVA, Seizures, Dementia, Bladder ca s/p urostomy, tobacco use, right glottic carcinoma s/p excision presenting with sbo. Found to have ischemic bowel requiring ex-lap 12/7.  Course complicated by septic shock, acute respiratory failure and AKI return to the OR 12/12 for necrotic small bowel resection of additional 70 cm inclusive of prior anastomosis 12/15 abdominal closure with ileostomy He has had a prolonged ICU stay with ventilator dependence, had a tracheostomy on 12/29  Past Medical History:  CVA, Dementia, Bladder CA s/p nephrostomy, tobacco use, right glottic carcinoma s/p excision, seizures.  Significant Hospital Events:  12/09/2020 Admission 11/28/2020 OR 12/06/2020 Admit to ICU 12/10 Brief SVT with associated hypertension. Self limited Given fentanyl, metop and hydral for HTN 12/11 Good urine output with Lasix , failed extubation, reintubated within 2 hours due to inability to clear secretions 12/12 return OR for enteric leak-- found to have bowel necrosis. Left in discontinuity  12/13 self extubated, reintubated. On pressors. Family meeting with PCCM and CCS to discuss possible next steps.  12/15-was back in OR, abdominal closure, RUQ mucus fistula and L ileostomy  12/19: No significant overnight events, high ileostomy output 12/21: scheduled for family meeting. Worse hypernatremia, continued high ileostomy output.  Attempted PSV/CPAP wean, pulled volumes of high 100s -low/mid 200s with tachypnea. Placed back of full support.  12/22 ongoing high output GI/ ileostomy, TPN started; weaned 12/5 all day; off fentanyl gtt, remains on precedex  12/24 IR drain , 1 unit PRBC transfusion for hemoglobin 6.5 12/26 added vancomycin for GPC in blood cultures and RIC suggesting  MRSA 12/29 tracheostomy 12/31 Palliative discussion > family understanding that his situation is critical and he has not made much improvement  1/6 ongoing palliative conversations. No progress from pt standpoint. 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  1/11 seen by surgery, IR consulted to remove percutaneous drain.  Added octreotide in effort to try to decrease output 1/12: More fatigued back on vent 1/14: Opens eyes to voice and grimaces to pain, ?tracking. Severely deconditioned and suspect long recovery requiring vent dependence at this point. Reconsult PT for developing contractures Consults:  General Surgery PCCM Palliative Care  Procedures:  12/06/2020 Ex-lap, G-tube placement 12/7 PICC >> ETT 12/7 >> 12/11, >> 12/15- back to OR for abdominal closure, exploratory laparotomy  12/24 CT-guided right pelvic abscess drain 12/29 trach  Significant Diagnostic Tests:  CT abd/pelvis 12/2 > High-grade distal small bowel obstruction, possibly due to adhesion. Mild ascites and diffuse mesenteric edema. No evidence of focal inflammatory process or abscess. Small bilateral pleural effusions and mild right lower lobe Atelectasis. CT abdomen/pelvis 12/23 Interval development of a large loculated collection in the low anterior abdomen and pelvis measuring approximately 6.1 x 11.3 x12.1 cm in size subjacent to the operative site. Few foci of internal gas are present. , no frank spillage of enteric contrast  CT A/P 1/9 Success drainage of fluid of pelvic fluid collection, bilateral pleural effusions  CT H 1/6 > no acute intracranial abnormality. Chronic L MCA changes from prior L MCA  infarct. Atrophic and chronic white matter ischemic changes.  Stable L basal ganglia infarcts  CT abd pelvis 1/10: pelvis abscess drained. BLL effusions w/ atx. Expected post op changes  Micro Data:  12/09/2020 COVID/Flu negative 12/7 BA: Few GNR rare GPC -- predominantly PMN  12/7 BCx> Neg 12/11 respiratory -no organism in tracheal aspirate 12/24 BC >> RIC showing both staph epidermis 1/4 12/24 abscess >> E. Faecalis, B fragilis (B lactamase) 12/27 trach aspirate>> normal flora 1/8  Trach Achromobacter xylosoxidans and E.coli 1/8 BCX> Neg  Antimicrobials:  Fluc 12/8>> 12/10 Zosyn 12/8> 12/12 cefotetain 12/15>> off Zosyn 12/23 >> 12/30 eraxis 12/24 >> 1/2 Vancomycin 12/26 >>12/31 Unasyn 12/31 >1/9 zosyn 1/9>> vanc 1/9 >>off  Interim History / Subjective:  Tolerated trach collar since 1/15. Diuresed well   Objective   Blood pressure (!) 153/74, pulse (!) 103, temperature 98.9 F (37.2 C), temperature source Oral, resp. rate (!) 39, height 5' 11"  (1.803 m), weight 72 kg, SpO2 95 %.    FiO2 (%):  [28 %-40 %] 28 %   Intake/Output Summary (Last 24 hours) at 12/28/2020 0759 Last data filed at 12/28/2020 0602 Gross per 24 hour  Intake 3035.35 ml  Output 5480 ml  Net -2444.65 ml   Filed Weights   12/26/20 0500 12/27/20 0445 12/28/20 0500  Weight: 64 kg 64.3 kg 72 kg   Physical Exam: General: Cachectic, chronically ill appearing, no acute distress HENT: [place], AT, OP clear, MMM Neck: Trach in place, c/d/i Eyes: EOMI, no scleral icterus Respiratory: Mild coarse breath sounds bilaterally.  No wheezing or rales Cardiovascular: RRR, -M/R/G, no JVD GI: BS+, soft, nontender, ostomy in place Extremities: Improved anasarca,-tenderness, muscle wasting Neuro: Opens eyes to voice, tracking, intermittently follows commands, moves upper extremities, grimaces to noxious stimuli x 4 GU: Foley in place  Resolved Hospital Problem list   HCAP  Ecoli and achromobacter xylosoxidans s/p >5d  Zosyn Assessment & Plan:   Ischemic bowel perf s/p ex-lap x 2 for resection, mucus fistula ileostomy, G tube placement c/b Intra-abdominal abscess s/p drain removal 1/13 Plan Will continue IV Zosyn, with plan to extend this 5 days post drain removal. 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 Poor candidate for long term TNA Plan Continue TPN, however he is not a good candidate per CCS Continuing Imodium, morphine, octreotide Palliative care following. Appreciate the assistance  Acute hypoxemic respiratory failure - resolving Trach/Vent dependence - remains on trach collar since 1/15 Bilateral pleural effusions due to malnutrition  Trach bleeding due to suction trauma High risk for needing continuous mechanical ventilation due to deconditioning in setting of severe protein calorie malnutrition. Tolerating trach collar Plan ATC, ventilation as needed Repeat diureses VAP bundle  AKI - resolving Plan Monitor UOP/Cr Repeat BMP  Acute encephalopathy, multifactorial Prior L MCA CVA -New R sided facial droop/ flaccid RUE tone 1/6, CT head negative Plan Holding Plavix and aspirin due to history of bleeding  Severe deconditioning Contractures RN for bedside PROM/AROM PT consulted  Hypertension, uncontrolled Tachybradycardia syndrome Plan Continue telemetry monitoring Avoiding beta-blockade Diuresis  Atrial fibrillation with RVR> resolved -Not on anticoagulation because of recent surgery Plan Telemetry  Seizures well controlled Plan Keppra  Bladder Cancer Also has history of penis prosthetic Given his severe anasarca urology did evaluate the patient on 1/12  to ensure prosthetic did not appear infected they felt no evidence of infection Plan Continue routine urostomy care Keep penis and testicles elevated Try to maximize  nutrition  Anemia - stable S/p 2 units PRBC 1/8. No evidence of bleeding.  Plan Daily CBC  Hypokalemia Replete  Goals of Care Palliative Care Consulted. Family considering home with hospice vs LTACH  Daily Goals Checklist  Pain/Anxiety/Delirium protocol (if indicated): enteral morphine only.  VAP protocol (if indicated): bundle in place Respiratory support goals: trach collar trials today.  Blood pressure target: SBP<160 DVT prophylaxis:  subcutaneous heparin Nutrition Status: severe malnutrition, continue TPN and try elemental feeds GI prophylaxis: pantoprazole Fluid status goals: euvolemia 1/11 Urinary catheter: Guide hemodynamic management Central lines: PICC Glucose control: sliding scale Mobility/therapy needs: bedrest Antibiotic de-escalation: Zosyn. Home medication reconciliation: on hold  Daily labs: CBC, BMP Code Status: No CPR but continue aggressive care otherwise  Family Communication: Palliative discussed [place] on 1/14 Disposition: ICU  Goals of Care:  Last date of multidisciplinary goals of care discussion: 1/14. On-going w/ family.  Family and staff present: Patient's daughter Ms. Alveta Heimlich Summary of discussion:  1/14- Palliative care met with family - -desired for ongoing aggressive medical care as offered, limited code blue Code Status: limited code blue no shock, no CPR   The patient is critically ill with multiple organ systems failure and requires high complexity decision making for assessment and support, frequent evaluation and titration of therapies, application of advanced monitoring technologies and extensive interpretation of multiple databases.  Independent Critical Care Time: 36 Minutes.   Rodman Pickle, M.D. [facility] Pulmonary/Critical Care Medicine 12/28/2020 7:59 AM   Please see Amion for pager number to reach on-call Pulmonary and Critical Care Team.

## 2020-12-29 ENCOUNTER — Inpatient Hospital Stay (HOSPITAL_COMMUNITY): Payer: Medicare PPO

## 2020-12-29 DIAGNOSIS — Z515 Encounter for palliative care: Secondary | ICD-10-CM | POA: Diagnosis not present

## 2020-12-29 DIAGNOSIS — K56609 Unspecified intestinal obstruction, unspecified as to partial versus complete obstruction: Secondary | ICD-10-CM | POA: Diagnosis not present

## 2020-12-29 DIAGNOSIS — E43 Unspecified severe protein-calorie malnutrition: Secondary | ICD-10-CM | POA: Diagnosis not present

## 2020-12-29 DIAGNOSIS — J9621 Acute and chronic respiratory failure with hypoxia: Secondary | ICD-10-CM | POA: Diagnosis not present

## 2020-12-29 DIAGNOSIS — J9601 Acute respiratory failure with hypoxia: Secondary | ICD-10-CM | POA: Diagnosis not present

## 2020-12-29 DIAGNOSIS — G9340 Encephalopathy, unspecified: Secondary | ICD-10-CM | POA: Diagnosis not present

## 2020-12-29 DIAGNOSIS — E870 Hyperosmolality and hypernatremia: Secondary | ICD-10-CM | POA: Diagnosis not present

## 2020-12-29 LAB — CBC
HCT: 30.8 % — ABNORMAL LOW (ref 39.0–52.0)
Hemoglobin: 9.4 g/dL — ABNORMAL LOW (ref 13.0–17.0)
MCH: 29.9 pg (ref 26.0–34.0)
MCHC: 30.5 g/dL (ref 30.0–36.0)
MCV: 98.1 fL (ref 80.0–100.0)
Platelets: 286 10*3/uL (ref 150–400)
RBC: 3.14 MIL/uL — ABNORMAL LOW (ref 4.22–5.81)
RDW: 20.2 % — ABNORMAL HIGH (ref 11.5–15.5)
WBC: 17.5 10*3/uL — ABNORMAL HIGH (ref 4.0–10.5)
nRBC: 0.1 % (ref 0.0–0.2)

## 2020-12-29 LAB — COMPREHENSIVE METABOLIC PANEL
ALT: 48 U/L — ABNORMAL HIGH (ref 0–44)
AST: 59 U/L — ABNORMAL HIGH (ref 15–41)
Albumin: 1.6 g/dL — ABNORMAL LOW (ref 3.5–5.0)
Alkaline Phosphatase: 162 U/L — ABNORMAL HIGH (ref 38–126)
Anion gap: 16 — ABNORMAL HIGH (ref 5–15)
BUN: 137 mg/dL — ABNORMAL HIGH (ref 8–23)
CO2: 26 mmol/L (ref 22–32)
Calcium: 8.9 mg/dL (ref 8.9–10.3)
Chloride: 110 mmol/L (ref 98–111)
Creatinine, Ser: 1.72 mg/dL — ABNORMAL HIGH (ref 0.61–1.24)
GFR, Estimated: 41 mL/min — ABNORMAL LOW (ref >60)
Glucose, Bld: 188 mg/dL — ABNORMAL HIGH (ref 70–99)
Potassium: 4.5 mmol/L (ref 3.5–5.1)
Sodium: 152 mmol/L — ABNORMAL HIGH (ref 135–145)
Total Bilirubin: 1.3 mg/dL — ABNORMAL HIGH (ref 0.3–1.2)
Total Protein: 7 g/dL (ref 6.5–8.1)

## 2020-12-29 LAB — GLUCOSE, CAPILLARY
Glucose-Capillary: 136 mg/dL — ABNORMAL HIGH (ref 70–99)
Glucose-Capillary: 141 mg/dL — ABNORMAL HIGH (ref 70–99)
Glucose-Capillary: 174 mg/dL — ABNORMAL HIGH (ref 70–99)
Glucose-Capillary: 176 mg/dL — ABNORMAL HIGH (ref 70–99)
Glucose-Capillary: 183 mg/dL — ABNORMAL HIGH (ref 70–99)
Glucose-Capillary: 206 mg/dL — ABNORMAL HIGH (ref 70–99)

## 2020-12-29 LAB — PREALBUMIN: Prealbumin: 18.7 mg/dL (ref 18–38)

## 2020-12-29 LAB — MAGNESIUM: Magnesium: 2.5 mg/dL — ABNORMAL HIGH (ref 1.7–2.4)

## 2020-12-29 LAB — TRIGLYCERIDES: Triglycerides: 67 mg/dL (ref <150)

## 2020-12-29 LAB — PHOSPHORUS: Phosphorus: 2.8 mg/dL (ref 2.5–4.6)

## 2020-12-29 MED ORDER — CHLORHEXIDINE GLUCONATE CLOTH 2 % EX PADS
6.0000 | MEDICATED_PAD | Freq: Every day | CUTANEOUS | Status: DC
  Administered 2020-12-29 – 2021-01-11 (×14): 6 via TOPICAL

## 2020-12-29 MED ORDER — TRACE MINERALS CU-MN-SE-ZN 300-55-60-3000 MCG/ML IV SOLN
INTRAVENOUS | Status: AC
  Filled 2020-12-29: qty 800

## 2020-12-29 MED ORDER — FREE WATER
200.0000 mL | Status: DC
  Administered 2020-12-29 – 2021-01-10 (×60): 200 mL

## 2020-12-29 MED ORDER — FUROSEMIDE 10 MG/ML IJ SOLN
40.0000 mg | Freq: Once | INTRAMUSCULAR | Status: AC
  Administered 2020-12-29: 40 mg via INTRAVENOUS
  Filled 2020-12-29: qty 4

## 2020-12-29 MED ORDER — SODIUM CHLORIDE 0.9 % IV SOLN
750.0000 mg | Freq: Two times a day (BID) | INTRAVENOUS | Status: DC
  Administered 2020-12-29 – 2020-12-31 (×5): 750 mg via INTRAVENOUS
  Filled 2020-12-29 (×7): qty 7.5

## 2020-12-29 NOTE — Progress Notes (Signed)
Daily Progress Note   Patient Name: Ronald Wolf       Date: 12/29/2020 DOB: 1946-02-15  Age: 75 y.o. MRN#: [id number] Attending Physician: Margaretha Seeds, MD Primary Care Physician: Mosie Lukes, MD Admit Date: 11/27/20  Reason for Consultation/Follow-up:To discuss complex medical decision making related to patient's goals of care  Chart notes reviewed.  Secure chat with Dr. Loanne Drilling.    Subjective: Spoke with Ronald Wolf on the phone.  Let her know he has been refused from [facility] due to an inability to progress.  Expressed that Case management would likely try the other LTAC in town.     Discussed patient's heart stopping 3 times.   This is a signal trying to prepare the family for what is likely to come.  Ronald Wolf expressed understanding.  She said the family is going to get together and talk tonight after everyone gets off work.     I asked if she wanted PMT to check in with her tomorrow and she replied - yes please check in. Explained that I will return to service Friday and will talk to her then if her family is still in the hospital.   Assessment: Patient deteriorating secondary to short gut syndrome/high ileostomy output.  Being fed with TPN but that is insufficient to sustain him.  He is on and off the ventilator/trach collar.   Limited code.  Non-verbal.  Sometimes able to track with eyes.  Family requests full scope treatment.     Patient Profile/HPI:  75 y.o.malewith past medical history of bladder cancer s/p urostomy, seizure, disorder, right glottic cancer s/p excision, atrial fibrillation, tobacco abuse, CVA, hepatitis C, anxiety and depressionwho was admitted on 12/16/21with small bowel obstruction. He had left A[facility] from [facility]. Initially he was treated with  conservative measures but on 12/7 he underwent exploratory laproscopy and part of his small bowel was resected.He was taken back to the OR on 12/12 for further bowel resection. On 12/15 he had abdominal closure and ileostomy placement. During this time he has suffered from respiratory failure and been intubated 3 times. Currently he is not weaning from the vent and vent settings are too high to allow for tracheostomy placement. Patient is quite cachectic and remains on TPN.    Length of Stay: 47   Vital Signs: BP 111/60  Pulse (!) 117   Temp 98.2 F (36.8 C) (Axillary)   Resp (!) 22   Ht 5\' 11"  (1.803 m)   Wt 70.5 kg   SpO2 99%   BMI 21.68 kg/m  SpO2: SpO2: 99 % O2 Device: O2 Device: Ventilator O2 Flow Rate: O2 Flow Rate (L/min): 5 L/min       Palliative Assessment/Data: 10%     Palliative Care Plan    Recommendations/Plan:  PMT will continue to follow with you.  Family's wishes are for LTAC  Recommend that interventions that will not improve his overall prognosis not be offered as they will only prolong his and the family's suffering.   Code Status:  Limited code  Prognosis:   < 2 weeks even with full support   Discharge Planning:  hopeful for LTAC  Care plan was discussed with Daughter, Ronald Wolf  Thank you for allowing the Palliative Medicine Team to assist in the care of this patient.  Total time spent:  35 min.     Greater than 50%  of this time was spent counseling and coordinating care related to the above assessment and plan.  Florentina Jenny, PA-C Palliative Medicine  Please contact Palliative MedicineTeam phone at [phone] for questions and concerns between 7 am - 7 pm.   Please see AMION for individual provider pager numbers.

## 2020-12-29 NOTE — Progress Notes (Signed)
PHARMACY - TOTAL PARENTERAL NUTRITION CONSULT NOTE  Indication: Small bowel obstruction, prolonged ileus  Patient Measurements: Height: _0  (180.3 cm) Weight: 61.2 kg (134 lb 14.7 oz) IBW/kg (Calculated) : 75.3 TPN AdjBW (KG): 60.8 Body mass index is 18.82 kg/m. Usual Weight: 137 lbs  Assessment: 74 YOM presented 12/01/2020 with nausea, vomiting, abdominal pain after leaving AMA at [facility] with known SBO. Patient has been on bowel rest with NG tube decompression. Patient continued to have persistent SBO on imaging with no evidence of improvement, now s/p ex-lap with LoA, SBR and placement of Gtube on 11/25/2020. Pharmacy consulted for TPN management - patient received TPN 11/19/20 through 11/29/20.  Reconsulted to resume TPN on 12/2. TF currently at 30 mL/hr given high ostomy output and concern for tube feeds not absorbing. Patient on loperamide, lomotil, tincture of opium, morphine sulfate, octreotide and fiber without improvement in output.   Glucose / Insulin: no hx DM - CBGs have been controlled, starting to trend up 1/15.  Utilized 23 units SSI in 24 hrs Electrolytes: K up to 4.5, Mag 2.5. Na up to 152 (FW 200 increased to q4)--?intravascularly dry, CoCa elevated at 10.4 Renal: SCr down 1.72, BUN down to 137 LFTs / TGs: LFTs / TG WNL, tbili down to 1.3, alk phos up to 162 Prealbumin / albumin: prealbumin up to 18.7, albumin 1.6 Intake / Output; MIVF: UOP 2.9 ml/kg/hr with Lasix 53m IV, drain output not charted, ileostomy 16555m(on fiber, opium tincture, lomotil, loperamide, morphine sulfate, octreotide and unlikely to improve per Surgery), net +10L (down) GI Imaging:  12/11 DG Abd - stomach gas-filled, without distention, no obvious free air 12/23 CT abd pelvis - large loculated collection in low abd/pelvis, few foci of internal gas present, new RUQ ileostomy, thickened distal SB/colon, subtotal atelectatic collapse of LLL, partial collapse of RLL, small R pleural effusion, body wall  edema Surgeries / Procedures:  12/12: re-exploration and washout with new copious drainage from incision concerning for enteric leak vs. enterotomy >> now s/p, bowel necrosis resected 12/15: ex-lap with ileostomy with fistula creation, abdominal closure 12/24 pelvic abscess drain (removed 1/13)  Central access: PICC placed 12/06/2020 TPN start date: 11/19/20>>11/29/20; restart 12/03/20  Nutritional Goals (updated per RD rec 12/30): 2300-2700 kCal, 120-135g protein, >2L fluid per day  Current Nutrition:  TPN Vital AF at 20 ml/hr (goal rate 65 ml/hr) - not advancing rate d/t ileostomy output ProSource TF 456mID (each 11g AA and 40 kCal)  Plan:  TPN at goal rate while on TF due to concern of TF absorption  Given hypernatremia, will increase free water in TPN by increasing goal rate to 100 ml/hr, providing 120g AA, 432g CHO, 36g ILE for a total of 2311 kCal - meeting 100% of estimated needs. Electrolytes in TPN: reduce Na to 75m73m, reduce K to 20mE57mtoday, remove Ca, no Mag, reduce Phos to 3mmol62m Cl:Ac 1:2 Add standard MVI and trace elements + folic acid to TPN Continue moderate SSI Q4H for now, may need to increase in AM MD increased FW 200ml P48mH - watch Na F/U AM labs, CBGs  Increase Keppra back to 750mg PT70m with improving SCr Per surgery, patient is likely to be TPN-dependent. F/u long-term plans, GOC discussions  BenjaminAlbertina Parr., BCPS, BCCCP Clinical Pharmacist Please refer to AMION foChi [facility]cy Hospital[facility]-specific pharmacist

## 2020-12-29 NOTE — Progress Notes (Signed)
During CPT the patient went asystole for a brief period of time.  CPT stopped, RN called to bedside, E-Link notified.  Patient's HR improved to mid 70's.  CPT order being canceled.

## 2020-12-29 NOTE — Plan of Care (Signed)
  Problem: Safety: Goal: Ability to remain free from injury will improve Outcome: Progressing   

## 2020-12-29 NOTE — Progress Notes (Signed)
33 Days Post-Op  Subjective: CC: Notes reviewed from over the weekend. Patients family and CCM has discussed with family who wishes to pursue LTACH.   Objective: Vital signs in last 24 hours: Temp:  [97.8 F (36.6 C)-99.3 F (37.4 C)] 97.8 F (36.6 C) (01/17 0800) Pulse Rate:  [56-137] 113 (01/17 1000) Resp:  [14-33] 33 (01/17 1000) BP: (103-205)/(60-142) 135/73 (01/17 1000) SpO2:  [91 %-100 %] 100 % (01/17 1000) FiO2 (%):  [28 %-100 %] 100 % (01/17 0752) Weight:  [70.5 kg] 70.5 kg (01/17 0415) Last BM Date: 12/29/20  Intake/Output from previous day: 01/16 0701 - 01/17 0700 In: 3098.5 [I.V.:1796.3; NG/GT:1080; IV Piggyback:222.2] Out: 1517 [OHYWV:3710; Stool:1650] Intake/Output this shift: Total I/O In: 200 [I.V.:150; IV Piggyback:50] Out: -   Vent Mode: PRVC FiO2 (%):  [28 %-100 %] 100 % Set Rate:  [14 bmp] 14 bmp Vt Set:  [600 mL] 600 mL PEEP:  [5 cmH20] 5 cmH20 Plateau Pressure:  [23 GYI94-85 cmH20] 24 cmH20  PE: IOE:VOJJKKXFGH ill appearing HEENT: trach in place. On vent  Card: RRR Pulm:mechanically ventilated WEX:HBZJ, ND, NT,openmidline woundwith fibrinous exudate and some tissue necrosis but fascia intact/ sutures visible,RUQ stoma (mucus fistula)viable withscant blood-tinged/ tanoutput, LLQ stoma (end ileostomy)pinkwithTF-appearingoutput, urostomy productive, g-tubesite c/dwith TF running Neuro:opens eyes briefly but does not respond orfollow commands  Skin: warm and dry  Lab Results:  Recent Labs    12/28/20 0320 12/29/20 0438  WBC 17.6* 17.5*  HGB 8.3* 9.4*  HCT 27.2* 30.8*  PLT 274 286   BMET Recent Labs    12/28/20 0955 12/29/20 0438  NA 149* 152*  K 4.6 4.5  CL 108 110  CO2 28 26  GLUCOSE 227* 188*  BUN 143* 137*  CREATININE 1.78* 1.72*  CALCIUM 9.0 8.9   PT/INR No results for input(s): LABPROT, INR in the last 72 hours. CMP     Component Value Date/Time   NA 152 (H) 12/29/2020 0438   NA 145 03/08/2016  1356   K 4.5 12/29/2020 0438   K 4.5 03/08/2016 1356   CL 110 12/29/2020 0438   CO2 26 12/29/2020 0438   CO2 28 03/08/2016 1356   GLUCOSE 188 (H) 12/29/2020 0438   GLUCOSE 98 03/08/2016 1356   BUN 137 (H) 12/29/2020 0438   BUN 14.2 03/08/2016 1356   CREATININE 1.72 (H) 12/29/2020 0438   CREATININE 0.9 03/08/2016 1356   CALCIUM 8.9 12/29/2020 0438   CALCIUM 9.8 03/08/2016 1356   PROT 7.0 12/29/2020 0438   PROT 8.2 03/08/2016 1356   ALBUMIN 1.6 (L) 12/29/2020 0438   ALBUMIN 3.6 03/08/2016 1356   AST 59 (H) 12/29/2020 0438   AST 42 (H) 03/08/2016 1356   ALT 48 (H) 12/29/2020 0438   ALT 43 03/08/2016 1356   ALKPHOS 162 (H) 12/29/2020 0438   ALKPHOS 79 03/08/2016 1356   BILITOT 1.3 (H) 12/29/2020 0438   BILITOT 0.74 03/08/2016 1356   GFRNONAA 41 (L) 12/29/2020 0438   GFRAA >60 12/31/2019 0221   Lipase     Component Value Date/Time   LIPASE 57 (H) 11/22/2020 1830       Studies/Results: DG CHEST PORT 1 VIEW  Result Date: 12/29/2020 CLINICAL DATA:  Hypoxemia.  Tracheostomy patient. EXAM: PORTABLE CHEST 1 VIEW COMPARISON:  Radiographs 12/21/2020 and 12/20/2020. CT 11/05/2014 and 12/21/2020. FINDINGS: 0841 hours. Stable mild patient rotation to the right. Tracheostomy and left arm PICC appear unchanged. The heart size and mediastinal contours are stable with aortic atherosclerosis. The overall  aeration of the lung bases has slightly worsened, corresponding with bilateral pleural effusions and associated bibasilar airspace opacities on recent abdominal CT. The right upper lobe airspace disease seen on the most recent exam appears about the same. No evidence of pneumothorax or acute osseous abnormality. IMPRESSION: 1. Slight worsening of bibasilar aeration corresponding with bilateral pleural effusions and bibasilar airspace opacities on recent abdominal CT. 2. Stable right upper lobe airspace disease. Electronically Signed   By: Richardean Sale M.D.   On: 12/29/2020 08:49     Anti-infectives: Anti-infectives (From admission, onward)   Start     Dose/Rate Route Frequency Ordered Stop   12/21/20 1130  vancomycin (VANCOREADY) IVPB 1250 mg/250 mL  Status:  Discontinued        1,250 mg 166.7 mL/hr over 90 Minutes Intravenous Every 48 hours 12/21/20 1032 12/22/20 0848   12/21/20 1115  piperacillin-tazobactam (ZOSYN) IVPB 3.375 g        3.375 g 12.5 mL/hr over 240 Minutes Intravenous Every 8 hours 12/21/20 1022 12/30/20 2359   12/12/20 1800  Ampicillin-Sulbactam (UNASYN) 3 g in sodium chloride 0.9 % 100 mL IVPB  Status:  Discontinued        3 g 200 mL/hr over 30 Minutes Intravenous Every 6 hours 12/12/20 1101 12/21/20 1022   12/08/20 1300  vancomycin (VANCOCIN) IVPB 1000 mg/200 mL premix  Status:  Discontinued        1,000 mg 200 mL/hr over 60 Minutes Intravenous Every 24 hours 12/08/20 0845 12/12/20 1059   12/08/20 0100  vancomycin (VANCOREADY) IVPB 500 mg/100 mL  Status:  Discontinued        500 mg 100 mL/hr over 60 Minutes Intravenous Every 12 hours 12/07/20 1130 12/07/20 1337   12/07/20 1215  vancomycin (VANCOREADY) IVPB 1250 mg/250 mL        1,250 mg 166.7 mL/hr over 90 Minutes Intravenous  Once 12/07/20 1124 12/07/20 1336   12/06/20 1030  anidulafungin (ERAXIS) 100 mg in sodium chloride 0.9 % 100 mL IVPB  Status:  Discontinued       "Followed by" Linked Group Details   100 mg 78 mL/hr over 100 Minutes Intravenous Every 24 hours 12/05/20 0939 12/13/20 1230   12/05/20 1030  anidulafungin (ERAXIS) 200 mg in sodium chloride 0.9 % 200 mL IVPB       "Followed by" Linked Group Details   200 mg 78 mL/hr over 200 Minutes Intravenous  Once 12/05/20 0939 12/05/20 1618   12/04/20 1500  piperacillin-tazobactam (ZOSYN) IVPB 3.375 g  Status:  Discontinued        3.375 g 12.5 mL/hr over 240 Minutes Intravenous Every 8 hours 12/04/20 0818 12/12/20 1059   12/04/20 0915  piperacillin-tazobactam (ZOSYN) IVPB 3.375 g        3.375 g 100 mL/hr over 30 Minutes Intravenous   Once 12/04/20 0818 12/04/20 1207   12/03/2020 0945  cefoTEtan (CEFOTAN) 2 g in sodium chloride 0.9 % 100 mL IVPB  Status:  Discontinued        2 g 200 mL/hr over 30 Minutes Intravenous To Surgery 11/21/2020 0936 12/12/2020 1057   11/25/2020 0900  cefoTEtan (CEFOTAN) 2 g in sodium chloride 0.9 % 100 mL IVPB  Status:  Discontinued        2 g 200 mL/hr over 30 Minutes Intravenous  Once 11/19/2020 0833 12/02/2020 0936   11/19/20 1130  piperacillin-tazobactam (ZOSYN) IVPB 3.375 g        3.375 g 12.5 mL/hr over 240 Minutes Intravenous Every 8 hours  11/19/20 1017 11/28/2020 2359   11/19/20 1115  fluconazole (DIFLUCAN) IVPB 200 mg        200 mg 100 mL/hr over 60 Minutes Intravenous Every 24 hours 11/19/20 1017 11/21/20 1104   11/19/20 0915  Ampicillin-Sulbactam (UNASYN) 3 g in sodium chloride 0.9 % 100 mL IVPB  Status:  Discontinued        3 g 200 mL/hr over 30 Minutes Intravenous Every 12 hours 11/19/20 0826 11/19/20 0949       Assessment/Plan SBO -s/p ex lap with SBR, placement of a gastrostomy tube12/7 by Dr. Georgette Dover. Takeback 12/12 by Dr. Zenia Resides for necrotic SB with resection of additional 60-70cm of SB inclusive of prior anastomosis in discontinuity with open abdomen. - S/P ex lap, ileostomy, mucous fistula, closure 12/15 by Dr. Grandville Silos Malnourishment/FEN-Continue TNA.Patient is really not absorbing any enteral nutrition, keepTFfeedsat20 given high ileostomy output. Continue fiber,imodium, lomotil, tincture of opium. Pre-alb 18.7 VDRF-s/p tracheostomy 12/29 ID-currently zosyn1/9>> (for E coli and Achromobacter xylosoxidans in Rcx, CCM also plans to continue this 5 days after IR drain removal 1/13).vancomycin 1/9>>1/10. unasyn 12/31>>1/9. Zosyn 1/19 - 1/18 per CCM Intra-abdominal abscess - seen on CT 12/23,s/p perc drain by IR 12/24. Culture with Enterococcus faecalis and Bacteroides fragilis, beta lactamase positive.CT 1/9 showed no evidence of significant residual fluid collection and no  new fluid collections. IR drain injection study negative therefore drain removed 1/13 CV/AF RVR- in NSRcurrently  VTE-[facility] Dispo - LTACH   LOS: 47 days    Jillyn Ledger , [facility] 12/29/2020, 10:30 AM Please see Amion for pager number during day hours 7:00am-4:30pm

## 2020-12-29 NOTE — Progress Notes (Addendum)
NAME:  Ronald Wolf, MRN:  [id number], DOB:  [birth year], LOS: 20 ADMISSION DATE:  12/11/2020, CONSULTATION DATE:  12/7 REFERRING MD:  Ronald Wolf, CHIEF COMPLAINT:  Abdominal pain   Brief History:  Ronald Wolf is a 75 yo M w/ PMH of CVA, Seizures, Dementia, Bladder ca s/p urostomy, tobacco use, right glottic carcinoma s/p excision presenting with sbo. Found to have ischemic bowel requiring ex-lap 12/7.  Course complicated by septic shock, acute respiratory failure and AKI return to the OR 12/12 for necrotic small bowel resection of additional 70 cm inclusive of prior anastomosis 12/15 abdominal closure with ileostomy He has had a prolonged ICU stay with ventilator dependence, had a tracheostomy on 12/29 Remains in ICU for ventilator dependence and arrhythmias  Past Medical History:  CVA, Dementia, Bladder CA s/p nephrostomy, tobacco use, right glottic carcinoma s/p excision, seizures.  Significant Hospital Events:  11/30/2020 Admission 12/08/2020 OR 11/20/2020 Admit to ICU 12/10 Brief SVT with associated hypertension. Self limited Given fentanyl, metop and hydral for HTN 12/11 Good urine output with Lasix , failed extubation, reintubated within 2 hours due to inability to clear secretions 12/12 return OR for enteric leak-- found to have bowel necrosis. Left in discontinuity  12/13 self extubated, reintubated. On pressors. Family meeting with PCCM and CCS to discuss possible next steps.  12/15-was back in OR, abdominal closure, RUQ mucus fistula and L ileostomy  12/19: No significant overnight events, high ileostomy output 12/21: scheduled for family meeting. Worse hypernatremia, continued high ileostomy output.  Attempted PSV/CPAP wean, pulled volumes of high 100s -low/mid 200s with tachypnea. Placed back of full support.  12/22 ongoing high output GI/ ileostomy, TPN started; weaned 12/5 all day; off fentanyl gtt, remains on precedex  12/24 IR drain , 1 unit PRBC transfusion for hemoglobin 6.5 12/26 added  vancomycin for GPC in blood cultures and RIC suggesting MRSA 12/29 tracheostomy 12/31 Palliative discussion > family understanding that his situation is critical and he has not made much improvement  1/6 ongoing palliative conversations. No progress from pt standpoint. 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  1/11 seen by surgery, IR consulted to remove percutaneous drain.  Added octreotide in effort to try to decrease output 1/12: More fatigued back on vent 1/14: Opens eyes to voice and grimaces to pain, ?tracking. Severely deconditioned and suspect long recovery requiring vent dependence at this point. Reconsult PT for developing contractures 1/15: Tolerated trach collar 1/16: Tolerated trach collar, improved alertness 1/17: Overnight had bradycardia. This morning 3 episodes of asystole <30-60s reported per nursing Consults:  General Surgery PCCM Palliative Care  Procedures:  12/03/2020 Ex-lap, G-tube placement 12/7 PICC >> ETT 12/7 >> 12/11, >> 12/15- back to OR for abdominal closure, exploratory laparotomy  12/24 CT-guided right pelvic abscess drain 12/29 trach  Significant Diagnostic Tests:  CT abd/pelvis 12/2 > High-grade distal small bowel obstruction, possibly due to adhesion. Mild ascites and diffuse mesenteric edema. No evidence of focal inflammatory process or abscess. Small bilateral pleural effusions and mild right lower lobe Atelectasis. CT abdomen/pelvis 12/23 Interval development of a large loculated collection in the low anterior abdomen and pelvis measuring approximately 6.1 x 11.3 x12.1 cm in size subjacent to the  operative site. Few foci of internal gas  are present. , no frank spillage of enteric contrast  CT A/P 1/9 Success drainage of fluid of pelvic fluid collection, bilateral pleural effusions  CT H 1/6 > no acute intracranial abnormality. Chronic L MCA changes from prior L MCA infarct. Atrophic and chronic white matter ischemic changes.  Stable L basal ganglia infarcts  CT abd pelvis 1/10: pelvis abscess drained. BLL effusions w/ atx. Expected post op changes  Micro Data:  11/14/2020 COVID/Flu negative 12/7 BA: Few GNR rare GPC -- predominantly PMN  12/7 BCx> Neg 12/11 respiratory -no organism in tracheal aspirate 12/24 BC >> RIC showing both staph epidermis 1/4 12/24 abscess >> E. Faecalis, B fragilis (B lactamase) 12/27 trach aspirate>> normal flora 1/8  Trach Achromobacter xylosoxidans and E.coli 1/8 BCX> Neg  Antimicrobials:  Fluc 12/8>> 12/10 Zosyn 12/8> 12/12 cefotetain 12/15>> off Zosyn 12/23 >> 12/30 eraxis 12/24 >> 1/2 Vancomycin 12/26 >>12/31 Unasyn 12/31 >1/9 zosyn 1/9>> vanc 1/9 >>off  Interim History / Subjective:  Overnight patient had irregular heart rate and agitation. Returned to ventilator due to labored breathing and HR. This morning has had 3 episodes of bradycardia/asystole <30-60s per staff, occurring during suctioning and chest PT. Patient more altered and less interactive this morning.   Objective   Blood pressure (!) 145/70, pulse (!) 111, temperature 99.3 F (37.4 C), temperature source Axillary, resp. rate 14, height 5' 11"  (1.803 m), weight 70.5 kg, SpO2 99 %.    Vent Mode: PRVC FiO2 (%):  [28 %-30 %] 30 % Set Rate:  [14 bmp] 14 bmp Vt Set:  [600 mL] 600 mL PEEP:  [5 cmH20] 5 cmH20 Plateau Pressure:  [23 cmH20-24 cmH20] 23 cmH20   Intake/Output Summary (Last 24 hours) at 12/29/2020 0818 Last data filed at 12/29/2020 0600 Gross per 24 hour  Intake 2788.47 ml  Output 6525 ml  Net -3736.53 ml   Filed Weights   12/27/20 0445 12/28/20 0500 12/29/20 0415  Weight: 64.3 kg 72 kg 70.5 kg    Physical Exam: General: Cachectic, chornicall ill-appearing, no acute distress HENT: [place], AT, OP clear, MMM Neck: Trach in place, c/d/i Eyes: EOMI, no scleral icterus Respiratory: Diminished breath sounds bilaterally.  No crackles, wheezing or rales Cardiovascular: RRR, -M/R/G, no JVD GI: BS+, soft, nontender, ostomy in place Extremities: Improved anasarca,-tenderness Neuro: Lethargic, opens eyes to voice, no longer tracking or following commands, withdraws upper extremities to noxious stimuli GU: Foley in place   Resolved Hospital Problem list   HCAP  Ecoli and achromobacter xylosoxidans s/p >5d Zosyn Assessment & Plan:   Ischemic bowel perf s/p ex-lap x 2 for resection S/p mucus fistula ileostomy, G tube placement  Complicated by intra-abdominal abscess s/p drain removal 1/13 Plan Will continue IV Zosyn, with plan to extend this 5 days post drain removal. End date 1/18  Severe protein calorie malnutrition High ileostomy output (>1200cc/dail) Enteral malabsorption -->short gut syndrome  CCS following: Patient not absorbing enteral nutrition. Poor candidate for long term TNA Plan Continue TPN, however he is not a good long term candidate per CCS Continuing imodium, morphine, octreotide Palliative care following. Appreciate the assistance  Acute on chronic hypoxemic respiratory failure - worsening respiratory distress  Trach/Vent dependence  Bilateral pleural effusions due to malnutrition  Trach bleeding due to suction trauma High risk for needing continuous mechanical ventilation due to deconditioning in setting of severe protein calorie malnutrition. Replaced on vent on 1/17 Plan CXR now Full vent support, wean with goal to trach  collar if able Repeat diuresis VAP bundle  Bradycardia, ?asystole, agitation May be vasovagal Plan Telemetry K>4 and Mg>2. Electrolytes ok this am  AKI - resolving Plan Monitor UOP/Cr  Acute encephalopathy, multifactorial, critical  illness Prior L MCA CVA -New R sided facial droop/ flaccid RUE tone 1/6, CT head negative Plan Holding Plavix and aspirin due to history of bleeding  Severe deconditioning Contractures RN for bedside PROM/AROM PT consulted  Hypertension, uncontrolled Tachybradycardia syndrome Plan Continue telemetry monitoring Avoiding beta-blockade Diuresis  Atrial fibrillation with RVR -Not on anticoagulation because of recent surgery Plan Telemetry  Seizures well controlled Plan Keppra  Bladder Cancer Also has history of penis prosthetic Given his severe anasarca urology did evaluate the patient on 1/12 to ensure prosthetic did not appear infected they felt no evidence of infection Plan Continue routine urostomy care Keep penis and testicles elevated Try to maximize nutrition  Anemia - stable S/p 2 units PRBC 1/8. No evidence of bleeding.  Plan Daily CBC  Hypokalemia Replete  Goals of Care Palliative Care Consulted. Family considering home with hospice vs LTACH Rediscussed GOC with daughter regarding cardiac arrythmias and patient requiring ventilator again. She wishes for patient to remain DNR but with aggressive management. She will contact her aunt for availability for a phone conference today  Daily Goals Checklist  Pain/Anxiety/Delirium protocol (if indicated): PRNs VAP protocol (if indicated): bundle in place Blood pressure target: SBP<160 DVT prophylaxis:  subcutaneous heparin Nutrition Status: severe malnutrition, continue TPN and try elemental feeds GI prophylaxis: pantoprazole Urinary catheter: Guide hemodynamic management Glucose control: sliding scale Mobility/therapy needs: bedrest Code Status: No CPR but continue aggressive care otherwise  Family Communication: Palliative discussed [place] on 1/14 Disposition: ICU  Goals of Care:  Last date of multidisciplinary goals of care discussion: 1/14. On-going w/ family.  Family and staff present: Patient's daughter  Ms. Alveta Heimlich Summary of discussion:  1/14- Palliative care met with family - -desired for ongoing aggressive medical care as offered, limited code blue Code Status: limited code blue no shock, no CPR  The patient is critically ill with multiple organ systems failure and requires high complexity decision making for assessment and support, frequent evaluation and titration of therapies, application of advanced monitoring technologies and extensive interpretation of multiple databases.  Independent Critical Care Time: 50 Minutes.   Rodman Pickle, M.D. [facility] Pulmonary/Critical Care Medicine 12/29/2020 8:18 AM   Please see Amion for pager number to reach on-call Pulmonary and Critical Care Team.

## 2020-12-29 NOTE — Progress Notes (Signed)
Informed Dr. Zenia Resides of significant drainage around G-tube. Trickle feeding stopped. She will come to floor and look at tube once finished in the OR.

## 2020-12-29 NOTE — TOC Progression Note (Addendum)
Transition of Care [facility]) - Progression Note    Patient Details  Name: Ronald Wolf MRN: [id number] Date of Birth: 1946/02/07  [facility] CM/SW Contact  Oren Section Cleta Alberts, RN Phone Number: 12/29/2020, 3:48 PM  Clinical Narrative: Damaris Schooner with [facility] in admissions at [facility]; she will review pt for eligibility/possible admission.  Will follow with updates as available.    Addendum: [facility] has reviewed and declined for admission, as pt unlikely to progress.        Expected Discharge Plan: Long Term Acute Care (LTAC) Barriers to Discharge: Continued Medical Work up  Expected Discharge Plan and Services Expected Discharge Plan: [place] (LTAC)                                               Social Determinants of Health (SDOH) Interventions    Readmission Risk Interventions No flowsheet data found.  Reinaldo Raddle, RN, BSN  Trauma/Neuro ICU Case Manager [phone]

## 2020-12-29 NOTE — Progress Notes (Signed)
eLink Physician-Brief Progress Note Patient Name: Ronald Wolf DOB: 03/15/1946 MRN: [id number]   Date of Service  12/29/2020  HPI/Events of Note  Request to shift Keppra and Morphine to IV as patient does not have GI access at this time  eICU Interventions  Shifted Keppra to IV for now. May hold off on giving morphine at this time as patient appears comfortable     Intervention Category Intermediate Interventions: Medication change / dose adjustment  Judd Lien 12/29/2020, 8:32 PM

## 2020-12-30 ENCOUNTER — Inpatient Hospital Stay (HOSPITAL_COMMUNITY): Payer: Medicare PPO

## 2020-12-30 DIAGNOSIS — J9601 Acute respiratory failure with hypoxia: Secondary | ICD-10-CM | POA: Diagnosis not present

## 2020-12-30 DIAGNOSIS — E870 Hyperosmolality and hypernatremia: Secondary | ICD-10-CM | POA: Diagnosis not present

## 2020-12-30 DIAGNOSIS — G9340 Encephalopathy, unspecified: Secondary | ICD-10-CM | POA: Diagnosis not present

## 2020-12-30 LAB — CBC
HCT: 29.5 % — ABNORMAL LOW (ref 39.0–52.0)
Hemoglobin: 8.7 g/dL — ABNORMAL LOW (ref 13.0–17.0)
MCH: 30.1 pg (ref 26.0–34.0)
MCHC: 29.5 g/dL — ABNORMAL LOW (ref 30.0–36.0)
MCV: 102.1 fL — ABNORMAL HIGH (ref 80.0–100.0)
Platelets: 262 10*3/uL (ref 150–400)
RBC: 2.89 MIL/uL — ABNORMAL LOW (ref 4.22–5.81)
RDW: 20.3 % — ABNORMAL HIGH (ref 11.5–15.5)
WBC: 19.6 10*3/uL — ABNORMAL HIGH (ref 4.0–10.5)
nRBC: 0 % (ref 0.0–0.2)

## 2020-12-30 LAB — GLUCOSE, CAPILLARY
Glucose-Capillary: 142 mg/dL — ABNORMAL HIGH (ref 70–99)
Glucose-Capillary: 156 mg/dL — ABNORMAL HIGH (ref 70–99)
Glucose-Capillary: 193 mg/dL — ABNORMAL HIGH (ref 70–99)
Glucose-Capillary: 202 mg/dL — ABNORMAL HIGH (ref 70–99)
Glucose-Capillary: 203 mg/dL — ABNORMAL HIGH (ref 70–99)
Glucose-Capillary: 99 mg/dL (ref 70–99)

## 2020-12-30 LAB — COMPREHENSIVE METABOLIC PANEL
ALT: 46 U/L — ABNORMAL HIGH (ref 0–44)
AST: 55 U/L — ABNORMAL HIGH (ref 15–41)
Albumin: 1.6 g/dL — ABNORMAL LOW (ref 3.5–5.0)
Alkaline Phosphatase: 135 U/L — ABNORMAL HIGH (ref 38–126)
Anion gap: 12 (ref 5–15)
BUN: 145 mg/dL — ABNORMAL HIGH (ref 8–23)
CO2: 29 mmol/L (ref 22–32)
Calcium: 8.7 mg/dL — ABNORMAL LOW (ref 8.9–10.3)
Chloride: 111 mmol/L (ref 98–111)
Creatinine, Ser: 1.85 mg/dL — ABNORMAL HIGH (ref 0.61–1.24)
GFR, Estimated: 38 mL/min — ABNORMAL LOW (ref >60)
Glucose, Bld: 204 mg/dL — ABNORMAL HIGH (ref 70–99)
Potassium: 4.1 mmol/L (ref 3.5–5.1)
Sodium: 152 mmol/L — ABNORMAL HIGH (ref 135–145)
Total Bilirubin: 1.2 mg/dL (ref 0.3–1.2)
Total Protein: 7 g/dL (ref 6.5–8.1)

## 2020-12-30 LAB — PHOSPHORUS: Phosphorus: 3.1 mg/dL (ref 2.5–4.6)

## 2020-12-30 LAB — MAGNESIUM: Magnesium: 2.3 mg/dL (ref 1.7–2.4)

## 2020-12-30 MED ORDER — TRACE MINERALS CU-MN-SE-ZN 300-55-60-3000 MCG/ML IV SOLN
INTRAVENOUS | Status: AC
  Filled 2020-12-30: qty 800

## 2020-12-30 NOTE — Progress Notes (Addendum)
NAME:  Ronald Wolf, MRN:  [id number], DOB:  1946/06/22, LOS: 27 ADMISSION DATE:  11/14/2020, CONSULTATION DATE:  12/7 REFERRING MD:  Ronald Wolf, CHIEF COMPLAINT:  Abdominal pain   Brief History:  Ronald Wolf is a 75 yo M w/ PMH of CVA, Seizures, Dementia, Bladder ca s/p urostomy, tobacco use, right glottic carcinoma s/p excision presenting with sbo. Found to have ischemic bowel requiring ex-lap 12/7.  Course complicated by septic shock, acute respiratory failure and AKI return to the OR 12/12 for necrotic small bowel resection of additional 70 cm inclusive of prior anastomosis 12/15 abdominal closure with ileostomy He has had a prolonged ICU stay with ventilator dependence, had a tracheostomy on 12/29 Remains in ICU for ventilator dependence and arrhythmias  Past Medical History:  CVA, Dementia, Bladder CA s/p nephrostomy, tobacco use, right glottic carcinoma s/p excision, seizures.  Significant Hospital Events:  12/05/2020 Admission 11/29/2020 OR 11/25/2020 Admit to ICU 12/10 Brief SVT with associated hypertension. Self limited Given fentanyl, metop and hydral for HTN 12/11 Good urine output with Lasix , failed extubation, reintubated within 2 hours due to inability to clear secretions 12/12 return OR for enteric leak-- found to have bowel necrosis. Left in discontinuity  12/13 self extubated, reintubated. On pressors. Family meeting with PCCM and CCS to discuss possible next steps.  12/15-was back in OR, abdominal closure, RUQ mucus fistula and L ileostomy  12/19: No significant overnight events, high ileostomy output 12/21: scheduled for family meeting. Worse hypernatremia, continued high ileostomy output.  Attempted PSV/CPAP wean, pulled volumes of high 100s -low/mid 200s with tachypnea. Placed back of full support.  12/22 ongoing high output GI/ ileostomy, TPN started; weaned 12/5 all day; off fentanyl gtt, remains on precedex  12/24 IR drain , 1 unit PRBC transfusion for hemoglobin 6.5 12/26 added  vancomycin for GPC in blood cultures and RIC suggesting MRSA 12/29 tracheostomy 12/31 Palliative discussion > family understanding that his situation is critical and he has not made much improvement  1/6 ongoing palliative conversations. No progress from pt standpoint. 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  1/11 seen by surgery, IR consulted to remove percutaneous drain.  Added octreotide in effort to try to decrease output 1/12: More fatigued back on vent 1/14: Opens eyes to voice and grimaces to pain, ?tracking. Severely deconditioned and suspect long recovery requiring vent dependence at this point. Reconsult PT for developing contractures 1/15: Tolerated trach collar 1/16: Tolerated trach collar, improved alertness 1/17: Overnight had bradycardia. This morning 3 episodes of asystole <30-60s reported per nursing Consults:  General Surgery PCCM Palliative Care  Procedures:  12/03/2020 Ex-lap, G-tube placement 12/7 PICC >> ETT 12/7 >> 12/11, >> 12/15- back to OR for abdominal closure, exploratory laparotomy  12/24 CT-guided right pelvic abscess drain 12/29 trach  Significant Diagnostic Tests:  CT abd/pelvis 12/2 > High-grade distal small bowel obstruction, possibly due to adhesion. Mild ascites and diffuse mesenteric edema. No evidence of focal inflammatory process or abscess. Small bilateral pleural effusions and mild right lower lobe Atelectasis. CT abdomen/pelvis 12/23 Interval development of a large loculated collection in the low anterior abdomen and pelvis measuring approximately 6.1 x 11.3 x12.1 cm in size subjacent to the  operative site. Few foci of internal gas  are present. , no frank spillage of enteric contrast  CT A/P 1/9 Success drainage of fluid of pelvic fluid collection, bilateral pleural effusions  CT H 1/6 > no acute intracranial abnormality. Chronic L MCA changes from prior L MCA infarct. Atrophic and chronic white matter ischemic changes.  Stable L basal ganglia infarcts  CT abd pelvis 1/10: pelvis abscess drained. BLL effusions w/ atx. Expected post op changes  Micro Data:  11/30/2020 COVID/Flu negative 12/7 BA: Few GNR rare GPC -- predominantly PMN  12/7 BCx> Neg 12/11 respiratory -no organism in tracheal aspirate 12/24 BC >> RIC showing both staph epidermis 1/4 12/24 abscess >> E. Faecalis, B fragilis (B lactamase) 12/27 trach aspirate>> normal flora 1/8  Trach Achromobacter xylosoxidans and E.coli 1/8 BCX> Neg  Antimicrobials:  Fluc 12/8>> 12/10 Zosyn 12/8> 12/12 cefotetain 12/15>> off Zosyn 12/23 >> 12/30 eraxis 12/24 >> 1/2 Vancomycin 12/26 >>12/31 Unasyn 12/31 >1/9 zosyn 1/9>> vanc 1/9 >>off  Interim History / Subjective:  PEG tube now leaking.  [facility] has denied patient for lack of ability to progress.   Objective   Blood pressure 139/71, pulse 81, temperature 97.8 F (36.6 C), temperature source Axillary, resp. rate 17, height 5' 11"  (1.803 m), weight 67.5 kg, SpO2 100 %.    Vent Mode: Stand-by FiO2 (%):  [28 %-80 %] 28 % Set Rate:  [14 bmp] 14 bmp Vt Set:  [600 mL] 600 mL PEEP:  [5 cmH20-8 cmH20] 5 cmH20 Pressure Support:  [10 cmH20] 10 cmH20 Plateau Pressure:  [23 cmH20-30 cmH20] 23 cmH20   Intake/Output Summary (Last 24 hours) at 12/30/2020 1129 Last data filed at 12/30/2020 0730 Gross per 24 hour  Intake 2758.53 ml  Output 3120 ml  Net -361.47 ml   Filed Weights   12/28/20 0500 12/29/20 0415 12/30/20 0344  Weight: 72 kg 70.5 kg 67.5 kg   Physical Exam: General: Cachectic, chornicall ill-appearing, no acute distress HENT: [place], AT, OP clear, MMM Neck: Trach in place, c/d/i Eyes: EOMI, no scleral  icterus Respiratory: Diminished breath sounds bilaterally.  No crackles, wheezing or rales Cardiovascular: RRR, -M/R/G, no JVD GI: BS+, soft, nontender, ostomy in place Extremities: Improved anasarca,-tenderness Neuro: Lethargic, opens eyes to voice, no longer tracking or following commands, withdraws upper extremities to noxious stimuli GU: Foley in place   Resolved Hospital Problem list   HCAP  Ecoli and achromobacter xylosoxidans s/p >5d Zosyn Assessment & Plan:   Ischemic bowel perf s/p ex-lap x 2 for resection S/p mucus fistula ileostomy, G tube placement  Complicated by intra-abdominal abscess s/p drain removal 1/13 - Completed 5 days of antibiotics post drain removal.   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 -Continue TPN, however he is not a good long term candidate per CCS -Continuing imodium, morphine, octreotide -Palliative care following. Appreciate the assistance -Will attempt to clarify further goals of care prior to attempting to repair PEG tube.   Acute on chronic hypoxemic respiratory failure - worsening respiratory distress  Trach/Vent dependence  Bilateral pleural effusions due to malnutrition  Trach bleeding due to suction trauma High risk for needing continuous mechanical ventilation due to deconditioning in setting of severe protein calorie malnutrition. Replaced on vent on 1/17 -Attempt short trach collar trials with gradual increase in duration as tolerated.   AKI - increasing creatinine with loss of enteral access Hypernatremia -Increase iv fluids, restart oral free water once access re-established.  Acute encephalopathy, multifactorial, critical illness, uremia.  Prior L MCA CVA New R sided facial droop/ flaccid RUE tone 1/6, CT head negative -Holding Plavix and aspirin due to history of bleeding  Severe  deconditioning Contractures -RN for bedside PROM/AROM -PT consulted  Hypertension, uncontrolled Tachybradycardia syndrome -Continue telemetry monitoring -Avoiding beta-blockade -Has done well with diuresis  Atrial fibrillation with RVR -Not on anticoagulation because of recent surgery Plan Telemetry  Seizures well controlled -Keppra  Bladder Cancer Also has history of penis prosthetic Given his severe anasarca urology did evaluate the patient on 1/12 to ensure prosthetic did not appear infected they felt no evidence of infection -Continue routine urostomy care -Keep penis and testicles elevated -Try to maximize nutrition  Anemia - stable -Transfuse for HB of <7.0  Hypokalemia Replete  Goals of Care Palliative Care Consulted. Family considering home with hospice vs LTACH Rediscussed GOC with daughter regarding cardiac arrythmias and patient requiring ventilator again. She wishes for patient to remain DNR but with aggressive management. She will contact her aunt for availability for a phone conference  Daily Goals Checklist  Pain/Anxiety/Delirium protocol (if indicated): PRNs VAP protocol (if indicated): bundle in place Blood pressure target: SBP<160 DVT prophylaxis:  subcutaneous heparin Nutrition Status: severe malnutrition, continue TPN and try elemental feeds GI prophylaxis: pantoprazole Urinary catheter: Guide hemodynamic management Glucose control: sliding scale Mobility/therapy needs: bedrest Code Status: No CPR but continue aggressive care otherwise  Family Communication: Palliative discussed [place] on 1/14. Updated sister today and informed her that the patient's condition both in terms of his severe acute illness and serious co-morbidities is unrecoverable in the long run and that current support is only effectively slowing the dying process but not able to bring him to recovery. Indicated that a transition to comfort care is the best option to prevent undue  suffering.  Disposition: ICU  Goals of Care:  Last date of multidisciplinary goals of care discussion: 1/14. On-going w/ family.  Family and staff present: Patient's daughter Ms. Alveta Heimlich Summary of discussion:  1/14- Palliative care met with family - -desired for ongoing aggressive medical care as offered, limited code blue Code Status: limited code blue no shock, no CPR  CRITICAL CARE Performed by: Kipp Brood   Total critical care time: 35 minutes  Critical care time was exclusive of separately billable procedures and treating other patients.  Critical care was necessary to treat or prevent imminent or life-threatening deterioration.  Critical care was time spent personally by me on the following activities: development of treatment plan with patient and/or surrogate as well as nursing, discussions with consultants, evaluation of patient's response to treatment, examination of patient, obtaining history from patient or surrogate, ordering and performing treatments and interventions, ordering and review of laboratory studies, ordering and review of radiographic studies, pulse oximetry, re-evaluation of patient's condition and participation in multidisciplinary rounds.  Kipp Brood, MD [facility] ICU Physician [place]  Pager: [phone] Mobile: [phone] After hours: [phone].

## 2020-12-30 NOTE — Progress Notes (Signed)
PHARMACY - TOTAL PARENTERAL NUTRITION CONSULT NOTE  Indication: Small bowel obstruction, prolonged ileus  Patient Measurements: Height: _0  (180.3 cm) Weight: 61.2 kg (134 lb 14.7 oz) IBW/kg (Calculated) : 75.3 TPN AdjBW (KG): 60.8 Body mass index is 18.82 kg/m. Usual Weight: 137 lbs  Assessment: 74 YOM presented 11/24/2020 with nausea, vomiting, abdominal pain after leaving AMA at [facility] with known SBO. Patient has been on bowel rest with NG tube decompression. Patient continued to have persistent SBO on imaging with no evidence of improvement, now s/p ex-lap with LoA, SBR and placement of Gtube on 12/11/2020. Pharmacy consulted for TPN management - patient received TPN 11/19/20 through 11/29/20.  Reconsulted to resume TPN on 12/2. TF currently at 30 mL/hr given high ostomy output and concern for tube feeds not absorbing. Patient on loperamide, lomotil, tincture of opium, morphine sulfate, octreotide and fiber without improvement in output.   Glucose / Insulin: no hx DM - CBGs have been controlled,  Utilized 24 units SSI in 24 hrs Electrolytes: K down to 4.1, Mag 2.3. Na stable at 152 (FW 200 increased to q4), CoCa elevated at 10.4 Renal: SCr up to 1.85, BUN up to 145 LFTs / TGs: LFTs / TG WNL, tbili down to 1.2, alk phos down to 135 Prealbumin / albumin: prealbumin up to 18.7, albumin 1.6 Intake / Output; MIVF: UOP 2.1 ml/kg/hr with Lasix 71m IV, drain output not charted, ileostomy 7573m(on fiber, opium tincture, lomotil, loperamide, morphine sulfate, octreotide and unlikely to improve per Surgery), net +10.4L  GI Imaging:  12/11 DG Abd - stomach gas-filled, without distention, no obvious free air 12/23 CT abd pelvis - large loculated collection in low abd/pelvis, few foci of internal gas present, new RUQ ileostomy, thickened distal SB/colon, subtotal atelectatic collapse of LLL, partial collapse of RLL, small R pleural effusion, body wall edema Surgeries / Procedures:  12/12:  re-exploration and washout with new copious drainage from incision concerning for enteric leak vs. enterotomy >> now s/p, bowel necrosis resected 12/15: ex-lap with ileostomy with fistula creation, abdominal closure 12/24 pelvic abscess drain (removed 1/13)  Central access: PICC placed 12/05/2020 TPN start date: 11/19/20>>11/29/20; restart 12/03/20  Nutritional Goals (updated per RD rec 12/30): 2300-2700 kCal, 120-135g protein, >2L fluid per day  Current Nutrition:  TPN Vital AF at 20 ml/hr (goal rate 65 ml/hr) - not advancing rate d/t ileostomy output ProSource TF 4554mID (each 11g AA and 40 kCal)  Plan:  TPN at goal rate while on TF due to concern of TF absorption  Continue TPN at goal rate to 100 ml/hr, providing 120g AA, 432g CHO, 36g ILE for a total of 2311 kCal - meeting 100% of estimated needs. Electrolytes in TPN: Continue Na 87m18m, Continue 20mE28mtoday, remove Ca, no Mag, Continue Phos 3mmol30m Cl:Ac 1:2 Add standard MVI and trace elements + folic acid to TPN Continue moderate SSI Q4H for now, may need to increase in AM MD increased FW 200ml P57mH - watch Na F/U AM labs, CBGs Per surgery, patient is likely to be TPN-dependent. F/u long-term plans, GOC discussions  BenjamiAlbertina ParrD., BCPS, BCCCP Clinical Pharmacist Please refer to AMION fMinnesota Valley Surgery Centerit-specific pharmacist

## 2020-12-30 NOTE — Progress Notes (Signed)
Dr Bobbye Morton assessed the PEG tube and determined it is leaking and not in correct position under the skin. She recommended a placement study and, if needed, tube exchange. Notified Dr Lynetta Mare who will speak with palliative and family first to determine need. Angelus Hoopes C  8:30 AM

## 2020-12-30 NOTE — Progress Notes (Signed)
Progress Note  34 Days Post-Op  Subjective: Noted plans for further palliative discussions. Concerns for drainage around G-tube overnight.   Objective: Vital signs in last 24 hours: Temp:  [97.8 F (36.6 C)-98.8 F (37.1 C)] 97.8 F (36.6 C) (01/18 0800) Pulse Rate:  [81-117] 81 (01/18 0800) Resp:  [13-22] 17 (01/18 0800) BP: (97-139)/(60-77) 139/71 (01/18 0800) SpO2:  [99 %-100 %] 100 % (01/18 0800) FiO2 (%):  [40 %-80 %] 40 % (01/18 0743) Weight:  [67.5 kg] 67.5 kg (01/18 0344) Last BM Date: 12/30/20  Intake/Output from previous day: 01/17 0701 - 01/18 0700 In: 3188.4 [I.V.:2200.7; NG/GT:641; IV Piggyback:346.8] Out: 2900 [Urine:2150; Stool:750] Intake/Output this shift: Total I/O In: -  Out: 220 [Urine:220]  PE: [id number] ill appearing HEENT: trach in place. On vent  Card: PVCs noted on monitor Pulm:mechanically ventilated [id number], ND, NT,openmidline woundwith fibrinous exudate and some tissue necrosis but fascia intact/ sutures visible,RUQstoma(mucus fistula)viable withscantblood-tinged/tanoutput, LLQstoma(end ileostomy)pinkwithbilious appearing output, urostomy productive, g-tubesite with some drainage around and G-tube clamped Neuro:opens eyes briefly but does not respond orfollow commands  Skin: warm and dry   Lab Results:  Recent Labs    12/29/20 0438 12/30/20 0839  WBC 17.5* 19.6*  HGB 9.4* 8.7*  HCT 30.8* 29.5*  PLT 286 262   BMET Recent Labs    12/29/20 0438 12/30/20 0839  NA 152* 152*  K 4.5 4.1  CL 110 111  CO2 26 29  GLUCOSE 188* 204*  BUN 137* 145*  CREATININE 1.72* 1.85*  CALCIUM 8.9 8.7*   PT/INR No results for input(s): LABPROT, INR in the last 72 hours. CMP     Component Value Date/Time   NA 152 (H) 12/30/2020 0839   NA 145 03/08/2016 1356   K 4.1 12/30/2020 0839   K 4.5 03/08/2016 1356   CL 111 12/30/2020 0839   CO2 29 12/30/2020 0839   CO2 28 03/08/2016 1356   GLUCOSE 204 (H) 12/30/2020  0839   GLUCOSE 98 03/08/2016 1356   BUN 145 (H) 12/30/2020 0839   BUN 14.2 03/08/2016 1356   CREATININE 1.85 (H) 12/30/2020 0839   CREATININE 0.9 03/08/2016 1356   CALCIUM 8.7 (L) 12/30/2020 0839   CALCIUM 9.8 03/08/2016 1356   PROT 7.0 12/30/2020 0839   PROT 8.2 03/08/2016 1356   ALBUMIN 1.6 (L) 12/30/2020 0839   ALBUMIN 3.6 03/08/2016 1356   AST 55 (H) 12/30/2020 0839   AST 42 (H) 03/08/2016 1356   ALT 46 (H) 12/30/2020 0839   ALT 43 03/08/2016 1356   ALKPHOS 135 (H) 12/30/2020 0839   ALKPHOS 79 03/08/2016 1356   BILITOT 1.2 12/30/2020 0839   BILITOT 0.74 03/08/2016 1356   GFRNONAA 38 (L) 12/30/2020 0839   GFRAA >60 12/31/2019 0221   Lipase     Component Value Date/Time   LIPASE 57 (H) 11/21/2020 1830       Studies/Results: DG CHEST PORT 1 VIEW  Result Date: 12/29/2020 CLINICAL DATA:  Hypoxemia.  Tracheostomy patient. EXAM: PORTABLE CHEST 1 VIEW COMPARISON:  Radiographs 12/21/2020 and 12/20/2020. CT 11/05/2014 and 12/21/2020. FINDINGS: 0841 hours. Stable mild patient rotation to the right. Tracheostomy and left arm PICC appear unchanged. The heart size and mediastinal contours are stable with aortic atherosclerosis. The overall aeration of the lung bases has slightly worsened, corresponding with bilateral pleural effusions and associated bibasilar airspace opacities on recent abdominal CT. The right upper lobe airspace disease seen on the most recent exam appears about the same. No evidence of pneumothorax or acute  osseous abnormality. IMPRESSION: 1. Slight worsening of bibasilar aeration corresponding with bilateral pleural effusions and bibasilar airspace opacities on recent abdominal CT. 2. Stable right upper lobe airspace disease. Electronically Signed   By: Richardean Sale M.D.   On: 12/29/2020 08:49    Anti-infectives: Anti-infectives (From admission, onward)   Start     Dose/Rate Route Frequency Ordered Stop   12/21/20 1130  vancomycin (VANCOREADY) IVPB 1250 mg/250  mL  Status:  Discontinued        1,250 mg 166.7 mL/hr over 90 Minutes Intravenous Every 48 hours 12/21/20 1032 12/22/20 0848   12/21/20 1115  piperacillin-tazobactam (ZOSYN) IVPB 3.375 g        3.375 g 12.5 mL/hr over 240 Minutes Intravenous Every 8 hours 12/21/20 1022 12/30/20 2359   12/12/20 1800  Ampicillin-Sulbactam (UNASYN) 3 g in sodium chloride 0.9 % 100 mL IVPB  Status:  Discontinued        3 g 200 mL/hr over 30 Minutes Intravenous Every 6 hours 12/12/20 1101 12/21/20 1022   12/08/20 1300  vancomycin (VANCOCIN) IVPB 1000 mg/200 mL premix  Status:  Discontinued        1,000 mg 200 mL/hr over 60 Minutes Intravenous Every 24 hours 12/08/20 0845 12/12/20 1059   12/08/20 0100  vancomycin (VANCOREADY) IVPB 500 mg/100 mL  Status:  Discontinued        500 mg 100 mL/hr over 60 Minutes Intravenous Every 12 hours 12/07/20 1130 12/07/20 1337   12/07/20 1215  vancomycin (VANCOREADY) IVPB 1250 mg/250 mL        1,250 mg 166.7 mL/hr over 90 Minutes Intravenous  Once 12/07/20 1124 12/07/20 1336   12/06/20 1030  anidulafungin (ERAXIS) 100 mg in sodium chloride 0.9 % 100 mL IVPB  Status:  Discontinued       "Followed by" Linked Group Details   100 mg 78 mL/hr over 100 Minutes Intravenous Every 24 hours 12/05/20 0939 12/13/20 1230   12/05/20 1030  anidulafungin (ERAXIS) 200 mg in sodium chloride 0.9 % 200 mL IVPB       "Followed by" Linked Group Details   200 mg 78 mL/hr over 200 Minutes Intravenous  Once 12/05/20 0939 12/05/20 1618   12/04/20 1500  piperacillin-tazobactam (ZOSYN) IVPB 3.375 g  Status:  Discontinued        3.375 g 12.5 mL/hr over 240 Minutes Intravenous Every 8 hours 12/04/20 0818 12/12/20 1059   12/04/20 0915  piperacillin-tazobactam (ZOSYN) IVPB 3.375 g        3.375 g 100 mL/hr over 30 Minutes Intravenous  Once 12/04/20 0818 12/04/20 1207   12/09/2020 0945  cefoTEtan (CEFOTAN) 2 g in sodium chloride 0.9 % 100 mL IVPB  Status:  Discontinued        2 g 200 mL/hr over 30 Minutes  Intravenous To Surgery 11/20/2020 0936 11/20/2020 1057   11/14/2020 0900  cefoTEtan (CEFOTAN) 2 g in sodium chloride 0.9 % 100 mL IVPB  Status:  Discontinued        2 g 200 mL/hr over 30 Minutes Intravenous  Once 11/19/2020 0833 11/15/2020 0936   11/19/20 1130  piperacillin-tazobactam (ZOSYN) IVPB 3.375 g        3.375 g 12.5 mL/hr over 240 Minutes Intravenous Every 8 hours 11/19/20 1017 11/16/2020 2359   11/19/20 1115  fluconazole (DIFLUCAN) IVPB 200 mg        200 mg 100 mL/hr over 60 Minutes Intravenous Every 24 hours 11/19/20 1017 11/21/20 1104   11/19/20 0915  Ampicillin-Sulbactam (UNASYN) 3  g in sodium chloride 0.9 % 100 mL IVPB  Status:  Discontinued        3 g 200 mL/hr over 30 Minutes Intravenous Every 12 hours 11/19/20 0826 11/19/20 0949       Assessment/Plan SBO -s/p ex lap with SBR, placement of a gastrostomy tube12/7 by Dr. Georgette Dover. Takeback 12/12 by Dr. Zenia Resides for necrotic SB with resection of additional 60-70cm of SB inclusive of prior anastomosis in discontinuity with open abdomen. - S/P ex lap, ileostomy, mucous fistula, closure 12/15 by Dr. Grandville Silos Malnourishment/FEN-Continue TNA.Patient is really not absorbing any enteral nutrition, TF held due to concern for malposition of G-tube. Will need study to evaluate position prior to using again - await palliative and family decision. Continue fiber,imodium, lomotil, tincture of opium. Pre-alb 18.7 VDRF-s/p tracheostomy 12/29 ID-currently zosyn1/9>> (for E coli and Achromobacter xylosoxidans in Rcx, CCM also plans to continue this 5 days after IR drain removal 1/13).vancomycin 1/9>>1/10. unasyn 12/31>>1/9. Zosyn 1/19 - 1/18 per CCM Intra-abdominal abscess - seen on CT 12/23,s/p perc drain by IR 12/24. Culture with Enterococcus faecalis and Bacteroides fragilis, beta lactamase positive.CT 1/9 showed no evidence of significant residual fluid collection and no new fluid collections. IRdrain injection study negative therefore drain  removed 1/13 CV/AF RVR- having PVCs, several episodes of asystole yesterday  VTE-SQH Dispo -LTACH  LOS: 48 days    Norm Parcel , [facility] 12/30/2020, 10:09 AM Please see Amion for pager number during day hours 7:00am-4:30pm

## 2020-12-30 NOTE — Progress Notes (Signed)
Nutrition Follow-up  DOCUMENTATION CODES:   Severe malnutrition in context of chronic illness  INTERVENTION:   TPN at goal providing 100% of nutrition needs  Tube feeding via G-tube: - on hold due to leaking site; MD discussing GOC prior to replacement.  Vital 1.2 at 20 ml/hr 45 ml ProSource TF TID  Continue enteral feedings to promote gut adaptation. TPN helping to meet needs during this time.   200 ml free water every 4 hours  1 packet Nutrisource fiber TID - provides 9 grams soluble fiber per day  NUTRITION DIAGNOSIS:   Severe Malnutrition related to chronic illness as evidenced by severe fat depletion,severe muscle depletion. Ongoing.   GOAL:   Patient will meet greater than or equal to 90% of their needs Meeting with nutrition support   MONITOR:   I & O's  REASON FOR ASSESSMENT:   Consult Enteral/tube feeding initiation and management  ASSESSMENT:   75 year old male with history of cognitive impairment, CVA, HTN, bladder cancer now with urostomy, right-sided glottic carcinoma s/p excision brought to ED with nausea, vomiting and abdominal pain.  Work-up in ED revealed small bowel obstruction.  He also had AKI and lactic acidosis.  Started on bowel rest with NG tube decompression and IV fluid.  General surgery following.  Subsequent x-rays with persistent SBO.  Pt's TFs now on hold due to leaking PEG site; MD to discuss goals of care prior to replacement. [facility] delinced patient placement.  Weaning attempts to [facility]    12/7 s/p ex lap, extensive LOA, SBR, g-tube placement; s/p bronch  12/10 TPN advanced to goal rate - provides: 2120 kcal and 121 grams protein  12/12 back to OR for re-exploration and washout with new copious drainage from incision; pt s/p reopening of recent laparotomy, SBR without reanastomosis, abd washout, placement of VAC 12/15 s/p ex lap, ileostomy, mucous fistula, and closure 12/18 TPN d/c'ed; TF at goal rate 12/22 TPN initiated to support  patient due to short bowel  12/23 opium added  12/24 rt pelvic abscess drain placed 12/26- TF re-started (trickle) 12/29 s/p trach  12/30 TF increased to 30 ml 1/10 TF decreased to 20 ml  Medications reviewed and include: lomotil 5 ml QID, SSI, imodium 2 mg every 4 hours, morphine, octreotide 50 mg every 6 hr, opium 4 drops every 6 hours   Labs reviewed: Na 152, BUN: 145 CBG's: 156-202  I&O's:  Pt is positive 3 L since admission  18 F G-tube --> leaking Ileostomy: 1450 __> 1650 ml -->  750 ml  Mucous fistula: 0 ml   UOP (urostomy): 2150 ml   Current TPN @ 100 ml/hr - provides: 2311 kcal and 120 grams protein  Diet Order:   Diet Order            Diet NPO time specified  Diet effective midnight                 EDUCATION NEEDS:   No education needs have been identified at this time  Skin:  Skin Assessment: Skin Integrity Issues: Skin Integrity Issues:: DTI DTI: bilateral heels Stage II: coccyx Incisions: closed abdomen  Height:   Ht Readings from Last 1 Encounters:  11/22/20 5\' 11"  (1.803 m)    Weight:   Wt Readings from Last 1 Encounters:  12/30/20 67.5 kg    Ideal Body Weight:  78.2 kg  BMI:  Body mass index is 20.75 kg/m.  Estimated Nutritional Needs:   Kcal:  2952-8413  Protein:  120-135 grams  Fluid:  > 2 L  Tomeeka Plaugher P., RD, LDN, CNSC See AMiON for contact information

## 2020-12-31 ENCOUNTER — Inpatient Hospital Stay (HOSPITAL_COMMUNITY): Payer: Medicare PPO

## 2020-12-31 HISTORY — PX: IR GASTROSTOMY TUBE REMOVAL: IMG5492

## 2020-12-31 LAB — BASIC METABOLIC PANEL
Anion gap: 9 (ref 5–15)
BUN: 109 mg/dL — ABNORMAL HIGH (ref 8–23)
CO2: 28 mmol/L (ref 22–32)
Calcium: 8.8 mg/dL — ABNORMAL LOW (ref 8.9–10.3)
Chloride: 117 mmol/L — ABNORMAL HIGH (ref 98–111)
Creatinine, Ser: 1.38 mg/dL — ABNORMAL HIGH (ref 0.61–1.24)
GFR, Estimated: 54 mL/min — ABNORMAL LOW (ref >60)
Glucose, Bld: 203 mg/dL — ABNORMAL HIGH (ref 70–99)
Potassium: 3.9 mmol/L (ref 3.5–5.1)
Sodium: 154 mmol/L — ABNORMAL HIGH (ref 135–145)

## 2020-12-31 LAB — CBC
HCT: 27.9 % — ABNORMAL LOW (ref 39.0–52.0)
Hemoglobin: 8.5 g/dL — ABNORMAL LOW (ref 13.0–17.0)
MCH: 30.5 pg (ref 26.0–34.0)
MCHC: 30.5 g/dL (ref 30.0–36.0)
MCV: 100 fL (ref 80.0–100.0)
Platelets: 259 10*3/uL (ref 150–400)
RBC: 2.79 MIL/uL — ABNORMAL LOW (ref 4.22–5.81)
RDW: 20.4 % — ABNORMAL HIGH (ref 11.5–15.5)
WBC: 16.3 10*3/uL — ABNORMAL HIGH (ref 4.0–10.5)
nRBC: 0.1 % (ref 0.0–0.2)

## 2020-12-31 LAB — GLUCOSE, CAPILLARY
Glucose-Capillary: 122 mg/dL — ABNORMAL HIGH (ref 70–99)
Glucose-Capillary: 124 mg/dL — ABNORMAL HIGH (ref 70–99)
Glucose-Capillary: 144 mg/dL — ABNORMAL HIGH (ref 70–99)
Glucose-Capillary: 160 mg/dL — ABNORMAL HIGH (ref 70–99)
Glucose-Capillary: 177 mg/dL — ABNORMAL HIGH (ref 70–99)
Glucose-Capillary: 227 mg/dL — ABNORMAL HIGH (ref 70–99)

## 2020-12-31 LAB — MAGNESIUM: Magnesium: 2.1 mg/dL (ref 1.7–2.4)

## 2020-12-31 LAB — PHOSPHORUS: Phosphorus: 2.4 mg/dL — ABNORMAL LOW (ref 2.5–4.6)

## 2020-12-31 MED ORDER — SODIUM CHLORIDE 0.9 % IV SOLN
0.0000 mg/h | INTRAVENOUS | Status: DC
  Administered 2020-12-31: 10:00:00 1 mg/h via INTRAVENOUS
  Administered 2021-01-01 – 2021-01-04 (×4): 2 mg/h via INTRAVENOUS
  Administered 2021-01-05: 13:00:00 1 mg/h via INTRAVENOUS
  Administered 2021-01-06 (×2): 3 mg/h via INTRAVENOUS
  Administered 2021-01-07: 4 mg/h via INTRAVENOUS
  Administered 2021-01-08: 12:00:00 2 mg/h via INTRAVENOUS
  Administered 2021-01-08: 01:00:00 3 mg/h via INTRAVENOUS
  Administered 2021-01-09 – 2021-01-12 (×4): 2 mg/h via INTRAVENOUS
  Administered 2021-01-13: 3 mg/h via INTRAVENOUS
  Administered 2021-01-13: 8 mg/h via INTRAVENOUS
  Administered 2021-01-13: 9 mg/h via INTRAVENOUS
  Administered 2021-01-14 (×2): 10 mg/h via INTRAVENOUS
  Administered 2021-01-14: 9 mg/h via INTRAVENOUS
  Administered 2021-01-14 – 2021-01-15 (×5): 10 mg/h via INTRAVENOUS
  Administered 2021-01-16: 7 mg/h via INTRAVENOUS
  Administered 2021-01-16 (×2): 5 mg/h via INTRAVENOUS
  Administered 2021-01-17 (×3): 7 mg/h via INTRAVENOUS
  Administered 2021-01-18 (×3): 10 mg/h via INTRAVENOUS
  Administered 2021-01-18: 7 mg/h via INTRAVENOUS
  Administered 2021-01-19: 10 mg/h via INTRAVENOUS
  Filled 2020-12-31 (×56): qty 5

## 2020-12-31 MED ORDER — POTASSIUM PHOSPHATES 15 MMOLE/5ML IV SOLN
10.0000 mmol | Freq: Once | INTRAVENOUS | Status: AC
  Administered 2020-12-31: 10 mmol via INTRAVENOUS
  Filled 2020-12-31: qty 3.33

## 2020-12-31 MED ORDER — TRACE MINERALS CU-MN-SE-ZN 300-55-60-3000 MCG/ML IV SOLN
INTRAVENOUS | Status: AC
  Filled 2020-12-31: qty 800

## 2020-12-31 MED ORDER — BACITRACIN-NEOMYCIN-POLYMYXIN OINTMENT TUBE
TOPICAL_OINTMENT | Freq: Every day | CUTANEOUS | Status: DC
  Administered 2020-12-31 – 2021-01-10 (×6): 1 via TOPICAL
  Filled 2020-12-31 (×2): qty 14

## 2020-12-31 MED ORDER — IOHEXOL 300 MG/ML  SOLN
50.0000 mL | Freq: Once | INTRAMUSCULAR | Status: AC | PRN
  Administered 2020-12-31: 10 mL

## 2020-12-31 MED ORDER — HYDROMORPHONE BOLUS VIA INFUSION
0.5000 mg | INTRAVENOUS | Status: DC | PRN
  Administered 2020-12-31 – 2021-01-08 (×5): 0.5 mg via INTRAVENOUS
  Filled 2020-12-31: qty 1

## 2020-12-31 MED ORDER — HYDRALAZINE HCL 20 MG/ML IJ SOLN
10.0000 mg | INTRAMUSCULAR | Status: DC | PRN
Start: 2020-12-31 — End: 2021-01-12
  Administered 2020-12-31: 20 mg via INTRAVENOUS
  Administered 2021-01-08: 10 mg via INTRAVENOUS
  Filled 2020-12-31 (×2): qty 1

## 2020-12-31 NOTE — Progress Notes (Signed)
Patient transported to IR & back on ventilator with no problems.

## 2020-12-31 NOTE — Progress Notes (Signed)
NAME:  KEITON COSMA, MRN:  [id number], DOB:  1946/04/08, LOS: 65 ADMISSION DATE:  12/02/2020, CONSULTATION DATE:  12/7 REFERRING MD:  Cyndia Skeeters, CHIEF COMPLAINT:  Abdominal pain   Brief History:  Mr.Ingerson is a 75 yo M w/ PMH of CVA, Seizures, Dementia, Bladder ca s/p urostomy, tobacco use, right glottic carcinoma s/p excision presenting with sbo. Found to have ischemic bowel requiring ex-lap 12/7.  Course complicated by septic shock, acute respiratory failure and AKI return to the OR 12/12 for necrotic small bowel resection of additional 70 cm inclusive of prior anastomosis 12/15 abdominal closure with ileostomy He has had a prolonged ICU stay with ventilator dependence, had a tracheostomy on 12/29 Remains in ICU for ventilator dependence and arrhythmias  Past Medical History:  CVA, Dementia, Bladder CA s/p nephrostomy, tobacco use, right glottic carcinoma s/p excision, seizures.  Significant Hospital Events:  12/05/2020 Admission 12/08/2020 OR 12/09/2020 Admit to ICU 12/10 Brief SVT with associated hypertension. Self limited Given fentanyl, metop and hydral for HTN 12/11 Good urine output with Lasix , failed extubation, reintubated within 2 hours due to inability to clear secretions 12/12 return OR for enteric leak-- found to have bowel necrosis. Left in discontinuity  12/13 self extubated, reintubated. On pressors. Family meeting with PCCM and CCS to discuss possible next steps.  12/15-was back in OR, abdominal closure, RUQ mucus fistula and L ileostomy  12/19: No significant overnight events, high ileostomy output 12/21: scheduled for family meeting. Worse hypernatremia, continued high ileostomy output.  Attempted PSV/CPAP wean, pulled volumes of high 100s -low/mid 200s with tachypnea. Placed back of full support.  12/22 ongoing high output GI/ ileostomy, TPN started; weaned 12/5 all day; off fentanyl gtt, remains on precedex  12/24 IR drain , 1 unit PRBC transfusion for hemoglobin 6.5 12/26 added  vancomycin for GPC in blood cultures and RIC suggesting MRSA 12/29 tracheostomy 12/31 Palliative discussion > family understanding that his situation is critical and he has not made much improvement  1/6 ongoing palliative conversations. No progress from pt standpoint. 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  1/11 seen by surgery, IR consulted to remove percutaneous drain.  Added octreotide in effort to try to decrease output 1/12: More fatigued back on vent 1/14: Opens eyes to voice and grimaces to pain, ?tracking. Severely deconditioned and suspect long recovery requiring vent dependence at this point. Reconsult PT for developing contractures 1/15: Tolerated trach collar 1/16: Tolerated trach collar, improved alertness 1/17: Overnight had bradycardia. This morning 3 episodes of asystole <30-60s reported per nursing Consults:  General Surgery PCCM Palliative Care  Procedures:  11/15/2020 Ex-lap, G-tube placement 12/7 PICC >> ETT 12/7 >> 12/11, >> 12/15- back to OR for abdominal closure, exploratory laparotomy  12/24 CT-guided right pelvic abscess drain 12/29 trach  Significant Diagnostic Tests:  CT abd/pelvis 12/2 > High-grade distal small bowel obstruction, possibly due to adhesion. Mild ascites and diffuse mesenteric edema. No evidence of focal inflammatory process or abscess. Small bilateral pleural effusions and mild right lower lobe Atelectasis. CT abdomen/pelvis 12/23 Interval development of a large loculated collection in the low anterior abdomen and pelvis measuring approximately 6.1 x 11.3 x12.1 cm in size subjacent to the  operative site. Few foci of internal gas  are present. , no frank spillage of enteric contrast  CT A/P 1/9 Success drainage of fluid of pelvic fluid collection, bilateral pleural effusions  CT H 1/6 > no acute intracranial abnormality. Chronic L MCA changes from prior L MCA infarct. Atrophic and chronic white matter ischemic changes.  Stable L basal ganglia infarcts  CT abd pelvis 1/10: pelvis abscess drained. BLL effusions w/ atx. Expected post op changes  Micro Data:  11/24/2020 COVID/Flu negative 12/7 BA: Few GNR rare GPC -- predominantly PMN  12/7 BCx> Neg 12/11 respiratory -no organism in tracheal aspirate 12/24 BC >> RIC showing both staph epidermis 1/4 12/24 abscess >> E. Faecalis, B fragilis (B lactamase) 12/27 trach aspirate>> normal flora 1/8  Trach Achromobacter xylosoxidans and E.coli 1/8 BCX> Neg  Antimicrobials:  Fluc 12/8>> 12/10 Zosyn 12/8> 12/12 cefotetain 12/15>> off Zosyn 12/23 >> 12/30 eraxis 12/24 >> 1/2 Vancomycin 12/26 >>12/31 Unasyn 12/31 >1/9 zosyn 1/9>> vanc 1/9 >>off  Interim History / Subjective:  PEG tube found to be kinked. For IR to repair today. Spoke to sister yesterday and explained that patient was refused by [facility] due to poor long-term prognosis. Explained to her that patient's condition is not survivable and that current support is simply prolonging the dying process.   Objective   Blood pressure (!) 204/94, pulse 83, temperature 97.8 F (36.6 C), temperature source Axillary, resp. rate (!) 33, height 5' 11"  (1.803 m), weight 67.5 kg, SpO2 99 %.    Vent Mode: PRVC FiO2 (%):  [28 %-40 %] 40 % Set Rate:  [14 bmp] 14 bmp Vt Set:  [600 mL] 600 mL PEEP:  [5 cmH20-8 cmH20] 8 cmH20 Plateau Pressure:  [27 cmH20-30 cmH20] 29 cmH20   Intake/Output Summary (Last 24 hours) at 12/31/2020 1018 Last data filed at 12/31/2020 0800 Gross per 24 hour  Intake 2458.7 ml  Output 3565 ml  Net -1106.3 ml   Filed Weights   12/28/20 0500 12/29/20 0415 12/30/20 0344  Weight: 72 kg 70.5 kg 67.5 kg    Physical Exam: General: Cachectic, chornicall ill-appearing, no acute distress HENT: [place], AT, OP clear, MMM Neck: Trach in place, c/d/i Eyes: EOMI, no scleral icterus Respiratory: Diminished breath sounds bilaterally.  No crackles, wheezing or rales Cardiovascular: tachycardic, -M/R/G, no JVD GI: BS+, soft, nontender, ostomy in place Extremities: Improved anasarca,-tenderness Neuro: Lethargic, opens eyes to voice, no longer tracking or following commands, withdraws upper extremities to noxious stimuli GU: Urostomy tube in place.    Resolved Hospital Problem list   HCAP  Ecoli and achromobacter xylosoxidans s/p >5d Zosyn Assessment & Plan:   Ischemic bowel perf s/p ex-lap x 2 for resection S/p mucus fistula ileostomy, G tube placement  Complicated by intra-abdominal abscess s/p drain removal 1/13 - Completed 5 days of antibiotics post drain removal.   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 -Continue TPN, however he is not a good long term candidate per CCS -Continuing imodium, morphine, octreotide -Palliative care following. Appreciate the assistance -IR to repair tube while awaiting further changes in goals of care.    Acute on chronic hypoxemic respiratory failure Trach/Vent dependence  Bilateral pleural effusions due to malnutrition  Trach bleeding due to suction trauma High risk for needing continuous mechanical ventilation due to deconditioning in setting of severe protein calorie malnutrition. Replaced on vent on 1/17 -Attempt short trach collar trials with gradual increase in duration as tolerated.  AKI - increasing creatinine with loss of enteral access Hypernatremia -Increase iv fluids, restart oral free water once access re-established.   Acute encephalopathy, multifactorial, critical illness, uremia.  Prior L MCA CVA New R sided facial droop/  flaccid RUE tone 1/6, CT head negative -Holding Plavix and aspirin due to history of bleeding  Severe deconditioning Contractures -RN for bedside PROM/AROM -PT consulted  Hypertension, uncontrolled Tachybradycardia syndrome -Continue telemetry monitoring -PRN metoprolol  Atrial fibrillation with RVR -Not on anticoagulation because of recent surgery  Seizures well controlled -Keppra  Bladder Cancer Also has history of penis prosthetic Given his severe anasarca urology did evaluate the patient on 1/12 to ensure prosthetic did not appear infected they felt no evidence of infection -Continue routine urostomy care -Keep penis and testicles elevated -Try to maximize nutrition  Anemia - stable -Transfuse for HB of <7.0  Hypokalemia Replete  Goals of Care Palliative Care Consulted. Family considering home with hospice vs LTACH Rediscussed GOC with daughter regarding cardiac arrythmias and patient requiring ventilator again. She wishes for patient to remain DNR but with aggressive management. She will contact her aunt for availability for a phone conference  Daily Goals Checklist  Pain/Anxiety/Delirium protocol (if indicated): PRNs VAP protocol (if indicated): bundle in place Blood pressure target: SBP<160 DVT prophylaxis:  subcutaneous heparin Nutrition Status: severe malnutrition, continue TPN and try elemental feeds GI prophylaxis: pantoprazole Urinary catheter: Guide hemodynamic management Glucose control: sliding scale Mobility/therapy needs: bedrest Code Status: No CPR but continue aggressive care otherwise  Family Communication: Palliative discussed [place] on 1/14. Updated sister today and informed her that the patient's condition both in terms of his severe acute illness and serious co-morbidities is unrecoverable in the long run and that current support is only effectively slowing the dying process but not able to bring him to recovery. Indicated that a transition to  comfort care is the best option to prevent undue suffering.  Disposition: ICU  Goals of Care:  Last date of multidisciplinary goals of care discussion: 1/14. On-going w/ family.  Family and staff present: Patient's daughter Ms. Alveta Heimlich Summary of discussion:  1/14- Palliative care met with family - -desired for ongoing aggressive medical care as offered, limited code blue Code Status: limited code blue no shock, no CPR  CRITICAL CARE Performed by: Kipp Brood   Total critical care time: 35 minutes  Critical care time was exclusive of separately billable procedures and treating other patients.  Critical care was necessary to treat or prevent imminent or life-threatening deterioration.  Critical care was time spent personally by me on the following activities: development of treatment plan with patient and/or surrogate as well as nursing, discussions with consultants, evaluation of patient's response to treatment, examination of patient, obtaining history from patient or surrogate, ordering and performing treatments and interventions, ordering and review of laboratory studies, ordering and review of radiographic studies, pulse oximetry, re-evaluation of patient's condition and participation in multidisciplinary rounds.  Kipp Brood, MD [facility] ICU Physician [place]  Pager: [phone] Mobile: [phone] After hours: [phone].

## 2020-12-31 NOTE — Progress Notes (Signed)
Contacted [facility]-Link to request IV PRN blood pressure medication.

## 2020-12-31 NOTE — Procedures (Signed)
Pre procedural Dx: Dysphagia, poorly functioning feeding tube. Post procedural Dx: Same  Successful fluoroscopic guided replacement of exisitng 18 Fr gastrostomy tube.   The feeding tube is ready for immediate use.  EBL: None  Complications: None immediate.  Jay Ory Elting, MD Pager #: [phone]  

## 2020-12-31 NOTE — Progress Notes (Signed)
PHARMACY - TOTAL PARENTERAL NUTRITION CONSULT NOTE  Indication: Small bowel obstruction, prolonged ileus  Patient Measurements: Height: 5' 11"  (180.3 cm) Weight: 61.2 kg (134 lb 14.7 oz) IBW/kg (Calculated) : 75.3 TPN AdjBW (KG): 60.8 Body mass index is 18.82 kg/m. Usual Weight: 137 lbs  Assessment: 74 YOM presented 12/01/2020 with nausea, vomiting, abdominal pain after leaving AMA at [facility] with known SBO. Patient has been on bowel rest with NG tube decompression. Patient continued to have persistent SBO on imaging with no evidence of improvement, now s/p ex-lap with LoA, SBR and placement of Gtube on 12/09/2020. Pharmacy consulted for TPN management - patient received TPN 11/19/20 through 11/29/20.  Reconsulted to resume TPN on 12/2. TF currently at 30 mL/hr given high ostomy output and concern for tube feeds not absorbing. Patient on loperamide, lomotil, tincture of opium, morphine sulfate, octreotide and fiber without improvement in output.   Glucose / Insulin: no hx DM - CBGs have been controlled,  Utilized 18 units SSI in 24 hrs Electrolytes: Na/CL up to 154/117 b/c not getting free water (FW 200 q4), K 3.9 (goal >/= 4 for ileus), Phos 2.4, CoCa slightly elevated at 10.72 (none in TPN), Mag 2.1 without any in TPN Renal: SCr down 1.38, BUN down to 109 LFTs / TGs: LFTs / TG WNL, tbili down to 1.2, alk phos down to 135 Prealbumin / albumin: prealbumin up to 18.7, albumin 1.6 Intake / Output; MIVF: UOP 2 ml/kg/hr with Lasix 49m IV, drain output not charted, ileostomy 4234m(on fiber, opium tincture, lomotil, loperamide, morphine sulfate, octreotide and unlikely to improve per Surgery), net -1.6L  GI Imaging:  12/11 DG Abd - stomach gas-filled, without distention, no obvious free air 12/23 CT abd pelvis - large loculated collection in low abd/pelvis, few foci of internal gas present, new RUQ ileostomy, thickened distal SB/colon, subtotal atelectatic collapse of LLL, partial collapse of RLL,  small R pleural effusion, body wall edema Surgeries / Procedures:  12/12: re-exploration and washout with new copious drainage from incision concerning for enteric leak vs. enterotomy >> now s/p, bowel necrosis resected 12/15: ex-lap with ileostomy with fistula creation, abdominal closure 12/24 pelvic abscess drain (removed 1/13)  Central access: PICC placed 12/08/2020 TPN start date: 11/19/20>>11/29/20; restart 12/03/20  Nutritional Goals (updated per RD rec 12/30): 2300-2700 kCal, 120-135g protein, >2L fluid per day  Current Nutrition:  TPN (increased 75 >> 100 ml/hr on 1/17 to provide more free water).   Off TF since 1/17 d/t drainage around Gtube (was not advancing TF d/t high ileostomy output)   Plan:  Continue TPN at goal rate to 100 ml/hr, providing 120g AA, 432g CHO, 36g ILE for a total of 2311 kCal, meeting 100% of estimated needs. Electrolytes in TPN: remove Na, increase K to 3523mL, Ca removed 1/16, no Mag, increase Phos to 5mm32mL, max acetate Add standard MVI and trace elements + folic acid to TPN Continue moderate SSI Q4H for now FW 200ml70mQ4H per MD KPhos 10mmo18m x 1 F/U AM labs, ability to use Gtube to restart held meds/TF  Teresa Lemmerman D. Julissa Browning, Mina MarblemD, BCPS, BCCCP [place]2022, 7:18 AM

## 2020-12-31 NOTE — Progress Notes (Signed)
eLink Physician-Brief Progress Note Patient Name: Ronald Wolf DOB: 04-13-1946 MRN: [id number]   Date of Service  12/31/2020  HPI/Events of Note  Patient unable to get scheduled home anti-hypertensive medications, and BP is currently 180/90, MAP 114.Per Dr. Michelle Piper guidelines,  he would like to keep BP < 160 mmHg.  eICU Interventions  PRN iv Hydralazine ordered targeting a SBP < 160 mmHg.        [name] 12/31/2020, 5:10 AM

## 2021-01-01 DIAGNOSIS — Z515 Encounter for palliative care: Secondary | ICD-10-CM | POA: Diagnosis not present

## 2021-01-01 DIAGNOSIS — Z8551 Personal history of malignant neoplasm of bladder: Secondary | ICD-10-CM | POA: Diagnosis not present

## 2021-01-01 DIAGNOSIS — K56609 Unspecified intestinal obstruction, unspecified as to partial versus complete obstruction: Secondary | ICD-10-CM | POA: Diagnosis not present

## 2021-01-01 DIAGNOSIS — J9601 Acute respiratory failure with hypoxia: Secondary | ICD-10-CM | POA: Diagnosis not present

## 2021-01-01 LAB — GLUCOSE, CAPILLARY
Glucose-Capillary: 118 mg/dL — ABNORMAL HIGH (ref 70–99)
Glucose-Capillary: 130 mg/dL — ABNORMAL HIGH (ref 70–99)
Glucose-Capillary: 144 mg/dL — ABNORMAL HIGH (ref 70–99)
Glucose-Capillary: 155 mg/dL — ABNORMAL HIGH (ref 70–99)
Glucose-Capillary: 156 mg/dL — ABNORMAL HIGH (ref 70–99)
Glucose-Capillary: 164 mg/dL — ABNORMAL HIGH (ref 70–99)

## 2021-01-01 LAB — PHOSPHORUS: Phosphorus: 3 mg/dL (ref 2.5–4.6)

## 2021-01-01 LAB — COMPREHENSIVE METABOLIC PANEL
ALT: 49 U/L — ABNORMAL HIGH (ref 0–44)
AST: 53 U/L — ABNORMAL HIGH (ref 15–41)
Albumin: 1.6 g/dL — ABNORMAL LOW (ref 3.5–5.0)
Alkaline Phosphatase: 130 U/L — ABNORMAL HIGH (ref 38–126)
Anion gap: 8 (ref 5–15)
BUN: 95 mg/dL — ABNORMAL HIGH (ref 8–23)
CO2: 27 mmol/L (ref 22–32)
Calcium: 8.6 mg/dL — ABNORMAL LOW (ref 8.9–10.3)
Chloride: 117 mmol/L — ABNORMAL HIGH (ref 98–111)
Creatinine, Ser: 1.24 mg/dL (ref 0.61–1.24)
GFR, Estimated: >60 mL/min (ref >60)
Glucose, Bld: 114 mg/dL — ABNORMAL HIGH (ref 70–99)
Potassium: 3.9 mmol/L (ref 3.5–5.1)
Sodium: 152 mmol/L — ABNORMAL HIGH (ref 135–145)
Total Bilirubin: 1.2 mg/dL (ref 0.3–1.2)
Total Protein: 6.8 g/dL (ref 6.5–8.1)

## 2021-01-01 LAB — MAGNESIUM: Magnesium: 1.8 mg/dL (ref 1.7–2.4)

## 2021-01-01 MED ORDER — TRACE MINERALS CU-MN-SE-ZN 300-55-60-3000 MCG/ML IV SOLN
INTRAVENOUS | Status: AC
  Filled 2021-01-01: qty 800

## 2021-01-01 MED ORDER — MAGNESIUM SULFATE IN D5W 1-5 GM/100ML-% IV SOLN
1.0000 g | Freq: Once | INTRAVENOUS | Status: AC
  Administered 2021-01-01: 1 g via INTRAVENOUS
  Filled 2021-01-01: qty 100

## 2021-01-01 MED ORDER — LEVETIRACETAM 100 MG/ML PO SOLN
750.0000 mg | Freq: Two times a day (BID) | ORAL | Status: DC
  Administered 2021-01-01 – 2021-01-12 (×23): 750 mg
  Filled 2021-01-01 (×24): qty 10

## 2021-01-01 NOTE — Progress Notes (Addendum)
PHARMACY - TOTAL PARENTERAL NUTRITION CONSULT NOTE  Indication: Small bowel obstruction, prolonged ileus  Patient Measurements: Height: 5' 11"  (180.3 cm) Weight: 61.2 kg (134 lb 14.7 oz) IBW/kg (Calculated) : 75.3 TPN AdjBW (KG): 60.8 Body mass index is 18.82 kg/m. Usual Weight: 137 lbs  Assessment: 74 YOM presented 12/04/2020 with nausea, vomiting, abdominal pain after leaving AMA at [facility] with known SBO. Patient has been on bowel rest with NG tube decompression. Patient continued to have persistent SBO on imaging with no evidence of improvement, now s/p ex-lap with LoA, SBR and placement of Gtube on 11/21/2020. Pharmacy consulted for TPN management - patient received TPN 11/19/20 through 11/29/20.  Reconsulted to resume TPN on 12/2. TF currently at 30 mL/hr given high ostomy output and concern for tube feeds not absorbing. Patient on loperamide, lomotil, tincture of opium, morphine sulfate, octreotide and fiber without improvement in output.   Glucose / Insulin: no hx DM - CBGs controlled.  Utilized 15 units SSI in 24 hrs Electrolytes: Na/CL down 152/117 (FW 200 q4), K 3.9 (goal >/= 4 for ileus), CoCa slightly elevated at 10.52 (none in TPN), Mag down to 1.8 (goal >/= 2 for ileus) Renal:  LFTs / TGs: alk phos down 130, AST/ALT mildly elevated 53/49, tbili 1.2, TG WNL Prealbumin / albumin: prealbumin up to 18.7, albumin 1.6 Intake / Output; MIVF: UOP 2 ml/kg/hr, drain output not charted, ileostomy 423m (on fiber, opium tincture, lomotil, loperamide, morphine sulfate, octreotide and unlikely to improve per Surgery), net -1.6L  GI Imaging:  12/11 DG Abd - stomach gas-filled, without distention, no obvious free air 12/23 CT abd pelvis - large loculated collection in low abd/pelvis, few foci of internal gas present, new RUQ ileostomy, thickened distal SB/colon, subtotal atelectatic collapse of LLL, partial collapse of RLL, small R pleural effusion, body wall edema Surgeries / Procedures:  12/12:  re-exploration and washout with new copious drainage from incision concerning for enteric leak vs. enterotomy >> now s/p, bowel necrosis resected 12/15: ex-lap with ileostomy with fistula creation, abdominal closure 12/24 pelvic abscess drain (removed 1/13) 1/19 PEG tube replaced  Central access: PICC placed 11/22/2020 TPN start date: 11/19/20>>11/29/20; restart 12/03/21  Nutritional Goals (updated per RD rec 12/30): 2300-2700 kCal, 120-135g protein, >2L fluid per day  Current Nutrition:  TPN (increased 75 >> 100 ml/hr on 1/17 to provide more free water) Vital AF 1.2 at 20 ml/hr (not advancing d/t high ileostomy output) Prosource TID - 2 charted given  Plan:  Adjust TPN to give 21g/L SMOFlipid MWF only due to shortage and meet 100% of patient needs  Continue TPN at goal rate to 100 ml/hr, providing a weekly average of 2328 kCal and 120g AA per day Electrolytes in TPN: Na removed 1/19, increase K to 452m/L, Ca removed 1/16, add back Mag 49m89mL, Phos increased to 5mm28mL on 1/19, max acetate Add standard MVI and trace elements + folic acid to TPN Continue moderate SSI Q4H for now - watch CBGs with increasing CHO provision in TPN FW 200ml59mQ4H per MD Mag sulfate 1gm IV x 1 Standard TPN labs on Mon/Thurs, repeat labs on Sat  Bradon Fester D. Lilyann Gravelle,Mina MarblermD, BCPS, [place]CCC[place]/2022, 8:37 AM

## 2021-01-01 NOTE — Progress Notes (Signed)
NAME:  Ronald Wolf, MRN:  [id number], DOB:  Sep 30, 1946, LOS: 67 ADMISSION DATE:  11/16/2020, CONSULTATION DATE:  12/7 REFERRING MD:  Cyndia Skeeters, CHIEF COMPLAINT:  Abdominal pain   Brief History:  Mr.Anastos is a 75 yo M w/ PMH of CVA, Seizures, Dementia, Bladder ca s/p urostomy, tobacco use, right glottic carcinoma s/p excision presenting with sbo. Found to have ischemic bowel requiring ex-lap 12/7.  Course complicated by septic shock, acute respiratory failure and AKI return to the OR 12/12 for necrotic small bowel resection of additional 70 cm inclusive of prior anastomosis 12/15 abdominal closure with ileostomy He has had a prolonged ICU stay with ventilator dependence, had a tracheostomy on 12/29 Remains in ICU for ventilator dependence and arrhythmias  Past Medical History:  CVA, Dementia, Bladder CA s/p nephrostomy, tobacco use, right glottic carcinoma s/p excision, seizures.  Significant Hospital Events:  11/22/2020 Admission 12/03/2020 OR 11/29/2020 Admit to ICU 12/10 Brief SVT with associated hypertension. Self limited Given fentanyl, metop and hydral for HTN 12/11 Good urine output with Lasix , failed extubation, reintubated within 2 hours due to inability to clear secretions 12/12 return OR for enteric leak-- found to have bowel necrosis. Left in discontinuity  12/13 self extubated, reintubated. On pressors. Family meeting with PCCM and CCS to discuss possible next steps.  12/15-was back in OR, abdominal closure, RUQ mucus fistula and L ileostomy  12/19: No significant overnight events, high ileostomy output 12/21: scheduled for family meeting. Worse hypernatremia, continued high ileostomy output.  Attempted PSV/CPAP wean, pulled volumes of high 100s -low/mid 200s with tachypnea. Placed back of full support.  12/22 ongoing high output GI/ ileostomy, TPN started; weaned 12/5 all day; off fentanyl gtt, remains on precedex  12/24 IR drain , 1 unit PRBC transfusion for hemoglobin 6.5 12/26 added  vancomycin for GPC in blood cultures and RIC suggesting MRSA 12/29 tracheostomy 12/31 Palliative discussion > family understanding that his situation is critical and he has not made much improvement  1/6 ongoing palliative conversations. No progress from pt standpoint. 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  1/11 seen by surgery, IR consulted to remove percutaneous drain.  Added octreotide in effort to try to decrease output 1/12: More fatigued back on vent 1/14: Opens eyes to voice and grimaces to pain, ?tracking. Severely deconditioned and suspect long recovery requiring vent dependence at this point. Reconsult PT for developing contractures 1/15: Tolerated trach collar 1/16: Tolerated trach collar, improved alertness 1/17: Overnight had bradycardia. This morning 3 episodes of asystole <30-60s reported per nursing Consults:  General Surgery PCCM Palliative Care  Procedures:  12/09/2020 Ex-lap, G-tube placement 12/7 PICC >> ETT 12/7 >> 12/11, >> 12/15- back to OR for abdominal closure, exploratory laparotomy  12/24 CT-guided right pelvic abscess drain 12/29 trach  Significant Diagnostic Tests:  CT abd/pelvis 12/2 > High-grade distal small bowel obstruction, possibly due to adhesion. Mild ascites and diffuse mesenteric edema. No evidence of focal inflammatory process or abscess. Small bilateral pleural effusions and mild right lower lobe Atelectasis. CT abdomen/pelvis 12/23 Interval development of a large loculated collection in the low anterior abdomen and pelvis measuring approximately 6.1 x 11.3 x12.1 cm in size subjacent to the  operative site. Few foci of internal gas  are present. , no frank spillage of enteric contrast  CT A/P 1/9 Success drainage of fluid of pelvic fluid collection, bilateral pleural effusions  CT H 1/6 > no acute intracranial abnormality. Chronic L MCA changes from prior L MCA infarct. Atrophic and chronic white matter ischemic changes.  Stable L basal ganglia infarcts  CT abd pelvis 1/10: pelvis abscess drained. BLL effusions w/ atx. Expected post op changes  Micro Data:  12/07/2020 COVID/Flu negative 12/7 BA: Few GNR rare GPC -- predominantly PMN  12/7 BCx> Neg 12/11 respiratory -no organism in tracheal aspirate 12/24 BC >> RIC showing both staph epidermis 1/4 12/24 abscess >> E. Faecalis, B fragilis (B lactamase) 12/27 trach aspirate>> normal flora 1/8  Trach Achromobacter xylosoxidans and E.coli 1/8 BCX> Neg  Antimicrobials:  Fluc 12/8>> 12/10 Zosyn 12/8> 12/12 cefotetain 12/15>> off Zosyn 12/23 >> 12/30 eraxis 12/24 >> 1/2 Vancomycin 12/26 >>12/31 Unasyn 12/31 >1/9 zosyn 1/9>> vanc 1/9 >>off  Interim History / Subjective:   Spoke to sister 1/18 and explained that patient was refused by [facility] due to poor long-term prognosis. Explained to her that patient's condition is not survivable and that current support is simply prolonging the dying process. PEG tube successfully replaced yesterday.  Started on hydromorphone infusion yesterday for visible discomfort with movement.   Objective   Blood pressure (!) 109/54, pulse 81, temperature 97.8 F (36.6 C), temperature source Axillary, resp. rate 19, height 5' 11"  (1.803 m), weight 67.5 kg, SpO2 97 %.    Vent Mode: PRVC FiO2 (%):  [40 %] 40 % Set Rate:  [14 bmp] 14 bmp Vt Set:  [600 mL] 600 mL PEEP:  [8 cmH20] 8 cmH20 Plateau Pressure:  [24 cmH20-27 cmH20] 24 cmH20   Intake/Output Summary (Last 24 hours) at 01/01/2021 1237 Last data filed at 01/01/2021 0800 Gross per 24 hour  Intake 3827.89 ml  Output 3085 ml  Net 742.89 ml   Filed Weights   12/28/20 0500 12/29/20 0415  12/30/20 0344  Weight: 72 kg 70.5 kg 67.5 kg   Physical Exam: General: Cachectic, chornicall ill-appearing, no acute distress HENT: [place], AT, OP clear, MMM Neck: Trach in place, c/d/i Eyes: EOMI, no scleral icterus Respiratory: Diminished breath sounds bilaterally.  No crackles, wheezing or rales Cardiovascular: tachycardic, -M/R/G, no JVD GI: BS+, soft, nontender, ostomy in place with copious watery output.  Extremities: Improved anasarca,-tenderness Neuro: Lethargic, opens eyes to voice, no longer tracking or following commands, withdraws upper extremities to noxious stimuli GU: Urostomy tube in place.    Resolved Hospital Problem list   HCAP  Ecoli and achromobacter xylosoxidans s/p >5d Zosyn Assessment & Plan:   Ischemic bowel perf s/p ex-lap x 2 for resection S/p mucus fistula ileostomy, G tube placement  Complicated by intra-abdominal abscess s/p drain removal 1/13 - Completed 5 days of antibiotics post drain removal.   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 -Continue TPN, however he is not a good long term candidate per CCS -Continuing imodium, morphine, octreotide -Palliative care following. Appreciate the assistance  Acute on chronic hypoxemic respiratory failure Trach/Vent dependence  Bilateral pleural effusions due to malnutrition  Trach bleeding due to suction trauma High risk for needing continuous mechanical ventilation due to deconditioning in setting of severe protein calorie malnutrition. Replaced on vent on 1/17 -Attempt short trach collar trials with gradual increase in duration as tolerated.   AKI - increasing creatinine  with loss of enteral access Hypernatremia -Increase iv fluids, restart oral free water once access re-established.   Acute encephalopathy, multifactorial, critical illness, uremia.  Prior L MCA CVA New R sided facial droop/  flaccid RUE tone 1/6, CT head negative -Holding Plavix and aspirin due to history of bleeding  Severe deconditioning Contractures -RN for bedside PROM/AROM -PT consulted  Hypertension, uncontrolled Tachybradycardia syndrome -Continue telemetry monitoring -PRN metoprolol  Atrial fibrillation with RVR -Not on anticoagulation because of recent surgery  Seizures well controlled -Keppra  Bladder Cancer Also has history of penis prosthetic Given his severe anasarca urology did evaluate the patient on 1/12 to ensure prosthetic did not appear infected they felt no evidence of infection -Continue routine urostomy care -Keep penis and testicles elevated -Try to maximize nutrition  Anemia - stable -Transfuse for HB of <7.0  Hypokalemia Replete  Goals of Care Palliative Care Consulted. Family considering home with hospice vs LTACH Rediscussed GOC with daughter regarding cardiac arrythmias and patient requiring ventilator again. She wishes for patient to remain DNR but with aggressive management. She will contact her aunt for availability for a phone conference  Daily Goals Checklist  Pain/Anxiety/Delirium protocol (if indicated): PRNs VAP protocol (if indicated): bundle in place Blood pressure target: SBP<160 DVT prophylaxis:  subcutaneous heparin Nutrition Status: severe malnutrition, continue TPN and try elemental feeds GI prophylaxis: pantoprazole Urinary catheter: Guide hemodynamic management Glucose control: sliding scale Mobility/therapy needs: bedrest Code Status: No CPR but continue aggressive care otherwise  Family Communication: Palliative discussed [place] on 1/14. Updated sister 1/17 and informed her that the patient's condition both in terms of his severe acute illness and serious co-morbidities is unrecoverable in the long run and that current support is only effectively slowing the dying process but not able to bring him to recovery. Indicated that a transition to  comfort care is the best option to prevent undue suffering. Will ask palliative care to re-engage family, if no progress, may need to pursue futility policy. Disposition: ICU  Goals of Care:  Last date of multidisciplinary goals of care discussion: 1/14. On-going w/ family.  Family and staff present: Patient's daughter Ms. Alveta Heimlich Summary of discussion:  1/14- Palliative care met with family - -desired for ongoing aggressive medical care as offered, limited code blue Code Status: limited code blue no shock, no CPR  CRITICAL CARE Performed by: Kipp Brood   Total critical care time: 35 minutes  Critical care time was exclusive of separately billable procedures and treating other patients.  Critical care was necessary to treat or prevent imminent or life-threatening deterioration.  Critical care was time spent personally by me on the following activities: development of treatment plan with patient and/or surrogate as well as nursing, discussions with consultants, evaluation of patient's response to treatment, examination of patient, obtaining history from patient or surrogate, ordering and performing treatments and interventions, ordering and review of laboratory studies, ordering and review of radiographic studies, pulse oximetry, re-evaluation of patient's condition and participation in multidisciplinary rounds.  Kipp Brood, MD [facility] ICU Physician [place]  Pager: [phone] Mobile: [phone] After hours: [phone].

## 2021-01-01 NOTE — Progress Notes (Signed)
Physical Therapy Treatment Patient Details Name: Ronald Wolf MRN: [id number] DOB: [birth year] Today's Date: 01/01/2021    History of Present Illness Pt is a 75 y.o. male admitted 11/25/2020 with abdominal pain and nausea; workup revealed SBO, s/p NGT placement. Also with AKI. Pt is now s/p multiple ex lap with revisions and placement of G-tube. Pt self-extubated 3 times, is now s/p placement of trach on 12/29. PMH includes stroke, HTN, bladder CA s/p urostomy, seizure, Hep C, anxiety, depression.    PT Comments    Pt with eyes open during session, some visual tracking noted usually when PT located on pt's left. Pt with no command following observed today, and presents with significant UE/LE tightness RUE>LUE and LEs roughly symmetrically tight. Pt grimacing and increasingly restless during discomfort of PROM, PT giving pt several rest breaks from PROM to recover tachypnea and discomfort. Pt remains total care at this time.    Follow Up Recommendations  SNF;Supervision/Assistance - 24 hour     Equipment Recommendations  Hospital bed (if home with hospice)    Recommendations for Other Services       Precautions / Restrictions Precautions Precautions: Fall;Other (comment) Precaution Comments: trach, PEG, urostomy, colostomy, gastrostomy, bil prevalon boots Other Brace: bilateral prevalon boots    Mobility  Bed Mobility Overal bed mobility: Needs Assistance             General bed mobility comments: unable  Transfers                 General transfer comment: unable  Ambulation/Gait             General Gait Details: unable   Stairs             Wheelchair Mobility    Modified Rankin (Stroke Patients Only)       Balance                                            Cognition Arousal/Alertness: Lethargic Behavior During Therapy: Flat affect;Restless Overall Cognitive Status: Difficult to assess                                  General Comments: pt locking eyes and visually tracking briefly, usually squinting with R eye. No command following, grimacing and guarding during PROM. Pt reaching for trach collar and lines/leads when PT performing PROM, secondary to discomfort      Exercises General Exercises - Upper Extremity Shoulder Flexion: PROM;AAROM;Both;Supine;5 reps Elbow Flexion: PROM;Supine;Both;5 reps Elbow Extension: PROM;Supine;Both;5 reps (sustained hold) Wrist Flexion: PROM;Both;Supine;5 reps Wrist Extension: PROM;10 reps;Both;Supine General Exercises - Lower Extremity Ankle Circles/Pumps: PROM;Both;10 reps;Supine Heel Slides: PROM;Both;10 reps;Supine Hip ABduction/ADduction: PROM;Both;10 reps;Supine    General Comments General comments (skin integrity, edema, etc.): Vent settings - FiO2 40%/8 PEEP. HR 97-113 bpm, RRmax 37 breaths/min      Pertinent Vitals/Pain Pain Assessment: Faces Faces Pain Scale: Hurts little more Pain Location: grimacing with PROM RUE>LUE, LLE>RLE Pain Descriptors / Indicators: Grimacing Pain Intervention(s): Limited activity within patient's tolerance;Monitored during session;Repositioned    Home Living                      Prior Function            PT Goals (current goals can now be found in  the care plan section) Acute Rehab PT Goals Patient Stated Goal: not able to state PT Goal Formulation: Patient unable to participate in goal setting Time For Goal Achievement: 01/11/21 Potential to Achieve Goals: Poor Progress towards PT goals: Not progressing toward goals - comment (no command following, no mobility progression)    Frequency    Min 2X/week      PT Plan Current plan remains appropriate    Co-evaluation              AM-PAC PT "6 Clicks" Mobility   Outcome Measure  Help needed turning from your back to your side while in a flat bed without using bedrails?: Total Help needed moving from lying on your back to sitting on  the side of a flat bed without using bedrails?: Total Help needed moving to and from a bed to a chair (including a wheelchair)?: Total Help needed standing up from a chair using your arms (e.g., wheelchair or bedside chair)?: Total Help needed to walk in hospital room?: Total Help needed climbing 3-5 steps with a railing? : Total 6 Click Score: 6    End of Session Equipment Utilized During Treatment: Oxygen Activity Tolerance: No increased pain;Patient limited by fatigue Patient left: in bed;with call bell/phone within reach;with restraints reapplied (no bed alarm activated because would not set with multiple attempts. mitts donned) Nurse Communication: Mobility status PT Visit Diagnosis: Other abnormalities of gait and mobility (R26.89);Muscle weakness (generalized) (M62.81)     Time: 3299-2426 PT Time Calculation (min) (ACUTE ONLY): 21 min  Charges:  $Therapeutic Exercise: 8-22 mins                     Stacie Glaze, PT Acute Rehabilitation Services Pager [phone]  Office [phone]   Roxine Caddy E Ruffin Pyo 01/01/2021, 3:10 PM

## 2021-01-02 DIAGNOSIS — Z515 Encounter for palliative care: Secondary | ICD-10-CM | POA: Diagnosis not present

## 2021-01-02 DIAGNOSIS — Z8551 Personal history of malignant neoplasm of bladder: Secondary | ICD-10-CM | POA: Diagnosis not present

## 2021-01-02 DIAGNOSIS — Z8673 Personal history of transient ischemic attack (TIA), and cerebral infarction without residual deficits: Secondary | ICD-10-CM | POA: Diagnosis not present

## 2021-01-02 DIAGNOSIS — R188 Other ascites: Secondary | ICD-10-CM

## 2021-01-02 DIAGNOSIS — J9601 Acute respiratory failure with hypoxia: Secondary | ICD-10-CM | POA: Diagnosis not present

## 2021-01-02 DIAGNOSIS — J9611 Chronic respiratory failure with hypoxia: Secondary | ICD-10-CM | POA: Diagnosis not present

## 2021-01-02 DIAGNOSIS — J9612 Chronic respiratory failure with hypercapnia: Secondary | ICD-10-CM

## 2021-01-02 DIAGNOSIS — K56609 Unspecified intestinal obstruction, unspecified as to partial versus complete obstruction: Secondary | ICD-10-CM | POA: Diagnosis not present

## 2021-01-02 LAB — GLUCOSE, CAPILLARY
Glucose-Capillary: 139 mg/dL — ABNORMAL HIGH (ref 70–99)
Glucose-Capillary: 139 mg/dL — ABNORMAL HIGH (ref 70–99)
Glucose-Capillary: 142 mg/dL — ABNORMAL HIGH (ref 70–99)
Glucose-Capillary: 145 mg/dL — ABNORMAL HIGH (ref 70–99)
Glucose-Capillary: 157 mg/dL — ABNORMAL HIGH (ref 70–99)
Glucose-Capillary: 164 mg/dL — ABNORMAL HIGH (ref 70–99)

## 2021-01-02 MED ORDER — TRACE MINERALS CU-MN-SE-ZN 300-55-60-3000 MCG/ML IV SOLN
INTRAVENOUS | Status: AC
  Filled 2021-01-02: qty 800

## 2021-01-02 MED ORDER — OCTREOTIDE ACETATE 100 MCG/ML IJ SOLN
100.0000 ug | Freq: Two times a day (BID) | INTRAMUSCULAR | Status: DC
  Administered 2021-01-02 – 2021-01-14 (×26): 100 ug via SUBCUTANEOUS
  Filled 2021-01-02 (×34): qty 1

## 2021-01-02 NOTE — Plan of Care (Signed)
  Problem: Clinical Measurements: Goal: Will remain free from infection Outcome: Progressing   Problem: Pain Managment: Goal: General experience of comfort will improve Outcome: Progressing   Problem: Safety: Goal: Ability to remain free from injury will improve Outcome: Progressing   

## 2021-01-02 NOTE — Progress Notes (Signed)
Daily Progress Note   Patient Name: Ronald Wolf       Date: 01/02/2021 DOB: 1946/04/22  Age: 75 y.o. MRN#: [id number] Attending Physician: Kipp Brood, MD Primary Care Physician: Mosie Lukes, MD Admit Date: 11/21/2020  Reason for Consultation/Follow-up:  To discuss complex medical decision making related to patient's goals of care  Chart reviewed. PEG tube replaced.  Hydromorphone gtt started.  Since I last saw him on Monday.  He is thinner, his face appears diaphoretic and reddened particularly around the right eye area. He does not track me today.  He does not respond to me today. He is trached and on the vent. His ileostomy is putting out clear yellow fluid (similar in color to the TPN??). Debris noted in clear tubing.  Subjective:  Spoke with Ulonda (HCPOA) on the phone.  She explained the family wants him moved to a nursing facility,but they are having difficulty because of the nutrition.  I responded that because of the TPN and the Vent he requires LTAC rather than a regular SNF.  I mentioned that [facility] had declined placement, but I did not know if [facility] had been tried.  Family does not want him to be [place] out of state.  Assessment: Not making meaningful improvements.  Patient is slowing deteriorating.  Family would like for him to be placed in an appropriate facility.     Patient Profile/HPI:  75 y.o.malewith past medical history of bladder cancer s/p urostomy, seizure, disorder, right glottic cancer s/p excision, atrial fibrillation, tobacco abuse, CVA, hepatitis C, anxiety and depressionwho was admitted on 12/1/2021with small bowel obstruction. He had left [facility] from [facility]. Initially he was treated with conservative measures but on 12/7 he underwent  exploratory laproscopy and part of his small bowel was resected.He was taken back to the OR on 12/12 for further bowel resection. On 12/15 he had abdominal closure and ileostomy placement. During this time he has suffered from respiratory failure and been intubated 3 times. Currently he is not weaning from the vent and vent settings are too high to allow for tracheostomy placement. Patient is quite cachectic and remains on TPN. Albumin 1.6.   Length of Stay: 51   Vital Signs: BP 104/62    Pulse 88    Temp 97.8 F (36.6 C) (Axillary)  Resp 15    Ht 5\' 11"  (1.803 m)    Wt 65.3 kg    SpO2 99%    BMI 20.08 kg/m  SpO2: SpO2: 99 % O2 Device: O2 Device: Ventilator O2 Flow Rate: O2 Flow Rate (L/min): 5 L/min       Palliative Assessment/Data: 10%     Palliative Care Plan    Recommendations/Plan: Will check in with TOC to see if [facility] has evaluated him. Patient not making meaningful improvements.    Family unable to accept that he will be unable to recover from this illness and still pushing for full scope treatment with partial code.  Code Status:  Limited code  Prognosis: Days to weeks.  Patient is at high risk for acute decline and death.  Discharge Planning: LTAC or hospital death.   Thank you for allowing the Palliative Medicine Team to assist in the care of this patient.  Total time spent:   35 min.     Greater than 50%  of this time was spent counseling and coordinating care related to the above assessment and plan.  Florentina Jenny, PA-C Palliative Medicine  Please contact Palliative MedicineTeam phone at [phone] for questions and concerns between 7 am - 7 pm.   Please see AMION for individual provider pager numbers.

## 2021-01-02 NOTE — Progress Notes (Signed)
[facility] Progress Note  37 Days Post-Op  Subjective: CC-  No acute events over night.  G tube functioning after exchange in IR  Objective: Vital signs in last 24 hours: Temp:  [97.6 F (36.4 C)-98.7 F (37.1 C)] 97.8 F (36.6 C) (01/21 0800) Pulse Rate:  [80-109] 88 (01/21 0743) Resp:  [15-22] 17 (01/21 0743) BP: (102-137)/(54-74) 103/57 (01/21 0743) SpO2:  [95 %-100 %] 100 % (01/21 0743) FiO2 (%):  [40 %] 40 % (01/21 0743) Weight:  [65.3 kg] 65.3 kg (01/21 0400) Last BM Date: 01/02/21  Intake/Output from previous day: 01/20 0701 - 01/21 0700 In: 3124.9 [I.V.:2384.9; NG/GT:640; IV Piggyback:100] Out: 2700 [Urine:1100; Stool:1600] Intake/Output this shift: No intake/output data recorded.  PE: DXI:PJASNKNLZJ ill appearing HEENT: trach in place. On vent QBH:ALPF, ND, NT,openmidline woundwith fibrinous exudate and some tissue necrosis but fascia intact/ sutures visible,RUQstoma(mucus fistula)viable withscanttanoutput, LLQstoma(end ileostomy)pinkwithbilious appearing output, urostomy productive, g-tubesite cdi with TF running at 20cc/hr Neuro:opens eyes briefly but does not respond orfollow commands  Skin: warm and dry   Lab Results:  Recent Labs    12/31/20 0534  WBC 16.3*  HGB 8.5*  HCT 27.9*  PLT 259   BMET Recent Labs    12/31/20 0534 01/01/21 0618  NA 154* 152*  K 3.9 3.9  CL 117* 117*  CO2 28 27  GLUCOSE 203* 114*  BUN 109* 95*  CREATININE 1.38* 1.24  CALCIUM 8.8* 8.6*   PT/INR No results for input(s): LABPROT, INR in the last 72 hours. CMP     Component Value Date/Time   NA 152 (H) 01/01/2021 0618   NA 145 03/08/2016 1356   K 3.9 01/01/2021 0618   K 4.5 03/08/2016 1356   CL 117 (H) 01/01/2021 0618   CO2 27 01/01/2021 0618   CO2 28 03/08/2016 1356   GLUCOSE 114 (H) 01/01/2021 0618   GLUCOSE 98 03/08/2016 1356   BUN 95 (H) 01/01/2021 0618   BUN 14.2 03/08/2016 1356   CREATININE 1.24 01/01/2021 0618    CREATININE 0.9 03/08/2016 1356   CALCIUM 8.6 (L) 01/01/2021 0618   CALCIUM 9.8 03/08/2016 1356   PROT 6.8 01/01/2021 0618   PROT 8.2 03/08/2016 1356   ALBUMIN 1.6 (L) 01/01/2021 0618   ALBUMIN 3.6 03/08/2016 1356   AST 53 (H) 01/01/2021 0618   AST 42 (H) 03/08/2016 1356   ALT 49 (H) 01/01/2021 0618   ALT 43 03/08/2016 1356   ALKPHOS 130 (H) 01/01/2021 0618   ALKPHOS 79 03/08/2016 1356   BILITOT 1.2 01/01/2021 0618   BILITOT 0.74 03/08/2016 1356   GFRNONAA >60 01/01/2021 0618   GFRAA >60 12/31/2019 0221   Lipase     Component Value Date/Time   LIPASE 57 (H) 11/17/2020 1830    Vent Mode: PRVC FiO2 (%):  [40 %] 40 % Set Rate:  [14 bmp] 14 bmp Vt Set:  [600 mL] 600 mL PEEP:  [8 cmH20] 8 cmH20 Plateau Pressure:  [23 cmH20-27 cmH20] 23 cmH20    Studies/Results: IR GASTROSTOMY TUBE REMOVAL/REPAIR  Result Date: 12/31/2020 INDICATION: Poorly functioning feeding gastrostomy tube. Please perform fluoroscopic guided exchange. EXAM: FLUOROSCOPIC GUIDED REPLACEMENT OF GASTROSTOMY TUBE COMPARISON:  Abdominal radiograph with gastrostomy injection-10/30/2021. MEDICATIONS: None. CONTRAST:  16mL OMNIPAQUE IOHEXOL 300 MG/ML SOLN administered into the gastric lumen FLUOROSCOPY TIME:  6 seconds (1 mGy). COMPLICATIONS: None immediate. PROCEDURE: A timeout was performed prior to the initiation of the procedure. Existing gastrostomy tube was deflated and removed intact Next, a new 18 [place] into  it balloon retention gastrostomy tube was inserted via the chronic gastrostomy tube track. The balloon was inflated with approximately 10 cc of saline and the external disc was cinched. Contrast was injected and several spot fluoroscopic images were obtained in various obliquities to confirm appropriate intraluminal positioning. A dressing was applied. The patient tolerated the procedure well without immediate postprocedural complication. IMPRESSION: Successful fluoroscopic guided replacement of a new 18-French  gastrostomy tube. The new gastrostomy tube is ready for immediate use. Electronically Signed   By: Sandi Mariscal M.D.   On: 12/31/2020 14:23    Anti-infectives: Anti-infectives (From admission, onward)   Start     Dose/Rate Route Frequency Ordered Stop   12/21/20 1130  vancomycin (VANCOREADY) IVPB 1250 mg/250 mL  Status:  Discontinued        1,250 mg 166.7 mL/hr over 90 Minutes Intravenous Every 48 hours 12/21/20 1032 12/22/20 0848   12/21/20 1115  piperacillin-tazobactam (ZOSYN) IVPB 3.375 g        3.375 g 12.5 mL/hr over 240 Minutes Intravenous Every 8 hours 12/21/20 1022 12/31/20 0030   12/12/20 1800  Ampicillin-Sulbactam (UNASYN) 3 g in sodium chloride 0.9 % 100 mL IVPB  Status:  Discontinued        3 g 200 mL/hr over 30 Minutes Intravenous Every 6 hours 12/12/20 1101 12/21/20 1022   12/08/20 1300  vancomycin (VANCOCIN) IVPB 1000 mg/200 mL premix  Status:  Discontinued        1,000 mg 200 mL/hr over 60 Minutes Intravenous Every 24 hours 12/08/20 0845 12/12/20 1059   12/08/20 0100  vancomycin (VANCOREADY) IVPB 500 mg/100 mL  Status:  Discontinued        500 mg 100 mL/hr over 60 Minutes Intravenous Every 12 hours 12/07/20 1130 12/07/20 1337   12/07/20 1215  vancomycin (VANCOREADY) IVPB 1250 mg/250 mL        1,250 mg 166.7 mL/hr over 90 Minutes Intravenous  Once 12/07/20 1124 12/07/20 1336   12/06/20 1030  anidulafungin (ERAXIS) 100 mg in sodium chloride 0.9 % 100 mL IVPB  Status:  Discontinued       "Followed by" Linked Group Details   100 mg 78 mL/hr over 100 Minutes Intravenous Every 24 hours 12/05/20 0939 12/13/20 1230   12/05/20 1030  anidulafungin (ERAXIS) 200 mg in sodium chloride 0.9 % 200 mL IVPB       "Followed by" Linked Group Details   200 mg 78 mL/hr over 200 Minutes Intravenous  Once 12/05/20 0939 12/05/20 1618   12/04/20 1500  piperacillin-tazobactam (ZOSYN) IVPB 3.375 g  Status:  Discontinued        3.375 g 12.5 mL/hr over 240 Minutes Intravenous Every 8 hours  12/04/20 0818 12/12/20 1059   12/04/20 0915  piperacillin-tazobactam (ZOSYN) IVPB 3.375 g        3.375 g 100 mL/hr over 30 Minutes Intravenous  Once 12/04/20 0818 12/04/20 1207   11/30/2020 0945  cefoTEtan (CEFOTAN) 2 g in sodium chloride 0.9 % 100 mL IVPB  Status:  Discontinued        2 g 200 mL/hr over 30 Minutes Intravenous To Surgery 12/07/2020 0936 12/10/2020 1057   11/28/2020 0900  cefoTEtan (CEFOTAN) 2 g in sodium chloride 0.9 % 100 mL IVPB  Status:  Discontinued        2 g 200 mL/hr over 30 Minutes Intravenous  Once 11/13/2020 0833 11/17/2020 0936   11/19/20 1130  piperacillin-tazobactam (ZOSYN) IVPB 3.375 g        3.375 g 12.5  mL/hr over 240 Minutes Intravenous Every 8 hours 11/19/20 1017 11/25/2020 2359   11/19/20 1115  fluconazole (DIFLUCAN) IVPB 200 mg        200 mg 100 mL/hr over 60 Minutes Intravenous Every 24 hours 11/19/20 1017 11/21/20 1104   11/19/20 0915  Ampicillin-Sulbactam (UNASYN) 3 g in sodium chloride 0.9 % 100 mL IVPB  Status:  Discontinued        3 g 200 mL/hr over 30 Minutes Intravenous Every 12 hours 11/19/20 0826 11/19/20 0949       Assessment/Plan SBO -s/p ex lap with SBR, placement of a gastrostomy tube12/7 by Dr. Georgette Dover. Takeback 12/12 by Dr. Zenia Resides for necrotic SB with resection of additional 60-70cm of SB inclusive of prior anastomosis in discontinuity with open abdomen. - S/P ex lap, ileostomy, mucous fistula, closure 12/15 by Dr. Grandville Silos Malnourishment/FEN-Continue TNA. Continue TF at 20cc/hr, although patient is really not absorbing any enteral nutrition. Continue fiber,imodium, lomotil, tincture of opium.Pre-alb 18.7 (1/17) VDRF-s/p tracheostomy 12/29 ID-currently zosyn1/9>> (for E coli and Achromobacter xylosoxidans in Rcx, CCM also plans to continue this 5 days after IR drain removal 1/13).vancomycin 1/9>>1/10. unasyn 12/31>>1/9. Zosyn 1/19 - 1/18 per CCM Intra-abdominal abscess - seen on CT 12/23,s/p perc drain by IR 12/24. Culture with  Enterococcus faecalis and Bacteroides fragilis, beta lactamase positive.CT 1/9 showed no evidence of significant residual fluid collection and no new fluid collections. IRdrain injection study negative therefore drain removed 1/13 CV/AF RVR- having PVCs, several recent episodes of asystole  VTE-SQH Dispo - Patient was refused by [facility] due to poor long-term prognosis. Spoke with primary team, they have discussed with palliative who will reach out to family again today. Depending on their discussions may need to consider having ethics team involved.    LOS: 51 days    [facility] 01/02/2021, 11:11 AM Please see Amion for pager number during day hours 7:00am-4:30pm

## 2021-01-02 NOTE — Progress Notes (Signed)
NAME:  Ronald Wolf, MRN:  [id number], DOB:  [birth year], LOS: 59 ADMISSION DATE:  12/06/2020, CONSULTATION DATE:  12/7 REFERRING MD:  Cyndia Skeeters, CHIEF COMPLAINT:  Abdominal pain   Brief History:  Ronald Wolf is a 75 yo M w/ PMH of CVA, Seizures, Dementia, Bladder ca s/p urostomy, tobacco use, right glottic carcinoma s/p excision presenting with sbo. Found to have ischemic bowel requiring ex-lap 12/7.  Course complicated by septic shock, acute respiratory failure and AKI return to the OR 12/12 for necrotic small bowel resection of additional 70 cm inclusive of prior anastomosis 12/15 abdominal closure with ileostomy He has had a prolonged ICU stay with ventilator dependence, had a tracheostomy on 12/29 Remains in ICU for ventilator dependence and arrhythmias  Past Medical History:  CVA, Dementia, Bladder CA s/p nephrostomy, tobacco use, right glottic carcinoma s/p excision, seizures.  Significant Hospital Events:  11/19/2020 Admission 12/12/2020 OR 12/10/2020 Admit to ICU 12/10 Brief SVT with associated hypertension. Self limited Given fentanyl, metop and hydral for HTN 12/11 Good urine output with Lasix , failed extubation, reintubated within 2 hours due to inability to clear secretions 12/12 return OR for enteric leak-- found to have bowel necrosis. Left in discontinuity  12/13 self extubated, reintubated. On pressors. Family meeting with PCCM and CCS to discuss possible next steps.  12/15-was back in OR, abdominal closure, RUQ mucus fistula and L ileostomy  12/19: No significant overnight events, high ileostomy output 12/21: scheduled for family meeting. Worse hypernatremia, continued high ileostomy output.  Attempted PSV/CPAP wean, pulled volumes of high 100s -low/mid 200s with tachypnea. Placed back of full support.  12/22 ongoing high output GI/ ileostomy, TPN started; weaned 12/5 all day; off fentanyl gtt, remains on precedex  12/24 IR drain , 1 unit PRBC transfusion for hemoglobin 6.5 12/26 added  vancomycin for GPC in blood cultures and RIC suggesting MRSA 12/29 tracheostomy 12/31 Palliative discussion > family understanding that his situation is critical and he has not made much improvement  1/6 ongoing palliative conversations. No progress from pt standpoint. 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  1/11 seen by surgery, IR consulted to remove percutaneous drain.  Added octreotide in effort to try to decrease output 1/12: More fatigued back on vent 1/14: Opens eyes to voice and grimaces to pain, ?tracking. Severely deconditioned and suspect long recovery requiring vent dependence at this point. Reconsult PT for developing contractures 1/15: Tolerated trach collar 1/16: Tolerated trach collar, improved alertness 1/17: Overnight had bradycardia. This morning 3 episodes of asystole <30-60s reported per nursing Consults:  General Surgery PCCM Palliative Care  Procedures:  11/15/2020 Ex-lap, G-tube placement 12/7 PICC >> ETT 12/7 >> 12/11, >> 12/15- back to OR for abdominal closure, exploratory laparotomy  12/24 CT-guided right pelvic abscess drain 12/29 trach  Significant Diagnostic Tests:  CT abd/pelvis 12/2 > High-grade distal small bowel obstruction, possibly due to adhesion. Mild ascites and diffuse mesenteric edema. No evidence of focal inflammatory process or abscess. Small bilateral pleural effusions and mild right lower lobe Atelectasis. CT abdomen/pelvis 12/23 Interval development of a large loculated collection in the low anterior abdomen and pelvis measuring approximately 6.1 x 11.3 x12.1 cm in size subjacent to the  operative site. Few foci of internal gas  are present. , no frank spillage of enteric contrast  CT A/P 1/9 Success drainage of fluid of pelvic fluid collection, bilateral pleural effusions  CT H 1/6 > no acute intracranial abnormality. Chronic L MCA changes from prior L MCA infarct. Atrophic and chronic white matter ischemic changes.  Stable L basal ganglia infarcts  CT abd pelvis 1/10: pelvis abscess drained. BLL effusions w/ atx. Expected post op changes  Micro Data:  11/29/2020 COVID/Flu negative 12/7 BA: Few GNR rare GPC -- predominantly PMN  12/7 BCx> Neg 12/11 respiratory -no organism in tracheal aspirate 12/24 BC >> RIC showing both staph epidermis 1/4 12/24 abscess >> E. Faecalis, B fragilis (B lactamase) 12/27 trach aspirate>> normal flora 1/8  Trach Achromobacter xylosoxidans and E.coli 1/8 BCX> Neg  Antimicrobials:  Fluc 12/8>> 12/10 Zosyn 12/8> 12/12 cefotetain 12/15>> off Zosyn 12/23 >> 12/30 eraxis 12/24 >> 1/2 Vancomycin 12/26 >>12/31 Unasyn 12/31 >1/9 zosyn 1/9>> vanc 1/9 >>off  Interim History / Subjective:   Spoke to sister 1/18 and explained that patient was refused by [facility] due to poor long-term prognosis. Explained to her that patient's condition is not survivable and that current support is simply prolonging the dying process. PEG tube successfully replaced yesterday.  Started on hydromorphone infusion yesterday for visible discomfort with movement.   Objective   Blood pressure (!) 105/56, pulse 92, temperature 98.1 F (36.7 C), temperature source Axillary, resp. rate 15, height 5' 11"  (1.803 m), weight 65.3 kg, SpO2 100 %.    Vent Mode: PRVC FiO2 (%):  [40 %] 40 % Set Rate:  [14 bmp] 14 bmp Vt Set:  [600 mL] 600 mL PEEP:  [8 cmH20] 8 cmH20 Plateau Pressure:  [23 cmH20-28 cmH20] 28 cmH20   Intake/Output Summary (Last 24 hours) at 01/02/2021 1705 Last data filed at 01/02/2021 1600 Gross per 24 hour  Intake 4395.79 ml  Output 3575 ml  Net 820.79 ml   Filed Weights   12/29/20 0415 12/30/20 0344  01/02/21 0400  Weight: 70.5 kg 67.5 kg 65.3 kg   Physical Exam: General: Cachectic, chornicall ill-appearing, no acute distress HENT: [place], AT, OP clear, MMM Neck: Trach in place, c/d/i Eyes: EOMI, no scleral icterus Respiratory: Diminished breath sounds bilaterally.  No crackles, wheezing or rales Cardiovascular: tachycardic, -M/R/G, no JVD GI: BS+, soft, nontender, ostomy in place with copious watery output.  Extremities: Improved anasarca,-tenderness Neuro: Lethargic, opens eyes to voice, no longer tracking or following commands, withdraws upper extremities to noxious stimuli GU: Urostomy tube in place.    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 Acute on chronic hypoxemic respiratory failure Trach/Vent dependence  Bilateral pleural effusions due to malnutrition  Trach bleeding due to suction trauma High risk for needing continuous mechanical ventilation due to deconditioning in setting of severe protein calorie malnutrition. Replaced on vent on 1/17 Acute encephalopathy, multifactorial, critical illness, uremia.  Prior L MCA CVA Severe deconditioning Contractures Hypertension, uncontrolled Tachybradycardia syndrome Atrial fibrillation with RVR Seizures well controlled Bladder Cancer Also has history of penis prosthetic Given his severe anasarca urology did evaluate the patient on 1/12 to ensure prosthetic did not appear infected they felt  no evidence of infection Anemia - stable    Plan:  Continuing aggressive care including full mechanical ventilation.  Continue TPN at this time. No  improvement in ileostomy output despite maximal hypomotility agents given insufficient length of residual bowel. Palliative Care Consulted.  Patient has been refused by 2 LTAC's. Patient will continue to deteriorate as there is no option for cure of his current condition. Attempting to pursue transition to comfort care but family resistant. May be appropriate to engage ethics and futility policy given lack of progress, dismal prognosis for acute illness as well as significant irreversible comorbidities and evidence of patient discomfort and suffering. Rediscussed GOC with daughter regarding cardiac arrythmias and patient requiring ventilator again. She wishes for patient to remain DNR but with aggressive management.  Daily Goals Checklist  Pain/Anxiety/Delirium protocol (if indicated): PRNs VAP protocol (if indicated): bundle in place Blood pressure target: SBP<160 DVT prophylaxis:  subcutaneous heparin Nutrition Status: severe malnutrition, continue TPN and try elemental feeds GI prophylaxis: pantoprazole Urinary catheter: Guide hemodynamic management Glucose control: sliding scale Mobility/therapy needs: bedrest Code Status: No CPR but continue aggressive care otherwise  Family Communication: Palliative discussed Ronald Wolf on 1/14. Updated sister 1/17 and informed her that the patient's condition both in terms of his severe acute illness and serious co-morbidities is unrecoverable in the long run and that current support is only effectively slowing the dying process but not able to bring him to recovery. Indicated that a transition to comfort care is the best option to prevent undue suffering. Will ask palliative care to re-engage family, if no progress, may need to pursue futility policy. Disposition: ICU  Goals of Care:  Last date of multidisciplinary goals of care discussion: 1/14. On-going w/ family.  Family and staff present: Patient's daughter Ronald Wolf Summary of discussion:  1/14-  Palliative care met with family - -desired for ongoing aggressive medical care as offered, limited code blue Code Status: limited code blue no shock, no CPR  CRITICAL CARE Performed by: Kipp Brood   Total critical care time: 35 minutes  Critical care time was exclusive of separately billable procedures and treating other patients.  Critical care was necessary to treat or prevent imminent or life-threatening deterioration.  Critical care was time spent personally by me on the following activities: development of treatment plan with patient and/or surrogate as well as nursing, discussions with consultants, evaluation of patient's response to treatment, examination of patient, obtaining history from patient or surrogate, ordering and performing treatments and interventions, ordering and review of laboratory studies, ordering and review of radiographic studies, pulse oximetry, re-evaluation of patient's condition and participation in multidisciplinary rounds.  Kipp Brood, MD [facility] ICU Physician [place]  Pager: [phone] Mobile: [phone] After hours: [phone].

## 2021-01-02 NOTE — Progress Notes (Signed)
PHARMACY - TOTAL PARENTERAL NUTRITION CONSULT NOTE  Indication: Small bowel obstruction, prolonged ileus  Patient Measurements: Height: 5' 11"  (180.3 cm) Weight: 61.2 kg (134 lb 14.7 oz) IBW/kg (Calculated) : 75.3 TPN AdjBW (KG): 60.8 Body mass index is 18.82 kg/m. Usual Weight: 137 lbs  Assessment: 74 YOM presented 11/15/2020 with nausea, vomiting, abdominal pain after leaving AMA at [facility] with known SBO. Patient has been on bowel rest with NG tube decompression. Patient continued to have persistent SBO on imaging with no evidence of improvement, now s/p ex-lap with LoA, SBR and placement of Gtube on 12/01/2020. Pharmacy consulted for TPN management - patient received TPN 11/19/20 through 11/29/20.  Reconsulted to resume TPN on 12/2. TF currently at 30 mL/hr given high ostomy output and concern for tube feeds not absorbing. Patient on loperamide, lomotil, tincture of opium, morphine sulfate, octreotide and fiber without improvement in output.   Glucose / Insulin: no hx DM - CBGs controlled.  Utilized 13 units SSI in 24 hrs Electrolytes: 1/20 labs - Na/CL down 152/117 (FW 200 q4), K 3.9 (goal >/= 4 for ileus), CoCa slightly elevated at 10.52 (none in TPN), Mag down to 1.8 and 1gm given (goal >/= 2 for ileus) Renal:  LFTs / TGs: alk phos down 130, AST/ALT mildly elevated 53/49, tbili 1.2, TG WNL Prealbumin / albumin: prealbumin up to 18.7, albumin 1.6 Intake / Output; MIVF: incomplete charting on 1/20 - UOP 1180m, ileostomy 16058m(on fiber, opium tincture, lomotil, loperamide, morphine sulfate, octreotide and unlikely to improve per Surgery) GI Imaging:  12/11 DG Abd - stomach gas-filled, without distention, no obvious free air 12/23 CT abd pelvis - large loculated collection in low abd/pelvis, few foci of internal gas present, new RUQ ileostomy, thickened distal SB/colon, subtotal atelectatic collapse of LLL, partial collapse of RLL, small R pleural effusion, body wall edema Surgeries /  Procedures:  12/12: re-exploration and washout with new copious drainage from incision concerning for enteric leak vs. enterotomy >> now s/p, bowel necrosis resected 12/15: ex-lap with ileostomy with fistula creation, abdominal closure 12/24 pelvic abscess drain (removed 1/13) 1/19 PEG tube replaced  Central access: PICC placed 12/06/2020 TPN start date: 11/19/20>>11/29/20; restart 12/03/21  Nutritional Goals (updated per RD rec 12/30): 2300-2700 kCal, 120-135g protein, >2L fluid per day  Current Nutrition:  TPN (increased 75 >> 100 ml/hr on 1/17 to provide more free water) Vital AF 1.2 at 20 ml/hr (not advancing d/t high ileostomy output) [employer] TID - 2 charted given  Plan:  TPN with 21g/L SMOFlipid MWF only due to shortage   Continue TPN at goal rate of 100 ml/hr, providing a weekly average of 2328 kCal and 120g AA per day Electrolytes in TPN: Na removed 1/19, increase K to 4039mL on 1/20, Ca removed 1/16, add back Mag 4mE60m on 1/20, Phos increased to 5mmo28m on 1/19, max acetate - no change today Add standard MVI and trace elements + folic acid to TPN Continue moderate SSI Q4H FW 200ml 73m4H per MD Standard TPN labs on Mon/Thurs, repeat labs on Sat  Ronald Wolf, Ronald MarblemD, BCPS, BCCCP [email or web address], 9:41 AM

## 2021-01-03 DIAGNOSIS — Z8551 Personal history of malignant neoplasm of bladder: Secondary | ICD-10-CM | POA: Diagnosis not present

## 2021-01-03 DIAGNOSIS — Z515 Encounter for palliative care: Secondary | ICD-10-CM | POA: Diagnosis not present

## 2021-01-03 DIAGNOSIS — J9601 Acute respiratory failure with hypoxia: Secondary | ICD-10-CM | POA: Diagnosis not present

## 2021-01-03 DIAGNOSIS — K56609 Unspecified intestinal obstruction, unspecified as to partial versus complete obstruction: Secondary | ICD-10-CM | POA: Diagnosis not present

## 2021-01-03 LAB — GLUCOSE, CAPILLARY
Glucose-Capillary: 136 mg/dL — ABNORMAL HIGH (ref 70–99)
Glucose-Capillary: 147 mg/dL — ABNORMAL HIGH (ref 70–99)
Glucose-Capillary: 151 mg/dL — ABNORMAL HIGH (ref 70–99)
Glucose-Capillary: 153 mg/dL — ABNORMAL HIGH (ref 70–99)
Glucose-Capillary: 164 mg/dL — ABNORMAL HIGH (ref 70–99)
Glucose-Capillary: 165 mg/dL — ABNORMAL HIGH (ref 70–99)

## 2021-01-03 LAB — BASIC METABOLIC PANEL
Anion gap: 8 (ref 5–15)
BUN: 119 mg/dL — ABNORMAL HIGH (ref 8–23)
CO2: 24 mmol/L (ref 22–32)
Calcium: 8 mg/dL — ABNORMAL LOW (ref 8.9–10.3)
Chloride: 107 mmol/L (ref 98–111)
Creatinine, Ser: 1.41 mg/dL — ABNORMAL HIGH (ref 0.61–1.24)
GFR, Estimated: 52 mL/min — ABNORMAL LOW (ref >60)
Glucose, Bld: 164 mg/dL — ABNORMAL HIGH (ref 70–99)
Potassium: 5.1 mmol/L (ref 3.5–5.1)
Sodium: 139 mmol/L (ref 135–145)

## 2021-01-03 LAB — MAGNESIUM: Magnesium: 2.3 mg/dL (ref 1.7–2.4)

## 2021-01-03 LAB — PHOSPHORUS: Phosphorus: 3 mg/dL (ref 2.5–4.6)

## 2021-01-03 MED ORDER — TRACE MINERALS CU-MN-SE-ZN 300-55-60-3000 MCG/ML IV SOLN
INTRAVENOUS | Status: AC
  Filled 2021-01-03: qty 800

## 2021-01-03 NOTE — Progress Notes (Addendum)
NAME:  Ronald Wolf, MRN:  [id number], DOB:  05-02-1946, LOS: 74 ADMISSION DATE:  12/09/2020, CONSULTATION DATE:  12/7 REFERRING MD:  Cyndia Skeeters, CHIEF COMPLAINT:  Abdominal pain   Brief History:  Ronald Wolf is a 75 yo M w/ PMH of CVA, Seizures, Dementia, Bladder ca s/p urostomy, tobacco use, right glottic carcinoma s/p excision presenting with sbo. Found to have ischemic bowel requiring ex-lap 12/7.  Course complicated by septic shock, acute respiratory failure and AKI return to the OR 12/12 for necrotic small bowel resection of additional 70 cm inclusive of prior anastomosis 12/15 abdominal closure with ileostomy He has had a prolonged ICU stay with ventilator dependence, had a tracheostomy on 12/29 Remains in ICU for ventilator dependence and arrhythmias  Past Medical History:  CVA, Dementia, Bladder CA s/p nephrostomy, tobacco use, right glottic carcinoma s/p excision, seizures.  Significant Hospital Events:  12/01/2020 Admission 12/04/2020 OR 12/06/2020 Admit to ICU 12/10 Brief SVT with associated hypertension. Self limited Given fentanyl, metop and hydral for HTN 12/11 Good urine output with Lasix , failed extubation, reintubated within 2 hours due to inability to clear secretions 12/12 return OR for enteric leak-- found to have bowel necrosis. Left in discontinuity  12/13 self extubated, reintubated. On pressors. Family meeting with PCCM and CCS to discuss possible next steps.  12/15-was back in OR, abdominal closure, RUQ mucus fistula and L ileostomy  12/19: No significant overnight events, high ileostomy output 12/21: scheduled for family meeting. Worse hypernatremia, continued high ileostomy output.  Attempted PSV/CPAP wean, pulled volumes of high 100s -low/mid 200s with tachypnea. Placed back of full support.  12/22 ongoing high output GI/ ileostomy, TPN started; weaned 12/5 all day; off fentanyl gtt, remains on precedex  12/24 IR drain , 1 unit PRBC transfusion for hemoglobin 6.5 12/26 added  vancomycin for GPC in blood cultures and RIC suggesting MRSA 12/29 tracheostomy 12/31 Palliative discussion > family understanding that his situation is critical and he has not made much improvement  1/6 ongoing palliative conversations. No progress from pt standpoint. 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  1/11 seen by surgery, IR consulted to remove percutaneous drain.  Added octreotide in effort to try to decrease output 1/12: More fatigued back on vent 1/14: Opens eyes to voice and grimaces to pain, ?tracking. Severely deconditioned and suspect long recovery requiring vent dependence at this point. Reconsult PT for developing contractures 1/15: Tolerated trach collar 1/16: Tolerated trach collar, improved alertness 1/17: Overnight had bradycardia. This morning 3 episodes of asystole <30-60s reported per nursing Consults:  General Surgery PCCM Palliative Care  Procedures:  12/04/2020 Ex-lap, G-tube placement 12/7 PICC >> ETT 12/7 >> 12/11, >> 12/15- back to OR for abdominal closure, exploratory laparotomy  12/24 CT-guided right pelvic abscess drain 12/29 trach  Significant Diagnostic Tests:  CT abd/pelvis 12/2 > High-grade distal small bowel obstruction, possibly due to adhesion. Mild ascites and diffuse mesenteric edema. No evidence of focal inflammatory process or abscess. Small bilateral pleural effusions and mild right lower lobe Atelectasis. CT abdomen/pelvis 12/23 Interval development of a large loculated collection in the low anterior abdomen and pelvis measuring approximately 6.1 x 11.3 x12.1 cm in size subjacent to the  operative site. Few foci of internal gas  are present. , no frank spillage of enteric contrast  CT A/P 1/9 Success drainage of fluid of pelvic fluid collection, bilateral pleural effusions  CT H 1/6 > no acute intracranial abnormality. Chronic L MCA changes from prior L MCA infarct. Atrophic and chronic white matter ischemic changes.  Stable L basal ganglia infarcts  CT abd pelvis 1/10: pelvis abscess drained. BLL effusions w/ atx. Expected post op changes  Micro Data:  11/22/2020 COVID/Flu negative 12/7 BA: Few GNR rare GPC -- predominantly PMN  12/7 BCx> Neg 12/11 respiratory -no organism in tracheal aspirate 12/24 BC >> RIC showing both staph epidermis 1/4 12/24 abscess >> E. Faecalis, B fragilis (B lactamase) 12/27 trach aspirate>> normal flora 1/8  Trach Achromobacter xylosoxidans and E.coli 1/8 BCX> Neg  Antimicrobials:  Fluc 12/8>> 12/10 Zosyn 12/8> 12/12 cefotetain 12/15>> off Zosyn 12/23 >> 12/30 eraxis 12/24 >> 1/2 Vancomycin 12/26 >>12/31 Unasyn 12/31 >1/9 zosyn 1/9>> vanc 1/9 >>off  Interim History / Subjective:   Remains unchanged.  More comfortable on Dilaudid infusion.  Limited progress with regards to goals of care.  Spoke to surgery again yesterday who are in agreement that the patient has no potential for long-term recovery.  Objective   Blood pressure (!) 101/51, pulse 74, temperature 97.8 F (36.6 C), temperature source Axillary, resp. rate 18, height 5' 11" (1.803 m), weight 64.9 kg, SpO2 98 %.    Vent Mode: PRVC FiO2 (%):  [40 %] 40 % Set Rate:  [14 bmp] 14 bmp Vt Set:  [600 mL] 600 mL PEEP:  [5 cmH20-8 cmH20] 5 cmH20 Plateau Pressure:  [23 cmH20-28 cmH20] 23 cmH20   Intake/Output Summary (Last 24 hours) at 01/03/2021 1739 Last data filed at 01/03/2021 1600 Gross per 24 hour  Intake 3219.62 ml  Output 1975 ml  Net 1244.62 ml   Filed Weights   12/30/20 0344 01/02/21 0400 01/03/21 0500  Weight: 67.5 kg 65.3 kg 64.9 kg   Physical Exam: General: Cachectic, chronically l ill-appearing, no  acute distress HENT: [place], AT, OP clear, MMM Neck: Trach in place, c/d/i Eyes: EOMI, no scleral icterus Respiratory: Diminished breath sounds bilaterally.  No crackles, wheezing or rales Cardiovascular: tachycardic, -M/R/G, no JVD GI: BS+, soft, nontender, ostomy in place with copious watery output.  Extremities: Improved anasarca,-tenderness Neuro: Lethargic, opens eyes to voice, no longer tracking or following commands, withdraws upper extremities to noxious stimuli GU: Urostomy tube in place.    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 Acute on chronic hypoxemic respiratory failure Trach/Vent dependence  Bilateral pleural effusions due to malnutrition  Trach bleeding due to suction trauma High risk for needing continuous mechanical ventilation due to deconditioning in setting of severe protein calorie malnutrition. Replaced on vent on 1/17 Acute encephalopathy, multifactorial, critical illness, uremia.  Prior L MCA CVA Severe deconditioning Contractures Hypertension, uncontrolled Tachybradycardia syndrome Atrial fibrillation with RVR Seizures well controlled Bladder Cancer Also has history of penis prosthetic Given his severe anasarca urology did evaluate the patient on 1/12 to ensure prosthetic did not appear infected they felt no evidence of infection Anemia - stable    Plan:  Continuing aggressive care  including full mechanical ventilation.  Continue TPN at this time. No improvement in ileostomy output despite maximal hypomotility agents given insufficient length of residual bowel. Palliative  Care Consulted.  Patient has been refused by 2 LTAC's. Patient will continue to deteriorate as there is no option for cure of his current condition. Attempting to pursue transition to comfort care but family resistant. May be appropriate to engage ethics and futility policy given lack of progress, dismal prognosis for acute illness as well as significant irreversible comorbidities and evidence of patient discomfort and suffering. Rediscussed GOC with daughter regarding cardiac arrythmias and patient requiring ventilator again. She wishes for patient to remain DNR but with aggressive management.  Ethics consultation on Monday.  Daily Goals Checklist  Pain/Anxiety/Delirium protocol (if indicated): Dilaudid infusion VAP protocol (if indicated): bundle in place Blood pressure target: SBP<160 DVT prophylaxis:  subcutaneous heparin Nutrition Status: severe malnutrition, continue TPN and try elemental feeds GI prophylaxis: pantoprazole Urinary catheter: Guide hemodynamic management Glucose control: sliding scale Mobility/therapy needs: bedrest Code Status: No CPR but continue aggressive care otherwise  Family Communication: Palliative discussed [place] on 1/14. Updated sister 1/17 and informed her that the patient's condition both in terms of his severe acute illness and serious co-morbidities is unrecoverable in the long run and that current support is only effectively slowing the dying process but not able to bring him to recovery. Indicated that a transition to comfort care is the best option to prevent undue suffering. Will ask palliative care to re-engage family, if no progress, may need to pursue futility policy. Disposition: ICU  Goals of Care:  Last date of multidisciplinary goals of care discussion: 1/14. On-going w/ family.  Family and staff present: Patient's daughter Ms. Alveta Heimlich Summary of discussion:  1/14- Palliative care met with family - -desired for ongoing aggressive medical care as  offered, limited code blue Code Status: limited code blue no shock, no CPR    Kipp Brood, MD [facility] ICU Physician [place]  Pager: [phone] Mobile: [phone] After hours: [phone].

## 2021-01-03 NOTE — Progress Notes (Signed)
PHARMACY - TOTAL PARENTERAL NUTRITION CONSULT NOTE  Indication: Small bowel obstruction, prolonged ileus  Patient Measurements: Height: 5' 11"  (180.3 cm) Weight: 61.2 kg (134 lb 14.7 oz) IBW/kg (Calculated) : 75.3 TPN AdjBW (KG): 60.8 Body mass index is 18.82 kg/m. Usual Weight: 137 lbs  Assessment: 74 YOM presented 11/17/2020 with nausea, vomiting, abdominal pain after leaving AMA at [facility] with known SBO. Patient has been on bowel rest with NG tube decompression. Patient continued to have persistent SBO on imaging with no evidence of improvement, now s/p ex-lap with LoA, SBR and placement of Gtube on 11/28/2020. Pharmacy consulted for TPN management - patient received TPN 11/19/20 through 11/29/20.  Reconsulted to resume TPN on 12/2. TF currently at 30 mL/hr given high ostomy output and concern for tube feeds not absorbing. Patient on loperamide, lomotil, tincture of opium, morphine sulfate, octreotide and fiber without improvement in output.   Glucose / Insulin: no hx DM - CBGs controlled.  Utilized 14 units SSI in 24 hrs Electrolytes: 1/22 labs - Na/CL down 139/107 (FW 200 q4), K 5.1 (goal >/= 4 for ileus), CoCa 9.9 (none in TPN), Mag 2.3 (goal >/= 2 for ileus), Phos 3 Renal:  LFTs / TGs: alk phos down 130, AST/ALT mildly elevated 53/49, tbili 1.2, TG WNL Prealbumin / albumin: prealbumin up to 18.7, albumin 1.6 Intake / Output; MIVF: UOP 1764m, ileostomy 17067m(on fiber, opium tincture, lomotil, loperamide, morphine sulfate, octreotide and unlikely to improve per Surgery) GI Imaging:  12/11 DG Abd - stomach gas-filled, without distention, no obvious free air 12/23 CT abd pelvis - large loculated collection in low abd/pelvis, few foci of internal gas present, new RUQ ileostomy, thickened distal SB/colon, subtotal atelectatic collapse of LLL, partial collapse of RLL, small R pleural effusion, body wall edema Surgeries / Procedures:  12/12: re-exploration and washout with new copious  drainage from incision concerning for enteric leak vs. enterotomy >> now s/p, bowel necrosis resected 12/15: ex-lap with ileostomy with fistula creation, abdominal closure 12/24 pelvic abscess drain (removed 1/13) 1/19 PEG tube replaced  Central access: PICC placed 12/12/2020 TPN start date: 11/19/20>>11/29/20; restart 12/03/21  Nutritional Goals (updated per RD rec 12/30): 2300-2700 kCal, 120-135g protein, >2L fluid per day  Current Nutrition:  TPN (increased 75 >> 100 ml/hr on 1/17 to provide more free water) Vital AF 1.2 at 20 ml/hr (not advancing d/t high ileostomy output) Prosource TID - 3 charted given  Plan:  TPN with 21g/L SMOFlipid MWF only due to shortage   Continue TPN at goal rate of 100 ml/hr, providing a weekly average of 2328 kCal and 120g AA per day Electrolytes in TPN: Na removed 1/19, decrease K to 3544mL, Ca removed 1/16, add back Mag 4mE21m on 1/20, Phos increased to 5mmo7m on 1/19, max acetate - change to K made today Add standard MVI and trace elements + folic acid to TPN Continue moderate SSI Q4H FW 200ml 84m4H per MD Standard TPN labs on Mon/Thurs  Thank you for involving pharmacy in this patient's care.  JennifRenold GentamD, BCPS Clinical Pharmacist Clinical phone for 01/03/2021 until 3p is [phone] 7:21 AM  **Pharmacist phone directory can be found on [facility].[place] under [facility]

## 2021-01-04 DIAGNOSIS — K56609 Unspecified intestinal obstruction, unspecified as to partial versus complete obstruction: Secondary | ICD-10-CM | POA: Diagnosis not present

## 2021-01-04 DIAGNOSIS — Z8551 Personal history of malignant neoplasm of bladder: Secondary | ICD-10-CM | POA: Diagnosis not present

## 2021-01-04 DIAGNOSIS — Z515 Encounter for palliative care: Secondary | ICD-10-CM | POA: Diagnosis not present

## 2021-01-04 DIAGNOSIS — E43 Unspecified severe protein-calorie malnutrition: Secondary | ICD-10-CM | POA: Diagnosis not present

## 2021-01-04 DIAGNOSIS — J9601 Acute respiratory failure with hypoxia: Secondary | ICD-10-CM | POA: Diagnosis not present

## 2021-01-04 DIAGNOSIS — Z8673 Personal history of transient ischemic attack (TIA), and cerebral infarction without residual deficits: Secondary | ICD-10-CM | POA: Diagnosis not present

## 2021-01-04 LAB — GLUCOSE, CAPILLARY
Glucose-Capillary: 137 mg/dL — ABNORMAL HIGH (ref 70–99)
Glucose-Capillary: 154 mg/dL — ABNORMAL HIGH (ref 70–99)
Glucose-Capillary: 157 mg/dL — ABNORMAL HIGH (ref 70–99)
Glucose-Capillary: 143 mg/dL — ABNORMAL HIGH (ref 70–99)
Glucose-Capillary: 166 mg/dL — ABNORMAL HIGH (ref 70–99)

## 2021-01-04 MED ORDER — TRACE MINERALS CU-MN-SE-ZN 300-55-60-3000 MCG/ML IV SOLN
INTRAVENOUS | Status: AC
  Filled 2021-01-04: qty 800

## 2021-01-04 NOTE — Progress Notes (Signed)
PHARMACY - TOTAL PARENTERAL NUTRITION CONSULT NOTE  Indication: Small bowel obstruction, prolonged ileus  Patient Measurements: Height: 5' 11"  (180.3 cm) Weight: 61.2 kg (134 lb 14.7 oz) IBW/kg (Calculated) : 75.3 TPN AdjBW (KG): 60.8 Body mass index is 18.82 kg/m. Usual Weight: 137 lbs  Assessment: 74 YOM presented 12/03/2020 with nausea, vomiting, abdominal pain after leaving AMA at [facility] with known SBO. Patient has been on bowel rest with NG tube decompression. Patient continued to have persistent SBO on imaging with no evidence of improvement, now s/p ex-lap with LoA, SBR and placement of Gtube on 11/28/2020. Pharmacy consulted for TPN management - patient received TPN 11/19/20 through 11/29/20.  Reconsulted to resume TPN on 12/2. TF currently at 30 mL/hr given high ostomy output and concern for tube feeds not absorbing. Patient on loperamide, lomotil, tincture of opium, morphine sulfate, octreotide and fiber without improvement in output.   Glucose / Insulin: no hx DM - CBGs controlled.  Utilized 14 units SSI in 24 hrs Electrolytes: 1/22 labs - Na/CL down 139/107 (FW 200 q4), K 5.1 (goal >/= 4 for ileus), CoCa 9.9 (none in TPN), Mag 2.3 (goal >/= 2 for ileus), Phos 3 Renal:  LFTs / TGs: alk phos down 130, AST/ALT mildly elevated 53/49, tbili 1.2, TG WNL Prealbumin / albumin: prealbumin up to 18.7, albumin 1.6 Intake / Output; MIVF: UOP 2123m, ileostomy 17453m(on fiber, opium tincture, lomotil, loperamide, morphine sulfate, octreotide and unlikely to improve per Surgery) GI Imaging:  12/11 DG Abd - stomach gas-filled, without distention, no obvious free air 12/23 CT abd pelvis - large loculated collection in low abd/pelvis, few foci of internal gas present, new RUQ ileostomy, thickened distal SB/colon, subtotal atelectatic collapse of LLL, partial collapse of RLL, small R pleural effusion, body wall edema Surgeries / Procedures:  12/12: re-exploration and washout with new copious  drainage from incision concerning for enteric leak vs. enterotomy >> now s/p, bowel necrosis resected 12/15: ex-lap with ileostomy with fistula creation, abdominal closure 12/24 pelvic abscess drain (removed 1/13) 1/19 PEG tube replaced  Central access: PICC placed 11/17/2020 TPN start date: 11/19/20>>11/29/20; restart 12/03/21  Nutritional Goals (updated per RD rec 12/30): 2300-2700 kCal, 120-135g protein, >2L fluid per day  Current Nutrition:  TPN (increased 75 >> 100 ml/hr on 1/17 to provide more free water) Vital AF 1.2 at 20 ml/hr (not advancing d/t high ileostomy output) Prosource TID - 3 charted given  Plan:  TPN with 21g/L SMOFlipid MWF only due to shortage   Continue TPN at goal rate of 100 ml/hr, providing a weekly average of 2328 kCal and 120g AA per day Electrolytes in TPN: Na removed 1/19, decrease K to 3571mL on 1/22, Ca removed 1/16, add back Mag 4mE75m on 1/20, Phos increased to 5mmo62m on 1/19, max acetate - no changes 1/23 Add standard MVI and trace elements + folic acid to TPN Continue moderate SSI Q4H FW 200ml 18m4H per MD Standard TPN labs on Mon/Thurs Ethics consult planned for Mon  Thank you for involving pharmacy in this patient's care.  JennifRenold GentamD, BCPS Clinical Pharmacist Clinical phone for 01/04/2021 until 3p is [phone] 7:18 AM  **Pharmacist phone directory can be found on [facility]

## 2021-01-04 NOTE — Progress Notes (Signed)
NAME:  CHAS AXEL, MRN:  [id number], DOB:  04-07-1946, LOS: 27 ADMISSION DATE:  11/19/2020, CONSULTATION DATE:  12/7 REFERRING MD:  Cyndia Skeeters, CHIEF COMPLAINT:  Abdominal pain   Brief History:  Mr.Booz is a 75 yo M w/ PMH of CVA, Seizures, Dementia, Bladder ca s/p urostomy, tobacco use, right glottic carcinoma s/p excision presenting with sbo. Found to have ischemic bowel requiring ex-lap 12/7.  Course complicated by septic shock, acute respiratory failure and AKI return to the OR 12/12 for necrotic small bowel resection of additional 70 cm inclusive of prior anastomosis 12/15 abdominal closure with ileostomy He has had a prolonged ICU stay with ventilator dependence, had a tracheostomy on 12/29 Remains in ICU for ventilator dependence and arrhythmias  Past Medical History:  CVA, Dementia, Bladder CA s/p nephrostomy, tobacco use, right glottic carcinoma s/p excision, seizures.  Significant Hospital Events:  11/21/2020 Admission 11/15/2020 OR 11/30/2020 Admit to ICU 12/10 Brief SVT with associated hypertension. Self limited Given fentanyl, metop and hydral for HTN 12/11 Good urine output with Lasix , failed extubation, reintubated within 2 hours due to inability to clear secretions 12/12 return OR for enteric leak-- found to have bowel necrosis. Left in discontinuity  12/13 self extubated, reintubated. On pressors. Family meeting with PCCM and CCS to discuss possible next steps.  12/15-was back in OR, abdominal closure, RUQ mucus fistula and L ileostomy  12/19: No significant overnight events, high ileostomy output 12/21: scheduled for family meeting. Worse hypernatremia, continued high ileostomy output.  Attempted PSV/CPAP wean, pulled volumes of high 100s -low/mid 200s with tachypnea. Placed back of full support.  12/22 ongoing high output GI/ ileostomy, TPN started; weaned 12/5 all day; off fentanyl gtt, remains on precedex  12/24 IR drain , 1 unit PRBC transfusion for hemoglobin 6.5 12/26 added  vancomycin for GPC in blood cultures and RIC suggesting MRSA 12/29 tracheostomy 12/31 Palliative discussion > family understanding that his situation is critical and he has not made much improvement  1/6 ongoing palliative conversations. No progress from pt standpoint. 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  1/11 seen by surgery, IR consulted to remove percutaneous drain.  Added octreotide in effort to try to decrease output 1/12: More fatigued back on vent 1/14: Opens eyes to voice and grimaces to pain, ?tracking. Severely deconditioned and suspect long recovery requiring vent dependence at this point. Reconsult PT for developing contractures 1/15: Tolerated trach collar 1/16: Tolerated trach collar, improved alertness 1/17: Overnight had bradycardia. This morning 3 episodes of asystole <30-60s reported per nursing Consults:  General Surgery PCCM Palliative Care  Procedures:  11/30/2020 Ex-lap, G-tube placement 12/7 PICC >> ETT 12/7 >> 12/11, >> 12/15- back to OR for abdominal closure, exploratory laparotomy  12/24 CT-guided right pelvic abscess drain 12/29 trach  Significant Diagnostic Tests:  CT abd/pelvis 12/2 > High-grade distal small bowel obstruction, possibly due to adhesion. Mild ascites and diffuse mesenteric edema. No evidence of focal inflammatory process or abscess. Small bilateral pleural effusions and mild right lower lobe Atelectasis. CT abdomen/pelvis 12/23 Interval development of a large loculated collection in the low anterior abdomen and pelvis measuring approximately 6.1 x 11.3 x12.1 cm in size subjacent to the  operative site. Few foci of internal gas  are present. , no frank spillage of enteric contrast  CT A/P 1/9 Success drainage of fluid of pelvic fluid collection, bilateral pleural effusions  CT H 1/6 > no acute intracranial abnormality. Chronic L MCA changes from prior L MCA infarct. Atrophic and chronic white matter ischemic changes.  Stable L basal ganglia infarcts  CT abd pelvis 1/10: pelvis abscess drained. BLL effusions w/ atx. Expected post op changes  Micro Data:  12/10/2020 COVID/Flu negative 12/7 BA: Few GNR rare GPC -- predominantly PMN  12/7 BCx> Neg 12/11 respiratory -no organism in tracheal aspirate 12/24 BC >> RIC showing both staph epidermis 1/4 12/24 abscess >> E. Faecalis, B fragilis (B lactamase) 12/27 trach aspirate>> normal flora 1/8  Trach Achromobacter xylosoxidans and E.coli 1/8 BCX> Neg  Antimicrobials:  Fluc 12/8>> 12/10 Zosyn 12/8> 12/12 cefotetain 12/15>> off Zosyn 12/23 >> 12/30 eraxis 12/24 >> 1/2 Vancomycin 12/26 >>12/31 Unasyn 12/31 >1/9 zosyn 1/9>> vanc 1/9 >>off  Interim History / Subjective:   Remains unchanged.  More comfortable on Dilaudid infusion.  Limited progress with regards to goals of care.  Spoke to surgery again yesterday who are in agreement that the patient has no potential for long-term recovery.  Objective   Blood pressure (!) 99/50, pulse 79, temperature 98.6 F (37 C), temperature source Axillary, resp. rate 17, height 5' 11" (1.803 m), weight 67 kg, SpO2 100 %.    Vent Mode: CPAP;PSV FiO2 (%):  [40 %] 40 % Set Rate:  [14 bmp] 14 bmp Vt Set:  [600 mL] 600 mL PEEP:  [5 cmH20] 5 cmH20 Pressure Support:  [10 cmH20] 10 cmH20 Plateau Pressure:  [17 cmH20-24 cmH20] 17 cmH20   Intake/Output Summary (Last 24 hours) at 01/04/2021 1620 Last data filed at 01/04/2021 1300 Gross per 24 hour  Intake 3230.14 ml  Output 5090 ml  Net -1859.86 ml   Filed Weights   01/02/21 0400 01/03/21 0500 01/04/21 0500  Weight: 65.3 kg 64.9 kg 67 kg   Physical Exam: General: Cachectic,  chronically l ill-appearing, no acute distress HENT: [place], AT, OP clear, MMM Neck: Trach in place, c/d/i Eyes: EOMI, no scleral icterus Respiratory: Diminished breath sounds bilaterally.  No crackles, wheezing or rales. Tolerating PSV.  Cardiovascular: tachycardic, -M/R/G, no JVD GI: BS+, soft, nontender, ostomy in place with copious watery output.  Extremities: Improved anasarca,-tenderness Neuro: Lethargic, opens eyes to voice, no longer tracking or following commands, withdraws upper extremities to noxious stimuli GU: Urostomy tube in place.    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 Acute on chronic hypoxemic respiratory failure Trach/Vent dependence  Bilateral pleural effusions due to malnutrition  Trach bleeding due to suction trauma High risk for needing continuous mechanical ventilation due to deconditioning in setting of severe protein calorie malnutrition. Replaced on vent on 1/17 Acute encephalopathy, multifactorial, critical illness, uremia.  Prior L MCA CVA Severe deconditioning Contractures Hypertension, uncontrolled Tachybradycardia syndrome Atrial fibrillation with RVR Seizures well controlled Bladder Cancer Also has history of penis prosthetic Given his severe anasarca urology did evaluate the patient on 1/12 to ensure prosthetic did not appear infected they felt no evidence of infection Anemia -  stable    Plan:  Continuing aggressive care including full mechanical ventilation, but will attempt trach collar trials. Has previously failed due to profound weakness.   Continue TPN at  this time. No improvement in ileostomy output despite maximal hypomotility agents given insufficient length of residual bowel. Palliative Care Consulted.  Patient has been refused by 2 LTAC's. Patient will continue to deteriorate as there is no option for cure of his current condition. Attempting to pursue transition to comfort care but family resistant. May be appropriate to engage ethics and futility policy given lack of progress, dismal prognosis for acute illness as well as significant irreversible comorbidities and evidence of patient discomfort and suffering. Rediscussed GOC with daughter regarding cardiac arrythmias and patient requiring ventilator again. She wishes for patient to remain DNR but with aggressive management.  Ethics consultation on Monday.  Daily Goals Checklist  Pain/Anxiety/Delirium protocol (if indicated): Dilaudid infusion VAP protocol (if indicated): bundle in place Blood pressure target: SBP<160 DVT prophylaxis:  subcutaneous heparin Nutrition Status: severe malnutrition, continue TPN and try elemental feeds GI prophylaxis: pantoprazole Urinary catheter: Guide hemodynamic management Glucose control: sliding scale Mobility/therapy needs: bedrest Code Status: No CPR but continue aggressive care otherwise  Family Communication: Palliative discussed [place] on 1/14. Updated sister 1/17 and informed her that the patient's condition both in terms of his severe acute illness and serious co-morbidities is unrecoverable in the long run and that current support is only effectively slowing the dying process but not able to bring him to recovery. Indicated that a transition to comfort care is the best option to prevent undue suffering. Will ask palliative care to re-engage family, if no progress, may need to pursue futility policy. Disposition: ICU  Goals of Care:  Last date of multidisciplinary goals of care discussion: 1/14. On-going w/ family.  Family and staff present:  Patient's daughter Ms. Alveta Heimlich Summary of discussion:  1/14- Palliative care met with family - -desired for ongoing aggressive medical care as offered, limited code blue Code Status: limited code blue no shock, no CPR    Kipp Brood, MD [facility] ICU Physician [place]  Pager: [phone] Mobile: [phone] After hours: [phone].

## 2021-01-04 NOTE — Progress Notes (Signed)
35 mL of dilaudid from bag wasted with RN TK Chan in steri cycle to replace bag/tubing per protocol.

## 2021-01-04 NOTE — Plan of Care (Signed)
  Problem: Clinical Measurements: Goal: Respiratory complications will improve Outcome: Progressing   Problem: Coping: Goal: Level of anxiety will decrease Outcome: Progressing   Problem: Safety: Goal: Ability to remain free from injury will improve Outcome: Progressing   

## 2021-01-04 NOTE — Progress Notes (Signed)
MD placed patient on CPAP/PSV.

## 2021-01-04 NOTE — Progress Notes (Addendum)
Daily Progress Note   Patient Name: Ronald Wolf       Date: 01/04/2021 DOB: 12/28/1945  Age: 75 y.o. MRN#: [id number] Attending Physician: Ronald Brood, MD Primary Care Physician: Ronald Lukes, MD Admit Date: 11/17/2020  Reason for Consultation/Follow-up:To discuss complex medical decision making related to patient's goals of care. Discussed patient with bedside RN, Dr. Lynetta Wolf CCM as well as Dr. Windle Wolf, Ronald Wolf.  CCM, CCS, and PMT agree that despite providing TPN and Vent support the patient's condition is not improving.  Rather, it is simply slowing the dying process and the patient is suffering longer because of it.  We agreed to invite the family once again to speak together with [place] - before considering the futility policy with regard to TPN and Vent support.  Patient is currently maintained on a Dilaudid drip.  He receives boluses before any type of care as moving him or performing dressing changes appear to be very painful to him.   Subjective: I spoke with his daughter Ronald Wolf on the telephone.  She is the spokesperson for his 7 children who I understand are the decision maker.  I asked Ronald Wolf if we could sit down together in good faith and have a sharing of information between the doctors and the family.  If need be this could be done by teleconference but ideally it would be nice to have at least a couple of members of the family present in person.  I have asked for this several times in the past but have only been successful in speaking one-on-one with either Ronald Wolf or her aunt Ronald Wolf.   My conversations with Ronald Wolf are always very pleasant and professional.  She stated that she understood the reason for the request and that she would try to arrange a time with her family to talk.   I asked her to please try to make it Monday, January 24.   I gave her my cell phone number and asked her to contact me directly.  I asked Ronald Wolf if she had ever seen her father's abdomen since surgery.  She said that she had not.  I asked if she wanted to see it?  I would take a picture and send it to her as they are changing his dressings.  She asked me to take the picture and  text it to her cell phone.  I explained that it was difficult to look at and that she would be seeing multiple ostomies and a surgical wound.  I did not mention the PEG or the tracheostomy but will talk to her about that tomorrow.  Ronald Wolf commented that she wondered why he had not passed away yet.  She and the family feel that he is holding on for some reason.  I thought that was a very interesting comment.  It seems to me that the family does not want him to suffer.  They understand on some level that he will not improve from requiring life support (as multiple other family members have).  But they do not want to be responsible for withdraw of life supporting measures and ending his life.   Assessment: 75 year old male who has declined significantly over his 53-day hospitalization and is not showing signs of improvement or the ability to heal.   Patient Profile/HPI:  75 y.o.malewith past medical history of bladder cancer s/p urostomy, seizure, disorder, right glottic cancer s/p excision, atrial fibrillation, tobacco abuse, CVA, hepatitis C, anxiety and depressionwho was admitted on 02-Dec-2021with small bowel obstruction. He had left AMA from [facility]. Initially he was treated with conservative measures but on 12/7 he underwent exploratory laproscopy and part of his small bowel was resected.He was taken back to the OR on 12/12 for further bowel resection. On 12/15 he had abdominal closure and ileostomy placement. During this time he has suffered from respiratory failure and been intubated 3 times. At the time of  this initial consultation he is not weaning from the vent and vent settings are too high to allow for tracheostomy placement. Patient is quite cachectic and remains on TPN. Albumin 1.6.  Length of Stay: 53   Vital Signs: BP 125/61   Pulse 79   Temp 97.6 F (36.4 C) (Axillary)   Resp 17   Ht 5\' 11"  (1.803 m)   Wt 67 kg   SpO2 100%   BMI 20.60 kg/m  SpO2: SpO2: 100 % O2 Device: O2 Device: Ventilator O2 Flow Rate: O2 Flow Rate (L/min): 5 L/min       Palliative Assessment/Data: 10%     Palliative Care Plan    Recommendations/Plan:  PMT will continue to follow with you.    If the family agrees to meet it would be helpful if representatives from CCM, CCS, and PMT could join together, providing a clear message to the family.  If the family does not agree to meet or if agreement can not be reached then I would support pursuing the futility policy in support of providing comfort and dignity for Mr. Ronald Wolf.  Code Status:  Limited code - No CPR, ACLS meds or defibrillation  Prognosis:  Likely weeks to months with continued full scope support.  Hours to days if he shifts to full comfort measures.  Discharge Planning:  Anticipated Hospital Death  Care plan was discussed with daughter Ronald Wolf, and medical team as mentioned above.  Thank you for allowing the Palliative Medicine Team to assist in the care of this patient.  Total time spent:  45 min.     Greater than 50%  of this time was spent counseling and coordinating care related to the above assessment and plan.  Florentina Jenny, PA-C Palliative Medicine  Please contact Palliative MedicineTeam phone at [phone] for questions and concerns between 7 am - 7 pm.   Please see AMION for individual provider pager numbers.

## 2021-01-05 DIAGNOSIS — J9601 Acute respiratory failure with hypoxia: Secondary | ICD-10-CM | POA: Diagnosis not present

## 2021-01-05 LAB — GLUCOSE, CAPILLARY
Glucose-Capillary: 102 mg/dL — ABNORMAL HIGH (ref 70–99)
Glucose-Capillary: 130 mg/dL — ABNORMAL HIGH (ref 70–99)
Glucose-Capillary: 144 mg/dL — ABNORMAL HIGH (ref 70–99)
Glucose-Capillary: 154 mg/dL — ABNORMAL HIGH (ref 70–99)
Glucose-Capillary: 191 mg/dL — ABNORMAL HIGH (ref 70–99)
Glucose-Capillary: 84 mg/dL (ref 70–99)

## 2021-01-05 LAB — COMPREHENSIVE METABOLIC PANEL
ALT: 42 U/L (ref 0–44)
AST: 44 U/L — ABNORMAL HIGH (ref 15–41)
Albumin: 1.7 g/dL — ABNORMAL LOW (ref 3.5–5.0)
Alkaline Phosphatase: 133 U/L — ABNORMAL HIGH (ref 38–126)
Anion gap: 7 (ref 5–15)
BUN: 110 mg/dL — ABNORMAL HIGH (ref 8–23)
CO2: 24 mmol/L (ref 22–32)
Calcium: 8.4 mg/dL — ABNORMAL LOW (ref 8.9–10.3)
Chloride: 104 mmol/L (ref 98–111)
Creatinine, Ser: 1.16 mg/dL (ref 0.61–1.24)
GFR, Estimated: >60 mL/min (ref >60)
Glucose, Bld: 139 mg/dL — ABNORMAL HIGH (ref 70–99)
Potassium: 5.8 mmol/L — ABNORMAL HIGH (ref 3.5–5.1)
Sodium: 135 mmol/L (ref 135–145)
Total Bilirubin: 1 mg/dL (ref 0.3–1.2)
Total Protein: 7.2 g/dL (ref 6.5–8.1)

## 2021-01-05 LAB — PHOSPHORUS: Phosphorus: 3 mg/dL (ref 2.5–4.6)

## 2021-01-05 LAB — MAGNESIUM: Magnesium: 2.5 mg/dL — ABNORMAL HIGH (ref 1.7–2.4)

## 2021-01-05 LAB — TRIGLYCERIDES: Triglycerides: 75 mg/dL (ref <150)

## 2021-01-05 LAB — PREALBUMIN: Prealbumin: 25.6 mg/dL (ref 18–38)

## 2021-01-05 MED ORDER — DIPHENOXYLATE-ATROPINE 2.5-0.025 MG PO TABS
1.0000 | ORAL_TABLET | Freq: Four times a day (QID) | ORAL | Status: DC
  Administered 2021-01-05 – 2021-01-06 (×2): 1
  Filled 2021-01-05 (×2): qty 1

## 2021-01-05 MED ORDER — SODIUM ZIRCONIUM CYCLOSILICATE 10 G PO PACK
10.0000 g | PACK | Freq: Two times a day (BID) | ORAL | Status: AC
  Administered 2021-01-05 (×2): 10 g
  Filled 2021-01-05 (×2): qty 1

## 2021-01-05 MED ORDER — HYDROMORPHONE HCL 2 MG PO TABS
2.0000 mg | ORAL_TABLET | Freq: Four times a day (QID) | ORAL | Status: DC
  Administered 2021-01-05 – 2021-01-08 (×11): 2 mg
  Filled 2021-01-05 (×11): qty 1

## 2021-01-05 MED ORDER — TRACE MINERALS CU-MN-SE-ZN 300-55-60-3000 MCG/ML IV SOLN
INTRAVENOUS | Status: AC
  Filled 2021-01-05: qty 800

## 2021-01-05 MED ORDER — HYDROMORPHONE HCL 2 MG PO TABS
2.0000 mg | ORAL_TABLET | Freq: Four times a day (QID) | ORAL | Status: DC
  Administered 2021-01-05: 2 mg via ORAL
  Filled 2021-01-05: qty 1

## 2021-01-05 NOTE — Progress Notes (Signed)
Message left on Ulonda's voice mail.   Following up on my request for a family meeting (today if possible).   Left a message requesting a call back with my cell phone number.    I believe that the family is unable to make a decision for comfort measures and they believe that would be "giving up" on their father.  At some level they are hoping that if it is his time God will take him.  Florentina Jenny, PA-C Palliative Medicine Office:  [phone]

## 2021-01-05 NOTE — Progress Notes (Signed)
PHARMACY - TOTAL PARENTERAL NUTRITION CONSULT NOTE  Indication: Small bowel obstruction, prolonged ileus  Patient Measurements: Height: 5' 11"  (180.3 cm) Weight: 61.2 kg (134 lb 14.7 oz) IBW/kg (Calculated) : 75.3 TPN AdjBW (KG): 60.8 Body mass index is 18.82 kg/m. Usual Weight: 137 lbs  Assessment: 74 YOM presented 12/03/2020 with nausea, vomiting, abdominal pain after leaving AMA at [facility] with known SBO. Patient has been on bowel rest with NG tube decompression. Patient continued to have persistent SBO on imaging with no evidence of improvement, now s/p ex-lap with LoA, SBR and placement of Gtube on 12/08/2020. Pharmacy consulted for TPN management - patient received TPN 11/19/20 through 11/29/20.  Reconsulted to resume TPN on 12/2. TF currently at 20 mL/hr given high ostomy output and concern for tube feeds not absorbing. Patient on loperamide, lomotil, tincture of opium, morphine sulfate, octreotide and fiber without improvement in output.   Glucose / Insulin: no hx DM - CBGs controlled. Utilized 16 units SSI in 24 hrs Electrolytes: Na down to 135 (on FW 200 q4), K up to 5.8 (goal >/= 4 for ileus), CoCa ~10.2 (none in TPN), Mag up to 2.5 (goal >/= 2 for ileus) Renal: SCr down to 1.16, BUN down to 110 LFTs / TGs: alk phos down 130, AST mildly elevated - down to 44; ALT / Tbili / TG WNL Prealbumin / albumin: prealbumin up to 25.6, albumin 1.7 Intake / Output; MIVF: UOP 2760m, ileostomy 18348m(on fiber, opium tincture, lomotil, loperamide, morphine sulfate, octreotide and unlikely to improve per Surgery) GI Imaging: 12/11 DG Abd - stomach gas-filled, without distention, no obvious free air 12/23 CT abd pelvis - large loculated collection in low abd/pelvis, few foci of internal gas present, new RUQ ileostomy, thickened distal SB/colon, subtotal atelectatic collapse of LLL, partial collapse of RLL, small R pleural effusion, body wall edema Surgeries / Procedures: 12/12: re-exploration and  washout with new copious drainage from incision concerning for enteric leak vs. enterotomy >> now s/p, bowel necrosis resected 12/15: ex-lap with ileostomy with fistula creation, abdominal closure 12/24 pelvic abscess drain (removed 1/13) 1/19 PEG tube replaced  Central access: PICC placed 12/09/2020 TPN start date: 11/19/20>>11/29/20; restart 12/03/21  Nutritional Goals (updated per RD rec 12/30): 2300-2700 kCal, 120-135g protein, >2L fluid per day  Current Nutrition:  TPN (increased 75 >> 100 ml/hr on 1/17 to provide more free water) Vital AF 1.2 at 20 ml/hr (not advancing d/t high ileostomy output) [employer]source TID - 3 charted given  Plan:  1/24 AM - Hold current TPN bag with hyperkalemia. TPN to resume with next bag at standard hang time of 1800 - discussed with Dr. SmTamala Juliannd RN. Lokelma 10g PT BID ordered x2 per CCM  TPN with 21g/L SMOFlipid MWF only due to shortage   Continue TPN at goal rate of 100 ml/hr at 1800, providing a weekly average of 2328 kCal and 120g AA per day Electrolytes in TPN: add back Na 27m42mL (low amount required for current TPN components), remove K 1/24, Ca removed 1/16, reduce Mag to 2mE82m, Phos increased to 5mmo527m 1/19, max acetate Add standard MVI and trace elements + folic acid to TPN Continue moderate SSI Q4H and adjust as needed Continue FW 200ml 36m4H per MD Monitor standard TPN labs on Mon/Thurs - will check again 1/25 AM F/u GOC diLakotassions   Flem Enderle Arturo MortonmD, BCPS Please check AMION for all [facility] numbers Clinical Pharmacist 01/05/2021 7:52 AM

## 2021-01-05 NOTE — Progress Notes (Signed)
NAME:  JOEPH SZATKOWSKI, MRN:  [id number], DOB:  June 14, 1946, LOS: 81 ADMISSION DATE:  12/10/2020, CONSULTATION DATE:  12/7 REFERRING MD:  Cyndia Skeeters, CHIEF COMPLAINT:  Abdominal pain   Brief History:  Mr.Standifer is a 76 yo M w/ PMH of CVA, Seizures, Dementia, Bladder ca s/p urostomy, tobacco use, right glottic carcinoma s/p excision presenting with sbo. Found to have ischemic bowel requiring ex-lap 12/7.  Course complicated by septic shock, acute respiratory failure and AKI return to the OR 12/12 for necrotic small bowel resection of additional 70 cm inclusive of prior anastomosis 12/15 abdominal closure with ileostomy He has had a prolonged ICU stay with ventilator dependence, had a tracheostomy on 12/29 Remains in ICU for ventilator dependence and arrhythmias  Past Medical History:  CVA, Dementia, Bladder CA s/p nephrostomy, tobacco use, right glottic carcinoma s/p excision, seizures.  Significant Hospital Events:  11/14/2020 Admission 11/15/2020 OR 11/14/2020 Admit to ICU 12/10 Brief SVT with associated hypertension. Self limited Given fentanyl, metop and hydral for HTN 12/11 Good urine output with Lasix , failed extubation, reintubated within 2 hours due to inability to clear secretions 12/12 return OR for enteric leak-- found to have bowel necrosis. Left in discontinuity  12/13 self extubated, reintubated. On pressors. Family meeting with PCCM and CCS to discuss possible next steps.  12/15-was back in OR, abdominal closure, RUQ mucus fistula and L ileostomy  12/19: No significant overnight events, high ileostomy output 12/21: scheduled for family meeting. Worse hypernatremia, continued high ileostomy output.  Attempted PSV/CPAP wean, pulled volumes of high 100s -low/mid 200s with tachypnea. Placed back of full support.  12/22 ongoing high output GI/ ileostomy, TPN started; weaned 12/5 all day; off fentanyl gtt, remains on precedex  12/24 IR drain , 1 unit PRBC transfusion for hemoglobin 6.5 12/26 added  vancomycin for GPC in blood cultures and RIC suggesting MRSA 12/29 tracheostomy 12/31 Palliative discussion > family understanding that his situation is critical and he has not made much improvement  1/6 ongoing palliative conversations. No progress from pt standpoint. 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  1/11 seen by surgery, IR consulted to remove percutaneous drain.  Added octreotide in effort to try to decrease output 1/12: More fatigued back on vent 1/14: Opens eyes to voice and grimaces to pain, ?tracking. Severely deconditioned and suspect long recovery requiring vent dependence at this point. Reconsult PT for developing contractures 1/15: Tolerated trach collar 1/16: Tolerated trach collar, improved alertness 1/17: Overnight had bradycardia. This morning 3 episodes of asystole <30-60s reported per nursing   Consults:  General Surgery PCCM Palliative Care  Procedures:  11/14/2020 Ex-lap, G-tube placement 12/7 PICC >> ETT 12/7 >> 12/11, >> 12/15- back to OR for abdominal closure, exploratory laparotomy  12/24 CT-guided right pelvic abscess drain 12/29 trach  Significant Diagnostic Tests:  CT abd/pelvis 12/2 > High-grade distal small bowel obstruction, possibly due to adhesion. Mild ascites and diffuse mesenteric edema. No evidence of focal inflammatory process or abscess. Small bilateral pleural effusions and mild right lower lobe Atelectasis. CT abdomen/pelvis 12/23 Interval development of a large loculated collection in the low anterior abdomen and pelvis measuring approximately 6.1 x 11.3 x12.1 cm in size subjacent  to the operative site. Few foci of internal  gas are present. , no frank spillage of enteric contrast  CT A/P 1/9 Success drainage of fluid of pelvic fluid collection, bilateral pleural effusions  CT H 1/6 > no acute intracranial abnormality. Chronic L MCA changes from prior L MCA infarct. Atrophic and chronic white matter ischemic changes.  Stable L basal ganglia infarcts  CT abd pelvis 1/10: pelvis abscess drained. BLL effusions w/ atx. Expected post op changes  Micro Data:  11/30/2020 COVID/Flu negative 12/7 BA: Few GNR rare GPC -- predominantly PMN  12/7 BCx> Neg 12/11 respiratory -no organism in tracheal aspirate 12/24 BC >> RIC showing both staph epidermis 1/4 12/24 abscess >> E. Faecalis, B fragilis (B lactamase) 12/27 trach aspirate>> normal flora 1/8  Trach Achromobacter xylosoxidans and E.coli 1/8 BCX> Neg  Antimicrobials:  Fluc 12/8>> 12/10 Zosyn 12/8> 12/12 cefotetain 12/15>> off Zosyn 12/23 >> 12/30 eraxis 12/24 >> 1/2 Vancomycin 12/26 >>12/31 Unasyn 12/31 >1/9 zosyn 1/9>> vanc 1/9 >>off  Interim History / Subjective:  Seems comfortable on full vent support and dilaudid gtt.  Objective   Blood pressure (!) 142/61, pulse 82, temperature (!) 97.4 F (36.3 C), temperature source Oral, resp. rate 18, height 5\' 11"  (1.803 m), weight 65.9 kg, SpO2 100 %.    Vent Mode: PRVC FiO2 (%):  [40 %] 40 % Set Rate:  [14 bmp] 14 bmp Vt Set:  [600 mL] 600 mL PEEP:  [5 cmH20] 5 cmH20 Pressure Support:  [10 cmH20] 10 cmH20 Plateau Pressure:  [17 cmH20-24 cmH20] 23 cmH20   Intake/Output Summary (Last 24 hours) at 01/05/2021 0736 Last data filed at 01/05/2021 0700 Gross per 24 hour  Intake 4173.16 ml  Output 4800 ml  Net -626.84 ml   Filed Weights   01/03/21 0500 01/04/21 0500 01/05/21 0500  Weight: 64.9 kg 67 kg 65.9 kg   Physical Exam: Constitutional: chronically ill appearing thin man in NAD  Eyes: EOMI, pupils equal Ears, nose, mouth, and throat: trach in place, small thick secretions Cardiovascular: RRR, ext  warm Respiratory: shallow inspiratory effort, no wheezing Gastrointestinal: ileostomy in place, abdomen soft Skin: No rashes, normal turgor Neurologic: he moves upper ext purposefully but not to command Psychiatric: RASS -1 Ext: contracted with muscle wasting  Patient Lines/Drains/Airways Status    Active Line/Drains/Airways    Name Placement date Placement time Site Days   PICC Triple Lumen 11/30/2020 PICC Left Brachial 46 cm 0 cm 11/28/2020  1755  -- 48   Gastrostomy/Enterostomy Gastrostomy 18 Fr. LUQ 11/27/2020  1205  LUQ  48   Colostomy RUQ 11/28/2020  1031  RUQ  40   Ileostomy Standard (end) LLQ 12/01/2020  1027  LLQ  40   Urostomy Ureterostomy right RLQ --  --  RLQ  --   Incision (Closed) Abdomen Lower;Medial --  --  -- --   Tracheostomy Shiley Legacy 6 mm Cuffed 12/10/20  1500  6 mm  26   Pressure Injury 12/08/20 Coccyx Mid Stage 2 -  Partial thickness loss of dermis presenting as a shallow open injury with a red, pink wound bed without slough. 12/08/20  2000  -- 28   Pressure Injury 12/25/20 Neck Medial Stage 2 -  Partial thickness loss of dermis presenting as a shallow open injury with a red, pink wound bed without slough. under trach faceplate 12/25/20  2585  -- 11   Pressure Injury 01/04/21 Heel Right Deep Tissue Pressure Injury - Purple or maroon localized area of discolored intact  skin or blood-filled blister due to damage of underlying soft tissue from pressure and/or shear. 01/04/21  1200  -- 1   Pressure Injury 01/04/21 Heel Left Deep Tissue Pressure Injury - Purple or maroon localized area of discolored intact skin or blood-filled blister due to damage of underlying soft tissue from pressure and/or shear. 01/04/21  1200  -- 1         1800 ileostomy 2900 urostomy Net -73   Resolved Hospital Problem list   HCAP  Ecoli and achromobacter xylosoxidans s/p >5d Zosyn  Assessment & Plan:   Ischemic bowel perf s/p ex-lap x 2 for resection S/p mucus fistula ileostomy, G tube  placement  Complicated by intra-abdominal abscess s/p drain removal 1/13 Severe protein calorie malnutrition High ileostomy output (>1200cc/dail) 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 Acute on chronic hypoxemic respiratory failure Trach/Vent dependence  Bilateral pleural effusions due to malnutrition  Trach bleeding due to suction trauma High risk for needing continuous mechanical ventilation due to deconditioning in setting of severe protein calorie malnutrition. Replaced on vent on 1/17 Acute encephalopathy, multifactorial, critical illness, uremia.  Prior L MCA CVA Severe deconditioning Contractures Hypertension, uncontrolled Tachybradycardia syndrome Atrial fibrillation with RVR Seizures well controlled Bladder Cancer Also has history of penis prosthetic Given his severe anasarca urology did evaluate the patient on 1/12 to ensure prosthetic did not appear infected they felt no evidence of infection Anemia - stable    Plan: - PS trials today, too weak for TC - Lokelma for hyperkalemia - Try to keep up with ileostomy output, all medical measures to date have failed - TPN continued - Patient is DNR but all other measures to be pursued - Continue family discussions as able - Trial of oral dilaudid but not sure he will absorb - Getting close to medical futility but do not technically meet definition at present   Best practice:  Diet: TPN Pain/Anxiety/Delirium protocol (if indicated): dilaudid gtt VAP protocol (if indicated): in place DVT prophylaxis: heparin GI prophylaxis: ppi Glucose control: ssi Mobility: br Code Status: vent okay, no compressions/shocks Family Communication: pending Disposition: ICU pending vent liberation   Goals of Care:  Daily with palliative care, appreciate help   Patient critically ill due to respiratory failure Interventions to address this today mechanical ventilation titration Risk of  deterioration without these interventions is high  I personally spent 34 minutes providing critical care not including any separately billable procedures  Erskine Emery MD [facility] 01/05/2021 7:48 AM Personal pager: [phone] If unanswered, please page CCM On-call: [phone]

## 2021-01-05 NOTE — Progress Notes (Signed)
Pt transported from 4N30 to 2H14 without any complications.

## 2021-01-05 NOTE — TOC Progression Note (Signed)
Transition of Care [facility]) - Progression Note    Patient Details  Name: Ronald Wolf MRN: [id number] Date of Birth: 02/03/1946  [facility] of [facility] CM/SW Contact  Oren Section Cleta Alberts, RN Phone Number: 01/05/2021, 5:37 PM  Clinical Narrative:   Pt transferred to [facility] today.  He has been denied by both LTAC hospitals in the area, and is not appropriate for SNF due to his complex medical issues.  Palliative Medicine Team continues discussions with family, but they are not willing to agree on goal of care. TOC Team will continue to follow progress.      Expected Discharge Plan: Long Term Acute Care (LTAC) Barriers to Discharge: Continued Medical Work up  Expected Discharge Plan and Services Expected Discharge Plan: [place] (LTAC)                                               Social Determinants of Health (SDOH) Interventions    Readmission Risk Interventions No flowsheet data found.  Reinaldo Raddle, RN, BSN  Trauma/Neuro ICU Case Manager [phone]

## 2021-01-05 NOTE — Progress Notes (Signed)
[facility] Progress Note  40 Days Post-Op  Subjective: CC-  No acute events over night.  G tube functioning Ileostomy 1875cc/24hrs  Objective: Vital signs in last 24 hours: Temp:  [97.4 F (36.3 C)-98.6 F (37 C)] 98 F (36.7 C) (01/24 0800) Pulse Rate:  [72-93] 78 (01/24 0800) Resp:  [16-29] 17 (01/24 0800) BP: (99-161)/(50-70) 121/65 (01/24 0800) SpO2:  [95 %-100 %] 100 % (01/24 0846) FiO2 (%):  [40 %] 40 % (01/24 0846) Weight:  [65.9 kg] 65.9 kg (01/24 0500) Last BM Date: 01/04/21  Intake/Output from previous day: 01/23 0701 - 01/24 0700 In: 4173.2 [I.V.:2493.2; NG/GT:1680] Out: 4800 [Urine:2925; VWUJW:1191] Intake/Output this shift: Total I/O In: 123.9 [I.V.:103.9; NG/GT:20] Out: -   PE: YNW:GNFAOZHYQM ill appearing HEENT: trach in place. On vent VHQ:IONG, ND, NT,openmidline woundwith some fibrinous exudate and some tissue necrosis but fascia intact/ sutures visible,RUQstoma(mucus fistula)viable withscanttanoutput, LLQstoma(end ileostomy)pinkwithbilious appearing output, urostomy productive, g-tubesite cdi Neuro:opens eyes briefly but does not respond orfollow commands  Skin: warm and dry   Lab Results:  No results for input(s): WBC, HGB, HCT, PLT in the last 72 hours. BMET Recent Labs    01/03/21 0321 01/05/21 0433  NA 139 135  K 5.1 5.8*  CL 107 104  CO2 24 24  GLUCOSE 164* 139*  BUN 119* 110*  CREATININE 1.41* 1.16  CALCIUM 8.0* 8.4*   PT/INR No results for input(s): LABPROT, INR in the last 72 hours. CMP     Component Value Date/Time   NA 135 01/05/2021 0433   NA 145 03/08/2016 1356   K 5.8 (H) 01/05/2021 0433   K 4.5 03/08/2016 1356   CL 104 01/05/2021 0433   CO2 24 01/05/2021 0433   CO2 28 03/08/2016 1356   GLUCOSE 139 (H) 01/05/2021 0433   GLUCOSE 98 03/08/2016 1356   BUN 110 (H) 01/05/2021 0433   BUN 14.2 03/08/2016 1356   CREATININE 1.16 01/05/2021 0433   CREATININE 0.9 03/08/2016 1356    CALCIUM 8.4 (L) 01/05/2021 0433   CALCIUM 9.8 03/08/2016 1356   PROT 7.2 01/05/2021 0433   PROT 8.2 03/08/2016 1356   ALBUMIN 1.7 (L) 01/05/2021 0433   ALBUMIN 3.6 03/08/2016 1356   AST 44 (H) 01/05/2021 0433   AST 42 (H) 03/08/2016 1356   ALT 42 01/05/2021 0433   ALT 43 03/08/2016 1356   ALKPHOS 133 (H) 01/05/2021 0433   ALKPHOS 79 03/08/2016 1356   BILITOT 1.0 01/05/2021 0433   BILITOT 0.74 03/08/2016 1356   GFRNONAA >60 01/05/2021 0433   GFRAA >60 12/31/2019 0221   Lipase     Component Value Date/Time   LIPASE 57 (H) 11/19/2020 1830    Vent Mode: PSV;CPAP FiO2 (%):  [40 %] 40 % Set Rate:  [14 bmp] 14 bmp Vt Set:  [600 mL] 600 mL PEEP:  [5 cmH20] 5 cmH20 Pressure Support:  [10 EXB28-41 cmH20] 20 cmH20 Plateau Pressure:  [17 cmH20-24 cmH20] 23 cmH20    Studies/Results: No results found.  Anti-infectives: Anti-infectives (From admission, onward)   Start     Dose/Rate Route Frequency Ordered Stop   12/21/20 1130  vancomycin (VANCOREADY) IVPB 1250 mg/250 mL  Status:  Discontinued        1,250 mg 166.7 mL/hr over 90 Minutes Intravenous Every 48 hours 12/21/20 1032 12/22/20 0848   12/21/20 1115  piperacillin-tazobactam (ZOSYN) IVPB 3.375 g        3.375 g 12.5 mL/hr over 240 Minutes Intravenous Every 8 hours 12/21/20 1022 12/31/20  0030   12/12/20 1800  Ampicillin-Sulbactam (UNASYN) 3 g in sodium chloride 0.9 % 100 mL IVPB  Status:  Discontinued        3 g 200 mL/hr over 30 Minutes Intravenous Every 6 hours 12/12/20 1101 12/21/20 1022   12/08/20 1300  vancomycin (VANCOCIN) IVPB 1000 mg/200 mL premix  Status:  Discontinued        1,000 mg 200 mL/hr over 60 Minutes Intravenous Every 24 hours 12/08/20 0845 12/12/20 1059   12/08/20 0100  vancomycin (VANCOREADY) IVPB 500 mg/100 mL  Status:  Discontinued        500 mg 100 mL/hr over 60 Minutes Intravenous Every 12 hours 12/07/20 1130 12/07/20 1337   12/07/20 1215  vancomycin (VANCOREADY) IVPB 1250 mg/250 mL        1,250  mg 166.7 mL/hr over 90 Minutes Intravenous  Once 12/07/20 1124 12/07/20 1336   12/06/20 1030  anidulafungin (ERAXIS) 100 mg in sodium chloride 0.9 % 100 mL IVPB  Status:  Discontinued       "Followed by" Linked Group Details   100 mg 78 mL/hr over 100 Minutes Intravenous Every 24 hours 12/05/20 0939 12/13/20 1230   12/05/20 1030  anidulafungin (ERAXIS) 200 mg in sodium chloride 0.9 % 200 mL IVPB       "Followed by" Linked Group Details   200 mg 78 mL/hr over 200 Minutes Intravenous  Once 12/05/20 0939 12/05/20 1618   12/04/20 1500  piperacillin-tazobactam (ZOSYN) IVPB 3.375 g  Status:  Discontinued        3.375 g 12.5 mL/hr over 240 Minutes Intravenous Every 8 hours 12/04/20 0818 12/12/20 1059   12/04/20 0915  piperacillin-tazobactam (ZOSYN) IVPB 3.375 g        3.375 g 100 mL/hr over 30 Minutes Intravenous  Once 12/04/20 0818 12/04/20 1207   11/22/2020 0945  cefoTEtan (CEFOTAN) 2 g in sodium chloride 0.9 % 100 mL IVPB  Status:  Discontinued        2 g 200 mL/hr over 30 Minutes Intravenous To Surgery 12/01/2020 0936 11/29/2020 1057   11/25/2020 0900  cefoTEtan (CEFOTAN) 2 g in sodium chloride 0.9 % 100 mL IVPB  Status:  Discontinued        2 g 200 mL/hr over 30 Minutes Intravenous  Once 12/06/2020 0833 11/27/2020 0936   11/19/20 1130  piperacillin-tazobactam (ZOSYN) IVPB 3.375 g        3.375 g 12.5 mL/hr over 240 Minutes Intravenous Every 8 hours 11/19/20 1017 12/02/2020 2359   11/19/20 1115  fluconazole (DIFLUCAN) IVPB 200 mg        200 mg 100 mL/hr over 60 Minutes Intravenous Every 24 hours 11/19/20 1017 11/21/20 1104   11/19/20 0915  Ampicillin-Sulbactam (UNASYN) 3 g in sodium chloride 0.9 % 100 mL IVPB  Status:  Discontinued        3 g 200 mL/hr over 30 Minutes Intravenous Every 12 hours 11/19/20 0826 11/19/20 0949       Assessment/Plan SBO -s/p ex lap with SBR, placement of a gastrostomy tube12/7 by Dr. Georgette Dover. Takeback 12/12 by Dr. Zenia Resides for necrotic SB with resection of additional  60-70cm of SB inclusive of prior anastomosis in discontinuity with open abdomen. - S/P ex lap, ileostomy, mucous fistula, closure 12/15 by Dr. Grandville Silos Malnourishment/FEN-Continue TNA. Continue TF, although patient is really not absorbing any enteral nutrition. Continue fiber,imodium, lomotil, tincture of opium. VDRF-s/p tracheostomy 12/29 ID-currently zosyn1/9>> (for E coli and Achromobacter xylosoxidans in Rcx, CCM also plans to continue this 5  days after IR drain removal 1/13).vancomycin 1/9>>1/10. unasyn 12/31>>1/9. Zosyn 1/19 - 1/18 per CCM Intra-abdominal abscess - seen on CT 12/23,s/p perc drain by IR 12/24. Culture with Enterococcus faecalis and Bacteroides fragilis, beta lactamase positive.CT 1/9 showed no evidence of significant residual fluid collection and no new fluid collections. IRdrain injection study negative therefore drain removed 1/13 CV/AF RVR- having PVCs, several recent episodes of asystole  VTE-SQH Dispo - Patient was refused by [facility] due to poor long-term prognosis. Palliative care is involved. We are available to support with family discussions as well.   LOS: 68 days   Nadeen Landau, MD [facility] Use [email or web address] to contact on call provider

## 2021-01-05 NOTE — Progress Notes (Signed)
Called pt's daughter, [name], letting her know her father was transferred to [phone].

## 2021-01-06 DIAGNOSIS — G9340 Encephalopathy, unspecified: Secondary | ICD-10-CM | POA: Diagnosis not present

## 2021-01-06 DIAGNOSIS — I10 Essential (primary) hypertension: Secondary | ICD-10-CM | POA: Diagnosis not present

## 2021-01-06 DIAGNOSIS — N179 Acute kidney failure, unspecified: Secondary | ICD-10-CM | POA: Diagnosis not present

## 2021-01-06 DIAGNOSIS — J9601 Acute respiratory failure with hypoxia: Secondary | ICD-10-CM | POA: Diagnosis not present

## 2021-01-06 LAB — BASIC METABOLIC PANEL
Anion gap: 9 (ref 5–15)
BUN: 96 mg/dL — ABNORMAL HIGH (ref 8–23)
CO2: 23 mmol/L (ref 22–32)
Calcium: 8.3 mg/dL — ABNORMAL LOW (ref 8.9–10.3)
Chloride: 102 mmol/L (ref 98–111)
Creatinine, Ser: 1.02 mg/dL (ref 0.61–1.24)
GFR, Estimated: >60 mL/min (ref >60)
Glucose, Bld: 138 mg/dL — ABNORMAL HIGH (ref 70–99)
Potassium: 4.8 mmol/L (ref 3.5–5.1)
Sodium: 134 mmol/L — ABNORMAL LOW (ref 135–145)

## 2021-01-06 LAB — GLUCOSE, CAPILLARY
Glucose-Capillary: 132 mg/dL — ABNORMAL HIGH (ref 70–99)
Glucose-Capillary: 147 mg/dL — ABNORMAL HIGH (ref 70–99)
Glucose-Capillary: 149 mg/dL — ABNORMAL HIGH (ref 70–99)
Glucose-Capillary: 150 mg/dL — ABNORMAL HIGH (ref 70–99)
Glucose-Capillary: 163 mg/dL — ABNORMAL HIGH (ref 70–99)
Glucose-Capillary: 182 mg/dL — ABNORMAL HIGH (ref 70–99)
Glucose-Capillary: 194 mg/dL — ABNORMAL HIGH (ref 70–99)
Glucose-Capillary: 90 mg/dL (ref 70–99)

## 2021-01-06 LAB — PHOSPHORUS: Phosphorus: 3.5 mg/dL (ref 2.5–4.6)

## 2021-01-06 LAB — CBC
HCT: 24.7 % — ABNORMAL LOW (ref 39.0–52.0)
Hemoglobin: 7.6 g/dL — ABNORMAL LOW (ref 13.0–17.0)
MCH: 32.5 pg (ref 26.0–34.0)
MCHC: 30.8 g/dL (ref 30.0–36.0)
MCV: 105.6 fL — ABNORMAL HIGH (ref 80.0–100.0)
Platelets: 281 10*3/uL (ref 150–400)
RBC: 2.34 MIL/uL — ABNORMAL LOW (ref 4.22–5.81)
RDW: 21.1 % — ABNORMAL HIGH (ref 11.5–15.5)
WBC: 9.9 10*3/uL (ref 4.0–10.5)
nRBC: 0.2 % (ref 0.0–0.2)

## 2021-01-06 LAB — MAGNESIUM: Magnesium: 2.5 mg/dL — ABNORMAL HIGH (ref 1.7–2.4)

## 2021-01-06 MED ORDER — DIPHENOXYLATE-ATROPINE 2.5-0.025 MG PO TABS
2.0000 | ORAL_TABLET | Freq: Four times a day (QID) | ORAL | Status: DC
  Administered 2021-01-06 – 2021-01-14 (×34): 2
  Filled 2021-01-06 (×35): qty 2

## 2021-01-06 MED ORDER — NUTRISOURCE FIBER PO PACK
1.0000 | PACK | Freq: Three times a day (TID) | ORAL | Status: DC
  Administered 2021-01-06 – 2021-01-12 (×18): 1
  Filled 2021-01-06 (×21): qty 1

## 2021-01-06 MED ORDER — TRACE MINERALS CU-MN-SE-ZN 300-55-60-3000 MCG/ML IV SOLN
INTRAVENOUS | Status: AC
  Filled 2021-01-06: qty 800

## 2021-01-06 MED ORDER — CHLORHEXIDINE GLUCONATE 0.12 % MT SOLN
OROMUCOSAL | Status: AC
  Filled 2021-01-06: qty 15

## 2021-01-06 MED ORDER — LOPERAMIDE HCL 1 MG/7.5ML PO SUSP
2.0000 mg | ORAL | Status: DC
  Administered 2021-01-06 – 2021-01-15 (×68): 2 mg
  Filled 2021-01-06 (×77): qty 15

## 2021-01-06 NOTE — Progress Notes (Signed)
PHARMACY - TOTAL PARENTERAL NUTRITION CONSULT NOTE  Indication: Small bowel obstruction, prolonged ileus  Patient Measurements: Height: 5' 11"  (180.3 cm) Weight: 61.2 kg (134 lb 14.7 oz) IBW/kg (Calculated) : 75.3 TPN AdjBW (KG): 60.8 Body mass index is 18.82 kg/m. Usual Weight: 137 lbs  Assessment: 74 YOM presented 12/01/2020 with nausea, vomiting, abdominal pain after leaving [facility]MA at [facility] with known SBO. Patient has been on bowel rest with NG tube decompression. Patient continued to have persistent SBO on imaging with no evidence of improvement, now s/p ex-lap with LoA, SBR and placement of Gtube on 12/05/2020. Pharmacy consulted for TPN management - patient received TPN 11/19/20 through 11/29/20.  Reconsulted to resume TPN on 12/2. TF currently at 20 mL/hr given high ostomy output and concern for tube feeds not absorbing. Patient on loperamide, lomotil, tincture of opium, morphine sulfate, octreotide and fiber without improvement in output.   Glucose / Insulin: no hx DM - CBGs controlled. Utilized 9 units SSI in 24 hrs Electrolytes: Na down to 134 (on FW 200 q4), K 4.8 (goal >/= 4 for ileus), CoCa ~10.1 (none in TPN), Mag 2.5 (goal >/= 2 for ileus) Renal: SCr down to 1.02, BUN down to 96 LFTs / TGs: alk phos down 130, AST mildly elevated - down to 44; ALT / Tbili / TG WNL Prealbumin / albumin: prealbumin up to 25.6, albumin 1.7 Intake / Output; MIVF: UOP 3135 mL, ileostomy 2000 mL (on fiber, opium tincture, lomotil, loperamide, morphine sulfate, octreotide and unlikely to improve per Surgery) GI Imaging: 12/11 DG Abd - stomach gas-filled, without distention, no obvious free air 12/23 CT abd pelvis - large loculated collection in low abd/pelvis, few foci of internal gas present, new RUQ ileostomy, thickened distal SB/colon, subtotal atelectatic collapse of LLL, partial collapse of RLL, small R pleural effusion, body wall edema Surgeries / Procedures: 12/12: re-exploration and washout with new  copious drainage from incision concerning for enteric leak vs. enterotomy >> now s/p, bowel necrosis resected 12/15: ex-lap with ileostomy with fistula creation, abdominal closure 12/24 pelvic abscess drain (removed 1/13) 1/19 PEG tube replaced  Central access: PICC placed 12/08/2020 TPN start date: 11/19/20>>11/29/20; restart 12/03/21  Nutritional Goals (updated per RD rec 12/30): 2300-2700 kCal, 120-135g protein, >2L fluid per day  Current Nutrition:  TPN (increased 75 >> 100 ml/hr on 1/17 to provide more free water) Vital AF 1.2 at 20 ml/hr (not advancing d/t high ileostomy output) [employer] TID - 3 charted given  Plan:  Continue TPN at goal rate of 100 ml/hr at 1800, providing a weekly average of 2328 kCal and 120g AA per day TPN with 21g/L SMOFlipid MWF only due to shortage  Electrolytes in TPN: increase Na 25 mEq/L remove K 1/24, Ca removed 1/16, reduce Mag to 60mq/L, Phos increased to 530ml/L 1/19, max acetate Continue standard MVI and trace elements + folic acid to TPN Continue moderate SSI Q4H and adjust as needed Continue FW 20010mT Q4H per MD Monitor standard TPN labs on Mon/Thurs - will check again 1/25 AM F/u GOCSavonascussions  MicBarth KirksharmD, BCPS, BCCCP Clinical Pharmacist [phone] check AMION for all [facility] [facility]  01/06/2021 8:48 AM

## 2021-01-06 NOTE — Progress Notes (Signed)
NAME:  Ronald Wolf, MRN:  [id number], DOB:  24-Mar-1946, LOS: 89 ADMISSION DATE:  12/10/2020, CONSULTATION DATE:  12/7 REFERRING MD:  Cyndia Skeeters, CHIEF COMPLAINT:  Abdominal pain   Brief History:  Ronald Wolf is a 75 yo M w/ PMH of CVA, Seizures, Dementia, Bladder ca s/p urostomy, tobacco use, right glottic carcinoma s/p excision presenting with sbo. Found to have ischemic bowel requiring ex-lap 12/7.  Course complicated by septic shock, acute respiratory failure and AKI return to the OR 12/12 for necrotic small bowel resection of additional 70 cm inclusive of prior anastomosis 12/15 abdominal closure with ileostomy He has had a prolonged ICU stay with ventilator dependence, had a tracheostomy on 12/29 Remains in ICU for ventilator dependence and arrhythmias  Past Medical History:  CVA, Dementia, Bladder CA s/p nephrostomy, tobacco use, right glottic carcinoma s/p excision, seizures.  Significant Hospital Events:  11/30/2020 Admission 12/12/2020 OR 11/16/2020 Admit to ICU 12/10 Brief SVT with associated hypertension. Self limited Given fentanyl, metop and hydral for HTN 12/11 Good urine output with Lasix , failed extubation, reintubated within 2 hours due to inability to clear secretions 12/12 return OR for enteric leak-- found to have bowel necrosis. Left in discontinuity  12/13 self extubated, reintubated. On pressors. Family meeting with PCCM and CCS to discuss possible next steps.  12/15-was back in OR, abdominal closure, RUQ mucus fistula and L ileostomy  12/19: No significant overnight events, high ileostomy output 12/21: scheduled for family meeting. Worse hypernatremia, continued high ileostomy output.  Attempted PSV/CPAP wean, pulled volumes of high 100s -low/mid 200s with tachypnea. Placed back of full support.  12/22 ongoing high output GI/ ileostomy, TPN started; weaned 12/5 all day; off fentanyl gtt, remains on precedex  12/24 IR drain , 1 unit PRBC transfusion for hemoglobin 6.5 12/26 added  vancomycin for GPC in blood cultures and RIC suggesting MRSA 12/29 tracheostomy 12/31 Palliative discussion > family understanding that his situation is critical and he has not made much improvement  1/6 ongoing palliative conversations. No progress from pt standpoint. 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  1/11 seen by surgery, IR consulted to remove percutaneous drain.  Added octreotide in effort to try to decrease output 1/12: More fatigued back on vent 1/14: Opens eyes to voice and grimaces to pain, ?tracking. Severely deconditioned and suspect long recovery requiring vent dependence at this point. Reconsult PT for developing contractures 1/15: Tolerated trach collar 1/16: Tolerated trach collar, improved alertness 1/17: Overnight had bradycardia. This morning 3 episodes of asystole <30-60s reported per nursing   Consults:  General Surgery PCCM Palliative Care  Procedures:  11/21/2020 Ex-lap, G-tube placement 12/7 PICC >> ETT 12/7 >> 12/11, >> 12/15- back to OR for abdominal closure, exploratory laparotomy  12/24 CT-guided right pelvic abscess drain 12/29 trach  Significant Diagnostic Tests:  CT abd/pelvis 12/2 > High-grade distal small bowel obstruction, possibly due to adhesion. Mild ascites and diffuse mesenteric edema. No evidence of focal inflammatory process or abscess. Small bilateral pleural effusions and mild right lower lobe Atelectasis. CT abdomen/pelvis 12/23 Interval development of a large loculated collection in the low anterior abdomen and pelvis measuring approximately 6.1 x 11.3 x12.1 cm in size subjacent  to the operative site. Few foci of internal  gas are present. , no frank spillage of enteric contrast  CT A/P 1/9 Success drainage of fluid of pelvic fluid collection, bilateral pleural effusions  CT H 1/6 > no acute intracranial abnormality. Chronic L MCA changes from prior L MCA infarct. Atrophic and chronic white matter ischemic changes.  Stable L basal ganglia infarcts  CT abd pelvis 1/10: pelvis abscess drained. BLL effusions w/ atx. Expected post op changes  Micro Data:  12/10/2020 COVID/Flu negative 12/7 BA: Few GNR rare GPC -- predominantly PMN  12/7 BCx> Neg 12/11 respiratory -no organism in tracheal aspirate 12/24 BC >> RIC showing both staph epidermis 1/4 12/24 abscess >> E. Faecalis, B fragilis (B lactamase) 12/27 trach aspirate>> normal flora 1/8  Trach Achromobacter xylosoxidans and E.coli 1/8 BCX> Neg  Antimicrobials:  Fluc 12/8>> 12/10 Zosyn 12/8> 12/12 cefotetain 12/15>> off Zosyn 12/23 >> 12/30 eraxis 12/24 >> 1/2 Vancomycin 12/26 >>12/31 Unasyn 12/31 >1/9 zosyn 1/9>> vanc 1/9 >>off  Interim History / Subjective:  Seems comfortable on PSV and dilaudid gtt. Continues to have high output from ileostomy.  Objective   Blood pressure 132/67, pulse 78, temperature 98.6 F (37 C), temperature source Oral, resp. rate 16, height 5\' 11"  (1.803 m), weight 67 kg, SpO2 100 %.    Vent Mode: PRVC FiO2 (%):  [40 %] 40 % Set Rate:  [14 bmp] 14 bmp Vt Set:  [600 mL] 600 mL PEEP:  [5 cmH20] 5 cmH20 Pressure Support:  [20 cmH20] 20 cmH20 Plateau Pressure:  [20 cmH20-25 cmH20] 23 cmH20   Intake/Output Summary (Last 24 hours) at 01/06/2021 0816 Last data filed at 01/06/2021 0700 Gross per 24 hour  Intake 2221.24 ml  Output 4560 ml  Net -2338.76 ml   Filed Weights   01/04/21 0500 01/05/21 0500 01/06/21 0500  Weight: 67 kg 65.9 kg 67 kg   Physical Exam: General: Frail elderly appearing male, resting in bed, in NAD. Neuro: Sedated but opens eyes to voice and follows basic commands. HEENT: [place]/AT. Sclerae anicteric.  Trach C/D/I. Cardiovascular: RRR, no M/R/G.  Lungs: Respirations even and unlabored.  CTA bilaterally, No W/R/R. Abdomen: Ileostomy noted.  PEG C/D/I.  BS x 4, soft, NT/ND.  Musculoskeletal: No gross deformities, no edema.  Skin: Intact, warm, no rashes.   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 Acute on chronic hypoxemic respiratory failure Trach/Vent dependence  Bilateral pleural effusions due to malnutrition  Trach bleeding due to suction trauma High risk for needing continuous mechanical ventilation due to deconditioning in setting of severe protein calorie malnutrition. Replaced on vent on 1/17 Acute encephalopathy, multifactorial, critical illness, uremia.  Prior L MCA CVA Severe deconditioning Contractures Hypertension, uncontrolled Tachybradycardia syndrome Atrial fibrillation with RVR Seizures well controlled Bladder Cancer Also has history of penis prosthetic Given his severe anasarca urology did evaluate the patient on 1/12 to ensure prosthetic did not appear infected they felt no evidence of infection Anemia - stable    Plan: - Continue PSV, too weak for TC - Try to keep up with ileostomy output, all medical measures to date have failed (fiber, imodium, lomotil, tincture of opium) - Continue enteral  dilaudid for now but doubt he is absorbing so consider d/c  - TPN continued - Patient is DNR but all other measures to be pursued - Continue family discussions as able, palliative assistance greatly appreciated - Getting close to medical futility but do not technically meet definition at  present   Best practice:  Diet: TPN Pain/Anxiety/Delirium protocol (if indicated): dilaudid gtt VAP protocol (if indicated): in place DVT prophylaxis: heparin GI prophylaxis: ppi Glucose control: ssi Mobility: br Code Status: vent okay, no compressions/shocks Family Communication: pending Disposition: ICU pending vent liberation   Goals of Care:  Daily with palliative care, appreciate help   CC time: 30 min.   Montey Hora, [facility] For pager details, please see AMION 01/06/2021, 8:27 AM

## 2021-01-06 NOTE — Progress Notes (Addendum)
Nutrition Follow-up  DOCUMENTATION CODES:   Severe malnutrition in context of chronic illness  INTERVENTION:   TPN at goal providing 100% of nutrition needs  Tube feeding via G-tube: Vital 1.2 at 20 ml/hr 45 ml ProSource TF TID  Continue enteral feedings to promote gut adaptation. TPN helping to meet needs during this time.   200 ml free water every 4 hours  1 packet Nutrisource fiber TID - provides 9 grams soluble fiber per day  NUTRITION DIAGNOSIS:   Severe Malnutrition related to chronic illness as evidenced by severe fat depletion,severe muscle depletion.  Ongoing  GOAL:   Patient will meet greater than or equal to 90% of their needs   Meeting with nutrition support   MONITOR:   I & O's  REASON FOR ASSESSMENT:   Consult Enteral/tube feeding initiation and management  ASSESSMENT:   75 year old male with history of cognitive impairment, CVA, HTN, bladder cancer now with urostomy, right-sided glottic carcinoma s/p excision brought to ED with nausea, vomiting and abdominal pain.  Work-up in ED revealed small bowel obstruction.  He also had AKI and lactic acidosis.  Started on bowel rest with NG tube decompression and IV fluid.  General surgery following.  Subsequent x-rays with persistent SBO.   12/7 s/p ex lap, extensive LOA, SBR, g-tube placement; s/p bronch  12/10 TPN advanced to goal rate - provides: 2120 kcal and 121 grams protein  12/12 back to OR for re-exploration and washout with new copious drainage from incision; pt s/p reopening of recent laparotomy, SBR without reanastomosis, abd washout, placement of VAC 12/15 s/p ex lap, ileostomy, mucous fistula, and closure 12/18 TPN d/c'ed; TF at goal rate 12/22 TPN initiated to support patient due to short bowel  12/23 opium added  12/24 rt pelvic abscess drain placed 12/26- TF re-started (trickle) 12/29 s/p trach  12/30 TF increased to 30 ml 1/10 TF decreased to 20 ml 1/19 existing G-tube replaced per  IR  Remains on vent via trach. Currently on pressure support trial. Receiving Vital AF @ 20 ml/hr. G-tube does not show signs of leaking. Continues with high output from ileostomy despite multiple medications. Nutrisource Fiber looks to have been d/c, will add it back.   TPN running at goal rate of 100 ml/hr to provide a daily average of 2328 kcal and 120 g protein. Continue to meet 100% of needs with TPN as it is unclear how much tube feeding is being absorbed.   GOC are ongoing. Family wishes to continue all medical interventions at this time.   Admission weight: 60.8 kg  Current weight: 67 kg   UOP: 3135 ml x 24 hrs  Ileostomy: 2000 ml x 24 hrs   Medications: lomotil, SS novolog, imodium, opium Labs: Na 134 (L) CBG 147-194  Diet Order:   Diet Order            Diet NPO time specified  Diet effective midnight                 EDUCATION NEEDS:   No education needs have been identified at this time  Skin:  Skin Assessment: Skin Integrity Issues: Skin Integrity Issues:: DTI DTI: bilateral heels Stage II: coccyx, medial neck Incisions: closed abdomen  Height:   Ht Readings from Last 1 Encounters:  11/22/20 5\' 11"  (1.803 m)    Weight:   Wt Readings from Last 1 Encounters:  01/06/21 67 kg    Ideal Body Weight:  78.2 kg  BMI:  Body mass index is  20.6 kg/m.  Estimated Nutritional Needs:   Kcal:  2094-7096  Protein:  120-135 grams  Fluid:  > 2 L  Mariana Single RD, LDN Clinical Nutrition Pager listed in [place]

## 2021-01-07 DIAGNOSIS — J9601 Acute respiratory failure with hypoxia: Secondary | ICD-10-CM | POA: Diagnosis not present

## 2021-01-07 DIAGNOSIS — K651 Peritoneal abscess: Secondary | ICD-10-CM | POA: Diagnosis not present

## 2021-01-07 DIAGNOSIS — Z930 Tracheostomy status: Secondary | ICD-10-CM | POA: Diagnosis not present

## 2021-01-07 DIAGNOSIS — R100 Acute abdomen: Secondary | ICD-10-CM

## 2021-01-07 DIAGNOSIS — K56609 Unspecified intestinal obstruction, unspecified as to partial versus complete obstruction: Secondary | ICD-10-CM | POA: Diagnosis not present

## 2021-01-07 LAB — CBC
HCT: 35.2 % — ABNORMAL LOW (ref 39.0–52.0)
Hemoglobin: 10.7 g/dL — ABNORMAL LOW (ref 13.0–17.0)
MCH: 30.5 pg (ref 26.0–34.0)
MCHC: 30.4 g/dL (ref 30.0–36.0)
MCV: 100.3 fL — ABNORMAL HIGH (ref 80.0–100.0)
Platelets: 299 10*3/uL (ref 150–400)
RBC: 3.51 MIL/uL — ABNORMAL LOW (ref 4.22–5.81)
RDW: 20.4 % — ABNORMAL HIGH (ref 11.5–15.5)
WBC: 9 10*3/uL (ref 4.0–10.5)
nRBC: 0.3 % — ABNORMAL HIGH (ref 0.0–0.2)

## 2021-01-07 LAB — BASIC METABOLIC PANEL
Anion gap: 8 (ref 5–15)
BUN: 82 mg/dL — ABNORMAL HIGH (ref 8–23)
CO2: 23 mmol/L (ref 22–32)
Calcium: 8.4 mg/dL — ABNORMAL LOW (ref 8.9–10.3)
Chloride: 106 mmol/L (ref 98–111)
Creatinine, Ser: 0.93 mg/dL (ref 0.61–1.24)
GFR, Estimated: >60 mL/min (ref >60)
Glucose, Bld: 142 mg/dL — ABNORMAL HIGH (ref 70–99)
Potassium: 4.2 mmol/L (ref 3.5–5.1)
Sodium: 137 mmol/L (ref 135–145)

## 2021-01-07 LAB — GLUCOSE, CAPILLARY
Glucose-Capillary: 136 mg/dL — ABNORMAL HIGH (ref 70–99)
Glucose-Capillary: 144 mg/dL — ABNORMAL HIGH (ref 70–99)
Glucose-Capillary: 149 mg/dL — ABNORMAL HIGH (ref 70–99)
Glucose-Capillary: 149 mg/dL — ABNORMAL HIGH (ref 70–99)
Glucose-Capillary: 159 mg/dL — ABNORMAL HIGH (ref 70–99)
Glucose-Capillary: 189 mg/dL — ABNORMAL HIGH (ref 70–99)

## 2021-01-07 MED ORDER — TRACE MINERALS CU-MN-SE-ZN 300-55-60-3000 MCG/ML IV SOLN
INTRAVENOUS | Status: AC
  Filled 2021-01-07: qty 800

## 2021-01-07 NOTE — Progress Notes (Signed)
NAME:  Ronald Wolf, MRN:  [id number], DOB:  1946/04/24, LOS: 74 ADMISSION DATE:  11/24/2020, CONSULTATION DATE:  12/7 REFERRING MD:  Cyndia Skeeters, CHIEF COMPLAINT:  Abdominal pain   Brief History:  Ronald Wolf is a 75 yo M w/ PMH of CVA, Seizures, Dementia, Bladder ca s/p urostomy, tobacco use, right glottic carcinoma s/p excision presenting with sbo. Found to have ischemic bowel requiring ex-lap 12/7.  Course complicated by septic shock, acute respiratory failure and AKI return to the OR 12/12 for necrotic small bowel resection of additional 70 cm inclusive of prior anastomosis 12/15 abdominal closure with ileostomy He has had a prolonged ICU stay with ventilator dependence, had a tracheostomy on 12/29 Remains in ICU for ventilator dependence and arrhythmias  Past Medical History:  CVA, Dementia, Bladder CA s/p nephrostomy, tobacco use, right glottic carcinoma s/p excision, seizures.  Significant Hospital Events:  11/25/2020 Admission 11/16/2020 OR 11/23/2020 Admit to ICU 12/10 Brief SVT with associated hypertension. Self limited Given fentanyl, metop and hydral for HTN 12/11 Good urine output with Lasix , failed extubation, reintubated within 2 hours due to inability to clear secretions 12/12 return OR for enteric leak-- found to have bowel necrosis. Left in discontinuity  12/13 self extubated, reintubated. On pressors. Family meeting with PCCM and CCS to discuss possible next steps.  12/15-was back in OR, abdominal closure, RUQ mucus fistula and L ileostomy  12/19: No significant overnight events, high ileostomy output 12/21: scheduled for family meeting. Worse hypernatremia, continued high ileostomy output.  Attempted PSV/CPAP wean, pulled volumes of high 100s -low/mid 200s with tachypnea. Placed back of full support.  12/22 ongoing high output GI/ ileostomy, TPN started; weaned 12/5 all day; off fentanyl gtt, remains on precedex  12/24 IR drain , 1 unit PRBC transfusion for hemoglobin 6.5 12/26 added  vancomycin for GPC in blood cultures and RIC suggesting MRSA 12/29 tracheostomy 12/31 Palliative discussion > family understanding that his situation is critical and he has not made much improvement  1/6 ongoing palliative conversations. No progress from pt standpoint. 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  1/11 seen by surgery, IR consulted to remove percutaneous drain.  Added octreotide in effort to try to decrease output 1/12: More fatigued back on vent 1/14: Opens eyes to voice and grimaces to pain, ?tracking. Severely deconditioned and suspect long recovery requiring vent dependence at this point. Reconsult PT for developing contractures 1/15: Tolerated trach collar 1/16: Tolerated trach collar, improved alertness 1/17: Overnight had bradycardia. This morning 3 episodes of asystole <30-60s reported per nursing   Consults:  General Surgery PCCM Palliative Care  Procedures:  12/10/2020 Ex-lap, G-tube placement 12/7 PICC >> ETT 12/7 >> 12/11, >> 12/15- back to OR for abdominal closure, exploratory laparotomy  12/24 CT-guided right pelvic abscess drain 12/29 trach  Significant Diagnostic Tests:  CT abd/pelvis 12/2 > High-grade distal small bowel obstruction, possibly due to adhesion. Mild ascites and diffuse mesenteric edema. No evidence of focal inflammatory process or abscess. Small bilateral pleural effusions and mild right lower lobe Atelectasis. CT abdomen/pelvis 12/23 Interval development of a large loculated collection in the low anterior abdomen and pelvis measuring approximately 6.1 x 11.3 x12.1 cm in size subjacent  to the operative site. Few foci of internal  gas are present. , no frank spillage of enteric contrast  CT A/P 1/9 Success drainage of fluid of pelvic fluid collection, bilateral pleural effusions  CT H 1/6 > no acute intracranial abnormality. Chronic L MCA changes from prior L MCA infarct. Atrophic and chronic white matter ischemic changes.  Stable L basal ganglia infarcts  CT abd pelvis 1/10: pelvis abscess drained. BLL effusions w/ atx. Expected post op changes    Micro Data:  11/16/2020 COVID/Flu negative 12/7 BA: Few GNR rare GPC -- predominantly PMN  12/7 BCx> Neg 12/11 respiratory -no organism in tracheal aspirate 12/24 BC >> RIC showing both staph epidermis 1/4 12/24 abscess >> E. Faecalis, B fragilis (B lactamase) 12/27 trach aspirate>> normal flora 1/8  Trach Achromobacter xylosoxidans and E.coli 1/8 BCX> Neg  Antimicrobials:  Fluc 12/8>> 12/10 Zosyn 12/8> 12/12 cefotetain 12/15>> off Zosyn 12/23 >> 12/30 eraxis 12/24 >> 1/2 Vancomycin 12/26 >>12/31 Unasyn 12/31 >1/9 zosyn 1/9>> vanc 1/9 >>off  Interim History / Subjective:    Objective   Blood pressure 132/67, pulse (!) 106, temperature 99.5 F (37.5 C), temperature source Axillary, resp. rate 19, height 5\' 11"  (1.803 m), weight 67.3 kg, SpO2 96 %.    Vent Mode: PRVC FiO2 (%):  [40 %-60 %] 40 % Set Rate:  [14 bmp-15 bmp] 15 bmp Vt Set:  [600 mL] 600 mL PEEP:  [5 cmH20] 5 cmH20 Pressure Support:  [12 cmH20] 12 cmH20 Plateau Pressure:  [7 cmH20-24 cmH20] 24 cmH20   Intake/Output Summary (Last 24 hours) at 01/07/2021 1045 Last data filed at 01/07/2021 1000 Gross per 24 hour  Intake 3061.04 ml  Output 5510 ml  Net -2448.96 ml   Filed Weights   01/05/21 0500 01/06/21 0500 01/07/21 0500  Weight: 65.9 kg 67 kg 67.3 kg   General:  Critically ill-appearing M, though in no acute distress HEENT: MM pink/moist, trach in place, vent dependent Neuro: opens eyes, not otherwise following commands or interactive  CV: s1s2 rrr, no m/r/g PULM:  Mechanical breath  sounds bilaterally GI: soft, mildly distended Extremities: warm/dry, no edema  Skin: no rashes or lesions    Resolved Hospital Problem list   HCAP  Ecoli and achromobacter xylosoxidans s/p >5d Zosyn  Assessment & Plan:   Ischemic bowel perf s/p ex-lap x 2 for resection S/p mucus fistula ileostomy, G tube placement  Complicated by intra-abdominal abscess s/p drain removal 1/13 Severe protein calorie malnutrition High ileostomy output (>1200cc/dail) 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 Acute on chronic hypoxemic respiratory failure Trach/Vent dependence  Bilateral pleural effusions due to malnutrition  Trach bleeding due to suction trauma High risk for needing continuous mechanical ventilation due to deconditioning in setting of severe protein calorie malnutrition. Replaced on vent on 1/17 Acute encephalopathy, multifactorial, critical illness, uremia.  Prior L MCA CVA Severe deconditioning Contractures Hypertension, uncontrolled Tachybradycardia syndrome Atrial fibrillation with RVR Seizures well controlled Bladder Cancer Also has history of penis prosthetic Given his severe anasarca urology did evaluate the patient on 1/12 to ensure prosthetic did not appear infected they felt no evidence of infection Anemia - stable    Plan: -overall stable, but not progressing, continued high ileostomy output ~1500, not improved with fiber, imodium, lomotil, fiber, tincture of opium -Continue TPN -continue Octreotide -Continue Keppra -on Dilaudid IV and PO for pain control  -overall no significant medical progress, he is DNR, palliative consulted -Will try to reach family again today - Getting  close to medical futility but do not technically meet definition at present --Maintain full vent support with SAT/SBT as tolerated -titrate Vent setting to maintain SpO2 greater than or equal to 90%. -HOB elevated 30 degrees. -Plateau pressures less  than 30 cm H20.  -Follow chest x-ray, ABG prn.   -Bronchial hygiene and RT/bronchodilator protocol.    Best practice:  Diet: TPN Pain/Anxiety/Delirium protocol (if indicated): dilaudid gtt VAP protocol (if indicated): in place DVT prophylaxis: heparin GI prophylaxis: ppi Glucose control: ssi Mobility: br Code Status: vent okay, no compressions/shocks Family Communication: pending Disposition: ICU pending vent liberation   Goals of Care:  Daily with palliative care, appreciate help   CRITICAL CARE Performed by: Otilio Carpen Kiante Ciavarella   Total critical care time: 33 minutes  Critical care time was exclusive of separately billable procedures and treating other patients.  Critical care was necessary to treat or prevent imminent or life-threatening deterioration.  Critical care was time spent personally by me on the following activities: development of treatment plan with patient and/or surrogate as well as nursing, discussions with consultants, evaluation of patient's response to treatment, examination of patient, obtaining history from patient or surrogate, ordering and performing treatments and interventions, ordering and review of laboratory studies, ordering and review of radiographic studies, pulse oximetry and re-evaluation of patient's condition.  Otilio Carpen Laria Grimmett, PA-C [facility]  Pager# [phone], if no answer [phone]

## 2021-01-07 NOTE — Progress Notes (Signed)
PHARMACY - TOTAL PARENTERAL NUTRITION CONSULT NOTE  Indication: Small bowel obstruction, prolonged ileus  Patient Measurements: Height: _0  (180.3 cm) Weight: 61.2 kg (134 lb 14.7 oz) IBW/kg (Calculated) : 75.3 TPN AdjBW (KG): 60.8 Body mass index is 18.82 kg/m. Usual Weight: 137 lbs  Assessment: 74 YOM presented 11/28/2020 with nausea, vomiting, abdominal pain after leaving AMA at [facility] with known SBO. Patient has been on bowel rest with NG tube decompression. Patient continued to have persistent SBO on imaging with no evidence of improvement, now s/p ex-lap with LoA, SBR and placement of Gtube on 12/08/2020. Pharmacy consulted for TPN management - patient received TPN 11/19/20 through 11/29/20.  Reconsulted to resume TPN on 12/2. TF currently at 20 mL/hr given high ostomy output and concern for tube feeds not absorbing. Patient on loperamide, lomotil, tincture of opium, morphine sulfate, octreotide and fiber without improvement in output.   Glucose / Insulin: no hx DM - CBGs controlled. Utilized 12 units SSI in 24 hrs Electrolytes: Na 137 (on FW 200 q4), K 4.2 (goal >/= 4 for ileus), CoCa ~10.1 (none in TPN), Mag 2.5 (goal >/= 2 for ileus) Renal: SCr down to 1.02, BUN down to 96 LFTs / TGs: alk phos down 130, AST mildly elevated - down to 44; ALT / Tbili / TG WNL Prealbumin / albumin: prealbumin up to 25.6, albumin 1.7 Intake / Output; MIVF: UOP 4050 mL, ileostomy 1500 mL (on fiber, opium tincture, lomotil, loperamide, morphine sulfate, octreotide and unlikely to improve per Surgery) GI Imaging: 12/11 DG Abd - stomach gas-filled, without distention, no obvious free air 12/23 CT abd pelvis - large loculated collection in low abd/pelvis, few foci of internal gas present, new RUQ ileostomy, thickened distal SB/colon, subtotal atelectatic collapse of LLL, partial collapse of RLL, small R pleural effusion, body wall edema Surgeries / Procedures: 12/12: re-exploration and washout with new copious  drainage from incision concerning for enteric leak vs. enterotomy >> now s/p, bowel necrosis resected 12/15: ex-lap with ileostomy with fistula creation, abdominal closure 12/24 pelvic abscess drain (removed 1/13) 1/19 PEG tube replaced  Central access: PICC placed 12/12/2020 TPN start date: 11/19/20>>11/29/20; restart 12/03/21  Nutritional Goals (updated per RD rec 12/30): 2300-2700 kCal, 120-135g protein, >2L fluid per day  Current Nutrition:  TPN (increased 75 >> 100 ml/hr on 1/17 to provide more free water) Vital AF 1.2 at 20 ml/hr (not advancing d/t high ileostomy output) Prosource TID - 3 charted given  Plan:  Continue TPN at goal rate of 100 ml/hr at 1800, providing a weekly average of 2328 kCal and 120g AA per day TPN with 21g/L SMOFlipid MWF only due to shortage  Electrolytes in TPN: increase Na 25 mEq/L remove K 1/24, Ca removed 1/16, reduce Mag to 71mq/L, Phos increased to 584ml/L 1/19, max acetate - no changes today 1/26 Continue standard MVI and trace elements + folic acid to TPN Continue moderate SSI Q4H and adjust as needed Continue FW 20034mT Q4H per MD Monitor standard TPN labs on Mon/Thurs - will check again 1/25 AM F/u GOC discussions  MicBarth KirksharmD, BCPS, BCCCP Clinical Pharmacist [phone] check AMION for all [facility] [place]  01/07/2021 7:44 AM

## 2021-01-07 NOTE — Progress Notes (Signed)
Physical Therapy Treatment Patient Details Name: Ronald Wolf MRN: [id number] DOB: September 03, 1946 Today's Date: 01/07/2021    History of Present Illness Pt is a 75 y.o. male admitted 12/03/2020 with abdominal pain and nausea; workup revealed SBO, s/p NGT placement. Also with AKI. Pt is now s/p multiple ex lap with revisions and placement of G-tube. Pt self-extubated 3 times, is now s/p placement of trach on 12/29. PMH includes stroke, HTN, bladder CA s/p urostomy, seizure, Hep C, anxiety, depression.    PT Comments    Pt continues to open eyes to name only. Pt with no active participation or interaction with PT today. Pt resting in flexed UEs and extensor/adduction in bilat LEs. Pt with noted attempt to use L UE to reach towards face. Pt continues to be dependent for all mobility. Unsure of medical plan and d/c possibilities as family not willing to have a meeting about patients care. Pt unsafe to return home unless 24/7 assist is available. Acute PT to cont to follow.    Follow Up Recommendations  SNF;Supervision/Assistance - 24 hour     Equipment Recommendations  Hospital bed (if going home with hospice)    Recommendations for Other Services       Precautions / Restrictions Precautions Precautions: Fall;Other (comment) Precaution Comments: trach, PEG, urostomy, colostomy, gastrostomy, bil prevalon boots Required Braces or Orthoses: Other Brace Other Brace: bilateral prevalon boots Restrictions Weight Bearing Restrictions: No    Mobility  Bed Mobility Overal bed mobility: Needs Assistance             General bed mobility comments: used egress position in bed to assist pt to upright sitting position, pt became diaphoretic, HR increased from 80s to 112bpm, BP stable. Pt returned to [facility] to 30 deg, pt dependent for all bed mobility at this time with no active participation  Transfers                 General transfer comment: unable at this time  Ambulation/Gait              General Gait Details: unable at this time   Stairs             Wheelchair Mobility    Modified Rankin (Stroke Patients Only)       Balance Overall balance assessment: Needs assistance   Sitting balance-Leahy Scale: Zero Sitting balance - Comments: reliant on bed egress to achieve sitting/upright position                                    Cognition Arousal/Alertness: Lethargic Behavior During Therapy: Flat affect Overall Cognitive Status: Difficult to assess                                 General Comments: pt with no command follow, does open eyes to name, attempted to say something but unable to make out what he was saying, pt frequenctly closing eyes, unable to maintain open      Exercises Other Exercises Other Exercises: PROM to BIL UE at shoulder flex/abd, elbow flextion, wrist flex/ext and open/closing of hands. pt initially resting in flexed position, with increased time pt able to extend bilat UEs, and open fingers, pt with limited shld flex/abd Other Exercises: bilat PROM to hip, knees, and ankle. pt very rigid holding in more extension and adduction, grimiacing with movement,  can get ankles to neutral, increased hip/knee ROM on L compared to R however both minimal due to increased tone/resistance    General Comments General comments (skin integrity, edema, etc.): vent settings:40% on 5 of Peep, SpO2 >93%      Pertinent Vitals/Pain Pain Assessment: Faces Faces Pain Scale: Hurts little more Pain Location: movement, eye opening, reaching with L UE with PROM to bilat UE and LEs Pain Descriptors / Indicators: Grimacing Pain Intervention(s): Limited activity within patient's tolerance    Home Living                      Prior Function            PT Goals (current goals can now be found in the care plan section) Acute Rehab PT Goals Patient Stated Goal: not able to state Time For Goal Achievement:  01/21/21 Potential to Achieve Goals: Poor Progress towards PT goals: Not progressing toward goals - comment    Frequency    Min 1X/week      PT Plan Current plan remains appropriate;Frequency needs to be updated    Co-evaluation              AM-PAC PT "6 Clicks" Mobility   Outcome Measure  Help needed turning from your back to your side while in a flat bed without using bedrails?: Total Help needed moving from lying on your back to sitting on the side of a flat bed without using bedrails?: Total Help needed moving to and from a bed to a chair (including a wheelchair)?: Total Help needed standing up from a chair using your arms (e.g., wheelchair or bedside chair)?: Total Help needed to walk in hospital room?: Total Help needed climbing 3-5 steps with a railing? : Total 6 Click Score: 6    End of Session Equipment Utilized During Treatment: Oxygen Activity Tolerance: Patient limited by pain Patient left: in bed;with call bell/phone within reach;with bed alarm set Nurse Communication: Mobility status PT Visit Diagnosis: Other abnormalities of gait and mobility (R26.89);Muscle weakness (generalized) (M62.81)     Time: 6811-5726 PT Time Calculation (min) (ACUTE ONLY): 15 min  Charges:  $Therapeutic Exercise: 8-22 mins                     Kittie Plater, PT, DPT Acute Rehabilitation Services Pager #: [phone] Office #: [phone]    Berline Lopes 01/07/2021, 12:28 PM

## 2021-01-07 NOTE — Progress Notes (Signed)
Attempted to reach Pt's daughter [name], but was unable to get through at the number listed.  Otilio Carpen Jancy Sprankle, PA-C

## 2021-01-07 NOTE — Progress Notes (Signed)
RT note. Pt. Flipped back to previous settings PRVC 600(8cc)R 14+5, 60% due to desat. VSS at this time, RT will continue to monitor.

## 2021-01-08 DIAGNOSIS — Z930 Tracheostomy status: Secondary | ICD-10-CM

## 2021-01-08 DIAGNOSIS — R54 Age-related physical debility: Secondary | ICD-10-CM

## 2021-01-08 DIAGNOSIS — J9610 Chronic respiratory failure, unspecified whether with hypoxia or hypercapnia: Secondary | ICD-10-CM | POA: Diagnosis not present

## 2021-01-08 DIAGNOSIS — J9601 Acute respiratory failure with hypoxia: Secondary | ICD-10-CM | POA: Diagnosis not present

## 2021-01-08 DIAGNOSIS — Z7189 Other specified counseling: Secondary | ICD-10-CM

## 2021-01-08 DIAGNOSIS — K56609 Unspecified intestinal obstruction, unspecified as to partial versus complete obstruction: Secondary | ICD-10-CM | POA: Diagnosis not present

## 2021-01-08 DIAGNOSIS — Z515 Encounter for palliative care: Secondary | ICD-10-CM | POA: Diagnosis not present

## 2021-01-08 DIAGNOSIS — Z9911 Dependence on respirator [ventilator] status: Secondary | ICD-10-CM | POA: Diagnosis not present

## 2021-01-08 DIAGNOSIS — G9349 Other encephalopathy: Secondary | ICD-10-CM

## 2021-01-08 LAB — GLUCOSE, CAPILLARY
Glucose-Capillary: 137 mg/dL — ABNORMAL HIGH (ref 70–99)
Glucose-Capillary: 143 mg/dL — ABNORMAL HIGH (ref 70–99)
Glucose-Capillary: 144 mg/dL — ABNORMAL HIGH (ref 70–99)
Glucose-Capillary: 156 mg/dL — ABNORMAL HIGH (ref 70–99)
Glucose-Capillary: 162 mg/dL — ABNORMAL HIGH (ref 70–99)
Glucose-Capillary: 198 mg/dL — ABNORMAL HIGH (ref 70–99)

## 2021-01-08 LAB — COMPREHENSIVE METABOLIC PANEL
ALT: 44 U/L (ref 0–44)
AST: 46 U/L — ABNORMAL HIGH (ref 15–41)
Albumin: 1.8 g/dL — ABNORMAL LOW (ref 3.5–5.0)
Alkaline Phosphatase: 146 U/L — ABNORMAL HIGH (ref 38–126)
Anion gap: 7 (ref 5–15)
BUN: 77 mg/dL — ABNORMAL HIGH (ref 8–23)
CO2: 24 mmol/L (ref 22–32)
Calcium: 8.5 mg/dL — ABNORMAL LOW (ref 8.9–10.3)
Chloride: 105 mmol/L (ref 98–111)
Creatinine, Ser: 0.98 mg/dL (ref 0.61–1.24)
GFR, Estimated: >60 mL/min (ref >60)
Glucose, Bld: 146 mg/dL — ABNORMAL HIGH (ref 70–99)
Potassium: 4.2 mmol/L (ref 3.5–5.1)
Sodium: 136 mmol/L (ref 135–145)
Total Bilirubin: 0.9 mg/dL (ref 0.3–1.2)
Total Protein: 7.4 g/dL (ref 6.5–8.1)

## 2021-01-08 LAB — CBC
HCT: 26.9 % — ABNORMAL LOW (ref 39.0–52.0)
Hemoglobin: 8.4 g/dL — ABNORMAL LOW (ref 13.0–17.0)
MCH: 31.1 pg (ref 26.0–34.0)
MCHC: 31.2 g/dL (ref 30.0–36.0)
MCV: 99.6 fL (ref 80.0–100.0)
Platelets: 359 10*3/uL (ref 150–400)
RBC: 2.7 MIL/uL — ABNORMAL LOW (ref 4.22–5.81)
RDW: 20.4 % — ABNORMAL HIGH (ref 11.5–15.5)
WBC: 11.9 10*3/uL — ABNORMAL HIGH (ref 4.0–10.5)
nRBC: 0.3 % — ABNORMAL HIGH (ref 0.0–0.2)

## 2021-01-08 LAB — MAGNESIUM: Magnesium: 2 mg/dL (ref 1.7–2.4)

## 2021-01-08 LAB — PHOSPHORUS: Phosphorus: 3.3 mg/dL (ref 2.5–4.6)

## 2021-01-08 MED ORDER — TRACE MINERALS CU-MN-SE-ZN 300-55-60-3000 MCG/ML IV SOLN
INTRAVENOUS | Status: AC
  Filled 2021-01-08: qty 800

## 2021-01-08 NOTE — Progress Notes (Signed)
Additional non face to face encounter-   Received call from [place].  She inquired- "Why does everyone keep calling and asking about decisions? We said we want aggressive care?"  I discussed with her that patient is dying slowly and we are prolonging his dying process and suffering by continuing to put him on the ventilator and tube feeding and ICU level care.  I explained that medical interventions are done to either cure, or improve quality and at this point our medical interventions are doing neither for her Dad.  Our hope is that this can be expressed to family and they can also come to this understanding and be in agreement with the recommendations of the medical team to stop this aggressive life prolonging care that is not fixing or improving her Father's state, and allow for a natural dying process with comfort and dignity.   Ronald Wolf expressed that after receiving the picture of her Father that was sent to her by another provider she understands now that patient is suffering. It is not her goal to suffer.  She shares that she has discussed this with her Aunt and they are contacting family to see if anyone wishes to visit her father before they transition to comfort measures.   I encouraged Ronald Wolf to have family visit sooner than later.   Ronald Wolf, AGNP-C Palliative Medicine  Please call Palliative Medicine team phone with any questions [phone]. For individual providers please see AMION.   Total time: 37 minutes

## 2021-01-08 NOTE — Progress Notes (Signed)
PHARMACY - TOTAL PARENTERAL NUTRITION CONSULT NOTE  Indication: Small bowel obstruction, prolonged ileus  Patient Measurements: Height: 5' 11" (180.3 cm) Weight: 61.2 kg (134 lb 14.7 oz) IBW/kg (Calculated) : 75.3 TPN AdjBW (KG): 60.8 Body mass index is 18.82 kg/m. Usual Weight: 137 lbs  Assessment: 74 YOM presented 12/05/2020 with nausea, vomiting, abdominal pain after leaving AMA at [facility] with known SBO. Patient has been on bowel rest with NG tube decompression. Patient continued to have persistent SBO on imaging with no evidence of improvement, now s/p ex-lap with LoA, SBR and placement of Gtube on 11/30/2020. Pharmacy consulted for TPN management - patient received TPN 11/19/20 through 11/29/20.  Reconsulted to resume TPN on 12/2. TF currently at 20 mL/hr given high ostomy output and concern for tube feeds not absorbing. Patient on loperamide, lomotil, tincture of opium, morphine sulfate, octreotide and fiber without improvement in output.   Glucose / Insulin: no hx DM - CBGs controlled. Utilized 15 units SSI in 24 hrs Electrolytes: Na 136 (on FW 200 q4), K 4.2 (goal >/= 4 for ileus), CoCa ~10.1 (none in TPN), Mag 2 (goal >/= 2 for ileus) Renal: SCr down to 0.98, BUN down to 77 LFTs / TGs: alk phos down 130, LFTs ok Prealbumin / albumin: prealbumin up to 25.6, albumin 1.7 Intake / Output; MIVF: UOP 3470 mL, ileostomy 1130 mL (on fiber, opium tincture, lomotil, loperamide, morphine sulfate, octreotide and unlikely to improve per Surgery) GI Imaging: 12/11 DG Abd - stomach gas-filled, without distention, no obvious free air 12/23 CT abd pelvis - large loculated collection in low abd/pelvis, few foci of internal gas present, new RUQ ileostomy, thickened distal SB/colon, subtotal atelectatic collapse of LLL, partial collapse of RLL, small R pleural effusion, body wall edema Surgeries / Procedures: 12/12: re-exploration and washout with new copious drainage from incision concerning for enteric  leak vs. enterotomy >> now s/p, bowel necrosis resected 12/15: ex-lap with ileostomy with fistula creation, abdominal closure 12/24 pelvic abscess drain (removed 1/13) 1/19 PEG tube replaced  Central access: PICC placed 12/11/2020 TPN start date: 11/19/20>>11/29/20; restart 12/03/21  Nutritional Goals (updated per RD rec 12/30): 2300-2700 kCal, 120-135g protein, >2L fluid per day  Current Nutrition:  TPN (increased 75 >> 100 ml/hr on 1/17 to provide more free water) Vital AF 1.2 at 20 ml/hr (not advancing d/t high ileostomy output) Prosource TID - 3 charted given  Plan:  Continue TPN at goal rate of 100 ml/hr at 1800, providing a weekly average of 2328 kCal and 120g AA per day TPN with 21g/L SMOFlipid MWF only due to shortage  Electrolytes in TPN: increase Na 25 mEq/L remove K 1/24, Ca removed 1/16, reduce Mag to 43mq/L, Phos increased to 531ml/L 1/19, max acetate - no changes today 1/26 Continue standard MVI and trace elements + folic acid to TPN Continue moderate SSI Q4H and adjust as needed Continue FW 20060mT Q4H per MD Monitor standard TPN labs on Mon/Thurs - will check again 1/25 AM F/u GOC discussions  MicBarth KirksharmD, BCPS, BCCCP Clinical Pharmacist [phone] check AMION for all [facility] [place]  01/08/2021 8:16 AM

## 2021-01-08 NOTE — Progress Notes (Addendum)
Patient vomited for second time. E-link notified of vomiting and tube feed placed on hold. No s/s of distress noted at this time, but patient tachycardic up to 150 during vomiting. Patient has active bowel sounds x4 quadrants.

## 2021-01-08 NOTE — Progress Notes (Signed)
[facility] Progress Note  43 Days Post-Op  Subjective: CC-  No acute events over night.  Continues to require full ventilatory support most of the time. G tube functioning Ileostomy 1130cc/24hrs  Objective: Vital signs in last 24 hours: Temp:  [97.7 F (36.5 C)-99 F (37.2 C)] 99 F (37.2 C) (01/27 0750) Pulse Rate:  [93-107] 106 (01/27 1000) Resp:  [16-29] 21 (01/27 1000) BP: (105-147)/(58-81) 138/70 (01/27 1000) SpO2:  [92 %-100 %] 92 % (01/27 1000) FiO2 (%):  [40 %-60 %] 60 % (01/27 1000) Weight:  [66.7 kg] 66.7 kg (01/27 0345) Last BM Date: 01/08/21  Intake/Output from previous day: 01/26 0701 - 01/27 0700 In: 3110.6 [I.V.:2580.6; NG/GT:440] Out: 4600 [Urine:3470; Stool:1130] Intake/Output this shift: Total I/O In: 355.3 [I.V.:325.3; Other:30] Out: 800 [Urine:500; Stool:300]  PE: Gen:Ill-appearing elderly gentleman with bitemporal wasting HEENT: trach in place. On vent NUU:VOZD, ND, NT,openmidline woundwith some fibrinous exudate and some tissue necrosis but fascia intact/ sutures visible,RUQstoma(mucus fistula)viable withscanttanoutput, LLQstoma(end ileostomy)pinkwithbilious appearing output, urostomy productive, g-tubesite cdi Neuro:opens eyes briefly but does not respond orfollow commands  Skin: warm and dry   Lab Results:  Recent Labs    01/07/21 0502 01/08/21 0350  WBC 9.0 11.9*  HGB 10.7* 8.4*  HCT 35.2* 26.9*  PLT 299 359   BMET Recent Labs    01/07/21 0502 01/08/21 0350  NA 137 136  K 4.2 4.2  CL 106 105  CO2 23 24  GLUCOSE 142* 146*  BUN 82* 77*  CREATININE 0.93 0.98  CALCIUM 8.4* 8.5*   PT/INR No results for input(s): LABPROT, INR in the last 72 hours. CMP     Component Value Date/Time   NA 136 01/08/2021 0350   NA 145 03/08/2016 1356   K 4.2 01/08/2021 0350   K 4.5 03/08/2016 1356   CL 105 01/08/2021 0350   CO2 24 01/08/2021 0350   CO2 28 03/08/2016 1356   GLUCOSE 146 (H) 01/08/2021 0350    GLUCOSE 98 03/08/2016 1356   BUN 77 (H) 01/08/2021 0350   BUN 14.2 03/08/2016 1356   CREATININE 0.98 01/08/2021 0350   CREATININE 0.9 03/08/2016 1356   CALCIUM 8.5 (L) 01/08/2021 0350   CALCIUM 9.8 03/08/2016 1356   PROT 7.4 01/08/2021 0350   PROT 8.2 03/08/2016 1356   ALBUMIN 1.8 (L) 01/08/2021 0350   ALBUMIN 3.6 03/08/2016 1356   AST 46 (H) 01/08/2021 0350   AST 42 (H) 03/08/2016 1356   ALT 44 01/08/2021 0350   ALT 43 03/08/2016 1356   ALKPHOS 146 (H) 01/08/2021 0350   ALKPHOS 79 03/08/2016 1356   BILITOT 0.9 01/08/2021 0350   BILITOT 0.74 03/08/2016 1356   GFRNONAA >60 01/08/2021 0350   GFRAA >60 12/31/2019 0221   Lipase     Component Value Date/Time   LIPASE 57 (H) 12/03/2020 1830    Vent Mode: PRVC FiO2 (%):  [40 %-60 %] 60 % Set Rate:  [14 bmp] 14 bmp Vt Set:  [600 mL] 600 mL PEEP:  [5 cmH20] 5 cmH20 Pressure Support:  [8 cmH20] 8 cmH20    Studies/Results: No results found.  Anti-infectives: Anti-infectives (From admission, onward)   Start     Dose/Rate Route Frequency Ordered Stop   12/21/20 1130  vancomycin (VANCOREADY) IVPB 1250 mg/250 mL  Status:  Discontinued        1,250 mg 166.7 mL/hr over 90 Minutes Intravenous Every 48 hours 12/21/20 1032 12/22/20 0848   12/21/20 1115  piperacillin-tazobactam (ZOSYN) IVPB 3.375 g  3.375 g 12.5 mL/hr over 240 Minutes Intravenous Every 8 hours 12/21/20 1022 12/31/20 0030   12/12/20 1800  Ampicillin-Sulbactam (UNASYN) 3 g in sodium chloride 0.9 % 100 mL IVPB  Status:  Discontinued        3 g 200 mL/hr over 30 Minutes Intravenous Every 6 hours 12/12/20 1101 12/21/20 1022   12/08/20 1300  vancomycin (VANCOCIN) IVPB 1000 mg/200 mL premix  Status:  Discontinued        1,000 mg 200 mL/hr over 60 Minutes Intravenous Every 24 hours 12/08/20 0845 12/12/20 1059   12/08/20 0100  vancomycin (VANCOREADY) IVPB 500 mg/100 mL  Status:  Discontinued        500 mg 100 mL/hr over 60 Minutes Intravenous Every 12 hours 12/07/20  1130 12/07/20 1337   12/07/20 1215  vancomycin (VANCOREADY) IVPB 1250 mg/250 mL        1,250 mg 166.7 mL/hr over 90 Minutes Intravenous  Once 12/07/20 1124 12/07/20 1336   12/06/20 1030  anidulafungin (ERAXIS) 100 mg in sodium chloride 0.9 % 100 mL IVPB  Status:  Discontinued       "Followed by" Linked Group Details   100 mg 78 mL/hr over 100 Minutes Intravenous Every 24 hours 12/05/20 0939 12/13/20 1230   12/05/20 1030  anidulafungin (ERAXIS) 200 mg in sodium chloride 0.9 % 200 mL IVPB       "Followed by" Linked Group Details   200 mg 78 mL/hr over 200 Minutes Intravenous  Once 12/05/20 0939 12/05/20 1618   12/04/20 1500  piperacillin-tazobactam (ZOSYN) IVPB 3.375 g  Status:  Discontinued        3.375 g 12.5 mL/hr over 240 Minutes Intravenous Every 8 hours 12/04/20 0818 12/12/20 1059   12/04/20 0915  piperacillin-tazobactam (ZOSYN) IVPB 3.375 g        3.375 g 100 mL/hr over 30 Minutes Intravenous  Once 12/04/20 0818 12/04/20 1207   11/20/2020 0945  cefoTEtan (CEFOTAN) 2 g in sodium chloride 0.9 % 100 mL IVPB  Status:  Discontinued        2 g 200 mL/hr over 30 Minutes Intravenous To Surgery 12/07/2020 0936 11/29/2020 1057   12/08/2020 0900  cefoTEtan (CEFOTAN) 2 g in sodium chloride 0.9 % 100 mL IVPB  Status:  Discontinued        2 g 200 mL/hr over 30 Minutes Intravenous  Once 12/02/2020 0833 12/09/2020 0936   11/19/20 1130  piperacillin-tazobactam (ZOSYN) IVPB 3.375 g        3.375 g 12.5 mL/hr over 240 Minutes Intravenous Every 8 hours 11/19/20 1017 11/25/2020 2359   11/19/20 1115  fluconazole (DIFLUCAN) IVPB 200 mg        200 mg 100 mL/hr over 60 Minutes Intravenous Every 24 hours 11/19/20 1017 11/21/20 1104   11/19/20 0915  Ampicillin-Sulbactam (UNASYN) 3 g in sodium chloride 0.9 % 100 mL IVPB  Status:  Discontinued        3 g 200 mL/hr over 30 Minutes Intravenous Every 12 hours 11/19/20 0826 11/19/20 0949       Assessment/Plan SBO -s/p ex lap with SBR, placement of a gastrostomy  tube12/7 by Dr. Georgette Dover. Takeback 12/12 by Dr. Zenia Resides for necrotic SB with resection of additional 60-70cm of SB inclusive of prior anastomosis in discontinuity with open abdomen. - S/P ex lap, ileostomy, mucous fistula, closure 12/15 by Dr. Grandville Silos Malnourishment/FEN-Continue TNA. Continue TF, although patient is really not absorbing any enteral nutrition. Continue fiber,imodium, lomotil, tincture of opium.  Question posed about feeding or  refeeding mucous fistula-I am not sure there is much bowel distal to this and I do not think this is going to change his overall trajectory. VDRF-s/p tracheostomy 12/29 ID-currently zosyn1/9>> (for E coli and Achromobacter xylosoxidans in Rcx, CCM also plans to continue this 5 days after IR drain removal 1/13).vancomycin 1/9>>1/10. unasyn 12/31>>1/9. Zosyn 1/19 - 1/18 per CCM Intra-abdominal abscess - seen on CT 12/23,s/p perc drain by IR 12/24. Culture with Enterococcus faecalis and Bacteroides fragilis, beta lactamase positive.CT 1/9 showed no evidence of significant residual fluid collection and no new fluid collections. IRdrain injection study negative therefore drain removed 1/13 CV/AF RVR- having PVCs, several recent episodes of asystole  VTE-SQH Dispo - Patient was refused by [facility] due to poor long-term prognosis. Palliative care is involved. We are available to support with family discussions as well.   LOS: 58 days   [facility] Use [email or web address] to contact on call provider

## 2021-01-08 NOTE — Progress Notes (Signed)
Daily Progress Note   Patient Name: Ronald Wolf       Date: 01/08/2021 DOB: 06-04-1946  Age: 75 y.o. MRN#: [id number] Attending Physician: Jacky Kindle, MD Primary Care Physician: Mosie Lukes, MD Admit Date: 11/14/2020  Reason for Consultation/Follow-up: Establishing goals of care  Subjective: Patient opens eyes to my voice- does not follow commands. Placed back on full vent support with increased WOB and desaturation to 85% this morning. He has tube feeding- but per surgery note it is not likely he is absorbing. Attempted to call daughter Ronald Wolf- no answer. Left message requesting return call.  Per RN patient's sister is coming to visit him.    Length of Stay: 41  Current Medications: Scheduled Meds:  . chlorhexidine gluconate (MEDLINE KIT)  15 mL Mouth Rinse BID  . Chlorhexidine Gluconate Cloth  6 each Topical Daily  . diphenoxylate-atropine  2 tablet Per Tube QID  . feeding supplement (PROSource TF)  45 mL Per Tube TID  . fiber  1 packet Per Tube TID  . free water  200 mL Per Tube Q4H  . heparin injection (subcutaneous)  5,000 Units Subcutaneous Q8H  . insulin aspart  0-15 Units Subcutaneous Q4H  . levETIRAcetam  750 mg Per Tube BID  . loperamide HCl  2 mg Per Tube Q3H  . mouth rinse  15 mL Mouth Rinse 10 times per day  . neomycin-bacitracin-polymyxin   Topical Daily  . octreotide  100 mcg Subcutaneous Q12H  . Opium  4 drop Per Tube Q6H  . pantoprazole sodium  40 mg Per Tube QHS  . sodium chloride flush  10-40 mL Intracatheter Q12H  . sodium chloride flush  5 mL Intracatheter Q8H    Continuous Infusions: . sodium chloride 10 mL/hr at 01/02/21 1735  . sodium chloride Stopped (12/21/20 0526)  . feeding supplement (VITAL AF 1.2 CAL) 20 mL/hr at 01/07/21 2000  .  HYDROmorphone 2 mg/hr (01/08/21 1223)  . TPN ADULT (ION) 100 mL/hr at 01/08/21 1200  . TPN ADULT (ION)      PRN Meds: sodium chloride, acetaminophen **OR** acetaminophen, fentaNYL (SUBLIMAZE) injection, hydrALAZINE, HYDROmorphone, ondansetron (ZOFRAN) IV, sodium chloride flush  Physical Exam Vitals and nursing note reviewed.  Constitutional:      Comments: Frail, cachetic  Pulmonary:     Comments: labored Neurological:  Comments: nonverbal             Vital Signs: BP 115/60 (BP Location: Right Arm)   Pulse (!) 112   Temp 99.7 F (37.6 C) (Oral)   Resp (!) 27   Ht 5' 11"  (1.803 m)   Wt 66.7 kg   SpO2 100%   BMI 20.51 kg/m  SpO2: SpO2: 100 % O2 Device: O2 Device: Ventilator O2 Flow Rate: O2 Flow Rate (L/min): 8 L/min  Intake/output summary:   Intake/Output Summary (Last 24 hours) at 01/08/2021 1252 Last data filed at 01/08/2021 1200 Gross per 24 hour  Intake 2958.14 ml  Output 4455 ml  Net -1496.86 ml   LBM: Last BM Date: 01/08/21 Baseline Weight: Weight: 57.6 kg Most recent weight: Weight: 66.7 kg       Palliative Assessment/Data: PPS: 10%    Flowsheet Rows   Flowsheet Row Most Recent Value  Intake Tab   Referral Department Critical care  Unit at Time of Referral ICU  Date Notified 11/25/20  Palliative Care Type New Palliative care  Reason for referral Clarify Goals of Care  Date of Admission 11/24/2020  Date first seen by Palliative Care 11/28/20  # of days Palliative referral response time 3 Day(s)  # of days IP prior to Palliative referral 13  Clinical Assessment   Psychosocial & Spiritual Assessment   Palliative Care Outcomes       Patient Active Problem List   Diagnosis Date Noted  . Status post tracheostomy ([place])   . Surgical abdomen   . Pressure injury of skin 12/12/2020  . Pelvic abscess in male [facility])   . Encephalopathy acute   . Respiratory failure ([place])   . Shock ([place])   . Protein-calorie malnutrition, severe 11/21/2020  .  Hypernatremia 11/13/2020  . AKI (acute kidney injury) ([place]) 11/13/2020  . Lactic acidosis 11/13/2020  . Small bowel obstruction ([place]) 12/03/2020  . Acute metabolic encephalopathy 19/37/9024  . Seizure ([place]) 12/16/2019  . Polysubstance abuse ([place]) 12/16/2019  . History of stroke 12/16/2019  . Neurocognitive deficits 12/26/2018  . CVA (cerebral vascular accident) ([place]) 12/17/2018  . 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 Stroke ([place]) 12/20/2017  . Decreased visual acuity 06/18/2015  . History of bladder cancer 04/24/2015  . History of UTI 03/27/2015  . UTI (urinary tract infection) 11/21/2014  . Fatigue 10/23/2014  . Hyperglycemia 10/23/2014  . PEG (percutaneous endoscopic gastrostomy) adjustment/replacement/removal ([place]) 10/23/2014  . PVD (peripheral vascular disease) ([place]) 07/28/2014  . Anxiety and depression 07/28/2014  . Palpitations 06/21/2014  . Cough 09/26/2013  . Atherosclerosis of arteries 05/13/2013  . Smoking 05/03/2013  . Lesion of liver 04/01/2013  . Rhinitis 10/05/2012  . Chronic scapular pain 10/05/2012  . Anemia 09/17/2012  . Bone lesion 09/17/2012  . HTN (hypertension) 06/12/2012  . Trigeminal neuralgia 05/28/2012    Palliative Care Assessment & Plan   Patient Profile: 75 y.o.malewith past medical history of bladder cancer s/p urostomy, seizure, disorder, right glottic cancer s/p excision, atrial fibrillation, tobacco abuse, CVA, hepatitis C, anxiety and depressionwho was admitted on 12/1/2021with small bowel obstruction. He had [facility]. Initially he was treated with conservative measures but on 12/7 he underwent exploratory laproscopy and part of his small bowel was resected.He was taken back to the OR on 12/12 for further bowel resection. On 12/15 he had abdominal closure and ileostomy placement. During this time he has suffered from respiratory  failure and been intubated 3 times. At the time of this initial consultation he is not weaning from the vent and vent settings are too high to allow for tracheostomy placement. Patient is quite cachectic and remains on TPN.Albumin 1.6.  Assessment/Recommendations/Plan  Patient is not improving, he is slowing dying, current interventions are only prolonging his suffering and dying Recommend transition to full comfort measures and allow for natural dying process Unable to reach family  It is my professional opinion that with a high degree of medical certainty that this patient has an incurable and irreversible condition that will result in their death within a relatively short period of time. All medically appropriate and reasonable medical interventions have and are being done according to accepted practice and standards of care. In the event of a cardiac or respiratory arrest, an attempt at resuscitation would be unsuccessful at returning this patient to an already fragile and seriously ill state of health and cause unnecessary suffering at the end of his life. I support placing and maintaining a DNR order and support the care plan as outlined by the primary service.   Goals of Care and Additional Recommendations: Limitations on Scope of Treatment: Full Scope Treatment  Code Status: Limited code  Prognosis:  Unable to determine  Discharge Planning: To Be Determined  Care plan was discussed with patient's nurse.   Thank you for allowing the Palliative Medicine Team to assist in the care of this patient.   Total time: 26 mins Greater than 50%  of this time was spent counseling and coordinating care related to the above assessment and plan.  Mariana Kaufman, AGNP-C Palliative Medicine   Please contact Palliative Medicine Team phone at [phone] for questions and concerns.

## 2021-01-08 NOTE — Progress Notes (Signed)
Palliative medicine-   Additional face to face encounter:   Met with patient's sister- Teresa Pinckney. I reviewed the information that I previously discussed with Ulonda (see previous note).  Teresa shares that Ulonda and her siblings are having difficulty in transitioning to comfort due to the fact that a few years ago- they had similar discussions regarding their mother- who is doing very well now and went to a movie with them last week.  However, Teresa does understand- and believes that Ulonda is beginning to understand- that Mr. Canizares is in a different state of health than their mother was.   Teresa is clear in stating that she knows that her brother would choose not to continue the current interventions and would choose to transition to comfort measures and support through dying. However, it is the patient's children's who are the decision makers- she is correct.   Teresa is going to continue to discuss these issues with her family.   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    , AGNP-C Palliative Medicine  Please call Palliative Medicine team phone with any questions [phone]. For individual providers please see AMION.  Total time: 48 minutes         

## 2021-01-08 NOTE — Progress Notes (Signed)
Pt is back on full ventilator support due to increased wob and sustained 02 desaturation of 85%

## 2021-01-08 NOTE — Progress Notes (Signed)
NAME:  LINDA BIEHN, MRN:  [id number], DOB:  1946/05/27, LOS: 100 ADMISSION DATE:  12/08/2020, CONSULTATION DATE:  12/7 REFERRING MD:  Cyndia Skeeters, CHIEF COMPLAINT:  Abdominal pain   Brief History:  Mr.Cerney is a 75 yo M w/ PMH of CVA, Seizures, Dementia, Bladder ca s/p urostomy, tobacco use, right glottic carcinoma s/p excision presenting with sbo. Found to have ischemic bowel requiring ex-lap 12/7.  Course complicated by septic shock, acute respiratory failure and AKI return to the OR 12/12 for necrotic small bowel resection of additional 70 cm inclusive of prior anastomosis 12/15 abdominal closure with ileostomy He has had a prolonged ICU stay with ventilator dependence, had a tracheostomy on 12/29 Remains in ICU for ventilator dependence and arrhythmias  Past Medical History:  CVA, Dementia, Bladder CA s/p nephrostomy, tobacco use, right glottic carcinoma s/p excision, seizures.  Significant Hospital Events:  11/14/2020 Admission 11/22/2020 OR 11/28/2020 Admit to ICU 12/10 Brief SVT with associated hypertension. Self limited Given fentanyl, metop and hydral for HTN 12/11 Good urine output with Lasix , failed extubation, reintubated within 2 hours due to inability to clear secretions 12/12 return OR for enteric leak-- found to have bowel necrosis. Left in discontinuity  12/13 self extubated, reintubated. On pressors. Family meeting with PCCM and CCS to discuss possible next steps.  12/15-was back in OR, abdominal closure, RUQ mucus fistula and L ileostomy  12/19: No significant overnight events, high ileostomy output 12/21: scheduled for family meeting. Worse hypernatremia, continued high ileostomy output.  Attempted PSV/CPAP wean, pulled volumes of high 100s -low/mid 200s with tachypnea. Placed back of full support.  12/22 ongoing high output GI/ ileostomy, TPN started; weaned 12/5 all day; off fentanyl gtt, remains on precedex  12/24 IR drain , 1 unit PRBC transfusion for hemoglobin 6.5 12/26 added  vancomycin for GPC in blood cultures and RIC suggesting MRSA 12/29 tracheostomy 12/31 Palliative discussion > family understanding that his situation is critical and he has not made much improvement  1/6 ongoing palliative conversations. No progress from pt standpoint. 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  1/11 seen by surgery, IR consulted to remove percutaneous drain.  Added octreotide in effort to try to decrease output 1/12: More fatigued back on vent 1/14: Opens eyes to voice and grimaces to pain, ?tracking. Severely deconditioned and suspect long recovery requiring vent dependence at this point. Reconsult PT for developing contractures 1/15: Tolerated trach collar 1/16: Tolerated trach collar, improved alertness 1/17: Overnight had bradycardia. This morning 3 episodes of asystole <30-60s reported per nursing 1/24 Failed trach collar, back on vent support   Consults:  General Surgery PCCM Palliative Care  Procedures:  11/22/2020 Ex-lap, G-tube placement 12/7 PICC >> ETT 12/7 >> 12/11, >> 12/15- back to OR for abdominal closure, exploratory laparotomy  12/24 CT-guided right pelvic abscess drain 12/29 trach  Significant Diagnostic Tests:  CT abd/pelvis 12/2 > High-grade distal small bowel obstruction, possibly due to adhesion. Mild ascites and diffuse mesenteric edema. No evidence of focal inflammatory process or abscess. Small bilateral pleural effusions and mild right lower lobe Atelectasis. CT abdomen/pelvis 12/23 Interval development of a large loculated collection in the low anterior abdomen and pelvis measuring approximately  6.1 x 11.3 x12.1 cm in size  subjacent to the operative site. Few foci of internal gas are present. , no frank spillage of enteric contrast  CT A/P 1/9 Success drainage of fluid of pelvic fluid collection, bilateral pleural effusions  CT H 1/6 > no acute intracranial abnormality. Chronic L MCA changes from prior L MCA infarct. Atrophic and chronic white matter ischemic changes.  Stable L basal ganglia infarcts  CT abd pelvis 1/10: pelvis abscess drained. BLL effusions w/ atx. Expected post op changes    Micro Data:  11/17/2020 COVID/Flu negative 12/7 BA: Few GNR rare GPC -- predominantly PMN  12/7 BCx> Neg 12/11 respiratory -no organism in tracheal aspirate 12/24 BC >> RIC showing both staph epidermis 1/4 12/24 abscess >> E. Faecalis, B fragilis (B lactamase) 12/27 trach aspirate>> normal flora 1/8  Trach Achromobacter xylosoxidans and E.coli 1/8 BCX> Neg  Antimicrobials:  Fluc 12/8>> 12/10 Zosyn 12/8> 12/12 cefotetain 12/15>> off Zosyn 12/23 >> 12/30 eraxis 12/24 >> 1/2 Vancomycin 12/26 >>12/31 Unasyn 12/31 >1/9 zosyn 1/9>>1/18 vanc 1/9 >>off  Interim History / Subjective:  No acute overnight events  Objective   Blood pressure 125/65, pulse (!) 107, temperature 99 F (37.2 C), temperature source Oral, resp. rate (!) 29, height 5\' 11"  (1.803 m), weight 66.7 kg, SpO2 98 %.    Vent Mode: PSV FiO2 (%):  [40 %] 40 % Set Rate:  [15 bmp] 15 bmp Vt Set:  [600 mL] 600 mL PEEP:  [5 cmH20] 5 cmH20 Pressure Support:  [8 cmH20] 8 cmH20 Plateau Pressure:  [24 cmH20] 24 cmH20   Intake/Output Summary (Last 24 hours) at 01/08/2021 [id number] Last data filed at 01/08/2021 0800 Gross per 24 hour  Intake 3196.62 ml  Output 4575 ml  Net -1378.38 ml   Filed Weights   01/06/21 0500 01/07/21 0500 01/08/21 0345  Weight: 67 kg 67.3 kg 66.7 kg   General: Chronically and critically ill-appearing male, awake and in no distress  HEENT: MM pink/moist Neuro: Eyes open, not following commands CV: s1s2 sinus rhythm, no  m/r/g PULM: Mechanical breath sounds throughout, transitioned to trach collar however had desaturations and was placed back on ventilator support GI: soft, bsx4 active, ileostomy in place Extremities: warm/dry, no edema  Skin: no rashes or lesions    Resolved Hospital Problem list   HCAP  Ecoli and achromobacter xylosoxidans s/p >5d Zosyn  Assessment & Plan:   Ischemic bowel perf s/p ex-lap x 2 for resection S/p mucus fistula ileostomy, G tube placement  Complicated by intra-abdominal abscess s/p drain removal 1/13 Severe protein calorie malnutrition High ileostomy output (>1200cc/dail) 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 Acute on chronic hypoxemic respiratory failure Trach/Vent dependence  Bilateral pleural effusions due to malnutrition  Trach bleeding due to suction trauma High risk for needing continuous mechanical ventilation due to deconditioning in setting of severe protein calorie malnutrition. Replaced on vent on 1/17 Acute encephalopathy, multifactorial, critical illness, uremia.  Prior L MCA CVA Severe deconditioning Contractures Hypertension, uncontrolled Tachybradycardia syndrome Atrial fibrillation with RVR Seizures well controlled Bladder Cancer Also has history of penis prosthetic Given his severe anasarca urology did evaluate the patient on 1/12 to ensure prosthetic did not appear infected they felt no evidence of infection Anemia - stable    Plan: -overall stable, but not progressing, continued high ileostomy output slightly improved to 1130 yesterday, continue lomotil, fiber, tincture of opium, Immodium -Tolerated pressure support well for 24 hours, however did not tolerate transition to trach  collar today without desaturation.  Placed back on ventilator support.  Continue attempting trach collar trials as tolerated -Continue TPN -continue Octreotide -Continue Keppra -on Dilaudid IV and PO for pain control,  reduced doses today -overall no significant medical progress, he is DNR, palliative consulted -Unable to reach patient's daughter today --Maintain full vent support with SAT/SBT as tolerated -titrate Vent setting to maintain SpO2 greater than or equal to 90%. -HOB elevated 30 degrees. -Plateau pressures less than 30 cm H20.  -Follow chest x-ray, ABG prn.   -Bronchial hygiene and RT/bronchodilator protocol.    Best practice:  Diet: TPN Pain/Anxiety/Delirium protocol (if indicated): dilaudid gtt VAP protocol (if indicated): in place DVT prophylaxis: heparin GI prophylaxis: ppi Glucose control: ssi Mobility: br Code Status: vent okay, no compressions/shocks Family Communication: pending Disposition: ICU pending vent liberation   Goals of Care:  Palliative requested daughter call back to schedule family meetng 1/24, we have been unable to reach her to discuss ongoing [place]   CRITICAL CARE Performed by: Otilio Carpen Shravan Salahuddin   Total critical care time: 35 minutes  Critical care time was exclusive of separately billable procedures and treating other patients.  Critical care was necessary to treat or prevent imminent or life-threatening deterioration.  Critical care was time spent personally by me on the following activities: development of treatment plan with patient and/or surrogate as well as nursing, discussions with consultants, evaluation of patient's response to treatment, examination of patient, obtaining history from patient or surrogate, ordering and performing treatments and interventions, ordering and review of laboratory studies, ordering and review of radiographic studies, pulse oximetry and re-evaluation of patient's condition.  Otilio Carpen Alba Perillo, PA-C  PCCM  See Amion for pager details

## 2021-01-09 ENCOUNTER — Inpatient Hospital Stay (HOSPITAL_COMMUNITY): Payer: Medicare PPO

## 2021-01-09 DIAGNOSIS — R0902 Hypoxemia: Secondary | ICD-10-CM | POA: Diagnosis not present

## 2021-01-09 LAB — BASIC METABOLIC PANEL
Anion gap: 7 (ref 5–15)
BUN: 81 mg/dL — ABNORMAL HIGH (ref 8–23)
CO2: 24 mmol/L (ref 22–32)
Calcium: 8.4 mg/dL — ABNORMAL LOW (ref 8.9–10.3)
Chloride: 102 mmol/L (ref 98–111)
Creatinine, Ser: 1.04 mg/dL (ref 0.61–1.24)
GFR, Estimated: >60 mL/min (ref >60)
Glucose, Bld: 177 mg/dL — ABNORMAL HIGH (ref 70–99)
Potassium: 4 mmol/L (ref 3.5–5.1)
Sodium: 133 mmol/L — ABNORMAL LOW (ref 135–145)

## 2021-01-09 LAB — CBC
HCT: 26.9 % — ABNORMAL LOW (ref 39.0–52.0)
Hemoglobin: 8.2 g/dL — ABNORMAL LOW (ref 13.0–17.0)
MCH: 30.5 pg (ref 26.0–34.0)
MCHC: 30.5 g/dL (ref 30.0–36.0)
MCV: 100 fL (ref 80.0–100.0)
Platelets: 336 10*3/uL (ref 150–400)
RBC: 2.69 MIL/uL — ABNORMAL LOW (ref 4.22–5.81)
RDW: 20.2 % — ABNORMAL HIGH (ref 11.5–15.5)
WBC: 18.8 10*3/uL — ABNORMAL HIGH (ref 4.0–10.5)
nRBC: 0.1 % (ref 0.0–0.2)

## 2021-01-09 LAB — PROCALCITONIN: Procalcitonin: 0.52 ng/mL

## 2021-01-09 LAB — GLUCOSE, CAPILLARY
Glucose-Capillary: 175 mg/dL — ABNORMAL HIGH (ref 70–99)
Glucose-Capillary: 157 mg/dL — ABNORMAL HIGH (ref 70–99)
Glucose-Capillary: 165 mg/dL — ABNORMAL HIGH (ref 70–99)
Glucose-Capillary: 116 mg/dL — ABNORMAL HIGH (ref 70–99)
Glucose-Capillary: 155 mg/dL — ABNORMAL HIGH (ref 70–99)

## 2021-01-09 MED ORDER — TRACE MINERALS CU-MN-SE-ZN 300-55-60-3000 MCG/ML IV SOLN
INTRAVENOUS | Status: DC
  Filled 2021-01-09: qty 800

## 2021-01-09 NOTE — Progress Notes (Signed)
[facility] Progress Note Patient Name: Ronald Wolf DOB: [birth year] MRN: [id number]   Date of Service  01/09/2021  HPI/Events of Note  Patient family requesting an update from physician.  eICU Interventions  I spoke to patient's daughter via camera and gave her a realistic understanding of patient's clinical status, prospects, and the likelihood that he is suffering.        [name] 01/09/2021, 8:12 PM

## 2021-01-09 NOTE — Progress Notes (Signed)
eLink Physician-Brief Progress Note Patient Name: KIYAN BURMESTER DOB: [birth year] MRN: [id number]   Date of Service  01/09/2021  HPI/Events of Note  Patient vomited enteral nutrition x 2, bedside RN stopped enteral nutrition.  eICU Interventions  Stat KUB ordered to r/o ileus.        [name] 01/09/2021, 12:11 AM

## 2021-01-09 NOTE — Progress Notes (Addendum)
NAME:  Ronald Wolf, MRN:  [id number], DOB:  1946/08/17, LOS: 1 ADMISSION DATE:  11/19/2020, CONSULTATION DATE:  12/7 REFERRING MD:  Cyndia Skeeters, CHIEF COMPLAINT:  Abdominal pain   Brief History:  Ronald Wolf is a 75 yo M w/ PMH of CVA, Seizures, Dementia, Bladder ca s/p urostomy, tobacco use, right glottic carcinoma s/p excision presenting with sbo. Found to have ischemic bowel requiring ex-lap 12/7.  Course complicated by septic shock, acute respiratory failure and AKI return to the OR 12/12 for necrotic small bowel resection of additional 70 cm inclusive of prior anastomosis 12/15 abdominal closure with ileostomy He has had a prolonged ICU stay with ventilator dependence, had a tracheostomy on 12/29 Remains in ICU for ventilator dependence and arrhythmias  Past Medical History:  CVA, Dementia, Bladder CA s/p nephrostomy, tobacco use, right glottic carcinoma s/p excision, seizures.  Significant Hospital Events:  11/28/2020 Admission 11/17/2020 OR 11/27/2020 Admit to ICU 12/10 Brief SVT with associated hypertension. Self limited Given fentanyl, metop and hydral for HTN 12/11 Good urine output with Lasix , failed extubation, reintubated within 2 hours due to inability to clear secretions 12/12 return OR for enteric leak-- found to have bowel necrosis. Left in discontinuity  12/13 self extubated, reintubated. On pressors. Family meeting with PCCM and CCS to discuss possible next steps.  12/15-was back in OR, abdominal closure, RUQ mucus fistula and L ileostomy  12/19: No significant overnight events, high ileostomy output 12/21: scheduled for family meeting. Worse hypernatremia, continued high ileostomy output.  Attempted PSV/CPAP wean, pulled volumes of high 100s -low/mid 200s with tachypnea. Placed back of full support.  12/22 ongoing high output GI/ ileostomy, TPN started; weaned 12/5 all day; off fentanyl gtt, remains on precedex  12/24 IR drain , 1 unit PRBC transfusion for hemoglobin 6.5 12/26 added  vancomycin for GPC in blood cultures and RIC suggesting MRSA 12/29 tracheostomy 12/31 Palliative discussion > family understanding that his situation is critical and he has not made much improvement  1/6 ongoing palliative conversations. No progress from pt standpoint. 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  1/11 seen by surgery, IR consulted to remove percutaneous drain.  Added octreotide in effort to try to decrease output 1/12: More fatigued back on vent 1/14: Opens eyes to voice and grimaces to pain, ?tracking. Severely deconditioned and suspect long recovery requiring vent dependence at this point. Reconsult PT for developing contractures 1/15: Tolerated trach collar 1/16: Tolerated trach collar, improved alertness 1/17: Overnight had bradycardia. This morning 3 episodes of asystole <30-60s reported per nursing 1/20: visible discomfort, started on hydromorphone  1/21: family wishes for ongoing aggressive care and target destination care facility -- refused by [facility] due to poor prognosis. Prolongation of dying process again explained to family  1/24 palliative unable to reach family. Unable to try TC due to debilitation and poor tolerance of PSV 1/26: remains vent dependent -- intolerant of PSV for more than a couple hours thus unable to try TC. 1/27: palliative had multiple conversations with family.   1/28: vomited EN, EN held. WBC increase to 18 from 11.  Consults:  General Surgery PCCM Palliative Care  Procedures:  12/12/2020 Ex-lap, G-tube placement 12/7 PICC >>  ETT 12/7 >> 12/11, >> 12/15- back to OR for abdominal closure, exploratory laparotomy  12/24 CT-guided  right pelvic abscess drain 12/29 trach  Significant Diagnostic Tests:  CT abd/pelvis 12/2 > High-grade distal small bowel obstruction, possibly due to adhesion. Mild ascites and diffuse mesenteric edema. No evidence of focal inflammatory process or abscess. Small bilateral pleural effusions and mild right lower lobe Atelectasis. CT abdomen/pelvis 12/23 Interval development of a large loculated collection in the low anterior abdomen and pelvis measuring approximately 6.1 x 11.3 x12.1 cm in size subjacent to the operative site. Few foci of internal gas are present. , no frank spillage of enteric contrast  CT A/P 1/9 Success drainage of fluid of pelvic fluid collection, bilateral pleural effusions  CT H 1/6 > no acute intracranial abnormality. Chronic L MCA changes from prior L MCA infarct. Atrophic and chronic white matter ischemic changes.  Stable L basal ganglia infarcts  CT abd pelvis 1/10: pelvis abscess drained. BLL effusions w/ atx. Expected post op changes    Micro Data:  12/01/2020 COVID/Flu negative 12/7 BA: Few GNR rare GPC -- predominantly PMN  12/7 BCx> Neg 12/11 respiratory -no organism in tracheal aspirate 12/24 BC >> RIC showing both staph epidermis 1/4 12/24 abscess >> E. Faecalis, B fragilis (B lactamase) 12/27 trach aspirate>> normal flora 1/8  Trach Achromobacter xylosoxidans and E.coli 1/8 BCX> Neg  Antimicrobials:  Fluc 12/8>> 12/10 Zosyn 12/8> 12/12 cefotetain 12/15>> off Zosyn 12/23 >> 12/30 eraxis 12/24 >> 1/2 Vancomycin 12/26 >>12/31 Unasyn 12/31 >1/9 zosyn 1/9>> vanc 1/9 >>off  Interim History / Subjective:  EN held after pt vomited EN.  WBC increase to 18  Afebrile   Shared clinical course with patient and shared with him that I am concerned his body is slowly continuing to deteriorate and that despite all efforts during this hospitalization, this illness is not survivable to a functional outcome or to long-term survival. Ronald Wolf does furrow his brow  and look away, eyes do water-- so I think he understands to an extent. I express concern for suffering and ask if he is in pain. iHe furrows brow and opens mouth + some tongue movement but I am not able to discern what he is trying to articulate. Appears uncomfortable.   I visited with Mr. Kuhl for a while before/after exam. He maintained arms crossed over his abdomen and not allow me to examine his abdomen (pulling his arms against his body and increasing muscle tone when I asked if I could move his arms & tried to assist in moving arms). I removed R mitten to see if tone was increased globally or if contracted due to severe contractions. His hand softened when I removed mitten and I asked if he wanted me to hold his hand to which he looked at me so I held hand and offered emotional support. I asked if he was religious or spiritual and if he wanted prayer at this time to which he squeezed my hand tightly and closed his eyes.  I prayed for peace, understanding and alleviation of pain and suffering, and for support of [place] and his family during this challenging time.   I told Arn he is not alone and we are here with him.   Objective   Blood pressure (!) 110/56, pulse (!) 101, temperature 98.9 F (37.2 C), resp. rate 16, height 5\' 11"  (1.803 m), weight 67.1 kg, SpO2 100 %.    Vent Mode: PRVC FiO2 (%):  [40 %-60 %] 40 %  Set Rate:  [14 bmp] 14 bmp Vt Set:  [600 mL] 600 mL PEEP:  [5 cmH20] 5 cmH20 Pressure Support:  [8 cmH20] 8 cmH20 Plateau Pressure:  [27 cmH20-29 cmH20] 28 cmH20   Intake/Output Summary (Last 24 hours) at 01/09/2021 0724 Last data filed at 01/09/2021 0700 Gross per 24 hour  Intake 2716.11 ml  Output 2255 ml  Net 461.11 ml   Filed Weights   01/07/21 0500 01/08/21 0345 01/09/21 0600  Weight: 67.3 kg 66.7 kg 67.1 kg   General:  Critically and chronically ill appearing older adult M, very frail and debilitated appearing, trach/vent no distress but is uncomfortable appearing   HEENT: Temporal muscle wasting. Pink mm. Trachea midline. Trach secure.  Neuro: Lethargic. Opens eyes to command. Makes eye contact. May have tried to answer questions. Will not follow extremity commands but can squeeze hand spontaneously a CV: tachycardic rate reg rhythm. s1s2 no rgm  PULM:  Symmetrical chest expansion, clear anterior sounds. Mechanically ventilated  GI: Patient non-compliant with GI examination.  Extremities:  No cyanosis or clubbing. LUE mitten. BLE boots. BUE crossed in front of chest/abdomen.  Skin: c/d/w   Resolved Hospital Problem list   HCAP  Ecoli and achromobacter xylosoxidans s/p >5d Zosyn shock  Assessment & Plan:   SBO, Ischemic Bowel -s/p ex lap with SBR and G tube 12/7  -s/p takeback 12/12 with necrotic SBR. 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 Will hold EN.  -continues on fiber, opium, lomotil in attempt to slow transit time  -on TPN, poor candidate for long-term parenteral support   Acute Hypoxic respiratory failure requiring tracheostomy VDRF  Bilateral pleural effusions due to malnutrition  Tracheal bleeding due to suction trauma, intermittent  P -continue efforts at vent weaning with goal TC. Have been limited by intolerance of PSV/CPAP. -PRVC when full support needed -Pulm hygiene, VAP -Routine trach care   Acute encephalopathy, multifactorial -Critical illness, CNS depressing medications, profound debility, uremia, prior septic shock Pain P -hydromorphone gtt -continue to re-orient and update pt  -cont neuro exams   HTN Afib RVR, improved Tachybrady syndrome, improved S/p cardiac arrest this  admission with ROSC P -cardiac monitoring -PRN hydral for HTN. Consider pain as etiology as well   Leukocytosis  -(18.8 on 1/28) -hospital course c/b numerous infections. Afebrile 1/28.  -possibly reactive vs infectious.  Anemia, stable P -check PCT.  -Will defer initiation of empiric abx and cont to trend CBA, fever curve  Hx L MCA CVA Hx seizures, well controlled  Hx bladder cancer P -Keppra -urostomy care  GOC: The patient is not improving despite prolonged aggressive interventions. I agree with palliative care concerns that the patient is slowly dying and current measures are not improving long-term outcome but are prolonging the dying process. This has been articulated to family by numerous providers.   Given prognosis/outcome, and prolongation of suffering, I continue to believe that medical therapy aimed at comfort and EOL care would be most appropriate.   The family has historically favored continue aggressive care with hope for destination LTACH, however pt has been declined LTACH placement due to poor prognosis.  Palliative care continues to follow, and we greatly appreciate their involvement and support.  Code status - partial: no CPR/DF   Best practice:  Diet: TPN Pain/Anxiety/Delirium protocol (if indicated): dilaudid gtt VAP protocol (if indicated): in place DVT prophylaxis: heparin GI prophylaxis: ppi Glucose control: ssi Mobility: br Code Status: vent okay, no compressions/shocks Family Communication: Pending 1/28 Disposition: ICU   Goals of Care:  -palliative following.   CRITICAL CARE Performed by: Cristal Generous   Total critical care time: 40 minutes  Critical care time was exclusive of separately billable procedures and treating other patients.  Critical care was necessary to treat or prevent imminent or life-threatening deterioration.  Critical care was time spent personally by me on the following activities: development of treatment  plan with patient and/or surrogate as well as nursing, discussions with consultants, evaluation of patient's response to treatment, examination of patient, obtaining history from patient or surrogate, ordering and performing treatments and interventions, ordering and review of laboratory studies, ordering and review of radiographic studies, pulse oximetry and re-evaluation of patient's condition.  Eliseo Gum MSN, AGACNP-BC [place] [phone] If no answer, [phone] 01/09/2021, 7:24 AM

## 2021-01-09 NOTE — Progress Notes (Signed)
PCCM Family communication Note   I called daughter Ronald Wolf approx 330pm (had requested updates after 3pm). Discussed his lack of positive progress, new emesis + continued high ileostomy output, and apparent pain/discomfort.   I shared my concern that current interventions are not providing benefit and that the patient is slowly deteriorating. I shared my interaction with the patient wherein he seemed to have at least some understanding of his decline and nearing EOL. I plainly stated that I am concerned the patient is suffering, and my concern that current aggressive measures are prolonging suffering and the dying process.   Ronald Wolf states she does not want her dad to suffer but historically has wanted to maintain aggressive measures. I suggest coming to hospital to see pt (has not seen in a few weeks) and to discuss [place]. She plans to come this afternoon, time TBD   _______________  I subsequently provided updates to pts sister Clarene Critchley. Clarene Critchley shares her concern that the patient is suffering. She, for the first time, saw the extent of the pts abdominal interventions and shares that this was a "lightbulb moment" of sorts for his ongoing poor condition. Clarene Critchley has tried to speak with family and communicated her believe that the patient is suffering and that, after seeing his abdomen, he would have not wanted these interventions or QOL. Clarene Critchley states "I never knew there were worse things than [place], but he is in [place] on earth here and now... all off this... Hell. " Emotional support provided. We discussed care trajectories -- I shared very plainly that I recommend comfort care. Clarene Critchley plans to maintain ongoing dialogue with family.  We shared our mutual hope for ongoing [place] discussions in this very unfortunate case.  All questions answered.    Eliseo Gum MSN, AGACNP-BC [facility] 01/09/2021, 4:36 PM

## 2021-01-09 NOTE — Progress Notes (Signed)
PHARMACY - TOTAL PARENTERAL NUTRITION CONSULT NOTE  Indication: Small bowel obstruction, prolonged ileus  Patient Measurements: Height: _0  (180.3 cm) Weight: 61.2 kg (134 lb 14.7 oz) IBW/kg (Calculated) : 75.3 TPN AdjBW (KG): 60.8 Body mass index is 18.82 kg/m. Usual Weight: 137 lbs  Assessment: Ronald Wolf presented 12/06/2020 with nausea, vomiting, abdominal pain after leaving [facility]MA at [facility] with known SBO. Patient has been on bowel rest with NG tube decompression. Patient continued to have persistent SBO on imaging with no evidence of improvement, now s/p ex-lap with LoA, SBR and placement of Gtube on 11/25/2020. Pharmacy consulted for TPN management - patient received TPN 11/19/20 through 11/29/20.  Reconsulted to resume TPN on 12/2. TF currently at 20 mL/hr given high ostomy output and concern for tube feeds not absorbing. Patient on loperamide, lomotil, tincture of opium, morphine sulfate, octreotide and fiber without improvement in output.   Glucose / Insulin: no hx DM - CBGs controlled. Utilized 14 units SSI in 24 hrs Electrolytes: Na 133 (on FW 200 q4), K 4 (goal >/= 4 for ileus), CoCa ~10.1 (none in TPN), Mag 2 (goal >/= 2 for ileus) Renal: SCr up 1.04 << 0.98, BUN 81 LFTs / TGs: alk phos 146, LFTs ok Prealbumin / albumin: prealbumin up to 25.6, albumin 1.8 Intake / Output; MIVF: UOP 0.9 ml/kg/hr, ileostomy 820 mL (on fiber, opium tincture, lomotil, loperamide, morphine sulfate, octreotide and unlikely to improve per Surgery) GI Imaging: 12/11 DG Abd - stomach gas-filled, without distention, no obvious free air 12/23 CT abd pelvis - large loculated collection in low abd/pelvis, few foci of internal gas present, new RUQ ileostomy, thickened distal SB/colon, subtotal atelectatic collapse of LLL, partial collapse of RLL, small R pleural effusion, body wall edema Surgeries / Procedures: 12/12: re-exploration and washout with new copious drainage from incision concerning for enteric leak vs.  enterotomy >> now s/p, bowel necrosis resected 12/15: ex-lap with ileostomy with fistula creation, abdominal closure 12/24 pelvic abscess drain (removed 1/13) 1/19 PEG tube replaced  Central access: PICC placed 12/03/2020 TPN start date: 11/19/20>>11/29/20; restart 12/03/21  Nutritional Goals (per RD rec 1/25): 2300-2700 kCal, 120-135g protein, >2L fluid per day  Current Nutrition:  TPN (increased 75 >> 100 ml/hr on 1/17 to provide more free water) Vital AF 1.2 at 20 ml/hr (not advancing d/t high ileostomy output) - on hold 1/28 AM due to emesis  Noted continual open conversations between care team and family on goals of care. Will continue to monitor for decision.   Plan:  Continue TPN at goal rate of 100 ml/hr at 1800, providing a weekly average of 2328 kCal and 120g AA per day TPN with 21g/L SMOFlipid MWF only due to shortage  Electrolytes in TPN: increase Na 40 mEq/L, add back low dose K 10 mEq/L, Ca removed 1/16, cont Mag to 22mq/L, cont Phos 548ml/L, max acetate Continue standard MVI and trace elements + folic acid to TPN Continue moderate SSI Q4H and adjust as needed Continue FW 20089mT Q4H per MD Monitor standard TPN labs on Mon/Thurs  F/u GOC discussions  Thank you for allowing pharmacy to be a part of this patient's care.  EliAlycia RossettiharmD, BCPS Clinical Pharmacist Clinical phone for 01/09/2021: [phone] 7:23 AM   **Pharmacist phone directory can now be found on [place] (PW [email or web address]).  Listed under [facility] [place]

## 2021-01-10 DIAGNOSIS — K56609 Unspecified intestinal obstruction, unspecified as to partial versus complete obstruction: Secondary | ICD-10-CM | POA: Diagnosis not present

## 2021-01-10 LAB — GLUCOSE, CAPILLARY
Glucose-Capillary: 162 mg/dL — ABNORMAL HIGH (ref 70–99)
Glucose-Capillary: 169 mg/dL — ABNORMAL HIGH (ref 70–99)
Glucose-Capillary: 85 mg/dL (ref 70–99)
Glucose-Capillary: 93 mg/dL (ref 70–99)
Glucose-Capillary: 93 mg/dL (ref 70–99)
Glucose-Capillary: 96 mg/dL (ref 70–99)
Glucose-Capillary: 97 mg/dL (ref 70–99)

## 2021-01-10 LAB — BASIC METABOLIC PANEL
Anion gap: 8 (ref 5–15)
BUN: 93 mg/dL — ABNORMAL HIGH (ref 8–23)
CO2: 24 mmol/L (ref 22–32)
Calcium: 8.3 mg/dL — ABNORMAL LOW (ref 8.9–10.3)
Chloride: 98 mmol/L (ref 98–111)
Creatinine, Ser: 1.07 mg/dL (ref 0.61–1.24)
GFR, Estimated: >60 mL/min (ref >60)
Glucose, Bld: 102 mg/dL — ABNORMAL HIGH (ref 70–99)
Potassium: 3.8 mmol/L (ref 3.5–5.1)
Sodium: 130 mmol/L — ABNORMAL LOW (ref 135–145)

## 2021-01-10 LAB — CBC
HCT: 25.9 % — ABNORMAL LOW (ref 39.0–52.0)
Hemoglobin: 7.9 g/dL — ABNORMAL LOW (ref 13.0–17.0)
MCH: 30.2 pg (ref 26.0–34.0)
MCHC: 30.5 g/dL (ref 30.0–36.0)
MCV: 98.9 fL (ref 80.0–100.0)
Platelets: 290 10*3/uL (ref 150–400)
RBC: 2.62 MIL/uL — ABNORMAL LOW (ref 4.22–5.81)
RDW: 19.9 % — ABNORMAL HIGH (ref 11.5–15.5)
WBC: 12.6 10*3/uL — ABNORMAL HIGH (ref 4.0–10.5)
nRBC: 0.2 % (ref 0.0–0.2)

## 2021-01-10 LAB — PROCALCITONIN: Procalcitonin: 0.46 ng/mL

## 2021-01-10 MED ORDER — FREE WATER
200.0000 mL | Freq: Three times a day (TID) | Status: DC
  Administered 2021-01-10 – 2021-01-12 (×6): 200 mL

## 2021-01-10 MED ORDER — POTASSIUM CHLORIDE 10 MEQ/50ML IV SOLN
10.0000 meq | INTRAVENOUS | Status: AC
  Administered 2021-01-10 (×2): 10 meq via INTRAVENOUS
  Filled 2021-01-10 (×2): qty 50

## 2021-01-10 NOTE — Progress Notes (Addendum)
NAME:  Ronald Wolf, MRN:  [id number], DOB:  [birth year], LOS: 26 ADMISSION DATE:  12/12/2020, CONSULTATION DATE:  12/7 REFERRING MD:  Cyndia Skeeters, CHIEF COMPLAINT:  Abdominal pain   Brief History:  Mr.Brummitt is a 75 yo M w/ PMH of CVA, Seizures, Dementia, Bladder ca s/p urostomy, tobacco use, right glottic carcinoma s/p excision presenting with sbo. Found to have ischemic bowel requiring ex-lap 12/7.  Course complicated by septic shock, acute respiratory failure and AKI return to the OR 12/12 for necrotic small bowel resection of additional 70 cm inclusive of prior anastomosis 12/15 abdominal closure with ileostomy He has had a prolonged ICU stay with ventilator dependence, had a tracheostomy on 12/29 Remains in ICU for ventilator dependence and arrhythmias  Past Medical History:  CVA, Dementia, Bladder CA s/p nephrostomy, tobacco use, right glottic carcinoma s/p excision, seizures.  Significant Hospital Events:  11/14/2020 Admission 12/09/2020 OR 11/17/2020 Admit to ICU 12/10 Brief SVT with associated hypertension. Self limited Given fentanyl, metop and hydral for HTN 12/11 Good urine output with Lasix , failed extubation, reintubated within 2 hours due to inability to clear secretions 12/12 return OR for enteric leak-- found to have bowel necrosis. Left in discontinuity  12/13 self extubated, reintubated. On pressors. Family meeting with PCCM and CCS to discuss possible next steps.  12/15-was back in OR, abdominal closure, RUQ mucus fistula and L ileostomy  12/19: No significant overnight events, high ileostomy output 12/21: scheduled for family meeting. Worse hypernatremia, continued high ileostomy output.  Attempted PSV/CPAP wean, pulled volumes of high 100s -low/mid 200s with tachypnea. Placed back of full support.  12/22 ongoing high output GI/ ileostomy, TPN started; weaned 12/5 all day; off fentanyl gtt, remains on precedex  12/24 IR drain , 1 unit PRBC transfusion for hemoglobin 6.5 12/26 added  vancomycin for GPC in blood cultures and RIC suggesting MRSA 12/29 tracheostomy 12/31 Palliative discussion > family understanding that his situation is critical and he has not made much improvement  1/6 ongoing palliative conversations. No progress from pt standpoint. 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  1/11 seen by surgery, IR consulted to remove percutaneous drain.  Added octreotide in effort to try to decrease output 1/12: More fatigued back on vent 1/14: Opens eyes to voice and grimaces to pain, ?tracking. Severely deconditioned and suspect long recovery requiring vent dependence at this point. Reconsult PT for developing contractures 1/15: Tolerated trach collar 1/16: Tolerated trach collar, improved alertness 1/17: Overnight had bradycardia. This morning 3 episodes of asystole <30-60s reported per nursing 1/20: visible discomfort, started on hydromorphone  1/21: family wishes for ongoing aggressive care and target destination care facility -- refused by [facility] due to poor prognosis. Prolongation of dying process again explained to family  1/24 palliative unable to reach family. Unable to try TC due to debilitation and poor tolerance of PSV 1/26: remains vent dependent -- intolerant of PSV for more than a couple hours thus unable to try TC. 1/27: palliative had multiple conversations with family.   1/28: vomited EN, EN held. WBC increase to 18 from 11. Daughter visited. Saw abdomen and was very emotional. 1/29 pt more lethargic.  Consults:  General Surgery PCCM  Palliative Care  Procedures:  11/28/2020 Ex-lap, G-tube placement 12/7 PICC >> ETT 12/7 >> 12/11,  >> 12/15- back to OR for abdominal closure, exploratory laparotomy  12/24 CT-guided right pelvic abscess drain 12/29 trach  Significant Diagnostic Tests:  CT abd/pelvis 12/2 > High-grade distal small bowel obstruction, possibly due to adhesion. Mild ascites and diffuse mesenteric edema. No evidence of focal inflammatory process or abscess. Small bilateral pleural effusions and mild right lower lobe Atelectasis. CT abdomen/pelvis 12/23 Interval development of a large loculated collection in the low anterior abdomen and pelvis measuring approximately 6.1 x 11.3 x12.1 cm in size subjacent to the operative site. Few foci of internal gas are present. , no frank spillage of enteric contrast  CT A/P 1/9 Success drainage of fluid of pelvic fluid collection, bilateral pleural effusions  CT H 1/6 > no acute intracranial abnormality. Chronic L MCA changes from prior L MCA infarct. Atrophic and chronic white matter ischemic changes.  Stable L basal ganglia infarcts  CT abd pelvis 1/10: pelvis abscess drained. BLL effusions w/ atx. Expected post op changes    Micro Data:  11/14/2020 COVID/Flu negative 12/7 BA: Few GNR rare GPC -- predominantly PMN  12/7 BCx> Neg 12/11 respiratory -no organism in tracheal aspirate 12/24 BC >> RIC showing both staph epidermis 1/4 12/24 abscess >> E. Faecalis, B fragilis (B lactamase) 12/27 trach aspirate>> normal flora 1/8  Trach Achromobacter xylosoxidans and E.coli 1/8 BCX> Neg  Antimicrobials:  Fluc 12/8>> 12/10 Zosyn 12/8> 12/12 cefotetain 12/15>> off Zosyn 12/23 >> 12/30 eraxis 12/24 >> 1/2 Vancomycin 12/26 >>12/31 Unasyn 12/31 >1/9 zosyn 1/9>> vanc 1/9 >>off  Interim History / Subjective:  Daughter Ulonda visited last night. She spoke w/ Dr. Lucile Shutters and saw her father's abdomen, and had many emotions processing the visual evidence of her father's declining physical state.  The patient is more withdrawn and lethargic this morning. His arm is sore to touch  around the PICC  Objective   Blood pressure 133/66, pulse 100, temperature 99.8 F (37.7 C), resp. rate (!) 21, height 5\' 11"  (1.803 m), weight 66.1 kg, SpO2 99 %.    Vent Mode: PRVC FiO2 (%):  [40 %] 40 % Set Rate:  [14 bmp] 14 bmp Vt Set:  [600 mL] 600 mL PEEP:  [5 cmH20] 5 cmH20 Pressure Support:  [10 cmH20] 10 cmH20 Plateau Pressure:  [13 cmH20-30 cmH20] 30 cmH20   Intake/Output Summary (Last 24 hours) at 01/10/2021 [id number] Last data filed at 01/10/2021 0700 Gross per 24 hour  Intake 1901.91 ml  Output 4542 ml  Net -2640.09 ml   Filed Weights   01/08/21 0345 01/09/21 0600 01/10/21 0300  Weight: 66.7 kg 67.1 kg 66.1 kg   General:  Debilitated, frail, cachectic critically ill appearing older adult M. Trach/vent. HEENT: Temporal muscle wasting trach secure anicteric sclera. Will not comply with oral exam  Neuro: Lethargic. Does not make eye contact, follow commands, or interact. Moving hands spontaneously. Grimacing and brow furrowing to stimulation. Psych: flat affect. Non-compliant with exam  CV: tachy, regular. s1s2 no rgm  PULM:  Upper airway rhonchi. Symmetrical chest expansion, mechanically ventilated. Thick tan tracheal secretions  GI: non-compliant with abdominal exam. Able to see superior aspect of abdominal wound.  Extremities:  No cyanosis or clubbing. BLE boots. LUE PICC Skin: c/d/w  Resolved Hospital Problem list   HCAP  Ecoli and achromobacter xylosoxidans s/p >5d Zosyn shock  Assessment & Plan:    GOC: Unfortunately despite best efforts and very aggressive medical interventions  and care, the patient's clinical status has not significantly improved and it is felt by multiple care teams (please see palliative, pccm and ccs notes) that prognosis is poor.   I agree with palliative care's recent notes and feel that the patient is suffering, current interventions are not improving overall outcome, and ongoing aggressive/invasive interventions are prolonging the dying  process I spoke with daughter [name] and sister Clarene Critchley 1/28 -- please see note.   I spoke with [name] 1/29, after she visited at bedside 1/28 evening and spoke w Dr. Lucile Shutters. [name] states that her dad is suffering and she does not want him to suffer. She states that she is "ready for the comfort care... but before we do can I call some family."  We discussed comfort care process. [name] affirms this is the desired care direction, but needs to determine timing.  I suggested 1/29 vs 1/30 given consensus regarding end of life and suffering.   Ulonda plans to discuss with family and call back regarding timing of comfort care transition  I have additionally updated sister, Clarene Critchley, who reiterates her feelings of Cosimo's ongoing suffering and want for him not to suffer. I recommended she talk with [name] regarding our discussion.     SBO, Ischemic Bowel -s/p ex lap with SBR and G tube 12/7  -s/p takeback 12/12 with necrotic SBR. 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 Decr FWF-- will dc when comfort -dc TPN 1/29 -- arm is sore from tubing pulling on picc site ,continuing tpn is not adding to overall condition improvement and family is moving to comfort care when vent liberation time decided  Acute hypoxic respiratory failure requiring tracheostomy VDRF Intermittent tracheal bleeding due to suction trauma P -continue MV support, routine trach care   Acute encephalopathy, multifactorial -Critical illness, CNS depressing medications, profound debility, uremia, prior septic shock Pain P -hydromorphone gtt and bolus for pain management    HTN Afib RVR,  improved Tachybrady syndrome, improved S/p cardiac arrest this admission with ROSC P -PRN hydral for HTN.   Leukocytosis  -(18.8 on 1/28) -hospital course c/b numerous infections. Afebrile 1/28.  -possibly reactive vs infectious.  Anemia, stable P -defer abx initiation   Hx L MCA CVA Hx seizures, well controlled  Hx bladder cancer P -Keppra -- continue this when transitions to comfort care -urostomy care     Best practice:  Diet: none Pain/Anxiety/Delirium protocol (if indicated): dilaudid gtt VAP protocol (if indicated): in place DVT prophylaxis: heparin GI prophylaxis: ppi Glucose control: ssi Mobility: br Code Status: vent okay, no compressions/shocks Family Communication: 1/29 d/w daughter [name] Disposition: ICU   Goals of Care:  -palliative following.    CRITICAL CARE Performed by: Cristal Generous   Total critical care time: 52 minutes  Critical care time was exclusive of separately billable procedures and treating other patients. Critical care was necessary to treat or prevent imminent or life-threatening deterioration. Critical care was time spent personally by me on the following activities: development of treatment plan with patient and/or surrogate as well as nursing, discussions with consultants, evaluation of patient's response to treatment, examination of patient, obtaining history from patient or surrogate, ordering and performing treatments and interventions, ordering and review of laboratory  studies, ordering and review of radiographic studies, pulse oximetry and re-evaluation of patient's condition.   Eliseo Gum MSN, AGACNP-BC [place] [phone] If no answer, [id number] 01/10/2021, 11:20 AM

## 2021-01-10 NOTE — Progress Notes (Signed)
Wasted approximately 5-71mL of Dilaudid from expired bag. Lynelle Smoke RN witnessed.  Beckie Salts RN

## 2021-01-10 NOTE — Progress Notes (Addendum)
PCCM Progress Note  I spoke with Ronald Wolf who tentatively wishes to transition to comfort care on Monday 1/31.   I am concerned that this may change --  Ronald Wolf expressed the weight and pressure of this decision. I offered emotional support and said that certainly no one is extending pressure, however there is growing concern by medical team members and several of pts family members (including Ronald Wolf)  that ongoing measures are causing and prolonging suffering.  There are ongoing sentiments of wanting "more time" while also not wanting Ronald Wolf to suffer. We discussed the difficulties of these transitions and decisions, but the importance of maintaining the patient at the center of these decisions.   Will continue interventions as per today's progress note.   Conversation witnessed by Thayer Headings RN, Clydene Fake RN _________________________________  In my professional opinion, I think we have reached a point where current interventions are causing more harm for the patient  than benefit (in causing ongoing physical and likely emotional pain, continuing interventions the pt had previously expressed he did not want and prolonging the dying process) and are futile in scope of overall prognosis. If current measures are continued beyond Monday when family has indicated decision for transition to comfort care, I would suggest ethics consult regarding futility of care.    Eliseo Gum MSN, AGACNP-BC [place] [phone] If no answer, [phone] 01/10/2021, 5:27 PM

## 2021-01-11 DIAGNOSIS — J9610 Chronic respiratory failure, unspecified whether with hypoxia or hypercapnia: Secondary | ICD-10-CM | POA: Diagnosis not present

## 2021-01-11 DIAGNOSIS — K56609 Unspecified intestinal obstruction, unspecified as to partial versus complete obstruction: Secondary | ICD-10-CM | POA: Diagnosis not present

## 2021-01-11 DIAGNOSIS — Z515 Encounter for palliative care: Secondary | ICD-10-CM | POA: Diagnosis not present

## 2021-01-11 DIAGNOSIS — R0902 Hypoxemia: Secondary | ICD-10-CM | POA: Diagnosis not present

## 2021-01-11 DIAGNOSIS — J9601 Acute respiratory failure with hypoxia: Secondary | ICD-10-CM | POA: Diagnosis not present

## 2021-01-11 DIAGNOSIS — E43 Unspecified severe protein-calorie malnutrition: Secondary | ICD-10-CM | POA: Diagnosis not present

## 2021-01-11 LAB — CBC
HCT: 25.8 % — ABNORMAL LOW (ref 39.0–52.0)
Hemoglobin: 8.2 g/dL — ABNORMAL LOW (ref 13.0–17.0)
MCH: 30.7 pg (ref 26.0–34.0)
MCHC: 31.8 g/dL (ref 30.0–36.0)
MCV: 96.6 fL (ref 80.0–100.0)
Platelets: 338 10*3/uL (ref 150–400)
RBC: 2.67 MIL/uL — ABNORMAL LOW (ref 4.22–5.81)
RDW: 19.9 % — ABNORMAL HIGH (ref 11.5–15.5)
WBC: 7.7 10*3/uL (ref 4.0–10.5)
nRBC: 0.3 % — ABNORMAL HIGH (ref 0.0–0.2)

## 2021-01-11 LAB — PROCALCITONIN: Procalcitonin: 0.4 ng/mL

## 2021-01-11 LAB — COMPREHENSIVE METABOLIC PANEL
ALT: 35 U/L (ref 0–44)
AST: 45 U/L — ABNORMAL HIGH (ref 15–41)
Albumin: 1.7 g/dL — ABNORMAL LOW (ref 3.5–5.0)
Alkaline Phosphatase: 138 U/L — ABNORMAL HIGH (ref 38–126)
Anion gap: 10 (ref 5–15)
BUN: 93 mg/dL — ABNORMAL HIGH (ref 8–23)
CO2: 23 mmol/L (ref 22–32)
Calcium: 8.5 mg/dL — ABNORMAL LOW (ref 8.9–10.3)
Chloride: 99 mmol/L (ref 98–111)
Creatinine, Ser: 1.3 mg/dL — ABNORMAL HIGH (ref 0.61–1.24)
GFR, Estimated: 58 mL/min — ABNORMAL LOW (ref >60)
Glucose, Bld: 98 mg/dL (ref 70–99)
Potassium: 4.5 mmol/L (ref 3.5–5.1)
Sodium: 132 mmol/L — ABNORMAL LOW (ref 135–145)
Total Bilirubin: 0.8 mg/dL (ref 0.3–1.2)
Total Protein: 6.9 g/dL (ref 6.5–8.1)

## 2021-01-11 LAB — GLUCOSE, CAPILLARY
Glucose-Capillary: 82 mg/dL (ref 70–99)
Glucose-Capillary: 84 mg/dL (ref 70–99)
Glucose-Capillary: 85 mg/dL (ref 70–99)
Glucose-Capillary: 87 mg/dL (ref 70–99)
Glucose-Capillary: 97 mg/dL (ref 70–99)
Glucose-Capillary: 97 mg/dL (ref 70–99)

## 2021-01-11 MED ORDER — LACTATED RINGERS IV SOLN: INTRAVENOUS | Status: AC

## 2021-01-11 NOTE — Plan of Care (Signed)
°  Problem: Coping: Goal: Level of anxiety will decrease Outcome: Progressing   Problem: Pain Managment: Goal: General experience of comfort will improve Outcome: Progressing   Problem: Health Behavior/Discharge Planning: Goal: Ability to manage health-related needs will improve Outcome: Not Progressing   Problem: Nutrition: Goal: Adequate nutrition will be maintained Outcome: Not Progressing

## 2021-01-11 NOTE — Progress Notes (Signed)
NAME:  Ronald Wolf, MRN:  [id number], DOB:  March 25, 1946, LOS: 83 ADMISSION DATE:  11/14/2020, CONSULTATION DATE:  12/7 REFERRING MD:  Ronald Wolf, CHIEF COMPLAINT:  Abdominal pain   Brief History:  Ronald Wolf is a 75 yo M w/ PMH of CVA, Seizures, Dementia, Bladder ca s/p urostomy, tobacco use, right glottic carcinoma s/p excision presenting with sbo. Found to have ischemic bowel requiring ex-lap 12/7.  Course complicated by septic shock, acute respiratory failure and AKI return to the OR 12/12 for necrotic small bowel resection of additional 70 cm inclusive of prior anastomosis 12/15 abdominal closure with ileostomy He has had a prolonged ICU stay with ventilator dependence, had a tracheostomy on 12/29 Remains in ICU for ventilator dependence and arrhythmias  Past Medical History:  CVA, Dementia, Bladder CA s/p nephrostomy, tobacco use, right glottic carcinoma s/p excision, seizures.  Significant Hospital Events:  11/28/2020 Admission 11/16/2020 OR 11/16/2020 Admit to ICU 12/10 Brief SVT with associated hypertension. Self limited Given fentanyl, metop and hydral for HTN 12/11 Good urine output with Lasix , failed extubation, reintubated within 2 hours due to inability to clear secretions 12/12 return OR for enteric leak-- found to have bowel necrosis. Left in discontinuity  12/13 self extubated, reintubated. On pressors. Family meeting with PCCM and CCS to discuss possible next steps.  12/15-was back in OR, abdominal closure, RUQ mucus fistula and L ileostomy  12/19: No significant overnight events, high ileostomy output 12/21: scheduled for family meeting. Worse hypernatremia, continued high ileostomy output.  Attempted PSV/CPAP wean, pulled volumes of high 100s -low/mid 200s with tachypnea. Placed back of full support.  12/22 ongoing high output GI/ ileostomy, TPN started; weaned 12/5 all day; off fentanyl gtt, remains on precedex  12/24 IR drain , 1 unit PRBC transfusion for hemoglobin 6.5 12/26 added  vancomycin for GPC in blood cultures and RIC suggesting MRSA 12/29 tracheostomy 12/31 Palliative discussion > family understanding that his situation is critical and he has not made much improvement  1/6 ongoing palliative conversations. No progress from pt standpoint. 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  1/11 seen by surgery, IR consulted to remove percutaneous drain.  Added octreotide in effort to try to decrease output 1/12: More fatigued back on vent 1/14: Opens eyes to voice and grimaces to pain, ?tracking. Severely deconditioned and suspect long recovery requiring vent dependence at this point. Reconsult PT for developing contractures 1/15: Tolerated trach collar 1/16: Tolerated trach collar, improved alertness 1/17: Overnight had bradycardia. This morning 3 episodes of asystole <30-60s reported per nursing 1/20: visible discomfort, started on hydromorphone  1/21: family wishes for ongoing aggressive care and target destination care facility -- refused by [facility] due to poor prognosis. Prolongation of dying process again explained to family  1/24 palliative unable to reach family. Unable to try TC due to debilitation and poor tolerance of PSV 1/26: remains vent dependent -- intolerant of PSV for more than a couple hours thus unable to try TC. 1/27: palliative had multiple conversations with family.   1/28: vomited EN, EN held. WBC increase to 18 from 11. Daughter visited. Saw abdomen and was very emotional. 1/29 pt more lethargic. Family states ready for comfort care.  TPN stopped per conversation with daughter. Later, decision for comfort deferred-- family asks for Monday  1/31  1/30 patient more lethargic, tearful.  Consults:  General Surgery PCCM Palliative Care  Procedures:  2020/11/27 Ex-lap, G-tube placement 12/7 PICC >> ETT 12/7 >> 12/11, >> 12/15- back to OR for abdominal closure, exploratory laparotomy  12/24 CT-guided right pelvic abscess drain 12/29 trach  Significant Diagnostic Tests:  CT abd/pelvis 12/2 > High-grade distal small bowel obstruction, possibly due to adhesion. Mild ascites and diffuse mesenteric edema. No evidence of focal inflammatory process or abscess. Small bilateral pleural effusions and mild right lower lobe Atelectasis. CT abdomen/pelvis 12/23 Interval development of a large loculated collection in the low anterior abdomen and pelvis measuring approximately 6.1 x 11.3 x12.1 cm in size subjacent to the operative site. Few foci of internal gas are present. , no frank spillage of enteric contrast  CT A/P 1/9 Success drainage of fluid of pelvic fluid collection, bilateral pleural effusions  CT H 1/6 > no acute intracranial abnormality. Chronic L MCA changes from prior L MCA infarct. Atrophic and chronic white matter ischemic changes.  Stable L basal ganglia infarcts  CT abd pelvis 1/10: pelvis abscess drained. BLL effusions w/ atx. Expected post op changes    Micro Data:  12/07/2020 COVID/Flu negative 12/7 BA: Few GNR rare GPC -- predominantly PMN  12/7 BCx> Neg 12/11 respiratory -no organism in tracheal aspirate 12/24 BC >> RIC showing both staph epidermis 1/4 12/24 abscess >> E. Faecalis, B fragilis (B lactamase) 12/27 trach aspirate>> normal flora 1/8  Trach Achromobacter xylosoxidans and E.coli 1/8 BCX> Neg  Antimicrobials:  Fluc 12/8>> 12/10 Zosyn 12/8> 12/12 cefotetain 12/15>> off Zosyn 12/23 >> 12/30 eraxis 12/24 >> 1/2 Vancomycin 12/26 >>12/31 Unasyn 12/31 >1/9 zosyn 1/9>> vanc 1/9 >>off  Interim History / Subjective:  Please see documentation of family conversations 1/29.  Overnight, NAEO. Patient was  bathed and reportedly experienced significant pain. Sacral wound not improving.  New AKI 1/30   This morning there is odor of decay.  Patient is withdrawn, and very weak seeming.  Initially very avoidant of eye contact and then is tearful. Unable to lift hand to wipe his own tears. Appears to be in pain throughout physical exam.  Emotional support provided, shared family discussions with the patient prompting brow furrowing and continued tearfulness.  Asked pt to squeeze hand if he wanted prayer (historically has been source of comfort for pt this admission) to which he weakly repeatedly moved thumb and made eye contact. I said ok, he closed his eyes and I prayed for the patient.   Objective   Blood pressure 119/64, pulse 72, temperature 98.6 F (37 C), temperature source Axillary, resp. rate 14, height 5\' 11"  (1.803 m), weight 66.1 kg, SpO2 100 %.    Vent Mode: PRVC FiO2 (%):  [40 %] 40 % Set Rate:  [14 bmp] 14 bmp Vt Set:  [600 mL] 600 mL PEEP:  [5 cmH20] 5 cmH20 Plateau Pressure:  [20 cmH20-30 cmH20] 30 cmH20   Intake/Output Summary (Last 24 hours) at 01/11/2021 0746 Last data filed at 01/11/2021 0700 Gross per 24 hour  Intake 1750.28 ml  Output 3002 ml  Net -1251.72 ml   Filed Weights   01/08/21 0345 01/09/21 0600 01/10/21 0300  Weight: 66.7 kg 67.1 kg 66.1 kg   General:  Debilitated, frail, older adult M, reclined in bed, trach vent. Malodorous  HEENT: bilateral muscle wasting. Will not open mouth to command. Trachea midline. Trach secure, thick  tan secretions. Tracheal wound non-healing Neuro: Lethargic, alert. Weakly tried to squeeze hand when asked to squeeze hand if "Yes" in response to question, otherwise does not follow commands. Grimaces, furrows brow and produces tears when stimuli is uncomfortable.  Psych: Flat affect and withdrawn. Avoidant eye contact. Tearful  CV: tachy regular. No rgm  PULM:  Basilar crackles. Symmetrical chest expansion. Low Vt on PSV -- on  PRVC again.  GI: tender to light palpation. Midline wound non-healing.  GU: penile prosthesis. Urostomy  Extremities:  BUE BLE muscle wasting. No obvious joint deformity. No cyanosis or clubbing.  LUE PICC BLE boots  Skin: sacral wound non-healing, tracheal wound non-healing. Surgical wound non-healing. Skin is c/d/w.   Resolved Hospital Problem list   HCAP  Ecoli and achromobacter xylosoxidans s/p >5d Zosyn shock  Assessment & Plan:    GOC: -GOC have been ongoing for large portion of critical illness stay, involving PCCM CCS and PCM  -professional opinion of multiple providers that prognosis for recovery is poor for a while -- but all aggressive and invasive interventions have been attempted and sustained per family wishes. Despite best efforts, these interventions have not been successful in producing meaningful improvement.  -Current interventions are causing harm/pain without providing reasonable benefit for the patient's prognosis. I believe he is suffering.  -Family (daughter [name] and sister Clarene Critchley) also believe he is suffering and will not recover, however decisions for comfort care continue to waiver.  -I believe we have reached a point of medical futility as current measures pertain to pts prognosis. Again, maintaining current interventions are causing pain without yielding benefit for recovery-- and this is prolonging suffering for the patient.  - If a decision for comfort care cannot be reached an ethics consult is appropriate  SBO, Ischemic Bowel -s/p ex lap with SBR and G tube 12/7  -s/p takeback 12/12 with necrotic SBR. 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  -Resuming and continuing TPN will not provide meaningful recovery for this patient should decision for comfort care plan be delayed beyond 1/31.  -If hypoglycemic can start dextrose   Acute hypoxic respiratory failure requiring tracheostomy VDRF Intermittent trachea bleeding due to suction trauma Tracheal pressure wound, non-healing  P -Vt poor 1/30, back on PRVC -VAP, pulm hygiene.  -routine trach care and wound care  Acute encephalopathy, multifactorial -Critical illness, CNS depressing medications, profound debility, uremia, prior septic shock Pain P -hydromorphone gtt and bolus for pain management   AKI -trend renal indices and electrolytes  -FWF are continued -26ml LR x 5hr   HTN Afib RVR, improved Tachybrady syndrome, improved S/p cardiac arrest this admission with ROSC P -PRN hydral for HTN.   Leukocytosis, improving  -(18.8 on 1/28 -- down to 7.7 1/30) Anemia, stable P -defer abx initiation -cont to trend    Hx L MCA CVA Hx seizures, well controlled  Hx bladder cancer P -Keppra -- continue this when transitions to comfort care -urostomy care     Best practice:  Diet: none Pain/Anxiety/Delirium protocol (if indicated): dilaudid gtt VAP protocol (if indicated): in place DVT prophylaxis: heparin GI prophylaxis: ppi Glucose control: ssi Mobility: br Code Status: vent okay, no compressions/shocks Family Communication: pending 1/30 Disposition: ICU  Goals of Care:  1/29 family indicated plan for transition to comfort care 1/31.  Palliative continues to follow  Rec to consider ethics consult if comfort care transition is deferred   CRITICAL CARE Performed by:  Cristal Generous   Total critical care time: 50 minutes  Critical care time was exclusive of separately billable procedures and treating other patients.  Critical care was necessary to treat or prevent imminent or life-threatening deterioration.  Critical care was time spent personally by me on the following activities: development of treatment plan with patient and/or surrogate as well as nursing, discussions with consultants, evaluation of patient's response to treatment, examination of patient, obtaining history from patient or surrogate, ordering and performing treatments and interventions, ordering and review of laboratory studies, ordering and review of radiographic studies, pulse oximetry and re-evaluation of patient's condition.

## 2021-01-11 NOTE — Progress Notes (Signed)
Patient ID: Ronald Wolf, male   DOB: 1946/01/03, 75 y.o.   MRN: [id number]   The patient continues to show no signs of improvement.  He does not have enough remaining viable small bowel to sustain nutrition enough for survival, so he will always be dependent on TNA.  Still requiring full ventilator support with no apparent improvement in his pulmonary status.  The patient has been turned down for [facility] because of poor long-term prognosis.  Palliative care has been involved with family discussions for some time.  It is my professional medical opinion that this patient has shown virtually no signs of improvement in the last 6-8 weeks, despite maximal therapy.  There are no indications for any further surgical intervention.  Continued interventions are likely causing unnecessary pain and discomfort for the patient.  We have complied with the family's wishes for maximal support for these weeks, but there has been no improvement.   Would agree with Ethic Consult as this patient has a very poor prognosis for survival and recovery.  Imogene Burn. Georgette Dover, MD, [facility]  General/ Trauma Surgery   01/11/2021 10:01 AM

## 2021-01-11 NOTE — Progress Notes (Addendum)
Patient ID: Ronald Wolf, male   DOB: [birth year], 75 y.o.   MRN: [id number]  Medical records reviewed.    This NP visited patient at the bedside as a follow up for palliative medicine needs and emotional support and at the request of the attending for ongoing conversation and education with family.  Patient continues to fail to thrive in spite of full medical support.  He is unable to wean from the ventilator, surgical team offers no viable option for nutritional support.  His prognosis is poor  Spoke with daughter [name] by telephone. Education offered on current medical situation.  Daughter verbalizes an understanding of the seriousness of her father's current medical situation.   Family has made decision to liberate patient from the ventilator and shift to a full comfort path tomorrow at 3:00 pm .    I will f/u with daughter in the morning.   Family have been cleared to visit under comfort visitation policy.  However family wish current level of medical interventions to be continued until tomorrow.   Education regarding the natural trajectory and expectations at end-of-life was offered, and the process of liberating patient from the ventilator.  Questions and concerns addressed   Discussed with  CCM via secure chat  Total time spent on the unit was 35 minutes  Greater than 50% of the time was spent in counseling and coordination of care  Wadie Lessen NP  Palliative Medicine Team Team Phone # [phone] Pager [phone]

## 2021-01-12 DIAGNOSIS — F419 Anxiety disorder, unspecified: Secondary | ICD-10-CM

## 2021-01-12 DIAGNOSIS — K56609 Unspecified intestinal obstruction, unspecified as to partial versus complete obstruction: Secondary | ICD-10-CM | POA: Diagnosis not present

## 2021-01-12 DIAGNOSIS — J9610 Chronic respiratory failure, unspecified whether with hypoxia or hypercapnia: Secondary | ICD-10-CM | POA: Diagnosis not present

## 2021-01-12 DIAGNOSIS — Z515 Encounter for palliative care: Secondary | ICD-10-CM | POA: Diagnosis not present

## 2021-01-12 DIAGNOSIS — Z7189 Other specified counseling: Secondary | ICD-10-CM | POA: Diagnosis not present

## 2021-01-12 LAB — PHOSPHORUS: Phosphorus: 4.9 mg/dL — ABNORMAL HIGH (ref 2.5–4.6)

## 2021-01-12 LAB — GLUCOSE, CAPILLARY
Glucose-Capillary: 100 mg/dL — ABNORMAL HIGH (ref 70–99)
Glucose-Capillary: 90 mg/dL (ref 70–99)
Glucose-Capillary: 92 mg/dL (ref 70–99)

## 2021-01-12 LAB — PREALBUMIN: Prealbumin: 21 mg/dL (ref 18–38)

## 2021-01-12 LAB — TRIGLYCERIDES: Triglycerides: 122 mg/dL (ref <150)

## 2021-01-12 LAB — MAGNESIUM: Magnesium: 2.2 mg/dL (ref 1.7–2.4)

## 2021-01-12 MED ORDER — LORAZEPAM 2 MG/ML IJ SOLN
1.0000 mg | INTRAMUSCULAR | Status: DC | PRN
  Administered 2021-01-14: 1 mg via INTRAVENOUS
  Filled 2021-01-12: qty 1

## 2021-01-12 MED ORDER — HYDROMORPHONE HCL 1 MG/ML IJ SOLN
2.0000 mg | INTRAMUSCULAR | Status: DC | PRN
  Administered 2021-01-13 – 2021-01-14 (×2): 2 mg via INTRAVENOUS

## 2021-01-12 MED ORDER — HYDROMORPHONE BOLUS VIA INFUSION
1.0000 mg | INTRAVENOUS | Status: DC | PRN
  Filled 2021-01-12: qty 1

## 2021-01-12 MED ORDER — GLYCOPYRROLATE 0.2 MG/ML IJ SOLN
0.2000 mg | INTRAMUSCULAR | Status: DC
  Administered 2021-01-12: 0.2 mg via INTRAVENOUS
  Filled 2021-01-12: qty 1

## 2021-01-12 MED ORDER — GLYCOPYRROLATE 0.2 MG/ML IJ SOLN
0.4000 mg | INTRAMUSCULAR | Status: DC
  Administered 2021-01-12 – 2021-01-19 (×40): 0.4 mg via INTRAVENOUS
  Filled 2021-01-12 (×45): qty 2

## 2021-01-12 NOTE — Progress Notes (Signed)
Palliative Medicine RN Note: Rec'd request from PMT NP [facility] to page chaplain. Pt has been extubated and family at bedside asking for support. I page the [facility] chaplain with that message.  Marjie Skiff Meloney Feld, RN, BSN, [facility] Palliative Medicine Team 01/12/2021 3:06 PM Office [phone]

## 2021-01-12 NOTE — Progress Notes (Addendum)
Patient ID: Ronald Wolf, male   DOB: [birth year], 75 y.o.   MRN: [id number]  Medical records reviewed.    This NP met family at bedside as scheduled to liberate Ronald Wolf from the ventilator, shift to a comfort path  And allow a natural death. Education regarding the natural trajectory and expectations at end-of-life was offered.  Education offered on the process of liberating Ronald Wolf from the ventilator  Family members began visiting yesterday under the comfort visitation policy.  Family at bedside are patient's daughter/Ronald Wolf, 2 of the patient's siblings, sister-in-law and a nephew.  All questions and concerns were addressed to the best of my ability.  Plan of care -DNR/DNI -No further vent support, trach to room air -No artificial feeding or hydration now or in the future -No further diagnostics, lab draws -Symptom management     - Dilaudid drip currently in place, titration and bolus orders updated     - Robinul for terminal secretions     - Ativan for agitation/anxiety -Emotional support offered -Chaplain services triggered -Prognosis is likely hours to days,  expect a hospital death  Questions and concerns addressed   Discussed with  CCM via secure chat and bedside nursing  Total time spent on the unit was 45 minutes  Greater than 50% of the time was spent in counseling and coordination of care  Ronald Lessen NP  Palliative Medicine Team Team Phone # [phone] Pager [phone]

## 2021-01-12 NOTE — Progress Notes (Signed)
PT Cancellation Note  Patient Details Name: Ronald Wolf MRN: [id number] DOB: 1946-12-05   Cancelled Treatment:    Reason Eval/Treat Not Completed: PT screened, no needs identified, will sign off - Pt transitioning to comfort care at 15:00 today, per palliative notes. PT to sign off, please reconsult as needed.  Stacie Glaze, PT Acute Rehabilitation Services Pager [phone]  Office [phone]    Louis Matte 01/12/2021, 9:23 AM

## 2021-01-12 NOTE — Progress Notes (Signed)
47 Days Post-Op   Subjective/Chief Complaint: No change. On vent. Eyes open   Objective: Vital signs in last 24 hours: Temp:  [97.9 F (36.6 C)-99 F (37.2 C)] 98.5 F (36.9 C) (01/31 0345) Pulse Rate:  [63-93] 93 (01/31 0645) Resp:  [13-28] 19 (01/31 0645) BP: (95-159)/(55-90) 136/72 (01/31 0645) SpO2:  [91 %-100 %] 91 % (01/31 0645) FiO2 (%):  [40 %] 40 % (01/31 0423) Weight:  [63.1 kg] 63.1 kg (01/31 0000) Last BM Date: 01/10/21  Intake/Output from previous day: 01/30 0701 - 01/31 0700 In: 469.8 [I.V.:469.8] Out: 3100 [Urine:1845; Stool:1250] Intake/Output this shift: No intake/output data recorded.  HEENT: trach in place. On vent ENI:DPOE, ND, NT,openmidline woundwith some fibrinous exudate and some tissue necrosis but fascia intact/ sutures visible,RUQstoma(mucus fistula)viable withscanttanoutput, LLQstoma(end ileostomy)pinkwithbilious appearing output, urostomy productive, g-tubesiteclean Neuro:opens eyes but does not respond orfollow commands    Lab Results:  Recent Labs    01/10/21 0344 01/11/21 0309  WBC 12.6* 7.7  HGB 7.9* 8.2*  HCT 25.9* 25.8*  PLT 290 338   BMET Recent Labs    01/10/21 0344 01/11/21 0309  NA 130* 132*  K 3.8 4.5  CL 98 99  CO2 24 23  GLUCOSE 102* 98  BUN 93* 93*  CREATININE 1.07 1.30*  CALCIUM 8.3* 8.5*   PT/INR No results for input(s): LABPROT, INR in the last 72 hours. ABG No results for input(s): PHART, HCO3 in the last 72 hours.  Invalid input(s): PCO2, PO2  Studies/Results: No results found.  Anti-infectives: Anti-infectives (From admission, onward)   Start     Dose/Rate Route Frequency Ordered Stop   12/21/20 1130  vancomycin (VANCOREADY) IVPB 1250 mg/250 mL  Status:  Discontinued        1,250 mg 166.7 mL/hr over 90 Minutes Intravenous Every 48 hours 12/21/20 1032 12/22/20 0848   12/21/20 1115  piperacillin-tazobactam (ZOSYN) IVPB 3.375 g        3.375 g 12.5 mL/hr over 240 Minutes  Intravenous Every 8 hours 12/21/20 1022 12/31/20 0030   12/12/20 1800  Ampicillin-Sulbactam (UNASYN) 3 g in sodium chloride 0.9 % 100 mL IVPB  Status:  Discontinued        3 g 200 mL/hr over 30 Minutes Intravenous Every 6 hours 12/12/20 1101 12/21/20 1022   12/08/20 1300  vancomycin (VANCOCIN) IVPB 1000 mg/200 mL premix  Status:  Discontinued        1,000 mg 200 mL/hr over 60 Minutes Intravenous Every 24 hours 12/08/20 0845 12/12/20 1059   12/08/20 0100  vancomycin (VANCOREADY) IVPB 500 mg/100 mL  Status:  Discontinued        500 mg 100 mL/hr over 60 Minutes Intravenous Every 12 hours 12/07/20 1130 12/07/20 1337   12/07/20 1215  vancomycin (VANCOREADY) IVPB 1250 mg/250 mL        1,250 mg 166.7 mL/hr over 90 Minutes Intravenous  Once 12/07/20 1124 12/07/20 1336   12/06/20 1030  anidulafungin (ERAXIS) 100 mg in sodium chloride 0.9 % 100 mL IVPB  Status:  Discontinued       "Followed by" Linked Group Details   100 mg 78 mL/hr over 100 Minutes Intravenous Every 24 hours 12/05/20 0939 12/13/20 1230   12/05/20 1030  anidulafungin (ERAXIS) 200 mg in sodium chloride 0.9 % 200 mL IVPB       "Followed by" Linked Group Details   200 mg 78 mL/hr over 200 Minutes Intravenous  Once 12/05/20 0939 12/05/20 1618   12/04/20 1500  piperacillin-tazobactam (ZOSYN) IVPB 3.375  g  Status:  Discontinued        3.375 g 12.5 mL/hr over 240 Minutes Intravenous Every 8 hours 12/04/20 0818 12/12/20 1059   12/04/20 0915  piperacillin-tazobactam (ZOSYN) IVPB 3.375 g        3.375 g 100 mL/hr over 30 Minutes Intravenous  Once 12/04/20 0818 12/04/20 1207   11/16/2020 0945  cefoTEtan (CEFOTAN) 2 g in sodium chloride 0.9 % 100 mL IVPB  Status:  Discontinued        2 g 200 mL/hr over 30 Minutes Intravenous To Surgery 11/14/2020 0936 12/12/2020 1057   11/22/2020 0900  cefoTEtan (CEFOTAN) 2 g in sodium chloride 0.9 % 100 mL IVPB  Status:  Discontinued        2 g 200 mL/hr over 30 Minutes Intravenous  Once 12/06/2020 0833 11/22/2020  0936   11/19/20 1130  piperacillin-tazobactam (ZOSYN) IVPB 3.375 g        3.375 g 12.5 mL/hr over 240 Minutes Intravenous Every 8 hours 11/19/20 1017 12/22/2020 2359   11/19/20 1115  fluconazole (DIFLUCAN) IVPB 200 mg        200 mg 100 mL/hr over 60 Minutes Intravenous Every 24 hours 11/19/20 1017 11/21/20 1104   11/19/20 0915  Ampicillin-Sulbactam (UNASYN) 3 g in sodium chloride 0.9 % 100 mL IVPB  Status:  Discontinued        3 g 200 mL/hr over 30 Minutes Intravenous Every 12 hours 11/19/20 0826 11/19/20 0949      Assessment/Plan: SBO -s/p ex lap with SBR, placement of a gastrostomy tube12/7 by Dr. Georgette Dover. Takeback 12/12 by Dr. Zenia Resides for necrotic SB with resection of additional 60-70cm of SB inclusive of prior anastomosis in discontinuity with open abdomen. - S/P ex lap, ileostomy, mucous fistula, closure 12/15 by Dr. Grandville Silos -continue wound care  Malnourishment/FEN-Continue TNA. Continue TF if desired, although absorption certainly not enough to support him it appearsContinue fiber,imodium, lomotil, tincture of opium. VDRF-s/p tracheostomy 12/29 ID- zosyn1/9>> (for E coli and Achromobacter xylosoxidans in Rcx, CCM also plans to continue this 5 days after IR drain removal 1/13).vancomycin 1/9>>1/10. unasyn 12/31>>1/9. Zosyn 1/19 - 1/18 per CCM-   HE is off all abx now Intra-abdominal abscess - seen on CT 12/23,s/p perc drain by IR 12/24. Culture with Enterococcus faecalis and Bacteroides fragilis, beta lactamase positive.CT 1/9 showed no evidence of significant residual fluid collection and no new fluid collections. IRdrain injection study negative therefore drain removed 1/13 CV/AF RVR-having PVCs, several recent episodes of asystole VTE-SQH Dispo - Patient was refused by [facility] due to poor long-term prognosis. Palliative care is involved. We are available to support with family discussions as well. Will see again later this week, please call if needed before  then  Rolm Bookbinder 01/12/2021

## 2021-01-12 NOTE — Progress Notes (Signed)
   01/12/21 1520  Clinical Encounter Type  Visited With Patient and family together  Visit Type Death  Referral From Nurse  Consult/Referral To Chaplain  Chaplain responded to page. The patient's daughter [name], and her mother and the patient's brother were at bedside. Chaplain offered ministry of presence, comfort and prayer with the family. Chaplain gave the daughter the Patient Placement card. This note was prepared by Jeanine Luz, M.Div..  For questions please contact by phone [phone].

## 2021-01-12 NOTE — Progress Notes (Signed)
Vent turned off as patient transitioned to comfort care.  RN and family at bedside.

## 2021-01-12 NOTE — Progress Notes (Signed)
NAME:  RAYNELL SCOTT, MRN:  [id number], DOB:  Apr 04, 1946, LOS: 5 ADMISSION DATE:  11/25/2020, CONSULTATION DATE:  12/7 REFERRING MD:  Cyndia Skeeters, CHIEF COMPLAINT:  Abdominal pain   Brief History:  Mr.Akhavan is a 75 yo M w/ PMH of CVA, Seizures, Dementia, Bladder ca s/p urostomy, tobacco use, right glottic carcinoma s/p excision presenting with sbo. Found to have ischemic bowel requiring ex-lap 12/7.  Course complicated by septic shock, acute respiratory failure and AKI return to the OR 12/12 for necrotic small bowel resection of additional 70 cm inclusive of prior anastomosis 12/15 abdominal closure with ileostomy He has had a prolonged ICU stay with ventilator dependence, had a tracheostomy on 12/29 Remains in ICU for ventilator dependence and arrhythmias  Past Medical History:  CVA, Dementia, Bladder CA s/p nephrostomy, tobacco use, right glottic carcinoma s/p excision, seizures.  Significant Hospital Events:  12/02/2020 Admission 11/14/2020 OR 11/14/2020 Admit to ICU 12/10 Brief SVT with associated hypertension. Self limited Given fentanyl, metop and hydral for HTN 12/11 Good urine output with Lasix , failed extubation, reintubated within 2 hours due to inability to clear secretions 12/12 return OR for enteric leak-- found to have bowel necrosis. Left in discontinuity  12/13 self extubated, reintubated. On pressors. Family meeting with PCCM and CCS to discuss possible next steps.  12/15-was back in OR, abdominal closure, RUQ mucus fistula and L ileostomy  12/19: No significant overnight events, high ileostomy output 12/21: scheduled for family meeting. Worse hypernatremia, continued high ileostomy output.  Attempted PSV/CPAP wean, pulled volumes of high 100s -low/mid 200s with tachypnea. Placed back of full support.  12/22 ongoing high output GI/ ileostomy, TPN started; weaned 12/5 all day; off fentanyl gtt, remains on precedex  12/24 IR drain , 1 unit PRBC transfusion for hemoglobin 6.5 12/26 added  vancomycin for GPC in blood cultures and RIC suggesting MRSA 12/29 tracheostomy 12/31 Palliative discussion > family understanding that his situation is critical and he has not made much improvement  1/6 ongoing palliative conversations. No progress from pt standpoint. 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  1/11 seen by surgery, IR consulted to remove percutaneous drain.  Added octreotide in effort to try to decrease output 1/12: More fatigued back on vent 1/14: Opens eyes to voice and grimaces to pain, ?tracking. Severely deconditioned and suspect long recovery requiring vent dependence at this point. Reconsult PT for developing contractures 1/15: Tolerated trach collar 1/16: Tolerated trach collar, improved alertness 1/17: Overnight had bradycardia. This morning 3 episodes of asystole <30-60s reported per nursing 1/20: visible discomfort, started on hydromorphone  1/21: family wishes for ongoing aggressive care and target destination care facility -- refused by [facility] due to poor prognosis. Prolongation of dying process again explained to family  1/24 palliative unable to reach family. Unable to try TC due to debilitation and poor tolerance of PSV 1/26: remains vent dependent -- intolerant of PSV for more than a couple hours thus unable to try TC. 1/27: palliative had multiple conversations with family.   1/28: vomited EN, EN held. WBC increase to 18 from 11. Daughter visited. Saw abdomen and was very emotional. 1/29 pt more lethargic.  Consults:  General Surgery PCCM  Palliative Care  Procedures:  12-17-20 Ex-lap, G-tube placement 12/7 PICC >> ETT 12/7 >> 12/11,  >> 12/15- back to OR for abdominal closure, exploratory laparotomy  12/24 CT-guided right pelvic abscess drain 12/29 trach  Significant Diagnostic Tests:  CT abd/pelvis 12/2 > High-grade distal small bowel obstruction, possibly due to adhesion. Mild ascites and diffuse mesenteric edema. No evidence of focal inflammatory process or abscess. Small bilateral pleural effusions and mild right lower lobe Atelectasis. CT abdomen/pelvis 12/23 Interval development of a large loculated collection in the low anterior abdomen and pelvis measuring approximately 6.1 x 11.3 x12.1 cm in size subjacent to the operative site. Few foci of internal gas are present. , no frank spillage of enteric contrast  CT A/P 1/9 Success drainage of fluid of pelvic fluid collection, bilateral pleural effusions  CT H 1/6 > no acute intracranial abnormality. Chronic L MCA changes from prior L MCA infarct. Atrophic and chronic white matter ischemic changes.  Stable L basal ganglia infarcts  CT abd pelvis 1/10: pelvis abscess drained. BLL effusions w/ atx. Expected post op changes    Micro Data:  11/20/2020 COVID/Flu negative 12/7 BA: Few GNR rare GPC -- predominantly PMN  12/7 BCx> Neg 12/11 respiratory -no organism in tracheal aspirate 12/24 BC >> RIC showing both staph epidermis 1/4 12/24 abscess >> E. Faecalis, B fragilis (B lactamase) 12/27 trach aspirate>> normal flora 1/8  Trach Achromobacter xylosoxidans and E.coli 1/8 BCX> Neg  Antimicrobials:  Fluc 12/8>> 12/10 Zosyn 12/8> 12/12 cefotetain 12/15>> off Zosyn 12/23 >> 12/30 eraxis 12/24 >> 1/2 Vancomycin 12/26 >>12/31 Unasyn 12/31 >1/9 zosyn 1/9>> vanc 1/9 >>off  Interim History / Subjective:  No events. Remains on vent.  Objective   Blood pressure 135/77, pulse 95, temperature 98 F (36.7 C), temperature source Oral, resp. rate (!) 25, height 5\' 11"  (1.803 m), weight 63.1 kg, SpO2 96 %.    Vent Mode: PRVC FiO2 (%):  [40 %] 40 % Set Rate:  [14 bmp] 14  bmp Vt Set:  [600 mL] 600 mL PEEP:  [5 cmH20] 5 cmH20 Plateau Pressure:  [18 cmH20-24 cmH20] 18 cmH20   Intake/Output Summary (Last 24 hours) at 01/12/2021 0855 Last data filed at 01/12/2021 0600 Gross per 24 hour  Intake 469.76 ml  Output 3000 ml  Net -2530.24 ml   Filed Weights   01/10/21 0300 01/11/21 0600 01/12/21 0000  Weight: 66.1 kg 65.3 kg 63.1 kg   Constitutional: frail cachetic man on vent  Eyes: pupils equal, will not track for me Ears, nose, mouth, and throat: Trach in place with copious bloody secretions, temporal wasting, MM dry Cardiovascular: RRR, +SEM, ext warm Respiratory: scattered rhonci, triggering vent Gastrointestinal: gastrostomy/urostomy/ileostomy still present Skin: No rashes, normal turgor Neurologic: contracted, not following commands or tracking Psychiatric: RASS 0  Patient Lines/Drains/Airways Status    Active Line/Drains/Airways    Name Placement date Placement time Site Days   PICC Triple Lumen 2020/12/17 PICC Left Brachial 46 cm 0 cm 12/17/2020  1755  - 55   Gastrostomy/Enterostomy Gastrostomy 18 Fr. LUQ 12-17-20  1205  LUQ  55   Colostomy RUQ 11/30/2020  1031  RUQ  47   Ileostomy Standard (end) LLQ 11/27/2020  1027  LLQ  47   Urostomy Ureterostomy right RLQ -  -  RLQ  -   Incision (Closed) Abdomen Lower;Medial -  -  - -   Tracheostomy Shiley Legacy 6 mm Cuffed 12/10/20  1500  6 mm  33   Pressure Injury 12/08/20 Coccyx  Mid Stage 2 -  Partial thickness loss of dermis presenting as a shallow open injury with a red, pink wound bed without slough. 12/08/20  2000  - 35   Pressure Injury 12/25/20 Neck Medial Stage 2 -  Partial thickness loss of dermis presenting as a shallow open injury with a red, pink wound bed without slough. under trach faceplate 12/25/20  [id number]  - 18   Pressure Injury 01/04/21 Heel Right Deep Tissue Pressure Injury - Purple or maroon localized area of discolored intact skin or blood-filled blister due to damage of underlying soft tissue from  pressure and/or shear. 01/04/21  1200  - 8   Pressure Injury 01/04/21 Heel Left Deep Tissue Pressure Injury - Purple or maroon localized area of discolored intact skin or blood-filled blister due to damage of underlying soft tissue from pressure and/or shear. 01/04/21  1200  - 8   Wound / Incision (Open or Dehisced) 01/05/21 Skin tear Buttocks Left skin tear covered with foam 01/05/21  1600  [facility] Problem list   HCAP  Ecoli and achromobacter xylosoxidans s/p >5d Zosyn shock  Assessment & Plan:    SBO, Ischemic Bowel -s/p ex lap with SBR and G tube 12/7  -s/p takeback 12/12 with necrotic SBR. 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   Hx L MCA CVA Hx seizures, well controlled  Hx bladder cancer P -Keppra  -urostomy care  End of life - Long discussions with palliative and family.  Agree patient is suffering and condition is terminal.  Tentative plans to transition to comfort care this afternoon.    Best practice:  Diet: none Pain/Anxiety/Delirium protocol (if indicated): dilaudid gtt VAP protocol (if  indicated): in place DVT prophylaxis: heparin GI prophylaxis: ppi Glucose control: ssi Mobility: br Code Status: vent okay, no compressions/shocks Family Communication: pending Disposition: ICU   Goals of Care:  -see above   Patient critically ill due to respiratory failure Interventions to address this today mechanical ventilation titration, family discussions Risk of deterioration without these interventions is high  I personally spent 36 minutes providing critical care not including any separately billable procedures  Erskine Emery MD [facility] 01/12/2021 7:23 AM Prefer epic messenger for cross cover needs If after hours, please call E-link

## 2021-01-13 DIAGNOSIS — Z515 Encounter for palliative care: Secondary | ICD-10-CM | POA: Diagnosis not present

## 2021-01-13 NOTE — Progress Notes (Signed)
NAME:  Ronald Wolf, MRN:  [id number], DOB:  06/11/1946, LOS: 44 ADMISSION DATE:  11/30/2020, CONSULTATION DATE:  12/7 REFERRING MD:  Cyndia Skeeters, CHIEF COMPLAINT:  Abdominal pain   Brief History:  Mr.Ronald Wolf is a 75 yo M w/ PMH of CVA, Seizures, Dementia, Bladder ca s/p urostomy, tobacco use, right glottic carcinoma s/p excision presenting with sbo. Found to have ischemic bowel requiring ex-lap 12/7.  Course complicated by septic shock, acute respiratory failure and AKI return to the OR 12/12 for necrotic small bowel resection of additional 70 cm inclusive of prior anastomosis 12/15 abdominal closure with ileostomy He has had a prolonged ICU stay with ventilator dependence, had a tracheostomy on 12/29 Remains in ICU for ventilator dependence and arrhythmias  Past Medical History:  CVA, Dementia, Bladder CA s/p nephrostomy, tobacco use, right glottic carcinoma s/p excision, seizures.  Significant Hospital Events:  11/28/2020 Admission 11/27/2020 OR 11/30/2020 Admit to ICU 12/10 Brief SVT with associated hypertension. Self limited Given fentanyl, metop and hydral for HTN 12/11 Good urine output with Lasix , failed extubation, reintubated within 2 hours due to inability to clear secretions 12/12 return OR for enteric leak-- found to have bowel necrosis. Left in discontinuity  12/13 self extubated, reintubated. On pressors. Family meeting with PCCM and CCS to discuss possible next steps.  12/15-was back in OR, abdominal closure, RUQ mucus fistula and L ileostomy  12/19: No significant overnight events, high ileostomy output 12/21: scheduled for family meeting. Worse hypernatremia, continued high ileostomy output.  Attempted PSV/CPAP wean, pulled volumes of high 100s -low/mid 200s with tachypnea. Placed back of full support.  12/22 ongoing high output GI/ ileostomy, TPN started; weaned 12/5 all day; off fentanyl gtt, remains on precedex  12/24 IR drain , 1 unit PRBC transfusion for hemoglobin 6.5 12/26 added  vancomycin for GPC in blood cultures and RIC suggesting MRSA 12/29 tracheostomy 12/31 Palliative discussion > family understanding that his situation is critical and he has not made much improvement  1/6 ongoing palliative conversations. No progress from pt standpoint. 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  1/11 seen by surgery, IR consulted to remove percutaneous drain.  Added octreotide in effort to try to decrease output 1/12: More fatigued back on vent 1/14: Opens eyes to voice and grimaces to pain, ?tracking. Severely deconditioned and suspect long recovery requiring vent dependence at this point. Reconsult PT for developing contractures 1/15: Tolerated trach collar 1/16: Tolerated trach collar, improved alertness 1/17: Overnight had bradycardia. This morning 3 episodes of asystole <30-60s reported per nursing 1/20: visible discomfort, started on hydromorphone  1/21: family wishes for ongoing aggressive care and target destination care facility -- refused by [facility] due to poor prognosis. Prolongation of dying process again explained to family  1/24 palliative unable to reach family. Unable to try TC due to debilitation and poor tolerance of PSV 1/26: remains vent dependent -- intolerant of PSV for more than a couple hours thus unable to try TC. 1/27: palliative had multiple conversations with family.   1/28: vomited EN, EN held. WBC increase to 18 from 11. Daughter visited. Saw abdomen and was very emotional. 1/29 pt more lethargic.  1/31 [place] discussion with PMT  resulted in compassionate extubation and comfort care.   Consults:  General Surgery PCCM Palliative  Care  Procedures:  12/12/2020 Ex-lap, G-tube placement 12/7 PICC >> ETT 12/7 >> 12/11, >>1/31 12/15- back to OR for abdominal closure, exploratory laparotomy  12/24 CT-guided right pelvic abscess drain 12/29 trach  Significant Diagnostic Tests:  CT abd/pelvis 12/2 > High-grade distal small bowel obstruction, possibly due to adhesion. Mild ascites and diffuse mesenteric edema. No evidence of focal inflammatory process or abscess. Small bilateral pleural effusions and mild right lower lobe Atelectasis. CT abdomen/pelvis 12/23 Interval development of a large loculated collection in the low anterior abdomen and pelvis measuring approximately 6.1 x 11.3 x12.1 cm in size subjacent to the operative site. Few foci of internal gas are present. , no frank spillage of enteric contrast  CT A/P 1/9 Success drainage of fluid of pelvic fluid collection, bilateral pleural effusions  CT H 1/6 > no acute intracranial abnormality. Chronic L MCA changes from prior L MCA infarct. Atrophic and chronic white matter ischemic changes.  Stable L basal ganglia infarcts  CT abd pelvis 1/10: pelvis abscess drained. BLL effusions w/ atx. Expected post op changes    Micro Data:  11/14/2020 COVID/Flu negative 12/7 BA: Few GNR rare GPC -- predominantly PMN  12/7 BCx> Neg 12/11 respiratory -no organism in tracheal aspirate 12/24 BC >> RIC showing both staph epidermis 1/4 12/24 abscess >> E. Faecalis, B fragilis (B lactamase) 12/27 trach aspirate>> normal flora 1/8  Trach Achromobacter xylosoxidans and E.coli 1/8 BCX> Neg  Antimicrobials:  Fluc 12/8>> 12/10 Zosyn 12/8> 12/12 cefotetain 12/15>> off Zosyn 12/23 >> 12/30 eraxis 12/24 >> 1/2 Vancomycin 12/26 >>12/31 Unasyn 12/31 >1/9 zosyn 1/9>> vanc 1/9 >>off  Interim History / Subjective:  Compassionate extubation yesterday after discussion with PMT and family.   Objective   Blood pressure 136/71, pulse (!) 104, temperature 98.4 F (36.9 C), temperature source  Oral, resp. rate (!) 22, height 5\' 11"  (1.803 m), weight 63.1 kg, SpO2 (!) 68 %.    Vent Mode: PRVC FiO2 (%):  [21 %-40 %] 21 % Set Rate:  [14 bmp] 14 bmp Vt Set:  [600 mL] 600 mL PEEP:  [5 cmH20] 5 cmH20 Plateau Pressure:  [23 cmH20] 23 cmH20   Intake/Output Summary (Last 24 hours) at 01/13/2021 0800 Last data filed at 01/13/2021 0700 Gross per 24 hour  Intake 305.74 ml  Output 2550 ml  Net -2244.26 ml   Filed Weights   01/10/21 0300 01/11/21 0600 01/12/21 0000  Weight: 66.1 kg 65.3 kg 63.1 kg   General:  Frail elderly male. Trach in place.  Neuro:  Lethargic, not responsive to verbal  HEENT:  [place]/AT, No JVD noted Cardiovascular:  Not assessed Lungs:  Breathing comfortably about 22 rpm Abdomen:  Not assessed Musculoskeletal:  No obvious deformity Skin:  Intact, MMM    Patient Lines/Drains/Airways Status    Active Line/Drains/Airways    Name Placement date Placement time Site Days   PICC Triple Lumen 12/08/2020 PICC Left Brachial 46 cm 0 cm 12/08/2020  1755  -- 55   Gastrostomy/Enterostomy Gastrostomy 18 Fr. LUQ 11/15/2020  1205  LUQ  55   Colostomy RUQ 11/20/2020  1031  RUQ  47   Ileostomy Standard (end) LLQ 11/12/2020  1027  LLQ  47   Urostomy Ureterostomy right RLQ --  --  RLQ  --   Incision (Closed) Abdomen Lower;Medial --  --  -- --   Tracheostomy Shiley Legacy 6 mm Cuffed 12/10/20  1500  6 mm  33   Pressure Injury 12/08/20 Coccyx Mid Stage 2 -  Partial thickness loss of dermis presenting as a shallow open injury with a red, pink wound bed without slough. 12/08/20  2000  -- 35   Pressure Injury 12/25/20 Neck Medial Stage 2 -  Partial thickness loss of dermis presenting as a shallow open injury with a red, pink wound bed without slough. under trach faceplate 12/25/20  5003  -- 18   Pressure Injury 01/04/21 Heel Right Deep Tissue Pressure Injury - Purple or maroon localized area of discolored intact skin or blood-filled blister due to damage of underlying soft tissue from pressure  and/or shear. 01/04/21  1200  -- 8   Pressure Injury 01/04/21 Heel Left Deep Tissue Pressure Injury - Purple or maroon localized area of discolored intact skin or blood-filled blister due to damage of underlying soft tissue from pressure and/or shear. 01/04/21  1200  -- 8   Wound / Incision (Open or Dehisced) 01/05/21 Skin tear Buttocks Left skin tear covered with foam 01/05/21  [facility] Problem list   HCAP  Ecoli and achromobacter xylosoxidans s/p >5d Zosyn shock  Assessment & Plan:   SBO, Ischemic Bowel -s/p ex lap with SBR and G tube 12/7  -s/p takeback 12/12 with necrotic SBR. 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      Best practice:  Diet: none Pain/Anxiety/Delirium protocol (if indicated): dilaudid gtt VAP protocol (if indicated): NA DVT prophylaxis: NA GI prophylaxis: NA Glucose control: NA Mobility: BR Code Status: DNR Family Communication: see notes from 1/31 Disposition: palliative    Goals of Care:    Georgann Housekeeper, AGACNP-BC [place] for personal pager PCCM on call pager [phone] until 7pm. Please call Elink 7p-7a.  [phone]  01/13/2021 8:08 AM

## 2021-01-13 NOTE — Progress Notes (Signed)
Nutrition Brief Note  Chart reviewed. Pt now transitioning to comfort care.  No further nutrition interventions warranted at this time.  Please re-consult as needed.   Mariana Single RD, LDN Clinical Nutrition Pager listed in [place]

## 2021-01-14 DIAGNOSIS — R0600 Dyspnea, unspecified: Secondary | ICD-10-CM

## 2021-01-14 DIAGNOSIS — J9690 Respiratory failure, unspecified, unspecified whether with hypoxia or hypercapnia: Secondary | ICD-10-CM | POA: Diagnosis not present

## 2021-01-14 DIAGNOSIS — R52 Pain, unspecified: Secondary | ICD-10-CM

## 2021-01-14 DIAGNOSIS — K56609 Unspecified intestinal obstruction, unspecified as to partial versus complete obstruction: Secondary | ICD-10-CM | POA: Diagnosis not present

## 2021-01-14 DIAGNOSIS — Z66 Do not resuscitate: Secondary | ICD-10-CM

## 2021-01-14 MED ORDER — LORAZEPAM 2 MG/ML IJ SOLN
2.0000 mg | INTRAMUSCULAR | Status: DC | PRN
  Administered 2021-01-14 – 2021-01-15 (×3): 2 mg via INTRAVENOUS
  Filled 2021-01-14 (×4): qty 1

## 2021-01-14 NOTE — Progress Notes (Signed)
Patient seen and examined this morning. Anticipate hospital death imminently on comfort care with orders sufficient at this time. D/w palliative care whose assistance in supporting the patient and family is appreciated. Also d/w RN to give ativan prn.   Vance Gather, MD 01/14/2021 4:58 PM

## 2021-01-14 NOTE — Progress Notes (Addendum)
Patient ID: Ronald Wolf, male   DOB: 01/13/1946, 75 y.o.   MRN: [id number]  Medical records reviewed.    Assessed patient at bedside, he appears comfortable.  He remain on dilaudid continuous gtt for symptom management.  Discussed with Dr Auburn Bilberry and bedside RN/Sybil and Andie  Received request to call patient's sister/Teresa.  I spoke to her by telephone.  Detailed conversation/education offered regarding current plan of care.    Focus of care is comfort and dignity, supported by daughter and family reflected in decision to liberate [name] from the ventilator and allow a naturel death discussed on 02/05/21.   Continued education detailing a comfort path; stopping all unnecessary measures such as blood draws, needle sticks and frequent vital signs; doing everything to enhance patient's comfort  She had many questions regarding  artifical feeding and hydration.  Discussed that allowing a body to take its natural process does not includes artifical means and may actually cause more harm. Emotional support offered.   This is a difficult time for the family.  Further education regarding the natural trajectory and expectations at end-of-life was offered.    Addressed concern regarding odors from abdominal wound, consulted wound care nurse and initiated betadine guaze dressings bid on recommendation.   We discussed that patient's daughter Chip Boer is the main contact and decision maker and that my updates will be directly with her, Gaylord Shih understands.  She expresses concern for [place], and again how hard this is on everyone.  Plan of care -DNR/DNI -No further vent support, trach to room air (discussed that oxygen is not helpful for [name] at this time) -No artificial feeding now or in the future, at family request Iv fluids started at 10c /hr  -No further diagnostics, lab draws -Symptom management     - Dilaudid drip currently in place, titration and bolus in place     -  Robinul for terminal secretions     - Ativan for agitation/anxiety -Emotional support offered -Chaplain services triggered -Prognosis is likely hours to days,  expect a hospital death  Questions and concerns addressed   Discussed with  Dr Bonner Puna and nursing staff  Total time spent on the unit was 60 minutes  PMT will continue to support holistically   Greater than 50% of the time was spent in counseling and coordination of care  Wadie Lessen NP  Palliative Medicine Team Team Phone # [phone] Pager [phone]

## 2021-01-14 NOTE — Consult Note (Signed)
[place] Nurse wound follow up Patient receiving care in [place].  Primary RN, Cybill, present for odor assessment.  The patient is full comfort care at this time. Palliative care nurse, Wadie Lessen, called asking me to assess the patient and make recommendations for odor control.  As she explained it, the family are very concerned about the odor they experience when the patient's covers are lifted.  Cybill explained she bathed the patient, changed his linens, and sprayed air freshener this morning.  I could detect a slight odor when I removed the abdominal dressing, but it was a mild odor.  I changed the abdominal dressing while there. There was some old drainage on the abdominal binder that could have been contributing to odor.  I removed this and discarded it.  Then, I spoke with Stanton Kidney about some possible methods to assist with odor control.  They are: begin twice daily betadine moistened gauze dressings to the abdominal wound.  Cat litter in a pan may help absorb some room odor--which I really couldn't detect during my assessment.  And, Stanton Kidney suggested use of coffee grounds in cups around the room.  She will order approaches as she deems appropriate.   Val Riles, RN, MSN, CWOCN, CNS-BC, pager [phone]

## 2021-01-15 DIAGNOSIS — Z7189 Other specified counseling: Secondary | ICD-10-CM | POA: Diagnosis not present

## 2021-01-15 DIAGNOSIS — J9610 Chronic respiratory failure, unspecified whether with hypoxia or hypercapnia: Secondary | ICD-10-CM | POA: Diagnosis not present

## 2021-01-15 DIAGNOSIS — Z431 Encounter for attention to gastrostomy: Secondary | ICD-10-CM

## 2021-01-15 DIAGNOSIS — N179 Acute kidney failure, unspecified: Secondary | ICD-10-CM | POA: Diagnosis not present

## 2021-01-15 DIAGNOSIS — G9340 Encephalopathy, unspecified: Secondary | ICD-10-CM | POA: Diagnosis not present

## 2021-01-15 NOTE — Progress Notes (Signed)
Pt transferred with SWOT RN to 5N01. All belongings sent with patient.   Report called to 5N RN.  Notified Ulonda, pt's daughter, of patient's transfer.

## 2021-01-15 NOTE — Progress Notes (Signed)
PROGRESS NOTE  Ronald Wolf  [id number] DOB: [birth year] DOA: 12/02/2020 PCP: Mosie Lukes, MD   Brief Narrative: Ronald Wolf is a 75 yo M w/ PMH of CVA, Seizures, Dementia, Bladder ca s/p urostomy, tobacco use, right glottic carcinoma s/p excision presenting with sbo. Found to have ischemic bowel requiring ex-lap12/7. Course complicated by septic shock, acute respiratory failure and AKI return to the OR 12/12 for necrotic small bowel resection of additional 70 cm inclusive of prior anastomosis 12/15 abdominal closure with ileostomy He has had a prolonged ICU stay with ventilator dependence, had a tracheostomy on 12/29 Remains in ICU for ventilator dependence and arrhythmias  Significant Hospital Events:  12/03/2020 Admission 11/19/2020 OR 12/04/2020 Admit to ICU 12/10 Brief SVT with associated hypertension. Self limited Given fentanyl, metop and hydral for HTN 12/11 Good urine output with Lasix , failed extubation, reintubated within 2 hours due to inability to clear secretions 12/12 return OR for enteric leak-- found to have bowel necrosis. Left in discontinuity  12/13 self extubated, reintubated. On pressors. Family meeting with PCCM and CCS to discuss possible next steps.  12/15-was back in OR, abdominal closure, RUQ mucus fistula and L ileostomy  12/19: No significant overnight events, high ileostomy output 12/21: scheduled for family meeting. Worse hypernatremia, continued high ileostomy output. Attempted PSV/CPAP wean, pulled volumes of high 100s -low/mid 200s with tachypnea. Placed back of full support.  12/22 ongoing high output GI/ ileostomy, TPN started; weaned 12/5 all day; off fentanyl gtt, remains on precedex  12/24 IR drain , 1 unit PRBC transfusion for hemoglobin 6.5 12/26 added vancomycin for GPC in blood cultures and RIC suggesting MRSA 12/29 tracheostomy 12/31 Palliative discussion > family understanding that his situation is critical and he has not made much improvement   1/6 ongoing palliative conversations. No progress from pt standpoint. 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  1/11 seen by surgery, IR consulted to remove percutaneous drain.  Added octreotide in effort to try to decrease output 1/12: More fatigued back on vent 1/14: Opens eyes to voice and grimaces to pain, ?tracking. Severely deconditioned and suspect long recovery requiring vent dependence at this point. Reconsult PT for developing contractures 1/15: Tolerated trach collar 1/16: Tolerated trach collar, improved alertness 1/17: Overnight had bradycardia. This morning 3 episodes of asystole <30-60s reported per nursing 1/20: visible discomfort, started on hydromorphone  1/21: family wishes for ongoing aggressive care and target destination care facility -- refused by [facility] due to poor prognosis. Prolongation of dying process again explained to family  1/24 palliative unable to reach family. Unable to try TC due to debilitation and poor tolerance of PSV 1/26: remains vent dependent -- intolerant of PSV for more than a couple hours thus unable to try TC. 1/27: palliative had multiple conversations with family.   1/28: vomited EN, EN held. WBC increase to 18 from 11. Daughter visited. Saw abdomen and was very emotional. 1/29 pt more lethargic.  1/31 GOC discussion with PMT resulted in compassionate extubation and comfort care.  2/3 Transfer to 5N on comfort measures  Assessment & Plan: Active Problems:   HTN (hypertension)   PEG (percutaneous endoscopic gastrostomy) adjustment/replacement/removal ([place])   History of  bladder cancer   Seizure ([place])   History of stroke    Small bowel obstruction ([facility])   Hypernatremia   AKI (acute kidney injury) ([place])   Lactic acidosis   Protein-calorie malnutrition, severe   Encephalopathy acute   Respiratory failure ([place])   Shock ([place])   Pelvic abscess in male [facility])   Pressure injury of skin   Surgical abdomen   Status post tracheostomy [facility])   Advanced care planning/counseling discussion   Chronic respiratory failure requiring continuous mechanical ventilation through tracheostomy ([place])  SBO, Ischemic Bowel -s/p ex lap with SBR and G tube 12/7  -s/p takeback 12/12 with necrotic SBR. 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  - Continue IV ativan prn anxiety/agitation. - D/w palliative care daily. - Patient stable for transfer to palliative care floor. Would not likely survive transfer to residential hospice. Anticipate hospital demise in [place].   RN Pressure Injury Documentation: Pressure Injury 12/08/20 Coccyx Mid Stage 2 -  Partial thickness loss of dermis presenting as a shallow open injury with a red, pink wound bed without slough. (Active)  12/08/20 2000  Location: Coccyx  Location Orientation: Mid  Staging: Stage 2 -  Partial thickness loss of dermis presenting as a shallow open injury with a red, pink wound bed without slough.  Wound Description (Comments):   Present on Admission: No      Pressure Injury 12/25/20 Neck Medial Stage 2 -  Partial thickness loss of dermis presenting as a shallow open injury with a red, pink wound bed without slough. under trach faceplate (Active)  [id number]  Location: Neck  Location Orientation: Medial  Staging: Stage 2 -  Partial thickness loss of dermis presenting as a shallow open injury with a red, pink wound bed without slough.  Wound Description (Comments): under trach faceplate  Present on Admission: No     Pressure Injury 01/04/21 Heel Right Deep Tissue Pressure Injury - Purple or maroon localized area of discolored intact skin or blood-filled blister due to damage of underlying soft tissue from pressure and/or shear. (Active)  01/04/21 1200  Location: Heel  Location Orientation: Right  Staging: Deep Tissue Pressure Injury - Purple or maroon localized area of discolored intact skin or blood-filled blister due to damage of underlying soft tissue from pressure and/or shear.  Wound Description (Comments):   Present on Admission:      Pressure Injury 01/04/21 Heel Left Deep Tissue Pressure Injury - Purple or maroon localized area of discolored intact skin or blood-filled blister due to damage of underlying soft tissue from pressure and/or shear. (Active)  01/04/21 1200  Location: Heel  Location Orientation: Left  Staging: Deep Tissue Pressure Injury - Purple or maroon localized area of discolored intact skin or blood-filled blister due to damage of underlying soft tissue from pressure and/or shear.  Wound Description (Comments):   Present on Admission:     Severe protein-calorie malnutrition: Estimated body mass index is 19.4 kg/m as calculated from the following:   Height  as of this encounter: 5\' 11"  (1.803 m).   Weight as of this encounter: 63.1 kg.  DVT prophylaxis: None Code Status: A.N.D. Family Communication: None at bedside Disposition Plan: Anticipate hospital death. Does not appear to be stable for transfer to  residential hospice.  Consultants:   Surgery  PCCM  Palliative  Procedures:  11/17/2020 Ex-lap, G-tube placement 12/7 PICC >> ETT 12/7 >> 12/11, >>1/31 12/15- back to OR for abdominal closure, exploratory laparotomy  12/24 CT-guided right pelvic abscess drain 12/29 trach  Antimicrobials: Fluc 12/8>> 12/10 Zosyn 12/8> 12/12 cefotetain 12/15>> off Zosyn 12/23 >> 12/30 eraxis 12/24 >> 1/2 Vancomycin 12/26 >>12/31 Unasyn 12/31 >1/9 zosyn 1/9>> vanc 1/9 >>off  Subjective: Pt resting quietly in no distress. Not interactive.  Objective: Vitals:   01/15/21 0800 01/15/21 0815 01/15/21 0905 01/15/21 1119  BP:   (!) 150/69   Pulse: (!) 103 (!) 105  97  Resp: 16 18  16   Temp:      TempSrc:      SpO2: (!) 84% (!) 87%  (!) 88%  Weight:      Height:        Intake/Output Summary (Last 24 hours) at 01/15/2021 1359 Last data filed at 01/15/2021 1100 Gross per 24 hour  Intake 628.85 ml  Output 225 ml  Net 403.85 ml   Filed Weights   01/10/21 0300 01/11/21 0600 01/12/21 0000  Weight: 66.1 kg 65.3 kg 63.1 kg    Gen: Acutely and chronically ill-appearing male resting calmly Pulm: Non-labored with rate in 20's. CV: Regular tachycardia. Ext: Warm, dry Skin: No purulence or bleeding from multiple ostomy sites. Neuro: Calm, does not open eyes to voice or touch.  Data Reviewed: I have personally reviewed following labs and imaging studies  CBC: Recent Labs  Lab 01/09/21 0329 01/10/21 0344 01/11/21 0309  WBC 18.8* 12.6* 7.7  HGB 8.2* 7.9* 8.2*  HCT 26.9* 25.9* 25.8*  MCV 100.0 98.9 96.6  PLT 336 290 696   Basic Metabolic Panel: Recent Labs  Lab 01/09/21 0329 01/10/21 0344 01/11/21 0309 01/12/21 0338  NA 133* 130* 132*  --   K 4.0 3.8 4.5  --   CL 102 98 99  --   CO2 24 24 23   --   GLUCOSE 177* 102* 98  --   BUN 81* 93* 93*  --   CREATININE 1.04 1.07 1.30*  --   CALCIUM 8.4* 8.3* 8.5*  --   MG  --   --   --  2.2  PHOS  --   --   --  4.9*   GFR: Estimated  Creatinine Clearance: 44.5 mL/min (A) (by C-G formula based on SCr of 1.3 mg/dL (H)). Liver Function Tests: Recent Labs  Lab 01/11/21 0309  AST 45*  ALT 35  ALKPHOS 138*  BILITOT 0.8  PROT 6.9  ALBUMIN 1.7*   No results for input(s): LIPASE, AMYLASE in the last 168 hours. No results for input(s): AMMONIA in the last 168 hours. Coagulation Profile: No results for input(s): INR, PROTIME in the last 168 hours. Cardiac Enzymes: No results for input(s): CKTOTAL, CKMB, CKMBINDEX, TROPONINI in the last 168 hours. BNP (last 3 results) No results for input(s): PROBNP in the last 8760 hours. HbA1C: No results for input(s): HGBA1C in the last 72 hours. CBG: Recent Labs  Lab 01/11/21 1919 01/11/21 2333 01/12/21 0339 01/12/21 0751 01/12/21 1126  GLUCAP 97 97 100* 90 92   Lipid Profile: No results for input(s): CHOL, HDL, LDLCALC,  TRIG, CHOLHDL, LDLDIRECT in the last 72 hours. Thyroid Function Tests: No results for input(s): TSH, T4TOTAL, FREET4, T3FREE, THYROIDAB in the last 72 hours. Anemia Panel: No results for input(s): VITAMINB12, FOLATE, FERRITIN, TIBC, IRON, RETICCTPCT in the last 72 hours. Urine analysis:    Component Value Date/Time   COLORURINE YELLOW 2020/11/19 2102   APPEARANCEUR HAZY (A) November 19, 2020 2102   LABSPEC 1.013 11/19/20 2102   PHURINE 7.0 11-19-2020 2102   GLUCOSEU NEGATIVE 2020/11/19 2102   GLUCOSEU NEGATIVE 08/21/2015 1412   HGBUR NEGATIVE November 19, 2020 2102   BILIRUBINUR NEGATIVE 2020-11-19 2102   BILIRUBINUR negative 12/20/2017 1846   KETONESUR NEGATIVE 11/19/20 2102   PROTEINUR 100 (A) 19-Nov-2020 2102   UROBILINOGEN 0.2 12/20/2017 1846   UROBILINOGEN 0.2 08/21/2015 1412   NITRITE NEGATIVE 11-19-2020 2102   LEUKOCYTESUR NEGATIVE 19-Nov-2020 2102   No results found for this or any previous visit (from the past 240 hour(s)).    Radiology Studies: No results found.  Scheduled Meds: . glycopyrrolate  0.4 mg Intravenous Q4H  . pantoprazole sodium   40 mg Per Tube QHS  . sodium chloride flush  10-40 mL Intracatheter Q12H  . sodium chloride flush  5 mL Intracatheter Q8H   Continuous Infusions: . sodium chloride 10 mL/hr at 01/02/21 1735  . sodium chloride 10 mL/hr at 01/15/21 1100  . HYDROmorphone 10 mg/hr (01/15/21 1100)     LOS: 64 days   Time spent: 35 minutes.  Patrecia Pour, MD [facility] [email or web address] 01/15/2021, 1:59 PM

## 2021-01-15 NOTE — Progress Notes (Signed)
Pt arrived to unit from [facility]. Pt resting comfortable. Responds to pain when moving legs only.   1445: Pt's brother in room

## 2021-01-16 DIAGNOSIS — N179 Acute kidney failure, unspecified: Secondary | ICD-10-CM | POA: Diagnosis not present

## 2021-01-16 DIAGNOSIS — Z66 Do not resuscitate: Secondary | ICD-10-CM | POA: Diagnosis not present

## 2021-01-16 DIAGNOSIS — Z7189 Other specified counseling: Secondary | ICD-10-CM | POA: Diagnosis not present

## 2021-01-16 DIAGNOSIS — Z515 Encounter for palliative care: Secondary | ICD-10-CM | POA: Diagnosis not present

## 2021-01-16 DIAGNOSIS — J9610 Chronic respiratory failure, unspecified whether with hypoxia or hypercapnia: Secondary | ICD-10-CM | POA: Diagnosis not present

## 2021-01-16 DIAGNOSIS — G9340 Encephalopathy, unspecified: Secondary | ICD-10-CM | POA: Diagnosis not present

## 2021-01-16 MED ORDER — LORAZEPAM 2 MG/ML IJ SOLN
1.0000 mg | INTRAMUSCULAR | Status: DC
  Administered 2021-01-16 – 2021-01-19 (×16): 1 mg via INTRAVENOUS
  Filled 2021-01-16 (×15): qty 1

## 2021-01-16 NOTE — Progress Notes (Addendum)
Palliative Medicine Inpatient Follow Up Note  HPI: Ronald Wolf is a 75 yo M w/ PMH of CVA, Seizures, Dementia, Bladder ca s/p urostomy, tobacco use, right glottic carcinoma s/p excision presenting with sbo. Found to have ischemic bowel requiring ex-lap12/7. Course complicated by septic shock, acute respiratory failure and AKI return to the OR 12/12 for necrotic small bowel resection of additional 70 cm inclusive of prior anastomosis on 12/15 abdominal closure with ileostomy. He has had a prolonged ICU stay with ventilator dependence, had a tracheostomy on 12/29 - compassionate extubation on 1/31.  Palliative care consulted to aid in goals of care conversations.   Today's Discussion (01/16/2021):  *Please note that this is a verbal dictation therefore any spelling or grammatical errors are due to the "[place] One" system interpretation.   Chart reviewed.  Received a message from nursing that patients family did not understand comfort care.   Ronald, Wolf and I met with patients daughter, Ronald Wolf and brother, Ronald Wolf this afternoon. We clarified concepts of comfort focused care. We reviewed the families concerns about oxygenation and that providing high dose oxygen at the end of life does not aid in the death and dying process rather it can cause at time an added layer of distress. We reviewed the use of opoid medications in such instances to attain better symptom management.   Ronald Wolf shares that this is the least comfortable his brother has looked since transition to comfort care. He expresses that the patient has a frightened look in his eyes and that he had some muscle jerking of his left arm. We reviewed the involuntary movements that our bodies can make at the end of life. Reviewed the "rally" phenomenon in the contest of Ronald Wolf's case.  We discussed more comprehensive symptom management with dilaudid and ativan. Talked about ATC ativan and increase of dilaudid gtt.   Provided "Gone From My  Site" booklets to patients daughter and brother.   Questions and concerns addressed in a timely manner  Patients RN, Mable Fill provided an update after we spoke to family  Objective Assessment: Vital Signs Vitals:   01/16/21 1142 01/16/21 1511  BP: (!) 146/73   Pulse: (!) 133 (!) 120  Resp: 18 14  Temp: 98 F (36.7 C)   SpO2: 94% (!) 74%    Intake/Output Summary (Last 24 hours) at 01/16/2021 1642 Last data filed at 01/16/2021 1300 Gross per 24 hour  Intake 0 ml  Output 250 ml  Net -250 ml   Last Weight  Most recent update: 01/12/2021  1:03 AM   Weight  63.1 kg (139 lb 1.8 oz)           Gen:  Elderly AA M in NAD HEENT: (+) trach, dry mucous membranes CV: Regular rate and rhythm  PULM: On RA ABD: soft/nontender  EXT: No edema, (+) cyanosis in toes which are cool at the tips Neuro: Somnolent  SUMMARY OF RECOMMENDATIONS   -DNR/DNI -Trach to room air -No artificial feeding or 11m/hr NS for hydration (per family request) -No further diagnostics, lab draws -Symptom management     - Dilaudid drip currently in place, titration and bolus orders updated     - Robinul for terminal secretions     - Ativan for agitation/anxiety PRN and ATC dosing -Emotional support offered -Chaplain services triggered -Prognosis is likely days,  expect a hospital death  Time Spent: 35 Greater than 50% of the time was spent in counseling and coordination of care ______________________________________________________________________________________ [facility]  Palliative Medicine Team Team Cell Phone: [phone] Please utilize secure chat with additional questions, if there is no response within 30 minutes please call the above phone number  Palliative Medicine Team providers are available by phone from 7am to 7pm daily and can be reached through the team cell phone.  Should this patient require assistance outside of these hours, please call the patient's attending  physician.

## 2021-01-16 NOTE — Plan of Care (Signed)
  Problem: Nutrition: Goal: Adequate nutrition will be maintained Outcome: Not Progressing   Problem: Pain Managment: Goal: General experience of comfort will improve Outcome: Not Progressing   Problem: Safety: Goal: Ability to remain free from injury will improve Outcome: Not Progressing   Problem: Skin Integrity: Goal: Risk for impaired skin integrity will decrease Outcome: Not Progressing

## 2021-01-16 NOTE — Plan of Care (Signed)
  Problem: Pain Managment: Goal: General experience of comfort will improve Outcome: Progressing   Problem: Safety: Goal: Ability to remain free from injury will improve Outcome: Progressing   Problem: Skin Integrity: Goal: Risk for impaired skin integrity will decrease Outcome: Progressing   

## 2021-01-16 NOTE — Progress Notes (Signed)
PROGRESS NOTE  Ronald Wolf  [id number] DOB: 16-Apr-1946 DOA: 12/02/2020 PCP: Mosie Lukes, MD   Brief Narrative: Ronald Wolf is a 75 yo M w/ PMH of CVA, Seizures, Dementia, Bladder ca s/p urostomy, tobacco use, right glottic carcinoma s/p excision presenting with sbo. Found to have ischemic bowel requiring ex-lap12/7. Course complicated by septic shock, acute respiratory failure and AKI return to the OR 12/12 for necrotic small bowel resection of additional 70 cm inclusive of prior anastomosis 12/15 abdominal closure with ileostomy He has had a prolonged ICU stay with ventilator dependence, had a tracheostomy on 12/29 Remains in ICU for ventilator dependence and arrhythmias  Significant Hospital Events:  12/12/2020 Admission 11/16/2020 OR 11/26/2020 Admit to ICU 12/10 Brief SVT with associated hypertension. Self limited Given fentanyl, metop and hydral for HTN 12/11 Good urine output with Lasix , failed extubation, reintubated within 2 hours due to inability to clear secretions 12/12 return OR for enteric leak-- found to have bowel necrosis. Left in discontinuity  12/13 self extubated, reintubated. On pressors. Family meeting with PCCM and CCS to discuss possible next steps.  12/15-was back in OR, abdominal closure, RUQ mucus fistula and L ileostomy  12/19: No significant overnight events, high ileostomy output 12/21: scheduled for family meeting. Worse hypernatremia, continued high ileostomy output. Attempted PSV/CPAP wean, pulled volumes of high 100s -low/mid 200s with tachypnea. Placed back of full support.  12/22 ongoing high output GI/ ileostomy, TPN started; weaned 12/5 all day; off fentanyl gtt, remains on precedex  12/24 IR drain , 1 unit PRBC transfusion for hemoglobin 6.5 12/26 added vancomycin for GPC in blood cultures and RIC suggesting MRSA 12/29 tracheostomy 12/31 Palliative discussion > family understanding that his situation is critical and he has not made much improvement   1/6 ongoing palliative conversations. No progress from pt standpoint. 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  1/11 seen by surgery, IR consulted to remove percutaneous drain.  Added octreotide in effort to try to decrease output 1/12: More fatigued back on vent 1/14: Opens eyes to voice and grimaces to pain, ?tracking. Severely deconditioned and suspect long recovery requiring vent dependence at this point. Reconsult PT for developing contractures 1/15: Tolerated trach collar 1/16: Tolerated trach collar, improved alertness 1/17: Overnight had bradycardia. This morning 3 episodes of asystole <30-60s reported per nursing 1/20: visible discomfort, started on hydromorphone  1/21: family wishes for ongoing aggressive care and target destination care facility -- refused by [facility] due to poor prognosis. Prolongation of dying process again explained to family  1/24 palliative unable to reach family. Unable to try TC due to debilitation and poor tolerance of PSV 1/26: remains vent dependent -- intolerant of PSV for more than a couple hours thus unable to try TC. 1/27: palliative had multiple conversations with family.   1/28: vomited EN, EN held. WBC increase to 18 from 11. Daughter visited. Saw abdomen and was very emotional. 1/29 pt more lethargic.  1/31 GOC discussion with PMT resulted in compassionate extubation and comfort care.  2/3 Transfer to 5N on comfort measures  Assessment & Plan: Active Problems:   HTN (hypertension)   PEG (percutaneous endoscopic gastrostomy) adjustment/replacement/removal ([place])   History of  bladder cancer   Seizure ([place])   History of stroke    Small bowel obstruction ([facility])   Hypernatremia   AKI (acute kidney injury) ([place])   Lactic acidosis   Protein-calorie malnutrition, severe   Encephalopathy acute   Respiratory failure ([place])   Shock ([place])   Pelvic abscess in male [facility])   Pressure injury of skin   Surgical abdomen   Status post tracheostomy [facility])   Advanced care planning/counseling discussion   Chronic respiratory failure requiring continuous mechanical ventilation through tracheostomy ([place])  SBO, Ischemic Bowel -s/p ex lap with SBR and G tube 12/7  -s/p takeback 12/12 with necrotic SBR. 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 Tachypneic and labored this morning when dilaudid down to 5mg /hr, contacted RN to increase rate and bolus prn. No family at bedside on my evaluation. - Continue IV ativan prn anxiety/agitation. - D/w palliative care daily. - Patient not felt to be stable for transfer to hospice and this would likely pose undue stress to family. Will continue aggressive measures to alleviate suffering, maximize dignity as inpatient.  RN Pressure Injury Documentation: Pressure Injury 12/08/20 Coccyx Mid Stage 2 -  Partial thickness loss of dermis presenting as a shallow open injury with a red, pink wound bed without slough. (Active)  12/08/20 2000  Location: Coccyx  Location Orientation: Mid  Staging: Stage 2 -   Partial thickness loss of dermis presenting as a shallow open injury with a red, pink wound bed without slough.  Wound Description (Comments):   Present on Admission: No     Pressure Injury 12/25/20 Neck Medial Stage 2 -  Partial thickness loss of dermis presenting as a shallow open injury with a red, pink wound bed without slough. under trach faceplate (Active)  [id number]  Location: Neck  Location Orientation: Medial  Staging: Stage 2 -  Partial thickness loss of dermis presenting as a shallow open injury with a red, pink wound bed without slough.  Wound Description (Comments): under trach faceplate  Present on Admission: No     Pressure Injury 01/04/21 Heel Right Deep Tissue Pressure Injury - Purple or maroon localized area of discolored intact skin or blood-filled blister due to damage of underlying soft tissue from pressure and/or shear. (Active)  01/04/21 1200  Location: Heel  Location Orientation: Right  Staging: Deep Tissue Pressure Injury - Purple or maroon localized area of discolored intact skin or blood-filled blister due to damage of underlying soft tissue from pressure and/or shear.  Wound Description (Comments):   Present on Admission:      Pressure Injury 01/04/21 Heel Left Deep Tissue Pressure Injury - Purple or maroon localized area of discolored intact skin or blood-filled blister due to damage of underlying soft tissue from pressure and/or shear. (Active)  01/04/21 1200  Location: Heel  Location Orientation: Left  Staging: Deep Tissue Pressure Injury - Purple or maroon localized area of discolored intact skin or blood-filled blister due to damage of underlying soft tissue from pressure and/or shear.  Wound Description (Comments):   Present on Admission:     Severe protein-calorie malnutrition: Estimated body mass index is 19.4 kg/m as calculated from the following:   Height as of this encounter: 5\' 11"  (1.803 m).   Weight as of this encounter: 63.1 kg.  DVT  prophylaxis: None Code Status: A.N.D. Family Communication: None at bedside Disposition Plan: Anticipate hospital death. Does not appear to be stable for transfer to residential hospice.  Consultants:   Surgery  PCCM  Palliative  Procedures:  11/15/2020 Ex-lap, G-tube placement 12/7 PICC >> ETT 12/7 >> 12/11, >>1/31 12/15- back to OR for abdominal closure, exploratory laparotomy  12/24 CT-guided right pelvic abscess drain 12/29 trach  Antimicrobials: Fluc 12/8>> 12/10 Zosyn 12/8> 12/12 cefotetain 12/15>> off Zosyn 12/23 >> 12/30 eraxis 12/24 >> 1/2 Vancomycin 12/26 >>12/31 Unasyn 12/31 >1/9 zosyn 1/9>> vanc 1/9 >>off  Subjective: Eyes open during linen change, not meaningfully responsive. No overnight events noted.  Objective: Vitals:   01/15/21 1924 01/15/21 1930 01/16/21 0812 01/16/21 1142  BP:  137/67  (!) 146/73  Pulse: (!) 102 (!) 102 (!) 129 (!) 133  Resp: 14 20 18 18   Temp:  98 F (36.7 C)  98 F (36.7 C)  TempSrc:  Oral  Oral  SpO2: 90% (!) 87% (!) 76% 94%  Weight:      Height:       No intake or output data in the 24 hours ending 01/16/21 1158 Filed Weights   01/10/21 0300 01/11/21 0600 01/12/21 0000  Weight: 66.1 kg 65.3 kg 63.1 kg   Gen: Acutely and chronically ill-appearing male in some distress Pulm: Labored tachypnea through trach. CV: Regular tachycardia without murmur, rub, or gallop. No JVD, no dependent edema. GI: Abdomen non-distended  Ext: Warm, dry Skin: No new rashes, lesions or ulcers on visualized skin. Ostomy sites without purulence Neuro: Alert and not interactive. Psych: Appears anxious  Data Reviewed: I have personally reviewed following labs and imaging studies  CBC: Recent Labs  Lab 01/10/21 0344 01/11/21 0309  WBC 12.6* 7.7  HGB 7.9* 8.2*  HCT 25.9* 25.8*  MCV 98.9 96.6  PLT 290 165   Basic Metabolic Panel: Recent Labs  Lab 01/10/21 0344 01/11/21 0309 01/12/21 0338  NA 130* 132*  --   K 3.8 4.5  --   CL 98  99  --   CO2 24 23  --   GLUCOSE 102* 98  --   BUN 93* 93*  --   CREATININE 1.07 1.30*  --   CALCIUM 8.3* 8.5*  --   MG  --   --  2.2  PHOS  --   --  4.9*   GFR: Estimated Creatinine Clearance: 44.5 mL/min (A) (by C-G formula based on SCr of 1.3 mg/dL (H)). Liver Function Tests: Recent Labs  Lab 01/11/21 0309  AST 45*  ALT 35  ALKPHOS 138*  BILITOT 0.8  PROT 6.9  ALBUMIN 1.7*   No results for input(s): LIPASE, AMYLASE in the last 168 hours. No results for input(s): AMMONIA in the last 168 hours. Coagulation Profile: No results for input(s): INR, PROTIME in the last 168 hours. Cardiac Enzymes: No results for input(s): CKTOTAL, CKMB, CKMBINDEX, TROPONINI in the last 168 hours. BNP (last 3 results) No results for input(s): PROBNP in the last 8760 hours. HbA1C: No results for input(s): HGBA1C in the last 72 hours. CBG: Recent Labs  Lab 01/11/21 1919 01/11/21 2333 01/12/21 0339 01/12/21 0751 01/12/21 1126  GLUCAP 97 97 100*  90 92   Lipid Profile: No results for input(s): CHOL, HDL, LDLCALC, TRIG, CHOLHDL, LDLDIRECT in the last 72 hours. Thyroid Function Tests: No results for input(s): TSH, T4TOTAL, FREET4, T3FREE, THYROIDAB in the last 72 hours. Anemia Panel: No results for input(s): VITAMINB12, FOLATE, FERRITIN, TIBC, IRON, RETICCTPCT in the last 72 hours. Urine analysis:    Component Value Date/Time   COLORURINE YELLOW 12/06/2020 2102   APPEARANCEUR HAZY (A) 11/28/2020 2102   LABSPEC 1.013 11/16/2020 2102   PHURINE 7.0 11/24/2020 2102   GLUCOSEU NEGATIVE 12/09/2020 2102   GLUCOSEU NEGATIVE 08/21/2015 1412   HGBUR NEGATIVE 11/16/2020 2102   BILIRUBINUR NEGATIVE 12/03/2020 2102   BILIRUBINUR negative 12/20/2017 1846   KETONESUR NEGATIVE 11/15/2020 2102   PROTEINUR 100 (A) 11/27/2020 2102   UROBILINOGEN 0.2 12/20/2017 1846   UROBILINOGEN 0.2 08/21/2015 1412   NITRITE NEGATIVE 12/06/2020 2102   LEUKOCYTESUR NEGATIVE 11/29/2020 2102   No results found for  this or any previous visit (from the past 240 hour(s)).    Radiology Studies: No results found.  Scheduled Meds: . glycopyrrolate  0.4 mg Intravenous Q4H  . pantoprazole sodium  40 mg Per Tube QHS  . sodium chloride flush  10-40 mL Intracatheter Q12H  . sodium chloride flush  5 mL Intracatheter Q8H   Continuous Infusions: . sodium chloride 10 mL/hr at 01/02/21 1735  . sodium chloride 10 mL/hr at 01/15/21 1100  . HYDROmorphone 5 mg/hr (01/16/21 0423)     LOS: 65 days   Time spent: 35 minutes.  Patrecia Pour, MD [facility] [email or web address] 01/16/2021, 11:58 AM

## 2021-01-17 DIAGNOSIS — Z7189 Other specified counseling: Secondary | ICD-10-CM | POA: Diagnosis not present

## 2021-01-17 DIAGNOSIS — J9610 Chronic respiratory failure, unspecified whether with hypoxia or hypercapnia: Secondary | ICD-10-CM | POA: Diagnosis not present

## 2021-01-17 DIAGNOSIS — N179 Acute kidney failure, unspecified: Secondary | ICD-10-CM | POA: Diagnosis not present

## 2021-01-17 DIAGNOSIS — G9340 Encephalopathy, unspecified: Secondary | ICD-10-CM | POA: Diagnosis not present

## 2021-01-17 DIAGNOSIS — Z66 Do not resuscitate: Secondary | ICD-10-CM | POA: Diagnosis not present

## 2021-01-17 DIAGNOSIS — Z515 Encounter for palliative care: Secondary | ICD-10-CM | POA: Diagnosis not present

## 2021-01-17 MED ORDER — KETOROLAC TROMETHAMINE 30 MG/ML IJ SOLN
30.0000 mg | Freq: Four times a day (QID) | INTRAMUSCULAR | Status: AC
  Administered 2021-01-17 – 2021-01-18 (×6): 30 mg via INTRAVENOUS
  Filled 2021-01-17 (×6): qty 1

## 2021-01-17 NOTE — Plan of Care (Signed)
  Problem: Education: Goal: Knowledge of General Education information will improve Description: Including pain rating scale, medication(s)/side effects and non-pharmacologic comfort measures Outcome: Not Progressing   Problem: Activity: Goal: Risk for activity intolerance will decrease Outcome: Not Progressing   Problem: Nutrition: Goal: Adequate nutrition will be maintained Outcome: Not Progressing   Problem: Pain Managment: Goal: General experience of comfort will improve Outcome: Not Progressing   Problem: Safety: Goal: Ability to remain free from injury will improve Outcome: Not Progressing   Problem: Skin Integrity: Goal: Risk for impaired skin integrity will decrease Outcome: Not Progressing

## 2021-01-17 NOTE — Progress Notes (Signed)
PROGRESS NOTE  Ronald Wolf  [id number] DOB: [birth year] DOA: 11/30/2020 PCP: Mosie Lukes, MD   Brief Narrative: Mr.Hobdy is a 75 yo M w/ PMH of CVA, Seizures, Dementia, Bladder ca s/p urostomy, tobacco use, right glottic carcinoma s/p excision presenting with sbo. Found to have ischemic bowel requiring ex-lap12/7. Course complicated by septic shock, acute respiratory failure and AKI return to the OR 12/12 for necrotic small bowel resection of additional 70 cm inclusive of prior anastomosis 12/15 abdominal closure with ileostomy He has had a prolonged ICU stay with ventilator dependence, had a tracheostomy on 12/29 Remains in ICU for ventilator dependence and arrhythmias  Significant Hospital Events:  11/19/2020 Admission 11/30/2020 OR 11/15/2020 Admit to ICU 12/10 Brief SVT with associated hypertension. Self limited Given fentanyl, metop and hydral for HTN 12/11 Good urine output with Lasix , failed extubation, reintubated within 2 hours due to inability to clear secretions 12/12 return OR for enteric leak-- found to have bowel necrosis. Left in discontinuity  12/13 self extubated, reintubated. On pressors. Family meeting with PCCM and CCS to discuss possible next steps.  12/15-was back in OR, abdominal closure, RUQ mucus fistula and L ileostomy  12/19: No significant overnight events, high ileostomy output 12/21: scheduled for family meeting. Worse hypernatremia, continued high ileostomy output. Attempted PSV/CPAP wean, pulled volumes of high 100s -low/mid 200s with tachypnea. Placed back of full support.  12/22 ongoing high output GI/ ileostomy, TPN started; weaned 12/5 all day; off fentanyl gtt, remains on precedex  12/24 IR drain , 1 unit PRBC transfusion for hemoglobin 6.5 12/26 added vancomycin for GPC in blood cultures and RIC suggesting MRSA 12/29 tracheostomy 12/31 Palliative discussion > family understanding that his situation is critical and he has not made much improvement   1/6 ongoing palliative conversations. No progress from pt standpoint. 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  1/11 seen by surgery, IR consulted to remove percutaneous drain.  Added octreotide in effort to try to decrease output 1/12: More fatigued back on vent 1/14: Opens eyes to voice and grimaces to pain, ?tracking. Severely deconditioned and suspect long recovery requiring vent dependence at this point. Reconsult PT for developing contractures 1/15: Tolerated trach collar 1/16: Tolerated trach collar, improved alertness 1/17: Overnight had bradycardia. This morning 3 episodes of asystole <30-60s reported per nursing 1/20: visible discomfort, started on hydromorphone  1/21: family wishes for ongoing aggressive care and target destination care facility -- refused by [facility] due to poor prognosis. Prolongation of dying process again explained to family  1/24 palliative unable to reach family. Unable to try TC due to debilitation and poor tolerance of PSV 1/26: remains vent dependent -- intolerant of PSV for more than a couple hours thus unable to try TC. 1/27: palliative had multiple conversations with family.   1/28: vomited EN, EN held. WBC increase to 18 from 11. Daughter visited. Saw abdomen and was very emotional. 1/29 pt more lethargic.  1/31 GOC discussion with PMT resulted in compassionate extubation and comfort care.  2/3 Transfer to 5N on comfort measures 2/4 Uncomfortable, increase dilaudid infusion.  Assessment & Plan: Active Problems:   HTN (hypertension)   PEG (percutaneous endoscopic gastrostomy) adjustment/replacement/removal (  [place])   History of bladder  cancer   Seizure ([place])   History of stroke   Small bowel obstruction ([facility])   Hypernatremia   AKI (acute kidney injury) ([place])   Lactic acidosis   Protein-calorie malnutrition, severe   Encephalopathy acute   Respiratory failure ([place])   Shock ([place])   Pelvic abscess in male [facility])   Pressure injury of skin   Surgical abdomen   Status post tracheostomy [facility])   Advanced care planning/counseling discussion   Chronic respiratory failure requiring continuous mechanical ventilation through tracheostomy ([place])  SBO, Ischemic Bowel -s/p ex lap with SBR and G tube 12/7  -s/p takeback 12/12 with necrotic SBR. 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 - No family at bedside on my evaluation. - Continue IV ativan prn anxiety/agitation. - Patient not felt to be stable for transfer to hospice and this would likely pose undue stress to family. Will continue aggressive measures to alleviate suffering, maximize dignity as inpatient.  RN Pressure Injury Documentation: Pressure Injury 12/08/20 Coccyx Mid Stage 2 -  Partial thickness loss of dermis presenting as a shallow open injury with a red, pink wound bed without slough. (Active)  12/08/20 2000  Location: Coccyx  Location Orientation: Mid   Staging: Stage 2 -  Partial thickness loss of dermis presenting as a shallow open injury with a red, pink wound bed without slough.  Wound Description (Comments):   Present on Admission: No     Pressure Injury 12/25/20 Neck Medial Stage 2 -  Partial thickness loss of dermis presenting as a shallow open injury with a red, pink wound bed without slough. under trach faceplate (Active)  27/25/36 1500  Location: Neck  Location Orientation: Medial  Staging: Stage 2 -  Partial thickness loss of dermis presenting as a shallow open injury with a red, pink wound bed without slough.  Wound Description (Comments): under trach faceplate  Present on Admission: No     Pressure Injury 01/04/21 Heel Right Deep Tissue Pressure Injury - Purple or maroon localized area of discolored intact skin or blood-filled blister due to damage of underlying soft tissue from pressure and/or shear. (Active)  01/04/21 1200  Location: Heel  Location Orientation: Right  Staging: Deep Tissue Pressure Injury - Purple or maroon localized area of discolored intact skin or blood-filled blister due to damage of underlying soft tissue from pressure and/or shear.  Wound Description (Comments):   Present on Admission:      Pressure Injury 01/04/21 Heel Left Deep Tissue Pressure Injury - Purple or maroon localized area of discolored intact skin or blood-filled blister due to damage of underlying soft tissue from pressure and/or shear. (Active)  01/04/21 1200  Location: Heel  Location Orientation: Left  Staging: Deep Tissue Pressure Injury - Purple or maroon localized area of discolored intact skin or blood-filled blister due to damage of underlying soft tissue from  pressure and/or shear.  Wound Description (Comments):   Present on Admission:     Severe protein-calorie malnutrition: Estimated body mass index is 19.4 kg/m as calculated from the following:   Height as of this encounter: 5\' 11"  (1.803 m).   Weight as of this  encounter: 63.1 kg.  DVT prophylaxis: None Code Status: A.N.D. Family Communication: None at bedside Disposition Plan: Anticipate hospital death. Does not appear to be stable for transfer to residential hospice.  Consultants:   Surgery  PCCM  Palliative  Procedures:  11/17/2020 Ex-lap, G-tube placement 12/7 PICC >> ETT 12/7 >> 12/11, >>1/31 12/15- back to OR for abdominal closure, exploratory laparotomy  12/24 CT-guided right pelvic abscess drain 12/29 trach  Antimicrobials: Fluc 12/8>> 12/10 Zosyn 12/8> 12/12 cefotetain 12/15>> off Zosyn 12/23 >> 12/30 eraxis 12/24 >> 1/2 Vancomycin 12/26 >>12/31 Unasyn 12/31 >1/9 zosyn 1/9>> vanc 1/9 >>off  Subjective: Eyes closed, not meaningfully responsive. No overnight events noted.  Objective: Vitals:   01/16/21 1511 01/17/21 0348 01/17/21 0807 01/17/21 0900  BP:  124/69  118/61  Pulse: (!) 120 (!) 109 (!) 111 (!) 101  Resp: 14 16 14 17   Temp:  99 F (37.2 C)  98.9 F (37.2 C)  TempSrc:  Axillary  Axillary  SpO2: (!) 74% (!) 81% (!) 75% (!) 89%  Weight:      Height:        Intake/Output Summary (Last 24 hours) at 01/17/2021 1140 Last data filed at 01/16/2021 1842 Gross per 24 hour  Intake 0 ml  Output --  Net 0 ml   Filed Weights   01/10/21 0300 01/11/21 0600 01/12/21 0000  Weight: 66.1 kg 65.3 kg 63.1 kg   Gen: Diaphoretic, ill-appearing male resting quietly. Pulm: Tachypneic with slight retractions noted. Coarse. CV: Regular tachycardia without JVD, no dependent edema. GI: Abdomen non-distended, ostomy intact Ext: Warm, diaphoretic Skin: No new rashes, lesions or ulcers on visualized skin. Neuro: Not meaningfully responsive Psych: UTD   Data Reviewed: I have personally reviewed following labs and imaging studies  CBC: Recent Labs  Lab 01/11/21 0309  WBC 7.7  HGB 8.2*  HCT 25.8*  MCV 96.6  PLT 130   Basic Metabolic Panel: Recent Labs  Lab 01/11/21 0309 01/12/21 0338  NA 132*  --   K 4.5  --    CL 99  --   CO2 23  --   GLUCOSE 98  --   BUN 93*  --   CREATININE 1.30*  --   CALCIUM 8.5*  --   MG  --  2.2  PHOS  --  4.9*   GFR: Estimated Creatinine Clearance: 44.5 mL/min (A) (by C-G formula based on SCr of 1.3 mg/dL (H)). Liver Function Tests: Recent Labs  Lab 01/11/21 0309  AST 45*  ALT 35  ALKPHOS 138*  BILITOT 0.8  PROT 6.9  ALBUMIN 1.7*   No results for input(s): LIPASE, AMYLASE in the last 168 hours. No results for input(s): AMMONIA in the last 168 hours. Coagulation Profile: No results for input(s): INR, PROTIME in the last 168 hours. Cardiac Enzymes: No results for input(s): CKTOTAL, CKMB, CKMBINDEX, TROPONINI in the last 168 hours. BNP (last 3 results) No results for input(s): PROBNP in the last 8760 hours. HbA1C: No results for input(s): HGBA1C in the last 72 hours. CBG: Recent Labs  Lab 01/11/21 1919 01/11/21 2333 01/12/21 0339 01/12/21 0751 01/12/21 1126  GLUCAP 97 97 100* 90 92   Lipid Profile: No results for input(s): CHOL,  HDL, LDLCALC, TRIG, CHOLHDL, LDLDIRECT in the last 72 hours. Thyroid Function Tests: No results for input(s): TSH, T4TOTAL, FREET4, T3FREE, THYROIDAB in the last 72 hours. Anemia Panel: No results for input(s): VITAMINB12, FOLATE, FERRITIN, TIBC, IRON, RETICCTPCT in the last 72 hours. Urine analysis:    Component Value Date/Time   COLORURINE YELLOW 11/25/2020 2102   APPEARANCEUR HAZY (A) 12/11/2020 2102   LABSPEC 1.013 11/19/2020 2102   PHURINE 7.0 12/07/2020 2102   GLUCOSEU NEGATIVE 11/13/2020 2102   GLUCOSEU NEGATIVE 08/21/2015 1412   HGBUR NEGATIVE 11/27/2020 2102   BILIRUBINUR NEGATIVE 11/30/2020 2102   BILIRUBINUR negative 12/20/2017 1846   KETONESUR NEGATIVE 11/21/2020 2102   PROTEINUR 100 (A) 11/14/2020 2102   UROBILINOGEN 0.2 12/20/2017 1846   UROBILINOGEN 0.2 08/21/2015 1412   NITRITE NEGATIVE 11/17/2020 2102   LEUKOCYTESUR NEGATIVE 11/17/2020 2102   No results found for this or any previous visit  (from the past 240 hour(s)).    Radiology Studies: No results found.  Scheduled Meds: . glycopyrrolate  0.4 mg Intravenous Q4H  . ketorolac  30 mg Intravenous Q6H  . LORazepam  1 mg Intravenous Q4H  . pantoprazole sodium  40 mg Per Tube QHS  . sodium chloride flush  10-40 mL Intracatheter Q12H  . sodium chloride flush  5 mL Intracatheter Q8H   Continuous Infusions: . sodium chloride 250 mL (01/17/21 1050)  . sodium chloride 10 mL/hr at 01/15/21 1100  . HYDROmorphone 7 mg/hr (01/17/21 0726)     LOS: 66 days   Time spent: 35 minutes.  Patrecia Pour, MD [facility] [email or web address] 01/17/2021, 11:40 AM

## 2021-01-17 NOTE — Progress Notes (Signed)
   Palliative Medicine Inpatient Follow Up Note  HPI: Mr.Kalan is a 75 yo M w/ PMH of CVA, Seizures, Dementia, Bladder ca s/p urostomy, tobacco use, right glottic carcinoma s/p excision presenting with sbo. Found to have ischemic bowel requiring ex-lap12/7. Course complicated by septic shock, acute respiratory failure and AKI return to the OR 12/12 for necrotic small bowel resection of additional 70 cm inclusive of prior anastomosis on 12/15 abdominal closure with ileostomy. He has had a prolonged ICU stay with ventilator dependence, had a tracheostomy on 12/29 - compassionate extubation on 1/31.  Palliative care consulted to aid in goals of care conversations.   Today's Discussion (01/17/2021):  *Please note that this is a verbal dictation therefore any spelling or grammatical errors are due to the "[place] [employer]" system interpretation.   Chart reviewed.  I met at bedside with Glendell Docker this morning. He was noted to be somnolent. He was warm on his upper extremities and appeared diaphoretic. Added some Toradol ATC. Had some deep breaths with the use of intercostals - increased dilaudid (+) bolus.   There was no family at bedside to provide an update.  Ongoing palliative support  Objective Assessment: Vital Signs Vitals:   01/17/21 0348 01/17/21 0807  BP: 124/69   Pulse: (!) 109 (!) 111  Resp: 16 14  Temp: 99 F (37.2 C)   SpO2: (!) 81% (!) 75%    Intake/Output Summary (Last 24 hours) at 01/17/2021 0809 Last data filed at 01/16/2021 1842 Gross per 24 hour  Intake 0 ml  Output 250 ml  Net -250 ml   Last Weight  Most recent update: 01/12/2021  1:03 AM   Weight  63.1 kg (139 lb 1.8 oz)           Gen:  Elderly AA M in NAD HEENT: (+) trach, dry mucous membranes CV: Regular rate and rhythm  PULM: On RA ABD: soft/nontender  EXT: No edema, (+) cyanosis in toes which are cool at the tips Neuro: Somnolent  SUMMARY OF RECOMMENDATIONS   -DNR/DNI -Trach to room air -No  artificial feeding or 54ml/hr NS for hydration (per family request) -No further diagnostics, lab draws -Symptom management     - Dilaudid drip currently in place, titration and bolus orders updated     - Robinul for terminal secretions     - Ativan for agitation/anxiety PRN and ATC dosing     - Toradol ATC -Emotional support offered -Chaplain services triggered -Prognosis is likely days,  expect a hospital death  Time Spent: 15 Greater than 50% of the time was spent in counseling and coordination of care ______________________________________________________________________________________ [facility] Team Team Cell Phone: [phone] Please utilize secure chat with additional questions, if there is no response within 30 minutes please call the above phone number  Palliative Medicine Team providers are available by phone from 7am to 7pm daily and can be reached through the team cell phone.  Should this patient require assistance outside of these hours, please call the patient's attending physician.

## 2021-01-18 DIAGNOSIS — Z7189 Other specified counseling: Secondary | ICD-10-CM | POA: Diagnosis not present

## 2021-01-18 DIAGNOSIS — G9340 Encephalopathy, unspecified: Secondary | ICD-10-CM | POA: Diagnosis not present

## 2021-01-18 DIAGNOSIS — J9610 Chronic respiratory failure, unspecified whether with hypoxia or hypercapnia: Secondary | ICD-10-CM | POA: Diagnosis not present

## 2021-01-18 DIAGNOSIS — N179 Acute kidney failure, unspecified: Secondary | ICD-10-CM | POA: Diagnosis not present

## 2021-01-18 DIAGNOSIS — Z515 Encounter for palliative care: Secondary | ICD-10-CM | POA: Diagnosis not present

## 2021-01-18 NOTE — Progress Notes (Signed)
PROGRESS NOTE  Ronald Wolf  [id number] DOB: [birth year] DOA: 11/24/2020 PCP: Mosie Lukes, MD   Brief Narrative: Ronald Wolf is a 75 yo M w/ PMH of CVA, Seizures, Dementia, Bladder ca s/p urostomy, tobacco use, right glottic carcinoma s/p excision presenting with sbo. Found to have ischemic bowel requiring ex-lap12/7. Course complicated by septic shock, acute respiratory failure and AKI return to the OR 12/12 for necrotic small bowel resection of additional 70 cm inclusive of prior anastomosis 12/15 abdominal closure with ileostomy He has had a prolonged ICU stay with ventilator dependence, had a tracheostomy on 12/29 Remains in ICU for ventilator dependence and arrhythmias  Significant Hospital Events:  12/04/2020 Admission 11/30/2020 OR 11/29/2020 Admit to ICU 12/10 Brief SVT with associated hypertension. Self limited Given fentanyl, metop and hydral for HTN 12/11 Good urine output with Lasix , failed extubation, reintubated within 2 hours due to inability to clear secretions 12/12 return OR for enteric leak-- found to have bowel necrosis. Left in discontinuity  12/13 self extubated, reintubated. On pressors. Family meeting with PCCM and CCS to discuss possible next steps.  12/15-was back in OR, abdominal closure, RUQ mucus fistula and L ileostomy  12/19: No significant overnight events, high ileostomy output 12/21: scheduled for family meeting. Worse hypernatremia, continued high ileostomy output. Attempted PSV/CPAP wean, pulled volumes of high 100s -low/mid 200s with tachypnea. Placed back of full support.  12/22 ongoing high output GI/ ileostomy, TPN started; weaned 12/5 all day; off fentanyl gtt, remains on precedex  12/24 IR drain , 1 unit PRBC transfusion for hemoglobin 6.5 12/26 added vancomycin for GPC in blood cultures and RIC suggesting MRSA 12/29 tracheostomy 12/31 Palliative discussion > family understanding that his situation is critical and he has not made much improvement   1/6 ongoing palliative conversations. No progress from pt standpoint. 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  1/11 seen by surgery, IR consulted to remove percutaneous drain.  Added octreotide in effort to try to decrease output 1/12: More fatigued back on vent 1/14: Opens eyes to voice and grimaces to pain, ?tracking. Severely deconditioned and suspect long recovery requiring vent dependence at this point. Reconsult PT for developing contractures 1/15: Tolerated trach collar 1/16: Tolerated trach collar, improved alertness 1/17: Overnight had bradycardia. This morning 3 episodes of asystole <30-60s reported per nursing 1/20: visible discomfort, started on hydromorphone  1/21: family wishes for ongoing aggressive care and target destination care facility -- refused by [facility] due to poor prognosis. Prolongation of dying process again explained to family  1/24 palliative unable to reach family. Unable to try TC due to debilitation and poor tolerance of PSV 1/26: remains vent dependent -- intolerant of PSV for more than a couple hours thus unable to try TC. 1/27: palliative had multiple conversations with family.   1/28: vomited EN, EN held. WBC increase to 18 from 11. Daughter visited. Saw abdomen and was very emotional. 1/29 pt more lethargic.  1/31 GOC discussion with PMT resulted in compassionate extubation and comfort care.  2/3 Transfer to 5N on comfort measures 2/4 Uncomfortable, increase dilaudid infusion.  Assessment & Plan: Active Problems:   HTN (hypertension)   PEG (percutaneous endoscopic gastrostomy) adjustment/replacement/removal (  [place])   History of bladder  cancer   Seizure ([place])   History of stroke   Small bowel obstruction ([facility])   Hypernatremia   AKI (acute kidney injury) ([place])   Lactic acidosis   Protein-calorie malnutrition, severe   Encephalopathy acute   Respiratory failure ([place])   Shock ([place])   Pelvic abscess in male [facility])   Pressure injury of skin   Surgical abdomen   Status post tracheostomy [facility])   Advanced care planning/counseling discussion   Chronic respiratory failure requiring continuous mechanical ventilation through tracheostomy ([place])  SBO, Ischemic Bowel -s/p ex lap with SBR and G tube 12/7  -s/p takeback 12/12 with necrotic SBR. 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  - No family at bedside on my evaluation. - Continue IV ativan prn anxiety/agitation. - Patient not felt to be stable for transfer to hospice and this would likely pose undue stress to family. Will continue aggressive measures to alleviate suffering, maximize dignity as inpatient.  RN Pressure Injury Documentation: Pressure Injury 12/08/20 Coccyx Mid Stage 2 -  Partial thickness loss of dermis presenting as a shallow open injury with a red, pink wound bed without slough. (Active)  12/08/20 2000  Location: Coccyx  Location Orientation: Mid  Staging: Stage 2 -  Partial thickness loss of dermis presenting as a shallow open injury with a red, pink wound bed without slough.   Wound Description (Comments):   Present on Admission: No     Pressure Injury 12/25/20 Neck Medial Stage 2 -  Partial thickness loss of dermis presenting as a shallow open injury with a red, pink wound bed without slough. under trach faceplate (Active)  99/37/16 1500  Location: Neck  Location Orientation: Medial  Staging: Stage 2 -  Partial thickness loss of dermis presenting as a shallow open injury with a red, pink wound bed without slough.  Wound Description (Comments): under trach faceplate  Present on Admission: No     Pressure Injury 01/04/21 Heel Right Deep Tissue Pressure Injury - Purple or maroon localized area of discolored intact skin or blood-filled blister due to damage of underlying soft tissue from pressure and/or shear. (Active)  01/04/21 1200  Location: Heel  Location Orientation: Right  Staging: Deep Tissue Pressure Injury - Purple or maroon localized area of discolored intact skin or blood-filled blister due to damage of underlying soft tissue from pressure and/or shear.  Wound Description (Comments):   Present on Admission:      Pressure Injury 01/04/21 Heel Left Deep Tissue Pressure Injury - Purple or maroon localized area of discolored intact skin or blood-filled blister due to damage of underlying soft tissue from pressure and/or shear. (Active)  01/04/21 1200  Location: Heel  Location Orientation: Left  Staging: Deep Tissue Pressure Injury - Purple or maroon localized area of discolored intact skin or blood-filled blister due to damage of underlying soft tissue from pressure and/or shear.  Wound Description (Comments):   Present on Admission:     Severe protein-calorie malnutrition: Estimated body  mass index is 19.4 kg/m as calculated from the following:   Height as of this encounter: 5\' 11"  (1.803 m).   Weight as of this encounter: 63.1 kg.  DVT prophylaxis: None Code Status: A.N.D. Family Communication: None at bedside Disposition Plan: Anticipate  hospital death within hours. Does not appear to be stable for transfer to residential hospice.  Consultants:   Surgery  PCCM  Palliative  Procedures:  12/07/2020 Ex-lap, G-tube placement 12/7 PICC >> ETT 12/7 >> 12/11, >>1/31 12/15- back to OR for abdominal closure, exploratory laparotomy  12/24 CT-guided right pelvic abscess drain 12/29 trach  Antimicrobials: Fluc 12/8>> 12/10 Zosyn 12/8> 12/12 cefotetain 12/15>> off Zosyn 12/23 >> 12/30 eraxis 12/24 >> 1/2 Vancomycin 12/26 >>12/31 Unasyn 12/31 >1/9 zosyn 1/9>> vanc 1/9 >>off  Subjective: Not responsive.  Objective: Vitals:   01/17/21 2020 01/17/21 2321 01/18/21 0348 01/18/21 0823  BP: 124/63     Pulse: (!) 101 (!) 101 87 84  Resp: 16 12 12 10   Temp: 99.5 F (37.5 C)     TempSrc: Axillary     SpO2: (!) 89% (!) 81% 98% 90%  Weight:      Height:       No intake or output data in the 24 hours ending 01/18/21 0932 Filed Weights   01/10/21 0300 01/11/21 0600 01/12/21 0000  Weight: 66.1 kg 65.3 kg 63.1 kg   Gen: Calm Pulm: Coarse, normal rate, nonlabored CV: Regular, no edema GI: Nondistended with ostomy site without cellulitis. Ext: Warm, dry Skin: No new lesions on visualized skin Neuro: Eyes closed, does not interact Psych: UTD   Data Reviewed: I have personally reviewed following labs and imaging studies  CBC: No results for input(s): WBC, NEUTROABS, HGB, HCT, MCV, PLT in the last 168 hours. Basic Metabolic Panel: Recent Labs  Lab 01/12/21 0338  MG 2.2  PHOS 4.9*   CBG: Recent Labs  Lab 01/11/21 1919 01/11/21 2333 01/12/21 0339 01/12/21 0751 01/12/21 1126  GLUCAP 97 97 100* 90 92   Urine analysis:    Component Value Date/Time   COLORURINE YELLOW Dec 05, 2020 2102   APPEARANCEUR HAZY (A) 05-Dec-2020 2102   LABSPEC 1.013 12-05-20 2102   PHURINE 7.0 12-05-20 2102   GLUCOSEU NEGATIVE Dec 05, 2020 2102   GLUCOSEU NEGATIVE 08/21/2015 1412   HGBUR NEGATIVE 05-Dec-2020 2102   BILIRUBINUR  NEGATIVE 12/05/2020 2102   BILIRUBINUR negative 12/20/2017 1846   KETONESUR NEGATIVE 12/05/20 2102   PROTEINUR 100 (A) 05-Dec-2020 2102   UROBILINOGEN 0.2 12/20/2017 1846   UROBILINOGEN 0.2 08/21/2015 1412   NITRITE NEGATIVE 12-05-20 2102   LEUKOCYTESUR NEGATIVE Dec 05, 2020 2102    Scheduled Meds: . glycopyrrolate  0.4 mg Intravenous Q4H  . ketorolac  30 mg Intravenous Q6H  . LORazepam  1 mg Intravenous Q4H  . pantoprazole sodium  40 mg Per Tube QHS  . sodium chloride flush  10-40 mL Intracatheter Q12H  . sodium chloride flush  5 mL Intracatheter Q8H   Continuous Infusions: . sodium chloride 250 mL (01/17/21 1050)  . sodium chloride 10 mL/hr at 01/15/21 1100  . HYDROmorphone 7 mg/hr (01/18/21 0540)     LOS: 67 days   Time spent: 25 minutes.  Patrecia Pour, MD [facility] [email or web address] 01/18/2021, 9:32 AM

## 2021-01-18 NOTE — Plan of Care (Signed)
Family updated and verbalize comfort with plan of care.  Problem: Education: Goal: Knowledge of General Education information will improve Description: Including pain rating scale, medication(s)/side effects and non-pharmacologic comfort measures Outcome: Not Progressing   Problem: Health Behavior/Discharge Planning: Goal: Ability to manage health-related needs will improve Outcome: Not Progressing

## 2021-01-18 NOTE — Progress Notes (Addendum)
   Palliative Medicine Inpatient Follow Up Note  HPI: [name] is a 75 yo M w/ PMH of CVA, Seizures, Dementia, Bladder ca s/p urostomy, tobacco use, right glottic carcinoma s/p excision presenting with sbo. Found to have ischemic bowel requiring ex-lap12/7. Course complicated by septic shock, acute respiratory failure and AKI return to the OR 12/12 for necrotic small bowel resection of additional 70 cm inclusive of prior anastomosis on 12/15 abdominal closure with ileostomy. He has had a prolonged ICU stay with ventilator dependence, had a tracheostomy on 12/29 - compassionate extubation on 1/31.  Palliative care consulted to aid in goals of care conversations.   Today's Discussion (01/18/2021):  *Please note that this is a verbal dictation therefore any spelling or grammatical errors are due to the "[place] [employer]" system interpretation.   Chart reviewed.  I met with Ronald Wolf at bedside. He was noted to be wincing his eyes quite a bit and appeared generally uncomfortable. I bloused him and increased his dilaudid gtt. This did appear to settle down afterward though it took some time to see these results. I remained at bedside to ensure appropriate symptom relief was achieved. Was able to rub patients feet and calves during this time.   No family was present at bedside during this assessment.   Ongoing palliative support  Objective Assessment: Vital Signs Vitals:   01/18/21 0348 01/18/21 0823  BP:    Pulse: 87 84  Resp: 12 10  Temp:    SpO2: 98% 90%   No intake or output data in the 24 hours ending 01/18/21 0949 Last Weight  Most recent update: 01/12/2021  1:03 AM   Weight  63.1 kg (139 lb 1.8 oz)           Gen:  Elderly AA M in NAD HEENT: (+) trach with white secretions, dry mucous membranes CV: Regular rate and rhythm  PULM: On RA ABD: (+) drg in place  W/ ileostomy and g-tube EXT: No edema, (+) cyanosis in toes which are cool at the tips Neuro: Somnolent  SUMMARY OF  RECOMMENDATIONS   -DNR/DNI -Trach to room air -No artificial feeding or 49ml/hr NS for hydration (per family request) -No further diagnostics, lab draws -Symptom management     - Dilaudid drip currently in place, titration and bolus orders updated     - Robinul for terminal secretions     - Ativan for agitation/anxiety PRN and ATC dosing     - Toradol ATC -Emotional support offered -Chaplain services triggered -Prognosis is likely days,  expect a hospital death  Time Spent: 25 Greater than 50% of the time was spent in counseling and coordination of care ______________________________________________________________________________________ [place] Team Team Cell Phone: [phone] Please utilize secure chat with additional questions, if there is no response within 30 minutes please call the above phone number  Palliative Medicine Team providers are available by phone from 7am to 7pm daily and can be reached through the team cell phone.  Should this patient require assistance outside of these hours, please call the patient's attending physician.

## 2021-01-18 NOTE — Plan of Care (Addendum)
Patient passed at 63, daughter and sister notified, Dr. Manuella Ghazi notified, [place] [facility] notified.  Problem: Education: Goal: Knowledge of General Education information will improve Description: Including pain rating scale, medication(s)/side effects and non-pharmacologic comfort measures Outcome: Not Progressing   Problem: Activity: Goal: Risk for activity intolerance will decrease Outcome: Not Progressing   Problem: Pain Managment: Goal: General experience of comfort will improve Outcome: Not Progressing   Problem: Safety: Goal: Ability to remain free from injury will improve Outcome: Not Progressing   Problem: Skin Integrity: Goal: Risk for impaired skin integrity will decrease Outcome: Not Progressing

## 2021-02-10 NOTE — Death Summary Note (Signed)
DEATH SUMMARY   Patient Details  Name: Ronald Wolf MRN: [id number] DOB: 30-Jan-1946  Admission/Discharge Information   Admit Date:  November 22, 2020  Date of Death: Date of Death: Jan 29, 2021  Time of Death: Time of Death: 0645  Length of Stay: 2067-03-13  Referring Physician: Mosie Lukes, MD   Reason(s) for Hospitalization  Small bowel obstruction  Diagnoses  Preliminary cause of death:  Secondary Diagnoses (including complications and co-morbidities):  Active Problems:   HTN (hypertension)   PEG (percutaneous endoscopic gastrostomy) adjustment/replacement/removal (HCC)   History of bladder cancer   Seizure (HCC)   History of stroke   Small bowel obstruction (HCC)   Hypernatremia   AKI (acute kidney injury) ([place])   Lactic acidosis   Protein-calorie malnutrition, severe   Encephalopathy acute   Respiratory failure ([place])   Shock ([place])   Pelvic abscess in male ([place])   Pressure injury of skin   Surgical abdomen   Status post tracheostomy [facility])   Advanced care planning/counseling discussion   Chronic respiratory failure requiring continuous mechanical ventilation through tracheostomy ([place]) SBO, Ischemic Bowel -s/p ex lap with SBR and G tube 12/7  -s/p takeback 12/12 with necrotic SBR. 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 (Active)  12/08/20 2000  Location: Coccyx  Location Orientation: Mid  Staging: Stage 2 -  Partial thickness loss of dermis presenting as a shallow open injury with a red, pink wound bed without slough.  Wound Description (Comments):   Present on Admission: No     Pressure Injury 12/25/20 Neck Medial Stage 2 -  Partial thickness loss of dermis presenting as a shallow open injury with a red, pink wound bed without slough. under trach faceplate (Active)  10/75/69 1500  Location: Neck  Location Orientation: Medial  Staging: Stage 2 -  Partial thickness loss of dermis presenting as a shallow open injury with a red, pink wound bed without slough.  Wound Description (Comments): under trach faceplate  Present on Admission: No     Pressure Injury 01/04/21 Heel Right Deep Tissue Pressure Injury - Purple or maroon localized area of discolored intact skin or blood-filled blister due to damage of underlying soft tissue from pressure and/or shear. (Active)  01/04/21 1200  Location: Heel  Location Orientation: Right  Staging: Deep Tissue Pressure Injury - Purple or maroon localized area of discolored intact skin or blood-filled blister due to damage of underlying soft tissue from pressure and/or shear.  Wound Description (Comments):   Present on Admission:      Pressure Injury 01/04/21 Heel Left Deep Tissue Pressure Injury - Purple or maroon localized area of discolored intact skin or blood-filled blister due to damage of underlying soft tissue from pressure and/or shear. (Active)  01/04/21 1200  Location: Heel  Location Orientation: Left  Staging: Deep Tissue Pressure Injury - Purple or maroon localized area of discolored intact skin or blood-filled blister due  to damage of underlying soft tissue from pressure and/or shear.  Wound Description (Comments):   Present on Admission:     Severe protein-calorie malnutrition: Estimated body mass index is 19.4 kg/m    Brief Hospital Course (including significant findings, care, treatment, and services provided and events leading to death)  Ronald Wolf is a 75 yo M w/ PMH of CVA, Seizures, Dementia, Bladder ca s/p urostomy, tobacco use, right glottic carcinoma s/p excision presenting with sbo. Found to have ischemic bowel requiring ex-lap12/7. Course complicated by septic shock, acute respiratory failure and AKI return to the OR 12/12 for necrotic small bowel resection of additional 70 cm inclusive of prior anastomosis 12/15 abdominal closure with ileostomy He has had a prolonged ICU stay with ventilator dependence, had a tracheostomy on 12/29  Significant Hospital Events:  11/29/2020 Admission 12/05/2020 OR 11/21/2020 Admit to ICU 12/10 Brief SVT with associated hypertension. Self limited Given fentanyl, metop and hydral for HTN 12/11 Good urine output with Lasix , failed extubation, reintubated within 2 hours due to inability to clear secretions 12/12 return OR for enteric leak-- found to have bowel necrosis. Left in discontinuity  12/13 self extubated, reintubated. On pressors. Family meeting with PCCM and CCS to discuss possible next steps.  12/15-was back in OR, abdominal closure, RUQ mucus fistula and L ileostomy  12/19: No significant overnight events, high ileostomy output 12/21: scheduled for family meeting. Worse hypernatremia, continued high ileostomy output. Attempted PSV/CPAP wean, pulled volumes of high 100s -low/mid 200s with tachypnea. Placed back of full support.  12/22 ongoing high output GI/ ileostomy, TPN started; weaned 12/5 all day; off fentanyl gtt, remains on precedex  12/24 IR drain , 1 unit PRBC transfusion for hemoglobin 6.5 12/26 added vancomycin for GPC in blood cultures and RIC suggesting MRSA 12/29 tracheostomy 12/31 Palliative discussion >family understanding that his situation is critical and he has not made much improvement  1/6 ongoing palliative conversations. No progress  from pt standpoint. 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  1/11 seen by surgery, IR consulted to remove percutaneous drain. Added octreotide in effort to try to decrease output 1/12: More fatigued back on vent 1/14: Opens eyes to voice and grimaces to pain, ?tracking. Severely deconditioned and suspect long recovery requiring vent dependence at this point. Reconsult PT for developing contractures 1/15: Tolerated trach collar 1/16: Tolerated trach collar, improved alertness 1/17: Overnight had bradycardia. This morning 3 episodes of asystole <30-60s reported per nursing 1/20: visible discomfort, started on hydromorphone  1/21: family wishes for ongoing aggressive care and target destination care facility -- refused by [facility] due to poor prognosis. Prolongation of dying process again explained to family  1/24 palliative unable to reach family. Unable to try TC due to debilitation and poor tolerance of PSV 1/26: remains vent dependent -- intolerant of PSV for more than a couple hours thus unable to try TC. 1/27: palliative had multiple conversations with family.  1/28: vomited EN, EN held. WBC increase to 18 from 11. Daughter visited. Saw abdomen and was very emotional. 1/29 pt more lethargic. 1/31 GOC discussion with PMT resulted in compassionate extubation and comfort care. 2/3 Transfer to 5N on comfort measures 2/4  Uncomfortable, increase dilaudid infusion. 02/05/2023 Died in hospital  Pertinent Labs and Studies  Significant Diagnostic Studies DG Abd 1 View  Result Date: 01/09/2021 CLINICAL DATA:  Ileus EXAM: ABDOMEN - 1 VIEW COMPARISON:  12/30/2020 FINDINGS: Scattered gas  and stool in the colon. No small or large bowel distention. Gastrostomy tube in the left upper quadrant. Additional ostomy sites in the right upper quadrant and left lower quadrant. Surgical clips in the pelvis. Penile prosthesis. Vascular calcifications. Cardiac enlargement. Degenerative changes in the spine and hips. IMPRESSION: Nonobstructive bowel gas pattern.  Multiple postoperative changes. Electronically Signed   By: Lucienne Capers M.D.   On: 01/09/2021 00:39   DG ABDOMEN PEG TUBE LOCATION  Result Date: 12/30/2020 CLINICAL DATA:  Peg tube. EXAM: ABDOMEN - 1 VIEW COMPARISON:  12/21/2020 and prior. FINDINGS: Contrast opacifies a left hemiabdomen gastrostomy tube, the gastric lumen and the proximal duodenum. No evidence of extraluminal contrast. IMPRESSION: Sequela of left hemiabdomen gastrostomy with contrast opacifying the gastric lumen and proximal small bowel. No extraluminal contrast. Electronically Signed   By: Primitivo Gauze M.D.   On: 12/30/2020 14:24   DG CHEST PORT 1 VIEW  Result Date: 12/29/2020 CLINICAL DATA:  Hypoxemia.  Tracheostomy patient. EXAM: PORTABLE CHEST 1 VIEW COMPARISON:  Radiographs 12/21/2020 and 12/20/2020. CT 11/05/2014 and 12/21/2020. FINDINGS: 0841 hours. Stable mild patient rotation to the right. Tracheostomy and left arm PICC appear unchanged. The heart size and mediastinal contours are stable with aortic atherosclerosis. The overall aeration of the lung bases has slightly worsened, corresponding with bilateral pleural effusions and associated bibasilar airspace opacities on recent abdominal CT. The right upper lobe airspace disease seen on the most recent exam appears about the same. No evidence of pneumothorax or acute osseous abnormality. IMPRESSION: 1. Slight worsening of bibasilar aeration corresponding with bilateral pleural effusions and bibasilar airspace opacities on recent abdominal CT. 2. Stable right upper lobe airspace disease. Electronically Signed    By: Richardean Sale M.D.   On: 12/29/2020 08:49   IR GASTROSTOMY TUBE REMOVAL/REPAIR  Result Date: 12/31/2020 INDICATION: Poorly functioning feeding gastrostomy tube. Please perform fluoroscopic guided exchange. EXAM: FLUOROSCOPIC GUIDED REPLACEMENT OF GASTROSTOMY TUBE COMPARISON:  Abdominal radiograph with gastrostomy injection-10/30/2021. MEDICATIONS: None. CONTRAST:  30mL OMNIPAQUE IOHEXOL 300 MG/ML SOLN administered into the gastric lumen FLUOROSCOPY TIME:  6 seconds (1 mGy). COMPLICATIONS: None immediate. PROCEDURE: A timeout was performed prior to the initiation of the procedure. Existing gastrostomy tube was deflated and removed intact Next, a new 51 [place] into it balloon retention gastrostomy tube was inserted via the chronic gastrostomy tube track. The balloon was inflated with approximately 10 cc of saline and the external disc was cinched. Contrast was injected and several spot fluoroscopic images were obtained in various obliquities to confirm appropriate intraluminal positioning. A dressing was applied. The patient tolerated the procedure well without immediate postprocedural complication. IMPRESSION: Successful fluoroscopic guided replacement of a new 18-French gastrostomy tube. The new gastrostomy tube is ready for immediate use. Electronically Signed   By: Sandi Mariscal M.D.   On: 12/31/2020 14:23   Procedures/Operations  11/13/2020 Ex-lap, G-tube placement 12/7 PICC >> ETT 12/7 >> 12/11, >>1/31 12/15- back to OR for abdominal closure, exploratory laparotomy  12/24 CT-guided right pelvic abscess drain 12/29 trach  Patrecia Pour, MD 01/27/2021, 11:43 AM

## 2021-02-10 DEATH — deceased

## 2022-05-13 IMAGING — DX DG ABD PORTABLE 1V
1 series · 1 of 1 positions shown · non-contrast
Comparison: Film from earlier in the same day.

CLINICAL DATA: Follow up small bowel obstruction

EXAM:
PORTABLE ABDOMEN - 1 VIEW

[abdomen kub]
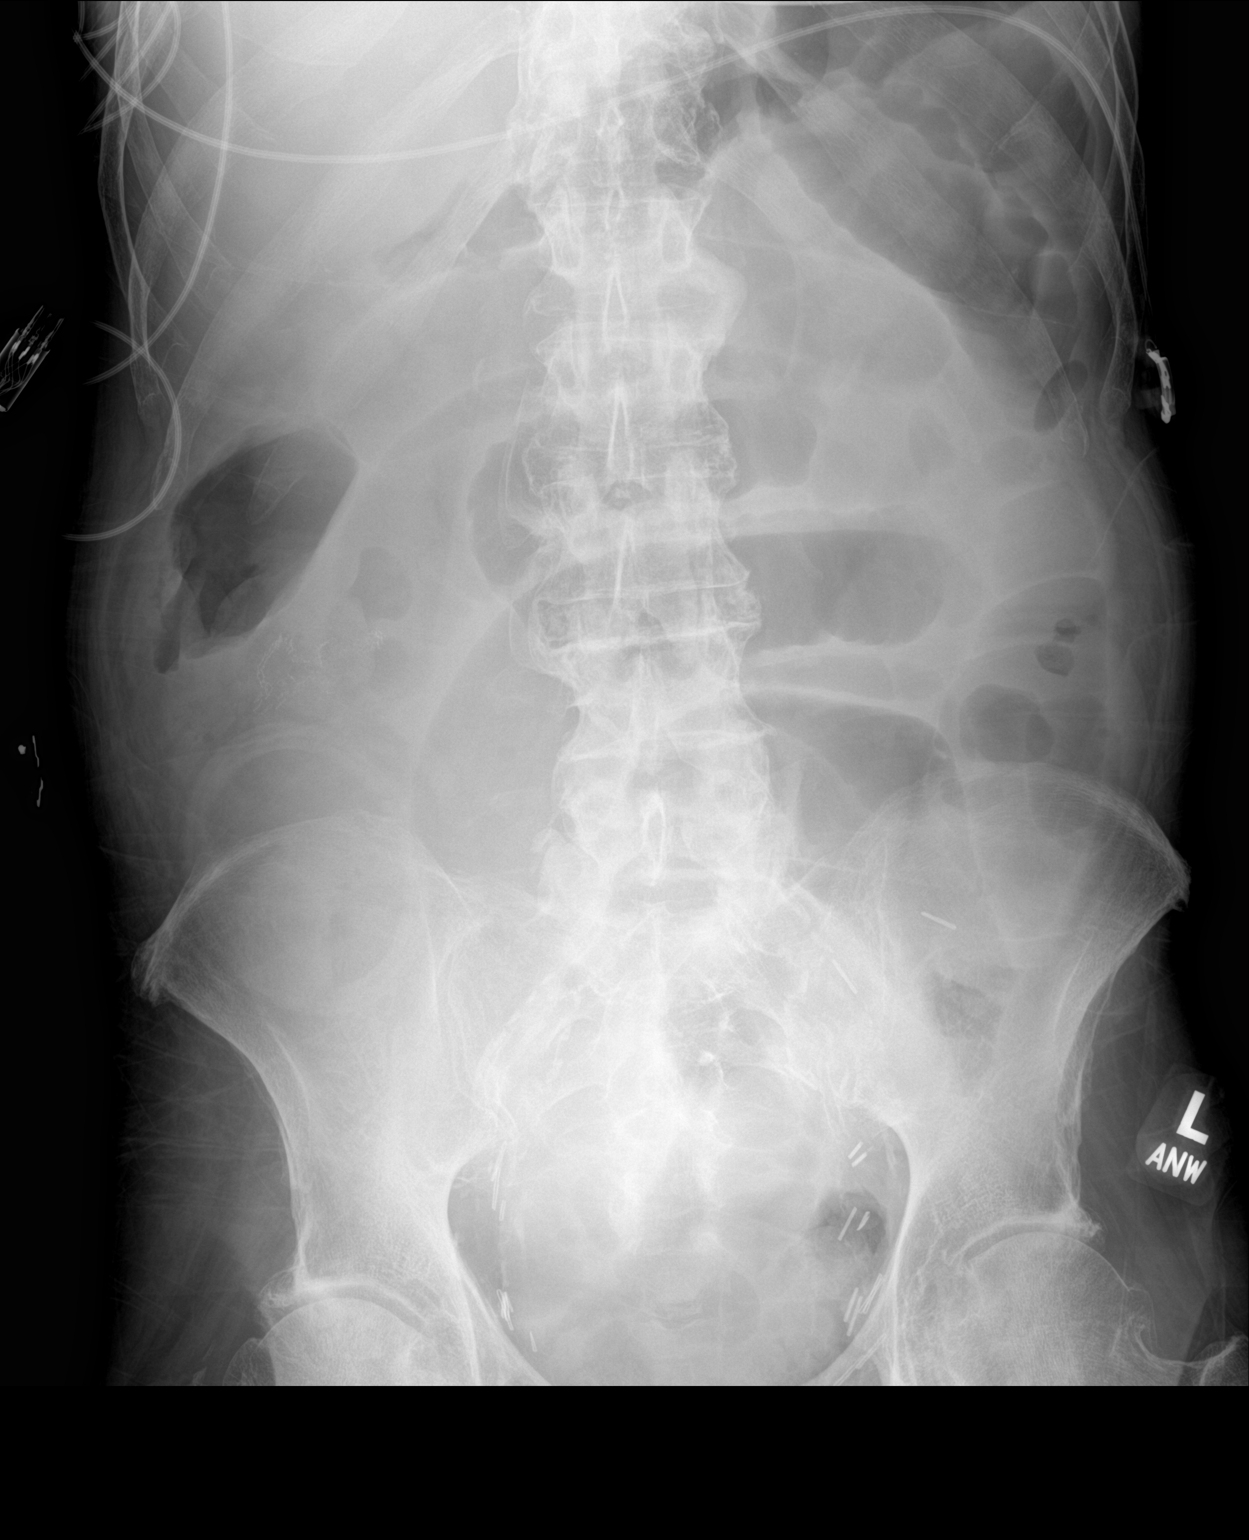

[1 of 1 positions shown; findings below may reference images not displayed]

FINDINGS: Scattered large and small bowel gas is noted. Spur system
small-bowel dilatation is again seen similar to the film from 1 hour
previous. Gastric catheter appears to been removed in the interval.
IMPRESSION: Persistent small bowel dilatation.

## 2022-05-16 IMAGING — DX DG CHEST 1V PORT
1 series · 1 of 1 positions shown · non-contrast
Comparison: 9107 hours today and earlier.

CLINICAL DATA: 74-year-old male with hypoxia. Abnormal right lung
on portable chest earlier today.

EXAM:
PORTABLE CHEST 1 VIEW

[chest ap]
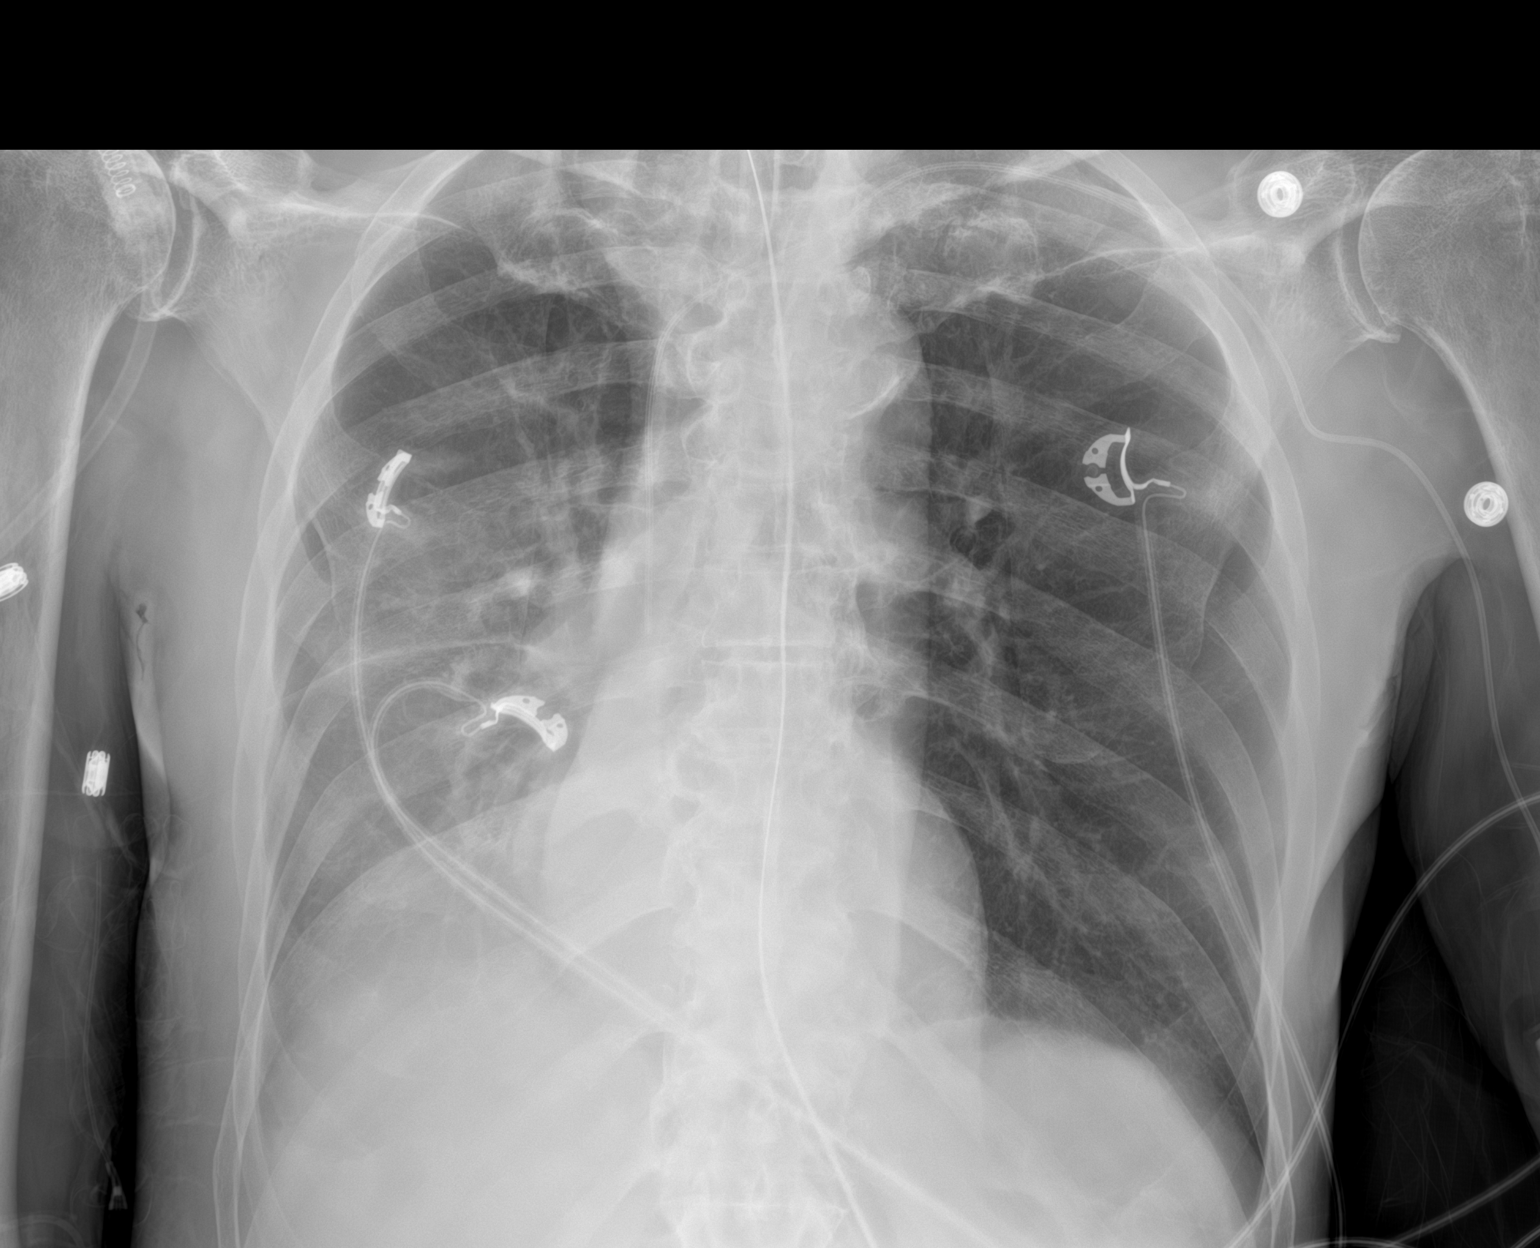

[1 of 1 positions shown; findings below may reference images not displayed]

FINDINGS: Portable AP semi upright view at 7079 hours. Improved right lung
volume and ventilation from earlier. Endotracheal tube tip remains
just below the clavicles. Enteric tube courses to the abdomen, tip
not included.

Residual veiling opacity in the right lower lung. Allowing for
portable technique left lung remains clear.

New left upper extremity approach PICC line in place, tip at the
lower SVC level below the carina.
IMPRESSION: 1. Improved right lung ventilation probably due to resolved mucous
plugging from 9107 hours today. Residual veiling right pleural
effusion suspected.
2. New left upper extremity approach PICC line with tip at the lower
SVC level. Otherwise stable lines and tubes.
3. Left lung remains clear.

## 2022-05-16 IMAGING — DX DG CHEST 1V PORT
1 series · 2 of 2 positions shown · non-contrast
Comparison: CT abdomen 11/13/2020, chest x-ray 12/20/2019

CLINICAL DATA: Respiratory failure

EXAM:
PORTABLE CHEST 1 VIEW

[Series 1: chest ap · 0.14mm/px · 2 of 2 slices shown]
[im 1/2]
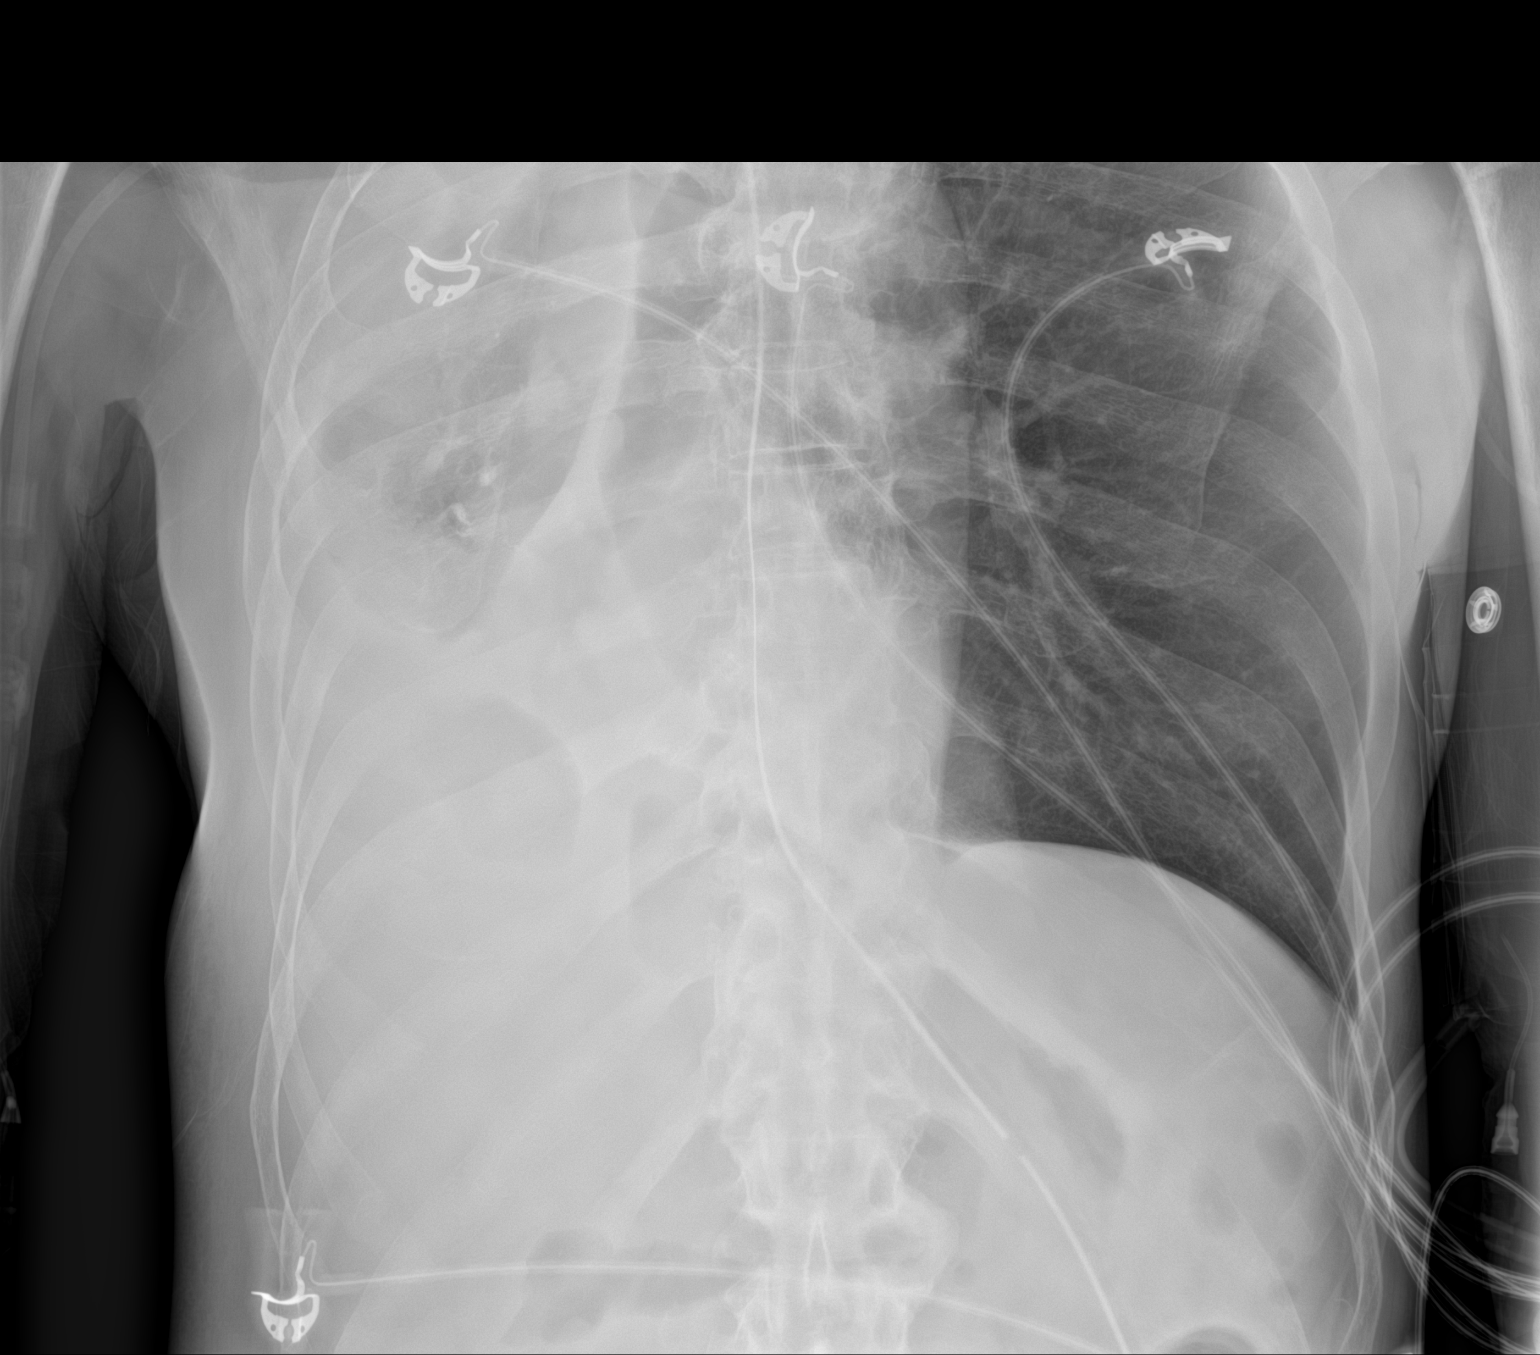
[im 2/2]
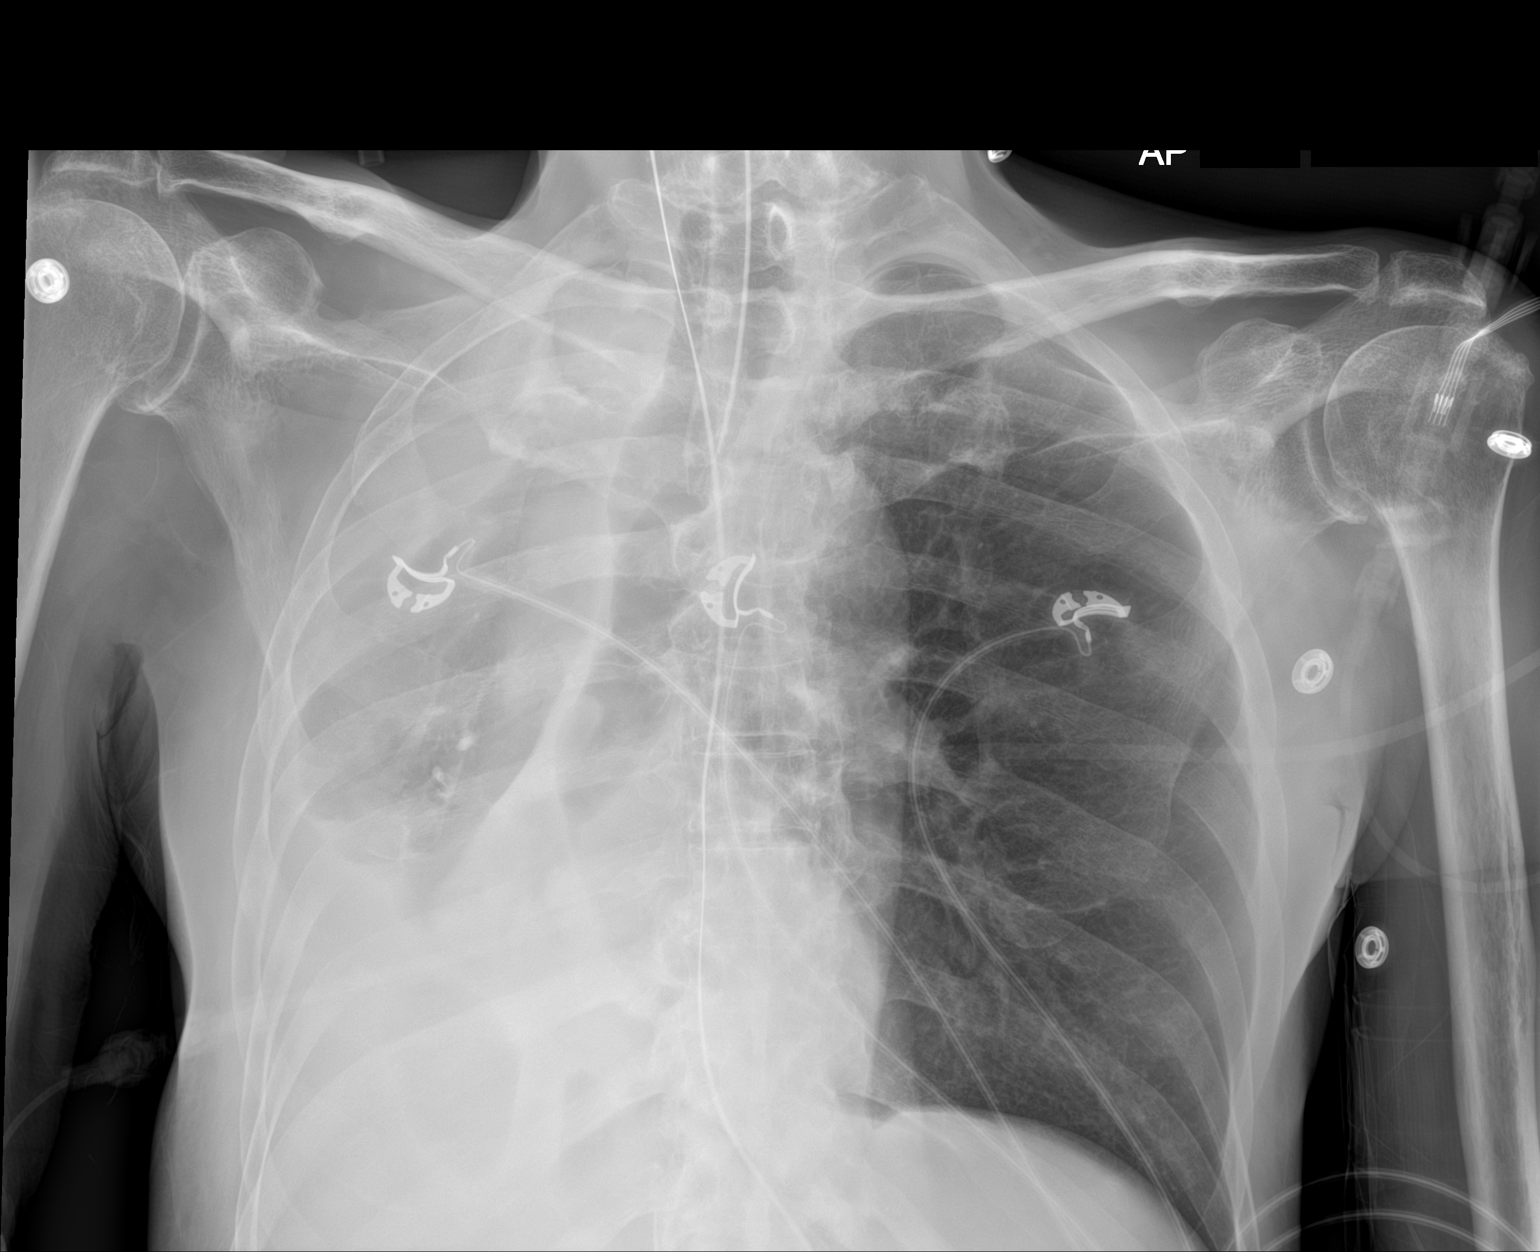

[2 of 2 positions shown; findings below may reference images not displayed]

FINDINGS: Endotracheal tube tip is about 4.6 cm superior to the carina.
Esophageal tube tip below the diaphragm but incompletely visualized.
Diffuse opacity in the right thorax with mild shift of mediastinal
contents to the right, suspect combination of pleural effusion,
atelectasis and volume loss. Left lung is grossly clear. The
cardiomediastinal silhouette is obscured.
IMPRESSION: 1. Endotracheal tube tip about 4.6 cm superior to the carina.
Esophageal tube tip below the diaphragm, incompletely visualized.
2. Diffuse opacity in the right thorax with shift of mediastinal
contents to the right, suspect combination of pleural effusion,
atelectasis and volume loss. A central obstructing process could
also produce this appearance and chest CT follow-up may be
considered.

## 2022-05-20 IMAGING — DX DG CHEST 1V PORT
1 series · 1 of 1 positions shown · non-contrast
Comparison: November 21, 2020.

CLINICAL DATA: Intubation.

EXAM:
PORTABLE CHEST 1 VIEW

[chest ap]
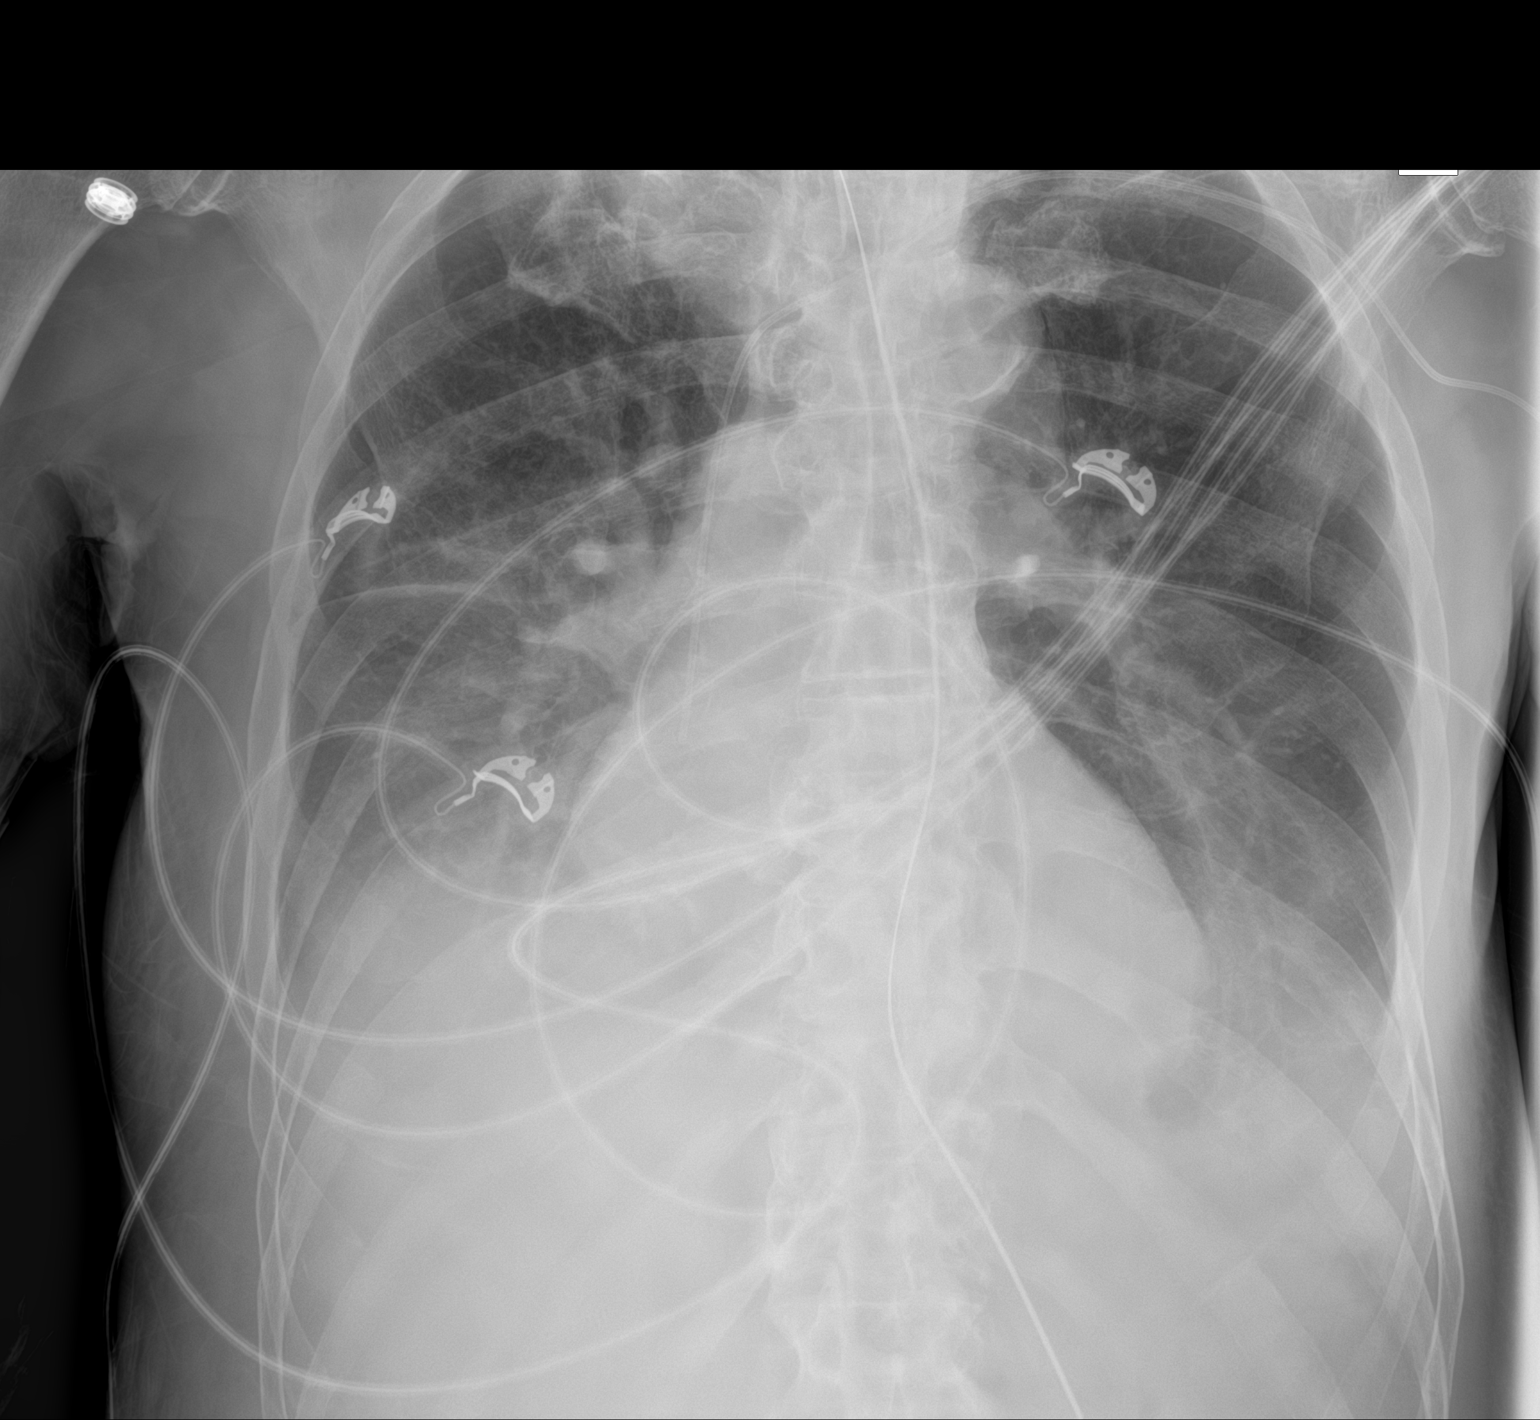

[1 of 1 positions shown; findings below may reference images not displayed]

FINDINGS: The heart size and mediastinal contours are within normal limits.
Endotracheal and nasogastric tubes are in good position. Left
subclavian catheter is noted with distal tip in expected position of
cavoatrial junction. No pneumothorax is noted. Bibasilar atelectasis
or infiltrates are noted with associated pleural effusions. The
visualized skeletal structures are unremarkable.
IMPRESSION: Endotracheal and nasogastric tubes in good position. Bibasilar
atelectasis or infiltrates are noted with associated pleural
effusions.

## 2022-05-22 IMAGING — DX DG CHEST 1V PORT
1 series · 1 of 1 positions shown · non-contrast
Comparison: 11/22/2020

CLINICAL DATA: Intubation.

EXAM:
PORTABLE CHEST 1 VIEW

[chest ap]
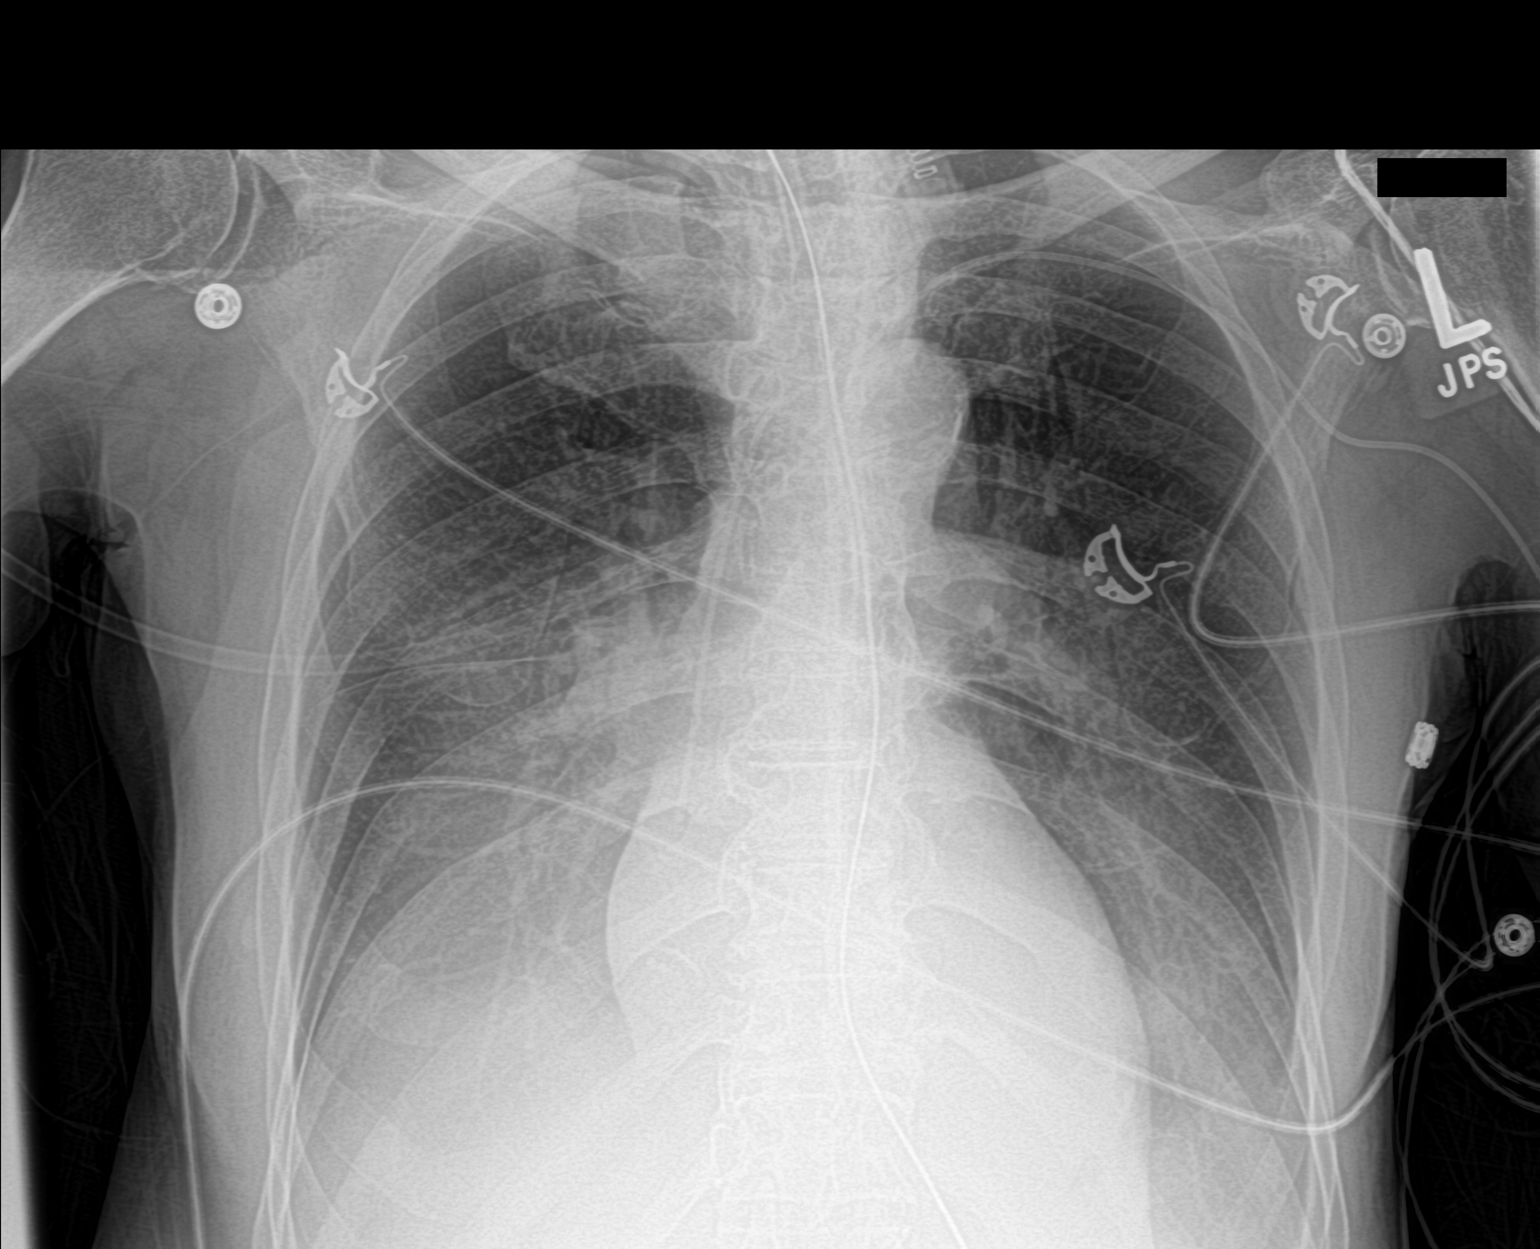

[1 of 1 positions shown; findings below may reference images not displayed]

FINDINGS: The endotracheal tube terminates at the level of the clavicular
heads, well above the carina. A left PICC terminates near the
superior cavoatrial junction. An enteric tube courses towards the
upper abdomen with tip not imaged. The cardiac silhouette is normal
in size. Veiling opacities in the lower lungs bilaterally likely
reflect a combination of small pleural effusions and atelectasis
without suspected significant interval change allowing for
differences in patient positioning. No pneumothorax is identified.
IMPRESSION: Unchanged small pleural effusions and bibasilar atelectasis.
